# Patient Record
Sex: Female | Born: 1950 | Race: White | Hispanic: No | Smoking: Never smoker
Health system: Southern US, Community
[De-identification: ages and names within clinical notes are randomized; demographics above are authoritative.]

## PROBLEM LIST (undated history)

## (undated) DIAGNOSIS — E119 Type 2 diabetes mellitus without complications: Secondary | ICD-10-CM

## (undated) DIAGNOSIS — I10 Essential (primary) hypertension: Secondary | ICD-10-CM

## (undated) DIAGNOSIS — Z9889 Other specified postprocedural states: Secondary | ICD-10-CM

## (undated) DIAGNOSIS — T7840XA Allergy, unspecified, initial encounter: Secondary | ICD-10-CM

## (undated) DIAGNOSIS — I8390 Asymptomatic varicose veins of unspecified lower extremity: Secondary | ICD-10-CM

## (undated) DIAGNOSIS — M1990 Unspecified osteoarthritis, unspecified site: Secondary | ICD-10-CM

## (undated) DIAGNOSIS — E785 Hyperlipidemia, unspecified: Secondary | ICD-10-CM

## (undated) DIAGNOSIS — R51 Headache: Secondary | ICD-10-CM

## (undated) DIAGNOSIS — R5381 Other malaise: Secondary | ICD-10-CM

## (undated) DIAGNOSIS — G039 Meningitis, unspecified: Secondary | ICD-10-CM

## (undated) DIAGNOSIS — Z87440 Personal history of urinary (tract) infections: Secondary | ICD-10-CM

## (undated) DIAGNOSIS — R011 Cardiac murmur, unspecified: Secondary | ICD-10-CM

## (undated) DIAGNOSIS — IMO0001 Reserved for inherently not codable concepts without codable children: Secondary | ICD-10-CM

## (undated) DIAGNOSIS — J019 Acute sinusitis, unspecified: Secondary | ICD-10-CM

## (undated) DIAGNOSIS — R0602 Shortness of breath: Secondary | ICD-10-CM

## (undated) DIAGNOSIS — M4802 Spinal stenosis, cervical region: Secondary | ICD-10-CM

## (undated) DIAGNOSIS — J45909 Unspecified asthma, uncomplicated: Secondary | ICD-10-CM

## (undated) DIAGNOSIS — G709 Myoneural disorder, unspecified: Secondary | ICD-10-CM

## (undated) DIAGNOSIS — M549 Dorsalgia, unspecified: Secondary | ICD-10-CM

## (undated) DIAGNOSIS — F411 Generalized anxiety disorder: Secondary | ICD-10-CM

## (undated) DIAGNOSIS — Z8709 Personal history of other diseases of the respiratory system: Secondary | ICD-10-CM

## (undated) DIAGNOSIS — G9600 Cerebrospinal fluid leak, unspecified: Secondary | ICD-10-CM

## (undated) DIAGNOSIS — R29898 Other symptoms and signs involving the musculoskeletal system: Secondary | ICD-10-CM

## (undated) DIAGNOSIS — G971 Other reaction to spinal and lumbar puncture: Secondary | ICD-10-CM

## (undated) DIAGNOSIS — G8929 Other chronic pain: Secondary | ICD-10-CM

## (undated) DIAGNOSIS — R1310 Dysphagia, unspecified: Secondary | ICD-10-CM

## (undated) DIAGNOSIS — R112 Nausea with vomiting, unspecified: Secondary | ICD-10-CM

## (undated) DIAGNOSIS — I499 Cardiac arrhythmia, unspecified: Secondary | ICD-10-CM

## (undated) DIAGNOSIS — Z8489 Family history of other specified conditions: Secondary | ICD-10-CM

## (undated) DIAGNOSIS — C801 Malignant (primary) neoplasm, unspecified: Secondary | ICD-10-CM

## (undated) DIAGNOSIS — R5383 Other fatigue: Secondary | ICD-10-CM

## (undated) DIAGNOSIS — D649 Anemia, unspecified: Secondary | ICD-10-CM

## (undated) DIAGNOSIS — K219 Gastro-esophageal reflux disease without esophagitis: Secondary | ICD-10-CM

## (undated) DIAGNOSIS — Z5189 Encounter for other specified aftercare: Secondary | ICD-10-CM

## (undated) HISTORY — PX: APPENDECTOMY: SHX54

## (undated) HISTORY — PX: KNEE ARTHROSCOPY: SUR90

## (undated) HISTORY — DX: Headache: R51

## (undated) HISTORY — PX: TUBAL LIGATION: SHX77

## (undated) HISTORY — DX: Allergy, unspecified, initial encounter: T78.40XA

## (undated) HISTORY — PX: BACK SURGERY: SHX140

## (undated) HISTORY — DX: Acute sinusitis, unspecified: J01.90

## (undated) HISTORY — DX: Hyperlipidemia, unspecified: E78.5

## (undated) HISTORY — PX: CARPAL TUNNEL RELEASE: SHX101

## (undated) HISTORY — DX: Essential (primary) hypertension: I10

## (undated) HISTORY — DX: Generalized anxiety disorder: F41.1

## (undated) HISTORY — DX: Other malaise: R53.81

## (undated) HISTORY — PX: BREAST EXCISIONAL BIOPSY: SUR124

## (undated) HISTORY — PX: BREAST SURGERY: SHX581

## (undated) HISTORY — DX: Asymptomatic varicose veins of unspecified lower extremity: I83.90

## (undated) HISTORY — DX: Other fatigue: R53.83

---

## 1985-06-19 HISTORY — PX: ABDOMINAL HYSTERECTOMY: SHX81

## 1992-06-19 HISTORY — PX: CHOLECYSTECTOMY: SHX55

## 1998-01-01 ENCOUNTER — Ambulatory Visit (HOSPITAL_COMMUNITY): Admission: RE | Admit: 1998-01-01 | Discharge: 1998-01-01 | Payer: Self-pay | Admitting: Neurosurgery

## 1998-05-18 ENCOUNTER — Encounter: Payer: Self-pay | Admitting: Neurosurgery

## 1998-05-18 ENCOUNTER — Ambulatory Visit (HOSPITAL_COMMUNITY): Admission: RE | Admit: 1998-05-18 | Discharge: 1998-05-18 | Payer: Self-pay | Admitting: Neurosurgery

## 1998-06-19 HISTORY — PX: LUMBAR FUSION: SHX111

## 1999-03-08 ENCOUNTER — Encounter: Payer: Self-pay | Admitting: Neurosurgery

## 1999-03-08 ENCOUNTER — Ambulatory Visit (HOSPITAL_COMMUNITY): Admission: RE | Admit: 1999-03-08 | Discharge: 1999-03-08 | Payer: Self-pay | Admitting: Neurosurgery

## 1999-05-20 ENCOUNTER — Encounter: Payer: Self-pay | Admitting: Neurosurgery

## 1999-05-24 ENCOUNTER — Inpatient Hospital Stay (HOSPITAL_COMMUNITY): Admission: RE | Admit: 1999-05-24 | Discharge: 1999-05-31 | Payer: Self-pay | Admitting: Neurosurgery

## 1999-05-24 ENCOUNTER — Encounter: Payer: Self-pay | Admitting: Neurosurgery

## 1999-06-23 ENCOUNTER — Encounter: Admission: RE | Admit: 1999-06-23 | Discharge: 1999-06-23 | Payer: Self-pay | Admitting: Neurosurgery

## 1999-06-23 ENCOUNTER — Encounter: Payer: Self-pay | Admitting: Neurosurgery

## 1999-07-12 ENCOUNTER — Encounter: Admission: RE | Admit: 1999-07-12 | Discharge: 1999-07-12 | Payer: Self-pay | Admitting: Neurosurgery

## 1999-07-12 ENCOUNTER — Encounter: Payer: Self-pay | Admitting: Neurosurgery

## 1999-08-09 ENCOUNTER — Ambulatory Visit (HOSPITAL_COMMUNITY): Admission: RE | Admit: 1999-08-09 | Discharge: 1999-08-09 | Payer: Self-pay | Admitting: Neurosurgery

## 1999-08-29 ENCOUNTER — Encounter: Payer: Self-pay | Admitting: Neurosurgery

## 1999-08-29 ENCOUNTER — Encounter: Admission: RE | Admit: 1999-08-29 | Discharge: 1999-08-29 | Payer: Self-pay | Admitting: Neurosurgery

## 1999-10-18 ENCOUNTER — Encounter: Payer: Self-pay | Admitting: Neurosurgery

## 1999-10-18 ENCOUNTER — Ambulatory Visit (HOSPITAL_COMMUNITY): Admission: RE | Admit: 1999-10-18 | Discharge: 1999-10-18 | Payer: Self-pay | Admitting: Neurosurgery

## 1999-11-04 ENCOUNTER — Encounter: Payer: Self-pay | Admitting: Neurosurgery

## 1999-11-04 ENCOUNTER — Ambulatory Visit (HOSPITAL_COMMUNITY): Admission: RE | Admit: 1999-11-04 | Discharge: 1999-11-04 | Payer: Self-pay | Admitting: Neurosurgery

## 1999-12-02 ENCOUNTER — Ambulatory Visit (HOSPITAL_COMMUNITY): Admission: RE | Admit: 1999-12-02 | Discharge: 1999-12-02 | Payer: Self-pay | Admitting: Neurosurgery

## 1999-12-02 ENCOUNTER — Encounter: Payer: Self-pay | Admitting: Neurosurgery

## 1999-12-05 ENCOUNTER — Encounter: Admission: RE | Admit: 1999-12-05 | Discharge: 1999-12-05 | Payer: Self-pay | Admitting: Neurosurgery

## 1999-12-05 ENCOUNTER — Encounter: Payer: Self-pay | Admitting: Neurosurgery

## 2000-01-13 ENCOUNTER — Ambulatory Visit (HOSPITAL_COMMUNITY): Admission: RE | Admit: 2000-01-13 | Discharge: 2000-01-13 | Payer: Self-pay | Admitting: Neurosurgery

## 2000-01-13 ENCOUNTER — Encounter: Payer: Self-pay | Admitting: Neurosurgery

## 2000-08-27 ENCOUNTER — Encounter: Payer: Self-pay | Admitting: Neurosurgery

## 2000-08-27 ENCOUNTER — Ambulatory Visit (HOSPITAL_COMMUNITY): Admission: RE | Admit: 2000-08-27 | Discharge: 2000-08-27 | Payer: Self-pay | Admitting: Neurosurgery

## 2001-04-24 ENCOUNTER — Encounter: Payer: Self-pay | Admitting: Neurosurgery

## 2001-04-24 ENCOUNTER — Encounter: Admission: RE | Admit: 2001-04-24 | Discharge: 2001-04-24 | Payer: Self-pay | Admitting: Neurosurgery

## 2001-04-30 ENCOUNTER — Ambulatory Visit (HOSPITAL_COMMUNITY): Admission: RE | Admit: 2001-04-30 | Discharge: 2001-04-30 | Payer: Self-pay | Admitting: Neurosurgery

## 2001-04-30 ENCOUNTER — Encounter: Payer: Self-pay | Admitting: Neurosurgery

## 2001-12-17 ENCOUNTER — Encounter (HOSPITAL_COMMUNITY): Admission: RE | Admit: 2001-12-17 | Discharge: 2002-01-16 | Payer: Self-pay | Admitting: Neurosurgery

## 2002-01-15 ENCOUNTER — Encounter: Admission: RE | Admit: 2002-01-15 | Discharge: 2002-03-18 | Payer: Self-pay

## 2002-04-07 ENCOUNTER — Encounter: Admission: RE | Admit: 2002-04-07 | Discharge: 2002-07-06 | Payer: Self-pay

## 2002-08-14 ENCOUNTER — Encounter: Admission: RE | Admit: 2002-08-14 | Discharge: 2002-11-12 | Payer: Self-pay

## 2003-01-19 ENCOUNTER — Encounter
Admission: RE | Admit: 2003-01-19 | Discharge: 2003-04-19 | Payer: Self-pay | Admitting: Physical Medicine & Rehabilitation

## 2003-01-28 ENCOUNTER — Encounter: Payer: Self-pay | Admitting: Physical Medicine & Rehabilitation

## 2003-01-28 ENCOUNTER — Ambulatory Visit (HOSPITAL_COMMUNITY)
Admission: RE | Admit: 2003-01-28 | Discharge: 2003-01-28 | Payer: Self-pay | Admitting: Physical Medicine & Rehabilitation

## 2003-04-03 ENCOUNTER — Encounter: Payer: Self-pay | Admitting: Radiology

## 2003-04-03 ENCOUNTER — Encounter: Payer: Self-pay | Admitting: Neurosurgery

## 2003-04-03 ENCOUNTER — Encounter: Admission: RE | Admit: 2003-04-03 | Discharge: 2003-04-03 | Payer: Self-pay | Admitting: Neurosurgery

## 2003-06-03 ENCOUNTER — Ambulatory Visit (HOSPITAL_COMMUNITY): Admission: RE | Admit: 2003-06-03 | Discharge: 2003-06-03 | Payer: Self-pay | Admitting: Neurosurgery

## 2003-06-20 HISTORY — PX: CERVICAL DISC SURGERY: SHX588

## 2003-08-04 ENCOUNTER — Inpatient Hospital Stay (HOSPITAL_COMMUNITY): Admission: RE | Admit: 2003-08-04 | Discharge: 2003-08-05 | Payer: Self-pay | Admitting: Neurosurgery

## 2005-01-04 ENCOUNTER — Ambulatory Visit (HOSPITAL_COMMUNITY): Admission: RE | Admit: 2005-01-04 | Discharge: 2005-01-04 | Payer: Self-pay | Admitting: Neurosurgery

## 2005-06-19 HISTORY — PX: ANTERIOR FUSION CERVICAL SPINE: SUR626

## 2006-02-22 ENCOUNTER — Encounter: Admission: RE | Admit: 2006-02-22 | Discharge: 2006-02-22 | Payer: Self-pay | Admitting: General Surgery

## 2006-03-12 ENCOUNTER — Ambulatory Visit (HOSPITAL_COMMUNITY): Admission: RE | Admit: 2006-03-12 | Discharge: 2006-03-12 | Payer: Self-pay | Admitting: Neurosurgery

## 2006-05-25 ENCOUNTER — Inpatient Hospital Stay (HOSPITAL_COMMUNITY): Admission: RE | Admit: 2006-05-25 | Discharge: 2006-05-28 | Payer: Self-pay | Admitting: Neurosurgery

## 2006-08-21 ENCOUNTER — Encounter: Admission: RE | Admit: 2006-08-21 | Discharge: 2006-08-21 | Payer: Self-pay | Admitting: Internal Medicine

## 2007-01-15 ENCOUNTER — Encounter: Admission: RE | Admit: 2007-01-15 | Discharge: 2007-01-15 | Payer: Self-pay | Admitting: Neurosurgery

## 2007-01-17 ENCOUNTER — Encounter: Admission: RE | Admit: 2007-01-17 | Discharge: 2007-01-17 | Payer: Self-pay | Admitting: Neurosurgery

## 2007-01-21 ENCOUNTER — Encounter: Admission: RE | Admit: 2007-01-21 | Discharge: 2007-01-21 | Payer: Self-pay | Admitting: Interventional Radiology

## 2007-01-21 ENCOUNTER — Ambulatory Visit: Admission: RE | Admit: 2007-01-21 | Discharge: 2007-01-21 | Payer: Self-pay | Admitting: Neurosurgery

## 2007-01-21 ENCOUNTER — Encounter: Admission: RE | Admit: 2007-01-21 | Discharge: 2007-01-21 | Payer: Self-pay | Admitting: Neurosurgery

## 2007-01-22 ENCOUNTER — Encounter: Admission: RE | Admit: 2007-01-22 | Discharge: 2007-01-22 | Payer: Self-pay | Admitting: Interventional Radiology

## 2007-04-23 ENCOUNTER — Ambulatory Visit (HOSPITAL_COMMUNITY): Admission: RE | Admit: 2007-04-23 | Discharge: 2007-04-23 | Payer: Self-pay | Admitting: Neurosurgery

## 2007-08-22 ENCOUNTER — Encounter: Admission: RE | Admit: 2007-08-22 | Discharge: 2007-08-22 | Payer: Self-pay | Admitting: Internal Medicine

## 2008-06-19 HISTORY — PX: TENDON REPAIR: SHX5111

## 2008-07-01 ENCOUNTER — Ambulatory Visit (HOSPITAL_BASED_OUTPATIENT_CLINIC_OR_DEPARTMENT_OTHER): Admission: RE | Admit: 2008-07-01 | Discharge: 2008-07-01 | Payer: Self-pay | Admitting: Orthopedic Surgery

## 2008-08-27 ENCOUNTER — Encounter: Admission: RE | Admit: 2008-08-27 | Discharge: 2008-08-27 | Payer: Self-pay | Admitting: Internal Medicine

## 2008-10-28 ENCOUNTER — Encounter: Admission: RE | Admit: 2008-10-28 | Discharge: 2008-10-28 | Payer: Self-pay | Admitting: Neurosurgery

## 2009-02-24 ENCOUNTER — Ambulatory Visit (HOSPITAL_BASED_OUTPATIENT_CLINIC_OR_DEPARTMENT_OTHER): Admission: RE | Admit: 2009-02-24 | Discharge: 2009-02-25 | Payer: Self-pay | Admitting: Orthopedic Surgery

## 2009-04-08 ENCOUNTER — Emergency Department (HOSPITAL_COMMUNITY): Admission: EM | Admit: 2009-04-08 | Discharge: 2009-04-08 | Payer: Self-pay | Admitting: Emergency Medicine

## 2009-05-07 ENCOUNTER — Encounter: Admission: RE | Admit: 2009-05-07 | Discharge: 2009-05-07 | Payer: Self-pay | Admitting: Neurosurgery

## 2009-06-10 ENCOUNTER — Encounter: Admission: RE | Admit: 2009-06-10 | Discharge: 2009-06-10 | Payer: Self-pay | Admitting: Neurosurgery

## 2009-07-13 ENCOUNTER — Encounter: Admission: RE | Admit: 2009-07-13 | Discharge: 2009-07-13 | Payer: Self-pay | Admitting: Neurosurgery

## 2009-09-16 ENCOUNTER — Encounter: Admission: RE | Admit: 2009-09-16 | Discharge: 2009-09-16 | Payer: Self-pay | Admitting: Internal Medicine

## 2009-11-17 ENCOUNTER — Ambulatory Visit: Payer: Self-pay | Admitting: Vascular Surgery

## 2009-12-22 ENCOUNTER — Emergency Department (HOSPITAL_COMMUNITY): Admission: EM | Admit: 2009-12-22 | Discharge: 2009-12-22 | Payer: Self-pay | Admitting: Emergency Medicine

## 2010-02-24 HISTORY — PX: OTHER SURGICAL HISTORY: SHX169

## 2010-04-29 ENCOUNTER — Ambulatory Visit (HOSPITAL_COMMUNITY): Admission: RE | Admit: 2010-04-29 | Discharge: 2010-04-29 | Payer: Self-pay | Admitting: Neurosurgery

## 2010-05-19 HISTORY — PX: LUMBAR LAMINECTOMY: SHX95

## 2010-05-27 ENCOUNTER — Ambulatory Visit (HOSPITAL_COMMUNITY)
Admission: RE | Admit: 2010-05-27 | Discharge: 2010-05-27 | Payer: Self-pay | Source: Home / Self Care | Attending: Neurosurgery | Admitting: Neurosurgery

## 2010-06-01 ENCOUNTER — Inpatient Hospital Stay (HOSPITAL_COMMUNITY)
Admission: RE | Admit: 2010-06-01 | Discharge: 2010-06-04 | Payer: Self-pay | Source: Home / Self Care | Attending: Neurosurgery | Admitting: Neurosurgery

## 2010-06-10 ENCOUNTER — Inpatient Hospital Stay (HOSPITAL_COMMUNITY)
Admission: AD | Admit: 2010-06-10 | Discharge: 2010-06-18 | Payer: Self-pay | Source: Home / Self Care | Attending: Neurosurgery | Admitting: Neurosurgery

## 2010-07-09 ENCOUNTER — Encounter: Payer: Self-pay | Admitting: Orthopedic Surgery

## 2010-07-10 ENCOUNTER — Encounter: Payer: Self-pay | Admitting: Internal Medicine

## 2010-08-29 LAB — APTT: aPTT: 26 seconds (ref 24–37)

## 2010-08-29 LAB — WOUND CULTURE

## 2010-08-29 LAB — GRAM STAIN

## 2010-08-29 LAB — URINALYSIS, ROUTINE W REFLEX MICROSCOPIC
Bilirubin Urine: NEGATIVE
Glucose, UA: 250 mg/dL — AB
Ketones, ur: NEGATIVE mg/dL
Nitrite: NEGATIVE
Specific Gravity, Urine: 1.022 (ref 1.005–1.030)
pH: 6 (ref 5.0–8.0)

## 2010-08-29 LAB — BASIC METABOLIC PANEL
BUN: 9 mg/dL (ref 6–23)
Chloride: 101 mEq/L (ref 96–112)
Chloride: 104 mEq/L (ref 96–112)
Creatinine, Ser: 0.7 mg/dL (ref 0.4–1.2)
GFR calc Af Amer: >60 mL/min (ref >60)
GFR calc non Af Amer: >60 mL/min (ref >60)
GFR calc non Af Amer: >60 mL/min (ref >60)
Potassium: 2.9 mEq/L — ABNORMAL LOW (ref 3.5–5.1)

## 2010-08-29 LAB — CBC
HCT: 32.8 % — ABNORMAL LOW (ref 36.0–46.0)
HCT: 35.2 % — ABNORMAL LOW (ref 36.0–46.0)
Hemoglobin: 11.7 g/dL — ABNORMAL LOW (ref 12.0–15.0)
MCHC: 33.1 g/dL (ref 30.0–36.0)
MCHC: 33.2 g/dL (ref 30.0–36.0)
MCV: 92.9 fL (ref 78.0–100.0)
MCV: 95 fL (ref 78.0–100.0)
MCV: 95.1 fL (ref 78.0–100.0)
Platelets: 183 10*3/uL (ref 150–400)
Platelets: 189 10*3/uL (ref 150–400)
Platelets: 215 10*3/uL (ref 150–400)
RBC: 3.53 MIL/uL — ABNORMAL LOW (ref 3.87–5.11)
RDW: 12.2 % (ref 11.5–15.5)
RDW: 12.5 % (ref 11.5–15.5)
RDW: 12.5 % (ref 11.5–15.5)
RDW: 12.6 % (ref 11.5–15.5)
WBC: 12 10*3/uL — ABNORMAL HIGH (ref 4.0–10.5)
WBC: 15.6 10*3/uL — ABNORMAL HIGH (ref 4.0–10.5)
WBC: 17.2 10*3/uL — ABNORMAL HIGH (ref 4.0–10.5)

## 2010-08-29 LAB — URINE MICROSCOPIC-ADD ON

## 2010-08-29 LAB — URINE CULTURE: Culture  Setup Time: 201112250037

## 2010-08-29 LAB — PROTIME-INR: INR: 0.91 (ref 0.00–1.49)

## 2010-08-29 LAB — ANAEROBIC CULTURE

## 2010-08-30 ENCOUNTER — Other Ambulatory Visit: Payer: Self-pay | Admitting: Neurosurgery

## 2010-08-30 DIAGNOSIS — M5410 Radiculopathy, site unspecified: Secondary | ICD-10-CM

## 2010-08-30 DIAGNOSIS — M549 Dorsalgia, unspecified: Secondary | ICD-10-CM

## 2010-08-30 DIAGNOSIS — M25559 Pain in unspecified hip: Secondary | ICD-10-CM

## 2010-08-30 LAB — CBC
HCT: 38.2 % (ref 36.0–46.0)
Hemoglobin: 12.5 g/dL (ref 12.0–15.0)
MCV: 93.4 fL (ref 78.0–100.0)
RDW: 12.1 % (ref 11.5–15.5)
WBC: 7.9 10*3/uL (ref 4.0–10.5)

## 2010-08-30 LAB — BASIC METABOLIC PANEL
BUN: 10 mg/dL (ref 6–23)
Chloride: 101 mEq/L (ref 96–112)
GFR calc non Af Amer: >60 mL/min (ref >60)
Potassium: 4.2 mEq/L (ref 3.5–5.1)
Sodium: 137 mEq/L (ref 135–145)

## 2010-08-30 LAB — SURGICAL PCR SCREEN: MRSA, PCR: NEGATIVE

## 2010-08-31 ENCOUNTER — Ambulatory Visit
Admission: RE | Admit: 2010-08-31 | Discharge: 2010-08-31 | Disposition: A | Discharge status: Home / Self Care | Payer: Medicare Other | Source: Ambulatory Visit | Attending: Neurosurgery | Admitting: Neurosurgery

## 2010-08-31 DIAGNOSIS — M5410 Radiculopathy, site unspecified: Secondary | ICD-10-CM

## 2010-08-31 DIAGNOSIS — M25559 Pain in unspecified hip: Secondary | ICD-10-CM

## 2010-08-31 DIAGNOSIS — M549 Dorsalgia, unspecified: Secondary | ICD-10-CM

## 2010-08-31 LAB — BASIC METABOLIC PANEL
CO2: 29 mEq/L (ref 19–32)
Chloride: 104 mEq/L (ref 96–112)
GFR calc Af Amer: >60 mL/min (ref >60)
Potassium: 4 mEq/L (ref 3.5–5.1)
Sodium: 139 mEq/L (ref 135–145)

## 2010-08-31 LAB — CBC
HCT: 40.4 % (ref 36.0–46.0)
Hemoglobin: 13.7 g/dL (ref 12.0–15.0)
MCH: 31.5 pg (ref 26.0–34.0)
MCV: 92.9 fL (ref 78.0–100.0)
Platelets: 213 10*3/uL (ref 150–400)
RBC: 4.35 MIL/uL (ref 3.87–5.11)
WBC: 8.8 10*3/uL (ref 4.0–10.5)

## 2010-09-06 ENCOUNTER — Other Ambulatory Visit: Payer: Self-pay | Admitting: Neurosurgery

## 2010-09-06 DIAGNOSIS — M5410 Radiculopathy, site unspecified: Secondary | ICD-10-CM

## 2010-09-06 DIAGNOSIS — M549 Dorsalgia, unspecified: Secondary | ICD-10-CM

## 2010-09-07 ENCOUNTER — Ambulatory Visit
Admission: RE | Admit: 2010-09-07 | Discharge: 2010-09-07 | Disposition: A | Discharge status: Home / Self Care | Payer: Medicare Other | Source: Ambulatory Visit | Attending: Neurosurgery | Admitting: Neurosurgery

## 2010-09-07 ENCOUNTER — Other Ambulatory Visit: Payer: Self-pay | Admitting: Neurosurgery

## 2010-09-07 DIAGNOSIS — M5410 Radiculopathy, site unspecified: Secondary | ICD-10-CM

## 2010-09-07 DIAGNOSIS — M549 Dorsalgia, unspecified: Secondary | ICD-10-CM

## 2010-09-21 ENCOUNTER — Other Ambulatory Visit: Payer: Medicare Other

## 2010-09-23 LAB — POCT I-STAT, CHEM 8
BUN: 13 mg/dL (ref 6–23)
Calcium, Ion: 1.14 mmol/L (ref 1.12–1.32)
Chloride: 105 mEq/L (ref 96–112)
Creatinine, Ser: 0.6 mg/dL (ref 0.4–1.2)
Glucose, Bld: 100 mg/dL — ABNORMAL HIGH (ref 70–99)
HCT: 40 % (ref 36.0–46.0)
Hemoglobin: 13.6 g/dL (ref 12.0–15.0)
Potassium: 3.8 mEq/L (ref 3.5–5.1)
Sodium: 140 mEq/L (ref 135–145)
TCO2: 26 mmol/L (ref 0–100)

## 2010-10-03 LAB — BASIC METABOLIC PANEL
BUN: 17 mg/dL (ref 6–23)
CO2: 26 mEq/L (ref 19–32)
Calcium: 8.1 mg/dL — ABNORMAL LOW (ref 8.4–10.5)
Chloride: 102 mEq/L (ref 96–112)
Creatinine, Ser: 0.6 mg/dL (ref 0.4–1.2)
GFR calc Af Amer: >60 mL/min (ref >60)
GFR calc non Af Amer: >60 mL/min (ref >60)
Glucose, Bld: 131 mg/dL — ABNORMAL HIGH (ref 70–99)
Potassium: 4 mEq/L (ref 3.5–5.1)
Sodium: 135 mEq/L (ref 135–145)

## 2010-10-03 LAB — POCT HEMOGLOBIN-HEMACUE: Hemoglobin: 13.8 g/dL (ref 12.0–15.0)

## 2010-10-14 ENCOUNTER — Other Ambulatory Visit: Payer: Self-pay | Admitting: Internal Medicine

## 2010-10-14 DIAGNOSIS — Z1231 Encounter for screening mammogram for malignant neoplasm of breast: Secondary | ICD-10-CM

## 2010-10-20 ENCOUNTER — Ambulatory Visit
Admission: RE | Admit: 2010-10-20 | Discharge: 2010-10-20 | Disposition: A | Discharge status: Home / Self Care | Payer: Medicare Other | Source: Ambulatory Visit | Attending: Internal Medicine | Admitting: Internal Medicine

## 2010-10-20 DIAGNOSIS — Z1231 Encounter for screening mammogram for malignant neoplasm of breast: Secondary | ICD-10-CM

## 2010-11-01 NOTE — Procedures (Signed)
LOWER EXTREMITY VENOUS REFLUX EXAM   INDICATION:  Swelling/questionable venous insufficiency.   EXAM:  Using color-flow imaging and pulse Doppler spectral analysis, the  bilateral common femoral, superficial femoral, popliteal, posterior  tibial, greater and lesser saphenous veins are evaluated.  There is  evidence suggesting deep venous insufficiency in the left lower  extremity.   The left saphenofemoral junction is not competent with reflux of >500  milliseconds. The left GSV  is not competent with reflux of >500  milliseconds with the caliber as described below.   The bilateral proximal short saphenous veins demonstrate competency.   GSV Diameter (used if found to be incompetent only)                                            Right    Left  Proximal Greater Saphenous Vein           cm       1.17 cm  Proximal-to-mid-thigh                     cm       0.71 cm  Mid thigh                                 cm       0.68 cm  Mid-distal thigh                          cm       0.73 cm  Distal thigh                              cm       0.34 cm  Knee                                      cm       0.25 cm   IMPRESSION:  1. Left greater saphenous vein reflux with >500 milliseconds is      identified with the caliber ranging from 0.25 cm to 1.17 cm knee to      groin.  2. The left greater saphenous vein is not aneurysmal.  3. The left greater saphenous vein is tortuous.  4. The deep venous system is not competent with reflux of >500      milliseconds in left leg.  5. The bilateral lesser saphenous vein is competent.          ___________________________________________  Larina Earthly, M.D.   CB/MEDQ  D:  11/17/2009  T:  11/17/2009  Job:  [id number]

## 2010-11-01 NOTE — Consult Note (Signed)
NEW PATIENT CONSULTATION   [name] [name], [name] [name] F  DOB:  Dec 03, 1950                                       11/17/2009  GMWNU#:[id number]   The patient presents today for evaluation of lower extremity discomfort.  She is a 60 year old white female with bilateral lower extremity  complaints.  She reports that she has swelling and aching bilaterally.  This occurs throughout the day and can involve her at night as well.  She reports that she is having increasing swelling in both legs and  these are equal right and left.  She reports that if she walks for any  period, that her legs give out and she has to stop walking and again  she reports this is equal, right and left.  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  I explained  that it is difficult for me to think of a medical reason these would be  related.  She reports that initially she had pain over the varicosities  but over the past several years, this resolved and now does not have any  pain at this area.  Her main complaints continue to be an aching  sensation, a sensation of tightness and swelling in the lower  extremities.  She does not have any history of arterial insufficiency.  She did have a rule out DVT study done of which I have the results from  in April.  This did not address valvular incompetence.  She did undergo  repeat venous study today for evaluation of occlusive and valvular  incompetent disease.   PAST MEDICAL HISTORY:  Significant for hypertension.   SURGICAL HISTORY:  Positive for cervical and lumbar surgery,  cholecystectomy, prior hand surgery for a bone disease.   FAMILY HISTORY:  Negative for premature atherosclerotic disease.   SOCIAL HISTORY:  She is married with one child.  She is disabled.  She  does not smoke or drink alcohol.   REVIEW OF SYSTEMS:  No weight loss or weight gain.   Height is 5 feet 5  inches tall by report.  CARDIAC:  Positive for heart murmur and shortness breath with exertion.  PULMONARY:  Negative.  GI:  Hiatal hernia.  GU:  Urinary frequency.  VASCULAR:  Positive for history of phlebitis.  NEUROLOGIC:  Negative.  MUSCULOSKELETAL:  Positive for arthritis, joint pain and muscle pain.  PSYCHIATRIC, ENT, HEMATOLOGIC, SKIN:  Negative.   PHYSICAL EXAMINATION:  Well-developed, well-nourished white female  appearing stated age.  Blood pressure 140/90, pulse 72, respirations 18.  She is in no acute distress.  HEENT:  Normal.  Abdomen:  Moderately  obese, no masses.  Musculoskeletal:  No major deformities or cyanosis.  Neurologic:  No focal weakness or paresthesias.  Skin:  Without ulcers  or rashes.  She does have tributary varicosities in her medial left  proximal calf.  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   She underwent a venous repeat duplex in our office for valvular  incompetence.  This shows mild reflux in her left common femoral vein,  no deep venous reflux in all in the right.  Her left great saphenous  vein does have reflux extending into the varicosities in her calf.  I had a long discussion with the patient regarding these findings.  I  did explain that she does have saphenous vein incompetence with the  varicosities in the left but this does not explain the majority of her  symptoms which are equal bilaterally, related to fatigue, tightness and  swelling.  I explained that she could be treated with laser ablation of  her saphenous vein and removal of tributary varicosities but feel this  would be very unlikely to give her any improvement.  She is not having  pain specifically over the varicosities and therefore I do not feel we  would impact on her general bilateral swelling and tightness.  She  understands this and will see Dr. Olena Leatherwood.  She has had some relief with  diuretic in the past.  She also understands the  importance of elevation  when possible and also the importance of support hose and compression  garments.  She will see [place] again on an as-needed basis.     Larina Earthly, M.D.  Electronically Signed   TFE/MEDQ  D:  11/17/2009  T:  11/18/2009  Job:  [id number]   cc:   Hilda Lias, M.D.  Lia Hopping

## 2010-11-01 NOTE — Op Note (Signed)
NAME:  [name] [name], [name] [name]                ACCOUNT NO.:  [id number]   MEDICAL RECORD NO.:  [id number]          PATIENT TYPE:  AMB   LOCATION:  [facility]SC                          FACILITY:  [facility]   PHYSICIAN:  Matthew A. Weingold, M.D.DATE OF BIRTH:  September 01, 1950   DATE OF PROCEDURE:  07/01/2008  DATE OF DISCHARGE:                               OPERATIVE REPORT   PREOPERATIVE DIAGNOSES:  1. Left thumb carpometacarpal joint arthritis.  2. 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   POSTOPERATIVE DIAGNOSES:  1. Left thumb carpometacarpal joint arthritis.  2. 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   PROCEDURE:  Arthrodesis of left index and left long DIP joints using  micro accu check screws 16 and 18 mm as well as injection of left thumb  CMC joint.   SURGEON:  Artist Pais. Mina Marble, M.D.   ASSISTANT:  None.   ANESTHESIA:  General.   TOURNIQUET TIME:  One hour.   COMPLICATIONS:  None.   DRAINS:  None.   OPERATIVE REPORT:  The patient was taken to the operating suite.  After  induction of adequate general anesthesia, left upper extremity was  prepped and draped in the usual sterile fashion.  An Esmarch was used to  exsanguinate the limb and tourniquet was inflated to 250 mmHg.  At this  point in time, the left thumb CMC joint was injected under fluoroscopic  guidance using a 25-gauge needle with 1 mL of Celestone.  Next, dorsal  incisions were made over the DIP joints longitudinally index and long  finger.  Dissection __________ was split.  Dissection was carried down  to the joints.  The joint was decorticated.  Large dorsal osteophytes  and spurs were removed.  The remaining aspects of cartilage were removed  until opposing surfaces were even.  At this point in time, the index and  long finger distal interphalangeal joints were arthrodesed using an 18-  mm and 16-mm micro accu check screws.  The 18 was used on the  index and  the 16 on the long finger using standard technique.  Once this was done,  intraoperative fluoroscopy revealed good placement of the hardware in  good arthrodesis position.  The wounds were  irrigated and closed in layers of 4-0 Vicryl to reapproximate the  extensor tendon and 4-0 nylon on the skin.  Xeroform, 4 x 4s, and volar  splints were applied.  The patient tolerated the procedures well and  went to recovery room in stable fashion.      Artist Pais Mina Marble, M.D.  Electronically Signed     MAW/MEDQ  D:  07/01/2008  T:  07/02/2008  Job:  [id number]

## 2010-11-04 NOTE — Consult Note (Signed)
NAME:  [name] [name], [name] [name]                          ACCOUNT NO.:  [id number]   MEDICAL RECORD NO.:  [id number]                   PATIENT TYPE:  REC   LOCATION:                                       FACILITY:   PHYSICIAN:  Zachary George, DO                      DATE OF BIRTH:  1950/11/17   DATE OF CONSULTATION:  08/18/2002  DATE OF DISCHARGE:                                   CONSULTATION   HISTORY OF PRESENT ILLNESS:  [name] [name] returns to the clinic today for re-  evaluation. I last saw [name] [name] on April 23, 2002, at which time she  underwent a lumbar epidural steroid injection for chronic low back pain with  degenerative disk disease of the lumbar spine with bulging disk at L4-5 and  bilateral lower extremity radicular symptoms. She is status post lumbar  fusion at L2-3 and L3-4. She was unable to make it back to clinic as  scheduled following that injection secondary to multiple family issues, as  well as other medical concerns. The patient states that she had to get some  type of biopsy performed and that took a few weeks and then her sister-in-  law was in a severe motor vehicle accident and sustained traumatic brain  injury and she has been traveling to and from [place] to help out. She  states that the lumbar epidural steroid injection seemed to help her for a  few weeks only. This is similar to previous injections that she has had in  the past. Currently, her pain is a 7/10 on a subjective scale, involving her  lower back with right greater than left lower extremity radicular symptoms.  She also complains of right upper back pain which has been worse with all of  her social stressors. She has had trigger point injections in the past in  the right upper trapezius and levator scapulae which helped significantly.  She continues on Ultracet and Mobic per her primary care physician. Her  function and quality of life indices have declined. Her sleep is poor. She  is not currently taking  anything for sleep. I reviewed the health and  history form on a 14 point review of systems.   PHYSICAL EXAMINATION:  GENERAL: A healthy female in no acute distress.  VITAL SIGNS: Blood pressure 146/88, pulse 66, respiratory rate 24, O2 sat  100% on room air.  NEURO: Examination reveals significant tenderness with trigger points in the  right upper trapezium and levator scapulae muscles. This reproduces the  patient's upper extremity radicular component as well as pain radiating up  into her neck. Manual muscle testing is 5/5 bilateral upper and lower  extremities. Sensory examination is intact to light touch, bilateral upper  and lower extremities with the exception of very mild chronic decreased  light touch in the right anterolateral thigh. Muscle strength reflexes are  2+/4 bilateral triceps, biceps, brachial radialis, pronator terres, patellar  and medial hamstrings and Achilles. Spurling maneuver is equivocal on the  right and negative on the left. Straight leg raise negative bilaterally in  seated position.   IMPRESSION:  1. Myofascial pain syndrome with above noted trigger points.  2. Chronic low back pain, status post lumbar fusion at L2-3 and L3-4 with     bilateral lower extremity radicular symptoms.  3. 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  4. Non-obstructive sleep disorder.   PLAN:  1. I discussed the treatment options with [name] [name] at length. It is     reasonable at this time to proceed with repeat trigger point injections     to the right upper trapezius and levator scapulae muscles which have     helped her in the past. She wishes to proceed with this.  2. Will start Pamelor 10 mg 1-2 by mouth at bedtime, #60 with two refills,     to restore sleep capacity. The patient instructed to start with one pill     at bedtime for 3-4 days then may increased to two if needed.  3. Continue Mobic and Ultracet per primary care physician.  4.  Continue activity modification.  5. Consider using lumbosacral corset.  6. Consider MRI of the cervical spine if upper back and upper extremity     symptoms are not improved with trigger point injections.  7. Consider repeat lumbar steroid injection. The patient wishes to hold off     on this at this time.  8. The patient will return to the clinic in two weeks for re-evaluation.  9. Will consider repeat trigger point injections as per the patient's     response to symptoms.  10.      The patient was educated in the above findings and recommendations     and understands. There were no barriers to communication.                                                 Zachary George, DO    JW/MEDQ  D:  08/18/2002  T:  08/18/2002  Job:  [id number]

## 2010-11-04 NOTE — H&P (Signed)
[facility]  Patient:    [name] [name]                        MRN: [id number] Adm. Date:  [id number] Attending:  Danella Penton                         History and Physical  BRIEF HISTORY:  [name] [name] is a lady, who back in 1977, underwent right L2-3 herniated disc because of pain and radiculopathy.  The patient did fairly well ut for the past 1 year had been complaining of quite a bit of back pain, difficulty walking, still having some discomfort with her bladder and bowel.  The patient as had conservative treatment including physical therapy and use of a brace and has had some difficulty with the type of work she does and the pain gets worse. She is miserable.  We have followed her and we have several x-rays and indeed she is getting worsening of degenerative disc disease between L2-3 with some subluxation which does not ove between flexion/extension.  She also has a small incidental disc at the level of L5-1 and also a small synovial cyst at the level of L4-5.  Because of failure of conservative treatment we decided to proceed with surgery.  PAST MEDICAL HISTORY:  L2-3 diskectomy three years ago.  ALLERGIES:  The patient is not allergic to any medication.  FAMILY HISTORY:  Remarkable.  PHYSICAL EXAMINATION:  Head, ears, nose and throat normal.  NECK:  Normal. She has good flexibility.  LUNGS:  Clear.  HEART:  Heart sounds normal.  EXTREMITIES:  Normal extremities, normal pulses.  NEUROLOGIC:  Mental status normal.  Cranial nerves II-XII normal.  Strength is /5 except for some weakness in the iliopsoas and minimal in the quadriceps. Sensation seems to be grossly normal.  Reflexes are symmetrical with decrease of the left  quadriceps.  She is able to walk in a straight line.  She is able to walk up on her tiptoes and heels but she has quite a bit of difficulty squatting.  She has a decrease of flexibility of the  lumbar spine with a lot of the pain mostly at the level of thoracolumbar area.  The x-ray showed that indeed she had some degenerative disc disease at the level of 2-3 with some step-off between the vertebral body of 2-3.  She has also quite a bit of scar tissue.  CLINICAL IMPRESSION:  Chronic L2-3 degenerative disc disease with bilateral radiculopathy.  RECOMMENDATIONS:  I talked with the patient at length.  I showed the x-ray to my partner including Dr. ______.  Indeed, we are going to proceed with the L2-3 discectomy followed by interbody fusion, a posterolateral bone graft, and pedicle screws.  She knows that she is going to be wearing a brace for at least 3 months. The risks of cerebral surgery:  She is fully aware of infection, the need for further surgery, damage to the nerve, problem with bowel or bladder and no improvement whatsoever of the pain. DD:  05/24/99 TD:  05/24/99 Job: [id number]

## 2010-11-04 NOTE — Consult Note (Signed)
NAME:  [name] [name], [name] [name]                          ACCOUNT NO.:  [id number]   MEDICAL RECORD NO.:  [id number]                   PATIENT TYPE:  REC   LOCATION:  [facility]                                  FACILITY:  [facility]   PHYSICIAN:  [name] [name], D.O.                 DATE OF BIRTH:  12-25-1950   DATE OF CONSULTATION:  01/16/2002  DATE OF DISCHARGE:  01/16/2002                  PHYSICAL MEDICINE & REHABILITATION CONSULTATION   Dear Dr. Jeral [name]:   Thank you very much for kindly referring this [facility] for evaluation of low back pain.  Please refer  to the following for details regarding the history and physical examination  and treatment plan.  Once again, thank you for allowing [place] to participate in  the care of this patient.   CHIEF COMPLAINT:  Low back pain with lower extremity and hip pain.   HISTORY OF PRESENT ILLNESS:  The patient is a pleasant 60 year old right-  hand dominant female with a long history of low back pain primarily  radiating into her right lower extremity involving her lateral thigh and  posterior calf mainly but also occasionally her anterior and posterior thigh  as well.  She notes intermittent numbness and paresthesias in her right  lower extremity involving the entire lower extremity, especially after 15  minutes of driving.  She has undergone lumbar fusion at L2-3 in November  1997 for a herniated disk.  She then underwent fusion at L3-4 in December  2000 per Dr. Jeral [name].  She apparently fell in September 2002, which seemed to  make her symptoms worse.  She has tried physical therapy but is unable to  tolerate it.  She tries to perform home exercises but really without any  benefit.  She has undergone epidural steroid injections before and after her  last surgery.  Her last epidural injections were in the summer of 2001, per  her report.  She states that she got no relief with the epidural injections.  She had a  lumbar myelogram performed on April 30, 2001, which revealed  satisfactory fusion at L2-3 and L3-4, with a mild central disk bulge at L4-5  without stenosis or disk protrusion.  She has been treated with multiple  medications, many of which she cannot remember, but some have included Vioxx  and Wygesic per her primary care [name] [name].  She is currently taking Relafen  500 mg b.i.d. and Flexeril 10 mg t.i.d.  She denies any GI side effects with  Relafen.  She did not get any relief with Vioxx.  She also underwent an  orthopedic evaluation for bilateral hip pain and states that workup was  negative for any hip pathology.  She has also undergone electrodiagnostic  studies in her lower extremities which she reports as normal.  She denies  any bowel or bladder dysfunction.  Denies fevers, chills, night sweats,  weight loss, or night pain.  She does not like taking narcotic-based pain  medicine and has been seeking alternatives.  Her pain today is a 7/10 on a  subjective scale and described as achy, sharp, burning, stabbing, with  associated intermittent numbness.  Her symptoms are worse with walking and  sitting and improved with rest and medications to some degree.  Her function  and quality of life indices have declined.  Her sleep is poor.  I reviewed  the health and history form and 14-point review of systems.   PAST MEDICAL HISTORY:  1. Osteoporosis.  2. Hiatal hernia.  3. Polycystic kidney disease.  4. Nighttime leg cramps.   PAST SURGICAL HISTORY:  1. L2-3 and L3-4 fusion, as noted.  2. Cholecystectomy.  3. Hysterectomy.  4. Left knee surgery.  5. Carpal tunnel release.   FAMILY HISTORY:  Heart disease, lung disease, cancer, diabetes.   SOCIAL HISTORY:  Denies smoking or alcohol use.  She denies illicit drug  use.  She is married.  Is not currently working secondary to her back pain.  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   ALLERGIES:  Questionable allergy to ASPIRIN.  The patient has not had any  reaction to other nonsteroidal anti-inflammatory medications per her report.   MEDICATIONS:  1. Relafen 500 mg b.i.d.  2. Flexeril 10 mg t.i.d.  3. Quinine as needed.  4. Actonel.   PHYSICAL EXAMINATION:  GENERAL:  Healthy female in no acute distress.  VITAL SIGNS:  Blood pressure 118/56, pulse 83, respirations 16, O2  saturation is 99% on room air.  NEUROLOGIC:  The patient does have some discomfort when standing up from a  seated position.  Gait is essentially normal.  Examination of the back  reveals a vertical midline incisional scar.  There is a level pelvis with  increased lumbar lordosis.  No gross scoliosis noted.  There is guarded  range of motion in all planes, especially with flexion and rotation.  Very  light palpation elicits a significant pain response in the right greater  than left lumbar paraspinals and gluteal muscles.  Manual muscle testing is  5/5 bilateral lower extremities.  Sensory examination is intact to light  touch bilateral lower extremities in all dermatomal distributions at this  time.  Muscle stretch reflexes are 2+/4 bilateral patellar, medial  hamstrings, and Achilles.  Straight leg raise is negative bilaterally, but  right straight leg raise does cause some right lumbar pain.  [name] [name] is  negative bilaterally but with significantly tight hip flexors, right greater  than left.  The patient is also noted to have mildly tight hamstrings  bilaterally.  There is no heat, erythema, or edema in the lower extremities.   IMPRESSION:  1. Chronic low back pain status post lumbar fusion at L2-3 and L3-4 with     right lower extremity radicular pain, numbness, and paresthesias.  The     patient has a bulging disk at L4-5, but I question the clinical     significance.  Her symptoms potentially could be diskogenic in nature.    She also has significant myofascial  component.  2. Nonrestorative sleep disorder.   PLAN:  1. I had a long discussion with the patient regarding treatment options.     This was an extensive consultation of 45 minutes' duration.  We discussed     medications and interventional procedures.  2. Will begin a trial of Zonegran 100  mg daily at suppertime for neuropathic     component.  I supplied the patient with a 14-day sample pack plus a     prescription for #30 with one refill.  The patient is instructed to     contact our clinic if she is unable to tolerate the medicine.  If unable     to tolerate would consider switching to low-dose Neurontin or gabitril.  3. Will start Lidoderm 5% patches, applying each patch up to 12 hours per     day, #30 with one refill for back pain.  4. Will switch Relafen to Mobic 7.5 mg 1 p.o. q.d., #30 with one refill.  5. I encouraged the patient to get involved in an aquatic exercise program     at her local [employer].  This should help with her lumbar stabilization and     strengthening exercises and is generally more tolerable than land-based     PT.  This is something that she can do to help herself, and we discussed     this at length.  6. I instructed her on hip flexor stretching which may help relieve some of     her lumbar pain.  7. Consider adding Ultracet if no relief with the above-noted measures.  8. Consider local trigger point injections to her lumbar paraspinous region     versus repeat lumbar epidural steroid injections.  9. The patient is to return to the clinic in one month for reevaluation, or     sooner as needed.   The patient was educated on the above findings and recommendations and  understands.  There were no barriers to communication.                                               [name] [name], D.O.    JJW/MEDQ  D:  01/16/2002  T:  01/21/2002  Job:  [id number]   cc:   Tanya Nones. [name] Fruit, MD

## 2010-11-04 NOTE — Consult Note (Signed)
NAME:  [name] [name], [name] [name]                          ACCOUNT NO.:  [id number]   MEDICAL RECORD NO.:  [id number]                   PATIENT TYPE:  REC   LOCATION:  TPC                                  FACILITY:  [facility]   PHYSICIAN:  Sondra Come, D.O.                 DATE OF BIRTH:  1950/11/09   DATE OF CONSULTATION:  02/13/2002  DATE OF DISCHARGE:                                   CONSULTATION   The patient returns to clinic today as scheduled for reevaluation.  She was  initially seen on January 16, 2002. She continues to have significant low back  pain with lower extremity radicular symptoms primarily into the right  anterior thigh.  She is status post lumbar fusion at L2-3 and L3-4 with a  bulging disk at L4-5.  Her pain is a 6/10 on a subjective scale with  declined function and quality of life indexes.  She states that she had  minimal, if any, improvement with the Lidoderm patches.  She feels like the  Zonegran did not significantly help her radicular symptoms, but did make her  sleepy.  The switch to [facility] has helped to some degree and patient states  that her primary care physician has given her some Ultracet samples which  she states is somewhat helpful.  She is resistant to any type of narcotic  based pain medication at this point.  She is currently getting physical  therapy for her left knee pain and is status post knee surgery on the left.  Her orthopedist is trying to avoid repeat surgery.  She is having somewhat  of a difficult time in physical therapy secondary to her low back pain.  She  continues with an independent pool program.  I reviewed the health and  history form and 14 point review of systems.   PHYSICAL EXAMINATION:  GENERAL:  Healthy female in no acute distress.  VITAL SIGNS:  Blood pressure 129/74, pulse 76, respirations 18, O2  saturation 100% on room air.  BACK:  Significant tenderness to palpation bilateral lumbar paraspinals with  tightness and ropey texture.   Range of motion is guarded in all planes  secondary to pain.  NEUROLOGIC:  Manual muscle testing is 5/5 bilateral lower extremities.  Sensory examination reveals decreased light touch in the right anterolateral  thigh.  Muscle stretch reflexes are symmetric bilateral lower extremities.   IMPRESSION:  1. Chronic low back pain status post lumbar fusion at L2-3 and L3-4 with     right lower extremity radicular pain, numbness, and paraesthesias.  2. 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     I question the clinical significance of this.  In particular, I am     wondering whether or not her low back pain is somewhat discogenic in     etiology.  3. Myofascial pain component.  4. Nonrestorative  sleep disorder.   PLAN:  1. The patient and I discussed further treatment options at length.  This     was a 15 minute consultation.  I would like to increase her Zonegran to     100 mg two p.o. q.h.s.  The patient has recently refilled her     prescription and this should give her 14 days.  She is instructed to call     our clinic at that time for further directions.  Would consider     increasing to 300 mg as patient tolerates if symptoms are not improving.  2. Continue Mobik.  3. Continue Ultracet samples for pain as needed.  4. Continue with physical therapy and a home pool program.  5. Consider local trigger point injections to the lumbar paraspinal muscles     versus a trial of L5-S1 transforaminal epidural steroid injection for     presumed discogenic pain from L4-5.  6. Hold Lidoderm patch for now.  If patient feels like it had been helpful,     she is instructed to resume.  7. The patient is to return to clinic in one month for reevaluation.   The patient was educated on the above findings and recommendations and  understands.  No barriers to communication.                                               Sondra Come, D.O.    JJW/MEDQ  D:  02/13/2002   T:  02/13/2002  Job:  [id number]   cc:   Tanya Nones. Jeral Fruit, MD  [address]  [place], [place] [address]  Fax: [phone]

## 2010-11-04 NOTE — H&P (Signed)
NAME:  [name] [name], [name] [name]                          ACCOUNT NO.:  [id number]   MEDICAL RECORD NO.:  [id number]                   PATIENT TYPE:  INP   LOCATION:  NA                                   FACILITY:  [facility]   PHYSICIAN:  Hilda Lias, M.D.                DATE OF BIRTH:  1950/06/29   DATE OF ADMISSION:  DATE OF DISCHARGE:                                HISTORY & PHYSICAL   HISTORY OF PRESENT ILLNESS:  [name] [name] is a lady who underwent lumbar  diskectomy many years ago.  Now she has been coming to my office complaining  of neck pain going to her right arm, associated with numbness and tingling  sensation.  She was quite miserable.  She was scheduled to have surgery at  the end of 2004, but she developed some type of gastrointestinal problem,  and she __________  [facility].  Now she is coming back because the  pain is getting worse.  She wants to proceed with surgery.  We know by x-ray  that she has a large herniated disk at the level of 5-6, from center and  going to the right side, although she has also spondylosis at multiple  levels.   PAST MEDICAL HISTORY:  Lumbar diskectomy at the level 2-3, and also had  fusion soon after.   She is not allergic to any medications.   FAMILY HISTORY:  Unremarkable.   REVIEW OF SYSTEMS:  Positive for back pain, and pain down her neck radiating  to the right upper extremity.   PHYSICAL EXAMINATION:  HEAD, EARS, AND THROAT:  Normal.  NECK:  She is able to flex, extension, lateral rotation with posterior  discomfort.  LUNGS:  Clear.  HEART:  Heart sounds normal.  EXTREMITIES:  Normal extremities, normal pulses.  BACK:  There is a scar in the midline in the lumbar area from previous  surgery.  NEUROLOGIC:  Mental status normal.  Cranial nerves normal.  Strength:  I can  break easily the right biceps and the wrist extensors.  She complained of  numbness and tingling sensation going to the thumb and index finger.  Face  is symmetrical  with absent on the right biceps.  Coordination and gait  normal.   X-RAY:  She had multiple levels of spondylosis at the level of 5-6.  She has  a large herniated disk centered to the right.   CLINICAL IMPRESSION:  C5-C6 herniated disk.   RECOMMENDATIONS:  The patient will be admitted for surgery, and the  procedure will be one level anterior cervical diskectomy at 5-6 following  bone graft and a plate.  She knows all the risks such as infection, CSF  leak, worsening pain, paralysis, damage to __________  , damage to  __________  , damage to vertebral artery, and need for further surgery.  Hilda Lias, M.D.    EB/MEDQ  D:  08/04/2003  T:  08/04/2003  Job:  [id number]

## 2010-11-04 NOTE — Consult Note (Signed)
NAME:  [name] [name], [name] [name]                          ACCOUNT NO.:  [id number]   MEDICAL RECORD NO.:  [id number]                   PATIENT TYPE:  REC   LOCATION:  TPC                                  FACILITY:  [facility]   PHYSICIAN:  Sondra Come, D.O.                 DATE OF BIRTH:  07-04-1950   DATE OF CONSULTATION:  04/14/2002  DATE OF DISCHARGE:                                   CONSULTATION   The patient returns to clinic today for reevaluation.  She was last seen on  03/14/2002.  At that time, she underwent trigger point injections to her  right upper trapezius and cleidoscapular muscles which helped significantly.  She still gets occasional discomfort in her right upper back and  parascapular muscles but to a significantly lesser degree.  She does not  feel like she needs repeat injections at this point.  She still continues to  complain of low back pain radiating primarily to her right lower extremity  in an L5-S1 distribution.  She notes that yesterday she began experiencing  some intermittent numbness and paraesthesias in the left lower extremity as  well.  She has not previously had any radicular symptoms in her left lower  extremity prior to yesterday.  She continues to take Mobic and Ultracet as  needed.  She has been resistant to trying a long-acting opiate-based pain  medication.  We have been considering some other options including  interventional approaches to address potential diskogenic etiology of her  pain.  Her pain today is an 8/10 on a subjective scale.  Functional and  quality of life indices remained declined.  Her sleep is poor.  I reviewed  Health and History form and 14-point Review of Systems.  She continues with  home-based therapy program.   PHYSICAL EXAMINATION:  VITAL SIGNS:  Blood pressure 138/56, pulse 95,  respirations 20, O2 saturation 97% on room air.  MUSCULOSKELETAL:  There is tenderness to palpation bilateral lumbar  paraspinal muscles with some  tightness.  Range of motion is limited in all  planes.  Manual muscle testing reveals 5/5 strength bilateral lower  extremities.  Sensory examination is intact to light touch bilateral lower  extremities at this time.  Muscle stretch reflexes are symmetric bilateral  lower extremities with the exception of left patellar which is not tested  today secondary to knee pain.   IMPRESSION:  1. Chronic low back pain status post lumbar fusion at L2-3 and L3-4 with     right lower extremity radicular pain, numbness, and paresthesias     primarily, now with mild radicular symptoms on the left.  2. Degenerative disk disease of the lumbar spine with evulsion disk at L4-5.     Questionable clinical significance of the disk bulge.  Her pain may be     related primarily to the disk.  Would also consider lumbar facet joints  as potential pain generators as well.  3. Myofascial component.  4. Nonrestorative sleep disorder.   PLAN:  1. The patient and I discussed further treatment options.  At this point, it     is reasonable to proceed with a right L5-S1 transforaminal epidural     steroid injection to address the potential diskogenic component at L4-5.     I discussed the risks including bleeding, infection, nerve injury, spinal     headache, paralysis; benefits including relief of pain; limitations     including failure to relieve pain; and alternatives to include continued     medications, therapy, and possibly even physical modalities. The patient     wishes to proceed with the injection and understands the risks outlined.  2.     Continue Mobic and Ultracet as needed.  3. The patient is to return to clinic for transforaminal epidural steroid     injection.   The patient was educated about findings and recommendations and understands.  No barriers to communication.                                               Sondra Come, D.O.    JJW/MEDQ  D:  04/14/2002  T:  04/14/2002  Job:   [id number]   cc:   Hilda Lias, MD  [address]  [place], [place] [address]  Fax: [phone]

## 2010-11-04 NOTE — Op Note (Signed)
. [facility]  Patient:    [name] [name]                        MRN: [id number] Proc. Date: 05/24/99 Adm. Date:  [id number] Attending:  Danella Penton                           Operative Report  PREOPERATIVE DIAGNOSIS:  L2-3 degenerative disk disease, bilateral L3 radiculopathy, and subluxation between L3-4.  POSTOPERATIVE DIAGNOSIS:  L2-3 degenerative disk disease, bilateral L3 radiculopathy, and subluxation between L3-4.  PROCEDURE:  Bilateral L2-3 and L3-4 diskectomy, bone dowel graft of 9 mm between L2 and L3, and 12 mm between L3 and L4, pedicle screw from L2, L3 and L4 bilaterally, posterolateral fusion with allograft and Grafton.  MICROSCOPE:  Midas Rex.  SURGEON:  Tanya Nones. Jeral Fruit, M.D.  ASSISTANT:  Stefani Dama, M.D.  CLINICAL HISTORY:  Ms. Fetterly is a lady who is 60 years old with a history of back pain who underwent right L2-3 diskectomy almost four years ago.  Later, she had  been complaining of back pain and I have been following her since then.  What I see is that she had been getting worse at the level of L2-3 with already narrowing. The x-ray showed a bad case of degenerative disk disease between L2-3.  The lateral  x-ray with flexion and extension showed as well, subluxation between L3 and L4.  Surgery was advised.  The idea was to go ahead and do only the procedure at the  level of l2-3 but, we explain below, we had to include L3-4.  The patient knew f the risks, especially with the history of osteoporosis at the age of 60, such as poor uptake of the bone graft, poor acceptance of the pedicle screws.  Also, she knew about infection, CSF leak, need for further surgery.  DESCRIPTION OF PROCEDURE:  The patient was taken to the OR, and she was positioned in a prone manner.  The previous scar tissue was removed in the lumbar area. Muscle and fascia were retracted laterally.  We had to take an x-ray  and indeed, we were able to localize the spinal process of L2.  From then on, we identified L2-3 and L3-4.  At the level of L2-3 on the right side, we found quite a bit of scar  tissue and it was quite difficult to do the removal scar tissue.  Finally, at the end, we had ________ decompression bilaterally.  The same procedure was done at  the level of L3-4.  We proceeded with laminectomy of L3.  Then, we identified the L2-3 space and L3-4.  During this stage of the procedure, we found that the facet between L3 and L4 were not really solid, and there was quite a bit of subluxation when we tried to pull the spinous process.  Because the procedure was to be done at the level of L2-3, we were concerned that later on this lady could develop worsening of the subluxation between L3 and L4, and that is the main reason why we included L3-4.  Finally, we were able to do dissection.  We localized the area f L2-3.  The disk was really narrow.  An incision was made and we had to use quite a bit of microcurets and dissectors before we were able to do a total diskectomy.  This was done with the  microscope.  There was an area where previously she had  CSF leak, which we had to repair using Ethicon suture.  Having a good decompression, this procedure was done bilaterally, and having a good diskectomy. The same procedure was done at the level of L3-4, where a bilateral diskectomy as also achieved.  Along the line, we found a lot of scar tissue and a lot of bleeding.  Cell-Saver was used, but nevertheless, the bleeding was quite copious and difficult to get hold of.  Finally, having good hemostasis and having good decompression of both L2-3 and L3-4, we were able to remove the endplates at those two levels.  We measured the area and we were able to insert two bone grafts, dowel at the level of L2-3. The same procedure was done by using #12 graft at the level of L3-4.  Plenty  of decompression was done for the nerve root of L2, L3 and L4.  They were identified and we went laterally, identified the facet and the _______ for the pedicles. This was done easily up to the point that there was no need to do any drill. We were able to push ourself into the pedicle without any major instrument.  That really gave [place] the notion that indeed, this lady has quite a bad case of osteoporosis.  Then, a Synthes pedicle screw of 40 mm length was inserted at the level of L2, 3, and L4.  X-ray showed good position of the pedicle screws.  Then, a rod was inserted and the caps were applied to the end.  Compression was done.  At the end, we had a good solid fusion at those levels.  Hemostasis was done.  Just to be sure to avoid any CSF leak, we used Tissell in the epidural space. rom then on, the area was irrigated and the wound was closed with Vicryl and nylon.  The patient did really well. DD:  05/24/99 TD:  05/26/99 Job: [id number]

## 2010-11-04 NOTE — Consult Note (Signed)
NAME:  [name] [name], [name] [name]                          ACCOUNT NO.:  [id number]   MEDICAL RECORD NO.:  [id number]                   PATIENT TYPE:  REC   LOCATION:  TPC                                  FACILITY:  [facility]   PHYSICIAN:  Sondra Come, D.O.                 DATE OF BIRTH:  1950-08-10   DATE OF CONSULTATION:  04/23/2002  DATE OF DISCHARGE:                                   CONSULTATION   HISTORY OF PRESENT ILLNESS:  Ms. Ringer returns to clinic today as scheduled  for a lumbar epidural steroid injection. She has chronic low back pain  status post lumbar fusion at L2-3 and L3-4 and complains today not only of  right lower extremity radicular pain but also left lower extremity radicular  symptoms which seem to be worsening since last visit. She has degenerative  disk changes at L4-5 with a disk bulge, and I suspect some of her symptoms  are secondary to diskogenic pain. I had originally planned on performing a  right L5-S1 transforaminal epidural injection; however, as she describes  bilateral lower extremity symptoms, we will proceed with an intralaminar  epidural injection at L5-S1. I discussed this with the patient in detail  including the risks, benefits, limitations, and alternatives of the  injection, and she wishes to proceed. Her pain today is a 7/10 on a  subjective scale. No bowel or bladder dysfunction. I reviewed the health and  history form and 14-point review of systems.   PHYSICAL EXAMINATION:  Reveals a healthy female in no acute distress. Blood  pressure is 143/67, pulse 71, respirations 18, O2 saturations is 98% on room  air.   IMPRESSION:  1. Chronic low back pain status post lumbar fusion at L2-3 and L3-4 with     bilateral lower extremity radicular symptoms in the L5-S1 distribution.  2. Degenerative disk disease of the lumbar spine with bulging disk at L4-5     and likely diskogenic pain.  3. Myofascial component.  4. Nonrestorative sleep disorder.   PLAN:  1. Lumbar epidural steroid injection.  2. The patient to return to clinic in two weeks for reevaluation and     consideration for repeat injection as predicated upon patient's response.     Would consider bilateral transforaminal approach if symptoms are not     improved.   PROCEDURE:  Lumbar epidural steroid injection. The procedure was described  to patient in detail including risks, benefits, limitations, and  alternatives. The patient wishes to proceed. Informed consent was obtained.  The patient was brought back to the fluoroscopy suite and placed on the  table in prone position. Skin was prepped and draped in usual sterile  fashion. Skin and subcutaneous tissues were anesthetized with 3 cc of  preservative free 1% lidocaine. Under direct fluoroscopic guidance, an 18-  gauge, 3-1/5-inch Hustead needle was advanced into the right paramedian L5-  S1  epidural space with loss-of-resistance technique. There was no CSF, heme,  or paresthesia noted. This was then followed by the injection of 1.5 cc of  Kenalog 40 mg per cc plus 2.5 cc of normal saline with needle flush. There  were no complications. The patient tolerated the procedure well. Discharge  instructions were given. Post injection pain score is 4/10 on a subjective  scale. The patient was released in stable condition.   The patient was educated about findings and recommendations and understands.  There were no barriers to communication.                                                Sondra Come, D.O.    JJW/MEDQ  D:  04/23/2002  T:  04/24/2002  Job:  [id number]   cc:   Hilda Lias, M.D.  [address]  [place], [place] [address]  Fax: [phone]

## 2010-11-04 NOTE — H&P (Signed)
[facility]  Patient:    [name] [name], [name] [name]                       MRN: [id number] Adm. Date:  [id number] Disc. Date: [id number] Attending:  Danella Penton                         History and Physical  PREOPERATIVE DIAGNOSIS:  Right carpal tunnel syndrome.  POSTOPERATIVE DIAGNOSIS:  Right carpal tunnel syndrome.  PROCEDURE:  Decompression of the right median nerve.  ANESTHESIA:  Local plus IV sedation.  SURGEON:  Tanya Nones. Jeral Fruit, M.D.  INDICATIONS:  Ms. Mcgourty is a lady who I have been seeing for almost seven years.  She last year underwent fusion at the level of L2-3.  She had been complaining f pain in the right hand associated with numbness and weakness.  Nerve conduction  velocity was positive for a right carpal tunnel syndrome.  She is being admitted for surgery.  She knows all the risks such as infection, buildup of scar tissue, need for further surgery, and no resolution of the pain or weakness.  DESCRIPTION OF PROCEDURE:  The patient was taken to the operating room and the right hand was sprayed with Betadine.  Infiltration with Xylocaine 1% was made along the base of the thumb.  Then intravenous sedation was given.  Incision along the base of the thumb through the skin, volar ligament, and through a thick carpal ligament was made.  Protection of the median nerve was done and incision was carried out in the ulnar aspect of the nerve.  This was done proximal and distal. We found that the nerve was swollen and reddish.  The ligament was thick. Retraction was made.  Hemostasis was done with bipolar.  Having a good decompression proximal and distal, the area was irrigated and closed with nylon. The patient did well. DD:  08/09/99 TD:  08/09/99 Job: [id number]

## 2010-11-04 NOTE — Consult Note (Signed)
NAME:  [name] [name], [name] [name]                          ACCOUNT NO.:  [id number]   MEDICAL RECORD NO.:  [id number]                   PATIENT TYPE:  REC   LOCATION:  [facility]PC                                  FACILITY:  [facility]   PHYSICIAN:  Zachary George, DO                      DATE OF BIRTH:  02/07/1951   DATE OF CONSULTATION:  10/02/2002  DATE OF DISCHARGE:                                   CONSULTATION   SUBJECTIVE:  The patient returns to clinic today for reevaluation.  She was  last seen on 09/18/2002, at which time she underwent trigger point  injections to the right upper trapezius and levator scapular muscles, which  she states helped with her upper back pain temporarily.  It also helped  improve the right upper extremity radicular symptoms.  She describes most of  her pain today in her upper trapezius and periscapular region.  She also  notes some pain in her shoulder, which has been mild.  She has been going to  physical therapy, which she states it has been so-so.  She continues on  Relafen 500 mg twice per day without any further improvement in her lower  back pain.  I reviewed health and history form and 14-point review of  systems.  Pain today is 6/10 on subjective scale.  Function-and-quality-of-  life indices remain declined.  She continues on Ultracet four times per day  as needed with minimal improvement of her symptoms.  I reviewed health and  history form and 14-point review of systems.  Patient does note intermittent  paresthesias in her right upper extremity, primarily over her deltoid  region.   PHYSICAL EXAMINATION:  GENERAL:  Reveals a healthy female in no acute  distress.  VITAL SIGNS:  Blood pressure 137/85, pulse 89, respirations 20, O2  saturation is 98% on room air.  NEUROLOGIC:  Manual muscle testing is 5/5, bilateral upper extremities.  Sensory examination is intact to light touch, bilateral upper extremities at  this time.  Muscle stretch reflexes are 2+/4, bilateral  biceps, triceps,  brachial radialis, pronator teres.  Palpatory examination reveals active  trigger points in the right upper trapezius and levator scapular muscles,  which reproduces patient's right upper extremity radicular symptoms.  Examination of the right shoulder reveals full range of motion with  minimally positive Neer, Hawkins, empty can tests; the empty can test  primarily causes pain in the upper trapezius, however.   IMPRESSION:  1. Myofascial pain syndrome.  2. Cervicalgia, cannot conclusively rule out nerve root irritation, given     patient's symptoms, although physical examination suggests myofascial     pain syndrome, as I am able to reproduce her upper extremity radicular     symptoms with direct palpation to the myofascial trigger points.  3. Rotator cuff syndrome, right, very mild.  4. 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   PLAN:  1. Discussed further treatment options with the patient.  I think it is     reasonable to proceed with repeat trigger point injections, as these have     been helpful for her.  If symptoms are not improving, would consider     reevaluation and possible subacromial steroid injection diagnostically     and therapeutically as well as possible MRI of the cervical spine to rule     out neural impingement.  2. Patient to continue with Relafen and Ultracet.  3. Patient to return to clinic in one month for reevaluation.   PROCEDURE:  Trigger point injections, right upper trapezius and levator  scapular muscles.   Procedure was described to patient in detail, including the risks, benefits,  limitations, alternatives, and potential side effects.  Risks include, but  are not limited to, bleeding, infection, pneumothorax, nerve injury, failure  to relieve pain, increased pain, and allergic reaction to medications.  Patient understands and wishes to proceed.  Informed consent was obtained.   Skin was prepped in  usual fashion with alcohol swabs.  Each trigger point  was injected with 0.75 cc of 1% lidocaine plus 0.75 cc of 0.25% bupivacaine  using a 25-gauge 1-1/2-inch needle.  Patient tolerated the procedure well.  There were no complications.  Discharge instructions were given.  Patient  was released in stable condition.   Patient was educated in the above findings and recommendations and  understands.  There were no barriers to communication.                                               Zachary George, DO    JW/MEDQ  D:  10/02/2002  T:  10/02/2002  Job:  [id number]

## 2010-11-04 NOTE — Consult Note (Signed)
NAME:  [name] [name], [name] [name]                          ACCOUNT NO.:  [id number]   MEDICAL RECORD NO.:  [id number]                   PATIENT TYPE:  REC   LOCATION:  [facility]                                  FACILITY:  [facility]   PHYSICIAN:  Sondra Come, D.O.                 DATE OF BIRTH:  1950-12-17   DATE OF CONSULTATION:  03/14/2002  DATE OF DISCHARGE:                                   CONSULTATION   REASON FOR CONSULTATION:  The patient returns to clinic today as scheduled  for re-evaluation.  She was last seen on 02/13/02.  She continues to complain  of significant low back pain radiating into her right lower extremity  involving mainly the anterior thigh.  Her pain today is a 7/10 on a  subjective scale.  She has had some mild improvement with Ultracet and  Mobic, but does not feel like Zonegran has helped her radicular symptoms at  all.  She does have significant financial concerns, as she pays out of  pocket for her medications.  We discussed decreasing the Zonegran to 100 mg  q.d. for four days, and then discontinue, as she has four pills remaining.  She also complains of pain in her right upper back, pointing to her upper  trapezius, which she states radiates up into her neck.  Her function and  quality of life indices remain declined.  Her sleep is poor.  I reviewed  health and history form and 14 point review of systems.  The patient and I  discussed further treatment options to include possibly adding a  neuromuscular stimulator such as that through [employer] called a Micro-Z  stimulator, however, she understands that this would be an out of pocket  expense most likely.  We also discussed further medication management and  still she is resistant to opiate based pain medication.   PHYSICAL EXAMINATION:  GENERAL:  A healthy female in no acute distress.  VITAL SIGNS:  Blood pressure 139/85, pulse 79, respirations 20, O2  saturation 97% on room air.  BACK : Tenderness to palpation  bilateral lumbar paraspinal muscles with  decreased range of motion secondary to guarding.  Palpatory examination also  reveals an active trigger point in the right upper trapezius and levator  scapula muscles.  Manual muscle testing is 5/5 bilateral upper and lower  extremities with the exception of 4/5 right grip strength which is chronic  secondary to carpal tunnel syndrome.  Sensory examination is intact to light  touch bilateral upper and lower extremities at this time.  Muscle stretch  reflexes are 2+/4 bilateral patellar, medial hamstrings, Achilles, triceps,  biceps, brachial radialis, and pronator teres.  Spurling maneuver is  negative on the right.   IMPRESSION:  1. Myofascial pain, right upper back, involving the right upper trapezius     and levator scapula muscles.  2. Chronic low back pain, status post  lumbar fusion at L2-3 and L3-4 with     right lower extremity radicular pain, numbness, and paresthesias.  3. Degenerative disk disease of the lumbar spine with bulging disk at L4-5     with questionable clinical significance.  Her current increased low back     pain may be secondary to discogenic etiology.  4. Non-restorative sleep disorder.   PLAN:  1. Trigger point injections to the right upper trapezius and levator     scapula.  2. Wean Zonegran as noted.  3. Continue Ultracet and Mobic.  The patient has plenty of sample pills.  4. Consider transforaminal lumbar epidural steroid injection at L5-S1 to     address the presume diskogenic pain at L4-5.  The patient states that     when she had an epidural steroid injection previously she had some type     of allergic reaction to the solution, but is unsure of what it was.  I     have asked her to call our clinic and let us know.  5. The patient is to return to clinic in one month for re-evaluation or     sooner as needed.   The patient was educated on the above findings and recommendations and  understands.  There were  no barriers to communication.                                                Sondra Come, D.O.    JJW/MEDQ  D:  03/14/2002  T:  03/14/2002  Job:  [id number]   cc:   Tanya Nones. Jeral Fruit, M.D.  [address]  [place], [place] [address]  Fax: [phone]

## 2010-11-04 NOTE — Op Note (Signed)
[name] [name], [name] [name]                ACCOUNT NO.:  [id number]   MEDICAL RECORD NO.:  [id number]          PATIENT TYPE:  INP   LOCATION:  3012                         FACILITY:  [facility]   PHYSICIAN:  Hilda Lias, M.D.   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   PROCEDURE:  C5-C6 posterior fusion with lateral-mass screws, allograft  and bone morphogenic protein, C-arm.   SURGEON:  Hilda Lias, M.D.   CLINICAL HISTORY:  Mrs. Mahaffy is a lady, whom I have been following for  many years.  She had back fusion, and also anterior cervical fusion.  She developed a false joint at the level of L5-6.  The patient was  wearing an Aspen brace and the pain was better.  The x-rays showed that  she had a pseudoarthrosis at the level of L5-6.  Surgery was advised.  The risks were explained to her, including possibility of paralysis,  damage to the nerves, damage to the vertebral artery, and also the  possibility of the screws pulling out because of her osteoporosis.   DESCRIPTION OF PROCEDURE:  The patient was taken to the OR and she was  positioned in a prone manner.  A 3-pin headholder was used.  The neck  was prepped with Betadine.  A midline incision was made and muscles were  retracted laterally.  With C-arm, we were able to localize the 5 and 6  spinous processes.  We went laterally and we dissected the muscles all  the way until we were able to see the lateral aspect of the facet.  Then, we identified the facet and we selected the point, which was the  inferomedial quadrant.  A 45-degree angle was used and the facet was  drilled.  A tap was used, with the use of 4 screws of 14 length.  This  was followed by the C-arm, which showed good position of the screws.  Then, at the left,  although we were right inside, we could see that the  pedicle screws, although they were in place, there was a little bit of  movement, probably secondary to her osteoporosis.  The screws were  secured in place using a rod and caps.  Then, we drilled the facet and  the medial aspect of the spinous process and the lamina, and a mix of  allograft and BMP was used.  From then, the area was irrigated.  Hemostasis was done with bipolar, and the wound was closed with Vicryl  and Steri-Strips.           ______________________________  Hilda Lias, M.D.     EB/MEDQ  D:  05/25/2006  T:  05/26/2006  Job:  [id number]

## 2010-11-04 NOTE — Discharge Summary (Signed)
[facility]  Patient:    [name] [name]                        MRN: [id number] Adm. Date:  [id number] Disc. Date: 05/31/99 Attending:  Danella Penton                           Discharge Summary  ADMITTING DIAGNOSES: 1. 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 2. 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 3. Bilateral coagulopathy.  FINAL DIAGNOSES: 1. 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 2. 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 3. Bilateral coagulopathy.  HISTORY OF PRESENT ILLNESS:  The patient was admitted because of back pain with  radiation down to both legs.  In the past, this lady underwent an L2-3 diskectomy. The patient is getting worse.  We have been following her for almost three years. We can see that the area between 2 and 3 is getting worse, with stenosis of the  foramen.  Also, she has a subluxation between 3 and 4.  At the beginning, it was advised to have only L2-3 diskectomy with fusion, but during surgery we found instability of 3-4, and we involved 3-4 in the procedure.  LABORATORY DATA:  Normal.  HOSPITAL COURSE:  The patient was taken to surgery, and a bilateral L2-3, 3-4 diskectomy with interbody fusion and pedicle screw was done.  After surgery, the patient had the normal postoperative pain.  She was really slow to get around, nd she needed quite a bit of pain medication, including intravenous morphine.  The  patient had been seen by physical therapy and later she was getting more active. She is afebrile right now.  She is going to be discharged, to be followed by me in my office.  CONDITION ON DISCHARGE:  Improvement.  DISCHARGE MEDICATIONS:  OxyContin, Percocet, and diazepam.  DIET:  Regular.  ACTIVITY:  She is not to drive.  She is not to do any lifting.  FOLLOW-UP:  To be seen by me next week to have the stitches out. DD:  05/31/99 TD:  05/31/99 Job: [id number]

## 2010-11-04 NOTE — H&P (Signed)
NAMEHYDIE, [name] [name]                ACCOUNT NO.:  [id number]   MEDICAL RECORD NO.:  [id number]          PATIENT TYPE:  INP   LOCATION:  3012                         FACILITY:  [facility]   PHYSICIAN:  Hilda Lias, M.D.   DATE OF BIRTH:  [birth year]   DATE OF ADMISSION:  05/25/2006  DATE OF DISCHARGE:                              HISTORY & PHYSICAL   Ms. Lounsbury is a lady who is being admitted to the operating room because  of neck pain after she underwent an anterior cervical fusion.  The pain  is mostly localized to the cervical area, going to the shoulder.  Beside  that, she is complaining of back pain with radiation down to the leg.  X-  rays showed that she has false fusion at the level of  5-6 where she  underwent anterior cervical discectomy.  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  Because of the nonunion between 5-6 has been admitted for surgery.   PAST MEDICAL HISTORY:  She had anterior cervical discectomy and fusion  at the level of 5-6; she has a fusion in the lumbar area between L2-3  and L3-4.   FAMILY HISTORY:  Unremarkable.   REVIEW OF SYSTEMS:  Positive for neck pain and lower back pain.   PHYSICAL EXAMINATION:  HEENT:  Normal.  NECK:  She has anterior scar.  She has decreased flexibility of the  cervical spine secondary to pain.  LUNGS:  Clear.  HEART:  Sounds normal.  EXTREMITIES:  Normal pulses.  NEURO:  She has decreased flexibility of the cervical spine.  There is  mild weakness of the biceps.  Lower extremities she has weakness  distally at the level of the dorsiflexion of the feet.  Sensation is  normal.   The x-ray showed that she had a pseudoarthrosis at the level of 5-6.  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   RECOMMENDATIONS:  The patient is being admitted for posterior fusion of  the level 5-6.  She knows about the risks, which are no improvement  whatsoever, need for further surgery, damage to the vertebral artery,  infections, CSF leak, paralysis.           ______________________________  Hilda Lias, M.D.     EB/MEDQ  D:  05/25/2006  T:  05/26/2006  Job:  [id number]

## 2010-11-04 NOTE — Discharge Summary (Signed)
[name] [name], [name] [name]                ACCOUNT NO.:  [id number]   MEDICAL RECORD NO.:  [id number]          PATIENT TYPE:  INP   LOCATION:  3012                         FACILITY:  [facility]   PHYSICIAN:  Hilda Lias, M.D.   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   CLINICAL HISTORY:  Patient was admitted because of leg pain as well as  to both upper extremities.  Patient previously had a fusion at the level  5-6.  Patient __________ case of osteoporosis.  X-rays show false joints  at the other surgery.  Surgery was advised.  Incidentally, she has a  large synovial cyst in the lumbar area.   LABORATORY:  Normal.   COURSE IN THE HOSPITAL:  The patient was taken to surgery and posterior  fusion with lateral mass screws was done and BMP.  Today, she is feeling  is better.  She has __________ of pain and the muscle spasm has  improved.  She is being discharged today to be followed by me in my  office.   CONDITION ON DISCHARGE:  Improved.   MEDICATIONS:  Mepergan, diazepam.   ACTIVITY:  Not to drive, not to do any lifting.   FOLLOWUP:  She is going to be seen by me in four weeks.  Any questions,  she is going to call the office at any time.           ______________________________  Hilda Lias, M.D.     EB/MEDQ  D:  05/28/2006  T:  05/28/2006  Job:  [id number]

## 2010-11-04 NOTE — Consult Note (Signed)
NAME:  [name] [name], [name] [name]                          ACCOUNT NO.:  [id number]   MEDICAL RECORD NO.:  [id number]                   PATIENT TYPE:  REC   LOCATION:  TPC                                  FACILITY:  [facility]   PHYSICIAN:  Zachary George, DO                      DATE OF BIRTH:  02-26-1951   DATE OF CONSULTATION:  09/18/2002  DATE OF DISCHARGE:                                   CONSULTATION   HISTORY OF PRESENT ILLNESS:  The patient returns to clinic today for  reevaluation.  She was last seen on 08/18/2002 at which time she underwent  trigger point injections to the right upper trapezius and levator scapula  muscles which she states did not really help her as much as the previous  trigger point injections into the same muscles.  However, she continues to  complain of significant pain in her right upper back.  She also complains of  increased low back pain radiating to her right lower extremity involving the  lateral thigh and leg.  She states she is unable to get MRIs done secondary  to the hardware that was used for her spinal fusion per Dr. Jeral Fruit.   Her pain today is 8/10 on a subjective scale.  We have discussed  medications.  The patient is reluctant to use opiate-based pain medication  to help her pain.  She has had tapering doses of steroids in the past which  have made her crazy.  She has tried other medications including Celebrex  and Vioxx which did not help.  Bextra gave her minimal improvement, although  there is concern with cost.  Relafen has helped her significantly in the  past as well.  She is currently taking Ultracet just as needed and Pamelor  at bedtime.  She discontinued Mobic as she states it lost its effectiveness.  Her pain is described as throbbing, sharp, burning, and constant.  Nothing  really seems to help at this point.   I reviewed the health and history form and 14-point Review of Systems.  The  patient denies bowel and bladder dysfunction.   PHYSICAL  EXAMINATION:  GENERAL:  Healthy female in no acute distress.  VITAL SIGNS:  Blood pressure 129/65, pulse 80, respirations 20, O2  saturation 99% on room air.  MUSCULOSKELETAL:  There is significant tenderness to palpation in the lumbar  paraspinal muscles bilaterally.  There are trigger points noted in the right  upper trapezius and levator scapulae muscles reproducing patient's upper  extremity radicular symptoms. Spurling maneuver is negative.  NEUROLOGIC:  Intact to motor, sensory, and reflexes in the upper and lower  extremities bilaterally.   IMPRESSION:  1. Myofascial pain syndrome of right upper back with above-noted trigger     points.  2. Chronic low back pain, status post lumbar fusion L2-3 and L3-4 with     persistent lower  extremity radicular symptoms.  3. Degenerative disk disease of the lumbar spine at L4-5.   PLAN:  1. I discussed further treatment options with the patient.  I think we     should give trigger point injections another try to see if we can     decrease her upper back pain.  She has had some improvement with these in     the past, but unfortunately, the last set of injections really did not     help her.  I could not guarantee these will help her this time, but it is     certainly worth a trial, and she wishes to proceed.  2. Will initiate physical therapy for her myofascial upper back pain as well     as chronic low back pain for range of motion, stretching, strengthening,     and myofascial release techniques as well as cervical and scapular     stabilization modalities as needed.  3. Reinitiate Relafen 500 mg 1 p.o. b.i.d., #60 with 1 refill.  4. I instructed her to follow up with Dr. Jeral Fruit who may recommend further     imaging studies.  5. The patient is to return to clinic in one moth for reevaluation.   PROCEDURE:  Trigger point injections to the right upper trapezius and  levator scapulae muscles.   DESCRIPTION OF PROCEDURE:  The procedure was  described to the patient in  detail including the risks benefits, limitations, alternatives, and  potential side effects. Risks include but are not limited to bleeding,  infection, nerve injury, pneumothorax, failure to relieve pain, increased  pain, and allergic reaction to medications.  The patient understands and  wishes to proceed.  Informed consent was obtained.   The skin was prepped in the usual fashion with alcohol swabs.  Each trigger  point was injected with 0.5 ml of 1% Lidocaine pluses 0.5 ml of 0.25%  bupivacaine using a 25-guage 1-1/2 inch needle.  Twitch responses were noted  in each muscle group.  The patient tolerated the procedure well. Discharge  instructions were given.  There were no complications.  The patient was  released in stable condition.   The patient was educated about findings and recommendations and understands.  No barriers to communication.                                               Zachary George, DO    JW/MEDQ  D:  09/18/2002  T:  09/19/2002  Job:  [id number]

## 2010-11-04 NOTE — Op Note (Signed)
NAME:  [name] [name], [name] [name]                          ACCOUNT NO.:  [id number]   MEDICAL RECORD NO.:  [id number]                   PATIENT TYPE:  OUT   LOCATION:  XRAY                                 FACILITY:  [facility]   PHYSICIAN:  Hilda Lias, M.D.                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   PROCEDURE:  Anterior 5-6 diskectomy, decompression of spinal cord,  foraminotomy, removal of 4 to 5 fragments of disk calcified on the right  side, bone graft, allograft, plate, microscope.   SURGEON:  Hilda Lias, M.D.   ASSISTANT:  Hewitt Shorts, M.D.   INDICATIONS FOR PROCEDURE:  The patient was admitted because of neck  and  right upper extremity pain. X-ray showed that she has a herniated disk at  the level of 5-6 to the right. She has some spondylosis mild between 4-5.  The patient was scheduled to have surgery several weeks ago, but she  developed some gastrointestinal problems and the surgery was postponed. Now  she is ready and the risks were explained in the history and physical.   DESCRIPTION OF PROCEDURE:  The patient was taken to the operating room where  the neck  was prepped with Betadine. A transverse incision was made through  the skin  and platysma and was carried down. X-ray showed that indeed we at  the level of the L5-6. The patient has quite a bit of anterior osteophytes  which were removed.   With the microscope we opened the anterior ligament and we entered the disk  space. Total gross diskectomy was done. There was an opening in the  posterior  ligament. An incision was made and there were between 5 to 6  fragments of disk, some of them calcified, going to the right side.  The  nerve was swollen and reddish.   Then decompression of the left side was accomplished. The endplates were  drilled after we decompressed the spinal cord. A 7-mm allograft was accepted  followed by a plate. The lateral cervical spine showed good position of the  graft  on the plate. From then on the area was irrigated. Hemostasis was  achieved with bipolar and the wound was closed with Vicryl and Steri-Strips.                                               Hilda Lias, M.D.    EB/MEDQ  D:  08/04/2003  T:  08/04/2003  Job:  [id number]

## 2010-11-28 ENCOUNTER — Encounter (HOSPITAL_COMMUNITY)
Admission: RE | Admit: 2010-11-28 | Discharge: 2010-11-28 | Disposition: A | Discharge status: Home / Self Care | Payer: Medicare Other | Source: Ambulatory Visit | Attending: Neurosurgery | Admitting: Neurosurgery

## 2010-11-28 LAB — URINALYSIS, ROUTINE W REFLEX MICROSCOPIC
Leukocytes, UA: NEGATIVE
Nitrite: NEGATIVE
Specific Gravity, Urine: 1.009 (ref 1.005–1.030)
Urobilinogen, UA: 0.2 mg/dL (ref 0.0–1.0)
pH: 5.5 (ref 5.0–8.0)

## 2010-11-28 LAB — TYPE AND SCREEN
ABO/RH(D): A NEG
Antibody Screen: NEGATIVE

## 2010-11-28 LAB — URINE MICROSCOPIC-ADD ON

## 2010-11-28 LAB — CBC
HCT: 37.6 % (ref 36.0–46.0)
Hemoglobin: 13.2 g/dL (ref 12.0–15.0)
MCH: 32.6 pg (ref 26.0–34.0)
RBC: 4.05 MIL/uL (ref 3.87–5.11)

## 2010-11-28 LAB — BASIC METABOLIC PANEL
Chloride: 101 mEq/L (ref 96–112)
Glucose, Bld: 91 mg/dL (ref 70–99)
Potassium: 3.5 mEq/L (ref 3.5–5.1)
Sodium: 141 mEq/L (ref 135–145)

## 2010-11-28 LAB — ABO/RH: ABO/RH(D): A NEG

## 2010-11-30 ENCOUNTER — Inpatient Hospital Stay (HOSPITAL_COMMUNITY)
Admission: RE | Admit: 2010-11-30 | Discharge: 2010-12-07 | DRG: 460 | Disposition: A | Discharge status: Home Health | Payer: Medicare Other | Source: Ambulatory Visit | Attending: Neurosurgery | Admitting: Neurosurgery

## 2010-11-30 ENCOUNTER — Inpatient Hospital Stay (HOSPITAL_COMMUNITY): Payer: Medicare Other

## 2010-11-30 DIAGNOSIS — Z981 Arthrodesis status: Secondary | ICD-10-CM

## 2010-11-30 DIAGNOSIS — M810 Age-related osteoporosis without current pathological fracture: Secondary | ICD-10-CM | POA: Diagnosis present

## 2010-11-30 DIAGNOSIS — M1990 Unspecified osteoarthritis, unspecified site: Secondary | ICD-10-CM | POA: Diagnosis present

## 2010-11-30 DIAGNOSIS — I428 Other cardiomyopathies: Secondary | ICD-10-CM | POA: Diagnosis present

## 2010-11-30 DIAGNOSIS — M4310 Spondylolisthesis, site unspecified: Secondary | ICD-10-CM | POA: Diagnosis present

## 2010-11-30 DIAGNOSIS — M5137 Other intervertebral disc degeneration, lumbosacral region: Principal | ICD-10-CM | POA: Diagnosis present

## 2010-11-30 DIAGNOSIS — M51379 Other intervertebral disc degeneration, lumbosacral region without mention of lumbar back pain or lower extremity pain: Principal | ICD-10-CM | POA: Diagnosis present

## 2010-11-30 DIAGNOSIS — I10 Essential (primary) hypertension: Secondary | ICD-10-CM | POA: Diagnosis present

## 2010-11-30 HISTORY — PX: LUMBAR FUSION: SHX111

## 2010-12-05 DIAGNOSIS — I428 Other cardiomyopathies: Secondary | ICD-10-CM

## 2010-12-06 DIAGNOSIS — I428 Other cardiomyopathies: Secondary | ICD-10-CM

## 2010-12-11 ENCOUNTER — Emergency Department (HOSPITAL_COMMUNITY)
Admission: EM | Admit: 2010-12-11 | Discharge: 2010-12-11 | Disposition: A | Discharge status: Home / Self Care | Payer: Medicare Other | Attending: Emergency Medicine | Admitting: Emergency Medicine

## 2010-12-11 DIAGNOSIS — K219 Gastro-esophageal reflux disease without esophagitis: Secondary | ICD-10-CM | POA: Insufficient documentation

## 2010-12-11 DIAGNOSIS — Z9889 Other specified postprocedural states: Secondary | ICD-10-CM | POA: Insufficient documentation

## 2010-12-11 DIAGNOSIS — M545 Low back pain, unspecified: Secondary | ICD-10-CM | POA: Insufficient documentation

## 2010-12-12 NOTE — Discharge Summary (Signed)
  NAMEAREESHA, [name] [name]                ACCOUNT NO.:  [id number]  MEDICAL RECORD NO.:  [id number]  LOCATION:  3015                         FACILITY:  [facility]  PHYSICIAN:  Hilda Lias, M.D.   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  Cardiomyopathy.  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  Cardiomyopathy.  CLINICAL HISTORY:  Ms. Lutzke is a lady who in the past underwent multiple surgery.  Lately she had been complaining of back pain versus both legs. X-ray showed that she had solid fusion in the previous area but at the level of L4-5, she has spondylolisthesis with stenosis.  Surgery was advised.  Laboratory within normal limits.  COURSE IN THE HOSPITAL:  The patient was taken to surgery and L4-5 fusion was done.  Today, she is doing much better.  She is walking.  She had minimal discomfort.  The wound is healing very nicely.  In January 2012, she had some cardiac workup and is found that she had some problem with her heart.  She was seen by the cardiologist in this admission and diagnosis of cardiomyopathy was done.  They are going to continue doing a workup on her here or with the [facility] in [place], [place]. Today, she is ambulating.  She is ready to go home.  CONDITION ON DISCHARGE:  Improved.  MEDICATION:  Dilaudid, Roxicodone and diazepam.  DIET:  Regular activity.  Not to drive for at least 2-3 weeks.  FOLLOWUP:  She is going to be seen by me in 2 weeks.  Also she is going to see her medical doctor in relation to her heart in the next few weeks.          ______________________________ Hilda Lias, M.D.     EB/MEDQ  D:  12/07/2010  T:  12/08/2010  Job:  [id number]  Electronically Signed by Hilda Lias M.D. on 12/12/2010 01:48:04 PM

## 2010-12-12 NOTE — H&P (Signed)
  NAMETEEGHAN, HAMMER                ACCOUNT NO.:  [id number]  MEDICAL RECORD NO.:  [id number]  LOCATION:  3015                         FACILITY:  [facility]  PHYSICIAN:  Hilda Lias, M.D.   DATE OF BIRTH:  [birth year]  DATE OF ADMISSION:  11/30/2010 DATE OF DISCHARGE:                             HISTORY & PHYSICAL   Ms. Yaun is a lady who is being admitted because of back pain, worsened to both legs, which is getting worse up to the point that she has fallen in several occasions at home.  This lady in the past has had decompressive laminectomy and fusion.  She also has a fracture of the lumbar area with vertebroplasty.  This endorsing the facing point now and she is getting worse.  We did a lumbar myelogram several weeks ago because of the findings she is being admitted for surgery.  PAST MEDICAL HISTORY:  She has a lumbar surgery in 2011, repair of tendon in 2010, anterior cervical diskectomy, hysterectomy, cholecystectomy. SOCIAL HISTORY:  The patient do not smoke, do not drink.  FAMILY HISTORY:  Unremarkable.  REVIEW OF SYSTEMS:  Positive for history of osteoporosis, bronchitis.  ALLERGIES:  She is allergic to ASPIRIN, LATEX, OXYCODONE, MORPHINE, HYDROCODONE, CODEINE.  PHYSICAL EXAMINATION:  HEAD, EARS, NOSE, AND THROAT:  Normal. NECK:  Normal. CARDIOVASCULAR:  Normal. LUNGS:  There is some bilateral rhonchi. ABDOMEN:  Normal. EXTREMITIES:  Normal pulse. NEURO:  Mental status normal.  Cranial nerves normal.  Strength 5/5 with some weakness and dorsiflexion of both legs.  The straight leg raising is positive of 60 degrees.  She has a decreased flexion of the lumbar spine.  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  RECOMMENDATIONS:  The patient is being admitted for surgery.  The procedure will be  decompression and fusion at the level of L4-5 with pedicle screws and possible arthrodesis.  She knows the risk of the surgery including possibility of CSF leak, infection, no fusion whatsoever because of history of osteoporosis worsen the pain.          ______________________________ Hilda Lias, M.D.     EB/MEDQ  D:  11/30/2010  T:  12/01/2010  Job:  [id number]  Electronically Signed by Hilda Lias M.D. on 12/12/2010 01:47:57 PM

## 2010-12-12 NOTE — Op Note (Signed)
NAMECYBILL, URIEGAS                ACCOUNT NO.:  [id number]  MEDICAL RECORD NO.:  [id number]  LOCATION:  3015                         FACILITY:  [facility]  PHYSICIAN:  Hilda Lias, M.D.   DATE OF BIRTH:  09/22/1950  DATE OF PROCEDURE:  11/30/2010 DATE OF DISCHARGE:  11/28/2010                              OPERATIVE REPORT   PREOPERATIVE DIAGNOSIS:  L4-5 stenosis with spondylolisthesis, status post fusion L2-L3, L3-L4, L1 kyphoplasty.  POSTOPERATIVE DIAGNOSIS:  L4-5 stenosis with spondylolisthesis, status post fusion L2-L3, L3-L4, L1 kyphoplasty.  PROCEDURE:  Bilateral L4-5 discectomy and facetectomy, interbody fusion with cages, 12 x 22 with Osteocel, pedicle screws L4-L5, posterolateral arthrodesis with Osteocel.  Cell Saver, C-arm.  SURGEON:  Hilda Lias, MD  ASSISTANT:  Hewitt Shorts, M.D.  CLINICAL HISTORY:  Ms. Gilberti is a lady who had multiple problems with lumbar spine.  She has fusion before at the level 2-3, 3-4.  She has a fracture of L1, which required kyphoplasty.  She has surgery several months ago for back pain.  Nevertheless, she continued to get worse. The surgery was associated CSF leak.  Now she came telling me that she is falling down, that she had a lot of pain down bilaterally to both legs, right worse that the left one.  She had conservative treatment. She has epidural injection and she is noted better.  X-rays showed stenosis with spondylolisthesis at L4-5.  Surgery was advised and the risks were fully explained to her including the possibility CSF leak, infection, and no improvement whatsoever.  PROCEDURE:  The patient was taken to the OR, and after intubation she was positioned in prone manner.  The back was cleaned with DuraPrep and midline incision following the previous one was made.  Dissection was carried out.  The patient had quite a bit of scar tissue, and we able to identify the hardware.  L3 was in placed and it was solid.  We  drilled the pedicle screws of L4.  We start dissecting laterally until we were able to feel the facet of L4.  Dissection was carried and the facet was removed.  Then we have to do lysis of adhesions to be able to define the thecal sac as well as the L4 and L5 nerve root.  Incision of the disk space was done and we have to do more facetectomy __________ to be able to get into the disk space and allow the cages to fit inside.  Total diskectomy with removal of the end plate was accomplished.  Then two cages with 12 x 22 with Osteocel were selected, pressed on the left side and then on the right side.  We tried to hook a special plate at the previous fusion but we were unable to do that.  Because of that we had to cut the rob between __________ and removed the L4 screw bilaterally. Once it was done, we brought __________ placed.  Then using the C-arm, first we made AP view and lateral view.  We probed the pedicle of L5 bilaterally and we introduced 2 screws of 5.5 x 50.  AP and lateral showed good position.  We investigated the area, and there was  plenty of space for the thecal sac at the L4-L5 level  and there was no perforation of the neural pedicle.  __________ laterally for arthrodesis at the level of __________.  From then on, the area was irrigated. Valsalva maneuver was negative.  __________ we kept a rod in place using four caps.  Then the area was irrigated and the wound was closed with Vicryl and staples.          ______________________________ Hilda Lias, M.D.     EB/MEDQ  D:  11/30/2010  T:  12/01/2010  Job:  [id number]  Electronically Signed by Hilda Lias M.D. on 12/12/2010 01:48:01 PM

## 2010-12-12 NOTE — H&P (Signed)
  NAMEBESSE, MIRON                ACCOUNT NO.:  [id number]  MEDICAL RECORD NO.:  [id number]  LOCATION:  3015                         FACILITY:  [facility]  PHYSICIAN:  Hilda Lias, M.D.   DATE OF BIRTH:  [birth year]  DATE OF ADMISSION:  11/30/2010 DATE OF DISCHARGE:                             HISTORY & PHYSICAL   NO DICTATION FOR THIS JOB.          ______________________________ Hilda Lias, M.D.     EB/MEDQ  D:  11/30/2010  T:  12/01/2010  Job:  [id number]  Electronically Signed by Hilda Lias M.D. on 12/12/2010 01:47:58 PM

## 2010-12-13 ENCOUNTER — Encounter: Payer: Self-pay | Admitting: *Deleted

## 2010-12-23 ENCOUNTER — Ambulatory Visit (INDEPENDENT_AMBULATORY_CARE_PROVIDER_SITE_OTHER): Payer: Medicare Other | Admitting: Cardiovascular Disease

## 2010-12-23 ENCOUNTER — Encounter: Payer: Self-pay | Admitting: Cardiovascular Disease

## 2010-12-23 DIAGNOSIS — R0600 Dyspnea, unspecified: Secondary | ICD-10-CM | POA: Insufficient documentation

## 2010-12-23 DIAGNOSIS — I428 Other cardiomyopathies: Secondary | ICD-10-CM

## 2010-12-23 DIAGNOSIS — R0609 Other forms of dyspnea: Secondary | ICD-10-CM

## 2010-12-23 DIAGNOSIS — I43 Cardiomyopathy in diseases classified elsewhere: Secondary | ICD-10-CM | POA: Insufficient documentation

## 2010-12-23 DIAGNOSIS — I10 Essential (primary) hypertension: Secondary | ICD-10-CM | POA: Insufficient documentation

## 2010-12-23 NOTE — Assessment & Plan Note (Signed)
Her blood pressure is well controlled. Continue current treatment.

## 2010-12-23 NOTE — Assessment & Plan Note (Signed)
The patient has progressive exertional dyspnea over the last 6 months. Currently happening with minimal activities. This is possibly due to overall physical deconditioning as she has been sick frequently with multiple hospitalizations and surgeries. Nonetheless, I think this deserves further evaluation to make sure there is no underlying ischemic heart disease. Thus, I recommend further evaluation with a pharmacologic nuclear stress test. The patient is not able to exercise on a treadmill due to her back problems. She still having a hard time lying flat. She is going to have a followup appointment and will likely have the brace removed in the near future. She will call us at this time to schedule the stress test. I don't see any urgency in this.

## 2010-12-23 NOTE — Progress Notes (Signed)
HPI  This is a 60 year old female who is here today for followup visit after recent hospitalization. The patient has extensive neck problems with multiple surgeries with complications. She had multiple surgeries late last year. In January of 2012, she was admitted to [facility] do to pyelonephritis and was a acutely ill at that time. An echocardiogram was performed and showed an ejection fraction of 25%. This was not investigated at that time. The patient was treated for the infection and was discharged home. She presented in June for an elective back surgery. And her preoperative records, it was noted that she had an echocardiogram with reduced ejection fraction. Nonetheless, the surgery was completed without complications. The patient was seen by our cardiology consult team. Dr. Tedra Sen[place] so her. Repeat echocardiogram was performed which showed an ejection fraction of 60% without significant wall motion abnormalities. The patient is not aware of any previous history of myocardial infarction or congestive heart failure. She has no previous history of stroke. She denies any chest pain, dizziness, palpitations or syncope. She has noticed progressive exertional dyspnea which has progressed to the point of being with minimal activities. She never had a stress test in the past. She still recovering from her most recent back surgery and is wearing a brace.  Allergies  Allergen Reactions  . Latex Itching  . Morphine And Related Hives and Itching  . Aspirin   . Codeine   . Darvocet (Propoxyphene N-Acetaminophen)   . Percocet (Oxycodone-Acetaminophen)   . Vicodin (Hydrocodone-Acetaminophen)      Current Outpatient Prescriptions on File Prior to Visit  Medication Sig Dispense Refill  . aspirin 81 MG tablet Take 81 mg by mouth daily.        . bisoprolol-hydrochlorothiazide (ZIAC) 5-6.25 MG per tablet Take 1 tablet by mouth daily.        . diazepam (VALIUM) 10 MG tablet Take 10 mg by mouth every 6 (six)  hours as needed.        Marland Kitchen estrogens, conjugated, (PREMARIN) 1.25 MG tablet Take 1.25 mg by mouth daily.        . vitamin B-12 (CYANOCOBALAMIN) 1000 MCG tablet Take 1,000 mcg by mouth daily.        Marland Kitchen albuterol (PROVENTIL HFA;VENTOLIN HFA) 108 (90 BASE) MCG/ACT inhaler Inhale 2 puffs into the lungs every 4 (four) hours as needed.        Marland Kitchen DISCONTD: docusate sodium (COLACE) 100 MG capsule Take 100 mg by mouth 2 (two) times daily.        Marland Kitchen DISCONTD: enoxaparin (LOVENOX) 40 MG/0.4ML SOLN Inject into the skin.        Marland Kitchen DISCONTD: gabapentin (NEURONTIN) 300 MG capsule Take 300 mg by mouth 3 (three) times daily.        Marland Kitchen DISCONTD: HYDROmorphone (DILAUDID) 2 MG tablet Take 2 mg by mouth every 4 (four) hours as needed.        Marland Kitchen DISCONTD: oxycodone (ROXICODONE) 30 MG immediate release tablet Take 30 mg by mouth every 4 (four) hours as needed.           Past Medical History  Diagnosis Date  . Osteoporosis   . Varicosities     venous  . Back pain   . Cardiomyopathy 06/2010    EF was 25% during acute illness. Repeat echo in 11/2010 showed normal EF.   Marland Kitchen Hypertension      Past Surgical History  Procedure Date  . Lumbar disc surgery 2011  . Tendon repair 2010  .  Anterior cervical diskectomy   . Abdominal hysterectomy   . Cholecystectomy   . Hand surgery      History reviewed. No pertinent family history.   History   Social History  . Marital Status: Legally Separated    Spouse Name: N/A    Number of Children: N/A  . Years of Education: N/A   Occupational History  . Not on file.   Social History Main Topics  . Smoking status: Never Smoker   . Smokeless tobacco: Not on file  . Alcohol Use: No  . Drug Use: Not on file  . Sexually Active: Not on file   Other Topics Concern  . Not on file   Social History Narrative   Has one childDisabled       PHYSICAL EXAM   BP 136/85  Pulse 73  Ht 5\' 5"  (1.651 m)  Wt 162 lb 1.9 oz (73.537 kg)  BMI 26.98 kg/m2  Constitutional:  She is oriented to person, place, and time. She appears well-developed and well-nourished. No distress.  HENT: No nasal discharge.  Head: Normocephalic and atraumatic.  Eyes: Pupils are equal, round, and reactive to light. Right eye exhibits no discharge. Left eye exhibits no discharge.  Neck: Normal range of motion. Neck supple. No JVD present. No thyromegaly present.  Cardiovascular: Normal rate, regular rhythm, normal heart sounds and intact distal pulses. Exam reveals no gallop and no friction rub.  No murmur heard.  Pulmonary/Chest: Effort normal and breath sounds normal. No stridor. No respiratory distress. She has no wheezes. She has no rales. She exhibits no tenderness.  Abdominal: Soft. Bowel sounds are normal. She exhibits no distension. There is no tenderness. There is no rebound and no guarding.  Musculoskeletal: Normal range of motion. She exhibits no edema and no tenderness.  Neurological: She is alert and oriented to person, place, and time. Coordination normal.  Skin: Skin is warm and dry. No rash noted. She is not diaphoretic. No erythema. No pallor.  Psychiatric: She has a normal mood and affect. Her behavior is normal. Judgment and thought content normal.      ASSESSMENT AND PLAN

## 2010-12-23 NOTE — Assessment & Plan Note (Signed)
The patient had a reduced ejection fraction in her echocardiogram in January of this year during hospitalization for pyelonephritis. She was quiet sick at that time. Subsequent echocardiogram in June of this year showed normal LV systolic function. 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 I recommend continuing treatment of her hypertension.

## 2011-01-05 NOTE — Consult Note (Signed)
[name], [name]                ACCOUNT NO.:  [id number]  MEDICAL RECORD NO.:  [id number]  LOCATION:  3015                         FACILITY:  [facility]  PHYSICIAN:  Arturo Morton. Riley Kill, MD, FACCDATE OF BIRTH:  [birth year]  DATE OF CONSULTATION:  12/05/2010 DATE OF DISCHARGE:                                CONSULTATION   PRIMARY CARDIOLOGIST:  New to [facility], they will follow up in [place].  PRIMARY CARE PHYSICIAN:  Lia Hopping, MD  REQUESTING PHYSICIAN:  Hilda Lias, MD  PATIENT PROFILE:  This is a 60 year old female with history of multiple lumbar back surgeries status post surgery on November 30, 2010 who on preoperative evaluation was noted to have a cardiomyopathy by echocardiogram performed in January 2012 and we have been asked to evaluate.  PROBLEMS: 1. Cardiomyopathy.     a.     2-D echocardiogram performed on July 11, 2010 revealing      an EF of 20-25% with global hypokinesis and multiple areas of      dyskinesis. 2. Hypertension. 3. Chronic back pain.     a.     Status post multiple lumbar surgeries.  Last was performed      on November 30, 2010. 4. History of cervical disk surgery in February 2005 and December     2007. 5. History of carpal tunnel syndrome status post release with hand     surgery in January 2010 and September 2010. 6. History of pyelonephritis in January 2012. 7. History of mitral valve prolapse as a child. 8. History of syncope as a child, the last was probably about age 49. 79. Osteoarthritis. 10.Osteoporosis. 11.Status post hysterectomy. 12.Status post cholecystectomy.  ALLERGIES: 1. ASPIRIN. 2. LATEX. 3. PERCOCET. 4. PROPOXYPHENE. 5. MORPHINE. 6. VICODIN. 7. CODEINE.  HISTORY OF PRESENT ILLNESS:  This is a 60 year old female with history of multiple back surgeries.  Following surgery with prolonged hospital course in late 2011, the patient continued to feel poorly at home and was admitted to [facility] in [place] in  January 2012.  She was hospitalized for 5 days.  During that admission, she was diagnosed with pyelonephritis.  A sepsis workup appears to have shown normal CT of the pelvis, normal head CT, normal chest CT, normal blood, and urine cultures.  An echocardiogram was performed during that admission and the report shows an EF of 20-25% with global hypokinesis and multiple areas of dyskinesis.  The patient also has mild mitral vegetation, tricuspid regurgitation, and pulmonic regurgitation.  The patient was discharged home on July 13, 2010 and Cardiology was not consulted.  Further, the patient had no awareness of reduced LV function.  Discharge diagnosis from July 13, 2010 from [facility] did not report any abnormality on 2-D echo.  The patient presented to [facility] on November 30, 2010 for an elective lumbar surgery with Dr. Jeral Fruit.  It was noted preoperatively by Anesthesia that the patient's EF was 20-25% by report in January.  She denied any symptoms of heart failure or angina and surgery proceeded uneventfully.  Postoperatively, the patient has been doing well and recovering well and has plans for discharge within the next 48 hours. We have been  asked to evaluate secondary to this finding of cardiomyopathy.  The patient does report dyspnea on exertion with moderate activity and at one point has had lower extremity edema which has since resolved.  She denies PND, orthopnea, any recent dizziness, syncope, or early satiety.  CURRENT MEDICATIONS: 1. Aspirin 81 mg daily. 2. Ziac 5/6.25 mg daily. 3. Premarin 1.25 mg daily. 4. B12 1000 mcg daily. 5. Colace 100 mg b.i.d. 6. Enoxaparin 40 mg daily. 7. Neurontin 300 mg t.i.d. 8. Albuterol 2 puffs inhaled q.4 h. p.r.n.  FAMILY HISTORY:  Mother is alive at age 50 with a history hypertension, CVA, and valvular heart disease requiring valve repair.  Father died of liver cancer at 38.  She has two brothers and a sister, all are  alive and well.  SOCIAL HISTORY:  The patient lives in [place], [place] with her husband.  She denies tobacco, alcohol, or drug use and not routinely exercising.  REVIEW OF SYSTEMS:  Positive for history of syncope in her teens.  She has chronic back pain.  She does experience dyspnea on exertion with moderate level of activity and at one point had lower extremity edema but this has since resolved.  She is a full code.  Otherwise all systems reviewed are negative.  PHYSICAL EXAMINATION:  VITAL SIGNS:  Temperature 97.8, heart rate 81, respirations 18, blood pressure 92/55, and pulse ox 98% on room air. GENERAL:  Pleasant white female in no acute distress, awake, alert, and oriented x3.  She has normal affect. HEENT:  Normal. NEURO:  Grossly intact and nonfocal. SKIN:  Warm, dry without lesions or masses. NECK:  Supple without bruits or JVD. LUNGS:  Respirations are regular and unlabored.  Clear to auscultation. CARDIAC:  Regular S1-S2.  No S3, S4, or murmurs. ABDOMEN:  Round, soft, nontender, and nondistended.  Bowel sounds present x4. EXTREMITIES:  Warm, dry, and pink without clubbing, cyanosis, or edema. Dorsalis pedis and posterior tibial pulses are 2+ and equal bilaterally.  IMAGING:  EKG from July 08, 2010 shows sinus rhythm, rate 83, and no acute ST-T changes.  LABORATORY DATA:  From November 28, 2010 show hemoglobin 13.2, hematocrit 37.6, WBC 8.1, and platelets 208.  Sodium 141, potassium 3.5, chloride 101, CO2 34, BUN 7, creatinine less than 0.47, and glucose 91.  ASSESSMENT AND PLAN: 1. Cardiomyopathy.  The patient had echocardiogram in January 2012     showing reduced ejection fraction and multiple wall motion     abnormalities.  The patient has been unaware of this finding.  She     is euvolemic on exam but does report some dyspnea on exertion with     moderate activity as well as intermittent lower extremity edema.     She denies chest pain, syncope, or other  heart failure symptoms.     She has undergone lumbar surgery and has tolerated this well.  We     will go ahead and check an ECG today as well as a 2-D     echocardiogram prior to discharge and arrange for a followup in our     [place] office.  The patient will require an ischemic evaluation.     Continue beta-blocker.  Ideally, we would place her on an ACE     inhibitor and ARB, however, her blood pressure is running in the     90s thus not allowing this. 2. Status post lumbar surgery per primary team.     Nicolasa Ducking, ANP   ______________________________ Arturo Morton.  Riley Kill, MD, [facility]    CB/MEDQ  D:  12/05/2010  T:  12/06/2010  Job:  [id number]  Electronically Signed by Nicolasa Ducking ANP on 12/09/2010 02:34:04 PM Electronically Signed by Shawnie Pons MD [facility] on 01/05/2011 07:22:49 AM

## 2011-02-26 ENCOUNTER — Emergency Department (HOSPITAL_COMMUNITY): Payer: Medicare Other

## 2011-02-26 ENCOUNTER — Emergency Department (HOSPITAL_COMMUNITY)
Admission: EM | Admit: 2011-02-26 | Discharge: 2011-02-26 | Disposition: A | Discharge status: Home / Self Care | Payer: Medicare Other | Attending: Emergency Medicine | Admitting: Emergency Medicine

## 2011-02-26 DIAGNOSIS — M545 Low back pain, unspecified: Secondary | ICD-10-CM | POA: Insufficient documentation

## 2011-02-26 DIAGNOSIS — S20229A Contusion of unspecified back wall of thorax, initial encounter: Secondary | ICD-10-CM | POA: Insufficient documentation

## 2011-02-26 DIAGNOSIS — K219 Gastro-esophageal reflux disease without esophagitis: Secondary | ICD-10-CM | POA: Insufficient documentation

## 2011-02-26 DIAGNOSIS — M542 Cervicalgia: Secondary | ICD-10-CM | POA: Insufficient documentation

## 2011-02-26 DIAGNOSIS — G8929 Other chronic pain: Secondary | ICD-10-CM | POA: Insufficient documentation

## 2011-02-26 DIAGNOSIS — W010XXA Fall on same level from slipping, tripping and stumbling without subsequent striking against object, initial encounter: Secondary | ICD-10-CM | POA: Insufficient documentation

## 2011-02-26 DIAGNOSIS — I10 Essential (primary) hypertension: Secondary | ICD-10-CM | POA: Insufficient documentation

## 2011-03-28 LAB — CBC
Hemoglobin: 13.4
RBC: 4.25
WBC: 7.2

## 2011-03-28 LAB — BASIC METABOLIC PANEL
CO2: 27
Calcium: 9.2
Chloride: 104
Creatinine, Ser: 0.58
GFR calc Af Amer: >60
Sodium: 139

## 2011-04-03 LAB — CBC
HCT: 41.4
MCHC: 33.5
MCV: 93.2
RBC: 4.44
WBC: 10.7 — ABNORMAL HIGH

## 2011-04-03 LAB — COMPREHENSIVE METABOLIC PANEL
BUN: 11
CO2: 27
Calcium: 9.4
Chloride: 103
Creatinine, Ser: 0.77
GFR calc non Af Amer: >60
Glucose, Bld: 86
Total Bilirubin: 0.7

## 2011-04-03 LAB — DIFFERENTIAL
Basophils Absolute: 0.1
Eosinophils Relative: 1
Lymphocytes Relative: 29
Neutro Abs: 6.8
Neutrophils Relative %: 63

## 2011-04-18 ENCOUNTER — Telehealth: Payer: Self-pay | Admitting: *Deleted

## 2011-04-18 NOTE — Telephone Encounter (Signed)
Thanks

## 2011-05-09 ENCOUNTER — Other Ambulatory Visit: Payer: Self-pay | Admitting: Orthopedic Surgery

## 2011-05-10 ENCOUNTER — Encounter: Payer: Self-pay | Admitting: Orthopedic Surgery

## 2011-05-10 ENCOUNTER — Other Ambulatory Visit: Payer: Self-pay | Admitting: Orthopedic Surgery

## 2011-05-10 DIAGNOSIS — R51 Headache: Secondary | ICD-10-CM | POA: Insufficient documentation

## 2011-05-10 NOTE — H&P (Signed)
[name] [name] is an 60 y.o. female.   Chief Complaint:    Right knee pain since august HPI: 60 yo White female presents with hx of a fall in august with continued Right knee pain.  She was treated with an immobilizer initially and it was ineffective.  Meds didn't help.  An MRI was done in oct showing a tear of the post horn of the medial meniscus and a loose body in the suprapatellar pouch.  Because of the pain and loose body she is presenting for a knee arthroscopy  Past Medical History  Diagnosis Date  . Osteoporosis   . Varicosities     venous  . Back pain   . Cardiomyopathy 06/2010    EF was 25% during acute illness. Repeat echo in 11/2010 showed normal EF.   . Hypertension   . Headache     Past Surgical History  Procedure Date  . Lumbar disc surgery 2011  . Tendon repair 2010  . Anterior cervical diskectomy   . Abdominal hysterectomy   . Cholecystectomy   . Hand surgery   . Knee surgery     History reviewed. No pertinent family history. Social History:  reports that she has never smoked. She does not have any smokeless tobacco history on file. She reports that she does not drink alcohol. Her drug history not on file.  Allergies:  Allergies  Allergen Reactions  . Latex Itching  . Morphine And Related Hives and Itching  . Aspirin   . Codeine   . Darvocet (Propoxyphene N-Acetaminophen)   . Percocet (Oxycodone-Acetaminophen)   . Vicodin (Hydrocodone-Acetaminophen)     Medications Prior to Admission  Medication Sig Dispense Refill  . albuterol (PROVENTIL HFA;VENTOLIN HFA) 108 (90 BASE) MCG/ACT inhaler Inhale 2 puffs into the lungs every 4 (four) hours as needed.        . ALPRAZolam (XANAX) 0.5 MG tablet Take 0.5 mg by mouth at bedtime as needed.        . aspirin 81 MG tablet Take 81 mg by mouth daily.        . bisoprolol-hydrochlorothiazide (ZIAC) 5-6.25 MG per tablet Take 1 tablet by mouth daily.        . Calcium Carbonate-Vit D-Min 1200-1000 MG-UNIT CHEW Chew by  mouth 2 (two) times daily.        . cyclobenzaprine (FLEXERIL) 10 MG tablet Take 10 mg by mouth. Takes as needed.       . diazepam (VALIUM) 10 MG tablet Take 10 mg by mouth every 6 (six) hours as needed.        . esomeprazole (NEXIUM) 40 MG capsule Take 40 mg by mouth daily.        . estrogens, conjugated, (PREMARIN) 1.25 MG tablet Take 1.25 mg by mouth daily.        . HYDROmorphone (DILAUDID) 4 MG tablet Take 4 mg by mouth every 4 (four) hours.        . ibandronate (BONIVA) 150 MG tablet Take 150 mg by mouth every 30 (thirty) days. Take in the morning with a full glass of water, on an empty stomach, and do not take anything else by mouth or lie down for the next 30 min.       . oxyCODONE (OXYCONTIN) 15 MG TB12 Take 15 mg by mouth every 4 (four) hours.        . vitamin B-12 (CYANOCOBALAMIN) 1000 MCG tablet Take 1,000 mcg by mouth daily.           No current facility-administered medications on file as of 05/10/2011.    No results found for this or any previous visit (from the past 48 hour(s)). No results found.  Review of Systems  Constitutional: Positive for weight loss.  Eyes: Negative.   Respiratory: Negative.   Cardiovascular: Negative.   Gastrointestinal: Negative.   Genitourinary: Negative.   Musculoskeletal: Positive for joint pain.  Skin: Negative.   Neurological: Positive for headaches.  Endo/Heme/Allergies: Negative.   Psychiatric/Behavioral: Negative.     There were no vitals taken for this visit. Physical Exam  Constitutional: She is oriented to person, place, and time. She appears well-developed and well-nourished.  HENT:  Head: Normocephalic and atraumatic.  Eyes: Pupils are equal, round, and reactive to light.  Neck: Normal range of motion.  Cardiovascular: Normal rate.   Respiratory: Effort normal.  GI: Soft.  Musculoskeletal:       Right shoulder: Normal.       Left shoulder: Normal.       Right elbow: Normal.      Left elbow: Normal.       Right wrist:  Normal.       Left wrist: Normal.       Right hip: Normal.       Left hip: Normal.       Right knee: tenderness found. Medial joint line tenderness noted.       Left knee: Normal.       Right ankle: Normal.       Left ankle: Normal.       Legs: Neurological: She is alert and oriented to person, place, and time.  Skin: Skin is warm and dry.  Psychiatric: She has a normal mood and affect. Her behavior is normal.     Assessment/Plan Torn medial meniscus Right knee with loose body HTN Hx of Cardiomyopathy  Admit for an outpatient Right knee arthroscopy with removal of loose body.  Nassim Cosma 05/10/2011, 8:52 AM    

## 2011-05-15 ENCOUNTER — Encounter (HOSPITAL_BASED_OUTPATIENT_CLINIC_OR_DEPARTMENT_OTHER): Payer: Self-pay | Admitting: *Deleted

## 2011-05-15 NOTE — Progress Notes (Signed)
NPO AFTER MN. NEEDS CBC W/ DIFF, BMET.  CURRENT EKG IN EPIC. WILL TAKE NEXIUM, PREMARIN AND EITHER VALIUM OR FLEXERIL AM OF SURG. W/ SIP OF SURG.

## 2011-05-17 ENCOUNTER — Encounter (HOSPITAL_BASED_OUTPATIENT_CLINIC_OR_DEPARTMENT_OTHER)
Admission: RE | Disposition: A | Discharge status: Home / Self Care | Payer: Self-pay | Source: Ambulatory Visit | Attending: Orthopedic Surgery

## 2011-05-17 ENCOUNTER — Encounter (HOSPITAL_BASED_OUTPATIENT_CLINIC_OR_DEPARTMENT_OTHER): Payer: Self-pay | Admitting: *Deleted

## 2011-05-17 ENCOUNTER — Ambulatory Visit (HOSPITAL_BASED_OUTPATIENT_CLINIC_OR_DEPARTMENT_OTHER): Payer: Medicare Other | Admitting: Anesthesiology

## 2011-05-17 ENCOUNTER — Encounter (HOSPITAL_BASED_OUTPATIENT_CLINIC_OR_DEPARTMENT_OTHER): Payer: Self-pay | Admitting: Anesthesiology

## 2011-05-17 ENCOUNTER — Ambulatory Visit (HOSPITAL_BASED_OUTPATIENT_CLINIC_OR_DEPARTMENT_OTHER)
Admission: RE | Admit: 2011-05-17 | Discharge: 2011-05-17 | Disposition: A | Discharge status: Home / Self Care | Payer: Medicare Other | Source: Ambulatory Visit | Attending: Orthopedic Surgery | Admitting: Orthopedic Surgery

## 2011-05-17 DIAGNOSIS — Z79899 Other long term (current) drug therapy: Secondary | ICD-10-CM | POA: Insufficient documentation

## 2011-05-17 DIAGNOSIS — I10 Essential (primary) hypertension: Secondary | ICD-10-CM | POA: Insufficient documentation

## 2011-05-17 DIAGNOSIS — IMO0002 Reserved for concepts with insufficient information to code with codable children: Secondary | ICD-10-CM | POA: Insufficient documentation

## 2011-05-17 DIAGNOSIS — M2340 Loose body in knee, unspecified knee: Secondary | ICD-10-CM | POA: Insufficient documentation

## 2011-05-17 DIAGNOSIS — M810 Age-related osteoporosis without current pathological fracture: Secondary | ICD-10-CM | POA: Insufficient documentation

## 2011-05-17 DIAGNOSIS — X58XXXA Exposure to other specified factors, initial encounter: Secondary | ICD-10-CM | POA: Insufficient documentation

## 2011-05-17 HISTORY — DX: Encounter for other specified aftercare: Z51.89

## 2011-05-17 HISTORY — DX: Other chronic pain: G89.29

## 2011-05-17 HISTORY — DX: Other symptoms and signs involving the musculoskeletal system: R29.898

## 2011-05-17 HISTORY — DX: Cardiac murmur, unspecified: R01.1

## 2011-05-17 HISTORY — DX: Meningitis, unspecified: G03.9

## 2011-05-17 HISTORY — DX: Nausea with vomiting, unspecified: R11.2

## 2011-05-17 HISTORY — DX: Gastro-esophageal reflux disease without esophagitis: K21.9

## 2011-05-17 HISTORY — DX: Dorsalgia, unspecified: M54.9

## 2011-05-17 HISTORY — PX: KNEE ARTHROSCOPY: SHX127

## 2011-05-17 HISTORY — DX: Reserved for inherently not codable concepts without codable children: IMO0001

## 2011-05-17 HISTORY — DX: Personal history of other diseases of the respiratory system: Z87.09

## 2011-05-17 HISTORY — DX: Other specified postprocedural states: Z98.890

## 2011-05-17 LAB — BASIC METABOLIC PANEL
GFR calc Af Amer: >90 mL/min (ref >90)
GFR calc non Af Amer: >90 mL/min (ref >90)
Glucose, Bld: 99 mg/dL (ref 70–99)
Potassium: 3.7 mEq/L (ref 3.5–5.1)
Sodium: 138 mEq/L (ref 135–145)

## 2011-05-17 LAB — DIFFERENTIAL
Lymphocytes Relative: 51 % — ABNORMAL HIGH (ref 12–46)
Lymphs Abs: 3 10*3/uL (ref 0.7–4.0)
Monocytes Relative: 8 % (ref 3–12)
Neutro Abs: 2.3 10*3/uL (ref 1.7–7.7)
Neutrophils Relative %: 39 % — ABNORMAL LOW (ref 43–77)

## 2011-05-17 LAB — CBC
Hemoglobin: 11.7 g/dL — ABNORMAL LOW (ref 12.0–15.0)
RBC: 3.75 MIL/uL — ABNORMAL LOW (ref 3.87–5.11)
WBC: 5.8 10*3/uL (ref 4.0–10.5)

## 2011-05-17 SURGERY — ARTHROSCOPY, KNEE
Anesthesia: Monitor Anesthesia Care | Site: Knee | Laterality: Right | Wound class: Clean

## 2011-05-17 MED ORDER — SODIUM CHLORIDE 0.9 % IR SOLN
Status: DC | PRN
  Administered 2011-05-17: 10:00:00

## 2011-05-17 MED ORDER — CHLORHEXIDINE GLUCONATE 4 % EX LIQD: 60.0000 mL | Freq: Once | CUTANEOUS | Status: DC

## 2011-05-17 MED ORDER — LIDOCAINE HCL (CARDIAC) 20 MG/ML IV SOLN
INTRAVENOUS | Status: DC | PRN
  Administered 2011-05-17: 50 mg via INTRAVENOUS

## 2011-05-17 MED ORDER — FENTANYL CITRATE 0.05 MG/ML IJ SOLN
100.0000 ug | Freq: Once | INTRAMUSCULAR | Status: AC
  Administered 2011-05-17: 100 ug via INTRAVENOUS

## 2011-05-17 MED ORDER — BUPIVACAINE-EPINEPHRINE 0.25% -1:200000 IJ SOLN
INTRAMUSCULAR | Status: DC | PRN
  Administered 2011-05-17: 20 mL

## 2011-05-17 MED ORDER — LACTATED RINGERS IV SOLN
INTRAVENOUS | Status: DC
  Administered 2011-05-17 (×2): via INTRAVENOUS

## 2011-05-17 MED ORDER — CEFAZOLIN SODIUM 1-5 GM-% IV SOLN
1.0000 g | INTRAVENOUS | Status: AC
  Administered 2011-05-17: 1 g via INTRAVENOUS

## 2011-05-17 MED ORDER — MIDAZOLAM HCL 2 MG/2ML IJ SOLN
2.0000 mg | Freq: Once | INTRAMUSCULAR | Status: AC
  Administered 2011-05-17: 2 mg via INTRAVENOUS

## 2011-05-17 MED ORDER — METOCLOPRAMIDE HCL 5 MG/ML IJ SOLN: 10.0000 mg | Freq: Once | INTRAMUSCULAR | Status: DC | PRN

## 2011-05-17 MED ORDER — DEXAMETHASONE SODIUM PHOSPHATE 4 MG/ML IJ SOLN
INTRAMUSCULAR | Status: DC | PRN
  Administered 2011-05-17: 8 mg via INTRAVENOUS

## 2011-05-17 MED ORDER — MIDAZOLAM HCL 5 MG/5ML IJ SOLN
INTRAMUSCULAR | Status: DC | PRN
  Administered 2011-05-17 (×2): 1 mg via INTRAVENOUS

## 2011-05-17 MED ORDER — SODIUM CHLORIDE 0.9 % IV SOLN: INTRAVENOUS | Status: DC

## 2011-05-17 MED ORDER — MEPERIDINE HCL 50 MG PO TABS
50.0000 mg | ORAL_TABLET | Freq: Once | ORAL | Status: AC
  Administered 2011-05-17: 50 mg via ORAL

## 2011-05-17 MED ORDER — HYDROMORPHONE HCL PF 1 MG/ML IJ SOLN: 0.2500 mg | INTRAMUSCULAR | Status: DC | PRN

## 2011-05-17 MED ORDER — FENTANYL CITRATE 0.05 MG/ML IJ SOLN
INTRAMUSCULAR | Status: DC | PRN
  Administered 2011-05-17: 50 ug via INTRAVENOUS
  Administered 2011-05-17 (×2): 25 ug via INTRAVENOUS
  Administered 2011-05-17: 50 ug via INTRAVENOUS
  Administered 2011-05-17 (×2): 25 ug via INTRAVENOUS

## 2011-05-17 MED ORDER — MEPERIDINE HCL 50 MG/5ML PO SOLN: 50.0000 mg | Freq: Once | ORAL | Status: DC

## 2011-05-17 MED ORDER — MEPERIDINE HCL 50 MG PO TABS: 50.0000 mg | ORAL_TABLET | ORAL | Status: AC | PRN

## 2011-05-17 MED ORDER — ONDANSETRON HCL 4 MG/2ML IJ SOLN
INTRAMUSCULAR | Status: DC | PRN
  Administered 2011-05-17: 4 mg via INTRAVENOUS

## 2011-05-17 MED ORDER — PROPOFOL 10 MG/ML IV EMUL
INTRAVENOUS | Status: DC | PRN
  Administered 2011-05-17: 50 ug/kg/min via INTRAVENOUS

## 2011-05-17 MED ORDER — ROPIVACAINE HCL 5 MG/ML IJ SOLN
INTRAMUSCULAR | Status: DC | PRN
  Administered 2011-05-17: 30 mL

## 2011-05-17 SURGICAL SUPPLY — 32 items
BANDAGE ELASTIC 6 VELCRO ST LF (GAUZE/BANDAGES/DRESSINGS) ×2 IMPLANT
BANDAGE GAUZE ELAST BULKY 4 IN (GAUZE/BANDAGES/DRESSINGS) ×2 IMPLANT
BLADE 4.2CUDA (BLADE) ×2 IMPLANT
BOOTIES KNEE HIGH SLOAN (MISCELLANEOUS) ×2 IMPLANT
CANISTER SUCT LVC 12 LTR MEDI- (MISCELLANEOUS) ×2 IMPLANT
CLOTH BEACON ORANGE TIMEOUT ST (SAFETY) ×2 IMPLANT
DRAPE ARTHROSCOPY W/POUCH 114 (DRAPES) ×2 IMPLANT
DRSG EMULSION OIL 3X3 NADH (GAUZE/BANDAGES/DRESSINGS) ×2 IMPLANT
DRSG PAD ABDOMINAL 8X10 ST (GAUZE/BANDAGES/DRESSINGS) IMPLANT
DURAPREP 26ML APPLICATOR (WOUND CARE) ×2 IMPLANT
GLOVE INDICATOR STER SZ 9 (GLOVE) ×2 IMPLANT
GOWN BRE IMP SLV AUR LG STRL (GOWN DISPOSABLE) ×2 IMPLANT
GOWN BRE IMP SLV AUR XL STRL (GOWN DISPOSABLE) ×2 IMPLANT
GOWN W/COTTON TOWEL STD LRG (GOWNS) ×2 IMPLANT
GOWN XL W/COTTON TOWEL STD (GOWNS) ×2 IMPLANT
IV NS IRRIG 3000ML ARTHROMATIC (IV SOLUTION) ×4 IMPLANT
KNEE WRAP E Z 3 GEL PACK (MISCELLANEOUS) ×2 IMPLANT
NDL SAFETY ECLIPSE 18X1.5 (NEEDLE) ×2 IMPLANT
NEEDLE FILTER BLUNT 18X 1/2SAF (NEEDLE) ×1
NEEDLE FILTER BLUNT 18X1 1/2 (NEEDLE) ×1 IMPLANT
NEEDLE HYPO 18GX1.5 SHARP (NEEDLE) ×2
NEEDLE HYPO 25X1 1.5 SAFETY (NEEDLE) ×2 IMPLANT
PACK ARTHROSCOPY DSU (CUSTOM PROCEDURE TRAY) ×2 IMPLANT
PACK BASIN DAY SURGERY FS (CUSTOM PROCEDURE TRAY) ×2 IMPLANT
PADDING CAST COTTON 6X4 STRL (CAST SUPPLIES) ×2 IMPLANT
SET ARTHROSCOPY TUBING (MISCELLANEOUS) ×1
SET ARTHROSCOPY TUBING LN (MISCELLANEOUS) ×1 IMPLANT
SPONGE GAUZE 4X4 12PLY (GAUZE/BANDAGES/DRESSINGS) ×2 IMPLANT
SUT ETHILON 4 0 PS 2 18 (SUTURE) ×2 IMPLANT
SYRINGE 10CC LL (SYRINGE) ×2 IMPLANT
TOWEL OR 17X24 6PK STRL BLUE (TOWEL DISPOSABLE) ×4 IMPLANT
WATER STERILE IRR 500ML POUR (IV SOLUTION) ×2 IMPLANT

## 2011-05-17 NOTE — H&P (View-Only) (Signed)
[name] [name] is an 60 y.o. female.   Chief Complaint:    Right knee pain since august HPI: 60 yo White female presents with hx of a fall in august with continued Right knee pain.  She was treated with an immobilizer initially and it was ineffective.  Meds didn't help.  An MRI was done in oct showing a tear of the post horn of the medial meniscus and a loose body in the suprapatellar pouch.  Because of the pain and loose body she is presenting for a knee arthroscopy  Past Medical History  Diagnosis Date  . Osteoporosis   . Varicosities     venous  . Back pain   . Cardiomyopathy 06/2010    EF was 25% during acute illness. Repeat echo in 11/2010 showed normal EF.   Marland Kitchen Hypertension   . Headache     Past Surgical History  Procedure Date  . Lumbar disc surgery 2011  . Tendon repair 2010  . Anterior cervical diskectomy   . Abdominal hysterectomy   . Cholecystectomy   . Hand surgery   . Knee surgery     History reviewed. No pertinent family history. Social History:  reports that she has never smoked. She does not have any smokeless tobacco history on file. She reports that she does not drink alcohol. Her drug history not on file.  Allergies:  Allergies  Allergen Reactions  . Latex Itching  . Morphine And Related Hives and Itching  . Aspirin   . Codeine   . Darvocet (Propoxyphene N-Acetaminophen)   . Percocet (Oxycodone-Acetaminophen)   . Vicodin (Hydrocodone-Acetaminophen)     Medications Prior to Admission  Medication Sig Dispense Refill  . albuterol (PROVENTIL HFA;VENTOLIN HFA) 108 (90 BASE) MCG/ACT inhaler Inhale 2 puffs into the lungs every 4 (four) hours as needed.        . ALPRAZolam (XANAX) 0.5 MG tablet Take 0.5 mg by mouth at bedtime as needed.        Marland Kitchen aspirin 81 MG tablet Take 81 mg by mouth daily.        . bisoprolol-hydrochlorothiazide (ZIAC) 5-6.25 MG per tablet Take 1 tablet by mouth daily.        . Calcium Carbonate-Vit D-Min 1200-1000 MG-UNIT CHEW Chew by  mouth 2 (two) times daily.        . cyclobenzaprine (FLEXERIL) 10 MG tablet Take 10 mg by mouth. Takes as needed.       . diazepam (VALIUM) 10 MG tablet Take 10 mg by mouth every 6 (six) hours as needed.        Marland Kitchen esomeprazole (NEXIUM) 40 MG capsule Take 40 mg by mouth daily.        Marland Kitchen estrogens, conjugated, (PREMARIN) 1.25 MG tablet Take 1.25 mg by mouth daily.        Marland Kitchen HYDROmorphone (DILAUDID) 4 MG tablet Take 4 mg by mouth every 4 (four) hours.        . ibandronate (BONIVA) 150 MG tablet Take 150 mg by mouth every 30 (thirty) days. Take in the morning with a full glass of water, on an empty stomach, and do not take anything else by mouth or lie down for the next 30 min.       Marland Kitchen oxyCODONE (OXYCONTIN) 15 MG TB12 Take 15 mg by mouth every 4 (four) hours.        . vitamin B-12 (CYANOCOBALAMIN) 1000 MCG tablet Take 1,000 mcg by mouth daily.  No current facility-administered medications on file as of 05/10/2011.    No results found for this or any previous visit (from the past 48 hour(s)). No results found.  Review of Systems  Constitutional: Positive for weight loss.  Eyes: Negative.   Respiratory: Negative.   Cardiovascular: Negative.   Gastrointestinal: Negative.   Genitourinary: Negative.   Musculoskeletal: Positive for joint pain.  Skin: Negative.   Neurological: Positive for headaches.  Endo/Heme/Allergies: Negative.   Psychiatric/Behavioral: Negative.     There were no vitals taken for this visit. Physical Exam  Constitutional: She is oriented to person, place, and time. She appears well-developed and well-nourished.  HENT:  Head: Normocephalic and atraumatic.  Eyes: Pupils are equal, round, and reactive to light.  Neck: Normal range of motion.  Cardiovascular: Normal rate.   Respiratory: Effort normal.  GI: Soft.  Musculoskeletal:       Right shoulder: Normal.       Left shoulder: Normal.       Right elbow: Normal.      Left elbow: Normal.       Right wrist:  Normal.       Left wrist: Normal.       Right hip: Normal.       Left hip: Normal.       Right knee: tenderness found. Medial joint line tenderness noted.       Left knee: Normal.       Right ankle: Normal.       Left ankle: Normal.       Legs: Neurological: She is alert and oriented to person, place, and time.  Skin: Skin is warm and dry.  Psychiatric: She has a normal mood and affect. Her behavior is normal.     Assessment/Plan Torn medial meniscus Right knee with loose body HTN Hx of Cardiomyopathy  Admit for an outpatient Right knee arthroscopy with removal of loose body.  [name] [name] 05/10/2011, 8:52 AM

## 2011-05-17 NOTE — Anesthesia Procedure Notes (Addendum)
Anesthesia Regional Block:    Pre-Anesthetic Checklist: ,, timeout performed, Correct Patient, Correct Site, Correct Laterality, Correct Procedure, Correct Position, site marked, Risks and benefits discussed, Surgical consent, Pre-op evaluation  Laterality: Right  Prep: Betadine       Needles:      Needle Length: 5cm 5 cm Needle Gauge: 18 and 18 G    Additional Needles:  Narrative:   Additional Notes: Knee intraarticular block performed at patient's request. Tolerated well. Ropivacaine 0.5% with epi 1:200000   Performed by: Lorrin Jackson    Date/Time: 05/17/2011 9:29 AM Performed by: Lorrin Jackson Patient Re-evaluated:Patient Re-evaluated prior to inductionOxygen Delivery Method: Simple face mask Placement Confirmation: positive ETCO2

## 2011-05-17 NOTE — Anesthesia Postprocedure Evaluation (Signed)
  Anesthesia Post-op Note  Patient: [name] [name]  Procedure(s) Performed:  ARTHROSCOPY KNEE - WITH MEDIAL MENISECTOMY AND removal of suprapatella fat lump  Patient Location: PACU  Anesthesia Type: MAC  Level of Consciousness: awake and alert   Airway and Oxygen Therapy: Patient Spontanous Breathing  Post-op Pain: mild  Post-op Assessment: Post-op Vital signs reviewed, Patient's Cardiovascular Status Stable, Respiratory Function Stable, Patent Airway and No signs of Nausea or vomiting  Post-op Vital Signs: stable  Complications: No apparent anesthesia complications

## 2011-05-17 NOTE — Op Note (Signed)
[name] [name], [id number], 05/17/2011, at [facility]  PROCEDURE;  Medial meniscectomy and excision suprapatellar fat nodule  ANESTHESIA; local with sedation    SURGEON;  Rendall  ASSISTANT;  none  PATHOLOGY;   none  Ebl;  Minimal  DRAIN;  None ` NOTE DICTATION [id number]  RENDALL III,[name] L

## 2011-05-17 NOTE — Transfer of Care (Signed)
Immediate Anesthesia Transfer of Care Note  Patient: [name] [name]  Procedure(s) Performed:  ARTHROSCOPY KNEE - WITH MEDIAL MENISECTOMY AND removal of suprapatella fat lump  Patient Location: Patient transported to PACU with oxygen via face mask at 6 Liters / Min  Anesthesia Type: General  Level of Consciousness: awake and alert   Airway & Oxygen Therapy: Patient Spontanous Breathing and Patient connected to face mask oxygen Post-op Assessment: Report given to PACU RN and Post -op Vital signs reviewed and stable  Post vital signs: Reviewed and stable  Complications: No apparent anesthesia complications

## 2011-05-17 NOTE — Op Note (Signed)
[name] [name], WITHERELL                ACCOUNT NO.:  [id number]  MEDICAL RECORD NO.:  [id number]  LOCATION:                                 FACILITY:  PHYSICIAN:  Pandora Mccrackin L. Rendall, M.D.  DATE OF BIRTH:  1950-08-22  DATE OF PROCEDURE:  05/17/2011 DATE OF DISCHARGE:                              OPERATIVE REPORT   INDICATION AND JUSTIFICATION FOR PROCEDURE:  Chronic medial right knee pain, unremitting with MRI positive for torn medial meniscus, posterior horn, and suprapatellar loose body of about 1/2 x 2 cm.  JUSTIFICATION FOR OUTPATIENT SETTING:  Inpatient not required.  PREOPERATIVE DIAGNOSIS:  Torn medial meniscus with suprapatellar loose body.  SURGICAL PROCEDURE:  Arthroscopic medial meniscectomy, and excision of suprapatellar fatty lump.  POSTOPERATIVE DIAGNOSIS:  Torn medial meniscus and suprapatellar fat mask.  SURGEON:  Markel Mergenthaler L. Rendall, M.D.  ANESTHESIA:  Monitored anesthesia care using local and sedation.  ASSISTANT:  None.  PROCEDURE:  Under sedation with local knee block, the leg was prepared with DuraPrep and draped as a sterile field using a leg holder. Standard portals were used.  Diagnostic arthroscopy reveals a posterior horn flap tear of the medial meniscus.  Using a combination of basket forceps and intra-articular shaver through both medial and lateral portals, these were resected back to stable meniscal rim.  Cruciate ligaments were reviewed and were intact.  The lateral meniscus was intact.  No arthritic change there.  In the medial compartment where the torn meniscus was, there were grade 2 and 3 changes on the femoral condyle and adjacent tibia.  These were relatively limited; however, and not widespread.  There are in the central weightbearing area.  Looking at the suprapatellar pouch, no calcified loose body was found, but essentially fatty tumor that was tethered to local fat tissue was found and this was what was migrating back and forth across the  suprapatellar pouch causing irritation.  Using a curved intra-articular shaver, this was basically removed.  Once this was completed, the knee was copiously irrigated with saline.  The knee was infiltrated with Marcaine with epinephrine and a sterile compression bandage was applied.  The puncture wounds to the knee were left open. There are 3 of these.  The patient returned to recovery in good condition.  She was given Demerol for outpatient care, going home.  She is to follow up in the office this coming Tuesday.     Hedwig Mcfall L. Priscille Kluver, M.D.     Renato Gails  D:  05/17/2011  T:  05/17/2011  Job:  [id number]

## 2011-05-17 NOTE — Addendum Note (Signed)
Addendum  created 05/17/11 1319 by Azell Der, MD   Modules edited:Anesthesia Medication Administration

## 2011-05-17 NOTE — Interval H&P Note (Signed)
History and Physical Interval Note:   05/17/2011   9:16 AM   [name] [name]  has presented today for surgery, with the diagnosis of MEDIAL MENISCAL TEAR AND LOOSE BODY RIGHT KNEE  The various methods of treatment have been discussed with the patient and family. After consideration of risks, benefits and other options for treatment, the patient has consented to  Procedure(s): ARTHROSCOPY KNEE as a surgical intervention .  The patients' history has been reviewed, patient examined, no change in status, stable for surgery.  I have reviewed the patients' chart and labs.  Questions were answered to the patient's satisfaction.     RENDALL Cresenciano Genre  MD

## 2011-05-17 NOTE — Anesthesia Preprocedure Evaluation (Signed)
Anesthesia Evaluation  Patient identified by MRN, date of birth, ID band Patient awake    Reviewed: Allergy & Precautions, H&P , NPO status , Patient's Chart, lab work & pertinent test results  History of Anesthesia Complications (+) PONV  Airway Mallampati: II TM Distance: >3 FB Neck ROM: Full    Dental No notable dental hx.    Pulmonary neg pulmonary ROS, shortness of breath,  clear to auscultation  Pulmonary exam normal       Cardiovascular hypertension, neg cardio ROS + Valvular Problems/Murmurs Regular Normal    Neuro/Psych  Headaches, Negative Neurological ROS  Negative Psych ROS   GI/Hepatic negative GI ROS, Neg liver ROS, GERD-  Medicated,  Endo/Other  Negative Endocrine ROS  Renal/GU negative Renal ROS  Genitourinary negative   Musculoskeletal negative musculoskeletal ROS (+)   Abdominal   Peds negative pediatric ROS (+)  Hematology negative hematology ROS (+)   Anesthesia Other Findings   Reproductive/Obstetrics negative OB ROS                           Anesthesia Physical Anesthesia Plan  ASA: II  Anesthesia Plan: Regional   Post-op Pain Management:    Induction: Intravenous  Airway Management Planned: Simple Face Mask  Additional Equipment:   Intra-op Plan:   Post-operative Plan: Extubation in OR  Informed Consent: I have reviewed the patients History and Physical, chart, labs and discussed the procedure including the risks, benefits and alternatives for the proposed anesthesia with the patient or authorized representative who has indicated his/her understanding and acceptance.   Dental advisory given  Plan Discussed with: CRNA  Anesthesia Plan Comments:         Anesthesia Quick Evaluation

## 2011-05-17 NOTE — Progress Notes (Signed)
Reported to B. Johnson, RN as caregiver 

## 2011-05-17 NOTE — Addendum Note (Signed)
Addendum  created 05/17/11 1302 by Azell Der, MD   Modules edited:Anesthesia Medication Administration

## 2011-05-22 ENCOUNTER — Encounter (HOSPITAL_BASED_OUTPATIENT_CLINIC_OR_DEPARTMENT_OTHER): Payer: Self-pay | Admitting: Orthopedic Surgery

## 2011-06-14 ENCOUNTER — Ambulatory Visit: Payer: Medicare Other | Admitting: Physical Medicine & Rehabilitation

## 2011-07-06 ENCOUNTER — Encounter (HOSPITAL_COMMUNITY): Payer: Self-pay

## 2011-07-06 ENCOUNTER — Emergency Department (HOSPITAL_COMMUNITY)
Admission: EM | Admit: 2011-07-06 | Discharge: 2011-07-06 | Disposition: A | Discharge status: Home / Self Care | Payer: Medicare Other | Attending: Emergency Medicine | Admitting: Emergency Medicine

## 2011-07-06 ENCOUNTER — Emergency Department (HOSPITAL_COMMUNITY): Payer: Medicare Other

## 2011-07-06 DIAGNOSIS — I10 Essential (primary) hypertension: Secondary | ICD-10-CM | POA: Diagnosis not present

## 2011-07-06 DIAGNOSIS — R51 Headache: Secondary | ICD-10-CM | POA: Diagnosis not present

## 2011-07-06 DIAGNOSIS — G8929 Other chronic pain: Secondary | ICD-10-CM | POA: Insufficient documentation

## 2011-07-06 DIAGNOSIS — M47812 Spondylosis without myelopathy or radiculopathy, cervical region: Secondary | ICD-10-CM | POA: Diagnosis not present

## 2011-07-06 DIAGNOSIS — K219 Gastro-esophageal reflux disease without esophagitis: Secondary | ICD-10-CM | POA: Diagnosis not present

## 2011-07-06 DIAGNOSIS — R011 Cardiac murmur, unspecified: Secondary | ICD-10-CM | POA: Insufficient documentation

## 2011-07-06 DIAGNOSIS — M810 Age-related osteoporosis without current pathological fracture: Secondary | ICD-10-CM | POA: Insufficient documentation

## 2011-07-06 DIAGNOSIS — M542 Cervicalgia: Secondary | ICD-10-CM | POA: Insufficient documentation

## 2011-07-06 DIAGNOSIS — M5030 Other cervical disc degeneration, unspecified cervical region: Secondary | ICD-10-CM | POA: Diagnosis not present

## 2011-07-06 MED ORDER — ONDANSETRON 8 MG PO TBDP: 4.0000 mg | ORAL_TABLET | Freq: Once | ORAL | Status: AC

## 2011-07-06 MED ORDER — ONDANSETRON 4 MG PO TBDP
8.0000 mg | ORAL_TABLET | Freq: Once | ORAL | Status: AC
  Administered 2011-07-06: 8 mg via ORAL
  Filled 2011-07-06: qty 2

## 2011-07-06 NOTE — ED Notes (Signed)
Pt. Fell in August and hit the back of her head and neck, Since the fall, she has been having intermittent posterior neck pain and headaches.  She is on Roxicodone and Dilaudid daily, but the headaches and neck pain have increased in intensity and are coming more often

## 2011-07-06 NOTE — ED Provider Notes (Signed)
History     CSN: [id number]  Arrival date & time 07/06/11  1009   First MD Initiated Contact with Patient 07/06/11 1015      Chief Complaint  Patient presents with  . Headache    neck pain    (Consider location/radiation/quality/duration/timing/severity/associated sxs/prior treatment) HPI Comments: Patient presents the chief complaint of recurrent headache that has been occurring for the last 4-5 months, and neck pain.  Patient reports that she fell back in August and was evaluated in this emergency department for her fall and there were no acute findings at that time.  Patient has a history of cervical disk surgery and states concerned that this is still in place.  Patient also reports that she is on Roxicodone and Dilaudid daily however she feels as if her pain has worsened and is not being treated by her medications anymore. Currently she states her pain is mild and ranks it at 2/10  Patient is a 61 y.o. female presenting with headaches. The history is provided by the patient.  Headache  This is a chronic problem. The current episode started more than 1 week ago (months). The problem occurs every few hours. The problem has not changed since onset.The headache is associated with nothing. The pain is located in the bilateral region. The quality of the pain is described as dull. The pain is at a severity of 2/10. The pain is mild. The pain does not radiate. Pertinent negatives include no anorexia, no fever, no malaise/fatigue, no chest pressure, no near-syncope, no orthopnea, no palpitations, no syncope, no shortness of breath, no nausea and no vomiting. She has tried NSAIDs for the symptoms.    Past Medical History  Diagnosis Date  . Osteoporosis   . Varicosities     venous  . Cardiomyopathy HX --06/2010    EF was 25% during acute illness (PHELONEPHRITIS) Repeat echo 12-06-10 60% showed normal EF.   Marland Kitchen Hypertension   . Headache   . PONV (postoperative nausea and vomiting)   . Heart  murmur     DENIES S & S   (ECHO JUN'12 W/ CHART)  . History of chronic bronchitis   . Chronic back pain greater than 3 months duration     S/P BACK SURG'S  . Arachnoiditis BILATERAL LEGS    DUE TO MULTIPLE BACK SURG.'S  . Weakness of both legs DUE TO ARACHNOIDITIS    OCCASIONAL USES CANE  . GERD (gastroesophageal reflux disease) AND HIATIAL HERNIA    CONTROLLED W/ NEXIUM  . Blood transfusion     Past Surgical History  Procedure Date  . Lumbar fusion 11-30-10    L4 - 5  . Exploration of incision for csf leak DEC 2011  X2    POST LAMINECOTMY  . Lumbar laminectomy DEC 2011    L4 - 5  . Lumbar fusion 2000    L2 - 4  . Cervical disc surgery 2005    C5 - 6  . Anterior fusion cervical spine 2007    C5 -6  . Tendon repair JAN 2010    LEFT INDEX AND LONG FINGERS  . Left wrist tenosynectomy w/ left thumb joint repair 02-24-10  . Abdominal hysterectomy 1987    W/ BSO  . Cholecystectomy 1994  . Knee arthroscopy LEFT X3 (LAST ONE 2005)  . Carpal tunnel release RIGHT - 2001  & DEC 2011 W/ BACK SURG.  . Knee arthroscopy 05/17/2011    Procedure: ARTHROSCOPY KNEE;  Surgeon: Carlisle Beers Rendall III;  Location: [facility];  Service: Orthopedics;  Laterality: Right;  WITH MEDIAL MENISECTOMY AND removal of suprapatella fat lump    No family history on file.  History  Substance Use Topics  . Smoking status: Never Smoker   . Smokeless tobacco: Never Used  . Alcohol Use: No    OB History    Grav Para Term Preterm Abortions TAB SAB Ect Mult Living                  Review of Systems  Constitutional: Negative for fever, chills, malaise/fatigue, activity change, fatigue and unexpected weight change.  HENT: Negative for neck pain and neck stiffness.   Eyes: Negative for visual disturbance.  Respiratory: Negative for shortness of breath.   Cardiovascular: Negative for chest pain, palpitations, orthopnea, leg swelling, syncope and near-syncope.  Gastrointestinal: Negative for  nausea, vomiting, abdominal pain, constipation, rectal pain and anorexia.  Genitourinary: Negative for urgency and difficulty urinating.       Patient denies bowel and bladder incontinence.  Musculoskeletal: Negative for myalgias, back pain (neck pain ), joint swelling, arthralgias and gait problem.  Neurological: Positive for headaches. Negative for weakness and numbness.  All other systems reviewed and are negative.    Allergies  Latex; Morphine and related; Aspirin; Codeine; Darvocet; Lortab; and Percocet  Home Medications   Current Outpatient Rx  Name Route Sig Dispense Refill  . ALBUTEROL SULFATE HFA 108 (90 BASE) MCG/ACT IN AERS Inhalation Inhale 2 puffs into the lungs every 4 (four) hours as needed. For shortness of breath    . ASPIRIN 81 MG PO TABS Oral Take 81 mg by mouth daily. COATED ASA    . BISOPROLOL-HYDROCHLOROTHIAZIDE 5-6.25 MG PO TABS Oral Take 1 tablet by mouth daily.     Marland Kitchen CALCIUM CARBONATE-VIT D-MIN 1200-1000 MG-UNIT PO CHEW Oral Chew by mouth 2 (two) times daily.     . CYCLOBENZAPRINE HCL 10 MG PO TABS Oral Take 10 mg by mouth 2 (two) times daily as needed. For muscle spams    . DIAZEPAM 10 MG PO TABS Oral Take 10 mg by mouth every 6 (six) hours as needed. MUSCLE SPASMS , ALTERNATES W/ FLEXERIL    . ESOMEPRAZOLE MAGNESIUM 40 MG PO CPDR Oral Take 40 mg by mouth every morning.     Marland Kitchen ESTROGENS CONJUGATED 1.25 MG PO TABS Oral Take 1.25 mg by mouth every morning.     Marland Kitchen HYDROMORPHONE HCL 4 MG PO TABS Oral Take 4 mg by mouth every 8 (eight) hours.    . IBANDRONATE SODIUM 150 MG PO TABS Oral Take 150 mg by mouth every 30 (thirty) days. Take in the morning with a full glass of water, on an empty stomach, and do not take anything else by mouth or lie down for the next 30 min.    . OXYCODONE HCL 15 MG PO TABS Oral Take 15 mg by mouth every 8 (eight) hours.    Marland Kitchen VITAMIN B-12 1000 MCG PO TABS Oral Take 1,000 mcg by mouth daily.     Marland Kitchen VITAMIN E 100 UNITS PO CAPS Oral Take 100 Units  by mouth daily.      BP 181/101  Pulse 70  Temp(Src) 97.7 F (36.5 C) (Oral)  Resp 18  SpO2 100%  Physical Exam  Nursing note and vitals reviewed. Constitutional: She is oriented to person, place, and time. She appears well-developed and well-nourished. No distress.  HENT:  Head: Normocephalic and atraumatic.  Eyes: Conjunctivae and EOM are normal.  Pupils are equal, round, and reactive to light. No scleral icterus.  Neck: Normal range of motion and full passive range of motion without pain. Neck supple. No spinous process tenderness and no muscular tenderness present. No rigidity. Normal range of motion present. No Brudzinski's sign noted.  Cardiovascular: Normal rate, regular rhythm and intact distal pulses.  Exam reveals no gallop and no friction rub.   No murmur heard. Pulmonary/Chest: Effort normal and breath sounds normal. No respiratory distress. She has no wheezes. She has no rales. She exhibits no tenderness.  Musculoskeletal:       Cervical back: Normal. She exhibits normal range of motion, no tenderness, no bony tenderness and no pain.       Thoracic back: She exhibits no tenderness, no bony tenderness and no pain.       Lumbar back: Normal. She exhibits no tenderness, no bony tenderness, no pain, no spasm and normal pulse.       Right foot: She exhibits no swelling.       Left foot: She exhibits no swelling.       Bilateral lower extremities nontender without color change, baseline range of motion of extremities with intact distal pulses, capillary refill less than 2 seconds bilaterally.  Pt has increased pain w ROM of lumbar spine. Pain w ambulation, no sign of ataxia.  Neurological: She is alert and oriented to person, place, and time. She has normal strength and normal reflexes. No cranial nerve deficit or sensory deficit. Gait normal.       sensation at baseline for light touch, motor symmetric bilateral 5/5 flexion, foot dorsiflexion, foot plantar flexion.  Skin: Skin is  warm and dry. No rash noted. She is not diaphoretic. No erythema. No pallor.  Psychiatric: She has a normal mood and affect.    ED Course  Procedures (including critical care time)  Labs Reviewed - No data to display Dg Cervical Spine Complete  07/06/2011  *RADIOLOGY REPORT*  Clinical Data: Neck pain  CERVICAL SPINE - COMPLETE 4+ VIEW  Comparison: 02/26/2011  Findings: Five views of cervical spine submitted.  Stable anterior and posterior fusion material at C5-C6 level.  Mild anterior spurring lower endplate of C4 and C6 vertebral body is stable. Stable mild disc space flattening at C4-C5 and C6-C7 level.  The C1- C2 relationship is unremarkable.  No prevertebral soft tissue swelling.  Cervical airway is patent.  There is mild narrowing of the right neural foramina at C4-C5 level.  Mild narrowing of the left neural foramina at C3 level and C7 level.  IMPRESSION: No acute fracture or subluxation.  Stable postsurgical changes. Degenerative changes as described above.  Original Report Authenticated By: Natasha Mead, M.D.     No diagnosis found.  Discussed results of cervical spine x-ray with patient.  We also discussed possible causes of her headache including postconcussive syndrome, HTN  and narcotic-induced headaches.  Patient states she will followup with her primary care doctor in regards to her headaches and with her neurosurgeon for her neck pain. Pt BP re-check prior to dc was 130/79  MDM  Chronic neck pain, HTN  HA   D/t no neurological deficits on PE it is likely that pts HA are caused by either post concussive syn, high BP,  or are narcotic induced. I do not feel that further work up is indicating bc of chronicity of symptoms and normal PE. I discussed this case with Dr. Golda Acre who agrees with my plan to dc pt w follow up.  Jaci Carrel, PA-C 07/06/11 [address] [place] 07/06/11 1207

## 2011-07-07 NOTE — ED Provider Notes (Signed)
Medical screening examination/treatment/procedure(s) were performed by non-physician practitioner and as supervising physician I was immediately available for consultation/collaboration.   Danie Diehl, MD 07/07/11 0900 

## 2011-07-24 ENCOUNTER — Encounter: Payer: Medicare Other | Attending: Physical Medicine & Rehabilitation | Admitting: Physical Medicine & Rehabilitation

## 2011-07-24 DIAGNOSIS — M542 Cervicalgia: Secondary | ICD-10-CM | POA: Insufficient documentation

## 2011-07-24 DIAGNOSIS — J4489 Other specified chronic obstructive pulmonary disease: Secondary | ICD-10-CM | POA: Insufficient documentation

## 2011-07-24 DIAGNOSIS — I10 Essential (primary) hypertension: Secondary | ICD-10-CM | POA: Insufficient documentation

## 2011-07-24 DIAGNOSIS — M810 Age-related osteoporosis without current pathological fracture: Secondary | ICD-10-CM | POA: Diagnosis not present

## 2011-07-24 DIAGNOSIS — M129 Arthropathy, unspecified: Secondary | ICD-10-CM | POA: Diagnosis not present

## 2011-07-24 DIAGNOSIS — G8929 Other chronic pain: Secondary | ICD-10-CM | POA: Insufficient documentation

## 2011-07-24 DIAGNOSIS — IMO0001 Reserved for inherently not codable concepts without codable children: Secondary | ICD-10-CM | POA: Diagnosis not present

## 2011-07-24 DIAGNOSIS — IMO0002 Reserved for concepts with insufficient information to code with codable children: Secondary | ICD-10-CM

## 2011-07-24 DIAGNOSIS — M5412 Radiculopathy, cervical region: Secondary | ICD-10-CM

## 2011-07-24 DIAGNOSIS — J449 Chronic obstructive pulmonary disease, unspecified: Secondary | ICD-10-CM | POA: Insufficient documentation

## 2011-07-24 DIAGNOSIS — M961 Postlaminectomy syndrome, not elsewhere classified: Secondary | ICD-10-CM | POA: Insufficient documentation

## 2011-07-24 NOTE — Progress Notes (Signed)
[name] [name] was last seen in this office 9 years ago.  We were treating her for cervical complaints and radiculopathy.  She has had multiple low back surgeries since I saw her.  Her last was in June of this year which was a fusion at the L4-L5 level.  She has had complications with multiple surgeries resulting in chronic arachnoiditis which is causing persistent pain in both legs.  Pain is tingling and burning and is worse at night, but bothers her throughout the day.  She has also had a fall apparently in September related to her poor balance overall and struck her head.  There has been concern since then of headaches and increased cervicalgia.  She saw Dr. Jeral Fruit apparently in September who performed x- rays which were essentially negative.  She rates her pain as 7-8/10. She has pain from the head into both arms.  The neck seems to be the epicenter of the pain.  She also has the symptoms noted above in both legs.  Pain is made worse by walking, sitting and most activities.  Does improve some with heat and medications.  She feels that the Dilaudid she uses 4 mg 1 q.8 hours of seems to be the most beneficial.  She is on oxycodone 15 mg q.8 hours as well p.r.n. and diazepam 10 mg q.8 hours p.r.n.  She is on no long-acting agents.  She has been on a few different anticonvulsants over the years including Neurontin and Lyrica, but she could not remember any others.  She had to use amitriptyline and nortriptyline in the past.  REVIEW OF SYSTEMS:  Notable for the above.  She does report some anxiety, dizziness, nausea and weight loss.  Full 12-point review is in written health and history section of the chart.  MEDICATIONS: 1. Ziac 5/6.25 one daily. 2. Oxycodone 15 mg 1 q.8 h. p.r.n. 3. Nexium 40 mg daily. 4. Hydromorphone 4 mg 1 q.8 h. p.r.n. 5. Premarin 1.25 daily. 6. Diazepam 10 mg q.8 h. p.r.n. 7. Benazepril 25 mg t.i.d. p.r.n. 8. Xopenex b.i.d. p.r.n. 9. Zofran 8 mg half daily  p.r.n. 10.Boniva 100 mg 1 every month. 11.Calcium plus D 1000 mg b.i.d. 12.Vitamin B12 daily.  ALLERGIES:  None.  PAST MEDICAL HISTORY: 1. Notable for hypertension, osteoporosis, arthritis, asthma/COPD. 2. Five lumbar surgeries and two cervical surgeries. 3. Three left knee surgeries and one right knee arthroscopic surgery     done in November of this past year after her fall. 4. History of carpal tunnel syndrome, right hand surgery x2. 5. Hysterectomy. 6. Fusion of the fourth finger and middle fingers of the left hand. 7. Gallbladder complaints with removal. 8. Two breast biopsies.  SOCIAL HISTORY:  The patient is married, living with her son.  She denies alcohol or tobacco.  FAMILY HISTORY:  Positive for heart disease, diabetes andhigh blood pressure.  IMAGING:  Most recent MRI from March of last year shows mild anterolisthesis at L4-L5 without any obvious nerve root impingement. The nerve roots are symmetrically positioned within the thecal sac suggestive of a possible arachnoiditis.  There are no signs of fluid leaks or any other obvious abnormalities.  PHYSICAL EXAMINATION:  VITAL SIGNS:  Blood pressure is 115/49, pulse 76, respiratory rate is 16 and she is satting 97% on room air. GENERAL:  The patient is generally pleasant and alert.  She is in her gown. EXTREMITIES:  Strength in the upper extremity is grossly 4/5 right shoulder, 4+ to 5/5 left shoulder.  She is grossly  4+ to 5/5 throughout except hand intrinsics which are 3+ to 4.  She has some diminishment in her sensation and a scattered pattern of both upper extremities. Standing posture is fair.  Sitting posture is fair.  She had a positive Spurling's maneuver on either side today.  She has generalized tenderness with palpation over the mid trapezius into the lower cervical paraspinal muscles.  Levator scapulae is remarkable.  Low back is generally notable for multiple scars.  She has a levoscoliosis as  well emanating from L3 and ending in the lower thoracic spine.  Straight leg testing was notable for hamstring tightness and paresthesias in each leg.  Strength is grossly 4-5/5 in both lower extremity limbs with no focal weaknesses seen.  Sensory exam is grossly intact in all 4 limbs. Reflexes are 2+ in the upper extremities, 1+ in the lower extremities and she had a hard time relaxing in seated.  ASSESSMENT: 1. 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  She has documented C6 radiculitis in the past.  Some     of her chronic neck issues may have been exacerbated by her recent     fall. 2. Post lumbar post laminectomy syndrome.  The patient may have     arachnoiditis as well causing bilateral lower extremity     paresthesias.  PLAN: 1. We will initiate a trial of anticonvulsant.  We used Topamax 25 mg     increasing to 75 mg over 10 days as tolerated. 2. We discussed possibility of a spinal stimulator to treat some of     this nerve pain she is experiencing. 3. Consider long-acting opiate. 4. Urine drug screen was collected today as we prescribed no narcotics     at first visit. 5. We discussed idea of pursuing some gentle physical therapy to     improve range of motion and activity tolerance especially in the     low back. 6. It would be worthwhile to see Dr. Jeral Fruit in follow up at some     point.  She is having some new right shoulder weakness and     paresthesias into the arms.  However, she has had these I believe     in the past before.  I would watch closely for now and observe with     the above-mentioned treatment plan. 7. I will see her back here in about 1 month's time.     Ranelle Oyster, M.D. Electronically Signed    ZTS/MedQ D:  07/24/2011 13:12:51  T:  07/24/2011 19:16:57  Job #:  [id number]  cc:   Hilda Lias, M.D. Fax: [phone]  Lia Hopping, MD Fax: [phone]

## 2011-07-25 DIAGNOSIS — K219 Gastro-esophageal reflux disease without esophagitis: Secondary | ICD-10-CM | POA: Diagnosis not present

## 2011-07-25 DIAGNOSIS — F411 Generalized anxiety disorder: Secondary | ICD-10-CM | POA: Diagnosis not present

## 2011-07-25 DIAGNOSIS — I10 Essential (primary) hypertension: Secondary | ICD-10-CM | POA: Diagnosis not present

## 2011-07-25 DIAGNOSIS — J019 Acute sinusitis, unspecified: Secondary | ICD-10-CM | POA: Diagnosis not present

## 2011-07-25 DIAGNOSIS — M549 Dorsalgia, unspecified: Secondary | ICD-10-CM | POA: Diagnosis not present

## 2011-08-28 ENCOUNTER — Encounter: Payer: Medicare Other | Attending: Physical Medicine & Rehabilitation | Admitting: Physical Medicine & Rehabilitation

## 2011-08-28 ENCOUNTER — Encounter: Payer: Self-pay | Admitting: Physical Medicine & Rehabilitation

## 2011-08-28 VITALS — BP 142/74 | HR 74 | Resp 14 | Ht 65.0 in | Wt 168.0 lb

## 2011-08-28 DIAGNOSIS — M5412 Radiculopathy, cervical region: Secondary | ICD-10-CM | POA: Diagnosis not present

## 2011-08-28 DIAGNOSIS — Z5181 Encounter for therapeutic drug level monitoring: Secondary | ICD-10-CM | POA: Diagnosis not present

## 2011-08-28 DIAGNOSIS — Z79899 Other long term (current) drug therapy: Secondary | ICD-10-CM | POA: Diagnosis not present

## 2011-08-28 DIAGNOSIS — M961 Postlaminectomy syndrome, not elsewhere classified: Secondary | ICD-10-CM | POA: Diagnosis not present

## 2011-08-28 DIAGNOSIS — F419 Anxiety disorder, unspecified: Secondary | ICD-10-CM

## 2011-08-28 DIAGNOSIS — F411 Generalized anxiety disorder: Secondary | ICD-10-CM

## 2011-08-28 DIAGNOSIS — G039 Meningitis, unspecified: Secondary | ICD-10-CM | POA: Diagnosis not present

## 2011-08-28 LAB — POCT URINE DRUG SCREEN

## 2011-08-28 MED ORDER — OXYCODONE HCL 15 MG PO TABS: 15.0000 mg | ORAL_TABLET | Freq: Three times a day (TID) | ORAL | Status: DC | PRN

## 2011-08-28 MED ORDER — PREGABALIN 75 MG PO CAPS: 75.0000 mg | ORAL_CAPSULE | Freq: Three times a day (TID) | ORAL | Status: DC

## 2011-08-28 MED ORDER — OXYMORPHONE HCL ER 10 MG PO TB12: 10.0000 mg | ORAL_TABLET | Freq: Two times a day (BID) | ORAL | Status: AC

## 2011-08-28 MED ORDER — METHOCARBAMOL 750 MG PO TABS: 750.0000 mg | ORAL_TABLET | Freq: Four times a day (QID) | ORAL | Status: AC

## 2011-08-28 NOTE — Patient Instructions (Signed)
Stop neurontin once you start the lyrica

## 2011-08-28 NOTE — Progress Notes (Signed)
Subjective:    Patient ID: [name] [name], female    DOB: 1950/10/26, 61 y.o.   MRN: [id number]  HPI [name] [name] is back regarding her arm, leg,  Back, and neck pain.  Right sided symptoms are usually worse than the left.  Insurance wouldn't cover topamax, so we trialed neurontin which wasn't of substantial benefit. She feels that she may be stumbling more with impaired balance at times.  She only got her oxycodone refilled.  She has been out of her dilaudid and valium for about 2 weeks.  Her anxiety has increased over the last few weeks because of the ongoing issues noted above. Her bowels and bladder are working fairly well     I reviewed her Cervical CT from 9.9.12 which showed: Degenerative facet arthropathy is present at multiple levels, and  there is mild posterior osseous ridging at the C4-5 level  contributing to mild right-sided osseous foraminal stenosis. There  is also facet and uncinate spurring on the left at C6-7  contributing to the left osseous foraminal stenosis.   Pain Inventory Average Pain 8 Pain Right Now 8 My pain is burning, dull, stabbing, tingling and aching  In the last 24 hours, has pain interfered with the following? General activity 10 Relation with others 8 Enjoyment of life 10 What TIME of day is your pain at its worst? All Day Sleep (in general) Poor  Pain is worse with: walking, sitting, standing and some activites Pain improves with: rest, heat/ice and medication Relief from Meds: 5  Mobility use a cane how many minutes can you walk? 10-15 minutes ability to climb steps?  yes do you drive?  yes  Function disabled: date disabled 2000 I need assistance with the following:  household duties and shopping  Neuro/Psych weakness numbness tingling trouble walking spasms anxiety  Prior Studies Any changes since last visit?  no  Physicians involved in your care Any changes since last visit?  no  Review of Systems  Constitutional: Negative.    HENT: Negative.   Eyes: Negative.   Respiratory: Negative.   Cardiovascular: Negative.   Gastrointestinal: Negative.   Genitourinary: Negative.   Musculoskeletal: Negative.   Skin: Negative.   Neurological: Positive for numbness.  Hematological: Negative.   Psychiatric/Behavioral: Negative.        Objective:   Physical Exam  Constitutional: She is oriented to person, place, and time. She appears well-developed.  HENT:  Head: Normocephalic and atraumatic.  Eyes: EOM are normal. Pupils are equal, round, and reactive to light.  Neck: Normal range of motion. Neck supple.  Cardiovascular: Normal rate.   Pulmonary/Chest: Effort normal.  Abdominal: Soft.  Musculoskeletal:       She has mild pain with range of motion. Neck flexion seem to bother her neck more than extension. She has fair range of motion. She has poor sitting posture. No gross joint deformities are seen in the upper extremities. Lower back is somewhat limited to flexion more than extension. She has a modified posture with some flexion in standing noted. Straight leg rest raising is equivocally positive. Did not test her for Patrick's test. She has tenderness with palpation of lower lumbar spine segments and to the SI joints.  Neurological: She is alert and oriented to person, place, and time.  Reflex Scores:      Tricep reflexes are 2+ on the right side and 2+ on the left side.      Bicep reflexes are 2+ on the right side and 2+ on the  left side.      Brachioradialis reflexes are 2+ on the right side and 2+ on the left side.      Patellar reflexes are 1+ on the right side and 1+ on the left side.      Achilles reflexes are 1+ on the right side and 1+ on the left side.      Patient alert and oriented x3. Cognitively she is appropriate. She has an equivocal Spurling's test on the right. In testing the right upper extremity strength, she has grossly 3+ to 45 strength throughout the right arm. She's perhaps 4/5 in the left. Her  handgrip appears to be the weakest of the areas. She has subjectively some diminished pinprick on the right side but was inconsistent on exam. Lower extremity strength grossly 4/5 with pain in addition proximally. Again she has some variable loss in pinprick below the waist on either side.  Skin: Skin is warm.  Psychiatric: She has a normal mood and affect. Her behavior is normal. Judgment and thought content normal.          Assessment & Plan:  ASSESSMENT:  1. 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 She has documented C6 radiculitis in the past. I don't see any definitive signs of acute radiculopathy.  Pain seems to be worse, however, since her fall. CT of her C-spine in September really didn't show a lot of change.. 2. Post lumbar post laminectomy syndrome.  3. 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 4. Mild anxiety    PLAN:  1. We will convert over from gabapentin to lyrica.  Instructions were provided. We will titrate to 75 mg 3 times a day. 2. We discussed possibility of a spinal stimulator at last visit to treat some of  this nerve pain she is experiencing as an end of the line option. There should be no imminent plans for this at present however. 3. Initiate a trial of Opana ER 10mg  q12. Filled oxycodone for breakthrough pain. 4. Urine drug collected today.  5. Consider right C4-C5 trans ESI depending on response to more conservative measures. I'm not convinced that a steroid injection would necessarily improve all these pain complaints in the right arm.  6. I want her to see Dr. Jeral Fruit in f/u regarding any potential surgical considerations although I don't see any acute neurological changes on exam.  Most of her exam findings are related to pain.  Anxiety is also playing a role here. I would like to get his input on to whether he feels a C4-C5 ESI would be beneficial in this case. 7. Added robaxin for muscle  spasms 8. I'll see the patient back in about one month's time.

## 2011-09-05 ENCOUNTER — Telehealth: Payer: Self-pay | Admitting: Physical Medicine & Rehabilitation

## 2011-09-05 NOTE — Telephone Encounter (Signed)
Spoke with [name] [name] and told her we did not need to refer her since she was already a patient of Dr Jeral Fruit, and that he should have access to Dr Rosalyn Charters note in [place]. She said she would call them back since she had not heard from them about an appointment.

## 2011-09-05 NOTE — Telephone Encounter (Signed)
Patient here last week.  Was Dr going to refer to Dr Jeral Fruit?

## 2011-09-21 ENCOUNTER — Encounter: Payer: Self-pay | Admitting: Physical Medicine & Rehabilitation

## 2011-09-26 ENCOUNTER — Other Ambulatory Visit: Payer: Self-pay | Admitting: *Deleted

## 2011-09-28 DIAGNOSIS — M545 Low back pain: Secondary | ICD-10-CM | POA: Diagnosis not present

## 2011-09-28 DIAGNOSIS — M5412 Radiculopathy, cervical region: Secondary | ICD-10-CM | POA: Diagnosis not present

## 2011-09-28 DIAGNOSIS — IMO0002 Reserved for concepts with insufficient information to code with codable children: Secondary | ICD-10-CM | POA: Diagnosis not present

## 2011-09-28 DIAGNOSIS — M542 Cervicalgia: Secondary | ICD-10-CM | POA: Diagnosis not present

## 2011-09-29 ENCOUNTER — Encounter: Payer: Self-pay | Admitting: Physical Medicine & Rehabilitation

## 2011-09-29 ENCOUNTER — Encounter: Payer: Medicare Other | Attending: Physical Medicine & Rehabilitation | Admitting: Physical Medicine & Rehabilitation

## 2011-09-29 VITALS — BP 148/62 | HR 71 | Resp 16 | Ht 65.0 in | Wt 159.0 lb

## 2011-09-29 DIAGNOSIS — F411 Generalized anxiety disorder: Secondary | ICD-10-CM

## 2011-09-29 DIAGNOSIS — M5412 Radiculopathy, cervical region: Secondary | ICD-10-CM

## 2011-09-29 DIAGNOSIS — M961 Postlaminectomy syndrome, not elsewhere classified: Secondary | ICD-10-CM

## 2011-09-29 DIAGNOSIS — F419 Anxiety disorder, unspecified: Secondary | ICD-10-CM

## 2011-09-29 DIAGNOSIS — G039 Meningitis, unspecified: Secondary | ICD-10-CM | POA: Diagnosis not present

## 2011-09-29 DIAGNOSIS — F341 Dysthymic disorder: Secondary | ICD-10-CM

## 2011-09-29 DIAGNOSIS — F418 Other specified anxiety disorders: Secondary | ICD-10-CM

## 2011-09-29 MED ORDER — OXYCODONE HCL 15 MG PO TABS: 15.0000 mg | ORAL_TABLET | Freq: Three times a day (TID) | ORAL | Status: DC | PRN

## 2011-09-29 MED ORDER — VENLAFAXINE HCL 50 MG PO TABS: 50.0000 mg | ORAL_TABLET | Freq: Two times a day (BID) | ORAL | Status: DC

## 2011-09-29 MED ORDER — PREGABALIN 100 MG PO CAPS: 100.0000 mg | ORAL_CAPSULE | Freq: Four times a day (QID) | ORAL | Status: DC

## 2011-09-29 MED ORDER — OXYMORPHONE HCL ER 30 MG PO TB12: 30.0000 mg | ORAL_TABLET | Freq: Two times a day (BID) | ORAL | Status: AC

## 2011-09-29 NOTE — Patient Instructions (Signed)
Lyrica: begin three x per day for 5 days then increase to 4 x per day.  Begin effexor in one week.

## 2011-09-29 NOTE — Progress Notes (Signed)
Subjective:    Patient ID: [name] [name], female    DOB: Jun 24, 1950, 61 y.o.   MRN: [id number]  HPI  Elishia is back regarding her chronic cervicalgia and pain syndrome.  The opana and lyrica didn't make a huge difference although she had no problems tolerating them.  The robaxin made her nauseas.  She has had a few falls at home recently.  One of the falls she had was when she was coming down off a step stool after trying to hang a picture. She also got tripped up in the bathroom.    The pain and neurological symptoms have been frustrating to her and she's had increasing depression as well as anxiety as a result. Sleep has been affected as well. Pain is most severe in her low back, legs, then her upper extremities.  She has to lay flat in her bed for her neck not to hurt.  She saw Dr. Jeral Fruit yesterday and he recommended a cervical myelogram.  She has a UTI currently, but denies any symptoms consistent with neurogenic bowel or bladder..  The only thing that she feels relieves her pain is the roxicodone but the effects are fleeting.  Pain Inventory Average Pain 7 Pain Right Now 8 My pain is sharp, burning, stabbing, tingling and aching  In the last 24 hours, has pain interfered with the following? General activity 8 Relation with others 9 Enjoyment of life 10 What TIME of day is your pain at its worst? All Day Sleep (in general) Fair  Pain is worse with: walking, sitting, standing and some activites Pain improves with: rest, heat/ice and medication Relief from Meds: 5  Mobility use a cane how many minutes can you walk? Maybe 10 ability to climb steps?  yes do you drive?  yes  Function disabled: date disabled   Neuro/Psych weakness numbness tingling trouble walking spasms anxiety  Prior Studies Any changes since last visit?  no  Physicians involved in your care Any changes since last visit?  no   Review of Systems  Constitutional: Positive for appetite change and  unexpected weight change.  HENT: Negative.   Eyes: Negative.   Respiratory: Negative.   Cardiovascular: Negative.   Gastrointestinal: Positive for nausea, abdominal pain and diarrhea.  Genitourinary: Positive for dysuria.  Musculoskeletal: Negative.   Skin: Negative.   Neurological: Positive for weakness and numbness.  Hematological: Negative.   Psychiatric/Behavioral: Negative.        Objective:   Physical Exam  Constitutional: She is oriented to person, place, and time. She appears well-developed and well-nourished.  HENT:  Head: Normocephalic and atraumatic.  Eyes: Conjunctivae and EOM are normal. Pupils are equal, round, and reactive to light.  Neck: Normal range of motion. Neck supple.  Cardiovascular: Normal rate and regular rhythm.   Pulmonary/Chest: Effort normal and breath sounds normal.  Abdominal: Soft. Bowel sounds are normal. She exhibits no distension.  Musculoskeletal:       Cervical back: She exhibits decreased range of motion and tenderness.       Lumbar back: She exhibits decreased range of motion, tenderness, bony tenderness and spasm.  Neurological: She is alert and oriented to person, place, and time. No cranial nerve deficit.       She has some subjective weakness in the upper extremities but this is really mild. Strength grossly 4/5. No focal sensory  findings are seen in the arms. Spurling's test is equivocal. Lower extremities do show some mild sensory loss at 1-1+ out of 2.  Again lower extremities show no gross weakness. Reflexes are generally 1+ throughout.  Psychiatric: She has a normal mood and affect. Her behavior is normal. Judgment and thought content normal.          Assessment & Plan:  ASSESSMENT:  1. 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 She has documented C6 radiculitis in the past. I  still don't see any definitive signs of acute radiculopathy. Pain seems to be worse, however, since her fall. Dr.  Jeral Fruit has scheduled a myelogram  2. Post lumbar post laminectomy syndrome.  3. 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 4. Anxiety/depression secondary to the above   PLAN:  1. Titrate lyrica to 100mg  qid 2. Spinal stimulator is still in the discussion. 3. Titrate opana er to 30mg  q12 #60 4. Continue oxycodone for breakthrough pain. This was refilled today.  5. Still can consider right C4-C5 trans ESI depending on response to more conservative measures. I'm not convinced that a steroid injection would necessarily improve all these pain complaints in the right arm.  6. Begin a trial of effexor for neuro pain and mood. 7. Robaxin has been stopped. 8. I'll see the patient back in about one month's time.

## 2011-10-10 ENCOUNTER — Other Ambulatory Visit: Payer: Self-pay | Admitting: Internal Medicine

## 2011-10-10 DIAGNOSIS — Z1231 Encounter for screening mammogram for malignant neoplasm of breast: Secondary | ICD-10-CM

## 2011-10-11 ENCOUNTER — Other Ambulatory Visit: Payer: Self-pay | Admitting: Neurosurgery

## 2011-10-11 DIAGNOSIS — M549 Dorsalgia, unspecified: Secondary | ICD-10-CM

## 2011-10-11 DIAGNOSIS — M542 Cervicalgia: Secondary | ICD-10-CM

## 2011-10-20 ENCOUNTER — Telehealth: Payer: Self-pay | Admitting: Physical Medicine & Rehabilitation

## 2011-10-20 DIAGNOSIS — M961 Postlaminectomy syndrome, not elsewhere classified: Secondary | ICD-10-CM

## 2011-10-20 DIAGNOSIS — G039 Meningitis, unspecified: Secondary | ICD-10-CM

## 2011-10-20 DIAGNOSIS — M5412 Radiculopathy, cervical region: Secondary | ICD-10-CM

## 2011-10-20 NOTE — Telephone Encounter (Signed)
Will need refil on Opana on the 11th, and refill on Roxi by 12th.  Has appt on 11/06/11

## 2011-10-23 ENCOUNTER — Telehealth: Payer: Self-pay

## 2011-10-23 DIAGNOSIS — I10 Essential (primary) hypertension: Secondary | ICD-10-CM | POA: Diagnosis not present

## 2011-10-23 MED ORDER — OXYMORPHONE HCL ER 30 MG PO TB12: 30.0000 mg | ORAL_TABLET | Freq: Two times a day (BID) | ORAL | Status: DC

## 2011-10-23 MED ORDER — OXYCODONE HCL 15 MG PO TABS: 15.0000 mg | ORAL_TABLET | Freq: Three times a day (TID) | ORAL | Status: DC | PRN

## 2011-10-23 NOTE — Telephone Encounter (Signed)
LM to let pt know rx ready for pick up.

## 2011-10-23 NOTE — Telephone Encounter (Signed)
Pt called again requesting opana and oxy.  Waiting on them to be signed her husband has been informed.

## 2011-10-23 NOTE — Telephone Encounter (Signed)
Rx's on Swartz cart to be signed. 

## 2011-10-30 ENCOUNTER — Other Ambulatory Visit: Payer: Medicare Other

## 2011-11-01 ENCOUNTER — Ambulatory Visit: Payer: Medicare Other

## 2011-11-06 ENCOUNTER — Encounter: Payer: Medicare Other | Attending: Physical Medicine & Rehabilitation | Admitting: Physical Medicine and Rehabilitation

## 2011-11-06 ENCOUNTER — Encounter: Payer: Self-pay | Admitting: Physical Medicine and Rehabilitation

## 2011-11-06 VITALS — BP 112/72 | HR 86 | Resp 16 | Ht 65.0 in | Wt 171.0 lb

## 2011-11-06 DIAGNOSIS — M542 Cervicalgia: Secondary | ICD-10-CM | POA: Diagnosis not present

## 2011-11-06 DIAGNOSIS — M961 Postlaminectomy syndrome, not elsewhere classified: Secondary | ICD-10-CM | POA: Diagnosis not present

## 2011-11-06 DIAGNOSIS — F341 Dysthymic disorder: Secondary | ICD-10-CM | POA: Insufficient documentation

## 2011-11-06 DIAGNOSIS — M5412 Radiculopathy, cervical region: Secondary | ICD-10-CM | POA: Diagnosis not present

## 2011-11-06 DIAGNOSIS — G039 Meningitis, unspecified: Secondary | ICD-10-CM | POA: Insufficient documentation

## 2011-11-06 DIAGNOSIS — M545 Low back pain: Secondary | ICD-10-CM

## 2011-11-06 DIAGNOSIS — G5790 Unspecified mononeuropathy of unspecified lower limb: Secondary | ICD-10-CM | POA: Diagnosis not present
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
## 2011-11-06 NOTE — Patient Instructions (Signed)
Continue pain medication, continue walking program, follow up in 1 month.

## 2011-11-08 ENCOUNTER — Ambulatory Visit
Admission: RE | Admit: 2011-11-08 | Discharge: 2011-11-08 | Disposition: A | Discharge status: Home / Self Care | Payer: Medicare Other | Source: Ambulatory Visit | Attending: Neurosurgery | Admitting: Neurosurgery

## 2011-11-08 VITALS — BP 107/59 | HR 63

## 2011-11-08 DIAGNOSIS — M542 Cervicalgia: Secondary | ICD-10-CM

## 2011-11-08 DIAGNOSIS — M549 Dorsalgia, unspecified: Secondary | ICD-10-CM

## 2011-11-08 DIAGNOSIS — M5030 Other cervical disc degeneration, unspecified cervical region: Secondary | ICD-10-CM | POA: Diagnosis not present

## 2011-11-08 DIAGNOSIS — M47812 Spondylosis without myelopathy or radiculopathy, cervical region: Secondary | ICD-10-CM | POA: Diagnosis not present

## 2011-11-08 DIAGNOSIS — M5412 Radiculopathy, cervical region: Secondary | ICD-10-CM

## 2011-11-08 DIAGNOSIS — M5126 Other intervertebral disc displacement, lumbar region: Secondary | ICD-10-CM | POA: Diagnosis not present

## 2011-11-08 DIAGNOSIS — M961 Postlaminectomy syndrome, not elsewhere classified: Secondary | ICD-10-CM

## 2011-11-08 MED ORDER — ONDANSETRON HCL 4 MG/2ML IJ SOLN
4.0000 mg | Freq: Once | INTRAMUSCULAR | Status: AC
  Administered 2011-11-08: 4 mg via INTRAMUSCULAR

## 2011-11-08 MED ORDER — ALUM & MAG HYDROXIDE-SIMETH 200-200-20 MG/5ML PO SUSP
30.0000 mL | Freq: Once | ORAL | Status: AC
  Administered 2011-11-08: 30 mL via ORAL

## 2011-11-08 MED ORDER — DIAZEPAM 5 MG PO TABS
10.0000 mg | ORAL_TABLET | Freq: Once | ORAL | Status: AC
  Administered 2011-11-08: 10 mg via ORAL

## 2011-11-08 MED ORDER — MEPERIDINE HCL 100 MG/ML IJ SOLN
75.0000 mg | Freq: Once | INTRAMUSCULAR | Status: AC
  Administered 2011-11-08: 75 mg via INTRAMUSCULAR

## 2011-11-08 MED ORDER — IOHEXOL 300 MG/ML  SOLN
9.0000 mL | Freq: Once | INTRAMUSCULAR | Status: AC | PRN
  Administered 2011-11-08: 9 mL via INTRATHECAL

## 2011-11-08 NOTE — Discharge Instructions (Signed)
Myelogram Discharge Instructions  1. Go home and rest quietly for the next 24 hours.  It is important to lie flat for the next 24 hours.  Get up only to go to the restroom.  You may lie in the bed or on a couch on your back, your stomach, your left side or your right side.  You may have one pillow under your head.  You may have pillows between your knees while you are on your side or under your knees while you are on your back.  2. DO NOT drive today.  Recline the seat as far back as it will go, while still wearing your seat belt, on the way home.  3. You may get up to go to the bathroom as needed.  You may sit up for 10 minutes to eat.  You may resume your normal diet and medications unless otherwise indicated.  Drink lots of extra fluids today and tomorrow.  4. The incidence of headache, nausea, or vomiting is about 5% (one in 20 patients).  If you develop a headache, lie flat and drink plenty of fluids until the headache goes away.  Caffeinated beverages may be helpful.  If you develop severe nausea and vomiting or a headache that does not go away with flat bed rest, call [phone].  5. You may resume normal activities after your 24 hours of bed rest is over; however, do not exert yourself strongly or do any heavy lifting tomorrow. If when you get up you have a headache when standing, go back to bed and force fluids for another 24 hours.  6. Call your physician for a follow-up appointment.  The results of your myelogram will be sent directly to your physician by the following day.  7. If you have any questions or if complications develop after you arrive home, please call [phone].  Discharge instructions have been explained to the patient.  The patient, or the person responsible for the patient, fully understands these instructions.       May resume effexor on Nov 09, 2011, after 11:00 am.

## 2011-11-16 DIAGNOSIS — M5137 Other intervertebral disc degeneration, lumbosacral region: Secondary | ICD-10-CM | POA: Diagnosis not present

## 2011-11-16 DIAGNOSIS — M542 Cervicalgia: Secondary | ICD-10-CM | POA: Diagnosis not present

## 2011-11-16 DIAGNOSIS — M545 Low back pain: Secondary | ICD-10-CM | POA: Diagnosis not present

## 2011-11-16 DIAGNOSIS — M5030 Other cervical disc degeneration, unspecified cervical region: Secondary | ICD-10-CM | POA: Diagnosis not present

## 2011-11-17 ENCOUNTER — Telehealth: Payer: Self-pay

## 2011-11-17 NOTE — Telephone Encounter (Signed)
Pt will be out of opana and oxy next Saturday and is requesting her ex-husband can pick them up.

## 2011-11-17 NOTE — Telephone Encounter (Signed)
Pt is not due for refill until the 6th lm advising her to call back then.

## 2011-11-21 ENCOUNTER — Telehealth: Payer: Self-pay | Admitting: Physical Medicine & Rehabilitation

## 2011-11-21 DIAGNOSIS — M5412 Radiculopathy, cervical region: Secondary | ICD-10-CM

## 2011-11-21 DIAGNOSIS — G039 Meningitis, unspecified: Secondary | ICD-10-CM

## 2011-11-21 DIAGNOSIS — M961 Postlaminectomy syndrome, not elsewhere classified: Secondary | ICD-10-CM

## 2011-11-21 MED ORDER — OXYCODONE HCL 15 MG PO TABS: 15.0000 mg | ORAL_TABLET | Freq: Three times a day (TID) | ORAL | Status: DC | PRN

## 2011-11-21 MED ORDER — OXYMORPHONE HCL ER 30 MG PO TB12: 30.0000 mg | ORAL_TABLET | Freq: Two times a day (BID) | ORAL | Status: DC

## 2011-11-21 NOTE — Telephone Encounter (Signed)
Printed for Dr. Swartz to sign. 

## 2011-11-21 NOTE — Telephone Encounter (Signed)
Roxicodone and Opana refill.  Husband can p/u Friday.

## 2011-11-22 NOTE — Telephone Encounter (Signed)
Rx is ready for pick up.

## 2011-11-23 ENCOUNTER — Ambulatory Visit
Admission: RE | Admit: 2011-11-23 | Discharge: 2011-11-23 | Disposition: A | Discharge status: Home / Self Care | Payer: Medicare Other | Source: Ambulatory Visit | Attending: Internal Medicine | Admitting: Internal Medicine

## 2011-11-23 DIAGNOSIS — Z1231 Encounter for screening mammogram for malignant neoplasm of breast: Secondary | ICD-10-CM | POA: Diagnosis not present

## 2011-12-01 ENCOUNTER — Encounter: Payer: Medicare Other | Attending: Physical Medicine & Rehabilitation | Admitting: Physical Medicine and Rehabilitation

## 2011-12-01 ENCOUNTER — Encounter: Payer: Self-pay | Admitting: Physical Medicine and Rehabilitation

## 2011-12-01 VITALS — BP 122/82 | HR 76 | Resp 16 | Ht 62.0 in | Wt 162.0 lb

## 2011-12-01 DIAGNOSIS — M79609 Pain in unspecified limb: Secondary | ICD-10-CM | POA: Insufficient documentation

## 2011-12-01 DIAGNOSIS — I10 Essential (primary) hypertension: Secondary | ICD-10-CM | POA: Diagnosis not present

## 2011-12-01 DIAGNOSIS — M545 Low back pain, unspecified: Secondary | ICD-10-CM | POA: Diagnosis not present

## 2011-12-01 DIAGNOSIS — M542 Cervicalgia: Secondary | ICD-10-CM | POA: Insufficient documentation

## 2011-12-01 DIAGNOSIS — F411 Generalized anxiety disorder: Secondary | ICD-10-CM | POA: Insufficient documentation

## 2011-12-01 DIAGNOSIS — M810 Age-related osteoporosis without current pathological fracture: Secondary | ICD-10-CM | POA: Insufficient documentation

## 2011-12-01 DIAGNOSIS — G039 Meningitis, unspecified: Secondary | ICD-10-CM | POA: Insufficient documentation

## 2011-12-01 DIAGNOSIS — M961 Postlaminectomy syndrome, not elsewhere classified: Secondary | ICD-10-CM | POA: Diagnosis not present

## 2011-12-01 DIAGNOSIS — F329 Major depressive disorder, single episode, unspecified: Secondary | ICD-10-CM | POA: Insufficient documentation

## 2011-12-01 DIAGNOSIS — F3289 Other specified depressive episodes: Secondary | ICD-10-CM | POA: Insufficient documentation

## 2011-12-01 DIAGNOSIS — M5030 Other cervical disc degeneration, unspecified cervical region: Secondary | ICD-10-CM | POA: Insufficient documentation

## 2011-12-01 DIAGNOSIS — Z981 Arthrodesis status: Secondary | ICD-10-CM | POA: Insufficient documentation

## 2011-12-01 DIAGNOSIS — K219 Gastro-esophageal reflux disease without esophagitis: Secondary | ICD-10-CM | POA: Diagnosis not present

## 2011-12-01 DIAGNOSIS — M5412 Radiculopathy, cervical region: Secondary | ICD-10-CM

## 2011-12-01 MED ORDER — OXYCODONE HCL 15 MG PO TABS: 15.0000 mg | ORAL_TABLET | Freq: Three times a day (TID) | ORAL | Status: DC | PRN

## 2011-12-01 MED ORDER — OXYMORPHONE HCL ER 30 MG PO TB12: 30.0000 mg | ORAL_TABLET | Freq: Two times a day (BID) | ORAL | Status: DC

## 2011-12-01 NOTE — Patient Instructions (Signed)
Continue with stretching exercises, continue with exercising in the pool. Continue with medication.

## 2011-12-01 NOTE — Progress Notes (Signed)
Subjective:    Patient ID: [name] [name], female    DOB: 04/06/1951, 61 y.o.   MRN: [id number]  HPI [name] [name] is back regarding her chronic cervicalgia and pain syndrome. The  lyrica didn't make a huge difference. She d/c the Lyrica, because there was no benefit. She had some problems to tolerate Effexor and d/c on her own. Pain is most severe in her low back, legs, then her upper extremities. She  She saw Dr. Jeral Fruit , after CT myelogram, and he does not consider any further other treatment at this point. The patient reports that she had a virus infection the last couple days and felt a little fatigue .  Pain Inventory Average Pain 7 Pain Right Now 9 My pain is constant, sharp, burning, stabbing, tingling and aching  In the last 24 hours, has pain interfered with the following? General activity 7 Relation with others 8 Enjoyment of life 9 What TIME of day is your pain at its worst? All Day Sleep (in general) Poor  Pain is worse with: walking, bending, sitting, standing and some activites Pain improves with: heat/ice and medication Relief from Meds: 4  Mobility use a cane how many minutes can you walk? 10-15 ability to climb steps?  yes do you drive?  yes  Function I need assistance with the following:  household duties  Neuro/Psych weakness numbness tingling trouble walking spasms anxiety  Prior Studies Any changes since last visit?  no  Physicians involved in your care Any changes since last visit?  no   Family History  Problem Relation Age of Onset  . Hypertension Mother   . Heart disease Mother   . Diabetes Mother   . Cancer Father   . Diabetes Brother    History   Social History  . Marital Status: Legally Separated    Spouse Name: N/A    Number of Children: N/A  . Years of Education: N/A   Social History Main Topics  . Smoking status: Never Smoker   . Smokeless tobacco: Never Used  . Alcohol Use: No  . Drug Use: No  . Sexually Active: None    Other Topics Concern  . None   Social History Narrative   Has one childDisabled   Past Surgical History  Procedure Date  . Lumbar fusion 11-30-10    L4 - 5  . Exploration of incision for csf leak DEC 2011  X2    POST LAMINECOTMY  . Lumbar laminectomy DEC 2011    L4 - 5  . Lumbar fusion 2000    L2 - 4  . Cervical disc surgery 2005    C5 - 6  . Anterior fusion cervical spine 2007    C5 -6  . Tendon repair JAN 2010    LEFT INDEX AND LONG FINGERS  . Left wrist tenosynectomy w/ left thumb joint repair 02-24-10  . Abdominal hysterectomy 1987    W/ BSO  . Cholecystectomy 1994  . Knee arthroscopy LEFT X3 (LAST ONE 2005)  . Carpal tunnel release RIGHT - 2001  & DEC 2011 W/ BACK SURG.  . Knee arthroscopy 05/17/2011    Procedure: ARTHROSCOPY KNEE;  Surgeon: Curlene Labrum III;  Location: [facility];  Service: Orthopedics;  Laterality: Right;  WITH MEDIAL MENISECTOMY AND removal of suprapatella fat lump   Past Medical History  Diagnosis Date  . Osteoporosis   . Varicosities     venous  . Cardiomyopathy HX --06/2010    EF was 25%  during acute illness (PHELONEPHRITIS) Repeat echo 12-06-10 60% showed normal EF.   Marland Kitchen Hypertension   . Headache   . PONV (postoperative nausea and vomiting)   . Heart murmur     DENIES S & S   (ECHO JUN'12 W/ CHART)  . History of chronic bronchitis   . Chronic back pain greater than 3 months duration     S/P BACK SURG'S  . Arachnoiditis BILATERAL LEGS    DUE TO MULTIPLE BACK SURG.'S  . Weakness of both legs DUE TO ARACHNOIDITIS    OCCASIONAL USES CANE  . Blood transfusion   . GERD (gastroesophageal reflux disease) AND HIATIAL HERNIA    CONTROLLED W/ NEXIUM   BP 122/82  Pulse 76  Resp 16  Ht 5\' 2"  (1.575 m)  Wt 162 lb (73.483 kg)  BMI 29.63 kg/m2      Review of Systems  Constitutional: Positive for appetite change.  HENT: Negative.   Eyes: Negative.   Respiratory: Negative.   Cardiovascular: Negative.    Gastrointestinal: Positive for nausea, vomiting and diarrhea.  Genitourinary: Negative.   Musculoskeletal: Positive for gait problem.  Skin: Negative.   Neurological: Positive for weakness and numbness.  Hematological: Negative.   Psychiatric/Behavioral: The patient is nervous/anxious.        Objective:   Physical Exam Constitutional: She is oriented to person, place, and time. She appears well-developed and well-nourished.  HENT:  Head: Normocephalic and atraumatic.  Eyes: Conjunctivae and EOM are normal. Pupils are equal, round, and reactive to light.  Neck: Normal range of motion. Neck supple.  Cardiovascular: Normal rate and regular rhythm.  Pulmonary/Chest: Effort normal and breath sounds normal.  Abdominal: Soft. Bowel sounds are normal. She exhibits no distension.  Musculoskeletal:  Cervical back: She exhibits decreased range of motion and tenderness.  Lumbar back: She exhibits decreased range of motion, tenderness, bony tenderness and spasm.  Neurological: She is alert and oriented to person, place, and time. No cranial nerve deficit.  She has some subjective weakness in the upper extremities but this is really mild. Strength grossly 4/5. No focal sensory findings are seen in the arms. Again lower and upper extremities show no gross weakness. Reflexes are generally 1+ throughout.  Psychiatric: She has a normal mood and affect. Her behavior is normal. Judgment and thought content normal. Tenderness in descending trapezius bilateral.        Assessment & Plan:  1. 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 She has documented C6 radiculitis in the past. I still don't see any definitive signs of acute radiculopathy. Pain seems to be worse, however, since her fall. Dr. Jeral Fruit has scheduled a myelogram  2. Post lumbar post laminectomy syndrome.  3. 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  Patient had CT myelogram done which showed severe arachnoiditis in the lumbar region, good appearance in posterior spinal fusion between L2 and L5 with no stenosis, and also good appearance in her cervical fusion C5-6 with no significant stenosis. It also showed adjacent degenerative disc disease at C4-5 without evidence of neuro compression. 4. Anxiety/depression secondary to the above  PLAN:   Her neurosurgeon is not considering further treatment options after CT-myelogram results. Patient should continue with followup with exercises and I showed her some stretches for her lumbar spine. Refilled her pain medication today, that she is not able to fill them before they are due. Patient will follow up in about  6 weeks, and then she will be on schedule with her appointment and the filling date of her prescriptions.

## 2012-01-17 ENCOUNTER — Encounter: Payer: Medicare Other | Attending: Physical Medicine & Rehabilitation | Admitting: Physical Medicine & Rehabilitation

## 2012-01-17 ENCOUNTER — Encounter: Payer: Self-pay | Admitting: Physical Medicine & Rehabilitation

## 2012-01-17 VITALS — BP 137/73 | HR 80 | Resp 14 | Ht 65.0 in | Wt 164.0 lb

## 2012-01-17 DIAGNOSIS — M961 Postlaminectomy syndrome, not elsewhere classified: Secondary | ICD-10-CM | POA: Insufficient documentation

## 2012-01-17 DIAGNOSIS — F419 Anxiety disorder, unspecified: Secondary | ICD-10-CM

## 2012-01-17 DIAGNOSIS — F341 Dysthymic disorder: Secondary | ICD-10-CM

## 2012-01-17 DIAGNOSIS — M5412 Radiculopathy, cervical region: Secondary | ICD-10-CM | POA: Diagnosis not present

## 2012-01-17 DIAGNOSIS — F418 Other specified anxiety disorders: Secondary | ICD-10-CM

## 2012-01-17 DIAGNOSIS — G039 Meningitis, unspecified: Secondary | ICD-10-CM | POA: Diagnosis not present

## 2012-01-17 DIAGNOSIS — F411 Generalized anxiety disorder: Secondary | ICD-10-CM | POA: Diagnosis not present

## 2012-01-17 MED ORDER — NORTRIPTYLINE HCL 25 MG PO CAPS: 25.0000 mg | ORAL_CAPSULE | Freq: Every day | ORAL | Status: DC

## 2012-01-17 MED ORDER — OXYMORPHONE HCL 10 MG PO TABS: 10.0000 mg | ORAL_TABLET | Freq: Three times a day (TID) | ORAL | Status: DC | PRN

## 2012-01-17 MED ORDER — OXYMORPHONE HCL ER 30 MG PO TB12: 30.0000 mg | ORAL_TABLET | Freq: Two times a day (BID) | ORAL | Status: DC

## 2012-01-17 MED ORDER — OXYCODONE HCL 15 MG PO TABS: 15.0000 mg | ORAL_TABLET | Freq: Three times a day (TID) | ORAL | Status: AC | PRN

## 2012-01-17 MED ORDER — GABAPENTIN (ONCE-DAILY) 600 MG PO TABS: 600.0000 | ORAL_TABLET | Freq: Every morning | ORAL | Status: DC

## 2012-01-17 NOTE — Progress Notes (Signed)
Subjective:    Patient ID: [name] [name], female    DOB: 03/13/1951, 61 y.o.   MRN: [id number]  HPI [name] [name] is back regarding her chronic cervical and low back pain. She had the CT Myelogram per Dr. Jeral Fruit after her last visit with me which showed mild to moderate facet disease above the level of her fusion. Osteophytes and bulgin disc material at C4-5 and movement of a screw towards the spinal canal at C6 on the left, but does not appear to cause any compression or compromise. Her lumbar exam was most noticeable for severe arachnoiditis. Most of her neck pain is axial, but she complains of signficant headaches as well. Lumbar related pain is predominatly in the lower extremities.  At her last visit with me, we increased her opana er which she feels has helped. She doesn't feel that the oxycodone is effective any more for breathrough pain. We titrated lyrica up also, but this was ineffective, and there was a cost issue also. My PA stopped this a few weeks back. She is trying to stay somewhat active but is ultimately limited by pain. She does find pool therapy quite soothing and beneficial. She has some difficulty sleeping at night still due to her pain.  Mood has been generally stable.  Pain Inventory Average Pain 7 Pain Right Now 9 My pain is sharp, burning, stabbing, tingling and aching  In the last 24 hours, has pain interfered with the following? General activity 7 Relation with others 4 Enjoyment of life 10 What TIME of day is your pain at its worst? all the time Sleep (in general) Fair  Pain is worse with: walking, sitting, standing and some activites Pain improves with: heat/ice and medication Relief from Meds: 6  Mobility use a cane how many minutes can you walk? 10-15 ability to climb steps?  yes do you drive?  yes Do you have any goals in this area?  no  Function not employed: date last employed  I need assistance with the following:  household duties and shopping Do  you have any goals in this area?  no  Neuro/Psych weakness numbness tingling trouble walking spasms anxiety  Prior Studies Any changes since last visit?  no  Physicians involved in your care Any changes since last visit?  no   Family History  Problem Relation Age of Onset  . Hypertension Mother   . Heart disease Mother   . Diabetes Mother   . Cancer Father   . Diabetes Brother   . Mental retardation Maternal Grandfather    History   Social History  . Marital Status: Legally Separated    Spouse Name: N/A    Number of Children: N/A  . Years of Education: N/A   Social History Main Topics  . Smoking status: Never Smoker   . Smokeless tobacco: Never Used  . Alcohol Use: No  . Drug Use: No  . Sexually Active: None   Other Topics Concern  . None   Social History Narrative   Has one childDisabled   Past Surgical History  Procedure Date  . Lumbar fusion 11-30-10    L4 - 5  . Exploration of incision for csf leak DEC 2011  X2    POST LAMINECOTMY  . Lumbar laminectomy DEC 2011    L4 - 5  . Lumbar fusion 2000    L2 - 4  . Cervical disc surgery 2005    C5 - 6  . Anterior fusion cervical spine 2007  C5 -6  . Tendon repair JAN 2010    LEFT INDEX AND LONG FINGERS  . Left wrist tenosynectomy w/ left thumb joint repair 02-24-10  . Abdominal hysterectomy 1987    W/ BSO  . Cholecystectomy 1994  . Knee arthroscopy LEFT X3 (LAST ONE 2005)  . Carpal tunnel release RIGHT - 2001  & DEC 2011 W/ BACK SURG.  . Knee arthroscopy 05/17/2011    Procedure: ARTHROSCOPY KNEE;  Surgeon: Curlene Labrum III;  Location: [facility];  Service: Orthopedics;  Laterality: Right;  WITH MEDIAL MENISECTOMY AND removal of suprapatella fat lump   Past Medical History  Diagnosis Date  . Osteoporosis   . Varicosities     venous  . Cardiomyopathy HX --06/2010    EF was 25% during acute illness (PHELONEPHRITIS) Repeat echo 12-06-10 60% showed normal EF.   Marland Kitchen Hypertension     . Headache   . PONV (postoperative nausea and vomiting)   . Heart murmur     DENIES S & S   (ECHO JUN'12 W/ CHART)  . History of chronic bronchitis   . Chronic back pain greater than 3 months duration     S/P BACK SURG'S  . Arachnoiditis BILATERAL LEGS    DUE TO MULTIPLE BACK SURG.'S  . Weakness of both legs DUE TO ARACHNOIDITIS    OCCASIONAL USES CANE  . Blood transfusion   . GERD (gastroesophageal reflux disease) AND HIATIAL HERNIA    CONTROLLED W/ NEXIUM   BP 137/73  Pulse 80  Resp 14  Ht 5\' 5"  (1.651 m)  Wt 164 lb (74.39 kg)  BMI 27.29 kg/m2     Review of Systems  Constitutional: Positive for unexpected weight change.  HENT: Positive for neck pain.   Musculoskeletal: Positive for myalgias, arthralgias and gait problem.  Neurological: Positive for weakness and numbness.  Psychiatric/Behavioral: The patient is nervous/anxious.   All other systems reviewed and are negative.       Objective:   Physical Exam Constitutional: She is oriented to person, place, and time. She appears well-developed and well-nourished.  HENT:  Head: Normocephalic and atraumatic.  Eyes: Conjunctivae and EOM are normal. Pupils are equal, round, and reactive to light.  Neck: Normal range of motion. Neck supple.  Cardiovascular: Normal rate and regular rhythm.  Pulmonary/Chest: Effort normal and breath sounds normal.  Abdominal: Soft. Bowel sounds are normal. She exhibits no distension.  Musculoskeletal:  Cervical back: She exhibits decreased range of motion and tenderness.  Lumbar back: She exhibits decreased range of motion, tenderness, bony tenderness and spasm. She can bend at the waist to approximately 45 degrees before she begins to hurt. Neurological: She is alert and oriented to person, place, and time. No cranial nerve deficit.  She has some subjective weakness in the upper extremities but this is really mild. Strength grossly 4/5. No focal sensory findings are seen in the arms.  Spurling's test is equivocal. Lower extremities do show some mild sensory loss at 1-1+ out of 2. Again lower extremities show no gross weakness. Reflexes are generally 1+ throughout.  Psychiatric: She has a normal mood and affect. Her behavior is normal. Judgment and thought content normal.    Assessment & Plan:   ASSESSMENT:  1. 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 She has documented C6 radiculitis in the past. I still don't see any definitive signs of acute radiculopathy. Pain seems to be worse, however, since her fall. Dr. Jeral Fruit has scheduled a myelogram  2. Post lumbar post laminectomy syndrome.  3. 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  4. Anxiety/depression secondary to the above   PLAN:  1. Her insurance will cover gracilis. We will titrate up to 1200 mg ultimately qday 2. Spinal stimulator is still in the discussion for the arachnoiditis.  3. Continue opana er  30mg  q12 #60  4. Will change her oxycodone to opana IR 10mg  as she's had good efficacy with the opana ER. 5. Based on her CT findings and symptoms, MBB at C2-C3 and C3-4 may be appropriate. I offered this to the patient who like to think about it. Encouraged her to exercise as tolerated. Increased ROM in her lumbar spine and LE's is crucial. She needs to practice good posture in the shoulder girdle and cervical spine as well. 6. Added pamelor qhs for sleep and neuropathic pain 7. Robaxin has been stopped.  8. I'll have the patient see my PA back in about a month

## 2012-01-17 NOTE — Addendum Note (Signed)
Addended by: Faith Rogue T on: 01/17/2012 12:41 PM   Modules accepted: Orders

## 2012-01-17 NOTE — Patient Instructions (Signed)
Work on regular posture and range of motion exercises daily

## 2012-01-19 ENCOUNTER — Ambulatory Visit: Payer: Medicare Other | Admitting: Physical Medicine & Rehabilitation

## 2012-01-25 DIAGNOSIS — R5381 Other malaise: Secondary | ICD-10-CM | POA: Diagnosis not present

## 2012-01-25 DIAGNOSIS — E119 Type 2 diabetes mellitus without complications: Secondary | ICD-10-CM | POA: Diagnosis not present

## 2012-01-25 DIAGNOSIS — R5383 Other fatigue: Secondary | ICD-10-CM | POA: Diagnosis not present

## 2012-01-31 DIAGNOSIS — R5381 Other malaise: Secondary | ICD-10-CM | POA: Diagnosis not present

## 2012-02-15 ENCOUNTER — Encounter: Payer: Self-pay | Admitting: *Deleted

## 2012-02-21 ENCOUNTER — Encounter: Payer: Medicare Other | Attending: Physical Medicine & Rehabilitation | Admitting: Physical Medicine & Rehabilitation

## 2012-02-21 ENCOUNTER — Encounter: Payer: Self-pay | Admitting: Physical Medicine & Rehabilitation

## 2012-02-21 VITALS — BP 149/55 | HR 57 | Ht 65.0 in | Wt 158.6 lb

## 2012-02-21 DIAGNOSIS — F341 Dysthymic disorder: Secondary | ICD-10-CM

## 2012-02-21 DIAGNOSIS — M5412 Radiculopathy, cervical region: Secondary | ICD-10-CM | POA: Diagnosis not present

## 2012-02-21 DIAGNOSIS — F411 Generalized anxiety disorder: Secondary | ICD-10-CM

## 2012-02-21 DIAGNOSIS — F419 Anxiety disorder, unspecified: Secondary | ICD-10-CM

## 2012-02-21 DIAGNOSIS — M961 Postlaminectomy syndrome, not elsewhere classified: Secondary | ICD-10-CM | POA: Diagnosis not present

## 2012-02-21 DIAGNOSIS — G039 Meningitis, unspecified: Secondary | ICD-10-CM | POA: Diagnosis not present

## 2012-02-21 DIAGNOSIS — F418 Other specified anxiety disorders: Secondary | ICD-10-CM

## 2012-02-21 MED ORDER — GABAPENTIN 600 MG PO TABS: 600.0000 mg | ORAL_TABLET | Freq: Three times a day (TID) | ORAL | Status: DC

## 2012-02-21 MED ORDER — OXYCODONE HCL 15 MG PO TABS: 15.0000 mg | ORAL_TABLET | Freq: Three times a day (TID) | ORAL | Status: DC | PRN

## 2012-02-21 MED ORDER — OXYMORPHONE HCL ER 40 MG PO TB12: 40.0000 mg | ORAL_TABLET | Freq: Two times a day (BID) | ORAL | Status: DC

## 2012-02-21 NOTE — Patient Instructions (Signed)
Call me with questions 

## 2012-02-21 NOTE — Progress Notes (Signed)
Subjective:    Patient ID: [name] [name], female    DOB: March 02, 1951, 61 y.o.   MRN: [id number]  HPI  [name] [name] is back regarding her chronic neck and lumbar spine mediated pain. The biggest problem over the last month has been her legs and low back. She feels the gralise has been helpful as a whole but it did not help her a week or so ago when her legs really flared up.   She remains on her opana er but doesn't feel that it last the 12 hours. She wonders if an increase in her oxycodone would be helpful.   She is trying to stay positive as a whole. She knows that she will have pain and tries to remain somewhat active and interactive with family and community. Sleep remains a battle. She usually gets up to 5 hours per night.    Pain Inventory Average Pain 6 Pain Right Now 8 My pain is constant, sharp, burning, tingling and aching  In the last 24 hours, has pain interfered with the following? General activity 7 Relation with others 7 Enjoyment of life 7 What TIME of day is your pain at its worst? all of the time Sleep (in general) Poor  Pain is worse with: walking, bending, sitting, standing and some activites Pain improves with: heat/ice and medication Relief from Meds: 4  Mobility use a cane how many minutes can you walk? 5-10 ability to climb steps?  yes do you drive?  yes  Function disabled: date disabled 2000  Neuro/Psych weakness numbness tingling trouble walking spasms anxiety  Prior Studies Any changes since last visit?  no  Physicians involved in your care Any changes since last visit?  no   Family History  Problem Relation Age of Onset  . Hypertension Mother   . Heart disease Mother   . Diabetes Mother   . Cancer Father   . Diabetes Brother   . Mental retardation Maternal Grandfather    History   Social History  . Marital Status: Legally Separated    Spouse Name: N/A    Number of Children: N/A  . Years of Education: N/A   Occupational  History  . disable    Social History Main Topics  . Smoking status: Never Smoker   . Smokeless tobacco: Never Used  . Alcohol Use: No  . Drug Use: No  . Sexually Active: None   Other Topics Concern  . None   Social History Narrative   Has one childDisabled   Past Surgical History  Procedure Date  . Lumbar fusion 11-30-10    L4 - 5  . Exploration of incision for csf leak DEC 2011  X2    POST LAMINECOTMY  . Lumbar laminectomy DEC 2011    L4 - 5  . Lumbar fusion 2000    L2 - 4  . Cervical disc surgery 2005    C5 - 6  . Anterior fusion cervical spine 2007    C5 -6  . Tendon repair JAN 2010    LEFT INDEX AND LONG FINGERS  . Left wrist tenosynectomy w/ left thumb joint repair 02-24-10  . Abdominal hysterectomy 1987    W/ BSO  . Cholecystectomy 1994  . Knee arthroscopy LEFT X3 (LAST ONE 2005)  . Carpal tunnel release RIGHT - 2001  & DEC 2011 W/ BACK SURG.  . Knee arthroscopy 05/17/2011    Procedure: ARTHROSCOPY KNEE;  Surgeon: Carlisle Beers Rendall III;  Location: [facility];  Service: Orthopedics;  Laterality: Right;  WITH MEDIAL MENISECTOMY AND removal of suprapatella fat lump   Past Medical History  Diagnosis Date  . Osteoporosis   . Varicosities     venous  . Cardiomyopathy HX --06/2010    EF was 25% during acute illness (PHELONEPHRITIS) Repeat echo 12-06-10 60% showed normal EF.   Marland Kitchen Hypertension   . Headache   . PONV (postoperative nausea and vomiting)   . Heart murmur     DENIES S & S   (ECHO JUN'12 W/ CHART)  . History of chronic bronchitis   . Chronic back pain greater than 3 months duration     S/P BACK SURG'S  . Arachnoiditis BILATERAL LEGS    DUE TO MULTIPLE BACK SURG.'S  . Weakness of both legs DUE TO ARACHNOIDITIS    OCCASIONAL USES CANE  . Blood transfusion   . GERD (gastroesophageal reflux disease) AND HIATIAL HERNIA    CONTROLLED W/ NEXIUM  . Acute sinusitis, unspecified   . Anxiety state, unspecified   . Essential hypertension,  benign   . Dyslipidemia   . Other malaise and fatigue    BP 149/55  Pulse 57  Ht 5\' 5"  (1.651 m)  Wt 158 lb 9.6 oz (71.94 kg)  BMI 26.39 kg/m2  SpO2 98%    Review of Systems  Constitutional: Positive for appetite change and unexpected weight change.  Gastrointestinal: Positive for abdominal pain.  Musculoskeletal: Positive for gait problem.       Spasms  Neurological: Positive for weakness and numbness.       Tingling  Psychiatric/Behavioral: The patient is nervous/anxious.   All other systems reviewed and are negative.       Objective:   Physical Exam Constitutional: She is oriented to person, place, and time. She appears well-developed and well-nourished.  HENT:  Head: Normocephalic and atraumatic.  Eyes: Conjunctivae and EOM are normal. Pupils are equal, round, and reactive to light.  Neck: Normal range of motion. Neck supple.  Cardiovascular: Normal rate and regular rhythm.  Pulmonary/Chest: Effort normal and breath sounds normal.  Abdominal: Soft. Bowel sounds are normal. She exhibits no distension.  Musculoskeletal:  Cervical back: She exhibits decreased range of motion and tenderness.  Lumbar back: She exhibits decreased range of motion, tenderness, bony tenderness and spasm. She can bend at the waist to approximately 45 degrees before she begins to hurt. Neurological: She is alert and oriented to person, place, and time. No cranial nerve deficit.  She has some subjective weakness in the upper extremities but this is really mild. Strength grossly 4/5. No consistent or focal sensory findings are seen in the arms. Spurling's test is equivocal today. Lower extremities continues to show some mild sensory loss at 1-1+ out of 2. Again lower extremities show no gross weakness. Reflexes are generally 1+ throughout.  Psychiatric: She has a normal mood and affect. Her behavior is normal. Judgment and thought content normal.  Assessment & Plan:   ASSESSMENT:  1. Chronic  cervicalgia post laminectomy syndrome with likely chronic  radiculitis. She has documented C6 radiculitis in the past. I still don't see any definitive signs of acute radiculopathy.  2. Post lumbar post laminectomy syndrome.  3. 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  4. Anxiety/depression secondary to the above   PLAN:  1. Will further titrate neurontin to 1800 - 2400 mg ultimately daily. It doesn't appear that insurance coverered gralise.   2. Spinal  stimulator is still in the discussion for the arachnoiditis.  3. Increase Opana ER to 40mg  q12 #60  4. Will continue oxy IR for breakthrough. I explained to the patient why we shouldn't be increasing the dose, especially considering a rebound pain phenomenon.  5. Based on her CT findings and symptoms, MBB at C2-C3 and C3-4 may be appropriate. I offered this to the patient who like to think about it. Encouraged her to exercise as tolerated. Increased ROM in her lumbar spine and LE's is crucial. She needs to practice good posture in the shoulder girdle and cervical spine as well.  6. Will hold off on pamelor at this point as she didn't feel it helped sleep or pain. 7. Robaxin has been stopped.  8. The patient will see me back in about one months. All questions were encouraged and answered.

## 2012-03-07 ENCOUNTER — Encounter: Payer: Self-pay | Admitting: Cardiology

## 2012-03-07 ENCOUNTER — Ambulatory Visit (INDEPENDENT_AMBULATORY_CARE_PROVIDER_SITE_OTHER): Payer: Medicare Other | Admitting: Cardiology

## 2012-03-07 VITALS — BP 141/73 | HR 59 | Ht 65.0 in | Wt 155.8 lb

## 2012-03-07 DIAGNOSIS — I10 Essential (primary) hypertension: Secondary | ICD-10-CM

## 2012-03-07 DIAGNOSIS — R0602 Shortness of breath: Secondary | ICD-10-CM | POA: Diagnosis not present

## 2012-03-07 DIAGNOSIS — R0600 Dyspnea, unspecified: Secondary | ICD-10-CM

## 2012-03-07 DIAGNOSIS — R0989 Other specified symptoms and signs involving the circulatory and respiratory systems: Secondary | ICD-10-CM

## 2012-03-07 DIAGNOSIS — R0609 Other forms of dyspnea: Secondary | ICD-10-CM | POA: Diagnosis not present

## 2012-03-07 LAB — BRAIN NATRIURETIC PEPTIDE: Pro B Natriuretic peptide (BNP): 54 pg/mL (ref 0.0–100.0)

## 2012-03-07 NOTE — Progress Notes (Signed)
HPI The patient presents for evaluation of dyspnea. She saw Dr. Kirke Corin previously for this. In the past she's had a reduced ejection fraction felt to be related to pyelonephritis and sepsis.  However, on followup echocardiography last year her EF was normalized. She was complaining of dyspnea last year when she was seen she was going to have a stress test. However, her back was so bad that time she didn't think she could lie flat for this. Therefore, she did not have it.  She now presents for followup of her dyspnea. She says she gets short of breath walking 10 yards on level ground. She doesn't describe PND or orthopnea. She does describe sometimes breathlessness even talking on the phone. She says it takes a minute or so to recover. She thinks it is slowly progressive. She does not describe chest pressure, neck or arm discomfort. He does not describe palpitations, presyncope or syncope. She's not describing PND or orthopnea. She's had no weight gain or edema. She is limited her activities with her back pain.  Allergies  Allergen Reactions  . Latex Itching and Rash  . Morphine And Related Hives and Itching  . Aspirin Nausea And Vomiting    Can take coated asa  . Clarithromycin Diarrhea  . Codeine Itching  . Darvocet (Propoxyphene-Acetaminophen) Itching  . Lortab (Hydrocodone-Acetaminophen) Itching  . Percocet (Oxycodone-Acetaminophen) Itching    Current Outpatient Prescriptions  Medication Sig Dispense Refill  . albuterol (PROVENTIL HFA;VENTOLIN HFA) 108 (90 BASE) MCG/ACT inhaler Inhale 2 puffs into the lungs every 4 (four) hours as needed. For shortness of breath      . aspirin 81 MG tablet Take 81 mg by mouth as directed. COATED ASA every other day      . bisoprolol-hydrochlorothiazide (ZIAC) 5-6.25 MG per tablet Take 1 tablet by mouth daily.       . Calcium Carbonate-Vit D-Min 1200-1000 MG-UNIT CHEW Chew by mouth 2 (two) times daily.       . cyclobenzaprine (FLEXERIL) 10 MG tablet Take  10 mg by mouth 2 (two) times daily as needed. For muscle spams      . diazepam (VALIUM) 10 MG tablet Take 10 mg by mouth every 6 (six) hours as needed. MUSCLE SPASMS , ALTERNATES W/ FLEXERIL      . esomeprazole (NEXIUM) 40 MG capsule Take 40 mg by mouth every morning.       . estrogens, conjugated, (PREMARIN) 1.25 MG tablet Take 1.25 mg by mouth every morning.       . gabapentin (NEURONTIN) 600 MG tablet Take 600 mg by mouth 2 (two) times daily. Take 2 daily      . ibandronate (BONIVA) 150 MG tablet Take 150 mg by mouth every 30 (thirty) days. Take in the morning with a full glass of water, on an empty stomach, and do not take anything else by mouth or lie down for the next 30 min.      . levalbuterol (XOPENEX HFA) 45 MCG/ACT inhaler Inhale 1-2 puffs into the lungs every 4 (four) hours as needed.      Marland Kitchen oxyCODONE (ROXICODONE) 15 MG immediate release tablet Take 1 tablet (15 mg total) by mouth every 8 (eight) hours as needed.  90 tablet  0  . oxymorphone (OPANA ER) 40 MG 12 hr tablet Take 1 tablet (40 mg total) by mouth every 12 (twelve) hours.  60 tablet  0  . vitamin B-12 (CYANOCOBALAMIN) 1000 MCG tablet Take 1,000 mcg by mouth daily.       Marland Kitchen  vitamin E 100 UNIT capsule Take 100 Units by mouth daily.      Marland Kitchen DISCONTD: gabapentin (NEURONTIN) 600 MG tablet Take 1 tablet (600 mg total) by mouth 4 (four) times daily - after meals and at bedtime. Take 3x per day for the first week then increase to 4x per day.  120 tablet  4    Past Medical History  Diagnosis Date  . Osteoporosis   . Varicosities     venous  . Cardiomyopathy HX --06/2010    EF was 25% during acute illness (PHELONEPHRITIS) Repeat echo 12-06-10 60% showed normal EF.   Marland Kitchen Hypertension   . Headache   . PONV (postoperative nausea and vomiting)   . Heart murmur     DENIES S & S   (ECHO JUN'12 W/ CHART)  . History of chronic bronchitis   . Chronic back pain greater than 3 months duration     S/P BACK SURG'S  . Arachnoiditis BILATERAL  LEGS    DUE TO MULTIPLE BACK SURG.'S  . Weakness of both legs DUE TO ARACHNOIDITIS    OCCASIONAL USES CANE  . Blood transfusion   . GERD (gastroesophageal reflux disease) AND HIATIAL HERNIA    CONTROLLED W/ NEXIUM  . Acute sinusitis, unspecified   . Anxiety state, unspecified   . Essential hypertension, benign   . Dyslipidemia   . Other malaise and fatigue     Past Surgical History  Procedure Date  . Lumbar fusion 11-30-10    L4 - 5  . Exploration of incision for csf leak DEC 2011  X2    POST LAMINECOTMY  . Lumbar laminectomy DEC 2011    L4 - 5  . Lumbar fusion 2000    L2 - 4  . Cervical disc surgery 2005    C5 - 6  . Anterior fusion cervical spine 2007    C5 -6  . Tendon repair JAN 2010    LEFT INDEX AND LONG FINGERS  . Left wrist tenosynectomy w/ left thumb joint repair 02-24-10  . Abdominal hysterectomy 1987    W/ BSO  . Cholecystectomy 1994  . Knee arthroscopy LEFT X3 (LAST ONE 2005)  . Carpal tunnel release RIGHT - 2001  & DEC 2011 W/ BACK SURG.  . Knee arthroscopy 05/17/2011    Procedure: ARTHROSCOPY KNEE;  Surgeon: Curlene Labrum III;  Location: [facility];  Service: Orthopedics;  Laterality: Right;  WITH MEDIAL MENISECTOMY AND removal of suprapatella fat lump    ROS:  As stated in the HPI and negative for all other systems.  PHYSICAL EXAM BP 141/73  Pulse 59  Ht 5\' 5"  (1.651 m)  Wt 155 lb 12.8 oz (70.67 kg)  BMI 25.93 kg/m2 GENERAL:  Well appearing HEENT:  Pupils equal round and reactive, fundi not visualized, oral mucosa unremarkable NECK:  No jugular venous distention, waveform within normal limits, carotid upstroke brisk and symmetric, no bruits, no thyromegaly LYMPHATICS:  No cervical, inguinal adenopathy LUNGS:  Clear to auscultation bilaterally BACK:  No CVA tenderness CHEST:  Unremarkable HEART:  PMI not displaced or sustained,S1 and S2 within normal limits, no S3, no S4, no clicks, no rubs, no murmurs ABD:  Flat, positive bowel  sounds normal in frequency in pitch, no bruits, no rebound, no guarding, no midline pulsatile mass, no hepatomegaly, no splenomegaly EXT:  2 plus pulses throughout, no edema, no cyanosis no clubbing SKIN:  No rashes no nodules NEURO:  Cranial nerves II through XII grossly intact, motor  grossly intact throughout [facility]  Cognitively intact, oriented to person place and time  EKG:  Sinus rhythm, rate 59, axis within normal limits, intervals within normal limits, no acute ST-T wave changes. 03/07/2012  .  ASSESSMENT AND PLAN  Dyspnea -  This could be an anginal equivalent. She needs screening with a stress test. However, she cannot walk because of her back pain. Therefore, she will have a [employer].  I will also check a BNP.  Cardiomyopathy -  She did have a reduced ejection fraction documented at the time of hospitalization for pyelonephritis. However, I reviewed the results of an echo June of last year at which point her ejection fraction had normalized. Of particular concern to her because of her mother's history of mitral valve regurgitation she has no evidence of prolapse or regurgitation. No further imaging is indicated for this.  Hypertension -  Her blood pressure is well controlled. Continue current treatment.

## 2012-03-07 NOTE — Patient Instructions (Addendum)
The current medical regimen is effective;  continue present plan and medications.  Please have blood work today  (BNP)  Your physician has requested that you have a lexiscan myoview. For further information please visit [email or web address] Please follow instruction sheet, as given.  Follow up after testing has been completed.

## 2012-03-22 ENCOUNTER — Encounter: Payer: Medicare Other | Attending: Physical Medicine & Rehabilitation | Admitting: Physical Medicine & Rehabilitation

## 2012-03-22 ENCOUNTER — Encounter: Payer: Self-pay | Admitting: Physical Medicine & Rehabilitation

## 2012-03-22 VITALS — BP 142/83 | HR 74 | Resp 16 | Ht 65.0 in | Wt 157.0 lb

## 2012-03-22 DIAGNOSIS — F411 Generalized anxiety disorder: Secondary | ICD-10-CM | POA: Diagnosis not present

## 2012-03-22 DIAGNOSIS — F419 Anxiety disorder, unspecified: Secondary | ICD-10-CM

## 2012-03-22 DIAGNOSIS — M961 Postlaminectomy syndrome, not elsewhere classified: Secondary | ICD-10-CM | POA: Diagnosis not present

## 2012-03-22 DIAGNOSIS — G039 Meningitis, unspecified: Secondary | ICD-10-CM | POA: Diagnosis not present

## 2012-03-22 DIAGNOSIS — F341 Dysthymic disorder: Secondary | ICD-10-CM | POA: Diagnosis not present

## 2012-03-22 DIAGNOSIS — F418 Other specified anxiety disorders: Secondary | ICD-10-CM

## 2012-03-22 DIAGNOSIS — M5412 Radiculopathy, cervical region: Secondary | ICD-10-CM | POA: Insufficient documentation

## 2012-03-22 MED ORDER — OXYMORPHONE HCL ER 40 MG PO TB12: 40.0000 mg | ORAL_TABLET | Freq: Two times a day (BID) | ORAL | Status: DC

## 2012-03-22 MED ORDER — OXYCODONE HCL 15 MG PO TABS: 15.0000 mg | ORAL_TABLET | Freq: Three times a day (TID) | ORAL | Status: DC | PRN

## 2012-03-22 NOTE — Patient Instructions (Signed)
TAKE 300MG  OF GABAPENTIN AT BREAKFAST AND AGAIN DURING THE DAY (IF TOLERATED). TAKE 900MG  OF GABAPENTIN AT BEDTIME--INCREASE THIS TO 1200MG  IF YOU ARE STILL UNABLE TO SLEEP BUT TOLERATING THE MEDICATION OTHERWISE.

## 2012-03-22 NOTE — Addendum Note (Signed)
Addended by: Caryl Ada on: 03/22/2012 03:16 PM   Modules accepted: Orders

## 2012-03-22 NOTE — Progress Notes (Signed)
Subjective:    Patient ID: [name] [name], female    DOB: 1950-07-07, 61 y.o.   MRN: [id number]  HPI  Aerilynn is back regarding her multiple pain complaints. She couldn't tolerate more than 3 of the neurontin per day as it made her feel groggy. She feels that the opana increase was beneficial.   She has had a respiratory infection recently which seems to have set her back a bit over the last few weeks. She has had more "aches" as a result. She was coughing a lot which seemed to exacerbate her neck symptoms.    Pain Inventory Average Pain 7 Pain Right Now 8 My pain is sharp, burning, tingling and aching  In the last 24 hours, has pain interfered with the following? General activity 8 Relation with others 7 Enjoyment of life 9 What TIME of day is your pain at its worst? All Day Sleep (in general) Poor  Pain is worse with: walking, sitting, standing and some activites Pain improves with: medication Relief from Meds: 5  Mobility use a cane ability to climb steps?  yes do you drive?  yes  Function Do you have any goals in this area?  no  Neuro/Psych weakness numbness tingling trouble walking spasms  Prior Studies Any changes since last visit?  no  Physicians involved in your care Any changes since last visit?  no   Family History  Problem Relation Age of Onset  . Hypertension Mother   . Heart disease Mother   . Diabetes Mother   . Cancer Father   . Diabetes Brother   . Mental retardation Maternal Grandfather    History   Social History  . Marital Status: Legally Separated    Spouse Name: N/A    Number of Children: N/A  . Years of Education: N/A   Occupational History  . disable    Social History Main Topics  . Smoking status: Never Smoker   . Smokeless tobacco: Never Used  . Alcohol Use: No  . Drug Use: No  . Sexually Active: None   Other Topics Concern  . None   Social History Narrative   Has one childDisabled   Past Surgical History    Procedure Date  . Lumbar fusion 11-30-10    L4 - 5  . Exploration of incision for csf leak DEC 2011  X2    POST LAMINECOTMY  . Lumbar laminectomy DEC 2011    L4 - 5  . Lumbar fusion 2000    L2 - 4  . Cervical disc surgery 2005    C5 - 6  . Anterior fusion cervical spine 2007    C5 -6  . Tendon repair JAN 2010    LEFT INDEX AND LONG FINGERS  . Left wrist tenosynectomy w/ left thumb joint repair 02-24-10  . Abdominal hysterectomy 1987    W/ BSO  . Cholecystectomy 1994  . Knee arthroscopy LEFT X3 (LAST ONE 2005)  . Carpal tunnel release RIGHT - 2001  & DEC 2011 W/ BACK SURG.  . Knee arthroscopy 05/17/2011    Procedure: ARTHROSCOPY KNEE;  Surgeon: Curlene Labrum III;  Location: [facility];  Service: Orthopedics;  Laterality: Right;  WITH MEDIAL MENISECTOMY AND removal of suprapatella fat lump   Past Medical History  Diagnosis Date  . Osteoporosis   . Varicosities     venous  . Cardiomyopathy HX --06/2010    EF was 25% during acute illness (PHELONEPHRITIS) Repeat echo 12-06-10 60% showed normal  EF.   . Hypertension   . Headache   . PONV (postoperative nausea and vomiting)   . Heart murmur     DENIES S & S   (ECHO JUN'12 W/ CHART)  . History of chronic bronchitis   . Chronic back pain greater than 3 months duration     S/P BACK SURG'S  . Arachnoiditis BILATERAL LEGS    DUE TO MULTIPLE BACK SURG.'S  . Weakness of both legs DUE TO ARACHNOIDITIS    OCCASIONAL USES CANE  . Blood transfusion   . GERD (gastroesophageal reflux disease) AND HIATIAL HERNIA    CONTROLLED W/ NEXIUM  . Acute sinusitis, unspecified   . Anxiety state, unspecified   . Essential hypertension, benign   . Dyslipidemia   . Other malaise and fatigue    BP 142/83  Pulse 74  Resp 16  Ht 5\' 5"  (1.651 m)  Wt 157 lb (71.215 kg)  BMI 26.13 kg/m2  SpO2 100%      Review of Systems  HENT: Positive for neck pain and neck stiffness.   Respiratory: Negative.   Cardiovascular: Negative.    Gastrointestinal: Negative.   Genitourinary: Negative.   Musculoskeletal: Positive for myalgias, back pain and gait problem.  Skin: Negative.   Neurological: Positive for weakness and numbness.  Hematological: Negative.   Psychiatric/Behavioral: Negative.        Objective:   Physical Exam  Constitutional: She is oriented to person, place, and time. She appears well-developed and well-nourished.  HENT:  Head: Normocephalic and atraumatic.  Eyes: Conjunctivae and EOM are normal. Pupils are equal, round, and reactive to light.  Neck: Normal range of motion. Neck supple.  Cardiovascular: Normal rate and regular rhythm.  Pulmonary/Chest: Effort normal and breath sounds normal.  Abdominal: Soft. Bowel sounds are normal. She exhibits no distension.  Musculoskeletal:  Cervical back: She exhibits decreased range of motion and tenderness.  Lumbar back: She exhibits decreased range of motion, tenderness, bony tenderness and spasm. She can bend at the waist to approximately 45 degrees before she begins to hurt. Neurological: She is alert and oriented to person, place, and time. No cranial nerve deficit.  She has some subjective weakness in the upper extremities but this is really mild. Strength grossly 4/5. No consistent or focal sensory findings are seen in the arms. Spurling's test is equivocal today. Lower extremities continues to show some mild sensory loss at 1-1+ out of 2. Again lower extremities show no gross weakness. Reflexes are generally 1+ throughout. Gait is normal Psychiatric: She has a normal mood and affect. Her behavior is normal. Judgment and thought content normal.  Assessment & Plan:   ASSESSMENT:  1. 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 She has documented C6 radiculitis in the past. I still don't see any definitive signs of acute radiculopathy.  2. Post lumbar post laminectomy syndrome.  3. 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  4. Anxiety/depression secondary to the above    PLAN:  1. Discussed adjusting neurontin to take 900 mg qhs and 300mg  at breakfast and mid day. We can try to titrate further if she tolerates.   2. Spinal stimulator is still in the discussion for the arachnoiditis.  3. Continue with Opana ER to 40mg  q12 #60  4. Will continue oxy IR for breakthrough.  5. Based on her CT findings and symptoms, MBB at C2-C3 and C3-4 may be appropriate. She is  not anxious to pursue these. Continue to focus on appropriate posture and exercise as tolerated 6. Consider resuming pamelor at a later time to help with sleep and pain.  7. Flexeril and valium prn for spasms 8. The patient will see my PA in one month.. All questions were encouraged and answered.

## 2012-03-25 ENCOUNTER — Encounter (HOSPITAL_COMMUNITY): Payer: Medicare Other

## 2012-03-28 ENCOUNTER — Ambulatory Visit: Payer: Medicare Other | Admitting: Cardiology

## 2012-04-01 ENCOUNTER — Ambulatory Visit (HOSPITAL_COMMUNITY): Payer: Medicare Other | Attending: Cardiovascular Disease | Admitting: Radiology

## 2012-04-01 VITALS — BP 127/83 | Ht 65.0 in | Wt 157.0 lb

## 2012-04-01 DIAGNOSIS — R0789 Other chest pain: Secondary | ICD-10-CM | POA: Diagnosis not present

## 2012-04-01 DIAGNOSIS — R002 Palpitations: Secondary | ICD-10-CM | POA: Diagnosis not present

## 2012-04-01 DIAGNOSIS — R079 Chest pain, unspecified: Secondary | ICD-10-CM

## 2012-04-01 DIAGNOSIS — I10 Essential (primary) hypertension: Secondary | ICD-10-CM | POA: Diagnosis not present

## 2012-04-01 DIAGNOSIS — R0989 Other specified symptoms and signs involving the circulatory and respiratory systems: Secondary | ICD-10-CM | POA: Insufficient documentation

## 2012-04-01 DIAGNOSIS — I428 Other cardiomyopathies: Secondary | ICD-10-CM | POA: Insufficient documentation

## 2012-04-01 DIAGNOSIS — R0609 Other forms of dyspnea: Secondary | ICD-10-CM | POA: Insufficient documentation

## 2012-04-01 DIAGNOSIS — R000 Tachycardia, unspecified: Secondary | ICD-10-CM | POA: Diagnosis not present

## 2012-04-01 DIAGNOSIS — R0602 Shortness of breath: Secondary | ICD-10-CM | POA: Diagnosis not present

## 2012-04-01 MED ORDER — TECHNETIUM TC 99M SESTAMIBI GENERIC - CARDIOLITE
33.0000 | Freq: Once | INTRAVENOUS | Status: AC | PRN
  Administered 2012-04-01: 33 via INTRAVENOUS

## 2012-04-01 MED ORDER — TECHNETIUM TC 99M SESTAMIBI GENERIC - CARDIOLITE
11.0000 | Freq: Once | INTRAVENOUS | Status: AC | PRN
  Administered 2012-04-01: 11 via INTRAVENOUS

## 2012-04-01 MED ORDER — REGADENOSON 0.4 MG/5ML IV SOLN
0.4000 mg | Freq: Once | INTRAVENOUS | Status: AC
  Administered 2012-04-01: 0.4 mg via INTRAVENOUS

## 2012-04-01 NOTE — Progress Notes (Signed)
[facility] SITE 3 NUCLEAR MED [address] [id number] [place] [address] [phone]  Cardiology Nuclear Med Study  [name] [name] is a 61 y.o. female     MRN : [id number]     DOB: 02-07-1951  Procedure Date: 04/01/2012  Nuclear Med Background Indication for Stress Test:  Evaluation for Ischemia History:  Cardiomyopathy Cardiac Risk Factors: Hypertension and Lipids  Symptoms:  Chest Pressure/Tightness  (last date of chest discomfort yesterday), DOE, Fatigue, Nausea, Palpitations, Rapid HR and SOB   Nuclear Pre-Procedure Caffeine/Decaff Intake:  None > 12 hrs NPO After: 10:00pm   Lungs:  clear O2 Sat: 98% on room air. IV 0.9% NS with Angio Cath:  22g  IV Site: L Antecubital x 1, tolerated well IV Started by:  Irean Hong, RN  Chest Size (in):  38 Cup Size: D  Height: 5\' 5"  (1.651 m)  Weight:  157 lb (71.215 kg)  BMI:  Body mass index is 26.13 kg/(m^2). Tech Comments: Last dose Ziac 24 hrs ago    Nuclear Med Study 1 or 2 day study: 1 day  Stress Test Type:  Lexiscan  Reading MD: Dietrich Pates, MD  Order Authorizing Provider:  Rollene Rotunda, MD  Resting Radionuclide: Technetium 58m Sestamibi  Resting Radionuclide Dose: 10.9 mCi   Stress Radionuclide:  Technetium 65m Sestamibi  Stress Radionuclide Dose: 22.0 mCi           Stress Protocol Rest HR: 62 Stress HR: 99  Rest BP: 127/83 Stress BP: 137/64  Exercise Time (min): n/a METS: n/a   Predicted Max HR: 161 bpm % Max HR: 61.49 bpm Rate Pressure Product: 10272   Dose of Adenosine (mg):  n/a Dose of Lexiscan: 0.4 mg  Dose of Atropine (mg): n/a Dose of Dobutamine: n/a mcg/kg/min (at max HR)  Stress Test Technologist: Cathlyn Parsons, RN  Nuclear Technologist:  Domenic Polite, CNMT     Rest Procedure:  Myocardial perfusion imaging was performed at rest 45 minutes following the intravenous administration of Technetium 67m Sestamibi. Rest ECG: Sinus Rhythm  Stress Procedure:  The patient  received IV Lexiscan 0.4 mg over 15-seconds.  Technetium 68m Sestamibi injected at 30-seconds. Patient had chest pressure 5-6/10,dizziness with infusion. Relieved in recovery. There were no significant changes with Lexiscan.  Quantitative spect images were obtained after a 45 minute delay. Stress ECG: No significant change from baseline ECG  QPS Raw Data Images:  Soft tissue (diaphragm, bowel activity) underlie heart. Stress Images:  Normal homogeneous uptake in all areas of the myocardium. Rest Images:  Normal homogeneous uptake in all areas of the myocardium. Subtraction (SDS):  No evidence of ischemia. Transient Ischemic Dilatation (Normal <1.22):  1.02 Lung/Heart Ratio (Normal <0.45):  0.33  Quantitative Gated Spect Images QGS EDV:  85 ml QGS ESV:  30 ml  Impression Exercise Capacity:  Lexiscan with no exercise. BP Response:  Normal blood pressure response. Clinical Symptoms:  Significant chest pain. ECG Impression:  No significant ST segment change suggestive of ischemia. Comparison with Prior Nuclear Study:  No previous scan  Overall Impression:  Normal stress nuclear study.  LV Ejection Fraction: 65%.  LV Wall Motion:  NL LV Function; NL Wall Motion  Dietrich Pates

## 2012-04-04 ENCOUNTER — Encounter: Payer: Self-pay | Admitting: Cardiology

## 2012-04-04 ENCOUNTER — Ambulatory Visit (INDEPENDENT_AMBULATORY_CARE_PROVIDER_SITE_OTHER): Payer: Medicare Other | Admitting: Cardiology

## 2012-04-04 VITALS — BP 138/84 | HR 93 | Ht 65.0 in | Wt 160.6 lb

## 2012-04-04 DIAGNOSIS — R0609 Other forms of dyspnea: Secondary | ICD-10-CM

## 2012-04-04 DIAGNOSIS — I10 Essential (primary) hypertension: Secondary | ICD-10-CM | POA: Diagnosis not present

## 2012-04-04 DIAGNOSIS — R0600 Dyspnea, unspecified: Secondary | ICD-10-CM

## 2012-04-04 DIAGNOSIS — R0989 Other specified symptoms and signs involving the circulatory and respiratory systems: Secondary | ICD-10-CM | POA: Diagnosis not present

## 2012-04-04 NOTE — Progress Notes (Signed)
HPI The patient presents for evaluation of dyspnea. She saw Dr. Kirke Corin previously for this. In the past she's had a reduced ejection fraction felt to be related to pyelonephritis and sepsis.  However, on followup echocardiography last year her EF was normalized. She was complaining of dyspnea last year when she was seen she was going to have a stress test. However, her back was so bad that time she didn't think she could lie flat for this. Therefore, she did not have it.  She saw me recently for followup on this dyspnea.  I sent her for a stress perfusion study and was happy to see that there was no evidence of ischemia with a well preserved ejection fraction. Also a BNP level was normal. The patient continues to describe the same shortness of breath walking 10 yards on level ground and breathlessness with talking. She still does not report weight gain or edema. She does not describe PND or orthopnea. She has no chest discomfort.   Allergies  Allergen Reactions  . Latex Itching and Rash  . Morphine And Related Hives and Itching  . Aspirin Nausea And Vomiting    Can take coated asa  . Clarithromycin Diarrhea  . Codeine Itching  . Darvocet (Propoxyphene-Acetaminophen) Itching  . Lortab (Hydrocodone-Acetaminophen) Itching  . Percocet (Oxycodone-Acetaminophen) Itching    Current Outpatient Prescriptions  Medication Sig Dispense Refill  . albuterol (PROVENTIL HFA;VENTOLIN HFA) 108 (90 BASE) MCG/ACT inhaler Inhale 2 puffs into the lungs every 4 (four) hours as needed. For shortness of breath      . aspirin 81 MG tablet Take 81 mg by mouth as directed. COATED ASA every other day      . bisoprolol-hydrochlorothiazide (ZIAC) 5-6.25 MG per tablet Take 1 tablet by mouth daily.       . Calcium Carbonate-Vit D-Min 1200-1000 MG-UNIT CHEW Chew by mouth 2 (two) times daily.       . cyclobenzaprine (FLEXERIL) 10 MG tablet Take 10 mg by mouth 2 (two) times daily as needed. For muscle spams      . diazepam  (VALIUM) 10 MG tablet Take 10 mg by mouth every 6 (six) hours as needed. MUSCLE SPASMS , ALTERNATES W/ FLEXERIL      . esomeprazole (NEXIUM) 40 MG capsule Take 40 mg by mouth every morning.       . estrogens, conjugated, (PREMARIN) 1.25 MG tablet Take 1.25 mg by mouth every morning.       . gabapentin (NEURONTIN) 600 MG tablet Take 600 mg by mouth 2 (two) times daily. Take 2 daily      . ibandronate (BONIVA) 150 MG tablet Take 150 mg by mouth every 30 (thirty) days. Take in the morning with a full glass of water, on an empty stomach, and do not take anything else by mouth or lie down for the next 30 min.      . levalbuterol (XOPENEX HFA) 45 MCG/ACT inhaler Inhale 1-2 puffs into the lungs every 4 (four) hours as needed.      Marland Kitchen oxyCODONE (ROXICODONE) 15 MG immediate release tablet Take 1 tablet (15 mg total) by mouth every 8 (eight) hours as needed.  90 tablet  0  . oxymorphone (OPANA ER) 40 MG 12 hr tablet Take 1 tablet (40 mg total) by mouth every 12 (twelve) hours.  60 tablet  0  . vitamin B-12 (CYANOCOBALAMIN) 1000 MCG tablet Take 1,000 mcg by mouth daily.       . vitamin E 100 UNIT capsule  Take 100 Units by mouth daily.        Past Medical History  Diagnosis Date  . Osteoporosis   . Varicosities     venous  . Cardiomyopathy HX --06/2010    EF was 25% during acute illness (PHELONEPHRITIS) Repeat echo 12-06-10 60% showed normal EF.   Marland Kitchen Hypertension   . Headache   . PONV (postoperative nausea and vomiting)   . Heart murmur     DENIES S & S   (ECHO JUN'12 W/ CHART)  . History of chronic bronchitis   . Chronic back pain greater than 3 months duration     S/P BACK SURG'S  . Arachnoiditis BILATERAL LEGS    DUE TO MULTIPLE BACK SURG.'S  . Weakness of both legs DUE TO ARACHNOIDITIS    OCCASIONAL USES CANE  . Blood transfusion   . GERD (gastroesophageal reflux disease) AND HIATIAL HERNIA    CONTROLLED W/ NEXIUM  . Acute sinusitis, unspecified   . Anxiety state, unspecified   . Essential  hypertension, benign   . Dyslipidemia   . Other malaise and fatigue     Past Surgical History  Procedure Date  . Lumbar fusion 11-30-10    L4 - 5  . Exploration of incision for csf leak DEC 2011  X2    POST LAMINECOTMY  . Lumbar laminectomy DEC 2011    L4 - 5  . Lumbar fusion 2000    L2 - 4  . Cervical disc surgery 2005    C5 - 6  . Anterior fusion cervical spine 2007    C5 -6  . Tendon repair JAN 2010    LEFT INDEX AND LONG FINGERS  . Left wrist tenosynectomy w/ left thumb joint repair 02-24-10  . Abdominal hysterectomy 1987    W/ BSO  . Cholecystectomy 1994  . Knee arthroscopy LEFT X3 (LAST ONE 2005)  . Carpal tunnel release RIGHT - 2001  & DEC 2011 W/ BACK SURG.  . Knee arthroscopy 05/17/2011    Procedure: ARTHROSCOPY KNEE;  Surgeon: Curlene Labrum III;  Location: [facility];  Service: Orthopedics;  Laterality: Right;  WITH MEDIAL MENISECTOMY AND removal of suprapatella fat lump    ROS:  As stated in the HPI and negative for all other systems.  PHYSICAL EXAM BP 138/84  Pulse 93  Ht 5\' 5"  (1.651 m)  Wt 160 lb 9.6 oz (72.848 kg)  BMI 26.73 kg/m2 GENERAL:  Well appearing NECK:  No jugular venous distention, waveform within normal limits, carotid upstroke brisk and symmetric, no bruits, no thyromegaly LUNGS:  Clear to auscultation bilaterally BACK:  No CVA tenderness CHEST:  Unremarkable HEART:  PMI not displaced or sustained,S1 and S2 within normal limits, no S3, no S4, no clicks, no rubs, no murmurs ABD:  Flat, positive bowel sounds normal in frequency in pitch, no bruits, no rebound, no guarding, no midline pulsatile mass, no hepatomegaly, no splenomegaly EXT:  2 plus pulses throughout, no edema, no cyanosis no clubbing  ASSESSMENT AND PLAN  Dyspnea -  There is no evidence for a cardiac etiology.  I will schedule a new patient appt with one of our pulmonologists..    Cardiomyopathy -  She did have a reduced ejection fraction documented at the  time of hospitalization for pyelonephritis. However, I reviewed the results of an echo June of last year at which point her ejection fraction had normalized.  Her EF was confirmed to be normal and a stress test. No further evaluation  is indicated.   Hypertension -  Her blood pressure is well controlled. Continue current treatment.

## 2012-04-04 NOTE — Patient Instructions (Addendum)
The current medical regimen is effective;  continue present plan and medications.  You want been referred to pulmonary for the evaluation of shortness of breath.  Follow up with Dr Antoine Poche as needed.

## 2012-04-22 ENCOUNTER — Encounter: Payer: Self-pay | Admitting: Internal Medicine

## 2012-04-22 ENCOUNTER — Encounter: Payer: Self-pay | Admitting: Physical Medicine and Rehabilitation

## 2012-04-22 ENCOUNTER — Encounter
Payer: Medicare Other | Attending: Physical Medicine and Rehabilitation | Admitting: Physical Medicine and Rehabilitation

## 2012-04-22 ENCOUNTER — Ambulatory Visit (INDEPENDENT_AMBULATORY_CARE_PROVIDER_SITE_OTHER): Payer: Medicare Other | Admitting: Internal Medicine

## 2012-04-22 VITALS — BP 163/80 | HR 127 | Resp 16 | Ht 65.0 in | Wt 161.0 lb

## 2012-04-22 VITALS — BP 136/90 | HR 83 | Temp 97.7°F | Ht 65.0 in | Wt 161.2 lb

## 2012-04-22 DIAGNOSIS — R209 Unspecified disturbances of skin sensation: Secondary | ICD-10-CM | POA: Insufficient documentation

## 2012-04-22 DIAGNOSIS — R51 Headache: Secondary | ICD-10-CM | POA: Diagnosis not present

## 2012-04-22 DIAGNOSIS — G8929 Other chronic pain: Secondary | ICD-10-CM | POA: Diagnosis not present

## 2012-04-22 DIAGNOSIS — R0600 Dyspnea, unspecified: Secondary | ICD-10-CM

## 2012-04-22 DIAGNOSIS — R0609 Other forms of dyspnea: Secondary | ICD-10-CM

## 2012-04-22 DIAGNOSIS — G039 Meningitis, unspecified: Secondary | ICD-10-CM | POA: Insufficient documentation

## 2012-04-22 DIAGNOSIS — F329 Major depressive disorder, single episode, unspecified: Secondary | ICD-10-CM | POA: Insufficient documentation

## 2012-04-22 DIAGNOSIS — F411 Generalized anxiety disorder: Secondary | ICD-10-CM | POA: Diagnosis not present

## 2012-04-22 DIAGNOSIS — M5412 Radiculopathy, cervical region: Secondary | ICD-10-CM | POA: Diagnosis not present

## 2012-04-22 DIAGNOSIS — M79609 Pain in unspecified limb: Secondary | ICD-10-CM | POA: Diagnosis not present

## 2012-04-22 DIAGNOSIS — F341 Dysthymic disorder: Secondary | ICD-10-CM

## 2012-04-22 DIAGNOSIS — F3289 Other specified depressive episodes: Secondary | ICD-10-CM | POA: Insufficient documentation

## 2012-04-22 DIAGNOSIS — M961 Postlaminectomy syndrome, not elsewhere classified: Secondary | ICD-10-CM | POA: Insufficient documentation

## 2012-04-22 DIAGNOSIS — F419 Anxiety disorder, unspecified: Secondary | ICD-10-CM

## 2012-04-22 DIAGNOSIS — F418 Other specified anxiety disorders: Secondary | ICD-10-CM

## 2012-04-22 MED ORDER — OXYCODONE HCL 15 MG PO TABS: 15.0000 mg | ORAL_TABLET | Freq: Three times a day (TID) | ORAL | Status: DC | PRN

## 2012-04-22 MED ORDER — OXYMORPHONE HCL ER 40 MG PO TB12: 40.0000 mg | ORAL_TABLET | Freq: Two times a day (BID) | ORAL | Status: DC

## 2012-04-22 MED ORDER — GABAPENTIN 600 MG PO TABS: 600.0000 mg | ORAL_TABLET | Freq: Three times a day (TID) | ORAL | Status: DC

## 2012-04-22 NOTE — Progress Notes (Signed)
Subjective:    Patient ID: [name] [name], female    DOB: 1951-03-27, 61 y.o.   MRN: [id number]  HPI  PCP is HASANAJ,XAJE A, MD Cards is dr Daiva Nakayama  Body mass index is 26.83 kg/(m^2). Passive smoker x 17 years   IOV 04/22/2012 61 year old female. Referred for dyspnea. Wondering if she has passive smoking related lung disease (husband and son)  Dyspnea: In 2011 December was critically ill following spine surgery, had UTI and low EF but since then has recovered from critical illness. Says she has been dyspneic even before this critical illness for total 3-4 years but since past 1 year more noticeable dyspnea. Sometimes dyspneic walking from room to room. Sometimes dyspneic talking on phone. Triggers include cig smoke  Of husband ("husband's cig smoke is killing me and they think I am crazy") and is associated with cough at that time. Dyspnea is slowly progressive. Severity is mild-moderate. No orthopnea. No paroxysmal nocturnal dyspnea but has woken up wheezing at night since sept 2013 (was exposed to perfume at doctor office and dx acute bronchitis and given abx). THere is associated cough but only episodic.  There is no edema, hemoptysis, unintenional weight loss.   Walking desaturation test on 04/23/2012 185 feet x 3 laps:  did NOT desaturate. Rest pulse ox was 97%, final pulse ox was 94%. HR response 84/min at rest to 86/min at peak exertion.   RElevant hx   Fused L2-L5 x 3 spine surgery due to disc disease, osteoporosis, and also fall, various reasons  LABS - cxr sept 2012: clear lung fields - hgb nov 2012: 11.7gm%  - cardiac 04/01/12: Normal stress test with normal EF - no hx of PFTs     Past Medical History  Diagnosis Date  . Osteoporosis   . Varicosities     venous  . Cardiomyopathy HX --06/2010    EF was 25% during acute illness (PHELONEPHRITIS) Repeat echo 12-06-10 60% showed normal EF.   Marland Kitchen Hypertension   . Headache   . PONV (postoperative nausea and vomiting)   .  Heart murmur     DENIES S & S   (ECHO JUN'12 W/ CHART)  . History of chronic bronchitis   . Chronic back pain greater than 3 months duration     S/P BACK SURG'S  . Arachnoiditis BILATERAL LEGS    DUE TO MULTIPLE BACK SURG.'S  . Weakness of both legs DUE TO ARACHNOIDITIS    OCCASIONAL USES CANE  . Blood transfusion   . GERD (gastroesophageal reflux disease) AND HIATIAL HERNIA    CONTROLLED W/ NEXIUM  . Acute sinusitis, unspecified   . Anxiety state, unspecified   . Essential hypertension, benign   . Dyslipidemia   . Other malaise and fatigue      Family History  Problem Relation Age of Onset  . Hypertension Mother   . Heart disease Mother   . Diabetes Mother   . Cancer Father   . Diabetes Brother      History   Social History  . Marital Status: Legally Separated    Spouse Name: N/A    Number of Children: N/A  . Years of Education: N/A   Occupational History  . disable    Social History Main Topics  . Smoking status: Never Smoker   . Smokeless tobacco: Never Used  . Alcohol Use: No  . Drug Use: No  . Sexually Active: Not on file   Other Topics Concern  . Not on  file   Social History Narrative   Has one childDisabled     Allergies  Allergen Reactions  . Latex Itching and Rash  . Morphine And Related Hives and Itching  . Aspirin Nausea And Vomiting    Can take coated asa  . Clarithromycin Diarrhea  . Codeine Itching  . Darvocet (Propoxyphene-Acetaminophen) Itching  . Lortab (Hydrocodone-Acetaminophen) Itching  . Percocet (Oxycodone-Acetaminophen) Itching     Outpatient Prescriptions Prior to Visit  Medication Sig Dispense Refill  . albuterol (PROVENTIL HFA;VENTOLIN HFA) 108 (90 BASE) MCG/ACT inhaler Inhale 2 puffs into the lungs every 4 (four) hours as needed. For shortness of breath      . aspirin 81 MG tablet Take 81 mg by mouth as directed. COATED ASA every other day      . bisoprolol-hydrochlorothiazide (ZIAC) 5-6.25 MG per tablet Take 1 tablet by  mouth daily.       . Calcium Carbonate-Vit D-Min 1200-1000 MG-UNIT CHEW Chew by mouth 2 (two) times daily.       . cyclobenzaprine (FLEXERIL) 10 MG tablet Take 10 mg by mouth 2 (two) times daily as needed. For muscle spams      . diazepam (VALIUM) 10 MG tablet Take 10 mg by mouth every 6 (six) hours as needed. MUSCLE SPASMS , ALTERNATES W/ FLEXERIL      . esomeprazole (NEXIUM) 40 MG capsule Take 40 mg by mouth every morning.       . estrogens, conjugated, (PREMARIN) 1.25 MG tablet Take 1.25 mg by mouth every morning.       . gabapentin (NEURONTIN) 600 MG tablet Take 1 tablet (600 mg total) by mouth 3 (three) times daily.  90 tablet  2  . ibandronate (BONIVA) 150 MG tablet Take 150 mg by mouth every 30 (thirty) days. Take in the morning with a full glass of water, on an empty stomach, and do not take anything else by mouth or lie down for the next 30 min.      . levalbuterol (XOPENEX HFA) 45 MCG/ACT inhaler Inhale 1-2 puffs into the lungs every 4 (four) hours as needed.      Marland Kitchen oxyCODONE (ROXICODONE) 15 MG immediate release tablet Take 1 tablet (15 mg total) by mouth every 8 (eight) hours as needed.  90 tablet  0  . oxymorphone (OPANA ER) 40 MG 12 hr tablet Take 1 tablet (40 mg total) by mouth every 12 (twelve) hours.  60 tablet  0  . vitamin B-12 (CYANOCOBALAMIN) 1000 MCG tablet Take 1,000 mcg by mouth daily.       . [DISCONTINUED] cefUROXime (CEFTIN) 250 MG tablet       . [DISCONTINUED] promethazine (PHENERGAN) 25 MG tablet       . [DISCONTINUED] vitamin E 100 UNIT capsule Take 100 Units by mouth daily.       Last reviewed on 04/22/2012  3:25 PM by Darrell Jewel, CMA    Review of Systems  Constitutional: Positive for unexpected weight change. Negative for fever.  HENT: Positive for congestion, sore throat, rhinorrhea and sneezing. Negative for ear pain, nosebleeds, trouble swallowing, dental problem, postnasal drip and sinus pressure.   Eyes: Negative for redness and itching.    Respiratory: Positive for cough and shortness of breath. Negative for chest tightness and wheezing.   Cardiovascular: Negative for palpitations and leg swelling.  Gastrointestinal: Negative for nausea and vomiting.  Genitourinary: Negative for dysuria.  Musculoskeletal: Positive for joint swelling.  Skin: Negative for rash.  Neurological: Negative for headaches.  Hematological: Does not bruise/bleed easily.  Psychiatric/Behavioral: Negative for dysphoric mood. The patient is nervous/anxious.        Objective:   Physical Exam  Vitals reviewed. Constitutional: She is oriented to person, place, and time. She appears well-developed and well-nourished. No distress.  HENT:  Head: Normocephalic and atraumatic.  Right Ear: External ear normal.  Left Ear: External ear normal.  Mouth/Throat: Oropharynx is clear and moist. No oropharyngeal exudate.  Eyes: Conjunctivae normal and EOM are normal. Pupils are equal, round, and reactive to light. Right eye exhibits no discharge. Left eye exhibits no discharge. No scleral icterus.  Neck: Normal range of motion. Neck supple. No JVD present. No tracheal deviation present. No thyromegaly present.  Cardiovascular: Normal rate, regular rhythm, normal heart sounds and intact distal pulses.  Exam reveals no gallop and no friction rub.   No murmur heard. Pulmonary/Chest: Effort normal. No respiratory distress. She has wheezes. She has no rales. She exhibits no tenderness.       Faint exp wheeze anteriorly upepr lobe  Abdominal: Soft. Bowel sounds are normal. She exhibits no distension and no mass. There is no tenderness. There is no rebound and no guarding.  Musculoskeletal: Normal range of motion. She exhibits no edema and no tenderness.  Lymphadenopathy:    She has no cervical adenopathy.  Neurological: She is alert and oriented to person, place, and time. She has normal reflexes. No cranial nerve deficit. She exhibits normal muscle tone. Coordination  normal.  Skin: Skin is warm and dry. No rash noted. She is not diaphoretic. No erythema. No pallor.  Psychiatric: She has a normal mood and affect. Her behavior is normal. Judgment and thought content normal.          Assessment & Plan:

## 2012-04-22 NOTE — Patient Instructions (Signed)
Continue with walking and staying active as tolerated, advised patient to try to do some aquatic exercises in the pool of the [facility]

## 2012-04-22 NOTE — Patient Instructions (Addendum)
#  Shortness of breath  - our check list says latex allergy is cautionary for flu shot; do not know why but we will defer flu shot today  - CMA/RN will walk you for oxygen levels now 04/22/2012  - please have full PFT breathing test at [place] or [place] or [place] at your convenience; soon after doing it call us at [phone] and pass message on so I can review and advise next step

## 2012-04-22 NOTE — Progress Notes (Signed)
Subjective:    Patient ID: CARRIEANN SPIELBERG, female    DOB: 07/26/1950, 61 y.o.   MRN: [id number]  HPI The patient complains about chronic neck pain which radiates into back of her head and into her posterior right arm. The patient also complains about intermittend numbness and tingling in the posterior right arm, right hand and some of her fingertips, she can not really say which ones. The problem has been gotten worse, but she does not want to see her neurosurgeon yet, she states, that she will wait for another month, to see whether her problem improves. She also told me that she has taken one of her son's Amitriptyline, to help her to go to sleep, because she had severe problems to fall asleep. Pain Inventory Average Pain 9 Pain Right Now 9 My pain is constant, sharp, burning, tingling and aching  In the last 24 hours, has pain interfered with the following? General activity 10 Relation with others 9 Enjoyment of life 10 What TIME of day is your pain at its worst? varies Sleep (in general) Poor  Pain is worse with: walking, sitting, standing and some activites Pain improves with: heat/ice and medication Relief from Meds: 6  Mobility use a cane how many minutes can you walk? 5-10 ability to climb steps?  yes do you drive?  yes  Function not employed: date last employed 11/00 disabled: date disabled 10/1999  Neuro/Psych weakness numbness tingling trouble walking spasms anxiety  Prior Studies Any changes since last visit?  no  Physicians involved in your care Any changes since last visit?  no   Family History  Problem Relation Age of Onset  . Hypertension Mother   . Heart disease Mother   . Diabetes Mother   . Cancer Father   . Diabetes Brother    History   Social History  . Marital Status: Legally Separated    Spouse Name: N/A    Number of Children: N/A  . Years of Education: N/A   Occupational History  . disable    Social History Main Topics  .  Smoking status: Never Smoker   . Smokeless tobacco: Never Used  . Alcohol Use: No  . Drug Use: No  . Sexually Active: None   Other Topics Concern  . None   Social History Narrative   Has one childDisabled   Past Surgical History  Procedure Date  . Lumbar fusion 11-30-10    L4 - 5  . Exploration of incision for csf leak DEC 2011  X2    POST LAMINECOTMY  . Lumbar laminectomy DEC 2011    L4 - 5  . Lumbar fusion 2000    L2 - 4  . Cervical disc surgery 2005    C5 - 6  . Anterior fusion cervical spine 2007    C5 -6  . Tendon repair JAN 2010    LEFT INDEX AND LONG FINGERS  . Left wrist tenosynectomy w/ left thumb joint repair 02-24-10  . Abdominal hysterectomy 1987    W/ BSO  . Cholecystectomy 1994  . Knee arthroscopy LEFT X3 (LAST ONE 2005)  . Carpal tunnel release RIGHT - 2001  & DEC 2011 W/ BACK SURG.  . Knee arthroscopy 05/17/2011    Procedure: ARTHROSCOPY KNEE;  Surgeon: Curlene Labrum III;  Location: [facility];  Service: Orthopedics;  Laterality: Right;  WITH MEDIAL MENISECTOMY AND removal of suprapatella fat lump   Past Medical History  Diagnosis Date  . Osteoporosis   .  Varicosities     venous  . Cardiomyopathy HX --06/2010    EF was 25% during acute illness (PHELONEPHRITIS) Repeat echo 12-06-10 60% showed normal EF.   Marland Kitchen Hypertension   . Headache   . PONV (postoperative nausea and vomiting)   . Heart murmur     DENIES S & S   (ECHO JUN'12 W/ CHART)  . History of chronic bronchitis   . Chronic back pain greater than 3 months duration     S/P BACK SURG'S  . Arachnoiditis BILATERAL LEGS    DUE TO MULTIPLE BACK SURG.'S  . Weakness of both legs DUE TO ARACHNOIDITIS    OCCASIONAL USES CANE  . Blood transfusion   . GERD (gastroesophageal reflux disease) AND HIATIAL HERNIA    CONTROLLED W/ NEXIUM  . Acute sinusitis, unspecified   . Anxiety state, unspecified   . Essential hypertension, benign   . Dyslipidemia   . Other malaise and fatigue     BP 163/80  Pulse 127  Resp 16  Ht 5\' 5"  (1.651 m)  Wt 161 lb (73.029 kg)  BMI 26.79 kg/m2  SpO2 94%     Review of Systems  Musculoskeletal: Positive for myalgias, back pain and arthralgias.  Neurological: Positive for weakness and numbness.  Psychiatric/Behavioral: The patient is nervous/anxious.   All other systems reviewed and are negative.       Objective:   Physical Exam  Constitutional: She is oriented to person, place, and time. She appears well-developed and well-nourished.  HENT:  Head: Normocephalic.  Musculoskeletal: She exhibits tenderness.  Neurological: She is alert and oriented to person, place, and time.  Skin: Skin is warm and dry.  Psychiatric: She has a normal mood and affect.    Symmetric normal motor tone is noted throughout. Normal muscle bulk. Muscle testing reveals 5/5 muscle strength of the upper extremity, and 5/5 of the lower extremity, some diffuse give away weakness and some diffuse tremor. Full range of motion in upper and lower extremities. ROM of spine is restricted, C-spine ext 30 degrees, flex 45 degrees, rot 45 degrees each direction. Fine motor movements are normal in both hands. Sensory is intact and symmetric to light touch, pinprick and proprioception. DTR in the upper and lower extremity are present and symmetric 2+. No clonus is noted.  Patient arises from chair without difficulty. Narrow based gait with normal arm swing bilateral , able to walk on heels and toes . Tandem walk is stable. No pronator drift. Rhomberg negative.       Assessment & Plan:  1. 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 She has documented C6 radiculitis in the past. I still don't see any definitive signs of acute radiculopathy.  2. Post lumbar post laminectomy syndrome.  3. 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  4. Anxiety/depression secondary  to the above  PLAN:  1. Discussed adjusting neurontin to take 900 mg qhs and 300mg  at breakfast and mid day. We can try to titrate further if she tolerates. Patient did not tolerate the increased dose well, she states, that she is taking the gabapentin 600mg   tid 2. Spinal stimulator is still in the discussion for the arachnoiditis.  3. Continue with Opana ER to 40mg  q12 #60  4. Will continue oxy IR for breakthrough.  5. Based on her CT findings and symptoms, MBB at C2-C3 and C3-4 may be appropriate. She is not anxious to pursue  these. Continue to focus on appropriate posture and exercise as tolerated  6. Pamelor did not help with sleep and pain, she took one of her son's amitriptyline, which he is taking for depression, therefore higher dose, to help her falling asleep. I educated her, that she can not take medication which is not prescribed to her, I told her that there can be severe interactions, and also that she signed a contract with [place], that she will not take other medication without letting us know. I informed her that I will talk to Dr. Riley Kill, and ask him what he would like to do with this information.  7. Flexeril and valium prn for spasms  8. The patient will see my PA in one month.. All questions were encouraged and answered.

## 2012-04-25 DIAGNOSIS — I10 Essential (primary) hypertension: Secondary | ICD-10-CM | POA: Diagnosis not present

## 2012-05-05 NOTE — Assessment & Plan Note (Signed)
Unclear but asthma could weight  Plan Get full pft If normal, do methacholine challenge test. If this too is negative do CPST   She agrees with plan

## 2012-05-07 DIAGNOSIS — M19049 Primary osteoarthritis, unspecified hand: Secondary | ICD-10-CM | POA: Diagnosis not present

## 2012-05-22 ENCOUNTER — Encounter: Payer: Self-pay | Admitting: Physical Medicine and Rehabilitation

## 2012-05-22 ENCOUNTER — Encounter
Payer: Medicare Other | Attending: Physical Medicine and Rehabilitation | Admitting: Physical Medicine and Rehabilitation

## 2012-05-22 VITALS — BP 160/80 | HR 85 | Resp 14 | Ht 65.0 in | Wt 160.2 lb

## 2012-05-22 DIAGNOSIS — G09 Sequelae of inflammatory diseases of central nervous system: Secondary | ICD-10-CM | POA: Diagnosis not present

## 2012-05-22 DIAGNOSIS — I10 Essential (primary) hypertension: Secondary | ICD-10-CM | POA: Insufficient documentation

## 2012-05-22 DIAGNOSIS — G039 Meningitis, unspecified: Secondary | ICD-10-CM

## 2012-05-22 DIAGNOSIS — K219 Gastro-esophageal reflux disease without esophagitis: Secondary | ICD-10-CM | POA: Diagnosis not present

## 2012-05-22 DIAGNOSIS — F418 Other specified anxiety disorders: Secondary | ICD-10-CM

## 2012-05-22 DIAGNOSIS — F411 Generalized anxiety disorder: Secondary | ICD-10-CM | POA: Insufficient documentation

## 2012-05-22 DIAGNOSIS — M961 Postlaminectomy syndrome, not elsewhere classified: Secondary | ICD-10-CM | POA: Diagnosis not present

## 2012-05-22 DIAGNOSIS — M5412 Radiculopathy, cervical region: Secondary | ICD-10-CM

## 2012-05-22 DIAGNOSIS — M542 Cervicalgia: Secondary | ICD-10-CM

## 2012-05-22 DIAGNOSIS — F341 Dysthymic disorder: Secondary | ICD-10-CM

## 2012-05-22 DIAGNOSIS — E785 Hyperlipidemia, unspecified: Secondary | ICD-10-CM | POA: Insufficient documentation

## 2012-05-22 DIAGNOSIS — F419 Anxiety disorder, unspecified: Secondary | ICD-10-CM

## 2012-05-22 DIAGNOSIS — F329 Major depressive disorder, single episode, unspecified: Secondary | ICD-10-CM | POA: Insufficient documentation

## 2012-05-22 DIAGNOSIS — F3289 Other specified depressive episodes: Secondary | ICD-10-CM | POA: Insufficient documentation

## 2012-05-22 DIAGNOSIS — M545 Low back pain, unspecified: Secondary | ICD-10-CM | POA: Diagnosis not present

## 2012-05-22 MED ORDER — OXYMORPHONE HCL ER 40 MG PO TB12: 40.0000 mg | ORAL_TABLET | Freq: Two times a day (BID) | ORAL | Status: DC

## 2012-05-22 MED ORDER — OXYCODONE HCL 15 MG PO TABS: 15.0000 mg | ORAL_TABLET | Freq: Three times a day (TID) | ORAL | Status: DC | PRN

## 2012-05-22 NOTE — Patient Instructions (Signed)
Apply some heat, also you can wear your soft collar for support.

## 2012-05-22 NOTE — Progress Notes (Signed)
Subjective:    Patient ID: [name] [name], female    DOB: [birth year], 61 y.o.   MRN: [id number]  HPI The patient complains about chronic neck pain which radiates into back of her head and into her posterior right arm. The patient also complains about intermittend numbness and tingling in the posterior right arm, right hand and some of her fingertips, she can not really say which ones.  The problem has been gotten worse, but she does not want to see her neurosurgeon yet. She states, that she now has severe pain when she turns her head to the left, and she also sometimes hears a clicking sound while she is turning her head.  Pain Inventory Average Pain 7 Pain Right Now 8 My pain is sharp, burning, tingling and aching  In the last 24 hours, has pain interfered with the following? General activity 8 Relation with others 9 Enjoyment of life 9 What TIME of day is your pain at its worst? all the time Sleep (in general) Fair  Pain is worse with: walking, sitting and standing Pain improves with: rest and heat/ice Relief from Meds: 6  Mobility walk without assistance use a cane how many minutes can you walk? 5-15 ability to climb steps?  yes do you drive?  yes  Function not employed: date last employed   Neuro/Psych weakness numbness tingling trouble walking spasms anxiety  Prior Studies Any changes since last visit?  no  Physicians involved in your care Any changes since last visit?  no   Family History  Problem Relation Age of Onset  . Hypertension Mother   . Heart disease Mother   . Diabetes Mother   . Cancer Father   . Diabetes Brother    History   Social History  . Marital Status: Legally Separated    Spouse Name: N/A    Number of Children: N/A  . Years of Education: N/A   Occupational History  . disable    Social History Main Topics  . Smoking status: Never Smoker   . Smokeless tobacco: Never Used  . Alcohol Use: No  . Drug Use: No  . Sexually  Active: None   Other Topics Concern  . None   Social History Narrative   Has one childDisabled   Past Surgical History  Procedure Date  . Lumbar fusion 11-30-10    L4 - 5  . Exploration of incision for csf leak DEC 2011  X2    POST LAMINECOTMY  . Lumbar laminectomy DEC 2011    L4 - 5  . Lumbar fusion 2000    L2 - 4  . Cervical disc surgery 2005    C5 - 6  . Anterior fusion cervical spine 2007    C5 -6  . Tendon repair JAN 2010    LEFT INDEX AND LONG FINGERS  . Left wrist tenosynectomy w/ left thumb joint repair 02-24-10  . Abdominal hysterectomy 1987    W/ BSO  . Cholecystectomy 1994  . Knee arthroscopy LEFT X3 (LAST ONE 2005)  . Carpal tunnel release RIGHT - 2001  & DEC 2011 W/ BACK SURG.  . Knee arthroscopy 05/17/2011    Procedure: ARTHROSCOPY KNEE;  Surgeon: Curlene Labrum III;  Location: [facility];  Service: Orthopedics;  Laterality: Right;  WITH MEDIAL MENISECTOMY AND removal of suprapatella fat lump   Past Medical History  Diagnosis Date  . Osteoporosis   . Varicosities     venous  . Cardiomyopathy HX --06/2010  EF was 25% during acute illness (PHELONEPHRITIS) Repeat echo 12-06-10 60% showed normal EF.   Marland Kitchen Hypertension   . Headache   . PONV (postoperative nausea and vomiting)   . Heart murmur     DENIES S & S   (ECHO JUN'12 W/ CHART)  . History of chronic bronchitis   . Chronic back pain greater than 3 months duration     S/P BACK SURG'S  . Arachnoiditis BILATERAL LEGS    DUE TO MULTIPLE BACK SURG.'S  . Weakness of both legs DUE TO ARACHNOIDITIS    OCCASIONAL USES CANE  . Blood transfusion   . GERD (gastroesophageal reflux disease) AND HIATIAL HERNIA    CONTROLLED W/ NEXIUM  . Acute sinusitis, unspecified   . Anxiety state, unspecified   . Essential hypertension, benign   . Dyslipidemia   . Other malaise and fatigue    BP 160/80  Pulse 85  Resp 14  Ht 5\' 5"  (1.651 m)  Wt 160 lb 3.2 oz (72.666 kg)  BMI 26.66 kg/m2  SpO2  97%    Review of Systems  Musculoskeletal: Positive for back pain and gait problem.  Neurological: Positive for weakness and numbness.       Tingling, spasms   Psychiatric/Behavioral: The patient is nervous/anxious.   All other systems reviewed and are negative.       Objective:   Physical Exam Constitutional: She is oriented to person, place, and time. She appears well-developed and well-nourished.  HENT:  Head: Normocephalic.  Musculoskeletal: She exhibits tenderness.  Neurological: She is alert and oriented to person, place, and time.  Skin: Skin is warm and dry.  Psychiatric: She has a normal mood and affect.   Symmetric normal motor tone is noted throughout. Normal muscle bulk. Muscle testing reveals 5/5 muscle strength of the upper extremity, and 5/5 of the lower extremity, some diffuse give away weakness and some diffuse tremor. Full range of motion in upper and lower extremities. ROM of spine is restricted, C-spine ext 30 degrees, flex 45 degrees, rot 45 degrees to the right, 15 degrees to the left, restricted by pain.  Fine motor movements are restricted in both hands, osteoarthritis and hand surgery.    DTR in the upper and lower extremity are present and symmetric 2+. No clonus is noted.  Patient arises from chair with mild difficulty. Narrow based gait with normal arm swing bilateral , able to walk on heels and toes . Tandem walk is stable. No pronator drift. Rhomberg negative.        Assessment & Plan:  1. 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 Increased severe pain and clicking sound with rotation to the left.  2. Post lumbar post laminectomy syndrome.  3. 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  4. Anxiety/depression secondary to the above  PLAN:  1. Discussed adjusting neurontin to take 900 mg qhs and 300mg  at breakfast and mid day. Patient did not  tolerate the increased dose well, she states, that she is taking the gabapentin 600mg  tid  2. Spinal stimulator is still in the discussion for the arachnoiditis.  3. Continue with Opana ER to 40mg  q12 #60  4. Will continue oxy IR for breakthrough.  5. Based on her CT findings and symptoms, MBB at C2-C3 and C3-4 may be appropriate. She is not anxious to pursue these. Continue to focus on appropriate posture and exercise as tolerated  6. Pamelor  did not help with sleep and pain, she took one of her son's amitriptyline, which he is taking for depression, therefore higher dose. 7. Flexeril and valium prn for spasms  8. The patient will see  PA in one month.. All questions were encouraged and answered.  Ordered x-ray of c-spine, to evaluate whether hardware is intact and in place, because of new pain with rotation to the left, and new clicking/popping sound with rotation.

## 2012-05-27 ENCOUNTER — Ambulatory Visit (INDEPENDENT_AMBULATORY_CARE_PROVIDER_SITE_OTHER): Payer: Medicare Other | Admitting: Internal Medicine

## 2012-05-27 ENCOUNTER — Ambulatory Visit (HOSPITAL_COMMUNITY)
Admission: RE | Admit: 2012-05-27 | Discharge: 2012-05-27 | Disposition: A | Discharge status: Home / Self Care | Payer: Medicare Other | Source: Ambulatory Visit | Attending: Physical Medicine and Rehabilitation | Admitting: Physical Medicine and Rehabilitation

## 2012-05-27 DIAGNOSIS — R0609 Other forms of dyspnea: Secondary | ICD-10-CM

## 2012-05-27 DIAGNOSIS — Z981 Arthrodesis status: Secondary | ICD-10-CM | POA: Insufficient documentation

## 2012-05-27 DIAGNOSIS — M542 Cervicalgia: Secondary | ICD-10-CM | POA: Diagnosis not present

## 2012-05-27 DIAGNOSIS — R0989 Other specified symptoms and signs involving the circulatory and respiratory systems: Secondary | ICD-10-CM | POA: Diagnosis not present

## 2012-05-27 DIAGNOSIS — M961 Postlaminectomy syndrome, not elsewhere classified: Secondary | ICD-10-CM

## 2012-05-27 DIAGNOSIS — R0600 Dyspnea, unspecified: Secondary | ICD-10-CM

## 2012-05-27 LAB — PULMONARY FUNCTION TEST

## 2012-05-27 NOTE — Progress Notes (Signed)
PFT done today. 

## 2012-05-28 ENCOUNTER — Telehealth: Payer: Self-pay

## 2012-05-28 NOTE — Telephone Encounter (Signed)
Informed patient of xray results

## 2012-05-28 NOTE — Telephone Encounter (Signed)
Message copied by Judd Gaudier on Tue May 28, 2012  9:03 AM ------      Message from: Su Monks      Created: Mon May 27, 2012  3:45 PM       Please inform patient , X-ray of C-spine, did not show any adverse features. Solid fusion at C5-6, hardware seems to be intact and in place. But she of course, still has the degenerative changes, which were shown in her CT-scan from April 2013.

## 2012-06-06 DIAGNOSIS — M5412 Radiculopathy, cervical region: Secondary | ICD-10-CM | POA: Diagnosis not present

## 2012-06-06 DIAGNOSIS — M542 Cervicalgia: Secondary | ICD-10-CM | POA: Diagnosis not present

## 2012-06-07 ENCOUNTER — Other Ambulatory Visit: Payer: Self-pay | Admitting: Neurosurgery

## 2012-06-07 DIAGNOSIS — M542 Cervicalgia: Secondary | ICD-10-CM

## 2012-06-10 ENCOUNTER — Telehealth: Payer: Self-pay | Admitting: *Deleted

## 2012-06-10 ENCOUNTER — Ambulatory Visit
Admission: RE | Admit: 2012-06-10 | Discharge: 2012-06-10 | Disposition: A | Discharge status: Home / Self Care | Payer: Medicare Other | Source: Ambulatory Visit | Attending: Neurosurgery | Admitting: Neurosurgery

## 2012-06-10 VITALS — BP 132/57 | HR 60

## 2012-06-10 DIAGNOSIS — M961 Postlaminectomy syndrome, not elsewhere classified: Secondary | ICD-10-CM | POA: Diagnosis not present

## 2012-06-10 DIAGNOSIS — M5412 Radiculopathy, cervical region: Secondary | ICD-10-CM | POA: Diagnosis not present

## 2012-06-10 DIAGNOSIS — M542 Cervicalgia: Secondary | ICD-10-CM | POA: Diagnosis not present

## 2012-06-10 MED ORDER — HYDROMORPHONE HCL PF 2 MG/ML IJ SOLN
2.0000 mg | Freq: Once | INTRAMUSCULAR | Status: AC
  Administered 2012-06-10: 2 mg via INTRAMUSCULAR

## 2012-06-10 MED ORDER — IOHEXOL 300 MG/ML  SOLN
10.0000 mL | Freq: Once | INTRAMUSCULAR | Status: AC | PRN
  Administered 2012-06-10: 10 mL via INTRATHECAL

## 2012-06-10 MED ORDER — DIAZEPAM 5 MG PO TABS
10.0000 mg | ORAL_TABLET | Freq: Once | ORAL | Status: AC
  Administered 2012-06-10: 10 mg via ORAL

## 2012-06-10 MED ORDER — ONDANSETRON HCL 4 MG/2ML IJ SOLN
4.0000 mg | Freq: Once | INTRAMUSCULAR | Status: AC
  Administered 2012-06-10: 4 mg via INTRAMUSCULAR

## 2012-06-10 NOTE — Telephone Encounter (Signed)
Patient wanted to call and let Clydie Braun and Dr. Riley Kill know that Dr. Jeral Fruit ordered an MRI on her today. She went to [facility]. They gave her 10mg  of Valium to take before the MRI. Also gave her 2mg  Dilaudid injection. Patient wanted to make sure we were informed.

## 2012-06-11 ENCOUNTER — Encounter: Payer: Self-pay | Admitting: Physical Medicine and Rehabilitation

## 2012-06-11 ENCOUNTER — Other Ambulatory Visit: Payer: Self-pay | Admitting: Physical Medicine and Rehabilitation

## 2012-06-11 DIAGNOSIS — G039 Meningitis, unspecified: Secondary | ICD-10-CM

## 2012-06-11 DIAGNOSIS — M5412 Radiculopathy, cervical region: Secondary | ICD-10-CM

## 2012-06-11 DIAGNOSIS — M961 Postlaminectomy syndrome, not elsewhere classified: Secondary | ICD-10-CM

## 2012-06-11 DIAGNOSIS — F418 Other specified anxiety disorders: Secondary | ICD-10-CM

## 2012-06-11 DIAGNOSIS — F419 Anxiety disorder, unspecified: Secondary | ICD-10-CM

## 2012-06-13 ENCOUNTER — Telehealth: Payer: Self-pay | Admitting: Internal Medicine

## 2012-06-13 MED ORDER — OXYCODONE HCL 15 MG PO TABS: 15.0000 mg | ORAL_TABLET | Freq: Three times a day (TID) | ORAL | Status: DC | PRN

## 2012-06-13 MED ORDER — OXYMORPHONE HCL ER 40 MG PO TB12: 40.0000 mg | ORAL_TABLET | Freq: Two times a day (BID) | ORAL | Status: DC

## 2012-06-13 NOTE — Telephone Encounter (Signed)
Spoke with patient, patient had PFT done 05/22/12 and is requesting results of this.  Dr. Marchelle Gearing please advise at your earliest convenience thank you!

## 2012-06-14 ENCOUNTER — Telehealth: Payer: Self-pay | Admitting: Internal Medicine

## 2012-06-14 DIAGNOSIS — R0600 Dyspnea, unspecified: Secondary | ICD-10-CM

## 2012-06-14 DIAGNOSIS — R942 Abnormal results of pulmonary function studies: Secondary | ICD-10-CM

## 2012-06-14 NOTE — Telephone Encounter (Signed)
See other note

## 2012-06-14 NOTE — Telephone Encounter (Signed)
PFT 05/27/12 is normal ecwpt mild reduction in diffusion (74%) and 8% BD response. These could be freatures of early emphysema, asthma, or scarring in lungs or normal variant. To better understand this - do HRCT chest. IF this is normal, needs methacholine challenge test for asthma and if that too is normal needs bike pulmonary stress test. She is aware of this algorithm. I will stay in touch by phone. For now, get CT chest (ordered)

## 2012-06-17 NOTE — Telephone Encounter (Signed)
[email or web address].Jennifer Castillo, CMA  

## 2012-06-17 NOTE — Telephone Encounter (Signed)
Pt is aware and CT is scheduled for Monday.Carron Curie, CMA

## 2012-06-21 ENCOUNTER — Other Ambulatory Visit: Payer: Self-pay | Admitting: Neurosurgery

## 2012-06-21 DIAGNOSIS — M5410 Radiculopathy, site unspecified: Secondary | ICD-10-CM

## 2012-06-21 DIAGNOSIS — M542 Cervicalgia: Secondary | ICD-10-CM

## 2012-06-23 DIAGNOSIS — S59919A Unspecified injury of unspecified forearm, initial encounter: Secondary | ICD-10-CM | POA: Diagnosis not present

## 2012-06-23 DIAGNOSIS — IMO0002 Reserved for concepts with insufficient information to code with codable children: Secondary | ICD-10-CM | POA: Diagnosis not present

## 2012-06-23 DIAGNOSIS — S5010XA Contusion of unspecified forearm, initial encounter: Secondary | ICD-10-CM | POA: Diagnosis not present

## 2012-06-23 DIAGNOSIS — Z885 Allergy status to narcotic agent status: Secondary | ICD-10-CM | POA: Diagnosis not present

## 2012-06-23 DIAGNOSIS — I10 Essential (primary) hypertension: Secondary | ICD-10-CM | POA: Diagnosis not present

## 2012-06-23 DIAGNOSIS — M545 Low back pain, unspecified: Secondary | ICD-10-CM | POA: Diagnosis not present

## 2012-06-23 DIAGNOSIS — Z79899 Other long term (current) drug therapy: Secondary | ICD-10-CM | POA: Diagnosis not present

## 2012-06-23 DIAGNOSIS — S59909A Unspecified injury of unspecified elbow, initial encounter: Secondary | ICD-10-CM | POA: Diagnosis not present

## 2012-06-23 DIAGNOSIS — S336XXA Sprain of sacroiliac joint, initial encounter: Secondary | ICD-10-CM | POA: Diagnosis not present

## 2012-06-23 DIAGNOSIS — Z7982 Long term (current) use of aspirin: Secondary | ICD-10-CM | POA: Diagnosis not present

## 2012-06-23 DIAGNOSIS — M25539 Pain in unspecified wrist: Secondary | ICD-10-CM | POA: Diagnosis not present

## 2012-06-24 ENCOUNTER — Other Ambulatory Visit: Payer: Medicare Other

## 2012-06-24 ENCOUNTER — Encounter
Payer: Medicare Other | Attending: Physical Medicine and Rehabilitation | Admitting: Physical Medicine and Rehabilitation

## 2012-06-24 ENCOUNTER — Encounter: Payer: Self-pay | Admitting: Physical Medicine and Rehabilitation

## 2012-06-24 ENCOUNTER — Ambulatory Visit (HOSPITAL_COMMUNITY)
Admission: RE | Admit: 2012-06-24 | Discharge: 2012-06-24 | Disposition: A | Discharge status: Home / Self Care | Payer: Medicare Other | Source: Ambulatory Visit | Attending: Physical Medicine and Rehabilitation | Admitting: Physical Medicine and Rehabilitation

## 2012-06-24 VITALS — BP 152/86 | HR 89 | Resp 14 | Ht 65.0 in | Wt 164.0 lb

## 2012-06-24 DIAGNOSIS — M5412 Radiculopathy, cervical region: Secondary | ICD-10-CM | POA: Insufficient documentation

## 2012-06-24 DIAGNOSIS — S59919A Unspecified injury of unspecified forearm, initial encounter: Secondary | ICD-10-CM | POA: Diagnosis not present

## 2012-06-24 DIAGNOSIS — M4802 Spinal stenosis, cervical region: Secondary | ICD-10-CM | POA: Diagnosis not present

## 2012-06-24 DIAGNOSIS — M961 Postlaminectomy syndrome, not elsewhere classified: Secondary | ICD-10-CM | POA: Diagnosis not present

## 2012-06-24 DIAGNOSIS — F341 Dysthymic disorder: Secondary | ICD-10-CM | POA: Insufficient documentation

## 2012-06-24 DIAGNOSIS — M25529 Pain in unspecified elbow: Secondary | ICD-10-CM | POA: Insufficient documentation

## 2012-06-24 DIAGNOSIS — S59909A Unspecified injury of unspecified elbow, initial encounter: Secondary | ICD-10-CM | POA: Diagnosis not present

## 2012-06-24 DIAGNOSIS — M25429 Effusion, unspecified elbow: Secondary | ICD-10-CM | POA: Diagnosis not present

## 2012-06-24 DIAGNOSIS — M25522 Pain in left elbow: Secondary | ICD-10-CM

## 2012-06-24 NOTE — Progress Notes (Signed)
Subjective:    Patient ID: [name] [name], female    DOB: [birth year], 62 y.o.   MRN: [id number]  HPI The patient complains about chronic neck pain which radiates into back of her head and into her posterior right arm. The patient also complains about intermittend numbness and tingling in the posterior right arm, right hand and some of her fingertips, she can not really say which ones.  The problem has been gotten worse, but she does not want to see her neurosurgeon yet. She states, that she now has severe pain when she turns her head to the left, and she also sometimes hears a clicking sound while she is turning her head. The patient also complains about left elbow pain after a fall on ice on her porch yesterday. She was seen at the ED, but they did not x-rayed her left elbow.  Pain Inventory Average Pain 8 Pain Right Now 9 My pain is constant, sharp, burning, tingling and aching  In the last 24 hours, has pain interfered with the following? General activity 8 Relation with others 8 Enjoyment of life 8 What TIME of day is your pain at its worst? all the time Sleep (in general) Fair  Pain is worse with: walking, bending, sitting, standing and some activites Pain improves with: heat/ice and medication Relief from Meds: 7  Mobility use a cane how many minutes can you walk? 10-15 ability to climb steps?  yes do you drive?  yes  Function disabled: date disabled 04/1999 I need assistance with the following:  household duties and shopping  Neuro/Psych weakness numbness tingling spasms anxiety  Prior Studies x-rays Fell on ice on back porch 06/23/12  Physicians involved in your care Any changes since last visit?  no   Family History  Problem Relation Age of Onset  . Hypertension Mother   . Heart disease Mother   . Diabetes Mother   . Cancer Father   . Diabetes Brother    History   Social History  . Marital Status: Legally Separated    Spouse Name: N/A    Number of  Children: N/A  . Years of Education: N/A   Occupational History  . disable    Social History Main Topics  . Smoking status: Never Smoker   . Smokeless tobacco: Never Used  . Alcohol Use: No  . Drug Use: No  . Sexually Active: None   Other Topics Concern  . None   Social History Narrative   Has one childDisabled   Past Surgical History  Procedure Date  . Lumbar fusion 11-30-10    L4 - 5  . Exploration of incision for csf leak DEC 2011  X2    POST LAMINECOTMY  . Lumbar laminectomy DEC 2011    L4 - 5  . Lumbar fusion 2000    L2 - 4  . Cervical disc surgery 2005    C5 - 6  . Anterior fusion cervical spine 2007    C5 -6  . Tendon repair JAN 2010    LEFT INDEX AND LONG FINGERS  . Left wrist tenosynectomy w/ left thumb joint repair 02-24-10  . Abdominal hysterectomy 1987    W/ BSO  . Cholecystectomy 1994  . Knee arthroscopy LEFT X3 (LAST ONE 2005)  . Carpal tunnel release RIGHT - 2001  & DEC 2011 W/ BACK SURG.  . Knee arthroscopy 05/17/2011    Procedure: ARTHROSCOPY KNEE;  Surgeon: Carlisle Beers Rendall III;  Location: [facility];  Service: Orthopedics;  Laterality: Right;  WITH MEDIAL MENISECTOMY AND removal of suprapatella fat lump   Past Medical History  Diagnosis Date  . Osteoporosis   . Varicosities     venous  . Cardiomyopathy HX --06/2010    EF was 25% during acute illness (PHELONEPHRITIS) Repeat echo 12-06-10 60% showed normal EF.   Marland Kitchen Hypertension   . Headache   . PONV (postoperative nausea and vomiting)   . Heart murmur     DENIES S & S   (ECHO JUN'12 W/ CHART)  . History of chronic bronchitis   . Chronic back pain greater than 3 months duration     S/P BACK SURG'S  . Arachnoiditis BILATERAL LEGS    DUE TO MULTIPLE BACK SURG.'S  . Weakness of both legs DUE TO ARACHNOIDITIS    OCCASIONAL USES CANE  . Blood transfusion   . GERD (gastroesophageal reflux disease) AND HIATIAL HERNIA    CONTROLLED W/ NEXIUM  . Acute sinusitis, unspecified   .  Anxiety state, unspecified   . Essential hypertension, benign   . Dyslipidemia   . Other malaise and fatigue    BP 152/86  Pulse 89  Resp 14  Ht 5\' 5"  (1.651 m)  Wt 164 lb (74.39 kg)  BMI 27.29 kg/m2  SpO2 97%     Review of Systems  Neurological: Positive for weakness and numbness.  Psychiatric/Behavioral: The patient is nervous/anxious.   All other systems reviewed and are negative.       Objective:   Physical Exam Constitutional: She is oriented to person, place, and time. She appears well-developed and well-nourished.  HENT:  Head: Normocephalic.  Musculoskeletal: She exhibits tenderness.  Neurological: She is alert and oriented to person, place, and time.  Skin: Skin is warm and dry.  Psychiatric: She has a normal mood and affect.  Symmetric normal motor tone is noted throughout. Normal muscle bulk. Muscle testing reveals 5/5 muscle strength of the right upper extremity, left not tested today due to pain after a fall, and 5/5 of the lower extremity, some diffuse give away weakness and some diffuse tremor. Full range of motion in upper and lower extremities,except left elbow restricted by pain in flexion and extension by about 10 degrees, and restriction into supination by 5-10 degrees.. ROM of spine is restricted, C-spine ext 30 degrees, flex 45 degrees, rot 45 degrees to the right, 15 degrees to the left, restricted by pain. Fine motor movements are restricted in both hands, osteoarthritis and hand surgery.  DTR in the upper and lower extremity are present and symmetric 2+. No clonus is noted.  Patient arises from chair with mild difficulty. Narrow based gait with normal arm swing bilateral , able to walk on heels and toes . Tandem walk is stable. No pronator drift. Rhomberg negative.        Assessment & Plan:  1. 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 Increased severe pain and clicking sound with rotation to the left.  CT-myelogram from Dec. 2013 showed :  1. 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  2. Stable fusion at C5-6. She is scheduled for a ESI at L4-5 on Jan. the 10th, ordered by Dr. Jeral Fruit 2. Post lumbar post laminectomy syndrome.  3. 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  4. 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 Left elbow pain with restricted ROM, ordered x-ray of  left elbow. PLAN:  1. Discussed adjusting neurontin to take 900 mg qhs and 300mg  at breakfast and mid day. Patient did not tolerate the increased dose well, she states, that she is taking the gabapentin 600mg  tid  2. Spinal stimulator is still in the discussion for the arachnoiditis.  3. Continue with Opana ER to 40mg  q12 #60  4. Will continue oxy IR for breakthrough.   Continue to focus on appropriate posture and exercise as tolerated  6. Pamelor did not help with sleep and pain, she took one of her son's amitriptyline, which he is taking for depression, therefore higher dose.  7. Flexeril and valium prn for spasms  8. The patient will see PA in one month.. All questions were encouraged and answered.  Ordered x-ray of c-spine, to evaluate whether hardware is intact and in place, because of new pain with rotation to the left, and new clicking/popping sound with rotation.

## 2012-06-24 NOTE — Patient Instructions (Signed)
Rest until you recover from your fall.

## 2012-06-28 ENCOUNTER — Ambulatory Visit
Admission: RE | Admit: 2012-06-28 | Discharge: 2012-06-28 | Disposition: A | Discharge status: Home / Self Care | Payer: Medicare Other | Source: Ambulatory Visit | Attending: Neurosurgery | Admitting: Neurosurgery

## 2012-06-28 ENCOUNTER — Encounter: Payer: Self-pay | Admitting: Internal Medicine

## 2012-06-28 VITALS — BP 155/74 | HR 62

## 2012-06-28 DIAGNOSIS — M542 Cervicalgia: Secondary | ICD-10-CM | POA: Diagnosis not present

## 2012-06-28 DIAGNOSIS — S0993XA Unspecified injury of face, initial encounter: Secondary | ICD-10-CM | POA: Diagnosis not present

## 2012-06-28 DIAGNOSIS — M961 Postlaminectomy syndrome, not elsewhere classified: Secondary | ICD-10-CM

## 2012-06-28 DIAGNOSIS — M5410 Radiculopathy, site unspecified: Secondary | ICD-10-CM

## 2012-06-28 DIAGNOSIS — S199XXA Unspecified injury of neck, initial encounter: Secondary | ICD-10-CM | POA: Diagnosis not present

## 2012-06-28 MED ORDER — IOHEXOL 300 MG/ML  SOLN
1.0000 mL | Freq: Once | INTRAMUSCULAR | Status: AC | PRN
  Administered 2012-06-28: 1 mL via EPIDURAL

## 2012-06-28 MED ORDER — TRIAMCINOLONE ACETONIDE 40 MG/ML IJ SUSP (RADIOLOGY)
60.0000 mg | Freq: Once | INTRAMUSCULAR | Status: AC
  Administered 2012-06-28: 60 mg via EPIDURAL

## 2012-07-03 ENCOUNTER — Emergency Department (HOSPITAL_COMMUNITY): Payer: Medicare Other

## 2012-07-03 ENCOUNTER — Encounter (HOSPITAL_COMMUNITY): Payer: Self-pay | Admitting: *Deleted

## 2012-07-03 ENCOUNTER — Emergency Department (HOSPITAL_COMMUNITY)
Admission: EM | Admit: 2012-07-03 | Discharge: 2012-07-03 | Disposition: A | Discharge status: Home / Self Care | Payer: Medicare Other | Attending: Emergency Medicine | Admitting: Emergency Medicine

## 2012-07-03 DIAGNOSIS — I10 Essential (primary) hypertension: Secondary | ICD-10-CM | POA: Insufficient documentation

## 2012-07-03 DIAGNOSIS — K219 Gastro-esophageal reflux disease without esophagitis: Secondary | ICD-10-CM | POA: Insufficient documentation

## 2012-07-03 DIAGNOSIS — Z9889 Other specified postprocedural states: Secondary | ICD-10-CM | POA: Insufficient documentation

## 2012-07-03 DIAGNOSIS — R011 Cardiac murmur, unspecified: Secondary | ICD-10-CM | POA: Insufficient documentation

## 2012-07-03 DIAGNOSIS — E785 Hyperlipidemia, unspecified: Secondary | ICD-10-CM | POA: Insufficient documentation

## 2012-07-03 DIAGNOSIS — G8929 Other chronic pain: Secondary | ICD-10-CM | POA: Insufficient documentation

## 2012-07-03 DIAGNOSIS — S59909A Unspecified injury of unspecified elbow, initial encounter: Secondary | ICD-10-CM | POA: Diagnosis not present

## 2012-07-03 DIAGNOSIS — S239XXA Sprain of unspecified parts of thorax, initial encounter: Secondary | ICD-10-CM | POA: Diagnosis not present

## 2012-07-03 DIAGNOSIS — S335XXA Sprain of ligaments of lumbar spine, initial encounter: Secondary | ICD-10-CM | POA: Insufficient documentation

## 2012-07-03 DIAGNOSIS — S53402A Unspecified sprain of left elbow, initial encounter: Secondary | ICD-10-CM

## 2012-07-03 DIAGNOSIS — Z7982 Long term (current) use of aspirin: Secondary | ICD-10-CM | POA: Insufficient documentation

## 2012-07-03 DIAGNOSIS — M810 Age-related osteoporosis without current pathological fracture: Secondary | ICD-10-CM | POA: Diagnosis not present

## 2012-07-03 DIAGNOSIS — M545 Low back pain: Secondary | ICD-10-CM | POA: Diagnosis not present

## 2012-07-03 DIAGNOSIS — Z8679 Personal history of other diseases of the circulatory system: Secondary | ICD-10-CM | POA: Insufficient documentation

## 2012-07-03 DIAGNOSIS — Z79899 Other long term (current) drug therapy: Secondary | ICD-10-CM | POA: Insufficient documentation

## 2012-07-03 DIAGNOSIS — W010XXA Fall on same level from slipping, tripping and stumbling without subsequent striking against object, initial encounter: Secondary | ICD-10-CM | POA: Insufficient documentation

## 2012-07-03 DIAGNOSIS — Z8669 Personal history of other diseases of the nervous system and sense organs: Secondary | ICD-10-CM | POA: Diagnosis not present

## 2012-07-03 DIAGNOSIS — F411 Generalized anxiety disorder: Secondary | ICD-10-CM | POA: Insufficient documentation

## 2012-07-03 DIAGNOSIS — M549 Dorsalgia, unspecified: Secondary | ICD-10-CM

## 2012-07-03 DIAGNOSIS — Y939 Activity, unspecified: Secondary | ICD-10-CM | POA: Insufficient documentation

## 2012-07-03 DIAGNOSIS — J42 Unspecified chronic bronchitis: Secondary | ICD-10-CM | POA: Diagnosis not present

## 2012-07-03 DIAGNOSIS — Z8739 Personal history of other diseases of the musculoskeletal system and connective tissue: Secondary | ICD-10-CM | POA: Insufficient documentation

## 2012-07-03 DIAGNOSIS — S6990XA Unspecified injury of unspecified wrist, hand and finger(s), initial encounter: Secondary | ICD-10-CM | POA: Diagnosis not present

## 2012-07-03 DIAGNOSIS — Y929 Unspecified place or not applicable: Secondary | ICD-10-CM | POA: Insufficient documentation

## 2012-07-03 DIAGNOSIS — IMO0002 Reserved for concepts with insufficient information to code with codable children: Secondary | ICD-10-CM | POA: Diagnosis not present

## 2012-07-03 DIAGNOSIS — M546 Pain in thoracic spine: Secondary | ICD-10-CM | POA: Diagnosis not present

## 2012-07-03 DIAGNOSIS — M25529 Pain in unspecified elbow: Secondary | ICD-10-CM | POA: Diagnosis not present

## 2012-07-03 DIAGNOSIS — S39012A Strain of muscle, fascia and tendon of lower back, initial encounter: Secondary | ICD-10-CM

## 2012-07-03 MED ORDER — TRAMADOL HCL 50 MG PO TABS
50.0000 mg | ORAL_TABLET | Freq: Once | ORAL | Status: AC
  Administered 2012-07-03: 50 mg via ORAL
  Filled 2012-07-03: qty 1

## 2012-07-03 NOTE — ED Provider Notes (Signed)
History     CSN: [id number]  Arrival date & time 07/03/12  1402   First MD Initiated Contact with Patient 07/03/12 1613      Chief Complaint  Patient presents with  . Back Pain     Patient is a 62 y.o. female presenting with back pain. The history is provided by the patient.  Back Pain  This is a recurrent problem. The current episode started more than 1 week ago. The problem occurs constantly. The problem has not changed since onset.The pain is associated with falling. The pain is present in the thoracic spine and lumbar spine. The quality of the pain is described as stabbing and shooting. The pain is moderate. The symptoms are aggravated by bending and twisting. The pain is the same all the time. Pertinent negatives include no chest pain, no headaches, no abdominal pain, no bowel incontinence, no bladder incontinence, no leg pain, no paresis and no weakness. Treatments tried: rest. The treatment provided mild relief.  pt reports she fell 11 days ago.  She reports she slipped on ice and fell backwards on her back and also injured her arms She was seen at [facility] hospital and had right UE xray and low back xray but were negative Since then she has has left elbow xray that shows "bruising" She reports the back pain has worsened but no new falls reported No new weakness No new headache or neck pain She reports having cervical spine injection in the interim without any issue.   No cp is reported   Past Medical History  Diagnosis Date  . Osteoporosis   . Varicosities     venous  . Cardiomyopathy HX --06/2010    EF was 25% during acute illness (PHELONEPHRITIS) Repeat echo 12-06-10 60% showed normal EF.   Marland Kitchen Hypertension   . Headache   . PONV (postoperative nausea and vomiting)   . Heart murmur     DENIES S & S   (ECHO JUN'12 W/ CHART)  . History of chronic bronchitis   . Chronic back pain greater than 3 months duration     S/P BACK SURG'S  . Arachnoiditis BILATERAL LEGS    DUE  TO MULTIPLE BACK SURG.'S  . Weakness of both legs DUE TO ARACHNOIDITIS    OCCASIONAL USES CANE  . Blood transfusion   . GERD (gastroesophageal reflux disease) AND HIATIAL HERNIA    CONTROLLED W/ NEXIUM  . Acute sinusitis, unspecified   . Anxiety state, unspecified   . Essential hypertension, benign   . Dyslipidemia   . Other malaise and fatigue     Past Surgical History  Procedure Date  . Lumbar fusion 11-30-10    L4 - 5  . Exploration of incision for csf leak DEC 2011  X2    POST LAMINECOTMY  . Lumbar laminectomy DEC 2011    L4 - 5  . Lumbar fusion 2000    L2 - 4  . Cervical disc surgery 2005    C5 - 6  . Anterior fusion cervical spine 2007    C5 -6  . Tendon repair JAN 2010    LEFT INDEX AND LONG FINGERS  . Left wrist tenosynectomy w/ left thumb joint repair 02-24-10  . Abdominal hysterectomy 1987    W/ BSO  . Cholecystectomy 1994  . Knee arthroscopy LEFT X3 (LAST ONE 2005)  . Carpal tunnel release RIGHT - 2001  & DEC 2011 W/ BACK SURG.  . Knee arthroscopy 05/17/2011    Procedure: ARTHROSCOPY  KNEE;  Surgeon: Curlene Labrum III;  Location: [facility];  Service: Orthopedics;  Laterality: Right;  WITH MEDIAL MENISECTOMY AND removal of suprapatella fat lump    Family History  Problem Relation Age of Onset  . Hypertension Mother   . Heart disease Mother   . Diabetes Mother   . Cancer Father   . Diabetes Brother     History  Substance Use Topics  . Smoking status: Never Smoker   . Smokeless tobacco: Never Used  . Alcohol Use: No    OB History    Grav Para Term Preterm Abortions TAB SAB Ect Mult Living                  Review of Systems  Cardiovascular: Negative for chest pain.  Gastrointestinal: Negative for abdominal pain and bowel incontinence.  Genitourinary: Negative for bladder incontinence.  Musculoskeletal: Positive for back pain.  Neurological: Negative for weakness and headaches.  All other systems reviewed and are  negative.    Allergies  Latex; Morphine and related; Aspirin; Biaxin; Clarithromycin; Codeine; Darvocet; Lortab; and Percocet  Home Medications   Current Outpatient Rx  Name  Route  Sig  Dispense  Refill  . ALBUTEROL SULFATE HFA 108 (90 BASE) MCG/ACT IN AERS   Inhalation   Inhale 2 puffs into the lungs every 4 (four) hours as needed. For shortness of breath         . ASPIRIN 81 MG PO TABS   Oral   Take 81 mg by mouth every other day. COATED ASA every other day         . BIOTIN 10 MG PO CAPS   Oral   Take 10 mg by mouth daily.         Marland Kitchen BISOPROLOL-HYDROCHLOROTHIAZIDE 5-6.25 MG PO TABS   Oral   Take 1 tablet by mouth daily.          Marland Kitchen CALCIUM CARBONATE-VIT D-MIN 1200-1000 MG-UNIT PO CHEW   Oral   Chew by mouth 2 (two) times daily.          . CYCLOBENZAPRINE HCL 10 MG PO TABS   Oral   Take 10 mg by mouth 2 (two) times daily as needed. For muscle spams         . DIAZEPAM 10 MG PO TABS   Oral   Take 10 mg by mouth every 6 (six) hours as needed. MUSCLE SPASMS , ALTERNATES W/ FLEXERIL         . ESOMEPRAZOLE MAGNESIUM 40 MG PO CPDR   Oral   Take 40 mg by mouth every morning.          Marland Kitchen ESTROGENS CONJUGATED 1.25 MG PO TABS   Oral   Take 1.25 mg by mouth every morning.          Marland Kitchen GABAPENTIN 600 MG PO TABS   Oral   Take 1 tablet (600 mg total) by mouth 3 (three) times daily.   90 tablet   2   . IBANDRONATE SODIUM 150 MG PO TABS   Oral   Take 150 mg by mouth every 30 (thirty) days. Take in the morning with a full glass of water, on an empty stomach, and do not take anything else by mouth or lie down for the next 30 min.         Marland Kitchen LEVALBUTEROL TARTRATE 45 MCG/ACT IN AERO   Inhalation   Inhale 1-2 puffs into the lungs every 4 (four) hours as  needed. For shortness of breath         . OXYCODONE HCL 15 MG PO TABS   Oral   Take 15 mg by mouth every 8 (eight) hours as needed. For pain         . OXYMORPHONE HCL ER 40 MG PO TB12   Oral   Take 1  tablet (40 mg total) by mouth every 12 (twelve) hours.   60 tablet   0   . VITAMIN B-12 1000 MCG PO TABS   Oral   Take 1,000 mcg by mouth daily.            BP 135/77  Pulse 71  Temp 98.3 F (36.8 C) (Oral)  Resp 18  SpO2 98%  Physical Exam CONSTITUTIONAL: Well developed/well nourished HEAD AND FACE: Normocephalic/atraumatic EYES: EOMI/PERRL ENMT: Mucous membranes moist, No evidence of facial/nasal trauma NECK: supple no meningeal signs SPINE:no cervical spine tenderness.  Thoracic/lumbar tenderness noted.  Well healed scars noted to back/neck.  No bruising/erythema/crepitance noted CV: S1/S2 noted, no murmurs/rubs/gallops noted LUNGS: Lungs are clear to auscultation bilaterally, no apparent distress ABDOMEN: soft, nontender, no rebound or guarding GU:no cva tenderness NEURO: Pt is awake/alert, moves all extremitiesx4, no focal motor deficits in her extremities EXTREMITIES: pulses normal, full ROM. Tenderness with ROM of left elbow but no deformity   Small bruise on right distal forearm, All other extremities/joints palpated/ranged and nontender SKIN: warm, color normal PSYCH: no abnormalities of mood noted  ED Course  Procedures  6:16 PM Will repeat imaging.  Pt is in no distress without any focal neuro deficits.  She does not want any pain meds at this time  7:13 PM Pt improved Right FA xray from [facility] reviewed on pacs xrays today are negative She feels well for d/c home She will try a dose of tramadol which she states she can tolerate and then d/c home She has other pain meds at home  MDM  Nursing notes including past medical history and social history reviewed and considered in documentation xrays reviewed and considered Previous records reviewed and considered         Joya Gaskins, MD 07/03/12 1913

## 2012-07-03 NOTE — ED Notes (Signed)
Return from xray

## 2012-07-03 NOTE — ED Notes (Signed)
Patient transported to X-ray 

## 2012-07-03 NOTE — ED Notes (Signed)
Slipped on ice one week ago and hit head and lower and upper back on steps.  Pt was seen at another facility.  Pt was told nothing was broken.  Left elbow was swollen and she had xray that did not show fx.  Pt states that she has pain shoot up back that was severe.  Pt put this brace on herself and states it helps her pain.  No incontinence of bowel or bladder

## 2012-07-15 DIAGNOSIS — M5412 Radiculopathy, cervical region: Secondary | ICD-10-CM | POA: Diagnosis not present

## 2012-07-15 DIAGNOSIS — M542 Cervicalgia: Secondary | ICD-10-CM | POA: Diagnosis not present

## 2012-07-15 DIAGNOSIS — M47812 Spondylosis without myelopathy or radiculopathy, cervical region: Secondary | ICD-10-CM | POA: Diagnosis not present

## 2012-07-17 ENCOUNTER — Other Ambulatory Visit: Payer: Self-pay | Admitting: Neurosurgery

## 2012-07-19 ENCOUNTER — Ambulatory Visit (INDEPENDENT_AMBULATORY_CARE_PROVIDER_SITE_OTHER)
Admission: RE | Admit: 2012-07-19 | Discharge: 2012-07-19 | Disposition: A | Discharge status: Home / Self Care | Payer: Medicare Other | Source: Ambulatory Visit | Attending: Internal Medicine | Admitting: Internal Medicine

## 2012-07-19 ENCOUNTER — Encounter
Payer: Medicare Other | Attending: Physical Medicine and Rehabilitation | Admitting: Physical Medicine and Rehabilitation

## 2012-07-19 ENCOUNTER — Encounter: Payer: Self-pay | Admitting: Physical Medicine and Rehabilitation

## 2012-07-19 VITALS — BP 167/88 | HR 65 | Resp 14 | Ht 65.0 in | Wt 164.0 lb

## 2012-07-19 DIAGNOSIS — K219 Gastro-esophageal reflux disease without esophagitis: Secondary | ICD-10-CM | POA: Diagnosis not present

## 2012-07-19 DIAGNOSIS — R942 Abnormal results of pulmonary function studies: Secondary | ICD-10-CM

## 2012-07-19 DIAGNOSIS — F329 Major depressive disorder, single episode, unspecified: Secondary | ICD-10-CM | POA: Insufficient documentation

## 2012-07-19 DIAGNOSIS — M542 Cervicalgia: Secondary | ICD-10-CM | POA: Diagnosis not present

## 2012-07-19 DIAGNOSIS — M79609 Pain in unspecified limb: Secondary | ICD-10-CM | POA: Diagnosis not present

## 2012-07-19 DIAGNOSIS — F419 Anxiety disorder, unspecified: Secondary | ICD-10-CM

## 2012-07-19 DIAGNOSIS — R0989 Other specified symptoms and signs involving the circulatory and respiratory systems: Secondary | ICD-10-CM

## 2012-07-19 DIAGNOSIS — F341 Dysthymic disorder: Secondary | ICD-10-CM

## 2012-07-19 DIAGNOSIS — J984 Other disorders of lung: Secondary | ICD-10-CM | POA: Diagnosis not present

## 2012-07-19 DIAGNOSIS — R0609 Other forms of dyspnea: Secondary | ICD-10-CM

## 2012-07-19 DIAGNOSIS — F411 Generalized anxiety disorder: Secondary | ICD-10-CM | POA: Diagnosis not present

## 2012-07-19 DIAGNOSIS — M5412 Radiculopathy, cervical region: Secondary | ICD-10-CM

## 2012-07-19 DIAGNOSIS — E785 Hyperlipidemia, unspecified: Secondary | ICD-10-CM | POA: Diagnosis not present

## 2012-07-19 DIAGNOSIS — F418 Other specified anxiety disorders: Secondary | ICD-10-CM

## 2012-07-19 DIAGNOSIS — G039 Meningitis, unspecified: Secondary | ICD-10-CM | POA: Insufficient documentation

## 2012-07-19 DIAGNOSIS — R0600 Dyspnea, unspecified: Secondary | ICD-10-CM

## 2012-07-19 DIAGNOSIS — M5380 Other specified dorsopathies, site unspecified: Secondary | ICD-10-CM | POA: Diagnosis not present

## 2012-07-19 DIAGNOSIS — I10 Essential (primary) hypertension: Secondary | ICD-10-CM | POA: Insufficient documentation

## 2012-07-19 DIAGNOSIS — M25529 Pain in unspecified elbow: Secondary | ICD-10-CM | POA: Insufficient documentation

## 2012-07-19 DIAGNOSIS — M961 Postlaminectomy syndrome, not elsewhere classified: Secondary | ICD-10-CM | POA: Diagnosis not present

## 2012-07-19 DIAGNOSIS — Z981 Arthrodesis status: Secondary | ICD-10-CM | POA: Insufficient documentation

## 2012-07-19 DIAGNOSIS — F3289 Other specified depressive episodes: Secondary | ICD-10-CM | POA: Insufficient documentation

## 2012-07-19 MED ORDER — OXYCODONE HCL 15 MG PO TABS: 15.0000 mg | ORAL_TABLET | Freq: Three times a day (TID) | ORAL | Status: DC | PRN

## 2012-07-19 MED ORDER — GABAPENTIN 600 MG PO TABS: 600.0000 mg | ORAL_TABLET | Freq: Three times a day (TID) | ORAL | Status: DC

## 2012-07-19 MED ORDER — OXYMORPHONE HCL ER 40 MG PO TB12: 40.0000 mg | ORAL_TABLET | Freq: Two times a day (BID) | ORAL | Status: DC

## 2012-07-19 NOTE — Progress Notes (Signed)
Subjective:    Patient ID: [name] [name], female    DOB: June 28, 1950, 62 y.o.   MRN: [id number]  HPI The patient complains about chronic neck pain which radiates into back of her head and into her posterior right arm. The patient also complains about intermittend numbness and tingling in the posterior right arm, right hand and some of her fingertips, she can not really say which ones.  The problem has been gotten worse, but she does not want to see her neurosurgeon yet. She states, that she now has severe pain when she turns her head to the left, and she also sometimes hears a clicking sound while she is turning her head. She had a MRI, which showed multifactorial stenosis at C4-5 above her fusion C5-6. She states, that she saw Dr. Jeral Fruit and that she is scheduled for surgery on 08/09/2012.    Pain Inventory Average Pain 8 Pain Right Now 9 My pain is sharp, burning, tingling and aching  In the last 24 hours, has pain interfered with the following? General activity 8 Relation with others 7 Enjoyment of life 10 What TIME of day is your pain at its worst? all the time Sleep (in general) Fair  Pain is worse with: walking, sitting, standing and some activites Pain improves with: rest, heat/ice and medication Relief from Meds: 6  Mobility use a cane ability to climb steps?  yes do you drive?  yes  Function disabled: date disabled 2000  Neuro/Psych No problems in this area  Prior Studies Any changes since last visit?  no  Physicians involved in your care Having neck surgery in February 2014 with Dr Jeral Fruit.   Family History  Problem Relation Age of Onset  . Hypertension Mother   . Heart disease Mother   . Diabetes Mother   . Cancer Father   . Diabetes Brother    History   Social History  . Marital Status: Legally Separated    Spouse Name: N/A    Number of Children: N/A  . Years of Education: N/A   Occupational History  . disable    Social History Main Topics  .  Smoking status: Never Smoker   . Smokeless tobacco: Never Used  . Alcohol Use: No  . Drug Use: No  . Sexually Active: None   Other Topics Concern  . None   Social History Narrative   Has one childDisabled   Past Surgical History  Procedure Date  . Lumbar fusion 11-30-10    L4 - 5  . Exploration of incision for csf leak DEC 2011  X2    POST LAMINECOTMY  . Lumbar laminectomy DEC 2011    L4 - 5  . Lumbar fusion 2000    L2 - 4  . Cervical disc surgery 2005    C5 - 6  . Anterior fusion cervical spine 2007    C5 -6  . Tendon repair JAN 2010    LEFT INDEX AND LONG FINGERS  . Left wrist tenosynectomy w/ left thumb joint repair 02-24-10  . Abdominal hysterectomy 1987    W/ BSO  . Cholecystectomy 1994  . Knee arthroscopy LEFT X3 (LAST ONE 2005)  . Carpal tunnel release RIGHT - 2001  & DEC 2011 W/ BACK SURG.  . Knee arthroscopy 05/17/2011    Procedure: ARTHROSCOPY KNEE;  Surgeon: Curlene Labrum III;  Location: [facility];  Service: Orthopedics;  Laterality: Right;  WITH MEDIAL MENISECTOMY AND removal of suprapatella fat lump  Past Medical History  Diagnosis Date  . Osteoporosis   . Varicosities     venous  . Cardiomyopathy HX --06/2010    EF was 25% during acute illness (PHELONEPHRITIS) Repeat echo 12-06-10 60% showed normal EF.   Marland Kitchen Hypertension   . Headache   . PONV (postoperative nausea and vomiting)   . Heart murmur     DENIES S & S   (ECHO JUN'12 W/ CHART)  . History of chronic bronchitis   . Chronic back pain greater than 3 months duration     S/P BACK SURG'S  . Arachnoiditis BILATERAL LEGS    DUE TO MULTIPLE BACK SURG.'S  . Weakness of both legs DUE TO ARACHNOIDITIS    OCCASIONAL USES CANE  . Blood transfusion   . GERD (gastroesophageal reflux disease) AND HIATIAL HERNIA    CONTROLLED W/ NEXIUM  . Acute sinusitis, unspecified   . Anxiety state, unspecified   . Essential hypertension, benign   . Dyslipidemia   . Other malaise and fatigue     BP 167/88  Pulse 65  Resp 14  Ht 5\' 5"  (1.651 m)  Wt 164 lb (74.39 kg)  BMI 27.29 kg/m2  SpO2 99%     Review of Systems     Objective:   Physical Exam  Constitutional: She is oriented to person, place, and time. She appears well-developed and well-nourished.  HENT:  Head: Normocephalic.  Musculoskeletal: She exhibits tenderness.  Neurological: She is alert and oriented to person, place, and time.  Skin: Skin is warm and dry.  Psychiatric: She has a normal mood and affect.  Symmetric normal motor tone is noted throughout. Normal muscle bulk. Muscle testing reveals 5/5 muscle strength of the right upper extremity, left not tested today due to pain after a fall, and 5/5 of the lower extremity, some diffuse give away weakness and some diffuse tremor. Full range of motion in upper and lower extremities. ROM of spine is restricted, C-spine ext 30 degrees, flex 45 degrees, rot 45 degrees to the right, 15 degrees to the left, restricted by pain. Fine motor movements are restricted in both hands, osteoarthritis and hand surgery.  DTR in the upper and lower extremity are present and symmetric 2+. No clonus is noted.  Patient arises from chair with mild difficulty. Narrow based gait with normal arm swing bilateral , able to walk on heels and toes . Tandem walk is stable. No pronator drift. Rhomberg negative.       Assessment & Plan:  1. 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 Increased severe pain and clicking sound with rotation to the left. CT-myelogram from Dec. 2013 showed :  1. Progressive disc osteophyte complex at to C4-5 with worsening  central canal stenosis.She is scheduled for an ACDF with Dr. Jeral Fruit on 08/09/2012.  2. Stable fusion at C5-6.   2. Post lumbar post laminectomy syndrome.  3. 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  4. Anxiety/depression secondary to  the above  5. Left elbow pain with restricted ROM, x-ray of left elbow did not show significant findings, has resloved.  PLAN:  1. Discussed adjusting neurontin to take 900 mg qhs and 300mg  at breakfast and mid day. Patient did not tolerate the increased dose well, she states, that she is taking the gabapentin 600mg  tid  2. Spinal stimulator is still in the discussion for the arachnoiditis.  3. Continue with Opana ER to 40mg   q12 #60  4. Will continue oxy IR for breakthrough.  Continue to focus on appropriate posture and exercise as tolerated  6. Pamelor did not help with sleep and pain, she took one of her son's amitriptyline, which he is taking for depression, therefore higher dose.  7. Flexeril and valium prn for spasms  8. The patient will see PA in one month.. All questions were encouraged and answered.

## 2012-07-19 NOTE — Patient Instructions (Addendum)
Try to stay as active as pain permits. You could try magnesium powder for you cramps.

## 2012-07-24 ENCOUNTER — Telehealth: Payer: Self-pay | Admitting: Internal Medicine

## 2012-07-24 DIAGNOSIS — R0600 Dyspnea, unspecified: Secondary | ICD-10-CM

## 2012-07-24 DIAGNOSIS — R911 Solitary pulmonary nodule: Secondary | ICD-10-CM

## 2012-07-24 NOTE — Telephone Encounter (Signed)
CT chest does not show reason for shortness of breath. Next to do is methacholine challenge test; ordered. She is do and give [place] a call so I can review it. She know the algorithm  CT does show 3mm lung nodules -very very very rare < 1:1000 chance it is serious like cancer. She needs repeat ct chest in 1 year without cotnrast (ordered)   Dr. Kalman Shan, M.D., [facility].C.P Pulmonary and Critical Care Medicine Staff Physician [facility] Pager: [phone], If no answer or between  15:00h - 7:00h: call [phone]  07/24/2012 12:18 PM

## 2012-07-25 ENCOUNTER — Encounter (HOSPITAL_COMMUNITY): Payer: Self-pay | Admitting: Pharmacy Technician

## 2012-07-26 DIAGNOSIS — B009 Herpesviral infection, unspecified: Secondary | ICD-10-CM | POA: Diagnosis not present

## 2012-07-26 NOTE — Telephone Encounter (Signed)
[email or web address].Lan Entsminger, CMA  

## 2012-07-30 NOTE — Telephone Encounter (Signed)
[email or web address]. Oluwatobi Ruppe, CMA  

## 2012-08-01 ENCOUNTER — Encounter (HOSPITAL_COMMUNITY): Payer: Medicare Other

## 2012-08-02 ENCOUNTER — Other Ambulatory Visit (HOSPITAL_COMMUNITY): Payer: Medicare Other

## 2012-08-02 ENCOUNTER — Encounter (HOSPITAL_COMMUNITY): Payer: Medicare Other

## 2012-08-03 ENCOUNTER — Other Ambulatory Visit: Payer: Self-pay

## 2012-08-05 ENCOUNTER — Telehealth: Payer: Self-pay | Admitting: Internal Medicine

## 2012-08-05 NOTE — Telephone Encounter (Signed)
Spoke with pt and notified of results/recs per Dr. Marchelle Gearing . Pt verbalized understanding and denied any questions. She is scheduled for MCT on 2/19, having neck surgery the next wk, so will call us once she is sure when she can come in.

## 2012-08-05 NOTE — Telephone Encounter (Signed)
See phone note dated 07/24/12

## 2012-08-07 ENCOUNTER — Ambulatory Visit (HOSPITAL_COMMUNITY)
Admission: RE | Admit: 2012-08-07 | Discharge: 2012-08-07 | Disposition: A | Discharge status: Home / Self Care | Payer: Medicare Other | Source: Ambulatory Visit | Attending: Internal Medicine | Admitting: Internal Medicine

## 2012-08-07 ENCOUNTER — Encounter (HOSPITAL_COMMUNITY)
Admission: RE | Admit: 2012-08-07 | Discharge: 2012-08-07 | Disposition: A | Discharge status: Home / Self Care | Payer: Medicare Other | Source: Ambulatory Visit | Attending: Neurosurgery | Admitting: Neurosurgery

## 2012-08-07 ENCOUNTER — Encounter (HOSPITAL_COMMUNITY): Payer: Self-pay

## 2012-08-07 DIAGNOSIS — I10 Essential (primary) hypertension: Secondary | ICD-10-CM | POA: Diagnosis present

## 2012-08-07 DIAGNOSIS — Z886 Allergy status to analgesic agent status: Secondary | ICD-10-CM | POA: Diagnosis not present

## 2012-08-07 DIAGNOSIS — M47812 Spondylosis without myelopathy or radiculopathy, cervical region: Secondary | ICD-10-CM | POA: Diagnosis present

## 2012-08-07 DIAGNOSIS — F411 Generalized anxiety disorder: Secondary | ICD-10-CM | POA: Diagnosis present

## 2012-08-07 DIAGNOSIS — Z888 Allergy status to other drugs, medicaments and biological substances status: Secondary | ICD-10-CM | POA: Diagnosis not present

## 2012-08-07 DIAGNOSIS — M5030 Other cervical disc degeneration, unspecified cervical region: Secondary | ICD-10-CM | POA: Diagnosis not present

## 2012-08-07 DIAGNOSIS — Z833 Family history of diabetes mellitus: Secondary | ICD-10-CM | POA: Diagnosis not present

## 2012-08-07 DIAGNOSIS — K219 Gastro-esophageal reflux disease without esophagitis: Secondary | ICD-10-CM | POA: Diagnosis present

## 2012-08-07 DIAGNOSIS — R0989 Other specified symptoms and signs involving the circulatory and respiratory systems: Secondary | ICD-10-CM | POA: Insufficient documentation

## 2012-08-07 DIAGNOSIS — I428 Other cardiomyopathies: Secondary | ICD-10-CM | POA: Diagnosis not present

## 2012-08-07 DIAGNOSIS — M5090 Cervical disc disorder, unspecified, unspecified cervical region: Secondary | ICD-10-CM | POA: Diagnosis not present

## 2012-08-07 DIAGNOSIS — Z79899 Other long term (current) drug therapy: Secondary | ICD-10-CM | POA: Diagnosis not present

## 2012-08-07 DIAGNOSIS — M5412 Radiculopathy, cervical region: Secondary | ICD-10-CM | POA: Diagnosis not present

## 2012-08-07 DIAGNOSIS — Z981 Arthrodesis status: Secondary | ICD-10-CM | POA: Diagnosis not present

## 2012-08-07 DIAGNOSIS — R0609 Other forms of dyspnea: Secondary | ICD-10-CM | POA: Insufficient documentation

## 2012-08-07 DIAGNOSIS — Z8249 Family history of ischemic heart disease and other diseases of the circulatory system: Secondary | ICD-10-CM | POA: Diagnosis not present

## 2012-08-07 DIAGNOSIS — R918 Other nonspecific abnormal finding of lung field: Secondary | ICD-10-CM | POA: Diagnosis not present

## 2012-08-07 DIAGNOSIS — Z01812 Encounter for preprocedural laboratory examination: Secondary | ICD-10-CM | POA: Diagnosis not present

## 2012-08-07 DIAGNOSIS — Z01818 Encounter for other preprocedural examination: Secondary | ICD-10-CM | POA: Diagnosis not present

## 2012-08-07 DIAGNOSIS — M810 Age-related osteoporosis without current pathological fracture: Secondary | ICD-10-CM | POA: Diagnosis not present

## 2012-08-07 DIAGNOSIS — E785 Hyperlipidemia, unspecified: Secondary | ICD-10-CM | POA: Diagnosis present

## 2012-08-07 DIAGNOSIS — M542 Cervicalgia: Secondary | ICD-10-CM | POA: Diagnosis not present

## 2012-08-07 DIAGNOSIS — Z7982 Long term (current) use of aspirin: Secondary | ICD-10-CM | POA: Diagnosis not present

## 2012-08-07 HISTORY — DX: Cardiac murmur, unspecified: R01.1

## 2012-08-07 HISTORY — DX: Unspecified osteoarthritis, unspecified site: M19.90

## 2012-08-07 HISTORY — DX: Myoneural disorder, unspecified: G70.9

## 2012-08-07 HISTORY — DX: Personal history of urinary (tract) infections: Z87.440

## 2012-08-07 HISTORY — DX: Shortness of breath: R06.02

## 2012-08-07 LAB — CBC
MCH: 32.5 pg (ref 26.0–34.0)
MCHC: 33.7 g/dL (ref 30.0–36.0)
MCV: 96.5 fL (ref 78.0–100.0)
Platelets: 188 10*3/uL (ref 150–400)
RDW: 13.2 % (ref 11.5–15.5)

## 2012-08-07 LAB — PULMONARY FUNCTION TEST

## 2012-08-07 LAB — BASIC METABOLIC PANEL
BUN: 11 mg/dL (ref 6–23)
Calcium: 9 mg/dL (ref 8.4–10.5)
Creatinine, Ser: 0.63 mg/dL (ref 0.50–1.10)
GFR calc non Af Amer: >90 mL/min (ref >90)
Glucose, Bld: 76 mg/dL (ref 70–99)
Sodium: 140 mEq/L (ref 135–145)

## 2012-08-07 MED ORDER — ALBUTEROL SULFATE (5 MG/ML) 0.5% IN NEBU
2.5000 mg | INHALATION_SOLUTION | Freq: Once | RESPIRATORY_TRACT | Status: AC
  Administered 2012-08-07: 2.5 mg via RESPIRATORY_TRACT

## 2012-08-07 MED ORDER — METHACHOLINE 4 MG/ML NEB SOLN
2.0000 mL | Freq: Once | RESPIRATORY_TRACT | Status: AC
  Administered 2012-08-07: 8 mg via RESPIRATORY_TRACT

## 2012-08-07 MED ORDER — METHACHOLINE 0.0625 MG/ML NEB SOLN
2.0000 mL | Freq: Once | RESPIRATORY_TRACT | Status: AC
  Administered 2012-08-07: 0.125 mg via RESPIRATORY_TRACT

## 2012-08-07 MED ORDER — METHACHOLINE 0.25 MG/ML NEB SOLN
2.0000 mL | Freq: Once | RESPIRATORY_TRACT | Status: AC
  Administered 2012-08-07: 0.5 mg via RESPIRATORY_TRACT

## 2012-08-07 MED ORDER — METHACHOLINE 16 MG/ML NEB SOLN: 2.0000 mL | Freq: Once | RESPIRATORY_TRACT | Status: DC

## 2012-08-07 MED ORDER — METHACHOLINE 1 MG/ML NEB SOLN
2.0000 mL | Freq: Once | RESPIRATORY_TRACT | Status: AC
  Administered 2012-08-07: 2 mg via RESPIRATORY_TRACT

## 2012-08-07 MED ORDER — SODIUM CHLORIDE 0.9 % IN NEBU
3.0000 mL | INHALATION_SOLUTION | Freq: Once | RESPIRATORY_TRACT | Status: AC
  Administered 2012-08-07: 3 mL via RESPIRATORY_TRACT

## 2012-08-07 NOTE — Pre-Procedure Instructions (Addendum)
[name] [name]  08/07/2012   Your procedure is scheduled on:  Aug 09, 2012  Report to [facility] at 5:30 AM.  Call this number if you have problems the morning of surgery: [phone]   Remember:   Do not eat food or drink liquids after midnight.   Take these medicines the morning of surgery with A SIP OF WATER: inhaler as needed (bring with you), muscle relaxer as needed, nexium, pain pill if needed  D/C ASPIRIN, COUMADIN, HERBAL MEDICATIONS 7 DAYS PRIOR TO SURGERY   Do not wear jewelry, make-up or nail polish.  Do not wear lotions, powders, or perfumes. You may wear deodorant.  Do not shave 48 hours prior to surgery. Men may shave face and neck.  Do not bring valuables to the hospital.  Contacts, dentures or bridgework may not be worn into surgery.  Leave suitcase in the car. After surgery it may be brought to your room.  For patients admitted to the hospital, checkout time is 11:00 AM the day of  discharge.   Patients discharged the day of surgery will not be allowed to drive  home.  Name and phone number of your driver:   Special Instructions: Shower using CHG 2 nights before surgery and the night before surgery.  If you shower the day of surgery use CHG.  Use special wash - you have one bottle of CHG for all showers.  You should use approximately 1/3 of the bottle for each shower.   Please read over the following fact sheets that you were given: Pain Booklet, Coughing and Deep Breathing and Surgical Site Infection Prevention

## 2012-08-08 MED ORDER — CEFAZOLIN SODIUM-DEXTROSE 2-3 GM-% IV SOLR
2.0000 g | INTRAVENOUS | Status: AC
  Administered 2012-08-09: 2 g via INTRAVENOUS
  Filled 2012-08-08: qty 50

## 2012-08-08 NOTE — Consult Note (Signed)
Anesthesia Chart Review:  Patient is a 62 year old female scheduled for C4-5 ACDF by Dr. Jeral Fruit on 08/09/12.  History includes non-smoker but with second hand exposure, post-operative N/V, arachnoiditis, dyslipidemia, anxiety, HTN, murmur, stress induced cardiomyopathy during acute illness (pyelonephritis) with EF 25% 06/2010 with repeat echo 11/2010 showing EF up to 60%, headaches, chronic bronchitis, lumbar fusion '12.  PCP is Dr. Olena Leatherwood. Pulmonologist is Dr. Marchelle Gearing.  Cardiologist is Dr. Antoine Poche.  She has had recent cardiology and pulmonology evaluations for dyspnea.  No cardiac etiology notes.  So far, Dr. Marchelle Gearing has ordered a chest CT and  PFTs (see below) and a methacholine challenge test (results currently pending).   She does have an albuterol MDI PRN. She tolerated lumbar fusion in 11/2010.  Nuclear stress test on 04/01/12 showed: Normal stress nuclear study. LV Ejection Fraction: 65%. LV Wall Motion: NL LV Function; NL Wall Motion.  Echo on 12/06/10 (under Notes tab) showed normal LV cavity size and wall thickness, EF 60%.  Normal wall motion with no regional wall motion abnormalities.  PA peak pressure 36 mm Hg (S).  Trivial TR.  EKG on 03/07/12 showed SB @ 59 bpm.  Chest CT on 07/19/12 showed: 1. No evidence of interstitial lung disease.  2. Multiple tiny 2-3 mm nodules scattered throughout the lung  bases bilaterally, as above.  Dr. Marchelle Gearing recommended repeat chest CT in 1 year.  Since the patient had recently had a chest CT, she was hoping she would not also require a pre-operative CXR.  I felt that likely, if no acute cardiopulmonary symptoms, her recent CT scan would suffice; however, it will ultimately be up to her anesthesiologist on the day of surgery.  PFT report from 05/27/12 showed: Spirometry is WNL.  Lung volumes are WNL.  There is a mild decrease in diffusing capacity.  FEV1 changed by 8%.  FEF 25-75 changed by 22%. This is interpreted as a mild response to  bronchodilator.  Preoperative labs noted.  She will be evaluated by her assigned anesthesiologist on the day of surgery.  If no acute cardiopulmonary symptoms then would anticipate she could proceed as planned.  Shonna Chock, PA-C 08/08/12 1041

## 2012-08-09 ENCOUNTER — Ambulatory Visit (HOSPITAL_COMMUNITY): Payer: Medicare Other

## 2012-08-09 ENCOUNTER — Encounter (HOSPITAL_COMMUNITY): Payer: Self-pay | Admitting: Vascular Surgery

## 2012-08-09 ENCOUNTER — Encounter (HOSPITAL_COMMUNITY): Payer: Self-pay

## 2012-08-09 ENCOUNTER — Inpatient Hospital Stay (HOSPITAL_COMMUNITY)
Admission: RE | Admit: 2012-08-09 | Discharge: 2012-08-11 | DRG: 472 | Disposition: A | Discharge status: Home / Self Care | Payer: Medicare Other | Source: Ambulatory Visit | Attending: Neurosurgery | Admitting: Neurosurgery

## 2012-08-09 ENCOUNTER — Ambulatory Visit (HOSPITAL_COMMUNITY): Payer: Medicare Other | Admitting: Anesthesiology

## 2012-08-09 ENCOUNTER — Encounter (HOSPITAL_COMMUNITY)
Admission: RE | Disposition: A | Discharge status: Home / Self Care | Payer: Self-pay | Source: Ambulatory Visit | Attending: Neurosurgery

## 2012-08-09 DIAGNOSIS — K219 Gastro-esophageal reflux disease without esophagitis: Secondary | ICD-10-CM | POA: Diagnosis present

## 2012-08-09 DIAGNOSIS — E785 Hyperlipidemia, unspecified: Secondary | ICD-10-CM | POA: Diagnosis present

## 2012-08-09 DIAGNOSIS — M542 Cervicalgia: Secondary | ICD-10-CM | POA: Diagnosis not present

## 2012-08-09 DIAGNOSIS — F411 Generalized anxiety disorder: Secondary | ICD-10-CM | POA: Diagnosis present

## 2012-08-09 DIAGNOSIS — Z886 Allergy status to analgesic agent status: Secondary | ICD-10-CM

## 2012-08-09 DIAGNOSIS — Z981 Arthrodesis status: Secondary | ICD-10-CM

## 2012-08-09 DIAGNOSIS — M47812 Spondylosis without myelopathy or radiculopathy, cervical region: Secondary | ICD-10-CM | POA: Diagnosis not present

## 2012-08-09 DIAGNOSIS — Z79899 Other long term (current) drug therapy: Secondary | ICD-10-CM

## 2012-08-09 DIAGNOSIS — Z7982 Long term (current) use of aspirin: Secondary | ICD-10-CM

## 2012-08-09 DIAGNOSIS — Z01818 Encounter for other preprocedural examination: Secondary | ICD-10-CM | POA: Diagnosis not present

## 2012-08-09 DIAGNOSIS — Z833 Family history of diabetes mellitus: Secondary | ICD-10-CM

## 2012-08-09 DIAGNOSIS — M5090 Cervical disc disorder, unspecified, unspecified cervical region: Secondary | ICD-10-CM | POA: Diagnosis not present

## 2012-08-09 DIAGNOSIS — M810 Age-related osteoporosis without current pathological fracture: Secondary | ICD-10-CM | POA: Diagnosis present

## 2012-08-09 DIAGNOSIS — M5030 Other cervical disc degeneration, unspecified cervical region: Principal | ICD-10-CM | POA: Diagnosis present

## 2012-08-09 DIAGNOSIS — I10 Essential (primary) hypertension: Secondary | ICD-10-CM | POA: Diagnosis present

## 2012-08-09 DIAGNOSIS — I428 Other cardiomyopathies: Secondary | ICD-10-CM | POA: Diagnosis present

## 2012-08-09 DIAGNOSIS — R918 Other nonspecific abnormal finding of lung field: Secondary | ICD-10-CM | POA: Diagnosis not present

## 2012-08-09 DIAGNOSIS — Z8249 Family history of ischemic heart disease and other diseases of the circulatory system: Secondary | ICD-10-CM

## 2012-08-09 DIAGNOSIS — Z888 Allergy status to other drugs, medicaments and biological substances status: Secondary | ICD-10-CM

## 2012-08-09 DIAGNOSIS — Z01812 Encounter for preprocedural laboratory examination: Secondary | ICD-10-CM

## 2012-08-09 DIAGNOSIS — M5412 Radiculopathy, cervical region: Secondary | ICD-10-CM | POA: Diagnosis not present

## 2012-08-09 HISTORY — PX: ANTERIOR CERVICAL DECOMP/DISCECTOMY FUSION: SHX1161

## 2012-08-09 SURGERY — ANTERIOR CERVICAL DECOMPRESSION/DISCECTOMY FUSION 1 LEVEL
Anesthesia: General | Site: Neck | Wound class: Clean

## 2012-08-09 MED ORDER — BIOTENE DRY MOUTH MT LIQD
15.0000 mL | Freq: Two times a day (BID) | OROMUCOSAL | Status: DC
  Administered 2012-08-09 – 2012-08-10 (×2): 15 mL via OROMUCOSAL

## 2012-08-09 MED ORDER — SODIUM CHLORIDE 0.9 % IV SOLN
INTRAVENOUS | Status: DC
  Administered 2012-08-09: 13:00:00 via INTRAVENOUS

## 2012-08-09 MED ORDER — ESTROGENS CONJUGATED 1.25 MG PO TABS
1.2500 mg | ORAL_TABLET | ORAL | Status: DC
  Administered 2012-08-10 – 2012-08-11 (×2): 1.25 mg via ORAL
  Filled 2012-08-09 (×4): qty 1

## 2012-08-09 MED ORDER — MENTHOL 3 MG MT LOZG
1.0000 | LOZENGE | OROMUCOSAL | Status: DC | PRN
  Filled 2012-08-09: qty 9

## 2012-08-09 MED ORDER — OXYCODONE-ACETAMINOPHEN 5-325 MG PO TABS: 1.0000 | ORAL_TABLET | ORAL | Status: DC | PRN

## 2012-08-09 MED ORDER — ROCURONIUM BROMIDE 100 MG/10ML IV SOLN
INTRAVENOUS | Status: DC | PRN
  Administered 2012-08-09: 35 mg via INTRAVENOUS

## 2012-08-09 MED ORDER — ACETAMINOPHEN 650 MG RE SUPP: 650.0000 mg | RECTAL | Status: DC | PRN

## 2012-08-09 MED ORDER — ONDANSETRON HCL 4 MG/2ML IJ SOLN: 4.0000 mg | Freq: Once | INTRAMUSCULAR | Status: DC | PRN

## 2012-08-09 MED ORDER — ARTIFICIAL TEARS OP OINT
TOPICAL_OINTMENT | OPHTHALMIC | Status: DC | PRN
  Administered 2012-08-09: 1 via OPHTHALMIC

## 2012-08-09 MED ORDER — HYDROMORPHONE HCL PF 1 MG/ML IJ SOLN
INTRAMUSCULAR | Status: AC
  Filled 2012-08-09: qty 1

## 2012-08-09 MED ORDER — 0.9 % SODIUM CHLORIDE (POUR BTL) OPTIME
TOPICAL | Status: DC | PRN
  Administered 2012-08-09: 1000 mL

## 2012-08-09 MED ORDER — LACTATED RINGERS IV SOLN
INTRAVENOUS | Status: DC | PRN
  Administered 2012-08-09 (×2): via INTRAVENOUS

## 2012-08-09 MED ORDER — DEXAMETHASONE 4 MG PO TABS
4.0000 mg | ORAL_TABLET | Freq: Four times a day (QID) | ORAL | Status: DC
  Administered 2012-08-09 – 2012-08-11 (×9): 4 mg via ORAL
  Filled 2012-08-09 (×12): qty 1

## 2012-08-09 MED ORDER — NEOSTIGMINE METHYLSULFATE 1 MG/ML IJ SOLN
INTRAMUSCULAR | Status: DC | PRN
  Administered 2012-08-09: 4 mg via INTRAVENOUS

## 2012-08-09 MED ORDER — MIDAZOLAM HCL 5 MG/5ML IJ SOLN
INTRAMUSCULAR | Status: DC | PRN
  Administered 2012-08-09: 1 mg via INTRAVENOUS

## 2012-08-09 MED ORDER — FENTANYL CITRATE 0.05 MG/ML IJ SOLN
INTRAMUSCULAR | Status: DC | PRN
  Administered 2012-08-09: 75 ug via INTRAVENOUS
  Administered 2012-08-09: 50 ug via INTRAVENOUS
  Administered 2012-08-09: 75 ug via INTRAVENOUS

## 2012-08-09 MED ORDER — DEXTROSE 5 % IV SOLN
INTRAVENOUS | Status: DC | PRN
  Administered 2012-08-09: 08:00:00 via INTRAVENOUS

## 2012-08-09 MED ORDER — SODIUM CHLORIDE 0.9 % IV SOLN: 250.0000 mL | INTRAVENOUS | Status: DC

## 2012-08-09 MED ORDER — ACETAMINOPHEN 325 MG PO TABS: 650.0000 mg | ORAL_TABLET | ORAL | Status: DC | PRN

## 2012-08-09 MED ORDER — PHENYLEPHRINE HCL 10 MG/ML IJ SOLN
INTRAMUSCULAR | Status: DC | PRN
  Administered 2012-08-09 (×2): 40 ug via INTRAVENOUS
  Administered 2012-08-09: 80 ug via INTRAVENOUS

## 2012-08-09 MED ORDER — VALACYCLOVIR HCL 500 MG PO TABS
1000.0000 mg | ORAL_TABLET | Freq: Two times a day (BID) | ORAL | Status: DC
  Administered 2012-08-09 – 2012-08-11 (×4): 1000 mg via ORAL
  Filled 2012-08-09 (×6): qty 2

## 2012-08-09 MED ORDER — GABAPENTIN 600 MG PO TABS
600.0000 mg | ORAL_TABLET | Freq: Three times a day (TID) | ORAL | Status: DC
  Administered 2012-08-09 – 2012-08-11 (×7): 600 mg via ORAL
  Filled 2012-08-09 (×9): qty 1

## 2012-08-09 MED ORDER — PROPOFOL 10 MG/ML IV BOLUS
INTRAVENOUS | Status: DC | PRN
  Administered 2012-08-09: 160 mg via INTRAVENOUS

## 2012-08-09 MED ORDER — CEFAZOLIN SODIUM 1-5 GM-% IV SOLN
1.0000 g | Freq: Three times a day (TID) | INTRAVENOUS | Status: AC
  Administered 2012-08-09 (×2): 1 g via INTRAVENOUS
  Filled 2012-08-09 (×2): qty 50

## 2012-08-09 MED ORDER — HEMOSTATIC AGENTS (NO CHARGE) OPTIME
TOPICAL | Status: DC | PRN
  Administered 2012-08-09: 1 via TOPICAL

## 2012-08-09 MED ORDER — HYDROMORPHONE HCL 2 MG PO TABS
4.0000 mg | ORAL_TABLET | ORAL | Status: DC | PRN
  Administered 2012-08-09 – 2012-08-10 (×6): 4 mg via ORAL
  Administered 2012-08-10: 2 mg via ORAL
  Administered 2012-08-11: 4 mg via ORAL
  Administered 2012-08-11 (×2): 2 mg via ORAL
  Filled 2012-08-09: qty 2
  Filled 2012-08-09 (×3): qty 1
  Filled 2012-08-09 (×4): qty 2
  Filled 2012-08-09: qty 1
  Filled 2012-08-09 (×2): qty 2

## 2012-08-09 MED ORDER — DEXAMETHASONE SODIUM PHOSPHATE 4 MG/ML IJ SOLN
4.0000 mg | Freq: Four times a day (QID) | INTRAMUSCULAR | Status: DC
  Filled 2012-08-09 (×8): qty 1

## 2012-08-09 MED ORDER — PANTOPRAZOLE SODIUM 40 MG PO TBEC
40.0000 mg | DELAYED_RELEASE_TABLET | Freq: Every day | ORAL | Status: DC
  Administered 2012-08-10 – 2012-08-11 (×2): 40 mg via ORAL
  Filled 2012-08-09 (×2): qty 1

## 2012-08-09 MED ORDER — DIAZEPAM 5 MG PO TABS
5.0000 mg | ORAL_TABLET | Freq: Four times a day (QID) | ORAL | Status: DC | PRN
  Administered 2012-08-09: 5 mg via ORAL
  Filled 2012-08-09: qty 1

## 2012-08-09 MED ORDER — ZOLPIDEM TARTRATE 5 MG PO TABS
5.0000 mg | ORAL_TABLET | Freq: Every evening | ORAL | Status: DC | PRN
  Administered 2012-08-10: 5 mg via ORAL
  Filled 2012-08-09: qty 1

## 2012-08-09 MED ORDER — LIDOCAINE HCL (CARDIAC) 20 MG/ML IV SOLN
INTRAVENOUS | Status: DC | PRN
  Administered 2012-08-09: 70 mg via INTRAVENOUS

## 2012-08-09 MED ORDER — ONDANSETRON HCL 4 MG/2ML IJ SOLN
INTRAMUSCULAR | Status: DC | PRN
  Administered 2012-08-09: 4 mg via INTRAVENOUS

## 2012-08-09 MED ORDER — GLYCOPYRROLATE 0.2 MG/ML IJ SOLN
INTRAMUSCULAR | Status: DC | PRN
  Administered 2012-08-09: 0.6 mg via INTRAVENOUS

## 2012-08-09 MED ORDER — HYDROMORPHONE HCL PF 1 MG/ML IJ SOLN
0.2500 mg | INTRAMUSCULAR | Status: DC | PRN
  Administered 2012-08-09 (×4): 0.5 mg via INTRAVENOUS

## 2012-08-09 MED ORDER — ONDANSETRON HCL 4 MG/2ML IJ SOLN: 4.0000 mg | INTRAMUSCULAR | Status: DC | PRN

## 2012-08-09 MED ORDER — BISOPROLOL-HYDROCHLOROTHIAZIDE 5-6.25 MG PO TABS
1.0000 | ORAL_TABLET | Freq: Every day | ORAL | Status: DC
  Administered 2012-08-10 – 2012-08-11 (×2): 1 via ORAL
  Filled 2012-08-09 (×3): qty 1

## 2012-08-09 MED ORDER — SODIUM CHLORIDE 0.9 % IJ SOLN
3.0000 mL | Freq: Two times a day (BID) | INTRAMUSCULAR | Status: DC
  Administered 2012-08-10 (×2): 3 mL via INTRAVENOUS

## 2012-08-09 MED ORDER — IBANDRONATE SODIUM 150 MG PO TABS: 150.0000 mg | ORAL_TABLET | ORAL | Status: DC

## 2012-08-09 MED ORDER — LEVALBUTEROL TARTRATE 45 MCG/ACT IN AERO
1.0000 | INHALATION_SPRAY | RESPIRATORY_TRACT | Status: DC | PRN
  Filled 2012-08-09: qty 15

## 2012-08-09 MED ORDER — MORPHINE SULFATE 2 MG/ML IJ SOLN: 1.0000 mg | INTRAMUSCULAR | Status: DC | PRN

## 2012-08-09 MED ORDER — PHENOL 1.4 % MT LIQD: 1.0000 | OROMUCOSAL | Status: DC | PRN

## 2012-08-09 MED ORDER — THROMBIN 5000 UNITS EX SOLR
CUTANEOUS | Status: DC | PRN
  Administered 2012-08-09 (×3): 5000 [IU] via TOPICAL

## 2012-08-09 MED ORDER — SODIUM CHLORIDE 0.9 % IJ SOLN: 3.0000 mL | INTRAMUSCULAR | Status: DC | PRN

## 2012-08-09 SURGICAL SUPPLY — 50 items
BANDAGE GAUZE ELAST BULKY 4 IN (GAUZE/BANDAGES/DRESSINGS) ×4 IMPLANT
BENZOIN TINCTURE PRP APPL 2/3 (GAUZE/BANDAGES/DRESSINGS) ×2 IMPLANT
BIT DRILL SPINE QC 12 (BIT) ×2 IMPLANT
BLADE ULTRA TIP 2M (BLADE) ×2 IMPLANT
BUR BARREL STRAIGHT FLUTE 4.0 (BURR) IMPLANT
BUR MATCHSTICK NEURO 3.0 LAGG (BURR) ×2 IMPLANT
CANISTER SUCTION 2500CC (MISCELLANEOUS) ×2 IMPLANT
CLOTH BEACON ORANGE TIMEOUT ST (SAFETY) ×2 IMPLANT
CONT SPEC 4OZ CLIKSEAL STRL BL (MISCELLANEOUS) ×2 IMPLANT
COVER MAYO STAND STRL (DRAPES) ×2 IMPLANT
DRAPE C-ARM 42X72 X-RAY (DRAPES) ×4 IMPLANT
DRAPE LAPAROTOMY 100X72 PEDS (DRAPES) ×2 IMPLANT
DRAPE MICROSCOPE LEICA (MISCELLANEOUS) ×2 IMPLANT
DRAPE POUCH INSTRU U-SHP 10X18 (DRAPES) ×2 IMPLANT
DURAPREP 6ML APPLICATOR 50/CS (WOUND CARE) ×2 IMPLANT
ELECT REM PT RETURN 9FT ADLT (ELECTROSURGICAL) ×2
ELECTRODE REM PT RTRN 9FT ADLT (ELECTROSURGICAL) ×1 IMPLANT
GAUZE SPONGE 4X4 16PLY XRAY LF (GAUZE/BANDAGES/DRESSINGS) IMPLANT
GLOVE BIOGEL M 8.0 STRL (GLOVE) IMPLANT
GLOVE BIOGEL PI IND STRL 8 (GLOVE) ×3 IMPLANT
GLOVE BIOGEL PI INDICATOR 8 (GLOVE) ×3
GLOVE EXAM NITRILE LRG STRL (GLOVE) ×2 IMPLANT
GLOVE EXAM NITRILE MD LF STRL (GLOVE) IMPLANT
GLOVE EXAM NITRILE XL STR (GLOVE) IMPLANT
GLOVE EXAM NITRILE XS STR PU (GLOVE) IMPLANT
GLOVE INDICATOR 8.0 STRL GRN (GLOVE) ×10 IMPLANT
GOWN BRE IMP SLV AUR LG STRL (GOWN DISPOSABLE) ×2 IMPLANT
GOWN BRE IMP SLV AUR XL STRL (GOWN DISPOSABLE) ×2 IMPLANT
GOWN STRL REIN 2XL LVL4 (GOWN DISPOSABLE) IMPLANT
HEMOSTAT POWDER KIT SURGIFOAM (HEMOSTASIS) ×2 IMPLANT
KIT BASIN OR (CUSTOM PROCEDURE TRAY) ×2 IMPLANT
KIT ROOM TURNOVER OR (KITS) ×2 IMPLANT
NEEDLE SPNL 22GX3.5 QUINCKE BK (NEEDLE) ×2 IMPLANT
NS IRRIG 1000ML POUR BTL (IV SOLUTION) ×2 IMPLANT
PACK LAMINECTOMY NEURO (CUSTOM PROCEDURE TRAY) ×2 IMPLANT
PATTIES SURGICAL .5 X1 (DISPOSABLE) ×2 IMPLANT
PLATE ANT CERV XTEND 1 LV 12 (Plate) ×2 IMPLANT
PUTTY BONE GRAFT KIT 2.5ML (Bone Implant) ×2 IMPLANT
RUBBERBAND STERILE (MISCELLANEOUS) ×4 IMPLANT
SCREW XTD VAR 4.2 SELF TAP 12 (Screw) ×8 IMPLANT
SPACER ACDF SM LORDOTIC 7 (Spacer) ×2 IMPLANT
SPONGE GAUZE 4X4 12PLY (GAUZE/BANDAGES/DRESSINGS) ×2 IMPLANT
SPONGE INTESTINAL PEANUT (DISPOSABLE) ×4 IMPLANT
SPONGE SURGIFOAM ABS GEL SZ50 (HEMOSTASIS) ×2 IMPLANT
STRIP CLOSURE SKIN 1/2X4 (GAUZE/BANDAGES/DRESSINGS) ×2 IMPLANT
SUT VIC AB 3-0 SH 8-18 (SUTURE) ×2 IMPLANT
SYR 20ML ECCENTRIC (SYRINGE) ×2 IMPLANT
TOWEL OR 17X24 6PK STRL BLUE (TOWEL DISPOSABLE) ×2 IMPLANT
TOWEL OR 17X26 10 PK STRL BLUE (TOWEL DISPOSABLE) ×2 IMPLANT
WATER STERILE IRR 1000ML POUR (IV SOLUTION) ×2 IMPLANT

## 2012-08-09 NOTE — Progress Notes (Signed)
Received patient from PACU; [name] Joiner, RN gave report on patient at bedside.  Patient alert and oriented x4.  Complains of pain in her shoulders.  Patient oriented to room and unit.  Vitals stable.  Will continue to monitor.

## 2012-08-09 NOTE — Transfer of Care (Signed)
Immediate Anesthesia Transfer of Care Note  Patient: [name] [name]  Procedure(s) Performed: Procedure(s) with comments: ANTERIOR CERVICAL DECOMPRESSION/DISCECTOMY FUSION 1 LEVEL (N/A) - Cervical four-five  Anterior cervical decompression/diskectomy/fusion  Patient Location: PACU  Anesthesia Type:General  Level of Consciousness: awake, alert , oriented and patient cooperative  Airway & Oxygen Therapy: Patient Spontanous Breathing and Patient connected to nasal cannula oxygen  Post-op Assessment: Report given to PACU RN, Post -op Vital signs reviewed and stable and Patient moving all extremities  Post vital signs: Reviewed and stable  Complications: No apparent anesthesia complications

## 2012-08-09 NOTE — Anesthesia Postprocedure Evaluation (Signed)
  Anesthesia Post-op Note  Patient: [name] [name]  Procedure(s) Performed: Procedure(s) with comments: ANTERIOR CERVICAL DECOMPRESSION/DISCECTOMY FUSION 1 LEVEL (N/A) - Cervical four-five  Anterior cervical decompression/diskectomy/fusion  Patient Location: PACU  Anesthesia Type:General  Level of Consciousness: awake, oriented and patient cooperative  Airway and Oxygen Therapy: Patient Spontanous Breathing  Post-op Pain: mild  Post-op Assessment: Post-op Vital signs reviewed, Patient's Cardiovascular Status Stable, Respiratory Function Stable, Patent Airway, No signs of Nausea or vomiting and Pain level controlled  Post-op Vital Signs: stable  Complications: No apparent anesthesia complications

## 2012-08-09 NOTE — Preoperative (Signed)
Beta Blockers   Reason not to administer Beta Blockers:Not Applicable 

## 2012-08-09 NOTE — Progress Notes (Signed)
UR COMPLETED  

## 2012-08-09 NOTE — Progress Notes (Signed)
Patient has morphine and percocet ordered for pain; patient has allergies to both of these.  Dr. Jeral Fruit notified, new orders received for dilaudid.  Will continue to monitor.

## 2012-08-09 NOTE — Anesthesia Procedure Notes (Signed)
Procedure Name: Intubation Date/Time: 08/09/2012 8:14 AM Performed by: Darcey Nora B Pre-anesthesia Checklist: Patient identified, Emergency Drugs available, Suction available and Patient being monitored Patient Re-evaluated:Patient Re-evaluated prior to inductionOxygen Delivery Method: Circle system utilized Preoxygenation: Pre-oxygenation with 100% oxygen Intubation Type: IV induction Ventilation: Mask ventilation without difficulty Laryngoscope Size: Mac and 3 Grade View: Grade II Tube type: Oral Tube size: 7.5 mm Number of attempts: 1 Airway Equipment and Method: Stylet Placement Confirmation: ETT inserted through vocal cords under direct vision,  breath sounds checked- equal and bilateral and positive ETCO2 Secured at: 21 (cm at teeth) cm Tube secured with: Tape Dental Injury: Teeth and Oropharynx as per pre-operative assessment

## 2012-08-09 NOTE — Anesthesia Preprocedure Evaluation (Addendum)
Anesthesia Evaluation  Patient identified by MRN, date of birth, ID band Patient awake    Reviewed: Allergy & Precautions, H&P , NPO status , Patient's Chart, lab work & pertinent test results, reviewed documented beta blocker date and time   History of Anesthesia Complications (+) PONV  Airway Mallampati: I TM Distance: >3 FB Neck ROM: full    Dental  (+) Teeth Intact and Dental Advisory Given   Pulmonary shortness of breath, with exertion and at rest,          Cardiovascular hypertension, Pt. on medications Rhythm:regular Rate:Normal     Neuro/Psych  Headaches, PSYCHIATRIC DISORDERS Anxiety  Neuromuscular disease    GI/Hepatic GERD-  Medicated and Controlled,  Endo/Other    Renal/GU      Musculoskeletal   Abdominal   Peds  Hematology   Anesthesia Other Findings Small mouth.  No view past tongue.  Reproductive/Obstetrics                         Anesthesia Physical Anesthesia Plan  ASA: II  Anesthesia Plan: General   Post-op Pain Management:    Induction: Intravenous  Airway Management Planned: Oral ETT  Additional Equipment:   Intra-op Plan:   Post-operative Plan: Extubation in OR  Informed Consent: I have reviewed the patients History and Physical, chart, labs and discussed the procedure including the risks, benefits and alternatives for the proposed anesthesia with the patient or authorized representative who has indicated his/her understanding and acceptance.     Plan Discussed with: CRNA, Anesthesiologist and Surgeon  Anesthesia Plan Comments:         Anesthesia Quick Evaluation

## 2012-08-09 NOTE — Progress Notes (Signed)
Op note [phone]

## 2012-08-09 NOTE — Progress Notes (Signed)
No shoulder pain as preop, no weakness. Mild hoarseness

## 2012-08-09 NOTE — H&P (Signed)
[name] [name] is an 62 y.o. female.   Chief Complaint: neck pain HPI: patient complaining of neck pain for several months no better with conservative treatment. Previously she has had cervical and anterior fusions.  Past Medical History  Diagnosis Date  . Osteoporosis   . Varicosities     venous  . Cardiomyopathy HX --06/2010    EF was 25% during acute illness (PHELONEPHRITIS) Repeat echo 12-06-10 60% showed normal EF.   Marland Kitchen Hypertension   . Headache   . PONV (postoperative nausea and vomiting)   . Heart murmur     DENIES S & S   (ECHO JUN'12 W/ CHART)  . History of chronic bronchitis   . Chronic back pain greater than 3 months duration     S/P BACK SURG'S  . Arachnoiditis BILATERAL LEGS    DUE TO MULTIPLE BACK SURG.'S  . Weakness of both legs DUE TO ARACHNOIDITIS    OCCASIONAL USES CANE  . Blood transfusion   . GERD (gastroesophageal reflux disease) AND HIATIAL HERNIA    CONTROLLED W/ NEXIUM  . Acute sinusitis, unspecified   . Anxiety state, unspecified   . Essential hypertension, benign   . Dyslipidemia   . Other malaise and fatigue   . Murmur, heart   . Shortness of breath   . Neuromuscular disorder     numbness and tingling  . Arthritis   . Hx of bladder infections     Past Surgical History  Procedure Laterality Date  . Lumbar fusion  11-30-10    L4 - 5  . Exploration of incision for csf leak  DEC 2011  X2    POST LAMINECOTMY  . Lumbar laminectomy  DEC 2011    L4 - 5  . Lumbar fusion  2000    L2 - 4  . Cervical disc surgery  2005    C5 - 6  . Anterior fusion cervical spine  2007    C5 -6  . Tendon repair  JAN 2010    LEFT INDEX AND LONG FINGERS  . Left wrist tenosynectomy w/ left thumb joint repair  02-24-10  . Abdominal hysterectomy  1987    W/ BSO  . Cholecystectomy  1994  . Knee arthroscopy  LEFT X3 (LAST ONE 2005)  . Carpal tunnel release  RIGHT - 2001  & DEC 2011 W/ BACK SURG.  . Knee arthroscopy  05/17/2011    Procedure: ARTHROSCOPY KNEE;   Surgeon: Curlene Labrum III;  Location: [facility];  Service: Orthopedics;  Laterality: Right;  WITH MEDIAL MENISECTOMY AND removal of suprapatella fat lump    Family History  Problem Relation Age of Onset  . Hypertension Mother   . Heart disease Mother   . Diabetes Mother   . Cancer Father   . Diabetes Brother    Social History:  reports that she has never smoked. She has never used smokeless tobacco. She reports that she does not drink alcohol or use illicit drugs.  Allergies:  Allergies  Allergen Reactions  . Latex Itching and Rash  . Morphine And Related Hives and Itching  . Aspirin Nausea And Vomiting    Can take coated asa  . Biaxin (Clarithromycin)   . Clarithromycin Diarrhea  . Codeine Itching  . Darvocet (Propoxyphene-Acetaminophen) Itching  . Lortab (Hydrocodone-Acetaminophen) Itching  . Percocet (Oxycodone-Acetaminophen) Itching    Medications Prior to Admission  Medication Sig Dispense Refill  . albuterol (PROVENTIL HFA;VENTOLIN HFA) 108 (90 BASE) MCG/ACT inhaler Inhale 2  puffs into the lungs every 4 (four) hours as needed. For shortness of breath      . aspirin EC 81 MG tablet Take 81 mg by mouth daily.      . Biotin 10 MG CAPS Take 10 mg by mouth daily.      . bisoprolol-hydrochlorothiazide (ZIAC) 5-6.25 MG per tablet Take 1 tablet by mouth daily.       . Calcium Carbonate-Vit D-Min 1200-1000 MG-UNIT CHEW Chew 1 tablet by mouth 2 (two) times daily.       . cyclobenzaprine (FLEXERIL) 10 MG tablet Take 10 mg by mouth 3 (three) times daily as needed. For muscle spams. Alternates with valium 10 mg      . diazepam (VALIUM) 10 MG tablet Take 10 mg by mouth 3 (three) times daily as needed. MUSCLE SPASMS , ALTERNATES W/ FLEXERIL      . esomeprazole (NEXIUM) 40 MG capsule Take 40 mg by mouth every morning.       . estrogens, conjugated, (PREMARIN) 1.25 MG tablet Take 1.25 mg by mouth every morning.       . fish oil-omega-3 fatty acids 1000 MG capsule Take 1  g by mouth daily.      Marland Kitchen gabapentin (NEURONTIN) 600 MG tablet Take 1 tablet (600 mg total) by mouth 3 (three) times daily.  90 tablet  2  . ibandronate (BONIVA) 150 MG tablet Take 150 mg by mouth every 30 (thirty) days. Take in the morning with a full glass of water, on an empty stomach, and do not take anything else by mouth or lie down for the next 30 min.      . levalbuterol (XOPENEX HFA) 45 MCG/ACT inhaler Inhale 1-2 puffs into the lungs every 4 (four) hours as needed. For shortness of breath      . oxyCODONE (ROXICODONE) 15 MG immediate release tablet Take 1 tablet (15 mg total) by mouth every 8 (eight) hours as needed. For pain  90 tablet  0  . oxymorphone (OPANA ER) 40 MG 12 hr tablet Take 1 tablet (40 mg total) by mouth every 12 (twelve) hours.  60 tablet  0  . valACYclovir (VALTREX) 500 MG tablet Take 1,000 mg by mouth 2 (two) times daily.       . vitamin B-12 (CYANOCOBALAMIN) 1000 MCG tablet Take 1,000 mcg by mouth daily.         Results for orders placed during the hospital encounter of 08/07/12 (from the past 48 hour(s))  BASIC METABOLIC PANEL     Status: None   Collection Time    08/07/12  3:32 PM      Result Value Range   Sodium 140  135 - 145 mEq/L   Potassium 3.6  3.5 - 5.1 mEq/L   Chloride 102  96 - 112 mEq/L   CO2 30  19 - 32 mEq/L   Glucose, Bld 76  70 - 99 mg/dL   BUN 11  6 - 23 mg/dL   Creatinine, Ser 1.61  0.50 - 1.10 mg/dL   Calcium 9.0  8.4 - 09.6 mg/dL   GFR calc non Af Amer >90  >90 mL/min   GFR calc Af Amer >90  >90 mL/min   Comment:            The eGFR has been calculated     using the CKD EPI equation.     This calculation has not been     validated in all clinical     situations.  eGFR's persistently     <90 mL/min signify     possible Chronic Kidney Disease.  CBC     Status: None   Collection Time    08/07/12  3:32 PM      Result Value Range   WBC 7.2  4.0 - 10.5 K/uL   RBC 4.03  3.87 - 5.11 MIL/uL   Hemoglobin 13.1  12.0 - 15.0 g/dL   HCT  40.9  81.1 - 91.4 %   MCV 96.5  78.0 - 100.0 fL   MCH 32.5  26.0 - 34.0 pg   MCHC 33.7  30.0 - 36.0 g/dL   RDW 78.2  95.6 - 21.3 %   Platelets 188  150 - 400 K/uL  SURGICAL PCR SCREEN     Status: Abnormal   Collection Time    08/07/12  3:37 PM      Result Value Range   MRSA, PCR NEGATIVE  NEGATIVE   Staphylococcus aureus POSITIVE (*) NEGATIVE   Comment:            The Xpert SA Assay (FDA     approved for NASAL specimens     in patients over 24 years of age),     is one component of     a comprehensive surveillance     program.  Test performance has     been validated by [employer] for patients greater     than or equal to 57 year old.     It is not intended     to diagnose infection nor to     guide or monitor treatment.   Dg Chest 2 View  08/09/2012  *RADIOLOGY REPORT*  Clinical Data: Preoperative radiograph  CHEST - 2 VIEW  Comparison: 07/19/2012 CT  Findings: Hypoaeration.  No focal consolidation, pleural effusion, or pneumothorax.  Cardiomediastinal contours within normal range. Tiny nodules noted on recent CT are below the resolution for radiograph.  Partially imaged lumbar fusion hardware. L1 vertebroplasty. Mild superior endplate height loss at T6.  No acute osseous finding.  IMPRESSION: No acute process identified.   Original Report Authenticated By: Jearld Lesch, M.D.     Review of Systems  Constitutional: Negative.   HENT: Positive for neck pain.   Respiratory: Positive for shortness of breath.   Cardiovascular: Negative.   Gastrointestinal: Negative.   Genitourinary: Negative.   Musculoskeletal: Positive for back pain.  Skin: Negative.   Neurological: Positive for sensory change and focal weakness.  Endo/Heme/Allergies: Negative.   Psychiatric/Behavioral: Negative.     Blood pressure 117/75, pulse 81, temperature 98.2 F (36.8 C), temperature source Oral, resp. rate 20, SpO2 97.00%. Physical Exam hent, nl. Neck, pain to move. Anterior and posterios  scars. Cv, nl. Lungs rales bilaterally. Abdomen, soft. Extremities, nl. NEURO WEAKNESS AND TENDERNESS at deltoids level.   Assessment/Plan cervical 45 ddd , stenosis. Recommendation acd at c45 . Aware of risks and benefits. To or  Winnie Barsky M 08/09/2012, 7:53 AM

## 2012-08-10 LAB — URINALYSIS, ROUTINE W REFLEX MICROSCOPIC
Glucose, UA: 500 mg/dL — AB
Ketones, ur: NEGATIVE mg/dL
Protein, ur: NEGATIVE mg/dL
Urobilinogen, UA: 0.2 mg/dL (ref 0.0–1.0)

## 2012-08-10 LAB — URINE MICROSCOPIC-ADD ON

## 2012-08-10 NOTE — Plan of Care (Signed)
Problem: Consults Goal: Diagnosis - Spinal Surgery Cervical Spine Fusion     

## 2012-08-10 NOTE — Op Note (Signed)
NAMENAMIKO, PRITTS                ACCOUNT NO.:  [id number]  MEDICAL RECORD NO.:  [id number]  LOCATION:  4N22C                        FACILITY:  [facility]  PHYSICIAN:  Hilda Lias, M.D.   DATE OF BIRTH:  12/22/1950  DATE OF PROCEDURE:  08/09/2012 DATE OF DISCHARGE:                              OPERATIVE REPORT   PREOPERATIVE DIAGNOSES:  C4-C5 spondylosis with chronic radiculopathy, status post anterior and posterior fusion at the level of L5-L6.  Lumbar fusion.  Lumbar kyphoplasty.  POSTOPERATIVE DIAGNOSIS:  C4-C5 spondylosis with chronic radiculopathy, status post anterior and posterior fusion at the level of L5-L6.  Lumbar fusion.  Lumbar kyphoplasty.  PROCEDURE:  Anterior C4-C5 discectomy, decompression of the spinal cord, bilateral foraminotomy, interbody fusion with cage, plate microscope.  SURGEON:  Hilda Lias, M.D.  ASSISTANT:  Dr. Lovell Sheehan.  CLINICAL HISTORY:  Ms. Gamel is a lady who has history of osteoporosis, who in the past underwent anterior and posterior fusion of L5-6.  Also she had lumbar interventions.  Lately, she has been complaining of neck pain radiating to both shoulders.  X-ray showed stenosis with spondylosis at the L4-L5.  Surgery was advised and the risk was fully explained to her.  DESCRIPTION OF PROCEDURE:  The patient was taken to the OR, and after intubation, the left side of the neck was cleaned with DuraPrep.  The incision was made through the skin, subcutaneous tissue, platysma, straight to the cervical area.  We found immediately the plate and x-ray showed that indeed we were right at the L4-L5.  We had to drill the anterior lip of the C4 to be able to get into the disc space.  The disc was quite degenerated, dry.  We drilled all the way posteriorly using the 1 and 2 mm Kerrison punch.  We removed the posterior ligament with decompression of the spinal cord as well as both C5 nerve roots.  Then, we were able to introduce the cage of 7 mm  height, lordotic with autograft bone extender inside.  This was followed with the plate using 4 screws.  Lateral cervical spine x-ray showed good position of the plate and the cages.  Then, the area was irrigated.  Once we accomplished hemostasis, the wound was closed with Vicryl and Steri- Strips.          ______________________________ Hilda Lias, M.D.     EB/MEDQ  D:  08/09/2012  T:  08/09/2012  Job:  [id number]

## 2012-08-10 NOTE — Progress Notes (Signed)
Subjective: Patient reports Patient [name] [name] okay still having a fair amount of right shoulder pain down her elbow pain is not well controlled yet she is still very slow to mobilize her she will need an additional day  Objective: Vital signs in last 24 hours: Temp:  [97.7 F (36.5 C)-99.6 F (37.6 C)] 97.7 F (36.5 C) (02/22 0643) Pulse Rate:  [69-99] 87 (02/22 0643) Resp:  [11-20] 18 (02/22 0643) BP: (88-135)/(34-69) 94/55 mmHg (02/22 0643) SpO2:  [94 %-100 %] 96 % (02/22 0643) Weight:  [73 kg (160 lb 15 oz)] 73 kg (160 lb 15 oz) (02/21 1120)  Intake/Output from previous day: 02/21 0701 - 02/22 0700 In: 1650 [I.V.:1650] Out: 100 [Blood:100] Intake/Output this shift: Total I/O In: 240 [P.O.:240] Out: -   Strength is 5 out of 5 wound is clean and dry  Lab Results:  Recent Labs  08/07/12 1532  WBC 7.2  HGB 13.1  HCT 38.9  PLT 188   BMET  Recent Labs  08/07/12 1532  NA 140  K 3.6  CL 102  CO2 30  GLUCOSE 76  BUN 11  CREATININE 0.63  CALCIUM 9.0    Studies/Results: Dg Chest 2 View  08/09/2012  *RADIOLOGY REPORT*  Clinical Data: Preoperative radiograph  CHEST - 2 VIEW  Comparison: 07/19/2012 CT  Findings: Hypoaeration.  No focal consolidation, pleural effusion, or pneumothorax.  Cardiomediastinal contours within normal range. Tiny nodules noted on recent CT are below the resolution for radiograph.  Partially imaged lumbar fusion hardware. L1 vertebroplasty. Mild superior endplate height loss at T6.  No acute osseous finding.  IMPRESSION: No acute process identified.   Original Report Authenticated By: Jearld Lesch, M.D.    Dg Cervical Spine 1 View  08/09/2012  *RADIOLOGY REPORT*  Clinical Data: Instrument localization  DG CERVICAL SPINE - 1 VIEW  Comparison: Multiple priors  Findings: Cross-table lateral film #1 demonstrates a needle at C4-5 from an anterior approach.  Prior C5-6 fusion.  IMPRESSION: As above.   Original Report Authenticated By: Davonna Belling, M.D.     Dg Cervical Spine 2-3 Views  08/09/2012  *RADIOLOGY REPORT*  Clinical Data: Neck pain  DG C-ARM 1-60 MIN,CERVICAL SPINE - 2-3 VIEW  Comparison: Multiple priors  Findings: C-arm films document C4-5 ACDF.  IMPRESSION: Satisfactory position and alignment.   Original Report Authenticated By: Davonna Belling, M.D.    Dg C-arm 1-60 Min  08/09/2012  *RADIOLOGY REPORT*  Clinical Data: Neck pain  DG C-ARM 1-60 MIN,CERVICAL SPINE - 2-3 VIEW  Comparison: Multiple priors  Findings: C-arm films document C4-5 ACDF.  IMPRESSION: Satisfactory position and alignment.   Original Report Authenticated By: Davonna Belling, M.D.     Assessment/Plan: Continue mobilization today work on pain management probable discharge tomorrow  LOS: 1 day     Jionni Helming P 08/10/2012, 8:44 AM

## 2012-08-11 LAB — URINE CULTURE
Colony Count: NO GROWTH
Culture: NO GROWTH

## 2012-08-11 MED ORDER — OXYCODONE HCL 15 MG PO TABS: 15.0000 mg | ORAL_TABLET | ORAL | Status: DC | PRN

## 2012-08-11 MED ORDER — DIAZEPAM 5 MG PO TABS: 5.0000 mg | ORAL_TABLET | Freq: Four times a day (QID) | ORAL | Status: DC | PRN

## 2012-08-11 MED ORDER — OXYMORPHONE HCL ER 40 MG PO TB12: 40.0000 mg | ORAL_TABLET | Freq: Two times a day (BID) | ORAL | Status: DC

## 2012-08-11 NOTE — Discharge Summary (Signed)
Physician Discharge Summary  Patient ID: [name] [name] MRN: [id number] DOB/AGE: 60-Mar-1952 60 y.o.  Admit date: 08/09/2012 Discharge date: 08/11/2012  Admission Diagnoses: C4-5 disc degeneration, cervicalgia, cervical radiculopathy  Discharge Diagnoses: The same Active Problems:   * No active hospital problems. *   Discharged Condition: good  Hospital Course: Dr. Jeral [name] admitted the patient to [facility] on 08/09/2012. On that day performed a C4-5 intracervical discectomy, fusion, and plating.  [name] postoperative course was unremarkable. On postop day #2 she requested discharge to home. Patient has taken Roxicodone and Opana ERthe long-term and requested these upon discharge. She is seen in the pain clinic. The patient was given oral and written discharge instructions. All her questions were answered.  Consults: None Significant Diagnostic Studies: None Treatments: C4-5 anterior cervical discectomy, fusion, and plating. Discharge Exam: Blood pressure 133/64, pulse 97, temperature 97.8 F (36.6 C), temperature source Oral, resp. rate 20, height 5\' 5"  (1.651 m), weight 73 kg (160 lb 15 oz), SpO2 96.00%. The patient is alert and pleasant. She looks well. Her dressing is clean and dry. There is no evidence of hematoma or shift. She is moving all 4 extremities well.  Disposition: Home  Discharge Orders   Future Orders Complete By Expires     Call MD for:  difficulty breathing, headache or visual disturbances  As directed     Call MD for:  extreme fatigue  As directed     Call MD for:  hives  As directed     Call MD for:  persistant dizziness or light-headedness  As directed     Call MD for:  persistant nausea and vomiting  As directed     Call MD for:  redness, tenderness, or signs of infection (pain, swelling, redness, odor or green/yellow discharge around incision site)  As directed     Call MD for:  severe uncontrolled pain  As directed     Call MD for:   temperature >100.4  As directed     Diet - low sodium heart healthy  As directed     Discharge instructions  As directed     Comments:      Call [phone] for a followup appointment.    Driving Restrictions  As directed     Comments:      Do not drive for 2 weeks.    Increase activity slowly  As directed     Lifting restrictions  As directed     Comments:      Do not lift more than 5 pounds. No excessive bending or twisting.    May shower / Bathe  As directed     Comments:      He may shower after the pain she is removed 3 days after surgery. Leave the incision alone.    Remove dressing in 48 hours  As directed     Comments:      Your stitches are under the scan and will dissolve by themselves. The Steri-Strips will fall off after you take a few showers. Do not rub back or pick at the wound, Leave the wound alone.        Medication List    STOP taking these medications       cyclobenzaprine 10 MG tablet  Commonly known as:  FLEXERIL      TAKE these medications       albuterol 108 (90 BASE) MCG/ACT inhaler  Commonly known as:  PROVENTIL HFA;VENTOLIN HFA  Inhale  2 puffs into the lungs every 4 (four) hours as needed. For shortness of breath     aspirin EC 81 MG tablet  Take 81 mg by mouth daily.     Biotin 10 MG Caps  Take 10 mg by mouth daily.     bisoprolol-hydrochlorothiazide 5-6.25 MG per tablet  Commonly known as:  ZIAC  Take 1 tablet by mouth daily.     BONIVA 150 MG tablet  Generic drug:  ibandronate  Take 150 mg by mouth every 30 (thirty) days. Take in the morning with a full glass of water, on an empty stomach, and do not take anything else by mouth or lie down for the next 30 min.     Calcium Carbonate-Vit D-Min 1200-1000 MG-UNIT Chew  Chew 1 tablet by mouth 2 (two) times daily.     diazepam 10 MG tablet  Commonly known as:  VALIUM  Take 10 mg by mouth 3 (three) times daily as needed. MUSCLE SPASMS , ALTERNATES W/ FLEXERIL     diazepam 5 MG tablet   Commonly known as:  VALIUM  Take 1 tablet (5 mg total) by mouth every 6 (six) hours as needed.     esomeprazole 40 MG capsule  Commonly known as:  NEXIUM  Take 40 mg by mouth every morning.     estrogens (conjugated) 1.25 MG tablet  Commonly known as:  PREMARIN  Take 1.25 mg by mouth every morning.     fish oil-omega-3 fatty acids 1000 MG capsule  Take 1 g by mouth daily.     gabapentin 600 MG tablet  Commonly known as:  NEURONTIN  Take 1 tablet (600 mg total) by mouth 3 (three) times daily.     levalbuterol 45 MCG/ACT inhaler  Commonly known as:  XOPENEX HFA  Inhale 1-2 puffs into the lungs every 4 (four) hours as needed. For shortness of breath     oxyCODONE 15 MG immediate release tablet  Commonly known as:  ROXICODONE  Take 1 tablet (15 mg total) by mouth every 8 (eight) hours as needed. For pain     oxyCODONE 15 MG immediate release tablet  Commonly known as:  ROXICODONE  Take 1 tablet (15 mg total) by mouth every 4 (four) hours as needed for pain.     oxymorphone 40 MG 12 hr tablet  Commonly known as:  OPANA ER  Take 1 tablet (40 mg total) by mouth every 12 (twelve) hours.     oxymorphone 40 MG 12 hr tablet  Commonly known as:  OPANA ER  Take 1 tablet (40 mg total) by mouth every 12 (twelve) hours.     valACYclovir 500 MG tablet  Commonly known as:  VALTREX  Take 1,000 mg by mouth 2 (two) times daily.     vitamin B-12 1000 MCG tablet  Commonly known as:  CYANOCOBALAMIN  Take 1,000 mcg by mouth daily.         SignedCristi [name] 08/11/2012, 10:35 AM

## 2012-08-12 ENCOUNTER — Encounter (HOSPITAL_COMMUNITY): Payer: Self-pay | Admitting: Neurosurgery

## 2012-08-12 ENCOUNTER — Telehealth: Payer: Self-pay | Admitting: *Deleted

## 2012-08-12 NOTE — Telephone Encounter (Signed)
FYI.  Had Neck surgery with Dr Jeral Fruit on Friday.  He kept her on Opana ER 40 mg bid but changed her Roxicodone 15 mg to q 4 hours prn # 100

## 2012-08-12 NOTE — Telephone Encounter (Signed)
So noted. Will address at follow up visit here. thanks

## 2012-08-15 ENCOUNTER — Telehealth: Payer: Self-pay | Admitting: Internal Medicine

## 2012-08-15 NOTE — Telephone Encounter (Signed)
Pt had MCT on 08-07-12, I think this is in your look-at paperwork. Please advise. Carron Curie, CMA

## 2012-08-16 NOTE — Telephone Encounter (Signed)
lmomtcb to discuss results with pt.

## 2012-08-16 NOTE — Telephone Encounter (Signed)
Methacholine challenge test on 08/07/2012 is positive for asthma. PD 20 at less than 4.0. No flow volume loop issues  Please have her come in to the office and start Symbicort 80/4.5 mcg 2 puff twice daily and return to see me in 1-2 months. It'll be good for her to start this before her surgery if possible

## 2012-08-19 MED ORDER — BUDESONIDE-FORMOTEROL FUMARATE 80-4.5 MCG/ACT IN AERO: 2.0000 | INHALATION_SPRAY | Freq: Two times a day (BID) | RESPIRATORY_TRACT | Status: DC

## 2012-08-19 NOTE — Telephone Encounter (Signed)
Pt returned call.  Call her back @ [phone]. Leanora Ivanoff

## 2012-08-19 NOTE — Telephone Encounter (Signed)
Called, spoke with pt.  I informed her of MCT results and recs per MR.  Pt states she had surgery on Feb 21.  She is aware to come by office to pick up a sample of symbicort 80 and use 2 puffs bid.  I asked she pls ask someone to show her how to use this when she picks up sample.  We have scheduled her to see MR on April 8 at 2:15 -- pt aware and verbalized understanding of instructions.  I have placed a note on the sample that pt will need inhaler teaching.   Pt states she is already on Albuterol HFA and Xopenex HFA by PCP to use prn.  MR, pls advise if pt should be on both of these.  Thank you.  **Prior to signing off on msg, pt would like this sent to her PCP, Dr. Olena Leatherwood.

## 2012-08-20 MED ORDER — BUDESONIDE-FORMOTEROL FUMARATE 80-4.5 MCG/ACT IN AERO: 2.0000 | INHALATION_SPRAY | Freq: Two times a day (BID) | RESPIRATORY_TRACT | Status: DC

## 2012-08-20 NOTE — Telephone Encounter (Signed)
Called, spoke with pt.  I informed her of below per MR.  She verbalized understanding of this.  Pt would like symbicort rx sent to [facility].  She would like to start the medication but doesn't want to come to [place] until next week when she is scheduled to come for a dr appt.  Pt states she is aware on how to use the inhaler and will call back if she has any questions or problems with this medication.  She will also come by next week for a sample and will ask for inhaler teaching if she is having any problems with this during that time.  I have sent symbicort rx to pharm -- pt aware.  She is also aware this note has been faxed to PCP per her request.  She voiced no further questions or concerns at this time.

## 2012-08-20 NOTE — Telephone Encounter (Signed)
Use only albuter or xopenex prn. Not both

## 2012-08-22 DIAGNOSIS — M47812 Spondylosis without myelopathy or radiculopathy, cervical region: Secondary | ICD-10-CM | POA: Diagnosis not present

## 2012-09-19 DIAGNOSIS — M47812 Spondylosis without myelopathy or radiculopathy, cervical region: Secondary | ICD-10-CM | POA: Diagnosis not present

## 2012-09-24 ENCOUNTER — Ambulatory Visit (INDEPENDENT_AMBULATORY_CARE_PROVIDER_SITE_OTHER): Payer: Medicare Other | Admitting: Internal Medicine

## 2012-09-24 ENCOUNTER — Encounter: Payer: Self-pay | Admitting: Internal Medicine

## 2012-09-24 VITALS — BP 144/78 | HR 64 | Temp 97.7°F | Ht 65.0 in | Wt 163.0 lb

## 2012-09-24 DIAGNOSIS — J45909 Unspecified asthma, uncomplicated: Secondary | ICD-10-CM

## 2012-09-24 DIAGNOSIS — R911 Solitary pulmonary nodule: Secondary | ICD-10-CM | POA: Diagnosis not present

## 2012-09-24 DIAGNOSIS — J4530 Mild persistent asthma, uncomplicated: Secondary | ICD-10-CM

## 2012-09-24 NOTE — Patient Instructions (Addendum)
#  Asthma - Glad you're feeling better. Currently stable disease Continue Symbicort 2 puff twice daily - Rinse mouth after use to prevent thrush - Cut down environmental exposures that make asthma worse  - These include absolutely no smoking by anybody inside the house or inside the car even when you're not in the place  - Use high-grade air filter for the house every 3 months  - Get duct system cleaned   #Lung nodules  -= 3 mm nodules are very low risk for lung cancer  - However continued smoking even passively will place you at risk for future lung cancer - You need followup CT scan of the chest in February 2015  #Followup - 9 months or sooner if needed - Please have flu shot in August/September 2014

## 2012-09-24 NOTE — Assessment & Plan Note (Signed)
#  Asthma - Glad you're feeling better. Currently stable disease Continue Symbicort 2 puff twice daily - Rinse mouth after use to prevent thrush - Cut down environmental exposures that make asthma worse  - These include absolutely no smoking by anybody inside the house or inside the car even when you're not in the place  - Use high-grade air filter for the house every 3 months  - Get duct system cleaned   #Followup - 9 months or sooner if needed - Please have flu shot in August/September 2014

## 2012-09-24 NOTE — Progress Notes (Signed)
Subjective:    Patient ID: [name] [name], female    DOB: Jun 11, 1951, 62 y.o.   MRN: [id number]  HPI  Subjective:    Patient ID: [name] [name], female    DOB: 1951-05-01, 62 y.o.   MRN: [id number]  HPI  PCP is HASANAJ,XAJE A, MD Cards is dr Daiva Nakayama  Body mass index is 26.83 kg/(m^2). Passive smoker x 17 years   IOV 04/22/2012 62 year old female. Referred for dyspnea. Wondering if she has passive smoking related lung disease (husband and son)  Dyspnea: In 2011 December was critically ill following spine surgery, had UTI and low EF but since then has recovered from critical illness. Says she has been dyspneic even before this critical illness for total 3-4 years but since past 1 year more noticeable dyspnea. Sometimes dyspneic walking from room to room. Sometimes dyspneic talking on phone. Triggers include cig smoke  Of husband ("husband's cig smoke is killing me and they think I am crazy") and is associated with cough at that time. Dyspnea is slowly progressive. Severity is mild-moderate. No orthopnea. No paroxysmal nocturnal dyspnea but has woken up wheezing at night since sept 2013 (was exposed to perfume at doctor office and dx acute bronchitis and given abx). THere is associated cough but only episodic.  There is no edema, hemoptysis, unintenional weight loss.   Walking desaturation test on 04/23/2012 185 feet x 3 laps:  did NOT desaturate. Rest pulse ox was 97%, final pulse ox was 94%. HR response 84/min at rest to 86/min at peak exertion.   RElevant hx   Fused L2-L5 x 3 spine surgery due to disc disease, osteoporosis, and also fall, various reasons  LABS - cxr sept 2012: clear lung fields - hgb nov 2012: 11.7gm%  - cardiac 04/01/12: Normal stress test with normal EF - no hx of PFTs  REC #Shortness of breath  - our check list says latex allergy is cautionary for flu shot; do not know why but we will defer flu shot today  - CMA/RN will walk you for oxygen levels now  04/22/2012  - please have full PFT breathing test at  or [place] or [place] at your convenience; soon after doing it call us at [phone] and pass message on so I can review and advise next step      Telephone calls Dec - FEb 2014 PFT 05/27/12 is normal ecwpt mild reduction in diffusion (74%) and 8% BD response. These could be freatures of early emphysema, asthma, or scarring in lungs or normal variant. To better understand this - do HRCT chest. IF this is normal, needs methacholine challenge test for asthma and if that too is normal needs bike pulmonary stress test. She is aware of this algorithm. I will stay in touch by phone. For now, get CT chest (ordered)  CT chest does not show reason for shortness of breath. Next to do is methacholine challenge test; ordered. She is do and give [place] a call so I can review it. She know the algorithm. CT does show 3mm lung nodules -very very very rare < 1:1000 chance it is serious like cancer. She needs repeat ct chest in 1 year without cotnrast (ordered)  Methacholine challenge test on 08/07/2012 is positive for asthma. PD 20 at less than 4.0. No flow volume loop issues   OV 09/24/2012  Followup 3 mm lung nodule and Followup for dyspnea on the basis of positive methacholine challenge test   At the end of last  visit she had diagnostic workup. She has been diagnosed with 3 mm lung nodule and also positive methacholine challenge test. For the past few weeks she is taking Symbicort 2 puff twice daily. This is helping with her symptoms. She is absolutely petrified about developing lung cancer. She is upset that her husband and son continued to smoke and had a house and this is an etiological agent for lung cancer. She is also worried that this will make her asthma worse. I have agreed with her sentiments. There are no new issues   Past, Family, Social reviewed: no change since last visit other than the fact she had disc fusion surgery by Dr.  Jeral Fruit       Review of Systems  Constitutional: Negative for fever and unexpected weight change.  HENT: Negative for ear pain, nosebleeds, congestion, sore throat, rhinorrhea, sneezing, trouble swallowing, dental problem, postnasal drip and sinus pressure.   Eyes: Negative for redness and itching.  Respiratory: Positive for shortness of breath. Negative for cough, chest tightness and wheezing.   Cardiovascular: Negative for palpitations and leg swelling.  Gastrointestinal: Negative for nausea and vomiting.  Genitourinary: Negative for dysuria.  Musculoskeletal: Negative for joint swelling.  Skin: Negative for rash.  Allergic/Immunologic: Positive for environmental allergies.  Neurological: Negative for headaches.  Hematological: Does not bruise/bleed easily.  Psychiatric/Behavioral: Negative for dysphoric mood. The patient is not nervous/anxious.    Current outpatient prescriptions:albuterol (PROVENTIL HFA;VENTOLIN HFA) 108 (90 BASE) MCG/ACT inhaler, Inhale 2 puffs into the lungs every 4 (four) hours as needed. For shortness of breath, Disp: , Rfl: ;  aspirin EC 81 MG tablet, Take 81 mg by mouth daily., Disp: , Rfl: ;  Biotin 10 MG CAPS, Take 10 mg by mouth daily., Disp: , Rfl: ;  bisoprolol-hydrochlorothiazide (ZIAC) 5-6.25 MG per tablet, Take 1 tablet by mouth daily. , Disp: , Rfl:  budesonide-formoterol (SYMBICORT) 80-4.5 MCG/ACT inhaler, Inhale 2 puffs into the lungs 2 (two) times daily., Disp: 1 Inhaler, Rfl: 0;  budesonide-formoterol (SYMBICORT) 80-4.5 MCG/ACT inhaler, Inhale 2 puffs into the lungs 2 (two) times daily., Disp: 1 Inhaler, Rfl: 1;  Calcium Carbonate-Vit D-Min 1200-1000 MG-UNIT CHEW, Chew 1 tablet by mouth 2 (two) times daily. , Disp: , Rfl:  diazepam (VALIUM) 10 MG tablet, Take 10 mg by mouth 3 (three) times daily as needed. MUSCLE SPASMS , ALTERNATES W/ FLEXERIL, Disp: , Rfl: ;  esomeprazole (NEXIUM) 40 MG capsule, Take 40 mg by mouth every morning. , Disp: , Rfl: ;   estrogens, conjugated, (PREMARIN) 1.25 MG tablet, Take 1.25 mg by mouth every morning. , Disp: , Rfl: ;  fish oil-omega-3 fatty acids 1000 MG capsule, Take 1 g by mouth daily., Disp: , Rfl:  gabapentin (NEURONTIN) 600 MG tablet, Take 1 tablet (600 mg total) by mouth 3 (three) times daily., Disp: 90 tablet, Rfl: 2;  ibandronate (BONIVA) 150 MG tablet, Take 150 mg by mouth every 30 (thirty) days. Take in the morning with a full glass of water, on an empty stomach, and do not take anything else by mouth or lie down for the next 30 min., Disp: , Rfl:  levalbuterol (XOPENEX HFA) 45 MCG/ACT inhaler, Inhale 1-2 puffs into the lungs every 4 (four) hours as needed. For shortness of breath, Disp: , Rfl: ;  oxyCODONE (ROXICODONE) 15 MG immediate release tablet, Take 1 tablet (15 mg total) by mouth every 8 (eight) hours as needed. For pain, Disp: 90 tablet, Rfl: 0;  oxymorphone (OPANA ER) 40 MG 12 hr tablet,  Take 1 tablet (40 mg total) by mouth every 12 (twelve) hours., Disp: 60 tablet, Rfl: 0 valACYclovir (VALTREX) 500 MG tablet, Take 1,000 mg by mouth daily. , Disp: , Rfl: ;  vitamin B-12 (CYANOCOBALAMIN) 1000 MCG tablet, Take 1,000 mcg by mouth daily. , Disp: , Rfl:      Objective:   Physical Exam   Objective:   Physical Exam  Vitals reviewed. Constitutional: She is oriented to person, place, and time. She appears well-developed and well-nourished. No distress.  HENT: She has a cervical collar on Head: Normocephalic and atraumatic.  Right Ear: External ear normal.  Left Ear: External ear normal.  Mouth/Throat: Oropharynx is clear and moist. No oropharyngeal exudate.  Eyes: Conjunctivae normal and EOM are normal. Pupils are equal, round, and reactive to light. Right eye exhibits no discharge. Left eye exhibits no discharge. No scleral icterus.  Neck: Normal range of motion. Neck supple. No JVD present. No tracheal deviation present. No thyromegaly present.  Cardiovascular: Normal rate, regular rhythm,  normal heart sounds and intact distal pulses.  Exam reveals no gallop and no friction rub.   No murmur heard. Pulmonary/Chest: Effort normal. No respiratory distress. Wheezing at prior visit has resolved  Abdominal: Soft. Bowel sounds are normal. She exhibits no distension and no mass. There is no tenderness. There is no rebound and no guarding.  Musculoskeletal: Normal range of motion. She exhibits no edema and no tenderness.  Lymphadenopathy:    She has no cervical adenopathy.  Neurological: She is alert and oriented to person, place, and time. She has normal reflexes. No cranial nerve deficit. She exhibits normal muscle tone. Coordination normal.  Skin: Skin is warm and dry. No rash noted. She is not diaphoretic. No erythema. No pallor.  Psychiatric: She has a normal mood and affect. Her behavior is normal. Judgment and thought content normal.         Assessment & Plan:

## 2012-09-24 NOTE — Assessment & Plan Note (Signed)
#  Lung nodules  -= 3 mm nodules are very low risk for lung cancer  - However continued smoking even passively will place you at risk for future lung cancer - You need followup CT scan of the chest in February 2015

## 2012-10-10 ENCOUNTER — Encounter: Payer: Self-pay | Admitting: Physical Medicine and Rehabilitation

## 2012-10-11 ENCOUNTER — Telehealth: Payer: Self-pay

## 2012-10-11 NOTE — Telephone Encounter (Signed)
If we prescribed it before, we can continue if she still needs it , the surgery should have helped with her symptoms, she would have to come in.

## 2012-10-11 NOTE — Telephone Encounter (Signed)
Patient says Dr Jeral Fruit has released her for medication therapy because he will not fill opana.  Please advise.

## 2012-10-15 NOTE — Telephone Encounter (Signed)
Patient advised to make appointment.  She has been released from medication therapy by Dr Jeral Fruit.  She has had to change pharmacies due to a shortage.

## 2012-10-15 NOTE — Telephone Encounter (Signed)
Left message for patient to schedule an appointment for medications.

## 2012-10-21 ENCOUNTER — Encounter: Payer: Self-pay | Admitting: Physical Medicine and Rehabilitation

## 2012-10-21 ENCOUNTER — Ambulatory Visit (INDEPENDENT_AMBULATORY_CARE_PROVIDER_SITE_OTHER): Payer: Medicare Other | Admitting: Adult Health

## 2012-10-21 ENCOUNTER — Encounter
Payer: Medicare Other | Attending: Physical Medicine and Rehabilitation | Admitting: Physical Medicine and Rehabilitation

## 2012-10-21 ENCOUNTER — Encounter: Payer: Self-pay | Admitting: Adult Health

## 2012-10-21 VITALS — BP 124/82 | HR 96 | Temp 99.3°F | Ht 65.0 in | Wt 164.8 lb

## 2012-10-21 VITALS — BP 132/44 | HR 93 | Resp 14 | Ht 65.0 in | Wt 165.0 lb

## 2012-10-21 DIAGNOSIS — F411 Generalized anxiety disorder: Secondary | ICD-10-CM | POA: Diagnosis not present

## 2012-10-21 DIAGNOSIS — J45909 Unspecified asthma, uncomplicated: Secondary | ICD-10-CM

## 2012-10-21 DIAGNOSIS — M961 Postlaminectomy syndrome, not elsewhere classified: Secondary | ICD-10-CM | POA: Diagnosis not present

## 2012-10-21 DIAGNOSIS — Z79899 Other long term (current) drug therapy: Secondary | ICD-10-CM

## 2012-10-21 DIAGNOSIS — Z981 Arthrodesis status: Secondary | ICD-10-CM | POA: Insufficient documentation

## 2012-10-21 DIAGNOSIS — Z5181 Encounter for therapeutic drug level monitoring: Secondary | ICD-10-CM

## 2012-10-21 DIAGNOSIS — F329 Major depressive disorder, single episode, unspecified: Secondary | ICD-10-CM | POA: Insufficient documentation

## 2012-10-21 DIAGNOSIS — M25529 Pain in unspecified elbow: Secondary | ICD-10-CM | POA: Diagnosis not present

## 2012-10-21 DIAGNOSIS — J4530 Mild persistent asthma, uncomplicated: Secondary | ICD-10-CM

## 2012-10-21 DIAGNOSIS — G8929 Other chronic pain: Secondary | ICD-10-CM | POA: Insufficient documentation

## 2012-10-21 DIAGNOSIS — G031 Chronic meningitis: Secondary | ICD-10-CM | POA: Diagnosis not present

## 2012-10-21 DIAGNOSIS — M4802 Spinal stenosis, cervical region: Secondary | ICD-10-CM | POA: Diagnosis not present

## 2012-10-21 DIAGNOSIS — F3289 Other specified depressive episodes: Secondary | ICD-10-CM | POA: Insufficient documentation

## 2012-10-21 MED ORDER — CEFDINIR 300 MG PO CAPS: 300.0000 mg | ORAL_CAPSULE | Freq: Two times a day (BID) | ORAL | Status: DC

## 2012-10-21 MED ORDER — OXYCODONE HCL 15 MG PO TABS: 15.0000 mg | ORAL_TABLET | Freq: Three times a day (TID) | ORAL | Status: DC | PRN

## 2012-10-21 MED ORDER — OXYMORPHONE HCL ER 40 MG PO TB12: 40.0000 mg | ORAL_TABLET | Freq: Two times a day (BID) | ORAL | Status: DC

## 2012-10-21 NOTE — Addendum Note (Signed)
Addended by: Doreene Eland on: 10/21/2012 02:01 PM   Modules accepted: Orders

## 2012-10-21 NOTE — Progress Notes (Signed)
Subjective:    Patient ID: [name] [name], female    DOB: [birth year], 62 y.o.   MRN: [id number]  HPI The patient complains about chronic neck pain which radiates into back of her head and into her posterior right arm. The patient also complains about intermittend numbness and tingling between her shoulder blades. These symptoms had resolved after surgery, but they are starting to slowly come back since last month.  She had a MRI, which showed multifactorial stenosis at C4-5 above her fusion C5-6. She states, that she had surgery on 08/09/2012, ACDF C4-5, by Dr. Jeral Fruit.     Pain Inventory Average Pain 7 Pain Right Now 9 My pain is sharp, burning, tingling and aching  In the last 24 hours, has pain interfered with the following? General activity 8 Relation with others 8 Enjoyment of life 8 What TIME of day is your pain at its worst? all Sleep (in general) Poor  Pain is worse with: walking, bending, sitting, standing and some activites Pain improves with: rest, heat/ice and medication Relief from Meds: 7  Mobility how many minutes can you walk? 10 ability to climb steps?  yes do you drive?  yes  Function disabled: date disabled . I need assistance with the following:  household duties and shopping  Neuro/Psych trouble walking  Prior Studies Any changes since last visit?  no  Physicians involved in your care Any changes since last visit?  no   Family History  Problem Relation Age of Onset  . Hypertension Mother   . Heart disease Mother   . Diabetes Mother   . Cancer Father   . Diabetes Brother    History   Social History  . Marital Status: Legally Separated    Spouse Name: N/A    Number of Children: N/A  . Years of Education: N/A   Occupational History  . disable    Social History Main Topics  . Smoking status: Never Smoker   . Smokeless tobacco: Never Used  . Alcohol Use: No  . Drug Use: No  . Sexually Active: None   Other Topics Concern  . None    Social History Narrative   Has one child   Disabled   Past Surgical History  Procedure Laterality Date  . Lumbar fusion  11-30-10    L4 - 5  . Exploration of incision for csf leak  DEC 2011  X2    POST LAMINECOTMY  . Lumbar laminectomy  DEC 2011    L4 - 5  . Lumbar fusion  2000    L2 - 4  . Cervical disc surgery  2005    C5 - 6  . Anterior fusion cervical spine  2007    C5 -6  . Tendon repair  JAN 2010    LEFT INDEX AND LONG FINGERS  . Left wrist tenosynectomy w/ left thumb joint repair  02-24-10  . Abdominal hysterectomy  1987    W/ BSO  . Cholecystectomy  1994  . Knee arthroscopy  LEFT X3 (LAST ONE 2005)  . Carpal tunnel release  RIGHT - 2001  & DEC 2011 W/ BACK SURG.  . Knee arthroscopy  05/17/2011    Procedure: ARTHROSCOPY KNEE;  Surgeon: Curlene Labrum III;  Location: [facility];  Service: Orthopedics;  Laterality: Right;  WITH MEDIAL MENISECTOMY AND removal of suprapatella fat lump  . Anterior cervical decomp/discectomy fusion N/A 08/09/2012    Procedure: ANTERIOR CERVICAL DECOMPRESSION/DISCECTOMY FUSION 1 LEVEL;  Surgeon: Tanya Nones  Jeral Fruit, MD;  Location: [facility];  Service: Neurosurgery;  Laterality: N/A;  Cervical four-five  Anterior cervical decompression/diskectomy/fusion   Past Medical History  Diagnosis Date  . Osteoporosis   . Varicosities     venous  . Cardiomyopathy HX --06/2010    EF was 25% during acute illness (PHELONEPHRITIS) Repeat echo 12-06-10 60% showed normal EF.   Marland Kitchen Hypertension   . Headache   . PONV (postoperative nausea and vomiting)   . Heart murmur     DENIES S & S   (ECHO JUN'12 W/ CHART)  . History of chronic bronchitis   . Chronic back pain greater than 3 months duration     S/P BACK SURG'S  . Arachnoiditis BILATERAL LEGS    DUE TO MULTIPLE BACK SURG.'S  . Weakness of both legs DUE TO ARACHNOIDITIS    OCCASIONAL USES CANE  . Blood transfusion   . GERD (gastroesophageal reflux disease) AND HIATIAL HERNIA     CONTROLLED W/ NEXIUM  . Acute sinusitis, unspecified   . Anxiety state, unspecified   . Essential hypertension, benign   . Dyslipidemia   . Other malaise and fatigue   . Murmur, heart   . Shortness of breath   . Neuromuscular disorder     numbness and tingling  . Arthritis   . Hx of bladder infections    Ht 5\' 5"  (1.651 m)  Wt 165 lb (74.844 kg)  BMI 27.46 kg/m2  BP 132/44  P 93 02sat 92% r 14    Review of Systems  Constitutional: Positive for fever.  HENT: Positive for neck pain.   Respiratory: Positive for cough, shortness of breath and wheezing.   Musculoskeletal: Positive for gait problem.  All other systems reviewed and are negative.       Objective:   Physical Exam Constitutional: She is oriented to person, place, and time. She appears well-developed and well-nourished.  HENT:  Head: Normocephalic.  Musculoskeletal: She exhibits tenderness.  Neurological: She is alert and oriented to person, place, and time.  Skin: Skin is warm and dry.  Psychiatric: She has a normal mood and affect.  Symmetric normal motor tone is noted throughout. Normal muscle bulk. Muscle testing reveals 5/5 muscle strength of the right upper extremity, left not tested today due to pain after a fall, and 5/5 of the lower extremity, some diffuse give away weakness and some diffuse tremor. Full range of motion in upper and lower extremities. ROM of spine is restricted, C-spine ext 30 degrees, flex 45 degrees, rot 45 degrees to the right, 15 degrees to the left, restricted by pain. Fine motor movements are restricted in both hands, osteoarthritis and hand surgery.  DTR in the upper and lower extremity are present and symmetric 2+. No clonus is noted.  Patient arises from chair with mild difficulty. Narrow based gait with normal arm swing bilateral , able to walk on heels and toes . Tandem walk is stable. No pronator drift. Rhomberg negative.        Assessment & Plan:  1. 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 Increased severe pain and clicking sound with rotation to the left. CT-myelogram from Dec. 2013 showed :  1. 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 Jeral Fruit on 08/09/2012, slowly recovering.  2. Stable fusion at C5-6.  2. Post lumbar post laminectomy syndrome.  3. 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  4. 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 Left elbow pain with restricted ROM, x-ray of left elbow did not show significant findings, has resloved.  6. Allergies, Asthma, she will follow up with her pulmonologist today. PLAN:  1. Discussed adjusting neurontin to take 900 mg qhs and 300mg  at breakfast and mid day. Patient did not tolerate the increased dose well, she states, that she is taking the gabapentin 600mg  tid  2. Spinal stimulator is still in the discussion for the arachnoiditis.  3. Continue with Opana ER to 40mg  q12 #60  4. Will continue oxy IR 15mg , # 90 for breakthrough.  Continue to focus on appropriate posture as tolerated , continue with Aspen collar if needed.  5. Flexeril and valium prn for spasms  6. The patient will see PA in one month.. All questions were encouraged and answered.

## 2012-10-21 NOTE — Patient Instructions (Signed)
Stay as active as tolerated. 

## 2012-10-21 NOTE — Patient Instructions (Addendum)
Omnicef 300mg  Twice daily  For 7 days  Mucinex DM Twice daily  As needed  Cough/congestion  Fluids and rest  Saline nasal rinses  Please contact office for sooner follow up if symptoms do not improve or worsen or seek emergency care  follow up Dr. Marchelle Gearing as planned and As needed

## 2012-10-21 NOTE — Progress Notes (Signed)
Subjective:    Patient ID: [name] [name], female    DOB: 1951-06-13, 62 y.o.   MRN: [id number]  HPI  PCP is HASANAJ,XAJE A, MD Cards is dr Daiva Nakayama  Body mass index is 26.83 kg/(m^2). Passive smoker x 17 years   IOV 04/22/2012 62 year old female. Referred for dyspnea. Wondering if she has passive smoking related lung disease (husband and son)  Dyspnea: In 2011 December was critically ill following spine surgery, had UTI and low EF but since then has recovered from critical illness. Says she has been dyspneic even before this critical illness for total 3-4 years but since past 1 year more noticeable dyspnea. Sometimes dyspneic walking from room to room. Sometimes dyspneic talking on phone. Triggers include cig smoke  Of husband ("husband's cig smoke is killing me and they think I am crazy") and is associated with cough at that time. Dyspnea is slowly progressive. Severity is mild-moderate. No orthopnea. No paroxysmal nocturnal dyspnea but has woken up wheezing at night since sept 2013 (was exposed to perfume at doctor office and dx acute bronchitis and given abx). THere is associated cough but only episodic.  There is no edema, hemoptysis, unintenional weight loss.   Walking desaturation test on 04/23/2012 185 feet x 3 laps:  did NOT desaturate. Rest pulse ox was 97%, final pulse ox was 94%. HR response 84/min at rest to 86/min at peak exertion.   RElevant hx   Fused L2-L5 x 3 spine surgery due to disc disease, osteoporosis, and also fall, various reasons  LABS - cxr sept 2012: clear lung fields - hgb nov 2012: 11.7gm%  - cardiac 04/01/12: Normal stress test with normal EF - no hx of PFTs  REC #Shortness of breath  - our check list says latex allergy is cautionary for flu shot; do not know why but we will defer flu shot today  - CMA/RN will walk you for oxygen levels now 04/22/2012  - please have full PFT breathing test at [place] or [place] or [place] at your convenience; soon  after doing it call us at [phone] and pass message on so I can review and advise next step      Telephone calls Dec - FEb 2014 PFT 05/27/12 is normal ecwpt mild reduction in diffusion (74%) and 8% BD response. These could be freatures of early emphysema, asthma, or scarring in lungs or normal variant. To better understand this - do HRCT chest. IF this is normal, needs methacholine challenge test for asthma and if that too is normal needs bike pulmonary stress test. She is aware of this algorithm. I will stay in touch by phone. For now, get CT chest (ordered)  CT chest does not show reason for shortness of breath. Next to do is methacholine challenge test; ordered. She is do and give [place] a call so I can review it. She know the algorithm. CT does show 3mm lung nodules -very very very rare < 1:1000 chance it is serious like cancer. She needs repeat ct chest in 1 year without cotnrast (ordered)  Methacholine challenge test on 08/07/2012 is positive for asthma. PD 20 at less than 4.0. No flow volume loop issues   OV 09/24/2012 Followup 3 mm lung nodule and Followup for dyspnea on the basis of positive methacholine challenge test   At the end of last visit she had diagnostic workup. She has been diagnosed with 3 mm lung nodule and also positive methacholine challenge test. For the past few weeks  she is taking Symbicort 2 puff twice daily. This is helping with her symptoms. She is absolutely petrified about developing lung cancer. She is upset that her husband and son continued to smoke and had a house and this is an etiological agent for lung cancer. She is also worried that this will make her asthma worse. I have agreed with her sentiments. There are no new issues >>no changes    10/21/2012 Acute OV  Complains of increased SOB, prod cough with yellow mucus, wheezing, chest tightness, HA, chills x5days, worse x2 days. Taking mucinex without much help.  Using her friends neb machine with albuterol .  Has  sinus congestion and pressure.  Has low grade fevers.  No hemoptysis, chest pain, edema or n/v.      Review of Systems  Constitutional: Negative for fever and unexpected weight change.  HENT: Negative for ear pain, nosebleeds, congestion, sore throat, rhinorrhea, sneezing, trouble swallowing, dental problem, postnasal drip and sinus pressure.   Eyes: Negative for redness and itching.  Respiratory: Positive for shortness of breath.  Cardiovascular: Negative for palpitations and leg swelling.  Gastrointestinal: Negative for nausea and vomiting.  Genitourinary: Negative for dysuria.  Musculoskeletal: Negative for joint swelling.  Skin: Negative for rash.  Allergic/Immunologic: Positive for environmental allergies.  Neurological: Negative for headaches.  Hematological: Does not bruise/bleed easily.  Psychiatric/Behavioral: Negative for dysphoric mood. The patient is not nervous/anxious.       Objective:  GEN: A/Ox3; pleasant , NAD, well nourished   HEENT:  [place]/AT,  EACs-clear, TMs-wnl, NOSE-clear drainage  THROAT-clear, no lesions, no postnasal drip or exudate noted.   NECK:  Supple w/ fair ROM; no JVD; normal carotid impulses w/o bruits; no thyromegaly or nodules palpated; no lymphadenopathy.  RESP  Few rhonchi, no sign wheezing no accessory muscle use, no dullness to percussion  CARD:  RRR, no m/r/g  , no peripheral edema, pulses intact, no cyanosis or clubbing.  GI:   Soft & nt; nml bowel sounds; no organomegaly or masses detected.  Musco: Warm bil, no deformities or joint swelling noted.   Neuro: alert, no focal deficits noted.    Skin: Warm, no lesions or rashes        Assessment & Plan:

## 2012-10-21 NOTE — Assessment & Plan Note (Signed)
Flare with URI  xopenex neb x 1   Plan  Omnicef 300mg  Twice daily  For 7 days  Mucinex DM Twice daily  As needed  Cough/congestion  Fluids and rest  Saline nasal rinses  Please contact office for sooner follow up if symptoms do not improve or worsen or seek emergency care  follow up Dr. Marchelle Gearing as planned and As needed

## 2012-10-23 MED ORDER — LEVALBUTEROL HCL 0.63 MG/3ML IN NEBU: 0.6300 mg | INHALATION_SOLUTION | Freq: Once | RESPIRATORY_TRACT | Status: AC

## 2012-10-23 NOTE — Addendum Note (Signed)
Addended by: Boone Master E on: 10/23/2012 05:22 PM   Modules accepted: Orders

## 2012-10-24 DIAGNOSIS — M47812 Spondylosis without myelopathy or radiculopathy, cervical region: Secondary | ICD-10-CM | POA: Diagnosis not present

## 2012-10-24 DIAGNOSIS — M542 Cervicalgia: Secondary | ICD-10-CM | POA: Diagnosis not present

## 2012-10-24 DIAGNOSIS — M25559 Pain in unspecified hip: Secondary | ICD-10-CM | POA: Diagnosis not present

## 2012-10-25 DIAGNOSIS — J449 Chronic obstructive pulmonary disease, unspecified: Secondary | ICD-10-CM | POA: Diagnosis not present

## 2012-11-06 ENCOUNTER — Ambulatory Visit (INDEPENDENT_AMBULATORY_CARE_PROVIDER_SITE_OTHER): Payer: Medicare Other | Admitting: Pulmonary Disease

## 2012-11-06 ENCOUNTER — Encounter: Payer: Self-pay | Admitting: Pulmonary Disease

## 2012-11-06 VITALS — BP 128/88 | HR 70 | Temp 97.6°F | Ht 63.5 in | Wt 159.8 lb

## 2012-11-06 DIAGNOSIS — R059 Cough, unspecified: Secondary | ICD-10-CM

## 2012-11-06 DIAGNOSIS — R05 Cough: Secondary | ICD-10-CM | POA: Diagnosis not present

## 2012-11-06 MED ORDER — TRAMADOL HCL 50 MG PO TABS: 50.0000 mg | ORAL_TABLET | Freq: Four times a day (QID) | ORAL | Status: DC | PRN

## 2012-11-06 NOTE — Progress Notes (Signed)
  Subjective:    Patient ID: [name] [name], female    DOB: 1950/07/14, 62 y.o.   MRN: [id number]  HPI The patient comes in today for an acute sick visit for chronic cough.  She has known asthma as diagnosed by a positive methacholine challenge test, and has been on symbicort compliantly.  She has had this ongoing cough that has not responded to multiple rounds of antibiotics or prednisone.  She is describing frequent cough paroxysms, almost to the point of vomiting.  It is primarily dry and hacking in nature with minimal mucus.  She feels a classic globus sensation in her throat, and is clearing her throat continuously during our visit.  She denies having significant postnasal drip or sinus pressure, though does have frontal headaches.  She has a history of reflux disease, but does not feel that her symptoms have worsened.  She is really not more short of breath except when she is having her coughing spells.   Review of Systems  Constitutional: Negative for fever and unexpected weight change.  HENT: Positive for voice change. Negative for ear pain, nosebleeds, congestion, sore throat, rhinorrhea, sneezing, trouble swallowing, dental problem, postnasal drip and sinus pressure.   Eyes: Negative for redness and itching.  Respiratory: Positive for cough, shortness of breath and wheezing. Negative for chest tightness.   Cardiovascular: Negative for palpitations and leg swelling.  Gastrointestinal: Negative for nausea and vomiting.  Genitourinary: Negative for dysuria.  Musculoskeletal: Negative for joint swelling.  Skin: Negative for rash.  Neurological: Negative for headaches.  Hematological: Does not bruise/bleed easily.  Psychiatric/Behavioral: Negative for dysphoric mood. The patient is not nervous/anxious.        Objective:   Physical Exam Well-developed female in no acute distress voice hoarse and squeaky. Nose without purulence or discharge noted Oropharynx clear Neck without  lymphadenopathy or thyromegaly Chest totally clear to auscultation with excellent air flow and no wheezing Cardiac exam with regular rate and rhythm Lower extremities without edema, no cyanosis Alert and oriented, moves all 4 extremities.       Assessment & Plan:

## 2012-11-06 NOTE — Patient Instructions (Addendum)
Increase nexium to am AND pm for next 3 weeks. Take chlorpheniramine 4mg , take 2 at bedtime each night NO throat clearing!!  Use hard candy during waking hours to bathe back of throat.  If you feel you need to clear your throat, just swallow or drink something.  Minimize voice use as much as possible.  No yelling, singing, or talking on phone.  Can try tramadol 50mg  every  6hrs for cough if needed.  followup with Dr. Marchelle Gearing in 3 weeks to check on progress.

## 2012-11-06 NOTE — Assessment & Plan Note (Signed)
From the patient's description, her cough is coming from the upper airway instead of lower.  She has not responded to multiple rounds of antibiotics and prednisone, and does not see any difference with her nebulizer treatments.  I have told her this is because her cough is not from asthma.  She is hoarse, and describes a classic globus sensation with persistent throat clearing.  Her history really does not suggest chronic sinusitis.  I would like to treat her aggressively for postnasal drip and reflux disease, and I have also reviewed behavioral therapy with her to prevent throat clearing.  She is unable to take codeine containing medication, and therefore will try her on tramadol to see if we can decrease the tickle in her upper airway.

## 2012-11-12 ENCOUNTER — Other Ambulatory Visit: Payer: Self-pay

## 2012-11-12 DIAGNOSIS — Z1231 Encounter for screening mammogram for malignant neoplasm of breast: Secondary | ICD-10-CM

## 2012-11-18 ENCOUNTER — Encounter: Payer: Self-pay | Admitting: Physical Medicine and Rehabilitation

## 2012-11-18 ENCOUNTER — Encounter
Payer: Medicare Other | Attending: Physical Medicine and Rehabilitation | Admitting: Physical Medicine and Rehabilitation

## 2012-11-18 VITALS — BP 132/79 | HR 74 | Resp 14 | Ht 63.5 in | Wt 164.0 lb

## 2012-11-18 DIAGNOSIS — F411 Generalized anxiety disorder: Secondary | ICD-10-CM | POA: Insufficient documentation

## 2012-11-18 DIAGNOSIS — R209 Unspecified disturbances of skin sensation: Secondary | ICD-10-CM | POA: Insufficient documentation

## 2012-11-18 DIAGNOSIS — F3289 Other specified depressive episodes: Secondary | ICD-10-CM | POA: Insufficient documentation

## 2012-11-18 DIAGNOSIS — M542 Cervicalgia: Secondary | ICD-10-CM | POA: Diagnosis not present

## 2012-11-18 DIAGNOSIS — M4802 Spinal stenosis, cervical region: Secondary | ICD-10-CM | POA: Insufficient documentation

## 2012-11-18 DIAGNOSIS — T7840XA Allergy, unspecified, initial encounter: Secondary | ICD-10-CM | POA: Diagnosis not present

## 2012-11-18 DIAGNOSIS — M5380 Other specified dorsopathies, site unspecified: Secondary | ICD-10-CM | POA: Insufficient documentation

## 2012-11-18 DIAGNOSIS — F329 Major depressive disorder, single episode, unspecified: Secondary | ICD-10-CM | POA: Insufficient documentation

## 2012-11-18 DIAGNOSIS — M25529 Pain in unspecified elbow: Secondary | ICD-10-CM | POA: Insufficient documentation

## 2012-11-18 DIAGNOSIS — G039 Meningitis, unspecified: Secondary | ICD-10-CM | POA: Diagnosis not present

## 2012-11-18 DIAGNOSIS — M961 Postlaminectomy syndrome, not elsewhere classified: Secondary | ICD-10-CM | POA: Diagnosis not present

## 2012-11-18 DIAGNOSIS — M79609 Pain in unspecified limb: Secondary | ICD-10-CM | POA: Insufficient documentation

## 2012-11-18 DIAGNOSIS — J45909 Unspecified asthma, uncomplicated: Secondary | ICD-10-CM | POA: Insufficient documentation

## 2012-11-18 DIAGNOSIS — Z981 Arthrodesis status: Secondary | ICD-10-CM | POA: Diagnosis not present

## 2012-11-18 DIAGNOSIS — G8929 Other chronic pain: Secondary | ICD-10-CM | POA: Insufficient documentation

## 2012-11-18 DIAGNOSIS — X58XXXA Exposure to other specified factors, initial encounter: Secondary | ICD-10-CM | POA: Insufficient documentation

## 2012-11-18 MED ORDER — OXYCODONE HCL 15 MG PO TABS: 15.0000 mg | ORAL_TABLET | Freq: Three times a day (TID) | ORAL | Status: DC | PRN

## 2012-11-18 MED ORDER — OXYMORPHONE HCL ER 40 MG PO TB12: 40.0000 mg | ORAL_TABLET | Freq: Two times a day (BID) | ORAL | Status: DC

## 2012-11-18 NOTE — Progress Notes (Signed)
Subjective:    Patient ID: [name] [name], female    DOB: [birth year], 62 y.o.   MRN: [id number]  HPI The patient complains about chronic neck pain which radiates into back of her head and into her posterior right arm. The patient also complains about intermittend numbness and tingling between her shoulder blades. These symptoms had resolved after surgery, but they are starting to slowly come back since last month.  She had a MRI, which showed multifactorial stenosis at C4-5 above her fusion C5-6. She states, that she had surgery on 08/09/2012, ACDF C4-5, by Dr. Jeral Fruit. She reports that the Roxicodone are not giving her that much relief anymore. She also states, that she has a diagnosis of depression and anxiety, but she feels that she does not really have anxiety nor depression.  Pain Inventory Average Pain 7 Pain Right Now 9 My pain is sharp, burning, tingling and aching  In the last 24 hours, has pain interfered with the following? General activity 8 Relation with others 8 Enjoyment of life 8 What TIME of day is your pain at its worst? all the time Sleep (in general) Poor  Pain is worse with: walking, sitting, standing and some activites Pain improves with: heat/ice and medication Relief from Meds: 6  Mobility use a cane how many minutes can you walk? 10-15 ability to climb steps?  yes do you drive?  yes  Function Do you have any goals in this area?  no  Neuro/Psych weakness numbness tingling trouble walking spasms dizziness  Prior Studies Any changes since last visit?  no  Physicians involved in your care pulmonary   Family History  Problem Relation Age of Onset  . Hypertension Mother   . Heart disease Mother   . Diabetes Mother   . Cancer Father   . Diabetes Brother    History   Social History  . Marital Status: Legally Separated    Spouse Name: N/A    Number of Children: N/A  . Years of Education: N/A   Occupational History  . disable    Social  History Main Topics  . Smoking status: Never Smoker   . Smokeless tobacco: Never Used  . Alcohol Use: No  . Drug Use: No  . Sexually Active: None   Other Topics Concern  . None   Social History Narrative   Has one child   Disabled   Past Surgical History  Procedure Laterality Date  . Lumbar fusion  11-30-10    L4 - 5  . Exploration of incision for csf leak  DEC 2011  X2    POST LAMINECOTMY  . Lumbar laminectomy  DEC 2011    L4 - 5  . Lumbar fusion  2000    L2 - 4  . Cervical disc surgery  2005    C5 - 6  . Anterior fusion cervical spine  2007    C5 -6  . Tendon repair  JAN 2010    LEFT INDEX AND LONG FINGERS  . Left wrist tenosynectomy w/ left thumb joint repair  02-24-10  . Abdominal hysterectomy  1987    W/ BSO  . Cholecystectomy  1994  . Knee arthroscopy  LEFT X3 (LAST ONE 2005)  . Carpal tunnel release  RIGHT - 2001  & DEC 2011 W/ BACK SURG.  . Knee arthroscopy  05/17/2011    Procedure: ARTHROSCOPY KNEE;  Surgeon: Curlene Labrum III;  Location: [facility];  Service: Orthopedics;  Laterality: Right;  WITH MEDIAL MENISECTOMY AND removal of suprapatella fat lump  . Anterior cervical decomp/discectomy fusion N/A 08/09/2012    Procedure: ANTERIOR CERVICAL DECOMPRESSION/DISCECTOMY FUSION 1 LEVEL;  Surgeon: Karn Cassis, MD;  Location: [facility];  Service: Neurosurgery;  Laterality: N/A;  Cervical four-five  Anterior cervical decompression/diskectomy/fusion   Past Medical History  Diagnosis Date  . Osteoporosis   . Varicosities     venous  . Cardiomyopathy HX --06/2010    EF was 25% during acute illness (PHELONEPHRITIS) Repeat echo 12-06-10 60% showed normal EF.   Marland Kitchen Hypertension   . Headache(784.0)   . PONV (postoperative nausea and vomiting)   . Heart murmur     DENIES S & S   (ECHO JUN'12 W/ CHART)  . History of chronic bronchitis   . Chronic back pain greater than 3 months duration     S/P BACK SURG'S  . Arachnoiditis BILATERAL LEGS     DUE TO MULTIPLE BACK SURG.'S  . Weakness of both legs DUE TO ARACHNOIDITIS    OCCASIONAL USES CANE  . Blood transfusion   . GERD (gastroesophageal reflux disease) AND HIATIAL HERNIA    CONTROLLED W/ NEXIUM  . Acute sinusitis, unspecified   . Anxiety state, unspecified   . Essential hypertension, benign   . Dyslipidemia   . Other malaise and fatigue   . Murmur, heart   . Shortness of breath   . Neuromuscular disorder     numbness and tingling  . Arthritis   . Hx of bladder infections    BP 132/79  Pulse 74  Resp 14  Ht 5' 3.5" (1.613 m)  Wt 164 lb (74.39 kg)  BMI 28.59 kg/m2  SpO2 97%     Review of Systems  HENT: Positive for neck pain.   Respiratory: Positive for cough and shortness of breath.   Gastrointestinal: Positive for vomiting.  Musculoskeletal: Positive for gait problem.  Neurological: Positive for dizziness, weakness and numbness.  All other systems reviewed and are negative.       Objective:   Physical Exam Constitutional: She is oriented to person, place, and time. She appears well-developed and well-nourished.  HENT:  Head: Normocephalic.  Musculoskeletal: She exhibits tenderness.  Neurological: She is alert and oriented to person, place, and time.  Skin: Skin is warm and dry.  Psychiatric: She has a normal mood and affect.  Symmetric normal motor tone is noted throughout. Normal muscle bulk. Muscle testing reveals 5/5 muscle strength of the right upper extremity, left not tested today due to pain after a fall, and 5/5 of the lower extremity, some diffuse give away weakness and some diffuse tremor. Full range of motion in upper and lower extremities. ROM of spine is restricted, C-spine ext 30 degrees, flex 45 degrees, rot 45 degrees to the right, 15 degrees to the left, restricted by pain. Fine motor movements are restricted in both hands, osteoarthritis and hand surgery.  DTR in the upper and lower extremity are present and symmetric 2+. No clonus is  noted.  Patient arises from chair with mild difficulty. Narrow based gait with normal arm swing bilateral , able to walk on heels and toes . Tandem walk is stable. No pronator drift. Rhomberg negative.        Assessment & Plan:  1. 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 Increased severe pain and clicking sound with rotation to the left. CT-myelogram from Dec. 2013 showed :  1. 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 Jeral Fruit on 08/09/2012, slowly recovering.  2. Stable fusion at C5-6.  2. Post lumbar post laminectomy syndrome.  3. 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  4. 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 Left elbow pain with restricted ROM, x-ray of left elbow did not show significant findings, has resloved.  6. Allergies, Asthma, she will follow up with her pulmonologist today.  PLAN:  1. Discussed adjusting neurontin to take 900 mg qhs and 300mg  at breakfast and mid day. Patient did not tolerate the increased dose well, she states, that she is taking the gabapentin 600mg  tid  2. Spinal stimulator is still in the discussion for the arachnoiditis.  3. Continue with Opana ER to 40mg  q12 #60  4. Will continue oxy IR 15mg , # 90 for breakthrough.  Continue to focus on appropriate posture as tolerated , continue with Aspen collar if needed.  5. Flexeril and valium prn for spasms  6. Follow up with Dr. Riley Kill in one month. She wants to discuss with Dr. Riley Kill that the Roxicodone are not giving her that much relief anymore. Also  that she has a diagnosis of depression and anxiety, but she feels that she does not really have anxiety nor depression.  All questions were encouraged and answered

## 2012-11-18 NOTE — Patient Instructions (Signed)
Stay as active as tolerated, try to keep a good posture.

## 2012-11-26 ENCOUNTER — Ambulatory Visit: Payer: Medicare Other | Attending: Neurosurgery | Admitting: Physical Therapy

## 2012-11-26 DIAGNOSIS — M2560 Stiffness of unspecified joint, not elsewhere classified: Secondary | ICD-10-CM | POA: Insufficient documentation

## 2012-11-26 DIAGNOSIS — M79609 Pain in unspecified limb: Secondary | ICD-10-CM | POA: Insufficient documentation

## 2012-11-26 DIAGNOSIS — IMO0001 Reserved for inherently not codable concepts without codable children: Secondary | ICD-10-CM | POA: Diagnosis not present

## 2012-11-26 DIAGNOSIS — R5381 Other malaise: Secondary | ICD-10-CM | POA: Insufficient documentation

## 2012-11-26 DIAGNOSIS — M542 Cervicalgia: Secondary | ICD-10-CM | POA: Diagnosis not present

## 2012-11-26 DIAGNOSIS — R209 Unspecified disturbances of skin sensation: Secondary | ICD-10-CM | POA: Insufficient documentation

## 2012-12-02 ENCOUNTER — Encounter: Payer: Medicare Other | Admitting: Physical Therapy

## 2012-12-04 ENCOUNTER — Ambulatory Visit (INDEPENDENT_AMBULATORY_CARE_PROVIDER_SITE_OTHER): Payer: Medicare Other | Admitting: Adult Health

## 2012-12-04 ENCOUNTER — Encounter: Payer: Self-pay | Admitting: Adult Health

## 2012-12-04 VITALS — BP 140/88 | HR 63 | Temp 97.7°F | Ht 63.25 in | Wt 157.2 lb

## 2012-12-04 DIAGNOSIS — J45909 Unspecified asthma, uncomplicated: Secondary | ICD-10-CM

## 2012-12-04 DIAGNOSIS — J4530 Mild persistent asthma, uncomplicated: Secondary | ICD-10-CM

## 2012-12-04 NOTE — Patient Instructions (Addendum)
Cont on current regimen  follow up Dr. Marchelle Gearing in 1 month and As needed

## 2012-12-05 ENCOUNTER — Encounter: Payer: Medicare Other | Admitting: Physical Therapy

## 2012-12-06 NOTE — Assessment & Plan Note (Signed)
Recent flare w/ slow to resolve upper airway cough -improved   Plan  Cont on current regimen  follow up Dr. Marchelle Gearing in 1 month and As needed

## 2012-12-06 NOTE — Progress Notes (Signed)
Subjective:    Patient ID: [name] [name], female    DOB: Jul 01, 1950, 62 y.o.   MRN: [id number]  HPI  PCP is HASANAJ,XAJE A, MD Cards is dr Daiva Nakayama  Body mass index is 26.83 kg/(m^2). Passive smoker x 17 years   IOV 04/22/2012 62 year old female. Referred for dyspnea. Wondering if she has passive smoking related lung disease (husband and son)  Dyspnea: In 2011 December was critically ill following spine surgery, had UTI and low EF but since then has recovered from critical illness. Says she has been dyspneic even before this critical illness for total 3-4 years but since past 1 year more noticeable dyspnea. Sometimes dyspneic walking from room to room. Sometimes dyspneic talking on phone. Triggers include cig smoke  Of husband ("husband's cig smoke is killing me and they think I am crazy") and is associated with cough at that time. Dyspnea is slowly progressive. Severity is mild-moderate. No orthopnea. No paroxysmal nocturnal dyspnea but has woken up wheezing at night since sept 2013 (was exposed to perfume at doctor office and dx acute bronchitis and given abx). THere is associated cough but only episodic.  There is no edema, hemoptysis, unintenional weight loss.   Walking desaturation test on 04/23/2012 185 feet x 3 laps:  did NOT desaturate. Rest pulse ox was 97%, final pulse ox was 94%. HR response 84/min at rest to 86/min at peak exertion.   RElevant hx   Fused L2-L5 x 3 spine surgery due to disc disease, osteoporosis, and also fall, various reasons  LABS - cxr sept 2012: clear lung fields - hgb nov 2012: 11.7gm%  - cardiac 04/01/12: Normal stress test with normal EF - no hx of PFTs  REC #Shortness of breath  - our check list says latex allergy is cautionary for flu shot; do not know why but we will defer flu shot today  - CMA/RN will walk you for oxygen levels now 04/22/2012  - please have full PFT breathing test at [place] or [place] or [place] at your convenience; soon  after doing it call us at [phone] and pass message on so I can review and advise next step      Telephone calls Dec - FEb 2014 PFT 05/27/12 is normal ecwpt mild reduction in diffusion (74%) and 8% BD response. These could be freatures of early emphysema, asthma, or scarring in lungs or normal variant. To better understand this - do HRCT chest. IF this is normal, needs methacholine challenge test for asthma and if that too is normal needs bike pulmonary stress test. She is aware of this algorithm. I will stay in touch by phone. For now, get CT chest (ordered)  CT chest does not show reason for shortness of breath. Next to do is methacholine challenge test; ordered. She is do and give [place] a call so I can review it. She know the algorithm. CT does show 3mm lung nodules -very very very rare < 1:1000 chance it is serious like cancer. She needs repeat ct chest in 1 year without cotnrast (ordered)  Methacholine challenge test on 08/07/2012 is positive for asthma. PD 20 at less than 4.0. No flow volume loop issues   OV 09/24/2012 Followup 3 mm lung nodule and Followup for dyspnea on the basis of positive methacholine challenge test   At the end of last visit she had diagnostic workup. She has been diagnosed with 3 mm lung nodule and also positive methacholine challenge test. For the past few weeks  she is taking Symbicort 2 puff twice daily. This is helping with her symptoms. She is absolutely petrified about developing lung cancer. She is upset that her husband and son continued to smoke and had a house and this is an etiological agent for lung cancer. She is also worried that this will make her asthma worse. I have agreed with her sentiments. There are no new issues >>no changes    10/21/2012 Acute OV  Complains of increased SOB, prod cough with yellow mucus, wheezing, chest tightness, HA, chills x5days, worse x2 days. Taking mucinex without much help.  Using her friends neb machine with albuterol .  Has  sinus congestion and pressure.  Has low grade fevers.  No hemoptysis, chest pain, edema or n/v.  >omnicef rx   12/04/12 Follow up  3 week follow up - reports is doing better but still having some increased SOB esp with the heat.  still having difficulty with smoke exposure at home No fever or chest pain  No edema      Review of Systems  Constitutional: Negative for fever and unexpected weight change.  HENT: Negative for ear pain, nosebleeds, congestion, sore throat, rhinorrhea, sneezing, trouble swallowing, dental problem, postnasal drip and sinus pressure.   Eyes: Negative for redness and itching.  Respiratory:no hemoptysis  Cardiovascular: Negative for palpitations and leg swelling.  Gastrointestinal: Negative for nausea and vomiting.  Genitourinary: Negative for dysuria.  Musculoskeletal: Negative for joint swelling.  Skin: Negative for rash.  Allergic/Immunologic: Positive for environmental allergies.  Neurological: Negative for headaches.  Hematological: Does not bruise/bleed easily.  Psychiatric/Behavioral: Negative for dysphoric mood. The patient is not nervous/anxious.       Objective:  GEN: A/Ox3; pleasant , NAD, well nourished   HEENT:  /AT,  EACs-clear, TMs-wnl, NOSE-clear drainage  THROAT-clear, no lesions, no postnasal drip or exudate noted.   NECK:  Supple w/ fair ROM; no JVD; normal carotid impulses w/o bruits; no thyromegaly or nodules palpated; no lymphadenopathy.  RESP  Decreased BS in bases , no accessory muscle use, no dullness to percussion  CARD:  RRR, no m/r/g  , no peripheral edema, pulses intact, no cyanosis or clubbing.  GI:   Soft & nt; nml bowel sounds; no organomegaly or masses detected.  Musco: Warm bil, no deformities or joint swelling noted.   Neuro: alert, no focal deficits noted.    Skin: Warm, no lesions or rashes    cxr 08/09/12 w/ no acute process noted.        Assessment & Plan:

## 2012-12-10 ENCOUNTER — Ambulatory Visit: Payer: Medicare Other | Admitting: Physical Therapy

## 2012-12-12 ENCOUNTER — Ambulatory Visit: Payer: Medicare Other | Admitting: Physical Therapy

## 2012-12-17 ENCOUNTER — Encounter: Payer: Medicare Other | Admitting: Physical Therapy

## 2012-12-17 DIAGNOSIS — M542 Cervicalgia: Secondary | ICD-10-CM | POA: Diagnosis not present

## 2012-12-19 ENCOUNTER — Encounter
Payer: Medicare Other | Attending: Physical Medicine and Rehabilitation | Admitting: Physical Medicine and Rehabilitation

## 2012-12-19 ENCOUNTER — Encounter: Payer: Self-pay | Admitting: Physical Medicine and Rehabilitation

## 2012-12-19 ENCOUNTER — Ambulatory Visit: Payer: Medicare Other

## 2012-12-19 VITALS — BP 142/72 | HR 85 | Resp 16 | Ht 63.0 in | Wt 166.0 lb

## 2012-12-19 DIAGNOSIS — M961 Postlaminectomy syndrome, not elsewhere classified: Secondary | ICD-10-CM | POA: Insufficient documentation

## 2012-12-19 DIAGNOSIS — M810 Age-related osteoporosis without current pathological fracture: Secondary | ICD-10-CM | POA: Insufficient documentation

## 2012-12-19 DIAGNOSIS — F3289 Other specified depressive episodes: Secondary | ICD-10-CM | POA: Insufficient documentation

## 2012-12-19 DIAGNOSIS — M5380 Other specified dorsopathies, site unspecified: Secondary | ICD-10-CM | POA: Insufficient documentation

## 2012-12-19 DIAGNOSIS — G039 Meningitis, unspecified: Secondary | ICD-10-CM | POA: Insufficient documentation

## 2012-12-19 DIAGNOSIS — Z981 Arthrodesis status: Secondary | ICD-10-CM | POA: Insufficient documentation

## 2012-12-19 DIAGNOSIS — R51 Headache: Secondary | ICD-10-CM | POA: Diagnosis not present

## 2012-12-19 DIAGNOSIS — J45909 Unspecified asthma, uncomplicated: Secondary | ICD-10-CM | POA: Insufficient documentation

## 2012-12-19 DIAGNOSIS — E785 Hyperlipidemia, unspecified: Secondary | ICD-10-CM | POA: Diagnosis not present

## 2012-12-19 DIAGNOSIS — K219 Gastro-esophageal reflux disease without esophagitis: Secondary | ICD-10-CM | POA: Diagnosis not present

## 2012-12-19 DIAGNOSIS — M79609 Pain in unspecified limb: Secondary | ICD-10-CM | POA: Diagnosis not present

## 2012-12-19 DIAGNOSIS — I10 Essential (primary) hypertension: Secondary | ICD-10-CM | POA: Diagnosis not present

## 2012-12-19 DIAGNOSIS — M25529 Pain in unspecified elbow: Secondary | ICD-10-CM | POA: Diagnosis not present

## 2012-12-19 DIAGNOSIS — T7840XA Allergy, unspecified, initial encounter: Secondary | ICD-10-CM | POA: Insufficient documentation

## 2012-12-19 DIAGNOSIS — M542 Cervicalgia: Secondary | ICD-10-CM | POA: Insufficient documentation

## 2012-12-19 DIAGNOSIS — F329 Major depressive disorder, single episode, unspecified: Secondary | ICD-10-CM | POA: Diagnosis not present

## 2012-12-19 DIAGNOSIS — X58XXXA Exposure to other specified factors, initial encounter: Secondary | ICD-10-CM | POA: Insufficient documentation

## 2012-12-19 DIAGNOSIS — G8929 Other chronic pain: Secondary | ICD-10-CM | POA: Diagnosis not present

## 2012-12-19 DIAGNOSIS — F411 Generalized anxiety disorder: Secondary | ICD-10-CM | POA: Insufficient documentation

## 2012-12-19 MED ORDER — OXYMORPHONE HCL ER 40 MG PO TB12: 40.0000 mg | ORAL_TABLET | Freq: Two times a day (BID) | ORAL | Status: DC

## 2012-12-19 MED ORDER — OXYCODONE HCL 15 MG PO TABS: 15.0000 mg | ORAL_TABLET | Freq: Three times a day (TID) | ORAL | Status: DC | PRN

## 2012-12-19 MED ORDER — GABAPENTIN 600 MG PO TABS: 600.0000 mg | ORAL_TABLET | Freq: Three times a day (TID) | ORAL | Status: DC

## 2012-12-19 NOTE — Progress Notes (Signed)
Subjective:    Patient ID: [name] [name], female    DOB: February 18, 1951, 62 y.o.   MRN: [id number]  HPI The patient complains about chronic neck pain which radiates into back of her head and into her posterior right arm. The patient also complains about intermittend numbness and tingling between her shoulder blades. These symptoms had resolved after surgery, but they are starting to slowly come back since last month.  She had a MRI, which showed multifactorial stenosis at C4-5 above her fusion C5-6. She states, that she had surgery on 08/09/2012, ACDF C4-5, by Dr. Jeral Fruit.  She reports that the Roxicodone are not giving her that much relief anymore. She also states, that she has a diagnosis of depression and anxiety, but she feels that she does not really have anxiety nor depression. The patient reports that she saw Dr. Jeral Fruit yesterday who sent her to ENT for her ongoing hoarseness and the feeling of constriction in her throat after her ACDF.   Pain Inventory Average Pain 8 Pain Right Now 9 My pain is sharp, burning, stabbing, tingling and aching  In the last 24 hours, has pain interfered with the following? General activity 9 Relation with others 8 Enjoyment of life 9 What TIME of day is your pain at its worst? constant Sleep (in general) Fair  Pain is worse with: walking, bending, sitting, inactivity, standing, unsure and some activites Pain improves with: heat/ice and medication Relief from Meds: 6  Mobility use a cane how many minutes can you walk? 10-15 ability to climb steps?  yes do you drive?  yes Do you have any goals in this area?  no  Function Do you have any goals in this area?  no  Neuro/Psych No problems in this area  Prior Studies Any changes since last visit?  yes x-rays  Physicians involved in your care Any changes since last visit?  no   Family History  Problem Relation Age of Onset  . Hypertension Mother   . Heart disease Mother   . Diabetes Mother    . Cancer Father   . Diabetes Brother    History   Social History  . Marital Status: Legally Separated    Spouse Name: N/A    Number of Children: N/A  . Years of Education: N/A   Occupational History  . disable    Social History Main Topics  . Smoking status: Never Smoker   . Smokeless tobacco: Never Used  . Alcohol Use: No  . Drug Use: No  . Sexually Active: None   Other Topics Concern  . None   Social History Narrative   Has one child   Disabled   Past Surgical History  Procedure Laterality Date  . Lumbar fusion  11-30-10    L4 - 5  . Exploration of incision for csf leak  DEC 2011  X2    POST LAMINECOTMY  . Lumbar laminectomy  DEC 2011    L4 - 5  . Lumbar fusion  2000    L2 - 4  . Cervical disc surgery  2005    C5 - 6  . Anterior fusion cervical spine  2007    C5 -6  . Tendon repair  JAN 2010    LEFT INDEX AND LONG FINGERS  . Left wrist tenosynectomy w/ left thumb joint repair  02-24-10  . Abdominal hysterectomy  1987    W/ BSO  . Cholecystectomy  1994  . Knee arthroscopy  LEFT X3 (LAST ONE  2005)  . Carpal tunnel release  RIGHT - 2001  & DEC 2011 W/ BACK SURG.  . Knee arthroscopy  05/17/2011    Procedure: ARTHROSCOPY KNEE;  Surgeon: Curlene Labrum III;  Location: [facility];  Service: Orthopedics;  Laterality: Right;  WITH MEDIAL MENISECTOMY AND removal of suprapatella fat lump  . Anterior cervical decomp/discectomy fusion N/A 08/09/2012    Procedure: ANTERIOR CERVICAL DECOMPRESSION/DISCECTOMY FUSION 1 LEVEL;  Surgeon: Karn Cassis, MD;  Location: [facility];  Service: Neurosurgery;  Laterality: N/A;  Cervical four-five  Anterior cervical decompression/diskectomy/fusion   Past Medical History  Diagnosis Date  . Osteoporosis   . Varicosities     venous  . Cardiomyopathy HX --06/2010    EF was 25% during acute illness (PHELONEPHRITIS) Repeat echo 12-06-10 60% showed normal EF.   Marland Kitchen Hypertension   . Headache(784.0)   . PONV  (postoperative nausea and vomiting)   . Heart murmur     DENIES S & S   (ECHO JUN'12 W/ CHART)  . History of chronic bronchitis   . Chronic back pain greater than 3 months duration     S/P BACK SURG'S  . Arachnoiditis BILATERAL LEGS    DUE TO MULTIPLE BACK SURG.'S  . Weakness of both legs DUE TO ARACHNOIDITIS    OCCASIONAL USES CANE  . Blood transfusion   . GERD (gastroesophageal reflux disease) AND HIATIAL HERNIA    CONTROLLED W/ NEXIUM  . Acute sinusitis, unspecified   . Anxiety state, unspecified   . Essential hypertension, benign   . Dyslipidemia   . Other malaise and fatigue   . Murmur, heart   . Shortness of breath   . Neuromuscular disorder     numbness and tingling  . Arthritis   . Hx of bladder infections    BP 142/72  Pulse 85  Resp 16  Ht 5\' 3"  (1.6 m)  Wt 166 lb (75.297 kg)  BMI 29.41 kg/m2  SpO2 94%      Review of Systems  All other systems reviewed and are negative.       Objective:   Physical Exam Constitutional: She is oriented to person, place, and time. She appears well-developed and well-nourished.  HENT:  Head: Normocephalic.  Musculoskeletal: She exhibits tenderness.  Neurological: She is alert and oriented to person, place, and time.  Skin: Skin is warm and dry.  Psychiatric: She has a normal mood and affect.  Symmetric normal motor tone is noted throughout. Normal muscle bulk. Muscle testing reveals 5/5 muscle strength of the right upper extremity, left not tested today due to pain after a fall, and 5/5 of the lower extremity, some diffuse give away weakness and some diffuse tremor. Full range of motion in upper and lower extremities. ROM of spine is restricted, C-spine ext 30 degrees, flex 45 degrees, rot 45 degrees to the right, 15 degrees to the left, restricted by pain. Fine motor movements are restricted in both hands, osteoarthritis and hand surgery.  DTR in the upper and lower extremity are present and symmetric 2+. No clonus is noted.   Patient arises from chair with mild difficulty. Narrow based gait with normal arm swing bilateral , able to walk on heels and toes . Tandem walk is stable. No pronator drift. Rhomberg negative.        Assessment & Plan:  1. 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 Increased severe pain and clicking sound with rotation to the left. CT-myelogram from  Dec. 2013 showed :  1. 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 Jeral Fruit on 08/09/2012, slowly recovering. Developed some hoarseness and feeling of constriction in her throat, Dr. Jeral Fruit referred her to ENT. 2. Stable fusion at C5-6.  2. Post lumbar post laminectomy syndrome.  3. 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  4. 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 Left elbow pain with restricted ROM, x-ray of left elbow did not show significant findings, has resloved.  6. Allergies, Asthma, she will follow up with her pulmonologist today.  PLAN:  1. Discussed adjusting neurontin to take 900 mg qhs and 300mg  at breakfast and mid day. Patient did not tolerate the increased dose well, she states, that she is taking the gabapentin 600mg  tid  2. Spinal stimulator is still in the discussion for the arachnoiditis.  3. Continue with Opana ER to 40mg  q12 #60  4. Will continue oxy IR 15mg , # 90 for breakthrough.  Continue to focus on appropriate posture as tolerated , continue with Aspen collar if needed.  5. Flexeril and valium prn for spasms  6. Follow up with Dr. Riley Kill in one month. She wants to discuss with Dr. Riley Kill that the Roxicodone are not giving her that much relief anymore. Also that she has a diagnosis of depression and anxiety, but she feels that she does not really have anxiety nor depression.  All questions were encouraged and answered

## 2012-12-19 NOTE — Patient Instructions (Addendum)
Try to stay as active as pain permits. Continue with the exercises in pool.

## 2013-01-06 ENCOUNTER — Ambulatory Visit: Payer: Medicare Other

## 2013-01-07 DIAGNOSIS — R490 Dysphonia: Secondary | ICD-10-CM | POA: Diagnosis not present

## 2013-01-07 DIAGNOSIS — J383 Other diseases of vocal cords: Secondary | ICD-10-CM | POA: Diagnosis not present

## 2013-01-07 DIAGNOSIS — R1310 Dysphagia, unspecified: Secondary | ICD-10-CM | POA: Diagnosis not present

## 2013-01-16 ENCOUNTER — Ambulatory Visit
Admission: RE | Admit: 2013-01-16 | Discharge: 2013-01-16 | Disposition: A | Discharge status: Home / Self Care | Payer: Medicare Other | Source: Ambulatory Visit

## 2013-01-16 ENCOUNTER — Encounter: Payer: Self-pay | Admitting: Physical Medicine and Rehabilitation

## 2013-01-16 ENCOUNTER — Encounter
Payer: Medicare Other | Attending: Physical Medicine and Rehabilitation | Admitting: Physical Medicine and Rehabilitation

## 2013-01-16 VITALS — BP 135/56 | HR 85 | Resp 14 | Ht 63.0 in | Wt 163.0 lb

## 2013-01-16 DIAGNOSIS — Z79899 Other long term (current) drug therapy: Secondary | ICD-10-CM | POA: Diagnosis not present

## 2013-01-16 DIAGNOSIS — M25529 Pain in unspecified elbow: Secondary | ICD-10-CM | POA: Insufficient documentation

## 2013-01-16 DIAGNOSIS — Z1231 Encounter for screening mammogram for malignant neoplasm of breast: Secondary | ICD-10-CM | POA: Diagnosis not present

## 2013-01-16 DIAGNOSIS — I10 Essential (primary) hypertension: Secondary | ICD-10-CM | POA: Diagnosis not present

## 2013-01-16 DIAGNOSIS — G039 Meningitis, unspecified: Secondary | ICD-10-CM | POA: Diagnosis not present

## 2013-01-16 DIAGNOSIS — M961 Postlaminectomy syndrome, not elsewhere classified: Secondary | ICD-10-CM | POA: Insufficient documentation

## 2013-01-16 DIAGNOSIS — Z981 Arthrodesis status: Secondary | ICD-10-CM | POA: Insufficient documentation

## 2013-01-16 MED ORDER — OXYCODONE HCL 15 MG PO TABS: 15.0000 mg | ORAL_TABLET | Freq: Three times a day (TID) | ORAL | Status: DC | PRN

## 2013-01-16 MED ORDER — OXYMORPHONE HCL ER 40 MG PO TB12: 40.0000 mg | ORAL_TABLET | Freq: Two times a day (BID) | ORAL | Status: DC

## 2013-01-16 NOTE — Patient Instructions (Addendum)
Continue with your exercises in the pool, with somebody else to watch you

## 2013-01-16 NOTE — Progress Notes (Signed)
Subjective:    Patient ID: [name] [name], female    DOB: 1950/12/11, 62 y.o.   MRN: [id number]  HPI The patient complains about chronic neck pain which radiates into back of her head and into her posterior right arm. The patient also complains about intermittend numbness and tingling between her shoulder blades. These symptoms had resolved after surgery, but they are starting to slowly come back since last month.  She had a MRI, which showed multifactorial stenosis at C4-5 above her fusion C5-6. She states, that she had surgery on 08/09/2012, ACDF C4-5, by Dr. Jeral Fruit.  She reports that the Roxicodone are not giving her that much relief anymore. She also states, that she has a diagnosis of depression and anxiety, but she feels that she does not really have anxiety nor depression.  The patient reports that she saw Dr. Jeral Fruit last month, who sent her to ENT for her ongoing hoarseness and the feeling of constriction in her throat after her ACDF. She saw an ENT, who told her that her voice box is not completely closed, which should get better in time after the ACDF.  Pain Inventory Average Pain 8 Pain Right Now 9 My pain is sharp, burning, stabbing, tingling and aching  In the last 24 hours, has pain interfered with the following? General activity 9 Relation with others 9 Enjoyment of life 9 What TIME of day is your pain at its worst? constant Sleep (in general) Poor  Pain is worse with: walking, sitting, standing and some activites Pain improves with: heat/ice, medication and TENS Relief from Meds: 6  Mobility use a cane ability to climb steps?  yes do you drive?  yes  Function not employed: date last employed .  Neuro/Psych weakness numbness tingling trouble walking spasms dizziness  Prior Studies Any changes since last visit?  no  Physicians involved in your care Any changes since last visit?  no   Family History  Problem Relation Age of Onset  . Hypertension Mother    . Heart disease Mother   . Diabetes Mother   . Cancer Father   . Diabetes Brother    History   Social History  . Marital Status: Legally Separated    Spouse Name: N/A    Number of Children: N/A  . Years of Education: N/A   Occupational History  . disable    Social History Main Topics  . Smoking status: Never Smoker   . Smokeless tobacco: Never Used  . Alcohol Use: No  . Drug Use: No  . Sexually Active: None   Other Topics Concern  . None   Social History Narrative   Has one child   Disabled   Past Surgical History  Procedure Laterality Date  . Lumbar fusion  11-30-10    L4 - 5  . Exploration of incision for csf leak  DEC 2011  X2    POST LAMINECOTMY  . Lumbar laminectomy  DEC 2011    L4 - 5  . Lumbar fusion  2000    L2 - 4  . Cervical disc surgery  2005    C5 - 6  . Anterior fusion cervical spine  2007    C5 -6  . Tendon repair  JAN 2010    LEFT INDEX AND LONG FINGERS  . Left wrist tenosynectomy w/ left thumb joint repair  02-24-10  . Abdominal hysterectomy  1987    W/ BSO  . Cholecystectomy  1994  . Knee arthroscopy  LEFT  X3 (LAST ONE 2005)  . Carpal tunnel release  RIGHT - 2001  & DEC 2011 W/ BACK SURG.  . Knee arthroscopy  05/17/2011    Procedure: ARTHROSCOPY KNEE;  Surgeon: Curlene Labrum III;  Location: [facility];  Service: Orthopedics;  Laterality: Right;  WITH MEDIAL MENISECTOMY AND removal of suprapatella fat lump  . Anterior cervical decomp/discectomy fusion N/A 08/09/2012    Procedure: ANTERIOR CERVICAL DECOMPRESSION/DISCECTOMY FUSION 1 LEVEL;  Surgeon: Karn Cassis, MD;  Location: [facility];  Service: Neurosurgery;  Laterality: N/A;  Cervical four-five  Anterior cervical decompression/diskectomy/fusion   Past Medical History  Diagnosis Date  . Osteoporosis   . Varicosities     venous  . Cardiomyopathy HX --06/2010    EF was 25% during acute illness (PHELONEPHRITIS) Repeat echo 12-06-10 60% showed normal EF.   Marland Kitchen  Hypertension   . Headache(784.0)   . PONV (postoperative nausea and vomiting)   . Heart murmur     DENIES S & S   (ECHO JUN'12 W/ CHART)  . History of chronic bronchitis   . Chronic back pain greater than 3 months duration     S/P BACK SURG'S  . Arachnoiditis BILATERAL LEGS    DUE TO MULTIPLE BACK SURG.'S  . Weakness of both legs DUE TO ARACHNOIDITIS    OCCASIONAL USES CANE  . Blood transfusion   . GERD (gastroesophageal reflux disease) AND HIATIAL HERNIA    CONTROLLED W/ NEXIUM  . Acute sinusitis, unspecified   . Anxiety state, unspecified   . Essential hypertension, benign   . Dyslipidemia   . Other malaise and fatigue   . Murmur, heart   . Shortness of breath   . Neuromuscular disorder     numbness and tingling  . Arthritis   . Hx of bladder infections    BP 135/56  Pulse 85  Resp 14  Ht 5\' 3"  (1.6 m)  Wt 163 lb (73.936 kg)  BMI 28.88 kg/m2  SpO2 98%     Review of Systems  HENT: Positive for neck pain.   Gastrointestinal: Positive for vomiting and diarrhea.  Musculoskeletal: Positive for myalgias, back pain, arthralgias and gait problem.  Neurological: Positive for dizziness, weakness and numbness.  All other systems reviewed and are negative.       Objective:   Physical Exam Constitutional: She is oriented to person, place, and time. She appears well-developed and well-nourished.  HENT:  Head: Normocephalic.  Musculoskeletal: She exhibits tenderness.  Neurological: She is alert and oriented to person, place, and time.  Skin: Skin is warm and dry.  Psychiatric: She has a normal mood and affect.  Symmetric normal motor tone is noted throughout. Normal muscle bulk. Muscle testing reveals 5/5 muscle strength of the right upper extremity, left not tested today due to pain after a fall, and 5/5 of the lower extremity, some diffuse give away weakness and some diffuse tremor. Full range of motion in upper and lower extremities. ROM of spine is restricted, C-spine  ext 30 degrees, flex 45 degrees, rot 45 degrees to the right, 15 degrees to the left, restricted by pain. Fine motor movements are restricted in both hands, osteoarthritis and hand surgery.  DTR in the upper and lower extremity are present and symmetric 2+. No clonus is noted.  Patient arises from chair with mild difficulty. Narrow based gait with normal arm swing bilateral , able to walk on heels and toes . Tandem walk is stable. No pronator drift. Rhomberg negative.  Assessment & Plan:  1. 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 Increased severe pain and clicking sound with rotation to the left. CT-myelogram from Dec. 2013 showed :  1. 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 Jeral Fruit on 08/09/2012, slowly recovering. Developed some hoarseness and feeling of constriction in her throat, Dr. Jeral Fruit referred her to ENT, who told her that her voice box is not completely closed, which should get better over time, she is following up with him again in 3 month.  2. Stable fusion at C5-6.  2. Post lumbar post laminectomy syndrome.  3. 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 Legs sometimes give away. 4. 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 Left elbow pain with restricted ROM, x-ray of left elbow did not show significant findings, has resloved.  6. Allergies, Asthma, she will follow up with her pulmonologist today.  PLAN:  1. Discussed adjusting neurontin to take 900 mg qhs and 300mg  at breakfast and mid day. Patient did not tolerate the increased dose well, she states, that she is taking the gabapentin 600mg  tid  2. Spinal stimulator is still in the discussion for the arachnoiditis.  3. Continue with Opana ER to 40mg  q12 #60  4. Will continue oxy IR 15mg , # 90 for breakthrough.  Continue to focus  on appropriate posture as tolerated , continue with Aspen collar if needed.  5. Flexeril and valium prn for spasms  6.Continue with using your TENS unit, also continue with your aquatic exercises, with another person watching you.  Follow up with Dr. Riley Kill in one month. She wants to discuss with Dr. Riley Kill that the Roxicodone are not giving her that much relief anymore. Also that she has a diagnosis of depression and anxiety, but she feels that she does not really have anxiety nor depression.  All questions were encouraged and answered

## 2013-01-17 ENCOUNTER — Ambulatory Visit: Payer: Medicare Other

## 2013-01-17 ENCOUNTER — Encounter: Payer: Medicare Other | Admitting: Physical Medicine & Rehabilitation

## 2013-01-26 ENCOUNTER — Emergency Department (HOSPITAL_COMMUNITY): Payer: Medicare Other

## 2013-01-26 ENCOUNTER — Encounter (HOSPITAL_COMMUNITY): Payer: Self-pay | Admitting: Emergency Medicine

## 2013-01-26 ENCOUNTER — Emergency Department (HOSPITAL_COMMUNITY)
Admission: EM | Admit: 2013-01-26 | Discharge: 2013-01-26 | Disposition: A | Discharge status: Home / Self Care | Payer: Medicare Other | Attending: Emergency Medicine | Admitting: Emergency Medicine

## 2013-01-26 DIAGNOSIS — G8929 Other chronic pain: Secondary | ICD-10-CM | POA: Insufficient documentation

## 2013-01-26 DIAGNOSIS — M545 Low back pain, unspecified: Secondary | ICD-10-CM | POA: Diagnosis not present

## 2013-01-26 DIAGNOSIS — I10 Essential (primary) hypertension: Secondary | ICD-10-CM | POA: Diagnosis not present

## 2013-01-26 DIAGNOSIS — Z8669 Personal history of other diseases of the nervous system and sense organs: Secondary | ICD-10-CM | POA: Diagnosis not present

## 2013-01-26 DIAGNOSIS — R011 Cardiac murmur, unspecified: Secondary | ICD-10-CM | POA: Insufficient documentation

## 2013-01-26 DIAGNOSIS — Z79899 Other long term (current) drug therapy: Secondary | ICD-10-CM | POA: Insufficient documentation

## 2013-01-26 DIAGNOSIS — F411 Generalized anxiety disorder: Secondary | ICD-10-CM | POA: Insufficient documentation

## 2013-01-26 DIAGNOSIS — M542 Cervicalgia: Secondary | ICD-10-CM

## 2013-01-26 DIAGNOSIS — Z8679 Personal history of other diseases of the circulatory system: Secondary | ICD-10-CM | POA: Insufficient documentation

## 2013-01-26 DIAGNOSIS — S199XXA Unspecified injury of neck, initial encounter: Secondary | ICD-10-CM | POA: Diagnosis not present

## 2013-01-26 DIAGNOSIS — G8911 Acute pain due to trauma: Secondary | ICD-10-CM | POA: Insufficient documentation

## 2013-01-26 DIAGNOSIS — Z91040 Latex allergy status: Secondary | ICD-10-CM | POA: Diagnosis not present

## 2013-01-26 DIAGNOSIS — Z8742 Personal history of other diseases of the female genital tract: Secondary | ICD-10-CM | POA: Diagnosis not present

## 2013-01-26 DIAGNOSIS — IMO0001 Reserved for inherently not codable concepts without codable children: Secondary | ICD-10-CM | POA: Insufficient documentation

## 2013-01-26 DIAGNOSIS — Z87440 Personal history of urinary (tract) infections: Secondary | ICD-10-CM | POA: Diagnosis not present

## 2013-01-26 DIAGNOSIS — M129 Arthropathy, unspecified: Secondary | ICD-10-CM | POA: Diagnosis not present

## 2013-01-26 DIAGNOSIS — Z862 Personal history of diseases of the blood and blood-forming organs and certain disorders involving the immune mechanism: Secondary | ICD-10-CM | POA: Insufficient documentation

## 2013-01-26 DIAGNOSIS — K219 Gastro-esophageal reflux disease without esophagitis: Secondary | ICD-10-CM | POA: Diagnosis not present

## 2013-01-26 DIAGNOSIS — M47817 Spondylosis without myelopathy or radiculopathy, lumbosacral region: Secondary | ICD-10-CM | POA: Diagnosis not present

## 2013-01-26 DIAGNOSIS — Z8709 Personal history of other diseases of the respiratory system: Secondary | ICD-10-CM | POA: Insufficient documentation

## 2013-01-26 DIAGNOSIS — Z8639 Personal history of other endocrine, nutritional and metabolic disease: Secondary | ICD-10-CM | POA: Insufficient documentation

## 2013-01-26 DIAGNOSIS — IMO0002 Reserved for concepts with insufficient information to code with codable children: Secondary | ICD-10-CM | POA: Diagnosis not present

## 2013-01-26 NOTE — ED Notes (Signed)
Discharge instructions reviewed. Pt verbalized understanding.  
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
## 2013-01-26 NOTE — ED Provider Notes (Deleted)
Medical screening examination/treatment/procedure(s) were conducted as a shared visit with non-physician practitioner(s) and myself.  I personally evaluated the patient during the encounter   Darlys Gales, MD 01/26/13 1415
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
## 2013-01-26 NOTE — ED Provider Notes (Addendum)
Medical screening examination/treatment/procedure(s) were conducted as a shared visit with non-physician practitioner(s) and myself.  I personally evaluated the patient during the encounter.    Discussed treatment options with pt at length - she opted for continued treatment at Pain Management and did not want any pain medications at time of visit.  No evidence of emergent etiology at this time.  VSS and she was in NAD. Images and PE do not reveal fracture.  The pt was relieved with the findings and w/o question at time of discharge.      Darlys Gales, MD 01/26/13 1610  Darlys Gales, MD 02/09/13 [phone]

## 2013-01-26 NOTE — ED Provider Notes (Deleted)
Medical screening examination/treatment/procedure(s) were conducted as a shared visit with non-physician practitioner(s) and myself.  I personally evaluated the patient during the encounter   Darlys Gales, MD 01/26/13 1435

## 2013-02-04 DIAGNOSIS — L989 Disorder of the skin and subcutaneous tissue, unspecified: Secondary | ICD-10-CM | POA: Diagnosis not present

## 2013-02-04 DIAGNOSIS — L723 Sebaceous cyst: Secondary | ICD-10-CM | POA: Diagnosis not present

## 2013-02-04 DIAGNOSIS — K135 Oral submucous fibrosis: Secondary | ICD-10-CM | POA: Diagnosis not present

## 2013-02-10 ENCOUNTER — Telehealth: Payer: Self-pay

## 2013-02-10 MED ORDER — OXYMORPHONE HCL ER 40 MG PO TB12: 40.0000 mg | ORAL_TABLET | Freq: Two times a day (BID) | ORAL | Status: DC

## 2013-02-10 NOTE — Telephone Encounter (Signed)
Patient appointment not until 02/19/2013.  She only needs opana filled at this time.  She would like to see Dr Riley Kill only.  Her Husband-Jack will pick up script Friday.  Script printed.  Patient aware.

## 2013-02-18 ENCOUNTER — Encounter: Payer: Self-pay | Admitting: Physical Medicine & Rehabilitation

## 2013-02-18 ENCOUNTER — Encounter: Payer: Medicare Other | Attending: Physical Medicine and Rehabilitation | Admitting: Physical Medicine & Rehabilitation

## 2013-02-18 VITALS — BP 142/75 | HR 68 | Resp 14 | Ht 63.0 in | Wt 162.0 lb

## 2013-02-18 DIAGNOSIS — G039 Meningitis, unspecified: Secondary | ICD-10-CM | POA: Insufficient documentation

## 2013-02-18 DIAGNOSIS — M961 Postlaminectomy syndrome, not elsewhere classified: Secondary | ICD-10-CM | POA: Diagnosis not present

## 2013-02-18 DIAGNOSIS — X58XXXA Exposure to other specified factors, initial encounter: Secondary | ICD-10-CM | POA: Insufficient documentation

## 2013-02-18 DIAGNOSIS — M5412 Radiculopathy, cervical region: Secondary | ICD-10-CM | POA: Diagnosis not present

## 2013-02-18 MED ORDER — PREDNISONE 20 MG PO TABS: 20.0000 mg | ORAL_TABLET | Freq: Every day | ORAL | Status: DC

## 2013-02-18 MED ORDER — AMITRIPTYLINE HCL 25 MG PO TABS: 25.0000 mg | ORAL_TABLET | Freq: Every day | ORAL | Status: DC

## 2013-02-18 MED ORDER — OXYCODONE HCL 15 MG PO TABS: 15.0000 mg | ORAL_TABLET | Freq: Three times a day (TID) | ORAL | Status: DC | PRN

## 2013-02-18 MED ORDER — OXYMORPHONE HCL ER 40 MG PO TB12: 40.0000 mg | ORAL_TABLET | Freq: Two times a day (BID) | ORAL | Status: DC
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
## 2013-02-18 NOTE — Patient Instructions (Signed)
CALL ME WITH ANY PROBLEMS OR QUESTIONS (#[phone]).  HAVE A GOOD DAY  

## 2013-03-05 ENCOUNTER — Ambulatory Visit
Admission: RE | Admit: 2013-03-05 | Discharge: 2013-03-05 | Disposition: A | Discharge status: Home / Self Care | Payer: Medicare Other | Source: Ambulatory Visit | Attending: Physical Medicine & Rehabilitation | Admitting: Physical Medicine & Rehabilitation

## 2013-03-05 ENCOUNTER — Telehealth: Payer: Self-pay | Admitting: Physical Medicine & Rehabilitation

## 2013-03-05 DIAGNOSIS — M5126 Other intervertebral disc displacement, lumbar region: Secondary | ICD-10-CM | POA: Diagnosis not present

## 2013-03-05 DIAGNOSIS — G039 Meningitis, unspecified: Secondary | ICD-10-CM

## 2013-03-05 DIAGNOSIS — M47817 Spondylosis without myelopathy or radiculopathy, lumbosacral region: Secondary | ICD-10-CM | POA: Diagnosis not present

## 2013-03-05 DIAGNOSIS — M961 Postlaminectomy syndrome, not elsewhere classified: Secondary | ICD-10-CM

## 2013-03-05 MED ORDER — IOHEXOL 180 MG/ML  SOLN
18.0000 mL | Freq: Once | INTRAMUSCULAR | Status: AC | PRN
  Administered 2013-03-05: 18 mL via INTRATHECAL

## 2013-03-05 NOTE — Progress Notes (Signed)
Pt states she took her own pain meds and valium before coming in today.discharge instructions explained to pt.

## 2013-03-05 NOTE — Telephone Encounter (Signed)
I reviewed her CT myelogram and suspect that most of her symptoms are still coming from her chronic arachnoiditis/nerve clumping. I still think it would be worthwhile for her back surgeon to take a look at this as well.

## 2013-03-07 NOTE — Telephone Encounter (Signed)
Spoke with [name] [name] and gave her the message from Dr Riley Kill.  She will call Dr Cassandria Santee office.

## 2013-03-07 NOTE — Telephone Encounter (Signed)
[place] visiting with niece.  Can call her cell #  [phone]

## 2013-03-07 NOTE — Telephone Encounter (Signed)
Left message for patient to call office regarding her myelogram results.

## 2013-03-13 ENCOUNTER — Other Ambulatory Visit: Payer: Self-pay | Admitting: Orthopedic Surgery

## 2013-04-01 ENCOUNTER — Other Ambulatory Visit: Payer: Self-pay

## 2013-04-01 ENCOUNTER — Encounter (HOSPITAL_BASED_OUTPATIENT_CLINIC_OR_DEPARTMENT_OTHER): Payer: Self-pay | Admitting: *Deleted

## 2013-04-01 ENCOUNTER — Encounter (HOSPITAL_BASED_OUTPATIENT_CLINIC_OR_DEPARTMENT_OTHER)
Admission: RE | Admit: 2013-04-01 | Discharge: 2013-04-01 | Disposition: A | Discharge status: Home / Self Care | Payer: Medicare Other | Source: Ambulatory Visit | Attending: Orthopedic Surgery | Admitting: Orthopedic Surgery

## 2013-04-01 ENCOUNTER — Encounter: Payer: Self-pay | Admitting: Physical Medicine & Rehabilitation

## 2013-04-01 ENCOUNTER — Encounter: Payer: Medicare Other | Attending: Physical Medicine and Rehabilitation | Admitting: Physical Medicine & Rehabilitation

## 2013-04-01 VITALS — BP 159/78 | HR 82 | Resp 14 | Ht 63.0 in | Wt 163.0 lb

## 2013-04-01 DIAGNOSIS — Z01812 Encounter for preprocedural laboratory examination: Secondary | ICD-10-CM | POA: Insufficient documentation

## 2013-04-01 DIAGNOSIS — F418 Other specified anxiety disorders: Secondary | ICD-10-CM

## 2013-04-01 DIAGNOSIS — Z5181 Encounter for therapeutic drug level monitoring: Secondary | ICD-10-CM

## 2013-04-01 DIAGNOSIS — R1310 Dysphagia, unspecified: Secondary | ICD-10-CM | POA: Diagnosis not present

## 2013-04-01 DIAGNOSIS — M5412 Radiculopathy, cervical region: Secondary | ICD-10-CM | POA: Diagnosis not present

## 2013-04-01 DIAGNOSIS — Z79899 Other long term (current) drug therapy: Secondary | ICD-10-CM

## 2013-04-01 DIAGNOSIS — X58XXXA Exposure to other specified factors, initial encounter: Secondary | ICD-10-CM | POA: Insufficient documentation

## 2013-04-01 DIAGNOSIS — J383 Other diseases of vocal cords: Secondary | ICD-10-CM | POA: Diagnosis not present

## 2013-04-01 DIAGNOSIS — M961 Postlaminectomy syndrome, not elsewhere classified: Secondary | ICD-10-CM | POA: Diagnosis not present

## 2013-04-01 DIAGNOSIS — G039 Meningitis, unspecified: Secondary | ICD-10-CM

## 2013-04-01 DIAGNOSIS — R490 Dysphonia: Secondary | ICD-10-CM | POA: Diagnosis not present

## 2013-04-01 DIAGNOSIS — F341 Dysthymic disorder: Secondary | ICD-10-CM

## 2013-04-01 DIAGNOSIS — Z01810 Encounter for preprocedural cardiovascular examination: Secondary | ICD-10-CM | POA: Diagnosis not present

## 2013-04-01 LAB — BASIC METABOLIC PANEL
BUN: 9 mg/dL (ref 6–23)
CO2: 33 mEq/L — ABNORMAL HIGH (ref 19–32)
Chloride: 99 mEq/L (ref 96–112)
Creatinine, Ser: 0.55 mg/dL (ref 0.50–1.10)

## 2013-04-01 MED ORDER — OXYMORPHONE HCL ER 40 MG PO TB12: 40.0000 mg | ORAL_TABLET | Freq: Two times a day (BID) | ORAL | Status: DC

## 2013-04-01 MED ORDER — AMITRIPTYLINE HCL 50 MG PO TABS: 50.0000 mg | ORAL_TABLET | Freq: Every day | ORAL | Status: DC

## 2013-04-01 MED ORDER — OXYCODONE HCL 15 MG PO TABS: 15.0000 mg | ORAL_TABLET | Freq: Four times a day (QID) | ORAL | Status: DC | PRN

## 2013-04-01 NOTE — Progress Notes (Signed)
Pt saw dr bates today-he said her neck would be fine with LMA or MAC

## 2013-04-01 NOTE — Progress Notes (Signed)
Subjective:    Patient ID: [name] [name], female    DOB: [birth year], 62 y.o.   MRN: [id number]  HPI  [name] [name] is back regarding her chronic back pain. We sent her for a CT myelogram which showed:  Solid appearing fusion from L2 through L5. Pronounced arachnoiditis  pattern. Old augmented fracture at L1. No stenosis. Slight rocking  motion at the L1-2 level with flexion and extension.  She feels that her balance continues to be a problem along with the pain. She uses a cane when she goes out of the house.  The elavil helped her sleep but it's only lasting about 4 hours and then she wakes up.    Pain Inventory Average Pain 9 Pain Right Now 10 My pain is sharp, burning, tingling and aching  In the last 24 hours, has pain interfered with the following? General activity 10 Relation with others 10 Enjoyment of life 10 What TIME of day is your pain at its worst? constant Sleep (in general) Poor  Pain is worse with: walking, bending, sitting, standing and some activites Pain improves with: rest, heat/ice and medication Relief from Meds: 7  Mobility walk with assistance use a cane how many minutes can you walk? 5 ability to climb steps?  yes do you drive?  yes  Function I need assistance with the following:  household duties and shopping  Neuro/Psych trouble walking  Prior Studies myelogram  Physicians involved in your care Dairl Ponder   Family History  Problem Relation Age of Onset  . Hypertension Mother   . Heart disease Mother   . Diabetes Mother   . Cancer Father   . Diabetes Brother    History   Social History  . Marital Status: Legally Separated    Spouse Name: N/A    Number of Children: N/A  . Years of Education: N/A   Occupational History  . disable    Social History Main Topics  . Smoking status: Never Smoker   . Smokeless tobacco: Never Used  . Alcohol Use: No  . Drug Use: No  . Sexual Activity: None   Other Topics Concern  . None    Social History Narrative   Has one child   Disabled   Past Surgical History  Procedure Laterality Date  . Lumbar fusion  11-30-10    L4 - 5  . Exploration of incision for csf leak  DEC 2011  X2    POST LAMINECOTMY  . Lumbar laminectomy  DEC 2011    L4 - 5  . Lumbar fusion  2000    L2 - 4  . Cervical disc surgery  2005    C5 - 6  . Anterior fusion cervical spine  2007    C5 -6  . Tendon repair  JAN 2010    LEFT INDEX AND LONG FINGERS  . Left wrist tenosynectomy w/ left thumb joint repair  02-24-10  . Abdominal hysterectomy  1987    W/ BSO  . Cholecystectomy  1994  . Knee arthroscopy  LEFT X3 (LAST ONE 2005)  . Carpal tunnel release  RIGHT - 2001  & DEC 2011 W/ BACK SURG.  . Knee arthroscopy  05/17/2011    Procedure: ARTHROSCOPY KNEE;  Surgeon: Curlene Labrum III;  Location: [facility];  Service: Orthopedics;  Laterality: Right;  WITH MEDIAL MENISECTOMY AND removal of suprapatella fat lump  . Anterior cervical decomp/discectomy fusion N/A 08/09/2012    Procedure: ANTERIOR CERVICAL DECOMPRESSION/DISCECTOMY FUSION 1  LEVEL;  Surgeon: Karn Cassis, MD;  Location: [facility];  Service: Neurosurgery;  Laterality: N/A;  Cervical four-five  Anterior cervical decompression/diskectomy/fusion   Past Medical History  Diagnosis Date  . Osteoporosis   . Varicosities     venous  . Cardiomyopathy HX --06/2010    EF was 25% during acute illness (PHELONEPHRITIS) Repeat echo 12-06-10 60% showed normal EF.   Marland Kitchen Hypertension   . Headache(784.0)   . PONV (postoperative nausea and vomiting)   . Heart murmur     DENIES S & S   (ECHO JUN'12 W/ CHART)  . History of chronic bronchitis   . Chronic back pain greater than 3 months duration     S/P BACK SURG'S  . Arachnoiditis BILATERAL LEGS    DUE TO MULTIPLE BACK SURG.'S  . Weakness of both legs DUE TO ARACHNOIDITIS    OCCASIONAL USES CANE  . Blood transfusion   . GERD (gastroesophageal reflux disease) AND HIATIAL HERNIA     CONTROLLED W/ NEXIUM  . Acute sinusitis, unspecified   . Anxiety state, unspecified   . Essential hypertension, benign   . Dyslipidemia   . Other malaise and fatigue   . Murmur, heart   . Shortness of breath   . Neuromuscular disorder     numbness and tingling  . Arthritis   . Hx of bladder infections    BP 159/78  Pulse 82  Resp 14  Ht 5\' 3"  (1.6 m)  Wt 163 lb (73.936 kg)  BMI 28.88 kg/m2  SpO2 96%     Review of Systems  Musculoskeletal: Positive for back pain and gait problem.  All other systems reviewed and are negative.       Objective:   Physical Exam  Constitutional: She is oriented to person, place, and time. She appears well-developed and well-nourished.  HENT:  Head: Normocephalic and atraumatic.  Eyes: Conjunctivae and EOM are normal. Pupils are equal, round, and reactive to light.  Neck: Normal range of motion. Neck supple.  Cardiovascular: Normal rate and regular rhythm.  Pulmonary/Chest: Effort normal and breath sounds normal.  Abdominal: Soft. Bowel sounds are normal. She exhibits no distension.  Musculoskeletal:  Cervical back: She exhibits decreased range of motion and tenderness.  Lumbar back: She exhibits decreased range of motion, tenderness, bony tenderness and spasm. She can bend at the waist to approximately 45 degrees before she begins to hurt. Neurological: She is alert and oriented to person, place, and time. No cranial nerve deficit.  She has some subjective weakness in the upper extremities. Strength grossly 4/5 in UE's. She has more subjective weakness with ADF and APF bilaterally. Her right leg trembles when she stands. She uses a cane for support. No consistent or focal sensory findings are seen in the arms. Lower extremities continues to show some mild sensory loss at 1-1+ out of 2, particularly below the knees. Inconsistent weakness in the legs with pain inhibition noted. Reflexes are generally 1+ throughout. Gait is normal   Psychiatric: She has a normal mood and affect. Her behavior is normal. Judgment and thought content normal.   Assessment & Plan:   ASSESSMENT:  1. 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 She has documented C6 radiculitis in the past. I still don't see any definitive signs of acute radiculopathy.  2. Post lumbar post laminectomy syndrome.  3. 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  4. Anxiety/depression secondary  to the above     PLAN:  1. Continue gabapentin 600mg  TID for nerve.  2. Spinal stimulator was discussed again today. Discussed the importance of regular stretching of her back, pelvis, and LE's.  3. Continue with Opana ER to 40mg  q12 #60  4. Will continue oxy IR for breakthrough. Will alow her to have these q6 prn #120 5. Will order a CT myelogram to follow up lumbar region, considering worsening pain, loss of balance, sensory loss,etc  6. Increase elavil 50mg  qhs  7. Flexeril and valium prn for spasms  8. The patient will see me in one month. All questions were encouraged and answered.

## 2013-04-01 NOTE — Patient Instructions (Signed)
CONTINUE TO WORK ON STRETCHING AND RANGE OF MOTION FOR YOUR BACK, LEGS, AND PELVIS

## 2013-04-01 NOTE — Progress Notes (Signed)
Pt has had multiple surgeies-chronic pain-will take all her baseline meds-ekg and bmet done-difficult intubation from cervical surgery-

## 2013-04-07 ENCOUNTER — Ambulatory Visit (HOSPITAL_BASED_OUTPATIENT_CLINIC_OR_DEPARTMENT_OTHER): Payer: Medicare Other | Admitting: Certified Registered Nurse Anesthetist

## 2013-04-07 ENCOUNTER — Encounter (HOSPITAL_BASED_OUTPATIENT_CLINIC_OR_DEPARTMENT_OTHER): Payer: Self-pay | Admitting: *Deleted

## 2013-04-07 ENCOUNTER — Encounter (HOSPITAL_BASED_OUTPATIENT_CLINIC_OR_DEPARTMENT_OTHER)
Admission: RE | Disposition: A | Discharge status: Home / Self Care | Payer: Self-pay | Source: Ambulatory Visit | Attending: Orthopedic Surgery

## 2013-04-07 ENCOUNTER — Encounter (HOSPITAL_BASED_OUTPATIENT_CLINIC_OR_DEPARTMENT_OTHER): Payer: Medicare Other | Admitting: Certified Registered Nurse Anesthetist

## 2013-04-07 ENCOUNTER — Ambulatory Visit (HOSPITAL_BASED_OUTPATIENT_CLINIC_OR_DEPARTMENT_OTHER)
Admission: RE | Admit: 2013-04-07 | Discharge: 2013-04-07 | Disposition: A | Discharge status: Home / Self Care | Payer: Medicare Other | Source: Ambulatory Visit | Attending: Orthopedic Surgery | Admitting: Orthopedic Surgery

## 2013-04-07 DIAGNOSIS — G8929 Other chronic pain: Secondary | ICD-10-CM | POA: Insufficient documentation

## 2013-04-07 DIAGNOSIS — E785 Hyperlipidemia, unspecified: Secondary | ICD-10-CM | POA: Insufficient documentation

## 2013-04-07 DIAGNOSIS — G8918 Other acute postprocedural pain: Secondary | ICD-10-CM | POA: Diagnosis not present

## 2013-04-07 DIAGNOSIS — M654 Radial styloid tenosynovitis [de Quervain]: Secondary | ICD-10-CM | POA: Diagnosis not present

## 2013-04-07 DIAGNOSIS — M810 Age-related osteoporosis without current pathological fracture: Secondary | ICD-10-CM | POA: Insufficient documentation

## 2013-04-07 DIAGNOSIS — Z79899 Other long term (current) drug therapy: Secondary | ICD-10-CM | POA: Diagnosis not present

## 2013-04-07 DIAGNOSIS — I10 Essential (primary) hypertension: Secondary | ICD-10-CM | POA: Insufficient documentation

## 2013-04-07 DIAGNOSIS — M65839 Other synovitis and tenosynovitis, unspecified forearm: Secondary | ICD-10-CM | POA: Diagnosis not present

## 2013-04-07 DIAGNOSIS — F411 Generalized anxiety disorder: Secondary | ICD-10-CM | POA: Diagnosis not present

## 2013-04-07 DIAGNOSIS — I428 Other cardiomyopathies: Secondary | ICD-10-CM | POA: Insufficient documentation

## 2013-04-07 DIAGNOSIS — Z7982 Long term (current) use of aspirin: Secondary | ICD-10-CM | POA: Diagnosis not present

## 2013-04-07 DIAGNOSIS — J4489 Other specified chronic obstructive pulmonary disease: Secondary | ICD-10-CM | POA: Insufficient documentation

## 2013-04-07 DIAGNOSIS — K219 Gastro-esophageal reflux disease without esophagitis: Secondary | ICD-10-CM | POA: Diagnosis not present

## 2013-04-07 DIAGNOSIS — Z01812 Encounter for preprocedural laboratory examination: Secondary | ICD-10-CM | POA: Insufficient documentation

## 2013-04-07 DIAGNOSIS — Z01810 Encounter for preprocedural cardiovascular examination: Secondary | ICD-10-CM | POA: Diagnosis not present

## 2013-04-07 DIAGNOSIS — M159 Polyosteoarthritis, unspecified: Secondary | ICD-10-CM | POA: Diagnosis not present

## 2013-04-07 DIAGNOSIS — I8390 Asymptomatic varicose veins of unspecified lower extremity: Secondary | ICD-10-CM | POA: Insufficient documentation

## 2013-04-07 DIAGNOSIS — J449 Chronic obstructive pulmonary disease, unspecified: Secondary | ICD-10-CM | POA: Diagnosis not present

## 2013-04-07 DIAGNOSIS — M19049 Primary osteoarthritis, unspecified hand: Secondary | ICD-10-CM | POA: Insufficient documentation

## 2013-04-07 DIAGNOSIS — M189 Osteoarthritis of first carpometacarpal joint, unspecified: Secondary | ICD-10-CM

## 2013-04-07 DIAGNOSIS — M549 Dorsalgia, unspecified: Secondary | ICD-10-CM | POA: Diagnosis not present

## 2013-04-07 HISTORY — PX: FINGER ARTHRODESIS: SHX5000

## 2013-04-07 HISTORY — PX: GANGLION CYST EXCISION: SHX1691

## 2013-04-07 HISTORY — PX: FINGER ARTHROPLASTY: SHX5017

## 2013-04-07 HISTORY — DX: Dysphagia, unspecified: R13.10

## 2013-04-07 HISTORY — DX: Unspecified asthma, uncomplicated: J45.909

## 2013-04-07 HISTORY — PX: DORSAL COMPARTMENT RELEASE: SHX5039

## 2013-04-07 LAB — POCT HEMOGLOBIN-HEMACUE: Hemoglobin: 13 g/dL (ref 12.0–15.0)

## 2013-04-07 SURGERY — FUSION, JOINT, FINGER
Anesthesia: General | Site: Wrist | Laterality: Right | Wound class: Clean

## 2013-04-07 MED ORDER — LIDOCAINE HCL (PF) 1 % IJ SOLN
INTRAMUSCULAR | Status: AC
  Filled 2013-04-07: qty 30

## 2013-04-07 MED ORDER — BUPIVACAINE HCL (PF) 0.25 % IJ SOLN
INTRAMUSCULAR | Status: AC
  Filled 2013-04-07: qty 30

## 2013-04-07 MED ORDER — CHLORHEXIDINE GLUCONATE 4 % EX LIQD: 60.0000 mL | Freq: Once | CUTANEOUS | Status: DC

## 2013-04-07 MED ORDER — DEXAMETHASONE SODIUM PHOSPHATE 10 MG/ML IJ SOLN
INTRAMUSCULAR | Status: DC | PRN
  Administered 2013-04-07: 10 mg via INTRAVENOUS

## 2013-04-07 MED ORDER — PROPOFOL 10 MG/ML IV BOLUS
INTRAVENOUS | Status: DC | PRN
  Administered 2013-04-07: 160 mg via INTRAVENOUS

## 2013-04-07 MED ORDER — ONDANSETRON HCL 4 MG/2ML IJ SOLN: 4.0000 mg | Freq: Once | INTRAMUSCULAR | Status: DC | PRN

## 2013-04-07 MED ORDER — PROMETHAZINE HCL 12.5 MG PO TABS: 12.5000 mg | ORAL_TABLET | Freq: Four times a day (QID) | ORAL | Status: DC | PRN

## 2013-04-07 MED ORDER — FENTANYL CITRATE 0.05 MG/ML IJ SOLN
50.0000 ug | INTRAMUSCULAR | Status: DC | PRN
  Administered 2013-04-07: 100 ug via INTRAVENOUS

## 2013-04-07 MED ORDER — FENTANYL CITRATE 0.05 MG/ML IJ SOLN
INTRAMUSCULAR | Status: AC
  Filled 2013-04-07: qty 2

## 2013-04-07 MED ORDER — FENTANYL CITRATE 0.05 MG/ML IJ SOLN
INTRAMUSCULAR | Status: AC
  Filled 2013-04-07: qty 4

## 2013-04-07 MED ORDER — MIDAZOLAM HCL 2 MG/2ML IJ SOLN
INTRAMUSCULAR | Status: AC
  Filled 2013-04-07: qty 2

## 2013-04-07 MED ORDER — MIDAZOLAM HCL 2 MG/2ML IJ SOLN
1.0000 mg | INTRAMUSCULAR | Status: DC | PRN
  Administered 2013-04-07: 2 mg via INTRAVENOUS

## 2013-04-07 MED ORDER — KETOROLAC TROMETHAMINE 30 MG/ML IJ SOLN: 15.0000 mg | Freq: Once | INTRAMUSCULAR | Status: DC | PRN

## 2013-04-07 MED ORDER — CEFAZOLIN SODIUM-DEXTROSE 2-3 GM-% IV SOLR
2.0000 g | INTRAVENOUS | Status: AC
  Administered 2013-04-07: 2 g via INTRAVENOUS

## 2013-04-07 MED ORDER — PROPOFOL 10 MG/ML IV BOLUS
INTRAVENOUS | Status: AC
  Filled 2013-04-07: qty 20

## 2013-04-07 MED ORDER — LACTATED RINGERS IV SOLN
INTRAVENOUS | Status: DC
  Administered 2013-04-07 (×2): via INTRAVENOUS

## 2013-04-07 MED ORDER — LIDOCAINE HCL (CARDIAC) 20 MG/ML IV SOLN
INTRAVENOUS | Status: DC | PRN
  Administered 2013-04-07: 10 mg via INTRAVENOUS

## 2013-04-07 MED ORDER — ONDANSETRON HCL 4 MG/2ML IJ SOLN
INTRAMUSCULAR | Status: DC | PRN
  Administered 2013-04-07: 4 mg via INTRAVENOUS

## 2013-04-07 MED ORDER — FENTANYL CITRATE 0.05 MG/ML IJ SOLN: 25.0000 ug | INTRAMUSCULAR | Status: DC | PRN

## 2013-04-07 MED ORDER — CEFAZOLIN SODIUM-DEXTROSE 2-3 GM-% IV SOLR
INTRAVENOUS | Status: AC
  Filled 2013-04-07: qty 50

## 2013-04-07 SURGICAL SUPPLY — 96 items
.86 K WIRE ×6 IMPLANT
APL SKNCLS STERI-STRIP NONHPOA (GAUZE/BANDAGES/DRESSINGS)
BAG DECANTER FOR FLEXI CONT (MISCELLANEOUS) IMPLANT
BANDAGE CONFORM 2  STR LF (GAUZE/BANDAGES/DRESSINGS) ×3 IMPLANT
BANDAGE ELASTIC 3 VELCRO ST LF (GAUZE/BANDAGES/DRESSINGS) ×3 IMPLANT
BANDAGE ELASTIC 4 VELCRO ST LF (GAUZE/BANDAGES/DRESSINGS) ×3 IMPLANT
BANDAGE GAUZE ELAST BULKY 4 IN (GAUZE/BANDAGES/DRESSINGS) ×3 IMPLANT
BENZOIN TINCTURE PRP APPL 2/3 (GAUZE/BANDAGES/DRESSINGS) IMPLANT
BIT DRILL 2.2 CANN STRGHT (BIT) ×3 IMPLANT
BIT DRILL MICRO PROFILE (BIT) ×3 IMPLANT
BLADE AVERAGE 25X9 (BLADE) IMPLANT
BLADE MINI RND TIP GREEN BEAV (BLADE) IMPLANT
BLADE SURG 15 STRL LF DISP TIS (BLADE) ×2 IMPLANT
BLADE SURG 15 STRL SS (BLADE) ×3
BNDG CMPR 9X4 STRL LF SNTH (GAUZE/BANDAGES/DRESSINGS) ×2
BNDG CMPR MD 5X2 ELC HKLP STRL (GAUZE/BANDAGES/DRESSINGS)
BNDG COHESIVE 1X5 TAN STRL LF (GAUZE/BANDAGES/DRESSINGS) ×3 IMPLANT
BNDG ELASTIC 2 VLCR STRL LF (GAUZE/BANDAGES/DRESSINGS) IMPLANT
BNDG ESMARK 4X9 LF (GAUZE/BANDAGES/DRESSINGS) ×3 IMPLANT
BUR FAST CUTTING MED (BURR) IMPLANT
BUR STRYKER (BURR) IMPLANT
BUR SWANSON PILOT POINT MED (BURR) IMPLANT
CANISTER SUCT 1200ML W/VALVE (MISCELLANEOUS) ×3 IMPLANT
CORDS BIPOLAR (ELECTRODE) ×3 IMPLANT
COVER TABLE BACK 60X90 (DRAPES) ×3 IMPLANT
CUFF TOURNIQUET SINGLE 18IN (TOURNIQUET CUFF) ×3 IMPLANT
DECANTER SPIKE VIAL GLASS SM (MISCELLANEOUS) IMPLANT
DISPOSIBLE KIT ×3 IMPLANT
DRAPE EXTREMITY T 121X128X90 (DRAPE) ×3 IMPLANT
DRAPE OEC MINIVIEW 54X84 (DRAPES) ×3 IMPLANT
DRAPE SURG 17X23 STRL (DRAPES) ×3 IMPLANT
DRSG PAD ABDOMINAL 8X10 ST (GAUZE/BANDAGES/DRESSINGS) IMPLANT
DURAPREP 26ML APPLICATOR (WOUND CARE) ×3 IMPLANT
GAUZE SPONGE 4X4 16PLY XRAY LF (GAUZE/BANDAGES/DRESSINGS) ×3 IMPLANT
GAUZE XEROFORM 1X8 LF (GAUZE/BANDAGES/DRESSINGS) ×3 IMPLANT
GLOVE BIO SURGEON STRL SZ8 (GLOVE) ×9 IMPLANT
GLOVE SURG SS PI 6.5 STRL IVOR (GLOVE) ×3 IMPLANT
GLOVE SURG SS PI 7.5 STRL IVOR (GLOVE) ×6 IMPLANT
GLOVE SURG SS PI 8.0 STRL IVOR (GLOVE) ×3 IMPLANT
GOWN BRE IMP PREV XXLGXLNG (GOWN DISPOSABLE) IMPLANT
GOWN PREVENTION PLUS XLARGE (GOWN DISPOSABLE) IMPLANT
NDL SAFETY ECLIPSE 18X1.5 (NEEDLE) IMPLANT
NEEDLE 27GAX1X1/2 (NEEDLE) IMPLANT
NEEDLE HYPO 18GX1.5 SHARP (NEEDLE)
NEEDLE HYPO 22GX1.5 SAFETY (NEEDLE) IMPLANT
NEEDLE HYPO 25X1 1.5 SAFETY (NEEDLE) ×3 IMPLANT
NS IRRIG 1000ML POUR BTL (IV SOLUTION) ×3 IMPLANT
PACK BASIN DAY SURGERY FS (CUSTOM PROCEDURE TRAY) ×3 IMPLANT
PAD CAST 3X4 CTTN HI CHSV (CAST SUPPLIES) ×4 IMPLANT
PAD CAST 4YDX4 CTTN HI CHSV (CAST SUPPLIES) IMPLANT
PADDING CAST ABS 3INX4YD NS (CAST SUPPLIES) ×1
PADDING CAST ABS 4INX4YD NS (CAST SUPPLIES) ×2
PADDING CAST ABS COTTON 3X4 (CAST SUPPLIES) ×2 IMPLANT
PADDING CAST ABS COTTON 4X4 ST (CAST SUPPLIES) ×4 IMPLANT
PADDING CAST COTTON 3X4 STRL (CAST SUPPLIES) ×6
PADDING CAST COTTON 4X4 STRL (CAST SUPPLIES)
PADDING UNDERCAST 2  STERILE (CAST SUPPLIES) ×3 IMPLANT
PASSER SUT SWANSON 36MM LOOP (INSTRUMENTS) ×6 IMPLANT
SCREW BIOCOM TENODESIS 3.8X8M (Screw) ×3 IMPLANT
SCREW COMPRESSION MICRO 2.5X12 (Screw) ×3 IMPLANT
SCREW COMPRESSION MICRO 2.5X16 (Screw) ×6 IMPLANT
SCREW HEADLESS 2.5X16 (Screw) ×4 IMPLANT
SHEET MEDIUM DRAPE 40X70 STRL (DRAPES) ×3 IMPLANT
SLING ARM FOAM STRAP LRG (SOFTGOODS) IMPLANT
SLING ARM FOAM STRAP MED (SOFTGOODS) ×3 IMPLANT
SPLINT FNGR PLAIN END 5/8X3.25 (CAST SUPPLIES) ×4 IMPLANT
SPLINT PLASTALUME 3 1/4 (CAST SUPPLIES) ×6
SPLINT PLASTER CAST XFAST 3X15 (CAST SUPPLIES) ×20 IMPLANT
SPLINT PLASTER CAST XFAST 4X15 (CAST SUPPLIES) IMPLANT
SPLINT PLASTER XTRA FAST SET 4 (CAST SUPPLIES)
SPLINT PLASTER XTRA FASTSET 3X (CAST SUPPLIES) ×10
SPONGE GAUZE 4X4 12PLY (GAUZE/BANDAGES/DRESSINGS) ×3 IMPLANT
STOCKINETTE 4X48 STRL (DRAPES) ×3 IMPLANT
STRIP CLOSURE SKIN 1/2X4 (GAUZE/BANDAGES/DRESSINGS) ×3 IMPLANT
SUCTION FRAZIER TIP 10 FR DISP (SUCTIONS) IMPLANT
SUT ETHIBOND 3-0 V-5 (SUTURE) IMPLANT
SUT ETHILON 3 0 PS 1 (SUTURE) IMPLANT
SUT ETHILON 5 0 PS 2 18 (SUTURE) IMPLANT
SUT MERSILENE 3 0 FS 1 (SUTURE) IMPLANT
SUT MERSILENE 4 0 P 3 (SUTURE) ×3 IMPLANT
SUT PROLENE 3 0 PS 2 (SUTURE) IMPLANT
SUT PROLENE 7 0 P 1 (SUTURE) IMPLANT
SUT STEEL 2 (SUTURE) IMPLANT
SUT STEEL 4 0 V 26 (SUTURE) IMPLANT
SUT VIC AB 3-0 FS2 27 (SUTURE) IMPLANT
SUT VIC AB 3-0 X1 27 (SUTURE) IMPLANT
SUT VIC AB 4-0 P-3 18XBRD (SUTURE) IMPLANT
SUT VIC AB 4-0 P3 18 (SUTURE)
SUT VICRYL 4-0 PS2 18IN ABS (SUTURE) ×3 IMPLANT
SUT VICRYL RAPIDE 4/0 PS 2 (SUTURE) IMPLANT
SYR BULB 3OZ (MISCELLANEOUS) ×3 IMPLANT
SYR CONTROL 10ML LL (SYRINGE) IMPLANT
SYRINGE 10CC LL (SYRINGE) ×3 IMPLANT
TOWEL OR 17X24 6PK STRL BLUE (TOWEL DISPOSABLE) ×6 IMPLANT
TUBE CONNECTING 20X1/4 (TUBING) IMPLANT
UNDERPAD 30X30 INCONTINENT (UNDERPADS AND DIAPERS) ×3 IMPLANT

## 2013-04-07 NOTE — Transfer of Care (Signed)
Immediate Anesthesia Transfer of Care Note  Patient: [name] [name]  Procedure(s) Performed: Procedure(s): RIGHT INDEX AND RIGHT LONG DISTAL INTERPHALANGEAL JOINT FUSIONS (Right) RIGHT THUMB CMC ARTHROPLASTY (Right) RIGHT WRIST STS RELEASE (Right) RIGHT WRIST MASS EXCISION (Right)  Patient Location: PACU  Anesthesia Type:GA combined with regional for post-op pain  Level of Consciousness: awake  Airway & Oxygen Therapy: Patient Spontanous Breathing and Patient connected to face mask oxygen  Post-op Assessment: Report given to PACU RN and Post -op Vital signs reviewed and stable  Post vital signs: Reviewed and stable  Complications: No apparent anesthesia complications

## 2013-04-07 NOTE — Anesthesia Preprocedure Evaluation (Signed)
Anesthesia Evaluation  Patient identified by MRN, date of birth, ID band Patient awake    Reviewed: Allergy & Precautions, H&P , Patient's Chart, lab work & pertinent test results  Airway Mallampati: I  Neck ROM: full    Dental no notable dental hx. (+) Dental Advidsory Given   Pulmonary neg pulmonary ROS,  breath sounds clear to auscultation  Pulmonary exam normal       Cardiovascular hypertension,     Neuro/Psych negative neurological ROS  negative psych ROS   GI/Hepatic Neg liver ROS, GERD-  ,  Endo/Other  negative endocrine ROS  Renal/GU negative Renal ROS     Musculoskeletal   Abdominal Normal abdominal exam  (+)   Peds  Hematology negative hematology ROS (+)   Anesthesia Other Findings   Reproductive/Obstetrics negative OB ROS                           Anesthesia Physical Anesthesia Plan  ASA: II  Anesthesia Plan: General LMA   Post-op Pain Management: MAC Combined w/ Regional for Post-op pain   Induction:   Airway Management Planned:   Additional Equipment:   Intra-op Plan:   Post-operative Plan:   Informed Consent: I have reviewed the patients History and Physical, chart, labs and discussed the procedure including the risks, benefits and alternatives for the proposed anesthesia with the patient or authorized representative who has indicated his/her understanding and acceptance.   Dental Advisory Given  Plan Discussed with: Anesthesiologist and CRNA  Anesthesia Plan Comments:         Anesthesia Quick Evaluation

## 2013-04-07 NOTE — Progress Notes (Signed)
Assisted Dr. Joslin with right, ultrasound guided, supraclavicular block. Side rails up, monitors on throughout procedure. See vital signs in flow sheet. Tolerated Procedure well. 

## 2013-04-07 NOTE — Anesthesia Procedure Notes (Addendum)
Procedure Name: LMA Insertion Performed by: Lance Coon Pre-anesthesia Checklist: Patient identified, Emergency Drugs available, Suction available and Patient being monitored Patient Re-evaluated:Patient Re-evaluated prior to inductionOxygen Delivery Method: Circle System Utilized Preoxygenation: Pre-oxygenation with 100% oxygen Intubation Type: IV induction Ventilation: Mask ventilation without difficulty LMA: LMA inserted LMA Size: 3.0 Number of attempts: 1 Airway Equipment and Method: bite block Placement Confirmation: positive ETCO2 Tube secured with: Tape Dental Injury: Teeth and Oropharynx as per pre-operative assessment    Anesthesia Regional Block:  Interscalene brachial plexus block  Pre-Anesthetic Checklist: ,, timeout performed, Correct Patient, Correct Site, Correct Laterality, Correct Procedure, Correct Position, site marked, Risks and benefits discussed,  Surgical consent,  Pre-op evaluation,  At surgeon's request and post-op pain management  Laterality: Left  Prep: chloraprep       Needles:  Injection technique: Single-shot  Needle Type: Echogenic Stimulator Needle     Needle Length:cm 9 cm Needle Gauge: 22 and 22 G    Additional Needles:  Procedures: Doppler guided, ultrasound guided (picture in chart) and nerve stimulator Interscalene brachial plexus block  Nerve Stimulator or Paresthesia:  Response: 0.8 mA,   Additional Responses:   Narrative:  Start time: 04/07/2013 10:35 AM End time: 04/07/2013 10:40 AM Injection made incrementally with aspirations every 5 mL.  Performed by: Personally   Additional Notes: 20 cc 0.5% marcaine 1:200 epi injected easily  Interscalene brachial plexus block

## 2013-04-07 NOTE — H&P (Signed)
[name] [name] is an 62 y.o. female.   Chief Complaint: right thumb, index,long DJD with pain and deformity HPI: as above with chronic pain and swelling from arthritic right thumb, index, and long fingers  Past Medical History  Diagnosis Date  . Osteoporosis   . Varicosities     venous  . Cardiomyopathy HX --06/2010    EF was 25% during acute illness (PHELONEPHRITIS) Repeat echo 12-06-10 60% showed normal EF.   Marland Kitchen Hypertension   . Headache(784.0)   . PONV (postoperative nausea and vomiting)   . Heart murmur     DENIES S & S   (ECHO JUN'12 W/ CHART)  . History of chronic bronchitis   . Chronic back pain greater than 3 months duration     S/P BACK SURG'S  . Arachnoiditis BILATERAL LEGS    DUE TO MULTIPLE BACK SURG.'S  . Weakness of both legs DUE TO ARACHNOIDITIS    OCCASIONAL USES CANE  . Blood transfusion   . GERD (gastroesophageal reflux disease) AND HIATIAL HERNIA    CONTROLLED W/ NEXIUM  . Acute sinusitis, unspecified   . Anxiety state, unspecified   . Essential hypertension, benign   . Dyslipidemia   . Other malaise and fatigue   . Murmur, heart   . Shortness of breath   . Neuromuscular disorder     numbness and tingling  . Arthritis   . Hx of bladder infections   . Dysphagia     some post op cerv fusion 2/14  . Asthma     Past Surgical History  Procedure Laterality Date  . Lumbar fusion  11-30-10    L4 - 5  . Exploration of incision for csf leak  DEC 2011  X2    POST LAMINECOTMY  . Lumbar laminectomy  DEC 2011    L4 - 5  . Lumbar fusion  2000    L2 - 4  . Cervical disc surgery  2005    C5 - 6  . Anterior fusion cervical spine  2007    C5 -6  . Tendon repair  JAN 2010    LEFT INDEX AND LONG FINGERS  . Left wrist tenosynectomy w/ left thumb joint repair  02-24-10  . Abdominal hysterectomy  1987    W/ BSO  . Cholecystectomy  1994  . Knee arthroscopy  LEFT X3 (LAST ONE 2005)  . Carpal tunnel release  RIGHT - 2001  & DEC 2011 W/ BACK SURG.  . Knee  arthroscopy  05/17/2011    Procedure: ARTHROSCOPY KNEE;  Surgeon: Curlene Labrum III;  Location: [facility];  Service: Orthopedics;  Laterality: Right;  WITH MEDIAL MENISECTOMY AND removal of suprapatella fat lump  . Anterior cervical decomp/discectomy fusion N/A 08/09/2012    Procedure: ANTERIOR CERVICAL DECOMPRESSION/DISCECTOMY FUSION 1 LEVEL;  Surgeon: Karn Cassis, MD;  Location: [facility];  Service: Neurosurgery;  Laterality: N/A;  Cervical four-five  Anterior cervical decompression/diskectomy/fusion    Family History  Problem Relation Age of Onset  . Hypertension Mother   . Heart disease Mother   . Diabetes Mother   . Cancer Father   . Diabetes Brother    Social History:  reports that she has never smoked. She has never used smokeless tobacco. She reports that she does not drink alcohol or use illicit drugs.  Allergies:  Allergies  Allergen Reactions  . Latex Itching and Rash  . Morphine And Related Hives and Itching  . Aspirin Nausea And Vomiting  Can take coated asa  . Biaxin [Clarithromycin] Nausea And Vomiting  . Clarithromycin Diarrhea  . Codeine Itching  . Darvocet [Propoxyphene-Acetaminophen] Itching  . Lortab [Hydrocodone-Acetaminophen] Itching  . Percocet [Oxycodone-Acetaminophen] Itching    Medications Prior to Admission  Medication Sig Dispense Refill  . albuterol (PROVENTIL HFA;VENTOLIN HFA) 108 (90 BASE) MCG/ACT inhaler Inhale 2 puffs into the lungs every 4 (four) hours as needed. For shortness of breath      . amitriptyline (ELAVIL) 50 MG tablet Take 1 tablet (50 mg total) by mouth at bedtime.  30 tablet  3  . aspirin EC 81 MG tablet Take 81 mg by mouth See admin instructions. Every other day.      . Biotin 10 MG CAPS Take 10 mg by mouth daily.      . bisoprolol-hydrochlorothiazide (ZIAC) 5-6.25 MG per tablet Take 1 tablet by mouth daily.       . budesonide-formoterol (SYMBICORT) 80-4.5 MCG/ACT inhaler Inhale 2 puffs into the lungs 2  (two) times daily.  1 Inhaler  0  . Calcium Carbonate-Vit D-Min 1200-1000 MG-UNIT CHEW Chew 1 tablet by mouth 2 (two) times daily.       . cyclobenzaprine (FLEXERIL) 10 MG tablet Take 10 mg by mouth 2 (two) times daily as needed for muscle spasms.       . diazepam (VALIUM) 10 MG tablet Take 10 mg by mouth 3 (three) times daily as needed (muscle spasms).       Marland Kitchen esomeprazole (NEXIUM) 40 MG capsule Take 40 mg by mouth every morning.       . estrogens, conjugated, (PREMARIN) 1.25 MG tablet Take 1.25 mg by mouth every morning.       . gabapentin (NEURONTIN) 600 MG tablet Take 600 mg by mouth 2 (two) times daily.      Marland Kitchen ibandronate (BONIVA) 150 MG tablet Take 150 mg by mouth every 30 (thirty) days. Take in the morning with a full glass of water, on an empty stomach, and do not take anything else by mouth or lie down for the next 30 min.      . levalbuterol (XOPENEX HFA) 45 MCG/ACT inhaler Inhale 1-2 puffs into the lungs every 4 (four) hours as needed. For shortness of breath      . oxyCODONE (ROXICODONE) 15 MG immediate release tablet Take 1 tablet (15 mg total) by mouth every 6 (six) hours as needed. For pain  120 tablet  0  . oxymorphone (OPANA ER) 40 MG 12 hr tablet Take 1 tablet (40 mg total) by mouth every 12 (twelve) hours.  60 tablet  0  . valACYclovir (VALTREX) 500 MG tablet Take 1,000 mg by mouth 2 (two) times daily.       . vitamin B-12 (CYANOCOBALAMIN) 1000 MCG tablet Take 1,000 mcg by mouth daily.       . budesonide-formoterol (SYMBICORT) 80-4.5 MCG/ACT inhaler Inhale 2 puffs into the lungs as needed (asthma).        Results for orders placed during the hospital encounter of 04/07/13 (from the past 48 hour(s))  POCT HEMOGLOBIN-HEMACUE     Status: None   Collection Time    04/07/13 10:22 AM      Result Value Range   Hemoglobin 13.0  12.0 - 15.0 g/dL   No results found.  Review of Systems  All other systems reviewed and are negative.    Height 5\' 3"  (1.6 m), weight 71.215 kg (157  lb). Physical Exam  Constitutional: She is oriented to person,  place, and time. She appears well-developed and well-nourished.  HENT:  Head: Normocephalic and atraumatic.  Cardiovascular: Normal rate.   Respiratory: Effort normal.  Musculoskeletal:       Right hand: She exhibits tenderness, bony tenderness, deformity and swelling.  Right thumb CMCJ DJD and right index and long DIPJ DJD  Neurological: She is alert and oriented to person, place, and time.  Skin: Skin is warm.  Psychiatric: She has a normal mood and affect. Her behavior is normal. Judgment and thought content normal.     Assessment/Plan As above  Plan CMC aplasty and index and long DIP fusions  Alonni Heimsoth A 04/07/2013, 10:25 AM

## 2013-04-07 NOTE — Anesthesia Postprocedure Evaluation (Signed)
  Anesthesia Post-op Note  Patient: [name] [name]  Procedure(s) Performed: Procedure(s): RIGHT INDEX AND RIGHT LONG DISTAL INTERPHALANGEAL JOINT FUSIONS (Right) RIGHT THUMB CMC ARTHROPLASTY (Right) RIGHT WRIST STS RELEASE (Right) RIGHT WRIST MASS EXCISION (Right)  Patient Location: PACU  Anesthesia Type:General and GA combined with regional for post-op pain  Level of Consciousness: awake, alert  and oriented  Airway and Oxygen Therapy: Patient Spontanous Breathing  Post-op Pain: none  Post-op Assessment: Post-op Vital signs reviewed, Patient's Cardiovascular Status Stable, Respiratory Function Stable, Patent Airway and Pain level controlled  Post-op Vital Signs: stable  Complications: No apparent anesthesia complications

## 2013-04-07 NOTE — Op Note (Signed)
See note [phone]

## 2013-04-08 NOTE — Op Note (Deleted)
[name] [name], [name] [name]                ACCOUNT NO.:  [id number]  MEDICAL RECORD NO.:  [id number]  LOCATION:                               FACILITY:  [facility]  PHYSICIAN:  Artist Pais. 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  PROCEDURE:  Right thumb carpometacarpal arthroplasty with abductor pollicis longus tendon transfer and fixation with 3.8 mm Arthrex Bio- tenodesis screw, right wrist de Quervain release tenosynovectomy, and right index and right long finger distal interphalangeal joint arthrodesis using local bone graft and 16-mm mini Arthrex compression screws as well as open reduction and internal fixation of trapezoid fracture using 12-mm Arthrex compression screw.  SURGEON:  Artist Pais. Mina Marble, M.D.  ASSISTANT:  Jonni Sanger, P.A.  ANESTHESIA:  Ax block and general.  COMPLICATIONS:  No complications.  DRAINS:  No drains.  DESCRIPTION OF PROCEDURE:  The patient was taken to the operating suite. After induction of adequate axillary block analgesia and general anesthetic, right upper extremity was prepped and draped in usual sterile fashion.  An Esmarch was used to exsanguinate the limb. Tourniquet was inflated to 250 mmHg.  At this point in time, an incision was made over the [facility] and first dorsal compartment area of the right thumb.  Skin was incised sharply.  Dissection was carried down to the skin and subcutaneous tissues.  Snuffbox artery was identified and retracted.   The first dorsal compartment was identified.  It was exposed at its most dorsal most extent.  The APL and EP tendons were lysed of all adhesions.  There was a separate compartment at the EPB tendon.  At this point in time, a subperiosteal dissection of thumb metacarpal base and trapezium was undertaken.  They were both exposed.  Next, using curettes, osteotomes, and rongeurs, the trapezium was removed in piecemeal.  Complete CMC synovectomy was performed.  At this point in time, we noticed that there was a crack in the trapezoid in the body, this was identified fluoroscopically and under direct vision and fixed with a 12 mm Arthrex micro compression screw from dorsal to volar. There was a small stab wound.  Intraoperative fluoroscopy revealed adequate reduction in AP, lateral, and oblique view.  At this point in time, the radial most slip of the abductor pollicis longus tendon group was identified, transected at the musculotendinous junction, and drawn into the distal wound.  Next using hand drills and hand awl, a transosseous canal was made in the thumb metacarpal base exiting dorsally and distally from the middle of the joint line 2 cm dorsally. This was done again with a hand awl and sequential hand drills.  The wound was thoroughly irrigated and debrided of osseous debris using the Arthrex 2 x 8 mm screw set.  The guidewire was placed at the base of  the index metacarpal and driven distally and dorsally followed by 3-mm drill hole to hold the interference screw.  The APL tendon transfer was then passed from dorsal volar out the thumb metacarpal base, drawn into the index metacarpal base, and with the appropriate tension fixed with the Bio-tenodesis screw.  Intraoperative fluoroscopy revealed adequate reduction in AP, lateral, oblique views.  The wound was then thoroughly irrigated.  It was closed in layers of 4-0 Vicryl for the capsule and 4- 0 Vicryl Rapide subcuticular stitch on the  skin.  Second incision was made over the index distal interphalangeal joint.  Skin was incised sharply.  Flaps were raised.  Transverse incision was made over the distal interphalangeal joint.  The joint was entered.  Osteophytes were removed to be used as bone graft later.  The joint was shotgunned open. The remaining cartilage at the head of the middle phalanx and the base of the distal phalanx was decorticated.  A guidewire was then placed from distal to proximal across the index finger distal interphalangeal joint under direct fluoroscopic guidance.  Next, using the appropriate drill from the Arthrex compression screw set, a 16 mm micro Arthrex compression screw was placed across the arthrodesis site with local bone graft packed dorsally.  Intraoperative fluoroscopy revealed adequate reduction in AP and lateral view.  The same procedure was performed for the long finger using the same size screw.  At this point in time, these incisions were closed with 4-0 Vicryl for the extensor mechanism, and Rapide on the skin.  The patient was then placed in a sterile dressing of Xeroform, 4x4s, and Coban volar splints and compression wrap to the index and long, and a radial gutter splint for the thumb.  The patient tolerated all procedures well and went to the recovery room in a stable fashion.     Artist Pais Mina Marble, M.D.     MAW/MEDQ  D:  04/07/2013  T:  04/08/2013  Job:  [id number]

## 2013-04-08 NOTE — Op Note (Signed)
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  PROCEDURE:  Right thumb carpometacarpal arthroplasty with abductor pollicis longus tendon transfer and fixation with 3.8 mm Arthrex Bio- tenodesis screw, right wrist de Quervain release tenosynovectomy, and right index and right long finger distal interphalangeal joint arthrodesis using local bone graft and 16-mm mini Arthrex compression screws as well as open reduction and internal fixation of trapezoid fracture using 12-mm Arthrex compression screw.  SURGEON:  [name] [name], M.D.  ASSISTANT:  [name] [name], P.A.  ANESTHESIA:  Ax block and general.  COMPLICATIONS:  No complications.  DRAINS:  No drains.  DESCRIPTION OF PROCEDURE:  The patient was taken to the operating suite. After induction of adequate axillary block analgesia and general anesthetic, right upper extremity was prepped and draped in usual sterile fashion.  An Esmarch was used to exsanguinate the limb. Tourniquet was inflated to 250 mmHg.  At this point in time, an incision was made over the CMC and first dorsal compartment area of the right thumb.  Skin was incised sharply.  Dissection was carried down to the skin and subcutaneous tissues.  Snuffbox artery was identified and retracted.   The first dorsal compartment was identified.  It was exposed at its most dorsal most extent.  The APL and EP tendons were lysed of all adhesions.  There was a separate compartment at the EPB tendon.  At this point in time, a subperiosteal dissection of thumb metacarpal base and trapezium was undertaken.  They were both exposed.  Next, using curettes, osteotomes, and rongeurs, the trapezium was removed in piecemeal.  Complete CMC synovectomy was performed.  At this point in time, we noticed that there was a crack in the trapezoid in the body, this was identified fluoroscopically and under direct vision and fixed with a 12 mm Arthrex micro compression screw from dorsal to volar. There was a small stab wound.  Intraoperative fluoroscopy revealed adequate reduction in AP, lateral, and oblique view.  At this point in time, the radial most slip of the abductor pollicis longus tendon group was identified, transected at the musculotendinous junction, and drawn into the distal wound.  Next using hand drills and hand awl, a transosseous canal was made in the thumb metacarpal base exiting dorsally and distally from the middle of the joint line 2 cm dorsally. This was done again with a hand awl and sequential hand drills.  The wound was thoroughly irrigated and debrided of osseous debris using the Arthrex 2 x 8 mm screw set.  The guidewire was placed at the base of   the index metacarpal and driven distally and dorsally followed by 3-mm drill hole to hold the interference screw.  The APL tendon transfer was then passed from dorsal volar out the thumb metacarpal base, drawn into the index metacarpal base, and with the appropriate tension fixed with the Bio-tenodesis screw.  Intraoperative fluoroscopy revealed adequate reduction in AP, lateral, oblique views.  The wound was then thoroughly irrigated.  It was closed in layers of 4-0 Vicryl for the capsule and 4- 0 Vicryl Rapide subcuticular stitch on the  skin.  Second incision was made over the index distal interphalangeal joint.  Skin was incised sharply.  Flaps were raised.  Transverse incision was made over the distal interphalangeal joint.  The joint was entered.  Osteophytes were removed to be used as bone graft later.  The joint was shotgunned open. The remaining cartilage at the head of the middle phalanx and the base of the distal phalanx was decorticated.  A guidewire was then placed from distal to proximal across the index finger distal interphalangeal joint under direct fluoroscopic guidance.  Next, using the appropriate drill from the Arthrex compression screw set, a 16 mm micro Arthrex compression screw was placed across the arthrodesis site with local bone graft packed dorsally.  Intraoperative fluoroscopy revealed adequate reduction in AP and lateral view.  The same procedure was performed for the long finger using the same size screw.  At this point in time, these incisions were closed with 4-0 Vicryl for the extensor mechanism, and Rapide on the skin.  The patient was then placed in a sterile dressing of Xeroform, 4x4s, and Coban volar splints and compression wrap to the index and long, and a radial gutter splint for the thumb.  The patient tolerated all procedures well and went to the recovery room in a stable fashion.     [name] [name], M.D.     MAW/MEDQ  D:  04/07/2013  T:  04/08/2013  Job:  [id number] 

## 2013-04-10 ENCOUNTER — Encounter (HOSPITAL_BASED_OUTPATIENT_CLINIC_OR_DEPARTMENT_OTHER): Payer: Self-pay | Admitting: Orthopedic Surgery

## 2013-04-10 DIAGNOSIS — M19049 Primary osteoarthritis, unspecified hand: Secondary | ICD-10-CM | POA: Diagnosis not present

## 2013-04-24 ENCOUNTER — Other Ambulatory Visit: Payer: Self-pay

## 2013-05-01 ENCOUNTER — Other Ambulatory Visit: Payer: Self-pay | Admitting: Physical Medicine and Rehabilitation

## 2013-05-05 DIAGNOSIS — Z23 Encounter for immunization: Secondary | ICD-10-CM | POA: Diagnosis not present

## 2013-05-06 DIAGNOSIS — M19049 Primary osteoarthritis, unspecified hand: Secondary | ICD-10-CM | POA: Diagnosis not present

## 2013-05-08 DIAGNOSIS — R5383 Other fatigue: Secondary | ICD-10-CM | POA: Diagnosis not present

## 2013-05-08 DIAGNOSIS — I10 Essential (primary) hypertension: Secondary | ICD-10-CM | POA: Diagnosis not present

## 2013-05-08 DIAGNOSIS — R5381 Other malaise: Secondary | ICD-10-CM | POA: Diagnosis not present

## 2013-05-20 ENCOUNTER — Encounter: Payer: Medicare Other | Attending: Physical Medicine and Rehabilitation | Admitting: Physical Medicine & Rehabilitation

## 2013-05-20 ENCOUNTER — Encounter: Payer: Self-pay | Admitting: Physical Medicine & Rehabilitation

## 2013-05-20 VITALS — BP 169/96 | HR 70 | Resp 14 | Ht 64.0 in | Wt 167.0 lb

## 2013-05-20 DIAGNOSIS — M5412 Radiculopathy, cervical region: Secondary | ICD-10-CM

## 2013-05-20 DIAGNOSIS — M961 Postlaminectomy syndrome, not elsewhere classified: Secondary | ICD-10-CM | POA: Diagnosis not present

## 2013-05-20 DIAGNOSIS — F341 Dysthymic disorder: Secondary | ICD-10-CM

## 2013-05-20 DIAGNOSIS — G039 Meningitis, unspecified: Secondary | ICD-10-CM | POA: Diagnosis not present

## 2013-05-20 DIAGNOSIS — F418 Other specified anxiety disorders: Secondary | ICD-10-CM

## 2013-05-20 DIAGNOSIS — X58XXXA Exposure to other specified factors, initial encounter: Secondary | ICD-10-CM | POA: Insufficient documentation

## 2013-05-20 MED ORDER — OXYCODONE HCL 15 MG PO TABS: 15.0000 mg | ORAL_TABLET | Freq: Four times a day (QID) | ORAL | Status: DC | PRN

## 2013-05-20 MED ORDER — AMITRIPTYLINE HCL 100 MG PO TABS: 100.0000 mg | ORAL_TABLET | Freq: Every day | ORAL | Status: DC

## 2013-05-20 MED ORDER — OXYMORPHONE HCL ER 40 MG PO TB12: 40.0000 mg | ORAL_TABLET | Freq: Two times a day (BID) | ORAL | Status: DC

## 2013-05-20 NOTE — Patient Instructions (Signed)
NUCYNTA IR   AND NUCYNTA ER

## 2013-05-20 NOTE — Progress Notes (Signed)
Subjective:    Patient ID: [name] [name], female    DOB: November 30, 1950, 62 y.o.   MRN: [id number]  HPI  [name] [name] is back regarding her chronic pain. For the most part she has remained stable. She reports an incident over the weekend where she began to feel the shakes and had tremors and weakness in her legs which lasted for about 10 minutes. She had only been standing for a few minutes when this took place. Her sugars were ok. She didn't lose consciousness.   She is also reporting more problems with walking and pain in the right hip.   Her CT myelogram revealed: Solid appearing fusion from L2 through L5. Pronounced arachnoiditis  pattern. Old augmented fracture at L1. No stenosis. Slight rocking  motion at the L1-2 level with flexion and extension.  She had hand surgery and fusion by Dr. Mina Marble in October and she's just now recovering from that.  She continues on her pain medication per my rx. She felt that the increased elavil helped her sleep.   Pain Inventory Average Pain 7 Pain Right Now 9 My pain is sharp, burning, stabbing, tingling and aching  In the last 24 hours, has pain interfered with the following? General activity 8 Relation with others 8 Enjoyment of life 8 What TIME of day is your pain at its worst? all day Sleep (in general) Fair  Pain is worse with: walking, sitting, standing and some activites Pain improves with: heat/ice and medication Relief from Meds: 7  Mobility walk without assistance walk with assistance use a cane ability to climb steps?  yes do you drive?  yes  Function not employed: date last employed na  Neuro/Psych weakness numbness tremor tingling trouble walking spasms dizziness  Prior Studies Any changes since last visit?  yes x-rays  Physicians involved in your care Any changes since last visit?  no   Family History  Problem Relation Age of Onset  . Hypertension Mother   . Heart disease Mother   . Diabetes Mother     . Cancer Father   . Diabetes Brother    History   Social History  . Marital Status: Married    Spouse Name: N/A    Number of Children: N/A  . Years of Education: N/A   Occupational History  . disable    Social History Main Topics  . Smoking status: Never Smoker   . Smokeless tobacco: Never Used  . Alcohol Use: No  . Drug Use: No  . Sexual Activity: None     Comment: second hand smoke   Other Topics Concern  . None   Social History Narrative   Has one child   Disabled   Past Surgical History  Procedure Laterality Date  . Lumbar fusion  11-30-10    L4 - 5  . Exploration of incision for csf leak  DEC 2011  X2    POST LAMINECOTMY  . Lumbar laminectomy  DEC 2011    L4 - 5  . Lumbar fusion  2000    L2 - 4  . Cervical disc surgery  2005    C5 - 6  . Anterior fusion cervical spine  2007    C5 -6  . Tendon repair  JAN 2010    LEFT INDEX AND LONG FINGERS  . Left wrist tenosynectomy w/ left thumb joint repair  02-24-10  . Abdominal hysterectomy  1987    W/ BSO  . Cholecystectomy  1994  . Knee  arthroscopy  LEFT X3 (LAST ONE 2005)  . Carpal tunnel release  RIGHT - 2001  & DEC 2011 W/ BACK SURG.  . Knee arthroscopy  05/17/2011    Procedure: ARTHROSCOPY KNEE;  Surgeon: Curlene Labrum III;  Location: [facility];  Service: Orthopedics;  Laterality: Right;  WITH MEDIAL MENISECTOMY AND removal of suprapatella fat lump  . Anterior cervical decomp/discectomy fusion N/A 08/09/2012    Procedure: ANTERIOR CERVICAL DECOMPRESSION/DISCECTOMY FUSION 1 LEVEL;  Surgeon: Karn Cassis, MD;  Location: [facility];  Service: Neurosurgery;  Laterality: N/A;  Cervical four-five  Anterior cervical decompression/diskectomy/fusion  . Finger arthrodesis Right 04/07/2013    Procedure: RIGHT INDEX AND RIGHT LONG DISTAL INTERPHALANGEAL JOINT FUSIONS;  Surgeon: Marlowe Shores, MD;  Location: [facility];  Service: Orthopedics;  Laterality: Right;  . Finger  arthroplasty Right 04/07/2013    Procedure: RIGHT THUMB CMC ARTHROPLASTY;  Surgeon: Marlowe Shores, MD;  Location: [facility];  Service: Orthopedics;  Laterality: Right;  . Dorsal compartment release Right 04/07/2013    Procedure: RIGHT WRIST STS RELEASE;  Surgeon: Marlowe Shores, MD;  Location: [facility];  Service: Orthopedics;  Laterality: Right;  . Ganglion cyst excision Right 04/07/2013    Procedure: RIGHT WRIST MASS EXCISION;  Surgeon: Marlowe Shores, MD;  Location: [facility];  Service: Orthopedics;  Laterality: Right;   Past Medical History  Diagnosis Date  . Osteoporosis   . Varicosities     venous  . Cardiomyopathy HX --06/2010    EF was 25% during acute illness (PHELONEPHRITIS) Repeat echo 12-06-10 60% showed normal EF.   Marland Kitchen Hypertension   . Headache(784.0)   . PONV (postoperative nausea and vomiting)   . Heart murmur     DENIES S & S   (ECHO JUN'12 W/ CHART)  . History of chronic bronchitis   . Chronic back pain greater than 3 months duration     S/P BACK SURG'S  . Arachnoiditis BILATERAL LEGS    DUE TO MULTIPLE BACK SURG.'S  . Weakness of both legs DUE TO ARACHNOIDITIS    OCCASIONAL USES CANE  . Blood transfusion   . GERD (gastroesophageal reflux disease) AND HIATIAL HERNIA    CONTROLLED W/ NEXIUM  . Acute sinusitis, unspecified   . Anxiety state, unspecified   . Essential hypertension, benign   . Dyslipidemia   . Other malaise and fatigue   . Murmur, heart   . Shortness of breath   . Neuromuscular disorder     numbness and tingling  . Arthritis   . Hx of bladder infections   . Dysphagia     some post op cerv fusion 2/14  . Asthma    BP 169/96  Pulse 70  Resp 14  Ht 5\' 4"  (1.626 m)  Wt 167 lb (75.751 kg)  BMI 28.65 kg/m2  SpO2 94%     Review of Systems  Respiratory: Positive for cough, shortness of breath and wheezing.   Cardiovascular: Positive for leg swelling.  Musculoskeletal: Positive  for back pain, gait problem and neck pain.  Neurological: Positive for dizziness, tremors, weakness and numbness.       Tingling, spasms  All other systems reviewed and are negative.       Objective:   Physical Exam  Constitutional: She is oriented to person, place, and time. She appears well-developed and well-nourished.  HENT:  Head: Normocephalic and atraumatic.  Eyes: Conjunctivae and EOM are normal. Pupils  are equal, round, and reactive to light.  Neck: Normal range of motion. Neck supple.  Cardiovascular: Normal rate and regular rhythm.  Pulmonary/Chest: Effort normal and breath sounds normal.  Abdominal: Soft. Bowel sounds are normal. She exhibits no distension.  Musculoskeletal: tenderness with palpation of both greater trochs Cervical back: She exhibits decreased range of motion and tenderness.  Lumbar back: She exhibits decreased range of motion, tenderness, bony tenderness and spasm. She can bend at the waist to approximately 45 degrees before she begins to hurt. Neurological: She is alert and oriented to person, place, and time. No cranial nerve deficit.  She has some subjective, mild weakness in the upper extremities. Strength grossly 4/5 in UE's. She has more subjective weakness with ADF and APF bilaterally. Her right leg trembles when she stands.(it did so more when i palpated the right greater troch which was tender.)  Still there are no consistent or focal sensory findings are seen in the arms. Lower extremities continues to show some mild sensory loss at 1-1+ out of 2, particularly below the knees. Inconsistent weakness in the legs with pain inhibition noted. Reflexes are generally 1+ throughout both legs. Gait is slightly wide based and perhaps a little antalgic on the right leg but otherwise appropriate.  Psychiatric: She has a normal mood and affect. Her behavior is normal. Judgment and thought content normal.   Assessment & Plan:   ASSESSMENT:  1. 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 She has documented C6 radiculitis in the past.   2. Post lumbar post laminectomy syndrome.  3. 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 4. Anxiety/depression secondary to the above     PLAN:  1. Continue gabapentin 600mg  TID for nerve.  2. Spinal stimulator was discussed again today. Discussed the importance of regular stretching of her back, pelvis, and LE's.  3. Continue with Opana ER to 40mg  q12 #60  4. Will continue oxy IR for breakthrough.   q6 hour prn #120  5. Consider caudal block, although she's not anxious for another injection  at this time.  6. Increase elavil to 100mg  qhs 7. Flexeril and valium prn for spasms  8. The patient will see RN in one month. All questions were encouraged and answered.

## 2013-05-26 ENCOUNTER — Telehealth: Payer: Self-pay | Admitting: Internal Medicine

## 2013-05-26 NOTE — Telephone Encounter (Signed)
Pt states she got a contact from my chart time to set up chest ct i see 2 order one for feb 2015 and another one for 4 2015 whwn does she need to do this ?

## 2013-05-26 NOTE — Telephone Encounter (Signed)
I spoke with pt and she would prefer to be in done in feb. thanks

## 2013-05-26 NOTE — Telephone Encounter (Signed)
She had a 3mm nodule in feb 2014. She needs a CT chest after Feb 2015 which can be in FEb 2015 or April 2015; any one depending on her convenince and fu plan based on prior OV  Dr. Kalman Shan, M.D., [facility].C.P Pulmonary and Critical Care Medicine Staff Physician [facility] Pager: [phone], If no answer or between  15:00h - 7:00h: call [phone]  05/26/2013 4:58 PM

## 2013-05-26 NOTE — Telephone Encounter (Signed)
Order was placed back in April. Please advise PCC's thanks

## 2013-05-28 NOTE — Telephone Encounter (Signed)
I spoke with the pt to set follow-up. She is requesting that the CT be scheduled for 06-24-12 after 12 noon. [place] but will be here for another doctor visit and she wants ct done on the same day. Thanks. Carron Curie, CMA

## 2013-05-28 NOTE — Telephone Encounter (Signed)
Pcc's please advise if this has been taken care of? thanks

## 2013-05-30 NOTE — Telephone Encounter (Signed)
Chest ct 06/24/13@1pm @[facility]  pt is aware of this appt [name] [name]

## 2013-06-03 DIAGNOSIS — M159 Polyosteoarthritis, unspecified: Secondary | ICD-10-CM | POA: Diagnosis not present

## 2013-06-16 ENCOUNTER — Other Ambulatory Visit: Payer: Self-pay | Admitting: *Deleted

## 2013-06-16 DIAGNOSIS — G039 Meningitis, unspecified: Secondary | ICD-10-CM

## 2013-06-16 DIAGNOSIS — M961 Postlaminectomy syndrome, not elsewhere classified: Secondary | ICD-10-CM

## 2013-06-16 DIAGNOSIS — M5412 Radiculopathy, cervical region: Secondary | ICD-10-CM

## 2013-06-16 MED ORDER — OXYMORPHONE HCL ER 40 MG PO TB12: 40.0000 mg | ORAL_TABLET | Freq: Two times a day (BID) | ORAL | Status: DC

## 2013-06-16 MED ORDER — OXYCODONE HCL 15 MG PO TABS: 15.0000 mg | ORAL_TABLET | Freq: Four times a day (QID) | ORAL | Status: DC | PRN

## 2013-06-16 NOTE — Telephone Encounter (Signed)
RX printed early for controlled medication for the visit with RN on 06/24/13 (to be signed by MD) 

## 2013-06-20 ENCOUNTER — Ambulatory Visit: Payer: Medicare Other

## 2013-06-24 ENCOUNTER — Encounter: Payer: Self-pay | Admitting: *Deleted

## 2013-06-24 ENCOUNTER — Ambulatory Visit (INDEPENDENT_AMBULATORY_CARE_PROVIDER_SITE_OTHER)
Admission: RE | Admit: 2013-06-24 | Discharge: 2013-06-24 | Disposition: A | Discharge status: Home / Self Care | Payer: Medicare Other | Source: Ambulatory Visit | Attending: Internal Medicine | Admitting: Internal Medicine

## 2013-06-24 ENCOUNTER — Encounter: Payer: Medicare Other | Attending: Physical Medicine & Rehabilitation | Admitting: *Deleted

## 2013-06-24 VITALS — BP 112/60 | HR 69 | Resp 14

## 2013-06-24 DIAGNOSIS — G039 Meningitis, unspecified: Secondary | ICD-10-CM

## 2013-06-24 DIAGNOSIS — R911 Solitary pulmonary nodule: Secondary | ICD-10-CM

## 2013-06-24 DIAGNOSIS — Z760 Encounter for issue of repeat prescription: Secondary | ICD-10-CM | POA: Insufficient documentation

## 2013-06-24 DIAGNOSIS — M5412 Radiculopathy, cervical region: Secondary | ICD-10-CM

## 2013-06-24 DIAGNOSIS — R918 Other nonspecific abnormal finding of lung field: Secondary | ICD-10-CM | POA: Diagnosis not present

## 2013-06-24 DIAGNOSIS — M79609 Pain in unspecified limb: Secondary | ICD-10-CM | POA: Diagnosis not present

## 2013-06-24 DIAGNOSIS — M549 Dorsalgia, unspecified: Secondary | ICD-10-CM | POA: Insufficient documentation

## 2013-06-24 DIAGNOSIS — M25569 Pain in unspecified knee: Secondary | ICD-10-CM | POA: Insufficient documentation

## 2013-06-24 DIAGNOSIS — M961 Postlaminectomy syndrome, not elsewhere classified: Secondary | ICD-10-CM

## 2013-06-24 MED ORDER — GABAPENTIN 600 MG PO TABS: 600.0000 mg | ORAL_TABLET | Freq: Three times a day (TID) | ORAL | Status: DC

## 2013-06-24 NOTE — Progress Notes (Signed)
Here for pill count and medication refills. Oxycodone 15 mg # 120 Fill date 05/29/13    Today NV#  15  Opana ER 40 mg # 72  Fill date 06/06/13  Today NV# 23  Pill counts are appropriate.  VSS    Pain level: 9  Pain is running from back and down to her knee (outside of her leg).  She has had injections in the past but does not want to try injection since really has not gotten relief and has had 3 spinal fluid leaks and is scared of having a needle in her back again. She says she will wait another month and see if it gets any better.  She has been on prednisone for some virus and even while taking it she did not get any relief. Return to see RN in one month for med refill and pill count.  She is having a CT scan today because she has nodules in her lungs that they are watching.  Opioid risk score done today and it is a 2.

## 2013-06-24 NOTE — Patient Instructions (Signed)
Follow up one month with RN for pill count and med refill

## 2013-07-01 ENCOUNTER — Encounter: Payer: Self-pay | Admitting: Internal Medicine

## 2013-07-01 ENCOUNTER — Ambulatory Visit (INDEPENDENT_AMBULATORY_CARE_PROVIDER_SITE_OTHER): Payer: Medicare Other | Admitting: Internal Medicine

## 2013-07-01 VITALS — BP 136/80 | HR 71 | Ht 64.0 in | Wt 171.0 lb

## 2013-07-01 DIAGNOSIS — J45909 Unspecified asthma, uncomplicated: Secondary | ICD-10-CM

## 2013-07-01 DIAGNOSIS — R911 Solitary pulmonary nodule: Secondary | ICD-10-CM

## 2013-07-01 DIAGNOSIS — J4530 Mild persistent asthma, uncomplicated: Secondary | ICD-10-CM

## 2013-07-01 MED ORDER — LEVALBUTEROL TARTRATE 45 MCG/ACT IN AERO: 1.0000 | INHALATION_SPRAY | RESPIRATORY_TRACT | Status: DC | PRN

## 2013-07-01 MED ORDER — BUDESONIDE-FORMOTEROL FUMARATE 80-4.5 MCG/ACT IN AERO: 2.0000 | INHALATION_SPRAY | Freq: Two times a day (BID) | RESPIRATORY_TRACT | Status: DC

## 2013-07-01 NOTE — Progress Notes (Signed)
Subjective:    Patient ID: [name] [name], female    DOB: 07/23/1950, 63 y.o.   MRN: [id number]  HPI    PCP is HASANAJ,XAJE A, MD Cards is dr Vita Barley  Body mass index is 26.83 kg/(m^2). Passive smoker x 17 years   IOV 04/22/2012 63 year old female. Referred for dyspnea. Wondering if she has passive smoking related lung disease (husband and son)  Dyspnea: In 2011 December was critically ill following spine surgery, had UTI and low EF but since then has recovered from critical illness. Says she has been dyspneic even before this critical illness for total 3-4 years but since past 1 year more noticeable dyspnea. Sometimes dyspneic walking from room to room. Sometimes dyspneic talking on phone. Triggers include cig smoke  Of husband ("husband's cig smoke is killing me and they think I am crazy") and is associated with cough at that time. Dyspnea is slowly progressive. Severity is mild-moderate. No orthopnea. No paroxysmal nocturnal dyspnea but has woken up wheezing at night since sept 2013 (was exposed to perfume at doctor office and dx acute bronchitis and given abx). THere is associated cough but only episodic.  There is no edema, hemoptysis, unintenional weight loss.   Walking desaturation test on 04/23/2012 185 feet x 3 laps:  did NOT desaturate. Rest pulse ox was 97%, final pulse ox was 94%. HR response 84/min at rest to 86/min at peak exertion.   RElevant hx   Fused L2-L5 x 3 spine surgery due to disc disease, osteoporosis, and also fall, various reasons  LABS - cxr sept 2012: clear lung fields - hgb nov 2012: 11.7gm%  - cardiac 04/01/12: Normal stress test with normal EF - no hx of PFTs  REC #Shortness of breath  - our check list says latex allergy is cautionary for flu shot; do not know why but we will defer flu shot today  - CMA/RN will walk you for oxygen levels now 04/22/2012  - please have full PFT breathing test at [place] or [place] or [place] at your convenience;  soon after doing it call us at [phone] and pass message on so I can review and advise next step      Telephone calls Dec - FEb 2014 PFT 05/27/12 is normal ecwpt mild reduction in diffusion (74%) and 8% BD response. These could be freatures of early emphysema, asthma, or scarring in lungs or normal variant. To better understand this - do HRCT chest. IF this is normal, needs methacholine challenge test for asthma and if that too is normal needs bike pulmonary stress test. She is aware of this algorithm. I will stay in touch by phone. For now, get CT chest (ordered)  CT chest does not show reason for shortness of breath. Next to do is methacholine challenge test; ordered. She is do and give [place] a call so I can review it. She know the algorithm. CT does show 71mm lung nodules -very very very rare < 1:1000 chance it is serious like cancer. She needs repeat ct chest in 1 year without cotnrast (ordered)  Methacholine challenge test on 08/07/2012 is positive for asthma. PD 20 at less than 4.0. No flow volume loop issues   OV 09/24/2012  Followup 3 mm lung nodule and Followup for dyspnea on the basis of positive methacholine challenge test   At the end of last visit she had diagnostic workup. She has been diagnosed with 3 mm lung nodule and also positive methacholine challenge test. For  the past few weeks she is taking Symbicort 2 puff twice daily. This is helping with her symptoms. She is absolutely petrified about developing lung cancer. She is upset that her husband and son continued to smoke and had a house and this is an etiological agent for lung cancer. She is also worried that this will make her asthma worse. I have agreed with her sentiments. There are no new issues   Past, Family, Social reviewed: no change since last visit other than the fact she had disc fusion surgery by Dr. Joya Salm   OV 07/01/2013  Chief Complaint  Patient presents with  . Follow-up    to review CT results. Pt states she  has been using xopenex at least once daily and also has albuterol neb that she is using 2-3 times a week.     FU  - asthma ( reports that she has never smoked. She has never used smokeless tobacco. But PASSIVE SMOKER +)   - lung nodule   In terms of asthma  - Noiv 2014 had AE-asthma due to virus. Rx by PCP. She is upset that hsuband and son do not listen to her and contiue to expose her to smoke and places hjer at risk for Ae-asthma. I have offered to counsel them at a future visit. She is out of symbicort but is compliant. She prefers xopenex mdi and can afford it. Wants scripts for both. Uptodate with flu shot  In terms of nodule  - stable 3 mm nodules at base Jan 2014 through Jan 2015. She is worried about cancer risk due to passive smoking and prefers followup Scans.   CT chest 06/24/13  CT CHEST WITHOUT CONTRAST Jan 2015 TECHNIQUE:  Multidetector CT imaging of the chest was performed following the  standard protocol without IV contrast.  COMPARISON: Chest CT 07/19/2012.  FINDINGS:  Mediastinum: Heart size is normal. There is no significant  pericardial fluid, thickening or pericardial calcification. No  pathologically enlarged mediastinal or hilar lymph nodes. Please  note that accurate exclusion of hilar adenopathy is limited on  noncontrast CT scans. Esophagus is unremarkable in appearance.  Lungs/Pleura: A few scattered 2-3 mm pulmonary nodules are again  seen throughout the lung bases bilaterally, similar in size, number  and distribution to the prior examination, favored to represent  benign disease such as mucoid impaction within terminal bronchioles.  No other larger more suspicious appearing pulmonary nodules or  masses are otherwise noted. No acute consolidative airspace disease.  No pleural effusions. Areas of mild scarring are noted in the right  middle lobe and inferior segment of the lingula (unchanged).  Upper Abdomen: Status post cholecystectomy.   Musculoskeletal: There are no aggressive appearing lytic or blastic  lesions noted in the visualized portions of the skeleton. Old  compression fracture of L1 with post vertebroplasty changes  incompletely imaged.  IMPRESSION:  1. No change in multiple tiny 2-3 mm nodules throughout the lung  bases bilaterally. These findings are compatible with benign  disease, presumably areas of mucoid impaction within terminal  bronchioles. No further followup is suggested.  2. No acute findings in the thorax.  Electronically Signed  By: Vinnie Langton M.D.  On: 06/24/2013 15:14        Review of Systems  Constitutional: Negative for fever and unexpected weight change.  HENT: Negative for congestion, dental problem, ear pain, nosebleeds, postnasal drip, rhinorrhea, sinus pressure, sneezing, sore throat and trouble swallowing.   Eyes: Negative for redness and itching.  Respiratory:  Negative for cough, chest tightness, shortness of breath and wheezing.   Cardiovascular: Negative for palpitations and leg swelling.  Gastrointestinal: Negative for nausea and vomiting.  Genitourinary: Negative for dysuria.  Musculoskeletal: Negative for joint swelling.  Skin: Negative for rash.  Neurological: Negative for headaches.  Hematological: Does not bruise/bleed easily.  Psychiatric/Behavioral: Negative for dysphoric mood. The patient is not nervous/anxious.    Current outpatient prescriptions:albuterol (PROVENTIL) (2.5 MG/3ML) 0.083% nebulizer solution, Take 2.5 mg by nebulization every 6 (six) hours as needed for wheezing or shortness of breath., Disp: , Rfl: ;  amitriptyline (ELAVIL) 100 MG tablet, Take 1 tablet (100 mg total) by mouth at bedtime., Disp: 30 tablet, Rfl: 4;  aspirin EC 81 MG tablet, Take 81 mg by mouth See admin instructions. Every other day., Disp: , Rfl:  Biotin 10 MG CAPS, Take 10 mg by mouth daily., Disp: , Rfl: ;  bisoprolol-hydrochlorothiazide (ZIAC) 10-6.25 MG per tablet, Take 1  tablet by mouth daily., Disp: , Rfl: ;  budesonide-formoterol (SYMBICORT) 80-4.5 MCG/ACT inhaler, Inhale 2 puffs into the lungs 2 (two) times daily., Disp: 1 Inhaler, Rfl: 0;  Calcium Carbonate-Vit D-Min 1200-1000 MG-UNIT CHEW, Chew 1 tablet by mouth 2 (two) times daily. , Disp: , Rfl:  cyclobenzaprine (FLEXERIL) 10 MG tablet, Take 10 mg by mouth 2 (two) times daily as needed for muscle spasms. , Disp: , Rfl: ;  diazepam (VALIUM) 10 MG tablet, Take 10 mg by mouth 3 (three) times daily as needed (muscle spasms). , Disp: , Rfl: ;  esomeprazole (NEXIUM) 40 MG capsule, Take 40 mg by mouth every morning. , Disp: , Rfl: ;  estrogens, conjugated, (PREMARIN) 1.25 MG tablet, Take 1.25 mg by mouth every morning. , Disp: , Rfl:  gabapentin (NEURONTIN) 600 MG tablet, Take 1 tablet (600 mg total) by mouth 3 (three) times daily., Disp: 90 tablet, Rfl: 2;  ibandronate (BONIVA) 150 MG tablet, Take 150 mg by mouth every 30 (thirty) days. Take in the morning with a full glass of water, on an empty stomach, and do not take anything else by mouth or lie down for the next 30 min., Disp: , Rfl:  levalbuterol (XOPENEX HFA) 45 MCG/ACT inhaler, Inhale 1-2 puffs into the lungs every 4 (four) hours as needed. For shortness of breath, Disp: , Rfl: ;  oxyCODONE (ROXICODONE) 15 MG immediate release tablet, Take 1 tablet (15 mg total) by mouth every 6 (six) hours as needed. For pain, Disp: 120 tablet, Rfl: 0;  oxymorphone (OPANA ER) 40 MG 12 hr tablet, Take 1 tablet (40 mg total) by mouth every 12 (twelve) hours., Disp: 60 tablet, Rfl: 0 valACYclovir (VALTREX) 500 MG tablet, Take 1,000 mg by mouth 2 (two) times daily. , Disp: , Rfl: ;  vitamin B-12 (CYANOCOBALAMIN) 1000 MCG tablet, Take 1,000 mcg by mouth daily. , Disp: , Rfl:  No current facility-administered medications for this visit. Facility-Administered Medications Ordered in Other Visits: levalbuterol (XOPENEX) nebulizer solution 0.63 mg, 0.63 mg, Nebulization, Once, Tammy S Parrett,  NP     Objective:   Physical Exam  Vitals reviewed. Constitutional: She is oriented to person, place, and time. She appears well-developed and well-nourished. No distress.  HENT:  Head: Normocephalic and atraumatic.  Right Ear: External ear normal.  Left Ear: External ear normal.  Mouth/Throat: Oropharynx is clear and moist. No oropharyngeal exudate.  Mild post nasal drainage  Eyes: Conjunctivae and EOM are normal. Pupils are equal, round, and reactive to light. Right eye exhibits no discharge. Left eye exhibits  no discharge. No scleral icterus.  Neck: Normal range of motion. Neck supple. No JVD present. No tracheal deviation present. No thyromegaly present.  Cardiovascular: Normal rate, regular rhythm, normal heart sounds and intact distal pulses.  Exam reveals no gallop and no friction rub.   No murmur heard. Pulmonary/Chest: Effort normal and breath sounds normal. No respiratory distress. She has no wheezes. She has no rales. She exhibits no tenderness.  Abdominal: Soft. Bowel sounds are normal. She exhibits no distension and no mass. There is no tenderness. There is no rebound and no guarding.  Musculoskeletal: Normal range of motion. She exhibits no edema and no tenderness.  Rt wrist in splint  Lymphadenopathy:    She has no cervical adenopathy.  Neurological: She is alert and oriented to person, place, and time. She has normal reflexes. No cranial nerve deficit. She exhibits normal muscle tone. Coordination normal.  Skin: Skin is warm and dry. No rash noted. She is not diaphoretic. No erythema. No pallor.  Psychiatric: She has a normal mood and affect. Her behavior is normal. Judgment and thought content normal.          Assessment & Plan:

## 2013-07-01 NOTE — Patient Instructions (Addendum)
#  lung nodule  -= small 9mm and stable Jan 2014 - Jan 2015  - given your concern for lung cancer, next CT chest wo contrast June 2016  #Asthma   - glad is stable  - continue symbicort  - change albuterol to xopenex mdi  #Passive smoking  -= dangerous for you to be exposed to husband and son cigarette smoke due to above risks  - pleease bring them with you next visit so I can counsel them  #FOllowup 6-9 months or sooner if needed

## 2013-07-06 NOTE — Assessment & Plan Note (Signed)
#  lung nodule  -= small 69mm and stable Jan 2014 - Jan 2015  - given your concern for lung cancer, next CT chest wo contrast June 2016  #FOllowup 6-9 months or sooner if needed

## 2013-07-06 NOTE — Assessment & Plan Note (Signed)
#  Asthma   - glad is stable  - continue symbicort  - change albuterol to xopenex mdi  #Passive smoking  -= dangerous for you to be exposed to husband and son cigarette smoke due to above risks  - pleease bring them with you next visit so I can counsel them

## 2013-07-15 ENCOUNTER — Other Ambulatory Visit: Payer: Self-pay | Admitting: *Deleted

## 2013-07-15 DIAGNOSIS — M5412 Radiculopathy, cervical region: Secondary | ICD-10-CM

## 2013-07-15 DIAGNOSIS — M961 Postlaminectomy syndrome, not elsewhere classified: Secondary | ICD-10-CM

## 2013-07-15 DIAGNOSIS — G039 Meningitis, unspecified: Secondary | ICD-10-CM

## 2013-07-15 MED ORDER — OXYMORPHONE HCL ER 40 MG PO TB12: 40.0000 mg | ORAL_TABLET | Freq: Two times a day (BID) | ORAL | Status: DC

## 2013-07-15 MED ORDER — OXYCODONE HCL 15 MG PO TABS: 15.0000 mg | ORAL_TABLET | Freq: Four times a day (QID) | ORAL | Status: DC | PRN

## 2013-07-15 NOTE — Telephone Encounter (Signed)
RX printed early for controlled medication for the visit with RN on 07/22/13 (to be signed by MD)

## 2013-07-22 ENCOUNTER — Encounter: Payer: Medicare Other | Attending: Physical Medicine & Rehabilitation | Admitting: *Deleted

## 2013-07-22 ENCOUNTER — Encounter: Payer: Self-pay | Admitting: *Deleted

## 2013-07-22 VITALS — BP 146/68 | HR 83 | Resp 14

## 2013-07-22 DIAGNOSIS — M810 Age-related osteoporosis without current pathological fracture: Secondary | ICD-10-CM | POA: Insufficient documentation

## 2013-07-22 DIAGNOSIS — M961 Postlaminectomy syndrome, not elsewhere classified: Secondary | ICD-10-CM

## 2013-07-22 DIAGNOSIS — R0602 Shortness of breath: Secondary | ICD-10-CM | POA: Insufficient documentation

## 2013-07-22 DIAGNOSIS — J45909 Unspecified asthma, uncomplicated: Secondary | ICD-10-CM | POA: Insufficient documentation

## 2013-07-22 DIAGNOSIS — Z981 Arthrodesis status: Secondary | ICD-10-CM | POA: Diagnosis not present

## 2013-07-22 DIAGNOSIS — R911 Solitary pulmonary nodule: Secondary | ICD-10-CM | POA: Insufficient documentation

## 2013-07-22 NOTE — Progress Notes (Signed)
Here for pill count and medication refills. Oxycodone 15 mg #120 Fill date  06/28/13  Today NV# 23  Opana ER 40 mg # 60.  Fill date 07/06/13  Today NV# 27  VSS    Pain level:8  She says she is having much more pain. Her back pain is going down the side of her leg and now is going down front of thigh, both stopping at the knee. She is also having some issues.  She says twice that she has had an episode  Where her neck hurts and she cannot turn her head.  She is scheduled to see Dr Naaman Plummer next month and will discuss this with him. She has been having some balance issues as well, stumbling  but not falling.  Her fall risk is moderate.  Educated and handout on fall prevention at home was given.

## 2013-07-22 NOTE — Patient Instructions (Signed)
Follow up in one month with Dr Naaman Plummer 08/19/13 @ 11:40

## 2013-08-15 ENCOUNTER — Other Ambulatory Visit: Payer: Self-pay | Admitting: *Deleted

## 2013-08-15 DIAGNOSIS — M5412 Radiculopathy, cervical region: Secondary | ICD-10-CM

## 2013-08-15 DIAGNOSIS — M961 Postlaminectomy syndrome, not elsewhere classified: Secondary | ICD-10-CM

## 2013-08-15 DIAGNOSIS — M549 Dorsalgia, unspecified: Secondary | ICD-10-CM | POA: Diagnosis not present

## 2013-08-15 DIAGNOSIS — G039 Meningitis, unspecified: Secondary | ICD-10-CM

## 2013-08-15 MED ORDER — OXYCODONE HCL 15 MG PO TABS: 15.0000 mg | ORAL_TABLET | Freq: Four times a day (QID) | ORAL | Status: DC | PRN

## 2013-08-15 MED ORDER — OXYMORPHONE HCL ER 40 MG PO TB12: 40.0000 mg | ORAL_TABLET | Freq: Two times a day (BID) | ORAL | Status: DC

## 2013-08-15 NOTE — Telephone Encounter (Signed)
RX printed early for controlled medication for the visit with RN on 08/18/13 (to be signed by MD) 

## 2013-08-18 ENCOUNTER — Encounter: Payer: Medicare Other | Attending: Physical Medicine & Rehabilitation | Admitting: *Deleted

## 2013-08-18 ENCOUNTER — Encounter: Payer: Self-pay | Admitting: *Deleted

## 2013-08-18 VITALS — BP 133/45 | HR 84 | Resp 14 | Wt 181.6 lb

## 2013-08-18 DIAGNOSIS — F3289 Other specified depressive episodes: Secondary | ICD-10-CM | POA: Insufficient documentation

## 2013-08-18 DIAGNOSIS — F329 Major depressive disorder, single episode, unspecified: Secondary | ICD-10-CM | POA: Diagnosis not present

## 2013-08-18 DIAGNOSIS — M542 Cervicalgia: Secondary | ICD-10-CM | POA: Diagnosis not present

## 2013-08-18 DIAGNOSIS — G5790 Unspecified mononeuropathy of unspecified lower limb: Secondary | ICD-10-CM | POA: Diagnosis not present

## 2013-08-18 DIAGNOSIS — F411 Generalized anxiety disorder: Secondary | ICD-10-CM | POA: Diagnosis not present

## 2013-08-18 DIAGNOSIS — M25559 Pain in unspecified hip: Secondary | ICD-10-CM | POA: Insufficient documentation

## 2013-08-18 DIAGNOSIS — Z981 Arthrodesis status: Secondary | ICD-10-CM | POA: Diagnosis not present

## 2013-08-18 DIAGNOSIS — IMO0002 Reserved for concepts with insufficient information to code with codable children: Secondary | ICD-10-CM | POA: Insufficient documentation

## 2013-08-18 DIAGNOSIS — M961 Postlaminectomy syndrome, not elsewhere classified: Secondary | ICD-10-CM | POA: Insufficient documentation

## 2013-08-18 NOTE — Progress Notes (Signed)
Here for pill count and medication refills. Oxycodone 15 mg #120 Fill date 07/28/13   Today NV#35 Oxymorphone ER 40mg  #60  Fill date 08/05/13 Today NV# 33   VSS    Pain  In back is still spreading down legs and now down front of her thighs.  She needs to see Dr Naaman Plummer but unfortunately she had to cancel the las appts scheduled with him.  They will try to get her in with him asap for evaluation.  She has not had any falls but does stumble a lot now. She was educated and given the handout last visit and has been trying to follow its information.  Return in one month to see Naaman Plummer

## 2013-08-18 NOTE — Patient Instructions (Signed)
Follow up with Dr Naaman Plummer

## 2013-08-19 ENCOUNTER — Ambulatory Visit: Payer: Medicare Other | Admitting: Physical Medicine & Rehabilitation

## 2013-09-12 ENCOUNTER — Ambulatory Visit: Payer: Medicare Other | Admitting: Physical Medicine & Rehabilitation

## 2013-09-25 ENCOUNTER — Encounter: Payer: Self-pay | Admitting: Registered Nurse

## 2013-09-25 ENCOUNTER — Ambulatory Visit: Payer: Medicare Other | Admitting: Physical Medicine and Rehabilitation

## 2013-09-25 ENCOUNTER — Encounter: Payer: Medicare Other | Attending: Physical Medicine & Rehabilitation | Admitting: Registered Nurse

## 2013-09-25 VITALS — BP 151/73 | HR 91 | Resp 14 | Ht 64.0 in | Wt 181.0 lb

## 2013-09-25 DIAGNOSIS — G039 Meningitis, unspecified: Secondary | ICD-10-CM | POA: Diagnosis not present

## 2013-09-25 DIAGNOSIS — F341 Dysthymic disorder: Secondary | ICD-10-CM

## 2013-09-25 DIAGNOSIS — M5412 Radiculopathy, cervical region: Secondary | ICD-10-CM | POA: Diagnosis not present

## 2013-09-25 DIAGNOSIS — M961 Postlaminectomy syndrome, not elsewhere classified: Secondary | ICD-10-CM

## 2013-09-25 DIAGNOSIS — F418 Other specified anxiety disorders: Secondary | ICD-10-CM

## 2013-09-25 MED ORDER — GABAPENTIN 600 MG PO TABS: 600.0000 mg | ORAL_TABLET | Freq: Three times a day (TID) | ORAL | Status: DC

## 2013-09-25 MED ORDER — OXYCODONE HCL 15 MG PO TABS: 15.0000 mg | ORAL_TABLET | Freq: Four times a day (QID) | ORAL | Status: DC | PRN

## 2013-09-25 MED ORDER — OXYMORPHONE HCL ER 40 MG PO TB12: 40.0000 mg | ORAL_TABLET | Freq: Two times a day (BID) | ORAL | Status: DC

## 2013-09-25 NOTE — Patient Instructions (Signed)
Please call Dr. Joya Salm for follow up appointment.   Go for your Myelogram when they call you.

## 2013-09-25 NOTE — Progress Notes (Signed)
Subjective:    Patient ID: [name] [name], female    DOB: 10-13-1950, 63 y.o.   MRN: [id number]  HPI: [name] [name] is a 63 year old female who returns for follow up for chronic pain and medication refill. She's complaining of middle to lower back pain, right hip and bilaterally lower extremities lateral pain. Her pain has increased in intensity, numbness and tingling in her lower extremities. Also her legs feel like their burning all the time. She rates her pain 9/10. She feels as though the pain medication just dulls the pain for 5-6 hours depending on her activity of the day. She uses heat treatment everyday. Having muscle spasms she's alternating flexeril, valium,this offers better control of her muscle spasms.   She fell last week trying to get her cat. She fell in her living room and landed on her side, she was unable to brace herself during the fall. She didn't seek medical attention. She noticed she has been stumbling frequently and had trouble getting out of bed last week, she had to roll out of bed and adjust her position. She was in a hunch back position took a while before she was able to stand up straight.Also, while she was sitting on the floor a week ago feeding her dog, all of sudden she felt her lower extremities feel prickly with burning sensation. She's afraid she's going to fall, she uses a cane (her son's). Cane readjusted for accuracy. She heard a pop in her back a few weeks ago, every since then she's been having increase pain and numbness, tingling, and prickling pain.  Reviewing her chart for the last five months she has been complaining of more neuropathy pain, stumbling and increase in intensity of pain. Recommended she sees Dr. Joya Salm for appointment. She verbalized understanding.  Myleogram ordered.  Pain Inventory Average Pain 8 Pain Right Now 9 My pain is sharp, burning, tingling and aching  In the last 24 hours, has pain interfered with the following? General  activity 3 Relation with others 2 Enjoyment of life 2 What TIME of day is your pain at its worst? constant Sleep (in general) Fair  Pain is worse with: walking, bending, sitting, standing and some activites Pain improves with: heat/ice and medication Relief from Meds: 6  Mobility use a cane how many minutes can you walk? 3-5 ability to climb steps?  yes do you drive?  yes  Function not employed: date last employed .  Neuro/Psych numbness tingling trouble walking spasms anxiety  Prior Studies Any changes since last visit?  no  Physicians involved in your care Any changes since last visit?  no   Family History  Problem Relation Age of Onset  . Hypertension Mother   . Heart disease Mother   . Diabetes Mother   . Cancer Father   . Diabetes Brother    History   Social History  . Marital Status: Married    Spouse Name: N/A    Number of Children: N/A  . Years of Education: N/A   Occupational History  . disable    Social History Main Topics  . Smoking status: Never Smoker   . Smokeless tobacco: Never Used  . Alcohol Use: No  . Drug Use: No  . Sexual Activity: None     Comment: second hand smoke   Other Topics Concern  . None   Social History Narrative   Has one child   Disabled   Past Surgical History  Procedure  Laterality Date  . Lumbar fusion  11-30-10    L4 - 5  . Exploration of incision for csf leak  DEC 2011  X2    POST LAMINECOTMY  . Lumbar laminectomy  DEC 2011    L4 - 5  . Lumbar fusion  2000    L2 - 4  . Cervical disc surgery  2005    C5 - 6  . Anterior fusion cervical spine  2007    C5 -6  . Tendon repair  JAN 2010    LEFT INDEX AND LONG FINGERS  . Left wrist tenosynectomy w/ left thumb joint repair  02-24-10  . Abdominal hysterectomy  1987    W/ BSO  . Cholecystectomy  1994  . Knee arthroscopy  LEFT X3 (LAST ONE 2005)  . Carpal tunnel release  RIGHT - 2001  & DEC 2011 W/ BACK SURG.  . Knee arthroscopy  05/17/2011     Procedure: ARTHROSCOPY KNEE;  Surgeon: Bradley Ferris III;  Location: [facility];  Service: Orthopedics;  Laterality: Right;  WITH MEDIAL MENISECTOMY AND removal of suprapatella fat lump  . Anterior cervical decomp/discectomy fusion N/A 08/09/2012    Procedure: ANTERIOR CERVICAL DECOMPRESSION/DISCECTOMY FUSION 1 LEVEL;  Surgeon: Floyce Stakes, MD;  Location: [facility];  Service: Neurosurgery;  Laterality: N/A;  Cervical four-five  Anterior cervical decompression/diskectomy/fusion  . Finger arthrodesis Right 04/07/2013    Procedure: RIGHT INDEX AND RIGHT LONG DISTAL INTERPHALANGEAL JOINT FUSIONS;  Surgeon: Schuyler Amor, MD;  Location: [facility];  Service: Orthopedics;  Laterality: Right;  . Finger arthroplasty Right 04/07/2013    Procedure: RIGHT THUMB [employer] ARTHROPLASTY;  Surgeon: Schuyler Amor, MD;  Location: [facility];  Service: Orthopedics;  Laterality: Right;  . Dorsal compartment release Right 04/07/2013    Procedure: RIGHT WRIST STS RELEASE;  Surgeon: Schuyler Amor, MD;  Location: [facility];  Service: Orthopedics;  Laterality: Right;  . Ganglion cyst excision Right 04/07/2013    Procedure: RIGHT WRIST MASS EXCISION;  Surgeon: Schuyler Amor, MD;  Location: [place];  Service: Orthopedics;  Laterality: Right;   Past Medical History  Diagnosis Date  . Osteoporosis   . Varicosities     venous  . Cardiomyopathy HX --06/2010    EF was 25% during acute illness (PHELONEPHRITIS) Repeat echo 12-06-10 60% showed normal EF.   Marland Kitchen Hypertension   . Headache(784.0)   . PONV (postoperative nausea and vomiting)   . Heart murmur     DENIES S & S   (ECHO JUN'12 W/ CHART)  . History of chronic bronchitis   . Chronic back pain greater than 3 months duration     S/P BACK SURG'S  . Arachnoiditis BILATERAL LEGS    DUE TO MULTIPLE BACK SURG.'S  . Weakness of both legs DUE TO ARACHNOIDITIS    OCCASIONAL  USES CANE  . Blood transfusion   . GERD (gastroesophageal reflux disease) AND HIATIAL HERNIA    CONTROLLED W/ NEXIUM  . Acute sinusitis, unspecified   . Anxiety state, unspecified   . Essential hypertension, benign   . Dyslipidemia   . Other malaise and fatigue   . Murmur, heart   . Shortness of breath   . Neuromuscular disorder     numbness and tingling  . Arthritis   . Hx of bladder infections   . Dysphagia     some post op cerv fusion 2/14  . Asthma  BP 151/73  Pulse 91  Resp 14  Ht 5\' 4"  (1.626 m)  Wt 181 lb (82.101 kg)  BMI 31.05 kg/m2  SpO2 98%  Opioid Risk Score:   Fall Risk Score: High Fall Risk (>13 points) (patient educated handout declined)   Review of Systems  Musculoskeletal: Positive for arthralgias, back pain, gait problem, myalgias and neck pain.  Neurological: Positive for numbness.       Tingling  Psychiatric/Behavioral: The patient is nervous/anxious.   All other systems reviewed and are negative.      Objective:   Physical Exam  Nursing note and vitals reviewed. Constitutional: She appears well-developed and well-nourished.  HENT:  Head: Normocephalic.  Neck: Neck supple.  Cardiovascular: Normal rate, regular rhythm and normal heart sounds.   Pulmonary/Chest: Effort normal and breath sounds normal.  Musculoskeletal:  Normal Muscle Bulk. Muscle testing reveals: Upper Extremities. Right 3/5, Left 4/5. Lower Extremities: Adduction: 3/5 Abduction 3/5. Decrease ROM to Lower Extremities 25-30 degrees. Narrow gait with hip elevation. Romberg: Negative When she walks without the cane she is unbalanced. Walking with cane gives her balance and support. When arises from chair in a standing position, bilateral legs tremors noted..           Assessment & Plan:  ASSESSMENT:  1. 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  Refilled: Oxycodone 15mg  #120, Opana 40 mg # 60 Increase Pain/ Falls/ Myelogram  Ordered 2. 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 Continue  gabapentin 600mg  TID.  Encourage Posture and Stretching Exercise. 3.  Anxiety/depression: Declines antidepressant at this time.Encouraged Counseling. Continue to monitor.

## 2013-09-29 ENCOUNTER — Other Ambulatory Visit: Payer: Self-pay | Admitting: Registered Nurse

## 2013-09-29 DIAGNOSIS — M961 Postlaminectomy syndrome, not elsewhere classified: Secondary | ICD-10-CM

## 2013-09-29 DIAGNOSIS — G039 Meningitis, unspecified: Secondary | ICD-10-CM

## 2013-10-01 ENCOUNTER — Ambulatory Visit
Admission: RE | Admit: 2013-10-01 | Discharge: 2013-10-01 | Disposition: A | Discharge status: Home / Self Care | Payer: Medicare Other | Source: Ambulatory Visit | Attending: Registered Nurse | Admitting: Registered Nurse

## 2013-10-01 ENCOUNTER — Other Ambulatory Visit: Payer: Self-pay | Admitting: Registered Nurse

## 2013-10-01 DIAGNOSIS — M545 Low back pain, unspecified: Secondary | ICD-10-CM | POA: Diagnosis not present

## 2013-10-01 DIAGNOSIS — M961 Postlaminectomy syndrome, not elsewhere classified: Secondary | ICD-10-CM

## 2013-10-01 DIAGNOSIS — G039 Meningitis, unspecified: Secondary | ICD-10-CM

## 2013-10-01 DIAGNOSIS — M5126 Other intervertebral disc displacement, lumbar region: Secondary | ICD-10-CM | POA: Diagnosis not present

## 2013-10-01 MED ORDER — IOHEXOL 180 MG/ML  SOLN
17.0000 mL | Freq: Once | INTRAMUSCULAR | Status: AC | PRN
  Administered 2013-10-01: 17 mL via INTRATHECAL

## 2013-10-01 NOTE — Discharge Instructions (Signed)
Myelogram Discharge Instructions  1. Go home and rest quietly for the next 24 hours.  It is important to lie flat for the next 24 hours.  Get up only to go to the restroom.  You may lie in the bed or on a couch on your back, your stomach, your left side or your right side.  You may have one pillow under your head.  You may have pillows between your knees while you are on your side or under your knees while you are on your back.  2. DO NOT drive today.  Recline the seat as far back as it will go, while still wearing your seat belt, on the way home.  3. You may get up to go to the bathroom as needed.  You may sit up for 10 minutes to eat.  You may resume your normal diet and medications unless otherwise indicated.  Drink lots of extra fluids today and tomorrow.  4. The incidence of headache, nausea, or vomiting is about 5% (one in 20 patients).  If you develop a headache, lie flat and drink plenty of fluids until the headache goes away.  Caffeinated beverages may be helpful.  If you develop severe nausea and vomiting or a headache that does not go away with flat bed rest, call [phone].  5. You may resume normal activities after your 24 hours of bed rest is over; however, do not exert yourself strongly or do any heavy lifting tomorrow. If when you get up you have a headache when standing, go back to bed and force fluids for another 24 hours.  6. Call your physician for a follow-up appointment.  The results of your myelogram will be sent directly to your physician by the following day.  7. If you have any questions or if complications develop after you arrive home, please call [phone].  Discharge instructions have been explained to the patient.  The patient, or the person responsible for the patient, fully understands these instructions.      May resume Amitriptyline on October 02, 2013, after 1:00 pm.

## 2013-10-01 NOTE — Progress Notes (Signed)
Patient states she has been off Amitriptyline for the past two days.  Brita Romp, RN

## 2013-10-16 DIAGNOSIS — Z6830 Body mass index (BMI) 30.0-30.9, adult: Secondary | ICD-10-CM | POA: Diagnosis not present

## 2013-10-16 DIAGNOSIS — I10 Essential (primary) hypertension: Secondary | ICD-10-CM | POA: Diagnosis not present

## 2013-10-16 DIAGNOSIS — M5137 Other intervertebral disc degeneration, lumbosacral region: Secondary | ICD-10-CM | POA: Diagnosis not present

## 2013-10-22 ENCOUNTER — Encounter: Payer: Self-pay | Admitting: Physical Medicine & Rehabilitation

## 2013-10-22 ENCOUNTER — Encounter: Payer: Medicare Other | Attending: Physical Medicine & Rehabilitation | Admitting: Physical Medicine & Rehabilitation

## 2013-10-22 VITALS — BP 110/71 | HR 71 | Resp 14 | Ht 64.0 in | Wt 181.0 lb

## 2013-10-22 DIAGNOSIS — M961 Postlaminectomy syndrome, not elsewhere classified: Secondary | ICD-10-CM | POA: Insufficient documentation

## 2013-10-22 DIAGNOSIS — M5412 Radiculopathy, cervical region: Secondary | ICD-10-CM | POA: Insufficient documentation

## 2013-10-22 DIAGNOSIS — G039 Meningitis, unspecified: Secondary | ICD-10-CM | POA: Diagnosis not present

## 2013-10-22 MED ORDER — OXYMORPHONE HCL ER 40 MG PO TB12: 40.0000 mg | ORAL_TABLET | Freq: Two times a day (BID) | ORAL | Status: DC

## 2013-10-22 MED ORDER — OXYCODONE HCL 15 MG PO TABS: 15.0000 mg | ORAL_TABLET | Freq: Four times a day (QID) | ORAL | Status: DC | PRN

## 2013-10-22 MED ORDER — AMITRIPTYLINE HCL 100 MG PO TABS: 100.0000 mg | ORAL_TABLET | Freq: Every day | ORAL | Status: DC

## 2013-10-22 NOTE — Progress Notes (Signed)
Subjective:    Patient ID: [name] [name], female    DOB: 1950-07-04, 63 y.o.   MRN: [id number]  HPI  [name] [name] is back regarding her chronic pain. She has noticed more pain along the front of her legs. Otherwise her pain is persistent along the posterior aspects of her legs.  She saw Dr. Joya Salm last week who has ordered a CT myelogram of the C-spine as he saw something "he didn't like". She had a lumbar myelogram last month which shows ongoing arachnoiditis.   She has noticed some trembling at times in her legs. She is using a cane for balance. She is not moving around as much as she once did. She has not noticed any problems with her bladder or bowels.  Her pain meds help somewhat and she's taking them as prescribed.        Pain Inventory Average Pain 8 Pain Right Now 9 My pain is constant, sharp, burning, stabbing and tingling  In the last 24 hours, has pain interfered with the following? General activity 10 Relation with others 9 Enjoyment of life 9 What TIME of day is your pain at its worst? constant Sleep (in general) Fair  Pain is worse with: walking, bending, sitting and standing Pain improves with: heat/ice and medication Relief from Meds: 7  Mobility use a cane how many minutes can you walk? 10 ability to climb steps?  yes do you drive?  yes  Function Do you have any goals in this area?  no  Neuro/Psych No problems in this area  Prior Studies Any changes since last visit?  no  Physicians involved in your care Any changes since last visit?  no   Family History  Problem Relation Age of Onset  . Hypertension Mother   . Heart disease Mother   . Diabetes Mother   . Cancer Father   . Diabetes Brother    History   Social History  . Marital Status: Married    Spouse Name: N/A    Number of Children: N/A  . Years of Education: N/A   Occupational History  . disable    Social History Main Topics  . Smoking status: Never Smoker   . Smokeless  tobacco: Never Used  . Alcohol Use: No  . Drug Use: No  . Sexual Activity: None     Comment: second hand smoke   Other Topics Concern  . None   Social History Narrative   Has one child   Disabled   Past Surgical History  Procedure Laterality Date  . Lumbar fusion  11-30-10    L4 - 5  . Exploration of incision for csf leak  DEC 2011  X2    POST LAMINECOTMY  . Lumbar laminectomy  DEC 2011    L4 - 5  . Lumbar fusion  2000    L2 - 4  . Cervical disc surgery  2005    C5 - 6  . Anterior fusion cervical spine  2007    C5 -6  . Tendon repair  JAN 2010    LEFT INDEX AND LONG FINGERS  . Left wrist tenosynectomy w/ left thumb joint repair  02-24-10  . Abdominal hysterectomy  1987    W/ BSO  . Cholecystectomy  1994  . Knee arthroscopy  LEFT X3 (LAST ONE 2005)  . Carpal tunnel release  RIGHT - 2001  & DEC 2011 W/ BACK SURG.  . Knee arthroscopy  05/17/2011    Procedure: ARTHROSCOPY KNEE;  Surgeon: Bradley Ferris III;  Location: [facility];  Service: Orthopedics;  Laterality: Right;  WITH MEDIAL MENISECTOMY AND removal of suprapatella fat lump  . Anterior cervical decomp/discectomy fusion N/A 08/09/2012    Procedure: ANTERIOR CERVICAL DECOMPRESSION/DISCECTOMY FUSION 1 LEVEL;  Surgeon: Floyce Stakes, MD;  Location: [facility];  Service: Neurosurgery;  Laterality: N/A;  Cervical four-five  Anterior cervical decompression/diskectomy/fusion  . Finger arthrodesis Right 04/07/2013    Procedure: RIGHT INDEX AND RIGHT LONG DISTAL INTERPHALANGEAL JOINT FUSIONS;  Surgeon: Schuyler Amor, MD;  Location: [place];  Service: Orthopedics;  Laterality: Right;  . Finger arthroplasty Right 04/07/2013    Procedure: RIGHT THUMB [place] ARTHROPLASTY;  Surgeon: Schuyler Amor, MD;  Location: [facility];  Service: Orthopedics;  Laterality: Right;  . Dorsal compartment release Right 04/07/2013    Procedure: RIGHT WRIST STS RELEASE;  Surgeon: Schuyler Amor, MD;  Location: [place];  Service: Orthopedics;  Laterality: Right;  . Ganglion cyst excision Right 04/07/2013    Procedure: RIGHT WRIST MASS EXCISION;  Surgeon: Schuyler Amor, MD;  Location: [place];  Service: Orthopedics;  Laterality: Right;   Past Medical History  Diagnosis Date  . Osteoporosis   . Varicosities     venous  . Cardiomyopathy HX --06/2010    EF was 25% during acute illness (PHELONEPHRITIS) Repeat echo 12-06-10 60% showed normal EF.   Marland Kitchen Hypertension   . Headache(784.0)   . PONV (postoperative nausea and vomiting)   . Heart murmur     DENIES S & S   (ECHO JUN'12 W/ CHART)  . History of chronic bronchitis   . Chronic back pain greater than 3 months duration     S/P BACK SURG'S  . Arachnoiditis BILATERAL LEGS    DUE TO MULTIPLE BACK SURG.'S  . Weakness of both legs DUE TO ARACHNOIDITIS    OCCASIONAL USES CANE  . Blood transfusion   . GERD (gastroesophageal reflux disease) AND HIATIAL HERNIA    CONTROLLED W/ NEXIUM  . Acute sinusitis, unspecified   . Anxiety state, unspecified   . Essential hypertension, benign   . Dyslipidemia   . Other malaise and fatigue   . Murmur, heart   . Shortness of breath   . Neuromuscular disorder     numbness and tingling  . Arthritis   . Hx of bladder infections   . Dysphagia     some post op cerv fusion 2/14  . Asthma    BP 110/71  Pulse 71  Resp 14  Ht 5\' 4"  (1.626 m)  Wt 181 lb (82.101 kg)  BMI 31.05 kg/m2  SpO2 100%  Opioid Risk Score:   Fall Risk Score: High Fall Risk (>13 points) (patient educated handout declined)   Review of Systems  Musculoskeletal: Positive for arthralgias, back pain, myalgias and neck pain.  All other systems reviewed and are negative.      Objective:   Physical Exam Constitutional: She is oriented to person, place, and time. She appears well-developed and well-nourished.  HENT:  Head: Normocephalic and atraumatic.  Eyes: Conjunctivae  and EOM are normal. Pupils are equal, round, and reactive to light.  Neck: Normal range of motion. Neck supple.  Cardiovascular: Normal rate and regular rhythm.  Pulmonary/Chest: Effort normal and breath sounds normal.  Abdominal: Soft. Bowel sounds are normal. She exhibits no distension.  Musculoskeletal: tenderness with palpation of both greater trochs Cervical back: She exhibits decreased range  of motion and tenderness.  Lumbar back: She exhibits decreased range of motion, tenderness, bony tenderness and spasm. She can bend at the waist to approximately 45 degrees before she begins to hurt. Neurological: She is alert and oriented to person, place, and time. No cranial nerve deficit.  She has some subjective, mild weakness in the upper extremities. Strength grossly 4/5 in UE's. She has more subjective weakness with ADF and APF bilaterally. Her right leg trembles when she stands. Still there are no consistent or focal sensory findings are seen in the arms. Lower extremities continues to show some mild sensory loss at 1-1+ out of 2, particularly below the knees. Inconsistent weakness in the legs with pain inhibition noted once again today.. Reflexes are generally trace to 1+ throughout both legs. UE DTR's are 2+. Gait is  wide based and perhaps a little antalgic on the right more than left. She is using a cane for support. Psychiatric: She has a normal mood and affect. Her behavior is normal. Judgment and thought content normal.    Assessment & Plan:   ASSESSMENT:  1. 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 She has documented C6 radiculitis in the past.  2. Post lumbar post laminectomy syndrome.  3. 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 Again, I don't see progression of symptoms/disease on exam. 4. Anxiety/depression  secondary to the above   PLAN:  1. Continue gabapentin 600mg  TID for nerve. She does not want to increase. 2. Spinal stimulator was discussed again today. She is not anxious to pursue. NS plan per Dr. Joya Salm.  3. Continue with Opana ER to 40mg  q12 #60  4. Will continue oxy IR 15mg  for breakthrough. q6 hour prn #120  5. Consider caudal block to help with symptoms. She will think about this moving forward. 6. Maintain elavil at 100mg  qhs --refilled today.  7. Flexeril and valium prn for spasms  8. The patient will see NP in one month. All questions were encouraged and answered.

## 2013-10-22 NOTE — Patient Instructions (Signed)
YOU NEED TO WORK ON REGULAR RANGE OF MOTION FOR YOUR BACK, PELVIS, LEGS

## 2013-10-23 ENCOUNTER — Other Ambulatory Visit: Payer: Self-pay | Admitting: Neurosurgery

## 2013-10-23 DIAGNOSIS — M5136 Other intervertebral disc degeneration, lumbar region: Secondary | ICD-10-CM

## 2013-10-30 ENCOUNTER — Other Ambulatory Visit: Payer: Medicare Other

## 2013-10-31 ENCOUNTER — Ambulatory Visit
Admission: RE | Admit: 2013-10-31 | Discharge: 2013-10-31 | Disposition: A | Discharge status: Home / Self Care | Payer: Medicare Other | Source: Ambulatory Visit | Attending: Neurosurgery | Admitting: Neurosurgery

## 2013-10-31 DIAGNOSIS — M5136 Other intervertebral disc degeneration, lumbar region: Secondary | ICD-10-CM

## 2013-10-31 DIAGNOSIS — M4802 Spinal stenosis, cervical region: Secondary | ICD-10-CM | POA: Diagnosis not present

## 2013-10-31 DIAGNOSIS — M5124 Other intervertebral disc displacement, thoracic region: Secondary | ICD-10-CM | POA: Diagnosis not present

## 2013-10-31 DIAGNOSIS — M4804 Spinal stenosis, thoracic region: Secondary | ICD-10-CM | POA: Diagnosis not present

## 2013-10-31 DIAGNOSIS — M549 Dorsalgia, unspecified: Secondary | ICD-10-CM | POA: Diagnosis not present

## 2013-10-31 MED ORDER — IOHEXOL 300 MG/ML  SOLN
10.0000 mL | Freq: Once | INTRAMUSCULAR | Status: AC | PRN
  Administered 2013-10-31: 10 mL via INTRATHECAL

## 2013-10-31 NOTE — Discharge Instructions (Signed)

## 2013-11-06 DIAGNOSIS — Z6830 Body mass index (BMI) 30.0-30.9, adult: Secondary | ICD-10-CM | POA: Diagnosis not present

## 2013-11-06 DIAGNOSIS — M47812 Spondylosis without myelopathy or radiculopathy, cervical region: Secondary | ICD-10-CM | POA: Diagnosis not present

## 2013-11-06 DIAGNOSIS — I10 Essential (primary) hypertension: Secondary | ICD-10-CM | POA: Diagnosis not present

## 2013-11-12 DIAGNOSIS — Z0000 Encounter for general adult medical examination without abnormal findings: Secondary | ICD-10-CM | POA: Diagnosis not present

## 2013-11-12 DIAGNOSIS — I10 Essential (primary) hypertension: Secondary | ICD-10-CM | POA: Diagnosis not present

## 2013-11-12 DIAGNOSIS — Z131 Encounter for screening for diabetes mellitus: Secondary | ICD-10-CM | POA: Diagnosis not present

## 2013-11-19 ENCOUNTER — Encounter: Payer: Medicare Other | Attending: Physical Medicine & Rehabilitation | Admitting: Registered Nurse

## 2013-11-19 ENCOUNTER — Encounter: Payer: Self-pay | Admitting: Registered Nurse

## 2013-11-19 VITALS — BP 137/82 | HR 79 | Resp 14 | Ht 64.0 in | Wt 181.0 lb

## 2013-11-19 DIAGNOSIS — M542 Cervicalgia: Secondary | ICD-10-CM

## 2013-11-19 DIAGNOSIS — G039 Meningitis, unspecified: Secondary | ICD-10-CM | POA: Diagnosis not present

## 2013-11-19 DIAGNOSIS — M961 Postlaminectomy syndrome, not elsewhere classified: Secondary | ICD-10-CM

## 2013-11-19 DIAGNOSIS — Z5181 Encounter for therapeutic drug level monitoring: Secondary | ICD-10-CM

## 2013-11-19 DIAGNOSIS — M5412 Radiculopathy, cervical region: Secondary | ICD-10-CM | POA: Diagnosis not present

## 2013-11-19 DIAGNOSIS — F419 Anxiety disorder, unspecified: Secondary | ICD-10-CM

## 2013-11-19 DIAGNOSIS — F411 Generalized anxiety disorder: Secondary | ICD-10-CM

## 2013-11-19 DIAGNOSIS — Z79899 Other long term (current) drug therapy: Secondary | ICD-10-CM

## 2013-11-19 MED ORDER — OXYCODONE HCL 15 MG PO TABS: 15.0000 mg | ORAL_TABLET | Freq: Four times a day (QID) | ORAL | Status: DC | PRN

## 2013-11-19 MED ORDER — OXYMORPHONE HCL ER 40 MG PO TB12: 40.0000 mg | ORAL_TABLET | Freq: Two times a day (BID) | ORAL | Status: DC

## 2013-11-19 NOTE — Progress Notes (Signed)
Subjective:    Patient ID: [name] [name], female    DOB: 1951-03-16, 63 y.o.   MRN: [id number]  HPI: [name] is a 63 year old female who returns for follow up for chronic pain and medication refill. She says her pain is located in her neck and back. She rates her pain 7. Her current exercise therapy is walking some and pool therapy. She also uses heat therapy. She says she had a fall two weeks ago, she was coming down the stairs of her pool and went down to her knees. She states she didn't lose consciousness her legs just gave out. She didn't seek medical attention. Also seen Dr. Joya Salm he told her her options are running out. They talked about the nerve stimulator and she is very ambivalent with this procedure. She says she feels as though she is at a crossroad not sure what to do. She will continue to speak to her PMD and Neurosurgeon regarding this matter. She's very sad that her son is know dealing with the same diagnosis with DJD and having excruciating pain. Emotional support given and all questions answered. Pain Inventory Average Pain 8 Pain Right Now 7 My pain is sharp, burning, stabbing and tingling  In the last 24 hours, has pain interfered with the following? General activity 8 Relation with others 7 Enjoyment of life 7 What TIME of day is your pain at its worst? constant all day Sleep (in general) Fair  Pain is worse with: walking, sitting, standing and some activites Pain improves with: heat/ice and medication Relief from Meds: 8  Mobility walk with assistance use a cane how many minutes can you walk? 10 ability to climb steps?  yes do you drive?  yes Do you have any goals in this area?  no  Function not employed: date last employed na  Neuro/Psych weakness numbness tremor tingling trouble walking spasms  Prior Studies Any changes since last visit?  yes CT/MRI myleogram  Physicians involved in your care Any changes since last visit?   yes Neurosurgeon [facility] and [facility]   Family History  Problem Relation Age of Onset  . Hypertension Mother   . Heart disease Mother   . Diabetes Mother   . Cancer Father   . Diabetes Brother    History   Social History  . Marital Status: Married    Spouse Name: N/A    Number of Children: N/A  . Years of Education: N/A   Occupational History  . disable    Social History Main Topics  . Smoking status: Never Smoker   . Smokeless tobacco: Never Used  . Alcohol Use: No  . Drug Use: No  . Sexual Activity: None     Comment: second hand smoke   Other Topics Concern  . None   Social History Narrative   Has one child   Disabled   Past Surgical History  Procedure Laterality Date  . Lumbar fusion  11-30-10    L4 - 5  . Exploration of incision for csf leak  DEC 2011  X2    POST LAMINECOTMY  . Lumbar laminectomy  DEC 2011    L4 - 5  . Lumbar fusion  2000    L2 - 4  . Cervical disc surgery  2005    C5 - 6  . Anterior fusion cervical spine  2007    C5 -6  . Tendon repair  JAN 2010    LEFT INDEX AND LONG  FINGERS  . Left wrist tenosynectomy w/ left thumb joint repair  02-24-10  . Abdominal hysterectomy  1987    W/ BSO  . Cholecystectomy  1994  . Knee arthroscopy  LEFT X3 (LAST ONE 2005)  . Carpal tunnel release  RIGHT - 2001  & DEC 2011 W/ BACK SURG.  . Knee arthroscopy  05/17/2011    Procedure: ARTHROSCOPY KNEE;  Surgeon: Bradley Ferris III;  Location: [facility];  Service: Orthopedics;  Laterality: Right;  WITH MEDIAL MENISECTOMY AND removal of suprapatella fat lump  . Anterior cervical decomp/discectomy fusion N/A 08/09/2012    Procedure: ANTERIOR CERVICAL DECOMPRESSION/DISCECTOMY FUSION 1 LEVEL;  Surgeon: Floyce Stakes, MD;  Location: [facility];  Service: Neurosurgery;  Laterality: N/A;  Cervical four-five  Anterior cervical decompression/diskectomy/fusion  . Finger arthrodesis Right 04/07/2013    Procedure: RIGHT INDEX AND RIGHT LONG DISTAL  INTERPHALANGEAL JOINT FUSIONS;  Surgeon: Schuyler Amor, MD;  Location: [facility];  Service: Orthopedics;  Laterality: Right;  . Finger arthroplasty Right 04/07/2013    Procedure: RIGHT THUMB [place] ARTHROPLASTY;  Surgeon: Schuyler Amor, MD;  Location: [place];  Service: Orthopedics;  Laterality: Right;  . Dorsal compartment release Right 04/07/2013    Procedure: RIGHT WRIST STS RELEASE;  Surgeon: Schuyler Amor, MD;  Location: [place];  Service: Orthopedics;  Laterality: Right;  . Ganglion cyst excision Right 04/07/2013    Procedure: RIGHT WRIST MASS EXCISION;  Surgeon: Schuyler Amor, MD;  Location: [place];  Service: Orthopedics;  Laterality: Right;   Past Medical History  Diagnosis Date  . Osteoporosis   . Varicosities     venous  . Cardiomyopathy HX --06/2010    EF was 25% during acute illness (PHELONEPHRITIS) Repeat echo 12-06-10 60% showed normal EF.   Marland Kitchen Hypertension   . Headache(784.0)   . PONV (postoperative nausea and vomiting)   . Heart murmur     DENIES S & S   (ECHO JUN'12 W/ CHART)  . History of chronic bronchitis   . Chronic back pain greater than 3 months duration     S/P BACK SURG'S  . Arachnoiditis BILATERAL LEGS    DUE TO MULTIPLE BACK SURG.'S  . Weakness of both legs DUE TO ARACHNOIDITIS    OCCASIONAL USES CANE  . Blood transfusion   . GERD (gastroesophageal reflux disease) AND HIATIAL HERNIA    CONTROLLED W/ NEXIUM  . Acute sinusitis, unspecified   . Anxiety state, unspecified   . Essential hypertension, benign   . Dyslipidemia   . Other malaise and fatigue   . Murmur, heart   . Shortness of breath   . Neuromuscular disorder     numbness and tingling  . Arthritis   . Hx of bladder infections   . Dysphagia     some post op cerv fusion 2/14  . Asthma    BP 137/82  Pulse 79  Resp 14  Ht 5\' 4"  (1.626 m)  Wt 181 lb (82.101 kg)  BMI 31.05 kg/m2  SpO2 99%  Opioid Risk  Score:   Fall Risk Score: High Fall Risk (>13 points) (pt educated on fall risk, brochure given to pt previously)    Review of Systems  Cardiovascular: Positive for leg swelling.  Musculoskeletal: Positive for back pain, gait problem and neck pain.  Neurological: Positive for tremors, weakness and numbness.       Tingling, spasms  All other systems reviewed and  are negative.      Objective:   Physical Exam  Nursing note and vitals reviewed. Constitutional: She is oriented to person, place, and time. She appears well-developed and well-nourished.  HENT:  Head: Normocephalic and atraumatic.  Neck: Normal range of motion. Neck supple.  Cervical Paraspinal Tenderness: C-3- C-5  Cardiovascular: Normal rate, regular rhythm and normal heart sounds.   Pulmonary/Chest: Effort normal and breath sounds normal.  Musculoskeletal:  Normal Muscle Bulk and Muscle Testing Reveals: Upper Extremities: Right arm Full ROM and Muscle Strength 3/5 Left Arm: Full ROM and Muscle Strength 5/5 AC Joint Tenderness Noted Spine Forward Flexion: 45 degrees/ With Forward Flexion she began to tremble in her right leg. Thoracic Paraspinal Tenderness Noted: T-5- T-8 Lumbar Paraspinal Tenderness Noted: L-3- L-5 Lower Extremities: Right Leg Flexion Produces Pain into her leg Left Leg Flexion Produces Pain in posteriorly in leg. Arises from Chair with ease. Uses 4 Prong Cane for support Antalgic Gait    Neurological: She is alert and oriented to person, place, and time.  Romberg Test: Positive She's able to perform Rapid alternating Movement.  Skin: Skin is warm and dry.  Psychiatric: She has a normal mood and affect.  Anxious when talking about her diagnosis and son. Tearful at times/ Emotional Support Given          Assessment & Plan:  1. 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  Refilled: Oxycodone 15mg  one tablet every 6 hours as needed #120 and  Opana 40 mg  one tablet every 12 hours  # 60 . 2. 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 Continue gabapentin 600mg  TID. Encourage Posture and Stretching Exercise.  3. Anxiety/depression: Declines antidepressant at this time.Encouraged Counseling. Continue to monitor.  30 minutes of face to face patient care time was spent during this visit. All questions were encouraged and answered.  F/U in 1 month

## 2013-12-18 ENCOUNTER — Encounter: Payer: Self-pay | Admitting: Registered Nurse

## 2013-12-18 ENCOUNTER — Encounter: Payer: Medicare Other | Attending: Physical Medicine & Rehabilitation | Admitting: Registered Nurse

## 2013-12-18 VITALS — BP 137/71 | HR 75 | Resp 14 | Ht 63.0 in | Wt 172.0 lb

## 2013-12-18 DIAGNOSIS — G039 Meningitis, unspecified: Secondary | ICD-10-CM

## 2013-12-18 DIAGNOSIS — M961 Postlaminectomy syndrome, not elsewhere classified: Secondary | ICD-10-CM | POA: Diagnosis not present

## 2013-12-18 DIAGNOSIS — M5412 Radiculopathy, cervical region: Secondary | ICD-10-CM | POA: Diagnosis not present

## 2013-12-18 MED ORDER — OXYMORPHONE HCL ER 40 MG PO TB12: 40.0000 mg | ORAL_TABLET | Freq: Two times a day (BID) | ORAL | Status: DC

## 2013-12-18 MED ORDER — GABAPENTIN 600 MG PO TABS: 600.0000 mg | ORAL_TABLET | Freq: Three times a day (TID) | ORAL | Status: DC

## 2013-12-18 MED ORDER — OXYCODONE HCL 15 MG PO TABS: 15.0000 mg | ORAL_TABLET | Freq: Four times a day (QID) | ORAL | Status: DC | PRN

## 2013-12-18 NOTE — Progress Notes (Signed)
Subjective:    Patient ID: [name] [name], female    DOB: [birth year], 63 y.o.   MRN: [id number]  HPI:[name] is a 63 year old female who returns for follow up for chronic pain and medication refill. She says her pain is located in her neck, back and bilateral legs. She rates her pain 9. Her current exercise therapy is walking and pool therapy daily when weather permits. She also uses heat therapy.  She was in the area hospital with her son who was diagnosed with a pineal cyst for a few days. She says he is home and will be getting another MRI in 6 months. Before surgery will be decided.  Pain Inventory Average Pain 7 Pain Right Now 9 My pain is sharp, burning, stabbing, tingling and aching  In the last 24 hours, has pain interfered with the following? General activity 7 Relation with others 6 Enjoyment of life 7 What TIME of day is your pain at its worst? constant all day Sleep (in general) Fair  Pain is worse with: walking, sitting, inactivity, standing and some activites Pain improves with: rest, heat/ice and medication Relief from Meds: 7  Mobility walk without assistance walk with assistance use a cane how many minutes can you walk? maybe 10 ability to climb steps?  yes do you drive?  yes transfers alone  Function not employed: date last employed na  Neuro/Psych weakness numbness tingling trouble walking spasms  Prior Studies Any changes since last visit?  no  Physicians involved in your care Any changes since last visit?  no   Family History  Problem Relation Age of Onset  . Hypertension Mother   . Heart disease Mother   . Diabetes Mother   . Cancer Father   . Diabetes Brother    History   Social History  . Marital Status: Married    Spouse Name: N/A    Number of Children: N/A  . Years of Education: N/A   Occupational History  . disable    Social History Main Topics  . Smoking status: Never Smoker   . Smokeless tobacco: Never  Used  . Alcohol Use: No  . Drug Use: No  . Sexual Activity: None     Comment: second hand smoke   Other Topics Concern  . None   Social History Narrative   Has one child   Disabled   Past Surgical History  Procedure Laterality Date  . Lumbar fusion  11-30-10    L4 - 5  . Exploration of incision for csf leak  DEC 2011  X2    POST LAMINECOTMY  . Lumbar laminectomy  DEC 2011    L4 - 5  . Lumbar fusion  2000    L2 - 4  . Cervical disc surgery  2005    C5 - 6  . Anterior fusion cervical spine  2007    C5 -6  . Tendon repair  JAN 2010    LEFT INDEX AND LONG FINGERS  . Left wrist tenosynectomy w/ left thumb joint repair  02-24-10  . Abdominal hysterectomy  1987    W/ BSO  . Cholecystectomy  1994  . Knee arthroscopy  LEFT X3 (LAST ONE 2005)  . Carpal tunnel release  RIGHT - 2001  & DEC 2011 W/ BACK SURG.  . Knee arthroscopy  05/17/2011    Procedure: ARTHROSCOPY KNEE;  Surgeon: Bradley Ferris III;  Location: [facility];  Service: Orthopedics;  Laterality:  Right;  WITH MEDIAL MENISECTOMY AND removal of suprapatella fat lump  . Anterior cervical decomp/discectomy fusion N/A 08/09/2012    Procedure: ANTERIOR CERVICAL DECOMPRESSION/DISCECTOMY FUSION 1 LEVEL;  Surgeon: Floyce Stakes, MD;  Location: [facility];  Service: Neurosurgery;  Laterality: N/A;  Cervical four-five  Anterior cervical decompression/diskectomy/fusion  . Finger arthrodesis Right 04/07/2013    Procedure: RIGHT INDEX AND RIGHT LONG DISTAL INTERPHALANGEAL JOINT FUSIONS;  Surgeon: Schuyler Amor, MD;  Location: [place];  Service: Orthopedics;  Laterality: Right;  . Finger arthroplasty Right 04/07/2013    Procedure: RIGHT THUMB [place] ARTHROPLASTY;  Surgeon: Schuyler Amor, MD;  Location: [place];  Service: Orthopedics;  Laterality: Right;  . Dorsal compartment release Right 04/07/2013    Procedure: RIGHT WRIST STS RELEASE;  Surgeon: Schuyler Amor, MD;   Location: [place];  Service: Orthopedics;  Laterality: Right;  . Ganglion cyst excision Right 04/07/2013    Procedure: RIGHT WRIST MASS EXCISION;  Surgeon: Schuyler Amor, MD;  Location: [facility];  Service: Orthopedics;  Laterality: Right;   Past Medical History  Diagnosis Date  . Osteoporosis   . Varicosities     venous  . Cardiomyopathy HX --06/2010    EF was 25% during acute illness (PHELONEPHRITIS) Repeat echo 12-06-10 60% showed normal EF.   Marland Kitchen Hypertension   . Headache(784.0)   . PONV (postoperative nausea and vomiting)   . Heart murmur     DENIES S & S   (ECHO JUN'12 W/ CHART)  . History of chronic bronchitis   . Chronic back pain greater than 3 months duration     S/P BACK SURG'S  . Arachnoiditis BILATERAL LEGS    DUE TO MULTIPLE BACK SURG.'S  . Weakness of both legs DUE TO ARACHNOIDITIS    OCCASIONAL USES CANE  . Blood transfusion   . GERD (gastroesophageal reflux disease) AND HIATIAL HERNIA    CONTROLLED W/ NEXIUM  . Acute sinusitis, unspecified   . Anxiety state, unspecified   . Essential hypertension, benign   . Dyslipidemia   . Other malaise and fatigue   . Murmur, heart   . Shortness of breath   . Neuromuscular disorder     numbness and tingling  . Arthritis   . Hx of bladder infections   . Dysphagia     some post op cerv fusion 2/14  . Asthma    BP 137/71  Pulse 75  Resp 14  Ht 5\' 3"  (1.6 m)  Wt 172 lb (78.019 kg)  BMI 30.48 kg/m2  SpO2 97%  Opioid Risk Score:   Fall Risk Score: High Fall Risk (>13 points) (pt eductaed on fall risk, brochure given to pt previously)   Review of Systems  Musculoskeletal: Positive for back pain, gait problem and neck pain.  Neurological: Positive for weakness and numbness.       Tingling, spasms  All other systems reviewed and are negative.      Objective:   Physical Exam  Nursing note and vitals reviewed. Constitutional: She is oriented to person, place, and time. She  appears well-developed and well-nourished.  HENT:  Head: Normocephalic and atraumatic.  Neck: Normal range of motion. Neck supple.  Cardiovascular: Normal rate, regular rhythm and normal heart sounds.   Pulmonary/Chest: Effort normal and breath sounds normal.  Musculoskeletal:  Normal Muscle Bulk and Muscle Testing Reveals: Upper Extremities: Full ROM and Muscle Strength on the Right 4/5 and left 3/5. Thoracic and  Lumbar Hypersensitivity Right Greater Trochanteric Tenderness Lower Extremities: Full ROM and Muscle Strength 5/5 Arises from chair with ease Narrow Based Gait  Neurological: She is alert and oriented to person, place, and time.  Skin: Skin is warm and dry.  Psychiatric: She has a normal mood and affect.          Assessment & Plan:  1. 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  Refilled: Oxycodone 15mg  one tablet every 6 hours as needed #120 and Opana 40 mg one tablet every 12 hours  # 60 .  2. 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 Continue gabapentin 600mg  TID. Encourage Posture and Stretching Exercise.  3. Anxiety/depression:  Continue Valium and monitor.   30 minutes of face to face patient care time was spent during this visit. All questions were encouraged and answered.  F/U in 1 month

## 2014-01-08 ENCOUNTER — Other Ambulatory Visit: Payer: Self-pay

## 2014-01-08 DIAGNOSIS — Z1231 Encounter for screening mammogram for malignant neoplasm of breast: Secondary | ICD-10-CM

## 2014-01-19 ENCOUNTER — Ambulatory Visit
Admission: RE | Admit: 2014-01-19 | Discharge: 2014-01-19 | Disposition: A | Discharge status: Home / Self Care | Payer: Medicare Other | Source: Ambulatory Visit

## 2014-01-19 ENCOUNTER — Encounter: Payer: Medicare Other | Attending: Physical Medicine & Rehabilitation | Admitting: Registered Nurse

## 2014-01-19 ENCOUNTER — Encounter: Payer: Self-pay | Admitting: Registered Nurse

## 2014-01-19 VITALS — BP 121/58 | HR 72 | Resp 14 | Ht 63.0 in | Wt 180.0 lb

## 2014-01-19 DIAGNOSIS — M961 Postlaminectomy syndrome, not elsewhere classified: Secondary | ICD-10-CM | POA: Diagnosis not present

## 2014-01-19 DIAGNOSIS — Z1231 Encounter for screening mammogram for malignant neoplasm of breast: Secondary | ICD-10-CM

## 2014-01-19 DIAGNOSIS — G039 Meningitis, unspecified: Secondary | ICD-10-CM | POA: Diagnosis not present

## 2014-01-19 DIAGNOSIS — Z79899 Other long term (current) drug therapy: Secondary | ICD-10-CM

## 2014-01-19 DIAGNOSIS — M5412 Radiculopathy, cervical region: Secondary | ICD-10-CM | POA: Insufficient documentation

## 2014-01-19 DIAGNOSIS — Z5181 Encounter for therapeutic drug level monitoring: Secondary | ICD-10-CM

## 2014-01-19 MED ORDER — OXYCODONE HCL 15 MG PO TABS: 15.0000 mg | ORAL_TABLET | Freq: Four times a day (QID) | ORAL | Status: DC | PRN

## 2014-01-19 MED ORDER — OXYMORPHONE HCL ER 40 MG PO TB12: 40.0000 mg | ORAL_TABLET | Freq: Two times a day (BID) | ORAL | Status: DC

## 2014-01-19 NOTE — Progress Notes (Signed)
Subjective:    Patient ID: MAHEEN CWIKLA, female    DOB: 07-14-1950, 63 y.o.   MRN: [id number]  HPI: [name] [name] is a 63 year old female who returns for follow up for chronic pain and medication refill. She says her pain is located in her neck, back and bilateral legs.Also says she has been having frequent headaches, she is under stress with her son and not sure if it's related to her allergies as well. Educated on relaxation techniques and she verbalized understanding.  She rates her pain 8. Her current exercise therapy is walking and pool therapy daily when weather permits. She also uses heat therapy.  She says she has been stumbling frequently in her home, she denies falling. Asking about a 4 prong cane. This has been ordered.  Pain Inventory Average Pain 8 Pain Right Now 8 My pain is sharp, burning, stabbing and tingling  In the last 24 hours, has pain interfered with the following? General activity 8 Relation with others 8 Enjoyment of life 8 What TIME of day is your pain at its worst? constant all day Sleep (in general) Fair  Pain is worse with: walking, sitting, standing and some activites Pain improves with: heat/ice, medication and TENS Relief from Meds: 7  Mobility walk with assistance use a cane how many minutes can you walk? 10 ability to climb steps?  yes do you drive?  yes transfers alone  Function not employed: date last employed n  Neuro/Psych weakness numbness tingling trouble walking spasms  Prior Studies Any changes since last visit?  no  Physicians involved in your care Any changes since last visit?  no   Family History  Problem Relation Age of Onset  . Hypertension Mother   . Heart disease Mother   . Diabetes Mother   . Cancer Father   . Diabetes Brother    History   Social History  . Marital Status: Married    Spouse Name: N/A    Number of Children: N/A  . Years of Education: N/A   Occupational History  . disable     Social History Main Topics  . Smoking status: Never Smoker   . Smokeless tobacco: Never Used  . Alcohol Use: No  . Drug Use: No  . Sexual Activity: None     Comment: second hand smoke   Other Topics Concern  . None   Social History Narrative   Has one child   Disabled   Past Surgical History  Procedure Laterality Date  . Lumbar fusion  11-30-10    L4 - 5  . Exploration of incision for csf leak  DEC 2011  X2    POST LAMINECOTMY  . Lumbar laminectomy  DEC 2011    L4 - 5  . Lumbar fusion  2000    L2 - 4  . Cervical disc surgery  2005    C5 - 6  . Anterior fusion cervical spine  2007    C5 -6  . Tendon repair  JAN 2010    LEFT INDEX AND LONG FINGERS  . Left wrist tenosynectomy w/ left thumb joint repair  02-24-10  . Abdominal hysterectomy  1987    W/ BSO  . Cholecystectomy  1994  . Knee arthroscopy  LEFT X3 (LAST ONE 2005)  . Carpal tunnel release  RIGHT - 2001  & DEC 2011 W/ BACK SURG.  . Knee arthroscopy  05/17/2011    Procedure: ARTHROSCOPY KNEE;  Surgeon: Karen Chafe  Rendall III;  Location: [facility];  Service: Orthopedics;  Laterality: Right;  WITH MEDIAL MENISECTOMY AND removal of suprapatella fat lump  . Anterior cervical decomp/discectomy fusion N/A 08/09/2012    Procedure: ANTERIOR CERVICAL DECOMPRESSION/DISCECTOMY FUSION 1 LEVEL;  Surgeon: Floyce Stakes, MD;  Location: [facility];  Service: Neurosurgery;  Laterality: N/A;  Cervical four-five  Anterior cervical decompression/diskectomy/fusion  . Finger arthrodesis Right 04/07/2013    Procedure: RIGHT INDEX AND RIGHT LONG DISTAL INTERPHALANGEAL JOINT FUSIONS;  Surgeon: Schuyler Amor, MD;  Location: [place];  Service: Orthopedics;  Laterality: Right;  . Finger arthroplasty Right 04/07/2013    Procedure: RIGHT THUMB [place] ARTHROPLASTY;  Surgeon: Schuyler Amor, MD;  Location: [place];  Service: Orthopedics;  Laterality: Right;  . Dorsal compartment release  Right 04/07/2013    Procedure: RIGHT WRIST STS RELEASE;  Surgeon: Schuyler Amor, MD;  Location: [facility];  Service: Orthopedics;  Laterality: Right;  . Ganglion cyst excision Right 04/07/2013    Procedure: RIGHT WRIST MASS EXCISION;  Surgeon: Schuyler Amor, MD;  Location: [place];  Service: Orthopedics;  Laterality: Right;   Past Medical History  Diagnosis Date  . Osteoporosis   . Varicosities     venous  . Cardiomyopathy HX --06/2010    EF was 25% during acute illness (PHELONEPHRITIS) Repeat echo 12-06-10 60% showed normal EF.   Marland Kitchen Hypertension   . Headache(784.0)   . PONV (postoperative nausea and vomiting)   . Heart murmur     DENIES S & S   (ECHO JUN'12 W/ CHART)  . History of chronic bronchitis   . Chronic back pain greater than 3 months duration     S/P BACK SURG'S  . Arachnoiditis BILATERAL LEGS    DUE TO MULTIPLE BACK SURG.'S  . Weakness of both legs DUE TO ARACHNOIDITIS    OCCASIONAL USES CANE  . Blood transfusion   . GERD (gastroesophageal reflux disease) AND HIATIAL HERNIA    CONTROLLED W/ NEXIUM  . Acute sinusitis, unspecified   . Anxiety state, unspecified   . Essential hypertension, benign   . Dyslipidemia   . Other malaise and fatigue   . Murmur, heart   . Shortness of breath   . Neuromuscular disorder     numbness and tingling  . Arthritis   . Hx of bladder infections   . Dysphagia     some post op cerv fusion 2/14  . Asthma    BP 121/58  Pulse 72  Resp 14  Ht 5\' 3"  (1.6 m)  Wt 180 lb (81.647 kg)  BMI 31.89 kg/m2  SpO2 96%  Opioid Risk Score:   Fall Risk Score: Moderate Fall Risk (6-13 points) (pt educated on fall risk, brochure given to pt previously)    Review of Systems  Musculoskeletal: Positive for gait problem.  Neurological: Positive for weakness and numbness.       Tingling, spasms  All other systems reviewed and are negative.      Objective:   Physical Exam  Nursing note and vitals  reviewed. Constitutional: She is oriented to person, place, and time. She appears well-developed and well-nourished.  HENT:  Head: Normocephalic and atraumatic.  Neck: Normal range of motion. Neck supple.  Cardiovascular: Normal rate, regular rhythm and normal heart sounds.   Pulmonary/Chest: Effort normal and breath sounds normal.  Musculoskeletal:  Normal Muscle Bulk and Muscle testing Reveals: Upper Extremities: Full ROM and Muscle strength  on The Right 4/5 and Left 3/4 Spinal Forward Flexion 30 Degrees and extension 10 Degrees Lower Extremities: Full ROM and Muscle strength 5/5 Right Leg Flexion Produces Pain into Lower Back Left Leg Flexion Produces Pain into Hamstring Arises from Chair with ease Using Straight Cane for Support Narrow based gait  Neurological: She is alert and oriented to person, place, and time.  Skin: Skin is warm and dry.  Psychiatric: She has a normal mood and affect.          Assessment & Plan:  1. 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  Refilled: Oxycodone 15mg  one tablet every 6 hours as needed #120 and Opana 40 mg one tablet every 12 hours  # 60 .  2. 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 Continue gabapentin 600mg  TID. Encourage Posture and Stretching Exercise. RX: [employer] 3. Anxiety/depression: Continue Valium and monitor.   20 minutes of face to face patient care time was spent during this visit. All questions were encouraged and answered.   F/U in 1 month

## 2014-01-28 ENCOUNTER — Telehealth: Payer: Self-pay | Admitting: *Deleted

## 2014-01-28 NOTE — Telephone Encounter (Signed)
Calling to see if she can cancel appt in September and reschedule for October picking up her rx.  Her niece is having baby and has no one to help her and [name] [name] would like to go and help her in the postpartum period. She can call for her  Rx and it can be picked up can be picked up.

## 2014-02-10 DIAGNOSIS — I10 Essential (primary) hypertension: Secondary | ICD-10-CM | POA: Diagnosis not present

## 2014-02-10 DIAGNOSIS — J209 Acute bronchitis, unspecified: Secondary | ICD-10-CM | POA: Diagnosis not present

## 2014-02-11 ENCOUNTER — Telehealth: Payer: Self-pay

## 2014-02-11 DIAGNOSIS — M5412 Radiculopathy, cervical region: Secondary | ICD-10-CM

## 2014-02-11 DIAGNOSIS — M961 Postlaminectomy syndrome, not elsewhere classified: Secondary | ICD-10-CM

## 2014-02-11 DIAGNOSIS — G039 Meningitis, unspecified: Secondary | ICD-10-CM

## 2014-02-11 MED ORDER — OXYMORPHONE HCL ER 40 MG PO TB12: 40.0000 mg | ORAL_TABLET | Freq: Two times a day (BID) | ORAL | Status: DC

## 2014-02-11 MED ORDER — OXYCODONE HCL 15 MG PO TABS: 15.0000 mg | ORAL_TABLET | Freq: Four times a day (QID) | ORAL | Status: DC | PRN

## 2014-02-11 NOTE — Telephone Encounter (Signed)
Patient is requesting a refill on Opana and Oxycodone. Patient husband(Jackie Osterberg) or Son(Christopher Jimmye Norman) will pick up the RX on 9/1. RX printed to be signed.

## 2014-02-12 ENCOUNTER — Other Ambulatory Visit: Payer: Self-pay | Admitting: Internal Medicine

## 2014-02-12 DIAGNOSIS — M810 Age-related osteoporosis without current pathological fracture: Secondary | ICD-10-CM

## 2014-02-17 ENCOUNTER — Ambulatory Visit: Payer: Medicare Other | Admitting: Registered Nurse

## 2014-02-18 IMAGING — RF DG MYELOGRAM 2+ REGIONS
12 of 24 series · 12 of 24 positions shown · IV contrast (omnipaque)
Comparison: 02/26/2011.  08/31/2010.  05/27/2010.

CLINICAL DATA: PREVIOUS LUMBAR AND CERVICAL FUSIONS.  LOW BACK
PAIN AND BILATERAL LEG PAIN WORSE ON THE RIGHT.  BILATERAL ARM PAIN
AND NUMBNESS.

 MYELOGRAM INJECTION
TECHNIQUE: Informed consent was obtained from the patient prior to
the procedure, including potential complications of headache,
allergy, infection and pain.  A timeout procedure was performed.
With the patient prone, the lower back was prepped with Betadine.
1% Lidocaine was used for local anesthesia.  Lumbar puncture was
performed at the right L5-S1 level using a 22 gauge needle with
return of the clear CSF.  Nine ml of Omnipaque 911was injected into
the subarachnoid space .
TECHNIQUE: Following injection of intrathecal Omnipaque contrast,
spine imaging in multiple projections was performed using
fluoroscopy.
Fluoroscopy Time: 1 minute 48 seconds .
TECHNIQUE: CT imaging of the cervical spine was performed after
intrathecal contrast administration. Multiplanar CT image
reconstructions were also generated.
TECHNIQUE: CT imaging of the lumbar spine was performed after
intrathecal contrast administration.  Multiplanar CT image

[Series 2: myelogram  white · 1 of 1 slices shown (1 of 10)]
[im 1/1]
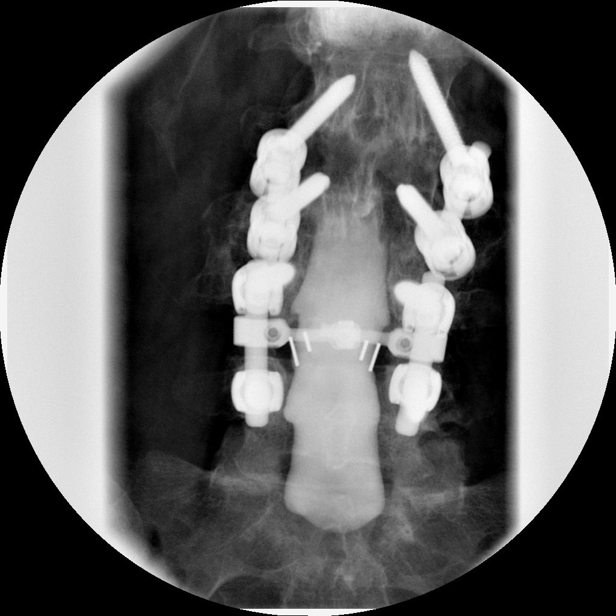

[Series 4: myelogram  white · 1 of 1 slices shown (2 of 10)]
[im 1/1]
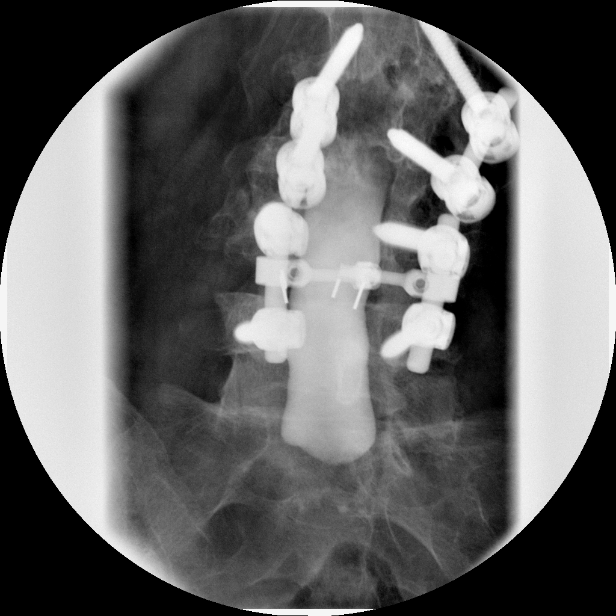

[Series 6: myelogram  white · 1 of 1 slices shown (3 of 10)]
[im 1/1]
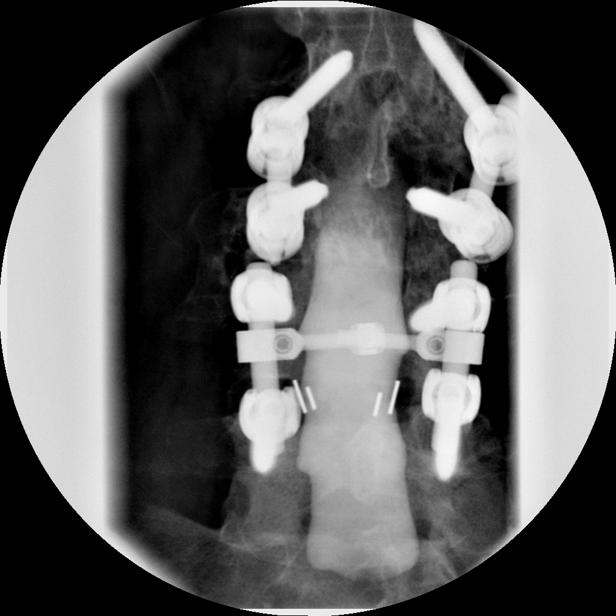

[Series 8: myelogram  white · 1 of 1 slices shown (4 of 10)]
[im 1/1]
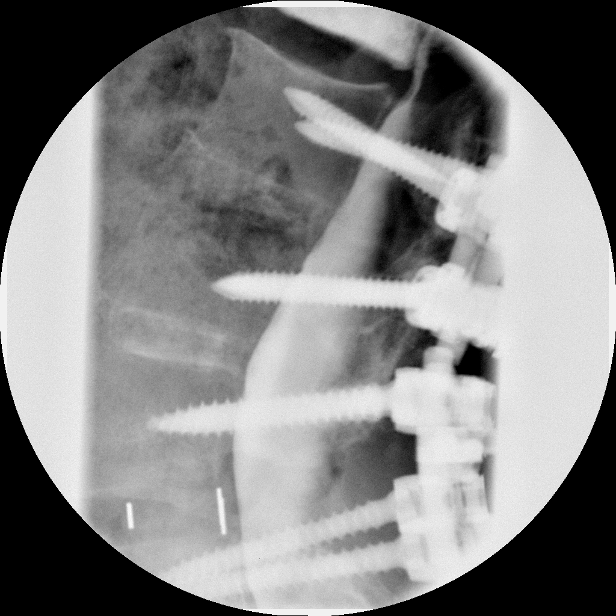

[Series 10: myelogram  white · 1 of 1 slices shown (5 of 10)]
[im 1/1]
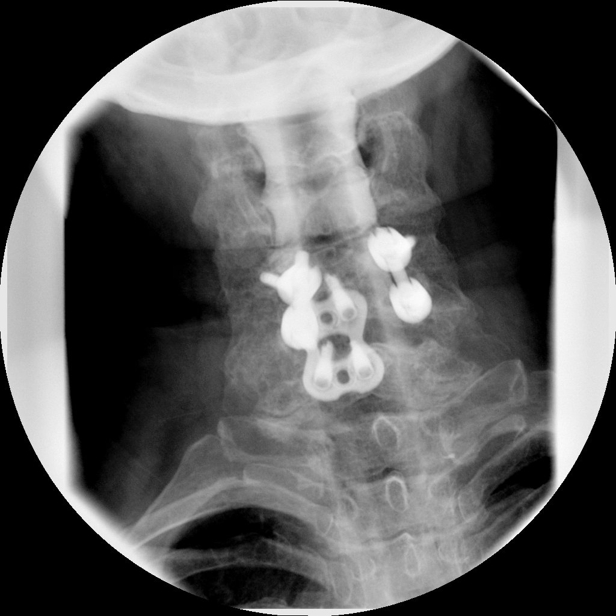

[Series 12: myelogram  white · 1 of 1 slices shown (6 of 10)]
[im 1/1]
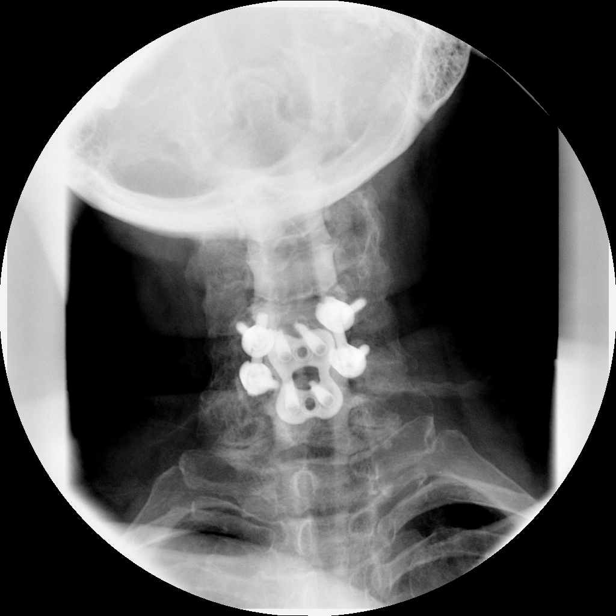

[Series 14: myelogram  white · 1 of 1 slices shown (7 of 10)]
[im 1/1]
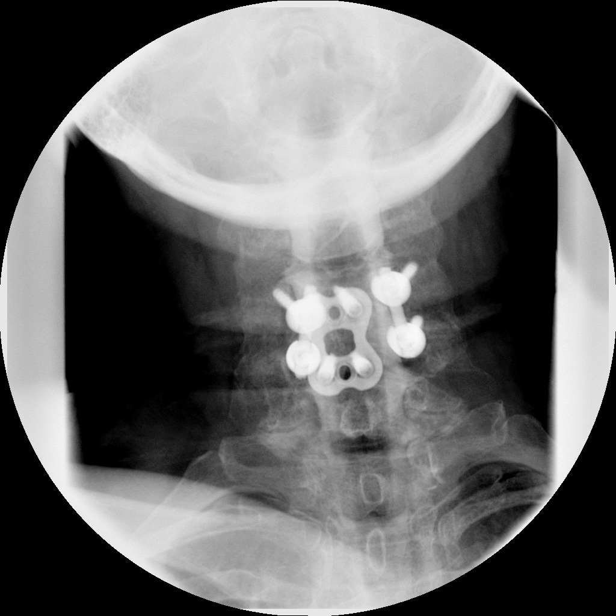

[Series 16: myelogram  white · 1 of 1 slices shown (8 of 10)]
[im 1/1]
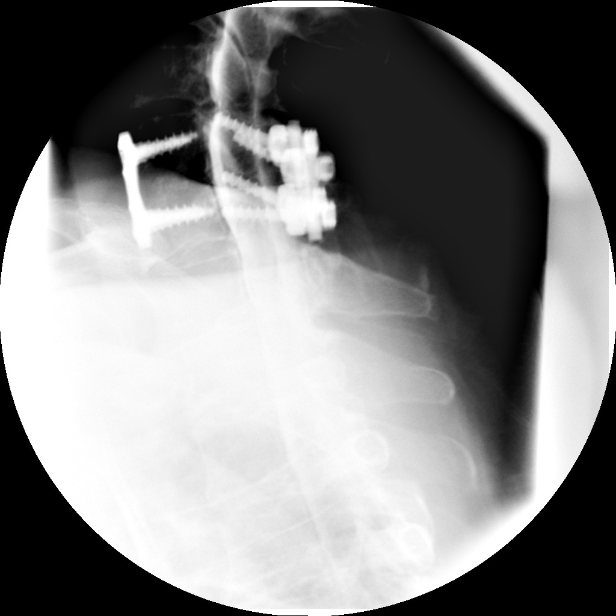

[Series 18: myelogram  white · 1 of 1 slices shown (9 of 10)]
[im 1/1]
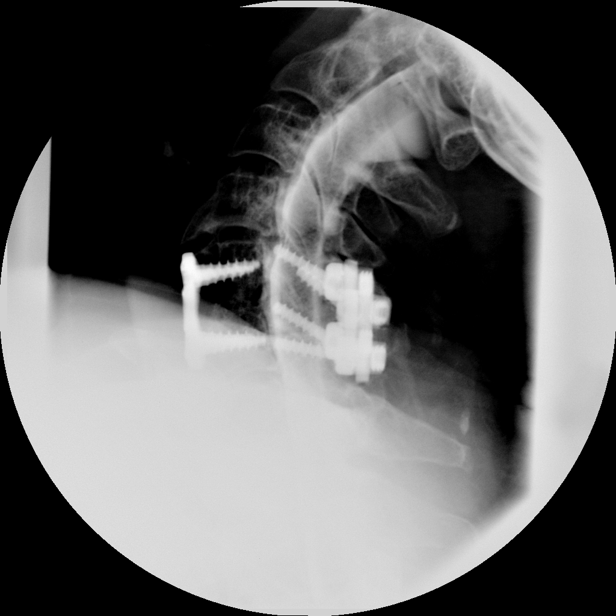

[Series 20: myelogram  white · 1 of 1 slices shown (10 of 10)]
[im 1/1]
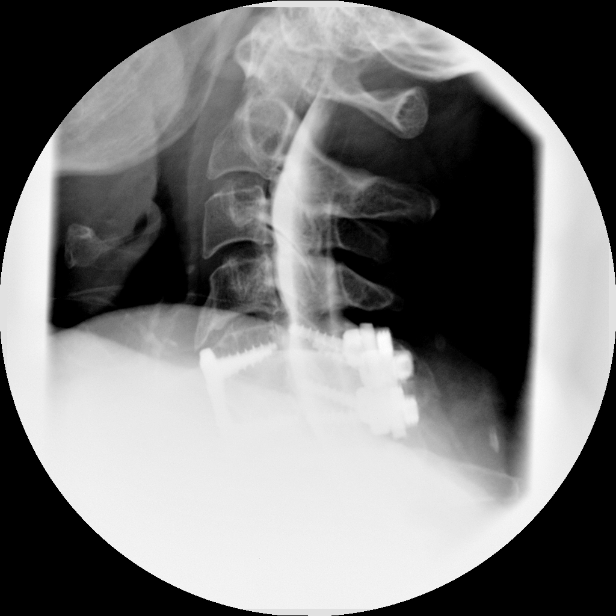

[Series 1001: view not recorded · 0.15mm/px · 1 of 1 slices shown (1 of 2)]
[im 1/1]
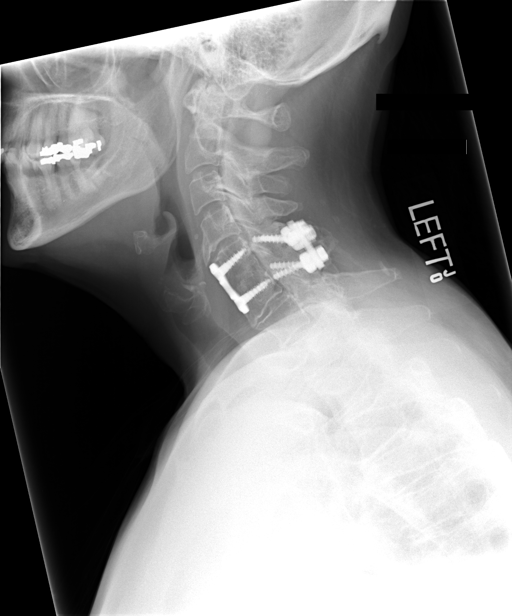

[Series 1003: view not recorded · 0.15mm/px · 1 of 1 slices shown (2 of 2)]
[im 1/1]
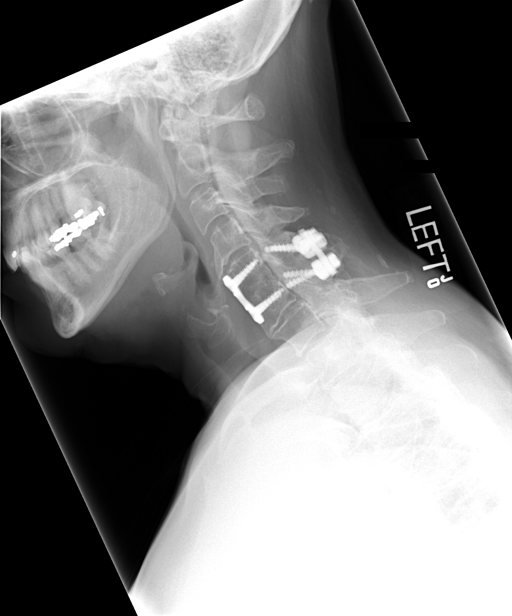

[12 of 24 positions shown; findings below may reference images not displayed]

IMPRESSION: Successful injection of  intrathecal contrast for myelography.

MYELOGRAM CERVICAL AND LUMBAR
FINDINGS: In the lumbar region, there is an absence of nerve root
shadows indicating arachnoiditis.

There has been previous decompression and fusion from L2-L5.  The
spinal canal is widely patent.  There is no stenosis.  Rods are not
continuous between the L3 and L4.

There is been previous vertebral augmentation at the L1.  This has
a good appearance.  There is a small antral extradural defect at L1-
2 but no apparent compressive stenosis.

In the cervical region, there has been anterior and posterior
fusion at C5-6.  There is a small anterior extradural defect at C4-
5 and at C6-7.  No abnormality of root sleeve filling is
appreciated.

Cervical flexion and extension views do not show any motion at the
C5-6 level or any abnormal motion above or below that.  Lumbar
flexion/extension views do not show any motion in the fusion
segment from L2-L5.  No abnormal motion above or below that.
IMPRESSION: A pattern of severe arachnoiditis in the lumbar region.

Good appearance in the fusion segment from L2-L5.  No stenosis.

Good appearance at the cervical fusion level of C5-6.  Adjacent
segment degenerative disease at C4-5 without evidence of neural
compression at myelography.

CT MYELOGRAPHY CERVICAL SPINE
FINDINGS: The foramen magnum is widely patent.  There is ordinary
mild degenerative change between the anterior arch of C1 and the
dens.  No encroachment upon the neural spaces.

C2-3:  Mild facet degeneration without slippage.  Wide patency of
the canal and foramina.

C3-4:  Mild facet degeneration on the right and moderate facet
degeneration on the left.  Mild bulging of the disc.  No canal
stenosis.  There is mild osteophytic encroachment upon the foramen
on the left.

C4-5:  Endplate osteophytes and bulging of the disc.  Slight
indentation of the ventral subarachnoid space but no compressive
effect upon the cord.  Mild foraminal narrowing due to
uncovertebral prominence.

C5-6:  Previous anterior and posterior fusion which is solid with
wide patency of the canal and foramina.  The left sided posterior
screw at C6 does extend into the left side of the spinal canal as
seen previously.

C6-7:  Wide patency of the canal and foramina.  Posterior bony
fusion is present.

C7-T1:  Facet degeneration bilaterally with 1 mm of
anterolisthesis.  No disc pathology.  The central canal is widely
patent.  Mild foraminal encroachment by osteophytes bilaterally.
IMPRESSION: Left worse than right facet degeneration at C3-4.  Mild foraminal
narrowing on the left because of osteophytic encroachment.

Spondylosis at C4-5 with endplate osteophytes and bulging of the
disc.  Narrowing of the ventral subarachnoid space no compression
of the cord.  Mild foraminal narrowing bilaterally due to
uncovertebral osteophytes.

Solid fusion at C5-6.  Left-sided posterior elements screw at C6
again seen to extend into the left side of the canal.

Posterior fusion at C6-7.

Facet arthropathy at C7-T1 with mild osteophytic encroachment upon
the foramina bilaterally.

CT MYELOGRAPHY LUMBAR SPINE
FINDINGS: There is a pattern of profound arachnoiditis.  Nerve
roots of the cauda equina are markedly clumped and are adhesed to
the peripheral thecal sac distally.

T12-L1:  Mild bulging of the disc.  Posterior superior aspect of
the L1 vertebral body is bowed slightly posteriorly because of an
old L1 fracture there is been treated with vertebral augmentation.
No neural compression.  The augmentation has a good appearance.

L1-2:  Bulging of the disc more towards the right.  Facet
degeneration without slippage.  No compressive stenosis.

L2-L5:  Solid fusion from L2-L4.  Pedicle screws well positioned
without evidence of motion.  At L4-5, interbody fusion material is
present.  There is probably solid fusion at this level as well.  No
evidence of screw motion.  No stenosis.

L5-S1:  Mild facet degeneration without slippage or stenosis.  Mild
bulging of the disc.  No apparent neural compression.  He
IMPRESSION: The dominant finding in this case is that of severe arachnoiditis.

Solid fusion from L2-L5.  Good patency of the canal and foramina.

Old L1 fracture, previously augmented.  No neural compression
related to that.

Non compressive disc bulges at L1-2 and L5-S1.

## 2014-03-13 ENCOUNTER — Encounter: Payer: Self-pay | Admitting: Internal Medicine

## 2014-03-13 ENCOUNTER — Ambulatory Visit
Admission: RE | Admit: 2014-03-13 | Discharge: 2014-03-13 | Disposition: A | Discharge status: Home / Self Care | Payer: Medicare Other | Source: Ambulatory Visit | Attending: Internal Medicine | Admitting: Internal Medicine

## 2014-03-13 ENCOUNTER — Ambulatory Visit (INDEPENDENT_AMBULATORY_CARE_PROVIDER_SITE_OTHER): Payer: Medicare Other | Admitting: Internal Medicine

## 2014-03-13 ENCOUNTER — Other Ambulatory Visit: Payer: Self-pay | Admitting: Internal Medicine

## 2014-03-13 ENCOUNTER — Telehealth: Payer: Self-pay | Admitting: Internal Medicine

## 2014-03-13 VITALS — BP 126/80 | HR 71 | Ht 63.0 in | Wt 183.0 lb

## 2014-03-13 DIAGNOSIS — J45901 Unspecified asthma with (acute) exacerbation: Secondary | ICD-10-CM | POA: Diagnosis not present

## 2014-03-13 DIAGNOSIS — J4531 Mild persistent asthma with (acute) exacerbation: Secondary | ICD-10-CM

## 2014-03-13 DIAGNOSIS — Z780 Asymptomatic menopausal state: Secondary | ICD-10-CM | POA: Diagnosis not present

## 2014-03-13 DIAGNOSIS — M8580 Other specified disorders of bone density and structure, unspecified site: Secondary | ICD-10-CM

## 2014-03-13 DIAGNOSIS — M810 Age-related osteoporosis without current pathological fracture: Secondary | ICD-10-CM

## 2014-03-13 DIAGNOSIS — M899 Disorder of bone, unspecified: Secondary | ICD-10-CM | POA: Diagnosis not present

## 2014-03-13 DIAGNOSIS — M949 Disorder of cartilage, unspecified: Secondary | ICD-10-CM | POA: Diagnosis not present

## 2014-03-13 MED ORDER — PREDNISONE 10 MG PO TABS: ORAL_TABLET | ORAL | Status: DC

## 2014-03-13 NOTE — Addendum Note (Signed)
Addended by: Maurice March on: 03/13/2014 11:49 AM   Modules accepted: Orders

## 2014-03-13 NOTE — Patient Instructions (Addendum)
#  lung nodule  -= small 47mm and stable Jan 2014 - Jan 2015  - given your concern for lung cancer, next CT chest wo contrast June 2016  #Asthma   - recent exacerbation might not be fully out of woods yet - Please take prednisone 40 mg x1 day, then 30 mg x1 day, then 20 mg x1 day, then 10 mg x1 day, and then 5 mg x1 day and stop  - continue symbicort as before  - change albuterol to xopenex mdi - get flu shot next week    #FOllowup 4 weeks with NP [name] [name] at followup in 4 weeks with NP [name]

## 2014-03-13 NOTE — Telephone Encounter (Signed)
Called and spoke with the pt and she stated that the pharmacy called her and they did have her rx.  This has been filled and she has already picked this up.  Nothing further is needed.

## 2014-03-13 NOTE — Progress Notes (Signed)
Subjective:    Patient ID: [name] [name], female    DOB: 05/06/1951, 63 y.o.   MRN: [id number]       HPI  PCP is HASANAJ,XAJE A, MD Cards is dr Vita Barley  Body mass index is 26.83 kg/(m^2). Passive smoker x 17 years   IOV 04/22/2012 63 year old female. Referred for dyspnea. Wondering if she has passive smoking related lung disease (husband and son)  Dyspnea: In 2011 December was critically ill following spine surgery, had UTI and low EF but since then has recovered from critical illness. Says she has been dyspneic even before this critical illness for total 3-4 years but since past 1 year more noticeable dyspnea. Sometimes dyspneic walking from room to room. Sometimes dyspneic talking on phone. Triggers include cig smoke  Of husband ("husband's cig smoke is killing me and they think I am crazy") and is associated with cough at that time. Dyspnea is slowly progressive. Severity is mild-moderate. No orthopnea. No paroxysmal nocturnal dyspnea but has woken up wheezing at night since sept 2013 (was exposed to perfume at doctor office and dx acute bronchitis and given abx). THere is associated cough but only episodic.  There is no edema, hemoptysis, unintenional weight loss.   Walking desaturation test on 04/23/2012 185 feet x 3 laps:  did NOT desaturate. Rest pulse ox was 97%, final pulse ox was 94%. HR response 84/min at rest to 86/min at peak exertion.   RElevant hx   Fused L2-L5 x 3 spine surgery due to disc disease, osteoporosis, and also fall, various reasons  LABS - cxr sept 2012: clear lung fields - hgb nov 2012: 11.7gm%  - cardiac 04/01/12: Normal stress test with normal EF - no hx of PFTs  REC #Shortness of breath  - our check list says latex allergy is cautionary for flu shot; do not know why but we will defer flu shot today  - CMA/RN will walk you for oxygen levels now 04/22/2012  - please have full PFT breathing test at [place] or [place] or  at your  convenience; soon after doing it call us at [phone] and pass message on so I can review and advise next step      Telephone calls Dec - FEb 2014 PFT 05/27/12 is normal ecwpt mild reduction in diffusion (74%) and 8% BD response. These could be freatures of early emphysema, asthma, or scarring in lungs or normal variant. To better understand this - do HRCT chest. IF this is normal, needs methacholine challenge test for asthma and if that too is normal needs bike pulmonary stress test. She is aware of this algorithm. I will stay in touch by phone. For now, get CT chest (ordered)  CT chest does not show reason for shortness of breath. Next to do is methacholine challenge test; ordered. She is do and give [place] a call so I can review it. She know the algorithm. CT does show 80mm lung nodules -very very very rare < 1:1000 chance it is serious like cancer. She needs repeat ct chest in 1 year without cotnrast (ordered)  Methacholine challenge test on 08/07/2012 is positive for asthma. PD 20 at less than 4.0. No flow volume loop issues   OV 09/24/2012  Followup 3 mm lung nodule and Followup for dyspnea on the basis of positive methacholine challenge test   At the end of last visit she had diagnostic workup. She has been diagnosed with 3 mm lung nodule and also positive methacholine  challenge test. For the past few weeks she is taking Symbicort 2 puff twice daily. This is helping with her symptoms. She is absolutely petrified about developing lung cancer. She is upset that her husband and son continued to smoke and had a house and this is an etiological agent for lung cancer. She is also worried that this will make her asthma worse. I have agreed with her sentiments. There are no new issues   Past, Family, Social reviewed: no change since last visit other than the fact she had disc fusion surgery by Dr. Joya Salm   OV 07/01/2013  Chief Complaint  Patient presents with  . Follow-up    to review CT results. Pt  states she has been using xopenex at least once daily and also has albuterol neb that she is using 2-3 times a week.     FU  - asthma ( reports that she has never smoked. She has never used smokeless tobacco. But PASSIVE SMOKER +)  - lung nodule   In terms of asthma  - Noiv 2014 had AE-asthma due to virus. Rx by PCP. She is upset that hsuband and son do not listen to her and contiue to expose her to smoke and places hjer at risk for Ae-asthma. I have offered to counsel them at a future visit. She is out of symbicort but is compliant. She prefers xopenex mdi and can afford it. Wants scripts for both. Uptodate with flu shot  In terms of nodule  - stable 3 mm nodules at base Jan 2014 through Jan 2015. She is worried about cancer risk due to passive smoking and prefers followup Scans.     OV 03/13/2014  Chief Complaint  Patient presents with  . Follow-up    Pt states the summer months were causing her difficulty breathing. Pt states she has cough in evening and at night with intermittent mucus production, clear in color. Pt c/o DOE and chest tightness with activity.     FU asthma (on basis of positive methacholine challenge test)   - Says this summer 2015 has been a bit roiugh for her due to humidity; lot of down days with chest tightness and wheezing and dyspnea but never any ae-asthma till 2 weeks ago and then given Clarithromycin per hx and 7d prednisone. After this 75% better but still feels not at baseline. Just feels tired and some wheeze and some chest tightness. Currently no fever or chills;.Spuutum is small volume and white  - In addition a month ago when well some family members noted that she was blue on lips and fingers but she was asymptomatic then. Today walk test 185 feet x 3 laps: did not desaturate but did have dyspnea   Review of Systems  Constitutional: Negative for fever and unexpected weight change.  HENT: Positive for congestion. Negative for dental problem, ear  pain, nosebleeds, postnasal drip, rhinorrhea, sinus pressure, sneezing, sore throat and trouble swallowing.   Eyes: Negative for redness and itching.  Respiratory: Positive for cough, chest tightness and shortness of breath. Negative for wheezing.   Cardiovascular: Negative for palpitations and leg swelling.  Gastrointestinal: Negative for nausea and vomiting.  Genitourinary: Negative for dysuria.  Musculoskeletal: Negative for joint swelling.  Skin: Negative for rash.  Neurological: Negative for headaches.  Hematological: Does not bruise/bleed easily.  Psychiatric/Behavioral: Negative for dysphoric mood. The patient is not nervous/anxious.        Objective:   Physical Exam  Vitals reviewed. Constitutional: She is oriented to  person, place, and time. She appears well-developed and well-nourished. No distress.  Body mass index is 32.43 kg/(m^2).  ----------------------------               03/13/14                      1111        ----------------------------  BP:           126/80        Pulse:          71          Height:   5\' 3"  (1.6 m)     Weight: 183 lb (83.008 kg)  SpO2:          97%         ----------------------------   HENT:  Head: Normocephalic and atraumatic.  Right Ear: External ear normal.  Left Ear: External ear normal.  Mouth/Throat: Oropharynx is clear and moist. No oropharyngeal exudate.  Mild post nasal drip - does not like taking nasal steroids  Eyes: Conjunctivae and EOM are normal. Pupils are equal, round, and reactive to light. Right eye exhibits no discharge. Left eye exhibits no discharge. No scleral icterus.  Neck: Normal range of motion. Neck supple. No JVD present. No tracheal deviation present. No thyromegaly present.  Cardiovascular: Normal rate, regular rhythm, normal heart sounds and intact distal pulses.  Exam reveals no gallop and no friction rub.   No murmur heard. Pulmonary/Chest: Effort normal and breath sounds  normal. No respiratory distress. She has no wheezes. She has no rales. She exhibits no tenderness.  Coarse breath sounds  Abdominal: Soft. Bowel sounds are normal. She exhibits no distension and no mass. There is no tenderness. There is no rebound and no guarding.  Musculoskeletal: Normal range of motion. She exhibits no edema and no tenderness.  Lymphadenopathy:    She has no cervical adenopathy.  Neurological: She is alert and oriented to person, place, and time. She has normal reflexes. No cranial nerve deficit. She exhibits normal muscle tone. Coordination normal.  Skin: Skin is warm and dry. No rash noted. She is not diaphoretic. No erythema. No pallor.  Psychiatric: She has a normal mood and affect. Her behavior is normal. Judgment and thought content normal.     Filed Vitals:   03/13/14 1111  BP: 126/80  Pulse: 71  Height: 5\' 3"  (1.6 m)  Weight: 183 lb (83.008 kg)  SpO2: 97%        Assessment & Plan:  #lung nodule  -= small 65mm and stable Jan 2014 - Jan 2015  - given your concern for lung cancer, next CT chest wo contrast June 2016  #Asthma   - recent exacerbation might not be fully out of woods yet - Please take prednisone 40 mg x1 day, then 30 mg x1 day, then 20 mg x1 day, then 10 mg x1 day, and then 5 mg x1 day and stop  - continue symbicort as before  - change albuterol to xopenex mdi - get flu shot next week    #FOllowup 4 weeks with NP Tammy Spirometry at followup in 4 weeks with NP Tammy

## 2014-03-19 ENCOUNTER — Encounter: Payer: Self-pay | Admitting: Registered Nurse

## 2014-03-19 ENCOUNTER — Ambulatory Visit (HOSPITAL_COMMUNITY)
Admission: RE | Admit: 2014-03-19 | Discharge: 2014-03-19 | Disposition: A | Discharge status: Home / Self Care | Payer: Medicare Other | Source: Ambulatory Visit | Attending: Registered Nurse | Admitting: Registered Nurse

## 2014-03-19 ENCOUNTER — Encounter: Payer: Medicare Other | Attending: Physical Medicine & Rehabilitation | Admitting: Registered Nurse

## 2014-03-19 VITALS — BP 159/76 | HR 70 | Resp 14 | Ht 65.73 in | Wt 188.6 lb

## 2014-03-19 DIAGNOSIS — Z23 Encounter for immunization: Secondary | ICD-10-CM | POA: Diagnosis not present

## 2014-03-19 DIAGNOSIS — M5441 Lumbago with sciatica, right side: Secondary | ICD-10-CM | POA: Insufficient documentation

## 2014-03-19 DIAGNOSIS — Z79899 Other long term (current) drug therapy: Secondary | ICD-10-CM | POA: Diagnosis not present

## 2014-03-19 DIAGNOSIS — Z5181 Encounter for therapeutic drug level monitoring: Secondary | ICD-10-CM | POA: Insufficient documentation

## 2014-03-19 DIAGNOSIS — G039 Meningitis, unspecified: Secondary | ICD-10-CM | POA: Diagnosis not present

## 2014-03-19 DIAGNOSIS — M546 Pain in thoracic spine: Secondary | ICD-10-CM | POA: Diagnosis not present

## 2014-03-19 DIAGNOSIS — M961 Postlaminectomy syndrome, not elsewhere classified: Secondary | ICD-10-CM | POA: Diagnosis not present

## 2014-03-19 DIAGNOSIS — M545 Low back pain: Secondary | ICD-10-CM | POA: Diagnosis not present

## 2014-03-19 MED ORDER — OXYCODONE HCL 15 MG PO TABS: 15.0000 mg | ORAL_TABLET | Freq: Four times a day (QID) | ORAL | Status: DC | PRN

## 2014-03-19 MED ORDER — OXYMORPHONE HCL ER 40 MG PO TB12: 40.0000 mg | ORAL_TABLET | Freq: Two times a day (BID) | ORAL | Status: DC

## 2014-03-19 NOTE — Progress Notes (Signed)
Subjective:    Patient ID: [name] [name], female    DOB: [birth year], 63 y.o.   MRN: [id number]  HPI: [name] is a 63 year old female who returns for follow up for chronic pain and medication refill. She says her pain is located in her mid-lower back radiating into her right leg.  She rates her pain 9. Her current exercise regime is walking.  On August 30th she was walking from her living room into the kitchen when her legs gave out. She braced herself against the wall, she states:"she felt her spine move forward". She didn't seek medical attention. She didn't call Dr. Joya Salm.  In the last 4-5 days she has been experiencing excruciating pain. We will obtain X-rays today. She verbalizes understanding.  Pain Inventory Average Pain 7 Pain Right Now 9 My pain is constant, sharp, stabbing, tingling and aching  In the last 24 hours, has pain interfered with the following? General activity 9 Relation with others 10 Enjoyment of life 9 What TIME of day is your pain at its worst? All Sleep (in general) Fair  Pain is worse with: walking, sitting, standing and some activites Pain improves with:Heat and medications Relief from Meds: 7  Mobility use a cane  Function disabled: date disabled .  Neuro/Psych weakness numbness trouble walking depression anxiety  Prior Studies Any changes since last visit?  no  Physicians involved in your care Any changes since last visit?  no   Family History  Problem Relation Age of Onset  . Hypertension Mother   . Heart disease Mother   . Diabetes Mother   . Cancer Father   . Diabetes Brother    History   Social History  . Marital Status: Married    Spouse Name: N/A    Number of Children: N/A  . Years of Education: N/A   Occupational History  . disable    Social History Main Topics  . Smoking status: Never Smoker   . Smokeless tobacco: Never Used  . Alcohol Use: No  . Drug Use: No  . Sexual Activity: Not on file   Comment: second hand smoke   Other Topics Concern  . Not on file   Social History Narrative   Has one child   Disabled   Past Surgical History  Procedure Laterality Date  . Lumbar fusion  11-30-10    L4 - 5  . Exploration of incision for csf leak  DEC 2011  X2    POST LAMINECOTMY  . Lumbar laminectomy  DEC 2011    L4 - 5  . Lumbar fusion  2000    L2 - 4  . Cervical disc surgery  2005    C5 - 6  . Anterior fusion cervical spine  2007    C5 -6  . Tendon repair  JAN 2010    LEFT INDEX AND LONG FINGERS  . Left wrist tenosynectomy w/ left thumb joint repair  02-24-10  . Abdominal hysterectomy  1987    W/ BSO  . Cholecystectomy  1994  . Knee arthroscopy  LEFT X3 (LAST ONE 2005)  . Carpal tunnel release  RIGHT - 2001  & DEC 2011 W/ BACK SURG.  . Knee arthroscopy  05/17/2011    Procedure: ARTHROSCOPY KNEE;  Surgeon: Bradley Ferris III;  Location: [facility];  Service: Orthopedics;  Laterality: Right;  WITH MEDIAL MENISECTOMY AND removal of suprapatella fat lump  . Anterior cervical decomp/discectomy fusion N/A 08/09/2012  Procedure: ANTERIOR CERVICAL DECOMPRESSION/DISCECTOMY FUSION 1 LEVEL;  Surgeon: Floyce Stakes, MD;  Location: [facility];  Service: Neurosurgery;  Laterality: N/A;  Cervical four-five  Anterior cervical decompression/diskectomy/fusion  . Finger arthrodesis Right 04/07/2013    Procedure: RIGHT INDEX AND RIGHT LONG DISTAL INTERPHALANGEAL JOINT FUSIONS;  Surgeon: Schuyler Amor, MD;  Location: [facility];  Service: Orthopedics;  Laterality: Right;  . Finger arthroplasty Right 04/07/2013    Procedure: RIGHT THUMB [place] ARTHROPLASTY;  Surgeon: Schuyler Amor, MD;  Location: [facility];  Service: Orthopedics;  Laterality: Right;  . Dorsal compartment release Right 04/07/2013    Procedure: RIGHT WRIST STS RELEASE;  Surgeon: Schuyler Amor, MD;  Location: [facility];  Service: Orthopedics;   Laterality: Right;  . Ganglion cyst excision Right 04/07/2013    Procedure: RIGHT WRIST MASS EXCISION;  Surgeon: Schuyler Amor, MD;  Location: [facility];  Service: Orthopedics;  Laterality: Right;   Past Medical History  Diagnosis Date  . Osteoporosis   . Varicosities     venous  . Cardiomyopathy HX --06/2010    EF was 25% during acute illness (PHELONEPHRITIS) Repeat echo 12-06-10 60% showed normal EF.   Mar[place] Kitchen Hypertension   . Headache(784.0)   . PONV (postoperative nausea and vomiting)   . Heart murmur     DENIES S & S   (ECHO JUN'12 W/ CHART)  . History of chronic bronchitis   . Chronic back pain greater than 3 months duration     S/P BACK SURG'S  . Arachnoiditis BILATERAL LEGS    DUE TO MULTIPLE BACK SURG.'S  . Weakness of both legs DUE TO ARACHNOIDITIS    OCCASIONAL USES CANE  . Blood transfusion   . GERD (gastroesophageal reflux disease) AND HIATIAL HERNIA    CONTROLLED W/ NEXIUM  . Acute sinusitis, unspecified   . Anxiety state, unspecified   . Essential hypertension, benign   . Dyslipidemia   . Other malaise and fatigue   . Murmur, heart   . Shortness of breath   . Neuromuscular disorder     numbness and tingling  . Arthritis   . Hx of bladder infections   . Dysphagia     some post op cerv fusion 2/14  . Asthma    BP 159/76  Pulse 70  Resp 14  Ht 5' 5.73" (1.67 m)  Wt 188 lb 9.6 oz (85.548 kg)  BMI 30.67 kg/m2  SpO2 96%  Opioid Risk Score:   Fall Risk Score: Moderate Fall Risk (6-13 points)   Review of Systems     Objective:   Physical Exam  Nursing note and vitals reviewed. Constitutional: She is oriented to person, place, and time. She appears well-developed and well-nourished.  HENT:  Head: Normocephalic and atraumatic.  Neck: Normal range of motion. Neck supple.  Cardiovascular: Normal rate and regular rhythm.   Pulmonary/Chest: Effort normal and breath sounds normal.  Musculoskeletal:  Normal Muscle Bulk and Muscle Testing  Reveals: Upper Extremities: Full ROM and Muscle Strength 5/5 Thoracic and Lumbar Hypersensitivity Lower Extremities: Full ROM and Muscle Strength 5/5 Right Leg Flexion Produces Muscle Spasm Arises from chair wit slight difficulty/ Using three prong cane  Antalgic Gait   Neurological: She is alert and oriented to person, place, and time.  Skin: Skin is warm and dry.  Psychiatric: She has a normal mood and affect.          Assessment & Plan:  1. 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  Refilled: Oxycodone 15mg  one tablet every 6 hours as needed #120 and Opana 40 mg one tablet every 12 hours  # 60 .  2. 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 Continue gabapentin 600mg  TID. Encourage Posture and Stretching Exercise.   3. Anxiety/depression: Continue Valium and monitor.  4. Muscle Spasms: Continue Flexeril 5. Back Pain/ S/P Fall/: RX Thoracic and Lumbar X-ray  20 minutes of face to face patient care time was spent during this visit. All questions were encouraged and answered.   F/U in 1 month

## 2014-03-23 ENCOUNTER — Ambulatory Visit: Payer: Medicare Other | Admitting: Registered Nurse

## 2014-03-25 ENCOUNTER — Telehealth: Payer: Self-pay | Admitting: Registered Nurse

## 2014-03-25 NOTE — Telephone Encounter (Signed)
Called Ms. Harlow Asa and reviewed her X-ray Results

## 2014-03-27 ENCOUNTER — Other Ambulatory Visit: Payer: Self-pay | Admitting: Physical Medicine & Rehabilitation

## 2014-04-10 ENCOUNTER — Ambulatory Visit: Payer: Medicare Other | Admitting: Adult Health

## 2014-04-13 ENCOUNTER — Ambulatory Visit (INDEPENDENT_AMBULATORY_CARE_PROVIDER_SITE_OTHER): Payer: Medicare Other | Admitting: Adult Health

## 2014-04-13 ENCOUNTER — Encounter: Payer: Self-pay | Admitting: Adult Health

## 2014-04-13 ENCOUNTER — Encounter: Payer: Self-pay | Admitting: Registered Nurse

## 2014-04-13 ENCOUNTER — Encounter (HOSPITAL_BASED_OUTPATIENT_CLINIC_OR_DEPARTMENT_OTHER): Payer: Medicare Other | Admitting: Registered Nurse

## 2014-04-13 VITALS — BP 132/84 | HR 61 | Temp 96.8°F | Ht 63.5 in | Wt 190.6 lb

## 2014-04-13 VITALS — BP 161/90 | HR 62 | Resp 14 | Ht 63.5 in | Wt 185.0 lb

## 2014-04-13 DIAGNOSIS — M5441 Lumbago with sciatica, right side: Secondary | ICD-10-CM | POA: Diagnosis not present

## 2014-04-13 DIAGNOSIS — G039 Meningitis, unspecified: Secondary | ICD-10-CM

## 2014-04-13 DIAGNOSIS — Z79899 Other long term (current) drug therapy: Secondary | ICD-10-CM

## 2014-04-13 DIAGNOSIS — M961 Postlaminectomy syndrome, not elsewhere classified: Secondary | ICD-10-CM | POA: Diagnosis not present

## 2014-04-13 DIAGNOSIS — J4531 Mild persistent asthma with (acute) exacerbation: Secondary | ICD-10-CM

## 2014-04-13 MED ORDER — OXYCODONE HCL 15 MG PO TABS: 15.0000 mg | ORAL_TABLET | Freq: Four times a day (QID) | ORAL | Status: DC | PRN

## 2014-04-13 MED ORDER — GABAPENTIN 600 MG PO TABS: 600.0000 mg | ORAL_TABLET | Freq: Three times a day (TID) | ORAL | Status: DC

## 2014-04-13 MED ORDER — OXYMORPHONE HCL ER 40 MG PO TB12: 40.0000 mg | ORAL_TABLET | Freq: Two times a day (BID) | ORAL | Status: DC

## 2014-04-13 NOTE — Patient Instructions (Signed)
Hold Boniva for 1 month  Add Chlortrimeton 4mg  2 At bedtime   Continue on Zyrtec 10mg  daily in am  Saline nasal rinses As needed   Use Dymista nasal 2 puffs At bedtime  Until sample is gone.  Continue on Symbicort 2 puffs Twice daily  , rinse after use.  GERD diet  Follow up Dr. Chase Caller in 3 months and As needed   Please contact office for sooner follow up if symptoms do not improve or worsen or seek emergency care

## 2014-04-13 NOTE — Assessment & Plan Note (Signed)
Recent flare now resolving with AR triggers.  Spirometry today is nml  Goal is to control triggers ?GERD/AR     Plan  Hold Boniva for 1 month  Add Chlortrimeton 4mg  2 At bedtime   Continue on Zyrtec 10mg  daily in am  Saline nasal rinses As needed   Use Dymista nasal 2 puffs At bedtime  Until sample is gone.  Continue on Symbicort 2 puffs Twice daily  , rinse after use.  GERD diet  Follow up Dr. Chase Caller in 3 months and As needed   Please contact office for sooner follow up if symptoms do not improve or worsen or seek emergency care

## 2014-04-13 NOTE — Progress Notes (Signed)
Subjective:    Patient ID: [name] [name], female    DOB: 05/06/1951, 63 y.o.   MRN: [id number]       HPI  PCP is HASANAJ,XAJE A, MD Cards is dr Vita Barley  Body mass index is 26.83 kg/(m^2). Passive smoker x 17 years   IOV 04/22/2012 63 year old female. Referred for dyspnea. Wondering if she has passive smoking related lung disease (husband and son)  Dyspnea: In 2011 December was critically ill following spine surgery, had UTI and low EF but since then has recovered from critical illness. Says she has been dyspneic even before this critical illness for total 3-4 years but since past 1 year more noticeable dyspnea. Sometimes dyspneic walking from room to room. Sometimes dyspneic talking on phone. Triggers include cig smoke  Of husband ("husband's cig smoke is killing me and they think I am crazy") and is associated with cough at that time. Dyspnea is slowly progressive. Severity is mild-moderate. No orthopnea. No paroxysmal nocturnal dyspnea but has woken up wheezing at night since sept 2013 (was exposed to perfume at doctor office and dx acute bronchitis and given abx). THere is associated cough but only episodic.  There is no edema, hemoptysis, unintenional weight loss.   Walking desaturation test on 04/23/2012 185 feet x 3 laps:  did NOT desaturate. Rest pulse ox was 97%, final pulse ox was 94%. HR response 84/min at rest to 86/min at peak exertion.   RElevant hx   Fused L2-L5 x 3 spine surgery due to disc disease, osteoporosis, and also fall, various reasons  LABS - cxr sept 2012: clear lung fields - hgb nov 2012: 11.7gm%  - cardiac 04/01/12: Normal stress test with normal EF - no hx of PFTs  REC #Shortness of breath  - our check list says latex allergy is cautionary for flu shot; do not know why but we will defer flu shot today  - CMA/RN will walk you for oxygen levels now 04/22/2012  - please have full PFT breathing test at [place] or [place] or  at your  convenience; soon after doing it call us at [phone] and pass message on so I can review and advise next step      Telephone calls Dec - FEb 2014 PFT 05/27/12 is normal ecwpt mild reduction in diffusion (74%) and 8% BD response. These could be freatures of early emphysema, asthma, or scarring in lungs or normal variant. To better understand this - do HRCT chest. IF this is normal, needs methacholine challenge test for asthma and if that too is normal needs bike pulmonary stress test. She is aware of this algorithm. I will stay in touch by phone. For now, get CT chest (ordered)  CT chest does not show reason for shortness of breath. Next to do is methacholine challenge test; ordered. She is do and give [place] a call so I can review it. She know the algorithm. CT does show 80mm lung nodules -very very very rare < 1:1000 chance it is serious like cancer. She needs repeat ct chest in 1 year without cotnrast (ordered)  Methacholine challenge test on 08/07/2012 is positive for asthma. PD 20 at less than 4.0. No flow volume loop issues   OV 09/24/2012  Followup 3 mm lung nodule and Followup for dyspnea on the basis of positive methacholine challenge test   At the end of last visit she had diagnostic workup. She has been diagnosed with 3 mm lung nodule and also positive methacholine  challenge test. For the past few weeks she is taking Symbicort 2 puff twice daily. This is helping with her symptoms. She is absolutely petrified about developing lung cancer. She is upset that her husband and son continued to smoke and had a house and this is an etiological agent for lung cancer. She is also worried that this will make her asthma worse. I have agreed with her sentiments. There are no new issues   Past, Family, Social reviewed: no change since last visit other than the fact she had disc fusion surgery by Dr. Joya Salm   OV 07/01/2013  Chief Complaint  Patient presents with  . Follow-up    to review CT results. Pt  states she has been using xopenex at least once daily and also has albuterol neb that she is using 2-3 times a week.     FU  - asthma ( reports that she has never smoked. She has never used smokeless tobacco. But PASSIVE SMOKER +)  - lung nodule   In terms of asthma  - Noiv 2014 had AE-asthma due to virus. Rx by PCP. She is upset that hsuband and son do not listen to her and contiue to expose her to smoke and places hjer at risk for Ae-asthma. I have offered to counsel them at a future visit. She is out of symbicort but is compliant. She prefers xopenex mdi and can afford it. Wants scripts for both. Uptodate with flu shot  In terms of nodule  - stable 3 mm nodules at base Jan 2014 through Jan 2015. She is worried about cancer risk due to passive smoking and prefers followup Scans.     OV 03/13/2014  Chief Complaint  Patient presents with  . Follow-up    Pt states the summer months were causing her difficulty breathing. Pt states she has cough in evening and at night with intermittent mucus production, clear in color. Pt c/o DOE and chest tightness with activity.     FU asthma (on basis of positive methacholine challenge test)   - Says this summer 2015 has been a bit roiugh for her due to humidity; lot of down days with chest tightness and wheezing and dyspnea but never any ae-asthma till 2 weeks ago and then given Clarithromycin per hx and 7d prednisone. After this 75% better but still feels not at baseline. Just feels tired and some wheeze and some chest tightness. Currently no fever or chills;.Spuutum is small volume and white  - In addition a month ago when well some family members noted that she was blue on lips and fingers but she was asymptomatic then. Today walk test 185 feet x 3 laps: did not desaturate but did have dyspnea  04/13/2014 Follow up (Asthma-MCT positive )  She returns for 1 month follow up  Was seen last ov with Asthma flare , given steroid taper.  Does feel  better but still has intermittent wheezing.  Feels her  breathing is slightly improved but still having PND, hoarseness and increased DOE with wheezing on/off.  Started Zyrtec with some help.  Decreased cough . Wheezing not as bad, mostly at night or with increased activity. No chest pain.  Drainage is worst symptom.  Spirometry today shows nml lung volumes   FEV1 80%, ratio 74, and FVC 80%.  Denies chest pain, orthopnea, edema or hemoptysis .     Review of Systems  Constitutional: Negative for fever and unexpected weight change.  HENT: Positive for congestion. Negative for dental  problem, ear pain, nosebleeds, postnasal drip, rhinorrhea, sinus pressure, sneezing, sore throat and trouble swallowing.   Eyes: Negative for redness and itching.  Respiratory: Positive for cough, chest tightness and shortness of breath. Negative for wheezing.   Cardiovascular: Negative for palpitations and leg swelling.  Gastrointestinal: Negative for nausea and vomiting.  Genitourinary: Negative for dysuria.  Musculoskeletal: Negative for joint swelling.  Skin: Negative for rash.  Neurological: Negative for headaches.  Hematological: Does not bruise/bleed easily.  Psychiatric/Behavioral: Negative for dysphoric mood. The patient is not nervous/anxious.        Objective:   Physical Exam  GEN: A/Ox3; pleasant , NAD   HEENT:  [place]/AT,  EACs-clear, TMs-wnl, NOSE-clear  drainage , pale mucosa  THROAT-clear, no lesions, no postnasal drip or exudate noted.   NECK:  Supple w/ fair ROM; no JVD; normal carotid impulses w/o bruits; no thyromegaly or nodules palpated; no lymphadenopathy.  RESP  Clear  P & A; w/o, wheezes/ rales/ or rhonchi.no accessory muscle use, no dullness to percussion  CARD:  RRR, no m/r/g  , no peripheral edema, pulses intact, no cyanosis or clubbing.  GI:   Soft & nt; nml bowel sounds; no organomegaly or masses detected.  Musco: Warm bil, no deformities or joint swelling noted.   Neuro:  alert, no focal deficits noted.    Skin: Warm, no lesions or rashes '       Assessment & Plan:

## 2014-04-13 NOTE — Progress Notes (Signed)
Subjective:    Patient ID: [name] [name], female    DOB: Sep 02, 1950, 63 y.o.   MRN: [id number]  HPI: [name] [name] is a 63 year old female who returns for follow up for chronic pain and medication refill. She says her pain is located in her lower back and lower extremities posteriorly. She rates her pain 8. Her current exercise regime is walking.   Pain Inventory Average Pain 8 Pain Right Now 8 My pain is constant, sharp, burning, stabbing and tingling  In the last 24 hours, has pain interfered with the following? General activity 9 Relation with others 9 Enjoyment of life 9 What TIME of day is your pain at its worst? morning day evening night  Sleep (in general) Fair  Pain is worse with: walking, bending, sitting, inactivity, standing and some activites Pain improves with: medication Relief from Meds: 7  Mobility use a cane ability to climb steps?  yes do you drive?  yes  Function disabled: date disabled .  Neuro/Psych No problems in this area  Prior Studies Any changes since last visit?  yes  Physicians involved in your care Any changes since last visit?  yes   Family History  Problem Relation Age of Onset  . Hypertension Mother   . Heart disease Mother   . Diabetes Mother   . Cancer Father   . Diabetes Brother    History   Social History  . Marital Status: Married    Spouse Name: N/A    Number of Children: N/A  . Years of Education: N/A   Occupational History  . disable    Social History Main Topics  . Smoking status: Never Smoker   . Smokeless tobacco: Never Used  . Alcohol Use: No  . Drug Use: No  . Sexual Activity: None     Comment: second hand smoke   Other Topics Concern  . None   Social History Narrative   Has one child   Disabled   Past Surgical History  Procedure Laterality Date  . Lumbar fusion  11-30-10    L4 - 5  . Exploration of incision for csf leak  DEC 2011  X2    POST LAMINECOTMY  . Lumbar laminectomy  DEC  2011    L4 - 5  . Lumbar fusion  2000    L2 - 4  . Cervical disc surgery  2005    C5 - 6  . Anterior fusion cervical spine  2007    C5 -6  . Tendon repair  JAN 2010    LEFT INDEX AND LONG FINGERS  . Left wrist tenosynectomy w/ left thumb joint repair  02-24-10  . Abdominal hysterectomy  1987    W/ BSO  . Cholecystectomy  1994  . Knee arthroscopy  LEFT X3 (LAST ONE 2005)  . Carpal tunnel release  RIGHT - 2001  & DEC 2011 W/ BACK SURG.  . Knee arthroscopy  05/17/2011    Procedure: ARTHROSCOPY KNEE;  Surgeon: Bradley Ferris III;  Location: [facility];  Service: Orthopedics;  Laterality: Right;  WITH MEDIAL MENISECTOMY AND removal of suprapatella fat lump  . Anterior cervical decomp/discectomy fusion N/A 08/09/2012    Procedure: ANTERIOR CERVICAL DECOMPRESSION/DISCECTOMY FUSION 1 LEVEL;  Surgeon: Floyce Stakes, MD;  Location: [facility];  Service: Neurosurgery;  Laterality: N/A;  Cervical four-five  Anterior cervical decompression/diskectomy/fusion  . Finger arthrodesis Right 04/07/2013    Procedure: RIGHT INDEX AND RIGHT LONG  DISTAL INTERPHALANGEAL JOINT FUSIONS;  Surgeon: Schuyler Amor, MD;  Location: [place];  Service: Orthopedics;  Laterality: Right;  . Finger arthroplasty Right 04/07/2013    Procedure: RIGHT THUMB [place] ARTHROPLASTY;  Surgeon: Schuyler Amor, MD;  Location: [place];  Service: Orthopedics;  Laterality: Right;  . Dorsal compartment release Right 04/07/2013    Procedure: RIGHT WRIST STS RELEASE;  Surgeon: Schuyler Amor, MD;  Location: [facility];  Service: Orthopedics;  Laterality: Right;  . Ganglion cyst excision Right 04/07/2013    Procedure: RIGHT WRIST MASS EXCISION;  Surgeon: Schuyler Amor, MD;  Location: [place];  Service: Orthopedics;  Laterality: Right;   Past Medical History  Diagnosis Date  . Osteoporosis   . Varicosities     venous  . Cardiomyopathy HX  --06/2010    EF was 25% during acute illness (PHELONEPHRITIS) Repeat echo 12-06-10 60% showed normal EF.   [place] Kitchen Hypertension   . Headache(784.0)   . PONV (postoperative nausea and vomiting)   . Heart murmur     DENIES S & S   (ECHO JUN'12 W/ CHART)  . History of chronic bronchitis   . Chronic back pain greater than 3 months duration     S/P BACK SURG'S  . Arachnoiditis BILATERAL LEGS    DUE TO MULTIPLE BACK SURG.'S  . Weakness of both legs DUE TO ARACHNOIDITIS    OCCASIONAL USES CANE  . Blood transfusion   . GERD (gastroesophageal reflux disease) AND HIATIAL HERNIA    CONTROLLED W/ NEXIUM  . Acute sinusitis, unspecified   . Anxiety state, unspecified   . Essential hypertension, benign   . Dyslipidemia   . Other malaise and fatigue   . Murmur, heart   . Shortness of breath   . Neuromuscular disorder     numbness and tingling  . Arthritis   . Hx of bladder infections   . Dysphagia     some post op cerv fusion 2/14  . Asthma    BP 161/90  Pulse 62  Resp 14  Ht 5' 3.5" (1.613 m)  Wt 185 lb (83.915 kg)  BMI 32.25 kg/m2  SpO2 100%  Opioid Risk Score:   Fall Risk Score:   Review of Systems  All other systems reviewed and are negative.      Objective:   Physical Exam  Nursing note and vitals reviewed. Constitutional: She is oriented to person, place, and time. She appears well-developed and well-nourished.  HENT:  Head: Normocephalic and atraumatic.  Neck: Normal range of motion. Neck supple.  Cardiovascular: Normal rate and regular rhythm.   Pulmonary/Chest: Effort normal and breath sounds normal.  Musculoskeletal:  Normal Muscle Bulk and Muscle testing Reveals: Upper Extremities: Decreased ROM 90 Degrees and Muscle Strength 5/5 Spinal Forward Flexion 30 Degrees and Extension 20 Degrees Thoracic and Lumbar Hypersensitivity Lower Extremities: Full ROM and Muscle strength 5/5 Arises from chair with ease/ Using 3 Prong Cane for support Narrow Based gait     Neurological: She is alert and oriented to person, place, and time.  Skin: Skin is warm and dry.  Psychiatric: She has a normal mood and affect.          Assessment & Plan:  1. 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  Refilled: Oxycodone 15mg  one tablet every 6 hours as needed #120 and Opana 40 mg one tablet every 12 hours  # 60 . Second script given. 2. 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 Continue gabapentin 600mg  TID. Encourage Posture and Stretching Exercise.  3. Anxiety/depression: Continue Valium and monitor.  4. Muscle Spasms: Continue Flexeril   20 minutes of face to face patient care time was spent during this visit. All questions were encouraged and answered.   F/U in 2 month

## 2014-04-16 MED ORDER — AZELASTINE-FLUTICASONE 137-50 MCG/ACT NA SUSP: 2.0000 | Freq: Every day | NASAL | Status: DC

## 2014-04-16 NOTE — Addendum Note (Signed)
Addended by: Parke Poisson E on: 04/16/2014 05:29 PM   Modules accepted: Orders

## 2014-04-20 DIAGNOSIS — Z6833 Body mass index (BMI) 33.0-33.9, adult: Secondary | ICD-10-CM | POA: Diagnosis not present

## 2014-04-20 DIAGNOSIS — M542 Cervicalgia: Secondary | ICD-10-CM | POA: Diagnosis not present

## 2014-04-24 ENCOUNTER — Telehealth: Payer: Self-pay | Admitting: Internal Medicine

## 2014-04-24 MED ORDER — FLUTICASONE PROPIONATE 50 MCG/ACT NA SUSP: 2.0000 | Freq: Every day | NASAL | Status: DC

## 2014-04-24 MED ORDER — AZELASTINE HCL 0.1 % NA SOLN: 2.0000 | Freq: Every day | NASAL | Status: DC

## 2014-04-24 NOTE — Telephone Encounter (Signed)
I spoke with the pt and she states that dymista is not covered but the ingredients, fluticasone and astelin are covered. She is asking for an rx be sent for each of these medications. Rx sent. Pt is aware. [place], CMA

## 2014-04-28 ENCOUNTER — Other Ambulatory Visit: Payer: Self-pay | Admitting: Neurosurgery

## 2014-04-28 DIAGNOSIS — M542 Cervicalgia: Secondary | ICD-10-CM

## 2014-05-08 ENCOUNTER — Ambulatory Visit
Admission: RE | Admit: 2014-05-08 | Discharge: 2014-05-08 | Disposition: A | Discharge status: Home / Self Care | Payer: Medicare Other | Source: Ambulatory Visit | Attending: Neurosurgery | Admitting: Neurosurgery

## 2014-05-08 DIAGNOSIS — M47813 Spondylosis without myelopathy or radiculopathy, cervicothoracic region: Secondary | ICD-10-CM | POA: Diagnosis not present

## 2014-05-08 DIAGNOSIS — G039 Meningitis, unspecified: Secondary | ICD-10-CM | POA: Diagnosis not present

## 2014-05-08 DIAGNOSIS — M5412 Radiculopathy, cervical region: Secondary | ICD-10-CM

## 2014-05-08 DIAGNOSIS — M545 Low back pain: Secondary | ICD-10-CM | POA: Diagnosis not present

## 2014-05-08 DIAGNOSIS — M5124 Other intervertebral disc displacement, thoracic region: Secondary | ICD-10-CM | POA: Diagnosis not present

## 2014-05-08 DIAGNOSIS — M542 Cervicalgia: Secondary | ICD-10-CM

## 2014-05-08 DIAGNOSIS — M4326 Fusion of spine, lumbar region: Secondary | ICD-10-CM | POA: Diagnosis not present

## 2014-05-08 DIAGNOSIS — M961 Postlaminectomy syndrome, not elsewhere classified: Secondary | ICD-10-CM

## 2014-05-08 DIAGNOSIS — M4322 Fusion of spine, cervical region: Secondary | ICD-10-CM | POA: Diagnosis not present

## 2014-05-08 DIAGNOSIS — M546 Pain in thoracic spine: Secondary | ICD-10-CM | POA: Diagnosis not present

## 2014-05-08 DIAGNOSIS — S22039D Unspecified fracture of third thoracic vertebra, subsequent encounter for fracture with routine healing: Secondary | ICD-10-CM | POA: Diagnosis not present

## 2014-05-08 DIAGNOSIS — S22059D Unspecified fracture of T5-T6 vertebra, subsequent encounter for fracture with routine healing: Secondary | ICD-10-CM | POA: Diagnosis not present

## 2014-05-08 MED ORDER — MEPERIDINE HCL 100 MG/ML IJ SOLN
100.0000 mg | Freq: Once | INTRAMUSCULAR | Status: AC
  Administered 2014-05-08: 100 mg via INTRAMUSCULAR

## 2014-05-08 MED ORDER — IOHEXOL 300 MG/ML  SOLN
9.0000 mL | Freq: Once | INTRAMUSCULAR | Status: AC | PRN
  Administered 2014-05-08: 10 mL via INTRATHECAL

## 2014-05-08 MED ORDER — ONDANSETRON HCL 4 MG/2ML IJ SOLN: 4.0000 mg | Freq: Four times a day (QID) | INTRAMUSCULAR | Status: DC | PRN

## 2014-05-08 MED ORDER — ONDANSETRON HCL 4 MG/2ML IJ SOLN
4.0000 mg | Freq: Once | INTRAMUSCULAR | Status: AC
Start: 2014-05-08 — End: 2014-05-08
  Administered 2014-05-08: 4 mg via INTRAMUSCULAR

## 2014-05-08 NOTE — Progress Notes (Signed)
Patient states she took her own Valium and pain pill at home before coming for today's myelogram.  Does not want to take any of our medications per her pain management contract.  She states her last dose of Amitriptyline was at least two days ago.

## 2014-05-08 NOTE — Progress Notes (Signed)
I notified [name] [name] with Dr. Naaman Plummer that [name] [name] was here for a total myelogram today, and that I medicated her with Demerol 100mg  and Zofran 4mg  IM during the procedure due to the extreme pain patient was in.  I understand [name] [name] is under a contract with her pain doctor, Dr. Naaman Plummer, so I wanted to be sure they knew we had strongly suggested she have an injection to relieve her pain so that she could finish the procedure Dr. Joya Salm ordered.  She had premedicated at home with Valium and pain medication, but that wasn't enough to cover the extra pain she suffered here.  Brita Romp, RN, [facility] [phone])

## 2014-05-08 NOTE — Discharge Instructions (Signed)
Myelogram Discharge Instructions  1. Go home and rest quietly for the next 24 hours.  It is important to lie flat for the next 24 hours.  Get up only to go to the restroom.  You may lie in the bed or on a couch on your back, your stomach, your left side or your right side.  You may have one pillow under your head.  You may have pillows between your knees while you are on your side or under your knees while you are on your back.  2. DO NOT drive today.  Recline the seat as far back as it will go, while still wearing your seat belt, on the way home.  3. You may get up to go to the bathroom as needed.  You may sit up for 10 minutes to eat.  You may resume your normal diet and medications unless otherwise indicated.  Drink lots of extra fluids today and tomorrow.  4. The incidence of headache, nausea, or vomiting is about 5% (one in 20 patients).  If you develop a headache, lie flat and drink plenty of fluids until the headache goes away.  Caffeinated beverages may be helpful.  If you develop severe nausea and vomiting or a headache that does not go away with flat bed rest, call [phone].  5. You may resume normal activities after your 24 hours of bed rest is over; however, do not exert yourself strongly or do any heavy lifting tomorrow. If when you get up you have a headache when standing, go back to bed and force fluids for another 24 hours.  6. Call your physician for a follow-up appointment.  The results of your myelogram will be sent directly to your physician by the following day.  7. If you have any questions or if complications develop after you arrive home, please call [phone].  Discharge instructions have been explained to the patient.  The patient, or the person responsible for the patient, fully understands these instructions.      May resume Amitriptyline on Nov. 21, 2015, after 1:00 pm.

## 2014-05-17 DIAGNOSIS — Z23 Encounter for immunization: Secondary | ICD-10-CM | POA: Diagnosis not present

## 2014-05-26 DIAGNOSIS — I10 Essential (primary) hypertension: Secondary | ICD-10-CM | POA: Diagnosis not present

## 2014-05-26 DIAGNOSIS — J309 Allergic rhinitis, unspecified: Secondary | ICD-10-CM | POA: Diagnosis not present

## 2014-05-26 DIAGNOSIS — L570 Actinic keratosis: Secondary | ICD-10-CM | POA: Diagnosis not present

## 2014-06-15 ENCOUNTER — Encounter: Payer: Self-pay | Admitting: Registered Nurse

## 2014-06-15 ENCOUNTER — Encounter: Payer: Medicare Other | Attending: Physical Medicine & Rehabilitation | Admitting: Registered Nurse

## 2014-06-15 VITALS — BP 140/70 | HR 70 | Resp 14

## 2014-06-15 DIAGNOSIS — Z79899 Other long term (current) drug therapy: Secondary | ICD-10-CM | POA: Insufficient documentation

## 2014-06-15 DIAGNOSIS — Z5181 Encounter for therapeutic drug level monitoring: Secondary | ICD-10-CM | POA: Insufficient documentation

## 2014-06-15 DIAGNOSIS — G039 Meningitis, unspecified: Secondary | ICD-10-CM | POA: Insufficient documentation

## 2014-06-15 DIAGNOSIS — M5441 Lumbago with sciatica, right side: Secondary | ICD-10-CM | POA: Diagnosis not present

## 2014-06-15 DIAGNOSIS — M961 Postlaminectomy syndrome, not elsewhere classified: Secondary | ICD-10-CM | POA: Diagnosis not present

## 2014-06-15 MED ORDER — OXYCODONE HCL 15 MG PO TABS: 15.0000 mg | ORAL_TABLET | Freq: Four times a day (QID) | ORAL | Status: DC | PRN

## 2014-06-15 MED ORDER — OXYMORPHONE HCL ER 40 MG PO T12A: 40.0000 mg | EXTENDED_RELEASE_TABLET | Freq: Two times a day (BID) | ORAL | Status: DC

## 2014-06-15 MED ORDER — OXYMORPHONE HCL ER 40 MG PO TB12: 40.0000 mg | ORAL_TABLET | Freq: Two times a day (BID) | ORAL | Status: DC

## 2014-06-15 NOTE — Progress Notes (Signed)
Subjective:    Patient ID: [name] [name], female    DOB: 01-14-1951, 63 y.o.   MRN: [id number]  HPI: [name] [name] is a 63 year old female who returns for follow up for chronic pain and medication refill. She says her pain is located in her neck and entire back. She rates her pain 9. Her current exercise regime is walking.  She is very tearful concerned about her son's health emotional support given. Also pamphlet given for [facility] she verbalizes understanding.  Pain Inventory Average Pain 8 Pain Right Now 9 My pain is constant, sharp, burning, tingling and aching  In the last 24 hours, has pain interfered with the following? General activity 9 Relation with others 9 Enjoyment of life 9 What TIME of day is your pain at its worst? all Sleep (in general) Fair  Pain is worse with: walking, sitting, standing and some activites Pain improves with: rest and medication Relief from Meds: 5  Mobility walk without assistance ability to climb steps?  yes do you drive?  yes  Function disabled: date disabled .  Neuro/Psych No problems in this area  Prior Studies Any changes since last visit?  no  Physicians involved in your care Any changes since last visit?  no   Family History  Problem Relation Age of Onset  . Hypertension Mother   . Heart disease Mother   . Diabetes Mother   . Cancer Father   . Diabetes Brother    History   Social History  . Marital Status: Married    Spouse Name: N/A    Number of Children: N/A  . Years of Education: N/A   Occupational History  . disable    Social History Main Topics  . Smoking status: Never Smoker   . Smokeless tobacco: Never Used  . Alcohol Use: No  . Drug Use: No  . Sexual Activity: None     Comment: second hand smoke   Other Topics Concern  . None   Social History Narrative   Has one child   Disabled   Past Surgical History  Procedure Laterality Date  . Lumbar fusion  11-30-10    L4 - 5  .  Exploration of incision for csf leak  DEC 2011  X2    POST LAMINECOTMY  . Lumbar laminectomy  DEC 2011    L4 - 5  . Lumbar fusion  2000    L2 - 4  . Cervical disc surgery  2005    C5 - 6  . Anterior fusion cervical spine  2007    C5 -6  . Tendon repair  JAN 2010    LEFT INDEX AND LONG FINGERS  . Left wrist tenosynectomy w/ left thumb joint repair  02-24-10  . Abdominal hysterectomy  1987    W/ BSO  . Cholecystectomy  1994  . Knee arthroscopy  LEFT X3 (LAST ONE 2005)  . Carpal tunnel release  RIGHT - 2001  & DEC 2011 W/ BACK SURG.  . Knee arthroscopy  05/17/2011    Procedure: ARTHROSCOPY KNEE;  Surgeon: Bradley Ferris III;  Location: [facility];  Service: Orthopedics;  Laterality: Right;  WITH MEDIAL MENISECTOMY AND removal of suprapatella fat lump  . Anterior cervical decomp/discectomy fusion N/A 08/09/2012    Procedure: ANTERIOR CERVICAL DECOMPRESSION/DISCECTOMY FUSION 1 LEVEL;  Surgeon: Floyce Stakes, MD;  Location: [facility];  Service: Neurosurgery;  Laterality: N/A;  Cervical four-five  Anterior cervical  decompression/diskectomy/fusion  . Finger arthrodesis Right 04/07/2013    Procedure: RIGHT INDEX AND RIGHT LONG DISTAL INTERPHALANGEAL JOINT FUSIONS;  Surgeon: Schuyler Amor, MD;  Location: [facility];  Service: Orthopedics;  Laterality: Right;  . Finger arthroplasty Right 04/07/2013    Procedure: RIGHT THUMB [place] ARTHROPLASTY;  Surgeon: Schuyler Amor, MD;  Location: [facility];  Service: Orthopedics;  Laterality: Right;  . Dorsal compartment release Right 04/07/2013    Procedure: RIGHT WRIST STS RELEASE;  Surgeon: Schuyler Amor, MD;  Location: [facility];  Service: Orthopedics;  Laterality: Right;  . Ganglion cyst excision Right 04/07/2013    Procedure: RIGHT WRIST MASS EXCISION;  Surgeon: Schuyler Amor, MD;  Location: [facility];  Service: Orthopedics;  Laterality: Right;   Past  Medical History  Diagnosis Date  . Osteoporosis   . Varicosities     venous  . Cardiomyopathy HX --06/2010    EF was 25% during acute illness (PHELONEPHRITIS) Repeat echo 12-06-10 60% showed normal EF.   Marland Kitchen Hypertension   . Headache(784.0)   . PONV (postoperative nausea and vomiting)   . Heart murmur     DENIES S & S   (ECHO JUN'12 W/ CHART)  . History of chronic bronchitis   . Chronic back pain greater than 3 months duration     S/P BACK SURG'S  . Arachnoiditis BILATERAL LEGS    DUE TO MULTIPLE BACK SURG.'S  . Weakness of both legs DUE TO ARACHNOIDITIS    OCCASIONAL USES CANE  . Blood transfusion   . GERD (gastroesophageal reflux disease) AND HIATIAL HERNIA    CONTROLLED W/ NEXIUM  . Acute sinusitis, unspecified   . Anxiety state, unspecified   . Essential hypertension, benign   . Dyslipidemia   . Other malaise and fatigue   . Murmur, heart   . Shortness of breath   . Neuromuscular disorder     numbness and tingling  . Arthritis   . Hx of bladder infections   . Dysphagia     some post op cerv fusion 2/14  . Asthma    BP 140/70 mmHg  Pulse 70  Resp 14  SpO2 100%  Opioid Risk Score:   Fall Risk Score: Moderate Fall Risk (6-13 points)  Review of Systems  Constitutional: Negative.   HENT: Negative.   Eyes: Negative.   Respiratory: Negative.   Cardiovascular: Negative.   Gastrointestinal: Negative.   Endocrine: Negative.   Genitourinary: Negative.   Musculoskeletal: Positive for myalgias, back pain and neck pain.  Skin: Negative.   Allergic/Immunologic: Negative.   Neurological: Negative.   Hematological: Negative.   Psychiatric/Behavioral: Positive for dysphoric mood.       Objective:   Physical Exam  Constitutional: She is oriented to person, place, and time. She appears well-developed and well-nourished.  HENT:  Head: Normocephalic and atraumatic.  Neck: Normal range of motion. Neck supple.  Cervical Paraspinal Tenderness: C-5- C-7  Cardiovascular:  Normal rate and regular rhythm.   Pulmonary/Chest: Effort normal and breath sounds normal.  Musculoskeletal:  Normal Muscle Bulk and Muscle Strength Reveals: Upper Extremities: Right Decreased ROM 90 Degrees and Muscle strength 4/5. Left: Full ROM and Muscle Strength 4/5 Thoracic Paraspinal Tenderness: T-1- T-3 Lumbar Paraspinal Tenderness: L-3- L-5 Lower Extremities: Full ROM and Muscle strength 5/5 Lower Extremities Flexion Produces Pain into Lower extremities. Arises from chair with ease Using 3 Prong Cane for support   Neurological: She is alert and oriented to person,  place, and time.  Skin: Skin is warm and dry.  Psychiatric: She has a normal mood and affect.  Nursing note and vitals reviewed.         Assessment & Plan:  1. 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  Refilled: Oxycodone 15mg  one tablet every 6 hours as needed #120 and Opana 40 mg one tablet every 12 hours  # 60 . Second script given. 2. 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  Dr. Joya Salm Following. Continue Gabapentin 600mg  TID. Encourage Posture and Stretching Exercise.  3. Anxiety/depression: Continue Valium and monitor.  4. Muscle Spasms: Continue Flexeril   20 minutes of face to face patient care time was spent during this visit. All questions were encouraged and answered.   F/U in 2 month

## 2014-06-15 NOTE — Patient Instructions (Signed)
Arachnoiditis Arachnoiditis is a pain disorder caused by an inflammatory response of the arachnoid. The arachnoid is 1 of 3 coverings or membranes (meninges) that cover the brain and the nerves of the spinal cord.  CAUSES  Arachnoiditis may be caused from:  Infection. This includes infection from syphilis, tubercular meningitis, or other bacteria or viruses.  Damage caused by an accident (trauma) that results in direct injury to the spine.  Surgery.  A lumbar puncture.  Medications used with spinal anesthesia.  A diagnostic procedure called a myelogram. SYMPTOMS  Symptoms include:  Numbness.  Tingling.  Stinging and burning pain in the legs. DIAGNOSIS A thorough history and physical can generally help make the diagnosis. If needed, imaging studies may be ordered. Examples of imaging studies include MRI and CT scans. TREATMENT  The goal of treatment is to return the patient to a workable (functional) role in society. In general, treatment focuses on pain relief and the improvement of symptoms that impair daily function. Other treatments methods may include:  Physical therapy (physiotherapy).  Exercise.  Psychotherapy.  Surgery to remove adhesions. In patients whose arachnoiditis is progressive, surgery to remove adhesions is only slightly effective. This is because scar tissue continues to develop. Also, surgery exposes the already irritated spinal cord to additional trauma. There is no cure for this disease. For most patients, it is a disabling disease. It causes pain that is difficult to manage and neurological problems. As the disease progresses, some symptoms may increase. They may also become permanent. Few people with this disorder are able to continue working. In some cases, progressive paralysis of the lower half of the body may occur. This is known as paraplegia. Document Released: 05/26/2002 Document Revised: 08/28/2011 Document Reviewed: 10/18/2010 [facility] Patient  Information 2015 [place], [place]. This information is not intended to replace advice given to you by your health care provider. Make sure you discuss any questions you have with your health care provider.

## 2014-06-18 ENCOUNTER — Ambulatory Visit (INDEPENDENT_AMBULATORY_CARE_PROVIDER_SITE_OTHER): Payer: Medicare Other | Admitting: Adult Health

## 2014-06-18 ENCOUNTER — Encounter: Payer: Self-pay | Admitting: Adult Health

## 2014-06-18 VITALS — BP 124/86 | HR 65 | Temp 97.8°F | Ht 63.5 in | Wt 185.0 lb

## 2014-06-18 DIAGNOSIS — J4531 Mild persistent asthma with (acute) exacerbation: Secondary | ICD-10-CM

## 2014-06-18 MED ORDER — AMOXICILLIN-POT CLAVULANATE 875-125 MG PO TABS: 1.0000 | ORAL_TABLET | Freq: Two times a day (BID) | ORAL | Status: AC

## 2014-06-18 MED ORDER — PREDNISONE 10 MG PO TABS: ORAL_TABLET | ORAL | Status: DC

## 2014-06-18 NOTE — Progress Notes (Signed)
Subjective:    Patient ID: [name] [name], female    DOB: 05/06/1951, 63 y.o.   MRN: [id number]       HPI  PCP is HASANAJ,XAJE A, MD Cards is dr Vita Barley  Body mass index is 26.83 kg/(m^2). Passive smoker x 17 years   IOV 04/22/2012 63 year old female. Referred for dyspnea. Wondering if she has passive smoking related lung disease (husband and son)  Dyspnea: In 2011 December was critically ill following spine surgery, had UTI and low EF but since then has recovered from critical illness. Says she has been dyspneic even before this critical illness for total 3-4 years but since past 1 year more noticeable dyspnea. Sometimes dyspneic walking from room to room. Sometimes dyspneic talking on phone. Triggers include cig smoke  Of husband ("husband's cig smoke is killing me and they think I am crazy") and is associated with cough at that time. Dyspnea is slowly progressive. Severity is mild-moderate. No orthopnea. No paroxysmal nocturnal dyspnea but has woken up wheezing at night since sept 2013 (was exposed to perfume at doctor office and dx acute bronchitis and given abx). THere is associated cough but only episodic.  There is no edema, hemoptysis, unintenional weight loss.   Walking desaturation test on 04/23/2012 185 feet x 3 laps:  did NOT desaturate. Rest pulse ox was 97%, final pulse ox was 94%. HR response 84/min at rest to 86/min at peak exertion.   RElevant hx   Fused L2-L5 x 3 spine surgery due to disc disease, osteoporosis, and also fall, various reasons  LABS - cxr sept 2012: clear lung fields - hgb nov 2012: 11.7gm%  - cardiac 04/01/12: Normal stress test with normal EF - no hx of PFTs  REC #Shortness of breath  - our check list says latex allergy is cautionary for flu shot; do not know why but we will defer flu shot today  - CMA/RN will walk you for oxygen levels now 04/22/2012  - please have full PFT breathing test at [place] or [place] or  at your  convenience; soon after doing it call us at [phone] and pass message on so I can review and advise next step      Telephone calls Dec - FEb 2014 PFT 05/27/12 is normal ecwpt mild reduction in diffusion (74%) and 8% BD response. These could be freatures of early emphysema, asthma, or scarring in lungs or normal variant. To better understand this - do HRCT chest. IF this is normal, needs methacholine challenge test for asthma and if that too is normal needs bike pulmonary stress test. She is aware of this algorithm. I will stay in touch by phone. For now, get CT chest (ordered)  CT chest does not show reason for shortness of breath. Next to do is methacholine challenge test; ordered. She is do and give [place] a call so I can review it. She know the algorithm. CT does show 80mm lung nodules -very very very rare < 1:1000 chance it is serious like cancer. She needs repeat ct chest in 1 year without cotnrast (ordered)  Methacholine challenge test on 08/07/2012 is positive for asthma. PD 20 at less than 4.0. No flow volume loop issues   OV 09/24/2012  Followup 3 mm lung nodule and Followup for dyspnea on the basis of positive methacholine challenge test   At the end of last visit she had diagnostic workup. She has been diagnosed with 3 mm lung nodule and also positive methacholine  challenge test. For the past few weeks she is taking Symbicort 2 puff twice daily. This is helping with her symptoms. She is absolutely petrified about developing lung cancer. She is upset that her husband and son continued to smoke and had a house and this is an etiological agent for lung cancer. She is also worried that this will make her asthma worse. I have agreed with her sentiments. There are no new issues   Past, Family, Social reviewed: no change since last visit other than the fact she had disc fusion surgery by Dr. Joya Salm   OV 07/01/2013  Chief Complaint  Patient presents with  . Follow-up    to review CT results. Pt  states she has been using xopenex at least once daily and also has albuterol neb that she is using 2-3 times a week.     FU  - asthma ( reports that she has never smoked. She has never used smokeless tobacco. But PASSIVE SMOKER +)  - lung nodule   In terms of asthma  - Noiv 2014 had AE-asthma due to virus. Rx by PCP. She is upset that hsuband and son do not listen to her and contiue to expose her to smoke and places hjer at risk for Ae-asthma. I have offered to counsel them at a future visit. She is out of symbicort but is compliant. She prefers xopenex mdi and can afford it. Wants scripts for both. Uptodate with flu shot  In terms of nodule  - stable 3 mm nodules at base Jan 2014 through Jan 2015. She is worried about cancer risk due to passive smoking and prefers followup Scans.     OV 03/13/2014  Chief Complaint  Patient presents with  . Follow-up    Pt states the summer months were causing her difficulty breathing. Pt states she has cough in evening and at night with intermittent mucus production, clear in color. Pt c/o DOE and chest tightness with activity.     FU asthma (on basis of positive methacholine challenge test)   - Says this summer 2015 has been a bit roiugh for her due to humidity; lot of down days with chest tightness and wheezing and dyspnea but never any ae-asthma till 2 weeks ago and then given Clarithromycin per hx and 7d prednisone. After this 75% better but still feels not at baseline. Just feels tired and some wheeze and some chest tightness. Currently no fever or chills;.Spuutum is small volume and white  - In addition a month ago when well some family members noted that she was blue on lips and fingers but she was asymptomatic then. Today walk test 185 feet x 3 laps: did not desaturate but did have dyspnea  04/13/2014 Follow up (Asthma-MCT positive )  She returns for 1 month follow up  Was seen last ov with Asthma flare , given steroid taper.  Does feel  better but still has intermittent wheezing.  Feels her  breathing is slightly improved but still having PND, hoarseness and increased DOE with wheezing on/off.  Started Zyrtec with some help.  Decreased cough . Wheezing not as bad, mostly at night or with increased activity. No chest pain.  Drainage is worst symptom.  Spirometry today shows nml lung volumes   FEV1 80%, ratio 74, and FVC 80%.  Denies chest pain, orthopnea, edema or hemoptysis .  >>AR tx regimen   06/18/2014 Acute OV  Complains of sinus pressure, sinus drainage, cough, congestion , wheezing and nasal stuffiness for  1 week.   coughing up thick mucus and blowing out thick mucus. Using her nasal spray and zyrtec without any relief.   she is taking Mucinex without much relief  She denies any chest pain, orthopnea, PND or leg swelling    Review of Systems  Constitutional: Negative for fever and unexpected weight change.  HENT: Positive for congestion. Negative for dental problem, ear pain, nosebleeds, postnasal drip,  +rhinorrhea, sinus pressure, sneezing,  Eyes: Negative for redness and itching.  Respiratory: Positive for cough, chest tightness Cardiovascular: Negative for palpitations and leg swelling.  Gastrointestinal: Negative for nausea and vomiting.  Genitourinary: Negative for dysuria.  Musculoskeletal: Negative for joint swelling.  Skin: Negative for rash.  Neurological: Negative for headaches.  Hematological: Does not bruise/bleed easily.  Psychiatric/Behavioral: Negative for dysphoric mood. The patient is not nervous/anxious.        Objective:   Physical Exam  GEN: A/Ox3; pleasant , NAD   HEENT:  [place]/AT,  EACs-clear, TMs-wnl, NOSE-clear drainage , max sinus tenderness  THROAT-clear, no lesions, no postnasal drip or exudate noted.   NECK:  Supple w/ fair ROM; no JVD; normal carotid impulses w/o bruits; no thyromegaly or nodules palpated; no lymphadenopathy.  RESP  Few trace wheezes , no accessory muscle  use, no dullness to percussion  CARD:  RRR, no m/r/g  , no peripheral edema, pulses intact, no cyanosis or clubbing.  GI:   Soft & nt; nml bowel sounds; no organomegaly or masses detected.  Musco: Warm bil, no deformities or joint swelling noted.   Neuro: alert, no focal deficits noted.    Skin: Warm, no lesions or rashes '       Assessment & Plan:

## 2014-06-18 NOTE — Assessment & Plan Note (Signed)
Flare with bronchitis and sinusitis   Plan  Augmentin 875 mg twice daily for 10 days, take with food Prednisone taper over the next week Mucinex DM twice daily as needed for cough and congestion Saline nasal rinses as needed. Please contact office for sooner follow up if symptoms do not improve or worsen or seek emergency care  Follow up Dr. Chase Caller in 6-8 weeks and As needed

## 2014-06-18 NOTE — Patient Instructions (Signed)
Augmentin 875 mg twice daily for 10 days, take with food Prednisone taper over the next week Mucinex DM twice daily as needed for cough and congestion Saline nasal rinses as needed. Please contact office for sooner follow up if symptoms do not improve or worsen or seek emergency care  Follow up Dr. Chase Caller in 6-8 weeks and As needed

## 2014-08-03 ENCOUNTER — Telehealth: Payer: Self-pay | Admitting: Registered Nurse

## 2014-08-03 ENCOUNTER — Other Ambulatory Visit: Payer: Self-pay | Admitting: Registered Nurse

## 2014-08-05 NOTE — Telephone Encounter (Signed)
Patient will need to come in and be seen to get her RX

## 2014-08-05 NOTE — Telephone Encounter (Signed)
Called patient and left message stating that she will need to be seen to get her RX - they cannot be called in.  Told her to call and make appt tomorrow or Friday with  Zella Ball

## 2014-08-06 ENCOUNTER — Telehealth: Payer: Self-pay | Admitting: *Deleted

## 2014-08-06 NOTE — Telephone Encounter (Signed)
Pt called said she called earlier about getting rx's filled, was told she needs to make an appt. She says she already has an appt 08/18/2014 with Dr. Naaman Plummer and her meds will run out on the 26th. She said she understands clinic policy and all, just wants to know what she should do.

## 2014-08-06 NOTE — Telephone Encounter (Signed)
Called patient again and let her know that we will not refill meds without appt.  Dr. Naaman Plummer has limited openings for next week and for her to call back ASAP

## 2014-08-06 NOTE — Telephone Encounter (Signed)
Called the pt back and got her voice mail, I informed the pt that she could be seen by Dr. Naaman Plummer prior to her medication running out. I asked her to call the clinic and reschedule as soon as possible

## 2014-08-10 ENCOUNTER — Telehealth: Payer: Self-pay | Admitting: *Deleted

## 2014-08-10 ENCOUNTER — Ambulatory Visit: Payer: Medicare Other | Admitting: Internal Medicine

## 2014-08-10 NOTE — Telephone Encounter (Signed)
Left message to call and make appt with [facility] for tomorrow 08/11/14

## 2014-08-10 NOTE — Telephone Encounter (Signed)
Pt called back apologizing that her phones were out, please call back to get her rescheduled

## 2014-08-11 DIAGNOSIS — I10 Essential (primary) hypertension: Secondary | ICD-10-CM | POA: Diagnosis not present

## 2014-08-11 DIAGNOSIS — A492 Hemophilus influenzae infection, unspecified site: Secondary | ICD-10-CM | POA: Diagnosis not present

## 2014-08-11 NOTE — Telephone Encounter (Signed)
Called patient again and explained that she needs to make an appt - that no RX will be given with out being seen by a provider.  Explained to call [phone] and hit 0 for the receptionist and schedule her appt.

## 2014-08-14 ENCOUNTER — Ambulatory Visit: Payer: Medicare Other | Admitting: Physical Medicine & Rehabilitation

## 2014-08-18 ENCOUNTER — Encounter: Payer: Medicare Other | Attending: Physical Medicine & Rehabilitation | Admitting: Physical Medicine & Rehabilitation

## 2014-08-18 ENCOUNTER — Encounter: Payer: Self-pay | Admitting: Physical Medicine & Rehabilitation

## 2014-08-18 VITALS — BP 140/78 | HR 66 | Resp 16

## 2014-08-18 DIAGNOSIS — M961 Postlaminectomy syndrome, not elsewhere classified: Secondary | ICD-10-CM | POA: Diagnosis present

## 2014-08-18 DIAGNOSIS — Z79899 Other long term (current) drug therapy: Secondary | ICD-10-CM | POA: Insufficient documentation

## 2014-08-18 DIAGNOSIS — Z5181 Encounter for therapeutic drug level monitoring: Secondary | ICD-10-CM | POA: Insufficient documentation

## 2014-08-18 DIAGNOSIS — G039 Meningitis, unspecified: Secondary | ICD-10-CM | POA: Insufficient documentation

## 2014-08-18 DIAGNOSIS — M5441 Lumbago with sciatica, right side: Secondary | ICD-10-CM | POA: Insufficient documentation

## 2014-08-18 MED ORDER — OXYMORPHONE HCL ER 40 MG PO TB12: 40.0000 mg | ORAL_TABLET | Freq: Two times a day (BID) | ORAL | Status: DC

## 2014-08-18 MED ORDER — OXYCODONE HCL 15 MG PO TABS: 15.0000 mg | ORAL_TABLET | Freq: Four times a day (QID) | ORAL | Status: DC | PRN

## 2014-08-18 MED ORDER — AMITRIPTYLINE HCL 100 MG PO TABS: 100.0000 mg | ORAL_TABLET | Freq: Every day | ORAL | Status: DC

## 2014-08-18 NOTE — Patient Instructions (Addendum)
PLEASE CALL ME WITH ANY PROBLEMS OR QUESTIONS (#[phone]).   CONTINUE TO WORK ON RANGE OF MOTION AND REGULAR EXERCISE!!!

## 2014-08-18 NOTE — Progress Notes (Signed)
Subjective:    Patient ID: [name] [name], female    DOB: Dec 26, 1950, 64 y.o.   MRN: [id number]  HPI   [name] is back regarding her chronic back pain. She is having ongoing pain in her legs. Her feet have been bothering her more.   She recently came down with a URI which has really knocked her backwards. Before that she's been trying to exercise and work on ROM.   She continues to follow up with Dr. Joya Salm for surgical consideration of her back. She has had some tingling and occasional numbness in both of her arms and sometimes the hands.     Pain Inventory Average Pain 9 Pain Right Now 9 My pain is sharp, burning, stabbing, tingling and aching  In the last 24 hours, has pain interfered with the following? General activity 10 Relation with others 9 Enjoyment of life 10 What TIME of day is your pain at its worst? all Sleep (in general) Fair  Pain is worse with: walking, sitting, inactivity, standing and some activites Pain improves with: rest, heat/ice and medication Relief from Meds: 7  Mobility use a cane how many minutes can you walk? 10 ability to climb steps?  yes do you drive?  yes  Function Do you have any goals in this area?  no  Neuro/Psych weakness numbness tremor tingling trouble walking spasms  Prior Studies Any changes since last visit?  yes  Has had either flu (tx for this) or poss pneumonia  Physicians involved in your care Any changes since last visit?  no   Family History  Problem Relation Age of Onset  . Hypertension Mother   . Heart disease Mother   . Diabetes Mother   . Cancer Father   . Diabetes Brother    History   Social History  . Marital Status: Married    Spouse Name: N/A  . Number of Children: N/A  . Years of Education: N/A   Occupational History  . disable    Social History Main Topics  . Smoking status: Never Smoker   . Smokeless tobacco: Never Used  . Alcohol Use: No  . Drug Use: No  . Sexual Activity: Not  on file     Comment: second hand smoke   Other Topics Concern  . None   Social History Narrative   Has one child   Disabled   Past Surgical History  Procedure Laterality Date  . Lumbar fusion  11-30-10    L4 - 5  . Exploration of incision for csf leak  DEC 2011  X2    POST LAMINECOTMY  . Lumbar laminectomy  DEC 2011    L4 - 5  . Lumbar fusion  2000    L2 - 4  . Cervical disc surgery  2005    C5 - 6  . Anterior fusion cervical spine  2007    C5 -6  . Tendon repair  JAN 2010    LEFT INDEX AND LONG FINGERS  . Left wrist tenosynectomy w/ left thumb joint repair  02-24-10  . Abdominal hysterectomy  1987    W/ BSO  . Cholecystectomy  1994  . Knee arthroscopy  LEFT X3 (LAST ONE 2005)  . Carpal tunnel release  RIGHT - 2001  & DEC 2011 W/ BACK SURG.  . Knee arthroscopy  05/17/2011    Procedure: ARTHROSCOPY KNEE;  Surgeon: Bradley Ferris III;  Location: [place];  Service: Orthopedics;  Laterality: Right;  WITH  MEDIAL MENISECTOMY AND removal of suprapatella fat lump  . Anterior cervical decomp/discectomy fusion N/A 08/09/2012    Procedure: ANTERIOR CERVICAL DECOMPRESSION/DISCECTOMY FUSION 1 LEVEL;  Surgeon: Floyce Stakes, MD;  Location: [facility];  Service: Neurosurgery;  Laterality: N/A;  Cervical four-five  Anterior cervical decompression/diskectomy/fusion  . Finger arthrodesis Right 04/07/2013    Procedure: RIGHT INDEX AND RIGHT LONG DISTAL INTERPHALANGEAL JOINT FUSIONS;  Surgeon: Schuyler Amor, MD;  Location: [place];  Service: Orthopedics;  Laterality: Right;  . Finger arthroplasty Right 04/07/2013    Procedure: RIGHT THUMB Morse ARTHROPLASTY;  Surgeon: Schuyler Amor, MD;  Location: [place];  Service: Orthopedics;  Laterality: Right;  . Dorsal compartment release Right 04/07/2013    Procedure: RIGHT WRIST STS RELEASE;  Surgeon: Schuyler Amor, MD;  Location: [place];  Service: Orthopedics;   Laterality: Right;  . Ganglion cyst excision Right 04/07/2013    Procedure: RIGHT WRIST MASS EXCISION;  Surgeon: Schuyler Amor, MD;  Location: [place];  Service: Orthopedics;  Laterality: Right;   Past Medical History  Diagnosis Date  . Osteoporosis   . Varicosities     venous  . Cardiomyopathy HX --06/2010    EF was 25% during acute illness (PHELONEPHRITIS) Repeat echo 12-06-10 60% showed normal EF.   Marland Kitchen Hypertension   . Headache(784.0)   . PONV (postoperative nausea and vomiting)   . Heart murmur     DENIES S & S   (ECHO JUN'12 W/ CHART)  . History of chronic bronchitis   . Chronic back pain greater than 3 months duration     S/P BACK SURG'S  . Arachnoiditis BILATERAL LEGS    DUE TO MULTIPLE BACK SURG.'S  . Weakness of both legs DUE TO ARACHNOIDITIS    OCCASIONAL USES CANE  . Blood transfusion   . GERD (gastroesophageal reflux disease) AND HIATIAL HERNIA    CONTROLLED W/ NEXIUM  . Acute sinusitis, unspecified   . Anxiety state, unspecified   . Essential hypertension, benign   . Dyslipidemia   . Other malaise and fatigue   . Murmur, heart   . Shortness of breath   . Neuromuscular disorder     numbness and tingling  . Arthritis   . Hx of bladder infections   . Dysphagia     some post op cerv fusion 2/14  . Asthma    BP 140/78 mmHg  Pulse 66  Resp 16  SpO2 99%  Opioid Risk Score:   Fall Risk Score: High Fall Risk (>13 points) (previously educated and given handout)  Review of Systems  Constitutional: Positive for fever, chills and appetite change.  Respiratory: Positive for cough, shortness of breath and wheezing.        ? pneumonia  Gastrointestinal: Positive for nausea, abdominal pain and diarrhea.  Musculoskeletal: Positive for gait problem.       Spasms  Neurological: Positive for tremors, weakness and numbness.       Tingling  All other systems reviewed and are negative.      Objective:   Physical Exam  Constitutional: She is  oriented to person, place, and time. She appears well-developed and well-nourished.  HENT: hoarse voice Head: Normocephalic and atraumatic.  Eyes: Conjunctivae and EOM are normal. Pupils are equal, round, and reactive to light.  Neck: Normal range of motion. Neck supple.  Cardiovascular: Normal rate and regular rhythm.  Pulmonary/Chest: Effort normal and breath sounds normal.  Abdominal: Soft. Bowel  sounds are normal. She exhibits no distension.  Musculoskeletal: tenderness with palpation of both greater trochs Cervical back: She exhibits decreased range of motion and tenderness.  Lumbar back: She exhibits decreased range of motion, tenderness, bony tenderness and spasm. She can bend at the waist to approximately 45 degrees before she begins to hurt. Neurological: She is alert and oriented to person, place, and time. No cranial nerve deficit.  She has some subjective, mild weakness in the upper extremities but strength grossly 4+/5 in UE's. She has more weakness in LE's and especially with ADF and APF bilaterally. Still there are no consistent or focal sensory findings are seen in the arms. Lower extremities continues to show some mild sensory loss at 1-1+ out of 2, particularly below the knees. Inconsistent weakness in the legs with pain inhibition noted once again today.. Reflexes are generally trace to 1+ throughout both legs. UE DTR's are 2+. Gait is wide based and perhaps a little antalgic on the right more than left. She is using a cane for support.  Psychiatric: She has a normal mood and affect. Her behavior is normal. Judgment and thought content normal. She is pleasant as always  Assessment & Plan:   ASSESSMENT:  1. 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 She has documented C6 radiculitis in the past.  2. Post lumbar post laminectomy syndrome.  3. 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 Again, I don't see progression of symptoms/disease on exam.  4. Anxiety/depression secondary to the above    PLAN:  1. Continue gabapentin 600mg  TID for nerve. She does not want to increase.  2. Pt does not wish to pursue a spinal stimulator. NS plan otherwise per Dr. Joya Salm.  3. Continue with Opana ER to 40mg  q12 #60  4. Will continue oxy IR 15mg  for breakthrough. q6 hour prn #120  5. Still can consider caudal block to help with symptoms. She will think about this moving forward.  6. Maintain elavil at 100mg  qhs --refilled today.  7. Flexeril and valium prn for spasms  8. The patient will see NP in one month. All questions were encouraged and answered.

## 2014-08-19 ENCOUNTER — Ambulatory Visit (INDEPENDENT_AMBULATORY_CARE_PROVIDER_SITE_OTHER): Payer: Medicare Other | Admitting: Internal Medicine

## 2014-08-19 ENCOUNTER — Encounter: Payer: Self-pay | Admitting: Internal Medicine

## 2014-08-19 VITALS — BP 128/88 | HR 84 | Ht 63.0 in | Wt 186.0 lb

## 2014-08-19 DIAGNOSIS — R490 Dysphonia: Secondary | ICD-10-CM | POA: Diagnosis not present

## 2014-08-19 DIAGNOSIS — R911 Solitary pulmonary nodule: Secondary | ICD-10-CM | POA: Diagnosis not present

## 2014-08-19 DIAGNOSIS — R059 Cough, unspecified: Secondary | ICD-10-CM

## 2014-08-19 DIAGNOSIS — R05 Cough: Secondary | ICD-10-CM | POA: Diagnosis not present

## 2014-08-19 DIAGNOSIS — R110 Nausea: Secondary | ICD-10-CM | POA: Diagnosis not present

## 2014-08-19 DIAGNOSIS — J4530 Mild persistent asthma, uncomplicated: Secondary | ICD-10-CM | POA: Diagnosis not present

## 2014-08-19 MED ORDER — BUDESONIDE-FORMOTEROL FUMARATE 80-4.5 MCG/ACT IN AERO: 2.0000 | INHALATION_SPRAY | Freq: Two times a day (BID) | RESPIRATORY_TRACT | Status: DC

## 2014-08-19 MED ORDER — ONDANSETRON HCL 8 MG PO TABS
8.0000 mg | ORAL_TABLET | ORAL | Status: DC | PRN
Start: 2014-08-19 — End: 2014-09-16

## 2014-08-19 NOTE — Progress Notes (Signed)
Subjective:    Patient ID: [name] [name], female    DOB: 1951/04/22, 64 y.o.   MRN: [id number]  HPI   HPI  PCP is HASANAJ,XAJE A, MD Cards is dr Vita Barley  Body mass index is 26.83 kg/(m^2). Passive smoker x 17 years   IOV 04/22/2012 64 year old female. Referred for dyspnea. Wondering if she has passive smoking related lung disease (husband and son)  Dyspnea: In 2011 December was critically ill following spine surgery, had UTI and low EF but since then has recovered from critical illness. Says she has been dyspneic even before this critical illness for total 3-4 years but since past 1 year more noticeable dyspnea. Sometimes dyspneic walking from room to room. Sometimes dyspneic talking on phone. Triggers include cig smoke  Of husband ("husband's cig smoke is killing me and they think I am crazy") and is associated with cough at that time. Dyspnea is slowly progressive. Severity is mild-moderate. No orthopnea. No paroxysmal nocturnal dyspnea but has woken up wheezing at night since sept 2013 (was exposed to perfume at doctor office and dx acute bronchitis and given abx). THere is associated cough but only episodic.  There is no edema, hemoptysis, unintenional weight loss.   Walking desaturation test on 04/23/2012 185 feet x 3 laps:  did NOT desaturate. Rest pulse ox was 97%, final pulse ox was 94%. HR response 84/min at rest to 86/min at peak exertion.   RElevant hx   Fused L2-L5 x 3 spine surgery due to disc disease, osteoporosis, and also fall, various reasons  LABS - cxr sept 2012: clear lung fields - hgb nov 2012: 11.7gm%  - cardiac 04/01/12: Normal stress test with normal EF - no hx of PFTs  REC #Shortness of breath  - our check list says latex allergy is cautionary for flu shot; do not know why but we will defer flu shot today  - CMA/RN will walk you for oxygen levels now 04/22/2012  - please have full PFT breathing test at [place] or [place] or [place] at your  convenience; soon after doing it call us at [phone] and pass message on so I can review and advise next step      Telephone calls Dec - FEb 2014 PFT 05/27/12 is normal ecwpt mild reduction in diffusion (74%) and 8% BD response. These could be freatures of early emphysema, asthma, or scarring in lungs or normal variant. To better understand this - do HRCT chest. IF this is normal, needs methacholine challenge test for asthma and if that too is normal needs bike pulmonary stress test. She is aware of this algorithm. I will stay in touch by phone. For now, get CT chest (ordered)  CT chest does not show reason for shortness of breath. Next to do is methacholine challenge test; ordered. She is do and give [place] a call so I can review it. She know the algorithm. CT does show 63mm lung nodules -very very very rare < 1:1000 chance it is serious like cancer. She needs repeat ct chest in 1 year without cotnrast (ordered)  Methacholine challenge test on 08/07/2012 is positive for asthma. PD 20 at less than 4.0. No flow volume loop issues   OV 09/24/2012  Followup 3 mm lung nodule and Followup for dyspnea on the basis of positive methacholine challenge test   At the end of last visit she had diagnostic workup. She has been diagnosed with 3 mm lung nodule and also positive methacholine challenge test.  For the past few weeks she is taking Symbicort 2 puff twice daily. This is helping with her symptoms. She is absolutely petrified about developing lung cancer. She is upset that her husband and son continued to smoke and had a house and this is an etiological agent for lung cancer. She is also worried that this will make her asthma worse. I have agreed with her sentiments. There are no new issues   Past, Family, Social reviewed: no change since last visit other than the fact she had disc fusion surgery by Dr. Joya Salm   OV 07/01/2013  Chief Complaint  Patient presents with  . Follow-up    to review CT results. Pt  states she has been using xopenex at least once daily and also has albuterol neb that she is using 2-3 times a week.     FU  - asthma ( reports that she has never smoked. She has never used smokeless tobacco. But PASSIVE SMOKER +)  - lung nodule   In terms of asthma  - Noiv 2014 had AE-asthma due to virus. Rx by PCP. She is upset that hsuband and son do not listen to her and contiue to expose her to smoke and places hjer at risk for Ae-asthma. I have offered to counsel them at a future visit. She is out of symbicort but is compliant. She prefers xopenex mdi and can afford it. Wants scripts for both. Uptodate with flu shot  In terms of nodule  - stable 3 mm nodules at base Jan 2014 through Jan 2015. She is worried about cancer risk due to passive smoking and prefers followup Scans.     OV 03/13/2014  Chief Complaint  Patient presents with  . Follow-up    Pt states the summer months were causing her difficulty breathing. Pt states she has cough in evening and at night with intermittent mucus production, clear in color. Pt c/o DOE and chest tightness with activity.     FU asthma (on basis of positive methacholine challenge test)   - Says this summer 2015 has been a bit roiugh for her due to humidity; lot of down days with chest tightness and wheezing and dyspnea but never any ae-asthma till 2 weeks ago and then given Clarithromycin per hx and 7d prednisone. After this 75% better but still feels not at baseline. Just feels tired and some wheeze and some chest tightness. Currently no fever or chills;.Spuutum is small volume and white  - In addition a month ago when well some family members noted that she was blue on lips and fingers but she was asymptomatic then. Today walk test 185 feet x 3 laps: did not desaturate but did have dyspnea  04/13/2014 Follow up (Asthma-MCT positive )  She returns for 1 month follow up  Was seen last ov with Asthma flare , given steroid taper.  Does feel  better but still has intermittent wheezing.  Feels her  breathing is slightly improved but still having PND, hoarseness and increased DOE with wheezing on/off.  Started Zyrtec with some help.  Decreased cough . Wheezing not as bad, mostly at night or with increased activity. No chest pain.  Drainage is worst symptom.  Spirometry today shows nml lung volumes   FEV1 80%, ratio 74, and FVC 80%.  Denies chest pain, orthopnea, edema or hemoptysis .  >>AR tx regimen   06/18/2014 Acute OV  Complains of sinus pressure, sinus drainage, cough, congestion , wheezing and nasal stuffiness for 1 week.  coughing up thick mucus and blowing out thick mucus. Using her nasal spray and zyrtec without any relief.   she is taking Mucinex without much relief  She denies any chest pain, orthopnea, PND or leg swelling  rec augmentin pred burst  OV 08/19/2014  Chief Complaint  Patient presents with  . Follow-up    Pt c/o recent acute illness. Pt c/o prod cough with clear and yellow mucus, hoarseness, general malaise, mid back pain, fatigue x 3 weeks. Pt stated she was given tamiflu by PCP without any relief.      Follow-up asthma patient. Asthma on basis of positive methacholine challenge test.  Seen end of the summer 2015 for asthma exacerbation and given Augmentin and prednisone. After that was feeling well. Now 3 weeks ago started having nausea associated with diarrhea that is ongoing and persistent and moderate in intensity. Then some 10 weeks ago started developing hoarse voice, chest tightness, wheezing and worsening cough associated with lethargy maybe a runny nose. Was given Tamiflu but says this did not help. Has tried Tessalon cough perles without much relief. Since then the runny nose is resolved but having all the other symptoms and feels miserable along with intermittent unexplained dizziness.  She did note some years ago saw ENT ? [facility] - advised of "vocal cord damage" due to prior intubations  relatd to surgery  She reports being compliant with his Symbicort  Spirometry today baseline NORMAL  She is wanting some tablets for nausea    has a past medical history of Osteoporosis; Varicosities; Cardiomyopathy (HX --06/2010); Hypertension; Headache(784.0); PONV (postoperative nausea and vomiting); Heart murmur; History of chronic bronchitis; Chronic back pain greater than 3 months duration; Arachnoiditis (BILATERAL LEGS); Weakness of both legs (DUE TO ARACHNOIDITIS); Blood transfusion; GERD (gastroesophageal reflux disease) (AND HIATIAL HERNIA); Acute sinusitis, unspecified; Anxiety state, unspecified; Essential hypertension, benign; Dyslipidemia; Other malaise and fatigue; Murmur, heart; Shortness of breath; Neuromuscular disorder; Arthritis; bladder infections; Dysphagia; and Asthma.   reports that she has never smoked. She has never used smokeless tobacco.  Past Surgical History  Procedure Laterality Date  . Lumbar fusion  11-30-10    L4 - 5  . Exploration of incision for csf leak  DEC 2011  X2    POST LAMINECOTMY  . Lumbar laminectomy  DEC 2011    L4 - 5  . Lumbar fusion  2000    L2 - 4  . Cervical disc surgery  2005    C5 - 6  . Anterior fusion cervical spine  2007    C5 -6  . Tendon repair  JAN 2010    LEFT INDEX AND LONG FINGERS  . Left wrist tenosynectomy w/ left thumb joint repair  02-24-10  . Abdominal hysterectomy  1987    W/ BSO  . Cholecystectomy  1994  . Knee arthroscopy  LEFT X3 (LAST ONE 2005)  . Carpal tunnel release  RIGHT - 2001  & DEC 2011 W/ BACK SURG.  . Knee arthroscopy  05/17/2011    Procedure: ARTHROSCOPY KNEE;  Surgeon: Bradley Ferris III;  Location: [place];  Service: Orthopedics;  Laterality: Right;  WITH MEDIAL MENISECTOMY AND removal of suprapatella fat lump  . Anterior cervical decomp/discectomy fusion N/A 08/09/2012    Procedure: ANTERIOR CERVICAL DECOMPRESSION/DISCECTOMY FUSION 1 LEVEL;  Surgeon: Floyce Stakes, MD;   Location: [facility];  Service: Neurosurgery;  Laterality: N/A;  Cervical four-five  Anterior cervical decompression/diskectomy/fusion  . Finger arthrodesis Right 04/07/2013  Procedure: RIGHT INDEX AND RIGHT LONG DISTAL INTERPHALANGEAL JOINT FUSIONS;  Surgeon: Schuyler Amor, MD;  Location: [place];  Service: Orthopedics;  Laterality: Right;  . Finger arthroplasty Right 04/07/2013    Procedure: RIGHT THUMB [place] ARTHROPLASTY;  Surgeon: Schuyler Amor, MD;  Location: [place];  Service: Orthopedics;  Laterality: Right;  . Dorsal compartment release Right 04/07/2013    Procedure: RIGHT WRIST STS RELEASE;  Surgeon: Schuyler Amor, MD;  Location: [place];  Service: Orthopedics;  Laterality: Right;  . Ganglion cyst excision Right 04/07/2013    Procedure: RIGHT WRIST MASS EXCISION;  Surgeon: Schuyler Amor, MD;  Location: [place];  Service: Orthopedics;  Laterality: Right;    Allergies  Allergen Reactions  . Latex Itching and Rash  . Morphine And Related Hives and Itching  . Aspirin Nausea And Vomiting    Can take coated asa  . Biaxin [Clarithromycin] Nausea And Vomiting  . Clarithromycin Diarrhea  . Codeine Itching  . Darvocet [Propoxyphene N-Acetaminophen] Itching  . Lortab [Hydrocodone-Acetaminophen] Itching  . Percocet [Oxycodone-Acetaminophen] Itching    Immunization History  Administered Date(s) Administered  . Influenza Split 04/19/2013, 03/18/2014  . Pneumococcal Polysaccharide-23 04/19/2013  . Zoster 05/30/2013    Family History  Problem Relation Age of Onset  . Hypertension Mother   . Heart disease Mother   . Diabetes Mother   . Cancer Father   . Diabetes Brother      Current outpatient prescriptions:  .  amitriptyline (ELAVIL) 100 MG tablet, Take 1 tablet (100 mg total) by mouth at bedtime., Disp: 30 tablet, Rfl: 4 .  aspirin EC 81 MG tablet, Take 81 mg by mouth See admin  instructions. Every other day., Disp: , Rfl:  .  atenolol-chlorthalidone (TENORETIC) 50-25 MG per tablet, Take 1 tablet by mouth daily., Disp: , Rfl:  .  azelastine (ASTELIN) 0.1 % nasal spray, Place 2 sprays into both nostrils daily. Use in each nostril as directed, Disp: 30 mL, Rfl: 12 .  Biotin 10 MG CAPS, Take 10 mg by mouth daily., Disp: , Rfl:  .  budesonide-formoterol (SYMBICORT) 80-4.5 MCG/ACT inhaler, Inhale 2 puffs into the lungs 2 (two) times daily., Disp: 1 Inhaler, Rfl: 6 .  Calcium Carbonate-Vit D-Min 1200-1000 MG-UNIT CHEW, Chew 1 tablet by mouth 2 (two) times daily. , Disp: , Rfl:  .  cyclobenzaprine (FLEXERIL) 10 MG tablet, Take 10 mg by mouth 2 (two) times daily as needed for muscle spasms. , Disp: , Rfl:  .  diazepam (VALIUM) 10 MG tablet, Take 10 mg by mouth 3 (three) times daily as needed (muscle spasms). , Disp: , Rfl:  .  esomeprazole (NEXIUM) 40 MG capsule, Take 40 mg by mouth every morning. , Disp: , Rfl:  .  fluticasone (FLONASE) 50 MCG/ACT nasal spray, Place 2 sprays into both nostrils daily., Disp: 16 g, Rfl: 2 .  gabapentin (NEURONTIN) 600 MG tablet, Take 1 tablet (600 mg total) by mouth 3 (three) times daily., Disp: 90 tablet, Rfl: 2 .  ibandronate (BONIVA) 150 MG tablet, Take 150 mg by mouth every 30 (thirty) days. Take in the morning with a full glass of water, on an empty stomach, and do not take anything else by mouth or lie down for the next 30 min., Disp: , Rfl:  .  levalbuterol (XOPENEX HFA) 45 MCG/ACT inhaler, Inhale 1-2 puffs into the lungs every 4 (four) hours as needed. For shortness of breath, Disp: 1 Inhaler,  Rfl: 6 .  oxyCODONE (ROXICODONE) 15 MG immediate release tablet, Take 1 tablet (15 mg total) by mouth every 6 (six) hours as needed. For pain, Disp: 120 tablet, Rfl: 0 .  oxymorphone (OPANA ER) 40 MG 12 hr tablet, Take 1 tablet (40 mg total) by mouth every 12 (twelve) hours., Disp: 60 tablet, Rfl: 0 .  valACYclovir (VALTREX) 500 MG tablet, Take 1,000 mg  by mouth 2 (two) times daily. , Disp: , Rfl:  .  venlafaxine XR (EFFEXOR-XR) 75 MG 24 hr capsule, Take 75 mg by mouth daily with breakfast., Disp: , Rfl:  .  vitamin B-12 (CYANOCOBALAMIN) 1000 MCG tablet, Take 1,000 mcg by mouth daily. , Disp: , Rfl:  No current facility-administered medications for this visit.  Facility-Administered Medications Ordered in Other Visits:  .  levalbuterol (XOPENEX) nebulizer solution 0.63 mg, 0.63 mg, Nebulization, Once, Tammy S Parrett, NP     Review of Systems  Constitutional: Positive for fatigue. Negative for fever and unexpected weight change.  HENT: Negative for congestion, dental problem, ear pain, nosebleeds, postnasal drip, rhinorrhea, sinus pressure, sneezing, sore throat and trouble swallowing.   Eyes: Negative for redness and itching.  Respiratory: Positive for cough and shortness of breath. Negative for chest tightness and wheezing.   Cardiovascular: Negative for palpitations and leg swelling.  Gastrointestinal: Negative for nausea and vomiting.  Genitourinary: Negative for dysuria.  Musculoskeletal: Negative for joint swelling.  Skin: Negative for rash.  Neurological: Negative for headaches.  Hematological: Does not bruise/bleed easily.  Psychiatric/Behavioral: Negative for dysphoric mood. The patient is not nervous/anxious.        Objective:   Physical Exam  Constitutional: She is oriented to person, place, and time. She appears well-developed and well-nourished. No distress.  Body mass index is 32.96 kg/(m^2). Looks deconditioned  HENT:  Head: Normocephalic and atraumatic.  Right Ear: External ear normal.  Left Ear: External ear normal.  Mouth/Throat: Oropharynx is clear and moist. No oropharyngeal exudate.  Voice is hoarse but no thrush  Eyes: Conjunctivae and EOM are normal. Pupils are equal, round, and reactive to light. Right eye exhibits no discharge. Left eye exhibits no discharge. No scleral icterus.  Neck: Normal range of  motion. Neck supple. No JVD present. No tracheal deviation present. No thyromegaly present.  Cardiovascular: Normal rate, regular rhythm, normal heart sounds and intact distal pulses.  Exam reveals no gallop and no friction rub.   No murmur heard. Pulmonary/Chest: Effort normal and breath sounds normal. No respiratory distress. She has no wheezes. She has no rales. She exhibits no tenderness.  Abdominal: Soft. Bowel sounds are normal. She exhibits no distension and no mass. There is no tenderness. There is no rebound and no guarding.  Musculoskeletal: Normal range of motion. She exhibits no edema or tenderness.  Lymphadenopathy:    She has no cervical adenopathy.  Neurological: She is alert and oriented to person, place, and time. She has normal reflexes. No cranial nerve deficit. She exhibits normal muscle tone. Coordination normal.  Skin: Skin is warm and dry. No rash noted. She is not diaphoretic. No erythema. No pallor.  Psychiatric: She has a normal mood and affect. Her behavior is normal. Judgment and thought content normal.  Vitals reviewed.   Filed Vitals:   08/19/14 1619  BP: 128/88  Pulse: 84  Height: 5\' 3"  (1.6 m)  Weight: 186 lb (84.369 kg)  SpO2: 97%         Assessment & Plan:     ICD-9-CM ICD-10-CM  1. Cough 786.2 R05 Spirometry with Graph  2. Lung nodule 793.11 R91.1   3. Mild persistent asthma, uncomplicated 798.92 J19.41   4. Hoarse voice quality 784.42 R49.0   5. Nausea without vomiting 787.02 R11.0     #lung nodule  -= small 81mm and stable Jan 2014 - Jan 2015  - given your concern for lung cancer, next CT chest wo contrast June 2016 (old order should be there)  #Asthma   Currently stble Continue symbicort 2 puff twice daily - CMA to ensure refill Use albuterol 2 puff prn  #Hoarseness of voice  - refer [facility]  #Nausea - zofran 8mg  x 4 tablets we will do -address with PCP Neale Burly, MD    #FOllowup June 2016 afte CT CHest   Dr.  Brand Males, M.D., [facility].C.P Pulmonary and Critical Care Medicine Staff Physician [place] [facility] Pager: [phone], If no answer or between  15:00h - 7:00h: call [phone]  08/19/2014 5:14 PM

## 2014-08-19 NOTE — Addendum Note (Signed)
Addended by: Mathis Dad on: 08/19/2014 05:22 PM   Modules accepted: Orders, Medications

## 2014-08-19 NOTE — Patient Instructions (Addendum)
    ICD-9-CM ICD-10-CM   1. Cough 786.2 R05 Spirometry with Graph  2. Lung nodule 793.11 R91.1   3. Mild persistent asthma, uncomplicated 935.70 V77.93   4. Hoarse voice quality 784.42 R49.0   5. Nausea without vomiting 787.02 R11.0      #lung nodule  -= small 64mm and stable Jan 2014 - Jan 2015  - given your concern for lung cancer, next CT chest wo contrast June 2016 (old order should be there)  #Asthma   Currently stble Continue symbicort 2 puff twice daily - CMA to ensure refill Use albuterol 2 puff prn  #Hoarseness of voice  - refer [facility]  #Nausea - zofran 8mg  x 4 tablets we will do -address with PCP Neale Burly, MD    #FOllowup June 2016 afte CT CHest

## 2014-09-03 DIAGNOSIS — R490 Dysphonia: Secondary | ICD-10-CM | POA: Diagnosis not present

## 2014-09-14 DIAGNOSIS — R110 Nausea: Secondary | ICD-10-CM | POA: Diagnosis not present

## 2014-09-14 DIAGNOSIS — I10 Essential (primary) hypertension: Secondary | ICD-10-CM | POA: Diagnosis not present

## 2014-09-14 DIAGNOSIS — E11649 Type 2 diabetes mellitus with hypoglycemia without coma: Secondary | ICD-10-CM | POA: Diagnosis not present

## 2014-09-16 ENCOUNTER — Encounter: Payer: Self-pay | Admitting: Registered Nurse

## 2014-09-16 ENCOUNTER — Other Ambulatory Visit: Payer: Self-pay | Admitting: Registered Nurse

## 2014-09-16 ENCOUNTER — Encounter (HOSPITAL_BASED_OUTPATIENT_CLINIC_OR_DEPARTMENT_OTHER): Payer: Medicare Other | Admitting: Registered Nurse

## 2014-09-16 ENCOUNTER — Telehealth: Payer: Self-pay | Admitting: Registered Nurse

## 2014-09-16 VITALS — BP 131/75 | HR 71 | Resp 14

## 2014-09-16 DIAGNOSIS — M5412 Radiculopathy, cervical region: Secondary | ICD-10-CM

## 2014-09-16 DIAGNOSIS — M5441 Lumbago with sciatica, right side: Secondary | ICD-10-CM | POA: Diagnosis not present

## 2014-09-16 DIAGNOSIS — G039 Meningitis, unspecified: Secondary | ICD-10-CM

## 2014-09-16 DIAGNOSIS — Z79899 Other long term (current) drug therapy: Secondary | ICD-10-CM | POA: Diagnosis not present

## 2014-09-16 DIAGNOSIS — M961 Postlaminectomy syndrome, not elsewhere classified: Secondary | ICD-10-CM | POA: Diagnosis not present

## 2014-09-16 DIAGNOSIS — Z5181 Encounter for therapeutic drug level monitoring: Secondary | ICD-10-CM

## 2014-09-16 MED ORDER — OXYCODONE HCL 15 MG PO TABS: 15.0000 mg | ORAL_TABLET | Freq: Four times a day (QID) | ORAL | Status: DC | PRN

## 2014-09-16 MED ORDER — GABAPENTIN 600 MG PO TABS: 600.0000 mg | ORAL_TABLET | Freq: Three times a day (TID) | ORAL | Status: DC

## 2014-09-16 MED ORDER — OXYMORPHONE HCL ER 40 MG PO TB12: 40.0000 mg | ORAL_TABLET | Freq: Two times a day (BID) | ORAL | Status: DC

## 2014-09-16 MED ORDER — OXYMORPHONE HCL ER 40 MG PO TB12
40.0000 mg | ORAL_TABLET | Freq: Two times a day (BID) | ORAL | Status: DC
Start: 2014-09-16 — End: 2014-11-11

## 2014-09-16 NOTE — Progress Notes (Signed)
Subjective:    Patient ID: [name] [name], female    DOB: 1950/09/04, 64 y.o.   MRN: [id number]  HPI: [name] is a 64 year old female who returns for follow up for chronic pain and medication refill. She says her pain is located in her neck, right arm and shoulder, and lower back.Also states she has been experiencing radiating pain from her neck into right shoulder and extending to elbow. She has been instructed to follow up with Dr. Joya Salm she verbalizes understanding. She rates her pain 8. Her current exercise regime is walking.  She will be helping her niece with babysitting for a month at [facility] she was given second script to accommodate she verbalizes understanding.  Pain Inventory Average Pain 8 Pain Right Now 8 My pain is sharp, burning, stabbing, tingling and aching  In the last 24 hours, has pain interfered with the following? General activity 9 Relation with others 10 Enjoyment of life 9 What TIME of day is your pain at its worst? all Sleep (in general) Fair  Pain is worse with: walking, sitting, standing and some activites Pain improves with: rest, heat/ice and medication Relief from Meds: 7  Mobility walk with assistance use a cane how many minutes can you walk? 10 ability to climb steps?  yes do you drive?  yes  Function disabled: date disabled 2001  Neuro/Psych No problems in this area  Prior Studies Any changes since last visit?  no  Physicians involved in your care Any changes since last visit?  no   Family History  Problem Relation Age of Onset  . Hypertension Mother   . Heart disease Mother   . Diabetes Mother   . Cancer Father   . Diabetes Brother    History   Social History  . Marital Status: Married    Spouse Name: N/A  . Number of Children: N/A  . Years of Education: N/A   Occupational History  . disable    Social History Main Topics  . Smoking status: Never Smoker   . Smokeless tobacco: Never Used  . Alcohol  Use: No  . Drug Use: No  . Sexual Activity: Not on file     Comment: second hand smoke   Other Topics Concern  . None   Social History Narrative   Has one child   Disabled   Past Surgical History  Procedure Laterality Date  . Lumbar fusion  11-30-10    L4 - 5  . Exploration of incision for csf leak  DEC 2011  X2    POST LAMINECOTMY  . Lumbar laminectomy  DEC 2011    L4 - 5  . Lumbar fusion  2000    L2 - 4  . Cervical disc surgery  2005    C5 - 6  . Anterior fusion cervical spine  2007    C5 -6  . Tendon repair  JAN 2010    LEFT INDEX AND LONG FINGERS  . Left wrist tenosynectomy w/ left thumb joint repair  02-24-10  . Abdominal hysterectomy  1987    W/ BSO  . Cholecystectomy  1994  . Knee arthroscopy  LEFT X3 (LAST ONE 2005)  . Carpal tunnel release  RIGHT - 2001  & DEC 2011 W/ BACK SURG.  . Knee arthroscopy  05/17/2011    Procedure: ARTHROSCOPY KNEE;  Surgeon: Bradley Ferris III;  Location: [place];  Service: Orthopedics;  Laterality: Right;  WITH MEDIAL  MENISECTOMY AND removal of suprapatella fat lump  . Anterior cervical decomp/discectomy fusion N/A 08/09/2012    Procedure: ANTERIOR CERVICAL DECOMPRESSION/DISCECTOMY FUSION 1 LEVEL;  Surgeon: Floyce Stakes, MD;  Location: [facility];  Service: Neurosurgery;  Laterality: N/A;  Cervical four-five  Anterior cervical decompression/diskectomy/fusion  . Finger arthrodesis Right 04/07/2013    Procedure: RIGHT INDEX AND RIGHT LONG DISTAL INTERPHALANGEAL JOINT FUSIONS;  Surgeon: Schuyler Amor, MD;  Location: [place];  Service: Orthopedics;  Laterality: Right;  . Finger arthroplasty Right 04/07/2013    Procedure: RIGHT THUMB [place] ARTHROPLASTY;  Surgeon: Schuyler Amor, MD;  Location: [place];  Service: Orthopedics;  Laterality: Right;  . Dorsal compartment release Right 04/07/2013    Procedure: RIGHT WRIST STS RELEASE;  Surgeon: Schuyler Amor, MD;  Location:  [place];  Service: Orthopedics;  Laterality: Right;  . Ganglion cyst excision Right 04/07/2013    Procedure: RIGHT WRIST MASS EXCISION;  Surgeon: Schuyler Amor, MD;  Location: [place];  Service: Orthopedics;  Laterality: Right;   Past Medical History  Diagnosis Date  . Osteoporosis   . Varicosities     venous  . Cardiomyopathy HX --06/2010    EF was 25% during acute illness (PHELONEPHRITIS) Repeat echo 12-06-10 60% showed normal EF.   Marland Kitchen Hypertension   . Headache(784.0)   . PONV (postoperative nausea and vomiting)   . Heart murmur     DENIES S & S   (ECHO JUN'12 W/ CHART)  . History of chronic bronchitis   . Chronic back pain greater than 3 months duration     S/P BACK SURG'S  . Arachnoiditis BILATERAL LEGS    DUE TO MULTIPLE BACK SURG.'S  . Weakness of both legs DUE TO ARACHNOIDITIS    OCCASIONAL USES CANE  . Blood transfusion   . GERD (gastroesophageal reflux disease) AND HIATIAL HERNIA    CONTROLLED W/ NEXIUM  . Acute sinusitis, unspecified   . Anxiety state, unspecified   . Essential hypertension, benign   . Dyslipidemia   . Other malaise and fatigue   . Murmur, heart   . Shortness of breath   . Neuromuscular disorder     numbness and tingling  . Arthritis   . Hx of bladder infections   . Dysphagia     some post op cerv fusion 2/14  . Asthma    BP 131/75 mmHg  Pulse 71  Resp 14  SpO2 96%  Opioid Risk Score:   Fall Risk Score: High Fall Risk (>13 points)`1  Depression screen PHQ 2/9  Depression screen PHQ 2/9 09/16/2014  Decreased Interest 1  Down, Depressed, Hopeless 1  PHQ - 2 Score 2  Altered sleeping 2  Tired, decreased energy 2  Change in appetite 1  Feeling bad or failure about yourself  1  Trouble concentrating 2  Moving slowly or fidgety/restless 0  Suicidal thoughts 0  PHQ-9 Score 10    Review of Systems  Constitutional:       Weight loss  Gastrointestinal: Positive for nausea and abdominal pain.    Endocrine:       High blood sugar  All other systems reviewed and are negative.      Objective:   Physical Exam  Constitutional: She is oriented to person, place, and time. She appears well-developed and well-nourished.  HENT:  Head: Normocephalic and atraumatic.  Neck: Normal range of motion. Neck supple.  Cervical Paraspinal Tenderness: C-3- C-5  Cardiovascular: Normal rate and regular rhythm.   Pulmonary/Chest: Effort normal and breath sounds normal.  Musculoskeletal:  Normal Muscle Bulk and Muscle Testing Reveals: Upper Extremities: Decreased ROM 90 Degrees and Muscle Strength 4/5 Thoracic Paraspinal Tenderness: T-1- T-3 Right AC Joint Tenderness Right Deltoid Muscle tenderness Lumbar Paraspinal Tenderness: L-3- L-5 Lower Extremities: Full ROM and Muscle Strength 5/5 Right Lower Extremity Flexion: Produces pain into Lumbar Arises from chair with ease/ Using three prong cane for support   Neurological: She is alert and oriented to person, place, and time.  Skin: Skin is warm and dry.  Psychiatric: She has a normal mood and affect.  Nursing note and vitals reviewed.         Assessment & Plan:  1. 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  Refilled: Oxycodone 15mg  one tablet every 6 hours as needed #120 and Opana 40 mg one tablet every 12 hours  # 60 . Second script given. 2. 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 Dr. Joya Salm Following. Continue Gabapentin 600mg  TID. Encourage Posture and Stretching Exercise.  3. Anxiety/depression: Continue Valium and monitor.  4. Muscle Spasms: Continue Flexeril  5. Neck Pain/ Radiculopath: F/U with Dr. Joya Salm 20 minutes of face to face patient care time was spent during this visit. All questions were encouraged and answered.   F/U in 2 month

## 2014-09-16 NOTE — Telephone Encounter (Signed)
Gabapentin ordered

## 2014-09-17 LAB — PMP ALCOHOL METABOLITE (ETG): Ethyl Glucuronide (EtG): NEGATIVE ng/mL

## 2014-09-18 ENCOUNTER — Ambulatory Visit: Payer: Medicare Other | Admitting: Registered Nurse

## 2014-09-20 LAB — BENZODIAZEPINES (GC/LC/MS), URINE
Alprazolam metabolite (GC/LC/MS), ur confirm: NEGATIVE ng/mL (ref <25)
Clonazepam metabolite (GC/LC/MS), ur confirm: NEGATIVE ng/mL (ref <25)
Flurazepam metabolite (GC/LC/MS), ur confirm: NEGATIVE ng/mL (ref <50)
LORAZEPAMU: NEGATIVE ng/mL (ref <50)
Midazolam (GC/LC/MS), ur confirm: NEGATIVE ng/mL (ref <50)
Nordiazepam (GC/LC/MS), ur confirm: 104 ng/mL — AB (ref <50)
Oxazepam (GC/LC/MS), ur confirm: 297 ng/mL — AB (ref <50)
TEMAZEPAMU: 117 ng/mL — AB (ref <50)
Triazolam metabolite (GC/LC/MS), ur confirm: NEGATIVE ng/mL (ref <50)

## 2014-09-20 LAB — OXYCODONE, URINE (LC/MS-MS)
NOROXYCODONE, UR: 969 ng/mL (ref <50)
Oxycodone, ur: 1012 ng/mL (ref <50)
Oxymorphone: 9055 ng/mL (ref <50)

## 2014-09-20 LAB — OPIATES/OPIOIDS (LC/MS-MS)
Codeine Urine: NEGATIVE ng/mL (ref <50)
HYDROCODONE: NEGATIVE ng/mL (ref <50)
Hydromorphone: NEGATIVE ng/mL (ref <50)
Morphine Urine: NEGATIVE ng/mL (ref <50)
NORHYDROCODONE, UR: NEGATIVE ng/mL (ref <50)
NOROXYCODONE, UR: 969 ng/mL (ref <50)
Oxycodone, ur: 1012 ng/mL (ref <50)
Oxymorphone: 9055 ng/mL (ref <50)

## 2014-09-22 LAB — PRESCRIPTION MONITORING PROFILE (SOLSTAS)
Amphetamine/Meth: NEGATIVE ng/mL
Barbiturate Screen, Urine: NEGATIVE ng/mL
Buprenorphine, Urine: NEGATIVE ng/mL
COCAINE METABOLITES: NEGATIVE ng/mL
Cannabinoid Scrn, Ur: NEGATIVE ng/mL
Carisoprodol, Urine: NEGATIVE ng/mL
Creatinine, Urine: 42.86 mg/dL (ref >20.0)
Fentanyl, Ur: NEGATIVE ng/mL
MDMA URINE: NEGATIVE ng/mL
Meperidine, Ur: NEGATIVE ng/mL
Methadone Screen, Urine: NEGATIVE ng/mL
NITRITES URINE, INITIAL: NEGATIVE ug/mL
PH URINE, INITIAL: 7 pH (ref 4.5–8.9)
PROPOXYPHENE: NEGATIVE ng/mL
TRAMADOL UR: NEGATIVE ng/mL
Tapentadol, urine: NEGATIVE ng/mL
Zolpidem, Urine: NEGATIVE ng/mL

## 2014-09-30 NOTE — Progress Notes (Signed)
Urine drug screen for this encounter is consistent for prescribed medication 

## 2014-10-13 ENCOUNTER — Encounter: Payer: Self-pay | Admitting: Registered Nurse

## 2014-10-13 ENCOUNTER — Telehealth: Payer: Self-pay | Admitting: *Deleted

## 2014-10-13 NOTE — Telephone Encounter (Signed)
[name] [name] called and was concerned because she received her urine drug screen and did not understand the numbers.  I reassured her that it was just reporting levels detected in her system and she is ok.  If a drug is not reported to [facility] and is detected, valium we verify she has been prescribed it but it will report abnormal on the test.  She wanted to make sure she hadn't done something wrong.

## 2014-10-29 DIAGNOSIS — M542 Cervicalgia: Secondary | ICD-10-CM | POA: Diagnosis not present

## 2014-10-29 DIAGNOSIS — Z6831 Body mass index (BMI) 31.0-31.9, adult: Secondary | ICD-10-CM | POA: Diagnosis not present

## 2014-11-10 ENCOUNTER — Other Ambulatory Visit: Payer: Self-pay | Admitting: Internal Medicine

## 2014-11-10 DIAGNOSIS — E11649 Type 2 diabetes mellitus with hypoglycemia without coma: Secondary | ICD-10-CM | POA: Diagnosis not present

## 2014-11-10 DIAGNOSIS — I10 Essential (primary) hypertension: Secondary | ICD-10-CM | POA: Diagnosis not present

## 2014-11-10 DIAGNOSIS — M19041 Primary osteoarthritis, right hand: Secondary | ICD-10-CM | POA: Diagnosis not present

## 2014-11-10 DIAGNOSIS — R911 Solitary pulmonary nodule: Secondary | ICD-10-CM

## 2014-11-10 DIAGNOSIS — M67811 Other specified disorders of synovium, right shoulder: Secondary | ICD-10-CM | POA: Diagnosis not present

## 2014-11-11 ENCOUNTER — Encounter: Payer: Self-pay | Admitting: Registered Nurse

## 2014-11-11 ENCOUNTER — Ambulatory Visit: Payer: Medicare Other | Admitting: Internal Medicine

## 2014-11-11 ENCOUNTER — Encounter: Payer: Medicare Other | Attending: Physical Medicine & Rehabilitation | Admitting: Registered Nurse

## 2014-11-11 VITALS — BP 152/82 | HR 76 | Resp 14

## 2014-11-11 DIAGNOSIS — Z5181 Encounter for therapeutic drug level monitoring: Secondary | ICD-10-CM | POA: Diagnosis not present

## 2014-11-11 DIAGNOSIS — M5412 Radiculopathy, cervical region: Secondary | ICD-10-CM | POA: Diagnosis not present

## 2014-11-11 DIAGNOSIS — M5441 Lumbago with sciatica, right side: Secondary | ICD-10-CM | POA: Diagnosis not present

## 2014-11-11 DIAGNOSIS — Z79899 Other long term (current) drug therapy: Secondary | ICD-10-CM | POA: Diagnosis not present

## 2014-11-11 DIAGNOSIS — G039 Meningitis, unspecified: Secondary | ICD-10-CM | POA: Insufficient documentation

## 2014-11-11 DIAGNOSIS — M961 Postlaminectomy syndrome, not elsewhere classified: Secondary | ICD-10-CM

## 2014-11-11 MED ORDER — OXYCODONE HCL 15 MG PO TABS: 15.0000 mg | ORAL_TABLET | Freq: Four times a day (QID) | ORAL | Status: DC | PRN

## 2014-11-11 MED ORDER — OXYMORPHONE HCL ER 40 MG PO TB12: 40.0000 mg | ORAL_TABLET | Freq: Two times a day (BID) | ORAL | Status: DC

## 2014-11-11 MED ORDER — OXYMORPHONE HCL ER 40 MG PO TB12
40.0000 mg | ORAL_TABLET | Freq: Two times a day (BID) | ORAL | Status: DC
Start: 2014-11-11 — End: 2015-01-06

## 2014-11-11 NOTE — Progress Notes (Signed)
Subjective:    Patient ID: [name] [name], female    DOB: 09/27/1950, 64 y.o.   MRN: [id number]  HPI: [name] [name] is a 64 year old female who returns for follow up for chronic pain and medication refill. She says her pain is located in her neck radiating into her right arm extending to elbow, bilateral shoulder's, and lower back. She rates her pain 8. Her current exercise regime is walking, performing stretching exercises. She's hoping to start pool therapy once weather stabilizes.  She had a follow up appointment with Dr. Joya Salm on 10/29/14, he referred her to Dr. Burney Gauze orthopedist. She received a right shoulder cortisone injection by Dr. Burney Gauze yesterday 11/10/2014. Awaiting relief and follow up appointment's with Dr. Joya Salm and Dr. Burney Gauze.  Also states "she had two falls this month on 10/21/2014 she was on her deck and lost her balance and fell downstairs she was able to get up didn't seek medical attention. On 10/27/14 she walking in her home talking on the phone right leg became weak and she fell on her knees  And hit her head she states. She didn't seek medical attention. She has followed up with Dr. Joya Salm she states.  Pain Inventory Average Pain 8 Pain Right Now 8 My pain is constant, sharp, burning, stabbing, tingling and aching  In the last 24 hours, has pain interfered with the following? General activity 8 Relation with others 7 Enjoyment of life 8 What TIME of day is your pain at its worst? ALL Sleep (in general) Fair  Pain is worse with: walking, sitting, standing and some activites Pain improves with: rest, heat/ice and medication Relief from Meds: 8  Mobility walk without assistance how many minutes can you walk? 5-15 ability to climb steps?  yes do you drive?  yes  Function disabled: date disabled .  Neuro/Psych depression anxiety  Prior Studies Any changes since last visit?  no  Physicians involved in your care Any changes since last visit?   no   Family History  Problem Relation Age of Onset  . Hypertension Mother   . Heart disease Mother   . Diabetes Mother   . Cancer Father   . Diabetes Brother    History   Social History  . Marital Status: Married    Spouse Name: N/A  . Number of Children: N/A  . Years of Education: N/A   Occupational History  . disable    Social History Main Topics  . Smoking status: Never Smoker   . Smokeless tobacco: Never Used  . Alcohol Use: No  . Drug Use: No  . Sexual Activity: Not on file     Comment: second hand smoke   Other Topics Concern  . None   Social History Narrative   Has one child   Disabled   Past Surgical History  Procedure Laterality Date  . Lumbar fusion  11-30-10    L4 - 5  . Exploration of incision for csf leak  DEC 2011  X2    POST LAMINECOTMY  . Lumbar laminectomy  DEC 2011    L4 - 5  . Lumbar fusion  2000    L2 - 4  . Cervical disc surgery  2005    C5 - 6  . Anterior fusion cervical spine  2007    C5 -6  . Tendon repair  JAN 2010    LEFT INDEX AND LONG FINGERS  . Left wrist tenosynectomy w/ left thumb joint repair  02-24-10  . Abdominal hysterectomy  1987    W/ BSO  . Cholecystectomy  1994  . Knee arthroscopy  LEFT X3 (LAST ONE 2005)  . Carpal tunnel release  RIGHT - 2001  & DEC 2011 W/ BACK SURG.  . Knee arthroscopy  05/17/2011    Procedure: ARTHROSCOPY KNEE;  Surgeon: Bradley Ferris III;  Location: [place];  Service: Orthopedics;  Laterality: Right;  WITH MEDIAL MENISECTOMY AND removal of suprapatella fat lump  . Anterior cervical decomp/discectomy fusion N/A 08/09/2012    Procedure: ANTERIOR CERVICAL DECOMPRESSION/DISCECTOMY FUSION 1 LEVEL;  Surgeon: Floyce Stakes, MD;  Location: [facility];  Service: Neurosurgery;  Laterality: N/A;  Cervical four-five  Anterior cervical decompression/diskectomy/fusion  . Finger arthrodesis Right 04/07/2013    Procedure: RIGHT INDEX AND RIGHT LONG DISTAL INTERPHALANGEAL JOINT FUSIONS;   Surgeon: Schuyler Amor, MD;  Location: [place];  Service: Orthopedics;  Laterality: Right;  . Finger arthroplasty Right 04/07/2013    Procedure: RIGHT THUMB [place] ARTHROPLASTY;  Surgeon: Schuyler Amor, MD;  Location: [place];  Service: Orthopedics;  Laterality: Right;  . Dorsal compartment release Right 04/07/2013    Procedure: RIGHT WRIST STS RELEASE;  Surgeon: Schuyler Amor, MD;  Location: [place];  Service: Orthopedics;  Laterality: Right;  . Ganglion cyst excision Right 04/07/2013    Procedure: RIGHT WRIST MASS EXCISION;  Surgeon: Schuyler Amor, MD;  Location: [place];  Service: Orthopedics;  Laterality: Right;   Past Medical History  Diagnosis Date  . Osteoporosis   . Varicosities     venous  . Cardiomyopathy HX --06/2010    EF was 25% during acute illness (PHELONEPHRITIS) Repeat echo 12-06-10 60% showed normal EF.   Marland Kitchen Hypertension   . Headache(784.0)   . PONV (postoperative nausea and vomiting)   . Heart murmur     DENIES S & S   (ECHO JUN'12 W/ CHART)  . History of chronic bronchitis   . Chronic back pain greater than 3 months duration     S/P BACK SURG'S  . Arachnoiditis BILATERAL LEGS    DUE TO MULTIPLE BACK SURG.'S  . Weakness of both legs DUE TO ARACHNOIDITIS    OCCASIONAL USES CANE  . Blood transfusion   . GERD (gastroesophageal reflux disease) AND HIATIAL HERNIA    CONTROLLED W/ NEXIUM  . Acute sinusitis, unspecified   . Anxiety state, unspecified   . Essential hypertension, benign   . Dyslipidemia   . Other malaise and fatigue   . Murmur, heart   . Shortness of breath   . Neuromuscular disorder     numbness and tingling  . Arthritis   . Hx of bladder infections   . Dysphagia     some post op cerv fusion 2/14  . Asthma    BP 152/82 mmHg  Pulse 76  Resp 14  SpO2 99%  Opioid Risk Score:   Fall Risk Score: Moderate Fall Risk (6-13 points)`1  Depression screen PHQ  2/9  Depression screen PHQ 2/9 09/16/2014  Decreased Interest 1  Down, Depressed, Hopeless 1  PHQ - 2 Score 2  Altered sleeping 2  Tired, decreased energy 2  Change in appetite 1  Feeling bad or failure about yourself  1  Trouble concentrating 2  Moving slowly or fidgety/restless 0  Suicidal thoughts 0  PHQ-9 Score 10     Review of Systems  Constitutional: Negative.  High/low blood sugar  HENT: Negative.   Eyes: Negative.   Respiratory: Negative.   Cardiovascular: Positive for leg swelling.  Gastrointestinal: Positive for nausea and abdominal pain.  Endocrine: Negative.   Genitourinary: Negative.   Musculoskeletal: Positive for myalgias, back pain, arthralgias and neck pain.  Skin: Negative.   Allergic/Immunologic: Negative.   Neurological: Negative.   Hematological: Negative.   Psychiatric/Behavioral: Positive for dysphoric mood. The patient is nervous/anxious.        Objective:   Physical Exam  Constitutional: She is oriented to person, place, and time. She appears well-developed and well-nourished.  HENT:  Head: Normocephalic and atraumatic.  Neck: Normal range of motion. Neck supple.  Cervical Paraspinal Tenderness: C-3- C-5  Cardiovascular: Normal rate and regular rhythm.   Pulmonary/Chest: Effort normal and breath sounds normal.  Musculoskeletal:  Normal Muscle Bulk and Muscle Testing Reveals: Upper Extremities: Full ROM and Muscle Strength Right 4/5 and Left 5/5. Thoracic Hypersensitivity Lumbar Paraspinal Tenderness: L-3- L-5 Lower Extremities: Full ROM and Muscle Strength 5/5 Arises from chair with ease Narrow based Gait  Neurological: She is alert and oriented to person, place, and time.  Skin: Skin is warm.  Psychiatric: She has a normal mood and affect.  Nursing note and vitals reviewed.         Assessment & Plan:  1. 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  Refilled: Oxycodone 15mg  one tablet  every 6 hours as needed #120 and Opana 40 mg one tablet every 12 hours  # 60 . Second script given. 2. 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 Dr. Joya Salm Following. Continue Gabapentin 600mg  TID. Encourage Posture and Stretching Exercise.  3. Anxiety/depression: Continue Valium and monitor.  4. Muscle Spasms: Continue Flexeril  5. Neck Pain/ Radiculopath: F/U with Dr. Joya Salm 20 minutes of face to face patient care time was spent during this visit. All questions were encouraged and answered.   F/U in 2 month

## 2014-11-26 ENCOUNTER — Other Ambulatory Visit: Payer: Self-pay | Admitting: Emergency Medicine

## 2014-11-26 MED ORDER — FLUTICASONE PROPIONATE 50 MCG/ACT NA SUSP: 2.0000 | Freq: Every day | NASAL | Status: DC

## 2014-12-08 ENCOUNTER — Ambulatory Visit (INDEPENDENT_AMBULATORY_CARE_PROVIDER_SITE_OTHER)
Admission: RE | Admit: 2014-12-08 | Discharge: 2014-12-08 | Disposition: A | Discharge status: Home / Self Care | Payer: Medicare Other | Source: Ambulatory Visit | Attending: Internal Medicine | Admitting: Internal Medicine

## 2014-12-08 DIAGNOSIS — M67811 Other specified disorders of synovium, right shoulder: Secondary | ICD-10-CM | POA: Diagnosis not present

## 2014-12-08 DIAGNOSIS — R911 Solitary pulmonary nodule: Secondary | ICD-10-CM | POA: Diagnosis not present

## 2014-12-08 DIAGNOSIS — R918 Other nonspecific abnormal finding of lung field: Secondary | ICD-10-CM | POA: Diagnosis not present

## 2014-12-17 NOTE — Progress Notes (Signed)
Quick Note:  Called and spoke to pt. Informed her of the results and recs per MR. Pt requesting scan be sent to her PCP. CT sent electronically to PCP. Pt verbalized understanding and denied any further questions or concerns at this time.   ______

## 2014-12-18 DIAGNOSIS — M25511 Pain in right shoulder: Secondary | ICD-10-CM | POA: Diagnosis not present

## 2014-12-24 ENCOUNTER — Encounter: Payer: Self-pay | Admitting: Internal Medicine

## 2014-12-24 ENCOUNTER — Ambulatory Visit (INDEPENDENT_AMBULATORY_CARE_PROVIDER_SITE_OTHER): Payer: Medicare Other | Admitting: Internal Medicine

## 2014-12-24 ENCOUNTER — Other Ambulatory Visit: Payer: Medicare Other

## 2014-12-24 VITALS — BP 110/82 | HR 72 | Ht 64.0 in | Wt 180.0 lb

## 2014-12-24 DIAGNOSIS — J4530 Mild persistent asthma, uncomplicated: Secondary | ICD-10-CM | POA: Diagnosis not present

## 2014-12-24 DIAGNOSIS — R911 Solitary pulmonary nodule: Secondary | ICD-10-CM

## 2014-12-24 DIAGNOSIS — M67811 Other specified disorders of synovium, right shoulder: Secondary | ICD-10-CM | POA: Diagnosis not present

## 2014-12-24 DIAGNOSIS — J45909 Unspecified asthma, uncomplicated: Secondary | ICD-10-CM | POA: Diagnosis not present

## 2014-12-24 LAB — CBC WITH DIFFERENTIAL/PLATELET
BASOS PCT: 0 % (ref 0–1)
Basophils Absolute: 0 10*3/uL (ref 0.0–0.1)
Eosinophils Absolute: 0.1 10*3/uL (ref 0.0–0.7)
Eosinophils Relative: 1 % (ref 0–5)
HEMATOCRIT: 38.1 % (ref 36.0–46.0)
HEMOGLOBIN: 13 g/dL (ref 12.0–15.0)
LYMPHS ABS: 2.7 10*3/uL (ref 0.7–4.0)
Lymphocytes Relative: 37 % (ref 12–46)
MCH: 32.5 pg (ref 26.0–34.0)
MCHC: 34.1 g/dL (ref 30.0–36.0)
MCV: 95.3 fL (ref 78.0–100.0)
MONO ABS: 0.6 10*3/uL (ref 0.1–1.0)
MONOS PCT: 8 % (ref 3–12)
MPV: 9.2 fL (ref 8.6–12.4)
NEUTROS ABS: 3.9 10*3/uL (ref 1.7–7.7)
Neutrophils Relative %: 54 % (ref 43–77)
Platelets: 216 10*3/uL (ref 150–400)
RBC: 4 MIL/uL (ref 3.87–5.11)
RDW: 13.2 % (ref 11.5–15.5)
WBC: 7.3 10*3/uL (ref 4.0–10.5)

## 2014-12-24 LAB — NITRIC OXIDE: Nitric Oxide: 27

## 2014-12-24 NOTE — Patient Instructions (Addendum)
#  lung nodule  -= small 24mm and stable Jan 2014 - Jan 2015 AND  June 2016  -no more CT chest  #Asthma   - Currently  Somewhat active because you ran out of symbicort - restart symbicort 80/4.5 2 puff twice daily  - Use albuyterol prn -  There might be allergy component to explain out of proportion symptoms - Do CBC with Diff, IgE, and Blood Allergy Panel - will call with results to discuss next step Continue symbicort 2 puff twice daily - CMA to ensure refill Use albuterol 2 puff prn     #FOllowup - will call with lab work ; might need allergy eval  -return in 6 months - ACQ and FeNO at followup

## 2014-12-24 NOTE — Progress Notes (Signed)
Subjective:    Patient ID: [name] [name], female    DOB: Apr 22, 1951, 63 y.o.   MRN: [id number]  HPI    PCP is HASANAJ,XAJE A, MD Cards is dr Vita Barley  Body mass index is 26.83 kg/(m^2). Passive smoker x 17 years   IOV 04/22/2012 64 year old female. Referred for dyspnea. Wondering if she has passive smoking related lung disease (husband and son)  Dyspnea: In 2011 December was critically ill following spine surgery, had UTI and low EF but since then has recovered from critical illness. Says she has been dyspneic even before this critical illness for total 3-4 years but since past 1 year more noticeable dyspnea. Sometimes dyspneic walking from room to room. Sometimes dyspneic talking on phone. Triggers include cig smoke  Of husband ("husband's cig smoke is killing me and they think I am crazy") and is associated with cough at that time. Dyspnea is slowly progressive. Severity is mild-moderate. No orthopnea. No paroxysmal nocturnal dyspnea but has woken up wheezing at night since sept 2013 (was exposed to perfume at doctor office and dx acute bronchitis and given abx). THere is associated cough but only episodic.  There is no edema, hemoptysis, unintenional weight loss.   Walking desaturation test on 04/23/2012 185 feet x 3 laps:  did NOT desaturate. Rest pulse ox was 97%, final pulse ox was 94%. HR response 84/min at rest to 86/min at peak exertion.   RElevant hx   Fused L2-L5 x 3 spine surgery due to disc disease, osteoporosis, and also fall, various reasons  LABS - cxr sept 2012: clear lung fields - hgb nov 2012: 11.7gm%  - cardiac 04/01/12: Normal stress test with normal EF - no hx of PFTs  REC #Shortness of breath  - our check list says latex allergy is cautionary for flu shot; do not know why but we will defer flu shot today  - CMA/RN will walk you for oxygen levels now 04/22/2012  - please have full PFT breathing test at [place] or [place] or [place] at your convenience;  soon after doing it call us at [phone] and pass message on so I can review and advise next step      Telephone calls Dec - FEb 2014 PFT 05/27/12 is normal ecwpt mild reduction in diffusion (74%) and 8% BD response. These could be freatures of early emphysema, asthma, or scarring in lungs or normal variant. To better understand this - do HRCT chest. IF this is normal, needs methacholine challenge test for asthma and if that too is normal needs bike pulmonary stress test. She is aware of this algorithm. I will stay in touch by phone. For now, get CT chest (ordered)  CT chest does not show reason for shortness of breath. Next to do is methacholine challenge test; ordered. She is do and give [place] a call so I can review it. She know the algorithm. CT does show 8mm lung nodules -very very very rare < 1:1000 chance it is serious like cancer. She needs repeat ct chest in 1 year without cotnrast (ordered)  Methacholine challenge test on 08/07/2012 is positive for asthma. PD 20 at less than 4.0. No flow volume loop issues   OV 09/24/2012  Followup 3 mm lung nodule and Followup for dyspnea on the basis of positive methacholine challenge test   At the end of last visit she had diagnostic workup. She has been diagnosed with 3 mm lung nodule and also positive methacholine challenge test. For  the past few weeks she is taking Symbicort 2 puff twice daily. This is helping with her symptoms. She is absolutely petrified about developing lung cancer. She is upset that her husband and son continued to smoke and had a house and this is an etiological agent for lung cancer. She is also worried that this will make her asthma worse. I have agreed with her sentiments. There are no new issues   Past, Family, Social reviewed: no change since last visit other than the fact she had disc fusion surgery by Dr. Joya Salm   OV 07/01/2013  Chief Complaint  Patient presents with  . Follow-up    to review CT results. Pt states she  has been using xopenex at least once daily and also has albuterol neb that she is using 2-3 times a week.     FU  - asthma ( reports that she has never smoked. She has never used smokeless tobacco. But PASSIVE SMOKER +)  - lung nodule   In terms of asthma  - Noiv 2014 had AE-asthma due to virus. Rx by PCP. She is upset that hsuband and son do not listen to her and contiue to expose her to smoke and places hjer at risk for Ae-asthma. I have offered to counsel them at a future visit. She is out of symbicort but is compliant. She prefers xopenex mdi and can afford it. Wants scripts for both. Uptodate with flu shot  In terms of nodule  - stable 3 mm nodules at base Jan 2014 through Jan 2015. She is worried about cancer risk due to passive smoking and prefers followup Scans.     OV 03/13/2014  Chief Complaint  Patient presents with  . Follow-up    Pt states the summer months were causing her difficulty breathing. Pt states she has cough in evening and at night with intermittent mucus production, clear in color. Pt c/o DOE and chest tightness with activity.     FU asthma (on basis of positive methacholine challenge test)   - Says this summer 2015 has been a bit roiugh for her due to humidity; lot of down days with chest tightness and wheezing and dyspnea but never any ae-asthma till 2 weeks ago and then given Clarithromycin per hx and 7d prednisone. After this 75% better but still feels not at baseline. Just feels tired and some wheeze and some chest tightness. Currently no fever or chills;.Spuutum is small volume and white  - In addition a month ago when well some family members noted that she was blue on lips and fingers but she was asymptomatic then. Today walk test 185 feet x 3 laps: did not desaturate but did have dyspnea  04/13/2014 Follow up (Asthma-MCT positive )  She returns for 1 month follow up  Was seen last ov with Asthma flare , given steroid taper.  Does feel better but still  has intermittent wheezing.  Feels her  breathing is slightly improved but still having PND, hoarseness and increased DOE with wheezing on/off.  Started Zyrtec with some help.  Decreased cough . Wheezing not as bad, mostly at night or with increased activity. No chest pain.  Drainage is worst symptom.  Spirometry today shows nml lung volumes   FEV1 80%, ratio 74, and FVC 80%.  Denies chest pain, orthopnea, edema or hemoptysis .  >>AR tx regimen   06/18/2014 Acute OV  Complains of sinus pressure, sinus drainage, cough, congestion , wheezing and nasal stuffiness for 1 week.  coughing up thick mucus and blowing out thick mucus. Using her nasal spray and zyrtec without any relief.   she is taking Mucinex without much relief  She denies any chest pain, orthopnea, PND or leg swelling  rec augmentin pred burst  OV 08/19/2014  Chief Complaint  Patient presents with  . Follow-up    Pt c/o recent acute illness. Pt c/o prod cough with clear and yellow mucus, hoarseness, general malaise, mid back pain, fatigue x 3 weeks. Pt stated she was given tamiflu by PCP without any relief.      Follow-up asthma patient. Asthma on basis of positive methacholine challenge test.  Seen end of the summer 2015 for asthma exacerbation and given Augmentin and prednisone. After that was feeling well. Now 3 weeks ago started having nausea associated with diarrhea that is ongoing and persistent and moderate in intensity. Then some 10 weeks ago started developing hoarse voice, chest tightness, wheezing and worsening cough associated with lethargy maybe a runny nose. Was given Tamiflu but says this did not help. Has tried Tessalon cough perles without much relief. Since then the runny nose is resolved but having all the other symptoms and feels miserable along with intermittent unexplained dizziness.  She did note some years ago saw ENT ? 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 Follow-up    f/u CT scan; hoarse; prod cough w/yellow/clear mucus at times; SOB sometimes;   Follow-up asthma Follow-up lung nodules  Follow-up asthma patient. Asthma on basis of positive methacholine challenge test. She is also chronic opiate user. For chronic pain  - In terms of asthma overall stable but she has noticed that in the year 2016 that her "allergies" in the environment has been bothering her and causing her more cough and scratchy throat and occasional wheeziness than baseline. The Symbicort does help but not as well as it did in 2015. In addition she ran out of Symbicort a few weeks ago but she's not sure if this has made her symptoms worse. Asthma control questionnaire 5. scale averages 2 showing active symptoms. Specifically she says that she wakes up a few times at night because of asthma. When she wakes up in the morning she reports mild symptoms because of asthma and she reports being slightly limited with activities because of asthma. In addition she reports a little shortness of breath because of asthma and is wheezing a little of the time but is using albuterol rescue at least between 5 and 8 times a day.   - Lab review from the past shows normal eosinophil counts in 2008 in 2012. She's never had allergy testing but she does have cats. She is open to blood allergy testing today   - Exhaled nitric oxide today is 27 parts per billion and slightly elevated above normal    In terms of lung nodule  - These are micronodules. She is a nonsmoker and very low risk for cancer. She is only supposed to have a scan of the chest in January 2017 but she was very anxious and requested this can be done in June 2016 which we did. I personally visualized the image. These nodules are stable. There  is some incidental findings of hepatic steatosis T6 deformity and  I aortic atherosclerosis. I shared these with her and have asked her to talk to her primary care physician about these Neale Burly, MD        reports that she has never smoked. She has never used smokeless tobacco.  CT chest 12/08/14 -  IMPRESSION: 1. Similar appearance of tiny bibasilar pulmonary nodules, benign. 2. Hepatic steatosis. 3. Mild chronic T6 compression deformity. Prior vertebral augmentation at L1. 4. Aortic atherosclerosis Electronically Signed  By: Abigail Miyamoto M.D.  On: 12/08/2014 14:56    Current outpatient prescriptions:  .  amitriptyline (ELAVIL) 100 MG tablet, Take 1 tablet (100 mg total) by mouth at bedtime., Disp: 30 tablet, Rfl: 4 .  aspirin EC 81 MG tablet, Take 81 mg by mouth See admin instructions. Every other day., Disp: , Rfl:  .  atenolol-chlorthalidone (TENORETIC) 50-25 MG per tablet, Take 1 tablet by mouth daily., Disp: , Rfl:  .  azelastine (ASTELIN) 0.1 % nasal spray, Place 2 sprays into both nostrils daily. Use in each nostril as directed, Disp: 30 mL, Rfl: 12 .  benazepril (LOTENSIN) 5 MG tablet, Take 5 mg by mouth daily., Disp: , Rfl:  .  Biotin 10 MG CAPS, Take 10 mg by mouth daily., Disp: , Rfl:  .  budesonide-formoterol (SYMBICORT) 80-4.5 MCG/ACT inhaler, Inhale 2 puffs into the lungs 2 (two) times daily., Disp: 1 Inhaler, Rfl: 6 .  Calcium Carbonate-Vit D-Min 1200-1000 MG-UNIT CHEW, Chew 1 tablet by mouth 2 (two) times daily. , Disp: , Rfl:  .  cyclobenzaprine (FLEXERIL) 10 MG tablet, Take 10 mg by mouth 2 (two) times daily as needed for muscle spasms. , Disp: , Rfl:  .  diazepam (VALIUM) 10 MG tablet, Take 10 mg by mouth 3 (three) times daily as needed (muscle spasms). , Disp: , Rfl:  .  esomeprazole (NEXIUM) 40 MG capsule, Take 40 mg by mouth every morning. , Disp: , Rfl:  .  fluticasone (FLONASE) 50 MCG/ACT nasal spray, Place 2 sprays into both nostrils daily., Disp:  16 g, Rfl: 2 .  gabapentin (NEURONTIN) 600 MG tablet, Take 1 tablet (600 mg total) by mouth 3 (three) times daily., Disp: 90 tablet, Rfl: 2 .  ibandronate (BONIVA) 150 MG tablet, Take 150 mg by mouth every 30 (thirty) days. Take in the morning with a full glass of water, on an empty stomach, and do not take anything else by mouth or lie down for the next 30 min., Disp: , Rfl:  .  levalbuterol (XOPENEX HFA) 45 MCG/ACT inhaler, Inhale 1-2 puffs into the lungs every 4 (four) hours as needed. For shortness of breath, Disp: 1 Inhaler, Rfl: 6 .  oxyCODONE (ROXICODONE) 15 MG immediate release tablet, Take 1 tablet (15 mg total) by mouth every 6 (six) hours as needed. For pain, Disp: 120 tablet, Rfl: 0 .  oxymorphone (OPANA ER) 40 MG 12 hr tablet, Take 1 tablet (40 mg total) by mouth every 12 (twelve) hours., Disp: 60 tablet, Rfl: 0 .  valACYclovir (VALTREX) 500 MG tablet, Take 1,000 mg by mouth 2 (two) times daily. , Disp: , Rfl:  .  vitamin B-12 (CYANOCOBALAMIN) 1000 MCG tablet, Take 1,000 mcg by mouth daily. , Disp: , Rfl:  No current facility-administered medications for this visit.  Facility-Administered Medications Ordered in Other Visits:  .  levalbuterol (XOPENEX) nebulizer solution 0.63 mg, 0.63 mg, Nebulization, Once, Tammy S Parrett, NP   Review of Systems  Constitutional: Negative for fever and unexpected weight change.  HENT: Negative for  congestion, dental problem, ear pain, nosebleeds, postnasal drip, rhinorrhea, sinus pressure, sneezing, sore throat and trouble swallowing.   Eyes: Negative for redness and itching.  Respiratory: Positive for cough and shortness of breath. Negative for chest tightness and wheezing.   Cardiovascular: Negative for palpitations and leg swelling.  Gastrointestinal: Negative for nausea and vomiting.  Genitourinary: Negative for dysuria.  Musculoskeletal: Negative for joint swelling.  Skin: Negative for rash.  Neurological: Negative for headaches.   Hematological: Does not bruise/bleed easily.  Psychiatric/Behavioral: Negative for dysphoric mood. The patient is not nervous/anxious.        Objective:   Physical Exam  Constitutional: She is oriented to person, place, and time. She appears well-developed and well-nourished. No distress.  FLAT AFFECT Body mass index is 30.88 kg/(m^2).   HENT:  Head: Normocephalic and atraumatic.  Right Ear: External ear normal.  Left Ear: External ear normal.  Mouth/Throat: Oropharynx is clear and moist. No oropharyngeal exudate.  MILD POST NASAL DRIP +  Eyes: Conjunctivae and EOM are normal. Pupils are equal, round, and reactive to light. Right eye exhibits no discharge. Left eye exhibits no discharge. No scleral icterus.  Neck: Normal range of motion. Neck supple. No JVD present. No tracheal deviation present. No thyromegaly present.  Cardiovascular: Normal rate, regular rhythm, normal heart sounds and intact distal pulses.  Exam reveals no gallop and no friction rub.   No murmur heard. Pulmonary/Chest: Effort normal and breath sounds normal. No respiratory distress. She has no wheezes. She has no rales. She exhibits no tenderness.  Abdominal: Soft. Bowel sounds are normal. She exhibits no distension and no mass. There is no tenderness. There is no rebound and no guarding.  Musculoskeletal: Normal range of motion. She exhibits no edema or tenderness.  Lymphadenopathy:    She has no cervical adenopathy.  Neurological: She is alert and oriented to person, place, and time. She has normal reflexes. No cranial nerve deficit. She exhibits normal muscle tone. Coordination normal.  Skin: Skin is warm and dry. No rash noted. She is not diaphoretic. No erythema. No pallor.  FOREARM TATOO +  Psychiatric: She has a normal mood and affect. Her behavior is normal. Judgment and thought content normal.  Vitals reviewed.   Filed Vitals:   12/24/14 1612  BP: 110/82  Pulse: 72  Height: 5\' 4"  (1.626 m)   Weight: 180 lb (81.647 kg)  SpO2: 95%         Assessment & Plan:     ICD-9-CM ICD-10-CM   1. Mild persistent asthma, uncomplicated 932.67 T24.58 Nitric oxide  2. Lung nodule 793.11 R91.1     #lung nodule  -= small 83mm and stable Jan 2014 - Jan 2015 AND  June 2016  -no more CT chest  #Asthma   - Currently  Somewhat active because you ran out of symbicort but you also have symptoms that are out of proportion to severity - restart symbicort 80/4.5 2 puff twice daily  - Use albuyterol prn -  There might be allergy component to explain out of proportion symptoms  - Do CBC with Diff, IgE, and Blood Allergy Panel - will call with results to discuss next step Use albuterol 2 puff prn     #FOllowup - will call with lab work ; might need allergy eval  -return in 6 months - ACQ and FeNO at followu   Dr. Brand Males, M.D., [facility].C.P Pulmonary and Critical Care Medicine Staff Physician [facility] Pager: [phone]  [phone], If no answer or between  15:00h - 7:00h: call [phone]  12/24/2014 4:54 PM

## 2014-12-25 LAB — ALLERGY FULL PROFILE
Allergen, D pternoyssinus,d7: <0.1 kU/L
Allergen,Goose feathers, e70: <0.1 kU/L
Alternaria Alternata: <0.1 kU/L
Aspergillus fumigatus, m3: <0.1 kU/L
Bahia Grass: <0.1 kU/L
Bermuda Grass: <0.1 kU/L
Box Elder IgE: <0.1 kU/L
Candida Albicans: <0.1 kU/L
Cat Dander: <0.1 kU/L
Common Ragweed: <0.1 kU/L
Curvularia lunata: <0.1 kU/L
D. farinae: <0.1 kU/L
Dog Dander: <0.1 kU/L
Elm IgE: <0.1 kU/L
Fescue: <0.1 kU/L
G005 Rye, Perennial: <0.1 kU/L
G009 Red Top: <0.1 kU/L
Goldenrod: <0.1 kU/L
Helminthosporium halodes: <0.1 kU/L
House Dust Hollister: <0.1 kU/L
IgE (Immunoglobulin E), Serum: <2 kU/L (ref <115)
Lamb's Quarters: <0.1 kU/L
Oak: <0.1 kU/L
Plantain: <0.1 kU/L
Stemphylium Botryosum: <0.1 kU/L
Sycamore Tree: <0.1 kU/L
Timothy Grass: <0.1 kU/L

## 2014-12-28 ENCOUNTER — Ambulatory Visit: Payer: Medicare Other | Admitting: Internal Medicine

## 2014-12-28 NOTE — Progress Notes (Signed)
Quick Note:  Called and spoke to pt. Informed her of the results per MR. Pt verbalized understanding and denied any further questions or concerns at this time.   ______ 

## 2015-01-01 ENCOUNTER — Telehealth: Payer: Self-pay | Admitting: Internal Medicine

## 2015-01-01 NOTE — Telephone Encounter (Signed)
Pt called back. She spoke to pharmacy & they told her she does have refills. Does not need a call back.

## 2015-01-01 NOTE — Telephone Encounter (Signed)
lmtcb x1 for pt. 

## 2015-01-06 ENCOUNTER — Encounter: Payer: Medicare Other | Attending: Physical Medicine & Rehabilitation | Admitting: Registered Nurse

## 2015-01-06 ENCOUNTER — Other Ambulatory Visit: Payer: Self-pay | Admitting: Registered Nurse

## 2015-01-06 ENCOUNTER — Encounter: Payer: Self-pay | Admitting: Registered Nurse

## 2015-01-06 VITALS — BP 102/55 | HR 76 | Resp 16

## 2015-01-06 DIAGNOSIS — M5441 Lumbago with sciatica, right side: Secondary | ICD-10-CM | POA: Diagnosis not present

## 2015-01-06 DIAGNOSIS — G039 Meningitis, unspecified: Secondary | ICD-10-CM

## 2015-01-06 DIAGNOSIS — Z5181 Encounter for therapeutic drug level monitoring: Secondary | ICD-10-CM | POA: Diagnosis not present

## 2015-01-06 DIAGNOSIS — Z79899 Other long term (current) drug therapy: Secondary | ICD-10-CM

## 2015-01-06 DIAGNOSIS — M961 Postlaminectomy syndrome, not elsewhere classified: Secondary | ICD-10-CM

## 2015-01-06 MED ORDER — OXYMORPHONE HCL ER 40 MG PO TB12: 40.0000 mg | ORAL_TABLET | Freq: Two times a day (BID) | ORAL | Status: DC

## 2015-01-06 MED ORDER — OXYCODONE HCL 15 MG PO TABS: 15.0000 mg | ORAL_TABLET | Freq: Four times a day (QID) | ORAL | Status: DC | PRN

## 2015-01-06 MED ORDER — OXYCODONE HCL 15 MG PO TABS
15.0000 mg | ORAL_TABLET | Freq: Four times a day (QID) | ORAL | Status: DC | PRN
Start: 2015-01-06 — End: 2015-01-06

## 2015-01-06 MED ORDER — OXYMORPHONE HCL ER 40 MG PO TB12
40.0000 mg | ORAL_TABLET | Freq: Two times a day (BID) | ORAL | Status: DC
Start: 2015-01-06 — End: 2015-03-08

## 2015-01-06 NOTE — Progress Notes (Signed)
Subjective:    Patient ID: [name] [name], female    DOB: [birth year], 64 y.o.   MRN: [id number]  HPI: [name] is a 64 year old female who returns for follow up for chronic pain and medication refill. She says her pain is located in her neck radiating into her  bilateral shoulder's, and mid-lower back radiating into right lower extremity posteriorly. She rates her pain 7. Her current exercise regime is walking, performing stretching exercises andpool therapy.  Also states she fell on June 23 rd, she was walking and her leg gave out  She fell backwards hit her head on her nieces knee. She didn't seek medical attention. She has a F/U appointment with Dr. Joya Salm 01/28/15.  Pain Inventory Average Pain 8 Pain Right Now 7 My pain is sharp, burning, stabbing, tingling and aching  In the last 24 hours, has pain interfered with the following? General activity 8 Relation with others 9 Enjoyment of life 8 What TIME of day is your pain at its worst? all Sleep (in general) Fair  Pain is worse with: walking, sitting, standing and some activites Pain improves with: heat/ice and medication Relief from Meds: 6  Mobility use a cane ability to climb steps?  yes do you drive?  yes  Function Do you have any goals in this area?  no  Neuro/Psych weakness numbness tingling spasms  Prior Studies Any changes since last visit?  no  Physicians involved in your care Any changes since last visit?  no   Family History  Problem Relation Age of Onset  . Hypertension Mother   . Heart disease Mother   . Diabetes Mother   . Cancer Father   . Diabetes Brother    History   Social History  . Marital Status: Married    Spouse Name: N/A  . Number of Children: N/A  . Years of Education: N/A   Occupational History  . disable    Social History Main Topics  . Smoking status: Never Smoker   . Smokeless tobacco: Never Used  . Alcohol Use: No  . Drug Use: No  . Sexual Activity: Not on  file     Comment: second hand smoke   Other Topics Concern  . None   Social History Narrative   Has one child   Disabled   Past Surgical History  Procedure Laterality Date  . Lumbar fusion  11-30-10    L4 - 5  . Exploration of incision for csf leak  DEC 2011  X2    POST LAMINECOTMY  . Lumbar laminectomy  DEC 2011    L4 - 5  . Lumbar fusion  2000    L2 - 4  . Cervical disc surgery  2005    C5 - 6  . Anterior fusion cervical spine  2007    C5 -6  . Tendon repair  JAN 2010    LEFT INDEX AND LONG FINGERS  . Left wrist tenosynectomy w/ left thumb joint repair  02-24-10  . Abdominal hysterectomy  1987    W/ BSO  . Cholecystectomy  1994  . Knee arthroscopy  LEFT X3 (LAST ONE 2005)  . Carpal tunnel release  RIGHT - 2001  & DEC 2011 W/ BACK SURG.  . Knee arthroscopy  05/17/2011    Procedure: ARTHROSCOPY KNEE;  Surgeon: Bradley Ferris III;  Location: [facility];  Service: Orthopedics;  Laterality: Right;  WITH MEDIAL MENISECTOMY AND removal of suprapatella fat  lump  . Anterior cervical decomp/discectomy fusion N/A 08/09/2012    Procedure: ANTERIOR CERVICAL DECOMPRESSION/DISCECTOMY FUSION 1 LEVEL;  Surgeon: Floyce Stakes, MD;  Location: [facility];  Service: Neurosurgery;  Laterality: N/A;  Cervical four-five  Anterior cervical decompression/diskectomy/fusion  . Finger arthrodesis Right 04/07/2013    Procedure: RIGHT INDEX AND RIGHT LONG DISTAL INTERPHALANGEAL JOINT FUSIONS;  Surgeon: Schuyler Amor, MD;  Location: [place];  Service: Orthopedics;  Laterality: Right;  . Finger arthroplasty Right 04/07/2013    Procedure: RIGHT THUMB [place] ARTHROPLASTY;  Surgeon: Schuyler Amor, MD;  Location: [place];  Service: Orthopedics;  Laterality: Right;  . Dorsal compartment release Right 04/07/2013    Procedure: RIGHT WRIST STS RELEASE;  Surgeon: Schuyler Amor, MD;  Location: [place];  Service: Orthopedics;   Laterality: Right;  . Ganglion cyst excision Right 04/07/2013    Procedure: RIGHT WRIST MASS EXCISION;  Surgeon: Schuyler Amor, MD;  Location: [place];  Service: Orthopedics;  Laterality: Right;   Past Medical History  Diagnosis Date  . Osteoporosis   . Varicosities     venous  . Cardiomyopathy HX --06/2010    EF was 25% during acute illness (PHELONEPHRITIS) Repeat echo 12-06-10 60% showed normal EF.   Marland Kitchen Hypertension   . Headache(784.0)   . PONV (postoperative nausea and vomiting)   . Heart murmur     DENIES S & S   (ECHO JUN'12 W/ CHART)  . History of chronic bronchitis   . Chronic back pain greater than 3 months duration     S/P BACK SURG'S  . Arachnoiditis BILATERAL LEGS    DUE TO MULTIPLE BACK SURG.'S  . Weakness of both legs DUE TO ARACHNOIDITIS    OCCASIONAL USES CANE  . Blood transfusion   . GERD (gastroesophageal reflux disease) AND HIATIAL HERNIA    CONTROLLED W/ NEXIUM  . Acute sinusitis, unspecified   . Anxiety state, unspecified   . Essential hypertension, benign   . Dyslipidemia   . Other malaise and fatigue   . Murmur, heart   . Shortness of breath   . Neuromuscular disorder     numbness and tingling  . Arthritis   . Hx of bladder infections   . Dysphagia     some post op cerv fusion 2/14  . Asthma    BP 102/55 mmHg  Pulse 76  Resp 16  SpO2 99%  Opioid Risk Score:   Fall Risk Score:  `1  Depression screen PHQ 2/9  Depression screen [facility] 2/9 01/06/2015 09/16/2014  Decreased Interest 1 1  Down, Depressed, Hopeless 1 1  PHQ - 2 Score 2 2  Altered sleeping - 2  Tired, decreased energy - 2  Change in appetite - 1  Feeling bad or failure about yourself  - 1  Trouble concentrating - 2  Moving slowly or fidgety/restless - 0  Suicidal thoughts - 0  PHQ-9 Score - 10     Review of Systems  Musculoskeletal:       Spasms  Neurological: Positive for weakness and numbness.       Tingling  All other systems reviewed and are  negative.      Objective:   Physical Exam  Constitutional: She is oriented to person, place, and time. She appears well-developed and well-nourished.  HENT:  Head: Normocephalic and atraumatic.  Neck: Normal range of motion. Neck supple.  Cardiovascular: Normal rate and regular rhythm.   Pulmonary/Chest:  Effort normal and breath sounds normal.  Musculoskeletal:  Normal Muscle Bulk and Muscle Testing Reveals: Upper Extremities: Decreased ROM 90 Degrees and Muscle Strength 4/5 Thoracic and Lumbar Hypersensitivity Lower Extremities: Full ROM and Muscle Strength 5/5 Arises from chair with ease using straight cane for support Narrow Based Gait  Neurological: She is alert and oriented to person, place, and time.  Skin: Skin is warm and dry.  Psychiatric: She has a normal mood and affect.  Nursing note and vitals reviewed.         Assessment & Plan:  1. 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  Refilled: Oxycodone 15mg  one tablet every 6 hours as needed #120 and Opana 40 mg one tablet every 12 hours  # 60 . Second script given for the following month. 2. 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 Dr. Joya Salm Following. Continue Gabapentin 600mg  TID. Encourage Posture and Stretching Exercise.  3. Anxiety/depression: Continue Valium and monitor.  4. Muscle Spasms: Continue Flexeril  5. Neck Pain/ Radiculopath: Dr. Joya Salm Following  20 minutes of face to face patient care time was spent during this visit. All questions were encouraged and answered.   F/U in 2 month

## 2015-01-07 LAB — PMP ALCOHOL METABOLITE (ETG): Ethyl Glucuronide (EtG): NEGATIVE ng/mL

## 2015-01-09 ENCOUNTER — Other Ambulatory Visit: Payer: Self-pay | Admitting: Registered Nurse

## 2015-01-11 LAB — BENZODIAZEPINES (GC/LC/MS), URINE
Alprazolam metabolite (GC/LC/MS), ur confirm: NEGATIVE ng/mL (ref <25)
Clonazepam metabolite (GC/LC/MS), ur confirm: NEGATIVE ng/mL (ref <25)
Flurazepam metabolite (GC/LC/MS), ur confirm: NEGATIVE ng/mL (ref <50)
Lorazepam (GC/LC/MS), ur confirm: NEGATIVE ng/mL (ref <50)
MIDAZOLAMU: NEGATIVE ng/mL (ref <50)
Nordiazepam (GC/LC/MS), ur confirm: 463 ng/mL — AB (ref <50)
OXAZEPAMU: 998 ng/mL — AB (ref <50)
TRIAZOLAMU: NEGATIVE ng/mL (ref <50)
Temazepam (GC/LC/MS), ur confirm: 889 ng/mL — AB (ref <50)

## 2015-01-11 LAB — OPIATES/OPIOIDS (LC/MS-MS)
CODEINE URINE: NEGATIVE ng/mL (ref <50)
Hydrocodone: NEGATIVE ng/mL (ref <50)
Hydromorphone: NEGATIVE ng/mL (ref <50)
MORPHINE: NEGATIVE ng/mL (ref <50)
NORHYDROCODONE, UR: NEGATIVE ng/mL (ref <50)
Noroxycodone, Ur: 754 ng/mL (ref <50)
Oxycodone, ur: 251 ng/mL (ref <50)
Oxymorphone: 19685 ng/mL (ref <50)

## 2015-01-11 LAB — OXYCODONE, URINE (LC/MS-MS)
Noroxycodone, Ur: 754 ng/mL (ref <50)
OXYMORPHONE, URINE: 19685 ng/mL (ref <50)
Oxycodone, ur: 251 ng/mL (ref <50)

## 2015-01-12 LAB — PRESCRIPTION MONITORING PROFILE (SOLSTAS)
Amphetamine/Meth: NEGATIVE ng/mL
BUPRENORPHINE, URINE: NEGATIVE ng/mL
Barbiturate Screen, Urine: NEGATIVE ng/mL
CANNABINOID SCRN UR: NEGATIVE ng/mL
CREATININE, URINE: 105.89 mg/dL (ref >20.0)
Carisoprodol, Urine: NEGATIVE ng/mL
Cocaine Metabolites: NEGATIVE ng/mL
FENTANYL URINE: NEGATIVE ng/mL
MDMA URINE: NEGATIVE ng/mL
MEPERIDINE UR: NEGATIVE ng/mL
Methadone Screen, Urine: NEGATIVE ng/mL
Nitrites, Initial: NEGATIVE ug/mL
Propoxyphene: NEGATIVE ng/mL
Tapentadol, urine: NEGATIVE ng/mL
Tramadol Scrn, Ur: NEGATIVE ng/mL
Zolpidem, Urine: NEGATIVE ng/mL
pH, Initial: 6.1 pH (ref 4.5–8.9)

## 2015-01-13 DIAGNOSIS — E119 Type 2 diabetes mellitus without complications: Secondary | ICD-10-CM | POA: Diagnosis not present

## 2015-01-15 NOTE — Progress Notes (Signed)
Urine drug screen for this encounter is consistent for prescribed medication 

## 2015-01-28 DIAGNOSIS — M542 Cervicalgia: Secondary | ICD-10-CM | POA: Diagnosis not present

## 2015-01-28 DIAGNOSIS — Z6831 Body mass index (BMI) 31.0-31.9, adult: Secondary | ICD-10-CM | POA: Diagnosis not present

## 2015-01-28 DIAGNOSIS — M5136 Other intervertebral disc degeneration, lumbar region: Secondary | ICD-10-CM | POA: Diagnosis not present

## 2015-01-29 ENCOUNTER — Other Ambulatory Visit: Payer: Self-pay | Admitting: Physical Medicine & Rehabilitation

## 2015-02-05 ENCOUNTER — Other Ambulatory Visit: Payer: Self-pay | Admitting: Neurosurgery

## 2015-02-05 DIAGNOSIS — M542 Cervicalgia: Secondary | ICD-10-CM

## 2015-02-05 DIAGNOSIS — M5136 Other intervertebral disc degeneration, lumbar region: Secondary | ICD-10-CM

## 2015-02-11 DIAGNOSIS — Z0000 Encounter for general adult medical examination without abnormal findings: Secondary | ICD-10-CM | POA: Diagnosis not present

## 2015-02-11 DIAGNOSIS — Z1389 Encounter for screening for other disorder: Secondary | ICD-10-CM | POA: Diagnosis not present

## 2015-02-11 DIAGNOSIS — I10 Essential (primary) hypertension: Secondary | ICD-10-CM | POA: Diagnosis not present

## 2015-02-11 DIAGNOSIS — E11649 Type 2 diabetes mellitus with hypoglycemia without coma: Secondary | ICD-10-CM | POA: Diagnosis not present

## 2015-02-17 ENCOUNTER — Ambulatory Visit
Admission: RE | Admit: 2015-02-17 | Discharge: 2015-02-17 | Disposition: A | Discharge status: Home / Self Care | Payer: Medicare Other | Source: Ambulatory Visit | Attending: Neurosurgery | Admitting: Neurosurgery

## 2015-02-17 DIAGNOSIS — M5136 Other intervertebral disc degeneration, lumbar region: Secondary | ICD-10-CM

## 2015-02-17 DIAGNOSIS — M5126 Other intervertebral disc displacement, lumbar region: Secondary | ICD-10-CM | POA: Diagnosis not present

## 2015-02-17 DIAGNOSIS — M542 Cervicalgia: Secondary | ICD-10-CM

## 2015-02-17 DIAGNOSIS — M47814 Spondylosis without myelopathy or radiculopathy, thoracic region: Secondary | ICD-10-CM | POA: Diagnosis not present

## 2015-02-17 DIAGNOSIS — M47813 Spondylosis without myelopathy or radiculopathy, cervicothoracic region: Secondary | ICD-10-CM | POA: Diagnosis not present

## 2015-02-17 MED ORDER — IOHEXOL 300 MG/ML  SOLN
10.0000 mL | Freq: Once | INTRAMUSCULAR | Status: DC | PRN
  Administered 2015-02-17: 10 mL via INTRATHECAL

## 2015-02-17 NOTE — Progress Notes (Addendum)
Pt states she has been off Amitriptyline for the past week. Discharge instructions explained to pt.  Pt took own valium and pain pill because of her contract with pain management.

## 2015-02-17 NOTE — Progress Notes (Signed)
Pt took own pain pill at 1444. No meds given by [place] due to her pain contract.

## 2015-02-17 NOTE — Discharge Instructions (Signed)
Myelogram Discharge Instructions  1. Go home and rest quietly for the next 24 hours.  It is important to lie flat for the next 24 hours.  Get up only to go to the restroom.  You may lie in the bed or on a couch on your back, your stomach, your left side or your right side.  You may have one pillow under your head.  You may have pillows between your knees while you are on your side or under your knees while you are on your back.  2. DO NOT drive today.  Recline the seat as far back as it will go, while still wearing your seat belt, on the way home.  3. You may get up to go to the bathroom as needed.  You may sit up for 10 minutes to eat.  You may resume your normal diet and medications unless otherwise indicated.  Drink lots of extra fluids today and tomorrow.  4. The incidence of headache, nausea, or vomiting is about 5% (one in 20 patients).  If you develop a headache, lie flat and drink plenty of fluids until the headache goes away.  Caffeinated beverages may be helpful.  If you develop severe nausea and vomiting or a headache that does not go away with flat bed rest, call [phone].  5. You may resume normal activities after your 24 hours of bed rest is over; however, do not exert yourself strongly or do any heavy lifting tomorrow. If when you get up you have a headache when standing, go back to bed and force fluids for another 24 hours.  6. Call your physician for a follow-up appointment.  The results of your myelogram will be sent directly to your physician by the following day.  7. If you have any questions or if complications develop after you arrive home, please call [phone].  Discharge instructions have been explained to the patient.  The patient, or the person responsible for the patient, fully understands these instructions.       May resume Amitriptyline on Sept. 1, 2016, after 12:00 pm.

## 2015-03-08 ENCOUNTER — Encounter: Payer: Self-pay | Admitting: Registered Nurse

## 2015-03-08 ENCOUNTER — Encounter: Payer: Medicare Other | Attending: Physical Medicine & Rehabilitation | Admitting: Registered Nurse

## 2015-03-08 VITALS — BP 132/57 | HR 75

## 2015-03-08 DIAGNOSIS — M5441 Lumbago with sciatica, right side: Secondary | ICD-10-CM | POA: Diagnosis not present

## 2015-03-08 DIAGNOSIS — G039 Meningitis, unspecified: Secondary | ICD-10-CM

## 2015-03-08 DIAGNOSIS — M961 Postlaminectomy syndrome, not elsewhere classified: Secondary | ICD-10-CM | POA: Diagnosis not present

## 2015-03-08 DIAGNOSIS — Z79899 Other long term (current) drug therapy: Secondary | ICD-10-CM | POA: Diagnosis not present

## 2015-03-08 DIAGNOSIS — Z5181 Encounter for therapeutic drug level monitoring: Secondary | ICD-10-CM | POA: Diagnosis not present

## 2015-03-08 MED ORDER — OXYMORPHONE HCL ER 40 MG PO TB12: 40.0000 mg | ORAL_TABLET | Freq: Two times a day (BID) | ORAL | Status: DC

## 2015-03-08 MED ORDER — OXYCODONE HCL 15 MG PO TABS: 15.0000 mg | ORAL_TABLET | Freq: Four times a day (QID) | ORAL | Status: DC | PRN

## 2015-03-08 MED ORDER — GABAPENTIN 600 MG PO TABS: 600.0000 mg | ORAL_TABLET | Freq: Three times a day (TID) | ORAL | Status: DC

## 2015-03-08 NOTE — Progress Notes (Signed)
Subjective:    Patient ID: [name] [name], female    DOB: [birth year], 64 y.o.   MRN: [id number]  HPI: [name] is a 64 year old female who returns for follow up for chronic pain and medication refill. She says her pain is located in her mid-lower back radiating into bilateral lower extremities posteriorly and right knee. She rates her pain 10. Her current exercise regime is walking, performing stretching exercises and pool therapy daily when weather permits. Also states she had a Myelogram on 02/17/2015 she had side effects from the dye it resolved in 7 days. Also says she developed right knee pain after her myelogram with a red streak. No red streak noted, no swelling or tenderness. Encouraged to F/U with Dr. Joya Salm she verbalizes understanding.  Pain Inventory Average Pain 10 Pain Right Now 10 My pain is sharp, burning, stabbing and tingling  In the last 24 hours, has pain interfered with the following? General activity 9 Relation with others 9 Enjoyment of life 9 What TIME of day is your pain at its worst? morning, daytime, evening, night Sleep (in general) Fair  Pain is worse with: walking, sitting, inactivity, standing and some activites Pain improves with: rest, heat/ice and medication Relief from Meds: 7  Mobility use a cane use a walker how many minutes can you walk? 3-4 ability to climb steps?  yes do you drive?  yes  Function Do you have any goals in this area?  no  Neuro/Psych weakness tingling trouble walking spasms dizziness  Prior Studies Any changes since last visit?  yes x-rays CT/MRI my eogram  Physicians involved in your care Any changes since last visit?  yes Primary care Stoney Bang, Erneosto Botero Neurosurgeon na   Family History  Problem Relation Age of Onset  . Hypertension Mother   . Heart disease Mother   . Diabetes Mother   . Cancer Father   . Diabetes Brother    Social History   Social History  . Marital Status:  Married    Spouse Name: N/A  . Number of Children: N/A  . Years of Education: N/A   Occupational History  . disable    Social History Main Topics  . Smoking status: Never Smoker   . Smokeless tobacco: Never Used  . Alcohol Use: No  . Drug Use: No  . Sexual Activity: Not Asked     Comment: second hand smoke   Other Topics Concern  . None   Social History Narrative   Has one child   Disabled   Past Surgical History  Procedure Laterality Date  . Lumbar fusion  11-30-10    L4 - 5  . Exploration of incision for csf leak  DEC 2011  X2    POST LAMINECOTMY  . Lumbar laminectomy  DEC 2011    L4 - 5  . Lumbar fusion  2000    L2 - 4  . Cervical disc surgery  2005    C5 - 6  . Anterior fusion cervical spine  2007    C5 -6  . Tendon repair  JAN 2010    LEFT INDEX AND LONG FINGERS  . Left wrist tenosynectomy w/ left thumb joint repair  02-24-10  . Abdominal hysterectomy  1987    W/ BSO  . Cholecystectomy  1994  . Knee arthroscopy  LEFT X3 (LAST ONE 2005)  . Carpal tunnel release  RIGHT - 2001  & DEC 2011 W/ BACK SURG.  Marland Kitchen  Knee arthroscopy  05/17/2011    Procedure: ARTHROSCOPY KNEE;  Surgeon: Bradley Ferris III;  Location: [place];  Service: Orthopedics;  Laterality: Right;  WITH MEDIAL MENISECTOMY AND removal of suprapatella fat lump  . Anterior cervical decomp/discectomy fusion N/A 08/09/2012    Procedure: ANTERIOR CERVICAL DECOMPRESSION/DISCECTOMY FUSION 1 LEVEL;  Surgeon: Floyce Stakes, MD;  Location: [facility];  Service: Neurosurgery;  Laterality: N/A;  Cervical four-five  Anterior cervical decompression/diskectomy/fusion  . Finger arthrodesis Right 04/07/2013    Procedure: RIGHT INDEX AND RIGHT LONG DISTAL INTERPHALANGEAL JOINT FUSIONS;  Surgeon: Schuyler Amor, MD;  Location: [place];  Service: Orthopedics;  Laterality: Right;  . Finger arthroplasty Right 04/07/2013    Procedure: RIGHT THUMB [place] ARTHROPLASTY;  Surgeon: Schuyler Amor, MD;  Location: [place];  Service: Orthopedics;  Laterality: Right;  . Dorsal compartment release Right 04/07/2013    Procedure: RIGHT WRIST STS RELEASE;  Surgeon: Schuyler Amor, MD;  Location: [place];  Service: Orthopedics;  Laterality: Right;  . Ganglion cyst excision Right 04/07/2013    Procedure: RIGHT WRIST MASS EXCISION;  Surgeon: Schuyler Amor, MD;  Location: [place];  Service: Orthopedics;  Laterality: Right;   Past Medical History  Diagnosis Date  . Osteoporosis   . Varicosities     venous  . Cardiomyopathy HX --06/2010    EF was 25% during acute illness (PHELONEPHRITIS) Repeat echo 12-06-10 60% showed normal EF.   Marland Kitchen Hypertension   . Headache(784.0)   . PONV (postoperative nausea and vomiting)   . Heart murmur     DENIES S & S   (ECHO JUN'12 W/ CHART)  . History of chronic bronchitis   . Chronic back pain greater than 3 months duration     S/P BACK SURG'S  . Arachnoiditis BILATERAL LEGS    DUE TO MULTIPLE BACK SURG.'S  . Weakness of both legs DUE TO ARACHNOIDITIS    OCCASIONAL USES CANE  . Blood transfusion   . GERD (gastroesophageal reflux disease) AND HIATIAL HERNIA    CONTROLLED W/ NEXIUM  . Acute sinusitis, unspecified   . Anxiety state, unspecified   . Essential hypertension, benign   . Dyslipidemia   . Other malaise and fatigue   . Murmur, heart   . Shortness of breath   . Neuromuscular disorder     numbness and tingling  . Arthritis   . Hx of bladder infections   . Dysphagia     some post op cerv fusion 2/14  . Asthma    BP 132/57 mmHg  Pulse 75  SpO2 96%  Opioid Risk Score:   Fall Risk Score:  `1  Depression screen PHQ 2/9  Depression screen [facility] 2/9 03/08/2015 01/06/2015 09/16/2014  Decreased Interest 1 1 1   Down, Depressed, Hopeless 1 1 1   PHQ - 2 Score 2 2 2   Altered sleeping - - 2  Tired, decreased energy - - 2  Change in appetite - - 1  Feeling bad or failure about  yourself  - - 1  Trouble concentrating - - 2  Moving slowly or fidgety/restless - - 0  Suicidal thoughts - - 0  PHQ-9 Score - - 10      Review of Systems  Constitutional: Positive for appetite change.  HENT:       High blood sugar Low blood sugar  Gastrointestinal: Positive for nausea, abdominal pain and diarrhea.  All other systems reviewed  and are negative.      Objective:   Physical Exam  Constitutional: She is oriented to person, place, and time. She appears well-developed and well-nourished.  HENT:  Head: Normocephalic and atraumatic.  Neck: Normal range of motion. Neck supple.  Cardiovascular: Normal rate and regular rhythm.   Pulmonary/Chest: Effort normal and breath sounds normal.  Musculoskeletal:  Normal Muscle Bulk and Muscle Testing Reveals: Upper Extremities: Full ROM and Muscle Strength 4/5 Thoracic and Lumbar Hypersensitivity Lower Extremities: Full ROM and Muscle Strength 5/5 Arises from chair slowly using straight cane for support Antalgic Gait  Neurological: She is alert and oriented to person, place, and time.  Skin: Skin is warm and dry.  Psychiatric: She has a normal mood and affect.  Nursing note and vitals reviewed.         Assessment & Plan:  1. 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  Refilled: Oxycodone 15mg  one tablet every 6 hours as needed #120 and Opana 40 mg one tablet every 12 hours  # 60 . Second script given for the following month. 2. 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 Dr. Joya Salm Following. Continue Gabapentin 600mg  TID. Encourage Posture and Stretching Exercise.  3. Anxiety/depression: Continue Valium and monitor.  4. Muscle Spasms: Continue Flexeril  5. Neck Pain/ Radiculopath: Dr. Joya Salm Following  20 minutes of face to face patient care time was spent during this visit. All questions were encouraged and answered.   F/U in 2 month

## 2015-03-11 ENCOUNTER — Other Ambulatory Visit: Payer: Self-pay

## 2015-03-11 DIAGNOSIS — Z1231 Encounter for screening mammogram for malignant neoplasm of breast: Secondary | ICD-10-CM

## 2015-03-15 ENCOUNTER — Ambulatory Visit
Admission: RE | Admit: 2015-03-15 | Discharge: 2015-03-15 | Disposition: A | Discharge status: Home / Self Care | Payer: Medicare Other | Source: Ambulatory Visit

## 2015-03-15 DIAGNOSIS — Z1231 Encounter for screening mammogram for malignant neoplasm of breast: Secondary | ICD-10-CM

## 2015-05-03 DIAGNOSIS — Z23 Encounter for immunization: Secondary | ICD-10-CM | POA: Diagnosis not present

## 2015-05-05 ENCOUNTER — Encounter: Payer: Medicare Other | Attending: Physical Medicine & Rehabilitation | Admitting: Physical Medicine & Rehabilitation

## 2015-05-05 ENCOUNTER — Encounter: Payer: Self-pay | Admitting: Physical Medicine & Rehabilitation

## 2015-05-05 ENCOUNTER — Other Ambulatory Visit: Payer: Self-pay | Admitting: Physical Medicine & Rehabilitation

## 2015-05-05 VITALS — BP 146/68 | HR 59

## 2015-05-05 DIAGNOSIS — G039 Meningitis, unspecified: Secondary | ICD-10-CM | POA: Diagnosis not present

## 2015-05-05 DIAGNOSIS — M5441 Lumbago with sciatica, right side: Secondary | ICD-10-CM | POA: Insufficient documentation

## 2015-05-05 DIAGNOSIS — Z5181 Encounter for therapeutic drug level monitoring: Secondary | ICD-10-CM

## 2015-05-05 DIAGNOSIS — Z79899 Other long term (current) drug therapy: Secondary | ICD-10-CM | POA: Diagnosis not present

## 2015-05-05 DIAGNOSIS — M961 Postlaminectomy syndrome, not elsewhere classified: Secondary | ICD-10-CM | POA: Diagnosis not present

## 2015-05-05 DIAGNOSIS — G894 Chronic pain syndrome: Secondary | ICD-10-CM | POA: Diagnosis not present

## 2015-05-05 MED ORDER — OXYMORPHONE HCL ER 40 MG PO TB12: 40.0000 mg | ORAL_TABLET | Freq: Two times a day (BID) | ORAL | Status: DC

## 2015-05-05 MED ORDER — OXYCODONE HCL 15 MG PO TABS: 15.0000 mg | ORAL_TABLET | Freq: Four times a day (QID) | ORAL | Status: DC | PRN

## 2015-05-05 NOTE — Patient Instructions (Signed)
PLEASE CALL ME WITH ANY PROBLEMS OR QUESTIONS [id number]). HAVE A HAPPY HOLIDAY SEASON!!!   WORK ON YOUR STRETCHING AND RANGE OF MOTION AS MUCH AS POSSIBLE. KEEP UP YOUR WALKING

## 2015-05-05 NOTE — Progress Notes (Signed)
Subjective:    Patient ID: [name] [name], female    DOB: Aug 20, 1950, 64 y.o.   MRN: [id number]  HPI   [name] [name] is here in follow up of her chronic pain and arachnoiditis. She had a follow up CT myelogram in August per Dr. Joya Salm which again showed extensive arachnoiditis. Lumbar fusion site appears stable. Old L1 compression fx stable also. She had terrific pain in her back and legs after the procedure which took a couple weeks to subside.   Otherwise, [name] [name] tries to stay active as always even though it is painful to do so. She hasn't experienced any new weakness. She takes her medications as prescribed. She has a TENS unit. Her pharmacist asked her if i would write a topical pain cream for her back. Pt brought her RX with her today.      Pain Inventory Average Pain 8 Pain Right Now 9 My pain is sharp, burning, tingling and aching  In the last 24 hours, has pain interfered with the following? General activity 8 Relation with others 9 Enjoyment of life 8 What TIME of day is your pain at its worst? all the time Sleep (in general) Fair  Pain is worse with: walking, sitting, standing and some activites Pain improves with: rest, heat/ice and medication Relief from Meds: 8  Mobility use a cane ability to climb steps?  yes do you drive?  yes  Function not employed: date last employed .  Neuro/Psych weakness numbness tingling trouble walking spasms  Prior Studies Any changes since last visit?  no  Physicians involved in your care [place], [place]   Family History  Problem Relation Age of Onset  . Hypertension Mother   . Heart disease Mother   . Diabetes Mother   . Cancer Father   . Diabetes Brother    Social History   Social History  . Marital Status: Married    Spouse Name: N/A  . Number of Children: N/A  . Years of Education: N/A   Occupational History  . disable    Social History Main Topics  . Smoking status: Never Smoker   . Smokeless  tobacco: Never Used  . Alcohol Use: No  . Drug Use: No  . Sexual Activity: Not Asked     Comment: second hand smoke   Other Topics Concern  . None   Social History Narrative   Has one child   Disabled   Past Surgical History  Procedure Laterality Date  . Lumbar fusion  11-30-10    L4 - 5  . Exploration of incision for csf leak  DEC 2011  X2    POST LAMINECOTMY  . Lumbar laminectomy  DEC 2011    L4 - 5  . Lumbar fusion  2000    L2 - 4  . Cervical disc surgery  2005    C5 - 6  . Anterior fusion cervical spine  2007    C5 -6  . Tendon repair  JAN 2010    LEFT INDEX AND LONG FINGERS  . Left wrist tenosynectomy w/ left thumb joint repair  02-24-10  . Abdominal hysterectomy  1987    W/ BSO  . Cholecystectomy  1994  . Knee arthroscopy  LEFT X3 (LAST ONE 2005)  . Carpal tunnel release  RIGHT - 2001  & DEC 2011 W/ BACK SURG.  . Knee arthroscopy  05/17/2011    Procedure: ARTHROSCOPY KNEE;  Surgeon: Karen Chafe Rendall III;  Location: [facility];  Service: Orthopedics;  Laterality: Right;  WITH MEDIAL MENISECTOMY AND removal of suprapatella fat lump  . Anterior cervical decomp/discectomy fusion N/A 08/09/2012    Procedure: ANTERIOR CERVICAL DECOMPRESSION/DISCECTOMY FUSION 1 LEVEL;  Surgeon: Floyce Stakes, MD;  Location: [facility];  Service: Neurosurgery;  Laterality: N/A;  Cervical four-five  Anterior cervical decompression/diskectomy/fusion  . Finger arthrodesis Right 04/07/2013    Procedure: RIGHT INDEX AND RIGHT LONG DISTAL INTERPHALANGEAL JOINT FUSIONS;  Surgeon: Schuyler Amor, MD;  Location: [place];  Service: Orthopedics;  Laterality: Right;  . Finger arthroplasty Right 04/07/2013    Procedure: RIGHT THUMB [place] ARTHROPLASTY;  Surgeon: Schuyler Amor, MD;  Location: [place];  Service: Orthopedics;  Laterality: Right;  . Dorsal compartment release Right 04/07/2013    Procedure: RIGHT WRIST STS RELEASE;  Surgeon: Schuyler Amor, MD;  Location: [place];  Service: Orthopedics;  Laterality: Right;  . Ganglion cyst excision Right 04/07/2013    Procedure: RIGHT WRIST MASS EXCISION;  Surgeon: Schuyler Amor, MD;  Location: [place];  Service: Orthopedics;  Laterality: Right;   Past Medical History  Diagnosis Date  . Osteoporosis   . Varicosities     venous  . Cardiomyopathy HX --06/2010    EF was 25% during acute illness (PHELONEPHRITIS) Repeat echo 12-06-10 60% showed normal EF.   Marland Kitchen Hypertension   . Headache(784.0)   . PONV (postoperative nausea and vomiting)   . Heart murmur     DENIES S & S   (ECHO JUN'12 W/ CHART)  . History of chronic bronchitis   . Chronic back pain greater than 3 months duration     S/P BACK SURG'S  . Arachnoiditis BILATERAL LEGS    DUE TO MULTIPLE BACK SURG.'S  . Weakness of both legs DUE TO ARACHNOIDITIS    OCCASIONAL USES CANE  . Blood transfusion   . GERD (gastroesophageal reflux disease) AND HIATIAL HERNIA    CONTROLLED W/ NEXIUM  . Acute sinusitis, unspecified   . Anxiety state, unspecified   . Essential hypertension, benign   . Dyslipidemia   . Other malaise and fatigue   . Murmur, heart   . Shortness of breath   . Neuromuscular disorder (HCC)     numbness and tingling  . Arthritis   . Hx of bladder infections   . Dysphagia     some post op cerv fusion 2/14  . Asthma    BP 146/68 mmHg  Pulse 59  SpO2 96%  Opioid Risk Score:   Fall Risk Score:  `1  Depression screen PHQ 2/9  Depression screen [facility] 2/9 03/08/2015 01/06/2015 09/16/2014  Decreased Interest 1 1 1   Down, Depressed, Hopeless 1 1 1   PHQ - 2 Score 2 2 2   Altered sleeping - - 2  Tired, decreased energy - - 2  Change in appetite - - 1  Feeling bad or failure about yourself  - - 1  Trouble concentrating - - 2  Moving slowly or fidgety/restless - - 0  Suicidal thoughts - - 0  PHQ-9 Score - - 10      Review of Systems  Musculoskeletal: Positive for  myalgias, back pain, arthralgias, gait problem and neck pain.  Neurological: Positive for weakness and numbness.  All other systems reviewed and are negative.      Objective:   Physical Exam  Constitutional: She is oriented to person, place, and time. She appears well-developed and well-nourished.  HENT: hoarse  voice  Head: Normocephalic and atraumatic.  Eyes: Conjunctivae and EOM are normal. Pupils are equal, round, and reactive to light.  Neck: Normal range of motion. Neck supple.  Cardiovascular: Normal rate and regular rhythm.  Pulmonary/Chest: Effort normal and breath sounds normal.  Abdominal: Soft. Bowel sounds are normal. She exhibits no distension.  Musculoskeletal: tenderness with palpation of both greater trochs Cervical back: She exhibits decreased range of motion and tenderness.  Lumbar back: She exhibits decreased range of motion, tenderness, bony tenderness and spasm. She can bend at the waist to approximately 45 degrees before she begins to hurt. Neurological: She is alert and oriented to person, place, and time. No cranial nerve deficit.  strength grossly 4+/5 in UE's. SStill there are no consistent or focal sensory findings are seen in the arms. Lower extremities continues to show some mild sensory loss at 1-1+ out of 2, particularly below the knees. Inconsistent weakness in the legs with pain inhibition noted once again today.. Reflexes are generally trace to 1+ throughout both legs. UE DTR's are 2+. Gait is wide based and antalgic on the right more than left. She is using a cane for support.  Psychiatric: She has a normal mood and affect. Her behavior is normal. Judgment and thought content normal. She is pleasant as always    Assessment & Plan:   ASSESSMENT:  1. 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 She has documented C6 radiculitis in the past.  2. Post lumbar post laminectomy syndrome.  3. 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 Again, I don't see progression of symptoms/disease on exam.  4. Anxiety/depression secondary to the above    PLAN:  1. Continue gabapentin 600mg  TID for nerve pain. Also signed rx for topical pain cream which might provide some periodic relief.  2. Still probably a good candidate for a spinal stimulator but she is hesitant to have anything else done 3. Continue with Opana ER to 40mg  q12 #60 with second rx for next month. 4. Will continue oxy IR 15mg  for breakthrough. q6 hour prn #120 with second rx for next mont 5. Can consider caudal block to help with symptoms. She will think about this moving forward.  6. Maintain elavil at 100mg  qhs --refilled today.  7. Flexeril and valium prn for spasms  8. The patient will see NP in one month. All questions were encouraged and answered. I stressed the importance of lumbar and LE ROM and persistent activity to prevent further tethering and tightening of her low back and cauda equina.

## 2015-05-06 LAB — PMP ALCOHOL METABOLITE (ETG): ETGU: NEGATIVE ng/mL

## 2015-05-07 ENCOUNTER — Ambulatory Visit (INDEPENDENT_AMBULATORY_CARE_PROVIDER_SITE_OTHER): Payer: Medicare Other | Admitting: Internal Medicine

## 2015-05-07 ENCOUNTER — Encounter: Payer: Self-pay | Admitting: Internal Medicine

## 2015-05-07 ENCOUNTER — Other Ambulatory Visit: Payer: Medicare Other

## 2015-05-07 VITALS — BP 132/70 | HR 98 | Ht 64.0 in | Wt 177.0 lb

## 2015-05-07 DIAGNOSIS — J4530 Mild persistent asthma, uncomplicated: Secondary | ICD-10-CM

## 2015-05-07 DIAGNOSIS — R230 Cyanosis: Secondary | ICD-10-CM

## 2015-05-07 LAB — RHEUMATOID FACTOR: Rheumatoid fact SerPl-aCnc: <10 [IU]/mL

## 2015-05-07 NOTE — Addendum Note (Signed)
Addended by: Maurice March on: 05/07/2015 03:53 PM   Modules accepted: Orders

## 2015-05-07 NOTE — Progress Notes (Signed)
Subjective:     Patient ID: [name] [name], female   DOB: 08-03-1950, 64 y.o.   MRN: [id number]  HPI    PCP is HASANAJ,XAJE A, MD Cards is dr Vita Barley  Body mass index is 26.83 kg/(m^2). Passive smoker x 17 years   IOV 04/22/2012 64 year old female. Referred for dyspnea. Wondering if she has passive smoking related lung disease (husband and son)  Dyspnea: In 2011 December was critically ill following spine surgery, had UTI and low EF but since then has recovered from critical illness. Says she has been dyspneic even before this critical illness for total 3-4 years but since past 1 year more noticeable dyspnea. Sometimes dyspneic walking from room to room. Sometimes dyspneic talking on phone. Triggers include cig smoke  Of husband ("husband's cig smoke is killing me and they think I am crazy") and is associated with cough at that time. Dyspnea is slowly progressive. Severity is mild-moderate. No orthopnea. No paroxysmal nocturnal dyspnea but has woken up wheezing at night since sept 2013 (was exposed to perfume at doctor office and dx acute bronchitis and given abx). THere is associated cough but only episodic.  There is no edema, hemoptysis, unintenional weight loss.   Walking desaturation test on 04/23/2012 185 feet x 3 laps:  did NOT desaturate. Rest pulse ox was 97%, final pulse ox was 94%. HR response 84/min at rest to 86/min at peak exertion.   RElevant hx   Fused L2-L5 x 3 spine surgery due to disc disease, osteoporosis, and also fall, various reasons  LABS - cxr sept 2012: clear lung fields - hgb nov 2012: 11.7gm%  - cardiac 04/01/12: Normal stress test with normal EF - no hx of PFTs  REC #Shortness of breath  - our check list says latex allergy is cautionary for flu shot; do not know why but we will defer flu shot today  - CMA/RN will walk you for oxygen levels now 04/22/2012  - please have full PFT breathing test at [place] or [place] or [place] at your convenience; soon  after doing it call us at [phone] and pass message on so I can review and advise next step      Telephone calls Dec - FEb 2014 PFT 05/27/12 is normal ecwpt mild reduction in diffusion (74%) and 8% BD response. These could be freatures of early emphysema, asthma, or scarring in lungs or normal variant. To better understand this - do HRCT chest. IF this is normal, needs methacholine challenge test for asthma and if that too is normal needs bike pulmonary stress test. She is aware of this algorithm. I will stay in touch by phone. For now, get CT chest (ordered)  CT chest does not show reason for shortness of breath. Next to do is methacholine challenge test; ordered. She is do and give [place] a call so I can review it. She know the algorithm. CT does show 39mm lung nodules -very very very rare < 1:1000 chance it is serious like cancer. She needs repeat ct chest in 1 year without cotnrast (ordered)  Methacholine challenge test on 08/07/2012 is positive for asthma. PD 20 at less than 4.0. No flow volume loop issues   OV 09/24/2012  Followup 3 mm lung nodule and Followup for dyspnea on the basis of positive methacholine challenge test   At the end of last visit she had diagnostic workup. She has been diagnosed with 3 mm lung nodule and also positive methacholine challenge test. For the  past few weeks she is taking Symbicort 2 puff twice daily. This is helping with her symptoms. She is absolutely petrified about developing lung cancer. She is upset that her husband and son continued to smoke and had a house and this is an etiological agent for lung cancer. She is also worried that this will make her asthma worse. I have agreed with her sentiments. There are no new issues   Past, Family, Social reviewed: no change since last visit other than the fact she had disc fusion surgery by Dr. Joya Salm   OV 07/01/2013  Chief Complaint  Patient presents with  . Follow-up    to review CT results. Pt states she has  been using xopenex at least once daily and also has albuterol neb that she is using 2-3 times a week.     FU  - asthma ( reports that she has never smoked. She has never used smokeless tobacco. But PASSIVE SMOKER +)  - lung nodule   In terms of asthma  - Noiv 2014 had AE-asthma due to virus. Rx by PCP. She is upset that hsuband and son do not listen to her and contiue to expose her to smoke and places hjer at risk for Ae-asthma. I have offered to counsel them at a future visit. She is out of symbicort but is compliant. She prefers xopenex mdi and can afford it. Wants scripts for both. Uptodate with flu shot  In terms of nodule  - stable 3 mm nodules at base Jan 2014 through Jan 2015. She is worried about cancer risk due to passive smoking and prefers followup Scans.     OV 03/13/2014  Chief Complaint  Patient presents with  . Follow-up    Pt states the summer months were causing her difficulty breathing. Pt states she has cough in evening and at night with intermittent mucus production, clear in color. Pt c/o DOE and chest tightness with activity.     FU asthma (on basis of positive methacholine challenge test)   - Says this summer 2015 has been a bit roiugh for her due to humidity; lot of down days with chest tightness and wheezing and dyspnea but never any ae-asthma till 2 weeks ago and then given Clarithromycin per hx and 7d prednisone. After this 75% better but still feels not at baseline. Just feels tired and some wheeze and some chest tightness. Currently no fever or chills;.Spuutum is small volume and white  - In addition a month ago when well some family members noted that she was blue on lips and fingers but she was asymptomatic then. Today walk test 185 feet x 3 laps: did not desaturate but did have dyspnea  04/13/2014 Follow up (Asthma-MCT positive )  She returns for 1 month follow up  Was seen last ov with Asthma flare , given steroid taper.  Does feel better but still has  intermittent wheezing.  Feels her  breathing is slightly improved but still having PND, hoarseness and increased DOE with wheezing on/off.  Started Zyrtec with some help.  Decreased cough . Wheezing not as bad, mostly at night or with increased activity. No chest pain.  Drainage is worst symptom.  Spirometry today shows nml lung volumes   FEV1 80%, ratio 74, and FVC 80%.  Denies chest pain, orthopnea, edema or hemoptysis .  >>AR tx regimen   06/18/2014 Acute OV  Complains of sinus pressure, sinus drainage, cough, congestion , wheezing and nasal stuffiness for 1 week.  coughing up thick mucus and blowing out thick mucus. Using her nasal spray and zyrtec without any relief.   she is taking Mucinex without much relief  She denies any chest pain, orthopnea, PND or leg swelling  rec augmentin pred burst  OV 08/19/2014  Chief Complaint  Patient presents with  . Follow-up    Pt c/o recent acute illness. Pt c/o prod cough with clear and yellow mucus, hoarseness, general malaise, mid back pain, fatigue x 3 weeks. Pt stated she was given tamiflu by PCP without any relief.      Follow-up asthma patient. Asthma on basis of positive methacholine challenge test.  Seen end of the summer 2015 for asthma exacerbation and given Augmentin and prednisone. After that was feeling well. Now 3 weeks ago started having nausea associated with diarrhea that is ongoing and persistent and moderate in intensity. Then some 10 weeks ago started developing hoarse voice, chest tightness, wheezing and worsening cough associated with lethargy maybe a runny nose. Was given Tamiflu but says this did not help. Has tried Tessalon cough perles without much relief. Since then the runny nose is resolved but having all the other symptoms and feels miserable along with intermittent unexplained dizziness.  She did note some years ago saw ENT ? 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 Follow-up    f/u CT scan; hoarse; prod cough w/yellow/clear mucus at times; SOB sometimes;   Follow-up asthma Follow-up lung nodules  Follow-up asthma patient. Asthma on basis of positive methacholine challenge test. She is also chronic opiate user. For chronic pain  - In terms of asthma overall stable but she has noticed that in the year 2016 that her "allergies" in the environment has been bothering her and causing her more cough and scratchy throat and occasional wheeziness than baseline. The Symbicort does help but not as well as it did in 2015. In addition she ran out of Symbicort a few weeks ago but she's not sure if this has made her symptoms worse. Asthma control questionnaire 5. scale averages 2 showing active symptoms. Specifically she says that she wakes up a few times at night because of asthma. When she wakes up in the morning she reports mild symptoms because of asthma and she reports being slightly limited with activities because of asthma. In addition she reports a little shortness of breath because of asthma and is wheezing a little of the time but is using albuterol rescue at least between 5 and 8 times a day.   - Lab review from the past shows normal eosinophil counts in 2008 in 2012. She's never had allergy testing but she does have cats. She is open to blood allergy testing today   - Exhaled nitric oxide today is 27 parts per billion and slightly elevated above normal    In terms of lung nodule  - These are micronodules. She is a nonsmoker and very low risk for cancer. She is only supposed to have a scan of the chest in January 2017 but she was very anxious and requested this can be done in June 2016 which we did. I personally visualized the image. These nodules are stable. There  is some incidental findings of hepatic steatosis T6 deformity and  I aortic atherosclerosis. I shared these with her and have asked her to talk to her primary care physician about these Neale Burly, MD        reports that she has never smoked. She has never used smokeless tobacco.  CT chest 12/08/14 -  IMPRESSION: 1. Similar appearance of tiny bibasilar pulmonary nodules, benign. 2. Hepatic steatosis. 3. Mild chronic T6 compression deformity. Prior vertebral augmentation at L1. 4. Aortic atherosclerosis Electronically Signed  By: Abigail Miyamoto M.D.  On: 12/08/2014 14:56  OV 05/07/2015  Chief Complaint  Patient presents with  . Acute Visit    Pt stated recently her lips and feet have turned blue. Pt states there is no pattern to when the discoloration occurs. Pt states her SOB is at baseline. Pt c/o mild non prod cough. Pt states she had an episode of midsternal chest pain last week.    Mild persistent asthma follow-up - Last seen July 2016. Overall she feels that her asthma is more active. She is using albuterol for rescue every day. She says she's compliant with her daily Symbicort. Review of the medication shows that in March 2016 primary care physician started her on benazepril for renal protection due to new onset diabetes. She says since then that albuterol rescue use is more. Otherwise she is okay  New problem- reporting blue lips and blue toes and fingers on and off at rest. Not exertion related not temperature related. She is frustrated as no one has been able to diagnose what this is. She is wondering if she has autoimmune disease.   Current outpatient prescriptions:  .  amitriptyline (ELAVIL) 100 MG tablet, TAKE 1 TABLET BY MOUTH AT BEDTIME, Disp: 30 tablet, Rfl: 4 .  aspirin EC 81 MG tablet, Take 81 mg by mouth See admin instructions. Every other day., Disp: , Rfl:  .  atenolol-chlorthalidone (TENORETIC) 50-25 MG per tablet, Take 1 tablet by mouth daily., Disp: ,  Rfl:  .  azelastine (ASTELIN) 0.1 % nasal spray, Place 2 sprays into both nostrils daily. Use in each nostril as directed, Disp: 30 mL, Rfl: 12 .  benazepril (LOTENSIN) 5 MG tablet, Take 5 mg by mouth daily., Disp: , Rfl:  .  Biotin 10 MG CAPS, Take 10 mg by mouth daily., Disp: , Rfl:  .  budesonide-formoterol (SYMBICORT) 80-4.5 MCG/ACT inhaler, Inhale 2 puffs into the lungs 2 (two) times daily., Disp: 1 Inhaler, Rfl: 6 .  Calcium Carbonate-Vit D-Min 1200-1000 MG-UNIT CHEW, Chew 1 tablet by mouth 2 (two) times daily. , Disp: , Rfl:  .  cyclobenzaprine (FLEXERIL) 10 MG tablet, Take 10 mg by mouth 2 (two) times daily as needed for muscle spasms. , Disp: , Rfl:  .  diazepam (VALIUM) 10 MG tablet, Take 10 mg by mouth 3 (three) times daily as needed (muscle spasms). , Disp: , Rfl:  .  esomeprazole (NEXIUM) 40 MG capsule, Take 40 mg by mouth every morning. , Disp: , Rfl:  .  fluticasone (FLONASE) 50 MCG/ACT nasal spray, Place 2 sprays into both nostrils daily., Disp: 16 g, Rfl: 2 .  gabapentin (NEURONTIN) 600 MG tablet, Take 1 tablet (600 mg total) by mouth 3 (three) times daily. (Patient taking differently: Take 600 mg by mouth 2 (two) times daily. ), Disp: 90 tablet, Rfl: 2 .  ibandronate (BONIVA) 150 MG tablet, Take 150 mg by mouth every 30 (thirty) days. Take in the morning with a full glass of water, on an empty stomach, and do not take  anything else by mouth or lie down for the next 30 min., Disp: , Rfl:  .  levalbuterol (XOPENEX HFA) 45 MCG/ACT inhaler, Inhale 1-2 puffs into the lungs every 4 (four) hours as needed. For shortness of breath, Disp: 1 Inhaler, Rfl: 6 .  oxyCODONE (ROXICODONE) 15 MG immediate release tablet, Take 1 tablet (15 mg total) by mouth every 6 (six) hours as needed. For pain, Disp: 120 tablet, Rfl: 0 .  oxymorphone (OPANA ER) 40 MG 12 hr tablet, Take 1 tablet (40 mg total) by mouth every 12 (twelve) hours., Disp: 60 tablet, Rfl: 0 .  valACYclovir (VALTREX) 500 MG tablet, Take  1,000 mg by mouth daily. , Disp: , Rfl:  .  vitamin B-12 (CYANOCOBALAMIN) 1000 MCG tablet, Take 1,000 mcg by mouth daily. , Disp: , Rfl:  No current facility-administered medications for this visit.  Facility-Administered Medications Ordered in Other Visits:  .  levalbuterol (XOPENEX) nebulizer solution 0.63 mg, 0.63 mg, Nebulization, Once, Tammy Jeralene Cannady, NP     Immunization History  Administered Date(s) Administered  . Influenza Split 04/19/2013, 03/18/2014  . Influenza,inj,Quad PF,36+ Mos 05/04/2015  . Pneumococcal Polysaccharide-23 04/19/2013  . Zoster 05/30/2013     Review of Systems     Objective:   Physical Exam  Constitutional: She is oriented to person, place, and time. She appears well-developed and well-nourished. No distress.  HENT:  Head: Normocephalic and atraumatic.  Right Ear: External ear normal.  Left Ear: External ear normal.  Mouth/Throat: Oropharynx is clear and moist. No oropharyngeal exudate.  Eyes: Conjunctivae and EOM are normal. Pupils are equal, round, and reactive to light. Right eye exhibits no discharge. Left eye exhibits no discharge. No scleral icterus.  Neck: Normal range of motion. Neck supple. No JVD present. No tracheal deviation present. No thyromegaly present.  Cardiovascular: Normal rate, regular rhythm, normal heart sounds and intact distal pulses.  Exam reveals no gallop and no friction rub.   No murmur heard. Pulmonary/Chest: Effort normal and breath sounds normal. No respiratory distress. She has no wheezes. She has no rales. She exhibits no tenderness.  Abdominal: Soft. Bowel sounds are normal. She exhibits no distension and no mass. There is no tenderness. There is no rebound and no guarding.  Musculoskeletal: Normal range of motion. She exhibits no edema or tenderness.  Linear midline surgical scar at low back  Lymphadenopathy:    She has no cervical adenopathy.  Neurological: She is alert and oriented to person, place, and time.  She has normal reflexes. No cranial nerve deficit. She exhibits normal muscle tone. Coordination normal.  Skin: Skin is warm and dry. No rash noted. She is not diaphoretic. No erythema. No pallor.  Tattoos on forearm Somewhat bluish toes bilaterally  Psychiatric: She has a normal mood and affect. Her behavior is normal. Judgment and thought content normal.  Vitals reviewed.   Filed Vitals:   05/07/15 1510  BP: 132/70  Pulse: 98  Height: 5\' 4"  (1.626 m)  Weight: 177 lb (80.287 kg)  SpO2: 63%        Assessment:       ICD-9-CM ICD-10-CM   1. Mild persistent asthma, uncomplicated 123456 [id number]   2. Blue toes 782.5 R23.0        Plan:       #lung nodule  -= small 52mm and stable Jan 2014 - Jan 2015 AND  June 2016  -no more CT chest  #Asthma\ - Blood allergy and IgE test normal July 2016   -  Currently  Somewhat active because you are on lotensin - continue symbicort 80/4.5 2 puff twice daily  - Use albuyterol prn -Use albuterol 2 puff prn - Talk to PCP Neale Burly, MD and get Lotensin changed to another class of BP meds  #blue fingers and toes and lips -  - possible Raynaud  - rule out autoimmune - do ANA, DS-DNA, RF, SCl-70, SSA, SSB  #Followup  6 months or sooner  Wil cal with blood lab results  Dr. Brand Males, M.D., [facility].C.P Pulmonary and Critical Care Medicine Staff Physician [facility] Pager: [phone], If no answer or between  15:00h - 7:00h: call [phone]  05/07/2015 3:48 PM

## 2015-05-07 NOTE — Patient Instructions (Addendum)
ICD-9-CM ICD-10-CM   1. Mild persistent asthma, uncomplicated 123456 [id number]   2. Blue toes 782.5 R23.0     #lung nodule  -= small 24mm and stable Jan 2014 - Jan 2015 AND  June 2016  -no more CT chest  #Asthma\ - Blood allergy and IgE test normal July 2016   - Currently  Somewhat active because you are on lotensin - continue symbicort 80/4.5 2 puff twice daily  - Use albuyterol prn -Use albuterol 2 puff prn - Talk to PCP Neale Burly, MD and get Lotensin changed to another class of BP meds  #blue fingers and toes and lips -  - possible Raynaud  - rule out autoimmune - do ANA, DS-DNA, RF, SCl-70, SSA, SSB  #Followup  6 months or sooner  Wil cal with blood lab results

## 2015-05-09 LAB — OPIATES/OPIOIDS (LC/MS-MS)
Codeine Urine: NEGATIVE ng/mL (ref <50)
HYDROCODONE: NEGATIVE ng/mL (ref <50)
HYDROMORPHONE: NEGATIVE ng/mL (ref <50)
Morphine Urine: NEGATIVE ng/mL (ref <50)
Norhydrocodone, Ur: NEGATIVE ng/mL (ref <50)
Noroxycodone, Ur: 2479 ng/mL (ref <50)
Oxycodone, ur: 791 ng/mL (ref <50)
Oxymorphone: 2083 ng/mL (ref <50)

## 2015-05-09 LAB — OXYCODONE, URINE (LC/MS-MS)
Noroxycodone, Ur: 2479 ng/mL (ref <50)
OXYCODONE, UR: 791 ng/mL (ref <50)
OXYMORPHONE, URINE: 2083 ng/mL (ref <50)

## 2015-05-09 LAB — BENZODIAZEPINES (GC/LC/MS), URINE
Alprazolam metabolite (GC/LC/MS), ur confirm: NEGATIVE ng/mL (ref <25)
CLONAZEPAU: NEGATIVE ng/mL (ref <25)
FLURAZEPAMU: NEGATIVE ng/mL (ref <50)
LORAZEPAMU: NEGATIVE ng/mL (ref <50)
Midazolam (GC/LC/MS), ur confirm: NEGATIVE ng/mL (ref <50)
NORDIAZEPAMU: 352 ng/mL (ref <50)
OXAZEPAMU: 1029 ng/mL (ref <50)
TRIAZOLAMU: NEGATIVE ng/mL (ref <50)
Temazepam (GC/LC/MS), ur confirm: 1034 ng/mL (ref <50)

## 2015-05-10 LAB — ANTI-DNA ANTIBODY, DOUBLE-STRANDED

## 2015-05-10 LAB — ANTI-SCLERODERMA ANTIBODY: SCLERODERMA (SCL-70) (ENA) ANTIBODY, IGG: NEGATIVE

## 2015-05-10 LAB — SJOGRENS SYNDROME-A EXTRACTABLE NUCLEAR ANTIBODY: SSA (RO) (ENA) ANTIBODY, IGG: NEGATIVE

## 2015-05-10 LAB — SJOGRENS SYNDROME-B EXTRACTABLE NUCLEAR ANTIBODY: SSB (LA) (ENA) ANTIBODY, IGG: NEGATIVE

## 2015-05-10 LAB — ANA: Anti Nuclear Antibody(ANA): NEGATIVE

## 2015-05-11 ENCOUNTER — Telehealth: Payer: Self-pay | Admitting: Internal Medicine

## 2015-05-11 LAB — PRESCRIPTION MONITORING PROFILE (SOLSTAS)
AMPHETAMINE/METH: NEGATIVE ng/mL
BARBITURATE SCREEN, URINE: NEGATIVE ng/mL
BUPRENORPHINE, URINE: NEGATIVE ng/mL
CANNABINOID SCRN UR: NEGATIVE ng/mL
Carisoprodol, Urine: NEGATIVE ng/mL
Cocaine Metabolites: NEGATIVE ng/mL
Creatinine, Urine: 56.38 mg/dL (ref >20.0)
FENTANYL URINE: NEGATIVE ng/mL
MDMA URINE: NEGATIVE ng/mL
MEPERIDINE UR: NEGATIVE ng/mL
Methadone Screen, Urine: NEGATIVE ng/mL
NITRITES URINE, INITIAL: NEGATIVE ug/mL
Propoxyphene: NEGATIVE ng/mL
TAPENTADOLUR: NEGATIVE ng/mL
Tramadol Scrn, Ur: NEGATIVE ng/mL
Zolpidem, Urine: NEGATIVE ng/mL
pH, Initial: 7.4 pH (ref 4.5–8.9)

## 2015-05-11 NOTE — Telephone Encounter (Signed)
I spoke with patient about results and she verbalized understanding and had no questions 

## 2015-05-11 NOTE — Telephone Encounter (Signed)
Autoimmune - negative

## 2015-05-17 ENCOUNTER — Telehealth: Payer: Self-pay | Admitting: Internal Medicine

## 2015-05-17 NOTE — Telephone Encounter (Signed)
Called pt. Offered appt for today but wants to be seen tomorrow. appt scheduled with PM in the afternoon. Nothing further needed

## 2015-05-18 ENCOUNTER — Ambulatory Visit (INDEPENDENT_AMBULATORY_CARE_PROVIDER_SITE_OTHER)
Admission: RE | Admit: 2015-05-18 | Discharge: 2015-05-18 | Disposition: A | Discharge status: Home / Self Care | Payer: Medicare Other | Source: Ambulatory Visit | Attending: Pulmonary Disease | Admitting: Pulmonary Disease

## 2015-05-18 ENCOUNTER — Ambulatory Visit (INDEPENDENT_AMBULATORY_CARE_PROVIDER_SITE_OTHER): Payer: Medicare Other | Admitting: Pulmonary Disease

## 2015-05-18 ENCOUNTER — Encounter: Payer: Self-pay | Admitting: Pulmonary Disease

## 2015-05-18 VITALS — BP 118/66 | HR 64 | Temp 98.1°F | Ht 65.0 in | Wt 174.6 lb

## 2015-05-18 DIAGNOSIS — J4531 Mild persistent asthma with (acute) exacerbation: Secondary | ICD-10-CM

## 2015-05-18 DIAGNOSIS — R05 Cough: Secondary | ICD-10-CM | POA: Diagnosis not present

## 2015-05-18 MED ORDER — DOXYCYCLINE HYCLATE 100 MG PO TABS: 100.0000 mg | ORAL_TABLET | Freq: Two times a day (BID) | ORAL | Status: DC

## 2015-05-18 MED ORDER — METHYLPREDNISOLONE ACETATE 80 MG/ML IJ SUSP
80.0000 mg | Freq: Once | INTRAMUSCULAR | Status: AC
Start: 2015-05-18 — End: 2015-05-18
  Administered 2015-05-18: 80 mg via INTRAMUSCULAR

## 2015-05-18 MED ORDER — PREDNISONE 10 MG PO TABS: ORAL_TABLET | ORAL | Status: DC

## 2015-05-18 NOTE — Progress Notes (Signed)
Subjective:    Patient ID: [name] [name], female    DOB: 1950-10-05, 64 y.o.   MRN: [id number]  HPI Acute visit for asthma exacerbation.  64 Y/O with asthma. Pt of Dr. Chase Caller. Symptoms started 10 days ago with allergy type symptoms. Has a sick exposure to grandchild who was ill. Developed cough productive of white sputum production. Associated with dyspnea, wheezing, fevers and chill. She was started on amox 1 week ago by PCP but there is no change in symptoms.   Past Medical History  Diagnosis Date  . Osteoporosis   . Varicosities     venous  . Cardiomyopathy HX --06/2010    EF was 25% during acute illness (PHELONEPHRITIS) Repeat echo 12-06-10 60% showed normal EF.   Marland Kitchen Hypertension   . Headache(784.0)   . PONV (postoperative nausea and vomiting)   . Heart murmur     DENIES S & S   (ECHO JUN'12 W/ CHART)  . History of chronic bronchitis   . Chronic back pain greater than 3 months duration     S/P BACK SURG'S  . Arachnoiditis BILATERAL LEGS    DUE TO MULTIPLE BACK SURG.'S  . Weakness of both legs DUE TO ARACHNOIDITIS    OCCASIONAL USES CANE  . Blood transfusion   . GERD (gastroesophageal reflux disease) AND HIATIAL HERNIA    CONTROLLED W/ NEXIUM  . Acute sinusitis, unspecified   . Anxiety state, unspecified   . Essential hypertension, benign   . Dyslipidemia   . Other malaise and fatigue   . Murmur, heart   . Shortness of breath   . Neuromuscular disorder (HCC)     numbness and tingling  . Arthritis   . Hx of bladder infections   . Dysphagia     some post op cerv fusion 2/14  . Asthma     Current outpatient prescriptions:  .  amitriptyline (ELAVIL) 100 MG tablet, TAKE 1 TABLET BY MOUTH AT BEDTIME, Disp: 30 tablet, Rfl: 4 .  aspirin EC 81 MG tablet, Take 81 mg by mouth See admin instructions. Every other day., Disp: , Rfl:  .  atenolol-chlorthalidone (TENORETIC) 50-25 MG per tablet, Take 1 tablet by mouth daily., Disp: , Rfl:  .  azelastine (ASTELIN) 0.1 % nasal  spray, Place 2 sprays into both nostrils daily. Use in each nostril as directed, Disp: 30 mL, Rfl: 12 .  benazepril (LOTENSIN) 5 MG tablet, Take 5 mg by mouth daily., Disp: , Rfl:  .  Biotin 10 MG CAPS, Take 10 mg by mouth daily., Disp: , Rfl:  .  budesonide-formoterol (SYMBICORT) 80-4.5 MCG/ACT inhaler, Inhale 2 puffs into the lungs 2 (two) times daily., Disp: 1 Inhaler, Rfl: 6 .  Calcium Carbonate-Vit D-Min 1200-1000 MG-UNIT CHEW, Chew 1 tablet by mouth 2 (two) times daily. , Disp: , Rfl:  .  cyclobenzaprine (FLEXERIL) 10 MG tablet, Take 10 mg by mouth 2 (two) times daily as needed for muscle spasms. , Disp: , Rfl:  .  diazepam (VALIUM) 10 MG tablet, Take 10 mg by mouth 3 (three) times daily as needed (muscle spasms). , Disp: , Rfl:  .  esomeprazole (NEXIUM) 40 MG capsule, Take 40 mg by mouth every morning. , Disp: , Rfl:  .  fluticasone (FLONASE) 50 MCG/ACT nasal spray, Place 2 sprays into both nostrils daily., Disp: 16 g, Rfl: 2 .  gabapentin (NEURONTIN) 600 MG tablet, Take 1 tablet (600 mg total) by mouth 3 (three) times daily. (Patient taking differently: Take 600  mg by mouth 2 (two) times daily. ), Disp: 90 tablet, Rfl: 2 .  ibandronate (BONIVA) 150 MG tablet, Take 150 mg by mouth every 30 (thirty) days. Take in the morning with a full glass of water, on an empty stomach, and do not take anything else by mouth or lie down for the next 30 min., Disp: , Rfl:  .  levalbuterol (XOPENEX HFA) 45 MCG/ACT inhaler, Inhale 1-2 puffs into the lungs every 4 (four) hours as needed. For shortness of breath, Disp: 1 Inhaler, Rfl: 6 .  lidocaine (XYLOCAINE) 5 % ointment, Apply as needed for back pain, Disp: , Rfl:  .  oxyCODONE (ROXICODONE) 15 MG immediate release tablet, Take 1 tablet (15 mg total) by mouth every 6 (six) hours as needed. For pain, Disp: 120 tablet, Rfl: 0 .  oxymorphone (OPANA ER) 40 MG 12 hr tablet, Take 1 tablet (40 mg total) by mouth every 12 (twelve) hours., Disp: 60 tablet, Rfl: 0 .   valACYclovir (VALTREX) 500 MG tablet, Take 1,000 mg by mouth daily. , Disp: , Rfl:  .  vitamin B-12 (CYANOCOBALAMIN) 1000 MCG tablet, Take 1,000 mcg by mouth daily. , Disp: , Rfl:  No current facility-administered medications for this visit.  Facility-Administered Medications Ordered in Other Visits:  .  levalbuterol (XOPENEX) nebulizer solution 0.63 mg, 0.63 mg, Nebulization, Once, Tammy S Parrett, NP   Review of Systems     Objective:   Physical Exam Blood pressure 118/66, pulse 64, temperature 98.1 F (36.7 C), temperature source Oral, height 5\' 5"  (1.651 m), weight 174 lb 9.6 oz (79.198 kg), SpO2 100 %.  Gen: Mild resp distress HEENT: No nasal inflammation or discharge. Oropharynx appers clear. CVS: RRR, No MRG RS: B/L exp wheeze.  Abd: Soft, + BS Ext: No edema.    Assessment & Plan:  Mild persistent asthma with acute exacerbation: May have developed an infection with fevers, chills and productive sputum.  Plan: Get a chest X ray. Doxycline 100 mg bid for 7 days. One dose of solumedrol 80 mg in office today and then start prednisone taper starting at 60 mg. Reduce by 10 mg every 3 days. Mucinex and tussionex for cough

## 2015-05-18 NOTE — Patient Instructions (Signed)
We will get a chest X ray. Give doxycline 100 mg bid for 7 days. You will get one dose of solumedrol 80 mg in office today and then start prednisone taper starting at 60 mg. Reduce by 10 mg every 3 days. Mucinex and tussionex for cough

## 2015-05-20 DIAGNOSIS — E1165 Type 2 diabetes mellitus with hyperglycemia: Secondary | ICD-10-CM | POA: Diagnosis not present

## 2015-05-20 DIAGNOSIS — I10 Essential (primary) hypertension: Secondary | ICD-10-CM | POA: Diagnosis not present

## 2015-05-20 DIAGNOSIS — J4541 Moderate persistent asthma with (acute) exacerbation: Secondary | ICD-10-CM | POA: Diagnosis not present

## 2015-05-21 ENCOUNTER — Other Ambulatory Visit: Payer: Self-pay | Admitting: Emergency Medicine

## 2015-05-21 MED ORDER — AZELASTINE HCL 0.1 % NA SOLN: 2.0000 | Freq: Every day | NASAL | Status: DC

## 2015-06-03 ENCOUNTER — Telehealth: Payer: Self-pay | Admitting: Pulmonary Disease

## 2015-06-03 NOTE — Progress Notes (Signed)
Quick Note:  Spoke with pt and notified of results per Dr. Mannam. Pt verbalized understanding and denied any questions.  ______ 

## 2015-06-03 NOTE — Telephone Encounter (Signed)
Please let the patient know that CXR does not show any pneumonia. Spoke with pt and notified of results per Dr. Vaughan Browner. Pt verbalized understanding and denied any questions.

## 2015-07-05 ENCOUNTER — Encounter: Payer: Self-pay | Admitting: Registered Nurse

## 2015-07-05 ENCOUNTER — Other Ambulatory Visit: Payer: Self-pay | Admitting: Physical Medicine & Rehabilitation

## 2015-07-05 ENCOUNTER — Encounter: Payer: Medicare Other | Attending: Physical Medicine & Rehabilitation | Admitting: Registered Nurse

## 2015-07-05 VITALS — BP 130/74 | HR 64

## 2015-07-05 DIAGNOSIS — G8929 Other chronic pain: Secondary | ICD-10-CM

## 2015-07-05 DIAGNOSIS — Z5181 Encounter for therapeutic drug level monitoring: Secondary | ICD-10-CM | POA: Diagnosis not present

## 2015-07-05 DIAGNOSIS — G039 Meningitis, unspecified: Secondary | ICD-10-CM

## 2015-07-05 DIAGNOSIS — R279 Unspecified lack of coordination: Secondary | ICD-10-CM

## 2015-07-05 DIAGNOSIS — M5441 Lumbago with sciatica, right side: Secondary | ICD-10-CM

## 2015-07-05 DIAGNOSIS — M545 Low back pain, unspecified: Secondary | ICD-10-CM

## 2015-07-05 DIAGNOSIS — Z79899 Other long term (current) drug therapy: Secondary | ICD-10-CM

## 2015-07-05 DIAGNOSIS — M961 Postlaminectomy syndrome, not elsewhere classified: Secondary | ICD-10-CM | POA: Diagnosis not present

## 2015-07-05 MED ORDER — OXYMORPHONE HCL ER 40 MG PO TB12: 40.0000 mg | ORAL_TABLET | Freq: Two times a day (BID) | ORAL | Status: DC

## 2015-07-05 MED ORDER — OXYCODONE HCL 15 MG PO TABS: 15.0000 mg | ORAL_TABLET | Freq: Four times a day (QID) | ORAL | Status: DC | PRN

## 2015-07-05 MED ORDER — GABAPENTIN 600 MG PO TABS: 600.0000 mg | ORAL_TABLET | Freq: Three times a day (TID) | ORAL | Status: DC

## 2015-07-05 NOTE — Progress Notes (Signed)
Subjective:    Patient ID: [name] [name], female    DOB: 11-27-1950, 65 y.o.   MRN: [id number]  HPI: [name] is a 65 year old female who returns for follow up for chronic pain and medication refill. She says her pain is located in her mid-lower back radiating into her right hip and right  lower extremity posteriorly. She rates her pain 9. Her current exercise regime is walking, performing stretching exercises.  Also states on January 07/2015 her son was walking into her bedroom when he lost his balance and landed on her back. She landed on her back, she has been experiencing increase intensity of pain in her entire back. She will be following up with Dr. Joya Salm she states. She didn't seek medical attention. Will send referral to physical therapy for balance and accurate order for cane, she verbalizes understanding.  Pain Inventory Average Pain 7 Pain Right Now 9 My pain is sharp, burning, stabbing, tingling and aching  In the last 24 hours, has pain interfered with the following? General activity 8 Relation with others 7 Enjoyment of life 7 What TIME of day is your pain at its worst? all Sleep (in general) Fair  Pain is worse with: walking, sitting, standing and some activites Pain improves with: rest, heat/ice and medication Relief from Meds: 7  Mobility use a cane how many minutes can you walk? 10  Function not employed: date last employed .  Neuro/Psych weakness numbness tingling trouble walking spasms  Prior Studies Any changes since last visit?  no  Physicians involved in your care Any changes since last visit?  no   Family History  Problem Relation Age of Onset  . Hypertension Mother   . Heart disease Mother   . Diabetes Mother   . Cancer Father   . Diabetes Brother    Social History   Social History  . Marital Status: Married    Spouse Name: N/A  . Number of Children: N/A  . Years of Education: N/A   Occupational History  . disable     Social History Main Topics  . Smoking status: Never Smoker   . Smokeless tobacco: Never Used  . Alcohol Use: No  . Drug Use: No  . Sexual Activity: Not Asked     Comment: second hand smoke   Other Topics Concern  . None   Social History Narrative   Has one child   Disabled   Past Surgical History  Procedure Laterality Date  . Lumbar fusion  11-30-10    L4 - 5  . Exploration of incision for csf leak  DEC 2011  X2    POST LAMINECOTMY  . Lumbar laminectomy  DEC 2011    L4 - 5  . Lumbar fusion  2000    L2 - 4  . Cervical disc surgery  2005    C5 - 6  . Anterior fusion cervical spine  2007    C5 -6  . Tendon repair  JAN 2010    LEFT INDEX AND LONG FINGERS  . Left wrist tenosynectomy w/ left thumb joint repair  02-24-10  . Abdominal hysterectomy  1987    W/ BSO  . Cholecystectomy  1994  . Knee arthroscopy  LEFT X3 (LAST ONE 2005)  . Carpal tunnel release  RIGHT - 2001  & DEC 2011 W/ BACK SURG.  . Knee arthroscopy  05/17/2011    Procedure: ARTHROSCOPY KNEE;  Surgeon: Karen Chafe Rendall III;  Location:  [place];  Service: Orthopedics;  Laterality: Right;  WITH MEDIAL MENISECTOMY AND removal of suprapatella fat lump  . Anterior cervical decomp/discectomy fusion N/A 08/09/2012    Procedure: ANTERIOR CERVICAL DECOMPRESSION/DISCECTOMY FUSION 1 LEVEL;  Surgeon: Floyce Stakes, MD;  Location: [facility];  Service: Neurosurgery;  Laterality: N/A;  Cervical four-five  Anterior cervical decompression/diskectomy/fusion  . Finger arthrodesis Right 04/07/2013    Procedure: RIGHT INDEX AND RIGHT LONG DISTAL INTERPHALANGEAL JOINT FUSIONS;  Surgeon: Schuyler Amor, MD;  Location: [place];  Service: Orthopedics;  Laterality: Right;  . Finger arthroplasty Right 04/07/2013    Procedure: RIGHT THUMB [place] ARTHROPLASTY;  Surgeon: Schuyler Amor, MD;  Location: [place];  Service: Orthopedics;  Laterality: Right;  . Dorsal compartment  release Right 04/07/2013    Procedure: RIGHT WRIST STS RELEASE;  Surgeon: Schuyler Amor, MD;  Location: [place];  Service: Orthopedics;  Laterality: Right;  . Ganglion cyst excision Right 04/07/2013    Procedure: RIGHT WRIST MASS EXCISION;  Surgeon: Schuyler Amor, MD;  Location: [place];  Service: Orthopedics;  Laterality: Right;   Past Medical History  Diagnosis Date  . Osteoporosis   . Varicosities     venous  . Cardiomyopathy HX --06/2010    EF was 25% during acute illness (PHELONEPHRITIS) Repeat echo 12-06-10 60% showed normal EF.   Marland Kitchen Hypertension   . Headache(784.0)   . PONV (postoperative nausea and vomiting)   . Heart murmur     DENIES S & S   (ECHO JUN'12 W/ CHART)  . History of chronic bronchitis   . Chronic back pain greater than 3 months duration     S/P BACK SURG'S  . Arachnoiditis BILATERAL LEGS    DUE TO MULTIPLE BACK SURG.'S  . Weakness of both legs DUE TO ARACHNOIDITIS    OCCASIONAL USES CANE  . Blood transfusion   . GERD (gastroesophageal reflux disease) AND HIATIAL HERNIA    CONTROLLED W/ NEXIUM  . Acute sinusitis, unspecified   . Anxiety state, unspecified   . Essential hypertension, benign   . Dyslipidemia   . Other malaise and fatigue   . Murmur, heart   . Shortness of breath   . Neuromuscular disorder (HCC)     numbness and tingling  . Arthritis   . Hx of bladder infections   . Dysphagia     some post op cerv fusion 2/14  . Asthma    BP 130/74 mmHg  Pulse 64  SpO2 100%  Opioid Risk Score:   Fall Risk Score:  `1  Depression screen PHQ 2/9  Depression screen [facility] 2/9 07/05/2015 03/08/2015 01/06/2015 09/16/2014  Decreased Interest 1 1 1 1   Down, Depressed, Hopeless 1 1 1 1   PHQ - 2 Score 2 2 2 2   Altered sleeping - - - 2  Tired, decreased energy - - - 2  Change in appetite - - - 1  Feeling bad or failure about yourself  - - - 1  Trouble concentrating - - - 2  Moving slowly or fidgety/restless - - -  0  Suicidal thoughts - - - 0  PHQ-9 Score - - - 10     Review of Systems  All other systems reviewed and are negative.      Objective:   Physical Exam  Constitutional: She is oriented to person, place, and time. She appears well-developed and well-nourished.  HENT:  Head: Normocephalic and atraumatic.  Neck: Normal range of motion. Neck supple.  Cardiovascular: Normal rate and regular rhythm.   Pulmonary/Chest: Effort normal and breath sounds normal.  Musculoskeletal:  Normal Muscle Bulk and Muscle Testing Reveals: Upper Extremities: Full ROM and Muscle Strength 5/5 Thoracic and Lumbar Hypersensitivity Lower Extremities: Full ROM and Muscle Strength 5/5 Arises from chair slowly Narrow Based Gait  Neurological: She is alert and oriented to person, place, and time.  Skin: Skin is warm and dry.  Psychiatric: She has a normal mood and affect.  Nursing note and vitals reviewed.         Assessment & Plan:  1. 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  Refilled: Oxycodone 15mg  one tablet every 6 hours as needed #120 and Opana 40 mg one tablet every 12 hours  # 60 . Second script given for the following month. 2. 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 Dr. Joya Salm Following. Continue Gabapentin 600mg  TID. Encourage Posture and Stretching Exercise.  3. Anxiety/depression: Continue Valium and monitor.  4. Muscle Spasms: Continue Flexeril  5. Neck Pain/ Radiculopath: No complaints today.Dr. Joya Salm Following 6. Acute exacerbation of chronic back pain: RX: Xray  20 minutes of face to face patient care time was spent during this visit. All questions were encouraged and answered.   F/U in 2 month

## 2015-07-06 ENCOUNTER — Telehealth: Payer: Self-pay | Admitting: Registered Nurse

## 2015-07-06 NOTE — Telephone Encounter (Signed)
Return Ms. Mashek call she is aware the X-ray ordered has been placed. Also states she called Dr. Joya Salm office yesterday 07/05/15 awaiting a call back. She verbalizes understanding.

## 2015-07-19 DIAGNOSIS — I10 Essential (primary) hypertension: Secondary | ICD-10-CM | POA: Diagnosis not present

## 2015-07-19 DIAGNOSIS — Z6831 Body mass index (BMI) 31.0-31.9, adult: Secondary | ICD-10-CM | POA: Diagnosis not present

## 2015-07-19 DIAGNOSIS — M5136 Other intervertebral disc degeneration, lumbar region: Secondary | ICD-10-CM | POA: Diagnosis not present

## 2015-07-21 ENCOUNTER — Other Ambulatory Visit: Payer: Self-pay | Admitting: Neurosurgery

## 2015-07-21 DIAGNOSIS — M5136 Other intervertebral disc degeneration, lumbar region: Secondary | ICD-10-CM

## 2015-07-29 ENCOUNTER — Other Ambulatory Visit: Payer: Medicare Other

## 2015-08-06 ENCOUNTER — Inpatient Hospital Stay
Admission: RE | Admit: 2015-08-06 | Discharge: 2015-08-06 | Disposition: A | Discharge status: Home / Self Care | Payer: Medicare Other | Source: Ambulatory Visit | Attending: Neurosurgery | Admitting: Neurosurgery

## 2015-08-06 ENCOUNTER — Other Ambulatory Visit: Payer: Medicare Other

## 2015-08-06 NOTE — Discharge Instructions (Signed)
Myelogram Discharge Instructions  1. Go home and rest quietly for the next 24 hours.  It is important to lie flat for the next 24 hours.  Get up only to go to the restroom.  You may lie in the bed or on a couch on your back, your stomach, your left side or your right side.  You may have one pillow under your head.  You may have pillows between your knees while you are on your side or under your knees while you are on your back.  2. DO NOT drive today.  Recline the seat as far back as it will go, while still wearing your seat belt, on the way home.  3. You may get up to go to the bathroom as needed.  You may sit up for 10 minutes to eat.  You may resume your normal diet and medications unless otherwise indicated.  Drink lots of extra fluids today and tomorrow.  4. The incidence of headache, nausea, or vomiting is about 5% (one in 20 patients).  If you develop a headache, lie flat and drink plenty of fluids until the headache goes away.  Caffeinated beverages may be helpful.  If you develop severe nausea and vomiting or a headache that does not go away with flat bed rest, call [phone].  5. You may resume normal activities after your 24 hours of bed rest is over; however, do not exert yourself strongly or do any heavy lifting tomorrow. If when you get up you have a headache when standing, go back to bed and force fluids for another 24 hours.  6. Call your physician for a follow-up appointment.  The results of your myelogram will be sent directly to your physician by the following day.  7. If you have any questions or if complications develop after you arrive home, please call [phone].  Discharge instructions have been explained to the patient.  The patient, or the person responsible for the patient, fully understands these instructions.       May resume Amitriptyline and Effexor on Feb. 18, 2017, after 11:00 am.

## 2015-08-09 ENCOUNTER — Ambulatory Visit: Payer: Medicare Other | Admitting: Internal Medicine

## 2015-08-18 ENCOUNTER — Other Ambulatory Visit: Payer: Medicare Other

## 2015-08-25 ENCOUNTER — Ambulatory Visit
Admission: RE | Admit: 2015-08-25 | Discharge: 2015-08-25 | Disposition: A | Discharge status: Home / Self Care | Payer: Medicare Other | Source: Ambulatory Visit | Attending: Neurosurgery | Admitting: Neurosurgery

## 2015-08-25 VITALS — BP 125/51 | HR 59

## 2015-08-25 DIAGNOSIS — M5124 Other intervertebral disc displacement, thoracic region: Secondary | ICD-10-CM | POA: Diagnosis not present

## 2015-08-25 DIAGNOSIS — M5126 Other intervertebral disc displacement, lumbar region: Secondary | ICD-10-CM | POA: Diagnosis not present

## 2015-08-25 DIAGNOSIS — M5136 Other intervertebral disc degeneration, lumbar region: Secondary | ICD-10-CM

## 2015-08-25 DIAGNOSIS — M961 Postlaminectomy syndrome, not elsewhere classified: Secondary | ICD-10-CM

## 2015-08-25 MED ORDER — MEPERIDINE HCL 100 MG/ML IJ SOLN
100.0000 mg | Freq: Once | INTRAMUSCULAR | Status: AC
Start: 2015-08-25 — End: 2015-08-25
  Administered 2015-08-25: 100 mg via INTRAMUSCULAR

## 2015-08-25 MED ORDER — IOHEXOL 300 MG/ML  SOLN
10.0000 mL | Freq: Once | INTRAMUSCULAR | Status: AC | PRN
  Administered 2015-08-25: 10 mL via INTRATHECAL

## 2015-08-25 MED ORDER — ONDANSETRON HCL 4 MG/2ML IJ SOLN
4.0000 mg | Freq: Once | INTRAMUSCULAR | Status: AC
  Administered 2015-08-25: 4 mg via INTRAMUSCULAR

## 2015-08-25 NOTE — Progress Notes (Signed)
Pt took her own Valium and her own pain medication. Discharge instructions explained to pt.

## 2015-08-25 NOTE — Discharge Instructions (Signed)
Myelogram Discharge Instructions  1. Go home and rest quietly for the next 24 hours.  It is important to lie flat for the next 24 hours.  Get up only to go to the restroom.  You may lie in the bed or on a couch on your back, your stomach, your left side or your right side.  You may have one pillow under your head.  You may have pillows between your knees while you are on your side or under your knees while you are on your back.  2. DO NOT drive today.  Recline the seat as far back as it will go, while still wearing your seat belt, on the way home.  3. You may get up to go to the bathroom as needed.  You may sit up for 10 minutes to eat.  You may resume your normal diet and medications unless otherwise indicated.  Drink lots of extra fluids today and tomorrow.  4. The incidence of headache, nausea, or vomiting is about 5% (one in 20 patients).  If you develop a headache, lie flat and drink plenty of fluids until the headache goes away.  Caffeinated beverages may be helpful.  If you develop severe nausea and vomiting or a headache that does not go away with flat bed rest, call [phone].  5. You may resume normal activities after your 24 hours of bed rest is over; however, do not exert yourself strongly or do any heavy lifting tomorrow. If when you get up you have a headache when standing, go back to bed and force fluids for another 24 hours.  6. Call your physician for a follow-up appointment.  The results of your myelogram will be sent directly to your physician by the following day.  7. If you have any questions or if complications develop after you arrive home, please call [phone].  Discharge instructions have been explained to the patient.  The patient, or the person responsible for the patient, fully understands these instructions.       May resume Effexor and Amitriptyline on August 26, 2015, after 1:00 pm.

## 2015-08-30 ENCOUNTER — Encounter: Payer: Self-pay | Admitting: Registered Nurse

## 2015-08-30 ENCOUNTER — Encounter: Payer: Medicare Other | Attending: Physical Medicine & Rehabilitation | Admitting: Registered Nurse

## 2015-08-30 VITALS — BP 128/67 | HR 67 | Resp 14

## 2015-08-30 DIAGNOSIS — M5441 Lumbago with sciatica, right side: Secondary | ICD-10-CM | POA: Diagnosis not present

## 2015-08-30 DIAGNOSIS — Z5181 Encounter for therapeutic drug level monitoring: Secondary | ICD-10-CM | POA: Diagnosis not present

## 2015-08-30 DIAGNOSIS — G039 Meningitis, unspecified: Secondary | ICD-10-CM | POA: Diagnosis not present

## 2015-08-30 DIAGNOSIS — M961 Postlaminectomy syndrome, not elsewhere classified: Secondary | ICD-10-CM | POA: Diagnosis not present

## 2015-08-30 DIAGNOSIS — Z79899 Other long term (current) drug therapy: Secondary | ICD-10-CM | POA: Diagnosis not present

## 2015-08-30 DIAGNOSIS — G894 Chronic pain syndrome: Secondary | ICD-10-CM

## 2015-08-30 MED ORDER — OXYMORPHONE HCL ER 40 MG PO TB12: 40.0000 mg | ORAL_TABLET | Freq: Two times a day (BID) | ORAL | Status: DC

## 2015-08-30 MED ORDER — OXYCODONE HCL 15 MG PO TABS: 15.0000 mg | ORAL_TABLET | Freq: Four times a day (QID) | ORAL | Status: DC | PRN

## 2015-08-30 NOTE — Progress Notes (Signed)
Subjective:    Patient ID: [name] [name], female    DOB: 02-Feb-1951, 65 y.o.   MRN: [id number]  HPI: [name] [name] is a 65 year old female who returns for follow up for chronic pain and medication refill. She states her pain is located in her mid-lower back radiating into her  right lower extremity laterally. She rates her pain 7. Her current exercise regime is walking and performing stretching exercises.  Also states she had a Lumbar Myelogram she received Demerol 100 mg on 08/25/2015.  Pain Inventory Average Pain 8 Pain Right Now 7 My pain is sharp, burning, stabbing, tingling and aching  In the last 24 hours, has pain interfered with the following? General activity 7 Relation with others 7 Enjoyment of life 7 What TIME of day is your pain at its worst? morning, daytime, evening, night Sleep (in general) Fair  Pain is worse with: walking, bending, sitting, standing and some activites Pain improves with: rest, heat/ice and medication Relief from Meds: 7  Mobility use a cane how many minutes can you walk? 10-15 ability to climb steps?  yes do you drive?  yes  Function not employed: date last employed NA  Neuro/Psych No problems in this area  Prior Studies x-rays CT/MRI myleogram   Physicians involved in your care Primary care . Neurosurgeon .   Family History  Problem Relation Age of Onset  . Hypertension Mother   . Heart disease Mother   . Diabetes Mother   . Cancer Father   . Diabetes Brother    Social History   Social History  . Marital Status: Married    Spouse Name: N/A  . Number of Children: N/A  . Years of Education: N/A   Occupational History  . disable    Social History Main Topics  . Smoking status: Never Smoker   . Smokeless tobacco: Never Used  . Alcohol Use: No  . Drug Use: No  . Sexual Activity: Not Asked     Comment: second hand smoke   Other Topics Concern  . None   Social History Narrative   Has one child   Disabled    Past Surgical History  Procedure Laterality Date  . Lumbar fusion  11-30-10    L4 - 5  . Exploration of incision for csf leak  DEC 2011  X2    POST LAMINECOTMY  . Lumbar laminectomy  DEC 2011    L4 - 5  . Lumbar fusion  2000    L2 - 4  . Cervical disc surgery  2005    C5 - 6  . Anterior fusion cervical spine  2007    C5 -6  . Tendon repair  JAN 2010    LEFT INDEX AND LONG FINGERS  . Left wrist tenosynectomy w/ left thumb joint repair  02-24-10  . Abdominal hysterectomy  1987    W/ BSO  . Cholecystectomy  1994  . Knee arthroscopy  LEFT X3 (LAST ONE 2005)  . Carpal tunnel release  RIGHT - 2001  & DEC 2011 W/ BACK SURG.  . Knee arthroscopy  05/17/2011    Procedure: ARTHROSCOPY KNEE;  Surgeon: Bradley Ferris III;  Location: [place];  Service: Orthopedics;  Laterality: Right;  WITH MEDIAL MENISECTOMY AND removal of suprapatella fat lump  . Anterior cervical decomp/discectomy fusion N/A 08/09/2012    Procedure: ANTERIOR CERVICAL DECOMPRESSION/DISCECTOMY FUSION 1 LEVEL;  Surgeon: Floyce Stakes, MD;  Location: [facility];  Service: Neurosurgery;  Laterality: N/A;  Cervical four-five  Anterior cervical decompression/diskectomy/fusion  . Finger arthrodesis Right 04/07/2013    Procedure: RIGHT INDEX AND RIGHT LONG DISTAL INTERPHALANGEAL JOINT FUSIONS;  Surgeon: Schuyler Amor, MD;  Location: [place];  Service: Orthopedics;  Laterality: Right;  . Finger arthroplasty Right 04/07/2013    Procedure: RIGHT THUMB [place] ARTHROPLASTY;  Surgeon: Schuyler Amor, MD;  Location: [place];  Service: Orthopedics;  Laterality: Right;  . Dorsal compartment release Right 04/07/2013    Procedure: RIGHT WRIST STS RELEASE;  Surgeon: Schuyler Amor, MD;  Location: [place];  Service: Orthopedics;  Laterality: Right;  . Ganglion cyst excision Right 04/07/2013    Procedure: RIGHT WRIST MASS EXCISION;  Surgeon: Schuyler Amor,  MD;  Location: [place];  Service: Orthopedics;  Laterality: Right;   Past Medical History  Diagnosis Date  . Osteoporosis   . Varicosities     venous  . Cardiomyopathy HX --06/2010    EF was 25% during acute illness (PHELONEPHRITIS) Repeat echo 12-06-10 60% showed normal EF.   Marland Kitchen Hypertension   . Headache(784.0)   . PONV (postoperative nausea and vomiting)   . Heart murmur     DENIES S & S   (ECHO JUN'12 W/ CHART)  . History of chronic bronchitis   . Chronic back pain greater than 3 months duration     S/P BACK SURG'S  . Arachnoiditis BILATERAL LEGS    DUE TO MULTIPLE BACK SURG.'S  . Weakness of both legs DUE TO ARACHNOIDITIS    OCCASIONAL USES CANE  . Blood transfusion   . GERD (gastroesophageal reflux disease) AND HIATIAL HERNIA    CONTROLLED W/ NEXIUM  . Acute sinusitis, unspecified   . Anxiety state, unspecified   . Essential hypertension, benign   . Dyslipidemia   . Other malaise and fatigue   . Murmur, heart   . Shortness of breath   . Neuromuscular disorder (HCC)     numbness and tingling  . Arthritis   . Hx of bladder infections   . Dysphagia     some post op cerv fusion 2/14  . Asthma    BP 128/67 mmHg  Pulse 67  Resp 14  SpO2 100%  Opioid Risk Score:   Fall Risk Score:  `1  Depression screen PHQ 2/9  Depression screen [facility] 2/9 07/05/2015 03/08/2015 01/06/2015 09/16/2014  Decreased Interest 1 1 1 1   Down, Depressed, Hopeless 1 1 1 1   PHQ - 2 Score 2 2 2 2   Altered sleeping - - - 2  Tired, decreased energy - - - 2  Change in appetite - - - 1  Feeling bad or failure about yourself  - - - 1  Trouble concentrating - - - 2  Moving slowly or fidgety/restless - - - 0  Suicidal thoughts - - - 0  PHQ-9 Score - - - 10     Review of Systems  Constitutional: Positive for fever and chills.  Respiratory: Positive for cough, shortness of breath and wheezing.   Gastrointestinal: Positive for nausea, vomiting, abdominal pain and diarrhea.    Endocrine:       High blood sugar Low blood sugar  All other systems reviewed and are negative.      Objective:   Physical Exam  Constitutional: She is oriented to person, place, and time. She appears well-developed and well-nourished.  HENT:  Head: Normocephalic and atraumatic.  Neck: Normal range  of motion. Neck supple.  Cardiovascular: Normal rate and regular rhythm.   Pulmonary/Chest: Effort normal and breath sounds normal.  Musculoskeletal:  Normal Muscle Bulk and Muscle Testing Reveals: Upper Extremities: Full ROM and Muscle Strength 5/5 Thoracic Paraspinal Tenderness: T-1- T-3, T-9- T-11 Lumbar Hypersensitivity Lower Extremities: Full ROM and Muscle Strength 5/5 Right: Lower extremity Flexion Produces Pain into Extremity Arises from chair with ease Narrow Based Gait  Neurological: She is alert and oriented to person, place, and time.  Skin: Skin is warm and dry.  Psychiatric: She has a normal mood and affect.  Nursing note and vitals reviewed.         Assessment & Plan:  1. 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  Refilled: Oxycodone 15mg  one tablet every 6 hours as needed #120 and Opana 40 mg one tablet every 12 hours  # 60 . Second script given for the following month. 2. 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 Dr. Joya Salm Following.. Dr. Joya Salm ordered a Myelogram . Continue Gabapentin 600mg  TID. Encourage Posture and Stretching Exercise.  3. Anxiety/depression: Continue Valium and monitor.  4. Muscle Spasms: Continue to Monitor 5. Neck Pain/ Radiculopath: No complaints today.Dr. Joya Salm Following   20 minutes of face to face patient care time was spent during this visit. All questions were encouraged and answered.   F/U in 2 month

## 2015-09-04 LAB — TOXASSURE SELECT,+ANTIDEPR,UR: PDF: 0

## 2015-09-09 NOTE — Progress Notes (Signed)
Urine drug screen for this encounter is consistent for prescribed medication 

## 2015-09-10 ENCOUNTER — Ambulatory Visit: Payer: Medicare Other | Admitting: Internal Medicine

## 2015-09-15 DIAGNOSIS — J4541 Moderate persistent asthma with (acute) exacerbation: Secondary | ICD-10-CM | POA: Diagnosis not present

## 2015-09-15 DIAGNOSIS — I10 Essential (primary) hypertension: Secondary | ICD-10-CM | POA: Diagnosis not present

## 2015-09-15 DIAGNOSIS — E1165 Type 2 diabetes mellitus with hyperglycemia: Secondary | ICD-10-CM | POA: Diagnosis not present

## 2015-09-23 ENCOUNTER — Ambulatory Visit: Payer: Medicare Other | Admitting: Internal Medicine

## 2015-09-29 ENCOUNTER — Ambulatory Visit (INDEPENDENT_AMBULATORY_CARE_PROVIDER_SITE_OTHER): Payer: Medicare Other | Admitting: Acute Care

## 2015-09-29 ENCOUNTER — Encounter: Payer: Self-pay | Admitting: Acute Care

## 2015-09-29 VITALS — BP 124/82 | HR 67 | Ht 65.0 in | Wt 175.0 lb

## 2015-09-29 DIAGNOSIS — J4530 Mild persistent asthma, uncomplicated: Secondary | ICD-10-CM

## 2015-09-29 NOTE — Assessment & Plan Note (Signed)
Moderate persistent Asthma check up/ Doing well Plan: Continue your Symbicort 2 puffs twice daily Rinse mouth after use Continue your Xopenex Nebs as needed Continue your xopenex inhaler as needed. Continue your Zyrtec for allergies. Use Flonase as needed. Follow up with Dr. Chase Caller in 6 months. Discuss follow up CT imaging for nodules. Please contact office for sooner follow up if symptoms do not improve or worsen or seek emergency care

## 2015-09-29 NOTE — Progress Notes (Signed)
Subjective:    Patient ID: [name] [name], female    DOB: September 10, 1950, 65 y.o.   MRN: [id number]  HPI 65 year old female. Referred for dyspnea/asthma/ 13mm pulmonary nodules. Wondering if she has passive smoking related lung disease (husband and son).  Tests: Walking desaturation test on 04/23/2012 185 feet x 3 laps: did NOT desaturate. Rest pulse ox was 97%, final pulse ox was 94%. HR response 84/min at rest to 86/min at peak exertion.  LABS - cxr sept 2012: clear lung fields - hgb nov 2012: 11.7gm% - cardiac 04/01/12: Normal stress test with normal EF - no hx of PFTs - positive methacholine challenge test  09/29/2015: Patient presents to the office for her 3 month follow up with Dr. Chase Caller. She is doing well. She has had seasonal allergies and viral illnesses that her PCP has been managing in both February and March. She is well now and at her baseline. She would like to have additional imaging surveillance of her pulmonary nodules given her strong family history of cancer. She denies chest pain, orthopnea, hemoptysis , shortness of breath or wheezing.   Current outpatient prescriptions:  .  alendronate (FOSAMAX) 70 MG tablet, Take 70 mg by mouth once a week. Take with a full glass of water on an empty stomach., Disp: , Rfl:  .  amitriptyline (ELAVIL) 100 MG tablet, TAKE 1 TABLET BY MOUTH AT BEDTIME, Disp: 30 tablet, Rfl: 4 .  aspirin EC 81 MG tablet, Take 81 mg by mouth See admin instructions. Every other day., Disp: , Rfl:  .  atenolol-chlorthalidone (TENORETIC) 50-25 MG per tablet, Take 1 tablet by mouth daily., Disp: , Rfl:  .  azelastine (ASTELIN) 0.1 % nasal spray, Place 2 sprays into both nostrils daily. Use in each nostril as directed, Disp: 30 mL, Rfl: 5 .  Biotin 10 MG CAPS, Take 10 mg by mouth daily., Disp: , Rfl:  .  budesonide-formoterol (SYMBICORT) 80-4.5 MCG/ACT inhaler, Inhale 2 puffs into the lungs 2 (two) times daily., Disp: 1 Inhaler, Rfl: 6 .  Calcium  Carbonate-Vit D-Min 1200-1000 MG-UNIT CHEW, Chew 1 tablet by mouth 2 (two) times daily. , Disp: , Rfl:  .  diazepam (VALIUM) 10 MG tablet, Take 10 mg by mouth 3 (three) times daily as needed (muscle spasms). , Disp: , Rfl:  .  esomeprazole (NEXIUM) 40 MG capsule, Take 40 mg by mouth every morning. , Disp: , Rfl:  .  fluticasone (FLONASE) 50 MCG/ACT nasal spray, Place 2 sprays into both nostrils daily., Disp: 16 g, Rfl: 2 .  gabapentin (NEURONTIN) 600 MG tablet, Take 1 tablet (600 mg total) by mouth 3 (three) times daily., Disp: 90 tablet, Rfl: 2 .  JINTELI 1-5 MG-MCG TABS tablet, , Disp: , Rfl:  .  levalbuterol (XOPENEX HFA) 45 MCG/ACT inhaler, Inhale 1-2 puffs into the lungs every 4 (four) hours as needed. For shortness of breath, Disp: 1 Inhaler, Rfl: 6 .  lidocaine (XYLOCAINE) 5 % ointment, Apply as needed for back pain, Disp: , Rfl:  .  losartan (COZAAR) 50 MG tablet, Take 1 tablet by mouth daily., Disp: , Rfl:  .  metFORMIN (GLUCOPHAGE) 500 MG tablet, Take 500 mg by mouth 2 (two) times daily with a meal. , Disp: , Rfl:  .  oxyCODONE (ROXICODONE) 15 MG immediate release tablet, Take 1 tablet (15 mg total) by mouth every 6 (six) hours as needed. For pain, Disp: 120 tablet, Rfl: 0 .  oxymorphone (OPANA ER) 40 MG 12 hr  tablet, Take 1 tablet (40 mg total) by mouth every 12 (twelve) hours., Disp: 60 tablet, Rfl: 0 .  simvastatin (ZOCOR) 10 MG tablet, Take 1 tablet by mouth at bedtime., Disp: , Rfl:  .  valACYclovir (VALTREX) 500 MG tablet, Take 1,000 mg by mouth daily. , Disp: , Rfl:  .  venlafaxine XR (EFFEXOR-XR) 75 MG 24 hr capsule, Take 1 capsule by mouth daily., Disp: , Rfl:  .  vitamin B-12 (CYANOCOBALAMIN) 1000 MCG tablet, Take 1,000 mcg by mouth daily. , Disp: , Rfl:  No current facility-administered medications for this visit.  Facility-Administered Medications Ordered in Other Visits:  .  levalbuterol (XOPENEX) nebulizer solution 0.63 mg, 0.63 mg, Nebulization, Once, Melvenia Needles, NP     Past Medical History  Diagnosis Date  . Osteoporosis   . Varicosities     venous  . Cardiomyopathy HX --06/2010    EF was 25% during acute illness (PHELONEPHRITIS) Repeat echo 12-06-10 60% showed normal EF.   Marland Kitchen Hypertension   . Headache(784.0)   . PONV (postoperative nausea and vomiting)   . Heart murmur     DENIES S & S   (ECHO JUN'12 W/ CHART)  . History of chronic bronchitis   . Chronic back pain greater than 3 months duration     S/P BACK SURG'S  . Arachnoiditis BILATERAL LEGS    DUE TO MULTIPLE BACK SURG.'S  . Weakness of both legs DUE TO ARACHNOIDITIS    OCCASIONAL USES CANE  . Blood transfusion   . GERD (gastroesophageal reflux disease) AND HIATIAL HERNIA    CONTROLLED W/ NEXIUM  . Acute sinusitis, unspecified   . Anxiety state, unspecified   . Essential hypertension, benign   . Dyslipidemia   . Other malaise and fatigue   . Murmur, heart   . Shortness of breath   . Neuromuscular disorder (HCC)     numbness and tingling  . Arthritis   . Hx of bladder infections   . Dysphagia     some post op cerv fusion 2/14  . Asthma     Allergies  Allergen Reactions  . Latex Itching and Rash  . Morphine And Related Hives and Itching  . Aspirin Nausea And Vomiting    Can take coated asa  . Biaxin [Clarithromycin] Nausea And Vomiting  . Clarithromycin Diarrhea  . Codeine Itching  . Darvocet [Propoxyphene N-Acetaminophen] Itching  . Lortab [Hydrocodone-Acetaminophen] Itching  . Percocet [Oxycodone-Acetaminophen] Itching   Review of Systems Constitutional:   No  weight loss, night sweats,  Fevers, chills, fatigue, or  lassitude.  HEENT:   No headaches,  Difficulty swallowing,  Tooth/dental problems, or  Sore throat,                No sneezing, itching, ear ache, nasal congestion,+ post nasal drip( allergies)  CV:  No chest pain,  Orthopnea, PND, swelling in lower extremities, anasarca, dizziness, palpitations, syncope.   GI  No heartburn, indigestion, abdominal  pain, nausea, vomiting, diarrhea, change in bowel habits, loss of appetite, bloody stools.   Resp: No shortness of breath with exertion or at rest.  No excess mucus, no productive cough,  No non-productive cough,  No coughing up of blood.  No change in color of mucus.  No wheezing.  No chest wall deformity  Skin: no rash or lesions.  GU: no dysuria, change in color of urine, no urgency or frequency.  No flank pain, no hematuria   MS:  No joint pain or swelling.  No decreased range of motion.  No back pain.  Psych:  No change in mood or affect. No depression or anxiety.  No memory loss.        Objective:   Physical Exam BP 124/82 mmHg  Pulse 67  Ht 5\' 5"  (1.651 m)  Wt 175 lb (79.379 kg)  BMI 29.12 kg/m2  SpO2 99%  Physical Exam:  General- No distress,  A&Ox3 ENT: No sinus tenderness, TM clear, pale nasal mucosa, no oral exudate,+ post nasal drip, no LAN Cardiac: S1, S2, regular rate and rhythm, no murmur Chest: No wheeze/ rales/ dullness; no accessory muscle use, no nasal flaring, no sternal retractions Abd.: Soft Non-tender Ext: No clubbing cyanosis, edema Neuro:  normal strength Skin: No rashes, warm and dry Psych: normal mood and behavior  Magdalen Spatz, AGACNP-BC [place] Pager # [phone] 09/29/2015    Assessment & Plan:

## 2015-09-29 NOTE — Patient Instructions (Addendum)
It is nice to meet you today. I am glad you are doing well. Continue your Symbicort 2 puffs twice daily Rinse mouth after use Continue your Xopenex Nebs as needed Continue your xopenex inhaler as needed. Continue your Zyrtec for allergies. Use Flonase as needed. Follow up with Dr. Chase Caller in 6 months. Discuss follow up CT imaging for nodules. Please contact office for sooner follow up if symptoms do not improve or worsen or seek emergency care

## 2015-09-30 ENCOUNTER — Other Ambulatory Visit: Payer: Self-pay

## 2015-09-30 MED ORDER — GABAPENTIN 600 MG PO TABS: 600.0000 mg | ORAL_TABLET | Freq: Three times a day (TID) | ORAL | Status: DC

## 2015-10-04 ENCOUNTER — Other Ambulatory Visit: Payer: Self-pay | Admitting: Registered Nurse

## 2015-10-29 ENCOUNTER — Encounter: Payer: Self-pay | Admitting: Registered Nurse

## 2015-10-29 ENCOUNTER — Encounter: Payer: Medicare Other | Attending: Physical Medicine & Rehabilitation | Admitting: Registered Nurse

## 2015-10-29 VITALS — BP 133/96 | HR 69 | Resp 14

## 2015-10-29 DIAGNOSIS — Z5181 Encounter for therapeutic drug level monitoring: Secondary | ICD-10-CM | POA: Insufficient documentation

## 2015-10-29 DIAGNOSIS — Z79899 Other long term (current) drug therapy: Secondary | ICD-10-CM | POA: Diagnosis not present

## 2015-10-29 DIAGNOSIS — M5441 Lumbago with sciatica, right side: Secondary | ICD-10-CM | POA: Diagnosis not present

## 2015-10-29 DIAGNOSIS — G039 Meningitis, unspecified: Secondary | ICD-10-CM | POA: Diagnosis not present

## 2015-10-29 DIAGNOSIS — G894 Chronic pain syndrome: Secondary | ICD-10-CM

## 2015-10-29 DIAGNOSIS — M961 Postlaminectomy syndrome, not elsewhere classified: Secondary | ICD-10-CM | POA: Diagnosis not present

## 2015-10-29 MED ORDER — OXYMORPHONE HCL ER 40 MG PO TB12: 40.0000 mg | ORAL_TABLET | Freq: Two times a day (BID) | ORAL | Status: DC

## 2015-10-29 MED ORDER — OXYCODONE HCL 15 MG PO TABS: 15.0000 mg | ORAL_TABLET | Freq: Four times a day (QID) | ORAL | Status: DC | PRN

## 2015-10-29 MED ORDER — AMITRIPTYLINE HCL 100 MG PO TABS: 100.0000 mg | ORAL_TABLET | Freq: Every day | ORAL | Status: DC

## 2015-10-29 NOTE — Progress Notes (Signed)
Subjective:    Patient ID: [name] [name], female    DOB: 1951/02/13, 65 y.o.   MRN: [id number]  HPI: [name] is a 65 year old female who returns for follow up for chronic pain and medication refill. She states her pain is located in her mid-lower back radiating into her right lower extremity laterally. She rates her pain 7. Her current exercise regime is walking and performing stretching exercises.   Pain Inventory Average Pain 8 Pain Right Now 7 My pain is sharp, burning, stabbing, tingling and aching  In the last 24 hours, has pain interfered with the following? General activity 7 Relation with others 7 Enjoyment of life 7 What TIME of day is your pain at its worst? morning, daytime, evening, night Sleep (in general) Fair  Pain is worse with: walking, sitting, standing and some activites Pain improves with: rest, heat/ice and medication Relief from Meds: 7  Mobility use a cane ability to climb steps?  yes do you drive?  yes  Function Do you have any goals in this area?  no  Neuro/Psych No problems in this area  Prior Studies Any changes since last visit?  no  Physicians involved in your care Any changes since last visit?  no   Family History  Problem Relation Age of Onset  . Hypertension Mother   . Heart disease Mother   . Diabetes Mother   . Cancer Father   . Diabetes Brother    Social History   Social History  . Marital Status: Married    Spouse Name: N/A  . Number of Children: N/A  . Years of Education: N/A   Occupational History  . disable    Social History Main Topics  . Smoking status: Never Smoker   . Smokeless tobacco: Never Used  . Alcohol Use: No  . Drug Use: No  . Sexual Activity: Not Asked     Comment: second hand smoke   Other Topics Concern  . None   Social History Narrative   Has one child   Disabled   Past Surgical History  Procedure Laterality Date  . Lumbar fusion  11-30-10    L4 - 5  . Exploration of  incision for csf leak  DEC 2011  X2    POST LAMINECOTMY  . Lumbar laminectomy  DEC 2011    L4 - 5  . Lumbar fusion  2000    L2 - 4  . Cervical disc surgery  2005    C5 - 6  . Anterior fusion cervical spine  2007    C5 -6  . Tendon repair  JAN 2010    LEFT INDEX AND LONG FINGERS  . Left wrist tenosynectomy w/ left thumb joint repair  02-24-10  . Abdominal hysterectomy  1987    W/ BSO  . Cholecystectomy  1994  . Knee arthroscopy  LEFT X3 (LAST ONE 2005)  . Carpal tunnel release  RIGHT - 2001  & DEC 2011 W/ BACK SURG.  . Knee arthroscopy  05/17/2011    Procedure: ARTHROSCOPY KNEE;  Surgeon: Bradley Ferris III;  Location: [facility];  Service: Orthopedics;  Laterality: Right;  WITH MEDIAL MENISECTOMY AND removal of suprapatella fat lump  . Anterior cervical decomp/discectomy fusion N/A 08/09/2012    Procedure: ANTERIOR CERVICAL DECOMPRESSION/DISCECTOMY FUSION 1 LEVEL;  Surgeon: Floyce Stakes, MD;  Location: [facility];  Service: Neurosurgery;  Laterality: N/A;  Cervical four-five  Anterior cervical decompression/diskectomy/fusion  .  Finger arthrodesis Right 04/07/2013    Procedure: RIGHT INDEX AND RIGHT LONG DISTAL INTERPHALANGEAL JOINT FUSIONS;  Surgeon: Schuyler Amor, MD;  Location: [place];  Service: Orthopedics;  Laterality: Right;  . Finger arthroplasty Right 04/07/2013    Procedure: RIGHT THUMB [place] ARTHROPLASTY;  Surgeon: Schuyler Amor, MD;  Location: [facility];  Service: Orthopedics;  Laterality: Right;  . Dorsal compartment release Right 04/07/2013    Procedure: RIGHT WRIST STS RELEASE;  Surgeon: Schuyler Amor, MD;  Location: [facility];  Service: Orthopedics;  Laterality: Right;  . Ganglion cyst excision Right 04/07/2013    Procedure: RIGHT WRIST MASS EXCISION;  Surgeon: Schuyler Amor, MD;  Location: [facility];  Service: Orthopedics;  Laterality: Right;   Past Medical History    Diagnosis Date  . Osteoporosis   . Varicosities     venous  . Cardiomyopathy HX --06/2010    EF was 25% during acute illness (PHELONEPHRITIS) Repeat echo 12-06-10 60% showed normal EF.   Marland Kitchen Hypertension   . Headache(784.0)   . PONV (postoperative nausea and vomiting)   . Heart murmur     DENIES S & S   (ECHO JUN'12 W/ CHART)  . History of chronic bronchitis   . Chronic back pain greater than 3 months duration     S/P BACK SURG'S  . Arachnoiditis BILATERAL LEGS    DUE TO MULTIPLE BACK SURG.'S  . Weakness of both legs DUE TO ARACHNOIDITIS    OCCASIONAL USES CANE  . Blood transfusion   . GERD (gastroesophageal reflux disease) AND HIATIAL HERNIA    CONTROLLED W/ NEXIUM  . Acute sinusitis, unspecified   . Anxiety state, unspecified   . Essential hypertension, benign   . Dyslipidemia   . Other malaise and fatigue   . Murmur, heart   . Shortness of breath   . Neuromuscular disorder (HCC)     numbness and tingling  . Arthritis   . Hx of bladder infections   . Dysphagia     some post op cerv fusion 2/14  . Asthma    BP 133/96 mmHg  Pulse 69  Resp 14  SpO2 98%  Opioid Risk Score:   Fall Risk Score:  `1  Depression screen PHQ 2/9  Depression screen [facility] 2/9 07/05/2015 03/08/2015 01/06/2015 09/16/2014  Decreased Interest 1 1 1 1   Down, Depressed, Hopeless 1 1 1 1   PHQ - 2 Score 2 2 2 2   Altered sleeping - - - 2  Tired, decreased energy - - - 2  Change in appetite - - - 1  Feeling bad or failure about yourself  - - - 1  Trouble concentrating - - - 2  Moving slowly or fidgety/restless - - - 0  Suicidal thoughts - - - 0  PHQ-9 Score - - - 10     Review of Systems  All other systems reviewed and are negative.      Objective:   Physical Exam  Constitutional: She is oriented to person, place, and time. She appears well-developed and well-nourished.  HENT:  Head: Normocephalic and atraumatic.  Neck: Normal range of motion. Neck supple.  Cardiovascular: Normal rate and  regular rhythm.   Pulmonary/Chest: Effort normal and breath sounds normal.  Musculoskeletal:  Normal Muscle Bulk and Muscle Testing Reveals: Upper Extremities: Full ROM and Muscle Strength 5/5 Thoracic Paraspinal Tenderness: T-1- T-3 Lumbar Paraspinal Tenderness: L-3- L-5 Lower Extremities: Full ROM and Muscle Strength 5/5  Arises from chair with ease Narrow Based Gait   Neurological: She is alert and oriented to person, place, and time.  Skin: Skin is warm and dry.  Psychiatric: She has a normal mood and affect.  Nursing note and vitals reviewed.         Assessment & Plan:  1. 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  Refilled: Oxycodone 15mg  one tablet every 6 hours as needed #120 and Opana 40 mg one tablet every 12 hours  # 60 . Second script given for the following month. We will continue the opioid monitoring program, this consists of regular clinic visits, examinations, urine drug screen, pill counts as well as use of [place] Controlled Substance Reporting System. 2. 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 Dr. Joya Salm Following. Continue Gabapentin 600mg  TID. Encourage Posture and Stretching Exercise.  3. Anxiety/depression: Continue Valium and monitor.  4. Muscle Spasms: Continue to Monitor 5. Neck Pain/ Radiculopathy: Dr. Joya Salm Following  20 minutes of face to face patient care time was spent during this visit. All questions were encouraged and answered.   F/U in 2 month

## 2015-11-01 ENCOUNTER — Other Ambulatory Visit: Payer: Self-pay | Admitting: Internal Medicine

## 2015-11-01 ENCOUNTER — Ambulatory Visit: Payer: Medicare Other | Admitting: Registered Nurse

## 2015-12-02 DIAGNOSIS — J441 Chronic obstructive pulmonary disease with (acute) exacerbation: Secondary | ICD-10-CM | POA: Diagnosis not present

## 2015-12-02 DIAGNOSIS — I10 Essential (primary) hypertension: Secondary | ICD-10-CM | POA: Diagnosis not present

## 2015-12-02 DIAGNOSIS — E1165 Type 2 diabetes mellitus with hyperglycemia: Secondary | ICD-10-CM | POA: Diagnosis not present

## 2015-12-17 DIAGNOSIS — E1165 Type 2 diabetes mellitus with hyperglycemia: Secondary | ICD-10-CM | POA: Diagnosis not present

## 2015-12-17 DIAGNOSIS — I10 Essential (primary) hypertension: Secondary | ICD-10-CM | POA: Diagnosis not present

## 2015-12-17 DIAGNOSIS — J4541 Moderate persistent asthma with (acute) exacerbation: Secondary | ICD-10-CM | POA: Diagnosis not present

## 2015-12-17 DIAGNOSIS — R5383 Other fatigue: Secondary | ICD-10-CM | POA: Diagnosis not present

## 2015-12-20 ENCOUNTER — Encounter: Payer: Medicare Other | Attending: Physical Medicine & Rehabilitation | Admitting: Physical Medicine & Rehabilitation

## 2015-12-20 ENCOUNTER — Encounter: Payer: Self-pay | Admitting: Physical Medicine & Rehabilitation

## 2015-12-20 VITALS — BP 137/82 | HR 68 | Resp 16

## 2015-12-20 DIAGNOSIS — G039 Meningitis, unspecified: Secondary | ICD-10-CM

## 2015-12-20 DIAGNOSIS — Z79899 Other long term (current) drug therapy: Secondary | ICD-10-CM | POA: Insufficient documentation

## 2015-12-20 DIAGNOSIS — M5441 Lumbago with sciatica, right side: Secondary | ICD-10-CM | POA: Diagnosis not present

## 2015-12-20 DIAGNOSIS — M961 Postlaminectomy syndrome, not elsewhere classified: Secondary | ICD-10-CM

## 2015-12-20 DIAGNOSIS — Z5181 Encounter for therapeutic drug level monitoring: Secondary | ICD-10-CM | POA: Insufficient documentation

## 2015-12-20 MED ORDER — OXYMORPHONE HCL ER 40 MG PO TB12: 40.0000 mg | ORAL_TABLET | Freq: Two times a day (BID) | ORAL | Status: DC

## 2015-12-20 MED ORDER — OXYCODONE HCL 15 MG PO TABS: 15.0000 mg | ORAL_TABLET | Freq: Four times a day (QID) | ORAL | Status: DC | PRN

## 2015-12-20 NOTE — Progress Notes (Signed)
Subjective:    Patient ID: [name] [name], female    DOB: 07-16-1950, 65 y.o.   MRN: [id number]  HPI  [name] [name] is here in follow up of her chronic pain. Her pain levels have remained generally consistent. She notes more frequent pain in the left side of her neck which radiates into the left occiput and into the left arm. It bothers her when she turns to the left. It has been occuring for the last 5 months or so. She hasn't mentioned anything before today. She also feels a "knot in her neck. Her low back continues to be a problem. She uses a TENS unit with good results.   Her opana and oxycodone remain effective for pain control. She is a little nervous given the pending removal of Opana from the market.   Pain Inventory Average Pain 8 Pain Right Now 8 My pain is sharp, burning, stabbing, tingling and aching  In the last 24 hours, has pain interfered with the following? General activity 7 Relation with others 7 Enjoyment of life 7 What TIME of day is your pain at its worst? all Sleep (in general) Fair  Pain is worse with: walking, sitting, inactivity, standing and some activites Pain improves with: rest, heat/ice and medication Relief from Meds: 7  Mobility ability to climb steps?  yes do you drive?  yes  Function Do you have any goals in this area?  no  Neuro/Psych No problems in this area  Prior Studies Any changes since last visit?  no  Physicians involved in your care Any changes since last visit?  no   Family History  Problem Relation Age of Onset  . Hypertension Mother   . Heart disease Mother   . Diabetes Mother   . Cancer Father   . Diabetes Brother    Social History   Social History  . Marital Status: Married    Spouse Name: N/A  . Number of Children: N/A  . Years of Education: N/A   Occupational History  . disable    Social History Main Topics  . Smoking status: Never Smoker   . Smokeless tobacco: Never Used  . Alcohol Use: No  . Drug Use:  No  . Sexual Activity: Not Asked     Comment: second hand smoke   Other Topics Concern  . None   Social History Narrative   Has one child   Disabled   Past Surgical History  Procedure Laterality Date  . Lumbar fusion  11-30-10    L4 - 5  . Exploration of incision for csf leak  DEC 2011  X2    POST LAMINECOTMY  . Lumbar laminectomy  DEC 2011    L4 - 5  . Lumbar fusion  2000    L2 - 4  . Cervical disc surgery  2005    C5 - 6  . Anterior fusion cervical spine  2007    C5 -6  . Tendon repair  JAN 2010    LEFT INDEX AND LONG FINGERS  . Left wrist tenosynectomy w/ left thumb joint repair  02-24-10  . Abdominal hysterectomy  1987    W/ BSO  . Cholecystectomy  1994  . Knee arthroscopy  LEFT X3 (LAST ONE 2005)  . Carpal tunnel release  RIGHT - 2001  & DEC 2011 W/ BACK SURG.  . Knee arthroscopy  05/17/2011    Procedure: ARTHROSCOPY KNEE;  Surgeon: Karen Chafe Rendall III;  Location: [place];  Service: Orthopedics;  Laterality: Right;  WITH MEDIAL MENISECTOMY AND removal of suprapatella fat lump  . Anterior cervical decomp/discectomy fusion N/A 08/09/2012    Procedure: ANTERIOR CERVICAL DECOMPRESSION/DISCECTOMY FUSION 1 LEVEL;  Surgeon: Floyce Stakes, MD;  Location: [facility];  Service: Neurosurgery;  Laterality: N/A;  Cervical four-five  Anterior cervical decompression/diskectomy/fusion  . Finger arthrodesis Right 04/07/2013    Procedure: RIGHT INDEX AND RIGHT LONG DISTAL INTERPHALANGEAL JOINT FUSIONS;  Surgeon: Schuyler Amor, MD;  Location: [place];  Service: Orthopedics;  Laterality: Right;  . Finger arthroplasty Right 04/07/2013    Procedure: RIGHT THUMB [place] ARTHROPLASTY;  Surgeon: Schuyler Amor, MD;  Location: [place];  Service: Orthopedics;  Laterality: Right;  . Dorsal compartment release Right 04/07/2013    Procedure: RIGHT WRIST STS RELEASE;  Surgeon: Schuyler Amor, MD;  Location: [place];   Service: Orthopedics;  Laterality: Right;  . Ganglion cyst excision Right 04/07/2013    Procedure: RIGHT WRIST MASS EXCISION;  Surgeon: Schuyler Amor, MD;  Location: [place];  Service: Orthopedics;  Laterality: Right;   Past Medical History  Diagnosis Date  . Osteoporosis   . Varicosities     venous  . Cardiomyopathy HX --06/2010    EF was 25% during acute illness (PHELONEPHRITIS) Repeat echo 12-06-10 60% showed normal EF.   Marland Kitchen Hypertension   . Headache(784.0)   . PONV (postoperative nausea and vomiting)   . Heart murmur     DENIES S & S   (ECHO JUN'12 W/ CHART)  . History of chronic bronchitis   . Chronic back pain greater than 3 months duration     S/P BACK SURG'S  . Arachnoiditis BILATERAL LEGS    DUE TO MULTIPLE BACK SURG.'S  . Weakness of both legs DUE TO ARACHNOIDITIS    OCCASIONAL USES CANE  . Blood transfusion   . GERD (gastroesophageal reflux disease) AND HIATIAL HERNIA    CONTROLLED W/ NEXIUM  . Acute sinusitis, unspecified   . Anxiety state, unspecified   . Essential hypertension, benign   . Dyslipidemia   . Other malaise and fatigue   . Murmur, heart   . Shortness of breath   . Neuromuscular disorder (HCC)     numbness and tingling  . Arthritis   . Hx of bladder infections   . Dysphagia     some post op cerv fusion 2/14  . Asthma    BP 137/82 mmHg  Pulse 68  Resp 16  SpO2 99%  Opioid Risk Score:   Fall Risk Score:  `1  Depression screen PHQ 2/9  Depression screen [facility] 2/9 12/20/2015 07/05/2015 03/08/2015 01/06/2015 09/16/2014  Decreased Interest 0 1 1 1 1   Down, Depressed, Hopeless 0 1 1 1 1   PHQ - 2 Score 0 2 2 2 2   Altered sleeping - - - - 2  Tired, decreased energy - - - - 2  Change in appetite - - - - 1  Feeling bad or failure about yourself  - - - - 1  Trouble concentrating - - - - 2  Moving slowly or fidgety/restless - - - - 0  Suicidal thoughts - - - - 0  PHQ-9 Score - - - - 10      Review of Systems    Constitutional: Negative.   HENT: Negative.   Respiratory: Negative.   Genitourinary: Negative.   Musculoskeletal: Negative.   Hematological: Negative.   Psychiatric/Behavioral: Negative.  All other systems reviewed and are negative.      Objective:   Physical Exam  Constitutional: She is oriented to person, place, and time. She appears well-developed and well-nourished.  HENT: hoarse voice  Head: Normocephalic and atraumatic.  Eyes: Conjunctivae and EOM are normal. Pupils are equal, round, and reactive to light.  Neck: Normal range of motion. Neck supple.  Cardiovascular: Normal rate and regular rhythm.  Pulmonary/Chest: Effort normal and breath sounds normal.  Abdominal: Soft. Bowel sounds are normal. She exhibits no distension.  Musculoskeletal: tenderness with palpation of both greater trochs Cervical back: She exhibits decreased range of motion and tenderness. Some atrophy of paraspinals and scar tissue noted.  Lumbar back: She exhibits decreased range of motion, tenderness, bony tenderness and spasm. She can bend at the waist to approximately 45 degrees before she begins to hurt. Neurological: She is alert and oriented to person, place, and time. No cranial nerve deficit.  strength grossly 4+/5 in UE's. SStill there are no consistent or focal sensory findings are seen in the arms. Lower extremities continues to show some mild sensory loss at 1-1+ out of 2, particularly below the knees. . Reflexes are generally trace to 1+ throughout both legs. UE DTR's are 2+. Gait is wide based and antalgic on the right more than left. She is using a cane for support.  Psychiatric: She has a normal mood and affect. Her behavior is normal. Judgment and thought content normal. She is pleasant as always   Assessment & Plan:   ASSESSMENT:  1. 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 She has documented C6 radiculitis in the past.  2. Post lumbar post  laminectomy syndrome.  3. 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 Again, I don't see progression of symptoms/disease on exam.  4. Anxiety/depression secondary to the above    PLAN:  1. Continue gabapentin 600mg  TID for nerve pain.   2. Still probably a good candidate for a spinal stimulator but she is hesitant to have anything else done  3. Continue with Opana ER to 40mg  q12 #60 with second rx for next month. May need to consider other options given that opana may be pulled from market. ?Nucynta   4. Will continue oxy IR 15mg  for breakthrough. q6 hour prn #120 with second rxfor next mont  5. Can consider caudal block to help with symptoms.  .  6. Will provide rotator cuff exercises. If left shoulder symptoms are persistent, may need injection.  7. Flexeril and valium prn for spasms  8. PENS unit trial. Will submit paperwork. She uses a TENS unit currently which is effective for short periods of time. 9. The patient will see NP in one month. All questions were encouraged and answered. I stressed the importance of lumbar and LE ROM and persistent activity to prevent further tethering and tightening of her low back and cauda equina.

## 2015-12-20 NOTE — Patient Instructions (Signed)
PLEASE CALL ME WITH ANY PROBLEMS OR QUESTIONS GU:[id number])  CONTINUE WORKING ON YOUR NECK RANGE OF MOTION.   Impingement Syndrome, Rotator Cuff, Bursitis With Rehab Impingement syndrome is a condition that involves inflammation of the tendons of the rotator cuff and the subacromial bursa, that causes pain in the shoulder. The rotator cuff consists of four tendons and muscles that control much of the shoulder and upper arm function. The subacromial bursa is a fluid filled sac that helps reduce friction between the rotator cuff and one of the bones of the shoulder (acromion). Impingement syndrome is usually an overuse injury that causes swelling of the bursa (bursitis), swelling of the tendon (tendonitis), and/or a tear of the tendon (strain). Strains are classified into three categories. Grade 1 strains cause pain, but the tendon is not lengthened. Grade 2 strains include a lengthened ligament, due to the ligament being stretched or partially ruptured. With grade 2 strains there is still function, although the function may be decreased. Grade 3 strains include a complete tear of the tendon or muscle, and function is usually impaired. SYMPTOMS   Pain around the shoulder, often at the outer portion of the upper arm.  Pain that gets worse with shoulder function, especially when reaching overhead or lifting.  Sometimes, aching when not using the arm.  Pain that wakes you up at night.  Sometimes, tenderness, swelling, warmth, or redness over the affected area.  Loss of strength.  Limited motion of the shoulder, especially reaching behind the back (to the back pocket or to unhook bra) or across your body.  Crackling sound (crepitation) when moving the arm.  Biceps tendon pain and inflammation (in the front of the shoulder). Worse when bending the elbow or lifting. CAUSES  Impingement syndrome is often an overuse injury, in which chronic (repetitive) motions cause the tendons or bursa to become  inflamed. A strain occurs when a force is paced on the tendon or muscle that is greater than it can withstand. Common mechanisms of injury include: Stress from sudden increase in duration, frequency, or intensity of training.  Direct hit (trauma) to the shoulder.  Aging, erosion of the tendon with normal use.  Bony bump on shoulder (acromial spur). RISK INCREASES WITH:  Contact sports (football, wrestling, boxing).  Throwing sports (baseball, tennis, volleyball).  Weightlifting and bodybuilding.  Heavy labor.  Previous injury to the rotator cuff, including impingement.  Poor shoulder strength and flexibility.  Failure to warm up properly before activity.  Inadequate protective equipment.  Old age.  Bony bump on shoulder (acromial spur). PREVENTION   Warm up and stretch properly before activity.  Allow for adequate recovery between workouts.  Maintain physical fitness:  Strength, flexibility, and endurance.  Cardiovascular fitness.  Learn and use proper exercise technique. PROGNOSIS  If treated properly, impingement syndrome usually goes away within 6 weeks. Sometimes surgery is required.  RELATED COMPLICATIONS   Longer healing time if not properly treated, or if not given enough time to heal.  Recurring symptoms, that result in a chronic condition.  Shoulder stiffness, frozen shoulder, or loss of motion.  Rotator cuff tendon tear.  Recurring symptoms, especially if activity is resumed too soon, with overuse, with a direct blow, or when using poor technique. TREATMENT  Treatment first involves the use of ice and medicine, to reduce pain and inflammation. The use of strengthening and stretching exercises may help reduce pain with activity. These exercises may be performed at home or with a therapist. If non-surgical treatment is unsuccessful  after more than 6 months, surgery may be advised. After surgery and rehabilitation, activity is usually possible in 3  months.  MEDICATION  If pain medicine is needed, nonsteroidal anti-inflammatory medicines (aspirin and ibuprofen), or other minor pain relievers (acetaminophen), are often advised.  Do not take pain medicine for 7 days before surgery.  Prescription pain relievers may be given, if your caregiver thinks they are needed. Use only as directed and only as much as you need.  Corticosteroid injections may be given by your caregiver. These injections should be reserved for the most serious cases, because they may only be given a certain number of times. HEAT AND COLD  Cold treatment (icing) should be applied for 10 to 15 minutes every 2 to 3 hours for inflammation and pain, and immediately after activity that aggravates your symptoms. Use ice packs or an ice massage.  Heat treatment may be used before performing stretching and strengthening activities prescribed by your caregiver, physical therapist, or athletic trainer. Use a heat pack or a warm water soak. SEEK MEDICAL CARE IF:   Symptoms get worse or do not improve in 4 to 6 weeks, despite treatment.  New, unexplained symptoms develop. (Drugs used in treatment may produce side effects.) EXERCISES  RANGE OF MOTION (ROM) AND STRETCHING EXERCISES - Impingement Syndrome (Rotator Cuff  Tendinitis, Bursitis) These exercises may help you when beginning to rehabilitate your injury. Your symptoms may go away with or without further involvement from your physician, physical therapist or athletic trainer. While completing these exercises, remember:   Restoring tissue flexibility helps normal motion to return to the joints. This allows healthier, less painful movement and activity.  An effective stretch should be held for at least 30 seconds.  A stretch should never be painful. You should only feel a gentle lengthening or release in the stretched tissue. STRETCH - Flexion, Standing  Stand with good posture. With an underhand grip on your right / left  hand, and an overhand grip on the opposite hand, grasp a broomstick or cane so that your hands are a little more than shoulder width apart.  Keeping your right / left elbow straight and shoulder muscles relaxed, push the stick with your opposite hand, to raise your right / left arm in front of your body and then overhead. Raise your arm until you feel a stretch in your right / left shoulder, but before you have increased shoulder pain.  Try to avoid shrugging your right / left shoulder as your arm rises, by keeping your shoulder blade tucked down and toward your mid-back spine. Hold for __________ seconds.  Slowly return to the starting position. Repeat __________ times. Complete this exercise __________ times per day. STRETCH - Abduction, Supine  Lie on your back. With an underhand grip on your right / left hand and an overhand grip on the opposite hand, grasp a broomstick or cane so that your hands are a little more than shoulder width apart.  Keeping your right / left elbow straight and your shoulder muscles relaxed, push the stick with your opposite hand, to raise your right / left arm out to the side of your body and then overhead. Raise your arm until you feel a stretch in your right / left shoulder, but before you have increased shoulder pain.  Try to avoid shrugging your right / left shoulder as your arm rises, by keeping your shoulder blade tucked down and toward your mid-back spine. Hold for __________ seconds.  Slowly return to the  starting position. Repeat __________ times. Complete this exercise __________ times per day. ROM - Flexion, Active-Assisted  Lie on your back. You may bend your knees for comfort.  Grasp a broomstick or cane so your hands are about shoulder width apart. Your right / left hand should grip the end of the stick, so that your hand is positioned "thumbs-up," as if you were about to shake hands.  Using your healthy arm to lead, raise your right / left arm  overhead, until you feel a gentle stretch in your shoulder. Hold for __________ seconds.  Use the stick to assist in returning your right / left arm to its starting position. Repeat __________ times. Complete this exercise __________ times per day.  ROM - Internal Rotation, Supine   Lie on your back on a firm surface. Place your right / left elbow about 60 degrees away from your side. Elevate your elbow with a folded towel, so that the elbow and shoulder are the same height.  Using a broomstick or cane and your strong arm, pull your right / left hand toward your body until you feel a gentle stretch, but no increase in your shoulder pain. Keep your shoulder and elbow in place throughout the exercise.  Hold for __________ seconds. Slowly return to the starting position. Repeat __________ times. Complete this exercise __________ times per day. STRETCH - Internal Rotation  Place your right / left hand behind your back, palm up.  Throw a towel or belt over your opposite shoulder. Grasp the towel with your right / left hand.  While keeping an upright posture, gently pull up on the towel, until you feel a stretch in the front of your right / left shoulder.  Avoid shrugging your right / left shoulder as your arm rises, by keeping your shoulder blade tucked down and toward your mid-back spine.  Hold for __________ seconds. Release the stretch, by lowering your healthy hand. Repeat __________ times. Complete this exercise __________ times per day. ROM - Internal Rotation   Using an underhand grip, grasp a stick behind your back with both hands.  While standing upright with good posture, slide the stick up your back until you feel a mild stretch in the front of your shoulder.  Hold for __________ seconds. Slowly return to your starting position. Repeat __________ times. Complete this exercise __________ times per day.  STRETCH - Posterior Shoulder Capsule   Stand or sit with good posture.  Grasp your right / left elbow and draw it across your chest, keeping it at the same height as your shoulder.  Pull your elbow, so your upper arm comes in closer to your chest. Pull until you feel a gentle stretch in the back of your shoulder.  Hold for __________ seconds. Repeat __________ times. Complete this exercise __________ times per day. STRENGTHENING EXERCISES - Impingement Syndrome (Rotator Cuff Tendinitis, Bursitis) These exercises may help you when beginning to rehabilitate your injury. They may resolve your symptoms with or without further involvement from your physician, physical therapist or athletic trainer. While completing these exercises, remember:  Muscles can gain both the endurance and the strength needed for everyday activities through controlled exercises.  Complete these exercises as instructed by your physician, physical therapist or athletic trainer. Increase the resistance and repetitions only as guided.  You may experience muscle soreness or fatigue, but the pain or discomfort you are trying to eliminate should never worsen during these exercises. If this pain does get worse, stop and make sure  you are following the directions exactly. If the pain is still present after adjustments, discontinue the exercise until you can discuss the trouble with your clinician.  During your recovery, avoid activity or exercises which involve actions that place your injured hand or elbow above your head or behind your back or head. These positions stress the tissues which you are trying to heal. STRENGTH - Scapular Depression and Adduction   With good posture, sit on a firm chair. Support your arms in front of you, with pillows, arm rests, or on a table top. Have your elbows in line with the sides of your body.  Gently draw your shoulder blades down and toward your mid-back spine. Gradually increase the tension, without tensing the muscles along the top of your shoulders and the back of  your neck.  Hold for __________ seconds. Slowly release the tension and relax your muscles completely before starting the next repetition.  After you have practiced this exercise, remove the arm support and complete the exercise in standing as well as sitting position. Repeat __________ times. Complete this exercise __________ times per day.  STRENGTH - Shoulder Abductors, Isometric  With good posture, stand or sit about 4-6 inches from a wall, with your right / left side facing the wall.  Bend your right / left elbow. Gently press your right / left elbow into the wall. Increase the pressure gradually, until you are pressing as hard as you can, without shrugging your shoulder or increasing any shoulder discomfort.  Hold for __________ seconds.  Release the tension slowly. Relax your shoulder muscles completely before you begin the next repetition. Repeat __________ times. Complete this exercise __________ times per day.  STRENGTH - External Rotators, Isometric  Keep your right / left elbow at your side and bend it 90 degrees.  Step into a door frame so that the outside of your right / left wrist can press against the door frame without your upper arm leaving your side.  Gently press your right / left wrist into the door frame, as if you were trying to swing the back of your hand away from your stomach. Gradually increase the tension, until you are pressing as hard as you can, without shrugging your shoulder or increasing any shoulder discomfort.  Hold for __________ seconds.  Release the tension slowly. Relax your shoulder muscles completely before you begin the next repetition. Repeat __________ times. Complete this exercise __________ times per day.  STRENGTH - Supraspinatus   Stand or sit with good posture. Grasp a __________ weight, or an exercise band or tubing, so that your hand is "thumbs-up," like you are shaking hands.  Slowly lift your right / left arm in a "V" away from  your thigh, diagonally into the space between your side and straight ahead. Lift your hand to shoulder height or as far as you can, without increasing any shoulder pain. At first, many people do not lift their hands above shoulder height.  Avoid shrugging your right / left shoulder as your arm rises, by keeping your shoulder blade tucked down and toward your mid-back spine.  Hold for __________ seconds. Control the descent of your hand, as you slowly return to your starting position. Repeat __________ times. Complete this exercise __________ times per day.  STRENGTH - External Rotators  Secure a rubber exercise band or tubing to a fixed object (table, pole) so that it is at the same height as your right / left elbow when you are standing or sitting  on a firm surface.  Stand or sit so that the secured exercise band is at your uninjured side.  Bend your right / left elbow 90 degrees. Place a folded towel or small pillow under your right / left arm, so that your elbow is a few inches away from your side.  Keeping the tension on the exercise band, pull it away from your body, as if pivoting on your elbow. Be sure to keep your body steady, so that the movement is coming only from your rotating shoulder.  Hold for __________ seconds. Release the tension in a controlled manner, as you return to the starting position. Repeat __________ times. Complete this exercise __________ times per day.  STRENGTH - Internal Rotators   Secure a rubber exercise band or tubing to a fixed object (table, pole) so that it is at the same height as your right / left elbow when you are standing or sitting on a firm surface.  Stand or sit so that the secured exercise band is at your right / left side.  Bend your elbow 90 degrees. Place a folded towel or small pillow under your right / left arm so that your elbow is a few inches away from your side.  Keeping the tension on the exercise band, pull it across your body,  toward your stomach. Be sure to keep your body steady, so that the movement is coming only from your rotating shoulder.  Hold for __________ seconds. Release the tension in a controlled manner, as you return to the starting position. Repeat __________ times. Complete this exercise __________ times per day.  STRENGTH - Scapular Protractors, Standing   Stand arms length away from a wall. Place your hands on the wall, keeping your elbows straight.  Begin by dropping your shoulder blades down and toward your mid-back spine.  To strengthen your protractors, keep your shoulder blades down, but slide them forward on your rib cage. It will feel as if you are lifting the back of your rib cage away from the wall. This is a subtle motion and can be challenging to complete. Ask your caregiver for further instruction, if you are not sure you are doing the exercise correctly.  Hold for __________ seconds. Slowly return to the starting position, resting the muscles completely before starting the next repetition. Repeat __________ times. Complete this exercise __________ times per day. STRENGTH - Scapular Protractors, Supine  Lie on your back on a firm surface. Extend your right / left arm straight into the air while holding a __________ weight in your hand.  Keeping your head and back in place, lift your shoulder off the floor.  Hold for __________ seconds. Slowly return to the starting position, and allow your muscles to relax completely before starting the next repetition. Repeat __________ times. Complete this exercise __________ times per day. STRENGTH - Scapular Protractors, Quadruped  Get onto your hands and knees, with your shoulders directly over your hands (or as close as you can be, comfortably).  Keeping your elbows locked, lift the back of your rib cage up into your shoulder blades, so your mid-back rounds out. Keep your neck muscles relaxed.  Hold this position for __________ seconds.  Slowly return to the starting position and allow your muscles to relax completely before starting the next repetition. Repeat __________ times. Complete this exercise __________ times per day.  STRENGTH - Scapular Retractors  Secure a rubber exercise band or tubing to a fixed object (table, pole), so that it is  at the height of your shoulders when you are either standing, or sitting on a firm armless chair.  With a palm down grip, grasp an end of the band in each hand. Straighten your elbows and lift your hands straight in front of you, at shoulder height. Step back, away from the secured end of the band, until it becomes tense.  Squeezing your shoulder blades together, draw your elbows back toward your sides, as you bend them. Keep your upper arms lifted away from your body throughout the exercise.  Hold for __________ seconds. Slowly ease the tension on the band, as you reverse the directions and return to the starting position. Repeat __________ times. Complete this exercise __________ times per day. STRENGTH - Shoulder Extensors   Secure a rubber exercise band or tubing to a fixed object (table, pole) so that it is at the height of your shoulders when you are either standing, or sitting on a firm armless chair.  With a thumbs-up grip, grasp an end of the band in each hand. Straighten your elbows and lift your hands straight in front of you, at shoulder height. Step back, away from the secured end of the band, until it becomes tense.  Squeezing your shoulder blades together, pull your hands down to the sides of your thighs. Do not allow your hands to go behind you.  Hold for __________ seconds. Slowly ease the tension on the band, as you reverse the directions and return to the starting position. Repeat __________ times. Complete this exercise __________ times per day.  STRENGTH - Scapular Retractors and External Rotators   Secure a rubber exercise band or tubing to a fixed object (table,  pole) so that it is at the height as your shoulders, when you are either standing, or sitting on a firm armless chair.  With a palm down grip, grasp an end of the band in each hand. Bend your elbows 90 degrees and lift your elbows to shoulder height, at your sides. Step back, away from the secured end of the band, until it becomes tense.  Squeezing your shoulder blades together, rotate your shoulders so that your upper arms and elbows remain stationary, but your fists travel upward to head height.  Hold for __________ seconds. Slowly ease the tension on the band, as you reverse the directions and return to the starting position. Repeat __________ times. Complete this exercise __________ times per day.  STRENGTH - Scapular Retractors and External Rotators, Rowing   Secure a rubber exercise band or tubing to a fixed object (table, pole) so that it is at the height of your shoulders, when you are either standing, or sitting on a firm armless chair.  With a palm down grip, grasp an end of the band in each hand. Straighten your elbows and lift your hands straight in front of you, at shoulder height. Step back, away from the secured end of the band, until it becomes tense.  Step 1: Squeeze your shoulder blades together. Bending your elbows, draw your hands to your chest, as if you are rowing a boat. At the end of this motion, your hands and elbow should be at shoulder height and your elbows should be out to your sides.  Step 2: Rotate your shoulders, to raise your hands above your head. Your forearms should be vertical and your upper arms should be horizontal.  Hold for __________ seconds. Slowly ease the tension on the band, as you reverse the directions and return to the starting  position. Repeat __________ times. Complete this exercise __________ times per day.  STRENGTH - Scapular Depressors  Find a sturdy chair without wheels, such as a dining room chair.  Keeping your feet on the floor, and  your hands on the chair arms, lift your bottom up from the seat, and lock your elbows.  Keeping your elbows straight, allow gravity to pull your body weight down. Your shoulders will rise toward your ears.  Raise your body against gravity by drawing your shoulder blades down your back, shortening the distance between your shoulders and ears. Although your feet should always maintain contact with the floor, your feet should progressively support less body weight, as you get stronger.  Hold for __________ seconds. In a controlled and slow manner, lower your body weight to begin the next repetition. Repeat __________ times. Complete this exercise __________ times per day.    This information is not intended to replace advice given to you by your health care provider. Make sure you discuss any questions you have with your health care provider.   Document Released: 06/05/2005 Document Revised: 06/26/2014 Document Reviewed: 09/17/2008 [employer] Interactive Patient Education [employer].

## 2015-12-22 DIAGNOSIS — J4541 Moderate persistent asthma with (acute) exacerbation: Secondary | ICD-10-CM | POA: Diagnosis not present

## 2015-12-22 DIAGNOSIS — R5383 Other fatigue: Secondary | ICD-10-CM | POA: Diagnosis not present

## 2015-12-22 DIAGNOSIS — E1165 Type 2 diabetes mellitus with hyperglycemia: Secondary | ICD-10-CM | POA: Diagnosis not present

## 2015-12-22 DIAGNOSIS — I10 Essential (primary) hypertension: Secondary | ICD-10-CM | POA: Diagnosis not present

## 2015-12-31 ENCOUNTER — Other Ambulatory Visit: Payer: Self-pay | Admitting: Registered Nurse

## 2016-01-02 DIAGNOSIS — R4182 Altered mental status, unspecified: Secondary | ICD-10-CM | POA: Diagnosis not present

## 2016-01-02 DIAGNOSIS — R55 Syncope and collapse: Secondary | ICD-10-CM | POA: Diagnosis not present

## 2016-01-02 DIAGNOSIS — E861 Hypovolemia: Secondary | ICD-10-CM | POA: Diagnosis not present

## 2016-01-06 DIAGNOSIS — I10 Essential (primary) hypertension: Secondary | ICD-10-CM | POA: Diagnosis not present

## 2016-01-06 DIAGNOSIS — E1165 Type 2 diabetes mellitus with hyperglycemia: Secondary | ICD-10-CM | POA: Diagnosis not present

## 2016-01-06 DIAGNOSIS — I951 Orthostatic hypotension: Secondary | ICD-10-CM | POA: Diagnosis not present

## 2016-01-20 ENCOUNTER — Encounter: Payer: Self-pay | Admitting: Registered Nurse

## 2016-01-20 ENCOUNTER — Encounter: Payer: Medicare Other | Attending: Physical Medicine & Rehabilitation | Admitting: Registered Nurse

## 2016-01-20 VITALS — BP 135/75 | HR 106

## 2016-01-20 DIAGNOSIS — M5441 Lumbago with sciatica, right side: Secondary | ICD-10-CM | POA: Diagnosis not present

## 2016-01-20 DIAGNOSIS — M961 Postlaminectomy syndrome, not elsewhere classified: Secondary | ICD-10-CM | POA: Insufficient documentation

## 2016-01-20 DIAGNOSIS — G894 Chronic pain syndrome: Secondary | ICD-10-CM | POA: Diagnosis not present

## 2016-01-20 DIAGNOSIS — Z5181 Encounter for therapeutic drug level monitoring: Secondary | ICD-10-CM | POA: Insufficient documentation

## 2016-01-20 DIAGNOSIS — G039 Meningitis, unspecified: Secondary | ICD-10-CM | POA: Insufficient documentation

## 2016-01-20 DIAGNOSIS — Z79899 Other long term (current) drug therapy: Secondary | ICD-10-CM | POA: Insufficient documentation

## 2016-01-20 MED ORDER — OXYCODONE HCL 15 MG PO TABS: 15.0000 mg | ORAL_TABLET | Freq: Four times a day (QID) | ORAL | 0 refills | Status: DC | PRN

## 2016-01-20 MED ORDER — OXYMORPHONE HCL ER 40 MG PO TB12: 40.0000 mg | ORAL_TABLET | Freq: Two times a day (BID) | ORAL | 0 refills | Status: DC

## 2016-01-20 NOTE — Progress Notes (Signed)
Subjective:    Patient ID: [name] [name], female    DOB: 05-Jul-1950, 65 y.o.   MRN: [id number]  HPI: [name] [name] is a 65 year old female who returns for follow up for chronic pain and medication refill. She states her pain is located in her neck radiating into her left arm and fingers and mid-lower back radiating into her right lower extremity laterally. She rates her pain 8. Her current exercise regime is swimming, walking and performing stretching exercises.  Also states while she was at the beach she was taken to [facility], EMS was called, she was seen for Syncope and collapse. States she received IV fluids.   Pain Inventory Average Pain 7 Pain Right Now 8 My pain is sharp, burning, stabbing and aching  In the last 24 hours, has pain interfered with the following? General activity 7 Relation with others 7 Enjoyment of life 7 What TIME of day is your pain at its worst? all Sleep (in general) Fair  Pain is worse with: walking, bending, sitting, standing and some activites Pain improves with: rest, heat/ice and medication Relief from Meds: 7  Mobility use a cane ability to climb steps?  yes do you drive?  yes  Function not employed: date last employed na  Neuro/Psych No problems in this area  Prior Studies Any changes since last visit?  yes  Episode at the beach with extreme dehydration with EMS called.  Physicians involved in your care Any changes since last visit?  no   Family History  Problem Relation Age of Onset  . Hypertension Mother   . Heart disease Mother   . Diabetes Mother   . Cancer Father   . Diabetes Brother    Social History   Social History  . Marital status: Married    Spouse name: N/A  . Number of children: N/A  . Years of education: N/A   Occupational History  . disable Disabled   Social History Main Topics  . Smoking status: Never Smoker  . Smokeless tobacco: Never Used  . Alcohol use No  . Drug use: No  .  Sexual activity: Not Asked     Comment: second hand smoke   Other Topics Concern  . None   Social History Narrative   Has one child   Disabled   Past Surgical History:  Procedure Laterality Date  . ABDOMINAL HYSTERECTOMY  1987   W/ BSO  . ANTERIOR CERVICAL DECOMP/DISCECTOMY FUSION N/A 08/09/2012   Procedure: ANTERIOR CERVICAL DECOMPRESSION/DISCECTOMY FUSION 1 LEVEL;  Surgeon: Floyce Stakes, MD;  Location: [facility];  Service: Neurosurgery;  Laterality: N/A;  Cervical four-five  Anterior cervical decompression/diskectomy/fusion  . ANTERIOR FUSION CERVICAL SPINE  2007   C5 -6  . CARPAL TUNNEL RELEASE  RIGHT - 2001  & DEC 2011 W/ BACK SURG.  . CERVICAL DISC SURGERY  2005   C5 - 6  . CHOLECYSTECTOMY  1994  . DORSAL COMPARTMENT RELEASE Right 04/07/2013   Procedure: RIGHT WRIST STS RELEASE;  Surgeon: Schuyler Amor, MD;  Location: [place];  Service: Orthopedics;  Laterality: Right;  . EXPLORATION OF INCISION FOR CSF LEAK  DEC 2011  X2   POST LAMINECOTMY  . FINGER ARTHRODESIS Right 04/07/2013   Procedure: RIGHT INDEX AND RIGHT LONG DISTAL INTERPHALANGEAL JOINT FUSIONS;  Surgeon: Schuyler Amor, MD;  Location: [place];  Service: Orthopedics;  Laterality: Right;  . FINGER ARTHROPLASTY Right 04/07/2013  Procedure: RIGHT THUMB [place] ARTHROPLASTY;  Surgeon: Schuyler Amor, MD;  Location: [place];  Service: Orthopedics;  Laterality: Right;  . GANGLION CYST EXCISION Right 04/07/2013   Procedure: RIGHT WRIST MASS EXCISION;  Surgeon: Schuyler Amor, MD;  Location: [place];  Service: Orthopedics;  Laterality: Right;  . KNEE ARTHROSCOPY  LEFT X3 (LAST ONE 2005)  . KNEE ARTHROSCOPY  05/17/2011   Procedure: ARTHROSCOPY KNEE;  Surgeon: Bradley Ferris III;  Location: [place];  Service: Orthopedics;  Laterality: Right;  WITH MEDIAL MENISECTOMY AND removal of suprapatella fat lump  . LEFT WRIST  TENOSYNECTOMY W/ LEFT THUMB JOINT REPAIR  02-24-10  . LUMBAR FUSION  11-30-10   L4 - 5  . LUMBAR FUSION  2000   L2 - 4  . LUMBAR LAMINECTOMY  DEC 2011   L4 - 5  . TENDON REPAIR  JAN 2010   LEFT INDEX AND LONG FINGERS   Past Medical History:  Diagnosis Date  . Acute sinusitis, unspecified   . Anxiety state, unspecified   . Arachnoiditis BILATERAL LEGS   DUE TO MULTIPLE BACK SURG.'S  . Arthritis   . Asthma   . Blood transfusion   . Cardiomyopathy HX --06/2010   EF was 25% during acute illness (PHELONEPHRITIS) Repeat echo 12-06-10 60% showed normal EF.   Marland Kitchen Chronic back pain greater than 3 months duration    S/P BACK SURG'S  . Dyslipidemia   . Dysphagia    some post op cerv fusion 2/14  . Essential hypertension, benign   . GERD (gastroesophageal reflux disease) AND HIATIAL HERNIA   CONTROLLED W/ NEXIUM  . Headache(784.0)   . Heart murmur    DENIES S & S   (ECHO JUN'12 W/ CHART)  . History of chronic bronchitis   . Hx of bladder infections   . Hypertension   . Murmur, heart   . Neuromuscular disorder (HCC)    numbness and tingling  . Osteoporosis   . Other malaise and fatigue   . PONV (postoperative nausea and vomiting)   . Shortness of breath   . Varicosities    venous  . Weakness of both legs DUE TO ARACHNOIDITIS   OCCASIONAL USES CANE   BP 135/75 (BP Location: Left Arm, Patient Position: Sitting, Cuff Size: Large)   Pulse (!) 106   SpO2 98%   Opioid Risk Score:   Fall Risk Score:  `1  Depression screen PHQ 2/9  Depression screen [facility] 2/9 01/20/2016 12/20/2015 07/05/2015 03/08/2015 01/06/2015 09/16/2014  Decreased Interest 0 0 1 1 1 1   Down, Depressed, Hopeless 0 0 1 1 1 1   PHQ - 2 Score 0 0 2 2 2 2   Altered sleeping - - - - - 2  Tired, decreased energy - - - - - 2  Change in appetite - - - - - 1  Feeling bad or failure about yourself  - - - - - 1  Trouble concentrating - - - - - 2  Moving slowly or fidgety/restless - - - - - 0  Suicidal thoughts - - - - - 0    PHQ-9 Score - - - - - 10   Review of Systems  All other systems reviewed and are negative.      Objective:   Physical Exam  Constitutional: She is oriented to person, place, and time. She appears well-developed and well-nourished.  HENT:  Head: Normocephalic and atraumatic.  Neck: Normal range of motion.  Neck supple.  Cardiovascular: Normal rate and regular rhythm.   Pulmonary/Chest: Effort normal and breath sounds normal.  Musculoskeletal:  Normal Muscle Bulk and Muscle Testing Reveals: Upper Extremities: Full ROM and Muscle Strength 5/5 Thoracic Paraspinal Tenderness: T-1-T-3 T-7- T-9 Lumbar Paraspinal Tenderness: L-3- L-5 Lower Extremities: Full ROM and Muscle Strength 5/5 Bilateral Lower Extremity Flexion Produces Pain into the Extremities Arises from chair with ease Narrow Based Gait    Neurological: She is alert and oriented to person, place, and time.  Skin: Skin is warm and dry.  Psychiatric: She has a normal mood and affect.  Nursing note and vitals reviewed.         Assessment & Plan:  1. 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  Refilled: Oxycodone 15mg  one tablet every 6 hours as needed #120 and Opana 40 mg one tablet every 12 hours # 60 . Second script given for the following month. We will continue the opioid monitoring program, this consists of regular clinic visits, examinations, urine drug screen, pill counts as well as use of [place] Controlled Substance Reporting System. 2. 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 Dr. Joya Salm Following. Continue Gabapentin 600mg  TID. Encourage Posture and Stretching Exercise.  3. Anxiety/depression: Continue Valium and monitor.  4. Muscle Spasms: Continue to Monitor 5. Neck Pain/ Radiculopathy: Dr. Joya Salm Following  20 minutes of face to face patient care time was spent during this visit. All questions were encouraged and answered.   F/U  in 2 month

## 2016-01-21 DIAGNOSIS — J441 Chronic obstructive pulmonary disease with (acute) exacerbation: Secondary | ICD-10-CM | POA: Diagnosis not present

## 2016-01-21 DIAGNOSIS — E1165 Type 2 diabetes mellitus with hyperglycemia: Secondary | ICD-10-CM | POA: Diagnosis not present

## 2016-01-21 DIAGNOSIS — I10 Essential (primary) hypertension: Secondary | ICD-10-CM | POA: Diagnosis not present

## 2016-01-28 LAB — TOXASSURE SELECT,+ANTIDEPR,UR: PDF: 0

## 2016-01-28 NOTE — Progress Notes (Signed)
Urine drug screen for this encounter is consistent for prescribed medications.   

## 2016-03-15 ENCOUNTER — Other Ambulatory Visit: Payer: Self-pay | Admitting: Internal Medicine

## 2016-03-15 DIAGNOSIS — Z1231 Encounter for screening mammogram for malignant neoplasm of breast: Secondary | ICD-10-CM

## 2016-03-20 DIAGNOSIS — I10 Essential (primary) hypertension: Secondary | ICD-10-CM | POA: Diagnosis not present

## 2016-03-20 DIAGNOSIS — E1165 Type 2 diabetes mellitus with hyperglycemia: Secondary | ICD-10-CM | POA: Diagnosis not present

## 2016-03-20 DIAGNOSIS — L570 Actinic keratosis: Secondary | ICD-10-CM | POA: Diagnosis not present

## 2016-03-21 ENCOUNTER — Ambulatory Visit (HOSPITAL_COMMUNITY)
Admission: RE | Admit: 2016-03-21 | Discharge: 2016-03-21 | Disposition: A | Discharge status: Home / Self Care | Payer: Medicare Other | Source: Ambulatory Visit | Attending: Registered Nurse | Admitting: Registered Nurse

## 2016-03-21 ENCOUNTER — Encounter: Payer: Self-pay | Admitting: Registered Nurse

## 2016-03-21 ENCOUNTER — Encounter: Payer: Medicare Other | Attending: Physical Medicine & Rehabilitation | Admitting: Registered Nurse

## 2016-03-21 VITALS — BP 149/84 | HR 80

## 2016-03-21 DIAGNOSIS — M545 Low back pain, unspecified: Secondary | ICD-10-CM

## 2016-03-21 DIAGNOSIS — Z79899 Other long term (current) drug therapy: Secondary | ICD-10-CM | POA: Insufficient documentation

## 2016-03-21 DIAGNOSIS — M47816 Spondylosis without myelopathy or radiculopathy, lumbar region: Secondary | ICD-10-CM | POA: Diagnosis not present

## 2016-03-21 DIAGNOSIS — G039 Meningitis, unspecified: Secondary | ICD-10-CM | POA: Diagnosis not present

## 2016-03-21 DIAGNOSIS — L821 Other seborrheic keratosis: Secondary | ICD-10-CM | POA: Diagnosis not present

## 2016-03-21 DIAGNOSIS — G8929 Other chronic pain: Secondary | ICD-10-CM | POA: Diagnosis not present

## 2016-03-21 DIAGNOSIS — M546 Pain in thoracic spine: Secondary | ICD-10-CM

## 2016-03-21 DIAGNOSIS — M961 Postlaminectomy syndrome, not elsewhere classified: Secondary | ICD-10-CM | POA: Diagnosis not present

## 2016-03-21 DIAGNOSIS — Z9889 Other specified postprocedural states: Secondary | ICD-10-CM | POA: Diagnosis not present

## 2016-03-21 DIAGNOSIS — L570 Actinic keratosis: Secondary | ICD-10-CM | POA: Diagnosis not present

## 2016-03-21 DIAGNOSIS — M5416 Radiculopathy, lumbar region: Secondary | ICD-10-CM | POA: Diagnosis not present

## 2016-03-21 DIAGNOSIS — M5441 Lumbago with sciatica, right side: Secondary | ICD-10-CM | POA: Insufficient documentation

## 2016-03-21 DIAGNOSIS — M5412 Radiculopathy, cervical region: Secondary | ICD-10-CM

## 2016-03-21 DIAGNOSIS — Z981 Arthrodesis status: Secondary | ICD-10-CM | POA: Diagnosis not present

## 2016-03-21 DIAGNOSIS — M50321 Other cervical disc degeneration at C4-C5 level: Secondary | ICD-10-CM | POA: Diagnosis not present

## 2016-03-21 DIAGNOSIS — M5030 Other cervical disc degeneration, unspecified cervical region: Secondary | ICD-10-CM | POA: Diagnosis not present

## 2016-03-21 DIAGNOSIS — Z5181 Encounter for therapeutic drug level monitoring: Secondary | ICD-10-CM | POA: Diagnosis not present

## 2016-03-21 DIAGNOSIS — G894 Chronic pain syndrome: Secondary | ICD-10-CM

## 2016-03-21 DIAGNOSIS — M5134 Other intervertebral disc degeneration, thoracic region: Secondary | ICD-10-CM | POA: Diagnosis not present

## 2016-03-21 MED ORDER — OXYCODONE HCL 15 MG PO TABS: 15.0000 mg | ORAL_TABLET | Freq: Four times a day (QID) | ORAL | 0 refills | Status: DC | PRN

## 2016-03-21 MED ORDER — OXYMORPHONE HCL ER 40 MG PO TB12
40.0000 mg | ORAL_TABLET | Freq: Two times a day (BID) | ORAL | 0 refills | Status: DC
Start: 2016-03-21 — End: 2016-05-17

## 2016-03-21 MED ORDER — OXYMORPHONE HCL ER 40 MG PO TB12: 40.0000 mg | ORAL_TABLET | Freq: Two times a day (BID) | ORAL | 0 refills | Status: DC

## 2016-03-21 NOTE — Progress Notes (Signed)
Subjective:    Patient ID: [name] [name], female    DOB: 11/01/1950, 65 y.o.   MRN: [id number]  HPI: [name] is a 65 year old female who returns for follow up for chronic pain and medication refill. She states her pain is located in her neck radiating into her left arm, and lower back radiating into her left hip and left lower extremity laterally. She rates her pain 8. Her current exercise regime is walking and performing stretching exercises.   Also states three weeks ago she was picking up the cat litter box when she felt a burning stinging sensation in her mid-back. She followed up with her PCP. Also encouraged to f/u with Dr. Joya Salm. Also stated Dr. Sherrie Sport removed a lump off her left scalp on 03/21/2016.  Pain Inventory Average Pain 7 Pain Right Now 8 My pain is sharp, burning, stabbing and tingling  In the last 24 hours, has pain interfered with the following? General activity 7 Relation with others 8 Enjoyment of life 7 What TIME of day is your pain at its worst? n/a Sleep (in general) NA  Pain is worse with: walking, bending, sitting, inactivity, standing and some activites Pain improves with: heat/ice and medication Relief from Meds: 7  Mobility use a cane how many minutes can you walk? 0 ability to climb steps?  yes do you drive?  yes Do you have any goals in this area?  no  Function Do you have any goals in this area?  no  Neuro/Psych No problems in this area  Prior Studies Any changes since last visit?  no  Physicians involved in your care Any changes since last visit?  no   Family History  Problem Relation Age of Onset  . Hypertension Mother   . Heart disease Mother   . Diabetes Mother   . Cancer Father   . Diabetes Brother    Social History   Social History  . Marital status: Married    Spouse name: N/A  . Number of children: N/A  . Years of education: N/A   Occupational History  . disable Disabled   Social History Main  Topics  . Smoking status: Never Smoker  . Smokeless tobacco: Never Used  . Alcohol use No  . Drug use: No  . Sexual activity: Not on file     Comment: second hand smoke   Other Topics Concern  . Not on file   Social History Narrative   Has one child   Disabled   Past Surgical History:  Procedure Laterality Date  . ABDOMINAL HYSTERECTOMY  1987   W/ BSO  . ANTERIOR CERVICAL DECOMP/DISCECTOMY FUSION N/A 08/09/2012   Procedure: ANTERIOR CERVICAL DECOMPRESSION/DISCECTOMY FUSION 1 LEVEL;  Surgeon: Floyce Stakes, MD;  Location: [facility];  Service: Neurosurgery;  Laterality: N/A;  Cervical four-five  Anterior cervical decompression/diskectomy/fusion  . ANTERIOR FUSION CERVICAL SPINE  2007   C5 -6  . CARPAL TUNNEL RELEASE  RIGHT - 2001  & DEC 2011 W/ BACK SURG.  . CERVICAL DISC SURGERY  2005   C5 - 6  . CHOLECYSTECTOMY  1994  . DORSAL COMPARTMENT RELEASE Right 04/07/2013   Procedure: RIGHT WRIST STS RELEASE;  Surgeon: Schuyler Amor, MD;  Location: [place];  Service: Orthopedics;  Laterality: Right;  . EXPLORATION OF INCISION FOR CSF LEAK  DEC 2011  X2   POST LAMINECOTMY  . FINGER ARTHRODESIS Right 04/07/2013   Procedure: RIGHT INDEX AND  RIGHT LONG DISTAL INTERPHALANGEAL JOINT FUSIONS;  Surgeon: Schuyler Amor, MD;  Location: [place];  Service: Orthopedics;  Laterality: Right;  . FINGER ARTHROPLASTY Right 04/07/2013   Procedure: RIGHT THUMB [place] ARTHROPLASTY;  Surgeon: Schuyler Amor, MD;  Location: [place];  Service: Orthopedics;  Laterality: Right;  . GANGLION CYST EXCISION Right 04/07/2013   Procedure: RIGHT WRIST MASS EXCISION;  Surgeon: Schuyler Amor, MD;  Location: [place];  Service: Orthopedics;  Laterality: Right;  . KNEE ARTHROSCOPY  LEFT X3 (LAST ONE 2005)  . KNEE ARTHROSCOPY  05/17/2011   Procedure: ARTHROSCOPY KNEE;  Surgeon: Bradley Ferris III;  Location: [place];  Service: Orthopedics;  Laterality: Right;  WITH MEDIAL MENISECTOMY AND removal of suprapatella fat lump  . LEFT WRIST TENOSYNECTOMY W/ LEFT THUMB JOINT REPAIR  02-24-10  . LUMBAR FUSION  11-30-10   L4 - 5  . LUMBAR FUSION  2000   L2 - 4  . LUMBAR LAMINECTOMY  DEC 2011   L4 - 5  . TENDON REPAIR  JAN 2010   LEFT INDEX AND LONG FINGERS   Past Medical History:  Diagnosis Date  . Acute sinusitis, unspecified   . Anxiety state, unspecified   . Arachnoiditis BILATERAL LEGS   DUE TO MULTIPLE BACK SURG.'S  . Arthritis   . Asthma   . Blood transfusion   . Cardiomyopathy HX --06/2010   EF was 25% during acute illness (PHELONEPHRITIS) Repeat echo 12-06-10 60% showed normal EF.   Marland Kitchen Chronic back pain greater than 3 months duration    S/P BACK SURG'S  . Dyslipidemia   . Dysphagia    some post op cerv fusion 2/14  . Essential hypertension, benign   . GERD (gastroesophageal reflux disease) AND HIATIAL HERNIA   CONTROLLED W/ NEXIUM  . Headache(784.0)   . Heart murmur    DENIES S & S   (ECHO JUN'12 W/ CHART)  . History of chronic bronchitis   . Hx of bladder infections   . Hypertension   . Murmur, heart   . Neuromuscular disorder (HCC)    numbness and tingling  . Osteoporosis   . Other malaise and fatigue   . PONV (postoperative nausea and vomiting)   . Shortness of breath   . Varicosities    venous  . Weakness of both legs DUE TO ARACHNOIDITIS   OCCASIONAL USES CANE   There were no vitals taken for this visit.  Opioid Risk Score:   Fall Risk Score:  `1  Depression screen PHQ 2/9  Depression screen [facility] 2/9 01/20/2016 12/20/2015 07/05/2015 03/08/2015 01/06/2015 09/16/2014  Decreased Interest 0 0 1 1 1 1   Down, Depressed, Hopeless 0 0 1 1 1 1   PHQ - 2 Score 0 0 2 2 2 2   Altered sleeping - - - - - 2  Tired, decreased energy - - - - - 2  Change in appetite - - - - - 1  Feeling bad or failure about yourself  - - - - - 1  Trouble concentrating - - - - - 2  Moving slowly or  fidgety/restless - - - - - 0  Suicidal thoughts - - - - - 0  PHQ-9 Score - - - - - 10     Review of Systems  All other systems reviewed and are negative.      Objective:   Physical Exam  Constitutional: She is oriented to person, place, and  time. She appears well-developed and well-nourished.  HENT:  Head: Normocephalic and atraumatic.  Neck: Normal range of motion. Neck supple.  Cervical Paraspinal Tenderness: C-5- C-6  Cardiovascular: Normal rate and regular rhythm.   Pulmonary/Chest: Effort normal and breath sounds normal.  Musculoskeletal:  Normal Muscle Bulk and Muscle Testing Reveals: Upper Extremities: Full ROM and Muscle Strength 5/5 Thoracic and Lumbar Hypersensitivity Lower Extremities: Full ROM and Muscle Strength 5/5 Left Greater Trochanteric Tenderness  Lower Extremities: Full ROM and Muscle Strength 5/5 Arises from Table slowly Narrow Based Gait  Neurological: She is alert and oriented to person, place, and time.  Skin: Skin is warm and dry.  Psychiatric: She has a normal mood and affect.  Nursing note and vitals reviewed.         Assessment & Plan:  1. 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  Refilled:Oxycodone 15mg  one tablet every 6 hours as needed #120 and Opana 40 mg one tablet every 12 hours # 60 . Second script given for the following month. We will continue the opioid monitoring program, this consists of regular clinic visits, examinations, urine drug screen, pill counts as well as use of [place] Controlled Substance Reporting System. 2. 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 Dr. Joya Salm Following. Increased Gabapentin 600mg  QID. Encourage Posture and Stretching Exercise.  3. Anxiety/depression: Continue Valium and monitor.  4. Muscle Spasms: Continue to Monitor 5. Neck Pain/ Radiculopathy: Dr. Joya Salm Following  20 minutes of face to face patient care time was spent  during this visit. All questions were encouraged and answered.   F/U in 2 month

## 2016-03-28 ENCOUNTER — Telehealth: Payer: Self-pay | Admitting: Registered Nurse

## 2016-03-28 NOTE — Telephone Encounter (Signed)
Placed a call to Ms. Earll regarding her X-ray results, no answer. Left message to return the call.

## 2016-03-29 ENCOUNTER — Telehealth: Payer: Self-pay | Admitting: Physical Medicine & Rehabilitation

## 2016-03-29 NOTE — Telephone Encounter (Signed)
Returned [name] [name] call, reviewed X-rays: [name] [name] would like to speak with Dr. Joya Salm regarding her X-ray results. She will call with Dr. Joya Salm decsions she states.

## 2016-03-29 NOTE — Telephone Encounter (Signed)
Patient is returning [name] [name] phone call. °

## 2016-04-05 DIAGNOSIS — I10 Essential (primary) hypertension: Secondary | ICD-10-CM | POA: Diagnosis not present

## 2016-04-05 DIAGNOSIS — E1165 Type 2 diabetes mellitus with hyperglycemia: Secondary | ICD-10-CM | POA: Diagnosis not present

## 2016-04-05 DIAGNOSIS — J441 Chronic obstructive pulmonary disease with (acute) exacerbation: Secondary | ICD-10-CM | POA: Diagnosis not present

## 2016-04-18 ENCOUNTER — Ambulatory Visit
Admission: RE | Admit: 2016-04-18 | Discharge: 2016-04-18 | Disposition: A | Discharge status: Home / Self Care | Payer: Medicare Other | Source: Ambulatory Visit | Attending: Internal Medicine | Admitting: Internal Medicine

## 2016-04-18 ENCOUNTER — Encounter: Payer: Self-pay | Admitting: Internal Medicine

## 2016-04-18 ENCOUNTER — Ambulatory Visit (INDEPENDENT_AMBULATORY_CARE_PROVIDER_SITE_OTHER): Payer: Medicare Other | Admitting: Internal Medicine

## 2016-04-18 VITALS — BP 120/68 | HR 73 | Ht 65.0 in | Wt 180.8 lb

## 2016-04-18 DIAGNOSIS — Z9189 Other specified personal risk factors, not elsewhere classified: Secondary | ICD-10-CM | POA: Insufficient documentation

## 2016-04-18 DIAGNOSIS — Z1231 Encounter for screening mammogram for malignant neoplasm of breast: Secondary | ICD-10-CM | POA: Diagnosis not present

## 2016-04-18 DIAGNOSIS — R911 Solitary pulmonary nodule: Secondary | ICD-10-CM | POA: Diagnosis not present

## 2016-04-18 DIAGNOSIS — J4541 Moderate persistent asthma with (acute) exacerbation: Secondary | ICD-10-CM | POA: Diagnosis not present

## 2016-04-18 MED ORDER — DOXYCYCLINE HYCLATE 100 MG PO TABS: ORAL_TABLET | ORAL | 0 refills | Status: DC

## 2016-04-18 MED ORDER — PREDNISONE 10 MG PO TABS: ORAL_TABLET | ORAL | 0 refills | Status: DC

## 2016-04-18 NOTE — Patient Instructions (Addendum)
ICD-9-CM ICD-10-CM   1. Moderate persistent asthma with acute exacerbation 493.92 J45.41   2. Lung nodule 793.11 R91.1   3. At risk from passive smoking V15.89 Z91.89     Do ct chest wo contrast Take doxycycline 100mg  po twice daily x 5 days; take after meals and avoid sunlight Please take prednisone 40 mg x1 day, then 30 mg x1 day, then 20 mg x1 day, then 10 mg x1 day, and then 5 mg x1 day and stop   Followup  - wil call with CT results  - otherwise ROV in 6 months  ` -feno/acq at followup

## 2016-04-18 NOTE — Progress Notes (Signed)
Subjective:     Patient ID: [name] [name], female   DOB: Sep 17, 1950, 65 y.o.   MRN: [id number]  HPI   OV 04/18/2016  Chief Complaint  Patient presents with  . Follow-up    Pt states voice is raspy. Pt c/o of some congestion more in morning, no tightness, and SOB w/ and w/o excertions. Pt states cough comes and goes.     FU asthma - MCT positive - on symbicort FU 33mm lung nodules - passive smoking Chronic voice hoarseness nos  Seeing afer 1 year. In interim has seen others. [place] at beach s[place]p syncope and near cardiac arrest due to dehydratiin per her hx. Since then not the same. Last week having right infrascapular pain that gets worse when she lifts her hand. Also last week or so increased hoarseness of voice is increased cough and wheezing production.She is demanding to have arepeat CT scan of chest for her 51mm nodules.She is extremely worried about cancer.   has a past medical history of Acute sinusitis, unspecified; Anxiety state, unspecified; Arachnoiditis (BILATERAL LEGS); Arthritis; Asthma; Blood transfusion; Cardiomyopathy (HX --06/2010); Chronic back pain greater than 3 months duration; Dyslipidemia; Dysphagia; Essential hypertension, benign; GERD (gastroesophageal reflux disease) (AND HIATIAL HERNIA); Headache(784.0); Heart murmur; History of chronic bronchitis; bladder infections; Hypertension; Murmur, heart; Neuromuscular disorder ([place]); Osteoporosis; Other malaise and fatigue; PONV (postoperative nausea and vomiting); Shortness of breath; Varicosities; and Weakness of both legs (DUE TO ARACHNOIDITIS).   reports that she has never smoked. She has never used smokeless tobacco.  Past Surgical History:  Procedure Laterality Date  . ABDOMINAL HYSTERECTOMY  1987   W/ BSO  . ANTERIOR CERVICAL DECOMP/DISCECTOMY FUSION N/A 08/09/2012   Procedure: ANTERIOR CERVICAL DECOMPRESSION/DISCECTOMY FUSION 1 LEVEL;  Surgeon: Floyce Stakes, MD;  Location: [facility];  Service:  Neurosurgery;  Laterality: N/A;  Cervical four-five  Anterior cervical decompression/diskectomy/fusion  . ANTERIOR FUSION CERVICAL SPINE  2007   C5 -6  . CARPAL TUNNEL RELEASE  RIGHT - 2001  & DEC 2011 W/ BACK SURG.  . CERVICAL DISC SURGERY  2005   C5 - 6  . CHOLECYSTECTOMY  1994  . DORSAL COMPARTMENT RELEASE Right 04/07/2013   Procedure: RIGHT WRIST STS RELEASE;  Surgeon: Schuyler Amor, MD;  Location: [place];  Service: Orthopedics;  Laterality: Right;  . EXPLORATION OF INCISION FOR CSF LEAK  DEC 2011  X2   POST LAMINECOTMY  . FINGER ARTHRODESIS Right 04/07/2013   Procedure: RIGHT INDEX AND RIGHT LONG DISTAL INTERPHALANGEAL JOINT FUSIONS;  Surgeon: Schuyler Amor, MD;  Location: [place];  Service: Orthopedics;  Laterality: Right;  . FINGER ARTHROPLASTY Right 04/07/2013   Procedure: RIGHT THUMB [place] ARTHROPLASTY;  Surgeon: Schuyler Amor, MD;  Location: [place];  Service: Orthopedics;  Laterality: Right;  . GANGLION CYST EXCISION Right 04/07/2013   Procedure: RIGHT WRIST MASS EXCISION;  Surgeon: Schuyler Amor, MD;  Location: [place];  Service: Orthopedics;  Laterality: Right;  . KNEE ARTHROSCOPY  LEFT X3 (LAST ONE 2005)  . KNEE ARTHROSCOPY  05/17/2011   Procedure: ARTHROSCOPY KNEE;  Surgeon: Bradley Ferris III;  Location: [place];  Service: Orthopedics;  Laterality: Right;  WITH MEDIAL MENISECTOMY AND removal of suprapatella fat lump  . LEFT WRIST TENOSYNECTOMY W/ LEFT THUMB JOINT REPAIR  02-24-10  . LUMBAR FUSION  11-30-10   L4 - 5  . LUMBAR FUSION  2000   L2 - 4  .  LUMBAR LAMINECTOMY  DEC 2011   L4 - 5  . TENDON REPAIR  JAN 2010   LEFT INDEX AND LONG FINGERS    Allergies  Allergen Reactions  . Latex Itching and Rash  . Morphine And Related Hives and Itching  . Aspirin Nausea And Vomiting    Can take coated asa  . Biaxin [Clarithromycin] Nausea And Vomiting  .  Clarithromycin Diarrhea  . Codeine Itching  . Darvocet [Propoxyphene N-Acetaminophen] Itching  . Lortab [Hydrocodone-Acetaminophen] Itching  . Percocet [Oxycodone-Acetaminophen] Itching    Immunization History  Administered Date(s) Administered  . Influenza Split 04/19/2013, 03/18/2014  . Influenza,inj,Quad PF,36+ Mos 05/04/2015  . Pneumococcal Polysaccharide-23 04/19/2013  . Zoster 05/30/2013    Family History  Problem Relation Age of Onset  . Hypertension Mother   . Heart disease Mother   . Diabetes Mother   . Cancer Father   . Diabetes Brother      Current Outpatient Prescriptions:  .  alendronate (FOSAMAX) 70 MG tablet, Take 70 mg by mouth once a week. Take with a full glass of water on an empty stomach., Disp: , Rfl:  .  amitriptyline (ELAVIL) 100 MG tablet, Take 1 tablet (100 mg total) by mouth at bedtime., Disp: 30 tablet, Rfl: 4 .  aspirin EC 81 MG tablet, Take 81 mg by mouth See admin instructions. Every other day., Disp: , Rfl:  .  azelastine (ASTELIN) 0.1 % nasal spray, USE 2 SPRAYS IN BOTH NOSTRILS DAILY., Disp: 30 mL, Rfl: 5 .  Biotin 10 MG CAPS, Take 10 mg by mouth daily., Disp: , Rfl:  .  budesonide-formoterol (SYMBICORT) 80-4.5 MCG/ACT inhaler, Inhale 2 puffs into the lungs 2 (two) times daily., Disp: 1 Inhaler, Rfl: 6 .  Calcium Carbonate-Vit D-Min 1200-1000 MG-UNIT CHEW, Chew 1 tablet by mouth 2 (two) times daily. , Disp: , Rfl:  .  diazepam (VALIUM) 10 MG tablet, Take 10 mg by mouth 3 (three) times daily as needed (muscle spasms). , Disp: , Rfl:  .  diltiazem (CARDIZEM CD) 120 MG 24 hr capsule, , Disp: , Rfl:  .  esomeprazole (NEXIUM) 40 MG capsule, Take 40 mg by mouth every morning. , Disp: , Rfl:  .  estradiol (ESTRACE) 2 MG tablet, , Disp: , Rfl:  .  fluticasone (FLONASE) 50 MCG/ACT nasal spray, Place 2 sprays into both nostrils daily., Disp: 16 g, Rfl: 2 .  gabapentin (NEURONTIN) 600 MG tablet, TAKE 1 TABLET BY MOUTH THREE TIMES DAILY, Disp: 270 tablet,  Rfl: 2 .  JINTELI 1-5 MG-MCG TABS tablet, , Disp: , Rfl:  .  levalbuterol (XOPENEX HFA) 45 MCG/ACT inhaler, Inhale 1-2 puffs into the lungs every 4 (four) hours as needed. For shortness of breath, Disp: 1 Inhaler, Rfl: 6 .  lidocaine (XYLOCAINE) 5 % ointment, Apply as needed for back pain, Disp: , Rfl:  .  losartan (COZAAR) 50 MG tablet, Take 1 tablet by mouth 2 (two) times daily., Disp: , Rfl:  .  metFORMIN (GLUCOPHAGE) 500 MG tablet, Take 500 mg by mouth 2 (two) times daily with a meal. , Disp: , Rfl:  .  oxyCODONE (ROXICODONE) 15 MG immediate release tablet, Take 1 tablet (15 mg total) by mouth every 6 (six) hours as needed. For pain, Disp: 120 tablet, Rfl: 0 .  oxymorphone (OPANA ER) 40 MG 12 hr tablet, Take 1 tablet (40 mg total) by mouth every 12 (twelve) hours., Disp: 60 tablet, Rfl: 0 .  simvastatin (ZOCOR) 10 MG tablet, Take 1 tablet  by mouth at bedtime., Disp: , Rfl:  .  valACYclovir (VALTREX) 500 MG tablet, Take 1,000 mg by mouth daily. , Disp: , Rfl:  .  venlafaxine XR (EFFEXOR-XR) 75 MG 24 hr capsule, Take 1 capsule by mouth daily., Disp: , Rfl:  .  vitamin B-12 (CYANOCOBALAMIN) 1000 MCG tablet, Take 1,000 mcg by mouth daily. , Disp: , Rfl:  No current facility-administered medications for this visit.   Facility-Administered Medications Ordered in Other Visits:  .  levalbuterol (XOPENEX) nebulizer solution 0.63 mg, 0.63 mg, Nebulization, Once, Tammy S Parrett, NP    Review of Systems     Objective:   Physical Exam  Constitutional: She is oriented to person, place, and time. She appears well-developed and well-nourished. No distress.  HENT:  Head: Normocephalic and atraumatic.  Right Ear: External ear normal.  Left Ear: External ear normal.  Mouth/Throat: Oropharynx is clear and moist. No oropharyngeal exudate.  Real hoarse voice  Eyes: Conjunctivae and EOM are normal. Pupils are equal, round, and reactive to light. Right eye exhibits no discharge. Left eye exhibits no  discharge. No scleral icterus.  Neck: Normal range of motion. Neck supple. No JVD present. No tracheal deviation present. No thyromegaly present.  Cardiovascular: Normal rate, regular rhythm, normal heart sounds and intact distal pulses.  Exam reveals no gallop and no friction rub.   No murmur heard. Pulmonary/Chest: Effort normal and breath sounds normal. No respiratory distress. She has no wheezes. She has no rales. She exhibits no tenderness.  Rt side crackles  Abdominal: Soft. Bowel sounds are normal. She exhibits no distension and no mass. There is no tenderness. There is no rebound and no guarding.  Musculoskeletal: Normal range of motion. She exhibits no edema or tenderness.  Lymphadenopathy:    She has no cervical adenopathy.  Neurological: She is alert and oriented to person, place, and time. She has normal reflexes. No cranial nerve deficit. She exhibits normal muscle tone. Coordination normal.  Skin: Skin is warm and dry. No rash noted. She is not diaphoretic. No erythema. No pallor.  Psychiatric: She has a normal mood and affect. Her behavior is normal. Judgment and thought content normal.  Vitals reviewed.   Vitals:   04/18/16 1128  BP: 120/68  Pulse: 73  SpO2: 97%  Weight: 180 lb 12.8 oz (82 kg)  Height: 5\' 5"  (1.651 m)   Estimated body mass index is 30.09 kg/m as calculated from the following:   Height as of this encounter: 5\' 5"  (1.651 m).   Weight as of this encounter: 180 lb 12.8 oz (82 kg).      Assessment:       ICD-9-CM ICD-10-CM   1. Moderate persistent asthma with acute exacerbation 493.92 J45.41   2. Lung nodule 793.11 R91.1   3. At risk from passive smoking V15.89 Z91.89        Plan:      Do ct chest wo contrast Take doxycycline 100mg  po twice daily x 5 days; take after meals and avoid sunlight Please take prednisone 40 mg x1 day, then 30 mg x1 day, then 20 mg x1 day, then 10 mg x1 day, and then 5 mg x1 day and stop   Followup  - wil call with  CT results  - otherwise ROV in 6 months  ` -feno/acq at followup    Dr. Brand Males, M.D., [facility].C.P Pulmonary and Critical Care Medicine Staff Physician [place] [facility] Pager: [phone], If no  answer or between  15:00h - 7:00h: call [phone]  04/18/2016 11:56 AM

## 2016-04-21 DIAGNOSIS — E1165 Type 2 diabetes mellitus with hyperglycemia: Secondary | ICD-10-CM | POA: Diagnosis not present

## 2016-04-21 DIAGNOSIS — J441 Chronic obstructive pulmonary disease with (acute) exacerbation: Secondary | ICD-10-CM | POA: Diagnosis not present

## 2016-04-21 DIAGNOSIS — I10 Essential (primary) hypertension: Secondary | ICD-10-CM | POA: Diagnosis not present

## 2016-04-25 ENCOUNTER — Inpatient Hospital Stay: Admission: RE | Admit: 2016-04-25 | Payer: Medicare Other | Source: Ambulatory Visit

## 2016-05-01 ENCOUNTER — Other Ambulatory Visit: Payer: Self-pay | Admitting: Registered Nurse

## 2016-05-04 ENCOUNTER — Ambulatory Visit (INDEPENDENT_AMBULATORY_CARE_PROVIDER_SITE_OTHER)
Admission: RE | Admit: 2016-05-04 | Discharge: 2016-05-04 | Disposition: A | Discharge status: Home / Self Care | Payer: Medicare Other | Source: Ambulatory Visit | Attending: Internal Medicine | Admitting: Internal Medicine

## 2016-05-04 DIAGNOSIS — R911 Solitary pulmonary nodule: Secondary | ICD-10-CM | POA: Diagnosis not present

## 2016-05-04 DIAGNOSIS — R918 Other nonspecific abnormal finding of lung field: Secondary | ICD-10-CM | POA: Diagnosis not present

## 2016-05-09 ENCOUNTER — Encounter: Payer: Self-pay | Admitting: Physical Medicine & Rehabilitation

## 2016-05-10 ENCOUNTER — Other Ambulatory Visit: Payer: Self-pay | Admitting: *Deleted

## 2016-05-10 MED ORDER — AMITRIPTYLINE HCL 100 MG PO TABS: 100.0000 mg | ORAL_TABLET | Freq: Every day | ORAL | 4 refills | Status: DC

## 2016-05-17 ENCOUNTER — Encounter: Payer: Medicare Other | Attending: Physical Medicine & Rehabilitation | Admitting: Physical Medicine & Rehabilitation

## 2016-05-17 ENCOUNTER — Encounter: Payer: Self-pay | Admitting: Physical Medicine & Rehabilitation

## 2016-05-17 VITALS — BP 127/77 | HR 63 | Resp 14

## 2016-05-17 DIAGNOSIS — M961 Postlaminectomy syndrome, not elsewhere classified: Secondary | ICD-10-CM

## 2016-05-17 DIAGNOSIS — Z79899 Other long term (current) drug therapy: Secondary | ICD-10-CM | POA: Insufficient documentation

## 2016-05-17 DIAGNOSIS — Z5181 Encounter for therapeutic drug level monitoring: Secondary | ICD-10-CM | POA: Insufficient documentation

## 2016-05-17 DIAGNOSIS — G039 Meningitis, unspecified: Secondary | ICD-10-CM | POA: Diagnosis not present

## 2016-05-17 DIAGNOSIS — M5441 Lumbago with sciatica, right side: Secondary | ICD-10-CM | POA: Insufficient documentation

## 2016-05-17 MED ORDER — OXYCODONE HCL 15 MG PO TABS: 15.0000 mg | ORAL_TABLET | Freq: Four times a day (QID) | ORAL | 0 refills | Status: DC | PRN

## 2016-05-17 MED ORDER — OXYCODONE HCL ER 40 MG PO T12A: 40.0000 mg | EXTENDED_RELEASE_TABLET | Freq: Two times a day (BID) | ORAL | 0 refills | Status: DC

## 2016-05-17 NOTE — Patient Instructions (Signed)
REMEMBER TO STRETCH, USE HEAT, MUSCLE RELAXANTS FOR ACUTE INCREASES IN PAIN.

## 2016-05-17 NOTE — Progress Notes (Signed)
Subjective:    Patient ID: [name] [name], female    DOB: [birth year], 65 y.o.   MRN: [id number]  HPI   [name] [name] is here in follow up of her chronic pain. [name] [name] is nervous about potentially switching off Opana. [name] [name] also has found that over the last year the oxycodone 15mg  has not been as effective. [name] [name] has tried to maintain her activity as possible. [name] [name] is limited with ROM in both her back and neck. [name] [name] feels shock like sensations in these areas if [name] [name] pushes too far. In her neck, it sometims is difficult finding a way to cool down the sudden increase in her pain.   [name] [name] is currently on opana er 40mg  q12 with oxycodone 15mg  q6 prn. [name] [name] has been compliant with our program,counts, etc  Pain Inventory Average Pain 7 Pain Right Now 9 My pain is sharp, burning, stabbing, tingling and aching  In the last 24 hours, has pain interfered with the following? General activity 8 Relation with others 8 Enjoyment of life 8 What TIME of day is your pain at its worst? all Sleep (in general) Fair  Pain is worse with: walking, sitting, standing and some activites Pain improves with: heat/ice and medication Relief from Meds: 7  Mobility walk with assistance use a cane  Function Do you have any goals in this area?  no  Neuro/Psych No problems in this area  Prior Studies Any changes since last visit?  no  Physicians involved in your care Any changes since last visit?  no   Family History  Problem Relation Age of Onset  . Hypertension Mother   . Heart disease Mother   . Diabetes Mother   . Cancer Father   . Diabetes Brother    Social History   Social History  . Marital status: Married    Spouse name: N/A  . Number of children: N/A  . Years of education: N/A   Occupational History  . disable Disabled   Social History Main Topics  . Smoking status: Never Smoker  . Smokeless tobacco: Never Used  . Alcohol use No  . Drug use: No  . Sexual activity: Not Asked     Comment:  second hand smoke   Other Topics Concern  . None   Social History Narrative   Has one child   Disabled   Past Surgical History:  Procedure Laterality Date  . ABDOMINAL HYSTERECTOMY  1987   W/ BSO  . ANTERIOR CERVICAL DECOMP/DISCECTOMY FUSION N/A 08/09/2012   Procedure: ANTERIOR CERVICAL DECOMPRESSION/DISCECTOMY FUSION 1 LEVEL;  Surgeon: Floyce Stakes, MD;  Location: [facility];  Service: Neurosurgery;  Laterality: N/A;  Cervical four-five  Anterior cervical decompression/diskectomy/fusion  . ANTERIOR FUSION CERVICAL SPINE  2007   C5 -6  . CARPAL TUNNEL RELEASE  RIGHT - 2001  & DEC 2011 W/ BACK SURG.  . CERVICAL DISC SURGERY  2005   C5 - 6  . CHOLECYSTECTOMY  1994  . DORSAL COMPARTMENT RELEASE Right 04/07/2013   Procedure: RIGHT WRIST STS RELEASE;  Surgeon: Schuyler Amor, MD;  Location: [place];  Service: Orthopedics;  Laterality: Right;  . EXPLORATION OF INCISION FOR CSF LEAK  DEC 2011  X2   POST LAMINECOTMY  . FINGER ARTHRODESIS Right 04/07/2013   Procedure: RIGHT INDEX AND RIGHT LONG DISTAL INTERPHALANGEAL JOINT FUSIONS;  Surgeon: Schuyler Amor, MD;  Location: [place];  Service: Orthopedics;  Laterality: Right;  . FINGER ARTHROPLASTY Right 04/07/2013  Procedure: RIGHT THUMB [place] ARTHROPLASTY;  Surgeon: Schuyler Amor, MD;  Location: [place];  Service: Orthopedics;  Laterality: Right;  . GANGLION CYST EXCISION Right 04/07/2013   Procedure: RIGHT WRIST MASS EXCISION;  Surgeon: Schuyler Amor, MD;  Location: [place];  Service: Orthopedics;  Laterality: Right;  . KNEE ARTHROSCOPY  LEFT X3 (LAST ONE 2005)  . KNEE ARTHROSCOPY  05/17/2011   Procedure: ARTHROSCOPY KNEE;  Surgeon: Bradley Ferris III;  Location: [place];  Service: Orthopedics;  Laterality: Right;  WITH MEDIAL MENISECTOMY AND removal of suprapatella fat lump  . LEFT WRIST TENOSYNECTOMY W/ LEFT THUMB JOINT REPAIR   02-24-10  . LUMBAR FUSION  11-30-10   L4 - 5  . LUMBAR FUSION  2000   L2 - 4  . LUMBAR LAMINECTOMY  DEC 2011   L4 - 5  . TENDON REPAIR  JAN 2010   LEFT INDEX AND LONG FINGERS   Past Medical History:  Diagnosis Date  . Acute sinusitis, unspecified   . Anxiety state, unspecified   . Arachnoiditis BILATERAL LEGS   DUE TO MULTIPLE BACK SURG.'S  . Arthritis   . Asthma   . Blood transfusion   . Cardiomyopathy HX --06/2010   EF was 25% during acute illness (PHELONEPHRITIS) Repeat echo 12-06-10 60% showed normal EF.   Marland Kitchen Chronic back pain greater than 3 months duration    S/P BACK SURG'S  . Dyslipidemia   . Dysphagia    some post op cerv fusion 2/14  . Essential hypertension, benign   . GERD (gastroesophageal reflux disease) AND HIATIAL HERNIA   CONTROLLED W/ NEXIUM  . Headache(784.0)   . Heart murmur    DENIES S & S   (ECHO JUN'12 W/ CHART)  . History of chronic bronchitis   . Hx of bladder infections   . Hypertension   . Murmur, heart   . Neuromuscular disorder ([facility])    numbness and tingling  . Osteoporosis   . Other malaise and fatigue   . PONV (postoperative nausea and vomiting)   . Shortness of breath   . Varicosities    venous  . Weakness of both legs DUE TO ARACHNOIDITIS   OCCASIONAL USES CANE   BP 127/77 (BP Location: Right Arm, Patient Position: Sitting, Cuff Size: Normal)   Pulse 63   Resp 14   SpO2 98%   Opioid Risk Score:   Fall Risk Score:  `1  Depression screen PHQ 2/9  Depression screen [facility] 2/9 01/20/2016 12/20/2015 07/05/2015 03/08/2015 01/06/2015 09/16/2014  Decreased Interest 0 0 1 1 1 1   Down, Depressed, Hopeless 0 0 1 1 1 1   PHQ - 2 Score 0 0 2 2 2 2   Altered sleeping - - - - - 2  Tired, decreased energy - - - - - 2  Change in appetite - - - - - 1  Feeling bad or failure about yourself  - - - - - 1  Trouble concentrating - - - - - 2  Moving slowly or fidgety/restless - - - - - 0  Suicidal thoughts - - - - - 0  PHQ-9 Score - - - - - 10     Review of Systems  Constitutional: Negative.   HENT: Negative.   Eyes: Negative.   Cardiovascular: Negative.   Gastrointestinal: Negative.   Endocrine: Negative.   Genitourinary: Negative.   Musculoskeletal: Positive for arthralgias, back pain, myalgias and neck pain.  Allergic/Immunologic: Negative.  Hematological: Negative.   Psychiatric/Behavioral: Negative.   All other systems reviewed and are negative.      Objective:   Physical Exam  Constitutional: [name] [name] is oriented to person, place, and time. [name] [name] appears well-developed and well-nourished.  HENT: hoarse voice  Head: Normocephalic and atraumatic.  Eyes: Conjunctivae and EOM are normal. Pupils are equal, round, and reactive to light.  Neck: Normal range of motion. Neck supple.  Cardiovascular: RRR.  Pulmonary/Chest: clear.  Abdominal: Soft. Bowel sounds are normal. [name] [name] exhibits no distension.  Musculoskeletal: tenderness with palpation of both greater trochs Cervical back: atrophy of paraspinals. Limited ROM in all planes  Lumbar back: lumbar flexion to about 45 degrees with back and leg pain.  Neurological: [name] [name] is alert and oriented to person, place, and time. No cranial nerve deficit.  strength grossly 4+/5 in UE's. SStill there are no consistent or focal sensory findings are seen in the arms. Lower extremities with sensory loss at 1 out of 2, particularly below the knees. . Reflexes are generally trace to 1+ throughout both legs. UE DTR's are 2+. Uses a cane for balance during gait. Balance fair to good. Psychiatric: [name] [name] has a normal mood and affect. [name] [name] is pleasanat Assessment & Plan:   ASSESSMENT:  1. 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 [name] [name] has documented C6 radiculitis in the past. I would not be surprised if [name] [name] has scarring in the cervical region also 2. Post lumbar post laminectomy syndrome.  3. 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 Again, I don't see progression of symptoms/disease on exam.  4. Anxiety/depression secondary to the above    PLAN: ; 1. Continue gabapentin 600mg  TID for nerve pain.   2. a good candidate for a spinal stimulator but [name] [name] remains hesitant due to her past experience and experience of others [name] [name] knows.   3. Will convert from Opana ER to Oxycontin 40mg  q12---[name] [name] understands that this is not equidose--#60. Consider continuing this or looking at other options such as Exalgo or Nucynta ER in 2018. We spent extensive time reviewing her current formulary today. 4. Will continue oxy IR 15mg  for breakthrough. q6 hour prn #120. Really want to avoid increasing her short acting narc  5. [name] [name] is now considering a caudal block to help with symptoms.  .  6. Discussed the importance of maintaining activity as possible  7. Flexeril and valium prn for spasms  8. PENS trial on hold. 9. The patient will see me or NP in one month. All questions were encouraged and answered. I stressed the importance of lumbar and LE ROM and persistent activity to prevent further tethering and tightening of her low back and cauda equina.

## 2016-05-25 ENCOUNTER — Telehealth: Payer: Self-pay | Admitting: Internal Medicine

## 2016-05-25 NOTE — Telephone Encounter (Signed)
Called and spoke to [employer]. Amber is requesting results of CT chest. Advised Amber that we will need a release form signed by pt to have results faxed to her. Amber verbalized understanding and denied any further questions or concerns at this time.

## 2016-05-29 ENCOUNTER — Telehealth: Payer: Self-pay | Admitting: Internal Medicine

## 2016-05-29 NOTE — Telephone Encounter (Signed)
Faxed last two chest Ct scans to [employer]. Pt notified.

## 2016-06-08 DIAGNOSIS — M79642 Pain in left hand: Secondary | ICD-10-CM | POA: Diagnosis not present

## 2016-06-08 DIAGNOSIS — M180 Bilateral primary osteoarthritis of first carpometacarpal joints: Secondary | ICD-10-CM | POA: Diagnosis not present

## 2016-06-14 ENCOUNTER — Ambulatory Visit: Payer: Medicare Other | Admitting: Registered Nurse

## 2016-06-14 ENCOUNTER — Ambulatory Visit: Payer: Medicare Other

## 2016-06-14 DIAGNOSIS — Z0000 Encounter for general adult medical examination without abnormal findings: Secondary | ICD-10-CM | POA: Diagnosis not present

## 2016-06-14 DIAGNOSIS — Z1389 Encounter for screening for other disorder: Secondary | ICD-10-CM | POA: Diagnosis not present

## 2016-06-14 DIAGNOSIS — E784 Other hyperlipidemia: Secondary | ICD-10-CM | POA: Diagnosis not present

## 2016-06-14 DIAGNOSIS — I10 Essential (primary) hypertension: Secondary | ICD-10-CM | POA: Diagnosis not present

## 2016-06-14 DIAGNOSIS — E1165 Type 2 diabetes mellitus with hyperglycemia: Secondary | ICD-10-CM | POA: Diagnosis not present

## 2016-06-15 DIAGNOSIS — I10 Essential (primary) hypertension: Secondary | ICD-10-CM | POA: Diagnosis not present

## 2016-06-15 DIAGNOSIS — E1165 Type 2 diabetes mellitus with hyperglycemia: Secondary | ICD-10-CM | POA: Diagnosis not present

## 2016-06-15 DIAGNOSIS — J441 Chronic obstructive pulmonary disease with (acute) exacerbation: Secondary | ICD-10-CM | POA: Diagnosis not present

## 2016-06-20 ENCOUNTER — Encounter: Payer: Medicare Other | Attending: Physical Medicine & Rehabilitation | Admitting: Registered Nurse

## 2016-06-20 ENCOUNTER — Encounter: Payer: Self-pay | Admitting: Registered Nurse

## 2016-06-20 VITALS — BP 131/83 | HR 91 | Resp 14

## 2016-06-20 DIAGNOSIS — M961 Postlaminectomy syndrome, not elsewhere classified: Secondary | ICD-10-CM | POA: Insufficient documentation

## 2016-06-20 DIAGNOSIS — Z79899 Other long term (current) drug therapy: Secondary | ICD-10-CM | POA: Diagnosis not present

## 2016-06-20 DIAGNOSIS — G894 Chronic pain syndrome: Secondary | ICD-10-CM

## 2016-06-20 DIAGNOSIS — Z5181 Encounter for therapeutic drug level monitoring: Secondary | ICD-10-CM | POA: Insufficient documentation

## 2016-06-20 DIAGNOSIS — M546 Pain in thoracic spine: Secondary | ICD-10-CM | POA: Diagnosis not present

## 2016-06-20 DIAGNOSIS — Z23 Encounter for immunization: Secondary | ICD-10-CM | POA: Diagnosis not present

## 2016-06-20 DIAGNOSIS — M5441 Lumbago with sciatica, right side: Secondary | ICD-10-CM | POA: Insufficient documentation

## 2016-06-20 DIAGNOSIS — G039 Meningitis, unspecified: Secondary | ICD-10-CM | POA: Insufficient documentation

## 2016-06-20 DIAGNOSIS — M5416 Radiculopathy, lumbar region: Secondary | ICD-10-CM

## 2016-06-20 MED ORDER — OXYCODONE HCL ER 40 MG PO T12A: 40.0000 mg | EXTENDED_RELEASE_TABLET | Freq: Two times a day (BID) | ORAL | 0 refills | Status: DC

## 2016-06-20 MED ORDER — OXYCODONE HCL 15 MG PO TABS: 15.0000 mg | ORAL_TABLET | Freq: Four times a day (QID) | ORAL | 0 refills | Status: DC | PRN

## 2016-06-20 MED ORDER — HYDROMORPHONE HCL ER 12 MG PO T24A: 12.0000 mg | EXTENDED_RELEASE_TABLET | Freq: Two times a day (BID) | ORAL | 0 refills | Status: DC

## 2016-06-20 NOTE — Progress Notes (Signed)
Subjective:    Patient ID: [name] [name], female    DOB: 01/28/1951, 66 y.o.   MRN: [id number]  HPI: [name] [name] is a 66year old female who returns for follow up appointment for chronic pain and medication refill. She states her pain is located in her neck radiating into her bilateral shoulders and left arm, and lower back radiating into her bilateral buttocks.  She rates her pain 9. Her current exercise regime is walking and performing stretching exercises.   Also states she was symptomatic with the Oxycontin she devleoped nausea and itching. She has tried the Oxycontin alone without other medications and she still developed the nausea and itching. Reviewed Dr. Naaman Plummer note we will order Exalgo, reviewed the Opioid calculator and discuss with Dr. Naaman Plummer will order Exalog 12 mg every 12 hours . We reviewed her Formulary, [name] [name] called her Geophysical data processor, she stated this medication may not be approved. She asked to be prescribed the Oxycontin until she's able to call back to her [employer]. Instructed to call with any questions or concerns.  Total time spent on this matter 60 minutes, all questions answered and she verbalizes understanding.   Pain Inventory Average Pain 7 Pain Right Now 9 My pain is sharp, burning, stabbing and tingling  In the last 24 hours, has pain interfered with the following? General activity 8 Relation with others 8 Enjoyment of life 8 What TIME of day is your pain at its worst? all Sleep (in general) Fair  Pain is worse with: walking, bending, sitting, standing and some activites Pain improves with: rest, heat/ice and medication Relief from Meds: 7  Mobility Do you have any goals in this area?  no  Function Do you have any goals in this area?  no  Neuro/Psych No problems in this area  Prior Studies Any changes since last visit?  no  Physicians involved in your care Any changes since last visit?  no   Family History   Problem Relation Age of Onset  . Hypertension Mother   . Heart disease Mother   . Diabetes Mother   . Cancer Father   . Diabetes Brother    Social History   Social History  . Marital status: Married    Spouse name: N/A  . Number of children: N/A  . Years of education: N/A   Occupational History  . disable Disabled   Social History Main Topics  . Smoking status: Never Smoker  . Smokeless tobacco: Never Used  . Alcohol use No  . Drug use: No  . Sexual activity: Not Asked     Comment: second hand smoke   Other Topics Concern  . None   Social History Narrative   Has one child   Disabled   Past Surgical History:  Procedure Laterality Date  . ABDOMINAL HYSTERECTOMY  1987   W/ BSO  . ANTERIOR CERVICAL DECOMP/DISCECTOMY FUSION N/A 08/09/2012   Procedure: ANTERIOR CERVICAL DECOMPRESSION/DISCECTOMY FUSION 1 LEVEL;  Surgeon: Floyce Stakes, MD;  Location: [facility];  Service: Neurosurgery;  Laterality: N/A;  Cervical four-five  Anterior cervical decompression/diskectomy/fusion  . ANTERIOR FUSION CERVICAL SPINE  2007   C5 -6  . CARPAL TUNNEL RELEASE  RIGHT - 2001  & DEC 2011 W/ BACK SURG.  . CERVICAL DISC SURGERY  2005   C5 - 6  . CHOLECYSTECTOMY  1994  . DORSAL COMPARTMENT RELEASE Right 04/07/2013   Procedure: RIGHT WRIST STS RELEASE;  Surgeon: Rodman Key  Roque Cash, MD;  Location: [place];  Service: Orthopedics;  Laterality: Right;  . EXPLORATION OF INCISION FOR CSF LEAK  DEC 2011  X2   POST LAMINECOTMY  . FINGER ARTHRODESIS Right 04/07/2013   Procedure: RIGHT INDEX AND RIGHT LONG DISTAL INTERPHALANGEAL JOINT FUSIONS;  Surgeon: Schuyler Amor, MD;  Location: [place];  Service: Orthopedics;  Laterality: Right;  . FINGER ARTHROPLASTY Right 04/07/2013   Procedure: RIGHT THUMB [place] ARTHROPLASTY;  Surgeon: Schuyler Amor, MD;  Location: [place];  Service: Orthopedics;  Laterality: Right;  . GANGLION CYST EXCISION  Right 04/07/2013   Procedure: RIGHT WRIST MASS EXCISION;  Surgeon: Schuyler Amor, MD;  Location: [place];  Service: Orthopedics;  Laterality: Right;  . KNEE ARTHROSCOPY  LEFT X3 (LAST ONE 2005)  . KNEE ARTHROSCOPY  05/17/2011   Procedure: ARTHROSCOPY KNEE;  Surgeon: Bradley Ferris III;  Location: [place];  Service: Orthopedics;  Laterality: Right;  WITH MEDIAL MENISECTOMY AND removal of suprapatella fat lump  . LEFT WRIST TENOSYNECTOMY W/ LEFT THUMB JOINT REPAIR  02-24-10  . LUMBAR FUSION  11-30-10   L4 - 5  . LUMBAR FUSION  2000   L2 - 4  . LUMBAR LAMINECTOMY  DEC 2011   L4 - 5  . TENDON REPAIR  JAN 2010   LEFT INDEX AND LONG FINGERS   Past Medical History:  Diagnosis Date  . Acute sinusitis, unspecified   . Anxiety state, unspecified   . Arachnoiditis BILATERAL LEGS   DUE TO MULTIPLE BACK SURG.'S  . Arthritis   . Asthma   . Blood transfusion   . Cardiomyopathy HX --06/2010   EF was 25% during acute illness (PHELONEPHRITIS) Repeat echo 12-06-10 60% showed normal EF.   Marland Kitchen Chronic back pain greater than 3 months duration    S/P BACK SURG'S  . Dyslipidemia   . Dysphagia    some post op cerv fusion 2/14  . Essential hypertension, benign   . GERD (gastroesophageal reflux disease) AND HIATIAL HERNIA   CONTROLLED W/ NEXIUM  . Headache(784.0)   . Heart murmur    DENIES S & S   (ECHO JUN'12 W/ CHART)  . History of chronic bronchitis   . Hx of bladder infections   . Hypertension   . Murmur, heart   . Neuromuscular disorder (HCC)    numbness and tingling  . Osteoporosis   . Other malaise and fatigue   . PONV (postoperative nausea and vomiting)   . Shortness of breath   . Varicosities    venous  . Weakness of both legs DUE TO ARACHNOIDITIS   OCCASIONAL USES CANE   BP 131/83   Pulse 91   Resp 14   SpO2 99%   Opioid Risk Score:   Fall Risk Score:  `1  Depression screen PHQ 2/9  Depression screen [facility] 2/9 01/20/2016 12/20/2015  07/05/2015 03/08/2015 01/06/2015 09/16/2014  Decreased Interest 0 0 1 1 1 1   Down, Depressed, Hopeless 0 0 1 1 1 1   PHQ - 2 Score 0 0 2 2 2 2   Altered sleeping - - - - - 2  Tired, decreased energy - - - - - 2  Change in appetite - - - - - 1  Feeling bad or failure about yourself  - - - - - 1  Trouble concentrating - - - - - 2  Moving slowly or fidgety/restless - - - - - 0  Suicidal thoughts - - - - - 0  PHQ-9 Score - - - - - 10    Review of Systems  Constitutional: Negative.   HENT: Negative.   Eyes: Negative.   Cardiovascular: Negative.   Gastrointestinal: Negative.   Endocrine: Negative.   Genitourinary: Negative.   Musculoskeletal: Negative.   Skin: Negative.   Allergic/Immunologic: Negative.   Neurological: Negative.   Hematological: Negative.   Psychiatric/Behavioral: Negative.   All other systems reviewed and are negative.      Objective:   Physical Exam  Constitutional: She appears well-developed and well-nourished.  HENT:  Head: Normocephalic and atraumatic.  Neck: Normal range of motion. Neck supple.  Cervical Paraspinal Tenderness: C-5-C-6   Cardiovascular: Normal rate and regular rhythm.   Pulmonary/Chest: Effort normal and breath sounds normal.  Musculoskeletal:  Normal Muscle Bulk and Muscle Testing Reveals: Upper Extremities: Full ROM and Muscle Strength 5/5  Bilateral AC Joint Tenderness Thoracic and Lumbar Hypersensitivity Lower Extremities: Full ROM and Muscle Strength 5/5 Arises from Chair slowly Narrow based Gait    Neurological: She is alert.  Skin: Skin is warm and dry.  Psychiatric: She has a normal mood and affect.  Nursing note and vitals reviewed.         Assessment & Plan:  1. 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  Refilled:Oxycodone 15mg  one tablet every 6 hours as needed #120 and Opana 40 mg one tablet every 12 hours # 60 . Second script given for the following month. We will continue  the opioid monitoring program, this consists of regular clinic visits, examinations, urine drug screen, pill counts as well as use of [place] Controlled Substance Reporting System. 2. 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 Dr. Joya Salm Following. Continue Gabapentin 600mg  QID. Encourage Posture and Stretching Exercise.  3. Anxiety/depression: Continue Valium and monitor.  4. Muscle Spasms: Continue to Monitor 5. Neck Pain/ Radiculopathy: Dr. Joya Salm Following  20 minutes of face to face patient care time was spent during this visit. All questions were encouraged and answered.   F/U in 1 month

## 2016-06-21 ENCOUNTER — Telehealth: Payer: Self-pay | Admitting: Registered Nurse

## 2016-06-21 ENCOUNTER — Telehealth: Payer: Self-pay

## 2016-06-21 ENCOUNTER — Encounter: Payer: Self-pay | Admitting: Registered Nurse

## 2016-06-21 NOTE — Telephone Encounter (Signed)
Return Ms. Gittins call, no answer. We will proceed with the Prior Authorization, left message for Ms. Bransfield.

## 2016-06-21 NOTE — Telephone Encounter (Signed)
Patient called this am stating that she was able to get  her OxyContin filled and that she was approved to take the [facility] as well. Patient also states if she is written a script she will bring medication OxyContin to our office to be destroyed because it makes her feel "itchy and nauseated.  Please advise.

## 2016-06-21 NOTE — Telephone Encounter (Signed)
Return Ms. Cespedes call, she will pick up her Exalgo prescription on Friday. She was asked to call office on Friday Morning 06/23/2016.

## 2016-06-21 NOTE — Telephone Encounter (Signed)
Placed a call to [name] [name], no awaiting a call back regarding her email.

## 2016-06-22 ENCOUNTER — Telehealth: Payer: Self-pay | Admitting: Registered Nurse

## 2016-06-22 MED ORDER — HYDROMORPHONE HCL ER 12 MG PO T24A: 12.0000 mg | EXTENDED_RELEASE_TABLET | Freq: Two times a day (BID) | ORAL | 0 refills | Status: DC

## 2016-06-22 NOTE — Telephone Encounter (Signed)
Prescription  Printed

## 2016-06-25 LAB — TOXASSURE SELECT,+ANTIDEPR,UR

## 2016-06-26 NOTE — Progress Notes (Signed)
Urine drug screen for this encounter is consistent for prescribed medication 

## 2016-07-06 DIAGNOSIS — I10 Essential (primary) hypertension: Secondary | ICD-10-CM | POA: Diagnosis not present

## 2016-07-06 DIAGNOSIS — J441 Chronic obstructive pulmonary disease with (acute) exacerbation: Secondary | ICD-10-CM | POA: Diagnosis not present

## 2016-07-06 DIAGNOSIS — E1165 Type 2 diabetes mellitus with hyperglycemia: Secondary | ICD-10-CM | POA: Diagnosis not present

## 2016-07-17 ENCOUNTER — Telehealth: Payer: Self-pay | Admitting: Registered Nurse

## 2016-07-17 ENCOUNTER — Encounter (HOSPITAL_BASED_OUTPATIENT_CLINIC_OR_DEPARTMENT_OTHER): Payer: Medicare Other | Admitting: Registered Nurse

## 2016-07-17 ENCOUNTER — Encounter: Payer: Self-pay | Admitting: Registered Nurse

## 2016-07-17 VITALS — BP 130/87 | HR 93

## 2016-07-17 DIAGNOSIS — G039 Meningitis, unspecified: Secondary | ICD-10-CM | POA: Diagnosis not present

## 2016-07-17 DIAGNOSIS — M546 Pain in thoracic spine: Secondary | ICD-10-CM | POA: Diagnosis not present

## 2016-07-17 DIAGNOSIS — M961 Postlaminectomy syndrome, not elsewhere classified: Secondary | ICD-10-CM | POA: Diagnosis not present

## 2016-07-17 DIAGNOSIS — M5441 Lumbago with sciatica, right side: Secondary | ICD-10-CM | POA: Diagnosis not present

## 2016-07-17 DIAGNOSIS — M5412 Radiculopathy, cervical region: Secondary | ICD-10-CM

## 2016-07-17 DIAGNOSIS — M5416 Radiculopathy, lumbar region: Secondary | ICD-10-CM | POA: Diagnosis not present

## 2016-07-17 DIAGNOSIS — Z5181 Encounter for therapeutic drug level monitoring: Secondary | ICD-10-CM

## 2016-07-17 DIAGNOSIS — Z79899 Other long term (current) drug therapy: Secondary | ICD-10-CM | POA: Diagnosis not present

## 2016-07-17 DIAGNOSIS — G894 Chronic pain syndrome: Secondary | ICD-10-CM

## 2016-07-17 MED ORDER — HYDROMORPHONE HCL ER 12 MG PO T24A: 12.0000 mg | EXTENDED_RELEASE_TABLET | Freq: Two times a day (BID) | ORAL | 0 refills | Status: DC

## 2016-07-17 MED ORDER — OXYCODONE HCL 15 MG PO TABS: 15.0000 mg | ORAL_TABLET | Freq: Four times a day (QID) | ORAL | 0 refills | Status: DC | PRN

## 2016-07-17 NOTE — Telephone Encounter (Signed)
On January 29,2018 the [place] was reviewed no conflict was seen on the [place] with multiple prescribers. Ms. Redden has a signed narcotic contract with our office. If there were any discrepancies this would have been reported to her physician.

## 2016-07-17 NOTE — Progress Notes (Signed)
Subjective:    Patient ID: [name] [name], female    DOB: [birth year], 66 y.o.   MRN: [id number]  HPI: [name] is a 66year old female who returns for follow up appointment for chronic pain and medication refill. She states her pain is located in her neck radiating into her bilateral shoulders and bilateral arms to her elbows she states,and lower back radiating into her bilateral buttocks and lower extremities posteriorly. She rates her pain 8. Her current exercise regime is walking and performing stretching exercises.    Pain Inventory Average Pain 8 Pain Right Now 8 My pain is sharp, burning, dull, tingling and aching  In the last 24 hours, has pain interfered with the following? General activity 7 Relation with others 7 Enjoyment of life 7 What TIME of day is your pain at its worst? all Sleep (in general) Fair  Pain is worse with: walking, sitting, standing and some activites Pain improves with: pacing activities and medication Relief from Meds: 7  Mobility walk without assistance ability to climb steps?  yes do you drive?  yes Do you have any goals in this area?  no  Function Do you have any goals in this area?  yes  Neuro/Psych No problems in this area  Prior Studies Any changes since last visit?  no  Physicians involved in your care Any changes since last visit?  no   Family History  Problem Relation Age of Onset  . Hypertension Mother   . Heart disease Mother   . Diabetes Mother   . Cancer Father   . Diabetes Brother    Social History   Social History  . Marital status: Married    Spouse name: N/A  . Number of children: N/A  . Years of education: N/A   Occupational History  . disable Disabled   Social History Main Topics  . Smoking status: Never Smoker  . Smokeless tobacco: Never Used  . Alcohol use No  . Drug use: No  . Sexual activity: Not on file     Comment: second hand smoke   Other Topics Concern  . Not on file   Social  History Narrative   Has one child   Disabled   Past Surgical History:  Procedure Laterality Date  . ABDOMINAL HYSTERECTOMY  1987   W/ BSO  . ANTERIOR CERVICAL DECOMP/DISCECTOMY FUSION N/A 08/09/2012   Procedure: ANTERIOR CERVICAL DECOMPRESSION/DISCECTOMY FUSION 1 LEVEL;  Surgeon: Floyce Stakes, MD;  Location: [facility];  Service: Neurosurgery;  Laterality: N/A;  Cervical four-five  Anterior cervical decompression/diskectomy/fusion  . ANTERIOR FUSION CERVICAL SPINE  2007   C5 -6  . CARPAL TUNNEL RELEASE  RIGHT - 2001  & DEC 2011 W/ BACK SURG.  . CERVICAL DISC SURGERY  2005   C5 - 6  . CHOLECYSTECTOMY  1994  . DORSAL COMPARTMENT RELEASE Right 04/07/2013   Procedure: RIGHT WRIST STS RELEASE;  Surgeon: Schuyler Amor, MD;  Location: [place];  Service: Orthopedics;  Laterality: Right;  . EXPLORATION OF INCISION FOR CSF LEAK  DEC 2011  X2   POST LAMINECOTMY  . FINGER ARTHRODESIS Right 04/07/2013   Procedure: RIGHT INDEX AND RIGHT LONG DISTAL INTERPHALANGEAL JOINT FUSIONS;  Surgeon: Schuyler Amor, MD;  Location: [place];  Service: Orthopedics;  Laterality: Right;  . FINGER ARTHROPLASTY Right 04/07/2013   Procedure: RIGHT THUMB [place] ARTHROPLASTY;  Surgeon: Schuyler Amor, MD;  Location: [place];  Service:  Orthopedics;  Laterality: Right;  . GANGLION CYST EXCISION Right 04/07/2013   Procedure: RIGHT WRIST MASS EXCISION;  Surgeon: Schuyler Amor, MD;  Location: [place];  Service: Orthopedics;  Laterality: Right;  . KNEE ARTHROSCOPY  LEFT X3 (LAST ONE 2005)  . KNEE ARTHROSCOPY  05/17/2011   Procedure: ARTHROSCOPY KNEE;  Surgeon: Bradley Ferris III;  Location: [place];  Service: Orthopedics;  Laterality: Right;  WITH MEDIAL MENISECTOMY AND removal of suprapatella fat lump  . LEFT WRIST TENOSYNECTOMY W/ LEFT THUMB JOINT REPAIR  02-24-10  . LUMBAR FUSION  11-30-10   L4 - 5  . LUMBAR  FUSION  2000   L2 - 4  . LUMBAR LAMINECTOMY  DEC 2011   L4 - 5  . TENDON REPAIR  JAN 2010   LEFT INDEX AND LONG FINGERS   Past Medical History:  Diagnosis Date  . Acute sinusitis, unspecified   . Anxiety state, unspecified   . Arachnoiditis BILATERAL LEGS   DUE TO MULTIPLE BACK SURG.'S  . Arthritis   . Asthma   . Blood transfusion   . Cardiomyopathy HX --06/2010   EF was 25% during acute illness (PHELONEPHRITIS) Repeat echo 12-06-10 60% showed normal EF.   Marland Kitchen Chronic back pain greater than 3 months duration    S/P BACK SURG'S  . Dyslipidemia   . Dysphagia    some post op cerv fusion 2/14  . Essential hypertension, benign   . GERD (gastroesophageal reflux disease) AND HIATIAL HERNIA   CONTROLLED W/ NEXIUM  . Headache(784.0)   . Heart murmur    DENIES S & S   (ECHO JUN'12 W/ CHART)  . History of chronic bronchitis   . Hx of bladder infections   . Hypertension   . Murmur, heart   . Neuromuscular disorder (HCC)    numbness and tingling  . Osteoporosis   . Other malaise and fatigue   . PONV (postoperative nausea and vomiting)   . Shortness of breath   . Varicosities    venous  . Weakness of both legs DUE TO ARACHNOIDITIS   OCCASIONAL USES CANE   There were no vitals taken for this visit.  Opioid Risk Score:   Fall Risk Score:  `1  Depression screen PHQ 2/9  Depression screen [facility] 2/9 01/20/2016 12/20/2015 07/05/2015 03/08/2015 01/06/2015 09/16/2014  Decreased Interest 0 0 1 1 1 1   Down, Depressed, Hopeless 0 0 1 1 1 1   PHQ - 2 Score 0 0 2 2 2 2   Altered sleeping - - - - - 2  Tired, decreased energy - - - - - 2  Change in appetite - - - - - 1  Feeling bad or failure about yourself  - - - - - 1  Trouble concentrating - - - - - 2  Moving slowly or fidgety/restless - - - - - 0  Suicidal thoughts - - - - - 0  PHQ-9 Score - - - - - 10   Review of Systems  Constitutional: Negative.   HENT: Negative.   Eyes: Negative.   Respiratory: Negative.   Cardiovascular: Negative.    Gastrointestinal: Negative.   Endocrine: Negative.   Genitourinary: Negative.   Musculoskeletal: Positive for back pain.  Skin: Negative.   Allergic/Immunologic: Negative.   Neurological: Negative.   Hematological: Negative.   Psychiatric/Behavioral: Negative.   All other systems reviewed and are negative.      Objective:   Physical Exam  Constitutional: She is  oriented to person, place, and time. She appears well-developed and well-nourished.  HENT:  Head: Normocephalic and atraumatic.  Neck: Normal range of motion. Neck supple.  Cervical Paraspinal Tenderness: C-5-C-6  Cardiovascular: Normal rate and regular rhythm.   Pulmonary/Chest: Effort normal and breath sounds normal.  Musculoskeletal:  Normal Muscle Bulk and Muscle Testing Reveals: Upper Extremities: Full ROM and Muscle Strength 5/5 Thoracic Paraspinal Tenderness: T-1-T-3  T-7- T-10 Lumbar Hypersensitivity Lower Extremities: Full ROM and Muscle Strength 5/5 Arises from Table with ease Narrow Based Gait   Neurological: She is alert and oriented to person, place, and time.  Skin: Skin is warm and dry.  Psychiatric: She has a normal mood and affect.  Nursing note and vitals reviewed.         Assessment & Plan:  1. 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  Refilled:Oxycodone 15mg  one tablet every 6 hours as needed #120 and  Hydromorphone HCL t24A one table every 12 hours #60. We will continue the opioid monitoring program, this consists of regular clinic visits, examinations, urine drug screen, pill counts as well as use of [place] Controlled Substance Reporting System. 2. 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 Dr. Joya Salm Following. Continue Gabapentin 600mg  QID. Encourage Posture and Stretching Exercise.  3. Anxiety/depression: Continue Valium and monitor.  4. Muscle Spasms: Continue to Monitor 5. Neck Pain/ Radiculopathy: Dr.  Joya Salm Following  20 minutes of face to face patient care time was spent during this visit. All questions were encouraged and answered.   F/U in 1 month

## 2016-08-11 ENCOUNTER — Encounter: Payer: Self-pay | Admitting: Registered Nurse

## 2016-08-11 ENCOUNTER — Encounter: Payer: Medicare Other | Attending: Physical Medicine & Rehabilitation | Admitting: Registered Nurse

## 2016-08-11 VITALS — BP 126/84 | HR 100

## 2016-08-11 DIAGNOSIS — Z5181 Encounter for therapeutic drug level monitoring: Secondary | ICD-10-CM

## 2016-08-11 DIAGNOSIS — G039 Meningitis, unspecified: Secondary | ICD-10-CM

## 2016-08-11 DIAGNOSIS — M5412 Radiculopathy, cervical region: Secondary | ICD-10-CM

## 2016-08-11 DIAGNOSIS — M5441 Lumbago with sciatica, right side: Secondary | ICD-10-CM | POA: Insufficient documentation

## 2016-08-11 DIAGNOSIS — Z79899 Other long term (current) drug therapy: Secondary | ICD-10-CM | POA: Diagnosis not present

## 2016-08-11 DIAGNOSIS — M961 Postlaminectomy syndrome, not elsewhere classified: Secondary | ICD-10-CM | POA: Diagnosis not present

## 2016-08-11 DIAGNOSIS — G894 Chronic pain syndrome: Secondary | ICD-10-CM | POA: Diagnosis not present

## 2016-08-11 DIAGNOSIS — M546 Pain in thoracic spine: Secondary | ICD-10-CM

## 2016-08-11 DIAGNOSIS — M5416 Radiculopathy, lumbar region: Secondary | ICD-10-CM

## 2016-08-11 MED ORDER — HYDROMORPHONE HCL ER 12 MG PO T24A: 12.0000 mg | EXTENDED_RELEASE_TABLET | Freq: Two times a day (BID) | ORAL | 0 refills | Status: DC

## 2016-08-11 MED ORDER — OXYCODONE HCL 15 MG PO TABS: 15.0000 mg | ORAL_TABLET | Freq: Four times a day (QID) | ORAL | 0 refills | Status: DC | PRN

## 2016-08-11 NOTE — Progress Notes (Signed)
Subjective:    Patient ID: [name] [name], female    DOB: [birth year], 66 y.o.   MRN: [id number]  HPI: [name] is a 66year old female who returns for follow up appointmentfor chronic pain and medication refill. She states her pain is located in her neck radiating into her bilateral shoulders and mid- lower back radiating into her bilateral lower extremities posteriorly.She rates her pain 7. Her current exercise regime is walking and performing stretching exercises.   Pain Inventory Average Pain 7 Pain Right Now 7 My pain is sharp, burning, stabbing, tingling and aching  In the last 24 hours, has pain interfered with the following? General activity 7 Relation with others 7 Enjoyment of life 7 What TIME of day is your pain at its worst? all Sleep (in general) Fair  Pain is worse with: walking, sitting, inactivity, standing and some activites Pain improves with: rest and medication Relief from Meds: 6  Mobility walk without assistance  Function Do you have any goals in this area?  no  Neuro/Psych No problems in this area  Prior Studies Any changes since last visit?  no  Physicians involved in your care Any changes since last visit?  no   Family History  Problem Relation Age of Onset  . Hypertension Mother   . Heart disease Mother   . Diabetes Mother   . Cancer Father   . Diabetes Brother    Social History   Social History  . Marital status: Married    Spouse name: N/A  . Number of children: N/A  . Years of education: N/A   Occupational History  . disable Disabled   Social History Main Topics  . Smoking status: Never Smoker  . Smokeless tobacco: Never Used  . Alcohol use No  . Drug use: No  . Sexual activity: Not on file     Comment: second hand smoke   Other Topics Concern  . Not on file   Social History Narrative   Has one child   Disabled   Past Surgical History:  Procedure Laterality Date  . ABDOMINAL HYSTERECTOMY  1987   W/  BSO  . ANTERIOR CERVICAL DECOMP/DISCECTOMY FUSION N/A 08/09/2012   Procedure: ANTERIOR CERVICAL DECOMPRESSION/DISCECTOMY FUSION 1 LEVEL;  Surgeon: Floyce Stakes, MD;  Location: [facility];  Service: Neurosurgery;  Laterality: N/A;  Cervical four-five  Anterior cervical decompression/diskectomy/fusion  . ANTERIOR FUSION CERVICAL SPINE  2007   C5 -6  . CARPAL TUNNEL RELEASE  RIGHT - 2001  & DEC 2011 W/ BACK SURG.  . CERVICAL DISC SURGERY  2005   C5 - 6  . CHOLECYSTECTOMY  1994  . DORSAL COMPARTMENT RELEASE Right 04/07/2013   Procedure: RIGHT WRIST STS RELEASE;  Surgeon: Schuyler Amor, MD;  Location: [place];  Service: Orthopedics;  Laterality: Right;  . EXPLORATION OF INCISION FOR CSF LEAK  DEC 2011  X2   POST LAMINECOTMY  . FINGER ARTHRODESIS Right 04/07/2013   Procedure: RIGHT INDEX AND RIGHT LONG DISTAL INTERPHALANGEAL JOINT FUSIONS;  Surgeon: Schuyler Amor, MD;  Location: [place];  Service: Orthopedics;  Laterality: Right;  . FINGER ARTHROPLASTY Right 04/07/2013   Procedure: RIGHT THUMB [place] ARTHROPLASTY;  Surgeon: Schuyler Amor, MD;  Location: [place];  Service: Orthopedics;  Laterality: Right;  . GANGLION CYST EXCISION Right 04/07/2013   Procedure: RIGHT WRIST MASS EXCISION;  Surgeon: Schuyler Amor, MD;  Location: [place];  Service:  Orthopedics;  Laterality: Right;  . KNEE ARTHROSCOPY  LEFT X3 (LAST ONE 2005)  . KNEE ARTHROSCOPY  05/17/2011   Procedure: ARTHROSCOPY KNEE;  Surgeon: Bradley Ferris III;  Location: [place];  Service: Orthopedics;  Laterality: Right;  WITH MEDIAL MENISECTOMY AND removal of suprapatella fat lump  . LEFT WRIST TENOSYNECTOMY W/ LEFT THUMB JOINT REPAIR  02-24-10  . LUMBAR FUSION  11-30-10   L4 - 5  . LUMBAR FUSION  2000   L2 - 4  . LUMBAR LAMINECTOMY  DEC 2011   L4 - 5  . TENDON REPAIR  JAN 2010   LEFT INDEX AND LONG FINGERS   Past Medical  History:  Diagnosis Date  . Acute sinusitis, unspecified   . Anxiety state, unspecified   . Arachnoiditis BILATERAL LEGS   DUE TO MULTIPLE BACK SURG.'S  . Arthritis   . Asthma   . Blood transfusion   . Cardiomyopathy HX --06/2010   EF was 25% during acute illness (PHELONEPHRITIS) Repeat echo 12-06-10 60% showed normal EF.   Marland Kitchen Chronic back pain greater than 3 months duration    S/P BACK SURG'S  . Dyslipidemia   . Dysphagia    some post op cerv fusion 2/14  . Essential hypertension, benign   . GERD (gastroesophageal reflux disease) AND HIATIAL HERNIA   CONTROLLED W/ NEXIUM  . Headache(784.0)   . Heart murmur    DENIES S & S   (ECHO JUN'12 W/ CHART)  . History of chronic bronchitis   . Hx of bladder infections   . Hypertension   . Murmur, heart   . Neuromuscular disorder (HCC)    numbness and tingling  . Osteoporosis   . Other malaise and fatigue   . PONV (postoperative nausea and vomiting)   . Shortness of breath   . Varicosities    venous  . Weakness of both legs DUE TO ARACHNOIDITIS   OCCASIONAL USES CANE   There were no vitals taken for this visit.  Opioid Risk Score:   Fall Risk Score:  `1  Depression screen PHQ 2/9  Depression screen [facility] 2/9 01/20/2016 12/20/2015 07/05/2015 03/08/2015 01/06/2015 09/16/2014  Decreased Interest 0 0 1 1 1 1   Down, Depressed, Hopeless 0 0 1 1 1 1   PHQ - 2 Score 0 0 2 2 2 2   Altered sleeping - - - - - 2  Tired, decreased energy - - - - - 2  Change in appetite - - - - - 1  Feeling bad or failure about yourself  - - - - - 1  Trouble concentrating - - - - - 2  Moving slowly or fidgety/restless - - - - - 0  Suicidal thoughts - - - - - 0  PHQ-9 Score - - - - - 10    Review of Systems  Constitutional: Negative.   HENT: Negative.   Eyes: Negative.   Respiratory: Negative.   Cardiovascular: Negative.   Gastrointestinal: Negative.   Endocrine: Negative.   Genitourinary: Negative.   Musculoskeletal: Positive for back pain.  Skin:  Negative.   Allergic/Immunologic: Negative.   Neurological: Negative.   Hematological: Negative.   Psychiatric/Behavioral: Negative.   All other systems reviewed and are negative.      Objective:   Physical Exam  Constitutional: She is oriented to person, place, and time. She appears well-developed and well-nourished.  HENT:  Head: Normocephalic and atraumatic.  Neck: Normal range of motion. Neck supple.  Cardiovascular: Normal rate and  regular rhythm.   Pulmonary/Chest: Effort normal and breath sounds normal.  Musculoskeletal:  Normal Muscle Bulk and Muscle Testing Reveals:  Upper Extremities: Full ROM and Muscle Strength 5/5 Lumbar Hypersensitivity Lower Extremities: Full ROM and Muscle Strength 5/5 Arises from Table with ease Narrow Based Gait  Neurological: She is alert and oriented to person, place, and time.  Skin: Skin is warm and dry.  Psychiatric: She has a normal mood and affect.  Nursing note and vitals reviewed.         Assessment & Plan:  1. 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 08/11/2016 Refilled:Oxycodone 15mg  one tablet every 6 hours as needed #120 and  Hydromorphone HCL t24A one table every 12 hours #60. We will continue the opioid monitoring program, this consists of regular clinic visits, examinations, urine drug screen, pill counts as well as use of [place] Controlled Substance Reporting System. 2. 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 Continue Gabapentin 600mg  QID. Encourage Posture and Stretching Exercise. -08/11/2016 3. Anxiety/depression: Continue Valium and monitor. 08/11/2016 4. Muscle Spasms: Continue to Monitor 5. Neck Pain/ Radiculopathy: Dr. Joya Salm has retired, Ms Halgren she would like to speak with Dr. Naaman Plummer for recommendation for a Neurosurgeon.   20  minutes of face to face patient care time was spent during this visit. All questions were encouraged and  answered. F/U in 36month

## 2016-09-11 ENCOUNTER — Encounter: Payer: Medicare Other | Attending: Physical Medicine & Rehabilitation | Admitting: Registered Nurse

## 2016-09-11 ENCOUNTER — Encounter: Payer: Self-pay | Admitting: Registered Nurse

## 2016-09-11 VITALS — BP 136/85 | HR 95

## 2016-09-11 DIAGNOSIS — M5441 Lumbago with sciatica, right side: Secondary | ICD-10-CM | POA: Diagnosis not present

## 2016-09-11 DIAGNOSIS — Z79899 Other long term (current) drug therapy: Secondary | ICD-10-CM | POA: Insufficient documentation

## 2016-09-11 DIAGNOSIS — G039 Meningitis, unspecified: Secondary | ICD-10-CM | POA: Insufficient documentation

## 2016-09-11 DIAGNOSIS — M961 Postlaminectomy syndrome, not elsewhere classified: Secondary | ICD-10-CM | POA: Insufficient documentation

## 2016-09-11 DIAGNOSIS — M546 Pain in thoracic spine: Secondary | ICD-10-CM | POA: Diagnosis not present

## 2016-09-11 DIAGNOSIS — M5416 Radiculopathy, lumbar region: Secondary | ICD-10-CM | POA: Diagnosis not present

## 2016-09-11 DIAGNOSIS — G894 Chronic pain syndrome: Secondary | ICD-10-CM

## 2016-09-11 DIAGNOSIS — Z5181 Encounter for therapeutic drug level monitoring: Secondary | ICD-10-CM | POA: Insufficient documentation

## 2016-09-11 DIAGNOSIS — M5412 Radiculopathy, cervical region: Secondary | ICD-10-CM

## 2016-09-11 MED ORDER — HYDROMORPHONE HCL ER 12 MG PO T24A: 12.0000 mg | EXTENDED_RELEASE_TABLET | Freq: Two times a day (BID) | ORAL | 0 refills | Status: DC

## 2016-09-11 MED ORDER — OXYCODONE HCL 15 MG PO TABS: 15.0000 mg | ORAL_TABLET | Freq: Four times a day (QID) | ORAL | 0 refills | Status: DC | PRN

## 2016-09-11 NOTE — Progress Notes (Signed)
Subjective:    Patient ID: [name] [name], female    DOB: 09/16/1950, 66 y.o.   MRN: [id number]  HPI: [name] is a 66year old female who returns for follow up appointmentfor chronic pain and medication refill. She states her pain is located in her neck radiating into her bilateral shoulders and mid- lower back radiating into her bilateral lower extremities posteriorly.She rates her pain 8. Her current exercise regime is walking and performing stretching exercises.  Also states she is getting over a head cold, she's taking over the counter cough syrup.    Pain Inventory Average Pain 8 Pain Right Now 8 My pain is sharp, burning, stabbing, tingling and aching  In the last 24 hours, has pain interfered with the following? General activity 7 Relation with others 8 Enjoyment of life 7 What TIME of day is your pain at its worst? all times Sleep (in general) Fair  Pain is worse with: walking, bending, sitting, standing and some activites Pain improves with: heat/ice and medication Relief from Meds: 7  Mobility Do you have any goals in this area?  no  Function disabled: date disabled . Do you have any goals in this area?  no  Neuro/Psych No problems in this area  Prior Studies Any changes since last visit?  no  Physicians involved in your care Any changes since last visit?  no   Family History  Problem Relation Age of Onset  . Hypertension Mother   . Heart disease Mother   . Diabetes Mother   . Cancer Father   . Diabetes Brother    Social History   Social History  . Marital status: Married    Spouse name: N/A  . Number of children: N/A  . Years of education: N/A   Occupational History  . disable Disabled   Social History Main Topics  . Smoking status: Never Smoker  . Smokeless tobacco: Never Used  . Alcohol use No  . Drug use: No  . Sexual activity: Not Asked     Comment: second hand smoke   Other Topics Concern  . None   Social History  Narrative   Has one child   Disabled   Past Surgical History:  Procedure Laterality Date  . ABDOMINAL HYSTERECTOMY  1987   W/ BSO  . ANTERIOR CERVICAL DECOMP/DISCECTOMY FUSION N/A 08/09/2012   Procedure: ANTERIOR CERVICAL DECOMPRESSION/DISCECTOMY FUSION 1 LEVEL;  Surgeon: Floyce Stakes, MD;  Location: [facility];  Service: Neurosurgery;  Laterality: N/A;  Cervical four-five  Anterior cervical decompression/diskectomy/fusion  . ANTERIOR FUSION CERVICAL SPINE  2007   C5 -6  . CARPAL TUNNEL RELEASE  RIGHT - 2001  & DEC 2011 W/ BACK SURG.  . CERVICAL DISC SURGERY  2005   C5 - 6  . CHOLECYSTECTOMY  1994  . DORSAL COMPARTMENT RELEASE Right 04/07/2013   Procedure: RIGHT WRIST STS RELEASE;  Surgeon: Schuyler Amor, MD;  Location: [place];  Service: Orthopedics;  Laterality: Right;  . EXPLORATION OF INCISION FOR CSF LEAK  DEC 2011  X2   POST LAMINECOTMY  . FINGER ARTHRODESIS Right 04/07/2013   Procedure: RIGHT INDEX AND RIGHT LONG DISTAL INTERPHALANGEAL JOINT FUSIONS;  Surgeon: Schuyler Amor, MD;  Location: [place];  Service: Orthopedics;  Laterality: Right;  . FINGER ARTHROPLASTY Right 04/07/2013   Procedure: RIGHT THUMB [place] ARTHROPLASTY;  Surgeon: Schuyler Amor, MD;  Location: [place];  Service: Orthopedics;  Laterality: Right;  .  GANGLION CYST EXCISION Right 04/07/2013   Procedure: RIGHT WRIST MASS EXCISION;  Surgeon: Schuyler Amor, MD;  Location: [place];  Service: Orthopedics;  Laterality: Right;  . KNEE ARTHROSCOPY  LEFT X3 (LAST ONE 2005)  . KNEE ARTHROSCOPY  05/17/2011   Procedure: ARTHROSCOPY KNEE;  Surgeon: Bradley Ferris III;  Location: [place];  Service: Orthopedics;  Laterality: Right;  WITH MEDIAL MENISECTOMY AND removal of suprapatella fat lump  . LEFT WRIST TENOSYNECTOMY W/ LEFT THUMB JOINT REPAIR  02-24-10  . LUMBAR FUSION  11-30-10   L4 - 5  . LUMBAR FUSION  2000     L2 - 4  . LUMBAR LAMINECTOMY  DEC 2011   L4 - 5  . TENDON REPAIR  JAN 2010   LEFT INDEX AND LONG FINGERS   Past Medical History:  Diagnosis Date  . Acute sinusitis, unspecified   . Anxiety state, unspecified   . Arachnoiditis BILATERAL LEGS   DUE TO MULTIPLE BACK SURG.'S  . Arthritis   . Asthma   . Blood transfusion   . Cardiomyopathy HX --06/2010   EF was 25% during acute illness (PHELONEPHRITIS) Repeat echo 12-06-10 60% showed normal EF.   Marland Kitchen Chronic back pain greater than 3 months duration    S/P BACK SURG'S  . Dyslipidemia   . Dysphagia    some post op cerv fusion 2/14  . Essential hypertension, benign   . GERD (gastroesophageal reflux disease) AND HIATIAL HERNIA   CONTROLLED W/ NEXIUM  . Headache(784.0)   . Heart murmur    DENIES S & S   (ECHO JUN'12 W/ CHART)  . History of chronic bronchitis   . Hx of bladder infections   . Hypertension   . Murmur, heart   . Neuromuscular disorder (HCC)    numbness and tingling  . Osteoporosis   . Other malaise and fatigue   . PONV (postoperative nausea and vomiting)   . Shortness of breath   . Varicosities    venous  . Weakness of both legs DUE TO ARACHNOIDITIS   OCCASIONAL USES CANE   BP 136/85   Pulse 95   SpO2 95%   Opioid Risk Score:   Fall Risk Score:  `1  Depression screen PHQ 2/9  Depression screen [facility] 2/9 01/20/2016 12/20/2015 07/05/2015 03/08/2015 01/06/2015 09/16/2014  Decreased Interest 0 0 1 1 1 1   Down, Depressed, Hopeless 0 0 1 1 1 1   PHQ - 2 Score 0 0 2 2 2 2   Altered sleeping - - - - - 2  Tired, decreased energy - - - - - 2  Change in appetite - - - - - 1  Feeling bad or failure about yourself  - - - - - 1  Trouble concentrating - - - - - 2  Moving slowly or fidgety/restless - - - - - 0  Suicidal thoughts - - - - - 0  PHQ-9 Score - - - - - 10  Some recent data might be hidden    Review of Systems  Constitutional: Negative.   HENT: Negative.   Eyes: Negative.   Respiratory: Negative.    Cardiovascular: Negative.   Gastrointestinal: Negative.   Endocrine: Negative.   Genitourinary: Negative.   Musculoskeletal: Positive for back pain.  Skin: Negative.   Allergic/Immunologic: Negative.   Neurological: Negative.   Hematological: Negative.   Psychiatric/Behavioral: Negative.        Objective:   Physical Exam  Constitutional: She is oriented  to person, place, and time. She appears well-developed and well-nourished.  HENT:  Head: Normocephalic and atraumatic.  Neck: Normal range of motion. Neck supple.  Cervical Paraspinal Tenderness: C-5-C-6  Cardiovascular: Normal rate and regular rhythm.   Pulmonary/Chest: Effort normal and breath sounds normal.  Musculoskeletal:  Normal Muscle Bulk and Muscle Testing Reveals: Upper Extremities: Full ROM and Muscle Strength 5/5 Thoracic Paraspinal Tenderness: T-1-T-3 Lumbar Hypersensitivity Lower Extremities: Full ROM and Muscle Strength 5/5 Bilateral Lower Extremities Flexion Produces Pain into Patella's and Popliteal Fossa Arises from Table with ease Narrow Based gait  Neurological: She is alert and oriented to person, place, and time.  Skin: Skin is warm and dry.  Psychiatric: She has a normal mood and affect.  Nursing note and vitals reviewed.         Assessment & Plan:  1. 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 09/11/2016 Refilled:Oxycodone 15mg  one tablet every 6 hours as needed #120 and  Hydromorphone HCL t24A one table every 12 hours #60. We will continue the opioid monitoring program, this consists of regular clinic visits, examinations, urine drug screen, pill counts as well as use of [place] Controlled Substance Reporting System. 2. 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 Continue Gabapentin 600mg  QID. Encourage Posture and Stretching Exercise. 09/11/2016 3. Anxiety/depression: Continue Valium and monitor. 09/11/2016 4. Muscle  Spasms: Continue to Monitor. 09/11/2016 5. Neck Pain/ Radiculopathy: Dr. Joya Salm has retired, Ms Hartner she would like to speak with Dr. Naaman Plummer for recommendation for a Neurosurgeon.  20 minutes of face to face patient care time was spent during this visit. All questions were encouraged and answered.  F/U in 1 month

## 2016-09-14 LAB — TOXASSURE SELECT,+ANTIDEPR,UR

## 2016-09-18 DIAGNOSIS — I10 Essential (primary) hypertension: Secondary | ICD-10-CM | POA: Diagnosis not present

## 2016-09-18 DIAGNOSIS — L570 Actinic keratosis: Secondary | ICD-10-CM | POA: Diagnosis not present

## 2016-09-18 DIAGNOSIS — E784 Other hyperlipidemia: Secondary | ICD-10-CM | POA: Diagnosis not present

## 2016-09-18 DIAGNOSIS — E1165 Type 2 diabetes mellitus with hyperglycemia: Secondary | ICD-10-CM | POA: Diagnosis not present

## 2016-09-20 ENCOUNTER — Other Ambulatory Visit: Payer: Self-pay | Admitting: Internal Medicine

## 2016-09-20 DIAGNOSIS — R29898 Other symptoms and signs involving the musculoskeletal system: Secondary | ICD-10-CM

## 2016-09-20 DIAGNOSIS — R5383 Other fatigue: Secondary | ICD-10-CM

## 2016-09-28 ENCOUNTER — Other Ambulatory Visit: Payer: Self-pay | Admitting: Registered Nurse

## 2016-09-28 ENCOUNTER — Other Ambulatory Visit: Payer: Self-pay

## 2016-09-28 ENCOUNTER — Other Ambulatory Visit: Payer: Self-pay | Admitting: Internal Medicine

## 2016-09-28 ENCOUNTER — Ambulatory Visit (INDEPENDENT_AMBULATORY_CARE_PROVIDER_SITE_OTHER): Payer: Medicare Other

## 2016-09-28 DIAGNOSIS — R011 Cardiac murmur, unspecified: Secondary | ICD-10-CM

## 2016-09-28 DIAGNOSIS — R29898 Other symptoms and signs involving the musculoskeletal system: Secondary | ICD-10-CM

## 2016-09-28 DIAGNOSIS — R5383 Other fatigue: Secondary | ICD-10-CM | POA: Diagnosis not present

## 2016-09-29 ENCOUNTER — Telehealth: Payer: Self-pay | Admitting: Registered Nurse

## 2016-09-29 NOTE — Telephone Encounter (Signed)
Recieved electronic medication refill request for amitriptyline, no mention in last notes to continue this medication, please advise.

## 2016-09-29 NOTE — Telephone Encounter (Signed)
[name] [name] has a UDS performed on 09/11/2016 it was consistent.

## 2016-10-09 ENCOUNTER — Encounter: Payer: Medicare Other | Attending: Physical Medicine & Rehabilitation | Admitting: Physical Medicine & Rehabilitation

## 2016-10-09 ENCOUNTER — Encounter: Payer: Self-pay | Admitting: Physical Medicine & Rehabilitation

## 2016-10-09 VITALS — BP 122/87 | HR 93 | Resp 14

## 2016-10-09 DIAGNOSIS — Z5181 Encounter for therapeutic drug level monitoring: Secondary | ICD-10-CM | POA: Diagnosis not present

## 2016-10-09 DIAGNOSIS — Z79899 Other long term (current) drug therapy: Secondary | ICD-10-CM | POA: Diagnosis not present

## 2016-10-09 DIAGNOSIS — M961 Postlaminectomy syndrome, not elsewhere classified: Secondary | ICD-10-CM

## 2016-10-09 DIAGNOSIS — M5441 Lumbago with sciatica, right side: Secondary | ICD-10-CM | POA: Diagnosis not present

## 2016-10-09 DIAGNOSIS — G039 Meningitis, unspecified: Secondary | ICD-10-CM

## 2016-10-09 MED ORDER — OXYCODONE HCL 15 MG PO TABS: 15.0000 mg | ORAL_TABLET | Freq: Four times a day (QID) | ORAL | 0 refills | Status: DC | PRN

## 2016-10-09 MED ORDER — OXYMORPHONE HCL ER 40 MG PO T12A: 40.0000 mg | EXTENDED_RELEASE_TABLET | Freq: Two times a day (BID) | ORAL | 0 refills | Status: DC

## 2016-10-09 MED ORDER — GABAPENTIN 600 MG PO TABS: 600.0000 mg | ORAL_TABLET | Freq: Four times a day (QID) | ORAL | 2 refills | Status: DC

## 2016-10-09 NOTE — Progress Notes (Signed)
Subjective:    Patient ID: [name] [name], female    DOB: 1950-12-30, 66 y.o.   MRN: [id number]  HPI  [name] [name] is here in follow up of her chronic pain. She has been on exalgo 24mg  for the past couple months. While it does provide some relief, the opana er was more effective. She is using gabapentin still and finds that this does provide some relief especially at night. She ran out of the elavil and did not refill it.   She spoke with her pharmacy who can stock the oxymorphone ER, and [name] is interested in going back on this. She is using oxycodone for breakthrough pain with some results.   She hasn't followed up recently with [facility] as her surgeon, Dr. Joya Salm, retired. She is deciding which of his partners she wold like to see for follow up care.   Otherwise, she has been doing fairly well. She tries to stay active when she can. She is ultimately limited by safety/balance/pain.   Pain Inventory Average Pain 8 Pain Right Now 8 My pain is sharp, burning, stabbing, tingling and aching  In the last 24 hours, has pain interfered with the following? General activity 8 Relation with others 7 Enjoyment of life 7 What TIME of day is your pain at its worst? all Sleep (in general) Poor  Pain is worse with: walking, bending, sitting, standing and some activites Pain improves with: rest and heat/ice Relief from Meds: 6  Mobility walk without assistance Do you have any goals in this area?  no  Function Do you have any goals in this area?  no  Neuro/Psych No problems in this area  Prior Studies Any changes since last visit?  no  Physicians involved in your care Any changes since last visit?  no   Family History  Problem Relation Age of Onset  . Hypertension Mother   . Heart disease Mother   . Diabetes Mother   . Cancer Father   . Diabetes Brother    Social History   Social History  . Marital status: Married    Spouse name: N/A  . Number of children: N/A  . Years of  education: N/A   Occupational History  . disable Disabled   Social History Main Topics  . Smoking status: Never Smoker  . Smokeless tobacco: Never Used  . Alcohol use No  . Drug use: No  . Sexual activity: Not Asked     Comment: second hand smoke   Other Topics Concern  . None   Social History Narrative   Has one child   Disabled   Past Surgical History:  Procedure Laterality Date  . ABDOMINAL HYSTERECTOMY  1987   W/ BSO  . ANTERIOR CERVICAL DECOMP/DISCECTOMY FUSION N/A 08/09/2012   Procedure: ANTERIOR CERVICAL DECOMPRESSION/DISCECTOMY FUSION 1 LEVEL;  Surgeon: Floyce Stakes, MD;  Location: [facility];  Service: Neurosurgery;  Laterality: N/A;  Cervical four-five  Anterior cervical decompression/diskectomy/fusion  . ANTERIOR FUSION CERVICAL SPINE  2007   C5 -6  . CARPAL TUNNEL RELEASE  RIGHT - 2001  & DEC 2011 W/ BACK SURG.  . CERVICAL DISC SURGERY  2005   C5 - 6  . CHOLECYSTECTOMY  1994  . DORSAL COMPARTMENT RELEASE Right 04/07/2013   Procedure: RIGHT WRIST STS RELEASE;  Surgeon: Schuyler Amor, MD;  Location: [place];  Service: Orthopedics;  Laterality: Right;  . EXPLORATION OF INCISION FOR CSF LEAK  DEC 2011  X2   POST  LAMINECOTMY  . FINGER ARTHRODESIS Right 04/07/2013   Procedure: RIGHT INDEX AND RIGHT LONG DISTAL INTERPHALANGEAL JOINT FUSIONS;  Surgeon: Schuyler Amor, MD;  Location: [place];  Service: Orthopedics;  Laterality: Right;  . FINGER ARTHROPLASTY Right 04/07/2013   Procedure: RIGHT THUMB [place] ARTHROPLASTY;  Surgeon: Schuyler Amor, MD;  Location: [place];  Service: Orthopedics;  Laterality: Right;  . GANGLION CYST EXCISION Right 04/07/2013   Procedure: RIGHT WRIST MASS EXCISION;  Surgeon: Schuyler Amor, MD;  Location: [place];  Service: Orthopedics;  Laterality: Right;  . KNEE ARTHROSCOPY  LEFT X3 (LAST ONE 2005)  . KNEE ARTHROSCOPY  05/17/2011   Procedure:  ARTHROSCOPY KNEE;  Surgeon: Bradley Ferris III;  Location: [place];  Service: Orthopedics;  Laterality: Right;  WITH MEDIAL MENISECTOMY AND removal of suprapatella fat lump  . LEFT WRIST TENOSYNECTOMY W/ LEFT THUMB JOINT REPAIR  02-24-10  . LUMBAR FUSION  11-30-10   L4 - 5  . LUMBAR FUSION  2000   L2 - 4  . LUMBAR LAMINECTOMY  DEC 2011   L4 - 5  . TENDON REPAIR  JAN 2010   LEFT INDEX AND LONG FINGERS   Past Medical History:  Diagnosis Date  . Acute sinusitis, unspecified   . Anxiety state, unspecified   . Arachnoiditis BILATERAL LEGS   DUE TO MULTIPLE BACK SURG.'S  . Arthritis   . Asthma   . Blood transfusion   . Cardiomyopathy HX --06/2010   EF was 25% during acute illness (PHELONEPHRITIS) Repeat echo 12-06-10 60% showed normal EF.   Marland Kitchen Chronic back pain greater than 3 months duration    S/P BACK SURG'S  . Dyslipidemia   . Dysphagia    some post op cerv fusion 2/14  . Essential hypertension, benign   . GERD (gastroesophageal reflux disease) AND HIATIAL HERNIA   CONTROLLED W/ NEXIUM  . Headache(784.0)   . Heart murmur    DENIES S & S   (ECHO JUN'12 W/ CHART)  . History of chronic bronchitis   . Hx of bladder infections   . Hypertension   . Murmur, heart   . Neuromuscular disorder (HCC)    numbness and tingling  . Osteoporosis   . Other malaise and fatigue   . PONV (postoperative nausea and vomiting)   . Shortness of breath   . Varicosities    venous  . Weakness of both legs DUE TO ARACHNOIDITIS   OCCASIONAL USES CANE   BP 122/87 (BP Location: Left Arm, Patient Position: Sitting, Cuff Size: Large)   Pulse 93   Resp 14   SpO2 96%   Opioid Risk Score:   Fall Risk Score:  `1  Depression screen PHQ 2/9  Depression screen [facility] 2/9 01/20/2016 12/20/2015 07/05/2015 03/08/2015 01/06/2015 09/16/2014  Decreased Interest 0 0 1 1 1 1   Down, Depressed, Hopeless 0 0 1 1 1 1   PHQ - 2 Score 0 0 2 2 2 2   Altered sleeping - - - - - 2  Tired, decreased energy - - -  - - 2  Change in appetite - - - - - 1  Feeling bad or failure about yourself  - - - - - 1  Trouble concentrating - - - - - 2  Moving slowly or fidgety/restless - - - - - 0  Suicidal thoughts - - - - - 0  PHQ-9 Score - - - - - 10  Some recent data  might be hidden    Review of Systems  HENT: Negative.   Eyes: Negative.   Respiratory: Negative.   Cardiovascular: Negative.   Gastrointestinal: Negative.   Endocrine: Negative.   Genitourinary: Negative.   Musculoskeletal: Positive for arthralgias, back pain, myalgias and neck pain.  Skin: Negative.   Allergic/Immunologic: Negative.   Neurological: Negative.   Hematological: Bruises/bleeds easily.  Psychiatric/Behavioral: Negative.   All other systems reviewed and are negative.      Objective:   Physical Exam  Constitutional: She is oriented to person, place, and time. She appears well-developed and well-nourished.  HENT: hoarse voice  Head: Normocephalic and atraumatic.  Eyes: Conjunctivae and EOM are normal. Pupils are equal, round, and reactive to light.  Neck: Normal range of motion. Neck supple.  Cardiovascular: RRR.  Pulmonary/Chest: CTA. Normal effort.  Abdominal: Soft. Bowel sounds are normal. She exhibits no distension.  Musculoskeletal: tenderness with palpation of both greater trochs Cervical back: atrophy of paraspinals. Limited ROM in all planes  Lumbar back: lumbar flexion to about 45 degrees with back and leg pain.  Neurological: She is alert and oriented to person, place, and time. No cranial nerve deficit.  strength grossly 4+/5 in UE's. LE's 4/5 prox to distal. Inconsistent sensory loss in the arms Lower extremities with sensory loss at 1 out of 2, particularly below the knees. . DTR's are tr in either leg. UE DTR's are 2+. Balance is fair. Gait wide based. Psychiatric: She has a normal mood and affect. She is pleasanat Assessment & Plan:  ASSESSMENT:  1. Chronic cervicalgia post laminectomy syndrome with  likely chronic  radiculitis. She has documented C6 radiculitis in the past. I would not be surprised if she has scarring in the cervical region also 2. Post lumbar post laminectomy syndrome.  3. 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 Again, I don't see progression of symptoms/disease on exam.  4. Anxiety/depression secondary to the above     PLAN: ; 1. Increase gabapentin to 600mg  BID and 1200mg  QHS.  2. We discussed the possibility of a spinal stimulator and she is still not to interested in that idea.    3. Will transition back to Oxymorphone ER 40mg  q12 as her pharmacy is able now to acquire the medication. Our staff will help with prior authorization today 4. Will continue oxy IR 15mg  for breakthrough. q6 hour prn #120. We will continue the opioid monitoring program, this consists of regular clinic visits, examinations, urine drug screen, pill counts as well as use of [place] Controlled Substance Reporting System. [place] today.   5. Consider a caudal block to help with symptoms.   6. Discussed the importance of maintaining strength and flexibility of her core musculature 7. Flexeril and valium prn for spasms  8.  The patient will see me or NP in one month. All questions were encouraged and answered. I stressed the importance of lumbar and LE ROM and persistent activity to prevent further tethering and tightening of her low back and cauda equina.

## 2016-10-09 NOTE — Patient Instructions (Signed)
PLEASE FEEL FREE TO CALL OUR OFFICE WITH ANY PROBLEMS OR QUESTIONS ([phone])      

## 2016-10-16 ENCOUNTER — Ambulatory Visit: Payer: Medicare Other | Admitting: Internal Medicine

## 2016-10-17 DIAGNOSIS — E784 Other hyperlipidemia: Secondary | ICD-10-CM | POA: Diagnosis not present

## 2016-10-17 DIAGNOSIS — I10 Essential (primary) hypertension: Secondary | ICD-10-CM | POA: Diagnosis not present

## 2016-10-17 DIAGNOSIS — E1165 Type 2 diabetes mellitus with hyperglycemia: Secondary | ICD-10-CM | POA: Diagnosis not present

## 2016-11-06 ENCOUNTER — Encounter: Payer: Medicare Other | Attending: Physical Medicine & Rehabilitation | Admitting: Physical Medicine & Rehabilitation

## 2016-11-06 ENCOUNTER — Encounter: Payer: Self-pay | Admitting: Physical Medicine & Rehabilitation

## 2016-11-06 VITALS — BP 146/78 | HR 77 | Resp 16

## 2016-11-06 DIAGNOSIS — Z79899 Other long term (current) drug therapy: Secondary | ICD-10-CM | POA: Diagnosis not present

## 2016-11-06 DIAGNOSIS — M5441 Lumbago with sciatica, right side: Secondary | ICD-10-CM | POA: Insufficient documentation

## 2016-11-06 DIAGNOSIS — Z5181 Encounter for therapeutic drug level monitoring: Secondary | ICD-10-CM | POA: Diagnosis not present

## 2016-11-06 DIAGNOSIS — G039 Meningitis, unspecified: Secondary | ICD-10-CM | POA: Diagnosis not present

## 2016-11-06 DIAGNOSIS — M961 Postlaminectomy syndrome, not elsewhere classified: Secondary | ICD-10-CM | POA: Diagnosis not present

## 2016-11-06 MED ORDER — OXYCODONE HCL 15 MG PO TABS: 15.0000 mg | ORAL_TABLET | Freq: Four times a day (QID) | ORAL | 0 refills | Status: DC | PRN

## 2016-11-06 MED ORDER — OXYMORPHONE HCL ER 40 MG PO T12A: 40.0000 mg | EXTENDED_RELEASE_TABLET | Freq: Two times a day (BID) | ORAL | 0 refills | Status: DC

## 2016-11-06 NOTE — Patient Instructions (Signed)
PLEASE FEEL FREE TO CALL OUR OFFICE WITH ANY PROBLEMS OR QUESTIONS ([phone])      

## 2016-11-06 NOTE — Progress Notes (Signed)
Subjective:    Patient ID: [name] [name], female    DOB: 02-12-1951, 66 y.o.   MRN: [id number]  HPI   Cathy is here in follow up of her chronic pain. She has had increased pain in her neck and shoulders the last month or so especially. She's had radiation into her head/temporal areas as well. She feels that it's more dificult to lift her hands over head and perform hygiene and other tasks.   She was able to get the oxymorphone form her pharmacy. This seems to help control her pain better as a whole. The gabapentin increase at night also seems to have helped her sleep and nocturnal symptoms.    Pain Inventory Average Pain 7 Pain Right Now 8 My pain is sharp, burning, stabbing and tingling  In the last 24 hours, has pain interfered with the following? General activity 7 Relation with others 7 Enjoyment of life 7 What TIME of day is your pain at its worst? all Sleep (in general) Fair  Pain is worse with: walking, sitting, standing and some activites Pain improves with: heat/ice and medication Relief from Meds: 7  Mobility walk with assistance use a cane how many minutes can you walk? 10 ability to climb steps?  yes do you drive?  yes  Function disabled: date disabled .  Neuro/Psych No problems in this area  Prior Studies Any changes since last visit?  no  Physicians involved in your care Any changes since last visit?  no   Family History  Problem Relation Age of Onset  . Hypertension Mother   . Heart disease Mother   . Diabetes Mother   . Cancer Father   . Diabetes Brother    Social History   Social History  . Marital status: Married    Spouse name: N/A  . Number of children: N/A  . Years of education: N/A   Occupational History  . disable Disabled   Social History Main Topics  . Smoking status: Never Smoker  . Smokeless tobacco: Never Used  . Alcohol use No  . Drug use: No  . Sexual activity: Not Asked     Comment: second hand smoke   Other  Topics Concern  . None   Social History Narrative   Has one child   Disabled   Past Surgical History:  Procedure Laterality Date  . ABDOMINAL HYSTERECTOMY  1987   W/ BSO  . ANTERIOR CERVICAL DECOMP/DISCECTOMY FUSION N/A 08/09/2012   Procedure: ANTERIOR CERVICAL DECOMPRESSION/DISCECTOMY FUSION 1 LEVEL;  Surgeon: Floyce Stakes, MD;  Location: [facility];  Service: Neurosurgery;  Laterality: N/A;  Cervical four-five  Anterior cervical decompression/diskectomy/fusion  . ANTERIOR FUSION CERVICAL SPINE  2007   C5 -6  . CARPAL TUNNEL RELEASE  RIGHT - 2001  & DEC 2011 W/ BACK SURG.  . CERVICAL DISC SURGERY  2005   C5 - 6  . CHOLECYSTECTOMY  1994  . DORSAL COMPARTMENT RELEASE Right 04/07/2013   Procedure: RIGHT WRIST STS RELEASE;  Surgeon: Schuyler Amor, MD;  Location: [place];  Service: Orthopedics;  Laterality: Right;  . EXPLORATION OF INCISION FOR CSF LEAK  DEC 2011  X2   POST LAMINECOTMY  . FINGER ARTHRODESIS Right 04/07/2013   Procedure: RIGHT INDEX AND RIGHT LONG DISTAL INTERPHALANGEAL JOINT FUSIONS;  Surgeon: Schuyler Amor, MD;  Location: [place];  Service: Orthopedics;  Laterality: Right;  . FINGER ARTHROPLASTY Right 04/07/2013   Procedure: RIGHT THUMB CMC ARTHROPLASTY;  Surgeon: Schuyler Amor, MD;  Location: [place];  Service: Orthopedics;  Laterality: Right;  . GANGLION CYST EXCISION Right 04/07/2013   Procedure: RIGHT WRIST MASS EXCISION;  Surgeon: Schuyler Amor, MD;  Location: [place];  Service: Orthopedics;  Laterality: Right;  . KNEE ARTHROSCOPY  LEFT X3 (LAST ONE 2005)  . KNEE ARTHROSCOPY  05/17/2011   Procedure: ARTHROSCOPY KNEE;  Surgeon: Bradley Ferris III;  Location: [place];  Service: Orthopedics;  Laterality: Right;  WITH MEDIAL MENISECTOMY AND removal of suprapatella fat lump  . LEFT WRIST TENOSYNECTOMY W/ LEFT THUMB JOINT REPAIR  02-24-10  . LUMBAR FUSION   11-30-10   L4 - 5  . LUMBAR FUSION  2000   L2 - 4  . LUMBAR LAMINECTOMY  DEC 2011   L4 - 5  . TENDON REPAIR  JAN 2010   LEFT INDEX AND LONG FINGERS   Past Medical History:  Diagnosis Date  . Acute sinusitis, unspecified   . Anxiety state, unspecified   . Arachnoiditis BILATERAL LEGS   DUE TO MULTIPLE BACK SURG.'S  . Arthritis   . Asthma   . Blood transfusion   . Cardiomyopathy HX --06/2010   EF was 25% during acute illness (PHELONEPHRITIS) Repeat echo 12-06-10 60% showed normal EF.   Marland Kitchen Chronic back pain greater than 3 months duration    S/P BACK SURG'S  . Dyslipidemia   . Dysphagia    some post op cerv fusion 2/14  . Essential hypertension, benign   . GERD (gastroesophageal reflux disease) AND HIATIAL HERNIA   CONTROLLED W/ NEXIUM  . Headache(784.0)   . Heart murmur    DENIES S & S   (ECHO JUN'12 W/ CHART)  . History of chronic bronchitis   . Hx of bladder infections   . Hypertension   . Murmur, heart   . Neuromuscular disorder (HCC)    numbness and tingling  . Osteoporosis   . Other malaise and fatigue   . PONV (postoperative nausea and vomiting)   . Shortness of breath   . Varicosities    venous  . Weakness of both legs DUE TO ARACHNOIDITIS   OCCASIONAL USES CANE   BP (!) 146/78 (BP Location: Right Arm, Patient Position: Sitting, Cuff Size: Normal)   Pulse 77   Resp 16   SpO2 98%   Opioid Risk Score:   Fall Risk Score:  `1  Depression screen PHQ 2/9  Depression screen [facility] 2/9 01/20/2016 12/20/2015 07/05/2015 03/08/2015 01/06/2015 09/16/2014  Decreased Interest 0 0 1 1 1 1   Down, Depressed, Hopeless 0 0 1 1 1 1   PHQ - 2 Score 0 0 2 2 2 2   Altered sleeping - - - - - 2  Tired, decreased energy - - - - - 2  Change in appetite - - - - - 1  Feeling bad or failure about yourself  - - - - - 1  Trouble concentrating - - - - - 2  Moving slowly or fidgety/restless - - - - - 0  Suicidal thoughts - - - - - 0  PHQ-9 Score - - - - - 10  Some recent data might be hidden      Review of Systems  Constitutional: Negative.   HENT: Negative.   Eyes: Negative.   Respiratory: Negative.   Cardiovascular: Negative.   Gastrointestinal: Negative.   Endocrine: Negative.   Genitourinary: Negative.   Musculoskeletal: Positive for arthralgias, back pain, myalgias  and neck pain.  Skin: Negative.   Allergic/Immunologic: Negative.   Neurological: Negative.   Hematological: Negative.   Psychiatric/Behavioral: Negative.   All other systems reviewed and are negative.      Objective:   Physical Exam  Constitutional: She is oriented to person, place, and time. She appears well-developed and well-nourished.  HENT: hoarse voice  Head: Normocephalic and atraumatic.  Eyes: Conjunctivae and EOM are normal. Pupils are equal, round, and reactive to light.  Neck: Normal range of motion. Neck supple.  Cardiovascular: RRR.  Pulmonary/Chest: CTA B Abdominal: Soft. Bowel sounds are normal. She exhibits no distension.  Musculoskeletal: tenderness with palpation of both greater trochs Cervical back: atrophy of paraspinals. Limited ROM in all planes Lumbar back: lumbar flexion to about 45 degrees with back and leg pain.  Neurological: She is alert and oriented to person, place, and time. No cranial nerve deficit. p strength grossly 4+/5 in UE's with pain giveaway weakness at the shoulders. LE's 4/5 rox to distal. Inconsistent sensory loss in the arms once again. Lower extremities with sensory loss at 1 out of 2, particularly below the knees. DTR's are tr in either leg. UE DTR's are 2+.  Balance is fair. Gait wide based. Psychiatric: She has a normal mood and affect. She is pleasanat Assessment & Plan:  ASSESSMENT:  1. 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 She has documented C6 radiculitis in the past. She may in fact have scarring in the cervical region also 2. Post lumbar post laminectomy syndrome.  3. Lumbar arachnoiditis with  chronic lower extremity neuropathic pain which is causing most of her lower ext pain.---substantial arachnoiditis confirmed with recent CT myelogram.  4. Anxiety/depression secondary to the above     PLAN:  1. Maintain gabapentin at 600mg  BID and 1200mg  QHS which has helped he symptoms at night.  2. We discussed the possibility of a spinal stimulator and she remains hesitant in that regard. She will d/w Dr. Vertell Limber when she sees him.   3. Continue Oxymorphone ER 40mg  q12 as her pharmacy is able to acquire the medication.   4. Will continue oxy IR 15mg  for breakthrough. q6 hour prn #120. We will continue the opioid monitoring program, this consists of regular clinic visits, examinations, urine drug screen, pill counts as well as use of [place] Controlled Substance Reporting System. [place] was [place] today.  5. Consider acaudal block to help with symptoms.   6. Discussed the importance of maintaining strength and flexibility of her core musculature 7. Flexeril and valium prn for spasms. Limit valium as possible.  8. The patient will see  NP in one month. All questions were encouraged and answered. I stressed the importance of lumbar and LE ROM and persistent activity once again. She does do a pretty good job of staying active. Marland Kitchen

## 2016-11-08 ENCOUNTER — Ambulatory Visit: Payer: Medicare Other | Admitting: Physical Medicine & Rehabilitation

## 2016-12-05 ENCOUNTER — Encounter: Payer: Medicare Other | Attending: Physical Medicine & Rehabilitation | Admitting: Registered Nurse

## 2016-12-05 ENCOUNTER — Encounter: Payer: Self-pay | Admitting: Registered Nurse

## 2016-12-05 VITALS — BP 119/79 | HR 81

## 2016-12-05 DIAGNOSIS — G894 Chronic pain syndrome: Secondary | ICD-10-CM

## 2016-12-05 DIAGNOSIS — G039 Meningitis, unspecified: Secondary | ICD-10-CM | POA: Diagnosis not present

## 2016-12-05 DIAGNOSIS — M5441 Lumbago with sciatica, right side: Secondary | ICD-10-CM | POA: Diagnosis not present

## 2016-12-05 DIAGNOSIS — M961 Postlaminectomy syndrome, not elsewhere classified: Secondary | ICD-10-CM | POA: Diagnosis not present

## 2016-12-05 DIAGNOSIS — M5416 Radiculopathy, lumbar region: Secondary | ICD-10-CM

## 2016-12-05 DIAGNOSIS — M5412 Radiculopathy, cervical region: Secondary | ICD-10-CM | POA: Diagnosis not present

## 2016-12-05 DIAGNOSIS — Z79899 Other long term (current) drug therapy: Secondary | ICD-10-CM

## 2016-12-05 DIAGNOSIS — Z5181 Encounter for therapeutic drug level monitoring: Secondary | ICD-10-CM | POA: Diagnosis not present

## 2016-12-05 MED ORDER — OXYCODONE HCL 15 MG PO TABS: 15.0000 mg | ORAL_TABLET | Freq: Four times a day (QID) | ORAL | 0 refills | Status: DC | PRN

## 2016-12-05 MED ORDER — OXYMORPHONE HCL ER 40 MG PO T12A: 40.0000 mg | EXTENDED_RELEASE_TABLET | Freq: Two times a day (BID) | ORAL | 0 refills | Status: DC

## 2016-12-05 NOTE — Progress Notes (Signed)
Subjective:    Patient ID: [name] [name], female    DOB: 1950-11-02, 66 y.o.   MRN: [id number]  HPI: [name] [name] is a 66year old female who returns for follow up appointmentfor chronic pain and medication refill. She states her pain is located in her neck radiating into her right arm and occasionally left arm the pain stops at her elbows and lower back radiating into her bilaterallower extremities posteriorly.She rates her pain 7. Her current exercise regime is walking, pool therapy and performing stretching exercises.   She has an appointment schedule with Dr. Vertell Limber 12/06/2016. Last UDS was performed on 09/11/2016, it was consistent. UDS ordered for today.   Pain Inventory Average Pain 7 Pain Right Now 7 My pain is constant, sharp, burning, stabbing, tingling and aching  In the last 24 hours, has pain interfered with the following? General activity 7 Relation with others 7 Enjoyment of life 7 What TIME of day is your pain at its worst? all Sleep (in general) Fair  Pain is worse with: walking, sitting, standing and some activites Pain improves with: rest, heat/ice and medication Relief from Meds: 7  Mobility Do you have any goals in this area?  no  Function Do you have any goals in this area?  no  Neuro/Psych No problems in this area  Prior Studies Any changes since last visit?  no  Physicians involved in your care Any changes since last visit?  no   Family History  Problem Relation Age of Onset  . Hypertension Mother   . Heart disease Mother   . Diabetes Mother   . Cancer Father   . Diabetes Brother    Social History   Social History  . Marital status: Married    Spouse name: N/A  . Number of children: N/A  . Years of education: N/A   Occupational History  . disable Disabled   Social History Main Topics  . Smoking status: Never Smoker  . Smokeless tobacco: Never Used  . Alcohol use No  . Drug use: No  . Sexual activity: Not Asked   Comment: second hand smoke   Other Topics Concern  . None   Social History Narrative   Has one child   Disabled   Past Surgical History:  Procedure Laterality Date  . ABDOMINAL HYSTERECTOMY  1987   W/ BSO  . ANTERIOR CERVICAL DECOMP/DISCECTOMY FUSION N/A 08/09/2012   Procedure: ANTERIOR CERVICAL DECOMPRESSION/DISCECTOMY FUSION 1 LEVEL;  Surgeon: Floyce Stakes, MD;  Location: [facility];  Service: Neurosurgery;  Laterality: N/A;  Cervical four-five  Anterior cervical decompression/diskectomy/fusion  . ANTERIOR FUSION CERVICAL SPINE  2007   C5 -6  . CARPAL TUNNEL RELEASE  RIGHT - 2001  & DEC 2011 W/ BACK SURG.  . CERVICAL DISC SURGERY  2005   C5 - 6  . CHOLECYSTECTOMY  1994  . DORSAL COMPARTMENT RELEASE Right 04/07/2013   Procedure: RIGHT WRIST STS RELEASE;  Surgeon: Schuyler Amor, MD;  Location: [place];  Service: Orthopedics;  Laterality: Right;  . EXPLORATION OF INCISION FOR CSF LEAK  DEC 2011  X2   POST LAMINECOTMY  . FINGER ARTHRODESIS Right 04/07/2013   Procedure: RIGHT INDEX AND RIGHT LONG DISTAL INTERPHALANGEAL JOINT FUSIONS;  Surgeon: Schuyler Amor, MD;  Location: [place];  Service: Orthopedics;  Laterality: Right;  . FINGER ARTHROPLASTY Right 04/07/2013   Procedure: RIGHT THUMB [place] ARTHROPLASTY;  Surgeon: Schuyler Amor, MD;  Location: [facility];  Service: Orthopedics;  Laterality: Right;  . GANGLION CYST EXCISION Right 04/07/2013   Procedure: RIGHT WRIST MASS EXCISION;  Surgeon: Schuyler Amor, MD;  Location: [place];  Service: Orthopedics;  Laterality: Right;  . KNEE ARTHROSCOPY  LEFT X3 (LAST ONE 2005)  . KNEE ARTHROSCOPY  05/17/2011   Procedure: ARTHROSCOPY KNEE;  Surgeon: Bradley Ferris III;  Location: [facility];  Service: Orthopedics;  Laterality: Right;  WITH MEDIAL MENISECTOMY AND removal of suprapatella fat lump  . LEFT WRIST TENOSYNECTOMY W/ LEFT THUMB JOINT  REPAIR  02-24-10  . LUMBAR FUSION  11-30-10   L4 - 5  . LUMBAR FUSION  2000   L2 - 4  . LUMBAR LAMINECTOMY  DEC 2011   L4 - 5  . TENDON REPAIR  JAN 2010   LEFT INDEX AND LONG FINGERS   Past Medical History:  Diagnosis Date  . Acute sinusitis, unspecified   . Anxiety state, unspecified   . Arachnoiditis BILATERAL LEGS   DUE TO MULTIPLE BACK SURG.'S  . Arthritis   . Asthma   . Blood transfusion   . Cardiomyopathy HX --06/2010   EF was 25% during acute illness (PHELONEPHRITIS) Repeat echo 12-06-10 60% showed normal EF.   Marland Kitchen Chronic back pain greater than 3 months duration    S/P BACK SURG'S  . Dyslipidemia   . Dysphagia    some post op cerv fusion 2/14  . Essential hypertension, benign   . GERD (gastroesophageal reflux disease) AND HIATIAL HERNIA   CONTROLLED W/ NEXIUM  . Headache(784.0)   . Heart murmur    DENIES S & S   (ECHO JUN'12 W/ CHART)  . History of chronic bronchitis   . Hx of bladder infections   . Hypertension   . Murmur, heart   . Neuromuscular disorder (HCC)    numbness and tingling  . Osteoporosis   . Other malaise and fatigue   . PONV (postoperative nausea and vomiting)   . Shortness of breath   . Varicosities    venous  . Weakness of both legs DUE TO ARACHNOIDITIS   OCCASIONAL USES CANE   BP 119/79 (BP Location: Left Arm, Patient Position: Sitting)   Pulse 81   SpO2 96%   Opioid Risk Score:  2 Fall Risk Score:  `1  Depression screen PHQ 2/9  Depression screen [facility] 2/9 01/20/2016 12/20/2015 07/05/2015 03/08/2015 01/06/2015 09/16/2014  Decreased Interest 0 0 1 1 1 1   Down, Depressed, Hopeless 0 0 1 1 1 1   PHQ - 2 Score 0 0 2 2 2 2   Altered sleeping - - - - - 2  Tired, decreased energy - - - - - 2  Change in appetite - - - - - 1  Feeling bad or failure about yourself  - - - - - 1  Trouble concentrating - - - - - 2  Moving slowly or fidgety/restless - - - - - 0  Suicidal thoughts - - - - - 0  PHQ-9 Score - - - - - 10  Some recent data might be  hidden    Review of Systems  Constitutional: Negative.   HENT: Negative.   Eyes: Negative.   Respiratory: Negative.   Cardiovascular: Negative.   Gastrointestinal: Negative.   Endocrine: Negative.   Genitourinary: Negative.   Musculoskeletal: Negative.   Skin: Negative.   Allergic/Immunologic: Negative.   Neurological: Negative.   Hematological: Negative.   Psychiatric/Behavioral: Negative.   All  other systems reviewed and are negative.      Objective:   Physical Exam  Constitutional: She is oriented to person, place, and time. She appears well-developed and well-nourished.  HENT:  Head: Normocephalic and atraumatic.  Neck: Normal range of motion. Neck supple.  Cervical Paraspinal Tenderness: C-5-C-6  Cardiovascular: Normal rate and regular rhythm.   Pulmonary/Chest: Effort normal and breath sounds normal.  Musculoskeletal:  Normal Muscle Bulk and Muscle Testing Reveals: Upper Extremities: Full ROM and Muscle Strength 5/5 Bilateral AC Joint Tenderness Thoracic Hypersensitivity Lumbar Paraspinal Tenderness: L-3-L-5 Lower Extremities: Full ROM and Muscle Strength 5/5 Bilateral Lower Extremities Flexion Produces Pain into Bilateral Lower Extremities: Arises from Table with ease Narrow Based Gait  Neurological: She is alert and oriented to person, place, and time.  Skin: Skin is warm and dry.  Psychiatric: She has a normal mood and affect.  Nursing note and vitals reviewed.  Assessment and Plan: 1. 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 12/05/2016 Refilled:Oxycodone 15mg  one tablet every 6 hours as needed #120 and  Oxymorphone HCL 40 mg every 12 hours  #60. We will continue the opioid monitoring program, this consists of regular clinic visits, examinations, urine drug screen, pill counts as well as use of [place] Controlled Substance Reporting System. 2. 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  Continue Gabapentin 600mg  QID. Encourage Posture and Stretching Exercise. 12/05/2016 3. Anxiety/depression: Continue Valium and monitor. 12/05/2016 4. Muscle Spasms: Continue to Monitor. 12/05/2016 5. Neck Pain/ Radiculopathy: Dr. Joya Salm has retired, Ms Leming has an appointment with Dr. Vertell Limber in the morning.  12/05/2016  20 minutes of face to face patient care time was spent during this visit. All questions were encouraged and answered.    F/U in 1 month

## 2016-12-06 DIAGNOSIS — M545 Low back pain: Secondary | ICD-10-CM | POA: Diagnosis not present

## 2016-12-06 DIAGNOSIS — M5412 Radiculopathy, cervical region: Secondary | ICD-10-CM | POA: Diagnosis not present

## 2016-12-06 DIAGNOSIS — M5416 Radiculopathy, lumbar region: Secondary | ICD-10-CM | POA: Diagnosis not present

## 2016-12-06 DIAGNOSIS — G039 Meningitis, unspecified: Secondary | ICD-10-CM | POA: Diagnosis not present

## 2016-12-06 DIAGNOSIS — M542 Cervicalgia: Secondary | ICD-10-CM | POA: Diagnosis not present

## 2016-12-08 LAB — TOXASSURE SELECT,+ANTIDEPR,UR

## 2016-12-11 ENCOUNTER — Telehealth: Payer: Self-pay | Admitting: *Deleted

## 2016-12-11 NOTE — Telephone Encounter (Signed)
Urine drug screen for this encounter is consistent for prescribed medication 

## 2016-12-26 ENCOUNTER — Ambulatory Visit: Payer: Medicare Other | Admitting: Internal Medicine

## 2017-01-03 DIAGNOSIS — I10 Essential (primary) hypertension: Secondary | ICD-10-CM | POA: Diagnosis not present

## 2017-01-03 DIAGNOSIS — M4802 Spinal stenosis, cervical region: Secondary | ICD-10-CM | POA: Diagnosis not present

## 2017-01-03 DIAGNOSIS — M542 Cervicalgia: Secondary | ICD-10-CM | POA: Diagnosis not present

## 2017-01-03 DIAGNOSIS — M5412 Radiculopathy, cervical region: Secondary | ICD-10-CM | POA: Diagnosis not present

## 2017-01-03 DIAGNOSIS — Z6832 Body mass index (BMI) 32.0-32.9, adult: Secondary | ICD-10-CM | POA: Diagnosis not present

## 2017-01-03 DIAGNOSIS — G959 Disease of spinal cord, unspecified: Secondary | ICD-10-CM | POA: Diagnosis not present

## 2017-01-05 ENCOUNTER — Encounter: Payer: Medicare Other | Attending: Physical Medicine & Rehabilitation | Admitting: Registered Nurse

## 2017-01-05 ENCOUNTER — Encounter: Payer: Self-pay | Admitting: Registered Nurse

## 2017-01-05 VITALS — BP 136/78 | HR 97

## 2017-01-05 DIAGNOSIS — M5416 Radiculopathy, lumbar region: Secondary | ICD-10-CM | POA: Diagnosis not present

## 2017-01-05 DIAGNOSIS — M5412 Radiculopathy, cervical region: Secondary | ICD-10-CM

## 2017-01-05 DIAGNOSIS — G894 Chronic pain syndrome: Secondary | ICD-10-CM

## 2017-01-05 DIAGNOSIS — Z79899 Other long term (current) drug therapy: Secondary | ICD-10-CM | POA: Diagnosis not present

## 2017-01-05 DIAGNOSIS — M5441 Lumbago with sciatica, right side: Secondary | ICD-10-CM | POA: Diagnosis not present

## 2017-01-05 DIAGNOSIS — G039 Meningitis, unspecified: Secondary | ICD-10-CM | POA: Diagnosis not present

## 2017-01-05 DIAGNOSIS — Z5181 Encounter for therapeutic drug level monitoring: Secondary | ICD-10-CM | POA: Diagnosis not present

## 2017-01-05 DIAGNOSIS — M961 Postlaminectomy syndrome, not elsewhere classified: Secondary | ICD-10-CM | POA: Insufficient documentation

## 2017-01-05 MED ORDER — OXYCODONE HCL 15 MG PO TABS: 15.0000 mg | ORAL_TABLET | Freq: Four times a day (QID) | ORAL | 0 refills | Status: DC | PRN

## 2017-01-05 MED ORDER — OXYMORPHONE HCL ER 40 MG PO T12A: 40.0000 mg | EXTENDED_RELEASE_TABLET | Freq: Two times a day (BID) | ORAL | 0 refills | Status: DC

## 2017-01-05 NOTE — Progress Notes (Signed)
Subjective:    Patient ID: [name] [name], female    DOB: 1950-10-16, 66 y.o.   MRN: [id number]  HPI: ChristineChristine [name] is a 66year old female who returns for follow up appointmentfor chronic pain and medication refill. She states her pain is located in her neck radiating into her bilateral shoulders L>R and lower back radiating lower extremities.She rates her pain 8. Her current exercise regime is walking, pool therapy and performing stretching exercises.   [name] [name] admits she is under emotional stress with her husband, admits to depression. She is acceptable to counseling refers a female. [facility] Telephone number given, she denies any suicidal thoughts.  Also states she seen  Dr. Vertell Limber  On 12/06/2016, had a MRI on  01/03/2017, she states she will be scheduled for surgery awaiting on date.   Last UDS was performed on 12/05/2016, it was consistent.   Pain Inventory Average Pain 8 Pain Right Now 8 My pain is sharp, burning, stabbing, tingling and aching  In the last 24 hours, has pain interfered with the following? General activity 8 Relation with others 8 Enjoyment of life 8 What TIME of day is your pain at its worst? all Sleep (in general) Fair  Pain is worse with: walking, bending, sitting, standing and some activites Pain improves with: rest and heat/ice Relief from Meds: 7  Mobility Do you have any goals in this area?  no  Function Do you have any goals in this area?  no  Neuro/Psych No problems in this area  Prior Studies Any changes since last visit?  yes MRI and is having neck surgery probably in September  Physicians involved in your care Neurosurgeon Vertell Limber   Family History  Problem Relation Age of Onset  . Hypertension Mother   . Heart disease Mother   . Diabetes Mother   . Cancer Father   . Diabetes Brother    Social History   Social History  . Marital status: Married    Spouse name: N/A  . Number of children: N/A  . Years of  education: N/A   Occupational History  . disable Disabled   Social History Main Topics  . Smoking status: Never Smoker  . Smokeless tobacco: Never Used  . Alcohol use No  . Drug use: No  . Sexual activity: Not Asked     Comment: second hand smoke   Other Topics Concern  . None   Social History Narrative   Has one child   Disabled   Past Surgical History:  Procedure Laterality Date  . ABDOMINAL HYSTERECTOMY  1987   W/ BSO  . ANTERIOR CERVICAL DECOMP/DISCECTOMY FUSION N/A 08/09/2012   Procedure: ANTERIOR CERVICAL DECOMPRESSION/DISCECTOMY FUSION 1 LEVEL;  Surgeon: Floyce Stakes, MD;  Location: [facility];  Service: Neurosurgery;  Laterality: N/A;  Cervical four-five  Anterior cervical decompression/diskectomy/fusion  . ANTERIOR FUSION CERVICAL SPINE  2007   C5 -6  . CARPAL TUNNEL RELEASE  RIGHT - 2001  & DEC 2011 W/ BACK SURG.  . CERVICAL DISC SURGERY  2005   C5 - 6  . CHOLECYSTECTOMY  1994  . DORSAL COMPARTMENT RELEASE Right 04/07/2013   Procedure: RIGHT WRIST STS RELEASE;  Surgeon: Schuyler Amor, MD;  Location: [place];  Service: Orthopedics;  Laterality: Right;  . EXPLORATION OF INCISION FOR CSF LEAK  DEC 2011  X2   POST LAMINECOTMY  . FINGER ARTHRODESIS Right 04/07/2013   Procedure: RIGHT INDEX AND RIGHT LONG DISTAL  INTERPHALANGEAL JOINT FUSIONS;  Surgeon: Schuyler Amor, MD;  Location: [place];  Service: Orthopedics;  Laterality: Right;  . FINGER ARTHROPLASTY Right 04/07/2013   Procedure: RIGHT THUMB [place] ARTHROPLASTY;  Surgeon: Schuyler Amor, MD;  Location: [place];  Service: Orthopedics;  Laterality: Right;  . GANGLION CYST EXCISION Right 04/07/2013   Procedure: RIGHT WRIST MASS EXCISION;  Surgeon: Schuyler Amor, MD;  Location: [place];  Service: Orthopedics;  Laterality: Right;  . KNEE ARTHROSCOPY  LEFT X3 (LAST ONE 2005)  . KNEE ARTHROSCOPY  05/17/2011   Procedure:  ARTHROSCOPY KNEE;  Surgeon: Bradley Ferris III;  Location: [place];  Service: Orthopedics;  Laterality: Right;  WITH MEDIAL MENISECTOMY AND removal of suprapatella fat lump  . LEFT WRIST TENOSYNECTOMY W/ LEFT THUMB JOINT REPAIR  02-24-10  . LUMBAR FUSION  11-30-10   L4 - 5  . LUMBAR FUSION  2000   L2 - 4  . LUMBAR LAMINECTOMY  DEC 2011   L4 - 5  . TENDON REPAIR  JAN 2010   LEFT INDEX AND LONG FINGERS   Past Medical History:  Diagnosis Date  . Acute sinusitis, unspecified   . Anxiety state, unspecified   . Arachnoiditis BILATERAL LEGS   DUE TO MULTIPLE BACK SURG.'S  . Arthritis   . Asthma   . Blood transfusion   . Cardiomyopathy HX --06/2010   EF was 25% during acute illness (PHELONEPHRITIS) Repeat echo 12-06-10 60% showed normal EF.   Marland Kitchen Chronic back pain greater than 3 months duration    S/P BACK SURG'S  . Dyslipidemia   . Dysphagia    some post op cerv fusion 2/14  . Essential hypertension, benign   . GERD (gastroesophageal reflux disease) AND HIATIAL HERNIA   CONTROLLED W/ NEXIUM  . Headache(784.0)   . Heart murmur    DENIES S & S   (ECHO JUN'12 W/ CHART)  . History of chronic bronchitis   . Hx of bladder infections   . Hypertension   . Murmur, heart   . Neuromuscular disorder (HCC)    numbness and tingling  . Osteoporosis   . Other malaise and fatigue   . PONV (postoperative nausea and vomiting)   . Shortness of breath   . Varicosities    venous  . Weakness of both legs DUE TO ARACHNOIDITIS   OCCASIONAL USES CANE   BP 136/78   Pulse 97   SpO2 98%   Opioid Risk Score:   Fall Risk Score:  `1  Depression screen PHQ 2/9  Depression screen [facility] 2/9 01/05/2017 01/20/2016 12/20/2015 07/05/2015 03/08/2015 01/06/2015 09/16/2014  Decreased Interest 0 0 0 1 1 1 1   Down, Depressed, Hopeless 0 0 0 1 1 1 1   PHQ - 2 Score 0 0 0 2 2 2 2   Altered sleeping - - - - - - 2  Tired, decreased energy - - - - - - 2  Change in appetite - - - - - - 1  Feeling bad or  failure about yourself  - - - - - - 1  Trouble concentrating - - - - - - 2  Moving slowly or fidgety/restless - - - - - - 0  Suicidal thoughts - - - - - - 0  PHQ-9 Score - - - - - - 10  Some recent data might be hidden    Review of Systems  Constitutional: Negative.   HENT: Positive for trouble  swallowing.   Eyes: Negative.   Respiratory: Negative.   Cardiovascular: Negative.   Gastrointestinal: Negative.   Endocrine: Negative.   Genitourinary: Negative.   Musculoskeletal: Positive for neck pain.  Skin: Negative.   Allergic/Immunologic: Negative.   Neurological: Negative.   Hematological: Negative.   Psychiatric/Behavioral: The patient is nervous/anxious.   All other systems reviewed and are negative.      Objective:   Physical Exam  Constitutional: She is oriented to person, place, and time. She appears well-developed and well-nourished.  HENT:  Head: Normocephalic and atraumatic.  Neck: Normal range of motion. Neck supple.  Cardiovascular: Normal rate and regular rhythm.   Pulmonary/Chest: Effort normal and breath sounds normal.  Musculoskeletal:  Normal Muscle Bulk and  Muscle Testing Reveals: Upper Extremities: Full ROM and Muscle Strength 5/5 Thoracic and Lumbar Hypersensitivity Lower Extremities: Full ROM and Muscle Strength 5/5 Arises from Table Slowly using straight cane for support Narrow Based gait  Neurological: She is alert and oriented to person, place, and time.  Skin: Skin is warm and dry.  Psychiatric: She has a normal mood and affect.  Nursing note and vitals reviewed.         Assessment & Plan:  1. 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 01/05/2017 Refilled:Oxycodone 15mg  one tablet every 6 hours as needed #120 and  Oxymorphone HCL 40 mg every 12 hours  #60. We will continue the opioid monitoring program, this consists of regular clinic visits, examinations, urine drug screen, pill counts as well as use  of [place] Controlled Substance Reporting System. 2. 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 Continue Gabapentin 600mg  QID. Encourage Posture and Stretching Exercise. 01/05/2017 3. Anxiety/depression: Continue Valium and monitor. 01/05/2017 4. Muscle Spasms: Continue to Monitor. 01/05/2017 5. Neck Pain/ Radiculopathy: Continue Gabapentin:  Dr. Vertell Limber Following:   01/05/2017  20  minutes of face to face patient care time was spent during this visit. All questions were encouraged and answered.    F/U in 1 month

## 2017-01-09 ENCOUNTER — Telehealth: Payer: Self-pay | Admitting: Registered Nurse

## 2017-01-09 NOTE — Telephone Encounter (Signed)
On 01/09/2017 the [place] was reviewed no conflict was seen on the [place] with multiple prescribers. Ms. Hines has a signed narcotic contract with our office. If there were any discrepancies this would have been reported to her physician.

## 2017-01-23 DIAGNOSIS — E784 Other hyperlipidemia: Secondary | ICD-10-CM | POA: Diagnosis not present

## 2017-01-23 DIAGNOSIS — E1165 Type 2 diabetes mellitus with hyperglycemia: Secondary | ICD-10-CM | POA: Diagnosis not present

## 2017-01-23 DIAGNOSIS — I10 Essential (primary) hypertension: Secondary | ICD-10-CM | POA: Diagnosis not present

## 2017-01-30 DIAGNOSIS — H938X1 Other specified disorders of right ear: Secondary | ICD-10-CM | POA: Diagnosis not present

## 2017-01-30 DIAGNOSIS — J343 Hypertrophy of nasal turbinates: Secondary | ICD-10-CM | POA: Diagnosis not present

## 2017-01-30 DIAGNOSIS — K219 Gastro-esophageal reflux disease without esophagitis: Secondary | ICD-10-CM | POA: Diagnosis not present

## 2017-02-02 ENCOUNTER — Other Ambulatory Visit: Payer: Self-pay | Admitting: Neurosurgery

## 2017-02-06 ENCOUNTER — Encounter: Payer: Medicare Other | Attending: Physical Medicine & Rehabilitation | Admitting: Registered Nurse

## 2017-02-06 ENCOUNTER — Encounter: Payer: Self-pay | Admitting: Registered Nurse

## 2017-02-06 VITALS — BP 107/68 | HR 78

## 2017-02-06 DIAGNOSIS — M5412 Radiculopathy, cervical region: Secondary | ICD-10-CM | POA: Diagnosis not present

## 2017-02-06 DIAGNOSIS — Z79899 Other long term (current) drug therapy: Secondary | ICD-10-CM

## 2017-02-06 DIAGNOSIS — Z5181 Encounter for therapeutic drug level monitoring: Secondary | ICD-10-CM

## 2017-02-06 DIAGNOSIS — M5441 Lumbago with sciatica, right side: Secondary | ICD-10-CM | POA: Insufficient documentation

## 2017-02-06 DIAGNOSIS — G039 Meningitis, unspecified: Secondary | ICD-10-CM | POA: Diagnosis not present

## 2017-02-06 DIAGNOSIS — G894 Chronic pain syndrome: Secondary | ICD-10-CM | POA: Diagnosis not present

## 2017-02-06 DIAGNOSIS — M961 Postlaminectomy syndrome, not elsewhere classified: Secondary | ICD-10-CM | POA: Diagnosis not present

## 2017-02-06 DIAGNOSIS — M5416 Radiculopathy, lumbar region: Secondary | ICD-10-CM | POA: Diagnosis not present

## 2017-02-06 MED ORDER — OXYCODONE HCL 15 MG PO TABS: 15.0000 mg | ORAL_TABLET | Freq: Four times a day (QID) | ORAL | 0 refills | Status: DC | PRN

## 2017-02-06 MED ORDER — OXYMORPHONE HCL ER 40 MG PO T12A: 40.0000 mg | EXTENDED_RELEASE_TABLET | Freq: Two times a day (BID) | ORAL | 0 refills | Status: DC

## 2017-02-06 NOTE — Progress Notes (Signed)
Subjective:    Patient ID: [name] [name], female    DOB: 29-May-1951, 66 y.o.   MRN: [id number]  HPI:  [name] [name] is a 66year old female who returns for follow up appointmentfor chronic pain and medication refill. She states her pain is located in her neck radiating into her bilateral shoulders L>R and mid- lower back radiating into left hip and left lower extremity.She rates her pain 9. Her current exercise regime is walking, pool therapyand performing stretching exercises.   [name] [name] is scheduled for surgery on 04/03/2017: Dr. Vertell Limber will be forming C5C6C7 T1 Posterior Cervical Fusion with lateral mass fixation with AIRO Imaging, revision of prior instrumentation.   Last UDS was performed on 12/05/2016, it was consistent.   Pain Inventory Average Pain 7 Pain Right Now 9 My pain is sharp, burning, stabbing and tingling  In the last 24 hours, has pain interfered with the following? General activity 6 Relation with others 6 Enjoyment of life 7 What TIME of day is your pain at its worst? all Sleep (in general) Fair  Pain is worse with: walking, sitting, standing and some activites Pain improves with: heat/ice and medication Relief from Meds: 7  Mobility walk without assistance ability to climb steps?  yes do you drive?  yes  Function Do you have any goals in this area?  no  Neuro/Psych No problems in this area  Prior Studies Any changes since last visit?  no  Physicians involved in your care Any changes since last visit?  no   Family History  Problem Relation Age of Onset  . Hypertension Mother   . Heart disease Mother   . Diabetes Mother   . Cancer Father   . Diabetes Brother    Social History   Social History  . Marital status: Married    Spouse name: N/A  . Number of children: N/A  . Years of education: N/A   Occupational History  . disable Disabled   Social History Main Topics  . Smoking status: Never Smoker  . Smokeless tobacco:  Never Used  . Alcohol use No  . Drug use: No  . Sexual activity: Not on file     Comment: second hand smoke   Other Topics Concern  . Not on file   Social History Narrative   Has one child   Disabled   Past Surgical History:  Procedure Laterality Date  . ABDOMINAL HYSTERECTOMY  1987   W/ BSO  . ANTERIOR CERVICAL DECOMP/DISCECTOMY FUSION N/A 08/09/2012   Procedure: ANTERIOR CERVICAL DECOMPRESSION/DISCECTOMY FUSION 1 LEVEL;  Surgeon: Floyce Stakes, MD;  Location: [facility];  Service: Neurosurgery;  Laterality: N/A;  Cervical four-five  Anterior cervical decompression/diskectomy/fusion  . ANTERIOR FUSION CERVICAL SPINE  2007   C5 -6  . CARPAL TUNNEL RELEASE  RIGHT - 2001  & DEC 2011 W/ BACK SURG.  . CERVICAL DISC SURGERY  2005   C5 - 6  . CHOLECYSTECTOMY  1994  . DORSAL COMPARTMENT RELEASE Right 04/07/2013   Procedure: RIGHT WRIST STS RELEASE;  Surgeon: Schuyler Amor, MD;  Location: [facility];  Service: Orthopedics;  Laterality: Right;  . EXPLORATION OF INCISION FOR CSF LEAK  DEC 2011  X2   POST LAMINECOTMY  . FINGER ARTHRODESIS Right 04/07/2013   Procedure: RIGHT INDEX AND RIGHT LONG DISTAL INTERPHALANGEAL JOINT FUSIONS;  Surgeon: Schuyler Amor, MD;  Location: [place];  Service: Orthopedics;  Laterality: Right;  . FINGER ARTHROPLASTY  Right 04/07/2013   Procedure: RIGHT THUMB [place] ARTHROPLASTY;  Surgeon: Schuyler Amor, MD;  Location: [place];  Service: Orthopedics;  Laterality: Right;  . GANGLION CYST EXCISION Right 04/07/2013   Procedure: RIGHT WRIST MASS EXCISION;  Surgeon: Schuyler Amor, MD;  Location: [place];  Service: Orthopedics;  Laterality: Right;  . KNEE ARTHROSCOPY  LEFT X3 (LAST ONE 2005)  . KNEE ARTHROSCOPY  05/17/2011   Procedure: ARTHROSCOPY KNEE;  Surgeon: Bradley Ferris III;  Location: [place];  Service: Orthopedics;  Laterality: Right;  WITH MEDIAL  MENISECTOMY AND removal of suprapatella fat lump  . LEFT WRIST TENOSYNECTOMY W/ LEFT THUMB JOINT REPAIR  02-24-10  . LUMBAR FUSION  11-30-10   L4 - 5  . LUMBAR FUSION  2000   L2 - 4  . LUMBAR LAMINECTOMY  DEC 2011   L4 - 5  . TENDON REPAIR  JAN 2010   LEFT INDEX AND LONG FINGERS   Past Medical History:  Diagnosis Date  . Acute sinusitis, unspecified   . Anxiety state, unspecified   . Arachnoiditis BILATERAL LEGS   DUE TO MULTIPLE BACK SURG.'S  . Arthritis   . Asthma   . Blood transfusion   . Cardiomyopathy HX --06/2010   EF was 25% during acute illness (PHELONEPHRITIS) Repeat echo 12-06-10 60% showed normal EF.   Marland Kitchen Chronic back pain greater than 3 months duration    S/P BACK SURG'S  . Dyslipidemia   . Dysphagia    some post op cerv fusion 2/14  . Essential hypertension, benign   . GERD (gastroesophageal reflux disease) AND HIATIAL HERNIA   CONTROLLED W/ NEXIUM  . Headache(784.0)   . Heart murmur    DENIES S & S   (ECHO JUN'12 W/ CHART)  . History of chronic bronchitis   . Hx of bladder infections   . Hypertension   . Murmur, heart   . Neuromuscular disorder (HCC)    numbness and tingling  . Osteoporosis   . Other malaise and fatigue   . PONV (postoperative nausea and vomiting)   . Shortness of breath   . Varicosities    venous  . Weakness of both legs DUE TO ARACHNOIDITIS   OCCASIONAL USES CANE   There were no vitals taken for this visit.  Opioid Risk Score:   Fall Risk Score:  `1  Depression screen PHQ 2/9  Depression screen [facility] 2/9 01/05/2017 01/20/2016 12/20/2015 07/05/2015 03/08/2015 01/06/2015 09/16/2014  Decreased Interest 0 0 0 1 1 1 1   Down, Depressed, Hopeless 0 0 0 1 1 1 1   PHQ - 2 Score 0 0 0 2 2 2 2   Altered sleeping - - - - - - 2  Tired, decreased energy - - - - - - 2  Change in appetite - - - - - - 1  Feeling bad or failure about yourself  - - - - - - 1  Trouble concentrating - - - - - - 2  Moving slowly or fidgety/restless - - - - - - 0  Suicidal  thoughts - - - - - - 0  PHQ-9 Score - - - - - - 10  Some recent data might be hidden     Review of Systems  Constitutional: Negative.   HENT: Negative.   Eyes: Negative.   Respiratory: Negative.   Cardiovascular: Negative.   Gastrointestinal: Negative.   Endocrine: Negative.   Genitourinary: Negative.   Musculoskeletal: Negative.  Skin: Negative.   Allergic/Immunologic: Negative.   Neurological: Negative.   Hematological: Negative.   Psychiatric/Behavioral: Negative.   All other systems reviewed and are negative.      Objective:   Physical Exam  Constitutional: She is oriented to person, place, and time. She appears well-developed and well-nourished.  HENT:  Head: Normocephalic and atraumatic.  Neck: Normal range of motion. Neck supple.  Cardiovascular: Normal rate and regular rhythm.   Pulmonary/Chest: Effort normal and breath sounds normal.  Musculoskeletal:  Normal Muscle Bulk and Muscle Testing Reveals: Upper Extremities: Decreased ROM 90 Degrees and Muscle Strength on the Right 4/5 and Left 3/5 Bilateral AC Joint Tenderness Thoracic hypersensitivity: T-3-T-7 Lumbar Hypersensitivity Lower Extremities: Full ROM and Muscle Strength 5/5 Bilateral Lower Extremities Flexion Produces Pain into extremities Arises from Table Slowly Antalgic Gait  Neurological: She is alert and oriented to person, place, and time.  Skin: Skin is warm and dry.  Psychiatric: She has a normal mood and affect.  Nursing note and vitals reviewed.         Assessment & Plan:  1. 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 02/06/2017 Refilled:Oxycodone 15mg  one tablet every 6 hours as needed #120 and  Oxymorphone HCL 40 mg every 12 hours #60. We will continue the opioid monitoring program, this consists of regular clinic visits, examinations, urine drug screen, pill counts as well as use of [place] Controlled Substance Reporting System. 2.  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 Continue Gabapentin 600mg  QID. Encourage Posture and Stretching Exercise. 02/06/2017 3. Anxiety/depression: Continue Valium and monitor. 02/06/2017 4. Muscle Spasms: Continue to Monitor. 02/06/2017 5. Neck Pain/ Radiculopathy: Continue Gabapentin:  Dr. Vertell Limber Following: Surgery Planned on 04/03/2017: .Dr. Vertell Limber will be forming C5C6C7 T1 Posterior Cervical Fusion with lateral mass fixation with AIRO Imaging, revision of prior instrumentation: 02/06/2017  20 minutes of face to face patient care time was spent during this visit. All questions were encouraged and answered.  F/U in 1 month

## 2017-02-09 ENCOUNTER — Encounter: Payer: Self-pay | Admitting: Registered Nurse

## 2017-02-23 ENCOUNTER — Telehealth: Payer: Self-pay | Admitting: Registered Nurse

## 2017-02-23 NOTE — Telephone Encounter (Signed)
Placed a call to Ms. Scarbrough, no answer left message.

## 2017-02-27 ENCOUNTER — Ambulatory Visit (INDEPENDENT_AMBULATORY_CARE_PROVIDER_SITE_OTHER): Payer: Medicare Other | Admitting: Internal Medicine

## 2017-02-27 ENCOUNTER — Encounter: Payer: Self-pay | Admitting: Internal Medicine

## 2017-02-27 VITALS — BP 124/82 | HR 74 | Ht 65.0 in | Wt 179.8 lb

## 2017-02-27 DIAGNOSIS — J387 Other diseases of larynx: Secondary | ICD-10-CM | POA: Diagnosis not present

## 2017-02-27 DIAGNOSIS — Z23 Encounter for immunization: Secondary | ICD-10-CM | POA: Diagnosis not present

## 2017-02-27 DIAGNOSIS — J4530 Mild persistent asthma, uncomplicated: Secondary | ICD-10-CM | POA: Diagnosis not present

## 2017-02-27 DIAGNOSIS — Z01811 Encounter for preprocedural respiratory examination: Secondary | ICD-10-CM | POA: Diagnosis not present

## 2017-02-27 DIAGNOSIS — Z79899 Other long term (current) drug therapy: Secondary | ICD-10-CM | POA: Diagnosis not present

## 2017-02-27 DIAGNOSIS — E784 Other hyperlipidemia: Secondary | ICD-10-CM | POA: Diagnosis not present

## 2017-02-27 DIAGNOSIS — E1165 Type 2 diabetes mellitus with hyperglycemia: Secondary | ICD-10-CM | POA: Diagnosis not present

## 2017-02-27 DIAGNOSIS — I10 Essential (primary) hypertension: Secondary | ICD-10-CM | POA: Diagnosis not present

## 2017-02-27 LAB — NITRIC OXIDE: NITRIC OXIDE: 25

## 2017-02-27 NOTE — Patient Instructions (Addendum)
ICD-10-CM   1. Mild persistent asthma, uncomplicated H96.22 Nitric oxide  2. Irritable larynx J38.7   3. 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

## 2017-02-27 NOTE — Progress Notes (Signed)
Subjective:     Patient ID: [name] [name], female   DOB: 08-Jul-1950, 66 y.o.   MRN: [id number]  HPI     OV 04/18/2016  Chief Complaint  Patient presents with  . Follow-up    Pt states voice is raspy. Pt c/o of some congestion more in morning, no tightness, and SOB w/ and w/o excertions. Pt states cough comes and goes.     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 In interim has seen others. In [place] at beach s[place]p syncope and near cardiac arrest due to dehydratiin per her hx. Since then not the same. Last week having right infrascapular pain that gets worse when she lifts her hand. Also last week or so increased hoarseness of voice is increased cough and wheezing production.She is demanding to have arepeat CT scan of chest for her 16mm nodules.She is extremely worried about cancer.   OV 02/27/2017  Chief Complaint  Patient presents with  . Follow-up    6 month f/u, moderate asthma, been doing ok but started wheezing at night when she lays down, allergies causing congestion    Follow-up asthma based on methacholine challenge test positivity and chronic hoarseness of voice  Last seen October 2017. She says for the last 7-8 months she's been having nocturnal wheezing that is waking up at night. But in the daytime she does not have any wheezing. She is also also having dysphagia that is chronic. She was referred to ENT particularly in the context of having to have C-spine surgery. Apparently her  Vocal cords were inflamed and he was then asked to take double dose of Protonix which seemed to help the cough but not fully. This is frustrating her. She struggling with understanding the pathophysiology of this. She's taking Symbicort and singulair regularly. She has lost 15 pounds of weight in the last few weeks intentionally according to her and so she does not think acid reflux is a problem.    asthma  control questionnaire shows that at night she is waking up a few times because of perceived asthma symptoms. When she wakes up she is asymptomatic and she is only very slightly limited in her activities by asthma. She is only experiencing very little shortness of breath because of asthma. She is wheezing only a little of the time at night but is using a lot of albuterol for rescue. 5 point average score is 1  feno is 25 ppb and normal 02/27/2017  She is asking for =- new issue of pre op clearance prior to C spine surgery next month   Results for REDITH, DRACH (MRN [id number]) as of 02/27/2017 15:30  Ref. Range 12/24/2014 16:53  Nitric Oxide Unknown 27     has a past medical history of Acute sinusitis, unspecified; Anxiety state, unspecified; Arachnoiditis (BILATERAL LEGS); Arthritis; Asthma; Blood transfusion; Cardiomyopathy (HX --06/2010); Chronic back pain greater than 3 months duration; Dyslipidemia; Dysphagia; Essential hypertension, benign; GERD (gastroesophageal reflux disease) (AND HIATIAL HERNIA); Headache(784.0); Heart murmur; History of chronic bronchitis; bladder infections; Hypertension; Murmur, heart; Neuromuscular disorder ([place]); Osteoporosis; Other malaise and fatigue; PONV (postoperative nausea and vomiting); Shortness of breath; Varicosities; and Weakness of both legs (DUE TO ARACHNOIDITIS).   reports that she has never smoked. She has never used smokeless tobacco.  Past Surgical History:  Procedure Laterality Date  . ABDOMINAL HYSTERECTOMY  1987   W/ BSO  . ANTERIOR  CERVICAL DECOMP/DISCECTOMY FUSION N/A 08/09/2012   Procedure: ANTERIOR CERVICAL DECOMPRESSION/DISCECTOMY FUSION 1 LEVEL;  Surgeon: Floyce Stakes, MD;  Location: [facility];  Service: Neurosurgery;  Laterality: N/A;  Cervical four-five  Anterior cervical decompression/diskectomy/fusion  . ANTERIOR FUSION CERVICAL SPINE  2007   C5 -6  . CARPAL TUNNEL RELEASE  RIGHT - 2001  & DEC 2011 W/ BACK SURG.  . CERVICAL  DISC SURGERY  2005   C5 - 6  . CHOLECYSTECTOMY  1994  . DORSAL COMPARTMENT RELEASE Right 04/07/2013   Procedure: RIGHT WRIST STS RELEASE;  Surgeon: Schuyler Amor, MD;  Location: [place];  Service: Orthopedics;  Laterality: Right;  . EXPLORATION OF INCISION FOR CSF LEAK  DEC 2011  X2   POST LAMINECOTMY  . FINGER ARTHRODESIS Right 04/07/2013   Procedure: RIGHT INDEX AND RIGHT LONG DISTAL INTERPHALANGEAL JOINT FUSIONS;  Surgeon: Schuyler Amor, MD;  Location: [place];  Service: Orthopedics;  Laterality: Right;  . FINGER ARTHROPLASTY Right 04/07/2013   Procedure: RIGHT THUMB [place] ARTHROPLASTY;  Surgeon: Schuyler Amor, MD;  Location: [place];  Service: Orthopedics;  Laterality: Right;  . GANGLION CYST EXCISION Right 04/07/2013   Procedure: RIGHT WRIST MASS EXCISION;  Surgeon: Schuyler Amor, MD;  Location: [place];  Service: Orthopedics;  Laterality: Right;  . KNEE ARTHROSCOPY  LEFT X3 (LAST ONE 2005)  . KNEE ARTHROSCOPY  05/17/2011   Procedure: ARTHROSCOPY KNEE;  Surgeon: Bradley Ferris III;  Location: [place];  Service: Orthopedics;  Laterality: Right;  WITH MEDIAL MENISECTOMY AND removal of suprapatella fat lump  . LEFT WRIST TENOSYNECTOMY W/ LEFT THUMB JOINT REPAIR  02-24-10  . LUMBAR FUSION  11-30-10   L4 - 5  . LUMBAR FUSION  2000   L2 - 4  . LUMBAR LAMINECTOMY  DEC 2011   L4 - 5  . TENDON REPAIR  JAN 2010   LEFT INDEX AND LONG FINGERS    Allergies  Allergen Reactions  . Latex Itching and Rash  . Morphine And Related Hives and Itching  . Aspirin Nausea And Vomiting    Can take coated asa  . Biaxin [Clarithromycin] Nausea And Vomiting  . Clarithromycin Diarrhea  . Codeine Itching  . Darvocet [Propoxyphene N-Acetaminophen] Itching  . Hydrocodone-Acetaminophen Itching  . Lortab [Hydrocodone-Acetaminophen] Itching  . Percocet [Oxycodone-Acetaminophen] Itching     Immunization History  Administered Date(s) Administered  . Influenza Split 04/19/2013, 03/18/2014  . Influenza,inj,Quad PF,6+ Mos 05/04/2015  . Pneumococcal Polysaccharide-23 04/19/2013  . Zoster 05/30/2013    Family History  Problem Relation Age of Onset  . Hypertension Mother   . Heart disease Mother   . Diabetes Mother   . Cancer Father   . Diabetes Brother      Current Outpatient Prescriptions:  .  alendronate (FOSAMAX) 70 MG tablet, Take 70 mg by mouth once a week. Take with a full glass of water on an empty stomach., Disp: , Rfl:  .  aspirin EC 81 MG tablet, Take 81 mg by mouth See admin instructions. Every other day., Disp: , Rfl:  .  budesonide-formoterol (SYMBICORT) 80-4.5 MCG/ACT inhaler, Inhale 2 puffs into the lungs 2 (two) times daily., Disp: 1 Inhaler, Rfl: 6 .  Calcium Carbonate-Vit D-Min 1200-1000 MG-UNIT CHEW, Chew 1 tablet by mouth 2 (two) times daily. , Disp: , Rfl:  .  diazepam (VALIUM) 10 MG tablet, Take 10 mg by mouth 3 (three) times daily as needed (  muscle spasms). , Disp: , Rfl:  .  diltiazem (CARDIZEM CD) 240 MG 24 hr capsule, Take 240 mg by mouth daily., Disp: , Rfl:  .  esomeprazole (NEXIUM) 40 MG capsule, Take 40 mg by mouth every morning. , Disp: , Rfl:  .  estradiol (ESTRACE) 2 MG tablet, , Disp: , Rfl:  .  gabapentin (NEURONTIN) 600 MG tablet, Take 1 tablet (600 mg total) by mouth 4 (four) times daily. Take 1 tablet twice daily and two tablets at bedtime, Disp: 360 tablet, Rfl: 2 .  JINTELI 1-5 MG-MCG TABS tablet, , Disp: , Rfl:  .  levalbuterol (XOPENEX HFA) 45 MCG/ACT inhaler, Inhale 1-2 puffs into the lungs every 4 (four) hours as needed. For shortness of breath, Disp: 1 Inhaler, Rfl: 6 .  losartan-hydrochlorothiazide (HYZAAR) 100-25 MG tablet, TAKE ONE TABLET BY MOUTH EVERY DAY, Disp: , Rfl:  .  metFORMIN (GLUCOPHAGE) 500 MG tablet, Take 500 mg by mouth 2 (two) times daily with a meal. , Disp: , Rfl:  .  montelukast (SINGULAIR) 10 MG tablet,  Take 10 mg by mouth at bedtime., Disp: , Rfl:  .  oxyCODONE (ROXICODONE) 15 MG immediate release tablet, Take 1 tablet (15 mg total) by mouth every 6 (six) hours as needed. For pain, Disp: 120 tablet, Rfl: 0 .  Oxymorphone HCl, Crush Resist, 40 MG PO T12A, Take 40 mg by mouth 2 (two) times daily., Disp: 60 tablet, Rfl: 0 .  simvastatin (ZOCOR) 10 MG tablet, Take 1 tablet by mouth at bedtime., Disp: , Rfl:  .  valACYclovir (VALTREX) 500 MG tablet, Take 1,000 mg by mouth daily. , Disp: , Rfl:  .  venlafaxine XR (EFFEXOR-XR) 75 MG 24 hr capsule, Take 1 capsule by mouth daily., Disp: , Rfl:  .  vitamin B-12 (CYANOCOBALAMIN) 1000 MCG tablet, Take 1,000 mcg by mouth daily. , Disp: , Rfl:  No current facility-administered medications for this visit.   Facility-Administered Medications Ordered in Other Visits:  .  levalbuterol (XOPENEX) nebulizer solution 0.63 mg, 0.63 mg, Nebulization, Once, Parrett, Tammy S, NP  Review of Systems     Objective:   Physical Exam  Constitutional: She is oriented to person, place, and time. She appears well-developed and well-nourished. No distress.  HENT:  Head: Normocephalic and atraumatic.  Right Ear: External ear normal.  Left Ear: External ear normal.  Mouth/Throat: Oropharynx is clear and moist. No oropharyngeal exudate.  Eyes: Pupils are equal, round, and reactive to light. Conjunctivae and EOM are normal. Right eye exhibits no discharge. Left eye exhibits no discharge. No scleral icterus.  Neck: Normal range of motion. Neck supple. No JVD present. No tracheal deviation present. No thyromegaly present.  Cardiovascular: Normal rate, regular rhythm, normal heart sounds and intact distal pulses.  Exam reveals no gallop and no friction rub.   No murmur heard. Pulmonary/Chest: Effort normal and breath sounds normal. No respiratory distress. She has no wheezes. She has no rales. She exhibits no tenderness.  Abdominal: Soft. Bowel sounds are normal. She exhibits no  distension and no mass. There is no tenderness. There is no rebound and no guarding.  Musculoskeletal: Normal range of motion. She exhibits no edema or tenderness.  Left varicose veins +  Lymphadenopathy:    She has no cervical adenopathy.  Neurological: She is alert and oriented to person, place, and time. She has normal reflexes. No cranial nerve deficit. She exhibits normal muscle tone. Coordination normal.  Skin: Skin is warm and dry. No rash noted. She  is not diaphoretic. No erythema. No pallor.  Psychiatric: She has a normal mood and affect. Her behavior is normal. Judgment and thought content normal.  Vitals reviewed.   Vitals:   02/27/17 1456  BP: 124/82  Pulse: 74  SpO2: 97%  Weight: 179 lb 12.8 oz (81.6 kg)  Height: 5\' 5"  (1.651 m)    ,Body mass index is 29.92 kg/m.      Assessment:       ICD-10-CM   1. Mild persistent asthma, uncomplicated F79.03   2. 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 Brand Males, M.D., [facility].C.P Pulmonary and Critical Care Medicine Staff Physician [facility] Pager: [phone], If no answer or between  15:00h - 7:00h: call [phone]  02/27/2017 3:59 PM

## 2017-03-05 ENCOUNTER — Encounter: Payer: Medicare Other | Attending: Physical Medicine & Rehabilitation | Admitting: Registered Nurse

## 2017-03-05 ENCOUNTER — Encounter: Payer: Self-pay | Admitting: Registered Nurse

## 2017-03-05 VITALS — BP 98/54 | HR 89

## 2017-03-05 DIAGNOSIS — G894 Chronic pain syndrome: Secondary | ICD-10-CM | POA: Diagnosis not present

## 2017-03-05 DIAGNOSIS — G039 Meningitis, unspecified: Secondary | ICD-10-CM

## 2017-03-05 DIAGNOSIS — M5416 Radiculopathy, lumbar region: Secondary | ICD-10-CM | POA: Diagnosis not present

## 2017-03-05 DIAGNOSIS — Z79899 Other long term (current) drug therapy: Secondary | ICD-10-CM

## 2017-03-05 DIAGNOSIS — M5441 Lumbago with sciatica, right side: Secondary | ICD-10-CM | POA: Diagnosis not present

## 2017-03-05 DIAGNOSIS — M961 Postlaminectomy syndrome, not elsewhere classified: Secondary | ICD-10-CM

## 2017-03-05 DIAGNOSIS — M5412 Radiculopathy, cervical region: Secondary | ICD-10-CM

## 2017-03-05 DIAGNOSIS — Z5181 Encounter for therapeutic drug level monitoring: Secondary | ICD-10-CM

## 2017-03-05 MED ORDER — OXYCODONE HCL 15 MG PO TABS: 15.0000 mg | ORAL_TABLET | Freq: Four times a day (QID) | ORAL | 0 refills | Status: DC | PRN

## 2017-03-05 MED ORDER — OXYMORPHONE HCL ER 40 MG PO T12A: 40.0000 mg | EXTENDED_RELEASE_TABLET | Freq: Two times a day (BID) | ORAL | 0 refills | Status: DC

## 2017-03-05 NOTE — Progress Notes (Signed)
Subjective:    Patient ID: [name] [name], female    DOB: 08-26-1950, 66 y.o.   MRN: [id number]  HPI:  [name] [name] is a 66year old female who returns for follow up appointmentfor chronic pain and medication refill. She states her pain is located in her neck radiating into her bilateral upper extremities L>R and mid- lower back radiating into her lower extremities.She rates her pain 8. Her current exercise regime is walking.  Also states  Last Thursday 03/01/2017 she was getting into the bathtub when she believes she slid on liquid soao, states she was able to brace herself. States she hit her lower back against the bathtub and hit her right foot against the tub. She didn't seek medical attention. No bruising noted.   [name] [name] states she has been having periods of hypoglycemia states her blood sugar was 101 this morning, stated she feels weak, refuses ED evaluation. Stated she will follow up with her PCP. Instructed to keep blood sugar log, she verbalizes understanding.   [name] [name] is scheduled for surgery on 04/03/2017: Dr. Vertell Limber will be forming C5C6C7 T1 Posterior Cervical Fusion with lateral mass fixation with AIRO Imaging, revision of prior instrumentation.   Last UDS was performed on 12/05/2016, it was consistent.   Pain Inventory Average Pain 7 Pain Right Now 8 My pain is sharp, burning, stabbing and tingling  In the last 24 hours, has pain interfered with the following? General activity 7 Relation with others 7 Enjoyment of life 7 What TIME of day is your pain at its worst? all Sleep (in general) Fair  Pain is worse with: walking, sitting, standing and some activites Pain improves with: heat/ice and medication Relief from Meds: 7  Mobility walk without assistance ability to climb steps?  yes do you drive?  yes  Function Do you have any goals in this area?  no  Neuro/Psych No problems in this area  Prior Studies Any changes since last visit?   no  Physicians involved in your care Any changes since last visit?  no   Family History  Problem Relation Age of Onset  . Hypertension Mother   . Heart disease Mother   . Diabetes Mother   . Cancer Father   . Diabetes Brother    Social History   Social History  . Marital status: Married    Spouse name: N/A  . Number of children: N/A  . Years of education: N/A   Occupational History  . disable Disabled   Social History Main Topics  . Smoking status: Never Smoker  . Smokeless tobacco: Never Used  . Alcohol use No  . Drug use: No  . Sexual activity: Not Asked     Comment: second hand smoke   Other Topics Concern  . None   Social History Narrative   Has one child   Disabled   Past Surgical History:  Procedure Laterality Date  . ABDOMINAL HYSTERECTOMY  1987   W/ BSO  . ANTERIOR CERVICAL DECOMP/DISCECTOMY FUSION N/A 08/09/2012   Procedure: ANTERIOR CERVICAL DECOMPRESSION/DISCECTOMY FUSION 1 LEVEL;  Surgeon: Floyce Stakes, MD;  Location: [facility];  Service: Neurosurgery;  Laterality: N/A;  Cervical four-five  Anterior cervical decompression/diskectomy/fusion  . ANTERIOR FUSION CERVICAL SPINE  2007   C5 -6  . CARPAL TUNNEL RELEASE  RIGHT - 2001  & DEC 2011 W/ BACK SURG.  . CERVICAL DISC SURGERY  2005   C5 - 6  . CHOLECYSTECTOMY  1994  .  DORSAL COMPARTMENT RELEASE Right 04/07/2013   Procedure: RIGHT WRIST STS RELEASE;  Surgeon: Schuyler Amor, MD;  Location: [place];  Service: Orthopedics;  Laterality: Right;  . EXPLORATION OF INCISION FOR CSF LEAK  DEC 2011  X2   POST LAMINECOTMY  . FINGER ARTHRODESIS Right 04/07/2013   Procedure: RIGHT INDEX AND RIGHT LONG DISTAL INTERPHALANGEAL JOINT FUSIONS;  Surgeon: Schuyler Amor, MD;  Location: [place];  Service: Orthopedics;  Laterality: Right;  . FINGER ARTHROPLASTY Right 04/07/2013   Procedure: RIGHT THUMB [place] ARTHROPLASTY;  Surgeon: Schuyler Amor, MD;  Location: [place];  Service: Orthopedics;  Laterality: Right;  . GANGLION CYST EXCISION Right 04/07/2013   Procedure: RIGHT WRIST MASS EXCISION;  Surgeon: Schuyler Amor, MD;  Location: [place];  Service: Orthopedics;  Laterality: Right;  . KNEE ARTHROSCOPY  LEFT X3 (LAST ONE 2005)  . KNEE ARTHROSCOPY  05/17/2011   Procedure: ARTHROSCOPY KNEE;  Surgeon: Bradley Ferris III;  Location: [place];  Service: Orthopedics;  Laterality: Right;  WITH MEDIAL MENISECTOMY AND removal of suprapatella fat lump  . LEFT WRIST TENOSYNECTOMY W/ LEFT THUMB JOINT REPAIR  02-24-10  . LUMBAR FUSION  11-30-10   L4 - 5  . LUMBAR FUSION  2000   L2 - 4  . LUMBAR LAMINECTOMY  DEC 2011   L4 - 5  . TENDON REPAIR  JAN 2010   LEFT INDEX AND LONG FINGERS   Past Medical History:  Diagnosis Date  . Acute sinusitis, unspecified   . Anxiety state, unspecified   . Arachnoiditis BILATERAL LEGS   DUE TO MULTIPLE BACK SURG.'S  . Arthritis   . Asthma   . Blood transfusion   . Cardiomyopathy HX --06/2010   EF was 25% during acute illness (PHELONEPHRITIS) Repeat echo 12-06-10 60% showed normal EF.   Marland Kitchen Chronic back pain greater than 3 months duration    S/P BACK SURG'S  . Dyslipidemia   . Dysphagia    some post op cerv fusion 2/14  . Essential hypertension, benign   . GERD (gastroesophageal reflux disease) AND HIATIAL HERNIA   CONTROLLED W/ NEXIUM  . Headache(784.0)   . Heart murmur    DENIES S & S   (ECHO JUN'12 W/ CHART)  . History of chronic bronchitis   . Hx of bladder infections   . Hypertension   . Murmur, heart   . Neuromuscular disorder (HCC)    numbness and tingling  . Osteoporosis   . Other malaise and fatigue   . PONV (postoperative nausea and vomiting)   . Shortness of breath   . Varicosities    venous  . Weakness of both legs DUE TO ARACHNOIDITIS   OCCASIONAL USES CANE   BP (!) 104/58   Pulse 89   SpO2 95%   Opioid Risk Score:  2 Fall Risk Score:   `1  Depression screen PHQ 2/9  Depression screen [facility] 2/9 03/05/2017 01/05/2017 01/20/2016 12/20/2015 07/05/2015 03/08/2015 01/06/2015  Decreased Interest 0 0 0 0 1 1 1   Down, Depressed, Hopeless 0 0 0 0 1 1 1   PHQ - 2 Score 0 0 0 0 2 2 2   Altered sleeping - - - - - - -  Tired, decreased energy - - - - - - -  Change in appetite - - - - - - -  Feeling bad or failure about yourself  - - - - - - -  Trouble concentrating - - - - - - -  Moving slowly or fidgety/restless - - - - - - -  Suicidal thoughts - - - - - - -  PHQ-9 Score - - - - - - -  Some recent data might be hidden     Review of Systems  Constitutional: Negative.   HENT: Negative.   Eyes: Negative.   Respiratory: Negative.   Cardiovascular: Negative.   Gastrointestinal: Negative.   Endocrine: Negative.   Genitourinary: Negative.   Musculoskeletal: Negative.   Skin: Negative.   Allergic/Immunologic: Negative.   Neurological: Negative.   Hematological: Negative.   Psychiatric/Behavioral: Negative.   All other systems reviewed and are negative.      Objective:   Physical Exam  Constitutional: She is oriented to person, place, and time. She appears well-developed and well-nourished.  HENT:  Head: Normocephalic and atraumatic.  Neck: Normal range of motion. Neck supple.  Cardiovascular: Normal rate and regular rhythm.   Pulmonary/Chest: Effort normal and breath sounds normal.  Musculoskeletal:  Normal Muscle Bulk and Muscle Testing Reveals: Upper Extremities: Decreased ROM 90 Degrees and Muscle Strength on the Right 4/5 and Left 3/5 Right AC Joint Tenderness Thoracic hypersensitivity Lumbar Paraspinal Tenderness: L-3-L-5 Lower Extremities: Full ROM and Muscle Strength 5/5 Arises from Table Slowly using straight cane for support Antalgic Gait  Neurological: She is alert and oriented to person, place, and time.  Skin: Skin is warm and dry.  Psychiatric: She has a normal mood and affect.  Nursing note and vitals  reviewed.         Assessment & Plan:  1. 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 Continue Gabapentin.  03/05/2017 Refilled:Oxycodone 15mg  one tablet every 6 hours as needed #120 and  Oxymorphone HCL 40 mg every 12 hours #60. We will continue the opioid monitoring program, this consists of regular clinic visits, examinations, urine drug screen, pill counts as well as use of [place] Controlled Substance Reporting System. 2. 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 Continue Gabapentin 600mg  QID. Encourage Posture and Stretching Exercise. 03/05/2017 3. Anxiety/depression: Continue Valium, refuses counseling at time. Continue  To monitor. 03/05/2017 4. Muscle Spasms: No complaints today. Continue to Monitor. 03/05/2017 5. Neck Pain/ Radiculopathy: Continue Gabapentin:  Dr. Vertell Limber Following: Surgery Planned on 04/03/2017: .Dr. Vertell Limber will be forming C5C6C7 T1 Posterior Cervical Fusion with lateral mass fixation with AIRO Imaging, revision of prior instrumentation: 03/05/2017  20 minutes of face to face patient care time was spent during this visit. All questions were encouraged and answered.  F/U in 1 month

## 2017-03-13 ENCOUNTER — Telehealth: Payer: Self-pay | Admitting: Internal Medicine

## 2017-03-13 DIAGNOSIS — I10 Essential (primary) hypertension: Secondary | ICD-10-CM | POA: Diagnosis not present

## 2017-03-13 DIAGNOSIS — J441 Chronic obstructive pulmonary disease with (acute) exacerbation: Secondary | ICD-10-CM | POA: Diagnosis not present

## 2017-03-13 DIAGNOSIS — E1165 Type 2 diabetes mellitus with hyperglycemia: Secondary | ICD-10-CM | POA: Diagnosis not present

## 2017-03-13 MED ORDER — PREDNISONE 10 MG PO TABS: ORAL_TABLET | ORAL | 0 refills | Status: DC

## 2017-03-13 MED ORDER — DOXYCYCLINE HYCLATE 100 MG PO TABS: 100.0000 mg | ORAL_TABLET | Freq: Two times a day (BID) | ORAL | 0 refills | Status: DC

## 2017-03-13 NOTE — Telephone Encounter (Signed)
Called and spoke with pt. Pt reports of chest congestion, wheezing, increased sob & prod cough with clear mucus x2d Pt is requesting Rx for prednisone and abx, as she has upcoming surgery. Pharmacy is [facility]  MR please advise. Thanks.

## 2017-03-13 NOTE — Telephone Encounter (Signed)
lmtcb x1 for pt. 

## 2017-03-13 NOTE — Telephone Encounter (Signed)
Patient is returning phone call.  °

## 2017-03-13 NOTE — Telephone Encounter (Signed)
She could try  Take doxycycline 100mg  po twice daily x 5 days; take after meals and avoid sunlight Please take prednisone 40 mg x1 day, then 30 mg x1 day, then 20 mg x1 day, then 10 mg x1 day, and then 5 mg x1 day and stop    Allergies  Allergen Reactions  . Latex Itching and Rash  . Morphine And Related Hives and Itching  . Aspirin Nausea And Vomiting    Can take coated asa  . Biaxin [Clarithromycin] Nausea And Vomiting  . Clarithromycin Diarrhea  . Codeine Itching  . Darvocet [Propoxyphene N-Acetaminophen] Itching  . Hydrocodone-Acetaminophen Itching  . Lortab [Hydrocodone-Acetaminophen] Itching  . Percocet [Oxycodone-Acetaminophen] Itching

## 2017-03-13 NOTE — Telephone Encounter (Signed)
Pt is aware of MR's recommendations and voiced his understanding. Rx has been sent to preferred pharmacy. Nothing further needed.

## 2017-03-23 DIAGNOSIS — J208 Acute bronchitis due to other specified organisms: Secondary | ICD-10-CM | POA: Diagnosis not present

## 2017-03-28 DIAGNOSIS — M542 Cervicalgia: Secondary | ICD-10-CM | POA: Diagnosis not present

## 2017-03-28 DIAGNOSIS — I10 Essential (primary) hypertension: Secondary | ICD-10-CM | POA: Diagnosis not present

## 2017-03-28 DIAGNOSIS — Z6832 Body mass index (BMI) 32.0-32.9, adult: Secondary | ICD-10-CM | POA: Diagnosis not present

## 2017-04-02 ENCOUNTER — Encounter: Payer: Medicare Other | Attending: Physical Medicine & Rehabilitation | Admitting: Physical Medicine & Rehabilitation

## 2017-04-02 ENCOUNTER — Encounter: Payer: Self-pay | Admitting: Physical Medicine & Rehabilitation

## 2017-04-02 VITALS — BP 121/71 | HR 73

## 2017-04-02 DIAGNOSIS — M961 Postlaminectomy syndrome, not elsewhere classified: Secondary | ICD-10-CM | POA: Diagnosis not present

## 2017-04-02 DIAGNOSIS — G039 Meningitis, unspecified: Secondary | ICD-10-CM | POA: Insufficient documentation

## 2017-04-02 DIAGNOSIS — Z79899 Other long term (current) drug therapy: Secondary | ICD-10-CM | POA: Diagnosis not present

## 2017-04-02 DIAGNOSIS — M5441 Lumbago with sciatica, right side: Secondary | ICD-10-CM | POA: Insufficient documentation

## 2017-04-02 DIAGNOSIS — Z5181 Encounter for therapeutic drug level monitoring: Secondary | ICD-10-CM | POA: Insufficient documentation

## 2017-04-02 MED ORDER — OXYMORPHONE HCL ER 40 MG PO T12A: 40.0000 mg | EXTENDED_RELEASE_TABLET | Freq: Two times a day (BID) | ORAL | 0 refills | Status: DC

## 2017-04-02 MED ORDER — OXYCODONE HCL 15 MG PO TABS: 15.0000 mg | ORAL_TABLET | Freq: Four times a day (QID) | ORAL | 0 refills | Status: DC | PRN

## 2017-04-02 MED ORDER — PREDNISONE 20 MG PO TABS: ORAL_TABLET | ORAL | 0 refills | Status: DC

## 2017-04-02 NOTE — Progress Notes (Signed)
Subjective:    Patient ID: [name] [name], female    DOB: December 11, 1950, 66 y.o.   MRN: [id number]  HPI   [name] [name] is here in follow up of her chronic pain. She has surgery set up for early November to re-do her cervical C4-5 level with additional fusion above and below level. Dr. Vertell Limber is her surgeon. It was put off unfortunately by a severe bronchitis which required steroids, abx, etc which she just now got over.  A week ago, she tripped on a broom and jerked her right leg fairly violently. Since the incident she has recurrent shooting pain down the right leg. She can't find a comfortable spot. She is using her heating pad continuously which provides some relief.      Pain Inventory Average Pain 8 Pain Right Now 8 My pain is sharp, burning, stabbing, tingling and aching  In the last 24 hours, has pain interfered with the following? General activity 7 Relation with others 6 Enjoyment of life 8 What TIME of day is your pain at its worst? all Sleep (in general) Fair  Pain is worse with: walking, bending, sitting, standing and some activites Pain improves with: rest and medication Relief from Meds: 7  Mobility walk without assistance  Function Do you have any goals in this area?  no  Neuro/Psych No problems in this area  Prior Studies Any changes since last visit?  no  Physicians involved in your care Any changes since last visit?  no   Family History  Problem Relation Age of Onset  . Hypertension Mother   . Heart disease Mother   . Diabetes Mother   . Cancer Father   . Diabetes Brother    Social History   Social History  . Marital status: Divorced    Spouse name: N/A  . Number of children: N/A  . Years of education: N/A   Occupational History  . disable Disabled   Social History Main Topics  . Smoking status: Never Smoker  . Smokeless tobacco: Never Used  . Alcohol use No  . Drug use: No  . Sexual activity: Not on file     Comment: second hand smoke     Other Topics Concern  . Not on file   Social History Narrative   Has one child   Disabled   Past Surgical History:  Procedure Laterality Date  . ABDOMINAL HYSTERECTOMY  1987   W/ BSO  . ANTERIOR CERVICAL DECOMP/DISCECTOMY FUSION N/A 08/09/2012   Procedure: ANTERIOR CERVICAL DECOMPRESSION/DISCECTOMY FUSION 1 LEVEL;  Surgeon: Floyce Stakes, MD;  Location: [facility];  Service: Neurosurgery;  Laterality: N/A;  Cervical four-five  Anterior cervical decompression/diskectomy/fusion  . ANTERIOR FUSION CERVICAL SPINE  2007   C5 -6  . CARPAL TUNNEL RELEASE  RIGHT - 2001  & DEC 2011 W/ BACK SURG.  . CERVICAL DISC SURGERY  2005   C5 - 6  . CHOLECYSTECTOMY  1994  . DORSAL COMPARTMENT RELEASE Right 04/07/2013   Procedure: RIGHT WRIST STS RELEASE;  Surgeon: Schuyler Amor, MD;  Location: [place];  Service: Orthopedics;  Laterality: Right;  . EXPLORATION OF INCISION FOR CSF LEAK  DEC 2011  X2   POST LAMINECOTMY  . FINGER ARTHRODESIS Right 04/07/2013   Procedure: RIGHT INDEX AND RIGHT LONG DISTAL INTERPHALANGEAL JOINT FUSIONS;  Surgeon: Schuyler Amor, MD;  Location: [place];  Service: Orthopedics;  Laterality: Right;  . FINGER ARTHROPLASTY Right 04/07/2013   Procedure: RIGHT  THUMB [place] ARTHROPLASTY;  Surgeon: Schuyler Amor, MD;  Location: [place];  Service: Orthopedics;  Laterality: Right;  . GANGLION CYST EXCISION Right 04/07/2013   Procedure: RIGHT WRIST MASS EXCISION;  Surgeon: Schuyler Amor, MD;  Location: [place];  Service: Orthopedics;  Laterality: Right;  . KNEE ARTHROSCOPY  LEFT X3 (LAST ONE 2005)  . KNEE ARTHROSCOPY  05/17/2011   Procedure: ARTHROSCOPY KNEE;  Surgeon: Bradley Ferris III;  Location: [place];  Service: Orthopedics;  Laterality: Right;  WITH MEDIAL MENISECTOMY AND removal of suprapatella fat lump  . LEFT WRIST TENOSYNECTOMY W/ LEFT THUMB JOINT REPAIR  02-24-10   . LUMBAR FUSION  11-30-10   L4 - 5  . LUMBAR FUSION  2000   L2 - 4  . LUMBAR LAMINECTOMY  DEC 2011   L4 - 5  . TENDON REPAIR  JAN 2010   LEFT INDEX AND LONG FINGERS   Past Medical History:  Diagnosis Date  . Acute sinusitis, unspecified   . Anxiety state, unspecified   . Arachnoiditis BILATERAL LEGS   DUE TO MULTIPLE BACK SURG.'S  . Arthritis   . Asthma   . Blood transfusion   . Cardiomyopathy HX --06/2010   EF was 25% during acute illness (PHELONEPHRITIS) Repeat echo 12-06-10 60% showed normal EF.   Marland Kitchen Chronic back pain greater than 3 months duration    S/P BACK SURG'S  . Dyslipidemia   . Dysphagia    some post op cerv fusion 2/14  . Essential hypertension, benign   . GERD (gastroesophageal reflux disease) AND HIATIAL HERNIA   CONTROLLED W/ NEXIUM  . Headache(784.0)   . Heart murmur    DENIES S & S   (ECHO JUN'12 W/ CHART)  . History of chronic bronchitis   . Hx of bladder infections   . Hypertension   . Murmur, heart   . Neuromuscular disorder (HCC)    numbness and tingling  . Osteoporosis   . Other malaise and fatigue   . PONV (postoperative nausea and vomiting)   . Shortness of breath   . Varicosities    venous  . Weakness of both legs DUE TO ARACHNOIDITIS   OCCASIONAL USES CANE   There were no vitals taken for this visit.  Opioid Risk Score:   Fall Risk Score:  `1  Depression screen PHQ 2/9  Depression screen [facility] 2/9 03/05/2017 01/05/2017 01/20/2016 12/20/2015 07/05/2015 03/08/2015 01/06/2015  Decreased Interest 0 0 0 0 1 1 1   Down, Depressed, Hopeless 0 0 0 0 1 1 1   PHQ - 2 Score 0 0 0 0 2 2 2   Altered sleeping - - - - - - -  Tired, decreased energy - - - - - - -  Change in appetite - - - - - - -  Feeling bad or failure about yourself  - - - - - - -  Trouble concentrating - - - - - - -  Moving slowly or fidgety/restless - - - - - - -  Suicidal thoughts - - - - - - -  PHQ-9 Score - - - - - - -  Some recent data might be hidden     Review of Systems   Constitutional: Positive for chills and fever.  HENT: Negative.   Eyes: Negative.   Respiratory: Positive for cough, shortness of breath and wheezing.   Cardiovascular: Negative.   Gastrointestinal: Positive for abdominal pain and nausea.  Endocrine: Negative.  Genitourinary: Negative.   Musculoskeletal: Negative.   Skin: Negative.   Allergic/Immunologic: Negative.   Neurological: Negative.   Hematological: Negative.   Psychiatric/Behavioral: Negative.   All other systems reviewed and are negative.      Objective:   Physical Exam Constitutional: She is oriented to person, place, and time. She appears well-developed and well-nourished.  HENT: hoarse voice  Head: Normocephalic and atraumatic.  Eyes: Conjunctivae and EOM are normal. Pupils are equal, round, and reactive to light.  Neck: Normal range of motion. Neck supple.  Cardiovascular: RRR Pulmonary/Chest: normal effort Abdominal: Soft. Bowel sounds are normal. She exhibits no distension.  Musculoskeletal: tenderness with palpation of both greater trochs Cervical back: atrophy of paraspinals. Limited ROM in all planes Lumbar back: lumbar flexion to about 45 degrees with back and leg pain.  Neurological: She is alert and oriented to person, place, and time. No cranial nerve deficit.   strength grossly 4+/5 in UE's  LE's 4/5 pox to distal. Pain in right hamstring to foot with bending at waist and with fhip flexion.  DTR's remain tr in either leg. UE DTR's are 2+.  some antalgia right lower ext Psychiatric: affect pleasant Assessment & Plan:  ASSESSMENT:  1. 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 She has documented C6 radiculitis in the past. She may in fact have scarring in the cervical region also 2. Post lumbar post laminectomy syndrome.  3. 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  4. Anxiety/depression secondary to the above      PLAN:  1. Maintain gabapentin at 600mg  BID and 1200mg  QHS which has helped he symptoms at night.  2. Spinal stimulator is still what is most likely to give her relief for lower extremity pain.   3. Continue Oxymorphone ER 40mg  q12 #60, 0RF 4. Will continue oxy IR 15mg  for breakthrough. q6 hour prn #120. We will continue the opioid monitoring program, this consists of regular clinic visits, examinations, routine drug screening, pill counts as well as use of [place] Controlled Substance Reporting System. [place] today.   5. For the recent flare of her arachnoiditis, a prednisone taper was written. She has had success with this before.    6. Discussed the importance of maintaining strength and flexibility of her core/lower extremities 7. Flexeril and valium prn for spasms. Limit valium as possible.  8. The patient will see me or NP in one month after her cervical surgery.   Fifteen minutes of face to face patient care time were spent during this visit. All questions were encouraged and answered.

## 2017-04-02 NOTE — Patient Instructions (Signed)
PLEASE FEEL FREE TO CALL OUR OFFICE WITH ANY PROBLEMS OR QUESTIONS ([phone])      

## 2017-04-13 DIAGNOSIS — I10 Essential (primary) hypertension: Secondary | ICD-10-CM | POA: Diagnosis not present

## 2017-04-13 DIAGNOSIS — E1165 Type 2 diabetes mellitus with hyperglycemia: Secondary | ICD-10-CM | POA: Diagnosis not present

## 2017-04-13 DIAGNOSIS — J441 Chronic obstructive pulmonary disease with (acute) exacerbation: Secondary | ICD-10-CM | POA: Diagnosis not present

## 2017-04-16 NOTE — H&P (Signed)
Patient ID:   [id number] Patient: [name] [name]  Date of Birth: 07/21/1950 Visit Type: Office Visit   Date: 03/28/2017 01:00 PM Provider: Marchia Meiers. Vertell Limber MD   This 66 year old female presents for pain and back pain.   History of Present Illness: 1.  pain  Patient visits to discuss her upcoming surgery and be fitted for a Vista collar.    2.  back pain       Medical/Surgical/Interim History Reviewed, no change.  Last detailed document date:12/06/2016.     Family History: Reviewed, no changes.    Social History: Reviewed, no changes.   MEDICATIONS(added, continued or stopped this visit): Started Medication Directions Instruction Stopped   Boniva 150mg  *UAD     gabapentin 600 mg tablet take 1 tablet by oral route 3 times every day     metformin 500 mg tablet take 1 tablet by oral route 2 times every day with morning and evening meals     Nexium 40 mg capsule,delayed release take 1 capsule by oral route  every day     Opana ER 40 mg tablet,extended release take 1 tablet by oral route  every 12 hours at least 1 hour before or 2 hours after meals     Premarin 1.25 mg tablet take 1 tablet by oral route  every day for 21 consecutive days, followed by 7 days off     ProAir HFA 90 mcg/actuation aerosol inhaler inhale 2 puff by inhalation route  every 4 - 6 hours as needed     Roxicodone 15 mg tablet take 1 tablet by oral route  every 8 hours     Symbicort 80 mcg-4.5 mcg/actuation HFA aerosol inhaler inhale 2 puff by inhalation route 2 times every day in the morning and evening     Valium 10 mg tablet take 1 tablet by oral route 3 times every day as needed for spasms     Xopenex HFA 45 mcg/actuation aerosol inhaler inhale 2 puff by inhalation route  every 6 hours     Ziac 5 mg-6.25 mg tablet take 1 tablet by oral route  every day       ALLERGIES: Ingredient Reaction Medication Name Comment  CLARITHROMYCIN  Biaxin   OXYCODONE HCL  Percocet   ACETAMINOPHEN  Percocet    ACETAMINOPHEN  Tylenol   LATEX     MORPHINE     ASPIRIN      Reviewed, no changes.    Vitals Date Temp F BP Pulse Ht In Wt Lb BMI BSA Pain Score  03/28/2017  129/82 79 63.5 184.2 32.12  7/10      IMPRESSION [name] [name] visits to discuss her upcoming surgery and be fitted for a Vista collar.  Completed Orders (this encounter) Order Details Reason Side Interpretation Result Initial Treatment Date Region  Hypertension education Patient to follow up with primary care provider.        Dietary management education, guidance, and counseling patient encouraged to eat a well balanced diet         Assessment/Plan # Detail Type Description   1. Assessment Cervicalgia (M54.2).       2. Assessment Essential (primary) hypertension (I10).       3. Assessment Body mass index (BMI) 32.0-32.9, adult (Q00.86).   Plan Orders Today's instructions / counseling include(s) Dietary management education, guidance, and counseling.           Pain Management Plan Pain Scale: 7/10. Method: Numeric Pain Intensity Scale. Location: neck. Onset:  06/20/2015. Duration: varies. Quality: discomforting. Pain management follow-up plan of care: Patient taking medication as prescribed..  Using patient's x-rays, MRI, and surgical models, Dr. Vertell Limber so surgical plan is reviewed, posterior cervical decompression and fusion C5-T1 with revision C5-6.  Patient verbalizes understanding and agrees to proceed as scheduled.  Vista collar is fitted.  Orders: Instruction(s)/Education: Assessment Instruction  I10 Hypertension education  [phone] Dietary management education, guidance, and counseling             Provider:  Vertell Limber MD, Marchia Meiers 03/28/2017 5:33 PM  Dictation edited by: Mike Craze. Poteat RN    CC Providers: Stoney Bang Mullan,  [place]  [id number]   Xaje Hasanaj  [address] [place], [place] [address]-              Electronically signed by Marchia Meiers Vertell Limber MD  on 03/31/2017 01:07 PM

## 2017-04-18 NOTE — Pre-Procedure Instructions (Signed)
GIARA MCGAUGHEY  04/18/2017      [facility] - [place], [place], [place] [address] [place] [address] Phone: [phone] Fax: [phone]    Your procedure is scheduled on Tuesday, November 6th   Report to [facility] at 5:30 am             (posted surgery time 7:30a - 10:40a)   Call this number if you have problems the MORNING of surgery:  [phone].   Remember:              4-5 days prior to surgery, STOP TAKING any vitamins, herbal supplements, anti-inflammatories.   Do not eat food or drink liquids after midnight Monday.   Take these medicines the morning of surgery with A SIP OF WATER : Valium, Nexium, Gabapentin, Pain medication.  Please use your inhalers that morning.   Do not wear jewelry, make-up or nail polish.  Do not wear lotions, powders, perfumes, or deoderant.  Do not shave 48 hours prior to surgery.    Do not bring valuables to the hospital.  [facility] is not responsible for any belongings or valuables.  Contacts, dentures or bridgework may not be worn into surgery.  Leave your suitcase in the car.  After surgery it may be brought to your room. For patients admitted to the hospital, discharge time will be determined by your treatment team.   Please read over the following fact sheets that you were given. Pain Booklet, MRSA Information and Surgical Site Infection Prevention      [place]- Preparing For Surgery  Before surgery, you can play an important role. Because skin is not sterile, your skin needs to be as free of germs as possible. You can reduce the number of germs on your skin by washing with CHG (chlorahexidine gluconate) Soap before surgery.  CHG is an antiseptic cleaner which kills germs and bonds with the skin to continue killing germs even after washing.  Please do not use if you have an allergy to CHG or antibacterial soaps. If your skin becomes reddened/irritated stop using  the CHG.  Do not shave (including legs and underarms) for at least 48 hours prior to first CHG shower. It is OK to shave your face.  Please follow these instructions carefully.   1. Shower the NIGHT BEFORE SURGERY and the MORNING OF SURGERY with CHG.   2. If you chose to wash your hair, wash your hair first as usual with your normal shampoo.  3. After you shampoo, rinse your hair and body thoroughly to remove the shampoo.  4. Use CHG as you would any other liquid soap. You can apply CHG directly to the skin and wash gently with a scrungie or a clean washcloth.   5. Apply the CHG Soap to your body ONLY FROM THE NECK DOWN.  Do not use on open wounds or open sores. Avoid contact with your eyes, ears, mouth and genitals (private parts). Wash Face and genitals (private parts)  with your normal soap.  6. Wash thoroughly, paying special attention to the area where your surgery will be performed.  7. Thoroughly rinse your body with warm water from the neck down.  8. DO NOT shower/wash with your normal soap after using and rinsing off the CHG Soap.  9. Pat yourself dry with a CLEAN TOWEL.  10. Wear CLEAN PAJAMAS to bed the night before surgery, wear comfortable clothes  the morning of surgery  11. Place CLEAN SHEETS on your bed the night of your first shower and DO NOT SLEEP WITH PETS.    Day of Surgery: Do not apply any deodorants/lotions. Please wear clean clothes to the hospital/surgery center.

## 2017-04-19 ENCOUNTER — Encounter (HOSPITAL_COMMUNITY): Payer: Self-pay

## 2017-04-19 ENCOUNTER — Encounter (HOSPITAL_COMMUNITY)
Admission: RE | Admit: 2017-04-19 | Discharge: 2017-04-19 | Disposition: A | Discharge status: Home / Self Care | Payer: Medicare Other | Source: Ambulatory Visit | Attending: Neurosurgery | Admitting: Neurosurgery

## 2017-04-19 ENCOUNTER — Encounter (HOSPITAL_COMMUNITY): Payer: Self-pay | Admitting: Vascular Surgery

## 2017-04-19 DIAGNOSIS — Z7982 Long term (current) use of aspirin: Secondary | ICD-10-CM | POA: Diagnosis not present

## 2017-04-19 DIAGNOSIS — I429 Cardiomyopathy, unspecified: Secondary | ICD-10-CM | POA: Diagnosis not present

## 2017-04-19 DIAGNOSIS — Z7984 Long term (current) use of oral hypoglycemic drugs: Secondary | ICD-10-CM | POA: Insufficient documentation

## 2017-04-19 DIAGNOSIS — K219 Gastro-esophageal reflux disease without esophagitis: Secondary | ICD-10-CM | POA: Insufficient documentation

## 2017-04-19 DIAGNOSIS — E785 Hyperlipidemia, unspecified: Secondary | ICD-10-CM | POA: Insufficient documentation

## 2017-04-19 DIAGNOSIS — J45909 Unspecified asthma, uncomplicated: Secondary | ICD-10-CM | POA: Insufficient documentation

## 2017-04-19 DIAGNOSIS — I10 Essential (primary) hypertension: Secondary | ICD-10-CM | POA: Diagnosis not present

## 2017-04-19 DIAGNOSIS — R011 Cardiac murmur, unspecified: Secondary | ICD-10-CM | POA: Diagnosis not present

## 2017-04-19 DIAGNOSIS — Z79899 Other long term (current) drug therapy: Secondary | ICD-10-CM | POA: Insufficient documentation

## 2017-04-19 DIAGNOSIS — E119 Type 2 diabetes mellitus without complications: Secondary | ICD-10-CM | POA: Diagnosis not present

## 2017-04-19 DIAGNOSIS — Z01818 Encounter for other preprocedural examination: Secondary | ICD-10-CM | POA: Insufficient documentation

## 2017-04-19 HISTORY — DX: Type 2 diabetes mellitus without complications: E11.9

## 2017-04-19 HISTORY — DX: Anemia, unspecified: D64.9

## 2017-04-19 HISTORY — DX: Cardiac arrhythmia, unspecified: I49.9

## 2017-04-19 HISTORY — DX: Family history of other specified conditions: Z84.89

## 2017-04-19 HISTORY — DX: Malignant (primary) neoplasm, unspecified: C80.1

## 2017-04-19 LAB — SURGICAL PCR SCREEN
MRSA, PCR: NEGATIVE
STAPHYLOCOCCUS AUREUS: NEGATIVE

## 2017-04-19 LAB — CBC
HEMATOCRIT: 38.4 % (ref 36.0–46.0)
HEMOGLOBIN: 12.8 g/dL (ref 12.0–15.0)
MCH: 33.1 pg (ref 26.0–34.0)
MCHC: 33.3 g/dL (ref 30.0–36.0)
MCV: 99.2 fL (ref 78.0–100.0)
Platelets: 302 10*3/uL (ref 150–400)
RBC: 3.87 MIL/uL (ref 3.87–5.11)
RDW: 13.3 % (ref 11.5–15.5)
WBC: 11.3 10*3/uL — ABNORMAL HIGH (ref 4.0–10.5)

## 2017-04-19 LAB — BASIC METABOLIC PANEL
Anion gap: 9 (ref 5–15)
BUN: 15 mg/dL (ref 6–20)
CHLORIDE: 103 mmol/L (ref 101–111)
CO2: 25 mmol/L (ref 22–32)
CREATININE: 0.74 mg/dL (ref 0.44–1.00)
Calcium: 8.9 mg/dL (ref 8.9–10.3)
GFR calc non Af Amer: >60 mL/min (ref >60)
GLUCOSE: 118 mg/dL — AB (ref 65–99)
Potassium: 3.5 mmol/L (ref 3.5–5.1)
Sodium: 137 mmol/L (ref 135–145)

## 2017-04-19 LAB — GLUCOSE, CAPILLARY: Glucose-Capillary: 137 mg/dL — ABNORMAL HIGH (ref 65–99)

## 2017-04-19 LAB — HEMOGLOBIN A1C
HEMOGLOBIN A1C: 5.8 % — AB (ref 4.8–5.6)
MEAN PLASMA GLUCOSE: 119.76 mg/dL

## 2017-04-19 LAB — TYPE AND SCREEN
ABO/RH(D): A NEG
ANTIBODY SCREEN: NEGATIVE

## 2017-04-19 NOTE — Progress Notes (Addendum)
PCP is Dr. Sherrie Sport, [place] [phone] Pulmonary is Dr Chase Caller. Cardio is Dr. Percival Spanish  LOV 11/2016.  Stress test done in 2013.  Echo done 09/2016. With follow up as needed. Type 2 diabetic, since 2013.  Thinks A1C done months ago.  Sugar checks run 53-200.  She states that when her blood sugar drops below 80, she will get shakey, sweats. She was telling me that back in 2016, she thought she was having a stroke.  Was brought in, 'stopped breathing,turned blue" and they said she was depleted of fluids. ? Most recently, she's had issue with slurring of speak, can't see and lightheadedness.  "Blackout periods" Approx 6 mths ago, her bp was 150/100, her PCP increased her BP meds, now she seems to be having issues with pressure going to low 89/53. Had Durel Salts NP interview patient.  Info from [facility] requested.

## 2017-04-20 ENCOUNTER — Encounter (HOSPITAL_COMMUNITY): Payer: Self-pay

## 2017-04-20 NOTE — Progress Notes (Signed)
Anesthesia Consult:  Pt is a 66 year old female scheduled for C5-T1 posterior cervical fusion with lateral mass fixation with AIRO, revision of prior instrumentation on 04/24/2017 with Erline Levine, MD  - PCP is Brendolyn Patty, MD - Pulmonologist is Brand Males, MD - Used to see cardiologist Minus Breeding, MD for stress induced cardiomyopathy during acute illness (pyelonephritis) with EF 25% 06/2010 with repeat echo 11/2010 showing EF up to 60%.  Last office visit 04/04/12, prn f/u recommended.   PMH includes: Cardiomyopathy (EF recovered to 60% by 12/06/10 echo (found in media tab)), dysrhythmia (not specified), HTN, heart murmur, asthma, dyslipidemia, DM, anemia, post-op N/V, GERD. Never smoker. BMI 30  Medications include: ASA 81mg , symbicort, diltiazem, nexium, levalbuterol, losartan-hctz, metformin, simvastatin  BP (!) 148/74   Pulse 92   Temp 36.8 C (Oral)   Resp 18   Ht 5\' 5"  (1.651 m)   Wt 181 lb (82.1 kg)   SpO2 100%   BMI 30.12 kg/m   Preoperative labs reviewed.  HbA1c 5.8, glucose 118  CT chest 05/04/16:  - No acute findings within the thorax. - Stable 3 mm left lower lobe pulmonary nodule, consistent with benign etiology. Other tiny benign calcified granulomas are also noted. No continued imaging follow-up necessary. - Aortic atherosclerosis.  EKG 04/19/17: NSR. Minimal voltage criteria for LVH, may be normal variant  Echo 09/28/16:  - Left ventricle: The cavity size was normal. Wall thickness was normal. Systolic function was normal. The estimated ejection fraction was in the range of 60% to 65%. Wall motion was normal; there were no regional wall motion abnormalities. Left ventricular diastolic function parameters were normal. - Aortic valve: Mildly calcified annulus. Trileaflet. - Mitral valve: There was trivial regurgitation. - Right atrium: Central venous pressure (est): 3 mm Hg. - Atrial septum: No defect or patent foramen ovale was identified. - Tricuspid valve:  There was trivial regurgitation. - Pulmonary arteries: PA peak pressure: 28 mm Hg (S). - Pericardium, extracardiac: There was no pericardial effusion. - Impressions: Normal LV wall thickness with LVEF 60-65% and grossly normal diastolic function. Trivial mitral regurgitation. Mildly sclerotic aortic annulus. Trivial tricuspid regurgitation with estimated PASP normal at 28 mmHg.  Nuclear stress test 04/01/12:  - Normal stress nuclear study. - LV Ejection Fraction: 65%.  LV Wall Motion:  NL LV Function; NL Wall Motion  I saw pt in pre-admission testing.  She reports hypotensive episodes since PCP increased her losartan-hctz.  She has reduced her dose by taking only half of her losartan-hctz, and symptoms have resolved.  Pt can check BP at home. I recommended she keep track of her BP and take to PCP to discuss what treatment is best for her.    Pt also reported a similar episode in 2017 while on vacation in [place].  Records from [facility] indicate pt was seen in the ER 01/02/16 for syncope and dehydration.  Pt was hypotensive initially (BP not documented), but recovered completely after 2.5L IVF and discharged from ER.    If no changes, I anticipate pt can proceed with surgery as scheduled.   Willeen Cass, FNP-BC [facility] Short Stay Surgical Center/Anesthesiology Phone: [phone] 04/20/2017 1:02 PM

## 2017-04-24 ENCOUNTER — Inpatient Hospital Stay (HOSPITAL_COMMUNITY): Admission: RE | Admit: 2017-04-24 | Payer: Medicare Other | Source: Ambulatory Visit | Admitting: Neurosurgery

## 2017-04-24 ENCOUNTER — Encounter (HOSPITAL_COMMUNITY): Admission: RE | Payer: Self-pay | Source: Ambulatory Visit

## 2017-04-24 SURGERY — POSTERIOR CERVICAL FUSION/FORAMINOTOMY LEVEL 3
Anesthesia: General

## 2017-04-26 ENCOUNTER — Other Ambulatory Visit: Payer: Self-pay | Admitting: Neurosurgery

## 2017-05-03 ENCOUNTER — Telehealth: Payer: Self-pay | Admitting: Registered Nurse

## 2017-05-03 ENCOUNTER — Encounter: Payer: Self-pay | Admitting: Registered Nurse

## 2017-05-03 ENCOUNTER — Encounter: Payer: Medicare Other | Attending: Physical Medicine & Rehabilitation | Admitting: Registered Nurse

## 2017-05-03 ENCOUNTER — Other Ambulatory Visit: Payer: Self-pay

## 2017-05-03 VITALS — BP 151/88 | HR 100

## 2017-05-03 DIAGNOSIS — M5416 Radiculopathy, lumbar region: Secondary | ICD-10-CM

## 2017-05-03 DIAGNOSIS — M5441 Lumbago with sciatica, right side: Secondary | ICD-10-CM | POA: Insufficient documentation

## 2017-05-03 DIAGNOSIS — M5412 Radiculopathy, cervical region: Secondary | ICD-10-CM | POA: Diagnosis not present

## 2017-05-03 DIAGNOSIS — G894 Chronic pain syndrome: Secondary | ICD-10-CM | POA: Diagnosis not present

## 2017-05-03 DIAGNOSIS — G039 Meningitis, unspecified: Secondary | ICD-10-CM

## 2017-05-03 DIAGNOSIS — Z5181 Encounter for therapeutic drug level monitoring: Secondary | ICD-10-CM

## 2017-05-03 DIAGNOSIS — Z79899 Other long term (current) drug therapy: Secondary | ICD-10-CM

## 2017-05-03 DIAGNOSIS — M961 Postlaminectomy syndrome, not elsewhere classified: Secondary | ICD-10-CM | POA: Diagnosis not present

## 2017-05-03 MED ORDER — OXYCODONE HCL 15 MG PO TABS: 15.0000 mg | ORAL_TABLET | Freq: Four times a day (QID) | ORAL | 0 refills | Status: DC | PRN

## 2017-05-03 MED ORDER — OXYMORPHONE HCL ER 40 MG PO T12A: 40.0000 mg | EXTENDED_RELEASE_TABLET | Freq: Two times a day (BID) | ORAL | 0 refills | Status: DC

## 2017-05-03 NOTE — Progress Notes (Signed)
Subjective:    Patient ID: [name] [name], female    DOB: 01/09/1951, 66 y.o.   MRN: [id number]  HPI:  [name] [name] is a 66year old female who returns for follow up appointmentfor chronic pain and medication refill. She states her pain is located in her neck radiating into her bilateral upper extremities L>R and mid- lower back radiating into her right lower extremity.She rates her pain 8. Her current exercise regime is walking.  [name] [name] equivalent is 341.00 MME. She is also prescribed Diazepam by Dr. Amanda Pea. We have discussed the black box warning of using opioids and benzodiazepines. I highlighted the dangers of using these drugs together and discussed the adverse events including respiratory suppression, overdose, cognitive impairment and importance of  compliance with current regimen. She verbalizes understanding, we will continue to monitor and adjust as indicated.    [name] [name] is scheduled for surgery on 05/17/2017: Dr. Vertell Limber will be forming C5C6C7 T1 Posterior Cervical Fusion with lateral mass fixation with AIRO Imaging, revision of prior instrumentation.   Last UDS was performed on 12/05/2016, it was consistent.   Pain Inventory Average Pain 8 Pain Right Now 8 My pain is sharp, burning, stabbing and tingling  In the last 24 hours, has pain interfered with the following? General activity 7 Relation with others 7 Enjoyment of life 7 What TIME of day is your pain at its worst? all Sleep (in general) Fair  Pain is worse with: walking, sitting, standing and some activites Pain improves with: rest, heat/ice and medication Relief from Meds: 7  Mobility walk without assistance ability to climb steps?  yes do you drive?  yes  Function Do you have any goals in this area?  no  Neuro/Psych No problems in this area  Prior Studies Any changes since last visit?  no  Physicians involved in your care Any changes since last visit?  no   Family History    Problem Relation Age of Onset  . Hypertension Mother   . Heart disease Mother   . Diabetes Mother   . Cancer Father   . Diabetes Brother    Social History   Socioeconomic History  . Marital status: Divorced    Spouse name: Not on file  . Number of children: Not on file  . Years of education: Not on file  . Highest education level: Not on file  Social Needs  . Financial resource strain: Not on file  . Food insecurity - worry: Not on file  . Food insecurity - inability: Not on file  . Transportation needs - medical: Not on file  . Transportation needs - non-medical: Not on file  Occupational History  . Occupation: disable    Employer: DISABLED  Tobacco Use  . Smoking status: Never Smoker  . Smokeless tobacco: Never Used  Substance and Sexual Activity  . Alcohol use: No  . Drug use: No  . Sexual activity: Not on file    Comment: second hand smoke  Other Topics Concern  . Not on file  Social History Narrative   Has one child   Disabled   Past Surgical History:  Procedure Laterality Date  . ABDOMINAL HYSTERECTOMY  1987   W/ BSO  . ANTERIOR CERVICAL DECOMP/DISCECTOMY FUSION N/A 08/09/2012   Procedure: ANTERIOR CERVICAL DECOMPRESSION/DISCECTOMY FUSION 1 LEVEL;  Surgeon: Floyce Stakes, MD;  Location: [facility];  Service: Neurosurgery;  Laterality: N/A;  Cervical four-five  Anterior cervical decompression/diskectomy/fusion  . ANTERIOR FUSION CERVICAL  SPINE  2007   C5 -6  . APPENDECTOMY    . BACK SURGERY     x 5  . BREAST SURGERY     x 2 biopsies  . CARPAL TUNNEL RELEASE  RIGHT - 2001  & DEC 2011 W/ BACK SURG.  . CERVICAL DISC SURGERY  2005   C5 - 6  . CHOLECYSTECTOMY  1994  . DORSAL COMPARTMENT RELEASE Right 04/07/2013   Procedure: RIGHT WRIST STS RELEASE;  Surgeon: Schuyler Amor, MD;  Location: [facility];  Service: Orthopedics;  Laterality: Right;  . EXPLORATION OF INCISION FOR CSF LEAK  DEC 2011  X2   POST LAMINECOTMY  . FINGER  ARTHRODESIS Right 04/07/2013   Procedure: RIGHT INDEX AND RIGHT LONG DISTAL INTERPHALANGEAL JOINT FUSIONS;  Surgeon: Schuyler Amor, MD;  Location: [place];  Service: Orthopedics;  Laterality: Right;  . FINGER ARTHROPLASTY Right 04/07/2013   Procedure: RIGHT THUMB [place] ARTHROPLASTY;  Surgeon: Schuyler Amor, MD;  Location: [place];  Service: Orthopedics;  Laterality: Right;  . GANGLION CYST EXCISION Right 04/07/2013   Procedure: RIGHT WRIST MASS EXCISION;  Surgeon: Schuyler Amor, MD;  Location: [place];  Service: Orthopedics;  Laterality: Right;  . KNEE ARTHROSCOPY  LEFT X3 (LAST ONE 2005)  . KNEE ARTHROSCOPY  05/17/2011   Procedure: ARTHROSCOPY KNEE;  Surgeon: Bradley Ferris III;  Location: [facility];  Service: Orthopedics;  Laterality: Right;  WITH MEDIAL MENISECTOMY AND removal of suprapatella fat lump  . LEFT WRIST TENOSYNECTOMY W/ LEFT THUMB JOINT REPAIR  02-24-10  . LUMBAR FUSION  11-30-10   L4 - 5  . LUMBAR FUSION  2000   L2 - 4  . LUMBAR LAMINECTOMY  DEC 2011   L4 - 5  . TENDON REPAIR  JAN 2010   LEFT INDEX AND LONG FINGERS  . TUBAL LIGATION     Past Medical History:  Diagnosis Date  . Acute sinusitis, unspecified   . Anemia    "quite a few times"  . Anxiety state, unspecified   . Arachnoiditis BILATERAL LEGS   DUE TO MULTIPLE BACK SURG.'S  . Arthritis   . Asthma    last flare up was 03/2017 lasted over a month  . Blood transfusion   . Cancer ([facility])    skin cancers (in scalp)  . Cardiomyopathy HX --06/2010   EF was 25% during acute illness (PHELONEPHRITIS) Repeat echo 12-06-10 60% showed normal EF.   Marland Kitchen Chronic back pain greater than 3 months duration    S/P BACK SURG'S  . Diabetes mellitus without complication ([place])    dx 2013 type 2  . Dyslipidemia   . Dysphagia    some post op cerv fusion 2/14  . Dysrhythmia   . Essential hypertension, benign   . Family history of adverse reaction  to anesthesia    mother gets n/v  . GERD (gastroesophageal reflux disease) AND HIATIAL HERNIA   CONTROLLED W/ NEXIUM  . Headache(784.0)   . Heart murmur    DENIES S & S   (ECHO JUN'12 W/ CHART)  . History of chronic bronchitis   . Hx of bladder infections   . Hypertension   . Murmur, heart   . Neuromuscular disorder (HCC)    numbness and tingling  . Osteoporosis   . Other malaise and fatigue   . PONV (postoperative nausea and vomiting)   . Shortness of breath   . Varicosities  venous  . Weakness of both legs DUE TO ARACHNOIDITIS   OCCASIONAL USES CANE   There were no vitals taken for this visit.  Opioid Risk Score:  2 Fall Risk Score:  `1  Depression screen PHQ 2/9  Depression screen [facility] 2/9 03/05/2017 01/05/2017 01/20/2016 12/20/2015 07/05/2015 03/08/2015 01/06/2015  Decreased Interest 0 0 0 0 1 1 1   Down, Depressed, Hopeless 0 0 0 0 1 1 1   PHQ - 2 Score 0 0 0 0 2 2 2   Altered sleeping - - - - - - -  Tired, decreased energy - - - - - - -  Change in appetite - - - - - - -  Feeling bad or failure about yourself  - - - - - - -  Trouble concentrating - - - - - - -  Moving slowly or fidgety/restless - - - - - - -  Suicidal thoughts - - - - - - -  PHQ-9 Score - - - - - - -  Some recent data might be hidden     Review of Systems  Constitutional: Negative.   HENT: Negative.   Eyes: Negative.   Respiratory: Negative.   Cardiovascular: Negative.   Gastrointestinal: Negative.   Endocrine: Negative.   Genitourinary: Negative.   Musculoskeletal: Negative.   Skin: Negative.   Allergic/Immunologic: Negative.   Neurological: Negative.   Hematological: Negative.   Psychiatric/Behavioral: Negative.   All other systems reviewed and are negative.      Objective:   Physical Exam  Constitutional: She is oriented to person, place, and time. She appears well-developed and well-nourished.  HENT:  Head: Normocephalic and atraumatic.  Neck: Normal range of motion. Neck supple.    Cervical Paraspinal Tenderness: C-5-C-6  Cardiovascular: Normal rate and regular rhythm.  Pulmonary/Chest: Effort normal and breath sounds normal.  Musculoskeletal:  Normal Muscle Bulk and Muscle Testing Reveals: Upper Extremities: Decreased ROM 90 Degrees and Muscle Strength on the Right 4/5 and Left 3/5 Bilateral  AC Joint Tenderness Thoracic hypersensitivity: T-1-T-7 Lumbar Hypersensitivity Lower Extremities: Full ROM and Muscle Strength 5/5 Arises from Table with ease using straight cane for support Narrow Based Gait  Neurological: She is alert and oriented to person, place, and time.  Skin: Skin is warm and dry.  Psychiatric: She has a normal mood and affect.  Nursing note and vitals reviewed.         Assessment & Plan:  1. 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 Continue Gabapentin. 05/03/2017 Refilled:Oxycodone 15mg  one tablet every 6 hours as needed #120 and  Oxymorphone HCL 40 mg every 12 hours #60. We will continue the opioid monitoring program, this consists of regular clinic visits, examinations, urine drug screen, pill counts as well as use of [place] Controlled Substance Reporting System. 2. 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 Encourage Posture and Stretching Exercise. 05/03/2017 3. Anxiety/depression: Continue Valium, PCP Following. Continue  To monitor. 05/03/2017 4. Muscle Spasms: Continue Flexeril. Continue to Monitor. 05/03/2017 5. Neck Pain/ Radiculopathy: Continue Gabapentin:  Dr. Vertell Limber Following: Surgery Planned on 05/17/2017: .Dr. Vertell Limber will be forming C5C6C7 T1 Posterior Cervical Fusion with lateral mass fixation with AIRO Imaging, revision of prior instrumentation: 05/03/2017  20 minutes of face to face patient care time was spent during this visit. All questions were encouraged and answered.  F/U in 1 month

## 2017-05-03 NOTE — Telephone Encounter (Signed)
On 05/03/2017 the [name] [name] was reviewed no conflict was seen on the [name] [name] with multiple prescribers. [name] [name]  has a signed narcotic contract with our office. If there were any discrepancies this would have been reported to her physician.

## 2017-05-15 DIAGNOSIS — I10 Essential (primary) hypertension: Secondary | ICD-10-CM | POA: Diagnosis not present

## 2017-05-15 DIAGNOSIS — J441 Chronic obstructive pulmonary disease with (acute) exacerbation: Secondary | ICD-10-CM | POA: Diagnosis not present

## 2017-05-15 DIAGNOSIS — E1165 Type 2 diabetes mellitus with hyperglycemia: Secondary | ICD-10-CM | POA: Diagnosis not present

## 2017-05-16 ENCOUNTER — Encounter (HOSPITAL_COMMUNITY): Payer: Self-pay | Admitting: *Deleted

## 2017-05-16 ENCOUNTER — Other Ambulatory Visit: Payer: Self-pay

## 2017-05-16 NOTE — Progress Notes (Signed)
Pt denies SOB, chest pain, and being under the care of a cardiologist. Pt denies having a cardiac cath. Pt denies having a chest x ray within the last year. Pt made aware to stop taking  Aspirin, vitamins, fish oil and herbal medications. Do not take any NSAIDs ie: Ibuprofen, Advil, Naproxen (Aleve), Motrin, BC and Goody Powder or any medication containing Aspirin. Pt made aware to bring inhalers in on DOS. Pt made aware to not take Metformin DOS. Pt made aware to check BG every 2 hours prior to arrival to hospital on DOS. Pt made aware to treat a BG < 70 with 4 glucose tabs or  4 ounces of cranberry juice, wait 15 minutes after intervention to recheck BG, if BG remains < 70, call Short Stay unit to speak with a nurse. Pt verbalized understanding of all pre-op instructions.

## 2017-05-17 ENCOUNTER — Inpatient Hospital Stay (HOSPITAL_COMMUNITY): Payer: Medicare Other

## 2017-05-17 ENCOUNTER — Encounter (HOSPITAL_COMMUNITY)
Admission: RE | Disposition: A | Discharge status: Home / Self Care | Payer: Self-pay | Source: Ambulatory Visit | Attending: Neurosurgery

## 2017-05-17 ENCOUNTER — Encounter (HOSPITAL_COMMUNITY): Payer: Self-pay | Admitting: *Deleted

## 2017-05-17 ENCOUNTER — Inpatient Hospital Stay (HOSPITAL_COMMUNITY)
Admission: RE | Admit: 2017-05-17 | Discharge: 2017-05-18 | DRG: 473 | Disposition: A | Discharge status: Home / Self Care | Payer: Medicare Other | Source: Ambulatory Visit | Attending: Neurosurgery | Admitting: Neurosurgery

## 2017-05-17 DIAGNOSIS — T84226A Displacement of internal fixation device of vertebrae, initial encounter: Secondary | ICD-10-CM | POA: Diagnosis not present

## 2017-05-17 DIAGNOSIS — Z886 Allergy status to analgesic agent status: Secondary | ICD-10-CM | POA: Diagnosis not present

## 2017-05-17 DIAGNOSIS — J45909 Unspecified asthma, uncomplicated: Secondary | ICD-10-CM | POA: Diagnosis present

## 2017-05-17 DIAGNOSIS — M4804 Spinal stenosis, thoracic region: Secondary | ICD-10-CM | POA: Diagnosis present

## 2017-05-17 DIAGNOSIS — Z7983 Long term (current) use of bisphosphonates: Secondary | ICD-10-CM | POA: Diagnosis not present

## 2017-05-17 DIAGNOSIS — K219 Gastro-esophageal reflux disease without esophagitis: Secondary | ICD-10-CM | POA: Diagnosis present

## 2017-05-17 DIAGNOSIS — I10 Essential (primary) hypertension: Secondary | ICD-10-CM | POA: Diagnosis present

## 2017-05-17 DIAGNOSIS — Z885 Allergy status to narcotic agent status: Secondary | ICD-10-CM | POA: Diagnosis not present

## 2017-05-17 DIAGNOSIS — E119 Type 2 diabetes mellitus without complications: Secondary | ICD-10-CM | POA: Diagnosis present

## 2017-05-17 DIAGNOSIS — M549 Dorsalgia, unspecified: Secondary | ICD-10-CM | POA: Diagnosis not present

## 2017-05-17 DIAGNOSIS — Z79891 Long term (current) use of opiate analgesic: Secondary | ICD-10-CM | POA: Diagnosis not present

## 2017-05-17 DIAGNOSIS — Z91040 Latex allergy status: Secondary | ICD-10-CM

## 2017-05-17 DIAGNOSIS — I341 Nonrheumatic mitral (valve) prolapse: Secondary | ICD-10-CM | POA: Diagnosis present

## 2017-05-17 DIAGNOSIS — T84028A Dislocation of other internal joint prosthesis, initial encounter: Principal | ICD-10-CM | POA: Diagnosis present

## 2017-05-17 DIAGNOSIS — Z7984 Long term (current) use of oral hypoglycemic drugs: Secondary | ICD-10-CM | POA: Diagnosis not present

## 2017-05-17 DIAGNOSIS — Z881 Allergy status to other antibiotic agents status: Secondary | ICD-10-CM

## 2017-05-17 DIAGNOSIS — M961 Postlaminectomy syndrome, not elsewhere classified: Secondary | ICD-10-CM | POA: Diagnosis not present

## 2017-05-17 DIAGNOSIS — M5012 Mid-cervical disc disorder, unspecified level: Secondary | ICD-10-CM | POA: Diagnosis not present

## 2017-05-17 DIAGNOSIS — M4802 Spinal stenosis, cervical region: Secondary | ICD-10-CM | POA: Diagnosis present

## 2017-05-17 HISTORY — DX: Cerebrospinal fluid leak, unspecified: G96.00

## 2017-05-17 HISTORY — PX: APPLICATION OF INTRAOPERATIVE CT SCAN: SHX6668

## 2017-05-17 HISTORY — DX: Other reaction to spinal and lumbar puncture: G97.1

## 2017-05-17 HISTORY — DX: Cerebrospinal fluid leak: G96.0

## 2017-05-17 HISTORY — PX: POSTERIOR CERVICAL FUSION/FORAMINOTOMY: SHX5038

## 2017-05-17 HISTORY — DX: Spinal stenosis, cervical region: M48.02

## 2017-05-17 LAB — GLUCOSE, CAPILLARY
GLUCOSE-CAPILLARY: 115 mg/dL — AB (ref 65–99)
GLUCOSE-CAPILLARY: 220 mg/dL — AB (ref 65–99)
Glucose-Capillary: 187 mg/dL — ABNORMAL HIGH (ref 65–99)
Glucose-Capillary: 178 mg/dL — ABNORMAL HIGH (ref 65–99)

## 2017-05-17 LAB — TYPE AND SCREEN
ABO/RH(D): A NEG
Antibody Screen: NEGATIVE

## 2017-05-17 LAB — CBC
HEMATOCRIT: 35.2 % — AB (ref 36.0–46.0)
Hemoglobin: 11.6 g/dL — ABNORMAL LOW (ref 12.0–15.0)
MCH: 32.7 pg (ref 26.0–34.0)
MCHC: 33 g/dL (ref 30.0–36.0)
MCV: 99.2 fL (ref 78.0–100.0)
PLATELETS: 274 10*3/uL (ref 150–400)
RBC: 3.55 MIL/uL — AB (ref 3.87–5.11)
RDW: 12.8 % (ref 11.5–15.5)
WBC: 9.7 10*3/uL (ref 4.0–10.5)

## 2017-05-17 LAB — BASIC METABOLIC PANEL
Anion gap: 8 (ref 5–15)
BUN: 13 mg/dL (ref 6–20)
CHLORIDE: 102 mmol/L (ref 101–111)
CO2: 27 mmol/L (ref 22–32)
CREATININE: 0.61 mg/dL (ref 0.44–1.00)
Calcium: 8.3 mg/dL — ABNORMAL LOW (ref 8.9–10.3)
GFR calc non Af Amer: >60 mL/min (ref >60)
Glucose, Bld: 113 mg/dL — ABNORMAL HIGH (ref 65–99)
POTASSIUM: 3.6 mmol/L (ref 3.5–5.1)
SODIUM: 137 mmol/L (ref 135–145)

## 2017-05-17 SURGERY — POSTERIOR CERVICAL FUSION/FORAMINOTOMY LEVEL 3
Anesthesia: General | Site: Neck

## 2017-05-17 MED ORDER — SCOPOLAMINE 1 MG/3DAYS TD PT72
MEDICATED_PATCH | TRANSDERMAL | Status: AC
  Filled 2017-05-17: qty 1

## 2017-05-17 MED ORDER — ALBUTEROL SULFATE (2.5 MG/3ML) 0.083% IN NEBU
2.5000 mg | INHALATION_SOLUTION | RESPIRATORY_TRACT | Status: DC | PRN
  Filled 2017-05-17: qty 3

## 2017-05-17 MED ORDER — DIAZEPAM 5 MG PO TABS: 10.0000 mg | ORAL_TABLET | Freq: Every day | ORAL | Status: DC | PRN

## 2017-05-17 MED ORDER — PANTOPRAZOLE SODIUM 40 MG IV SOLR: 40.0000 mg | Freq: Every day | INTRAVENOUS | Status: DC

## 2017-05-17 MED ORDER — LOSARTAN POTASSIUM 50 MG PO TABS
100.0000 mg | ORAL_TABLET | Freq: Every day | ORAL | Status: DC
  Administered 2017-05-18: 100 mg via ORAL
  Filled 2017-05-17: qty 2

## 2017-05-17 MED ORDER — BACITRACIN ZINC 500 UNIT/GM EX OINT
TOPICAL_OINTMENT | CUTANEOUS | Status: DC | PRN
  Administered 2017-05-17: 1 via TOPICAL

## 2017-05-17 MED ORDER — GABAPENTIN 600 MG PO TABS
600.0000 mg | ORAL_TABLET | Freq: Four times a day (QID) | ORAL | Status: DC
  Administered 2017-05-17 – 2017-05-18 (×3): 600 mg via ORAL
  Filled 2017-05-17 (×3): qty 1

## 2017-05-17 MED ORDER — ONDANSETRON HCL 4 MG/2ML IJ SOLN: 4.0000 mg | Freq: Once | INTRAMUSCULAR | Status: DC | PRN

## 2017-05-17 MED ORDER — BACITRACIN ZINC 500 UNIT/GM EX OINT
TOPICAL_OINTMENT | CUTANEOUS | Status: AC
  Filled 2017-05-17: qty 28.35

## 2017-05-17 MED ORDER — PROPOFOL 10 MG/ML IV BOLUS
INTRAVENOUS | Status: AC
  Filled 2017-05-17: qty 20

## 2017-05-17 MED ORDER — HYDROMORPHONE HCL 1 MG/ML IJ SOLN
INTRAMUSCULAR | Status: AC
  Administered 2017-05-17: 0.5 mg via INTRAVENOUS
  Filled 2017-05-17: qty 1

## 2017-05-17 MED ORDER — MIDAZOLAM HCL 2 MG/2ML IJ SOLN
INTRAMUSCULAR | Status: AC
  Filled 2017-05-17: qty 2

## 2017-05-17 MED ORDER — METHOCARBAMOL 500 MG PO TABS
500.0000 mg | ORAL_TABLET | Freq: Four times a day (QID) | ORAL | Status: DC | PRN
  Administered 2017-05-18: 500 mg via ORAL
  Filled 2017-05-17: qty 1

## 2017-05-17 MED ORDER — OXYCODONE HCL 5 MG PO TABS
ORAL_TABLET | ORAL | Status: AC
  Administered 2017-05-17: 15 mg via ORAL
  Filled 2017-05-17: qty 3

## 2017-05-17 MED ORDER — FENTANYL CITRATE (PF) 250 MCG/5ML IJ SOLN
INTRAMUSCULAR | Status: AC
Start: 2017-05-17 — End: 2017-05-17
  Filled 2017-05-17: qty 5

## 2017-05-17 MED ORDER — FLEET ENEMA 7-19 GM/118ML RE ENEM: 1.0000 | ENEMA | Freq: Once | RECTAL | Status: DC | PRN

## 2017-05-17 MED ORDER — PHENYLEPHRINE 40 MCG/ML (10ML) SYRINGE FOR IV PUSH (FOR BLOOD PRESSURE SUPPORT)
PREFILLED_SYRINGE | INTRAVENOUS | Status: DC | PRN
  Administered 2017-05-17: 40 ug via INTRAVENOUS
  Administered 2017-05-17: 80 ug via INTRAVENOUS
  Administered 2017-05-17: 40 ug via INTRAVENOUS

## 2017-05-17 MED ORDER — SUGAMMADEX SODIUM 200 MG/2ML IV SOLN
INTRAVENOUS | Status: DC | PRN
  Administered 2017-05-17: 200 mg via INTRAVENOUS

## 2017-05-17 MED ORDER — SODIUM CHLORIDE 0.9% FLUSH: 3.0000 mL | Freq: Two times a day (BID) | INTRAVENOUS | Status: DC

## 2017-05-17 MED ORDER — MIDAZOLAM HCL 5 MG/5ML IJ SOLN
INTRAMUSCULAR | Status: DC | PRN
  Administered 2017-05-17: 2 mg via INTRAVENOUS

## 2017-05-17 MED ORDER — ONDANSETRON HCL 4 MG/2ML IJ SOLN: 4.0000 mg | Freq: Four times a day (QID) | INTRAMUSCULAR | Status: DC | PRN

## 2017-05-17 MED ORDER — DEXAMETHASONE SODIUM PHOSPHATE 10 MG/ML IJ SOLN
INTRAMUSCULAR | Status: AC
  Filled 2017-05-17: qty 1

## 2017-05-17 MED ORDER — POLYETHYLENE GLYCOL 3350 17 G PO PACK: 17.0000 g | PACK | Freq: Every day | ORAL | Status: DC | PRN

## 2017-05-17 MED ORDER — MORPHINE SULFATE (PF) 4 MG/ML IV SOLN: 2.0000 mg | INTRAVENOUS | Status: DC | PRN

## 2017-05-17 MED ORDER — NALOXONE HCL 4 MG/0.1ML NA LIQD: 1.0000 | Freq: Once | NASAL | Status: DC | PRN

## 2017-05-17 MED ORDER — THROMBIN (RECOMBINANT) 5000 UNITS EX SOLR
CUTANEOUS | Status: DC | PRN
  Administered 2017-05-17: 5000 [IU] via TOPICAL

## 2017-05-17 MED ORDER — OXYMORPHONE HCL ER 40 MG PO T12A: 40.0000 mg | EXTENDED_RELEASE_TABLET | Freq: Two times a day (BID) | ORAL | Status: DC

## 2017-05-17 MED ORDER — PHENYLEPHRINE 40 MCG/ML (10ML) SYRINGE FOR IV PUSH (FOR BLOOD PRESSURE SUPPORT)
PREFILLED_SYRINGE | INTRAVENOUS | Status: AC
  Filled 2017-05-17: qty 10

## 2017-05-17 MED ORDER — PHENYLEPHRINE HCL 10 MG/ML IJ SOLN
INTRAVENOUS | Status: DC | PRN
  Administered 2017-05-17: 20 ug/min via INTRAVENOUS

## 2017-05-17 MED ORDER — ACETAMINOPHEN 325 MG PO TABS: 650.0000 mg | ORAL_TABLET | ORAL | Status: DC | PRN

## 2017-05-17 MED ORDER — LIDOCAINE-EPINEPHRINE 1 %-1:100000 IJ SOLN
INTRAMUSCULAR | Status: DC | PRN
  Administered 2017-05-17: 5 mL

## 2017-05-17 MED ORDER — CHLORHEXIDINE GLUCONATE CLOTH 2 % EX PADS: 6.0000 | MEDICATED_PAD | Freq: Once | CUTANEOUS | Status: DC

## 2017-05-17 MED ORDER — LEVALBUTEROL TARTRATE 45 MCG/ACT IN AERO: 1.0000 | INHALATION_SPRAY | RESPIRATORY_TRACT | Status: DC | PRN

## 2017-05-17 MED ORDER — MENTHOL 3 MG MT LOZG: 1.0000 | LOZENGE | OROMUCOSAL | Status: DC | PRN

## 2017-05-17 MED ORDER — OXYCODONE HCL 5 MG PO TABS
15.0000 mg | ORAL_TABLET | Freq: Four times a day (QID) | ORAL | Status: DC | PRN
  Administered 2017-05-17: 15 mg via ORAL

## 2017-05-17 MED ORDER — LIDOCAINE HCL (CARDIAC) 20 MG/ML IV SOLN
INTRAVENOUS | Status: DC | PRN
  Administered 2017-05-17: 100 mg via INTRAVENOUS

## 2017-05-17 MED ORDER — 0.9 % SODIUM CHLORIDE (POUR BTL) OPTIME
TOPICAL | Status: DC | PRN
  Administered 2017-05-17: 1000 mL

## 2017-05-17 MED ORDER — HYDROCHLOROTHIAZIDE 25 MG PO TABS
25.0000 mg | ORAL_TABLET | Freq: Every day | ORAL | Status: DC
  Administered 2017-05-18: 25 mg via ORAL
  Filled 2017-05-17: qty 1

## 2017-05-17 MED ORDER — CEFAZOLIN SODIUM-DEXTROSE 2-4 GM/100ML-% IV SOLN
2.0000 g | Freq: Three times a day (TID) | INTRAVENOUS | Status: AC
  Administered 2017-05-17 (×2): 2 g via INTRAVENOUS
  Filled 2017-05-17 (×2): qty 100

## 2017-05-17 MED ORDER — ONDANSETRON HCL 4 MG/2ML IJ SOLN
INTRAMUSCULAR | Status: DC | PRN
  Administered 2017-05-17: 4 mg via INTRAVENOUS

## 2017-05-17 MED ORDER — ROCURONIUM BROMIDE 10 MG/ML (PF) SYRINGE
PREFILLED_SYRINGE | INTRAVENOUS | Status: AC
  Filled 2017-05-17: qty 5

## 2017-05-17 MED ORDER — VENLAFAXINE HCL ER 75 MG PO CP24
75.0000 mg | ORAL_CAPSULE | Freq: Every day | ORAL | Status: DC
  Administered 2017-05-18: 75 mg via ORAL
  Filled 2017-05-17: qty 1

## 2017-05-17 MED ORDER — ESTRADIOL 2 MG PO TABS
2.0000 mg | ORAL_TABLET | Freq: Every day | ORAL | Status: DC
  Administered 2017-05-18: 2 mg via ORAL
  Filled 2017-05-17: qty 1

## 2017-05-17 MED ORDER — SIMVASTATIN 20 MG PO TABS
10.0000 mg | ORAL_TABLET | Freq: Every day | ORAL | Status: DC
  Administered 2017-05-17: 10 mg via ORAL
  Filled 2017-05-17: qty 1

## 2017-05-17 MED ORDER — PHENOL 1.4 % MT LIQD: 1.0000 | OROMUCOSAL | Status: DC | PRN

## 2017-05-17 MED ORDER — VALACYCLOVIR HCL 500 MG PO TABS
500.0000 mg | ORAL_TABLET | Freq: Every day | ORAL | Status: DC
  Administered 2017-05-17 – 2017-05-18 (×2): 500 mg via ORAL
  Filled 2017-05-17 (×2): qty 1

## 2017-05-17 MED ORDER — ROCURONIUM BROMIDE 100 MG/10ML IV SOLN
INTRAVENOUS | Status: DC | PRN
Start: 2017-05-17 — End: 2017-05-17
  Administered 2017-05-17: 50 mg via INTRAVENOUS
  Administered 2017-05-17 (×3): 10 mg via INTRAVENOUS

## 2017-05-17 MED ORDER — ALENDRONATE SODIUM 70 MG PO TABS: 70.0000 mg | ORAL_TABLET | ORAL | Status: DC

## 2017-05-17 MED ORDER — SODIUM CHLORIDE 0.9% FLUSH: 3.0000 mL | INTRAVENOUS | Status: DC | PRN

## 2017-05-17 MED ORDER — CALCIUM CARBONATE 1250 (500 CA) MG PO TABS
1.0000 | ORAL_TABLET | Freq: Every day | ORAL | Status: DC
  Administered 2017-05-17 – 2017-05-18 (×2): 500 mg via ORAL
  Filled 2017-05-17 (×2): qty 1

## 2017-05-17 MED ORDER — LIDOCAINE-EPINEPHRINE 1 %-1:100000 IJ SOLN
INTRAMUSCULAR | Status: AC
  Filled 2017-05-17: qty 1

## 2017-05-17 MED ORDER — OXYCODONE HCL 5 MG PO TABS
15.0000 mg | ORAL_TABLET | ORAL | Status: DC | PRN
  Administered 2017-05-17 – 2017-05-18 (×4): 15 mg via ORAL
  Filled 2017-05-17 (×4): qty 3

## 2017-05-17 MED ORDER — HEMOSTATIC AGENTS (NO CHARGE) OPTIME
TOPICAL | Status: DC | PRN
  Administered 2017-05-17: 1 via TOPICAL

## 2017-05-17 MED ORDER — SUGAMMADEX SODIUM 200 MG/2ML IV SOLN
INTRAVENOUS | Status: AC
  Filled 2017-05-17: qty 2

## 2017-05-17 MED ORDER — METHOCARBAMOL 1000 MG/10ML IJ SOLN
500.0000 mg | Freq: Four times a day (QID) | INTRAVENOUS | Status: DC | PRN
  Filled 2017-05-17: qty 5

## 2017-05-17 MED ORDER — LOSARTAN POTASSIUM-HCTZ 100-25 MG PO TABS: 1.0000 | ORAL_TABLET | Freq: Every day | ORAL | Status: DC

## 2017-05-17 MED ORDER — FENTANYL CITRATE (PF) 100 MCG/2ML IJ SOLN
INTRAMUSCULAR | Status: DC | PRN
  Administered 2017-05-17: 250 ug via INTRAVENOUS
  Administered 2017-05-17: 50 ug via INTRAVENOUS

## 2017-05-17 MED ORDER — ASPIRIN EC 81 MG PO TBEC
81.0000 mg | DELAYED_RELEASE_TABLET | Freq: Every day | ORAL | Status: DC
  Administered 2017-05-17: 81 mg via ORAL
  Filled 2017-05-17: qty 1

## 2017-05-17 MED ORDER — KETOROLAC TROMETHAMINE 15 MG/ML IJ SOLN
7.5000 mg | Freq: Four times a day (QID) | INTRAMUSCULAR | Status: AC
Start: 2017-05-17 — End: 2017-05-18
  Administered 2017-05-17 – 2017-05-18 (×4): 7.5 mg via INTRAVENOUS
  Filled 2017-05-17 (×3): qty 1

## 2017-05-17 MED ORDER — SODIUM CHLORIDE 0.9 % IV SOLN: 250.0000 mL | INTRAVENOUS | Status: DC

## 2017-05-17 MED ORDER — KETOROLAC TROMETHAMINE 15 MG/ML IJ SOLN
INTRAMUSCULAR | Status: AC
  Filled 2017-05-17: qty 1

## 2017-05-17 MED ORDER — BUPIVACAINE HCL (PF) 0.5 % IJ SOLN
INTRAMUSCULAR | Status: DC | PRN
  Administered 2017-05-17: 5 mL

## 2017-05-17 MED ORDER — HYDROMORPHONE HCL 1 MG/ML IJ SOLN
0.2500 mg | INTRAMUSCULAR | Status: DC | PRN
  Administered 2017-05-17 (×4): 0.5 mg via INTRAVENOUS

## 2017-05-17 MED ORDER — KCL IN DEXTROSE-NACL 20-5-0.45 MEQ/L-%-% IV SOLN
INTRAVENOUS | Status: DC
  Administered 2017-05-17: 14:00:00 via INTRAVENOUS

## 2017-05-17 MED ORDER — BISACODYL 10 MG RE SUPP
10.0000 mg | Freq: Every day | RECTAL | Status: DC | PRN
Start: 2017-05-17 — End: 2017-05-18

## 2017-05-17 MED ORDER — VITAMIN B-12 1000 MCG PO TABS
1000.0000 ug | ORAL_TABLET | Freq: Every day | ORAL | Status: DC
  Administered 2017-05-17 – 2017-05-18 (×2): 1000 ug via ORAL
  Filled 2017-05-17 (×2): qty 1

## 2017-05-17 MED ORDER — OXYCODONE HCL ER 20 MG PO T12A
60.0000 mg | EXTENDED_RELEASE_TABLET | Freq: Two times a day (BID) | ORAL | Status: DC
  Administered 2017-05-17 – 2017-05-18 (×2): 60 mg via ORAL
  Filled 2017-05-17 (×2): qty 3

## 2017-05-17 MED ORDER — ALUM & MAG HYDROXIDE-SIMETH 200-200-20 MG/5ML PO SUSP: 30.0000 mL | Freq: Four times a day (QID) | ORAL | Status: DC | PRN

## 2017-05-17 MED ORDER — OXYCODONE HCL 5 MG PO TABS: 5.0000 mg | ORAL_TABLET | ORAL | Status: DC | PRN

## 2017-05-17 MED ORDER — DEXAMETHASONE SODIUM PHOSPHATE 10 MG/ML IJ SOLN
INTRAMUSCULAR | Status: DC | PRN
  Administered 2017-05-17: 10 mg via INTRAVENOUS

## 2017-05-17 MED ORDER — OXYCODONE HCL 5 MG PO TABS: 10.0000 mg | ORAL_TABLET | ORAL | Status: DC | PRN

## 2017-05-17 MED ORDER — VANCOMYCIN HCL 1000 MG IV SOLR
INTRAVENOUS | Status: AC
  Filled 2017-05-17: qty 1000

## 2017-05-17 MED ORDER — MEPERIDINE HCL 25 MG/ML IJ SOLN: 6.2500 mg | INTRAMUSCULAR | Status: DC | PRN

## 2017-05-17 MED ORDER — ZOLPIDEM TARTRATE 5 MG PO TABS: 5.0000 mg | ORAL_TABLET | Freq: Every evening | ORAL | Status: DC | PRN

## 2017-05-17 MED ORDER — ACETAMINOPHEN 650 MG RE SUPP: 650.0000 mg | RECTAL | Status: DC | PRN

## 2017-05-17 MED ORDER — MOMETASONE FURO-FORMOTEROL FUM 100-5 MCG/ACT IN AERO
2.0000 | INHALATION_SPRAY | Freq: Two times a day (BID) | RESPIRATORY_TRACT | Status: DC
  Filled 2017-05-17: qty 8.8

## 2017-05-17 MED ORDER — MONTELUKAST SODIUM 10 MG PO TABS
10.0000 mg | ORAL_TABLET | Freq: Every day | ORAL | Status: DC
  Administered 2017-05-17: 10 mg via ORAL
  Filled 2017-05-17: qty 1

## 2017-05-17 MED ORDER — CALCIUM CARBONATE-VIT D-MIN 1200-1000 MG-UNIT PO CHEW: 1.0000 | CHEWABLE_TABLET | Freq: Every day | ORAL | Status: DC

## 2017-05-17 MED ORDER — CEFAZOLIN SODIUM-DEXTROSE 2-4 GM/100ML-% IV SOLN
2.0000 g | INTRAVENOUS | Status: AC
  Administered 2017-05-17: 2 g via INTRAVENOUS
  Filled 2017-05-17: qty 100

## 2017-05-17 MED ORDER — LACTATED RINGERS IV SOLN
INTRAVENOUS | Status: DC | PRN
  Administered 2017-05-17 (×2): via INTRAVENOUS

## 2017-05-17 MED ORDER — NORETHINDRONE-ETH ESTRADIOL 1-5 MG-MCG PO TABS: 1.0000 | ORAL_TABLET | Freq: Every day | ORAL | Status: DC

## 2017-05-17 MED ORDER — THROMBIN (RECOMBINANT) 5000 UNITS EX SOLR
CUTANEOUS | Status: AC
  Filled 2017-05-17: qty 10000

## 2017-05-17 MED ORDER — SCOPOLAMINE 1 MG/3DAYS TD PT72
MEDICATED_PATCH | TRANSDERMAL | Status: DC | PRN
  Administered 2017-05-17: 1 via TRANSDERMAL

## 2017-05-17 MED ORDER — DILTIAZEM HCL ER COATED BEADS 120 MG PO CP24
240.0000 mg | ORAL_CAPSULE | Freq: Every day | ORAL | Status: DC
  Administered 2017-05-18: 240 mg via ORAL
  Filled 2017-05-17: qty 2

## 2017-05-17 MED ORDER — DOCUSATE SODIUM 100 MG PO CAPS
100.0000 mg | ORAL_CAPSULE | Freq: Two times a day (BID) | ORAL | Status: DC
  Administered 2017-05-17 – 2017-05-18 (×3): 100 mg via ORAL
  Filled 2017-05-17 (×3): qty 1

## 2017-05-17 MED ORDER — FENTANYL CITRATE (PF) 250 MCG/5ML IJ SOLN
INTRAMUSCULAR | Status: AC
  Filled 2017-05-17: qty 5

## 2017-05-17 MED ORDER — KCL IN DEXTROSE-NACL 20-5-0.45 MEQ/L-%-% IV SOLN
INTRAVENOUS | Status: AC
  Filled 2017-05-17: qty 1000

## 2017-05-17 MED ORDER — PROPOFOL 10 MG/ML IV BOLUS
INTRAVENOUS | Status: DC | PRN
Start: 2017-05-17 — End: 2017-05-17
  Administered 2017-05-17: 100 mg via INTRAVENOUS
  Administered 2017-05-17: 120 mg via INTRAVENOUS

## 2017-05-17 MED ORDER — ARTIFICIAL TEARS OPHTHALMIC OINT
TOPICAL_OINTMENT | OPHTHALMIC | Status: DC | PRN
  Administered 2017-05-17: 1 via OPHTHALMIC

## 2017-05-17 MED ORDER — BUPIVACAINE HCL (PF) 0.5 % IJ SOLN
INTRAMUSCULAR | Status: AC
  Filled 2017-05-17: qty 30

## 2017-05-17 MED ORDER — LIDOCAINE 2% (20 MG/ML) 5 ML SYRINGE
INTRAMUSCULAR | Status: AC
  Filled 2017-05-17: qty 5

## 2017-05-17 MED ORDER — CYCLOBENZAPRINE HCL 10 MG PO TABS
10.0000 mg | ORAL_TABLET | Freq: Two times a day (BID) | ORAL | Status: DC
  Administered 2017-05-17 – 2017-05-18 (×2): 10 mg via ORAL
  Filled 2017-05-17 (×3): qty 1

## 2017-05-17 MED ORDER — PANTOPRAZOLE SODIUM 40 MG PO TBEC: 40.0000 mg | DELAYED_RELEASE_TABLET | Freq: Every day | ORAL | Status: DC

## 2017-05-17 MED ORDER — ONDANSETRON HCL 4 MG/2ML IJ SOLN
INTRAMUSCULAR | Status: AC
  Filled 2017-05-17: qty 2

## 2017-05-17 MED ORDER — METFORMIN HCL 500 MG PO TABS
500.0000 mg | ORAL_TABLET | Freq: Two times a day (BID) | ORAL | Status: DC
  Administered 2017-05-18: 500 mg via ORAL
  Filled 2017-05-17: qty 1

## 2017-05-17 MED ORDER — ONDANSETRON HCL 4 MG PO TABS: 4.0000 mg | ORAL_TABLET | Freq: Four times a day (QID) | ORAL | Status: DC | PRN

## 2017-05-17 SURGICAL SUPPLY — 88 items
BIT DRILL NEURO 2X3.1 SFT TUCH (MISCELLANEOUS) ×1 IMPLANT
BIT DRILL VUEPOINT II (BIT) ×1 IMPLANT
BLADE 10 SAFETY STRL DISP (BLADE) ×6 IMPLANT
BLADE CLIPPER SURG (BLADE) ×3 IMPLANT
BLADE SURG 11 STRL SS (BLADE) IMPLANT
BLADE ULTRA TIP 2M (BLADE) IMPLANT
BUR PRECISION FLUTE 5.0 (BURR) ×3 IMPLANT
CANISTER SUCT 3000ML PPV (MISCELLANEOUS) ×3 IMPLANT
CARTRIDGE OIL MAESTRO DRILL (MISCELLANEOUS) ×1 IMPLANT
CLOSURE WOUND 1/2 X4 (GAUZE/BANDAGES/DRESSINGS) ×1
COVER BACK TABLE 60X90IN (DRAPES) ×6 IMPLANT
DECANTER SPIKE VIAL GLASS SM (MISCELLANEOUS) ×6 IMPLANT
DERMABOND ADVANCED (GAUZE/BANDAGES/DRESSINGS) ×2
DERMABOND ADVANCED .7 DNX12 (GAUZE/BANDAGES/DRESSINGS) ×1 IMPLANT
DIFFUSER DRILL AIR PNEUMATIC (MISCELLANEOUS) ×3 IMPLANT
DRAPE C-ARM 42X72 X-RAY (DRAPES) IMPLANT
DRAPE LAPAROTOMY 100X72 PEDS (DRAPES) ×3 IMPLANT
DRAPE MICROSCOPE LEICA (MISCELLANEOUS) IMPLANT
DRAPE POUCH INSTRU U-SHP 10X18 (DRAPES) ×6 IMPLANT
DRAPE SCAN PATIENT (DRAPES) ×3 IMPLANT
DRAPE SHEET LG 3/4 BI-LAMINATE (DRAPES) ×3 IMPLANT
DRILL BIT VUEPOINT II (BIT) ×2
DRILL NEURO 2X3.1 SOFT TOUCH (MISCELLANEOUS) ×3
DRSG OPSITE POSTOP 4X8 (GAUZE/BANDAGES/DRESSINGS) ×3 IMPLANT
DRSG PAD ABDOMINAL 8X10 ST (GAUZE/BANDAGES/DRESSINGS) IMPLANT
DURAPREP 6ML APPLICATOR 50/CS (WOUND CARE) ×3 IMPLANT
ELECT REM PT RETURN 9FT ADLT (ELECTROSURGICAL) ×3
ELECTRODE REM PT RTRN 9FT ADLT (ELECTROSURGICAL) ×1 IMPLANT
EVACUATOR 1/8 PVC DRAIN (DRAIN) ×3 IMPLANT
GAUZE SPONGE 4X4 12PLY STRL (GAUZE/BANDAGES/DRESSINGS) IMPLANT
GAUZE SPONGE 4X4 16PLY XRAY LF (GAUZE/BANDAGES/DRESSINGS) ×3 IMPLANT
GLOVE BIO SURGEON STRL SZ8 (GLOVE) ×6 IMPLANT
GLOVE BIOGEL PI IND STRL 6.5 (GLOVE) ×2 IMPLANT
GLOVE BIOGEL PI IND STRL 7.5 (GLOVE) ×6 IMPLANT
GLOVE BIOGEL PI IND STRL 8 (GLOVE) ×3 IMPLANT
GLOVE BIOGEL PI IND STRL 8.5 (GLOVE) ×3 IMPLANT
GLOVE BIOGEL PI INDICATOR 6.5 (GLOVE) ×4
GLOVE BIOGEL PI INDICATOR 7.5 (GLOVE) ×12
GLOVE BIOGEL PI INDICATOR 8 (GLOVE) ×6
GLOVE BIOGEL PI INDICATOR 8.5 (GLOVE) ×6
GLOVE ECLIPSE 8.0 STRL XLNG CF (GLOVE) ×6 IMPLANT
GLOVE EXAM NITRILE LRG STRL (GLOVE) IMPLANT
GLOVE EXAM NITRILE XL STR (GLOVE) IMPLANT
GLOVE EXAM NITRILE XS STR PU (GLOVE) IMPLANT
GLOVE SURG SS PI 8.0 STRL IVOR (GLOVE) ×6 IMPLANT
GOWN STRL REUS W/ TWL LRG LVL3 (GOWN DISPOSABLE) IMPLANT
GOWN STRL REUS W/ TWL XL LVL3 (GOWN DISPOSABLE) ×4 IMPLANT
GOWN STRL REUS W/TWL 2XL LVL3 (GOWN DISPOSABLE) ×9 IMPLANT
GOWN STRL REUS W/TWL LRG LVL3 (GOWN DISPOSABLE)
GOWN STRL REUS W/TWL XL LVL3 (GOWN DISPOSABLE) ×8
HEMOSTAT POWDER SURGIFOAM 1G (HEMOSTASIS) ×3 IMPLANT
HEMOSTAT SURGICEL 2X14 (HEMOSTASIS) IMPLANT
KIT BASIN OR (CUSTOM PROCEDURE TRAY) ×3 IMPLANT
KIT INFUSE XX SMALL 0.7CC (Orthopedic Implant) ×3 IMPLANT
KIT ROOM TURNOVER OR (KITS) ×3 IMPLANT
MARKER SKIN DUAL TIP RULER LAB (MISCELLANEOUS) ×3 IMPLANT
MARKER SPHERE PSV REFLC 13MM (MARKER) ×18 IMPLANT
NEEDLE HYPO 18GX1.5 BLUNT FILL (NEEDLE) IMPLANT
NEEDLE HYPO 25X1 1.5 SAFETY (NEEDLE) ×3 IMPLANT
NEEDLE SPNL 22GX3.5 QUINCKE BK (NEEDLE) ×3 IMPLANT
NS IRRIG 1000ML POUR BTL (IV SOLUTION) ×3 IMPLANT
OIL CARTRIDGE MAESTRO DRILL (MISCELLANEOUS) ×3
PACK LAMINECTOMY NEURO (CUSTOM PROCEDURE TRAY) ×3 IMPLANT
PIN MAYFIELD SKULL DISP (PIN) ×3 IMPLANT
PUTTY BONE ATTRAX 5CC STRIP (Putty) ×3 IMPLANT
ROD 120MM (Rod) ×2 IMPLANT
ROD SPNL 120X3.5XNS LF TI (Rod) ×1 IMPLANT
RUBBERBAND STERILE (MISCELLANEOUS) IMPLANT
SCREW 4.0X14MM (Screw) ×4 IMPLANT
SCREW BN 14X4XMA NS SPNE (Screw) ×2 IMPLANT
SCREW MA MM 3.5X14 (Screw) ×6 IMPLANT
SCREW SET THREADED (Screw) ×18 IMPLANT
SCREW VUEPOINT II 4.5X28MM MA (Screw) ×6 IMPLANT
SPONGE INTESTINAL PEANUT (DISPOSABLE) IMPLANT
SPONGE SURGIFOAM ABS GEL SZ50 (HEMOSTASIS) IMPLANT
STAPLER SKIN PROX WIDE 3.9 (STAPLE) ×3 IMPLANT
STRIP CLOSURE SKIN 1/2X4 (GAUZE/BANDAGES/DRESSINGS) ×2 IMPLANT
SUT ETHILON 3 0 FSL (SUTURE) IMPLANT
SUT VIC AB 0 CT1 18XCR BRD8 (SUTURE) ×2 IMPLANT
SUT VIC AB 0 CT1 8-18 (SUTURE) ×4
SUT VIC AB 2-0 CP2 18 (SUTURE) ×3 IMPLANT
SUT VIC AB 3-0 SH 8-18 (SUTURE) ×9 IMPLANT
SYR 3ML LL SCALE MARK (SYRINGE) IMPLANT
TOWEL GREEN STERILE (TOWEL DISPOSABLE) ×3 IMPLANT
TOWEL GREEN STERILE FF (TOWEL DISPOSABLE) ×3 IMPLANT
TRAY FOLEY W/METER SILVER 16FR (SET/KITS/TRAYS/PACK) ×3 IMPLANT
UNDERPAD 30X30 (UNDERPADS AND DIAPERS) IMPLANT
WATER STERILE IRR 1000ML POUR (IV SOLUTION) ×3 IMPLANT

## 2017-05-17 NOTE — H&P (Signed)
Patient ID:   [id number] Patient: [name] [name]  Date of Birth: 05/18/1951 Visit Type: Office Visit   Date: 03/28/2017 01:00 PM Provider: Marchia Meiers. Vertell Limber MD   This 66 year old female presents for pain and back pain.   History of Present Illness: 1.  pain  Patient visits to discuss her upcoming surgery and be fitted for a Vista collar.    2.  back pain       Medical/Surgical/Interim History Reviewed, no change.  Last detailed document date:12/06/2016.     Family History: Reviewed, no changes.    Social History: Reviewed, no changes.   MEDICATIONS(added, continued or stopped this visit): Started Medication Directions Instruction Stopped   Boniva 150mg  *UAD     gabapentin 600 mg tablet take 1 tablet by oral route 3 times every day     metformin 500 mg tablet take 1 tablet by oral route 2 times every day with morning and evening meals     Nexium 40 mg capsule,delayed release take 1 capsule by oral route  every day     Opana ER 40 mg tablet,extended release take 1 tablet by oral route  every 12 hours at least 1 hour before or 2 hours after meals     Premarin 1.25 mg tablet take 1 tablet by oral route  every day for 21 consecutive days, followed by 7 days off     ProAir HFA 90 mcg/actuation aerosol inhaler inhale 2 puff by inhalation route  every 4 - 6 hours as needed     Roxicodone 15 mg tablet take 1 tablet by oral route  every 8 hours     Symbicort 80 mcg-4.5 mcg/actuation HFA aerosol inhaler inhale 2 puff by inhalation route 2 times every day in the morning and evening     Valium 10 mg tablet take 1 tablet by oral route 3 times every day as needed for spasms     Xopenex HFA 45 mcg/actuation aerosol inhaler inhale 2 puff by inhalation route  every 6 hours     Ziac 5 mg-6.25 mg tablet take 1 tablet by oral route  every day       ALLERGIES: Ingredient Reaction Medication Name Comment  CLARITHROMYCIN  Biaxin   OXYCODONE HCL  Percocet   ACETAMINOPHEN  Percocet    ACETAMINOPHEN  Tylenol   LATEX     MORPHINE     ASPIRIN      Reviewed, no changes.    Vitals Date Temp F BP Pulse Ht In Wt Lb BMI BSA Pain Score  03/28/2017  129/82 79 63.5 184.2 32.12  7/10      IMPRESSION [name] [name] visits to discuss her upcoming surgery and be fitted for a Vista collar.  Completed Orders (this encounter) Order Details Reason Side Interpretation Result Initial Treatment Date Region  Hypertension education Patient to follow up with primary care provider.        Dietary management education, guidance, and counseling patient encouraged to eat a well balanced diet         Assessment/Plan # Detail Type Description   1. Assessment Cervicalgia (M54.2).       2. Assessment Essential (primary) hypertension (I10).       3. Assessment Body mass index (BMI) 32.0-32.9, adult (O13.08).   Plan Orders Today's instructions / counseling include(s) Dietary management education, guidance, and counseling.           Pain Management Plan Pain Scale: 7/10. Method: Numeric Pain Intensity Scale. Location: neck. Onset:  06/20/2015. Duration: varies. Quality: discomforting. Pain management follow-up plan of care: Patient taking medication as prescribed..  Using patient's x-rays, MRI, and surgical models, Dr. Vertell Limber so surgical plan is reviewed, posterior cervical decompression and fusion C5-T1 with revision C5-6.  Patient verbalizes understanding and agrees to proceed as scheduled.  Vista collar is fitted.  Orders: Instruction(s)/Education: Assessment Instruction  I10 Hypertension education  [phone] Dietary management education, guidance, and counseling             Provider:  Vertell Limber MD, Marchia Meiers 03/28/2017 5:33 PM  Dictation edited by: Mike Craze. Poteat RN    CC Providers: Stoney Bang [place],  [place]  [id number]-   Xaje Hasanaj  [address] [place],  [address]-              Electronically signed by Marchia Meiers Vertell Limber MD  on 03/31/2017 01:07 PM  Patient ID:   [id number] Patient: [name] [name]  Date of Birth: 28-Feb-1951 Visit Type: Office Visit   Date: 01/03/2017 12:45 PM Provider: Marchia Meiers. Vertell Limber MD   This 67 year old female presents for MRI results.   History of Present Illness: 1.  MRI results  Patient reports pain across the shoulders radiating to the upper part of the arms. Worst pain is located at the back of the head radiating to the right. She notes the pain is slightly alleviated with a heating pad. Patient reports pain disrupts her sleeping. She notes decreased strength in her hands. Patient reports she has difficulty swallowing, she notes choking on mashed potatoes and a peach in the past week. She notes this may be related to being intubated several times.  She is complaining of weakness in her hands, left greater than right.         Medical/Surgical/Interim History Reviewed, no change.  Last detailed document date:12/06/2016.     Family History: Reviewed, no changes.    Social History: Reviewed, no changes.   MEDICATIONS(added, continued or stopped this visit): Started Medication Directions Instruction Stopped   Boniva 150mg  *UAD     gabapentin 600 mg tablet take 1 tablet by oral route 3 times every day     metformin 500 mg tablet take 1 tablet by oral route 2 times every day with morning and evening meals     Nexium 40 mg capsule,delayed release take 1 capsule by oral route  every day     Opana ER 40 mg tablet,extended release take 1 tablet by oral route  every 12 hours at least 1 hour before or 2 hours after meals     Premarin 1.25 mg tablet take 1 tablet by oral route  every day for 21 consecutive days, followed by 7 days off     ProAir HFA 90 mcg/actuation aerosol inhaler inhale 2 puff by inhalation route  every 4 - 6 hours as needed     Roxicodone 15 mg tablet take 1 tablet by oral route  every 8 hours     Symbicort 80 mcg-4.5 mcg/actuation HFA aerosol inhaler inhale 2 puff  by inhalation route 2 times every day in the morning and evening     Valium 10 mg tablet take 1 tablet by oral route 3 times every day as needed for spasms     Xopenex HFA 45 mcg/actuation aerosol inhaler inhale 2 puff by inhalation route  every 6 hours     Ziac 5 mg-6.25 mg tablet take 1 tablet by oral route  every day       ALLERGIES: Ingredient Reaction Medication Name Comment  CLARITHROMYCIN  Biaxin   OXYCODONE HCL  Percocet   ACETAMINOPHEN  Percocet   ACETAMINOPHEN  Tylenol   LATEX     MORPHINE     ASPIRIN      Reviewed, no changes.    Vitals Date Temp F BP Pulse Ht In Wt Lb BMI BSA Pain Score  01/03/2017  134/89 77 63.5 189 32.95  9/10      IMPRESSION Patient presents to review MRI results. She complains of pain to the back of her head, and between her shoulders radiating through the arms. On confrontational testing, 4- weakness in the left hand. MRI shows a malpositioned screw at C6 on the left, and severe foraminal stenosis C7-T1 on the left. 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 Patient will be seen by ENT for swallowing evaluation prior to surgery. Patient will be scheduled for posterior decompression and fusion with revision of hardware C5-T1 levels and use the Airo intraoperative CT scanner to confirm the positioning of hardware.  Completed Orders (this encounter) Order Details Reason Side Interpretation Result Initial Treatment Date Region  Dietary management education, guidance, and counseling Encouraged to eat a well balanced diet and follow up with primary care physician.        Hypertension education Follow up with primary care physician for elevated blood pressure.         Assessment/Plan # Detail Type Description   1. Assessment Body mass index (BMI) 32.0-32.9, adult (M01.02).   Plan Orders Today's instructions / counseling include(s) Dietary management education, guidance, and counseling.       2. Assessment Essential  (primary) hypertension (I10).           Pain Management Plan Pain Scale: 9/10. Method: Numeric Pain Intensity Scale. Location: head, shoulders, arm, neck. Onset: 06/20/2015. Duration: varies. Quality: discomforting. Pain management follow-up plan of care: Patient taking medication as prescribed..  Patient will be scheduled for posterior decompression and fusion with revision off hardware C5-T1 levels and use the Airo intraoperative CT scanner to confirm the positioning of hardware. Nurse education given.  Orders: Instruction(s)/Education: Assessment Instruction  I10 Hypertension education  [phone] Dietary management education, guidance, and counseling             Provider:  Vertell Limber MD, Marchia Meiers 01/03/2017 4:05 PM  Dictation edited by: Lucita Lora    CC Providers: Stoney Bang [place],  [place]  [id number]-   Xaje Hasanaj  [address] [place], [place] [address]-              Electronically signed by Marchia Meiers. Vertell Limber MD on 01/06/2017 02:19 PM  Patient ID:   [id number] Patient: [name] [name]  Date of Birth: [birth year] Visit Type: Office Visit   Date: 12/06/2016 11:15 AM Provider: Marchia Meiers. Vertell Limber MD   This 66 year old female presents for neck pain.   History of Present Illness: 1.  neck pain  [name] [name], former patient of Dr. Joya Salm, visits reporting increasing neck pain since January 2017.  She also notes pain radiating to the right side of her scalp, down her right arm to elbow, and occasionally into her left arm.  She also notes a history of arachnoiditis and voices apprehension about increased lumbar pain with occasional bilateral thigh pain.  Patient is currently followed by Dr. Tessa Lerner for pain management.  Surgical history includes L1 kyphoplasty,  L2-5 fusion, ACDF C5-6, posterior cervical fusion C5-6, anterior ACDF C4-5  Medical history includes mitral valve prolapse, leaky pulmonic and tricuspid valves; heart murmur,  asthma, HTN, NIDDM.    X-rays on [facility]           MEDICATIONS(added, continued or stopped this visit): Started Medication Directions Instruction Stopped   Boniva 150mg  *UAD     gabapentin 600 mg tablet take 1 tablet by oral route 3 times every day     metformin 500 mg tablet take 1 tablet by oral route 2 times every day with morning and evening meals     Nexium 40 mg capsule,delayed release take 1 capsule by oral route  every day     Opana ER 40 mg tablet,extended release take 1 tablet by oral route  every 12 hours at least 1 hour before or 2 hours after meals     Premarin 1.25 mg tablet take 1 tablet by oral route  every day for 21 consecutive days, followed by 7 days off     ProAir HFA 90 mcg/actuation aerosol inhaler inhale 2 puff by inhalation route  every 4 - 6 hours as needed     Roxicodone 15 mg tablet take 1 tablet by oral route  every 8 hours     Symbicort 80 mcg-4.5 mcg/actuation HFA aerosol inhaler inhale 2 puff by inhalation route 2 times every day in the morning and evening     Valium 10 mg tablet take 1 tablet by oral route 3 times every day as needed for spasms     Xopenex HFA 45 mcg/actuation aerosol inhaler inhale 2 puff by inhalation route  every 6 hours     Ziac 5 mg-6.25 mg tablet take 1 tablet by oral route  every day       ALLERGIES: Ingredient Reaction Medication Name Comment  CLARITHROMYCIN  Biaxin   OXYCODONE HCL  Percocet   ACETAMINOPHEN  Percocet   ACETAMINOPHEN  Tylenol   LATEX     MORPHINE     ASPIRIN         Vitals Date Temp F BP Pulse Ht In Wt Lb BMI BSA Pain Score  12/06/2016  161/82 78 63.5 192.4 33.55  7/10     DIAGNOSTIC RESULTS CT Myelogram 08/25/15: Stable L2 through L5 fusion. No significant adjacent segment disease. Severe arachnoiditis. Stable thoracic degenerative disc disease without compressive lesion or stenosis.    IMPRESSION Patient presents with neck pain radiating to the shoulder and right arm and low back pain. The  lumbar surgery appears well healed. Patient will scheduled for cervical MRI for further evaluation.   Myelogram shows a fracture at L1 that has been treated with cement augmentation, and severe arachnoiditis.  X-ray shows well placed hardware in the cervical spine.  Physical: Stiff neck and pain with rotation. Full strength in lower extremities. Decreased strength in upper extremities.  Completed Orders (this encounter) Order Details Reason Side Interpretation Result Initial Treatment Date Region  Cervical Spine- AP/Lat/Flex/Ex 1 of 2     12/06/2016 All Levels to All Levels  Lumbar Spine- AP/Lat/Flex/Ex 2 of 2     12/06/2016 All Levels to All Levels   Assessment/Plan # Detail Type Description   1. Assessment Cervical radiculopathy (M54.12).       2. Assessment Lumbar radiculopathy (M54.16).           Pain Management Plan Pain Scale: 7/10. Method: Numeric Pain Intensity Scale. Location: neck. Onset: 06/20/2015. Pain management follow-up plan of care: Patient  taking medication as prescribed..  Fall Risk Plan Falls risk follow-up plan of care: Assisted devices: Advise to use safety measures when available.  Patient will be scheduled for a cervical MRI. Follow-up after MRI.  Orders: Diagnostic Procedures: Assessment Procedure  M54.12 Cervical Spine- AP/Lat/Flex/Ex  M54.16 Lumbar Spine- AP/Lat/Flex/Ex             Provider:  Vertell Limber MD, Marchia Meiers 12/06/2016 1:11 PM  Dictation edited by: Lucita Lora    CC Providers: Stoney Bang [place],  [place]  [id number]-   Xaje Hasanaj  [address] [place], [place] [address]-              Electronically signed by Marchia Meiers Vertell Limber MD on 12/07/2016 05:44 PM

## 2017-05-17 NOTE — Brief Op Note (Signed)
05/17/2017  11:28 AM  PATIENT:  [name] [name]  66 y.o. female  PRE-OPERATIVE DIAGNOSIS:  Spinal stenosis, Cervical region, herniated cervical disc with radiculopathy, malpositioned lateral mass screw, cervicalgia  POST-OPERATIVE DIAGNOSIS:  Spinal stenosis, Cervical region, herniated cervical disc with radiculopathy, malpositioned lateral mass screw, cervicalgia  PROCEDURE:  Procedure(s) with comments: Cervical five  to Thorastic one  Posterior cervical fusion with lateral mass screws/revision of prior instrumentation (N/A) - C5 to T1 Posterior cervical fusion with lateral mass screws/revision of prior instrumentation APPLICATION OF INTRAOPERATIVE CT SCAN (N/A) with posterior cervical decompression C 7 T 1 level  SURGEON:  Surgeon(s) and Role:    Erline Levine, MD - Primary    * Kary Kos, MD - Assisting  PHYSICIAN ASSISTANT:   ASSISTANTS: Poteat, RN   ANESTHESIA:   general  EBL:  350 mL   BLOOD ADMINISTERED:none  DRAINS: (Medium ) Hemovact drain(s) in the posterior cervical space with  Suction Open   LOCAL MEDICATIONS USED:  MARCAINE    and LIDOCAINE   SPECIMEN:  No Specimen  DISPOSITION OF SPECIMEN:  N/A  COUNTS:  YES  TOURNIQUET:  * No tourniquets in log *  DICTATION:DICTATION:   Indications:  Patient is a 66 year old woman status post posterior cervical fusion with malpositioned left C 6 lateral mass screw with severe foraminal stenosis C 7 T 1 left with left hand weakness.   It was elected to take her to surgery for posterior fusion from C 5 through T 1 with instrumentation and with posterolateral arthrodesis with Airo intraoperative CT navigation..  Procedure:  After the smooth and uncomplicated induction of general endotracheal anesthesia, patient was placed in 3 pin fixation with radiolucent headholder and the patient was placed in slight extension.  The alignment was improved with these maneuvers.  The patient was on the Airo table.  Scalp was shaved.   After  prep and drape with betadine scrub and Dura prep, area of incision was infiltrated with lidocaine.  Incision was made and C 4 levels to T 2 on both sides.  Previously placed hardware was removed. Intraoperative CT was obtained for purposes of navigation.  Lateral mass screws were placed at C 5 left  And C 6 right (4.0 x 14 mm)  T 1 pedicle screws were placed (4.5 x 28 mm) bilaterally.   C 7 lateral mass screws were placed bilaterally (3.5 x 14 mm). After screws were placed, a second CT scan was obtained which showed well positioned hardware.  Rods were cut and then affixed to the screw heads.Taminectomy and foraminotomy was performed at C 7 T 1 on the left with wide decompression of left T 1 nerve root. The posterolateral C 5 - T 1 levels was decorticated and packed with extra extra small BMP and 5 cc of Attrax bone graft extender along with local bone autograft.  A medium Hemovac drain was placed.  Hemostasis was assured, then wounds were irrigated, and closed with interrupted vicryl sutures(0, 2-0, 3-0)  to reapproximate the skin edges.  Sterile occlusive dressing was placed and Vista collar placed, patient was removed from pins and returned to the OR stretcher.  Counts were correct at the end of the case.  PLAN OF CARE: Admit to inpatient   PATIENT DISPOSITION:  PACU - hemodynamically stable.   Delay start of Pharmacological VTE agent (>24hrs) due to surgical blood loss or risk of bleeding: yes

## 2017-05-17 NOTE — Interval H&P Note (Signed)
History and Physical Interval Note:  05/17/2017 7:12 AM  [name] [name]  has presented today for surgery, with the diagnosis of Spinal stenosis, Cervical region  The various methods of treatment have been discussed with the patient and family. After consideration of risks, benefits and other options for treatment, the patient has consented to  Procedure(s) with comments: C5 to T1 Posterior cervical fusion with lateral mass screws/revision of prior instrumentation (N/A) - C5 to T1 Posterior cervical fusion with lateral mass screws/revision of prior instrumentation APPLICATION OF INTRAOPERATIVE CT SCAN (N/A) as a surgical intervention .  [name] history has been reviewed, patient examined, no change in status, stable for surgery.  I have reviewed the patient's chart and labs.  Questions were answered to the patient's satisfaction.     Justn Quale D

## 2017-05-17 NOTE — Anesthesia Postprocedure Evaluation (Signed)
Anesthesia Post Note  Patient: ANICE WILSHIRE  Procedure(s) Performed: Cervical five  to Thorastic one  Posterior cervical fusion with lateral mass screws/revision of prior instrumentation (N/A Neck) APPLICATION OF INTRAOPERATIVE CT SCAN (N/A Neck)     Patient location during evaluation: PACU Anesthesia Type: General Level of consciousness: awake and alert Pain management: pain level controlled Vital Signs Assessment: post-procedure vital signs reviewed and stable Respiratory status: spontaneous breathing, nonlabored ventilation, respiratory function stable and patient connected to nasal cannula oxygen Cardiovascular status: blood pressure returned to baseline and stable Postop Assessment: no apparent nausea or vomiting Anesthetic complications: no    Last Vitals:  Vitals:   05/17/17 1311 05/17/17 1348  BP: 136/75 116/64  Pulse: 95 (!) 103  Resp:  20  Temp:  36.7 C  SpO2:  94%    Last Pain:  Vitals:   05/17/17 1348  TempSrc: Oral  PainSc:                  Graysyn Bache DAVID

## 2017-05-17 NOTE — Op Note (Signed)
05/17/2017  11:28 AM  PATIENT:  [name] [name]  66 y.o. female  PRE-OPERATIVE DIAGNOSIS:  Spinal stenosis, Cervical region, herniated cervical disc with radiculopathy, malpositioned lateral mass screw, cervicalgia  POST-OPERATIVE DIAGNOSIS:  Spinal stenosis, Cervical region, herniated cervical disc with radiculopathy, malpositioned lateral mass screw, cervicalgia  PROCEDURE:  Procedure(s) with comments: Cervical five  to Thorastic one  Posterior cervical fusion with lateral mass screws/revision of prior instrumentation (N/A) - C5 to T1 Posterior cervical fusion with lateral mass screws/revision of prior instrumentation APPLICATION OF INTRAOPERATIVE CT SCAN (N/A) with posterior cervical decompression C 7 T 1 level  SURGEON:  Surgeon(s) and Role:    Erline Levine, MD - Primary    * Kary Kos, MD - Assisting  PHYSICIAN ASSISTANT:   ASSISTANTS: Poteat, RN   ANESTHESIA:   general  EBL:  350 mL   BLOOD ADMINISTERED:none  DRAINS: (Medium ) Hemovact drain(s) in the posterior cervical space with  Suction Open   LOCAL MEDICATIONS USED:  MARCAINE    and LIDOCAINE   SPECIMEN:  No Specimen  DISPOSITION OF SPECIMEN:  N/A  COUNTS:  YES  TOURNIQUET:  * No tourniquets in log *  DICTATION:DICTATION:   Indications:  Patient is a 66 year old woman status post posterior cervical fusion with malpositioned left C 6 lateral mass screw with severe foraminal stenosis C 7 T 1 left with left hand weakness.   It was elected to take her to surgery for posterior fusion from C 5 through T 1 with instrumentation and with posterolateral arthrodesis with Airo intraoperative CT navigation..  Procedure:  After the smooth and uncomplicated induction of general endotracheal anesthesia, patient was placed in 3 pin fixation with radiolucent headholder and the patient was placed in slight extension.  The alignment was improved with these maneuvers.  The patient was on the Airo table.  Scalp was shaved.   After  prep and drape with betadine scrub and Dura prep, area of incision was infiltrated with lidocaine.  Incision was made and C 4 levels to T 2 on both sides.  Previously placed hardware was removed. Intraoperative CT was obtained for purposes of navigation.  Lateral mass screws were placed at C 5 left  And C 6 right (4.0 x 14 mm)  T 1 pedicle screws were placed (4.5 x 28 mm) bilaterally.   C 7 lateral mass screws were placed bilaterally (3.5 x 14 mm). After screws were placed, a second CT scan was obtained which showed well positioned hardware.  Rods were cut and then affixed to the screw heads.Taminectomy and foraminotomy was performed at C 7 T 1 on the left with wide decompression of left T 1 nerve root. The posterolateral C 5 - T 1 levels was decorticated and packed with extra extra small BMP and 5 cc of Attrax bone graft extender along with local bone autograft.  A medium Hemovac drain was placed.  Hemostasis was assured, then wounds were irrigated, and closed with interrupted vicryl sutures(0, 2-0, 3-0)  to reapproximate the skin edges.  Sterile occlusive dressing was placed and Vista collar placed, patient was removed from pins and returned to the OR stretcher.  Counts were correct at the end of the case.  PLAN OF CARE: Admit to inpatient   PATIENT DISPOSITION:  PACU - hemodynamically stable.   Delay start of Pharmacological VTE agent (>24hrs) due to surgical blood loss or risk of bleeding: yes

## 2017-05-17 NOTE — Anesthesia Preprocedure Evaluation (Addendum)
Anesthesia Evaluation  Patient identified by MRN, date of birth, ID band Patient awake    Reviewed: Allergy & Precautions, NPO status , Patient's Chart, lab work & pertinent test results  History of Anesthesia Complications (+) PONV  Airway Mallampati: II  TM Distance: >3 FB Neck ROM: Limited  Mouth opening: Limited Mouth Opening  Dental  (+) Teeth Intact, Chipped, Dental Advisory Given,    Pulmonary asthma ,    Pulmonary exam normal        Cardiovascular hypertension, Pt. on medications Normal cardiovascular exam     Neuro/Psych Anxiety    GI/Hepatic GERD  Medicated and Controlled,  Endo/Other  diabetes, Type 2, Oral Hypoglycemic Agents  Renal/GU      Musculoskeletal   Abdominal   Peds  Hematology   Anesthesia Other Findings   Reproductive/Obstetrics                            Anesthesia Physical Anesthesia Plan  ASA: III  Anesthesia Plan: General   Post-op Pain Management:    Induction: Intravenous  PONV Risk Score and Plan: 4 or greater and Scopolamine patch - Pre-op, Dexamethasone, Ondansetron and Midazolam  Airway Management Planned: Oral ETT  Additional Equipment:   Intra-op Plan:   Post-operative Plan: Extubation in OR  Informed Consent: I have reviewed the patients History and Physical, chart, labs and discussed the procedure including the risks, benefits and alternatives for the proposed anesthesia with the patient or authorized representative who has indicated his/her understanding and acceptance.     Plan Discussed with: CRNA and Surgeon  Anesthesia Plan Comments:         Anesthesia Quick Evaluation

## 2017-05-17 NOTE — Transfer of Care (Signed)
Immediate Anesthesia Transfer of Care Note  Patient: MIRELLE BISKUP  Procedure(s) Performed: Cervical five  to Thorastic one  Posterior cervical fusion with lateral mass screws/revision of prior instrumentation (N/A Neck) APPLICATION OF INTRAOPERATIVE CT SCAN (N/A Neck)  Patient Location: PACU  Anesthesia Type:General  Level of Consciousness: awake, alert  and oriented  Airway & Oxygen Therapy: Patient Spontanous Breathing and Patient connected to nasal cannula oxygen  Post-op Assessment: Report given to RN, Post -op Vital signs reviewed and stable and Patient moving all extremities X 4  Post vital signs: Reviewed and stable  Last Vitals:  Vitals:   05/17/17 0626  BP: (!) 159/72  Pulse: 80  Resp: 18  Temp: 36.6 C  SpO2: 97%    Last Pain:  Vitals:   05/17/17 0626  TempSrc: Oral  PainSc:       Patients Stated Pain Goal: 4 (88/82/80 0349)  Complications: No apparent anesthesia complications

## 2017-05-17 NOTE — Anesthesia Procedure Notes (Signed)
Procedure Name: Intubation Date/Time: 05/17/2017 7:39 AM Performed by: Lillia Abed, MD Pre-anesthesia Checklist: Patient identified, Emergency Drugs available, Suction available and Patient being monitored Patient Re-evaluated:Patient Re-evaluated prior to induction Oxygen Delivery Method: Circle System Utilized Preoxygenation: Pre-oxygenation with 100% oxygen Induction Type: IV induction Ventilation: Mask ventilation without difficulty Laryngoscope Size: Miller and 2 Grade View: Grade I Tube type: Oral Tube size: 7.0 mm Number of attempts: 1 Airway Equipment and Method: Stylet Placement Confirmation: ETT inserted through vocal cords under direct vision,  positive ETCO2 and breath sounds checked- equal and bilateral Secured at: 22 cm Tube secured with: Tape Dental Injury: Teeth and Oropharynx as per pre-operative assessment  Comments: Performed by Garner Gavel SRNA under direct supervision

## 2017-05-18 LAB — GLUCOSE, CAPILLARY: Glucose-Capillary: 140 mg/dL — ABNORMAL HIGH (ref 65–99)

## 2017-05-18 MED ORDER — METHOCARBAMOL 750 MG PO TABS: 750.0000 mg | ORAL_TABLET | Freq: Four times a day (QID) | ORAL | 1 refills | Status: DC | PRN

## 2017-05-18 NOTE — Discharge Summary (Addendum)
Physician Discharge Summary  Patient ID: ALJEAN HORIUCHI MRN: [id number] DOB/AGE: 64-Jul-1952 64 y.o.  Admit date: 05/17/2017 Discharge date: 05/18/2017  Admission Diagnoses: Spinal stenosis, Cervical region, herniated cervical disc with radiculopathy, malpositioned lateral mass screw, cervicalgia     Discharge Diagnoses: Spinal stenosis, Cervical region, herniated cervical disc with radiculopathy, malpositioned lateral mass screw, cervicalgia s/p Cervical five  to Thorastic one  Posterior cervical fusion with lateral mass screws/revision of prior instrumentation (N/A) - C5 to T1 Posterior cervical fusion with lateral mass screws/revision of prior instrumentation APPLICATION OF INTRAOPERATIVE CT SCAN (N/A) with posterior cervical decompression C 7 T 1 level    Active Problems:   Cervical stenosis of spinal canal   Discharged Condition: good  Hospital Course: Santosha Jividen was admitted for surgery with dx cervical stenosis, HNP, malpositioned screw, and cervicalgia. Following uncomplicated revision of prior fusion, decompression C7-T1, and fixation C5-T1, she recovered well. She is mobilizing nicely with decreased pain and improved LUE strength.   Consults: None  Significant Diagnostic Studies: radiology: CT scan: intra-op   Treatments: surgery: Cervical five  to Thorastic one  Posterior cervical fusion with lateral mass screws/revision of prior instrumentation (N/A) - C5 to T1 Posterior cervical fusion with lateral mass screws/revision of prior instrumentation APPLICATION OF INTRAOPERATIVE CT SCAN (N/A) with posterior cervical decompression C 7 T 1 level     Discharge Exam: Blood pressure 126/67, pulse 87, temperature 97.9 F (36.6 C), temperature source Oral, resp. rate 20, height '5\' 5"'$  (1.651 m), weight 82.1 kg (181 lb), SpO2 97 %. Alert, conversant. Incision without erythema, swelling, or drainage beneath dermabond and honeycomb drsg. Vista collar in use. Improved LUE and hand  intrinsics strength. Good strength RUE.      Disposition: 01-Home or Self Care  Pt has pain medications at home. Robaxin rx to chart for prn home use. Pt verbalizes understanding of d/c instructions. Pt has f/u appt at office.       Allergies as of 05/18/2017      Reactions   Latex Itching, Rash   Morphine And Related Hives, Itching   Aspirin Nausea And Vomiting   Can take coated asa   Biaxin [clarithromycin] Nausea And Vomiting   Clarithromycin Diarrhea   Codeine Itching   Darvocet [propoxyphene N-acetaminophen] Itching   Hydrocodone-acetaminophen Itching   Lortab [hydrocodone-acetaminophen] Itching   Percocet [oxycodone-acetaminophen] Itching      Medication List    TAKE these medications   alendronate 70 MG tablet Commonly known as:  FOSAMAX Take 70 mg by mouth every Wednesday. Take with a full glass of water on an empty stomach.   aspirin EC 81 MG tablet Take 81 mg by mouth at bedtime.   budesonide-formoterol 80-4.5 MCG/ACT inhaler Commonly known as:  SYMBICORT Inhale 2 puffs into the lungs 2 (two) times daily.   Calcium Carbonate-Vit D-Min 1200-1000 MG-UNIT Chew Chew 1 tablet by mouth daily.   cyclobenzaprine 10 MG tablet Commonly known as:  FLEXERIL Take 10 mg by mouth See admin instructions. Take '10mg'$  by mouth twice daily every other week alternating with valium   diazepam 10 MG tablet Commonly known as:  VALIUM Take 10 mg by mouth See admin instructions. Take '10mg'$  by mouth once daily as needed every other week alternating with cyclobenzaprine '10mg'$    diltiazem 240 MG 24 hr capsule Commonly known as:  CARDIZEM CD Take 240 mg by mouth daily.   esomeprazole 40 MG capsule Commonly known as:  NEXIUM Take 40 mg by mouth every morning.  estradiol 2 MG tablet Commonly known as:  ESTRACE Take 2 mg by mouth daily.   Fish Oil 1000 MG Caps Take 1 capsule by mouth 3 (three) times daily.   gabapentin 600 MG tablet Commonly known as:  NEURONTIN Take 1 tablet  (600 mg total) by mouth 4 (four) times daily. Take 1 tablet twice daily and two tablets at bedtime What changed:    how much to take  when to take this  additional instructions   JINTELI 1-5 MG-MCG Tabs tablet Generic drug:  norethindrone-ethinyl estradiol Take 1 tablet by mouth daily.   levalbuterol 45 MCG/ACT inhaler Commonly known as:  XOPENEX HFA Inhale 1-2 puffs into the lungs every 4 (four) hours as needed. For shortness of breath What changed:    how much to take  reasons to take this  additional instructions   losartan-hydrochlorothiazide 100-25 MG tablet Commonly known as:  HYZAAR TAKE ONE TABLET BY MOUTH EVERY DAY   metFORMIN 500 MG tablet Commonly known as:  GLUCOPHAGE Take 500 mg by mouth 2 (two) times daily with a meal.   methocarbamol 750 MG tablet Commonly known as:  ROBAXIN Take 1 tablet (750 mg total) by mouth every 6 (six) hours as needed for muscle spasms.   montelukast 10 MG tablet Commonly known as:  SINGULAIR Take 10 mg by mouth at bedtime.   naloxone 4 MG/0.1ML Liqd nasal spray kit Commonly known as:  NARCAN Place 1 spray into the nose once as needed (Just in case of Opiod Overdose).   oxyCODONE 15 MG immediate release tablet Commonly known as:  ROXICODONE Take 1 tablet (15 mg total) every 6 (six) hours as needed by mouth. For pain   Oxymorphone HCl (Crush Resist) 40 MG T12a Take 40 mg 2 (two) times daily by mouth.   simvastatin 10 MG tablet Commonly known as:  ZOCOR Take 10 mg by mouth daily at 6 PM.   valACYclovir 500 MG tablet Commonly known as:  VALTREX Take 500 mg by mouth daily.   venlafaxine XR 75 MG 24 hr capsule Commonly known as:  EFFEXOR-XR Take 75 mg by mouth daily.   vitamin B-12 1000 MCG tablet Commonly known as:  CYANOCOBALAMIN Take 1,000 mcg by mouth daily.      Follow-up Information    Erline Levine, MD Follow up.   Specialty:  Neurosurgery Contact information: [address] [place]  [place] [address] [phone]           Signed: Verdis Prime 05/18/2017, 7:43 AM    Patient is doing well.  Improved hand strength and radicular pain.

## 2017-05-18 NOTE — Evaluation (Signed)
Physical Therapy Evaluation & Discharge Patient Details Name: [name] [name] MRN: [id number] DOB: 1951-01-01 Today's Date: 05/18/2017   History of Present Illness  Pt is a 66 y/o female s/p posterior cervical fusion at C5-T1. PMH including but not limited to DM, HTN, anxiety, cardiomyopathy and hx of multiple neck and back surgeries including fusions.  Clinical Impression  Pt presented exiting bathroom and willing to participate in therapy session. Prior to admission, pt reported that she ambulated with use of rollator PRN and was independent with ADLs. Pt will have family available 24/7 upon d/c home. Pt ambulated in hallway without use of an AD with a mildly unsteady gait but no overt LOB or need for physical assistance. Pt also successfully completed stair training this session with no concerns. No further acute PT needs identified at this time. PT signing off. Patient would benefit from HHPT services upon d/c and assistance from family.     Follow Up Recommendations Home health PT;Supervision/Assistance - 24 hour    Equipment Recommendations  None recommended by PT    Recommendations for Other Services       Precautions / Restrictions Precautions Precautions: Fall;Cervical Precaution Comments: reviewed cervical precautions with pt; OT provided handout Required Braces or Orthoses: Cervical Brace Cervical Brace: Hard collar Restrictions Weight Bearing Restrictions: No      Mobility  Bed Mobility               General bed mobility comments: pt exiting bathroom upon arrival  Transfers Overall transfer level: Needs assistance Equipment used: None Transfers: Sit to/from Stand Sit to Stand: Supervision         General transfer comment: supervision for safety  Ambulation/Gait Ambulation/Gait assistance: Supervision;Min guard Ambulation Distance (Feet): 400 Feet Assistive device: None Gait Pattern/deviations: Step-through pattern;Decreased step length -  right;Decreased step length - left;Decreased stride length;Drifts right/left Gait velocity: decreased Gait velocity interpretation: Below normal speed for age/gender General Gait Details: mild instability but no overt LOB or need for physical assistance, min guard to supervision level for safety without use of an AD  Stairs Stairs: Yes Stairs assistance: Min guard Stair Management: One rail Left;Step to pattern;Forwards Number of Stairs: 4 General stair comments: no instability, increase in pain with ascending, min guard for safety  Wheelchair Mobility    Modified Rankin (Stroke Patients Only)       Balance Overall balance assessment: Needs assistance;History of Falls Sitting-balance support: Feet supported Sitting balance-Leahy Scale: Good     Standing balance support: During functional activity;No upper extremity supported Standing balance-Leahy Scale: Fair Standing balance comment: static standing is fair                             Pertinent Vitals/Pain Pain Assessment: Faces Faces Pain Scale: Hurts even more Pain Location: neck, shoulders Pain Descriptors / Indicators: Sore;Aching Pain Intervention(s): Monitored during session;Repositioned    Home Living Family/patient expects to be discharged to:: Private residence Living Arrangements: Children Available Help at Discharge: Family;Available 24 hours/day Type of Home: Mobile home Home Access: Stairs to enter Entrance Stairs-Rails: None Entrance Stairs-Number of Steps: 3 Home Layout: One level Home Equipment: Tub bench;Walker - 4 wheels      Prior Function Level of Independence: Independent with assistive device(s)         Comments: rollator for mobility PRN     Hand Dominance   Dominant Hand: Right    Extremity/Trunk Assessment   Upper Extremity Assessment Upper Extremity  Assessment: Defer to OT evaluation    Lower Extremity Assessment Lower Extremity Assessment: Overall WFL for  tasks assessed    Cervical / Trunk Assessment Cervical / Trunk Assessment: Other exceptions Cervical / Trunk Exceptions: s/p cervical sx  Communication   Communication: No difficulties  Cognition Arousal/Alertness: Awake/alert Behavior During Therapy: WFL for tasks assessed/performed Overall Cognitive Status: Within Functional Limits for tasks assessed                                        General Comments      Exercises     Assessment/Plan    PT Assessment All further PT needs can be met in the next venue of care  PT Problem List Decreased strength;Decreased activity tolerance;Decreased balance;Decreased mobility;Decreased coordination;Decreased knowledge of use of DME;Decreased safety awareness;Decreased knowledge of precautions;Pain       PT Treatment Interventions      PT Goals (Current goals can be found in the Care Plan section)  Acute Rehab PT Goals Patient Stated Goal: return home    Frequency     Barriers to discharge        Co-evaluation               AM-PAC PT "6 Clicks" Daily Activity  Outcome Measure Difficulty turning over in bed (including adjusting bedclothes, sheets and blankets)?: A Lot Difficulty moving from lying on back to sitting on the side of the bed? : A Lot Difficulty sitting down on and standing up from a chair with arms (e.g., wheelchair, bedside commode, etc,.)?: A Little Help needed moving to and from a bed to chair (including a wheelchair)?: A Little Help needed walking in hospital room?: A Little Help needed climbing 3-5 steps with a railing? : A Little 6 Click Score: 16    End of Session Equipment Utilized During Treatment: Cervical collar Activity Tolerance: Patient tolerated treatment well Patient left: with call bell/phone within reach;with family/visitor present;Other (comment)(sitting EOB) Nurse Communication: Mobility status PT Visit Diagnosis: Other abnormalities of gait and mobility  (R26.89);Pain Pain - Right/Left: (bilateral) Pain - part of body: Shoulder(neck)    Time: 8685-4883 PT Time Calculation (min) (ACUTE ONLY): 10 min   Charges:   PT Evaluation $PT Eval Moderate Complexity: 1 Mod     PT G Codes:        Russell Gardens, PT, DPT [place] 05/18/2017, 9:28 AM

## 2017-05-18 NOTE — Evaluation (Signed)
Occupational Therapy Evaluation Patient Details Name: [name] [name] MRN: [id number] DOB: Jan 29, 1951 Today's Date: 05/18/2017    History of Present Illness Pt is a 66 y/o female s/p posterior cervical fusion at C5-T1. PMH including but not limited to DM, HTN, anxiety, cardiomyopathy and hx of multiple neck and back surgeries including fusions.   Clinical Impression   Pt reports she was mod I with ADL PTA. Currently pt min assist with ADL and min guard for functional mobility. All neck, safety, and ADL education completed with pt. Pt planning to d/c home with 24/7 supervision from family. Pt would benefit from continued skilled OT to address established goals.     Follow Up Recommendations  No OT follow up;Supervision/Assistance - 24 hour    Equipment Recommendations  Tub/shower seat    Recommendations for Other Services       Precautions / Restrictions Precautions Precautions: Fall;Cervical Precaution Comments: Provided handout and reviewed back precautions with pt Required Braces or Orthoses: Cervical Brace Cervical Brace: Hard collar Restrictions Weight Bearing Restrictions: No      Mobility Bed Mobility Overal bed mobility: Needs Assistance Bed Mobility: Rolling;Sidelying to Sit;Sit to Sidelying Rolling: Supervision Sidelying to sit: Supervision     Sit to sidelying: Supervision General bed mobility comments: Supervision for safety, cues for log roll technique. HOB flat with use of bed rail.  Transfers Overall transfer level: Needs assistance Equipment used: None Transfers: Sit to/from Stand Sit to Stand: Min guard         General transfer comment: for safety, mild unsteadiness but no major LOB    Balance Overall balance assessment: Needs assistance;History of Falls Sitting-balance support: Feet supported;No upper extremity supported Sitting balance-Leahy Scale: Good     Standing balance support: No upper extremity supported;During functional  activity Standing balance-Leahy Scale: Fair Standing balance comment: static standing is fair                           ADL either performed or assessed with clinical judgement   ADL Overall ADL's : Needs assistance/impaired Eating/Feeding: Set up;Sitting   Grooming: Min guard;Standing Grooming Details (indicate cue type and reason): Educated on use of 2 cups for oral care Upper Body Bathing: Set up;Supervision/ safety;Sitting   Lower Body Bathing: Minimal assistance;Sit to/from stand   Upper Body Dressing : Set up;Supervision/safety;Sitting Upper Body Dressing Details (indicate cue type and reason): Educated on cervical collar management and wear schedule Lower Body Dressing: Minimal assistance;Sit to/from stand   Toilet Transfer: Min guard;Ambulation;Regular Toilet       Tub/ Banker: Min guard;Tub transfer;Ambulation;Shower Scientist, research (medical) Details (indicate cue type and reason): Educated on use of shower chair for safety with bathing; pt agreeable Functional mobility during ADLs: Min guard General ADL Comments: Educated pt on maintaining cervical precautions during functional activities, log roll for bed mobility, frequent mobility thorughout the day upon return home.      Vision         Perception     Praxis      Pertinent Vitals/Pain Pain Assessment: Faces Faces Pain Scale: Hurts even more Pain Location: neck Pain Descriptors / Indicators: Aching;Sore Pain Intervention(s): Monitored during session     Hand Dominance Right   Extremity/Trunk Assessment Upper Extremity Assessment Upper Extremity Assessment: Generalized weakness   Lower Extremity Assessment Lower Extremity Assessment: Defer to PT evaluation   Cervical / Trunk Assessment Cervical / Trunk Assessment: Other exceptions Cervical / Trunk Exceptions: s/p cervical sx  Communication Communication Communication: No difficulties   Cognition Arousal/Alertness:  Awake/alert Behavior During Therapy: WFL for tasks assessed/performed Overall Cognitive Status: Within Functional Limits for tasks assessed                                     General Comments       Exercises     Shoulder Instructions      Home Living Family/patient expects to be discharged to:: Private residence Living Arrangements: Children Available Help at Discharge: Family;Available 24 hours/day Type of Home: Mobile home Home Access: Stairs to enter Entrance Stairs-Number of Steps: 3 Entrance Stairs-Rails: None Home Layout: One level     Bathroom Shower/Tub: Walk-in Hydrologist: Handicapped height     Home Equipment: Tub bench;Walker - 4 wheels;Adaptive equipment Adaptive Equipment: Reacher;Sock aid        Prior Functioning/Environment Level of Independence: Independent with assistive device(s)        Comments: rollator for mobility PRN        OT Problem List: Decreased strength;Impaired balance (sitting and/or standing);Decreased knowledge of precautions;Impaired UE functional use;Pain      OT Treatment/Interventions: Self-care/ADL training;Neuromuscular education;DME and/or AE instruction;Therapeutic activities;Patient/family education;Balance training    OT Goals(Current goals can be found in the care plan section) Acute Rehab OT Goals Patient Stated Goal: return home OT Goal Formulation: With patient Time For Goal Achievement: 06/01/17 Potential to Achieve Goals: Good ADL Goals Pt Will Perform Lower Body Bathing: with supervision;sit to/from stand Pt Will Perform Lower Body Dressing: with supervision;sit to/from stand Pt Will Transfer to Toilet: with supervision;ambulating;regular height toilet Pt Will Perform Toileting - Clothing Manipulation and hygiene: with supervision;sit to/from stand Pt Will Perform Tub/Shower Transfer: with supervision;ambulating;shower seat Additional ADL Goal #1: Pt will independently  verbally recall cervical precautions and maintain throughout ADL.  OT Frequency: Min 2X/week   Barriers to D/C:            Co-evaluation              AM-PAC PT "6 Clicks" Daily Activity     Outcome Measure Help from another person eating meals?: A Little Help from another person taking care of personal grooming?: A Little Help from another person toileting, which includes using toliet, bedpan, or urinal?: A Little Help from another person bathing (including washing, rinsing, drying)?: A Little Help from another person to put on and taking off regular upper body clothing?: A Little Help from another person to put on and taking off regular lower body clothing?: A Little 6 Click Score: 18   End of Session Equipment Utilized During Treatment: Cervical collar Nurse Communication: Mobility status;Other (comment)(equipment and f/u needs)  Activity Tolerance: Patient tolerated treatment well Patient left: in bed;with call bell/phone within reach;with family/visitor present  OT Visit Diagnosis: Other abnormalities of gait and mobility (R26.89);Unsteadiness on feet (R26.81);Pain Pain - part of body: (neck)                Time: 5409-8119 OT Time Calculation (min): 15 min Charges:  OT General Charges $OT Visit: 1 Visit OT Evaluation $OT Eval Moderate Complexity: 1 Mod G-Codes:     Tyrisha Benninger A. Ulice Brilliant, M.S., OTR/L Pager: [place] 05/18/2017, 9:54 AM

## 2017-05-18 NOTE — Discharge Instructions (Signed)

## 2017-05-18 NOTE — Progress Notes (Addendum)
Subjective: Patient reports "My arm doesn't hurt like it did before"  Objective: Vital signs in last 24 hours: Temp:  [97.9 F (36.6 C)-99.1 F (37.3 C)] 97.9 F (36.6 C) (11/30 0400) Pulse Rate:  [73-116] 87 (11/30 0400) Resp:  [13-22] 20 (11/30 0400) BP: (116-149)/(54-77) 126/67 (11/30 0400) SpO2:  [94 %-98 %] 97 % (11/30 0400)  Intake/Output from previous day: 11/29 0701 - 11/30 0700 In: 1843.8 [I.V.:1743.8; IV Piggyback:100] Out: 3007 [Urine:2700; Drains:70; Blood:350] Intake/Output this shift: No intake/output data recorded.  Alert, conversant. Incision without erythema, swelling, or drainage beneath dermabond and honeycomb drsg. Vista collar in use. Improved LUE and hand intrinsics strength. Good strength RUE.   Lab Results: Recent Labs    05/17/17 0636  WBC 9.7  HGB 11.6*  HCT 35.2*  PLT 274   BMET Recent Labs    05/17/17 0636  NA 137  K 3.6  CL 102  CO2 27  GLUCOSE 113*  BUN 13  CREATININE 0.61  CALCIUM 8.3*    Studies/Results: No results found.  Assessment/Plan: IMproving  LOS: 1 day  Per DrStern, d/c to home. Pt has pain medications at home. Robaxin rx to chart for prn home use. Pt verbalizes understanding of d/c instructions. Pt has f/u appt at office.    Verdis Prime 05/18/2017, 7:38 AM  Patient is doing well.  Significant improvement in arm and hand strength and decreased pain.  Discharge home.

## 2017-05-18 NOTE — Progress Notes (Signed)
Patient is discharged from room 3C11 at this time. Alert and in stable condition. IV site d/c'd and instructions read to patient and family with understanding verbalized. Left unit via wheelchair with all belongings at side. 

## 2017-05-18 NOTE — Progress Notes (Signed)
Immediately after surgery, patient was awake, alert, conversant and moving all extremities well with good power.

## 2017-05-20 ENCOUNTER — Encounter (HOSPITAL_COMMUNITY): Payer: Self-pay | Admitting: Neurosurgery

## 2017-05-24 ENCOUNTER — Telehealth: Payer: Self-pay | Admitting: Registered Nurse

## 2017-05-24 ENCOUNTER — Telehealth: Payer: Self-pay | Admitting: Physical Medicine & Rehabilitation

## 2017-05-24 DIAGNOSIS — M961 Postlaminectomy syndrome, not elsewhere classified: Secondary | ICD-10-CM

## 2017-05-24 DIAGNOSIS — G039 Meningitis, unspecified: Secondary | ICD-10-CM

## 2017-05-24 MED ORDER — OXYCODONE HCL 15 MG PO TABS: 15.0000 mg | ORAL_TABLET | ORAL | 0 refills | Status: DC | PRN

## 2017-05-24 NOTE — Telephone Encounter (Signed)
Placed a call to [name] [name], no answer left message to return the call.

## 2017-05-24 NOTE — Telephone Encounter (Signed)
Placed a call to  [name],  Dr. Naaman Plummer in agreement with treatment regimen. We will prescribe Oxycodone 15 mg every 4 hours  for two weeks, during this time her Oxymorphone will be held. She verbalizes understanding. Also instructed to call office in a week to evaluate medication regime. She verbalizes understanding.

## 2017-05-24 NOTE — Telephone Encounter (Signed)
Return [name] [name] call, she wanted this provider to be aware she had her surgery , on 05/17/2017 by Dr. Vertell Limber Cervical five to Thorastic one Posterior cervical fusion with lateral mass screws/revision of prior instrumentation.  [name] states she is in excruciating Pain, she has resumed her usual medication regimen.  Oxymorphone 40 mg Q 12 hours and Oxycodone 15 mg Q 6 hours. She's not receiving relief of her pain.  Hospital Medication reviewed she was receiving Oxycodone 15 mg Q 4 hours.  [name] being seen for chronic pain her current MME was 330.00.  I sent a message to Dr. Naaman Plummer to see if we can change [name] to [place] to 15 mg Q 4 hours for a week, and hold her Oxymorphone awaiting Dr. Naaman Plummer response. [name] is aware of the above, this provider will call her back with answer, she verbalizes understanding.

## 2017-05-24 NOTE — Telephone Encounter (Signed)
Pt phoned to state she had surgery and wanted you to call her back. Please advise.

## 2017-05-29 ENCOUNTER — Telehealth: Payer: Self-pay | Admitting: Registered Nurse

## 2017-05-29 ENCOUNTER — Telehealth: Payer: Self-pay | Admitting: *Deleted

## 2017-05-29 ENCOUNTER — Encounter: Payer: Medicare Other | Admitting: Registered Nurse

## 2017-05-29 NOTE — Telephone Encounter (Signed)
[name] [name] needs to reschedule her appt with Zella Ball on Friday 06/01/17.  Her husband has an appt in [place] and will need the care.  We have rescheduled her to be her 10:45 on Monday 06/04/17.

## 2017-05-29 NOTE — Telephone Encounter (Signed)
Placed a call to Ms. Klebba, she's not able to make her appointment on today. She was rescheduled for 06/01/2017 at 1:20 p.m. she verbalzes understanding.  On 05/31/2017 she will resume her Oxymorphone and Oxycodone regimen.

## 2017-06-01 ENCOUNTER — Ambulatory Visit: Payer: Medicare Other | Admitting: Registered Nurse

## 2017-06-04 ENCOUNTER — Other Ambulatory Visit: Payer: Self-pay

## 2017-06-04 ENCOUNTER — Encounter: Payer: Self-pay | Admitting: Registered Nurse

## 2017-06-04 ENCOUNTER — Encounter: Payer: Medicare Other | Attending: Physical Medicine & Rehabilitation | Admitting: Registered Nurse

## 2017-06-04 VITALS — BP 129/84 | HR 104

## 2017-06-04 DIAGNOSIS — M961 Postlaminectomy syndrome, not elsewhere classified: Secondary | ICD-10-CM | POA: Diagnosis not present

## 2017-06-04 DIAGNOSIS — Z5181 Encounter for therapeutic drug level monitoring: Secondary | ICD-10-CM | POA: Diagnosis not present

## 2017-06-04 DIAGNOSIS — J441 Chronic obstructive pulmonary disease with (acute) exacerbation: Secondary | ICD-10-CM | POA: Diagnosis not present

## 2017-06-04 DIAGNOSIS — M5412 Radiculopathy, cervical region: Secondary | ICD-10-CM | POA: Diagnosis not present

## 2017-06-04 DIAGNOSIS — I10 Essential (primary) hypertension: Secondary | ICD-10-CM | POA: Diagnosis not present

## 2017-06-04 DIAGNOSIS — M5416 Radiculopathy, lumbar region: Secondary | ICD-10-CM

## 2017-06-04 DIAGNOSIS — G894 Chronic pain syndrome: Secondary | ICD-10-CM | POA: Diagnosis not present

## 2017-06-04 DIAGNOSIS — M542 Cervicalgia: Secondary | ICD-10-CM

## 2017-06-04 DIAGNOSIS — M5441 Lumbago with sciatica, right side: Secondary | ICD-10-CM | POA: Diagnosis not present

## 2017-06-04 DIAGNOSIS — G039 Meningitis, unspecified: Secondary | ICD-10-CM | POA: Diagnosis not present

## 2017-06-04 DIAGNOSIS — M546 Pain in thoracic spine: Secondary | ICD-10-CM

## 2017-06-04 DIAGNOSIS — Z79899 Other long term (current) drug therapy: Secondary | ICD-10-CM | POA: Insufficient documentation

## 2017-06-04 DIAGNOSIS — E1165 Type 2 diabetes mellitus with hyperglycemia: Secondary | ICD-10-CM | POA: Diagnosis not present

## 2017-06-04 MED ORDER — OXYMORPHONE HCL ER 40 MG PO T12A: 40.0000 mg | EXTENDED_RELEASE_TABLET | Freq: Two times a day (BID) | ORAL | 0 refills | Status: DC

## 2017-06-04 MED ORDER — GABAPENTIN 600 MG PO TABS: 600.0000 mg | ORAL_TABLET | Freq: Four times a day (QID) | ORAL | 3 refills | Status: DC

## 2017-06-04 MED ORDER — OXYCODONE HCL 15 MG PO TABS: 15.0000 mg | ORAL_TABLET | Freq: Four times a day (QID) | ORAL | 0 refills | Status: DC | PRN

## 2017-06-04 NOTE — Progress Notes (Signed)
Subjective:    Patient ID: [name] [name], female    DOB: 04/05/1951, 66 y.o.   MRN: [id number]  HPI:  [name] [name] is a 66year old female who returns for follow up appointmentfor chronic pain and medication refill. She states her pain is located in her neck radiating into her bilateral shoulders and occasionally into her upper extremities with tingling and burning and mid- lower back radiating into her bilateral  lower extremities.She rates her pain 8. Her current exercise regime is walking.  [name] Morphine equivalent is 240.00 MME. She is also prescribed Diazepam by Dr. Amanda Pea. We have reviewed the black box warning of using opioids and benzodiazepines. I highlighted the dangers of using these drugs together and discussed the adverse events including respiratory suppression, overdose, cognitive impairment and importance of  compliance with current regimen. She verbalizes understanding, we will continue to monitor and adjust as indicated.    [name] surgery was performed on 05/17/2017 by Dr. Vertell Limber . He performed C5C6C7 T1 Posterior Cervical Fusion with lateral mass fixation with AIRO Imaging, revision of prior instrumentation.   Last UDS was performed on 12/05/2016, it was consistent.   Pain Inventory Average Pain 7 Pain Right Now 8 My pain is sharp, burning, stabbing, tingling and aching  In the last 24 hours, has pain interfered with the following? General activity 7 Relation with others 7 Enjoyment of life 7 What TIME of day is your pain at its worst? all Sleep (in general) Fair  Pain is worse with: walking, sitting, standing and some activites Pain improves with: rest, heat/ice and medication Relief from Meds: 7  Mobility walk without assistance ability to climb steps?  yes do you drive?  no  Function disabled: date disabled . Do you have any goals in this area?  no  Neuro/Psych No problems in this area  Prior Studies Any changes since last visit?   no  Physicians involved in your care Any changes since last visit?  no   Family History  Problem Relation Age of Onset  . Hypertension Mother   . Heart disease Mother   . Diabetes Mother   . Cancer Father   . Diabetes Brother    Social History   Socioeconomic History  . Marital status: Divorced    Spouse name: None  . Number of children: None  . Years of education: None  . Highest education level: None  Social Needs  . Financial resource strain: None  . Food insecurity - worry: None  . Food insecurity - inability: None  . Transportation needs - medical: None  . Transportation needs - non-medical: None  Occupational History  . Occupation: disable    Employer: DISABLED  Tobacco Use  . Smoking status: Never Smoker  . Smokeless tobacco: Never Used  Substance and Sexual Activity  . Alcohol use: No  . Drug use: No  . Sexual activity: None    Comment: second hand smoke  Other Topics Concern  . None  Social History Narrative   Has one child   Disabled   Past Surgical History:  Procedure Laterality Date  . ABDOMINAL HYSTERECTOMY  1987   W/ BSO  . ANTERIOR CERVICAL DECOMP/DISCECTOMY FUSION N/A 08/09/2012   Procedure: ANTERIOR CERVICAL DECOMPRESSION/DISCECTOMY FUSION 1 LEVEL;  Surgeon: Floyce Stakes, MD;  Location: [facility];  Service: Neurosurgery;  Laterality: N/A;  Cervical four-five  Anterior cervical decompression/diskectomy/fusion  . ANTERIOR FUSION CERVICAL SPINE  2007   C5 -6  .  APPENDECTOMY    . APPLICATION OF INTRAOPERATIVE CT SCAN N/A 05/17/2017   Procedure: APPLICATION OF INTRAOPERATIVE CT SCAN;  Surgeon: Erline Levine, MD;  Location: [facility];  Service: Neurosurgery;  Laterality: N/A;  . BACK SURGERY     x 5  . BREAST SURGERY     x 2 biopsies  . CARPAL TUNNEL RELEASE  RIGHT - 2001  & DEC 2011 W/ BACK SURG.  . CERVICAL DISC SURGERY  2005   C5 - 6  . CHOLECYSTECTOMY  1994  . DORSAL COMPARTMENT RELEASE Right 04/07/2013   Procedure: RIGHT WRIST STS  RELEASE;  Surgeon: Schuyler Amor, MD;  Location: [facility];  Service: Orthopedics;  Laterality: Right;  . EXPLORATION OF INCISION FOR CSF LEAK  DEC 2011  X2   POST LAMINECOTMY  . FINGER ARTHRODESIS Right 04/07/2013   Procedure: RIGHT INDEX AND RIGHT LONG DISTAL INTERPHALANGEAL JOINT FUSIONS;  Surgeon: Schuyler Amor, MD;  Location: [facility];  Service: Orthopedics;  Laterality: Right;  . FINGER ARTHROPLASTY Right 04/07/2013   Procedure: RIGHT THUMB [place] ARTHROPLASTY;  Surgeon: Schuyler Amor, MD;  Location: [facility];  Service: Orthopedics;  Laterality: Right;  . GANGLION CYST EXCISION Right 04/07/2013   Procedure: RIGHT WRIST MASS EXCISION;  Surgeon: Schuyler Amor, MD;  Location: [facility];  Service: Orthopedics;  Laterality: Right;  . KNEE ARTHROSCOPY  LEFT X3 (LAST ONE 2005)  . KNEE ARTHROSCOPY  05/17/2011   Procedure: ARTHROSCOPY KNEE;  Surgeon: Bradley Ferris III;  Location: [facility];  Service: Orthopedics;  Laterality: Right;  WITH MEDIAL MENISECTOMY AND removal of suprapatella fat lump  . LEFT WRIST TENOSYNECTOMY W/ LEFT THUMB JOINT REPAIR  02-24-10  . LUMBAR FUSION  11-30-10   L4 - 5  . LUMBAR FUSION  2000   L2 - 4  . LUMBAR LAMINECTOMY  DEC 2011   L4 - 5  . POSTERIOR CERVICAL FUSION/FORAMINOTOMY N/A 05/17/2017   Procedure: Cervical five  to Thorastic one  Posterior cervical fusion with lateral mass screws/revision of prior instrumentation;  Surgeon: Erline Levine, MD;  Location: [facility];  Service: Neurosurgery;  Laterality: N/A;  C5 to T1 Posterior cervical fusion with lateral mass screws/revision of prior instrumentation  . TENDON REPAIR  JAN 2010   LEFT INDEX AND LONG FINGERS  . TUBAL LIGATION     Past Medical History:  Diagnosis Date  . Acute sinusitis, unspecified   . Anemia    "quite a few times"  . Anxiety state, unspecified   . Arachnoiditis BILATERAL LEGS   DUE TO MULTIPLE  BACK SURG.'S  . Arthritis   . Asthma    last flare up was 03/2017 lasted over a month  . Blood transfusion   . Cancer ([place])    skin cancers (in scalp)  . Cardiomyopathy HX --06/2010   EF was 25% during acute illness (PHELONEPHRITIS) Repeat echo 12-06-10 60% showed normal EF.   Marland Kitchen Chronic back pain greater than 3 months duration    S/P BACK SURG'S  . CSF leak   . Diabetes mellitus without complication ([place])    dx 2013 type 2  . Dyslipidemia   . Dysphagia    some post op cerv fusion 2/14  . Dysrhythmia   . Essential hypertension, benign   . Family history of adverse reaction to anesthesia    mother gets n/v  . GERD (gastroesophageal reflux disease) AND HIATIAL HERNIA   CONTROLLED W/ NEXIUM  .  Headache(784.0)   . Heart murmur    DENIES S & S   (ECHO JUN'12 W/ CHART)  . History of chronic bronchitis   . Hx of bladder infections   . Hypertension   . Murmur, heart   . Neuromuscular disorder (HCC)    numbness and tingling  . Osteoporosis   . Other malaise and fatigue   . PONV (postoperative nausea and vomiting)   . Shortness of breath   . Spinal headache   . Spinal stenosis, cervical region   . Varicosities    venous  . Weakness of both legs DUE TO ARACHNOIDITIS   OCCASIONAL USES CANE   BP 129/84   Pulse (!) 104   SpO2 95%   Opioid Risk Score:  2 Fall Risk Score:  `1  Depression screen PHQ 2/9  Depression screen [facility] 2/9 06/04/2017 05/03/2017 03/05/2017 01/05/2017 01/20/2016 12/20/2015 07/05/2015  Decreased Interest 1 0 0 0 0 0 1  Down, Depressed, Hopeless 1 0 0 0 0 0 1  PHQ - 2 Score 2 0 0 0 0 0 2  Altered sleeping - - - - - - -  Tired, decreased energy - - - - - - -  Change in appetite - - - - - - -  Feeling bad or failure about yourself  - - - - - - -  Trouble concentrating - - - - - - -  Moving slowly or fidgety/restless - - - - - - -  Suicidal thoughts - - - - - - -  PHQ-9 Score - - - - - - -  Some recent data might be hidden     Review of Systems   Constitutional: Negative.   HENT: Negative.   Eyes: Negative.   Respiratory: Negative.   Cardiovascular: Negative.   Gastrointestinal: Negative.   Endocrine: Negative.   Genitourinary: Negative.   Musculoskeletal: Negative.   Skin: Negative.   Allergic/Immunologic: Negative.   Neurological: Negative.   Hematological: Negative.   Psychiatric/Behavioral: Negative.   All other systems reviewed and are negative.      Objective:   Physical Exam  Constitutional: She is oriented to person, place, and time. She appears well-developed and well-nourished.  HENT:  Head: Normocephalic and atraumatic.  Neck: Normal range of motion. Neck supple.  Cervical Paraspinal Tenderness: C-5-C-6/ Wearing Cervical Collar  Cardiovascular: Normal rate and regular rhythm.  Pulmonary/Chest: Effort normal and breath sounds normal.  Musculoskeletal:  Normal Muscle Bulk and Muscle Testing Reveals: Upper Extremities: Full  ROM and Muscle Strength on the Right 4/5 and Left 3/5 Thoracic hypersensitivity: T-1-T-7 Lumbar Paraspinal Tenderness: L-3-L-5 Lower Extremities: Full ROM and Muscle Strength 5/5 Arises from Table slowly using straight cane for support Antalgic Gait  Neurological: She is alert and oriented to person, place, and time.  Skin: Skin is warm and dry.  Psychiatric: She has a normal mood and affect.  Nursing note and vitals reviewed.         Assessment & Plan:  1. On 05/17/2017 :C5C6C7 T1 Posterior Cervical Fusion with lateral mass fixation with AIRO Imaging, revision of prior instrumentation. By Dr. Vertell Limber.    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 Continue Gabapentin. 06/04/2017 Refilled:Oxycodone 15mg  one tablet every 6 hours as needed #60 and  Oxymorphone HCL 40 mg every 12 hours #60. We will continue the opioid monitoring program, this consists of regular clinic visits, examinations, urine drug screen, pill counts as well as use of [place] Controlled  Substance Reporting System. 2. 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 Continue to Monitor HEP on hold post surgical procedure: 06/04/2017 3. Anxiety/depression: Continue Valium, PCP Following. Continue  To monitor. 06/04/2017 4. Muscle Spasms: Continue Flexeril. Continue to Monitor. 06/04/2017 5. Neck Pain/ Radiculopathy: Continue Gabapentin: Dr. Vertell Limber Following: S/P  C5C6C7 T1 Posterior Cervical Fusion with lateral mass fixation with AIRO Imaging, revision of prior instrumentation by Dr. Vertell Limber on 05/17/2017  : 06/04/2017  20 minutes of face to face patient care time was spent during this visit. All questions were encouraged and answered.  F/U in 1 month

## 2017-06-07 DIAGNOSIS — E119 Type 2 diabetes mellitus without complications: Secondary | ICD-10-CM | POA: Diagnosis not present

## 2017-06-07 DIAGNOSIS — J4541 Moderate persistent asthma with (acute) exacerbation: Secondary | ICD-10-CM | POA: Diagnosis not present

## 2017-06-07 DIAGNOSIS — I10 Essential (primary) hypertension: Secondary | ICD-10-CM | POA: Diagnosis not present

## 2017-07-02 ENCOUNTER — Encounter: Payer: Self-pay | Admitting: Registered Nurse

## 2017-07-02 ENCOUNTER — Encounter: Payer: Medicare Other | Attending: Physical Medicine & Rehabilitation | Admitting: Registered Nurse

## 2017-07-02 VITALS — BP 135/85 | HR 85

## 2017-07-02 DIAGNOSIS — Z79899 Other long term (current) drug therapy: Secondary | ICD-10-CM

## 2017-07-02 DIAGNOSIS — Z5181 Encounter for therapeutic drug level monitoring: Secondary | ICD-10-CM | POA: Diagnosis not present

## 2017-07-02 DIAGNOSIS — M542 Cervicalgia: Secondary | ICD-10-CM

## 2017-07-02 DIAGNOSIS — M961 Postlaminectomy syndrome, not elsewhere classified: Secondary | ICD-10-CM

## 2017-07-02 DIAGNOSIS — M5441 Lumbago with sciatica, right side: Secondary | ICD-10-CM | POA: Insufficient documentation

## 2017-07-02 DIAGNOSIS — M5412 Radiculopathy, cervical region: Secondary | ICD-10-CM | POA: Diagnosis not present

## 2017-07-02 DIAGNOSIS — G039 Meningitis, unspecified: Secondary | ICD-10-CM

## 2017-07-02 DIAGNOSIS — M5416 Radiculopathy, lumbar region: Secondary | ICD-10-CM

## 2017-07-02 DIAGNOSIS — G894 Chronic pain syndrome: Secondary | ICD-10-CM

## 2017-07-02 MED ORDER — OXYCODONE HCL 15 MG PO TABS
15.0000 mg | ORAL_TABLET | Freq: Four times a day (QID) | ORAL | 0 refills | Status: DC | PRN
Start: 2017-07-02 — End: 2017-08-01

## 2017-07-02 MED ORDER — OXYCODONE HCL 15 MG PO TABS: 15.0000 mg | ORAL_TABLET | Freq: Four times a day (QID) | ORAL | 0 refills | Status: DC | PRN

## 2017-07-02 MED ORDER — OXYCODONE HCL 15 MG PO TABS
15.0000 mg | ORAL_TABLET | Freq: Four times a day (QID) | ORAL | 0 refills | Status: DC | PRN
Start: 2017-07-02 — End: 2017-07-02

## 2017-07-02 MED ORDER — OXYMORPHONE HCL ER 40 MG PO T12A: 40.0000 mg | EXTENDED_RELEASE_TABLET | Freq: Two times a day (BID) | ORAL | 0 refills | Status: DC

## 2017-07-02 NOTE — Patient Instructions (Addendum)
[name] [name] Reports increase intensity of Nerve Pain: She currently takes her Gabapentin : Two Tablets in the Morning and Two Tablets at  Bedtime  She was given permission to take extra Gabapentin (one tablet)  late afternoon when pain is sever,   instructed to call office in a week.  Arachnoiditis Arachnoiditis is a pain disorder caused by inflammation of the arachnoid. The arachnoid is one of three membranes (meninges) that cover the nerves of the spinal cord. Arachnoiditis gets worse over time (progresses) because inflammation can cause nerves in the spinal cord to become stuck together. What are the causes? This condition may be caused by:  Viral or bacterial infections, such as syphilis or tubercular meningitis.  Damage or injury (trauma) to the spine.  Complications from surgery.  Medicines used to numb the spine (spinal anesthetics).  Invasive procedures involving the spine, such as a lumbar puncture or a myelogram.  What increases the risk? This condition is more likely to develop in:  Women who are pregnant and may have spinal anesthesia.  People who have had back surgery.  People who have had spinal injections.  What are the signs or symptoms? The most common symptom of this condition is severe and long-lasting (chronic) pain. The location of the pain varies. Pain is most commonly located in the lower back, buttocks, legs, and feet. Other symptoms of arachnoiditis may include:  Burning and stinging pain.  A feeling of tingling, bugs crawling, water dripping, or electric shock.  Headaches.  Vision changes.  Hearing problems.  Dizziness.  Numbness.  Muscle cramps, spasms, or twitches.  Problems with the bladder, bowel movements, or sexual function.  Permanent inability to move the lower body and the legs (paralysis). This can happen in very severe cases.  Symptoms vary from person to person. As the condition progresses, some symptoms may get worse. How is  this diagnosed? This condition is diagnosed with:  A physical exam.  Medical history.  MRIwith dye injection (contrast).  CT scan.  Electromyogram (EMG).  How is this treated? There is no cure for this condition. The goals of treatment are to reduce pain and improve symptoms that interfere with daily life. Treatment methods may include:  Hot or cold compresses.  Massage.  Baths (hydrotherapy).  Mild exercise.  Physical therapy.  Psychotherapy.  Medicines to reduce pain.  Medicines to reduce inflammation (steroids).  Follow these instructions at home:  Take over-the-counter and prescription medicines only as told by your health care provider.  Eat healthy foods and drink enough fluid to keep your urine clear or pale yellow.  Exercise and do physical therapy as told by your health care provider.  Use hot and cold compresses as told by your health care provider.  Ask your health care provider about resources to help you live with this condition. He or she may recommend a support group or a therapist.  Pay attention to changes in your symptoms.  Keep all follow-up visits as told by your health care provider. This is important. Contact a health care provider if:  You have a fever.  You develop new symptoms. Get help right away if:  You have difficulty breathing.  You suddenly lose feeling in part of your body.  You have severe pain. This information is not intended to replace advice given to you by your health care provider. Make sure you discuss any questions you have with your health care provider. Document Released: 05/26/2002 Document Revised: 11/11/2015 Document Reviewed: 10/21/2014 Elsevier Interactive Patient  Education  2018 [employer]  

## 2017-07-02 NOTE — Progress Notes (Signed)
Subjective:    Patient ID: [name] [name], female    DOB: Oct 23, 1950, 67 y.o.   MRN: [id number]  HPI:  ChristineChristine [name] is a 67year old female who returns for follow up appointmentfor chronic pain and medication refill. She states her pain is located in her neck radiating into her right shoulder and  lower back radiating into her bilateral  lower extremities.She rates her pain 7. Her current exercise regime is walking.  [name] [name] Morphine equivalent is 307.00.00 MME. She is also prescribed Diazepam by Dr. Amanda Pea. We have reviewed  the black box warning again regarding using opioids and benzodiazepines. I highlighted the dangers of using these drugs together and discussed the adverse events including respiratory suppression, overdose, cognitive impairment and importance of  compliance with current regimen. She verbalizes understanding, we will continue to monitor and adjust as indicated.    [name] [name] surgery was performed on 05/17/2017 by Dr. Vertell Limber . He performed C5C6C7 T1 Posterior Cervical Fusion with lateral mass fixation with AIRO Imaging, revision of prior instrumentation.   Last UDS was performed on 12/05/2016, it was consistent.   Pain Inventory Average Pain 7 Pain Right Now 7 My pain is sharp, burning, stabbing and tingling  In the last 24 hours, has pain interfered with the following? General activity 7 Relation with others 7 Enjoyment of life 7 What TIME of day is your pain at its worst? all Sleep (in general) Poor  Pain is worse with: walking, sitting, standing and some activites Pain improves with: heat/ice and medication Relief from Meds: 7  Mobility walk without assistance ability to climb steps?  yes do you drive?  no  Function disabled: date disabled . Do you have any goals in this area?  no  Neuro/Psych No problems in this area  Prior Studies Any changes since last visit?  no  Physicians involved in your care Any changes since last visit?   no   Family History  Problem Relation Age of Onset  . Hypertension Mother   . Heart disease Mother   . Diabetes Mother   . Cancer Father   . Diabetes Brother    Social History   Socioeconomic History  . Marital status: Divorced    Spouse name: None  . Number of children: None  . Years of education: None  . Highest education level: None  Social Needs  . Financial resource strain: None  . Food insecurity - worry: None  . Food insecurity - inability: None  . Transportation needs - medical: None  . Transportation needs - non-medical: None  Occupational History  . Occupation: disable    Employer: DISABLED  Tobacco Use  . Smoking status: Never Smoker  . Smokeless tobacco: Never Used  Substance and Sexual Activity  . Alcohol use: No  . Drug use: No  . Sexual activity: None    Comment: second hand smoke  Other Topics Concern  . None  Social History Narrative   Has one child   Disabled   Past Surgical History:  Procedure Laterality Date  . ABDOMINAL HYSTERECTOMY  1987   W/ BSO  . ANTERIOR CERVICAL DECOMP/DISCECTOMY FUSION N/A 08/09/2012   Procedure: ANTERIOR CERVICAL DECOMPRESSION/DISCECTOMY FUSION 1 LEVEL;  Surgeon: Floyce Stakes, MD;  Location: [facility];  Service: Neurosurgery;  Laterality: N/A;  Cervical four-five  Anterior cervical decompression/diskectomy/fusion  . ANTERIOR FUSION CERVICAL SPINE  2007   C5 -6  . APPENDECTOMY    . APPLICATION OF INTRAOPERATIVE CT SCAN  N/A 05/17/2017   Procedure: APPLICATION OF INTRAOPERATIVE CT SCAN;  Surgeon: Erline Levine, MD;  Location: [place];  Service: Neurosurgery;  Laterality: N/A;  . BACK SURGERY     x 5  . BREAST SURGERY     x 2 biopsies  . CARPAL TUNNEL RELEASE  RIGHT - 2001  & DEC 2011 W/ BACK SURG.  . CERVICAL DISC SURGERY  2005   C5 - 6  . CHOLECYSTECTOMY  1994  . DORSAL COMPARTMENT RELEASE Right 04/07/2013   Procedure: RIGHT WRIST STS RELEASE;  Surgeon: Schuyler Amor, MD;  Location: [place];  Service: Orthopedics;  Laterality: Right;  . EXPLORATION OF INCISION FOR CSF LEAK  DEC 2011  X2   POST LAMINECOTMY  . FINGER ARTHRODESIS Right 04/07/2013   Procedure: RIGHT INDEX AND RIGHT LONG DISTAL INTERPHALANGEAL JOINT FUSIONS;  Surgeon: Schuyler Amor, MD;  Location: [place];  Service: Orthopedics;  Laterality: Right;  . FINGER ARTHROPLASTY Right 04/07/2013   Procedure: RIGHT THUMB [place] ARTHROPLASTY;  Surgeon: Schuyler Amor, MD;  Location: [place];  Service: Orthopedics;  Laterality: Right;  . GANGLION CYST EXCISION Right 04/07/2013   Procedure: RIGHT WRIST MASS EXCISION;  Surgeon: Schuyler Amor, MD;  Location: [place];  Service: Orthopedics;  Laterality: Right;  . KNEE ARTHROSCOPY  LEFT X3 (LAST ONE 2005)  . KNEE ARTHROSCOPY  05/17/2011   Procedure: ARTHROSCOPY KNEE;  Surgeon: Bradley Ferris III;  Location: [place];  Service: Orthopedics;  Laterality: Right;  WITH MEDIAL MENISECTOMY AND removal of suprapatella fat lump  . LEFT WRIST TENOSYNECTOMY W/ LEFT THUMB JOINT REPAIR  02-24-10  . LUMBAR FUSION  11-30-10   L4 - 5  . LUMBAR FUSION  2000   L2 - 4  . LUMBAR LAMINECTOMY  DEC 2011   L4 - 5  . POSTERIOR CERVICAL FUSION/FORAMINOTOMY N/A 05/17/2017   Procedure: Cervical five  to Thorastic one  Posterior cervical fusion with lateral mass screws/revision of prior instrumentation;  Surgeon: Erline Levine, MD;  Location: [place];  Service: Neurosurgery;  Laterality: N/A;  C5 to T1 Posterior cervical fusion with lateral mass screws/revision of prior instrumentation  . TENDON REPAIR  JAN 2010   LEFT INDEX AND LONG FINGERS  . TUBAL LIGATION     Past Medical History:  Diagnosis Date  . Acute sinusitis, unspecified   . Anemia    "quite a few times"  . Anxiety state, unspecified   . Arachnoiditis BILATERAL LEGS   DUE TO MULTIPLE BACK SURG.'S  . Arthritis   . Asthma    last flare up was 03/2017  lasted over a month  . Blood transfusion   . Cancer ([place])    skin cancers (in scalp)  . Cardiomyopathy HX --06/2010   EF was 25% during acute illness (PHELONEPHRITIS) Repeat echo 12-06-10 60% showed normal EF.   Marland Kitchen Chronic back pain greater than 3 months duration    S/P BACK SURG'S  . CSF leak   . Diabetes mellitus without complication ([place])    dx 2013 type 2  . Dyslipidemia   . Dysphagia    some post op cerv fusion 2/14  . Dysrhythmia   . Essential hypertension, benign   . Family history of adverse reaction to anesthesia    mother gets n/v  . GERD (gastroesophageal reflux disease) AND HIATIAL HERNIA   CONTROLLED W/ NEXIUM  . Headache(784.0)   . Heart murmur  DENIES S & S   (ECHO JUN'12 W/ CHART)  . History of chronic bronchitis   . Hx of bladder infections   . Hypertension   . Murmur, heart   . Neuromuscular disorder (HCC)    numbness and tingling  . Osteoporosis   . Other malaise and fatigue   . PONV (postoperative nausea and vomiting)   . Shortness of breath   . Spinal headache   . Spinal stenosis, cervical region   . Varicosities    venous  . Weakness of both legs DUE TO ARACHNOIDITIS   OCCASIONAL USES CANE   BP 135/85   Pulse 85   SpO2 93%   Opioid Risk Score:  2 Fall Risk Score:  `1  Depression screen PHQ 2/9  Depression screen [facility] 2/9 07/02/2017 06/04/2017 05/03/2017 03/05/2017 01/05/2017 01/20/2016 12/20/2015  Decreased Interest 1 1 0 0 0 0 0  Down, Depressed, Hopeless 1 1 0 0 0 0 0  PHQ - 2 Score 2 2 0 0 0 0 0  Altered sleeping - - - - - - -  Tired, decreased energy - - - - - - -  Change in appetite - - - - - - -  Feeling bad or failure about yourself  - - - - - - -  Trouble concentrating - - - - - - -  Moving slowly or fidgety/restless - - - - - - -  Suicidal thoughts - - - - - - -  PHQ-9 Score - - - - - - -  Some recent data might be hidden     Review of Systems  Constitutional: Negative.   HENT: Negative.   Eyes: Negative.   Respiratory:  Negative.   Cardiovascular: Negative.   Gastrointestinal: Negative.   Endocrine: Negative.   Genitourinary: Negative.   Musculoskeletal: Negative.   Skin: Negative.   Allergic/Immunologic: Negative.   Neurological: Negative.   Hematological: Negative.   Psychiatric/Behavioral: Negative.   All other systems reviewed and are negative.      Objective:   Physical Exam  Constitutional: She is oriented to person, place, and time. She appears well-developed and well-nourished.  HENT:  Head: Normocephalic and atraumatic.  Neck: Normal range of motion. Neck supple.  Cervical Paraspinal Tenderness: C-5-C-6/ Wearing Cervical Collar/ Dressing Intact: Site pink with no drainage or odor. New Dressing applied  Cardiovascular: Normal rate and regular rhythm.  Pulmonary/Chest: Effort normal and breath sounds normal.  Musculoskeletal:  Normal Muscle Bulk and Muscle Testing Reveals: Upper Extremities: Decreased ROM 90 Degrees and Muscle Strength 4/5 Thoracic Hypersensitivity Lumbar Hypersensitivity Right Greater Trochanter [place]s Lower Extremities: Full ROM and Muscle Strength 5/5 Arises from Table slowly using straight cane for support Antalgic Gait  Neurological: She is alert and oriented to person, place, and time.  Skin: Skin is warm and dry.  Psychiatric: She has a normal mood and affect.  Nursing note and vitals reviewed.         Assessment & Plan:  1. On 05/17/2017 :C5C6C7 T1 Posterior Cervical Fusion with lateral mass fixation with AIRO Imaging, revision of prior instrumentation. By Dr. Vertell Limber.    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 Continue Gabapentin. 07/02/2017 Refilled:Oxycodone 15mg  one tablet every 6 hours as needed # 120 and  Oxymorphone HCL 40 mg every 12 hours #60. We will continue the opioid monitoring program, this consists of regular clinic visits, examinations, urine drug screen, pill counts as well as use of [place] Controlled Substance  Reporting System. 2. 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 May take noon dose when pain is sever, instructions given. Instructed to call in a week, she verbalizes understanding. We will continue Gabapentin 600mg  QID/ Alternate Days. Continue to Monitor. 07/02/2017 3. Anxiety/depression: Continue Valium, PCP Following. Continue Effexor. Continue  To monitor. 07/02/2017 4. Muscle Spasms: Continue Flexeril. Continue to Monitor. 07/02/2017 5. Cervicalgia/ Cervical Radiculitis: Continue Gabapentin: Dr. Vertell Limber Following: S/P  C5C6C7 T1 Posterior Cervical Fusion with lateral mass fixation with AIRO Imaging, revision of prior instrumentation by Dr. Vertell Limber on 05/17/2017. 07/02/2017  20 minutes of face to face patient care time was spent during this visit. All questions were encouraged and answered.  F/U in 1 month

## 2017-07-09 DIAGNOSIS — Z6833 Body mass index (BMI) 33.0-33.9, adult: Secondary | ICD-10-CM | POA: Diagnosis not present

## 2017-07-09 DIAGNOSIS — M5412 Radiculopathy, cervical region: Secondary | ICD-10-CM | POA: Diagnosis not present

## 2017-07-09 DIAGNOSIS — M4802 Spinal stenosis, cervical region: Secondary | ICD-10-CM | POA: Diagnosis not present

## 2017-07-09 DIAGNOSIS — M542 Cervicalgia: Secondary | ICD-10-CM | POA: Diagnosis not present

## 2017-07-09 DIAGNOSIS — I10 Essential (primary) hypertension: Secondary | ICD-10-CM | POA: Diagnosis not present

## 2017-07-09 DIAGNOSIS — G959 Disease of spinal cord, unspecified: Secondary | ICD-10-CM | POA: Diagnosis not present

## 2017-07-13 DIAGNOSIS — M19072 Primary osteoarthritis, left ankle and foot: Secondary | ICD-10-CM | POA: Diagnosis not present

## 2017-07-13 DIAGNOSIS — M12872 Other specific arthropathies, not elsewhere classified, left ankle and foot: Secondary | ICD-10-CM | POA: Diagnosis not present

## 2017-07-24 ENCOUNTER — Telehealth: Payer: Self-pay | Admitting: Internal Medicine

## 2017-07-24 MED ORDER — PREDNISONE 10 MG PO TABS: ORAL_TABLET | ORAL | 0 refills | Status: DC

## 2017-07-24 MED ORDER — DOXYCYCLINE HYCLATE 100 MG PO TABS: 100.0000 mg | ORAL_TABLET | Freq: Two times a day (BID) | ORAL | 0 refills | Status: DC

## 2017-07-24 NOTE — Telephone Encounter (Signed)
Please take prednisone 40 mg x1 day, then 30 mg x1 day, then 20 mg x1 day, then 10 mg x1 day, and then 5 mg x1 day and stop   Take doxycycline 100mg  po twice daily x 5 days; take after meals and avoid sunlight  Rx above for acute bronchitis   Allergies  Allergen Reactions  . Latex Itching and Rash  . Morphine And Related Hives and Itching  . Aspirin Nausea And Vomiting    Can take coated asa  . Biaxin [Clarithromycin] Nausea And Vomiting  . Clarithromycin Diarrhea  . Codeine Itching  . Darvocet [Propoxyphene N-Acetaminophen] Itching  . Hydrocodone-Acetaminophen Itching  . Lortab [Hydrocodone-Acetaminophen] Itching  . Percocet [Oxycodone-Acetaminophen] Itching

## 2017-07-24 NOTE — Telephone Encounter (Signed)
Spoke with patient. She stated that she has not being feeling for the past few days. She had to take her mother to a rehab facility and feels that she may have come into contact with a bug.   She has a sore throat, increased SOB, chest tightness and a productive cough with thick, clear mucus. States that she did feel feverish this morning when she woke up.   She is requesting prednisone.   She wishes to use [employer].   MR, please advise. Thanks!

## 2017-07-24 NOTE — Telephone Encounter (Signed)
Patient calling to check on rx - patient can be reached at [phone] -pr

## 2017-07-24 NOTE — Telephone Encounter (Signed)
Spoke with pt, advised that we are still awaiting MR's recs but advised that we would be calling her before we leave today with recs.  Pt expressed understanding.    MR please advise on recs.  Thanks!

## 2017-07-24 NOTE — Telephone Encounter (Signed)
Called and spoke with patient, advised her of MR recommendations, nothing further needed.

## 2017-08-01 ENCOUNTER — Encounter: Payer: Self-pay | Admitting: Physical Medicine & Rehabilitation

## 2017-08-01 ENCOUNTER — Encounter: Payer: Medicare Other | Attending: Physical Medicine & Rehabilitation | Admitting: Physical Medicine & Rehabilitation

## 2017-08-01 VITALS — BP 102/65 | HR 89

## 2017-08-01 DIAGNOSIS — G039 Meningitis, unspecified: Secondary | ICD-10-CM | POA: Diagnosis not present

## 2017-08-01 DIAGNOSIS — M5412 Radiculopathy, cervical region: Secondary | ICD-10-CM

## 2017-08-01 DIAGNOSIS — M5441 Lumbago with sciatica, right side: Secondary | ICD-10-CM | POA: Insufficient documentation

## 2017-08-01 DIAGNOSIS — Z79899 Other long term (current) drug therapy: Secondary | ICD-10-CM | POA: Diagnosis not present

## 2017-08-01 DIAGNOSIS — Z5181 Encounter for therapeutic drug level monitoring: Secondary | ICD-10-CM | POA: Insufficient documentation

## 2017-08-01 DIAGNOSIS — M961 Postlaminectomy syndrome, not elsewhere classified: Secondary | ICD-10-CM

## 2017-08-01 MED ORDER — OXYMORPHONE HCL ER 40 MG PO T12A: 40.0000 mg | EXTENDED_RELEASE_TABLET | Freq: Two times a day (BID) | ORAL | 0 refills | Status: DC

## 2017-08-01 MED ORDER — OXYCODONE HCL 15 MG PO TABS: 15.0000 mg | ORAL_TABLET | Freq: Four times a day (QID) | ORAL | 0 refills | Status: DC | PRN

## 2017-08-01 NOTE — Progress Notes (Signed)
Subjective:    Patient ID: [name] [name], female    DOB: 25-Mar-1951, 67 y.o.   MRN: [id number]  HPI  [name] [name] is here in follow up of her chronic pain. She had cervical fusion surgery in late November. She is still wearing her aspen collar which has given her ongoing support and relief. She tried to go to a soft cervical collar a few weeks ago, but her pain flared. Overall, she reports decreased referred pain into her shoulders, and hands. Neck pain is somewhat better. She hasn't seen a change in strength but numbness seems better.   Pain Inventory Average Pain 7 Pain Right Now 7 My pain is sharp, burning, tingling and aching  In the last 24 hours, has pain interfered with the following? General activity 7 Relation with others 7 Enjoyment of life 7 What TIME of day is your pain at its worst? . Sleep (in general) Poor  Pain is worse with: walking, bending, sitting, standing and some activites Pain improves with: rest, heat/ice and medication Relief from Meds: 7  Mobility use a walker  Function Do you have any goals in this area?  no  Neuro/Psych No problems in this area  Prior Studies Any changes since last visit?  no  Physicians involved in your care Any changes since last visit?  no   Family History  Problem Relation Age of Onset  . Hypertension Mother   . Heart disease Mother   . Diabetes Mother   . Cancer Father   . Diabetes Brother    Social History   Socioeconomic History  . Marital status: Divorced    Spouse name: Not on file  . Number of children: Not on file  . Years of education: Not on file  . Highest education level: Not on file  Social Needs  . Financial resource strain: Not on file  . Food insecurity - worry: Not on file  . Food insecurity - inability: Not on file  . Transportation needs - medical: Not on file  . Transportation needs - non-medical: Not on file  Occupational History  . Occupation: disable    Employer: DISABLED  Tobacco  Use  . Smoking status: Never Smoker  . Smokeless tobacco: Never Used  Substance and Sexual Activity  . Alcohol use: No  . Drug use: No  . Sexual activity: Not on file    Comment: second hand smoke  Other Topics Concern  . Not on file  Social History Narrative   Has one child   Disabled   Past Surgical History:  Procedure Laterality Date  . ABDOMINAL HYSTERECTOMY  1987   W/ BSO  . ANTERIOR CERVICAL DECOMP/DISCECTOMY FUSION N/A 08/09/2012   Procedure: ANTERIOR CERVICAL DECOMPRESSION/DISCECTOMY FUSION 1 LEVEL;  Surgeon: Floyce Stakes, MD;  Location: [facility];  Service: Neurosurgery;  Laterality: N/A;  Cervical four-five  Anterior cervical decompression/diskectomy/fusion  . ANTERIOR FUSION CERVICAL SPINE  2007   C5 -6  . APPENDECTOMY    . APPLICATION OF INTRAOPERATIVE CT SCAN N/A 05/17/2017   Procedure: APPLICATION OF INTRAOPERATIVE CT SCAN;  Surgeon: Erline Levine, MD;  Location: [place];  Service: Neurosurgery;  Laterality: N/A;  . BACK SURGERY     x 5  . BREAST SURGERY     x 2 biopsies  . CARPAL TUNNEL RELEASE  RIGHT - 2001  & DEC 2011 W/ BACK SURG.  . CERVICAL DISC SURGERY  2005   C5 - 6  . CHOLECYSTECTOMY  1994  .  DORSAL COMPARTMENT RELEASE Right 04/07/2013   Procedure: RIGHT WRIST STS RELEASE;  Surgeon: Schuyler Amor, MD;  Location: [place];  Service: Orthopedics;  Laterality: Right;  . EXPLORATION OF INCISION FOR CSF LEAK  DEC 2011  X2   POST LAMINECOTMY  . FINGER ARTHRODESIS Right 04/07/2013   Procedure: RIGHT INDEX AND RIGHT LONG DISTAL INTERPHALANGEAL JOINT FUSIONS;  Surgeon: Schuyler Amor, MD;  Location: [place];  Service: Orthopedics;  Laterality: Right;  . FINGER ARTHROPLASTY Right 04/07/2013   Procedure: RIGHT THUMB [place] ARTHROPLASTY;  Surgeon: Schuyler Amor, MD;  Location: [place];  Service: Orthopedics;  Laterality: Right;  . GANGLION CYST EXCISION Right 04/07/2013   Procedure: RIGHT WRIST  MASS EXCISION;  Surgeon: Schuyler Amor, MD;  Location: [place];  Service: Orthopedics;  Laterality: Right;  . KNEE ARTHROSCOPY  LEFT X3 (LAST ONE 2005)  . KNEE ARTHROSCOPY  05/17/2011   Procedure: ARTHROSCOPY KNEE;  Surgeon: Bradley Ferris III;  Location: [place];  Service: Orthopedics;  Laterality: Right;  WITH MEDIAL MENISECTOMY AND removal of suprapatella fat lump  . LEFT WRIST TENOSYNECTOMY W/ LEFT THUMB JOINT REPAIR  02-24-10  . LUMBAR FUSION  11-30-10   L4 - 5  . LUMBAR FUSION  2000   L2 - 4  . LUMBAR LAMINECTOMY  DEC 2011   L4 - 5  . POSTERIOR CERVICAL FUSION/FORAMINOTOMY N/A 05/17/2017   Procedure: Cervical five  to Thorastic one  Posterior cervical fusion with lateral mass screws/revision of prior instrumentation;  Surgeon: Erline Levine, MD;  Location: [place];  Service: Neurosurgery;  Laterality: N/A;  C5 to T1 Posterior cervical fusion with lateral mass screws/revision of prior instrumentation  . TENDON REPAIR  JAN 2010   LEFT INDEX AND LONG FINGERS  . TUBAL LIGATION     Past Medical History:  Diagnosis Date  . Acute sinusitis, unspecified   . Anemia    "quite a few times"  . Anxiety state, unspecified   . Arachnoiditis BILATERAL LEGS   DUE TO MULTIPLE BACK SURG.'S  . Arthritis   . Asthma    last flare up was 03/2017 lasted over a month  . Blood transfusion   . Cancer ([place])    skin cancers (in scalp)  . Cardiomyopathy HX --06/2010   EF was 25% during acute illness (PHELONEPHRITIS) Repeat echo 12-06-10 60% showed normal EF.   Marland Kitchen Chronic back pain greater than 3 months duration    S/P BACK SURG'S  . CSF leak   . Diabetes mellitus without complication ([place])    dx 2013 type 2  . Dyslipidemia   . Dysphagia    some post op cerv fusion 2/14  . Dysrhythmia   . Essential hypertension, benign   . Family history of adverse reaction to anesthesia    mother gets n/v  . GERD (gastroesophageal reflux disease) AND HIATIAL HERNIA    CONTROLLED W/ NEXIUM  . Headache(784.0)   . Heart murmur    DENIES S & S   (ECHO JUN'12 W/ CHART)  . History of chronic bronchitis   . Hx of bladder infections   . Hypertension   . Murmur, heart   . Neuromuscular disorder (HCC)    numbness and tingling  . Osteoporosis   . Other malaise and fatigue   . PONV (postoperative nausea and vomiting)   . Shortness of breath   . Spinal headache   . Spinal stenosis, cervical region   .  Varicosities    venous  . Weakness of both legs DUE TO ARACHNOIDITIS   OCCASIONAL USES CANE   There were no vitals taken for this visit.  Opioid Risk Score:   Fall Risk Score:  `1  Depression screen PHQ 2/9  Depression screen [facility] 2/9 07/02/2017 06/04/2017 05/03/2017 03/05/2017 01/05/2017 01/20/2016 12/20/2015  Decreased Interest 1 1 0 0 0 0 0  Down, Depressed, Hopeless 1 1 0 0 0 0 0  PHQ - 2 Score 2 2 0 0 0 0 0  Altered sleeping - - - - - - -  Tired, decreased energy - - - - - - -  Change in appetite - - - - - - -  Feeling bad or failure about yourself  - - - - - - -  Trouble concentrating - - - - - - -  Moving slowly or fidgety/restless - - - - - - -  Suicidal thoughts - - - - - - -  PHQ-9 Score - - - - - - -  Some recent data might be hidden     Review of Systems  Constitutional: Negative.   HENT: Negative.   Eyes: Negative.   Respiratory: Negative.   Cardiovascular: Negative.   Gastrointestinal: Negative.   Endocrine: Negative.   Genitourinary: Negative.   Musculoskeletal: Negative.   Skin: Negative.   Allergic/Immunologic: Negative.   Neurological: Negative.   Hematological: Negative.   Psychiatric/Behavioral: Negative.   All other systems reviewed and are negative.      Objective:   Physical Exam  Constitutional: She is oriented to person, place, and time. She appears well-developed and well-nourished.  HENT:  Head: Normocephalic and atraumatic.  Neck: Normal range of motion. Neck supple.  Cervical collar in place.    Cardiovascular: Normal rate and regular rhythm.  Pulmonary/Chest: Effort normal and breath sounds normal.  Musculoskeletal:   Neurological: She is alert and oriented to person, place, and time.  UE Motor: 4/5 shoulders, biceps, triceps, 3+ to 4- HI Balance functional  LE: 4- to 4/5 Skin: Skin is warm and dry.  Psychiatric: in good spirits.         Assessment & Plan:  1. On 05/17/2017 :C5C6C7 T1 Posterior Cervical Fusion with lateral mass fixation with AIRO Imaging, revision of prior instrumentation. By Dr. Vertell Limber.      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    -Continue Gabapentin. 07/02/2017    -refilled Oxycodone 15mg  one tablet every 6 hours as needed # 120    -Oxymorphone HCL 40 mg every 12 hours #60.   -We will continue the opioid monitoring program, this consists of regular clinic visits, examinations, routine drug screening, pill counts as well as use of [place] Controlled Substance Reporting System. [place].   2. 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 May take noon dose when pain is sever, instructions given. Instructed to call in a week, she verbalizes understanding. We will continue Gabapentin 600mg  QID/ Alternate Days. Continue to Monitor.   3. Anxiety/depression: Continue Valium, PCP Following. Continue Effexor. Continue  To monitor. 07/02/2017 4. Muscle Spasms: Continue Flexeril. Continue to Monitor. 07/02/2017    15 minutes of face to face patient care time was spent during this visit. All questions were encouraged and answered.  F/U in 1 month with NP

## 2017-08-01 NOTE — Patient Instructions (Signed)
PLEASE FEEL FREE TO CALL OUR OFFICE WITH ANY PROBLEMS OR QUESTIONS ([phone])      

## 2017-08-02 ENCOUNTER — Ambulatory Visit: Payer: Medicare Other | Admitting: Registered Nurse

## 2017-08-02 DIAGNOSIS — M810 Age-related osteoporosis without current pathological fracture: Secondary | ICD-10-CM | POA: Diagnosis present

## 2017-08-02 DIAGNOSIS — J168 Pneumonia due to other specified infectious organisms: Secondary | ICD-10-CM | POA: Diagnosis not present

## 2017-08-02 DIAGNOSIS — J189 Pneumonia, unspecified organism: Secondary | ICD-10-CM | POA: Diagnosis not present

## 2017-08-02 DIAGNOSIS — E871 Hypo-osmolality and hyponatremia: Secondary | ICD-10-CM | POA: Diagnosis present

## 2017-08-02 DIAGNOSIS — M545 Low back pain: Secondary | ICD-10-CM | POA: Diagnosis present

## 2017-08-02 DIAGNOSIS — Z886 Allergy status to analgesic agent status: Secondary | ICD-10-CM | POA: Diagnosis not present

## 2017-08-02 DIAGNOSIS — Z79891 Long term (current) use of opiate analgesic: Secondary | ICD-10-CM | POA: Diagnosis not present

## 2017-08-02 DIAGNOSIS — Z8249 Family history of ischemic heart disease and other diseases of the circulatory system: Secondary | ICD-10-CM | POA: Diagnosis not present

## 2017-08-02 DIAGNOSIS — K219 Gastro-esophageal reflux disease without esophagitis: Secondary | ICD-10-CM | POA: Diagnosis present

## 2017-08-02 DIAGNOSIS — Z7982 Long term (current) use of aspirin: Secondary | ICD-10-CM | POA: Diagnosis not present

## 2017-08-02 DIAGNOSIS — S3991XA Unspecified injury of abdomen, initial encounter: Secondary | ICD-10-CM | POA: Diagnosis not present

## 2017-08-02 DIAGNOSIS — N178 Other acute kidney failure: Secondary | ICD-10-CM | POA: Diagnosis not present

## 2017-08-02 DIAGNOSIS — J45901 Unspecified asthma with (acute) exacerbation: Secondary | ICD-10-CM | POA: Diagnosis present

## 2017-08-02 DIAGNOSIS — Z800 Family history of malignant neoplasm of digestive organs: Secondary | ICD-10-CM | POA: Diagnosis not present

## 2017-08-02 DIAGNOSIS — S1982XA Other specified injuries of cervical trachea, initial encounter: Secondary | ICD-10-CM | POA: Diagnosis not present

## 2017-08-02 DIAGNOSIS — A4189 Other specified sepsis: Secondary | ICD-10-CM | POA: Diagnosis not present

## 2017-08-02 DIAGNOSIS — G8929 Other chronic pain: Secondary | ICD-10-CM | POA: Diagnosis present

## 2017-08-02 DIAGNOSIS — Z79899 Other long term (current) drug therapy: Secondary | ICD-10-CM | POA: Diagnosis not present

## 2017-08-02 DIAGNOSIS — R05 Cough: Secondary | ICD-10-CM | POA: Diagnosis not present

## 2017-08-02 DIAGNOSIS — R531 Weakness: Secondary | ICD-10-CM | POA: Diagnosis not present

## 2017-08-02 DIAGNOSIS — S0990XA Unspecified injury of head, initial encounter: Secondary | ICD-10-CM | POA: Diagnosis not present

## 2017-08-02 DIAGNOSIS — E119 Type 2 diabetes mellitus without complications: Secondary | ICD-10-CM | POA: Diagnosis not present

## 2017-08-02 DIAGNOSIS — R0902 Hypoxemia: Secondary | ICD-10-CM | POA: Diagnosis present

## 2017-08-02 DIAGNOSIS — I10 Essential (primary) hypertension: Secondary | ICD-10-CM | POA: Diagnosis present

## 2017-08-02 DIAGNOSIS — N179 Acute kidney failure, unspecified: Secondary | ICD-10-CM | POA: Diagnosis present

## 2017-08-02 DIAGNOSIS — J45998 Other asthma: Secondary | ICD-10-CM | POA: Diagnosis not present

## 2017-08-02 DIAGNOSIS — R748 Abnormal levels of other serum enzymes: Secondary | ICD-10-CM | POA: Diagnosis present

## 2017-08-02 DIAGNOSIS — A419 Sepsis, unspecified organism: Secondary | ICD-10-CM | POA: Diagnosis not present

## 2017-08-15 DIAGNOSIS — Z1389 Encounter for screening for other disorder: Secondary | ICD-10-CM | POA: Diagnosis not present

## 2017-08-15 DIAGNOSIS — J168 Pneumonia due to other specified infectious organisms: Secondary | ICD-10-CM | POA: Diagnosis not present

## 2017-08-15 DIAGNOSIS — Z0000 Encounter for general adult medical examination without abnormal findings: Secondary | ICD-10-CM | POA: Diagnosis not present

## 2017-08-16 ENCOUNTER — Other Ambulatory Visit: Payer: Self-pay | Admitting: Internal Medicine

## 2017-08-16 DIAGNOSIS — Z1231 Encounter for screening mammogram for malignant neoplasm of breast: Secondary | ICD-10-CM

## 2017-08-30 ENCOUNTER — Encounter: Payer: Self-pay | Admitting: Internal Medicine

## 2017-08-30 ENCOUNTER — Encounter: Payer: Self-pay | Admitting: Registered Nurse

## 2017-08-30 ENCOUNTER — Other Ambulatory Visit (INDEPENDENT_AMBULATORY_CARE_PROVIDER_SITE_OTHER): Payer: Medicare Other

## 2017-08-30 ENCOUNTER — Ambulatory Visit (INDEPENDENT_AMBULATORY_CARE_PROVIDER_SITE_OTHER): Payer: Medicare Other | Admitting: Internal Medicine

## 2017-08-30 ENCOUNTER — Telehealth: Payer: Self-pay | Admitting: Internal Medicine

## 2017-08-30 ENCOUNTER — Ambulatory Visit (INDEPENDENT_AMBULATORY_CARE_PROVIDER_SITE_OTHER)
Admission: RE | Admit: 2017-08-30 | Discharge: 2017-08-30 | Disposition: A | Discharge status: Home / Self Care | Payer: Medicare Other | Source: Ambulatory Visit | Attending: Internal Medicine | Admitting: Internal Medicine

## 2017-08-30 ENCOUNTER — Encounter: Payer: Medicare Other | Attending: Physical Medicine & Rehabilitation | Admitting: Registered Nurse

## 2017-08-30 VITALS — BP 126/78 | HR 91 | Ht 65.0 in | Wt 187.6 lb

## 2017-08-30 VITALS — BP 114/72 | HR 90 | Resp 14

## 2017-08-30 DIAGNOSIS — Z8701 Personal history of pneumonia (recurrent): Secondary | ICD-10-CM | POA: Diagnosis not present

## 2017-08-30 DIAGNOSIS — J4540 Moderate persistent asthma, uncomplicated: Secondary | ICD-10-CM

## 2017-08-30 DIAGNOSIS — R296 Repeated falls: Secondary | ICD-10-CM

## 2017-08-30 DIAGNOSIS — R05 Cough: Secondary | ICD-10-CM | POA: Diagnosis not present

## 2017-08-30 DIAGNOSIS — M5441 Lumbago with sciatica, right side: Secondary | ICD-10-CM | POA: Insufficient documentation

## 2017-08-30 DIAGNOSIS — M5412 Radiculopathy, cervical region: Secondary | ICD-10-CM

## 2017-08-30 DIAGNOSIS — R0989 Other specified symptoms and signs involving the circulatory and respiratory systems: Secondary | ICD-10-CM

## 2017-08-30 DIAGNOSIS — G894 Chronic pain syndrome: Secondary | ICD-10-CM

## 2017-08-30 DIAGNOSIS — R5381 Other malaise: Secondary | ICD-10-CM | POA: Diagnosis not present

## 2017-08-30 DIAGNOSIS — M5416 Radiculopathy, lumbar region: Secondary | ICD-10-CM | POA: Diagnosis not present

## 2017-08-30 DIAGNOSIS — G039 Meningitis, unspecified: Secondary | ICD-10-CM | POA: Diagnosis not present

## 2017-08-30 DIAGNOSIS — M961 Postlaminectomy syndrome, not elsewhere classified: Secondary | ICD-10-CM

## 2017-08-30 DIAGNOSIS — Z862 Personal history of diseases of the blood and blood-forming organs and certain disorders involving the immune mechanism: Secondary | ICD-10-CM | POA: Diagnosis not present

## 2017-08-30 DIAGNOSIS — Z79899 Other long term (current) drug therapy: Secondary | ICD-10-CM | POA: Diagnosis not present

## 2017-08-30 DIAGNOSIS — Z5181 Encounter for therapeutic drug level monitoring: Secondary | ICD-10-CM | POA: Diagnosis not present

## 2017-08-30 DIAGNOSIS — M542 Cervicalgia: Secondary | ICD-10-CM | POA: Diagnosis not present

## 2017-08-30 LAB — CBC WITH DIFFERENTIAL/PLATELET
BASOS ABS: 0.1 10*3/uL (ref 0.0–0.1)
Basophils Relative: 1.2 % (ref 0.0–3.0)
EOS PCT: 2.3 % (ref 0.0–5.0)
Eosinophils Absolute: 0.2 10*3/uL (ref 0.0–0.7)
HCT: 36.5 % (ref 36.0–46.0)
Hemoglobin: 11.9 g/dL — ABNORMAL LOW (ref 12.0–15.0)
LYMPHS PCT: 28 % (ref 12.0–46.0)
Lymphs Abs: 2.8 10*3/uL (ref 0.7–4.0)
MCHC: 32.5 g/dL (ref 30.0–36.0)
MCV: 93.9 fl (ref 78.0–100.0)
MONO ABS: 0.5 10*3/uL (ref 0.1–1.0)
Monocytes Relative: 5.4 % (ref 3.0–12.0)
Neutro Abs: 6.3 10*3/uL (ref 1.4–7.7)
Neutrophils Relative %: 63.1 % (ref 43.0–77.0)
Platelets: 289 10*3/uL (ref 150.0–400.0)
RBC: 3.89 Mil/uL (ref 3.87–5.11)
RDW: 15 % (ref 11.5–15.5)
WBC: 9.9 10*3/uL (ref 4.0–10.5)

## 2017-08-30 MED ORDER — OXYCODONE HCL 15 MG PO TABS: 15.0000 mg | ORAL_TABLET | Freq: Four times a day (QID) | ORAL | 0 refills | Status: DC | PRN

## 2017-08-30 MED ORDER — OXYMORPHONE HCL ER 40 MG PO T12A: 40.0000 mg | EXTENDED_RELEASE_TABLET | Freq: Two times a day (BID) | ORAL | 0 refills | Status: DC

## 2017-08-30 MED ORDER — PREDNISONE 10 MG PO TABS: ORAL_TABLET | ORAL | 0 refills | Status: DC

## 2017-08-30 NOTE — Patient Instructions (Addendum)
ICD-10-CM   1. Moderate persistent asthma without complication E93.81 CBC w/Diff    DG Chest 2 View  2. History of bacterial pneumonia Z87.01   3. History of anemia Z86.2   4. Physical deconditioning R53.81   5. Bibasilar crackles R09.89     Do cxr 2 view Do CBC with diff  Plan  if xray looks good will callin 8d prednisone for cough Continue symbicort scheduled Continue getting stronger  Followup  6 months or sooner if needed

## 2017-08-30 NOTE — Telephone Encounter (Signed)
Patient aware , nothing further needed.  °

## 2017-08-30 NOTE — Progress Notes (Signed)
Subjective:    Patient ID: [name] [name], female    DOB: 21-Jan-1951, 67 y.o.   MRN: [id number]  HPI:  [name] [name] is a 67year old female who returns for follow up appointmentfor chronic pain and medication refill. She states her pain is located in her neck radiating into her right shoulder and right arm with tingling and burning, also reports lower back pain radiating into her left lower extremity. [name] [name] states her pain has intensified since her fall on 08/18/2017. She reports she was walking out of her kitchen into her dinning room, she believes her left lower extremity gave out. She landed on her back, her son helped her up. She didn't seek medical attention. She called Dr. Vertell Limber while in the office awaiting a return call, she refuses X-ray, she will wait on Dr. Vertell Limber return call she states. She rates her pain 7. Her current exercise regime is walking.  [name] [name] was admitted to [facility] from 08/02/2017- 08/05/2017, she states she was diagnosed with Pneumonia.   [name] [name] Morphine equivalent is 330.00.00 MME. She is also prescribed Diazepam by Dr. Amanda Pea. We have reviewed the black box warning  regarding using opioids and benzodiazepines. I highlighted the dangers of using these drugs together and discussed the adverse events including respiratory suppression, overdose, cognitive impairment and importance of  compliance with current regimen. She verbalizes understanding, we will continue to monitor and adjust as indicated.    [name] surgery was performed on 05/17/2017 by Dr. Vertell Limber . He performed C5C6C7 T1 Posterior Cervical Fusion with lateral mass fixation with AIRO Imaging, revision of prior instrumentation.   Last UDS was performed on 12/05/2016, it was consistent.   Pain Inventory Average Pain 7 Pain Right Now 7 My pain is sharp, burning, stabbing and tingling  In the last 24 hours, has pain interfered with the following? General activity 0 Relation with others  0 Enjoyment of life 0 What TIME of day is your pain at its worst? all Sleep (in general) Fair  Pain is worse with: walking, sitting and standing Pain improves with: rest, heat/ice and medication Relief from Meds: 6  Mobility walk without assistance Do you have any goals in this area?  no  Function Do you have any goals in this area?  no  Neuro/Psych No problems in this area  Prior Studies Any changes since last visit?  no  Physicians involved in your care Any changes since last visit?  no   Family History  Problem Relation Age of Onset  . Hypertension Mother   . Heart disease Mother   . Diabetes Mother   . Cancer Father   . Diabetes Brother    Social History   Socioeconomic History  . Marital status: Divorced    Spouse name: None  . Number of children: None  . Years of education: None  . Highest education level: None  Social Needs  . Financial resource strain: None  . Food insecurity - worry: None  . Food insecurity - inability: None  . Transportation needs - medical: None  . Transportation needs - non-medical: None  Occupational History  . Occupation: disable    Employer: DISABLED  Tobacco Use  . Smoking status: Never Smoker  . Smokeless tobacco: Never Used  Substance and Sexual Activity  . Alcohol use: No  . Drug use: No  . Sexual activity: None    Comment: second hand smoke  Other Topics Concern  . None  Social History  Narrative   Has one child   Disabled   Past Surgical History:  Procedure Laterality Date  . ABDOMINAL HYSTERECTOMY  1987   W/ BSO  . ANTERIOR CERVICAL DECOMP/DISCECTOMY FUSION N/A 08/09/2012   Procedure: ANTERIOR CERVICAL DECOMPRESSION/DISCECTOMY FUSION 1 LEVEL;  Surgeon: Floyce Stakes, MD;  Location: [facility];  Service: Neurosurgery;  Laterality: N/A;  Cervical four-five  Anterior cervical decompression/diskectomy/fusion  . ANTERIOR FUSION CERVICAL SPINE  2007   C5 -6  . APPENDECTOMY    . APPLICATION OF INTRAOPERATIVE  CT SCAN N/A 05/17/2017   Procedure: APPLICATION OF INTRAOPERATIVE CT SCAN;  Surgeon: Erline Levine, MD;  Location: [place];  Service: Neurosurgery;  Laterality: N/A;  . BACK SURGERY     x 5  . BREAST SURGERY     x 2 biopsies  . CARPAL TUNNEL RELEASE  RIGHT - 2001  & DEC 2011 W/ BACK SURG.  . CERVICAL DISC SURGERY  2005   C5 - 6  . CHOLECYSTECTOMY  1994  . DORSAL COMPARTMENT RELEASE Right 04/07/2013   Procedure: RIGHT WRIST STS RELEASE;  Surgeon: Schuyler Amor, MD;  Location: [place];  Service: Orthopedics;  Laterality: Right;  . EXPLORATION OF INCISION FOR CSF LEAK  DEC 2011  X2   POST LAMINECOTMY  . FINGER ARTHRODESIS Right 04/07/2013   Procedure: RIGHT INDEX AND RIGHT LONG DISTAL INTERPHALANGEAL JOINT FUSIONS;  Surgeon: Schuyler Amor, MD;  Location: [place];  Service: Orthopedics;  Laterality: Right;  . FINGER ARTHROPLASTY Right 04/07/2013   Procedure: RIGHT THUMB [place] ARTHROPLASTY;  Surgeon: Schuyler Amor, MD;  Location: [place];  Service: Orthopedics;  Laterality: Right;  . GANGLION CYST EXCISION Right 04/07/2013   Procedure: RIGHT WRIST MASS EXCISION;  Surgeon: Schuyler Amor, MD;  Location: [place];  Service: Orthopedics;  Laterality: Right;  . KNEE ARTHROSCOPY  LEFT X3 (LAST ONE 2005)  . KNEE ARTHROSCOPY  05/17/2011   Procedure: ARTHROSCOPY KNEE;  Surgeon: Bradley Ferris III;  Location: [place];  Service: Orthopedics;  Laterality: Right;  WITH MEDIAL MENISECTOMY AND removal of suprapatella fat lump  . LEFT WRIST TENOSYNECTOMY W/ LEFT THUMB JOINT REPAIR  02-24-10  . LUMBAR FUSION  11-30-10   L4 - 5  . LUMBAR FUSION  2000   L2 - 4  . LUMBAR LAMINECTOMY  DEC 2011   L4 - 5  . POSTERIOR CERVICAL FUSION/FORAMINOTOMY N/A 05/17/2017   Procedure: Cervical five  to Thorastic one  Posterior cervical fusion with lateral mass screws/revision of prior instrumentation;  Surgeon: Erline Levine, MD;  Location: [place];  Service: Neurosurgery;  Laterality: N/A;  C5 to T1 Posterior cervical fusion with lateral mass screws/revision of prior instrumentation  . TENDON REPAIR  JAN 2010   LEFT INDEX AND LONG FINGERS  . TUBAL LIGATION     Past Medical History:  Diagnosis Date  . Acute sinusitis, unspecified   . Anemia    "quite a few times"  . Anxiety state, unspecified   . Arachnoiditis BILATERAL LEGS   DUE TO MULTIPLE BACK SURG.'S  . Arthritis   . Asthma    last flare up was 03/2017 lasted over a month  . Blood transfusion   . Cancer ([place])    skin cancers (in scalp)  . Cardiomyopathy HX --06/2010   EF was 25% during acute illness (PHELONEPHRITIS) Repeat echo 12-06-10 60% showed normal EF.   Marland Kitchen Chronic back pain greater than 3  months duration    S/P BACK SURG'S  . CSF leak   . Diabetes mellitus without complication ([place])    dx 2013 type 2  . Dyslipidemia   . Dysphagia    some post op cerv fusion 2/14  . Dysrhythmia   . Essential hypertension, benign   . Family history of adverse reaction to anesthesia    mother gets n/v  . GERD (gastroesophageal reflux disease) AND HIATIAL HERNIA   CONTROLLED W/ NEXIUM  . Headache(784.0)   . Heart murmur    DENIES S & S   (ECHO JUN'12 W/ CHART)  . History of chronic bronchitis   . Hx of bladder infections   . Hypertension   . Murmur, heart   . Neuromuscular disorder (HCC)    numbness and tingling  . Osteoporosis   . Other malaise and fatigue   . PONV (postoperative nausea and vomiting)   . Shortness of breath   . Spinal headache   . Spinal stenosis, cervical region   . Varicosities    venous  . Weakness of both legs DUE TO ARACHNOIDITIS   OCCASIONAL USES CANE   BP 114/72 (BP Location: Right Arm, Patient Position: Sitting, Cuff Size: Normal)   Pulse 90   Resp 14   SpO2 92%   Opioid Risk Score:  2 Fall Risk Score:  `1  Depression screen PHQ 2/9  Depression screen [facility] 2/9 07/02/2017 06/04/2017 05/03/2017 03/05/2017  01/05/2017 01/20/2016 12/20/2015  Decreased Interest 1 1 0 0 0 0 0  Down, Depressed, Hopeless 1 1 0 0 0 0 0  PHQ - 2 Score 2 2 0 0 0 0 0  Altered sleeping - - - - - - -  Tired, decreased energy - - - - - - -  Change in appetite - - - - - - -  Feeling bad or failure about yourself  - - - - - - -  Trouble concentrating - - - - - - -  Moving slowly or fidgety/restless - - - - - - -  Suicidal thoughts - - - - - - -  PHQ-9 Score - - - - - - -  Some recent data might be hidden     Review of Systems  Constitutional: Negative.   HENT: Negative.   Eyes: Negative.   Respiratory: Negative.   Cardiovascular: Negative.   Gastrointestinal: Negative.   Endocrine: Negative.   Genitourinary: Negative.   Musculoskeletal: Positive for back pain, myalgias and neck pain.  Skin: Negative.   Allergic/Immunologic: Negative.   Neurological: Negative.   Hematological: Negative.   Psychiatric/Behavioral: Negative.   All other systems reviewed and are negative.      Objective:   Physical Exam  Constitutional: She is oriented to person, place, and time. She appears well-developed and well-nourished.  HENT:  Head: Normocephalic and atraumatic.  Neck: Normal range of motion. Neck supple.  Cervical Paraspinal Tenderness: C-5-C-6  Cardiovascular: Normal rate and regular rhythm.  Pulmonary/Chest: Effort normal and breath sounds normal.  Musculoskeletal:  Normal Muscle Bulk and Muscle Testing Reveals: Upper Extremities: Full ROM  and Muscle Strength 4/5 Bilateral AC Joint Tenderness Thoracic and Lumbar  Hypersensitivity Lower Extremities: Full ROM and Muscle Strength 5/5 Left Lower Extremity Flexion Produces Pain into Left Extremity Arises from Table slowly Antalgic Gait  Neurological: She is alert and oriented to person, place, and time.  Skin: Skin is warm and dry.  Psychiatric: She has a normal mood and affect.  Nursing note and  vitals reviewed.         Assessment & Plan:  1. On 05/17/2017  :C5C6C7 T1 Posterior Cervical Fusion with lateral mass fixation with AIRO Imaging, revision of prior instrumentation. By Dr. Vertell Limber.    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 Continue Gabapentin. 08/30/2017 Refilled:Oxycodone 15mg  one tablet every 6 hours as needed # 120 and  Oxymorphone HCL 40 mg every 12 hours #60. We will continue the opioid monitoring program, this consists of regular clinic visits, examinations, urine drug screen, pill counts as well as use of [place] Controlled Substance Reporting System. 2. 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 08/30/2017 3. Anxiety/depression: Continue Valium, PCP Following. Continue Effexor. Continue  To monitor. 08/30/2017 4. Muscle Spasms: Continue Flexeril. Continue to Monitor. 08/30/2017 5. Cervicalgia/ Cervical Radiculitis: Continue Gabapentin: Dr. Vertell Limber Following: S/P  C5C6C7 T1 Posterior Cervical Fusion with lateral mass fixation with AIRO Imaging, revision of prior instrumentation by Dr. Vertell Limber on 05/17/2017. 08/30/2017 6. Frequent Falls: Educated on Falls instructed to use her cane at all times.   40 minutes of face to face patient care time was spent during this visit. All questions were encouraged and answered.  F/U in 1 month

## 2017-08-30 NOTE — Telephone Encounter (Signed)
Let [name] [name] know that Pneumonia has resolved on cxr and hemoglobin better compared to last month  Plan Take prednisone 40 mg daily x 2 days, then 20mg  daily x 2 days, then 10mg  daily x 2 days, then 5mg  daily x 2 days and stop - for AE asthma  Dr. Brand Males, M.D., [facility].C.P Pulmonary and Critical Care Medicine Staff Physician, [place] Director - Interstitial Lung Disease  Program  [facility] [place] [facility] [place], [place], [address]  Pager: [phone], If no answer or between  15:00h - 7:00h: call [phone] Telephone: [phone]    PULMONARY No results for input(s): PHART, PCO2ART, PO2ART, HCO3, TCO2, O2SAT in the last 168 hours.  Invalid input(s): PCO2, PO2  CBC Recent Labs  Lab 08/30/17 1252  HGB 11.9*  HCT 36.5  WBC 9.9  PLT 289.0    COAGULATION No results for input(s): INR in the last 168 hours.  CARDIAC  No results for input(s): TROPONINI in the last 168 hours. No results for input(s): PROBNP in the last 168 hours.   CHEMISTRY No results for input(s): NA, K, CL, CO2, GLUCOSE, BUN, CREATININE, CALCIUM, MG, PHOS in the last 168 hours. CrCl cannot be calculated (Patient's most recent lab result is older than the maximum 21 days allowed.).   LIVER No results for input(s): AST, ALT, ALKPHOS, BILITOT, PROT, ALBUMIN, INR in the last 168 hours.   INFECTIOUS No results for input(s): LATICACIDVEN, PROCALCITON in the last 168 hours.   ENDOCRINE CBG (last 3)  No results for input(s): GLUCAP in the last 72 hours.       IMAGING x48h  - image(s) personally visualized  -   highlighted in bold Dg Chest 2 View  Result Date: 08/30/2017 CLINICAL DATA:  Cough EXAM: CHEST - 2 VIEW COMPARISON:  08/30/2017 FINDINGS: The heart size and mediastinal contours are within normal limits. There has been complete clearing of right upper lobe pneumonia. No new pulmonary consolidations or evidence of  CHF. No effusion or pneumothorax. Partially included cervical spinal fixation hardware is noted. The visualized skeletal structures are stable. IMPRESSION: Complete resolution of right upper lobe pneumonia since prior. No new focus of airspace disease. Electronically Signed   By: Ashley Royalty M.D.   On: 08/30/2017 15:51

## 2017-08-30 NOTE — Progress Notes (Signed)
Subjective:     Patient ID: [name] [name], female   DOB: 06-24-1950, 67 y.o.   MRN: [id number]  HPI    OV 04/18/2016  Chief Complaint  Patient presents with  . Follow-up    Pt states voice is raspy. Pt c/o of some congestion more in morning, no tightness, and SOB w/ and w/o excertions. Pt states cough comes and goes.     FU asthma - MCT positive - on symbicort FU 58mm lung nodules - passive smoking Chronic voice hoarseness nos  Seeing afer 1 year. In interim has seen others. In [place] at beach s/p syncope and near cardiac arrest due to dehydratiin per her hx. Since then not the same. Last week having right infrascapular pain that gets worse when she lifts her hand. Also last week or so increased hoarseness of voice is increased cough and wheezing production.She is demanding to have arepeat CT scan of chest for her 54mm nodules.She is extremely worried about cancer.   OV 02/27/2017  Chief Complaint  Patient presents with  . Follow-up    6 month f/u, moderate asthma, been doing ok but started wheezing at night when she lays down, allergies causing congestion    Follow-up asthma based on methacholine challenge test positivity and chronic hoarseness of voice  Last seen October 2017. She says for the last 7-8 months she's been having nocturnal wheezing that is waking up at night. But in the daytime she does not have any wheezing. She is also also having dysphagia that is chronic. She was referred to ENT particularly in the context of having to have C-spine surgery. Apparently her  Vocal cords were inflamed and he was then asked to take double dose of Protonix which seemed to help the cough but not fully. This is frustrating her. She struggling with understanding the pathophysiology of this. She's taking Symbicort and singulair regularly. She has lost 15 pounds of weight in the last few weeks intentionally according to her and so she does not think acid reflux is a problem.    asthma control  questionnaire shows that at night she is waking up a few times because of perceived asthma symptoms. When she wakes up she is asymptomatic and she is only very slightly limited in her activities by asthma. She is only experiencing very little shortness of breath because of asthma. She is wheezing only a little of the time at night but is using a lot of albuterol for rescue. 5 point average score is 1  feno is 25 ppb and normal 02/27/2017  She is asking for =- new issue of pre op clearance prior to C spine surgery next month   OV 08/30/2017   Chief Complaint  Patient presents with  . Follow-up    Pt states she was in the hospital February 13 in [place] due to pna.  Pt does have current complaints of cough, fatigue, hoarseness, and chest tightness. States she is unsure if she is fully over the pna.      Follow-up asthma based on methacholine challenge test positivity and chronic hoarseness of voice  67 year old female follow-up asthma based on methacholine challenge test and chronic hoarseness of voice.  Last seen fall 2018.  After that she tells me that in February 2019 she got hospitalized after suddenly taking ill.  She shows a blood gas results from the outside showing hypoxemia without hypercapnia.  She was not intubated or treated with BiPAP.  She was treated in the medical floor  with oxygen and antibiotics.  She says she had hospital-acquired delirium and was diagnosed with right upper lobe pneumonia.  She was then discharged after a few days.  Since then she is improving slowly but having significant fatigue review of the lab results from [facility] also shows anemia but otherwise chemistries were normal.  I do not have a chest x-ray for my visualization.  Now she tells me that despite being on inhalers for the last 2 days she is having worsening cough that is dry with white mucus no wheezing no orthopnea no proximal nocturnal dyspnea no fever.  She is worried about the  cough.     Walking desaturation test on 08/30/2017 185 feet x 3 laps on ROOM AIR:  did not desaturate. Waklked at slow moderate pace. Rest pulse ox was 99%, final pulse ox was 94%. HR response 96/min at rest to 101/min at peak exertion. Patient [name] [name]  Did not Desaturate < 88% . [name] [name] yes did  Desaturated </= 3% points. [name] [name] yes did get tachyardic. Got fatigued but only mildly   Results for [name] [name], [name] [name] (MRN [id number]) as of 02/27/2017 15:30  Ref. Range 12/24/2014 16:53 08/30/2017   Nitric Oxide Unknown 27 Could not perform       has a past medical history of Acute sinusitis, unspecified, Anemia, Anxiety state, unspecified, Arachnoiditis (BILATERAL LEGS), Arthritis, Asthma, Blood transfusion, Cancer ([place]), Cardiomyopathy (HX --06/2010), Chronic back pain greater than 3 months duration, CSF leak, Diabetes mellitus without complication ([place]), Dyslipidemia, Dysphagia, Dysrhythmia, Essential hypertension, benign, Family history of adverse reaction to anesthesia, GERD (gastroesophageal reflux disease) (AND HIATIAL HERNIA), Headache(784.0), Heart murmur, History of chronic bronchitis, bladder infections, Hypertension, Murmur, heart, Neuromuscular disorder ([place]), Osteoporosis, Other malaise and fatigue, PONV (postoperative nausea and vomiting), Shortness of breath, Spinal headache, Spinal stenosis, cervical region, Varicosities, and Weakness of both legs (DUE TO ARACHNOIDITIS).   reports that  has never smoked. she has never used smokeless tobacco.  Past Surgical History:  Procedure Laterality Date  . ABDOMINAL HYSTERECTOMY  1987   W/ BSO  . ANTERIOR CERVICAL DECOMP/DISCECTOMY FUSION N/A 08/09/2012   Procedure: ANTERIOR CERVICAL DECOMPRESSION/DISCECTOMY FUSION 1 LEVEL;  Surgeon: Floyce Stakes, MD;  Location: [facility];  Service: Neurosurgery;  Laterality: N/A;  Cervical four-five  Anterior cervical decompression/diskectomy/fusion  . ANTERIOR FUSION CERVICAL SPINE   2007   C5 -6  . APPENDECTOMY    . APPLICATION OF INTRAOPERATIVE CT SCAN N/A 05/17/2017   Procedure: APPLICATION OF INTRAOPERATIVE CT SCAN;  Surgeon: Erline Levine, MD;  Location: [place];  Service: Neurosurgery;  Laterality: N/A;  . BACK SURGERY     x 5  . BREAST SURGERY     x 2 biopsies  . CARPAL TUNNEL RELEASE  RIGHT - 2001  & DEC 2011 W/ BACK SURG.  . CERVICAL DISC SURGERY  2005   C5 - 6  . CHOLECYSTECTOMY  1994  . DORSAL COMPARTMENT RELEASE Right 04/07/2013   Procedure: RIGHT WRIST STS RELEASE;  Surgeon: Schuyler Amor, MD;  Location: [place];  Service: Orthopedics;  Laterality: Right;  . EXPLORATION OF INCISION FOR CSF LEAK  DEC 2011  X2   POST LAMINECOTMY  . FINGER ARTHRODESIS Right 04/07/2013   Procedure: RIGHT INDEX AND RIGHT LONG DISTAL INTERPHALANGEAL JOINT FUSIONS;  Surgeon: Schuyler Amor, MD;  Location: [place];  Service: Orthopedics;  Laterality: Right;  . FINGER ARTHROPLASTY Right 04/07/2013   Procedure: RIGHT THUMB CMC ARTHROPLASTY;  Surgeon: Schuyler Amor, MD;  Location: [place];  Service: Orthopedics;  Laterality: Right;  . GANGLION CYST EXCISION Right 04/07/2013   Procedure: RIGHT WRIST MASS EXCISION;  Surgeon: Schuyler Amor, MD;  Location: [place];  Service: Orthopedics;  Laterality: Right;  . KNEE ARTHROSCOPY  LEFT X3 (LAST ONE 2005)  . KNEE ARTHROSCOPY  05/17/2011   Procedure: ARTHROSCOPY KNEE;  Surgeon: Bradley Ferris III;  Location: [place];  Service: Orthopedics;  Laterality: Right;  WITH MEDIAL MENISECTOMY AND removal of suprapatella fat lump  . LEFT WRIST TENOSYNECTOMY W/ LEFT THUMB JOINT REPAIR  02-24-10  . LUMBAR FUSION  11-30-10   L4 - 5  . LUMBAR FUSION  2000   L2 - 4  . LUMBAR LAMINECTOMY  DEC 2011   L4 - 5  . POSTERIOR CERVICAL FUSION/FORAMINOTOMY N/A 05/17/2017   Procedure: Cervical five  to Thorastic one  Posterior cervical fusion with lateral  mass screws/revision of prior instrumentation;  Surgeon: Erline Levine, MD;  Location: [place];  Service: Neurosurgery;  Laterality: N/A;  C5 to T1 Posterior cervical fusion with lateral mass screws/revision of prior instrumentation  . TENDON REPAIR  JAN 2010   LEFT INDEX AND LONG FINGERS  . TUBAL LIGATION      Allergies  Allergen Reactions  . Latex Itching and Rash  . Morphine And Related Hives and Itching  . Aspirin Nausea And Vomiting    Can take coated asa  . Biaxin [Clarithromycin] Nausea And Vomiting  . Clarithromycin Diarrhea  . Codeine Itching  . Darvocet [Propoxyphene N-Acetaminophen] Itching  . Hydrocodone-Acetaminophen Itching  . Lortab [Hydrocodone-Acetaminophen] Itching  . Percocet [Oxycodone-Acetaminophen] Itching    Immunization History  Administered Date(s) Administered  . Influenza Split 04/19/2013, 03/18/2014  . Influenza,inj,Quad PF,6+ Mos 05/04/2015, 02/27/2017  . Pneumococcal Polysaccharide-23 04/19/2013  . Zoster 05/30/2013    Family History  Problem Relation Age of Onset  . Hypertension Mother   . Heart disease Mother   . Diabetes Mother   . Cancer Father   . Diabetes Brother      Current Outpatient Medications:  .  alendronate (FOSAMAX) 70 MG tablet, Take 70 mg by mouth every Wednesday. Take with a full glass of water on an empty stomach. , Disp: , Rfl:  .  aspirin EC 81 MG tablet, Take 81 mg by mouth at bedtime. , Disp: , Rfl:  .  budesonide-formoterol (SYMBICORT) 80-4.5 MCG/ACT inhaler, Inhale 2 puffs into the lungs 2 (two) times daily., Disp: 1 Inhaler, Rfl: 6 .  Calcium Carbonate-Vit D-Min 1200-1000 MG-UNIT CHEW, Chew 1 tablet by mouth daily. , Disp: , Rfl:  .  cyclobenzaprine (FLEXERIL) 10 MG tablet, Take 10 mg by mouth See admin instructions. Take 10mg  by mouth twice daily every other week alternating with valium, Disp: , Rfl:  .  diazepam (VALIUM) 10 MG tablet, Take 10 mg by mouth See admin instructions. Take 10mg  by mouth once daily as needed  every other week alternating with cyclobenzaprine 10mg , Disp: , Rfl:  .  diltiazem (CARDIZEM CD) 240 MG 24 hr capsule, Take 240 mg by mouth daily., Disp: , Rfl:  .  doxycycline (VIBRA-TABS) 100 MG tablet, Take 1 tablet (100 mg total) by mouth 2 (two) times daily., Disp: 10 tablet, Rfl: 0 .  esomeprazole (NEXIUM) 40 MG capsule, Take 40 mg by mouth every morning. , Disp: , Rfl:  .  estradiol (ESTRACE) 2 MG tablet, Take 2 mg by mouth daily. , Disp: ,  Rfl:  .  gabapentin (NEURONTIN) 600 MG tablet, Take 1 tablet (600 mg total) by mouth 4 (four) times daily. Take 1 tablet twice daily and two tablets at bedtime, Disp: 360 tablet, Rfl: 3 .  JINTELI 1-5 MG-MCG TABS tablet, Take 1 tablet by mouth daily. , Disp: , Rfl:  .  levalbuterol (XOPENEX HFA) 45 MCG/ACT inhaler, Inhale 1-2 puffs into the lungs every 4 (four) hours as needed. For shortness of breath (Patient taking differently: Inhale 2 puffs into the lungs every 4 (four) hours as needed for wheezing or shortness of breath. ), Disp: 1 Inhaler, Rfl: 6 .  losartan-hydrochlorothiazide (HYZAAR) 50-12.5 MG tablet, Take 1 tablet by mouth daily., Disp: , Rfl: 0 .  metFORMIN (GLUCOPHAGE) 500 MG tablet, Take 500 mg by mouth 2 (two) times daily with a meal. , Disp: , Rfl:  .  montelukast (SINGULAIR) 10 MG tablet, Take 10 mg by mouth at bedtime., Disp: , Rfl:  .  naloxone (NARCAN) nasal spray 4 mg/0.1 mL, Place 1 spray into the nose once as needed (Just in case of Opiod Overdose)., Disp: , Rfl:  .  Omega-3 Fatty Acids (FISH OIL) 1000 MG CAPS, Take 1 capsule by mouth 3 (three) times daily., Disp: , Rfl:  .  oxyCODONE (ROXICODONE) 15 MG immediate release tablet, Take 1 tablet (15 mg total) by mouth every 6 (six) hours as needed. For pain, Disp: 120 tablet, Rfl: 0 .  Oxymorphone HCl, Crush Resist, 40 MG PO T12A, Take 40 mg by mouth 2 (two) times daily., Disp: 60 tablet, Rfl: 0 .  simvastatin (ZOCOR) 10 MG tablet, Take 10 mg by mouth daily at 6 PM. , Disp: , Rfl:  .   valACYclovir (VALTREX) 500 MG tablet, Take 500 mg by mouth daily. , Disp: , Rfl:  .  venlafaxine XR (EFFEXOR-XR) 75 MG 24 hr capsule, Take 75 mg by mouth daily. , Disp: , Rfl:  .  vitamin B-12 (CYANOCOBALAMIN) 1000 MCG tablet, Take 1,000 mcg by mouth daily. , Disp: , Rfl:  No current facility-administered medications for this visit.   Facility-Administered Medications Ordered in Other Visits:  .  levalbuterol (XOPENEX) nebulizer solution 0.63 mg, 0.63 mg, Nebulization, Once, Parrett, Tammy S, NP    Review of Systems     Objective:   Physical Exam  Constitutional: She is oriented to person, place, and time. She appears well-developed and well-nourished. No distress.  HENT:  Head: Normocephalic and atraumatic.  Right Ear: External ear normal.  Left Ear: External ear normal.  Mouth/Throat: Oropharynx is clear and moist. No oropharyngeal exudate.  Eyes: Conjunctivae and EOM are normal. Pupils are equal, round, and reactive to light. Right eye exhibits no discharge. Left eye exhibits no discharge. No scleral icterus.  Neck: Normal range of motion. Neck supple. No JVD present. No tracheal deviation present. No thyromegaly present.  Cardiovascular: Normal rate, regular rhythm, normal heart sounds and intact distal pulses. Exam reveals no gallop and no friction rub.  No murmur heard. Pulmonary/Chest: Effort normal. No respiratory distress. She has no wheezes. She has rales. She exhibits no tenderness.  Abdominal: Soft. Bowel sounds are normal. She exhibits no distension and no mass. There is no tenderness. There is no rebound and no guarding.  Musculoskeletal: Normal range of motion. She exhibits no edema or tenderness.  Lymphadenopathy:    She has no cervical adenopathy.  Neurological: She is alert and oriented to person, place, and time. She has normal reflexes. No cranial nerve deficit. She exhibits normal  muscle tone. Coordination normal.  Skin: Skin is warm and dry. No rash noted. She is  not diaphoretic. No erythema. No pallor.  Psychiatric: She has a normal mood and affect. Her behavior is normal. Judgment and thought content normal.  Vitals reviewed.  Vitals:   08/30/17 1205  BP: 126/78  Pulse: 91  SpO2: 94%  Weight: 187 lb 9.6 oz (85.1 kg)  Height: 5\' 5"  (1.651 m)       Assessment:       ICD-10-CM   1. Moderate persistent asthma without complication Y60.63 CBC w/Diff    DG Chest 2 View  2. History of bacterial pneumonia Z87.01   3. History of anemia Z86.2   4. Physical deconditioning R53.81   5. Bibasilar crackles R09.89     ASthma might be flaring up past 2 days but big picture is post pna deconditioning. She could not do FeNO. So before starting prednisone will get CXR and cbc with diff . I also heard crackles for first time (CT neg for ILD in 2017) and likely post hosp atelectasis. IF persists will need HRCT  severa problems above are new     Plan:      Do cxr 2 view Do CBC with diff  Plan  if xray looks good will callin 8d prednisone for cough Continue symbicort scheduled Continue getting stronger  Followup  6 months or sooner if needed   Dr. Brand Males, M.D., [facility].C.P Pulmonary and Critical Care Medicine Staff Physician, [place] Director - Interstitial Lung Disease  Program  P[facility] [place] [facility] [place], [place], [address]  Pager: [phone], If no answer or between  15:00h - 7:00h: call [phone] Telephone: [phone]

## 2017-09-04 ENCOUNTER — Ambulatory Visit: Payer: Medicare Other

## 2017-09-05 DIAGNOSIS — G959 Disease of spinal cord, unspecified: Secondary | ICD-10-CM | POA: Diagnosis not present

## 2017-09-05 DIAGNOSIS — M4802 Spinal stenosis, cervical region: Secondary | ICD-10-CM | POA: Diagnosis not present

## 2017-09-05 DIAGNOSIS — M542 Cervicalgia: Secondary | ICD-10-CM | POA: Diagnosis not present

## 2017-09-05 DIAGNOSIS — M5416 Radiculopathy, lumbar region: Secondary | ICD-10-CM | POA: Diagnosis not present

## 2017-09-05 DIAGNOSIS — M5412 Radiculopathy, cervical region: Secondary | ICD-10-CM | POA: Diagnosis not present

## 2017-09-11 ENCOUNTER — Ambulatory Visit: Payer: Medicare Other

## 2017-09-27 ENCOUNTER — Ambulatory Visit: Payer: Medicare Other

## 2017-09-27 ENCOUNTER — Encounter: Payer: Medicare Other | Attending: Physical Medicine & Rehabilitation | Admitting: Registered Nurse

## 2017-09-27 ENCOUNTER — Other Ambulatory Visit: Payer: Self-pay

## 2017-09-27 ENCOUNTER — Encounter: Payer: Self-pay | Admitting: Registered Nurse

## 2017-09-27 VITALS — BP 97/65 | HR 88 | Ht 65.0 in | Wt 192.0 lb

## 2017-09-27 DIAGNOSIS — Z79891 Long term (current) use of opiate analgesic: Secondary | ICD-10-CM

## 2017-09-27 DIAGNOSIS — G039 Meningitis, unspecified: Secondary | ICD-10-CM | POA: Diagnosis not present

## 2017-09-27 DIAGNOSIS — M961 Postlaminectomy syndrome, not elsewhere classified: Secondary | ICD-10-CM | POA: Diagnosis not present

## 2017-09-27 DIAGNOSIS — R5381 Other malaise: Secondary | ICD-10-CM

## 2017-09-27 DIAGNOSIS — M546 Pain in thoracic spine: Secondary | ICD-10-CM | POA: Diagnosis not present

## 2017-09-27 DIAGNOSIS — M542 Cervicalgia: Secondary | ICD-10-CM

## 2017-09-27 DIAGNOSIS — Z5181 Encounter for therapeutic drug level monitoring: Secondary | ICD-10-CM | POA: Diagnosis not present

## 2017-09-27 DIAGNOSIS — M5412 Radiculopathy, cervical region: Secondary | ICD-10-CM | POA: Diagnosis not present

## 2017-09-27 DIAGNOSIS — R296 Repeated falls: Secondary | ICD-10-CM | POA: Diagnosis not present

## 2017-09-27 DIAGNOSIS — M5416 Radiculopathy, lumbar region: Secondary | ICD-10-CM

## 2017-09-27 DIAGNOSIS — Z79899 Other long term (current) drug therapy: Secondary | ICD-10-CM | POA: Insufficient documentation

## 2017-09-27 DIAGNOSIS — G894 Chronic pain syndrome: Secondary | ICD-10-CM

## 2017-09-27 DIAGNOSIS — M5441 Lumbago with sciatica, right side: Secondary | ICD-10-CM | POA: Insufficient documentation

## 2017-09-27 MED ORDER — OXYCODONE HCL 15 MG PO TABS: 15.0000 mg | ORAL_TABLET | Freq: Four times a day (QID) | ORAL | 0 refills | Status: DC | PRN

## 2017-09-27 MED ORDER — OXYMORPHONE HCL ER 40 MG PO T12A: 40.0000 mg | EXTENDED_RELEASE_TABLET | Freq: Two times a day (BID) | ORAL | 0 refills | Status: DC

## 2017-09-27 NOTE — Progress Notes (Signed)
Subjective:    Patient ID: [name] [name], female    DOB: Oct 25, 1950, 67 y.o.   MRN: [id number]  HPI: [name] is a 67 year old female who returns for follow up appointment for chronic pain and medication refill. She states her pain is located in her neck radiating into her right shoulder, mid-lower back pain radiating into her bilateral lower extremities. She rates her pain 7. Her current exercise regime is walking.  [name] reports on 09/05/2017 she was walking in her kitchen to her dinning room and slipped on some water she states, and landed on her buttocks. She didn't seek medical attention on 09/06/18. Stets she seen Dr. Vertell Limber on 09/06/2017 and X-rays were obtained. Educated on Falls Prevention instructed to use walker at all times, referral to neurology and physical therapy she verbalizes understanding.    [name] Morphine Equivalent is 341.00 MME. She is also prescribed Diazepam by Dr. Amanda Pea. We have reviewed the black box warning regarding using opioids and benzodiazepines. I highlighted the dangers of using these drugs together and discussed the adverse events including respiratory suppression, overdose, cognitive impairment and importance of compliance with current regimen. She verbalizes understanding, we will continue to monitor and adjust as indicated.   [name] surgery was performed on 05/17/2017 by Dr. Vertell Limber . He performed C5C6C7 T1 Posterior Cervical Fusion with lateral mass fixation with AIRO Imaging, revision of prior instrumentation.   Last UDS was Performed on 12/05/2016, it was consistent.   Pain Inventory Average Pain 7 Pain Right Now 7 My pain is sharp, burning and tingling  In the last 24 hours, has pain interfered with the following? General activity 7 Relation with others 6 Enjoyment of life 7 What TIME of day is your pain at its worst? morning daytime evening night Sleep (in general) Poor  Pain is worse with: walking, bending, standing and some  activites Pain improves with: rest, heat/ice and medication Relief from Meds: 7  Mobility Do you have any goals in this area?  no  Function Do you have any goals in this area?  no  Neuro/Psych No problems in this area  Prior Studies Any changes since last visit?  no  Physicians involved in your care Any changes since last visit?  no   Family History  Problem Relation Age of Onset  . Hypertension Mother   . Heart disease Mother   . Diabetes Mother   . Cancer Father   . Diabetes Brother    Social History   Socioeconomic History  . Marital status: Divorced    Spouse name: Not on file  . Number of children: Not on file  . Years of education: Not on file  . Highest education level: Not on file  Occupational History  . Occupation: disable    Employer: DISABLED  Social Needs  . Financial resource strain: Not on file  . Food insecurity:    Worry: Not on file    Inability: Not on file  . Transportation needs:    Medical: Not on file    Non-medical: Not on file  Tobacco Use  . Smoking status: Never Smoker  . Smokeless tobacco: Never Used  Substance and Sexual Activity  . Alcohol use: No  . Drug use: No  . Sexual activity: Not on file    Comment: second hand smoke  Lifestyle  . Physical activity:    Days per week: Not on file    Minutes per session: Not on file  .  Stress: Not on file  Relationships  . Social connections:    Talks on phone: Not on file    Gets together: Not on file    Attends religious service: Not on file    Active member of club or organization: Not on file    Attends meetings of clubs or organizations: Not on file    Relationship status: Not on file  Other Topics Concern  . Not on file  Social History Narrative   Has one child   Disabled   Past Surgical History:  Procedure Laterality Date  . ABDOMINAL HYSTERECTOMY  1987   W/ BSO  . ANTERIOR CERVICAL DECOMP/DISCECTOMY FUSION N/A 08/09/2012   Procedure: ANTERIOR CERVICAL  DECOMPRESSION/DISCECTOMY FUSION 1 LEVEL;  Surgeon: Floyce Stakes, MD;  Location: [facility];  Service: Neurosurgery;  Laterality: N/A;  Cervical four-five  Anterior cervical decompression/diskectomy/fusion  . ANTERIOR FUSION CERVICAL SPINE  2007   C5 -6  . APPENDECTOMY    . APPLICATION OF INTRAOPERATIVE CT SCAN N/A 05/17/2017   Procedure: APPLICATION OF INTRAOPERATIVE CT SCAN;  Surgeon: Erline Levine, MD;  Location: [place];  Service: Neurosurgery;  Laterality: N/A;  . BACK SURGERY     x 5  . BREAST SURGERY     x 2 biopsies  . CARPAL TUNNEL RELEASE  RIGHT - 2001  & DEC 2011 W/ BACK SURG.  . CERVICAL DISC SURGERY  2005   C5 - 6  . CHOLECYSTECTOMY  1994  . DORSAL COMPARTMENT RELEASE Right 04/07/2013   Procedure: RIGHT WRIST STS RELEASE;  Surgeon: Schuyler Amor, MD;  Location: [place];  Service: Orthopedics;  Laterality: Right;  . EXPLORATION OF INCISION FOR CSF LEAK  DEC 2011  X2   POST LAMINECOTMY  . FINGER ARTHRODESIS Right 04/07/2013   Procedure: RIGHT INDEX AND RIGHT LONG DISTAL INTERPHALANGEAL JOINT FUSIONS;  Surgeon: Schuyler Amor, MD;  Location: [place];  Service: Orthopedics;  Laterality: Right;  . FINGER ARTHROPLASTY Right 04/07/2013   Procedure: RIGHT THUMB [place] ARTHROPLASTY;  Surgeon: Schuyler Amor, MD;  Location: [place];  Service: Orthopedics;  Laterality: Right;  . GANGLION CYST EXCISION Right 04/07/2013   Procedure: RIGHT WRIST MASS EXCISION;  Surgeon: Schuyler Amor, MD;  Location: [place];  Service: Orthopedics;  Laterality: Right;  . KNEE ARTHROSCOPY  LEFT X3 (LAST ONE 2005)  . KNEE ARTHROSCOPY  05/17/2011   Procedure: ARTHROSCOPY KNEE;  Surgeon: Bradley Ferris III;  Location: [place];  Service: Orthopedics;  Laterality: Right;  WITH MEDIAL MENISECTOMY AND removal of suprapatella fat lump  . LEFT WRIST TENOSYNECTOMY W/ LEFT THUMB JOINT REPAIR  02-24-10  . LUMBAR  FUSION  11-30-10   L4 - 5  . LUMBAR FUSION  2000   L2 - 4  . LUMBAR LAMINECTOMY  DEC 2011   L4 - 5  . POSTERIOR CERVICAL FUSION/FORAMINOTOMY N/A 05/17/2017   Procedure: Cervical five  to Thorastic one  Posterior cervical fusion with lateral mass screws/revision of prior instrumentation;  Surgeon: Erline Levine, MD;  Location: [place];  Service: Neurosurgery;  Laterality: N/A;  C5 to T1 Posterior cervical fusion with lateral mass screws/revision of prior instrumentation  . TENDON REPAIR  JAN 2010   LEFT INDEX AND LONG FINGERS  . TUBAL LIGATION     Past Medical History:  Diagnosis Date  . Acute sinusitis, unspecified   . Anemia    "quite a few times"  . Anxiety  state, unspecified   . Arachnoiditis BILATERAL LEGS   DUE TO MULTIPLE BACK SURG.'S  . Arthritis   . Asthma    last flare up was 03/2017 lasted over a month  . Blood transfusion   . Cancer ([place])    skin cancers (in scalp)  . Cardiomyopathy HX --06/2010   EF was 25% during acute illness (PHELONEPHRITIS) Repeat echo 12-06-10 60% showed normal EF.   Marland Kitchen Chronic back pain greater than 3 months duration    S/P BACK SURG'S  . CSF leak   . Diabetes mellitus without complication ([place])    dx 2013 type 2  . Dyslipidemia   . Dysphagia    some post op cerv fusion 2/14  . Dysrhythmia   . Essential hypertension, benign   . Family history of adverse reaction to anesthesia    mother gets n/v  . GERD (gastroesophageal reflux disease) AND HIATIAL HERNIA   CONTROLLED W/ NEXIUM  . Headache(784.0)   . Heart murmur    DENIES S & S   (ECHO JUN'12 W/ CHART)  . History of chronic bronchitis   . Hx of bladder infections   . Hypertension   . Murmur, heart   . Neuromuscular disorder (HCC)    numbness and tingling  . Osteoporosis   . Other malaise and fatigue   . PONV (postoperative nausea and vomiting)   . Shortness of breath   . Spinal headache   . Spinal stenosis, cervical region   . Varicosities    venous  . Weakness of both legs  DUE TO ARACHNOIDITIS   OCCASIONAL USES CANE   There were no vitals taken for this visit.  Opioid Risk Score:   Fall Risk Score:  `1  Depression screen PHQ 2/9  Depression screen [facility] 2/9 09/27/2017 07/02/2017 06/04/2017 05/03/2017 03/05/2017 01/05/2017 01/20/2016  Decreased Interest 0 1 1 0 0 0 0  Down, Depressed, Hopeless 0 1 1 0 0 0 0  PHQ - 2 Score 0 2 2 0 0 0 0  Altered sleeping 0 - - - - - -  Tired, decreased energy 0 - - - - - -  Change in appetite 0 - - - - - -  Feeling bad or failure about yourself  0 - - - - - -  Trouble concentrating 0 - - - - - -  Moving slowly or fidgety/restless 0 - - - - - -  Suicidal thoughts 0 - - - - - -  PHQ-9 Score 0 - - - - - -  Some recent data might be hidden    Review of Systems  Constitutional: Negative.   HENT: Negative.   Eyes: Negative.   Respiratory: Negative.   Cardiovascular: Negative.   Gastrointestinal: Negative.   Endocrine: Negative.   Genitourinary: Negative.   Musculoskeletal: Negative.   Skin: Negative.   Allergic/Immunologic: Negative.   Neurological: Negative.   Hematological: Negative.   Psychiatric/Behavioral: Negative.   All other systems reviewed and are negative.      Objective:   Physical Exam  Constitutional: She is oriented to person, place, and time. She appears well-developed and well-nourished.  HENT:  Head: Normocephalic and atraumatic.  Neck: Normal range of motion. Neck supple.  Cervical Paraspinal Tenderness: C-5-C-6  Cardiovascular: Normal rate and regular rhythm.  Pulmonary/Chest: Effort normal and breath sounds normal.  Musculoskeletal:  Normal Muscle Bulk and Muscle Testing Reveals: Upper Extremities: Full ROM and Muscle Strength 5/5 Bilateral AC Joint Tenderness Thoracic and Lumbar Hypersensitivity Lower  Extremities: Full ROM and Muscle Strength 5/5 Arises from Table slowly Antalgic gait  Neurological: She is alert and oriented to person, place, and time.  Skin: Skin is warm and dry.    Psychiatric: She has a normal mood and affect.  Nursing note and vitals reviewed.         Assessment & Plan:  1. On 05/17/2017 :C5C6C7 T1 Posterior Cervical Fusion with lateral mass fixation with AIRO Imaging, revision of prior instrumentation. By Dr. Vertell Limber.   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 Continue current medication regimen with Gabapentin. 09/27/2017 Continue current medication medication regime:  Refilled:Oxycodone 15mg  one tablet every 6 hours as needed # 120 and  Oxymorphone HCL 40 mg every 12 hours #60. We will continue the opioid monitoring program, this consists of regular clinic visits, examinations, urine drug screen, pill counts as well as use of [place] Controlled Substance Reporting System. 2. 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 09/27/2017 3. Anxiety/depression: Continue Valium, PCP Following. Continue Effexor. Continue  To monitor. 09/27/2017 4. Muscle Spasms: Continue current medication regime with Flexeril. Continue to Monitor. 09/27/2017 5. Cervicalgia/ Cervical Radiculitis: Continue current medication regime with Gabapentin: Dr. Vertell Limber Following: S/P  C5C6C7 T1 Posterior Cervical Fusion with lateral mass fixation with AIRO Imaging, revision of prior instrumentation by Dr. Vertell Limber on 05/17/2017. 08/30/2017 6. Frequent Falls: RX: Neurology referral. Referral: Physical Therapy: Educated on Falls instructed to use her walker at all times, she verbalizes understanding. 09/27/2017.  30 minutes of face to face patient care time was spent during this visit. All questions were encouraged and answered.  F/U in 1 month

## 2017-10-03 LAB — TOXASSURE SELECT,+ANTIDEPR,UR

## 2017-10-04 ENCOUNTER — Telehealth: Payer: Self-pay | Admitting: *Deleted

## 2017-10-04 NOTE — Telephone Encounter (Signed)
Urine drug screen for this encounter is consistent for prescribed medication 

## 2017-10-06 ENCOUNTER — Encounter: Payer: Self-pay | Admitting: Registered Nurse

## 2017-10-18 DIAGNOSIS — I872 Venous insufficiency (chronic) (peripheral): Secondary | ICD-10-CM | POA: Diagnosis not present

## 2017-10-22 ENCOUNTER — Ambulatory Visit
Admission: RE | Admit: 2017-10-22 | Discharge: 2017-10-22 | Disposition: A | Discharge status: Home / Self Care | Payer: Medicare Other | Source: Ambulatory Visit | Attending: Internal Medicine | Admitting: Internal Medicine

## 2017-10-22 DIAGNOSIS — Z1231 Encounter for screening mammogram for malignant neoplasm of breast: Secondary | ICD-10-CM | POA: Diagnosis not present

## 2017-10-25 ENCOUNTER — Other Ambulatory Visit: Payer: Self-pay

## 2017-10-25 ENCOUNTER — Encounter: Payer: Self-pay | Admitting: Registered Nurse

## 2017-10-25 ENCOUNTER — Encounter: Payer: Medicare Other | Attending: Physical Medicine & Rehabilitation | Admitting: Registered Nurse

## 2017-10-25 VITALS — BP 115/75 | HR 92

## 2017-10-25 DIAGNOSIS — M5416 Radiculopathy, lumbar region: Secondary | ICD-10-CM

## 2017-10-25 DIAGNOSIS — G039 Meningitis, unspecified: Secondary | ICD-10-CM | POA: Diagnosis not present

## 2017-10-25 DIAGNOSIS — G894 Chronic pain syndrome: Secondary | ICD-10-CM

## 2017-10-25 DIAGNOSIS — M5441 Lumbago with sciatica, right side: Secondary | ICD-10-CM | POA: Insufficient documentation

## 2017-10-25 DIAGNOSIS — Z79899 Other long term (current) drug therapy: Secondary | ICD-10-CM | POA: Insufficient documentation

## 2017-10-25 DIAGNOSIS — Z79891 Long term (current) use of opiate analgesic: Secondary | ICD-10-CM

## 2017-10-25 DIAGNOSIS — M546 Pain in thoracic spine: Secondary | ICD-10-CM

## 2017-10-25 DIAGNOSIS — M542 Cervicalgia: Secondary | ICD-10-CM | POA: Diagnosis not present

## 2017-10-25 DIAGNOSIS — M5412 Radiculopathy, cervical region: Secondary | ICD-10-CM | POA: Diagnosis not present

## 2017-10-25 DIAGNOSIS — Z5181 Encounter for therapeutic drug level monitoring: Secondary | ICD-10-CM | POA: Diagnosis not present

## 2017-10-25 DIAGNOSIS — M961 Postlaminectomy syndrome, not elsewhere classified: Secondary | ICD-10-CM | POA: Insufficient documentation

## 2017-10-25 MED ORDER — GABAPENTIN 800 MG PO TABS: 800.0000 mg | ORAL_TABLET | Freq: Three times a day (TID) | ORAL | 0 refills | Status: DC

## 2017-10-25 MED ORDER — OXYCODONE HCL 15 MG PO TABS: 15.0000 mg | ORAL_TABLET | Freq: Four times a day (QID) | ORAL | 0 refills | Status: DC | PRN

## 2017-10-25 MED ORDER — GABAPENTIN 800 MG PO TABS: 800.0000 mg | ORAL_TABLET | Freq: Four times a day (QID) | ORAL | 0 refills | Status: DC

## 2017-10-25 MED ORDER — OXYMORPHONE HCL ER 40 MG PO T12A
40.0000 mg | EXTENDED_RELEASE_TABLET | Freq: Two times a day (BID) | ORAL | 0 refills | Status: DC
Start: 2017-10-25 — End: 2017-11-19

## 2017-10-25 NOTE — Progress Notes (Signed)
Subjective:    Patient ID: [name] [name], female    DOB: [birth year], 67 y.o.   MRN: [id number]  HPI: [name] is a 67 year old female who returns for follow up appointment for chronic pain and medication refill. She states her pain is located in her neck radiating into her bilateral shoulders and lower back pain radiating into her right lower extremity. Also reports increase intensity of neuropathic pain, we will increase gabapentin, instructed to call office in two weeks to evaluate medication change. [name] [name] states she would like to obtain a Myleogram, we discuss other treatment modalities as it relates to her neuropathic pain. She verbalizes understanding. The final decision will be up to Dr. Naaman [name], she verbalizes understanding.  Her next appointment will be with Dr. Naaman [name], she would like to discuss the above with him.  She rates her pain 8. Her current exercise regime is walking with her cane.  [name] [name] Morphine Equivalent is 352.00 MME. She is also prescribed Diazepam by Dr. Sherrie Sport .We have discussed the black box warning of using opioids and benzodiazepines. I highlighted the dangers of using these drugs together and discussed the adverse events including respiratory suppression, overdose, cognitive impairment and importance of compliance with current regimen. We will continue to monitor and adjust as indicated.   Last UDS was Performed on 09/27/2017, it was consistent.   Pain Inventory Average Pain 8 Pain Right Now 8 My pain is sharp, burning, stabbing, tingling and aching  In the last 24 hours, has pain interfered with the following? General activity 7 Relation with others 7 Enjoyment of life 7 What TIME of day is your pain at its worst? all Sleep (in general) Fair  Pain is worse with: walking, sitting, inactivity, standing and some activites Pain improves with: heat/ice and medication Relief from Meds: 7  Mobility use a cane use a walker ability to climb steps?   yes do you drive?  yes  Function Do you have any goals in this area?  no  Neuro/Psych No problems in this area  Prior Studies Any changes since last visit?  no  Physicians involved in your care Primary care Dr. Ronnell [place] Neurosurgeon Dr. Vertell Limber   Family History  Problem Relation Age of Onset  . Hypertension Mother   . Heart disease Mother   . Diabetes Mother   . Cancer Father   . Diabetes Brother    Social History   Socioeconomic History  . Marital status: Divorced    Spouse name: Not on file  . Number of children: Not on file  . Years of education: Not on file  . Highest education level: Not on file  Occupational History  . Occupation: disable    Employer: DISABLED  Social Needs  . Financial resource strain: Not on file  . Food insecurity:    Worry: Not on file    Inability: Not on file  . Transportation needs:    Medical: Not on file    Non-medical: Not on file  Tobacco Use  . Smoking status: Never Smoker  . Smokeless tobacco: Never Used  Substance and Sexual Activity  . Alcohol use: No  . Drug use: No  . Sexual activity: Not on file    Comment: second hand smoke  Lifestyle  . Physical activity:    Days per week: Not on file    Minutes per session: Not on file  . Stress: Not on file  Relationships  . Social connections:  Talks on phone: Not on file    Gets together: Not on file    Attends religious service: Not on file    Active member of club or organization: Not on file    Attends meetings of clubs or organizations: Not on file    Relationship status: Not on file  Other Topics Concern  . Not on file  Social History Narrative   Has one child   Disabled   Past Surgical History:  Procedure Laterality Date  . ABDOMINAL HYSTERECTOMY  1987   W/ BSO  . ANTERIOR CERVICAL DECOMP/DISCECTOMY FUSION N/A 08/09/2012   Procedure: ANTERIOR CERVICAL DECOMPRESSION/DISCECTOMY FUSION 1 LEVEL;  Surgeon: Floyce Stakes, MD;  Location: [facility];  Service:  Neurosurgery;  Laterality: N/A;  Cervical four-five  Anterior cervical decompression/diskectomy/fusion  . ANTERIOR FUSION CERVICAL SPINE  2007   C5 -6  . APPENDECTOMY    . APPLICATION OF INTRAOPERATIVE CT SCAN N/A 05/17/2017   Procedure: APPLICATION OF INTRAOPERATIVE CT SCAN;  Surgeon: Erline Levine, MD;  Location: [place];  Service: Neurosurgery;  Laterality: N/A;  . BACK SURGERY     x 5  . BREAST EXCISIONAL BIOPSY Bilateral    No scar seen   . BREAST SURGERY     x 2 biopsies  . CARPAL TUNNEL RELEASE  RIGHT - 2001  & DEC 2011 W/ BACK SURG.  . CERVICAL DISC SURGERY  2005   C5 - 6  . CHOLECYSTECTOMY  1994  . DORSAL COMPARTMENT RELEASE Right 04/07/2013   Procedure: RIGHT WRIST STS RELEASE;  Surgeon: Schuyler Amor, MD;  Location: [place];  Service: Orthopedics;  Laterality: Right;  . EXPLORATION OF INCISION FOR CSF LEAK  DEC 2011  X2   POST LAMINECOTMY  . FINGER ARTHRODESIS Right 04/07/2013   Procedure: RIGHT INDEX AND RIGHT LONG DISTAL INTERPHALANGEAL JOINT FUSIONS;  Surgeon: Schuyler Amor, MD;  Location: [place];  Service: Orthopedics;  Laterality: Right;  . FINGER ARTHROPLASTY Right 04/07/2013   Procedure: RIGHT THUMB [place] ARTHROPLASTY;  Surgeon: Schuyler Amor, MD;  Location: [place];  Service: Orthopedics;  Laterality: Right;  . GANGLION CYST EXCISION Right 04/07/2013   Procedure: RIGHT WRIST MASS EXCISION;  Surgeon: Schuyler Amor, MD;  Location: [place];  Service: Orthopedics;  Laterality: Right;  . KNEE ARTHROSCOPY  LEFT X3 (LAST ONE 2005)  . KNEE ARTHROSCOPY  05/17/2011   Procedure: ARTHROSCOPY KNEE;  Surgeon: Bradley Ferris III;  Location: [place];  Service: Orthopedics;  Laterality: Right;  WITH MEDIAL MENISECTOMY AND removal of suprapatella fat lump  . LEFT WRIST TENOSYNECTOMY W/ LEFT THUMB JOINT REPAIR  02-24-10  . LUMBAR FUSION  11-30-10   L4 - 5  . LUMBAR FUSION  2000     L2 - 4  . LUMBAR LAMINECTOMY  DEC 2011   L4 - 5  . POSTERIOR CERVICAL FUSION/FORAMINOTOMY N/A 05/17/2017   Procedure: Cervical five  to Thorastic one  Posterior cervical fusion with lateral mass screws/revision of prior instrumentation;  Surgeon: Erline Levine, MD;  Location: [place];  Service: Neurosurgery;  Laterality: N/A;  C5 to T1 Posterior cervical fusion with lateral mass screws/revision of prior instrumentation  . TENDON REPAIR  JAN 2010   LEFT INDEX AND LONG FINGERS  . TUBAL LIGATION     Past Medical History:  Diagnosis Date  . Acute sinusitis, unspecified   . Anemia    "quite a few times"  .  Anxiety state, unspecified   . Arachnoiditis BILATERAL LEGS   DUE TO MULTIPLE BACK SURG.'S  . Arthritis   . Asthma    last flare up was 03/2017 lasted over a month  . Blood transfusion   . Cancer ([place])    skin cancers (in scalp)  . Cardiomyopathy HX --06/2010   EF was 25% during acute illness (PHELONEPHRITIS) Repeat echo 12-06-10 60% showed normal EF.   Marland Kitchen Chronic back pain greater than 3 months duration    S/P BACK SURG'S  . CSF leak   . Diabetes mellitus without complication ([place])    dx 2013 type 2  . Dyslipidemia   . Dysphagia    some post op cerv fusion 2/14  . Dysrhythmia   . Essential hypertension, benign   . Family history of adverse reaction to anesthesia    mother gets n/v  . GERD (gastroesophageal reflux disease) AND HIATIAL HERNIA   CONTROLLED W/ NEXIUM  . Headache(784.0)   . Heart murmur    DENIES S & S   (ECHO JUN'12 W/ CHART)  . History of chronic bronchitis   . Hx of bladder infections   . Hypertension   . Murmur, heart   . Neuromuscular disorder (HCC)    numbness and tingling  . Osteoporosis   . Other malaise and fatigue   . PONV (postoperative nausea and vomiting)   . Shortness of breath   . Spinal headache   . Spinal stenosis, cervical region   . Varicosities    venous  . Weakness of both legs DUE TO ARACHNOIDITIS   OCCASIONAL USES CANE   BP  115/75   Pulse 92   SpO2 93%   Opioid Risk Score:   Fall Risk Score:  `1  Depression screen PHQ 2/9  Depression screen [facility] 2/9 10/25/2017 09/27/2017 07/02/2017 06/04/2017 05/03/2017 03/05/2017 01/05/2017  Decreased Interest 0 0 1 1 0 0 0  Down, Depressed, Hopeless 0 0 1 1 0 0 0  PHQ - 2 Score 0 0 2 2 0 0 0  Altered sleeping - 0 - - - - -  Tired, decreased energy - 0 - - - - -  Change in appetite - 0 - - - - -  Feeling bad or failure about yourself  - 0 - - - - -  Trouble concentrating - 0 - - - - -  Moving slowly or fidgety/restless - 0 - - - - -  Suicidal thoughts - 0 - - - - -  PHQ-9 Score - 0 - - - - -  Some recent data might be hidden    Review of Systems  Constitutional: Negative.   HENT: Negative.   Eyes: Negative.   Respiratory: Negative.   Cardiovascular: Negative.   Gastrointestinal: Negative.   Endocrine: Negative.   Genitourinary: Negative.   Musculoskeletal: Negative.   Skin: Negative.   Allergic/Immunologic: Negative.   Hematological: Negative.   Psychiatric/Behavioral: Negative.   All other systems reviewed and are negative.      Objective:   Physical Exam  Constitutional: She is oriented to person, place, and time. She appears well-developed and well-nourished.  HENT:  Head: Normocephalic and atraumatic.  Neck: Normal range of motion. Neck supple.  Cervical Paraspinal Tenderness: C-5-C-6  Cardiovascular: Normal rate and regular rhythm.  Pulmonary/Chest: Effort normal and breath sounds normal.  Musculoskeletal:  Normal Muscle Bulk and Muscle Testing Reveals: Upper Extremities: Decreased ROM 90 Degrees and Muscle Strength 4/5 Bilateral AC Joint Tenderness Thoracic and Lumbar  Hypersensitivity Lower Extremities: Full ROM and Muscle Strength 5/5 Arises from Table slowly using cane for support Antalgic gait   Neurological: She is alert and oriented to person, place, and time.  Skin: Skin is warm and dry.  Psychiatric: She has a normal mood and affect.    Nursing note and vitals reviewed.         Assessment & Plan:  1. On 05/17/2017 :C5C6C7 T1 Posterior Cervical Fusion with lateral mass fixation with AIRO Imaging, revision of prior instrumentation. By Dr. Vertell Limber.  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 Continue current medication regimen with Increased Gabapentin 800 mg QID. 10/25/2017. Refilled:Oxycodone 15mg  one tablet every 6 hours as needed # 120 and  Oxymorphone HCL 40 mg every 12 hours #60. We will continue the opioid monitoring program, this consists of regular clinic visits, examinations, urine drug screen, pill counts as well as use of [place] Controlled Substance Reporting System. 2. 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 10/25/2017 3. Anxiety/depression: Continue Valium, PCP Following. Continue Effexor. Continue To monitor. 10/25/2017 4. Muscle Spasms: Continue current medication regime with Flexeril. Continue to Monitor. 10/25/2017 5. Cervicalgia/ Cervical Radiculitis: Continue current medication regime with Gabapentin: Dr. Vertell Limber Following: S/P C5C6C7 T1 Posterior Cervical Fusion with lateral mass fixation with AIRO Imaging, revision of prior instrumentation by Dr. Vertell Limber on 05/17/2017. 10/25/2017 6. Frequent Falls: Awaiting  Neurology appointment.10/25/2017  30 minutes of face to face patient care time was spent during this visit. All questions were encouraged and answered.  F/U in 1 month

## 2017-11-12 ENCOUNTER — Encounter: Payer: Self-pay | Admitting: Physical Medicine & Rehabilitation

## 2017-11-13 ENCOUNTER — Telehealth: Payer: Self-pay

## 2017-11-13 NOTE — Telephone Encounter (Signed)
Patient sent an e-mail regarding an upcoming appointment:  "Is it anyway possible I can change my appointment from Monday June 3rd to Thursday June 6th? My mom is 22 and she has contact dermatitis on both of her legs left worse than right. I have been going over there the past 2 weeks washing them, putting a specific type of powder and then rewrapping them to keep them from wheaping. There is no one but my brother and myself and he works Monday through Wednesday but he takes over after that to give me a break. There is never a day I would go there it's at least 5 before I get to leave. She has a CT on her abdomen Thursday and I take her back on Friday for the results. She is going to have to have surgery but Dr. Lelon Huh isn't sure what he will need to do until he gets the CT. If I can't see you is it possible I could see Zella Ball and then see you in July? I hate to ask this but if it's not possible I will try to figure something out. I just will run out of my meds on Friday. I have pictures to show you of her legs. Just let me know.

## 2017-11-19 ENCOUNTER — Encounter: Payer: Medicare Other | Attending: Physical Medicine & Rehabilitation | Admitting: Physical Medicine & Rehabilitation

## 2017-11-19 ENCOUNTER — Encounter: Payer: Self-pay | Admitting: Physical Medicine & Rehabilitation

## 2017-11-19 ENCOUNTER — Encounter

## 2017-11-19 VITALS — BP 123/66 | HR 89 | Ht 65.0 in | Wt 195.0 lb

## 2017-11-19 DIAGNOSIS — M5441 Lumbago with sciatica, right side: Secondary | ICD-10-CM | POA: Diagnosis not present

## 2017-11-19 DIAGNOSIS — Z5181 Encounter for therapeutic drug level monitoring: Secondary | ICD-10-CM | POA: Diagnosis not present

## 2017-11-19 DIAGNOSIS — G039 Meningitis, unspecified: Secondary | ICD-10-CM

## 2017-11-19 DIAGNOSIS — M5416 Radiculopathy, lumbar region: Secondary | ICD-10-CM | POA: Diagnosis not present

## 2017-11-19 DIAGNOSIS — M961 Postlaminectomy syndrome, not elsewhere classified: Secondary | ICD-10-CM | POA: Diagnosis not present

## 2017-11-19 DIAGNOSIS — Z79899 Other long term (current) drug therapy: Secondary | ICD-10-CM | POA: Insufficient documentation

## 2017-11-19 MED ORDER — OXYCODONE HCL 15 MG PO TABS
15.0000 mg | ORAL_TABLET | Freq: Four times a day (QID) | ORAL | 0 refills | Status: DC | PRN
Start: 2017-11-19 — End: 2017-12-10

## 2017-11-19 MED ORDER — OXYMORPHONE HCL ER 40 MG PO T12A: 40.0000 mg | EXTENDED_RELEASE_TABLET | Freq: Two times a day (BID) | ORAL | 0 refills | Status: DC

## 2017-11-19 MED ORDER — GABAPENTIN 800 MG PO TABS: 800.0000 mg | ORAL_TABLET | Freq: Four times a day (QID) | ORAL | 5 refills | Status: DC

## 2017-11-19 MED ORDER — CITALOPRAM HYDROBROMIDE 20 MG PO TABS
20.0000 mg | ORAL_TABLET | Freq: Every day | ORAL | 2 refills | Status: DC
Start: 2017-11-19 — End: 2018-01-21

## 2017-11-19 NOTE — Patient Instructions (Signed)
PLEASE FEEL FREE TO CALL OUR OFFICE WITH ANY PROBLEMS OR QUESTIONS ([phone])      

## 2017-11-19 NOTE — Progress Notes (Signed)
Subjective:    Patient ID: [name] [name], female    DOB: 04-Mar-1951, 67 y.o.   MRN: [id number]  HPI   [name] [name] is here for follow-up of her chronic pain.  [name] [name] had generally good results with her cervical surgery.  [name] [name] has ongoing problems with her back and legs however.  [name] [name] has had falls at home.  There has been a lot of stress on her as [name] [name] has been responsible for the care of her mother whose had her own health problems.  [name] [name] is using a cane for balance at the insistence of our nurse practitioner fortunately which has helped.  For pain [name] [name] remains on oxycodone 15 mg every 6 hours as needed and Opana ER 40 mg every 12 hours.  [name] [name] does feel more anxious in general largely related to her situation with mother.  [name] [name] asked if there is anything we could change her add to help her with this.  [name] [name] is on Effexor 75 mg XR daily.  Pain Inventory Average Pain 8 Pain Right Now 8 My pain is sharp, burning, stabbing, tingling and aching  In the last 24 hours, has pain interfered with the following? General activity 7 Relation with others 7 Enjoyment of life 7 What TIME of day is your pain at its worst? all Sleep (in general) Fair  Pain is worse with: walking, sitting, inactivity, standing and some activites Pain improves with: rest and medication Relief from Meds: .  Mobility use a cane use a walker  Function Do you have any goals in this area?  no  Neuro/Psych trouble walking  Prior Studies Any changes since last visit?  no  Physicians involved in your care Any changes since last visit?  no   Family History  Problem Relation Age of Onset  . Hypertension Mother   . Heart disease Mother   . Diabetes Mother   . Cancer Father   . Diabetes Brother    Social History   Socioeconomic History  . Marital status: Divorced    Spouse name: Not on file  . Number of children: Not on file  . Years of education: Not on file  . Highest education level: Not on file  Occupational  History  . Occupation: disable    Employer: DISABLED  Social Needs  . Financial resource strain: Not on file  . Food insecurity:    Worry: Not on file    Inability: Not on file  . Transportation needs:    Medical: Not on file    Non-medical: Not on file  Tobacco Use  . Smoking status: Never [name]  . Smokeless tobacco: Never Used  Substance and Sexual Activity  . Alcohol use: No  . Drug use: No  . Sexual activity: Not on file    Comment: second hand smoke  Lifestyle  . Physical activity:    Days per week: Not on file    Minutes per session: Not on file  . Stress: Not on file  Relationships  . Social connections:    Talks on phone: Not on file    Gets together: Not on file    Attends religious service: Not on file    Active member of club or organization: Not on file    Attends meetings of clubs or organizations: Not on file    Relationship status: Not on file  Other Topics Concern  . Not on file  Social History Narrative   Has one child   Disabled  Past Surgical History:  Procedure Laterality Date  . ABDOMINAL HYSTERECTOMY  1987   W/ BSO  . ANTERIOR CERVICAL DECOMP/DISCECTOMY FUSION N/A 08/09/2012   Procedure: ANTERIOR CERVICAL DECOMPRESSION/DISCECTOMY FUSION 1 LEVEL;  Surgeon: Floyce Stakes, MD;  Location: [facility];  Service: Neurosurgery;  Laterality: N/A;  Cervical four-five  Anterior cervical decompression/diskectomy/fusion  . ANTERIOR FUSION CERVICAL SPINE  2007   C5 -6  . APPENDECTOMY    . APPLICATION OF INTRAOPERATIVE CT SCAN N/A 05/17/2017   Procedure: APPLICATION OF INTRAOPERATIVE CT SCAN;  Surgeon: Erline Levine, MD;  Location: [place];  Service: Neurosurgery;  Laterality: N/A;  . BACK SURGERY     x 5  . BREAST EXCISIONAL BIOPSY Bilateral    No scar seen   . BREAST SURGERY     x 2 biopsies  . CARPAL TUNNEL RELEASE  RIGHT - 2001  & DEC 2011 W/ BACK SURG.  . CERVICAL DISC SURGERY  2005   C5 - 6  . CHOLECYSTECTOMY  1994  . DORSAL COMPARTMENT  RELEASE Right 04/07/2013   Procedure: RIGHT WRIST STS RELEASE;  Surgeon: Schuyler Amor, MD;  Location: [place];  Service: Orthopedics;  Laterality: Right;  . EXPLORATION OF INCISION FOR CSF LEAK  DEC 2011  X2   POST LAMINECOTMY  . FINGER ARTHRODESIS Right 04/07/2013   Procedure: RIGHT INDEX AND RIGHT LONG DISTAL INTERPHALANGEAL JOINT FUSIONS;  Surgeon: Schuyler Amor, MD;  Location: [place];  Service: Orthopedics;  Laterality: Right;  . FINGER ARTHROPLASTY Right 04/07/2013   Procedure: RIGHT THUMB [place] ARTHROPLASTY;  Surgeon: Schuyler Amor, MD;  Location: [place];  Service: Orthopedics;  Laterality: Right;  . GANGLION CYST EXCISION Right 04/07/2013   Procedure: RIGHT WRIST MASS EXCISION;  Surgeon: Schuyler Amor, MD;  Location: [place];  Service: Orthopedics;  Laterality: Right;  . KNEE ARTHROSCOPY  LEFT X3 (LAST ONE 2005)  . KNEE ARTHROSCOPY  05/17/2011   Procedure: ARTHROSCOPY KNEE;  Surgeon: Bradley Ferris III;  Location: [place];  Service: Orthopedics;  Laterality: Right;  WITH MEDIAL MENISECTOMY AND removal of suprapatella fat lump  . LEFT WRIST TENOSYNECTOMY W/ LEFT THUMB JOINT REPAIR  02-24-10  . LUMBAR FUSION  11-30-10   L4 - 5  . LUMBAR FUSION  2000   L2 - 4  . LUMBAR LAMINECTOMY  DEC 2011   L4 - 5  . POSTERIOR CERVICAL FUSION/FORAMINOTOMY N/A 05/17/2017   Procedure: Cervical five  to Thorastic one  Posterior cervical fusion with lateral mass screws/revision of prior instrumentation;  Surgeon: Erline Levine, MD;  Location: [place];  Service: Neurosurgery;  Laterality: N/A;  C5 to T1 Posterior cervical fusion with lateral mass screws/revision of prior instrumentation  . TENDON REPAIR  JAN 2010   LEFT INDEX AND LONG FINGERS  . TUBAL LIGATION     Past Medical History:  Diagnosis Date  . Acute sinusitis, unspecified   . Anemia    "quite a few times"  . Anxiety state,  unspecified   . Arachnoiditis BILATERAL LEGS   DUE TO MULTIPLE BACK SURG.'S  . Arthritis   . Asthma    last flare up was 03/2017 lasted over a month  . Blood transfusion   . Cancer ([place])    skin cancers (in scalp)  . Cardiomyopathy HX --06/2010   EF was 25% during acute illness (PHELONEPHRITIS) Repeat echo 12-06-10 60% showed normal EF.   Marland Kitchen Chronic back pain greater  than 3 months duration    S/P BACK SURG'S  . CSF leak   . Diabetes mellitus without complication ([place])    dx 2013 type 2  . Dyslipidemia   . Dysphagia    some post op cerv fusion 2/14  . Dysrhythmia   . Essential hypertension, benign   . Family history of adverse reaction to anesthesia    mother gets n/v  . GERD (gastroesophageal reflux disease) AND HIATIAL HERNIA   CONTROLLED W/ NEXIUM  . Headache(784.0)   . Heart murmur    DENIES S & S   (ECHO JUN'12 W/ CHART)  . History of chronic bronchitis   . Hx of bladder infections   . Hypertension   . Murmur, heart   . Neuromuscular disorder (HCC)    numbness and tingling  . Osteoporosis   . Other malaise and fatigue   . PONV (postoperative nausea and vomiting)   . Shortness of breath   . Spinal headache   . Spinal stenosis, cervical region   . Varicosities    venous  . Weakness of both legs DUE TO ARACHNOIDITIS   OCCASIONAL USES CANE   BP 123/66   Pulse 89   Ht 5\' 5"  (1.651 m)   Wt 195 lb (88.5 kg)   SpO2 95%   BMI 32.45 kg/m   Opioid Risk Score:   Fall Risk Score:  `1  Depression screen PHQ 2/9  Depression screen [facility] 2/9 10/25/2017 09/27/2017 07/02/2017 06/04/2017 05/03/2017 03/05/2017 01/05/2017  Decreased Interest 0 0 1 1 0 0 0  Down, Depressed, Hopeless 0 0 1 1 0 0 0  PHQ - 2 Score 0 0 2 2 0 0 0  Altered sleeping - 0 - - - - -  Tired, decreased energy - 0 - - - - -  Change in appetite - 0 - - - - -  Feeling bad or failure about yourself  - 0 - - - - -  Trouble concentrating - 0 - - - - -  Moving slowly or fidgety/restless - 0 - - - - -  Suicidal  thoughts - 0 - - - - -  PHQ-9 Score - 0 - - - - -  Some recent data might be hidden     Review of Systems  Constitutional: Negative.   HENT: Negative.   Eyes: Negative.   Respiratory: Negative.   Cardiovascular: Negative.   Gastrointestinal: Negative.   Endocrine: Negative.   Genitourinary: Negative.   Musculoskeletal: Positive for arthralgias, back pain, gait problem, myalgias and neck pain.  Skin: Negative.   Allergic/Immunologic: Negative.   Hematological: Negative.   Psychiatric/Behavioral: Negative.   All other systems reviewed and are negative.      Objective:   Physical Exam   General: No acute distress HEENT: EOMI, oral membranes moist Cards: reg rate  Chest: normal effort Abdomen: Soft, NT, ND Skin: dry, intact Extremities: no edema   Musculoskeletal:Low back tender to palpation and basic range of motion which is quite limited  Neurological: [name] [name] isalertand oriented to person, place, and time.  UE Motor: 4/5 shoulders, biceps, triceps, 3+ to 4- HI Balance functional with cane. LE: 4- to 4/5 Skin: Skin iswarmand dry.  Psychiatric: in good spirits.   Can be anxious at times.     Assessment & Plan:  1. On 05/17/2017 :C5C6C7 T1 Posterior Cervical Fusion with lateral mass fixation with AIRO Imaging, revision of prior instrumentation. By Dr. Vertell Limber.            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             -Continue Gabapentin as written              -refilled Oxycodone 15mg  one tablet every 6 hours as needed #120             -Oxymorphone HCL 40 mg every 12 hours #60. refilled            -We will continue the controlled substance monitoring program, this consists of regular clinic visits, examinations, routine drug screening, pill counts as well as use of [place] Controlled Substance Reporting System. [place].   2. 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 Instructed to call in a week, [name] [name] verbalizes understanding. We will continueGabapentin 600mg  QID/ Alternate Days.  Continue to monitor.   3. Anxiety/depression: Continue Valium,  -will dc effexor and begin a trial of celexa 20mg  qhs. This may help a bit more with her anxiety. I have seen effexor worsen anxiety for some patients as well.   [name] [name] monitor. 07/02/2017 4. Muscle Spasms: Continue Flexeril. Continue to Monitor.   Spoke with her about the situation with her mother. [name] [name] needs to get help with her care. [name] [name] has enough of her own  Balance issues to be expected to provide physical care for her mother.     15 minutes of face to face patient care time was spent during this visit. All questions were encouraged and answered.  F/U in 1 month with NP

## 2017-11-27 DIAGNOSIS — M25561 Pain in right knee: Secondary | ICD-10-CM | POA: Diagnosis not present

## 2017-12-10 ENCOUNTER — Telehealth: Payer: Self-pay | Admitting: Registered Nurse

## 2017-12-10 DIAGNOSIS — G039 Meningitis, unspecified: Secondary | ICD-10-CM

## 2017-12-10 DIAGNOSIS — M961 Postlaminectomy syndrome, not elsewhere classified: Secondary | ICD-10-CM

## 2017-12-10 MED ORDER — OXYCODONE HCL 15 MG PO TABS
15.0000 mg | ORAL_TABLET | Freq: Four times a day (QID) | ORAL | 0 refills | Status: DC | PRN
Start: 2017-12-10 — End: 2018-01-01

## 2017-12-10 MED ORDER — OXYMORPHONE HCL ER 40 MG PO T12A
40.0000 mg | EXTENDED_RELEASE_TABLET | Freq: Two times a day (BID) | ORAL | 0 refills | Status: DC
Start: 2017-12-10 — End: 2018-01-01

## 2017-12-10 NOTE — Telephone Encounter (Signed)
Placed a call to [name] [name] regarding her schedule change. Medication e-scribe, she verbalizes understanding.

## 2017-12-13 ENCOUNTER — Ambulatory Visit: Payer: Self-pay | Admitting: Neurology

## 2017-12-21 ENCOUNTER — Encounter: Payer: Medicare Other | Admitting: Registered Nurse

## 2017-12-24 ENCOUNTER — Ambulatory Visit: Payer: Medicare Other | Admitting: Registered Nurse

## 2018-01-01 ENCOUNTER — Encounter: Payer: Self-pay | Admitting: Registered Nurse

## 2018-01-01 ENCOUNTER — Encounter: Payer: Medicare Other | Attending: Physical Medicine & Rehabilitation | Admitting: Registered Nurse

## 2018-01-01 ENCOUNTER — Encounter: Payer: Self-pay | Admitting: Internal Medicine

## 2018-01-01 VITALS — BP 103/65 | HR 88 | Resp 14 | Ht 65.0 in | Wt 195.0 lb

## 2018-01-01 DIAGNOSIS — G039 Meningitis, unspecified: Secondary | ICD-10-CM | POA: Diagnosis not present

## 2018-01-01 DIAGNOSIS — M5416 Radiculopathy, lumbar region: Secondary | ICD-10-CM

## 2018-01-01 DIAGNOSIS — M5441 Lumbago with sciatica, right side: Secondary | ICD-10-CM | POA: Diagnosis not present

## 2018-01-01 DIAGNOSIS — M546 Pain in thoracic spine: Secondary | ICD-10-CM

## 2018-01-01 DIAGNOSIS — M5412 Radiculopathy, cervical region: Secondary | ICD-10-CM

## 2018-01-01 DIAGNOSIS — M542 Cervicalgia: Secondary | ICD-10-CM | POA: Diagnosis not present

## 2018-01-01 DIAGNOSIS — Z5181 Encounter for therapeutic drug level monitoring: Secondary | ICD-10-CM | POA: Diagnosis not present

## 2018-01-01 DIAGNOSIS — R609 Edema, unspecified: Secondary | ICD-10-CM | POA: Diagnosis not present

## 2018-01-01 DIAGNOSIS — M961 Postlaminectomy syndrome, not elsewhere classified: Secondary | ICD-10-CM | POA: Diagnosis not present

## 2018-01-01 DIAGNOSIS — Z981 Arthrodesis status: Secondary | ICD-10-CM | POA: Diagnosis not present

## 2018-01-01 DIAGNOSIS — Z79899 Other long term (current) drug therapy: Secondary | ICD-10-CM | POA: Insufficient documentation

## 2018-01-01 MED ORDER — OXYMORPHONE HCL ER 40 MG PO T12A: 40.0000 mg | EXTENDED_RELEASE_TABLET | Freq: Two times a day (BID) | ORAL | 0 refills | Status: DC

## 2018-01-01 MED ORDER — OXYCODONE HCL 15 MG PO TABS: 15.0000 mg | ORAL_TABLET | Freq: Four times a day (QID) | ORAL | 0 refills | Status: DC | PRN

## 2018-01-01 NOTE — Progress Notes (Signed)
Subjective:    Patient ID: [name] [name], female    DOB: 1950-07-17, 67 y.o.   MRN: [id number]  HPI: [name] [name] is a 67 year old female who returns for follow up appointment for chronic pain and medication refill. She states her pain is located in her neck radiating into her bilateral shoulders,mid-lower back radiating into her buttocks and bilateral lower extremities. Also reports increase intensity of lower extremity pain and she is unable to stand >10-15 minutes and bilateral lower extremities with edema noted. She was instructed to follow up with her PCP, Cardiologist and neurosurgeon, she verbalizes understanding.  She denies falling this month, encouraged to continue to use her cane for support she verbalizes understanding.   [name] [name] Morphine Equivalent is 330.00 MME. .  She is also prescribed diazepam by Dr. Sherrie Sport .We have discussed the black box warning of using opioids and benzodiazepines. I highlighted the dangers of using these drugs together and discussed the adverse events including respiratory suppression, overdose, cognitive impairment and importance of compliance with current regimen. We will continue to monitor and adjust as indicated.   Last UDS  was Performed on 09/27/2017, it was consistent.    Pain Inventory Average Pain 7 Pain Right Now 9 My pain is sharp, burning, tingling and aching  In the last 24 hours, has pain interfered with the following? General activity 8 Relation with others 7 Enjoyment of life 8 What TIME of day is your pain at its worst? na Sleep (in general) na  Pain is worse with: unsure Pain improves with: medication Relief from Meds: na  Mobility Do you have any goals in this area?  no  Function Do you have any goals in this area?  no  Neuro/Psych No problems in this area  Prior Studies Any changes since last visit?  no  Physicians involved in your care Any changes since last visit?  no   Family History  Problem Relation  Age of Onset  . Hypertension Mother   . Heart disease Mother   . Diabetes Mother   . Cancer Father   . Diabetes Brother    Social History   Socioeconomic History  . Marital status: Divorced    Spouse name: Not on file  . Number of children: Not on file  . Years of education: Not on file  . Highest education level: Not on file  Occupational History  . Occupation: disable    Employer: DISABLED  Social Needs  . Financial resource strain: Not on file  . Food insecurity:    Worry: Not on file    Inability: Not on file  . Transportation needs:    Medical: Not on file    Non-medical: Not on file  Tobacco Use  . Smoking status: Never Smoker  . Smokeless tobacco: Never Used  Substance and Sexual Activity  . Alcohol use: No  . Drug use: No  . Sexual activity: Not on file    Comment: second hand smoke  Lifestyle  . Physical activity:    Days per week: Not on file    Minutes per session: Not on file  . Stress: Not on file  Relationships  . Social connections:    Talks on phone: Not on file    Gets together: Not on file    Attends religious service: Not on file    Active member of club or organization: Not on file    Attends meetings of clubs or organizations: Not on  file    Relationship status: Not on file  Other Topics Concern  . Not on file  Social History Narrative   Has one child   Disabled   Past Surgical History:  Procedure Laterality Date  . ABDOMINAL HYSTERECTOMY  1987   W/ BSO  . ANTERIOR CERVICAL DECOMP/DISCECTOMY FUSION N/A 08/09/2012   Procedure: ANTERIOR CERVICAL DECOMPRESSION/DISCECTOMY FUSION 1 LEVEL;  Surgeon: Floyce Stakes, MD;  Location: [facility];  Service: Neurosurgery;  Laterality: N/A;  Cervical four-five  Anterior cervical decompression/diskectomy/fusion  . ANTERIOR FUSION CERVICAL SPINE  2007   C5 -6  . APPENDECTOMY    . APPLICATION OF INTRAOPERATIVE CT SCAN N/A 05/17/2017   Procedure: APPLICATION OF INTRAOPERATIVE CT SCAN;  Surgeon:  Erline Levine, MD;  Location: [place];  Service: Neurosurgery;  Laterality: N/A;  . BACK SURGERY     x 5  . BREAST EXCISIONAL BIOPSY Bilateral    No scar seen   . BREAST SURGERY     x 2 biopsies  . CARPAL TUNNEL RELEASE  RIGHT - 2001  & DEC 2011 W/ BACK SURG.  . CERVICAL DISC SURGERY  2005   C5 - 6  . CHOLECYSTECTOMY  1994  . DORSAL COMPARTMENT RELEASE Right 04/07/2013   Procedure: RIGHT WRIST STS RELEASE;  Surgeon: Schuyler Amor, MD;  Location: [place];  Service: Orthopedics;  Laterality: Right;  . EXPLORATION OF INCISION FOR CSF LEAK  DEC 2011  X2   POST LAMINECOTMY  . FINGER ARTHRODESIS Right 04/07/2013   Procedure: RIGHT INDEX AND RIGHT LONG DISTAL INTERPHALANGEAL JOINT FUSIONS;  Surgeon: Schuyler Amor, MD;  Location: [place];  Service: Orthopedics;  Laterality: Right;  . FINGER ARTHROPLASTY Right 04/07/2013   Procedure: RIGHT THUMB [place] ARTHROPLASTY;  Surgeon: Schuyler Amor, MD;  Location: [place];  Service: Orthopedics;  Laterality: Right;  . GANGLION CYST EXCISION Right 04/07/2013   Procedure: RIGHT WRIST MASS EXCISION;  Surgeon: Schuyler Amor, MD;  Location: [place];  Service: Orthopedics;  Laterality: Right;  . KNEE ARTHROSCOPY  LEFT X3 (LAST ONE 2005)  . KNEE ARTHROSCOPY  05/17/2011   Procedure: ARTHROSCOPY KNEE;  Surgeon: Bradley Ferris III;  Location: [place];  Service: Orthopedics;  Laterality: Right;  WITH MEDIAL MENISECTOMY AND removal of suprapatella fat lump  . LEFT WRIST TENOSYNECTOMY W/ LEFT THUMB JOINT REPAIR  02-24-10  . LUMBAR FUSION  11-30-10   L4 - 5  . LUMBAR FUSION  2000   L2 - 4  . LUMBAR LAMINECTOMY  DEC 2011   L4 - 5  . POSTERIOR CERVICAL FUSION/FORAMINOTOMY N/A 05/17/2017   Procedure: Cervical five  to Thorastic one  Posterior cervical fusion with lateral mass screws/revision of prior instrumentation;  Surgeon: Erline Levine, MD;  Location: [place];   Service: Neurosurgery;  Laterality: N/A;  C5 to T1 Posterior cervical fusion with lateral mass screws/revision of prior instrumentation  . TENDON REPAIR  JAN 2010   LEFT INDEX AND LONG FINGERS  . TUBAL LIGATION     Past Medical History:  Diagnosis Date  . Acute sinusitis, unspecified   . Anemia    "quite a few times"  . Anxiety state, unspecified   . Arachnoiditis BILATERAL LEGS   DUE TO MULTIPLE BACK SURG.'S  . Arthritis   . Asthma    last flare up was 03/2017 lasted over a month  . Blood transfusion   . Cancer ([place])  skin cancers (in scalp)  . Cardiomyopathy HX --06/2010   EF was 25% during acute illness (PHELONEPHRITIS) Repeat echo 12-06-10 60% showed normal EF.   Marland Kitchen Chronic back pain greater than 3 months duration    S/P BACK SURG'S  . CSF leak   . Diabetes mellitus without complication ([place])    dx 2013 type 2  . Dyslipidemia   . Dysphagia    some post op cerv fusion 2/14  . Dysrhythmia   . Essential hypertension, benign   . Family history of adverse reaction to anesthesia    mother gets n/v  . GERD (gastroesophageal reflux disease) AND HIATIAL HERNIA   CONTROLLED W/ NEXIUM  . Headache(784.0)   . Heart murmur    DENIES S & S   (ECHO JUN'12 W/ CHART)  . History of chronic bronchitis   . Hx of bladder infections   . Hypertension   . Murmur, heart   . Neuromuscular disorder (HCC)    numbness and tingling  . Osteoporosis   . Other malaise and fatigue   . PONV (postoperative nausea and vomiting)   . Shortness of breath   . Spinal headache   . Spinal stenosis, cervical region   . Varicosities    venous  . Weakness of both legs DUE TO ARACHNOIDITIS   OCCASIONAL USES CANE   BP 103/65 (BP Location: Right Arm, Patient Position: Sitting, Cuff Size: Normal)   Pulse 88   Resp 14   Ht 5\' 5"  (1.651 m)   Wt 195 lb (88.5 kg)   SpO2 90%   BMI 32.45 kg/m   Opioid Risk Score:   Fall Risk Score:  `1  Depression screen PHQ 2/9  Depression screen [facility] 2/9 10/25/2017  09/27/2017 07/02/2017 06/04/2017 05/03/2017 03/05/2017 01/05/2017  Decreased Interest 0 0 1 1 0 0 0  Down, Depressed, Hopeless 0 0 1 1 0 0 0  PHQ - 2 Score 0 0 2 2 0 0 0  Altered sleeping - 0 - - - - -  Tired, decreased energy - 0 - - - - -  Change in appetite - 0 - - - - -  Feeling bad or failure about yourself  - 0 - - - - -  Trouble concentrating - 0 - - - - -  Moving slowly or fidgety/restless - 0 - - - - -  Suicidal thoughts - 0 - - - - -  PHQ-9 Score - 0 - - - - -  Some recent data might be hidden    Review of Systems  Constitutional: Negative.   HENT: Negative.   Eyes: Negative.   Respiratory: Negative.   Cardiovascular: Negative.   Gastrointestinal: Negative.   Endocrine: Negative.   Genitourinary: Negative.   Musculoskeletal: Positive for arthralgias, back pain, myalgias, neck pain and neck stiffness.  Skin: Negative.   Neurological: Negative.   Psychiatric/Behavioral: Negative.   All other systems reviewed and are negative.      Objective:   Physical Exam  Constitutional: She is oriented to person, place, and time. She appears well-developed and well-nourished.  HENT:  Head: Normocephalic and atraumatic.  Neck: Normal range of motion. Neck supple.  Cervical Paraspinal Tenderness: C-5-C-6  Cardiovascular: Normal rate and regular rhythm.  Pulmonary/Chest: Effort normal and breath sounds normal.  Musculoskeletal:  Normal Muscle Bulk and Muscle Testing Reveals: Upper Extremities: Full ROM and Muscle Strength 4/5 Bilateral AC Joint Tenderness Thoracic and Lumbar Hypersensitivity Lower Extremities: Full ROM and Muscle Strength 5/5 Bilateral Lower  Extremities with Pitting Edema Arises from Table Slowly using Cane for Support Antalgic gait  Neurological: She is alert and oriented to person, place, and time.  Skin: Skin is warm and dry.  Psychiatric: She has a normal mood and affect. Her behavior is normal.  Nursing note and vitals reviewed.         Assessment  & Plan:  1. On 05/17/2017 :C5C6C7 T1 Posterior Cervical Fusion with lateral mass fixation with AIRO Imaging, revision of prior instrumentation. By Dr. Vertell Limber.  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 Continuecurrent medication regimen with Gabapentin 800 mg QID. 01/02/2018. Refilled:Oxycodone 15mg  one tablet every 6 hours as needed # 120 and  Oxymorphone HCL 40 mg every 12 hours #60. We will continue the opioid monitoring program, this consists of regular clinic visits, examinations, urine drug screen, pill counts as well as use of [place] Controlled Substance Reporting System. 2. Lumbar arachnoiditis with chronic lower extremity neuropathic pain.Continue with  Gabapentin 800mg  QID Continue to Monitor. 01/02/2018 3. Anxiety/depression: Continue Valium, PCP Following. Continue Effexor. Continue to monitor. 01/02/2018 4. Muscle Spasms: Continuecurrent medication regime withFlexeril. Continue to Monitor. 01/02/2018 5. Cervicalgia/ Cervical Radiculitis: Continuecurrent medication regime withGabapentin: Dr. Vertell Limber Following: S/P C5C6C7 T1 Posterior Cervical Fusion with lateral mass fixation with AIRO Imaging, revision of prior instrumentation by Dr. Vertell Limber on 05/17/2017. 01/02/2018 6. Frequent Falls:No falls this month. Continue with Falls Prevention: Awaiting Neurology appointment.01/02/2018 7. Pitting Edema: Follow up with PCP and Cardiologist, she verbalizes understanding.   30 minutes of face to face patient care time was spent during this visit. All questions were encouraged and answered.  F/U in 1 month

## 2018-01-14 DIAGNOSIS — M5412 Radiculopathy, cervical region: Secondary | ICD-10-CM | POA: Diagnosis not present

## 2018-01-14 DIAGNOSIS — M542 Cervicalgia: Secondary | ICD-10-CM | POA: Diagnosis not present

## 2018-01-14 DIAGNOSIS — G039 Meningitis, unspecified: Secondary | ICD-10-CM | POA: Diagnosis not present

## 2018-01-14 DIAGNOSIS — M545 Low back pain: Secondary | ICD-10-CM | POA: Diagnosis not present

## 2018-01-14 DIAGNOSIS — I10 Essential (primary) hypertension: Secondary | ICD-10-CM | POA: Diagnosis not present

## 2018-01-14 DIAGNOSIS — M5416 Radiculopathy, lumbar region: Secondary | ICD-10-CM | POA: Diagnosis not present

## 2018-01-15 DIAGNOSIS — I10 Essential (primary) hypertension: Secondary | ICD-10-CM | POA: Diagnosis not present

## 2018-01-15 DIAGNOSIS — E1165 Type 2 diabetes mellitus with hyperglycemia: Secondary | ICD-10-CM | POA: Diagnosis not present

## 2018-01-15 DIAGNOSIS — J441 Chronic obstructive pulmonary disease with (acute) exacerbation: Secondary | ICD-10-CM | POA: Diagnosis not present

## 2018-01-21 ENCOUNTER — Other Ambulatory Visit: Payer: Self-pay | Admitting: Physical Medicine & Rehabilitation

## 2018-01-21 DIAGNOSIS — M5416 Radiculopathy, lumbar region: Secondary | ICD-10-CM

## 2018-01-21 DIAGNOSIS — M961 Postlaminectomy syndrome, not elsewhere classified: Secondary | ICD-10-CM

## 2018-01-28 DIAGNOSIS — E1141 Type 2 diabetes mellitus with diabetic mononeuropathy: Secondary | ICD-10-CM | POA: Diagnosis not present

## 2018-01-28 DIAGNOSIS — I872 Venous insufficiency (chronic) (peripheral): Secondary | ICD-10-CM | POA: Diagnosis not present

## 2018-01-28 DIAGNOSIS — E0841 Diabetes mellitus due to underlying condition with diabetic mononeuropathy: Secondary | ICD-10-CM | POA: Diagnosis not present

## 2018-01-28 DIAGNOSIS — M545 Low back pain: Secondary | ICD-10-CM | POA: Diagnosis not present

## 2018-01-31 ENCOUNTER — Other Ambulatory Visit: Payer: Self-pay | Admitting: Neurosurgery

## 2018-01-31 DIAGNOSIS — M545 Low back pain: Principal | ICD-10-CM

## 2018-01-31 DIAGNOSIS — G8929 Other chronic pain: Secondary | ICD-10-CM

## 2018-02-07 ENCOUNTER — Ambulatory Visit: Payer: Medicare Other | Admitting: Neurology

## 2018-02-14 ENCOUNTER — Encounter: Payer: Medicare Other | Attending: Physical Medicine & Rehabilitation | Admitting: Registered Nurse

## 2018-02-14 ENCOUNTER — Encounter: Payer: Self-pay | Admitting: Registered Nurse

## 2018-02-14 VITALS — BP 133/82 | HR 70 | Resp 14 | Ht 65.0 in | Wt 191.0 lb

## 2018-02-14 DIAGNOSIS — M961 Postlaminectomy syndrome, not elsewhere classified: Secondary | ICD-10-CM

## 2018-02-14 DIAGNOSIS — R609 Edema, unspecified: Secondary | ICD-10-CM

## 2018-02-14 DIAGNOSIS — M5416 Radiculopathy, lumbar region: Secondary | ICD-10-CM

## 2018-02-14 DIAGNOSIS — M542 Cervicalgia: Secondary | ICD-10-CM

## 2018-02-14 DIAGNOSIS — G894 Chronic pain syndrome: Secondary | ICD-10-CM | POA: Diagnosis not present

## 2018-02-14 DIAGNOSIS — G039 Meningitis, unspecified: Secondary | ICD-10-CM

## 2018-02-14 DIAGNOSIS — M546 Pain in thoracic spine: Secondary | ICD-10-CM | POA: Diagnosis not present

## 2018-02-14 DIAGNOSIS — Z5181 Encounter for therapeutic drug level monitoring: Secondary | ICD-10-CM

## 2018-02-14 DIAGNOSIS — Z79899 Other long term (current) drug therapy: Secondary | ICD-10-CM | POA: Insufficient documentation

## 2018-02-14 DIAGNOSIS — M5441 Lumbago with sciatica, right side: Secondary | ICD-10-CM | POA: Insufficient documentation

## 2018-02-14 DIAGNOSIS — M5412 Radiculopathy, cervical region: Secondary | ICD-10-CM

## 2018-02-14 DIAGNOSIS — Z981 Arthrodesis status: Secondary | ICD-10-CM

## 2018-02-14 DIAGNOSIS — Z79891 Long term (current) use of opiate analgesic: Secondary | ICD-10-CM | POA: Diagnosis not present

## 2018-02-14 MED ORDER — OXYCODONE HCL 15 MG PO TABS: 15.0000 mg | ORAL_TABLET | Freq: Four times a day (QID) | ORAL | 0 refills | Status: DC | PRN

## 2018-02-14 MED ORDER — OXYMORPHONE HCL ER 40 MG PO T12A: 40.0000 mg | EXTENDED_RELEASE_TABLET | Freq: Two times a day (BID) | ORAL | 0 refills | Status: DC

## 2018-02-14 NOTE — Progress Notes (Signed)
Subjective:    Patient ID: [name] [name], female    DOB: [birth year], 66 y.o.   MRN: [id number]  HPI: [name] [name] is a 67 year old female who returns for follow up appointment for chronic pain and medication refill. She states her pain is located in her neck radiating into her bilateral shoulders, lower back radiating into her bilateral lower extremities, and bilateral hip pain. She rates her pain 9. Her current exercise regime is walking.   [name] [name] is scheduled for a Myelogram on 02/19/2018 ordered by Dr. Vertell Limber.  [name] [name] Morphine Equivalent is 330.00 MME. She is also prescribed Diazepam by Dr. Sherrie Sport. We have discussed the black box warning of using opioids and benzodiazepines. I highlighted the dangers of using these drugs together and discussed the adverse events including respiratory suppression, overdose, cognitive impairment and importance of compliance with current regimen. We will continue to monitor and adjust as indicated.   Last UDS was Performed on 09/27/2017, it was consistent. UDS ordered today.   Pain Inventory Average Pain 8 Pain Right Now 9 My pain is sharp, burning, stabbing, tingling and aching  In the last 24 hours, has pain interfered with the following? General activity 8 Relation with others 8 Enjoyment of life 8 What TIME of day is your pain at its worst? all Sleep (in general) Fair  Pain is worse with: walking, sitting, inactivity, standing and some activites Pain improves with: rest and medication Relief from Meds: 7  Mobility walk without assistance walk with assistance use a cane how many minutes can you walk? 15 ability to climb steps?  yes do you drive?  yes  Function retired  Neuro/Psych trouble walking  Prior Studies Any changes since last visit?  no  Physicians involved in your care Any changes since last visit?  no   Family History  Problem Relation Age of Onset  . Hypertension Mother   . Heart disease Mother   .  Diabetes Mother   . Cancer Father   . Diabetes Brother    Social History   Socioeconomic History  . Marital status: Divorced    Spouse name: Not on file  . Number of children: Not on file  . Years of education: Not on file  . Highest education level: Not on file  Occupational History  . Occupation: disable    Employer: DISABLED  Social Needs  . Financial resource strain: Not on file  . Food insecurity:    Worry: Not on file    Inability: Not on file  . Transportation needs:    Medical: Not on file    Non-medical: Not on file  Tobacco Use  . Smoking status: Never Smoker  . Smokeless tobacco: Never Used  Substance and Sexual Activity  . Alcohol use: No  . Drug use: No  . Sexual activity: Not on file    Comment: second hand smoke  Lifestyle  . Physical activity:    Days per week: Not on file    Minutes per session: Not on file  . Stress: Not on file  Relationships  . Social connections:    Talks on phone: Not on file    Gets together: Not on file    Attends religious service: Not on file    Active member of club or organization: Not on file    Attends meetings of clubs or organizations: Not on file    Relationship status: Not on file  Other Topics Concern  .  Not on file  Social History Narrative   Has one child   Disabled   Past Surgical History:  Procedure Laterality Date  . ABDOMINAL HYSTERECTOMY  1987   W/ BSO  . ANTERIOR CERVICAL DECOMP/DISCECTOMY FUSION N/A 08/09/2012   Procedure: ANTERIOR CERVICAL DECOMPRESSION/DISCECTOMY FUSION 1 LEVEL;  Surgeon: Floyce Stakes, MD;  Location: [facility];  Service: Neurosurgery;  Laterality: N/A;  Cervical four-five  Anterior cervical decompression/diskectomy/fusion  . ANTERIOR FUSION CERVICAL SPINE  2007   C5 -6  . APPENDECTOMY    . APPLICATION OF INTRAOPERATIVE CT SCAN N/A 05/17/2017   Procedure: APPLICATION OF INTRAOPERATIVE CT SCAN;  Surgeon: Erline Levine, MD;  Location: [place];  Service: Neurosurgery;   Laterality: N/A;  . BACK SURGERY     x 5  . BREAST EXCISIONAL BIOPSY Bilateral    No scar seen   . BREAST SURGERY     x 2 biopsies  . CARPAL TUNNEL RELEASE  RIGHT - 2001  & DEC 2011 W/ BACK SURG.  . CERVICAL DISC SURGERY  2005   C5 - 6  . CHOLECYSTECTOMY  1994  . DORSAL COMPARTMENT RELEASE Right 04/07/2013   Procedure: RIGHT WRIST STS RELEASE;  Surgeon: Schuyler Amor, MD;  Location: [place];  Service: Orthopedics;  Laterality: Right;  . EXPLORATION OF INCISION FOR CSF LEAK  DEC 2011  X2   POST LAMINECOTMY  . FINGER ARTHRODESIS Right 04/07/2013   Procedure: RIGHT INDEX AND RIGHT LONG DISTAL INTERPHALANGEAL JOINT FUSIONS;  Surgeon: Schuyler Amor, MD;  Location: [place];  Service: Orthopedics;  Laterality: Right;  . FINGER ARTHROPLASTY Right 04/07/2013   Procedure: RIGHT THUMB [place] ARTHROPLASTY;  Surgeon: Schuyler Amor, MD;  Location: [place];  Service: Orthopedics;  Laterality: Right;  . GANGLION CYST EXCISION Right 04/07/2013   Procedure: RIGHT WRIST MASS EXCISION;  Surgeon: Schuyler Amor, MD;  Location: [place];  Service: Orthopedics;  Laterality: Right;  . KNEE ARTHROSCOPY  LEFT X3 (LAST ONE 2005)  . KNEE ARTHROSCOPY  05/17/2011   Procedure: ARTHROSCOPY KNEE;  Surgeon: Bradley Ferris III;  Location: [place];  Service: Orthopedics;  Laterality: Right;  WITH MEDIAL MENISECTOMY AND removal of suprapatella fat lump  . LEFT WRIST TENOSYNECTOMY W/ LEFT THUMB JOINT REPAIR  02-24-10  . LUMBAR FUSION  11-30-10   L4 - 5  . LUMBAR FUSION  2000   L2 - 4  . LUMBAR LAMINECTOMY  DEC 2011   L4 - 5  . POSTERIOR CERVICAL FUSION/FORAMINOTOMY N/A 05/17/2017   Procedure: Cervical five  to Thorastic one  Posterior cervical fusion with lateral mass screws/revision of prior instrumentation;  Surgeon: Erline Levine, MD;  Location: [place];  Service: Neurosurgery;  Laterality: N/A;  C5 to T1 Posterior  cervical fusion with lateral mass screws/revision of prior instrumentation  . TENDON REPAIR  JAN 2010   LEFT INDEX AND LONG FINGERS  . TUBAL LIGATION     Past Medical History:  Diagnosis Date  . Acute sinusitis, unspecified   . Anemia    "quite a few times"  . Anxiety state, unspecified   . Arachnoiditis BILATERAL LEGS   DUE TO MULTIPLE BACK SURG.'S  . Arthritis   . Asthma    last flare up was 03/2017 lasted over a month  . Blood transfusion   . Cancer ([place])    skin cancers (in scalp)  . Cardiomyopathy HX --06/2010   EF was 25% during  acute illness (PHELONEPHRITIS) Repeat echo 12-06-10 60% showed normal EF.   Marland Kitchen Chronic back pain greater than 3 months duration    S/P BACK SURG'S  . CSF leak   . Diabetes mellitus without complication ([place])    dx 2013 type 2  . Dyslipidemia   . Dysphagia    some post op cerv fusion 2/14  . Dysrhythmia   . Essential hypertension, benign   . Family history of adverse reaction to anesthesia    mother gets n/v  . GERD (gastroesophageal reflux disease) AND HIATIAL HERNIA   CONTROLLED W/ NEXIUM  . Headache(784.0)   . Heart murmur    DENIES S & S   (ECHO JUN'12 W/ CHART)  . History of chronic bronchitis   . Hx of bladder infections   . Hypertension   . Murmur, heart   . Neuromuscular disorder (HCC)    numbness and tingling  . Osteoporosis   . Other malaise and fatigue   . PONV (postoperative nausea and vomiting)   . Shortness of breath   . Spinal headache   . Spinal stenosis, cervical region   . Varicosities    venous  . Weakness of both legs DUE TO ARACHNOIDITIS   OCCASIONAL USES CANE   BP 133/82 (BP Location: Right Wrist, Patient Position: Sitting, Cuff Size: Normal)   Pulse 70   Resp 14   Ht 5\' 5"  (1.651 m)   Wt 191 lb (86.6 kg)   SpO2 95%   BMI 31.78 kg/m   Opioid Risk Score:   Fall Risk Score:  `1  Depression screen PHQ 2/9  Depression screen [facility] 2/9 10/25/2017 09/27/2017 07/02/2017 06/04/2017 05/03/2017 03/05/2017 01/05/2017   Decreased Interest 0 0 1 1 0 0 0  Down, Depressed, Hopeless 0 0 1 1 0 0 0  PHQ - 2 Score 0 0 2 2 0 0 0  Altered sleeping - 0 - - - - -  Tired, decreased energy - 0 - - - - -  Change in appetite - 0 - - - - -  Feeling bad or failure about yourself  - 0 - - - - -  Trouble concentrating - 0 - - - - -  Moving slowly or fidgety/restless - 0 - - - - -  Suicidal thoughts - 0 - - - - -  PHQ-9 Score - 0 - - - - -  Some recent data might be hidden    Review of Systems  Constitutional: Negative.   HENT: Negative.   Eyes: Negative.   Respiratory: Negative.   Cardiovascular: Negative.   Gastrointestinal: Negative.   Endocrine: Negative.   Genitourinary: Negative.   Musculoskeletal: Positive for back pain and gait problem.  Skin: Negative.   Allergic/Immunologic: Negative.   Neurological: Positive for headaches.  Hematological: Negative.        Objective:   Physical Exam  Constitutional: She is oriented to person, place, and time. She appears well-developed and well-nourished.  HENT:  Head: Normocephalic and atraumatic.  Neck: Normal range of motion. Neck supple.  Cervical Paraspinal Tenderness: C-5-6  Cardiovascular: Normal rate and regular rhythm.  Pulmonary/Chest: Effort normal and breath sounds normal.  Musculoskeletal: She exhibits edema.  Normal Muscle Bulk and Muscle Testing Reveals: Upper Extremities: Full ROM and Muscle Strength 4/5 Bilateral AC Joint Tenderness Thoracic and Lumbar Hypersensitivity Lower Extremities: Full ROM and Muscle Strength 5/5 Arises from Table Slowly Narrow Based Gait  Neurological: She is alert and oriented to person, place, and time.  Skin: Skin is warm and dry.  Psychiatric: She has a normal mood and affect. Her behavior is normal.  Nursing note and vitals reviewed.         Assessment & Plan:  1. On 05/17/2017 :C5C6C7 T1 Posterior Cervical Fusion with lateral mass fixation with AIRO Imaging, revision of prior instrumentation. By Dr.  Vertell Limber.  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 Continuecurrent medication regimen withGabapentin800 mg QID.02/14/2018. Refilled:Oxycodone 15mg  one tablet every 6 hours as needed # 120 and  Oxymorphone HCL 40 mg every 12 hours #60. We will continue the opioid monitoring program, this consists of regular clinic visits, examinations, urine drug screen, pill counts as well as use of [place] Controlled Substance Reporting System. 2. 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 02/14/2018 3. Anxiety/depression: Continue Valium, PCP Following. Continue Effexor. Continue to monitor. 02/14/2018 4. Muscle Spasms: Continuecurrent medication regime withFlexeril. Continue to Monitor. 02/14/2018 5. Cervicalgia/ Cervical Radiculitis: Continuecurrent medication regime withGabapentin: Dr. Vertell Limber Following: S/P C5C6C7 T1 Posterior Cervical Fusion with lateral mass fixation with AIRO Imaging, revision of prior instrumentation by Dr. Vertell Limber on 05/17/2017. 02/14/2018 6. Frequent Falls:No falls this month. Continue with Falls Prevention: Awaiting Neurology appointment.02/14/2018 7. Pitting Edema: PCP Following. 02/14/2018  30 minutes of face to face patient care time was spent during this visit. All questions were encouraged and answered.  F/U in 1 month

## 2018-02-19 ENCOUNTER — Other Ambulatory Visit: Payer: Medicare Other

## 2018-02-19 ENCOUNTER — Inpatient Hospital Stay
Admission: RE | Admit: 2018-02-19 | Discharge: 2018-02-19 | Disposition: A | Discharge status: Home / Self Care | Payer: Medicare Other | Source: Ambulatory Visit | Attending: Neurosurgery | Admitting: Neurosurgery

## 2018-02-19 NOTE — Discharge Instructions (Signed)

## 2018-02-20 DIAGNOSIS — M545 Low back pain: Secondary | ICD-10-CM | POA: Diagnosis not present

## 2018-02-20 DIAGNOSIS — I10 Essential (primary) hypertension: Secondary | ICD-10-CM | POA: Diagnosis not present

## 2018-02-20 DIAGNOSIS — E1141 Type 2 diabetes mellitus with diabetic mononeuropathy: Secondary | ICD-10-CM | POA: Diagnosis not present

## 2018-02-20 LAB — TOXASSURE SELECT,+ANTIDEPR,UR

## 2018-02-22 ENCOUNTER — Telehealth: Payer: Self-pay | Admitting: *Deleted

## 2018-02-22 NOTE — Telephone Encounter (Signed)
Urine drug screen for this encounter is consistent for prescribed medication 

## 2018-03-01 ENCOUNTER — Other Ambulatory Visit: Payer: Medicare Other

## 2018-03-01 ENCOUNTER — Inpatient Hospital Stay
Admission: RE | Admit: 2018-03-01 | Discharge: 2018-03-01 | Disposition: A | Discharge status: Home / Self Care | Payer: Medicare Other | Source: Ambulatory Visit | Attending: Neurosurgery | Admitting: Neurosurgery

## 2018-03-12 ENCOUNTER — Encounter: Payer: Medicare Other | Attending: Physical Medicine & Rehabilitation | Admitting: Registered Nurse

## 2018-03-12 ENCOUNTER — Other Ambulatory Visit: Payer: Self-pay

## 2018-03-12 ENCOUNTER — Encounter: Payer: Self-pay | Admitting: Registered Nurse

## 2018-03-12 VITALS — BP 123/82 | HR 81 | Ht 65.0 in

## 2018-03-12 DIAGNOSIS — M5441 Lumbago with sciatica, right side: Secondary | ICD-10-CM | POA: Diagnosis not present

## 2018-03-12 DIAGNOSIS — R609 Edema, unspecified: Secondary | ICD-10-CM

## 2018-03-12 DIAGNOSIS — Z79891 Long term (current) use of opiate analgesic: Secondary | ICD-10-CM | POA: Diagnosis not present

## 2018-03-12 DIAGNOSIS — M961 Postlaminectomy syndrome, not elsewhere classified: Secondary | ICD-10-CM | POA: Insufficient documentation

## 2018-03-12 DIAGNOSIS — G039 Meningitis, unspecified: Secondary | ICD-10-CM

## 2018-03-12 DIAGNOSIS — Z79899 Other long term (current) drug therapy: Secondary | ICD-10-CM | POA: Insufficient documentation

## 2018-03-12 DIAGNOSIS — M5412 Radiculopathy, cervical region: Secondary | ICD-10-CM

## 2018-03-12 DIAGNOSIS — G894 Chronic pain syndrome: Secondary | ICD-10-CM

## 2018-03-12 DIAGNOSIS — Z981 Arthrodesis status: Secondary | ICD-10-CM | POA: Diagnosis not present

## 2018-03-12 DIAGNOSIS — Z5181 Encounter for therapeutic drug level monitoring: Secondary | ICD-10-CM | POA: Insufficient documentation

## 2018-03-12 DIAGNOSIS — M62838 Other muscle spasm: Secondary | ICD-10-CM

## 2018-03-12 DIAGNOSIS — M542 Cervicalgia: Secondary | ICD-10-CM | POA: Diagnosis not present

## 2018-03-12 DIAGNOSIS — M546 Pain in thoracic spine: Secondary | ICD-10-CM

## 2018-03-12 DIAGNOSIS — M5416 Radiculopathy, lumbar region: Secondary | ICD-10-CM

## 2018-03-12 MED ORDER — CITALOPRAM HYDROBROMIDE 20 MG PO TABS: 20.0000 mg | ORAL_TABLET | Freq: Every day | ORAL | 2 refills | Status: DC

## 2018-03-12 MED ORDER — OXYCODONE HCL 15 MG PO TABS: 15.0000 mg | ORAL_TABLET | Freq: Four times a day (QID) | ORAL | 0 refills | Status: DC | PRN

## 2018-03-12 MED ORDER — OXYMORPHONE HCL ER 40 MG PO T12A: 40.0000 mg | EXTENDED_RELEASE_TABLET | Freq: Two times a day (BID) | ORAL | 0 refills | Status: DC

## 2018-03-12 NOTE — Progress Notes (Signed)
Subjective:    Patient ID: [name] [name], female    DOB: 1950-08-16, 67 y.o.   MRN: [id number]  HPI: [name] [name] is a 67 year old female who returns for follow up appointment for chronic pain and medication refill. She states her pain is located in her neck radiating into her bilateral shoulders.upper-lower back radiating into her bilateral lower extremities and bilateral hips. She rates her pain 8. Her current exercise regime is walking.   [name] [name] reports a few days ago she was sitting in her bed and didn't realize how close she was sitting at the edge of the bed and slipped off the bed and landed on her left side. Also reports hitting her left arm against bed-stand, resolving ecchymosis noted. Educated on falls prevention she verbalizes understanding.   [name] [name] is 330.00 MME. She is also prescribed Diazepam by Dr. Sherrie Sport .We have discussed the black box warning of using opioids and benzodiazepines. I highlighted the dangers of using these drugs together and discussed the adverse events including respiratory suppression, overdose, cognitive impairment and importance of compliance with current regimen. We will continue to monitor and adjust as indicated.   Last UDS was Performed on 02/14/2018, it was consistent.   Pain Inventory Average Pain 8 Pain Right Now 8 My pain is sharp, burning, stabbing and tingling  In the last 24 hours, has pain interfered with the following? General activity 8 Relation with others 8 Enjoyment of life 8 What TIME of day is your pain at its worst? all Sleep (in general) Fair  Pain is worse with: walking, sitting, standing and some activites Pain improves with: heat/ice and medication Relief from Meds: 7  Mobility use a cane use a walker ability to climb steps?  yes do you drive?  yes  Function Do you have any goals in this area?  no  Neuro/Psych No problems in this area  Prior Studies Any changes since last visit?   no  Physicians involved in your care Any changes since last visit?  no   Family History  Problem Relation Age of Onset  . Hypertension Mother   . Heart disease Mother   . Diabetes Mother   . Cancer Father   . Diabetes Brother    Social History   Socioeconomic History  . Marital status: Divorced    Spouse name: Not on file  . Number of children: Not on file  . Years of education: Not on file  . Highest education level: Not on file  Occupational History  . Occupation: disable    Employer: DISABLED  Social Needs  . Financial resource strain: Not on file  . Food insecurity:    Worry: Not on file    Inability: Not on file  . Transportation needs:    Medical: Not on file    Non-medical: Not on file  Tobacco Use  . Smoking status: Never Smoker  . Smokeless tobacco: Never Used  Substance and Sexual Activity  . Alcohol use: No  . Drug use: No  . Sexual activity: Not on file    Comment: second hand smoke  Lifestyle  . Physical activity:    Days per week: Not on file    Minutes per session: Not on file  . Stress: Not on file  Relationships  . Social connections:    Talks on phone: Not on file    Gets together: Not on file    Attends religious service: Not on file  Active member of club or organization: Not on file    Attends meetings of clubs or organizations: Not on file    Relationship status: Not on file  Other Topics Concern  . Not on file  Social History Narrative   Has one child   Disabled   Past Surgical History:  Procedure Laterality Date  . ABDOMINAL HYSTERECTOMY  1987   W/ BSO  . ANTERIOR CERVICAL DECOMP/DISCECTOMY FUSION N/A 08/09/2012   Procedure: ANTERIOR CERVICAL DECOMPRESSION/DISCECTOMY FUSION 1 LEVEL;  Surgeon: Floyce Stakes, MD;  Location: [facility];  Service: Neurosurgery;  Laterality: N/A;  Cervical four-five  Anterior cervical decompression/diskectomy/fusion  . ANTERIOR FUSION CERVICAL SPINE  2007   C5 -6  . APPENDECTOMY    .  APPLICATION OF INTRAOPERATIVE CT SCAN N/A 05/17/2017   Procedure: APPLICATION OF INTRAOPERATIVE CT SCAN;  Surgeon: Erline Levine, MD;  Location: [place];  Service: Neurosurgery;  Laterality: N/A;  . BACK SURGERY     x 5  . BREAST EXCISIONAL BIOPSY Bilateral    No scar seen   . BREAST SURGERY     x 2 biopsies  . CARPAL TUNNEL RELEASE  RIGHT - 2001  & DEC 2011 W/ BACK SURG.  . CERVICAL DISC SURGERY  2005   C5 - 6  . CHOLECYSTECTOMY  1994  . DORSAL COMPARTMENT RELEASE Right 04/07/2013   Procedure: RIGHT WRIST STS RELEASE;  Surgeon: Schuyler Amor, MD;  Location: [place];  Service: Orthopedics;  Laterality: Right;  . EXPLORATION OF INCISION FOR CSF LEAK  DEC 2011  X2   POST LAMINECOTMY  . FINGER ARTHRODESIS Right 04/07/2013   Procedure: RIGHT INDEX AND RIGHT LONG DISTAL INTERPHALANGEAL JOINT FUSIONS;  Surgeon: Schuyler Amor, MD;  Location: [place];  Service: Orthopedics;  Laterality: Right;  . FINGER ARTHROPLASTY Right 04/07/2013   Procedure: RIGHT THUMB [place] ARTHROPLASTY;  Surgeon: Schuyler Amor, MD;  Location: [place];  Service: Orthopedics;  Laterality: Right;  . GANGLION CYST EXCISION Right 04/07/2013   Procedure: RIGHT WRIST MASS EXCISION;  Surgeon: Schuyler Amor, MD;  Location: [place];  Service: Orthopedics;  Laterality: Right;  . KNEE ARTHROSCOPY  LEFT X3 (LAST ONE 2005)  . KNEE ARTHROSCOPY  05/17/2011   Procedure: ARTHROSCOPY KNEE;  Surgeon: Bradley Ferris III;  Location: [place];  Service: Orthopedics;  Laterality: Right;  WITH MEDIAL MENISECTOMY AND removal of suprapatella fat lump  . LEFT WRIST TENOSYNECTOMY W/ LEFT THUMB JOINT REPAIR  02-24-10  . LUMBAR FUSION  11-30-10   L4 - 5  . LUMBAR FUSION  2000   L2 - 4  . LUMBAR LAMINECTOMY  DEC 2011   L4 - 5  . POSTERIOR CERVICAL FUSION/FORAMINOTOMY N/A 05/17/2017   Procedure: Cervical five  to Thorastic one  Posterior cervical  fusion with lateral mass screws/revision of prior instrumentation;  Surgeon: Erline Levine, MD;  Location: [place];  Service: Neurosurgery;  Laterality: N/A;  C5 to T1 Posterior cervical fusion with lateral mass screws/revision of prior instrumentation  . TENDON REPAIR  JAN 2010   LEFT INDEX AND LONG FINGERS  . TUBAL LIGATION     Past Medical History:  Diagnosis Date  . Acute sinusitis, unspecified   . Anemia    "quite a few times"  . Anxiety state, unspecified   . Arachnoiditis BILATERAL LEGS   DUE TO MULTIPLE BACK SURG.'S  . Arthritis   . Asthma    last  flare up was 03/2017 lasted over a month  . Blood transfusion   . Cancer ([place])    skin cancers (in scalp)  . Cardiomyopathy HX --06/2010   EF was 25% during acute illness (PHELONEPHRITIS) Repeat echo 12-06-10 60% showed normal EF.   Marland Kitchen Chronic back pain greater than 3 months duration    S/P BACK SURG'S  . CSF leak   . Diabetes mellitus without complication ([place])    dx 2013 type 2  . Dyslipidemia   . Dysphagia    some post op cerv fusion 2/14  . Dysrhythmia   . Essential hypertension, benign   . Family history of adverse reaction to anesthesia    mother gets n/v  . GERD (gastroesophageal reflux disease) AND HIATIAL HERNIA   CONTROLLED W/ NEXIUM  . Headache(784.0)   . Heart murmur    DENIES S & S   (ECHO JUN'12 W/ CHART)  . History of chronic bronchitis   . Hx of bladder infections   . Hypertension   . Murmur, heart   . Neuromuscular disorder (HCC)    numbness and tingling  . Osteoporosis   . Other malaise and fatigue   . PONV (postoperative nausea and vomiting)   . Shortness of breath   . Spinal headache   . Spinal stenosis, cervical region   . Varicosities    venous  . Weakness of both legs DUE TO ARACHNOIDITIS   OCCASIONAL USES CANE   BP 123/82   Pulse 81   Ht 5\' 5"  (1.651 m) Comment: pt reported  SpO2 96%   BMI 31.78 kg/m   Opioid Risk Score:   Fall Risk Score:  `1  Depression screen PHQ  2/9  Depression screen [facility] 2/9 03/12/2018 10/25/2017 09/27/2017 07/02/2017 06/04/2017 05/03/2017 03/05/2017  Decreased Interest 0 0 0 1 1 0 0  Down, Depressed, Hopeless 0 0 0 1 1 0 0  PHQ - 2 Score 0 0 0 2 2 0 0  Altered sleeping - - 0 - - - -  Tired, decreased energy - - 0 - - - -  Change in appetite - - 0 - - - -  Feeling bad or failure about yourself  - - 0 - - - -  Trouble concentrating - - 0 - - - -  Moving slowly or fidgety/restless - - 0 - - - -  Suicidal thoughts - - 0 - - - -  PHQ-9 Score - - 0 - - - -  Some recent data might be hidden    Review of Systems  Constitutional: Negative.   HENT: Negative.   Eyes: Negative.   Respiratory: Negative.   Cardiovascular: Negative.   Gastrointestinal: Negative.   Endocrine: Negative.   Genitourinary: Negative.   Musculoskeletal: Negative.   Skin: Negative.   Allergic/Immunologic: Negative.   Neurological: Negative.   Hematological: Negative.   Psychiatric/Behavioral: Negative.   All other systems reviewed and are negative.      Objective:   Physical Exam  Constitutional: She is oriented to person, place, and time. She appears well-developed and well-nourished.  HENT:  Head: Normocephalic and atraumatic.  Neck: Normal range of motion. Neck supple.  Cervical Paraspinal Tenderness: C-5-C-6  Cardiovascular: Normal rate and regular rhythm.  Pulmonary/Chest: Effort normal and breath sounds normal.  Musculoskeletal:  Normal Muscle Bulk and Muscle Testing Reveals: Upper Extremities: Full ROM and Muscle Strength 5/5 Bilateral AC Joint Tenderness Thoracic and Lumbar Hypersensitivity Lower Extremities: Decreased ROM and Muscle Strength 4/5 Bilateral Lower  Extremities Flexion Produces Pain into Lower Extremities and Lumbar Arises from Table Slowly using cane for support Antalgic Gait   Neurological: She is alert and oriented to person, place, and time.  Skin: Skin is warm and dry.  Psychiatric: She has a normal mood and affect. Her  behavior is normal.  Nursing note and vitals reviewed.         Assessment & Plan:  1. On 05/17/2017 :C5C6C7 T1 Posterior Cervical Fusion with lateral mass fixation with AIRO Imaging, revision of prior instrumentation. By Dr. Vertell Limber.  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 Continuecurrent medication regimen withGabapentin800 mg QID.03/12/2018. Refilled:Oxycodone 15mg  one tablet every 6 hours as needed # 120 and  Oxymorphone HCL 40 mg every 12 hours #60. We will continue the opioid monitoring program, this consists of regular clinic visits, examinations, urine drug screen, pill counts as well as use of [place] Controlled Substance Reporting System. 2. 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 02/14/2018 3. Anxiety/depression: Continue Valium, PCP Following. Continue Celexa. Continueto monitor. 03/12/2018 4. Muscle Spasms: Continuecurrent medication regime withFlexeril. Continue to Monitor. 03/12/2018 5. Cervicalgia/ Cervical Radiculitis: Continuecurrent medication regime withGabapentin: Dr. Vertell Limber Following: S/P C5C6C7 T1 Posterior Cervical Fusion with lateral mass fixation with AIRO Imaging, revision of prior instrumentation by Dr. Vertell Limber on 05/17/2017. 03/12/2018 6. Frequent Falls:Educated on  Falls Prevention: also instructed to use cane or walker at all times, she verbalizes understanding.  7. Pitting Edema: PCP Following. 03/12/2018 8. Bilateral Thoracic Back Pain: Continue HEP as Tolerated and Continue current medication regimen. Continue to Monitor.   30 minutes of face to face patient care time was spent during this visit. All questions were encouraged and answered.  F/U in 1 month

## 2018-03-13 ENCOUNTER — Ambulatory Visit
Admission: RE | Admit: 2018-03-13 | Discharge: 2018-03-13 | Disposition: A | Discharge status: Home / Self Care | Payer: Medicare Other | Source: Ambulatory Visit | Attending: Neurosurgery | Admitting: Neurosurgery

## 2018-03-13 DIAGNOSIS — M5126 Other intervertebral disc displacement, lumbar region: Secondary | ICD-10-CM | POA: Diagnosis not present

## 2018-03-13 DIAGNOSIS — M545 Low back pain: Principal | ICD-10-CM

## 2018-03-13 DIAGNOSIS — G8929 Other chronic pain: Secondary | ICD-10-CM

## 2018-03-13 DIAGNOSIS — M5134 Other intervertebral disc degeneration, thoracic region: Secondary | ICD-10-CM | POA: Diagnosis not present

## 2018-03-13 MED ORDER — ONDANSETRON HCL 4 MG/2ML IJ SOLN
4.0000 mg | Freq: Once | INTRAMUSCULAR | Status: AC
  Administered 2018-03-13: 4 mg via INTRAMUSCULAR

## 2018-03-13 MED ORDER — MEPERIDINE HCL 100 MG/ML IJ SOLN
100.0000 mg | Freq: Once | INTRAMUSCULAR | Status: AC
  Administered 2018-03-13: 100 mg via INTRAMUSCULAR

## 2018-03-13 MED ORDER — IOPAMIDOL (ISOVUE-M 300) INJECTION 61%
10.0000 mL | Freq: Once | INTRAMUSCULAR | Status: AC | PRN
  Administered 2018-03-13: 10 mL via INTRATHECAL

## 2018-03-13 NOTE — Discharge Instructions (Signed)

## 2018-03-14 ENCOUNTER — Telehealth: Payer: Self-pay

## 2018-03-14 NOTE — Telephone Encounter (Signed)
Pt called stating that the drug store only has #40 in stock of Oxymorphone. She states the company that made Oxymorphone was bought out by another [place] is not making it right now. I called [employer] and spoke to [place] the pharmacist and its true. He states he is not sure if the company will make anymore right now, its not telling them. He states they may take it off the market since they did the brand name Opana. Pt states she is running out of medication tomorrow and needs something sent in. She states she does not want the Fentanyl and if put her back Dilaudid she states the 12 mg didn't work.

## 2018-03-14 NOTE — Telephone Encounter (Signed)
Return [name] [name] call, she stated her pharmacist only has 40 tablets of the Oxymorphone. This Provider [facility] [place], they don't have any Oxymorphone. [place] doesn't have any Oxymorphone and [facility] doesn't have. [name] [name] was also going to call a few neighborhood pharmacy and call this provider. Awaiting her return call.

## 2018-03-15 ENCOUNTER — Telehealth: Payer: Self-pay | Admitting: Physical Medicine & Rehabilitation

## 2018-03-15 ENCOUNTER — Telehealth: Payer: Self-pay | Admitting: Registered Nurse

## 2018-03-15 DIAGNOSIS — M961 Postlaminectomy syndrome, not elsewhere classified: Secondary | ICD-10-CM

## 2018-03-15 MED ORDER — OXYMORPHONE HCL ER 40 MG PO T12A: 40.0000 mg | EXTENDED_RELEASE_TABLET | Freq: Two times a day (BID) | ORAL | 0 refills | Status: DC

## 2018-03-15 NOTE — Telephone Encounter (Signed)
Received a call from Ms. Reimann , she reports [facility] [address] [facility]. This provider [facility], they have the [facility]orphone, she also stated the Oxymorphone most likely will be pulled from the market. This provider spoke with Ms. Shokan regarding the above she verbalizes understanding. Reiterated she needs to be seen 5-7 days prior to prescription being filled, she verbalizes understanding. October appointment changed to 04/09/2018 at 1:00 she verbalizes understanding. [facility] notified to pull the Oxymorphone.

## 2018-03-18 ENCOUNTER — Ambulatory Visit: Payer: Medicare Other | Admitting: Internal Medicine

## 2018-03-18 NOTE — Progress Notes (Deleted)
OV 04/18/2016  Chief Complaint  Patient presents with  . Follow-up    Pt states voice is raspy. Pt c/o of some congestion more in morning, no tightness, and SOB w/ and w/o excertions. Pt states cough comes and goes.     FU asthma - MCT positive - on symbicort FU 72mm lung nodules - passive smoking Chronic voice hoarseness nos  Seeing afer 1 year. In interim has seen others. In [place] at beach s/p syncope and near cardiac arrest due to dehydratiin per her hx. Since then not the same. Last week having right infrascapular pain that gets worse when she lifts her hand. Also last week or so increased hoarseness of voice is increased cough and wheezing production.She is demanding to have arepeat CT scan of chest for her 83mm nodules.She is extremely worried about cancer.   OV 02/27/2017  Chief Complaint  Patient presents with  . Follow-up    6 month f/u, moderate asthma, been doing ok but started wheezing at night when she lays down, allergies causing congestion    Follow-up asthma based on methacholine challenge test positivity and chronic hoarseness of voice  Last seen October 2017. She says for the last 7-8 months she's been having nocturnal wheezing that is waking up at night. But in the daytime she does not have any wheezing. She is also also having dysphagia that is chronic. She was referred to ENT particularly in the context of having to have C-spine surgery. Apparently her  Vocal cords were inflamed and he was then asked to take double dose of Protonix which seemed to help the cough but not fully. This is frustrating her. She struggling with understanding the pathophysiology of this. She's taking Symbicort and singulair regularly. She has lost 15 pounds of weight in the last few weeks intentionally according to her and so she does not think acid reflux is a problem.    asthma control questionnaire shows that at night she is waking up a few times because of perceived asthma symptoms.  When she wakes up she is asymptomatic and she is only very slightly limited in her activities by asthma. She is only experiencing very little shortness of breath because of asthma. She is wheezing only a little of the time at night but is using a lot of albuterol for rescue. 5 point average score is 1  feno is 25 ppb and normal 02/27/2017  She is asking for =- new issue of pre op clearance prior to C spine surgery next month   OV 08/30/2017   Chief Complaint  Patient presents with  . Follow-up    Pt states she was in the hospital February 13 in [place] due to pna.  Pt does have current complaints of cough, fatigue, hoarseness, and chest tightness. States she is unsure if she is fully over the pna.      Follow-up asthma based on methacholine challenge test positivity and chronic hoarseness of voice  67 year old female follow-up asthma based on methacholine challenge test and chronic hoarseness of voice.  Last seen fall 2018.  After that she tells me that in February 2019 she got hospitalized after suddenly taking ill.  She shows a blood gas results from the outside showing hypoxemia without hypercapnia.  She was not intubated or treated with BiPAP.  She was treated in the medical floor with oxygen and antibiotics.  She says she had hospital-acquired delirium and was diagnosed with right upper lobe pneumonia.  She was then  discharged after a few days.  Since then she is improving slowly but having significant fatigue review of the lab results from [facility] also shows anemia but otherwise chemistries were normal.  I do not have a chest x-ray for my visualization.  Now she tells me that despite being on inhalers for the last 2 days she is having worsening cough that is dry with white mucus no wheezing no orthopnea no proximal nocturnal dyspnea no fever.  She is worried about the cough.     Walking desaturation test on 08/30/2017 185 feet x 3 laps on ROOM AIR:  did not desaturate. Waklked at slow  moderate pace. Rest pulse ox was 99%, final pulse ox was 94%. HR response 96/min at rest to 101/min at peak exertion. Patient [name] [name]  Did not Desaturate < 88% . [name] [name] yes did  Desaturated </= 3% points. [name] [name] yes did get tachyardic. Got fatigued but only mildly   Results for [name], [name] (MRN [id number]) as of 02/27/2017 15:30  Ref. Range 12/24/2014 16:53 08/30/2017   Nitric Oxide Unknown 27 Could not perform    OV 03/18/2018  Subjective:  Patient ID: [name] [name], female , DOB: Dec 13, 1950 , age 4 y.o. , MRN: [id number] , ADDRESS: [place] [address]   03/18/2018 -  No chief complaint on file.    HPI [name] [name] 67 y.o. -     ROS - per HPI     has a past medical history of Acute sinusitis, unspecified, Anemia, Anxiety state, unspecified, Arachnoiditis (BILATERAL LEGS), Arthritis, Asthma, Blood transfusion, Cancer ([place]), Cardiomyopathy (HX --06/2010), Chronic back pain greater than 3 months duration, CSF leak, Diabetes mellitus without complication ([place]), Dyslipidemia, Dysphagia, Dysrhythmia, Essential hypertension, benign, Family history of adverse reaction to anesthesia, GERD (gastroesophageal reflux disease) (AND HIATIAL HERNIA), Headache(784.0), Heart murmur, History of chronic bronchitis, bladder infections, Hypertension, Murmur, heart, Neuromuscular disorder ([place]), Osteoporosis, Other malaise and fatigue, PONV (postoperative nausea and vomiting), Shortness of breath, Spinal headache, Spinal stenosis, cervical region, Varicosities, and Weakness of both legs (DUE TO ARACHNOIDITIS).   reports that she has never smoked. She has never used smokeless tobacco.  Past Surgical History:  Procedure Laterality Date  . ABDOMINAL HYSTERECTOMY  1987   W/ BSO  . ANTERIOR CERVICAL DECOMP/DISCECTOMY FUSION N/A 08/09/2012   Procedure: ANTERIOR CERVICAL DECOMPRESSION/DISCECTOMY FUSION 1 LEVEL;  Surgeon: Floyce Stakes, MD;  Location: [facility];   Service: Neurosurgery;  Laterality: N/A;  Cervical four-five  Anterior cervical decompression/diskectomy/fusion  . ANTERIOR FUSION CERVICAL SPINE  2007   C5 -6  . APPENDECTOMY    . APPLICATION OF INTRAOPERATIVE CT SCAN N/A 05/17/2017   Procedure: APPLICATION OF INTRAOPERATIVE CT SCAN;  Surgeon: Erline Levine, MD;  Location: [facility];  Service: Neurosurgery;  Laterality: N/A;  . BACK SURGERY     x 5  . BREAST EXCISIONAL BIOPSY Bilateral    No scar seen   . BREAST SURGERY     x 2 biopsies  . CARPAL TUNNEL RELEASE  RIGHT - 2001  & DEC 2011 W/ BACK SURG.  . CERVICAL DISC SURGERY  2005   C5 - 6  . CHOLECYSTECTOMY  1994  . DORSAL COMPARTMENT RELEASE Right 04/07/2013   Procedure: RIGHT WRIST STS RELEASE;  Surgeon: Schuyler Amor, MD;  Location: [facility];  Service: Orthopedics;  Laterality: Right;  . EXPLORATION OF INCISION FOR CSF LEAK  DEC 2011  X2   POST LAMINECOTMY  .  FINGER ARTHRODESIS Right 04/07/2013   Procedure: RIGHT INDEX AND RIGHT LONG DISTAL INTERPHALANGEAL JOINT FUSIONS;  Surgeon: Schuyler Amor, MD;  Location: [facility];  Service: Orthopedics;  Laterality: Right;  . FINGER ARTHROPLASTY Right 04/07/2013   Procedure: RIGHT THUMB [place] ARTHROPLASTY;  Surgeon: Schuyler Amor, MD;  Location: [facility];  Service: Orthopedics;  Laterality: Right;  . GANGLION CYST EXCISION Right 04/07/2013   Procedure: RIGHT WRIST MASS EXCISION;  Surgeon: Schuyler Amor, MD;  Location: [facility];  Service: Orthopedics;  Laterality: Right;  . KNEE ARTHROSCOPY  LEFT X3 (LAST ONE 2005)  . KNEE ARTHROSCOPY  05/17/2011   Procedure: ARTHROSCOPY KNEE;  Surgeon: Bradley Ferris III;  Location: [facility];  Service: Orthopedics;  Laterality: Right;  WITH MEDIAL MENISECTOMY AND removal of suprapatella fat lump  . LEFT WRIST TENOSYNECTOMY W/ LEFT THUMB JOINT REPAIR  02-24-10  . LUMBAR FUSION  11-30-10   L4 - 5  . LUMBAR  FUSION  2000   L2 - 4  . LUMBAR LAMINECTOMY  DEC 2011   L4 - 5  . POSTERIOR CERVICAL FUSION/FORAMINOTOMY N/A 05/17/2017   Procedure: Cervical five  to Thorastic one  Posterior cervical fusion with lateral mass screws/revision of prior instrumentation;  Surgeon: Erline Levine, MD;  Location: [facility];  Service: Neurosurgery;  Laterality: N/A;  C5 to T1 Posterior cervical fusion with lateral mass screws/revision of prior instrumentation  . TENDON REPAIR  JAN 2010   LEFT INDEX AND LONG FINGERS  . TUBAL LIGATION      Allergies  Allergen Reactions  . Latex Itching and Rash  . Morphine And Related Hives and Itching  . Aspirin Nausea And Vomiting    Can take coated asa  . Biaxin [Clarithromycin] Nausea And Vomiting  . Clarithromycin Diarrhea  . Codeine Itching  . Darvocet [Propoxyphene N-Acetaminophen] Itching  . Hydrocodone-Acetaminophen Itching  . Lortab [Hydrocodone-Acetaminophen] Itching  . Percocet [Oxycodone-Acetaminophen] Itching  . Tramadol Nausea Only    Immunization History  Administered Date(s) Administered  . Influenza Split 04/19/2013, 03/18/2014  . Influenza,inj,Quad PF,6+ Mos 05/04/2015, 02/27/2017  . Pneumococcal Polysaccharide-23 04/19/2013  . Zoster 05/30/2013    Family History  Problem Relation Age of Onset  . Hypertension Mother   . Heart disease Mother   . Diabetes Mother   . Cancer Father   . Diabetes Brother      Current Outpatient Medications:  .  alendronate (FOSAMAX) 70 MG tablet, Take 70 mg by mouth every Wednesday. Take with a full glass of water on an empty stomach. , Disp: , Rfl:  .  aspirin EC 81 MG tablet, Take 81 mg by mouth at bedtime. , Disp: , Rfl:  .  budesonide-formoterol (SYMBICORT) 80-4.5 MCG/ACT inhaler, Inhale 2 puffs into the lungs 2 (two) times daily., Disp: 1 Inhaler, Rfl: 6 .  Calcium Carbonate-Vit D-Min 1200-1000 MG-UNIT CHEW, Chew 1 tablet by mouth daily. , Disp: , Rfl:  .  citalopram (CELEXA) 20 MG tablet, Take 1 tablet (20 mg  total) by mouth at bedtime., Disp: 30 tablet, Rfl: 2 .  cyclobenzaprine (FLEXERIL) 10 MG tablet, Take 10 mg by mouth See admin instructions. Take 10mg  by mouth twice daily every other week alternating with valium, Disp: , Rfl:  .  diazepam (VALIUM) 10 MG tablet, Take 10 mg by mouth See admin instructions. Take 10mg  by mouth once daily as needed every other week alternating with cyclobenzaprine 10mg , Disp: , Rfl:  .  diltiazem (  CARDIZEM CD) 240 MG 24 hr capsule, Take 240 mg by mouth daily., Disp: , Rfl:  .  doxycycline (VIBRA-TABS) 100 MG tablet, Take 1 tablet (100 mg total) by mouth 2 (two) times daily., Disp: 10 tablet, Rfl: 0 .  esomeprazole (NEXIUM) 40 MG capsule, Take 40 mg by mouth every morning. , Disp: , Rfl:  .  estradiol (ESTRACE) 2 MG tablet, Take 2 mg by mouth daily. , Disp: , Rfl:  .  gabapentin (NEURONTIN) 800 MG tablet, Take 1 tablet (800 mg total) by mouth 4 (four) times daily., Disp: 120 tablet, Rfl: 5 .  JINTELI 1-5 MG-MCG TABS tablet, Take 1 tablet by mouth daily. , Disp: , Rfl:  .  levalbuterol (XOPENEX HFA) 45 MCG/ACT inhaler, Inhale 1-2 puffs into the lungs every 4 (four) hours as needed. For shortness of breath, Disp: 1 Inhaler, Rfl: 6 .  losartan-hydrochlorothiazide (HYZAAR) 50-12.5 MG tablet, Take 1 tablet by mouth daily., Disp: , Rfl: 0 .  metFORMIN (GLUCOPHAGE) 500 MG tablet, Take 500 mg by mouth 2 (two) times daily with a meal. , Disp: , Rfl:  .  montelukast (SINGULAIR) 10 MG tablet, Take 10 mg by mouth at bedtime., Disp: , Rfl:  .  naloxone (NARCAN) nasal spray 4 mg/0.1 mL, Place 1 spray into the nose once as needed (Just in case of Opiod Overdose)., Disp: , Rfl:  .  Omega-3 Fatty Acids (FISH OIL) 1000 MG CAPS, Take 1 capsule by mouth 3 (three) times daily., Disp: , Rfl:  .  oxyCODONE (ROXICODONE) 15 MG immediate release tablet, Take 1 tablet (15 mg total) by mouth every 6 (six) hours as needed. For pain, Disp: 120 tablet, Rfl: 0 .  Oxymorphone HCl, Crush Resist, 40 MG PO  T12A, Take 40 mg by mouth 2 (two) times daily., Disp: 60 tablet, Rfl: 0 .  simvastatin (ZOCOR) 10 MG tablet, Take 10 mg by mouth daily at 6 PM. , Disp: , Rfl:  .  valACYclovir (VALTREX) 500 MG tablet, Take 500 mg by mouth daily. , Disp: , Rfl:  .  vitamin B-12 (CYANOCOBALAMIN) 1000 MCG tablet, Take 1,000 mcg by mouth daily. , Disp: , Rfl:  No current facility-administered medications for this visit.   Facility-Administered Medications Ordered in Other Visits:  .  levalbuterol (XOPENEX) nebulizer solution 0.63 mg, 0.63 mg, Nebulization, Once, Parrett, Tammy S, NP      Objective:   There were no vitals filed for this visit.  Estimated body mass index is 31.78 kg/m as calculated from the following:   Height as of 03/12/18: 5\' 5"  (1.651 m).   Weight as of 02/14/18: 191 lb (86.6 kg).  @WEIGHTCHANGE @  There were no vitals filed for this visit.   Physical Exam  General Appearance:    Alert, cooperative, no distress, appears stated age - *** , Deconditioned looking - *** , OBESE  - ***, Sitting on Wheelchair -  ***  Head:    Normocephalic, without obvious abnormality, atraumatic  Eyes:    PERRL, conjunctiva/corneas clear,  Ears:    Normal TM's and external ear canals, both ears  Nose:   Nares normal, septum midline, mucosa normal, no drainage    or sinus tenderness. OXYGEN ON  - *** . Patient is @ ***   Throat:   Lips, mucosa, and tongue normal; teeth and gums normal. Cyanosis on lips - ***  Neck:   Supple, symmetrical, trachea midline, no adenopathy;    thyroid:  no enlargement/tenderness/nodules; no carotid   bruit or  JVD  Back:     Symmetric, no curvature, ROM normal, no CVA tenderness  Lungs:     Distress - *** , Wheeze ***, Barrell Chest - ***, Purse lip breathing - ***, Crackles - ***   Chest Wall:    No tenderness or deformity.    Heart:    Regular rate and rhythm, S1 and S2 normal, no rub   or gallop, Murmur - ***  Breast Exam:    NOT DONE  Abdomen:     Soft, non-tender, bowel  sounds active all four quadrants,    no masses, no organomegaly. Visceral obesity - ***  Genitalia:   NOT DONE  Rectal:   NOT DONE  Extremities:   Extremities - normal, Has Cane - ***, Clubbing - ***, Edema - ***  Pulses:   2+ and symmetric all extremities  Skin:   Stigmata of Connective Tissue Disease - ***  Lymph nodes:   Cervical, supraclavicular, and axillary nodes normal  Psychiatric:  Neurologic:   Pleasant - ***, Anxious - ***, Flat affect - ***  CAm-ICU - neg, Alert and Oriented x 3 - yes, Moves all 4s - yes, Speech - normal, Cognition - intact           Assessment:     No diagnosis found.     Plan:     There are no Patient Instructions on file for this visit.    SIGNATURE    Dr. Brand Males, M.D., F.C.C.P,  Pulmonary and Critical Care Medicine Staff Physician, [place] Director - Interstitial Lung Disease  Program  Pulmonary [place] [facility] [place], [place], [address]  Pager: [phone], If no answer or between  15:00h - 7:00h: call [phone] Telephone: [phone]  12:23 AM 03/18/2018

## 2018-03-27 DIAGNOSIS — I10 Essential (primary) hypertension: Secondary | ICD-10-CM | POA: Diagnosis not present

## 2018-03-27 DIAGNOSIS — G039 Meningitis, unspecified: Secondary | ICD-10-CM | POA: Diagnosis not present

## 2018-03-27 DIAGNOSIS — M5416 Radiculopathy, lumbar region: Secondary | ICD-10-CM | POA: Diagnosis not present

## 2018-03-27 DIAGNOSIS — M5412 Radiculopathy, cervical region: Secondary | ICD-10-CM | POA: Diagnosis not present

## 2018-03-27 DIAGNOSIS — M545 Low back pain: Secondary | ICD-10-CM | POA: Diagnosis not present

## 2018-03-27 DIAGNOSIS — Z6830 Body mass index (BMI) 30.0-30.9, adult: Secondary | ICD-10-CM | POA: Diagnosis not present

## 2018-03-28 ENCOUNTER — Encounter: Payer: Self-pay | Admitting: Internal Medicine

## 2018-03-28 ENCOUNTER — Ambulatory Visit (INDEPENDENT_AMBULATORY_CARE_PROVIDER_SITE_OTHER): Payer: Medicare Other | Admitting: Internal Medicine

## 2018-03-28 VITALS — BP 102/60 | HR 81 | Ht 64.75 in

## 2018-03-28 DIAGNOSIS — R05 Cough: Secondary | ICD-10-CM

## 2018-03-28 DIAGNOSIS — R0602 Shortness of breath: Secondary | ICD-10-CM | POA: Diagnosis not present

## 2018-03-28 DIAGNOSIS — J4540 Moderate persistent asthma, uncomplicated: Secondary | ICD-10-CM | POA: Diagnosis not present

## 2018-03-28 DIAGNOSIS — J387 Other diseases of larynx: Secondary | ICD-10-CM

## 2018-03-28 DIAGNOSIS — R059 Cough, unspecified: Secondary | ICD-10-CM

## 2018-03-28 DIAGNOSIS — I83892 Varicose veins of left lower extremities with other complications: Secondary | ICD-10-CM

## 2018-03-28 DIAGNOSIS — Z23 Encounter for immunization: Secondary | ICD-10-CM | POA: Diagnosis not present

## 2018-03-28 LAB — NITRIC OXIDE: Nitric Oxide: 13

## 2018-03-28 NOTE — Telephone Encounter (Signed)
[name] called and says that the [facility] will be getting the oxymorphone and she wants to remind [name] [name] about talking to Dr Naaman Plummer about anxiety medication she can take during the day.

## 2018-03-28 NOTE — Addendum Note (Signed)
Addended by: Madolyn Frieze on: 03/28/2018 04:43 PM   Modules accepted: Orders

## 2018-03-28 NOTE — Addendum Note (Signed)
Addended by: Madolyn Frieze on: 03/28/2018 04:28 PM   Modules accepted: Orders

## 2018-03-28 NOTE — Patient Instructions (Addendum)
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

## 2018-03-28 NOTE — Telephone Encounter (Signed)
Placed a call to [name], I will discuss with Dr. Naaman Plummer regarding the above, she verbalizes understanding. Also wanted to ask about Celexa,  Today we will change Celexa to the morning. She was instructed to call office on Monday to evaluate Celexa. She verbalizes understanding.

## 2018-03-28 NOTE — Progress Notes (Addendum)
OV 04/18/2016  Chief Complaint  Patient presents with  . Follow-up    Pt states voice is raspy. Pt c/o of some congestion more in morning, no tightness, and SOB w/ and w/o excertions. Pt states cough comes and goes.     FU asthma - MCT positive - on symbicort FU 33mm lung nodules - passive smoking Chronic voice hoarseness nos  Seeing afer 1 year. In interim has seen others. In summer at beach s/p syncope and near cardiac arrest due to dehydratiin per her hx. Since then not the same. Last week having right infrascapular pain that gets worse when she lifts her hand. Also last week or so increased hoarseness of voice is increased cough and wheezing production.She is demanding to have arepeat CT scan of chest for her 50mm nodules.She is extremely worried about cancer.   OV 02/27/2017  Chief Complaint  Patient presents with  . Follow-up    6 month f/u, moderate asthma, been doing ok but started wheezing at night when she lays down, allergies causing congestion    Follow-up asthma based on methacholine challenge test positivity and chronic hoarseness of voice  Last seen October 2017. She says for the last 7-8 months she's been having nocturnal wheezing that is waking up at night. But in the daytime she does not have any wheezing. She is also also having dysphagia that is chronic. She was referred to ENT particularly in the context of having to have C-spine surgery. Apparently her  Vocal cords were inflamed and he was then asked to take double dose of Protonix which seemed to help the cough but not fully. This is frustrating her. She struggling with understanding the pathophysiology of this. She's taking Symbicort and singulair regularly. She has lost 15 pounds of weight in the last few weeks intentionally according to her and so she does not think acid reflux is a problem.    asthma control questionnaire shows that at night she is waking up a few times because of perceived asthma  symptoms. When she wakes up she is asymptomatic and she is only very slightly limited in her activities by asthma. She is only experiencing very little shortness of breath because of asthma. She is wheezing only a little of the time at night but is using a lot of albuterol for rescue. 5 point average score is 1  feno is 25 ppb and normal 02/27/2017  She is asking for =- new issue of pre op clearance prior to C spine surgery next month   OV 08/30/2017   Chief Complaint  Patient presents with  . Follow-up    Pt states she was in the hospital February 13 in [place] due to pna.  Pt does have current complaints of cough, fatigue, hoarseness, and chest tightness. States she is unsure if she is fully over the pna.      Follow-up asthma based on methacholine challenge test positivity and chronic hoarseness of voice  67 year old female follow-up asthma based on methacholine challenge test and chronic hoarseness of voice.  Last seen fall 2018.  After that she tells me that in February 2019 she got hospitalized after suddenly taking ill.  She shows a blood gas results from the outside showing hypoxemia without hypercapnia.  She was not intubated or treated with BiPAP.  She was treated in the medical floor with oxygen and antibiotics.  She says she had hospital-acquired delirium and was diagnosed with right upper lobe pneumonia.  She  was then discharged after a few days.  Since then she is improving slowly but having significant fatigue review of the lab results from [facility] also shows anemia but otherwise chemistries were normal.  I do not have a chest x-ray for my visualization.  Now she tells me that despite being on inhalers for the last 2 days she is having worsening cough that is dry with white mucus no wheezing no orthopnea no proximal nocturnal dyspnea no fever.  She is worried about the cough.     Walking desaturation test on 08/30/2017 185 feet x 3 laps on ROOM AIR:  did not desaturate.  Waklked at slow moderate pace. Rest pulse ox was 99%, final pulse ox was 94%. HR response 96/min at rest to 101/min at peak exertion. Patient SWAYZEE WADLEY  Did not Desaturate < 88% . Troy Hartzog Simer yes did  Desaturated </= 3% points. AARILYN DYE yes did get tachyardic. Got fatigued but only mildly     OV 03/28/2018  Subjective:  Patient ID: [name] [name], female , DOB: 1951/06/19 , age 67 y.o. , MRN: [id number] , ADDRESS: [place] [id number]   03/28/2018 -   Chief Complaint  Patient presents with  . Follow-up    still not feeling well- retaining fluid in leg - refused weight    Follow-up asthma based on methacholine challenge test positivity and chronic hoarseness of voice  HPI DEBORRA PHEGLEY 67 y.o. -presents for routine follow-up.  She tells me that through the summer 2019 she has had 2 course of antibiotics and through a full course of prednisone for respiratory exacerbation.  She says she has chronic shortness of breath with exertion relieved by rest without associated chest pain.  She feels that the course is been progressive.  Moderate in intensity occasionally severe.  In addition she has nocturnal wheezing that is waking her up.  This despite using Xopenex and Entocort which seemed to help her.  In addition for the last 8 months or so she is reporting left lower extremity edema that is worse than the right side.  She has tried compression stockings without relief.  This is new report to me.  She says primary care is not referring her to cardiology.  She thinks that overall she is volume overloaded because of the symptom.  She had a stress test several years ago that was normal.  In addition is also reporting chronic hoarseness of voice that is worse.  Last visit with ENT was with Dr. Redmond Baseman approximately 1 year ago.  She still has not followed up with him.  Exam nitric oxide today is 13 ppb and normal asthma control questionnaire with significant amount of  symptomatology.    Results for ABYGAYLE, DELTORO (MRN [id number]) as of 02/27/2017 15:30  Ref. Range 12/24/2014 16:53 08/30/2017   Nitric Oxide Unknown 27 Could not perform     ROS - per HPI     has a past medical history of Acute sinusitis, unspecified, Anemia, Anxiety state, unspecified, Arachnoiditis (BILATERAL LEGS), Arthritis, Asthma, Blood transfusion, Cancer ([place]), Cardiomyopathy (HX --06/2010), Chronic back pain greater than 3 months duration, CSF leak, Diabetes mellitus without complication ([place]), Dyslipidemia, Dysphagia, Dysrhythmia, Essential hypertension, benign, Family history of adverse reaction to anesthesia, GERD (gastroesophageal reflux disease) (AND HIATIAL HERNIA), Headache(784.0), Heart murmur, History of chronic bronchitis, bladder infections, Hypertension, Murmur, heart, Neuromuscular disorder ([place]), Osteoporosis, Other malaise and fatigue, PONV (postoperative nausea and vomiting), Shortness of breath, Spinal headache, Spinal  stenosis, cervical region, Varicosities, and Weakness of both legs (DUE TO ARACHNOIDITIS).   reports that she has never smoked. She has never used smokeless tobacco.  Past Surgical History:  Procedure Laterality Date  . ABDOMINAL HYSTERECTOMY  1987   W/ BSO  . ANTERIOR CERVICAL DECOMP/DISCECTOMY FUSION N/A 08/09/2012   Procedure: ANTERIOR CERVICAL DECOMPRESSION/DISCECTOMY FUSION 1 LEVEL;  Surgeon: Floyce Stakes, MD;  Location: [facility];  Service: Neurosurgery;  Laterality: N/A;  Cervical four-five  Anterior cervical decompression/diskectomy/fusion  . ANTERIOR FUSION CERVICAL SPINE  2007   C5 -6  . APPENDECTOMY    . APPLICATION OF INTRAOPERATIVE CT SCAN N/A 05/17/2017   Procedure: APPLICATION OF INTRAOPERATIVE CT SCAN;  Surgeon: Erline Levine, MD;  Location: [facility];  Service: Neurosurgery;  Laterality: N/A;  . BACK SURGERY     x 5  . BREAST EXCISIONAL BIOPSY Bilateral    No scar seen   . BREAST SURGERY     x 2 biopsies  . CARPAL TUNNEL  RELEASE  RIGHT - 2001  & DEC 2011 W/ BACK SURG.  . CERVICAL DISC SURGERY  2005   C5 - 6  . CHOLECYSTECTOMY  1994  . DORSAL COMPARTMENT RELEASE Right 04/07/2013   Procedure: RIGHT WRIST STS RELEASE;  Surgeon: Schuyler Amor, MD;  Location: [facility];  Service: Orthopedics;  Laterality: Right;  . EXPLORATION OF INCISION FOR CSF LEAK  DEC 2011  X2   POST LAMINECOTMY  . FINGER ARTHRODESIS Right 04/07/2013   Procedure: RIGHT INDEX AND RIGHT LONG DISTAL INTERPHALANGEAL JOINT FUSIONS;  Surgeon: Schuyler Amor, MD;  Location: [facility];  Service: Orthopedics;  Laterality: Right;  . FINGER ARTHROPLASTY Right 04/07/2013   Procedure: RIGHT THUMB [place] ARTHROPLASTY;  Surgeon: Schuyler Amor, MD;  Location: [facility];  Service: Orthopedics;  Laterality: Right;  . GANGLION CYST EXCISION Right 04/07/2013   Procedure: RIGHT WRIST MASS EXCISION;  Surgeon: Schuyler Amor, MD;  Location: [facility];  Service: Orthopedics;  Laterality: Right;  . KNEE ARTHROSCOPY  LEFT X3 (LAST ONE 2005)  . KNEE ARTHROSCOPY  05/17/2011   Procedure: ARTHROSCOPY KNEE;  Surgeon: Bradley Ferris III;  Location: [facility];  Service: Orthopedics;  Laterality: Right;  WITH MEDIAL MENISECTOMY AND removal of suprapatella fat lump  . LEFT WRIST TENOSYNECTOMY W/ LEFT THUMB JOINT REPAIR  02-24-10  . LUMBAR FUSION  11-30-10   L4 - 5  . LUMBAR FUSION  2000   L2 - 4  . LUMBAR LAMINECTOMY  DEC 2011   L4 - 5  . POSTERIOR CERVICAL FUSION/FORAMINOTOMY N/A 05/17/2017   Procedure: Cervical five  to Thorastic one  Posterior cervical fusion with lateral mass screws/revision of prior instrumentation;  Surgeon: Erline Levine, MD;  Location: [facility];  Service: Neurosurgery;  Laterality: N/A;  C5 to T1 Posterior cervical fusion with lateral mass screws/revision of prior instrumentation  . TENDON REPAIR  JAN 2010   LEFT INDEX AND LONG FINGERS  . TUBAL LIGATION       Allergies  Allergen Reactions  . Latex Itching and Rash  . Morphine And Related Hives and Itching  . Aspirin Nausea And Vomiting    Can take coated asa  . Biaxin [Clarithromycin] Nausea And Vomiting  . Clarithromycin Diarrhea  . Codeine Itching  . Darvocet [Propoxyphene N-Acetaminophen] Itching  . Hydrocodone-Acetaminophen Itching  . Lortab [Hydrocodone-Acetaminophen] Itching  . Percocet [Oxycodone-Acetaminophen] Itching  . Tramadol Nausea Only    Immunization  History  Administered Date(s) Administered  . Influenza Split 04/19/2013, 03/18/2014  . Influenza,inj,Quad PF,6+ Mos 05/04/2015, 02/27/2017  . Pneumococcal Polysaccharide-23 04/19/2013  . Zoster 05/30/2013    Family History  Problem Relation Age of Onset  . Hypertension Mother   . Heart disease Mother   . Diabetes Mother   . Cancer Father   . Diabetes Brother      Current Outpatient Medications:  .  alendronate (FOSAMAX) 70 MG tablet, Take 70 mg by mouth every Wednesday. Take with a full glass of water on an empty stomach. , Disp: , Rfl:  .  aspirin EC 81 MG tablet, Take 81 mg by mouth at bedtime. , Disp: , Rfl:  .  budesonide-formoterol (SYMBICORT) 80-4.5 MCG/ACT inhaler, Inhale 2 puffs into the lungs 2 (two) times daily., Disp: 1 Inhaler, Rfl: 6 .  Calcium Carbonate-Vit D-Min 1200-1000 MG-UNIT CHEW, Chew 1 tablet by mouth daily. , Disp: , Rfl:  .  citalopram (CELEXA) 20 MG tablet, Take 1 tablet (20 mg total) by mouth at bedtime., Disp: 30 tablet, Rfl: 2 .  cyclobenzaprine (FLEXERIL) 10 MG tablet, Take 10 mg by mouth See admin instructions. Take 10mg  by mouth twice daily every other week alternating with valium, Disp: , Rfl:  .  diazepam (VALIUM) 10 MG tablet, Take 10 mg by mouth See admin instructions. Take 10mg  by mouth once daily as needed every other week alternating with cyclobenzaprine 10mg , Disp: , Rfl:  .  diltiazem (CARDIZEM CD) 240 MG 24 hr capsule, Take 240 mg by mouth daily., Disp: , Rfl:  .   esomeprazole (NEXIUM) 40 MG capsule, Take 40 mg by mouth every morning. , Disp: , Rfl:  .  estradiol (ESTRACE) 2 MG tablet, Take 2 mg by mouth daily. , Disp: , Rfl:  .  gabapentin (NEURONTIN) 800 MG tablet, Take 1 tablet (800 mg total) by mouth 4 (four) times daily., Disp: 120 tablet, Rfl: 5 .  JINTELI 1-5 MG-MCG TABS tablet, Take 1 tablet by mouth daily. , Disp: , Rfl:  .  levalbuterol (XOPENEX HFA) 45 MCG/ACT inhaler, Inhale 1-2 puffs into the lungs every 4 (four) hours as needed. For shortness of breath, Disp: 1 Inhaler, Rfl: 6 .  losartan-hydrochlorothiazide (HYZAAR) 50-12.5 MG tablet, Take 1 tablet by mouth daily., Disp: , Rfl: 0 .  metFORMIN (GLUCOPHAGE) 500 MG tablet, Take 500 mg by mouth 2 (two) times daily with a meal. , Disp: , Rfl:  .  montelukast (SINGULAIR) 10 MG tablet, Take 10 mg by mouth at bedtime., Disp: , Rfl:  .  naloxone (NARCAN) nasal spray 4 mg/0.1 mL, Place 1 spray into the nose once as needed (Just in case of Opiod Overdose)., Disp: , Rfl:  .  Omega-3 Fatty Acids (FISH OIL) 1000 MG CAPS, Take 1 capsule by mouth 3 (three) times daily., Disp: , Rfl:  .  oxyCODONE (ROXICODONE) 15 MG immediate release tablet, Take 1 tablet (15 mg total) by mouth every 6 (six) hours as needed. For pain, Disp: 120 tablet, Rfl: 0 .  Oxymorphone HCl, Crush Resist, 40 MG PO T12A, Take 40 mg by mouth 2 (two) times daily., Disp: 60 tablet, Rfl: 0 .  simvastatin (ZOCOR) 10 MG tablet, Take 10 mg by mouth daily at 6 PM. , Disp: , Rfl:  .  valACYclovir (VALTREX) 500 MG tablet, Take 500 mg by mouth daily. , Disp: , Rfl:  .  vitamin B-12 (CYANOCOBALAMIN) 1000 MCG tablet, Take 1,000 mcg by mouth daily. , Disp: , Rfl:  No current facility-administered medications for this visit.   Facility-Administered Medications Ordered in Other Visits:  .  levalbuterol (XOPENEX) nebulizer solution 0.63 mg, 0.63 mg, Nebulization, Once, Parrett, Tammy S, NP      Objective:   Vitals:   03/28/18 1522  BP: 102/60   Pulse: 81  SpO2: 93%  Height: 5' 4.75" (1.645 m)    Estimated body mass index is 32.03 kg/m as calculated from the following:   Height as of this encounter: 5' 4.75" (1.645 m).   Weight as of 02/14/18: 191 lb (86.6 kg).  @WEIGHTCHANGE @  [employer]     Physical Exam  General Appearance:    Alert, cooperative, no distress, appears stated age - older , Deconditioned looking - mild yes , OBESE  - yes, Sitting on Wheelchair -  no  Head:    Normocephalic, without obvious abnormality, atraumatic  Eyes:    PERRL, conjunctiva/corneas clear,  Ears:    Normal TM's and external ear canals, both ears  Nose:   Nares normal, septum midline, mucosa normal, no drainage    or sinus tenderness. OXYGEN ON  - no . Patient is @ ra   Throat:   Lips, mucosa, and tongue normal; teeth and gums normal. Cyanosis on lips - no  Neck:   Supple, symmetrical, trachea midline, no adenopathy;    thyroid:  no enlargement/tenderness/nodules; no carotid   bruit or JVD  Back:     Symmetric, no curvature, ROM normal, no CVA tenderness  Lungs:     Distress - no , Wheeze no, Barrell Chest - no, Purse lip breathing - no, Crackles - no   Chest Wall:    No tenderness or deformity.    Heart:    Regular rate and rhythm, S1 and S2 normal, no rub   or gallop, Murmur - no  Breast Exam:    NOT DONE  Abdomen:     Soft, non-tender, bowel sounds active all four quadrants,    no masses, no organomegaly. Visceral obesity - yes  Genitalia:   NOT DONE  Rectal:   NOT DONE  Extremities:   Extremities - normal, Has Cane - no, Clubbing - no, Edema - no  Pulses:   2+ and symmetric all extremities  Skin:   Stigmata of Connective Tissue Disease - no  Lymph nodes:   Cervical, supraclavicular, and axillary nodes normal  Psychiatric:  Neurologic:   Pleasant - yes, Anxious - yes, Flat affect - yes  CAm-ICU - neg, Alert and Oriented x 3 - yes, Moves all 4s - yes, Speech - normal, Cognition - intact           Assessment:        ICD-10-CM   1. Shortness of breath R06.02 Nitric oxide  2. Moderate persistent asthma without complication T70.01   3. Irritable larynx J38.7   4. 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 Cough R05 CT Chest High Resolution  6. 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 Brand Males, M.D., F.C.C.P,  Pulmonary and Critical Care Medicine Staff Physician, [place] Director - Interstitial Lung Disease  Program  Pulmonary [place] [facility] [place], [place], [address]  Pager: [phone], If no answer or between  15:00h - 7:00h: call [phone] Telephone: [phone]  4:35 PM 03/28/2018

## 2018-03-29 NOTE — Addendum Note (Signed)
Addended by: Madolyn Frieze on: 03/29/2018 09:16 AM   Modules accepted: Orders

## 2018-04-01 ENCOUNTER — Inpatient Hospital Stay (HOSPITAL_COMMUNITY): Admission: RE | Admit: 2018-04-01 | Payer: Medicare Other | Source: Ambulatory Visit

## 2018-04-01 ENCOUNTER — Other Ambulatory Visit: Payer: Self-pay

## 2018-04-01 DIAGNOSIS — I83812 Varicose veins of left lower extremities with pain: Secondary | ICD-10-CM

## 2018-04-02 DIAGNOSIS — M545 Low back pain: Secondary | ICD-10-CM | POA: Diagnosis not present

## 2018-04-02 DIAGNOSIS — I10 Essential (primary) hypertension: Secondary | ICD-10-CM | POA: Diagnosis not present

## 2018-04-02 DIAGNOSIS — E1141 Type 2 diabetes mellitus with diabetic mononeuropathy: Secondary | ICD-10-CM | POA: Diagnosis not present

## 2018-04-03 NOTE — Telephone Encounter (Signed)
Message from patient

## 2018-04-04 NOTE — Telephone Encounter (Signed)
Message from patient

## 2018-04-05 ENCOUNTER — Encounter (HOSPITAL_COMMUNITY): Payer: Medicare Other

## 2018-04-09 ENCOUNTER — Other Ambulatory Visit: Payer: Medicare Other

## 2018-04-09 ENCOUNTER — Encounter: Payer: Medicare Other | Attending: Physical Medicine & Rehabilitation | Admitting: Registered Nurse

## 2018-04-09 ENCOUNTER — Encounter: Payer: Self-pay | Admitting: Registered Nurse

## 2018-04-09 ENCOUNTER — Telehealth: Payer: Self-pay | Admitting: Registered Nurse

## 2018-04-09 VITALS — BP 113/75 | HR 68 | Ht 64.75 in

## 2018-04-09 DIAGNOSIS — R609 Edema, unspecified: Secondary | ICD-10-CM | POA: Diagnosis not present

## 2018-04-09 DIAGNOSIS — M5416 Radiculopathy, lumbar region: Secondary | ICD-10-CM | POA: Diagnosis not present

## 2018-04-09 DIAGNOSIS — G894 Chronic pain syndrome: Secondary | ICD-10-CM | POA: Diagnosis not present

## 2018-04-09 DIAGNOSIS — M542 Cervicalgia: Secondary | ICD-10-CM

## 2018-04-09 DIAGNOSIS — M961 Postlaminectomy syndrome, not elsewhere classified: Secondary | ICD-10-CM | POA: Diagnosis not present

## 2018-04-09 DIAGNOSIS — G039 Meningitis, unspecified: Secondary | ICD-10-CM | POA: Insufficient documentation

## 2018-04-09 DIAGNOSIS — Z981 Arthrodesis status: Secondary | ICD-10-CM | POA: Diagnosis not present

## 2018-04-09 DIAGNOSIS — Z79899 Other long term (current) drug therapy: Secondary | ICD-10-CM | POA: Insufficient documentation

## 2018-04-09 DIAGNOSIS — M62838 Other muscle spasm: Secondary | ICD-10-CM | POA: Diagnosis not present

## 2018-04-09 DIAGNOSIS — M5412 Radiculopathy, cervical region: Secondary | ICD-10-CM | POA: Diagnosis not present

## 2018-04-09 DIAGNOSIS — Z79891 Long term (current) use of opiate analgesic: Secondary | ICD-10-CM | POA: Diagnosis not present

## 2018-04-09 DIAGNOSIS — M546 Pain in thoracic spine: Secondary | ICD-10-CM | POA: Diagnosis not present

## 2018-04-09 DIAGNOSIS — F329 Major depressive disorder, single episode, unspecified: Secondary | ICD-10-CM

## 2018-04-09 DIAGNOSIS — M5441 Lumbago with sciatica, right side: Secondary | ICD-10-CM | POA: Insufficient documentation

## 2018-04-09 DIAGNOSIS — Z5181 Encounter for therapeutic drug level monitoring: Secondary | ICD-10-CM | POA: Insufficient documentation

## 2018-04-09 MED ORDER — DULOXETINE HCL 20 MG PO CPEP: 20.0000 mg | ORAL_CAPSULE | Freq: Every day | ORAL | 2 refills | Status: DC

## 2018-04-09 MED ORDER — OXYMORPHONE HCL ER 40 MG PO T12A: 40.0000 mg | EXTENDED_RELEASE_TABLET | Freq: Two times a day (BID) | ORAL | 0 refills | Status: DC

## 2018-04-09 MED ORDER — OXYCODONE HCL 15 MG PO TABS: 15.0000 mg | ORAL_TABLET | Freq: Four times a day (QID) | ORAL | 0 refills | Status: DC | PRN

## 2018-04-09 NOTE — Telephone Encounter (Signed)
Placed a call to [name] [name] she states she want to try a different antidepressant. The celexa makes her too drowsy, we discussed Cymbalta the above will be discussed with Dr. Naaman Plummer, she verbalizes understanding.  Spoke with pharmacist at [facility].  The article Recommended  Decrease dose week , and then prescribe Cymbalta 30 mg, this will be discuss with Dr Naaman Plummer in the morning.

## 2018-04-09 NOTE — Progress Notes (Signed)
Subjective:    Patient ID: [name] [name], female    DOB: 09/24/1950, 67 y.o.   MRN: [id number]  HPI: [name] is a 67 year old female who returns for follow up appointment for chronic pain and medication refill. She states her pain is located in her neck radiating into her bilateral shoulders, mid-lower back pain radiating into her bilateral lower extremities. She rates her pain 8. Her current exercise regime is walking.   [name] [name] Morphine Equivalent is 330.00 MME. She is also prescribed Diazepam by Dr. Sherrie Sport .We have discussed the black box warning of using opioids and benzodiazepines.I highlighted the dangers of using these drugs together and discussed the adverse events including respiratory suppression, overdose, cognitive impairment and importance of compliance with current regimen. We will continue to monitor and adjust as indicated.   Last UDS was Performed on 02/14/18, it was consistent.    Pain Inventory Average Pain 8 Pain Right Now 8 My pain is sharp, burning, stabbing and tingling  In the last 24 hours, has pain interfered with the following? General activity 7 Relation with others 7 Enjoyment of life 7 What TIME of day is your pain at its worst? all Sleep (in general) Fair  Pain is worse with: walking, bending, sitting, standing, unsure and some activites Pain improves with: heat/ice, pacing activities and medication Relief from Meds: 9  Mobility Do you have any goals in this area?  no  Function Do you have any goals in this area?  no  Neuro/Psych No problems in this area  Prior Studies Any changes since last visit?  no  Physicians involved in your care Any changes since last visit?  no   Family History  Problem Relation Age of Onset  . Hypertension Mother   . Heart disease Mother   . Diabetes Mother   . Cancer Father   . Diabetes Brother    Social History   Socioeconomic History  . Marital status: Divorced    Spouse name: Not on  file  . Number of children: Not on file  . Years of education: Not on file  . Highest education level: Not on file  Occupational History  . Occupation: disable    Employer: DISABLED  Social Needs  . Financial resource strain: Not on file  . Food insecurity:    Worry: Not on file    Inability: Not on file  . Transportation needs:    Medical: Not on file    Non-medical: Not on file  Tobacco Use  . Smoking status: Never Smoker  . Smokeless tobacco: Never Used  Substance and Sexual Activity  . Alcohol use: No  . Drug use: No  . Sexual activity: Not on file    Comment: second hand smoke  Lifestyle  . Physical activity:    Days per week: Not on file    Minutes per session: Not on file  . Stress: Not on file  Relationships  . Social connections:    Talks on phone: Not on file    Gets together: Not on file    Attends religious service: Not on file    Active member of club or organization: Not on file    Attends meetings of clubs or organizations: Not on file    Relationship status: Not on file  Other Topics Concern  . Not on file  Social History Narrative   Has one child   Disabled   Past Surgical History:  Procedure Laterality  Date  . ABDOMINAL HYSTERECTOMY  1987   W/ BSO  . ANTERIOR CERVICAL DECOMP/DISCECTOMY FUSION N/A 08/09/2012   Procedure: ANTERIOR CERVICAL DECOMPRESSION/DISCECTOMY FUSION 1 LEVEL;  Surgeon: Floyce Stakes, MD;  Location: [facility];  Service: Neurosurgery;  Laterality: N/A;  Cervical four-five  Anterior cervical decompression/diskectomy/fusion  . ANTERIOR FUSION CERVICAL SPINE  2007   C5 -6  . APPENDECTOMY    . APPLICATION OF INTRAOPERATIVE CT SCAN N/A 05/17/2017   Procedure: APPLICATION OF INTRAOPERATIVE CT SCAN;  Surgeon: Erline Levine, MD;  Location: [place];  Service: Neurosurgery;  Laterality: N/A;  . BACK SURGERY     x 5  . BREAST EXCISIONAL BIOPSY Bilateral    No scar seen   . BREAST SURGERY     x 2 biopsies  . CARPAL TUNNEL RELEASE   RIGHT - 2001  & DEC 2011 W/ BACK SURG.  . CERVICAL DISC SURGERY  2005   C5 - 6  . CHOLECYSTECTOMY  1994  . DORSAL COMPARTMENT RELEASE Right 04/07/2013   Procedure: RIGHT WRIST STS RELEASE;  Surgeon: Schuyler Amor, MD;  Location: [place];  Service: Orthopedics;  Laterality: Right;  . EXPLORATION OF INCISION FOR CSF LEAK  DEC 2011  X2   POST LAMINECOTMY  . FINGER ARTHRODESIS Right 04/07/2013   Procedure: RIGHT INDEX AND RIGHT LONG DISTAL INTERPHALANGEAL JOINT FUSIONS;  Surgeon: Schuyler Amor, MD;  Location: [place];  Service: Orthopedics;  Laterality: Right;  . FINGER ARTHROPLASTY Right 04/07/2013   Procedure: RIGHT THUMB [place] ARTHROPLASTY;  Surgeon: Schuyler Amor, MD;  Location: [place];  Service: Orthopedics;  Laterality: Right;  . GANGLION CYST EXCISION Right 04/07/2013   Procedure: RIGHT WRIST MASS EXCISION;  Surgeon: Schuyler Amor, MD;  Location: [place] [facility];  Service: Orthopedics;  Laterality: Right;  . KNEE ARTHROSCOPY  LEFT X3 (LAST ONE 2005)  . KNEE ARTHROSCOPY  05/17/2011   Procedure: ARTHROSCOPY KNEE;  Surgeon: Bradley Ferris III;  Location: [facility];  Service: Orthopedics;  Laterality: Right;  WITH MEDIAL MENISECTOMY AND removal of suprapatella fat lump  . LEFT WRIST TENOSYNECTOMY W/ LEFT THUMB JOINT REPAIR  02-24-10  . LUMBAR FUSION  11-30-10   L4 - 5  . LUMBAR FUSION  2000   L2 - 4  . LUMBAR LAMINECTOMY  DEC 2011   L4 - 5  . POSTERIOR CERVICAL FUSION/FORAMINOTOMY N/A 05/17/2017   Procedure: Cervical five  to Thorastic one  Posterior cervical fusion with lateral mass screws/revision of prior instrumentation;  Surgeon: Erline Levine, MD;  Location: [place];  Service: Neurosurgery;  Laterality: N/A;  C5 to T1 Posterior cervical fusion with lateral mass screws/revision of prior instrumentation  . TENDON REPAIR  JAN 2010   LEFT INDEX AND LONG FINGERS  . TUBAL LIGATION     Past  Medical History:  Diagnosis Date  . Acute sinusitis, unspecified   . Anemia    "quite a few times"  . Anxiety state, unspecified   . Arachnoiditis BILATERAL LEGS   DUE TO MULTIPLE BACK SURG.'S  . Arthritis   . Asthma    last flare up was 03/2017 lasted over a month  . Blood transfusion   . Cancer ([place])    skin cancers (in scalp)  . Cardiomyopathy HX --06/2010   EF was 25% during acute illness (PHELONEPHRITIS) Repeat echo 12-06-10 60% showed normal EF.   Marland Kitchen Chronic back pain greater than 3 months duration  S/P BACK SURG'S  . CSF leak   . Diabetes mellitus without complication ([place])    dx 2013 type 2  . Dyslipidemia   . Dysphagia    some post op cerv fusion 2/14  . Dysrhythmia   . Essential hypertension, benign   . Family history of adverse reaction to anesthesia    mother gets n/v  . GERD (gastroesophageal reflux disease) AND HIATIAL HERNIA   CONTROLLED W/ NEXIUM  . Headache(784.0)   . Heart murmur    DENIES S & S   (ECHO JUN'12 W/ CHART)  . History of chronic bronchitis   . Hx of bladder infections   . Hypertension   . Murmur, heart   . Neuromuscular disorder (H[facility])    numbness and tingling  . Osteoporosis   . Other malaise and fatigue   . PONV (postoperative nausea and vomiting)   . Shortness of breath   . Spinal headache   . Spinal stenosis, cervical region   . Varicosities    venous  . Weakness of both legs DUE TO ARACHNOIDITIS   OCCASIONAL USES CANE   There were no vitals taken for this visit.  Opioid Risk Score:   Fall Risk Score:  `1  Depression screen PHQ 2/9  Depression screen [facility] 2/9 03/12/2018 10/25/2017 09/27/2017 07/02/2017 06/04/2017 05/03/2017 03/05/2017  Decreased Interest 0 0 0 1 1 0 0  Down, Depressed, Hopeless 0 0 0 1 1 0 0  PHQ - 2 Score 0 0 0 2 2 0 0  Altered sleeping - - 0 - - - -  Tired, decreased energy - - 0 - - - -  Change in appetite - - 0 - - - -  Feeling bad or failure about yourself  - - 0 - - - -  Trouble concentrating - - 0 - -  - -  Moving slowly or fidgety/restless - - 0 - - - -  Suicidal thoughts - - 0 - - - -  PHQ-9 Score - - 0 - - - -  Some recent data might be hidden     Review of Systems  Constitutional: Negative.   HENT: Negative.   Eyes: Negative.   Respiratory: Negative.   Cardiovascular: Negative.   Gastrointestinal: Negative.   Endocrine: Negative.   Genitourinary: Negative.   Musculoskeletal: Positive for arthralgias, back pain, gait problem, joint swelling and myalgias.  Skin: Negative.   Allergic/Immunologic: Negative.   Hematological: Negative.   Psychiatric/Behavioral: Negative.   All other systems reviewed and are negative.      Objective:   Physical Exam  Constitutional: She is oriented to person, place, and time. She appears well-developed and well-nourished.  HENT:  Head: Normocephalic and atraumatic.  Neck: Normal range of motion. Neck supple.  Cervical Paraspinal Tenderness: C-5-C-6  Cardiovascular: Normal rate and regular rhythm.  Pulmonary/Chest: Effort normal and breath sounds normal.  Musculoskeletal:  Normal Muscle Bulk and Muscle Testing Reveals: Upper Extremities: Full ROM and Muscle Strength 5/5 Bilateral AC Joint Tenderness Thoracic and Lumbar Hypersensitivity Lower Extremities: Decreased ROM and Muscle Strength 5/5 Arises from Table Slowly using Cane for support Narrow Based gait  Neurological: She is alert and oriented to person, place, and time.  Skin: Skin is warm and dry.  Psychiatric: She has a normal mood and affect. Her behavior is normal.  Nursing note and vitals reviewed.         Assessment & Plan:  1. On 05/17/2017 :C5C6C7 T1 Posterior Cervical Fusion with lateral mass  fixation with [facility], revision of prior instrumentation. By Dr. Vertell Limber.  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 Continuecurrent medication regimen withGabapentin800 mg QID.04/09/2018. Refilled:Oxycodone 15mg  one tablet every 6  hours as needed # 120 and Oxymorphone HCL 40 mg every 12 hours #60. We will continue the opioid monitoring program, this consists of regular clinic visits, examinations, urine drug screen, pill counts as well as use of [place] Controlled Substance Reporting System. 2. 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 04/09/2018 3. Anxiety/depression: Continue Valium, PCP Following. Discontinue  Celexa due to adverse reaction, will prescribe Cymbalta. Will speak with Dr. Tawanna Solo monitor. 04/09/2018 4. Muscle Spasms: Continuecurrent medication regime withFlexeril. Continue to Monitor. 04/09/2018 5. Cervicalgia/ Cervical Radiculitis: Continuecurrent medication regime withGabapentin: Dr. Vertell Limber Following: S/P C5C6C7 T1 Posterior Cervical Fusion with lateral mass fixation with AIRO Imaging, revision of prior instrumentation by Dr. Vertell Limber on 05/17/2017. 04/09/2018 6. Frequent Falls:No Falls since last visit. Encouraged to continue using  cane or walker at all times, she verbalizes understanding.  7. Pitting Edema: PCP Following. Reports her Pulmonologist ordered Test. 04/09/2018 8. Bilateral Thoracic Back Pain: Continue HEP as Tolerated and Continue current medication regimen. Continue to Monitor. 04/09/2018  30 minutes of face to face patient care time was spent during this visit. All questions were encouraged and answered.  F/U in 1 month

## 2018-04-11 ENCOUNTER — Telehealth: Payer: Self-pay | Admitting: Registered Nurse

## 2018-04-11 ENCOUNTER — Telehealth: Payer: Self-pay | Admitting: Internal Medicine

## 2018-04-11 ENCOUNTER — Ambulatory Visit: Payer: Medicare Other | Admitting: Registered Nurse

## 2018-04-11 ENCOUNTER — Ambulatory Visit (HOSPITAL_COMMUNITY)
Admission: RE | Admit: 2018-04-11 | Discharge: 2018-04-11 | Disposition: A | Discharge status: Home / Self Care | Payer: Medicare Other | Source: Ambulatory Visit | Attending: Internal Medicine | Admitting: Internal Medicine

## 2018-04-11 ENCOUNTER — Ambulatory Visit (INDEPENDENT_AMBULATORY_CARE_PROVIDER_SITE_OTHER)
Admission: RE | Admit: 2018-04-11 | Discharge: 2018-04-11 | Disposition: A | Discharge status: Home / Self Care | Payer: Medicare Other | Source: Ambulatory Visit | Attending: Internal Medicine | Admitting: Internal Medicine

## 2018-04-11 ENCOUNTER — Encounter: Payer: Self-pay | Admitting: Internal Medicine

## 2018-04-11 DIAGNOSIS — R05 Cough: Secondary | ICD-10-CM | POA: Diagnosis not present

## 2018-04-11 DIAGNOSIS — R059 Cough, unspecified: Secondary | ICD-10-CM

## 2018-04-11 DIAGNOSIS — I83892 Varicose veins of left lower extremities with other complications: Secondary | ICD-10-CM | POA: Diagnosis not present

## 2018-04-11 DIAGNOSIS — R918 Other nonspecific abnormal finding of lung field: Secondary | ICD-10-CM | POA: Diagnosis not present

## 2018-04-11 MED ORDER — DULOXETINE HCL 30 MG PO CPEP: 30.0000 mg | ORAL_CAPSULE | Freq: Every day | ORAL | 2 refills | Status: DC

## 2018-04-11 NOTE — Telephone Encounter (Signed)
Called and spoke with pt letting her know the results of the venous doppler.  Pt expressed understanding.nothing further needed.

## 2018-04-11 NOTE — Telephone Encounter (Signed)
Message from patient

## 2018-04-11 NOTE — Progress Notes (Signed)
Received call from vascular lab stating the patient tested negative for DVT or superficial thrombosis.Message forwarded to Dr. Chase Caller for review.

## 2018-04-11 NOTE — Progress Notes (Signed)
Patient ID: [name] [name], female   DOB: 1950/12/15, 67 y.o.   MRN: [id number]   Let her know duplex LE negative

## 2018-04-11 NOTE — Telephone Encounter (Signed)
Duplex LE - negative for DVT/SVT     SIGNATURE    Dr. Brand Males, M.D., F.C.C.P,  Pulmonary and Critical Care Medicine Staff Physician, [facility] Director - Interstitial Lung Disease  Program  Pulmonary [facility] [place], [place], [address]  Pager: [phone], If no answer or between  15:00h - 7:00h: call [phone] Telephone: [phone]  2:22 PM 04/11/2018

## 2018-04-11 NOTE — Telephone Encounter (Signed)
Discuss with Dr. Naaman Plummer Ms [name] [name] reaction to Celexa and discuss the article the  Pharmacist found regarding Celexa to Cymbalta. Dr. Naaman Plummer recommendation we will prescribe Cymbalta 30 mg daily, This was sent to [name] in My Chart and [place] was called.

## 2018-04-12 NOTE — Progress Notes (Signed)
Spoke with pt and notified of results per Dr. Ramaswamy. Pt verbalized understanding and denied any questions.  

## 2018-04-18 ENCOUNTER — Ambulatory Visit: Payer: Medicare Other | Admitting: Nurse Practitioner

## 2018-04-18 DIAGNOSIS — M7541 Impingement syndrome of right shoulder: Secondary | ICD-10-CM | POA: Diagnosis not present

## 2018-04-18 DIAGNOSIS — L988 Other specified disorders of the skin and subcutaneous tissue: Secondary | ICD-10-CM | POA: Diagnosis not present

## 2018-04-19 ENCOUNTER — Telehealth: Payer: Self-pay | Admitting: Internal Medicine

## 2018-04-19 NOTE — Telephone Encounter (Signed)
Called and spoke with pt to get a clarification on if she needed all her OV notes for her to take to Dr. Rachelle Hora or if it was just the last OV she had with MR printed so she could take with her, and pt stated she needed her last OV with MR printed so she could take it with her when she went for her appt with Dr. Rachelle Hora on 11/6.   Stated to pt that I would print it out so she could get it when she came to the office for her appt with Lazaro Arms, NP Monday, 11/4.  Have given the OV notes to Debby Bud so she can give to pt when she arrives for her appt. Nothing further needed.

## 2018-04-22 ENCOUNTER — Ambulatory Visit: Payer: Medicare Other | Admitting: Nurse Practitioner

## 2018-04-24 ENCOUNTER — Ambulatory Visit (INDEPENDENT_AMBULATORY_CARE_PROVIDER_SITE_OTHER): Payer: Medicare Other | Admitting: Vascular Surgery

## 2018-04-24 ENCOUNTER — Encounter: Payer: Self-pay | Admitting: Vascular Surgery

## 2018-04-24 ENCOUNTER — Ambulatory Visit (HOSPITAL_COMMUNITY)
Admission: RE | Admit: 2018-04-24 | Discharge: 2018-04-24 | Disposition: A | Discharge status: Home / Self Care | Payer: Medicare Other | Source: Ambulatory Visit | Attending: Vascular Surgery | Admitting: Vascular Surgery

## 2018-04-24 ENCOUNTER — Telehealth: Payer: Self-pay | Admitting: Internal Medicine

## 2018-04-24 VITALS — BP 128/80 | HR 75 | Temp 98.0°F | Resp 16 | Ht 65.0 in | Wt 194.0 lb

## 2018-04-24 DIAGNOSIS — I83812 Varicose veins of left lower extremities with pain: Secondary | ICD-10-CM | POA: Diagnosis not present

## 2018-04-24 NOTE — Progress Notes (Signed)
REASON FOR CONSULT:    Painful varicose veins of the left lower extremity  HPI:   [name] [name] is a pleasant 67 y.o. 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  Over the last 7 months she has had progressive aching and swelling in her left leg.  She describes aching pain and heaviness which is aggravated by standing and sitting and relieved somewhat with elevation.  She does take ibuprofen as needed for pain but tries to limit this as it upsets her stomach.  She has worn both knee-high compression stockings and thigh-high compression stockings without significant relief.  She began wearing the stockings several years ago.  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  She is unaware of any previous history of DVT.  She does state that she had phlebitis in her left leg in 2007 but cannot remember where this was.  Of note she had a bleeding episode from her left lateral malleolus in September 2019 and states that she had severe bleeding after injuring her lateral malleolus in the shower.  Past Medical History:  Diagnosis Date  . Acute sinusitis, unspecified   . Anemia    "quite a few times"  . Anxiety state, unspecified   . Arachnoiditis BILATERAL LEGS   DUE TO MULTIPLE BACK SURG.'S  . Arthritis   . Asthma    last flare up was 03/2017 lasted over a month  . Blood transfusion   . Cancer ([place])    skin cancers (in scalp)  . Cardiomyopathy HX --06/2010   EF was 25% during acute illness (PHELONEPHRITIS) Repeat echo 12-06-10 60% showed normal EF.   Marland Kitchen Chronic back pain greater than 3 months duration    S/P BACK SURG'S  . CSF leak   . Diabetes mellitus without complication ([place])    dx 2013 type 2  . Dyslipidemia   . Dysphagia    some post op cerv fusion 2/14  . Dysrhythmia   . Essential hypertension, benign   . Family history of adverse reaction to anesthesia    mother gets n/v  . GERD  (gastroesophageal reflux disease) AND HIATIAL HERNIA   CONTROLLED W/ NEXIUM  . Headache(784.0)   . Heart murmur    DENIES S & S   (ECHO JUN'12 W/ CHART)  . History of chronic bronchitis   . Hx of bladder infections   . Hypertension   . Murmur, heart   . Neuromuscular disorder (HCC)    numbness and tingling  . Osteoporosis   . Other malaise and fatigue   . PONV (postoperative nausea and vomiting)   . Shortness of breath   . Spinal headache   . Spinal stenosis, cervical region   . Varicosities    venous  . Weakness of both legs DUE TO ARACHNOIDITIS   OCCASIONAL USES CANE    Family History  Problem Relation Age of Onset  . Hypertension Mother   . Heart disease Mother   . Diabetes Mother   . Cancer Father   . Diabetes Brother     SOCIAL HISTORY: Social History   Socioeconomic History  . Marital status: Divorced    Spouse name: Not on file  . Number of children: Not on file  . Years of education: Not on file  . Highest education level: Not on file  Occupational History  . Occupation: disable    Employer: DISABLED  Social Needs  . Financial resource strain: Not on file  . Food insecurity:    Worry: Not on file    Inability: Not on file  . Transportation needs:    Medical: Not on file    Non-medical: Not on file  Tobacco Use  . Smoking status: Never Smoker  . Smokeless tobacco: Never Used  Substance and Sexual Activity  . Alcohol use: No  . Drug use: No  . Sexual activity: Not on file    Comment: second hand smoke  Lifestyle  . Physical activity:    Days per week: Not on file    Minutes per session: Not on file  . Stress: Not on file  Relationships  . Social connections:    Talks on phone: Not on file    Gets together: Not on file    Attends religious service: Not on file    Active member of club or organization: Not on file    Attends meetings of clubs or organizations: Not on file    Relationship status: Not on file  . Intimate partner violence:     Fear of current or ex partner: Not on file    Emotionally abused: Not on file    Physically abused: Not on file    Forced sexual activity: Not on file  Other Topics Concern  . Not on file  Social History Narrative   Has one child   Disabled    Allergies  Allergen Reactions  . Latex Itching and Rash  . Morphine And Related Hives and Itching  . Aspirin Nausea And Vomiting    Can take coated asa  . Biaxin [Clarithromycin] Nausea And Vomiting  . Clarithromycin Diarrhea  . Codeine Itching  . Darvocet [Propoxyphene N-Acetaminophen] Itching  . Hydrocodone-Acetaminophen Itching  . Lortab [Hydrocodone-Acetaminophen] Itching  . Percocet [Oxycodone-Acetaminophen] Itching  . Tramadol Nausea Only    Current Outpatient Medications  Medication Sig Dispense Refill  . alendronate (FOSAMAX) 70 MG tablet Take 70 mg by mouth every Wednesday. Take with a full glass of water on an empty stomach.     Marland Kitchen aspirin EC 81 MG tablet Take 81 mg by mouth at bedtime.     . budesonide-formoterol (SYMBICORT) 80-4.5 MCG/ACT inhaler Inhale 2 puffs into the lungs 2 (two) times daily. 1 Inhaler 6  . Calcium Carbonate-Vit D-Min 1200-1000 MG-UNIT CHEW Chew 1 tablet by mouth daily.     . cyclobenzaprine (FLEXERIL) 10 MG tablet Take 10 mg by mouth See admin instructions. Take 10mg  by mouth twice daily every other week alternating with valium    . diazepam (VALIUM) 10 MG tablet Take 10 mg by mouth See admin instructions. Take 10mg  by mouth once daily as needed every other week alternating with cyclobenzaprine 10mg     . diltiazem (CARDIZEM CD) 240 MG 24 hr capsule Take 240 mg by mouth daily.    . DULoxetine (CYMBALTA) 30 MG capsule Take 1 capsule (30 mg total) by mouth daily. 30 capsule 2  . esomeprazole (NEXIUM) 40 MG capsule Take 40 mg by mouth every morning.     Marland Kitchen estradiol (ESTRACE) 2 MG tablet Take 2 mg by mouth daily.     Marland Kitchen gabapentin (NEURONTIN) 800 MG tablet Take 1 tablet (800 mg total) by mouth 4 (four) times  daily. 120 tablet 5  . JINTELI 1-5 MG-MCG TABS tablet Take 1 tablet by mouth daily.     Marland Kitchen levalbuterol (XOPENEX HFA) 45 MCG/ACT inhaler Inhale 1-2 puffs into the lungs every  4 (four) hours as needed. For shortness of breath 1 Inhaler 6  . losartan-hydrochlorothiazide (HYZAAR) 50-12.5 MG tablet Take 1 tablet by mouth daily.  0  . metFORMIN (GLUCOPHAGE) 500 MG tablet Take 500 mg by mouth 2 (two) times daily with a meal.     . montelukast (SINGULAIR) 10 MG tablet Take 10 mg by mouth at bedtime.    . naloxone (NARCAN) nasal spray 4 mg/0.1 mL Place 1 spray into the nose once as needed (Just in case of Opiod Overdose).    . Omega-3 Fatty Acids (FISH OIL) 1000 MG CAPS Take 1 capsule by mouth 3 (three) times daily.    Marland Kitchen oxyCODONE (ROXICODONE) 15 MG immediate release tablet Take 1 tablet (15 mg total) by mouth every 6 (six) hours as needed. For pain 120 tablet 0  . Oxymorphone HCl, Crush Resist, 40 MG PO T12A Take 40 mg by mouth 2 (two) times daily. 60 tablet 0  . simvastatin (ZOCOR) 10 MG tablet Take 10 mg by mouth daily at 6 PM.     . valACYclovir (VALTREX) 500 MG tablet Take 500 mg by mouth daily.     . vitamin B-12 (CYANOCOBALAMIN) 1000 MCG tablet Take 1,000 mcg by mouth daily.      No current facility-administered medications for this visit.    Facility-Administered Medications Ordered in Other Visits  Medication Dose Route Frequency Provider Last Rate Last Dose  . levalbuterol (XOPENEX) nebulizer solution 0.63 mg  0.63 mg Nebulization Once Parrett, Tammy S, NP        REVIEW OF SYSTEMS:  [X]  denotes positive finding, [ ]  denotes negative finding Cardiac  Comments:  Chest pain or chest pressure:    Shortness of breath upon exertion:    Short of breath when lying flat:    Irregular heart rhythm:        Vascular    Pain in calf, thigh, or hip brought on by ambulation:    Pain in feet at night that wakes you up from your sleep:     Blood clot in your veins:    Leg swelling:           Pulmonary    Oxygen at home:    Productive cough:     Wheezing:         Neurologic    Sudden weakness in arms or legs:     Sudden numbness in arms or legs:     Sudden onset of difficulty speaking or slurred speech:    Temporary loss of vision in one eye:     Problems with dizziness:         Gastrointestinal    Blood in stool:     Vomited blood:         Genitourinary    Burning when urinating:     Blood in urine:        Psychiatric    Major depression:         Hematologic    Bleeding problems:    Problems with blood clotting too easily:        Skin    Rashes or ulcers:        Constitutional    Fever or chills:     PHYSICAL EXAM:   Vitals:   04/24/18 1541  BP: 128/80  Pulse: 75  Resp: 16  Temp: 98 F (36.7 C)  SpO2: 97%  Weight: 194 lb (88 kg)  Height: 5\' 5"  (1.651 m)    GENERAL: The  patient is a well-nourished female, in no acute distress. The vital signs are documented above. CARDIAC: There is a regular rate and rhythm.  VASCULAR: I do not detect carotid bruits. She has palpable femoral and pedal pulses bilaterally. She has mild bilateral lower extremity swelling.  She has telangiectasias of both thighs and both legs bilaterally. There is no significant hyperpigmentation. I did look at her left great saphenous vein myself with the SonoSite.  The vein is superficial to the mid thigh where then enters the saphenous canal and is enlarged with significant reflux.  There does appear to be some large tributaries coming off of the saphenous system on the left. PULMONARY: There is good air exchange bilaterally without wheezing or rales. ABDOMEN: Soft and non-tender with normal pitched bowel sounds.  MUSCULOSKELETAL: There are no major deformities or cyanosis. NEUROLOGIC: No focal weakness or paresthesias are detected. SKIN: There are no ulcers or rashes noted. PSYCHIATRIC: The patient has a normal affect.  DATA:    VENOUS DUPLEX: I independently interpreted  her venous duplex scan of the left lower extremity.  On the left side, there is no evidence of DVT.  There appears to be some chronic thrombus in the proximal left great saphenous vein.  There is deep venous reflux involving the common femoral vein and popliteal vein.  There is superficial venous reflux in the left great saphenous vein from the mid thigh to the proximal thigh.  The vein is significantly dilated up to 0.92 cm in the proximal thigh.  ASSESSMENT & PLAN:   CHRONIC VENOUS INSUFFICIENCY: This patient has CEAP clinical class III venous disease.  She is failed conservative therapy and I think she would be a reasonable candidate for endovenous laser ablation of the left great saphenous vein.  Based on my evaluation of the vein it looks like this could be accessed in the mid thigh where the vein enters the saphenous canal. I have discussed the indications for endovenous laser ablation of the left GSV, that is to lower the pressure in the veins and potentially help relieve the symptoms from venous hypertension. I have also discussed alternative options including conservative treatment with leg elevation, compression therapy, exercise, avoiding prolonged sitting and standing, and weight management. I have discussed the potential complications of the procedure, including, but not limited to: bleeding, bruising, leg swelling, nerve injury, skin burns, significant pain from phlebitis, deep venous thrombosis, or failure of the vein to close.  I have also explained that venous insufficiency is a chronic disease, and that the patient is at risk for recurrent varicose veins in the future.  All of the patient's questions were encouraged and answered. They are agreeable to proceed.  They would require sclerotherapy in 3 months after the ablation if these telangiectasias and small varicose veins persist.  We have also discussed conservative measures including the importance of intermittent leg elevation the  proper positioning for this, continued use of compression stockings as tolerated, the avoidance of prolonged sitting and standing in the importance of exercise.  Deitra Mayo [facility] [phone]

## 2018-04-24 NOTE — Telephone Encounter (Signed)
OV printed and given to patient, nothing further needed.

## 2018-04-25 ENCOUNTER — Ambulatory Visit (INDEPENDENT_AMBULATORY_CARE_PROVIDER_SITE_OTHER): Payer: Medicare Other | Admitting: Nurse Practitioner

## 2018-04-25 ENCOUNTER — Encounter: Payer: Self-pay | Admitting: Nurse Practitioner

## 2018-04-25 VITALS — BP 130/68 | HR 79 | Ht 65.0 in | Wt 194.0 lb

## 2018-04-25 DIAGNOSIS — Z23 Encounter for immunization: Secondary | ICD-10-CM | POA: Diagnosis not present

## 2018-04-25 DIAGNOSIS — I700 Atherosclerosis of aorta: Secondary | ICD-10-CM

## 2018-04-25 DIAGNOSIS — J398 Other specified diseases of upper respiratory tract: Secondary | ICD-10-CM

## 2018-04-25 MED ORDER — AMOXICILLIN-POT CLAVULANATE 875-125 MG PO TABS: 1.0000 | ORAL_TABLET | Freq: Two times a day (BID) | ORAL | 0 refills | Status: DC

## 2018-04-25 NOTE — Patient Instructions (Addendum)
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 Chase Caller in 3 months or sooner if needed

## 2018-04-25 NOTE — Assessment & Plan Note (Signed)
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 Chase Caller in 3 months or sooner if needed

## 2018-04-25 NOTE — Progress Notes (Signed)
@Patient  ID: [name] [name], female    DOB: 12/31/1950, 67 y.o.   MRN: [id number]  Chief Complaint  Patient presents with  . Follow-up    Referring provider: Neale Burly, MD  HPI 67 year old female former smoker with mild persistent asthma and tracheobronchomalacia followed by Dr. Chase Caller   Tests: HRCT 04/12/18 - No findings to suggest interstitial lung disease. Moderate air trapping indicative of small airways disease. 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 Innumerable tiny 1-2 mm pulmonary nodules scattered throughout the lungs bilaterally, stable dating back to 2017, considered definitively benign. Hepatic steatosis.   OV - follow up CT scan Patient presents for a follow up to discuss recent CT scan. She was seen by Dr. Chase Caller on 03/28/18 for shortness of breath and wheezing. She states that she is still having the shortness of breath and wheezing. She did not follow up with ENT as advised yet. She was also having edema at last and she followed up with vascular - had laser ablation. She is compliant with medications. She denies any fever, chest pain, or edema. She states that over the past few days er symptoms have increased and she feels like she is coming down with something. Also complains of sinus congestion.    Allergies  Allergen Reactions  . Latex Itching and Rash  . Morphine And Related Hives and Itching  . Aspirin Nausea And Vomiting    Can take coated asa  . Biaxin [Clarithromycin] Nausea And Vomiting  . Clarithromycin Diarrhea  . Codeine Itching  . Darvocet [Propoxyphene N-Acetaminophen] Itching  . Hydrocodone-Acetaminophen Itching  . Lortab [Hydrocodone-Acetaminophen] Itching  . Percocet [Oxycodone-Acetaminophen] Itching  . Tramadol Nausea Only    Immunization History  Administered Date(s) Administered  . Influenza Split 04/19/2013, 03/18/2014  . Influenza, High Dose Seasonal PF 04/25/2018  . Influenza,inj,Quad PF,6+ Mos 05/04/2015, 02/27/2017  .  Pneumococcal Polysaccharide-23 04/19/2013  . Zoster 05/30/2013    Past Medical History:  Diagnosis Date  . Acute sinusitis, unspecified   . Anemia    "quite a few times"  . Anxiety state, unspecified   . Arachnoiditis BILATERAL LEGS   DUE TO MULTIPLE BACK SURG.'S  . Arthritis   . Asthma    last flare up was 03/2017 lasted over a month  . Blood transfusion   . Cancer ([place])    skin cancers (in scalp)  . Cardiomyopathy HX --06/2010   EF was 25% during acute illness (PHELONEPHRITIS) Repeat echo 12-06-10 60% showed normal EF.   Marland Kitchen Chronic back pain greater than 3 months duration    S/P BACK SURG'S  . CSF leak   . Diabetes mellitus without complication ([place])    dx 2013 type 2  . Dyslipidemia   . Dysphagia    some post op cerv fusion 2/14  . Dysrhythmia   . Essential hypertension, benign   . Family history of adverse reaction to anesthesia    mother gets n/v  . GERD (gastroesophageal reflux disease) AND HIATIAL HERNIA   CONTROLLED W/ NEXIUM  . Headache(784.0)   . Heart murmur    DENIES S & S   (ECHO JUN'12 W/ CHART)  . History of chronic bronchitis   . Hx of bladder infections   . Hypertension   . Murmur, heart   . Neuromuscular disorder (HCC)    numbness and tingling  . Osteoporosis   . Other malaise and fatigue   . PONV (postoperative nausea and vomiting)   . Shortness of breath   . Spinal headache   .  Spinal stenosis, cervical region   . Varicosities    venous  . Weakness of both legs DUE TO ARACHNOIDITIS   OCCASIONAL USES CANE    Tobacco History: Social History   Tobacco Use  Smoking Status Never Smoker  Smokeless Tobacco Never Used   Counseling given: Yes   Outpatient Encounter Medications as of 04/25/2018  Medication Sig  . Milk Thistle 200 MG CAPS Take 1 capsule by mouth daily.  Marland Kitchen alendronate (FOSAMAX) 70 MG tablet Take 70 mg by mouth every Wednesday. Take with a full glass of water on an empty stomach.   Marland Kitchen amoxicillin-clavulanate (AUGMENTIN) 875-125  MG tablet Take 1 tablet by mouth 2 (two) times daily.  Marland Kitchen aspirin EC 81 MG tablet Take 81 mg by mouth at bedtime.   . budesonide-formoterol (SYMBICORT) 80-4.5 MCG/ACT inhaler Inhale 2 puffs into the lungs 2 (two) times daily.  . Calcium Carbonate-Vit D-Min 1200-1000 MG-UNIT CHEW Chew 1 tablet by mouth daily.   . cyclobenzaprine (FLEXERIL) 10 MG tablet Take 10 mg by mouth See admin instructions. Take 10mg  by mouth twice daily every other week alternating with valium  . diazepam (VALIUM) 10 MG tablet Take 10 mg by mouth See admin instructions. Take 10mg  by mouth once daily as needed every other week alternating with cyclobenzaprine 10mg   . diltiazem (CARDIZEM CD) 240 MG 24 hr capsule Take 240 mg by mouth daily.  . DULoxetine (CYMBALTA) 30 MG capsule Take 1 capsule (30 mg total) by mouth daily.  Marland Kitchen esomeprazole (NEXIUM) 40 MG capsule Take 40 mg by mouth every morning.   Marland Kitchen estradiol (ESTRACE) 2 MG tablet Take 2 mg by mouth daily.   Marland Kitchen gabapentin (NEURONTIN) 800 MG tablet Take 1 tablet (800 mg total) by mouth 4 (four) times daily.  Marland Kitchen JINTELI 1-5 MG-MCG TABS tablet Take 1 tablet by mouth daily.   Marland Kitchen levalbuterol (XOPENEX HFA) 45 MCG/ACT inhaler Inhale 1-2 puffs into the lungs every 4 (four) hours as needed. For shortness of breath  . losartan-hydrochlorothiazide (HYZAAR) 50-12.5 MG tablet Take 1 tablet by mouth daily.  . metFORMIN (GLUCOPHAGE) 500 MG tablet Take 500 mg by mouth 2 (two) times daily with a meal.   . montelukast (SINGULAIR) 10 MG tablet Take 10 mg by mouth at bedtime.  . naloxone (NARCAN) nasal spray 4 mg/0.1 mL Place 1 spray into the nose once as needed (Just in case of Opiod Overdose).  . Omega-3 Fatty Acids (FISH OIL) 1000 MG CAPS Take 1 capsule by mouth 3 (three) times daily.  Marland Kitchen oxyCODONE (ROXICODONE) 15 MG immediate release tablet Take 1 tablet (15 mg total) by mouth every 6 (six) hours as needed. For pain  . Oxymorphone HCl, Crush Resist, 40 MG PO T12A Take 40 mg by mouth 2 (two) times  daily.  . simvastatin (ZOCOR) 10 MG tablet Take 10 mg by mouth daily at 6 PM.   . valACYclovir (VALTREX) 500 MG tablet Take 500 mg by mouth daily.   . vitamin B-12 (CYANOCOBALAMIN) 1000 MCG tablet Take 1,000 mcg by mouth daily.    Facility-Administered Encounter Medications as of 04/25/2018  Medication  . levalbuterol (XOPENEX) nebulizer solution 0.63 mg     Review of Systems  Review of Systems  Constitutional: Negative.  Negative for chills and fever.  HENT: Positive for congestion.   Respiratory: Positive for shortness of breath and wheezing. Negative for cough.   Cardiovascular: Negative for chest pain, palpitations and leg swelling.  Gastrointestinal: Negative.   Allergic/Immunologic: Negative.   Neurological: Negative.  Psychiatric/Behavioral: Negative.        Physical Exam  BP 130/68 (BP Location: Right Arm, Patient Position: Sitting, Cuff Size: Normal)   Pulse 79   Ht 5\' 5"  (1.651 m)   Wt 194 lb (88 kg)   SpO2 96%   BMI 32.28 kg/m   Wt Readings from Last 5 Encounters:  04/25/18 194 lb (88 kg)  04/24/18 194 lb (88 kg)  02/14/18 191 lb (86.6 kg)  01/01/18 195 lb (88.5 kg)  11/19/17 195 lb (88.5 kg)     Physical Exam  Constitutional: She is oriented to person, place, and time. She appears well-developed and well-nourished. No distress.  Cardiovascular: Normal rate and regular rhythm.  Pulmonary/Chest: Effort normal and breath sounds normal. No respiratory distress. She has no wheezes. She has no rales.  Neurological: She is alert and oriented to person, place, and time.  Psychiatric: She has a normal mood and affect.  Nursing note and vitals reviewed.   Imaging: Ct Chest High Resolution  Result Date: 04/12/2018 CLINICAL DATA:  67 year old female with history of pneumonia several months ago. Persistent cough. 3 mm pulmonary nodule. EXAM: CT CHEST WITHOUT CONTRAST TECHNIQUE: Multidetector CT imaging of the chest was performed following the standard protocol  without intravenous contrast. High resolution imaging of the lungs, as well as inspiratory and expiratory imaging, was performed. COMPARISON:  Chest CT 05/04/2016. FINDINGS: Cardiovascular: Heart size is normal. There is no significant pericardial fluid, thickening or pericardial calcification. Aortic atherosclerosis. No definite coronary artery calcifications. Mediastinum/Nodes: No pathologically enlarged mediastinal or hilar lymph nodes. Please note that accurate exclusion of hilar adenopathy is limited on noncontrast CT scans. Esophagus is unremarkable in appearance. No axillary lymphadenopathy. Lungs/Pleura: High-resolution images demonstrate no significant regions of ground-glass attenuation, subpleural reticulation, parenchymal banding, traction bronchiectasis or honeycombing. Inspiratory and expiratory imaging demonstrates moderate air trapping, indicative of small airways disease. There is also some collapse of the trachea and mainstem bronchi during expiratory phase imaging indicative of mild-to-moderate tracheobronchomalacia. Innumerable tiny 1-2 mm pulmonary nodules scattered throughout the lungs bilaterally, similar to prior study from 2017, considered benign. No larger more suspicious appearing pulmonary nodules or masses are noted. No acute consolidative airspace disease. No pleural effusions. Upper Abdomen: Diffuse low attenuation throughout the hepatic parenchyma, indicative of hepatic steatosis. Status post cholecystectomy. Musculoskeletal: Orthopedic fixation hardware in the lower cervical spine. There are no aggressive appearing lytic or blastic lesions noted in the visualized portions of the skeleton. IMPRESSION: 1. No findings to suggest interstitial lung disease. 2. Moderate air trapping indicative of small airways disease. 3. 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 4. Innumerable tiny 1-2 mm pulmonary nodules scattered throughout the lungs bilaterally, stable dating back to 2017, considered  definitively benign. 5. Hepatic steatosis. Electronically Signed   By: Vinnie Langton M.D.   On: 04/12/2018 11:09   Vas [place] Lower Extremity Venous (dvt)  Result Date: 04/11/2018  Lower Venous Study Indications: Swelling.  Performing Technologist: Ralene Cork RVT  Examination Guidelines: A complete evaluation includes B-mode imaging, spectral Doppler, color Doppler, and power Doppler as needed of all accessible portions of each vessel. Bilateral testing is considered an integral part of a complete examination. Limited examinations for reoccurring indications may be performed as noted.  Right Venous Findings: +---+---------------+---------+-----------+----------+-------+    CompressibilityPhasicitySpontaneityPropertiesSummary +---+---------------+---------+-----------+----------+-------+ CFVFull           Yes      Yes                          +---+---------------+---------+-----------+----------+-------+  SFJFull                    Yes                          +---+---------------+---------+-----------+----------+-------+  Left Venous Findings: +---------+---------------+---------+-----------+----------+-------+          CompressibilityPhasicitySpontaneityPropertiesSummary +---------+---------------+---------+-----------+----------+-------+ CFV      Full           Yes      Yes                          +---------+---------------+---------+-----------+----------+-------+ SFJ      Full                    Yes                          +---------+---------------+---------+-----------+----------+-------+ FV Prox  Full           Yes      Yes                          +---------+---------------+---------+-----------+----------+-------+ FV Mid   Full           Yes      Yes                          +---------+---------------+---------+-----------+----------+-------+ FV DistalFull           Yes      Yes                           +---------+---------------+---------+-----------+----------+-------+ POP      Full           Yes      Yes                          +---------+---------------+---------+-----------+----------+-------+ PTV      Full                    Yes                          +---------+---------------+---------+-----------+----------+-------+ PERO     Full                    Yes                          +---------+---------------+---------+-----------+----------+-------+ GSV      Full           Yes      Yes                          +---------+---------------+---------+-----------+----------+-------+   Findings reported to [facility] at 1:45pm.  Summary: Right: No evidence of common femoral vein obstruction. Left: There is no evidence of deep vein thrombosis in the lower extremity.There is no evidence of superficial venous thrombosis. A cystic structure is found in the popliteal fossa.  *See table(s) above for measurements and observations. Electronically signed by Ruta Hinds MD on 04/11/2018 at 2:56:18 PM.    Final    Vas [place] Lower Extremity Venous Reflux  Result Date: 04/24/2018  Lower Venous Reflux Study  Indications: Pain, Edema, and varicosities.  Performing Technologist: Alvia Grove RVT  Examination Guidelines: A complete evaluation includes B-mode imaging, spectral Doppler, color Doppler, and power Doppler as needed of all accessible portions of each vessel. Bilateral testing is considered an integral part of a complete examination. Limited examinations for reoccurring indications may be performed as noted. The reflux portion of the exam is performed with the patient in reverse Trendelenburg.  Venous Reflux Times Normal value < 0.5 sec +------------------------------+----------+---------+                               Right (ms)Left (ms) +------------------------------+----------+---------+ CFV                                     2684.50    +------------------------------+----------+---------+ Popliteal                               1694.30   +------------------------------+----------+---------+ GSV at Saphenofemoral junction          4173.50   +------------------------------+----------+---------+ GSV prox thigh                          3865.40   +------------------------------+----------+---------+ GSV mid thigh                           3322.70   +------------------------------+----------+---------+ Vein Diameters: +------------------------------+----------+---------+                               Right (cm)Left (cm) +------------------------------+----------+---------+ GSV at Saphenofemoral junction          .73       +------------------------------+----------+---------+ GSV at prox thigh                       .92       +------------------------------+----------+---------+ GSV at mid thigh                        .54       +------------------------------+----------+---------+ GSV at distal thigh                     NV        +------------------------------+----------+---------+ GSV at knee                             NV        +------------------------------+----------+---------+ GSV prox calf                           NV        +------------------------------+----------+---------+ GSV mid calf                            .51       +------------------------------+----------+---------+ SSV origin                              .36       +------------------------------+----------+---------+ SSV prox                                .  38       +------------------------------+----------+---------+ SSV mid                                 .31       +------------------------------+----------+---------+ Varicose Vein                           .86       +------------------------------+----------+---------+  Left Reflux Technical Findings: Dialated popliteal vein measuring 1.59 x 1.67  Summary: Left:  Abnormal reflux times were noted in the common femoral vein, popliteal vein, great saphenous vein at the saphenofemoral junction, great saphenous vein at the proximal thigh, and great saphenous vein at the mid thigh. The greater saphenous vein exits fascia in the mid to distal thigh.with numerous varicosities. No DVT from the CFV to the popliteal vein. There appears to be residual thrombus in the proximal greater saphenous vein.  *See table(s) above for measurements and observations. Electronically signed by Deitra Mayo MD on 04/24/2018 at 4:52:13 PM.    Final      Assessment & Plan:   Tracheobronchomalacia Discussed HRCT results and answered questions  Patient Instructions  Will order Augmentin  Please follow up with ENT - tracheobronchomalacia  Keep follow up with vascular Use flutter valve 3 times daily Will refer to [facility] cardiology - shortness of breath/aortic atherosclerosis - may need CPST Follow up with Dr. Chase Caller in 3 months or sooner if needed     Aortic atherosclerosis [facility] Patient Instructions  Will order Augmentin  Please follow up with ENT - tracheobronchomalacia  Keep follow up with vascular Use flutter valve 3 times daily Will refer to [facility] cardiology - shortness of breath/aortic atherosclerosis - may need CPST Follow up with Dr. Chase Caller in 3 months or sooner if needed        Fenton Foy, NP 04/25/2018

## 2018-04-25 NOTE — Assessment & Plan Note (Addendum)
Discussed HRCT results and answered questions  Patient Instructions  Will order Augmentin  Please follow up with ENT - tracheobronchomalacia  Keep follow up with vascular Use flutter valve 3 times daily Will refer to [facility] cardiology - shortness of breath/aortic atherosclerosis - may need CPST Follow up with Dr. Chase Caller in 3 months or sooner if needed

## 2018-04-30 ENCOUNTER — Other Ambulatory Visit: Payer: Self-pay | Admitting: *Deleted

## 2018-04-30 DIAGNOSIS — I83812 Varicose veins of left lower extremities with pain: Secondary | ICD-10-CM

## 2018-05-02 ENCOUNTER — Other Ambulatory Visit: Payer: Self-pay | Admitting: *Deleted

## 2018-05-02 DIAGNOSIS — I83891 Varicose veins of right lower extremities with other complications: Secondary | ICD-10-CM

## 2018-05-02 NOTE — Telephone Encounter (Signed)
Pt called in c/o severe pain in her right leg. She is scheduled for laser ablation of her L GSV next Thursday. She is having trouble moving around and has swelling in her right ankle and foot. I am ordering a r/o DVT study for her. Unsure if she has reflux in that leg since we only studied the left leg. She will come in tomorrow am.

## 2018-05-03 ENCOUNTER — Ambulatory Visit (HOSPITAL_COMMUNITY)
Admission: RE | Admit: 2018-05-03 | Discharge: 2018-05-03 | Disposition: A | Discharge status: Home / Self Care | Payer: Medicare Other | Source: Ambulatory Visit | Attending: Vascular Surgery | Admitting: Vascular Surgery

## 2018-05-03 ENCOUNTER — Encounter: Payer: Self-pay | Admitting: Family

## 2018-05-03 ENCOUNTER — Ambulatory Visit (INDEPENDENT_AMBULATORY_CARE_PROVIDER_SITE_OTHER): Payer: Medicare Other | Admitting: Family

## 2018-05-03 ENCOUNTER — Other Ambulatory Visit: Payer: Self-pay

## 2018-05-03 VITALS — BP 134/83 | HR 75 | Resp 18 | Ht 65.0 in | Wt 194.0 lb

## 2018-05-03 DIAGNOSIS — M79604 Pain in right leg: Secondary | ICD-10-CM

## 2018-05-03 DIAGNOSIS — I83891 Varicose veins of right lower extremities with other complications: Secondary | ICD-10-CM | POA: Insufficient documentation

## 2018-05-03 NOTE — Progress Notes (Signed)
CC: return for DVT duplex of right leg, right leg worse pain x 3 days, known history of chronic Venous Insufficiency  History of Present Illness  [name] [name] is a 67 y.o. 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  From April 2019 to November 2019 she has had progressive aching and swelling in her left leg.  She describes aching pain and heaviness which is aggravated by standing and sitting and relieved somewhat with elevation.    She takes ibuprofen as needed for pain but tries to limit this as it upsets her stomach.  She has worn both knee-high compression stockings and thigh-high compression stockings without significant relief.  She began wearing the stockings several years ago.  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  She is unaware of any previous history of DVT.  She does state that she had phlebitis in her left leg in 2007 but cannot remember where this was.  Of note she had a bleeding episode from her left lateral malleolus in September 2019 and states that she had severe bleeding after injuring her lateral malleolus in the shower.  She has been seen twice by L. Wert RN for sclerotherapy since Dr. Scot Dock evaluated pt on 04-24-18.  When Dr. Scot Dock evaluated pt he determined that she has CEAP clinical class III venous disease.  She has failed conservative therapy and he thinks she would be a reasonable candidate for endovenous laser ablation of the left great saphenous vein.  Based on his evaluation of the vein it looks like this could be accessed in the mid thigh where the vein enters the saphenous canal. Dr. Scot Dock discussed the indications for endovenous laser ablation of the left GSV, that is to lower the pressure in the veins and potentially help relieve the symptoms from venous hypertension. He also discussed alternative options including conservative treatment with leg  elevation, compression therapy, exercise, avoiding prolonged sitting and standing, and weight management. He discussed the potential complications of the procedure, including, but not limited to: bleeding, bruising, leg swelling, nerve injury, skin burns, significant pain from phlebitis, deep venous thrombosis, or failure of the vein to close.  I have also explained that venous insufficiency is a chronic disease, and that the patient is at risk for recurrent varicose veins in the future.  All of the patient's questions were encouraged and answered. She was agreeable to proceed.  They would require sclerotherapy in 3 months after the ablation if these telangiectasias and small varicose veins persist.  Dr. Scot Dock also discussed conservative measures including the importance of intermittent leg elevation the proper positioning for this, continued use of compression stockings as tolerated, the avoidance of prolonged sitting and standing in the importance of exercise.  Venous reflux duplex on 04-24-18 demonstrated no evidence of DVT in the left leg.  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  There was deep venous reflux involving the common femoral vein and popliteal vein.  There was superficial venous reflux in the left great saphenous vein from the mid thigh to the proximal thigh.  The vein was significantly dilated up to 0.92 cm in the proximal thigh.  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 She attends pain management, Dr. Tessa Lerner,  for this. She also sees Dr. Donald Pore, neurosurgeon.  Three days ago her right leg became more  painfuil after she did some work on her deck. She keeps her compression hose on during the day. Occasionally her right foot would swell. She states she is elevating her legs.  She has had 5 back surgeries and 4 c-spine surgeries.  The pain in her right leg has eased up but is still painful.      Past Medical History:    Diagnosis Date  . Acute sinusitis, unspecified   . Anemia    "quite a few times"  . Anxiety state, unspecified   . Arachnoiditis BILATERAL LEGS   DUE TO MULTIPLE BACK SURG.'S  . Arthritis   . Asthma    last flare up was 03/2017 lasted over a month  . Blood transfusion   . Cancer ([place])    skin cancers (in scalp)  . Cardiomyopathy HX --06/2010   EF was 25% during acute illness (PHELONEPHRITIS) Repeat echo 12-06-10 60% showed normal EF.   Marland Kitchen Chronic back pain greater than 3 months duration    S/P BACK SURG'S  . CSF leak   . Diabetes mellitus without complication ([place])    dx 2013 type 2  . Dyslipidemia   . Dysphagia    some post op cerv fusion 2/14  . Dysrhythmia   . Essential hypertension, benign   . Family history of adverse reaction to anesthesia    mother gets n/v  . GERD (gastroesophageal reflux disease) AND HIATIAL HERNIA   CONTROLLED W/ NEXIUM  . Headache(784.0)   . Heart murmur    DENIES S & S   (ECHO JUN'12 W/ CHART)  . History of chronic bronchitis   . Hx of bladder infections   . Hypertension   . Murmur, heart   . Neuromuscular disorder (HCC)    numbness and tingling  . Osteoporosis   . Other malaise and fatigue   . PONV (postoperative nausea and vomiting)   . Shortness of breath   . Spinal headache   . Spinal stenosis, cervical region   . Varicosities    venous  . Weakness of both legs DUE TO ARACHNOIDITIS   OCCASIONAL USES CANE    Social History Social History   Tobacco Use  . Smoking status: Never Smoker  . Smokeless tobacco: Never Used  Substance Use Topics  . Alcohol use: No  . Drug use: No    Family History Family History  Problem Relation Age of Onset  . Hypertension Mother   . Heart disease Mother   . Diabetes Mother   . Cancer Father   . Diabetes Brother     Surgical History Past Surgical History:  Procedure Laterality Date  . ABDOMINAL HYSTERECTOMY  1987   W/ BSO  . ANTERIOR CERVICAL DECOMP/DISCECTOMY FUSION N/A 08/09/2012    Procedure: ANTERIOR CERVICAL DECOMPRESSION/DISCECTOMY FUSION 1 LEVEL;  Surgeon: Floyce Stakes, MD;  Location: [facility];  Service: Neurosurgery;  Laterality: N/A;  Cervical four-five  Anterior cervical decompression/diskectomy/fusion  . ANTERIOR FUSION CERVICAL SPINE  2007   C5 -6  . APPENDECTOMY    . APPLICATION OF INTRAOPERATIVE CT SCAN N/A 05/17/2017   Procedure: APPLICATION OF INTRAOPERATIVE CT SCAN;  Surgeon: Erline Levine, MD;  Location: [place];  Service: Neurosurgery;  Laterality: N/A;  . BACK SURGERY     x 5  . BREAST EXCISIONAL BIOPSY Bilateral    No scar seen   . BREAST SURGERY     x 2 biopsies  . CARPAL TUNNEL RELEASE  RIGHT - 2001  & DEC 2011 W/  BACK SURG.  . CERVICAL DISC SURGERY  2005   C5 - 6  . CHOLECYSTECTOMY  1994  . DORSAL COMPARTMENT RELEASE Right 04/07/2013   Procedure: RIGHT WRIST STS RELEASE;  Surgeon: Schuyler Amor, MD;  Location: [place];  Service: Orthopedics;  Laterality: Right;  . EXPLORATION OF INCISION FOR CSF LEAK  DEC 2011  X2   POST LAMINECOTMY  . FINGER ARTHRODESIS Right 04/07/2013   Procedure: RIGHT INDEX AND RIGHT LONG DISTAL INTERPHALANGEAL JOINT FUSIONS;  Surgeon: Schuyler Amor, MD;  Location: [place];  Service: Orthopedics;  Laterality: Right;  . FINGER ARTHROPLASTY Right 04/07/2013   Procedure: RIGHT THUMB [place] ARTHROPLASTY;  Surgeon: Schuyler Amor, MD;  Location: [place];  Service: Orthopedics;  Laterality: Right;  . GANGLION CYST EXCISION Right 04/07/2013   Procedure: RIGHT WRIST MASS EXCISION;  Surgeon: Schuyler Amor, MD;  Location: [place];  Service: Orthopedics;  Laterality: Right;  . KNEE ARTHROSCOPY  LEFT X3 (LAST ONE 2005)  . KNEE ARTHROSCOPY  05/17/2011   Procedure: ARTHROSCOPY KNEE;  Surgeon: Bradley Ferris III;  Location: [place];  Service: Orthopedics;  Laterality: Right;  WITH MEDIAL MENISECTOMY AND removal of  suprapatella fat lump  . LEFT WRIST TENOSYNECTOMY W/ LEFT THUMB JOINT REPAIR  02-24-10  . LUMBAR FUSION  11-30-10   L4 - 5  . LUMBAR FUSION  2000   L2 - 4  . LUMBAR LAMINECTOMY  DEC 2011   L4 - 5  . POSTERIOR CERVICAL FUSION/FORAMINOTOMY N/A 05/17/2017   Procedure: Cervical five  to Thorastic one  Posterior cervical fusion with lateral mass screws/revision of prior instrumentation;  Surgeon: Erline Levine, MD;  Location: [place];  Service: Neurosurgery;  Laterality: N/A;  C5 to T1 Posterior cervical fusion with lateral mass screws/revision of prior instrumentation  . TENDON REPAIR  JAN 2010   LEFT INDEX AND LONG FINGERS  . TUBAL LIGATION      Allergies  Allergen Reactions  . Latex Itching and Rash  . Morphine And Related Hives and Itching  . Aspirin Nausea And Vomiting    Can take coated asa  . Biaxin [Clarithromycin] Nausea And Vomiting  . Clarithromycin Diarrhea  . Codeine Itching  . Darvocet [Propoxyphene N-Acetaminophen] Itching  . Hydrocodone-Acetaminophen Itching  . Lortab [Hydrocodone-Acetaminophen] Itching  . Percocet [Oxycodone-Acetaminophen] Itching  . Tramadol Nausea Only    Current Outpatient Medications  Medication Sig Dispense Refill  . alendronate (FOSAMAX) 70 MG tablet Take 70 mg by mouth every Wednesday. Take with a full glass of water on an empty stomach.     Marland Kitchen amoxicillin-clavulanate (AUGMENTIN) 875-125 MG tablet Take 1 tablet by mouth 2 (two) times daily. 14 tablet 0  . aspirin EC 81 MG tablet Take 81 mg by mouth at bedtime.     . budesonide-formoterol (SYMBICORT) 80-4.5 MCG/ACT inhaler Inhale 2 puffs into the lungs 2 (two) times daily. 1 Inhaler 6  . Calcium Carbonate-Vit D-Min 1200-1000 MG-UNIT CHEW Chew 1 tablet by mouth daily.     . cyclobenzaprine (FLEXERIL) 10 MG tablet Take 10 mg by mouth See admin instructions. Take 10mg  by mouth twice daily every other week alternating with valium    . diazepam (VALIUM) 10 MG tablet Take 10 mg by mouth See admin  instructions. Take 10mg  by mouth once daily as needed every other week alternating with cyclobenzaprine 10mg     . diltiazem (CARDIZEM CD) 240 MG 24 hr capsule Take 240 mg  by mouth daily.    . DULoxetine (CYMBALTA) 30 MG capsule Take 1 capsule (30 mg total) by mouth daily. 30 capsule 2  . esomeprazole (NEXIUM) 40 MG capsule Take 40 mg by mouth every morning.     Marland Kitchen estradiol (ESTRACE) 2 MG tablet Take 2 mg by mouth daily.     Marland Kitchen gabapentin (NEURONTIN) 800 MG tablet Take 1 tablet (800 mg total) by mouth 4 (four) times daily. 120 tablet 5  . JINTELI 1-5 MG-MCG TABS tablet Take 1 tablet by mouth daily.     Marland Kitchen levalbuterol (XOPENEX HFA) 45 MCG/ACT inhaler Inhale 1-2 puffs into the lungs every 4 (four) hours as needed. For shortness of breath 1 Inhaler 6  . losartan-hydrochlorothiazide (HYZAAR) 50-12.5 MG tablet Take 1 tablet by mouth daily.  0  . metFORMIN (GLUCOPHAGE) 500 MG tablet Take 500 mg by mouth 2 (two) times daily with a meal.     . Milk Thistle 200 MG CAPS Take 1 capsule by mouth daily.    . montelukast (SINGULAIR) 10 MG tablet Take 10 mg by mouth at bedtime.    . naloxone (NARCAN) nasal spray 4 mg/0.1 mL Place 1 spray into the nose once as needed (Just in case of Opiod Overdose).    . Omega-3 Fatty Acids (FISH OIL) 1000 MG CAPS Take 1 capsule by mouth 3 (three) times daily.    Marland Kitchen oxyCODONE (ROXICODONE) 15 MG immediate release tablet Take 1 tablet (15 mg total) by mouth every 6 (six) hours as needed. For pain 120 tablet 0  . Oxymorphone HCl, Crush Resist, 40 MG PO T12A Take 40 mg by mouth 2 (two) times daily. 60 tablet 0  . simvastatin (ZOCOR) 10 MG tablet Take 10 mg by mouth daily at 6 PM.     . valACYclovir (VALTREX) 500 MG tablet Take 500 mg by mouth daily.     . vitamin B-12 (CYANOCOBALAMIN) 1000 MCG tablet Take 1,000 mcg by mouth daily.      No current facility-administered medications for this visit.    Facility-Administered Medications Ordered in Other Visits  Medication Dose Route  Frequency Provider Last Rate Last Dose  . levalbuterol (XOPENEX) nebulizer solution 0.63 mg  0.63 mg Nebulization Once Parrett, Tammy S, NP        REVIEW OF SYSTEMS: see HPI for pertinent positives and negatives   Physical Examination  Vitals:   05/03/18 1338  BP: 134/83  Pulse: 75  Resp: 18  SpO2: 96%  Weight: 194 lb (88 kg)  Height: 5\' 5"  (1.651 m)   Body mass index is 32.28 kg/m.  General: The patient appears her stated age. Obese female.  HEENT:  No gross abnormalities Pulmonary: Respirations are non-labored, CTAB.  Abdomen: Soft and non-tender with normal pitched bowel sounds.  Musculoskeletal: There are no major deformities.   Neurologic: No focal weakness or paresthesias are detected. Muscle strength: 4/5 in upper extremities, 3/5 in lower extremities.  Skin: There are no ulcer or rashes noted, no cellulitis. Psychiatric: The patient has normal affect. Cardiovascular: There is a regular rate and rhythm without significant murmur appreciated.    Vascular: Vessel Right Left  Radial 2+Palpable 2+Palpable  Carotid  without bruit  without bruit  Aorta Not palpable N/A  Popliteal Not palpable Not palpable  PT Palpable Palpable  DP Palpable Palpable     DATA  Right LE Venous Duplex (05-03-18): No evidence of DVT or SVT in the right lower extremity.  No evidence of common femoral vein obstruction in  the left.     Medical Decision Making  [name] [name] is a 67 y.o. female who presents with: sudden onset right leg pain 3 days ago that was worse than her baseline constant bilateral leg pain, with occasional swelling in right foot.    No DVT of the right lower extremity as demonstrated by venous duplex today.   She reports constant pain in both legs; pain in right leg worsened three days ago; this could be multifactorial. She has arachnoiditis, attends pain management for this, and has had 5 back surgeries.   It appears that she is scheduled for left GSV ablation  on 05-09-18 by Dr. Scot Dock.  Clemon Chambers, RN, MSN, FNP-C [facility] Office: [phone]  05/03/2018, 1:46 PM  Clinic MD: Donzetta Matters

## 2018-05-03 NOTE — Patient Instructions (Signed)
Varicose Veins Varicose veins are veins that have become enlarged and twisted. They are usually seen in the legs but can occur in other parts of the body as well. What are the causes? This condition is the result of valves in the veins not working properly. Valves in the veins help to return blood from the leg to the heart. If these valves are damaged, blood flows backward and backs up into the veins in the leg near the skin. This causes the veins to become larger. What increases the risk? People who are on their feet a lot, who are pregnant, or who are overweight are more likely to develop varicose veins. What are the signs or symptoms?  Bulging, twisted-appearing, bluish veins, most commonly found on the legs.  Leg pain or a feeling of heaviness. These symptoms may be worse at the end of the day.  Leg swelling.  Changes in skin color. How is this diagnosed? A health care provider can usually diagnose varicose veins by examining your legs. Your health care provider may also recommend an ultrasound of your leg veins. How is this treated? Most varicose veins can be treated at home.However, other treatments are available for people who have persistent symptoms or want to improve the cosmetic appearance of the varicose veins. These treatment options include:  Sclerotherapy. A solution is injected into the vein to close it off.  Laser treatment. A laser is used to heat the vein to close it off.  Radiofrequency vein ablation. An electrical current produced by radio waves is used to close off the vein.  Phlebectomy. The vein is surgically removed through small incisions made over the varicose vein.  Vein ligation and stripping. The vein is surgically removed through incisions made over the varicose vein after the vein has been tied (ligated). Follow these instructions at home:   Do not stand or sit in one position for long periods of time. Do not sit with your legs crossed. Rest with your  legs raised during the day.  Wear compression stockings as directed by your health care provider. These stockings help to prevent blood clots and reduce swelling in your legs.  Do not wear other tight, encircling garments around your legs, pelvis, or waist.  Walk as much as possible to increase blood flow.  Raise the foot of your bed at night with 2-inch blocks.  If you get a cut in the skin over the vein and the vein bleeds, lie down with your leg raised and press on it with a clean cloth until the bleeding stops. Then place a bandage (dressing) on the cut. See your health care provider if it continues to bleed. Contact a health care provider if:  The skin around your ankle starts to break down.  You have pain, redness, tenderness, or hard swelling in your leg over a vein.  You are uncomfortable because of leg pain. This information is not intended to replace advice given to you by your health care provider. Make sure you discuss any questions you have with your health care provider. Document Released: 03/15/2005 Document Revised: 11/11/2015 Document Reviewed: 12/07/2015 [employer] Interactive Patient Education  2017 [employer]  

## 2018-05-06 ENCOUNTER — Encounter: Payer: Medicare Other | Attending: Physical Medicine & Rehabilitation | Admitting: Physical Medicine & Rehabilitation

## 2018-05-06 ENCOUNTER — Encounter: Payer: Self-pay | Admitting: Physical Medicine & Rehabilitation

## 2018-05-06 VITALS — BP 136/89 | HR 95 | Resp 14 | Ht 65.0 in | Wt 189.0 lb

## 2018-05-06 DIAGNOSIS — Z5181 Encounter for therapeutic drug level monitoring: Secondary | ICD-10-CM | POA: Diagnosis not present

## 2018-05-06 DIAGNOSIS — Z79899 Other long term (current) drug therapy: Secondary | ICD-10-CM | POA: Insufficient documentation

## 2018-05-06 DIAGNOSIS — M5441 Lumbago with sciatica, right side: Secondary | ICD-10-CM | POA: Insufficient documentation

## 2018-05-06 DIAGNOSIS — G894 Chronic pain syndrome: Secondary | ICD-10-CM

## 2018-05-06 DIAGNOSIS — M5416 Radiculopathy, lumbar region: Secondary | ICD-10-CM

## 2018-05-06 DIAGNOSIS — G039 Meningitis, unspecified: Secondary | ICD-10-CM | POA: Diagnosis not present

## 2018-05-06 DIAGNOSIS — M961 Postlaminectomy syndrome, not elsewhere classified: Secondary | ICD-10-CM | POA: Insufficient documentation

## 2018-05-06 DIAGNOSIS — F418 Other specified anxiety disorders: Secondary | ICD-10-CM | POA: Diagnosis not present

## 2018-05-06 DIAGNOSIS — M5412 Radiculopathy, cervical region: Secondary | ICD-10-CM

## 2018-05-06 MED ORDER — GABAPENTIN 800 MG PO TABS: 800.0000 mg | ORAL_TABLET | Freq: Four times a day (QID) | ORAL | 5 refills | Status: DC

## 2018-05-06 MED ORDER — OXYMORPHONE HCL ER 40 MG PO T12A: 40.0000 mg | EXTENDED_RELEASE_TABLET | Freq: Two times a day (BID) | ORAL | 0 refills | Status: DC

## 2018-05-06 MED ORDER — DULOXETINE HCL 60 MG PO CPEP: 60.0000 mg | ORAL_CAPSULE | Freq: Every day | ORAL | 3 refills | Status: DC

## 2018-05-06 MED ORDER — OXYCODONE HCL 15 MG PO TABS: 15.0000 mg | ORAL_TABLET | Freq: Four times a day (QID) | ORAL | 0 refills | Status: DC | PRN

## 2018-05-06 NOTE — Progress Notes (Signed)
Subjective:    Patient ID: [name] [name], female    DOB: Feb 10, 1951, 67 y.o.   MRN: [id number]  HPI   [name] [name] is here in follow up of her chronic pain. Her pain is fairly stable in both of her legs.  She has had some problems however, over the last 7 or 8 months with swelling in the left leg.  She was found to have a thrombus/phlebitis in the greater saphenous vein.  She has procedure scheduled next week with vascular surgery.  In the meantime she has been dealing with swelling and pain in the left leg which worsens while she standing and with activity.  For pain she remains on oxycodone 15 mg every 6 hours as needed as well as oxymorphone 40 mg every 12 hours.  Additionally she is using gabapentin 800 mg 4 times daily.  Cymbalta also help with neuropathic pain as well as her depression, 30 mg daily.  She has spoken with neurosurgery who is really nothing new surgical to offer.  There has been discussion with neurosurgery, as we have, about a spinal stimulator.  She is not anxious to pursue this.  Apparently her husband had a spinal stimulator as well 6 years ago and is no longer using the device.  She is anxious to have the swelling remedied in her left leg because she wants to begin walking more and exercising again.  Pain Inventory Average Pain 8 Pain Right Now 8 My pain is sharp, burning, stabbing and tingling  In the last 24 hours, has pain interfered with the following? General activity 8 Relation with others 8 Enjoyment of life 8 What TIME of day is your pain at its worst? alll Sleep (in general) Fair  Pain is worse with: walking, sitting, inactivity, standing and some activites Pain improves with: rest, heat/ice and medication Relief from Meds: 8  Mobility Do you have any goals in this area?  no  Function Do you have any goals in this area?  no  Neuro/Psych No problems in this area  Prior Studies Any changes since last visit?  no  Physicians involved in your  care     Family History  Problem Relation Age of Onset  . Hypertension Mother   . Heart disease Mother   . Diabetes Mother   . Cancer Father   . Diabetes Brother    Social History   Socioeconomic History  . Marital status: Divorced    Spouse name: Not on file  . Number of children: Not on file  . Years of education: Not on file  . Highest education level: Not on file  Occupational History  . Occupation: disable    Employer: DISABLED  Social Needs  . Financial resource strain: Not on file  . Food insecurity:    Worry: Not on file    Inability: Not on file  . Transportation needs:    Medical: Not on file    Non-medical: Not on file  Tobacco Use  . Smoking status: Never Smoker  . Smokeless tobacco: Never Used  Substance and Sexual Activity  . Alcohol use: No  . Drug use: No  . Sexual activity: Not on file    Comment: second hand smoke  Lifestyle  . Physical activity:    Days per week: Not on file    Minutes per session: Not on file  . Stress: Not on file  Relationships  . Social connections:    Talks on phone: Not on file  Gets together: Not on file    Attends religious service: Not on file    Active member of club or organization: Not on file    Attends meetings of clubs or organizations: Not on file    Relationship status: Not on file  Other Topics Concern  . Not on file  Social History Narrative   Has one child   Disabled   Past Surgical History:  Procedure Laterality Date  . ABDOMINAL HYSTERECTOMY  1987   W/ BSO  . ANTERIOR CERVICAL DECOMP/DISCECTOMY FUSION N/A 08/09/2012   Procedure: ANTERIOR CERVICAL DECOMPRESSION/DISCECTOMY FUSION 1 LEVEL;  Surgeon: Floyce Stakes, MD;  Location: [facility];  Service: Neurosurgery;  Laterality: N/A;  Cervical four-five  Anterior cervical decompression/diskectomy/fusion  . ANTERIOR FUSION CERVICAL SPINE  2007   C5 -6  . APPENDECTOMY    . APPLICATION OF INTRAOPERATIVE CT SCAN N/A 05/17/2017   Procedure:  APPLICATION OF INTRAOPERATIVE CT SCAN;  Surgeon: Erline Levine, MD;  Location: [place];  Service: Neurosurgery;  Laterality: N/A;  . BACK SURGERY     x 5  . BREAST EXCISIONAL BIOPSY Bilateral    No scar seen   . BREAST SURGERY     x 2 biopsies  . CARPAL TUNNEL RELEASE  RIGHT - 2001  & DEC 2011 W/ BACK SURG.  . CERVICAL DISC SURGERY  2005   C5 - 6  . CHOLECYSTECTOMY  1994  . DORSAL COMPARTMENT RELEASE Right 04/07/2013   Procedure: RIGHT WRIST STS RELEASE;  Surgeon: Schuyler Amor, MD;  Location: [place];  Service: Orthopedics;  Laterality: Right;  . EXPLORATION OF INCISION FOR CSF LEAK  DEC 2011  X2   POST LAMINECOTMY  . FINGER ARTHRODESIS Right 04/07/2013   Procedure: RIGHT INDEX AND RIGHT LONG DISTAL INTERPHALANGEAL JOINT FUSIONS;  Surgeon: Schuyler Amor, MD;  Location: [place];  Service: Orthopedics;  Laterality: Right;  . FINGER ARTHROPLASTY Right 04/07/2013   Procedure: RIGHT THUMB [place] ARTHROPLASTY;  Surgeon: Schuyler Amor, MD;  Location: [place];  Service: Orthopedics;  Laterality: Right;  . GANGLION CYST EXCISION Right 04/07/2013   Procedure: RIGHT WRIST MASS EXCISION;  Surgeon: Schuyler Amor, MD;  Location: [place];  Service: Orthopedics;  Laterality: Right;  . KNEE ARTHROSCOPY  LEFT X3 (LAST ONE 2005)  . KNEE ARTHROSCOPY  05/17/2011   Procedure: ARTHROSCOPY KNEE;  Surgeon: Bradley Ferris III;  Location: [place];  Service: Orthopedics;  Laterality: Right;  WITH MEDIAL MENISECTOMY AND removal of suprapatella fat lump  . LEFT WRIST TENOSYNECTOMY W/ LEFT THUMB JOINT REPAIR  02-24-10  . LUMBAR FUSION  11-30-10   L4 - 5  . LUMBAR FUSION  2000   L2 - 4  . LUMBAR LAMINECTOMY  DEC 2011   L4 - 5  . POSTERIOR CERVICAL FUSION/FORAMINOTOMY N/A 05/17/2017   Procedure: Cervical five  to Thorastic one  Posterior cervical fusion with lateral mass screws/revision of prior instrumentation;   Surgeon: Erline Levine, MD;  Location: [place];  Service: Neurosurgery;  Laterality: N/A;  C5 to T1 Posterior cervical fusion with lateral mass screws/revision of prior instrumentation  . TENDON REPAIR  JAN 2010   LEFT INDEX AND LONG FINGERS  . TUBAL LIGATION     Past Medical History:  Diagnosis Date  . Acute sinusitis, unspecified   . Anemia    "quite a few times"  . Anxiety state, unspecified   . Arachnoiditis BILATERAL LEGS  DUE TO MULTIPLE BACK SURG.'S  . Arthritis   . Asthma    last flare up was 03/2017 lasted over a month  . Blood transfusion   . Cancer ([place])    skin cancers (in scalp)  . Cardiomyopathy HX --06/2010   EF was 25% during acute illness (PHELONEPHRITIS) Repeat echo 12-06-10 60% showed normal EF.   Marland Kitchen Chronic back pain greater than 3 months duration    S/P BACK SURG'S  . CSF leak   . Diabetes mellitus without complication ([place])    dx 2013 type 2  . Dyslipidemia   . Dysphagia    some post op cerv fusion 2/14  . Dysrhythmia   . Essential hypertension, benign   . Family history of adverse reaction to anesthesia    mother gets n/v  . GERD (gastroesophageal reflux disease) AND HIATIAL HERNIA   CONTROLLED W/ NEXIUM  . Headache(784.0)   . Heart murmur    DENIES S & S   (ECHO JUN'12 W/ CHART)  . History of chronic bronchitis   . Hx of bladder infections   . Hypertension   . Murmur, heart   . Neuromuscular disorder (HCC)    numbness and tingling  . Osteoporosis   . Other malaise and fatigue   . PONV (postoperative nausea and vomiting)   . Shortness of breath   . Spinal headache   . Spinal stenosis, cervical region   . Varicosities    venous  . Weakness of both legs DUE TO ARACHNOIDITIS   OCCASIONAL USES CANE   BP 136/89 (BP Location: Left Arm, Patient Position: Sitting, Cuff Size: Normal)   Pulse 95   Resp 14   Ht 5\' 5"  (1.651 m)   Wt 189 lb (85.7 kg) Comment: patient reported, patient refuses to stand on our scale  SpO2 94%   BMI 31.45 kg/m    Opioid Risk Score:   Fall Risk Score:  `1  Depression screen PHQ 2/9  Depression screen [facility] 2/9 03/12/2018 10/25/2017 09/27/2017 07/02/2017 06/04/2017 05/03/2017 03/05/2017  Decreased Interest 0 0 0 1 1 0 0  Down, Depressed, Hopeless 0 0 0 1 1 0 0  PHQ - 2 Score 0 0 0 2 2 0 0  Altered sleeping - - 0 - - - -  Tired, decreased energy - - 0 - - - -  Change in appetite - - 0 - - - -  Feeling bad or failure about yourself  - - 0 - - - -  Trouble concentrating - - 0 - - - -  Moving slowly or fidgety/restless - - 0 - - - -  Suicidal thoughts - - 0 - - - -  PHQ-9 Score - - 0 - - - -  Some recent data might be hidden    Review of Systems  Constitutional: Negative.   HENT: Negative.   Eyes: Negative.   Respiratory: Negative.   Cardiovascular: Negative.   Gastrointestinal: Negative.   Endocrine: Negative.   Musculoskeletal: Positive for arthralgias, back pain, gait problem, neck pain and neck stiffness.  Skin: Negative.   Allergic/Immunologic: Negative.   Hematological: Negative.   Psychiatric/Behavioral: Negative.        Objective:   Physical Exam General: No acute distress HEENT: EOMI, oral membranes moist Cards: reg rate  Chest: normal effort Abdomen: Soft, NT, ND Skin: dry, intact Extremities: 1+ edema left lower extremity. Musculoskeletal:LB TTP, limited rom  Neurological: She isalertand oriented to person, place, and time. UE Motor: motor 4/5  prox to distal. LE sensory loss prox to distal.  Skin: Skin iswarmand dry.  Psychiatric:in good spirits.  pleasant     Assessment & Plan:  1. On 05/17/2017 :C5C6C7 T1 Posterior Cervical Fusion with lateral mass fixation with AIRO Imaging, revision of prior instrumentation. By Dr. Vertell Limber. 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 -Continue Gabapentin as written   -refilledOxycodone 15mg  one tablet every 6 hours as needed  #120 -Oxymorphone HCL 40 mg every 12 hours #60.  -We will continue the controlled substance monitoring program, this consists of regular clinic visits, examinations, routine drug screening, pill counts as well as use of [place] Controlled Substance Reporting System. [place].       2. Lumbar arachnoiditis: gabapentin, cymbalta, increase to 60mg   -?spinal stimulator.  -maximize physical activity  3. Anxiety/depression: Continue Valium,          -cymbalta---increase to 60mg  daily------watch HR 4. Muscle Spasms: Continue Flexeril. Continue to Monitor.  5. Left lower ext DVT/phlebitis--for vascular procedure next week.    -compressive garments for legs       15 minutes of face to face patient care time was spent during this visit. All questions were encouraged and answered.  F/U in 1 monthwith NP

## 2018-05-06 NOTE — Patient Instructions (Signed)
PLEASE FEEL FREE TO CALL OUR OFFICE WITH ANY PROBLEMS OR QUESTIONS ([phone])      

## 2018-05-07 ENCOUNTER — Ambulatory Visit: Payer: Medicare Other | Admitting: Physical Medicine & Rehabilitation

## 2018-05-09 ENCOUNTER — Encounter: Payer: Self-pay | Admitting: Vascular Surgery

## 2018-05-09 ENCOUNTER — Ambulatory Visit (INDEPENDENT_AMBULATORY_CARE_PROVIDER_SITE_OTHER): Payer: Medicare Other | Admitting: Vascular Surgery

## 2018-05-09 VITALS — BP 135/77 | HR 78 | Temp 98.2°F | Resp 16 | Ht 65.0 in | Wt 189.0 lb

## 2018-05-09 DIAGNOSIS — I83812 Varicose veins of left lower extremities with pain: Secondary | ICD-10-CM | POA: Diagnosis not present

## 2018-05-09 NOTE — Progress Notes (Signed)
Patient name: [name] [name] MRN: [id number] DOB: February 02, 1951 Sex: female  REASON FOR VISIT:   For endovenous laser ablation of the left great saphenous vein  HPI:   [name] [name] is a pleasant 67 y.o. female who I last saw on 04/24/2018.  The patient has significant leg pain associated with her varicose veins and venous reflux of the left great saphenous vein.  She is failed conservative treatment and presents for endovenous laser ablation of the left great saphenous vein.  Current Outpatient Medications  Medication Sig Dispense Refill  . alendronate (FOSAMAX) 70 MG tablet Take 70 mg by mouth every Wednesday. Take with a full glass of water on an empty stomach.     Marland Kitchen amoxicillin-clavulanate (AUGMENTIN) 875-125 MG tablet Take 1 tablet by mouth 2 (two) times daily. 14 tablet 0  . aspirin EC 81 MG tablet Take 81 mg by mouth at bedtime.     . budesonide-formoterol (SYMBICORT) 80-4.5 MCG/ACT inhaler Inhale 2 puffs into the lungs 2 (two) times daily. 1 Inhaler 6  . Calcium Carbonate-Vit D-Min 1200-1000 MG-UNIT CHEW Chew 1 tablet by mouth daily.     . cyclobenzaprine (FLEXERIL) 10 MG tablet Take 10 mg by mouth See admin instructions. Take 10mg  by mouth twice daily every other week alternating with valium    . diazepam (VALIUM) 10 MG tablet Take 10 mg by mouth See admin instructions. Take 10mg  by mouth once daily as needed every other week alternating with cyclobenzaprine 10mg     . diltiazem (CARDIZEM CD) 240 MG 24 hr capsule Take 240 mg by mouth daily.    . DULoxetine (CYMBALTA) 60 MG capsule Take 1 capsule (60 mg total) by mouth daily. 30 capsule 3  . esomeprazole (NEXIUM) 40 MG capsule Take 40 mg by mouth every morning.     Marland Kitchen estradiol (ESTRACE) 2 MG tablet Take 2 mg by mouth daily.     Marland Kitchen gabapentin (NEURONTIN) 800 MG tablet Take 1 tablet (800 mg total) by mouth 4 (four) times daily. 120 tablet 5  . JINTELI 1-5 MG-MCG TABS tablet Take 1 tablet by mouth daily.     Marland Kitchen levalbuterol (XOPENEX HFA) 45  MCG/ACT inhaler Inhale 1-2 puffs into the lungs every 4 (four) hours as needed. For shortness of breath 1 Inhaler 6  . losartan-hydrochlorothiazide (HYZAAR) 50-12.5 MG tablet Take 1 tablet by mouth daily.  0  . metFORMIN (GLUCOPHAGE) 500 MG tablet Take 500 mg by mouth 2 (two) times daily with a meal.     . Milk Thistle 200 MG CAPS Take 1 capsule by mouth daily.    . montelukast (SINGULAIR) 10 MG tablet Take 10 mg by mouth at bedtime.    . naloxone (NARCAN) nasal spray 4 mg/0.1 mL Place 1 spray into the nose once as needed (Just in case of Opiod Overdose).    . Omega-3 Fatty Acids (FISH OIL) 1000 MG CAPS Take 1 capsule by mouth 3 (three) times daily.    Marland Kitchen oxyCODONE (ROXICODONE) 15 MG immediate release tablet Take 1 tablet (15 mg total) by mouth every 6 (six) hours as needed. For pain 120 tablet 0  . Oxymorphone HCl, Crush Resist, 40 MG PO T12A Take 40 mg by mouth 2 (two) times daily. 60 tablet 0  . simvastatin (ZOCOR) 10 MG tablet Take 10 mg by mouth daily at 6 PM.     . valACYclovir (VALTREX) 500 MG tablet Take 500 mg by mouth daily.     . vitamin B-12 (CYANOCOBALAMIN) 1000 MCG  tablet Take 1,000 mcg by mouth daily.      No current facility-administered medications for this visit.    Facility-Administered Medications Ordered in Other Visits  Medication Dose Route Frequency Provider Last Rate Last Dose  . levalbuterol (XOPENEX) nebulizer solution 0.63 mg  0.63 mg Nebulization Once Parrett, Tammy S, NP        REVIEW OF SYSTEMS:  [X]  denotes positive finding, [ ]  denotes negative finding Vascular    Leg swelling    Cardiac    Chest pain or chest pressure:    Shortness of breath upon exertion:    Short of breath when lying flat:    Irregular heart rhythm:    Constitutional    Fever or chills:     PHYSICAL EXAM:   Vitals:   05/09/18 1039  BP: 135/77  Pulse: 78  Resp: 16  Temp: 98.2 F (36.8 C)  SpO2: 98%  Weight: 189 lb (85.7 kg)  Height: 5\' 5"  (1.651 m)    GENERAL: The  patient is a well-nourished female, in no acute distress. The vital signs are documented above.  DATA:   No new data  MEDICAL ISSUES:   ENDOVENOUS LASER ABLATION LEFT GREAT SAPHENOUS VEIN: The patient was brought to the exam room and placed in the supine position.  I examined the great saphenous vein in the left thigh with the SonoSite.  The vein was superficial into the mid thigh where then enters the saphenous canal.  I felt this was the best site to cannulate the vein.  The saphenofemoral junction could be clearly identified in the longitudinal axis.  After the skin was anesthetized with 4% lidocaine, and under ultrasound guidance, the left great saphenous vein was cannulated in the mid thigh with a micropuncture needle and a micropuncture sheath introduced over wire.  The J-wire would not advance and therefore had to use the straight into the wire to get up to just below the saphenofemoral junction.  The sheath was then advanced over the wire and the wire removed.  The laser fiber was then positioned approximately 3 cm below the saphenofemoral junction and the sheath retracted slightly.  The skin was anesthetized with 1% lidocaine and then tumescent anesthesia was infiltrated along the entire length of the great saphenous vein to the saphenofemoral junction.  Patient was then placed in Trendelenburg.  After laser safety precautions were taken endovenous laser ablation was performed of the left great saphenous vein from below the saphenofemoral junction to the mid thigh.  A total of 1026 J were used.  Patient tolerated the procedure well.  Sterile dressing was applied.  Patient will return in 1 week for follow-up duplex.  Deitra Mayo [facility] [phone]

## 2018-05-09 NOTE — Progress Notes (Signed)
     Laser Ablation Procedure    Date: 05/09/2018   [name] [name] DOB:17-Oct-1950  Consent signed: Yes    Surgeon:  Dr. Sherren Mocha Early  Procedure: Laser Ablation: left Greater Saphenous Vein  BP 135/77   Pulse 78   Temp 98.2 F (36.8 C)   Resp 16   Ht 5\' 5"  (1.651 m)   Wt 189 lb (85.7 kg)   SpO2 98%   BMI 31.45 kg/m   Tumescent Anesthesia: 260 cc 0.9% NaCl with 50 cc Lidocaine HCL with 1% Epi and 15 cc 8.4% NaHCO3  Local Anesthesia: 4 cc Lidocaine HCL and NaHCO3 (ratio 2:1)  15 watts continuous mode        Total energy: 1026   Total time: 1:08    Patient tolerated procedure well  Notes:    Description of Procedure:  After marking the course of the secondary varicosities, the patient was placed on the operating table in the supine position, and the left leg was prepped and draped in sterile fashion.   Local anesthetic was administered and under ultrasound guidance the saphenous vein was accessed with a micro needle and guide wire; then the mirco puncture sheath was placed.  A guide wire was inserted saphenofemoral junction , followed by a 5 french sheath.  The position of the sheath and then the laser fiber below the junction was confirmed using the ultrasound.  Tumescent anesthesia was administered along the course of the saphenous vein using ultrasound guidance. The patient was placed in Trendelenburg position and protective laser glasses were placed on patient and staff, and the laser was fired at 15 watts continuous mode advancing 1-56mm/second for a total of 1026 joules.     Steri strips were applied to the stab wounds and ABD pads and thigh high compression stockings were applied.  Ace wrap bandages were applied over the phlebectomy sites and at the top of the saphenofemoral junction. Blood loss was less than 15 cc.  The patient ambulated out of the operating room having tolerated the procedure well.

## 2018-05-10 ENCOUNTER — Encounter: Payer: Self-pay | Admitting: Vascular Surgery

## 2018-05-15 ENCOUNTER — Ambulatory Visit (INDEPENDENT_AMBULATORY_CARE_PROVIDER_SITE_OTHER): Payer: Medicare Other | Admitting: Vascular Surgery

## 2018-05-15 ENCOUNTER — Telehealth: Payer: Self-pay | Admitting: Internal Medicine

## 2018-05-15 ENCOUNTER — Encounter: Payer: Self-pay | Admitting: Vascular Surgery

## 2018-05-15 ENCOUNTER — Ambulatory Visit (HOSPITAL_COMMUNITY)
Admission: RE | Admit: 2018-05-15 | Discharge: 2018-05-15 | Disposition: A | Discharge status: Home / Self Care | Payer: Medicare Other | Source: Ambulatory Visit | Attending: Internal Medicine | Admitting: Internal Medicine

## 2018-05-15 VITALS — BP 141/73 | HR 83 | Temp 97.6°F | Resp 16 | Ht 65.0 in | Wt 189.0 lb

## 2018-05-15 DIAGNOSIS — I83812 Varicose veins of left lower extremities with pain: Secondary | ICD-10-CM

## 2018-05-15 NOTE — Telephone Encounter (Signed)
Called and spoke with ashlyn from heart care. She apologized and stated that she would call the patient now. Called patient unable to reach left message to give [place] a call back.

## 2018-05-15 NOTE — Progress Notes (Signed)
Patient name: [name] [name] MRN: [id number] DOB: [birth year] Sex: female  REASON FOR VISIT:   Follow-up after endovenous laser ablation of the left great saphenous vein 05/09/2018.  HPI:   [name] [name] is a pleasant 67 y.o. female who I have been following with painful varicose veins of the left lower extremity.  She had failed conservative treatment and presents for endovenous laser ablation of the left great saphenous vein which was performed on 05/09/2018.  Her discomfort has been well controlled with ibuprofen.  She has been wearing her thigh-high compression stockings.  She has been gradually resuming her normal activities and has no specific complaints.  She had some bruising in the thigh which has improved.   Current Outpatient Medications  Medication Sig Dispense Refill  . alendronate (FOSAMAX) 70 MG tablet Take 70 mg by mouth every Wednesday. Take with a full glass of water on an empty stomach.     Marland Kitchen amoxicillin-clavulanate (AUGMENTIN) 875-125 MG tablet Take 1 tablet by mouth 2 (two) times daily. 14 tablet 0  . aspirin EC 81 MG tablet Take 81 mg by mouth at bedtime.     . budesonide-formoterol (SYMBICORT) 80-4.5 MCG/ACT inhaler Inhale 2 puffs into the lungs 2 (two) times daily. 1 Inhaler 6  . Calcium Carbonate-Vit D-Min 1200-1000 MG-UNIT CHEW Chew 1 tablet by mouth daily.     . cyclobenzaprine (FLEXERIL) 10 MG tablet Take 10 mg by mouth See admin instructions. Take 10mg  by mouth twice daily every other week alternating with valium    . diazepam (VALIUM) 10 MG tablet Take 10 mg by mouth See admin instructions. Take 10mg  by mouth once daily as needed every other week alternating with cyclobenzaprine 10mg     . diltiazem (CARDIZEM CD) 240 MG 24 hr capsule Take 240 mg by mouth daily.    . DULoxetine (CYMBALTA) 60 MG capsule Take 1 capsule (60 mg total) by mouth daily. 30 capsule 3  . esomeprazole (NEXIUM) 40 MG capsule Take 40 mg by mouth every morning.     Marland Kitchen estradiol (ESTRACE) 2 MG  tablet Take 2 mg by mouth daily.     Marland Kitchen gabapentin (NEURONTIN) 800 MG tablet Take 1 tablet (800 mg total) by mouth 4 (four) times daily. 120 tablet 5  . JINTELI 1-5 MG-MCG TABS tablet Take 1 tablet by mouth daily.     Marland Kitchen levalbuterol (XOPENEX HFA) 45 MCG/ACT inhaler Inhale 1-2 puffs into the lungs every 4 (four) hours as needed. For shortness of breath 1 Inhaler 6  . losartan-hydrochlorothiazide (HYZAAR) 50-12.5 MG tablet Take 1 tablet by mouth daily.  0  . metFORMIN (GLUCOPHAGE) 500 MG tablet Take 500 mg by mouth 2 (two) times daily with a meal.     . Milk Thistle 200 MG CAPS Take 1 capsule by mouth daily.    . montelukast (SINGULAIR) 10 MG tablet Take 10 mg by mouth at bedtime.    . naloxone (NARCAN) nasal spray 4 mg/0.1 mL Place 1 spray into the nose once as needed (Just in case of Opiod Overdose).    . Omega-3 Fatty Acids (FISH OIL) 1000 MG CAPS Take 1 capsule by mouth 3 (three) times daily.    Marland Kitchen oxyCODONE (ROXICODONE) 15 MG immediate release tablet Take 1 tablet (15 mg total) by mouth every 6 (six) hours as needed. For pain 120 tablet 0  . Oxymorphone HCl, Crush Resist, 40 MG PO T12A Take 40 mg by mouth 2 (two) times daily. 60 tablet 0  . simvastatin (ZOCOR)  10 MG tablet Take 10 mg by mouth daily at 6 PM.     . valACYclovir (VALTREX) 500 MG tablet Take 500 mg by mouth daily.     . vitamin B-12 (CYANOCOBALAMIN) 1000 MCG tablet Take 1,000 mcg by mouth daily.      No current facility-administered medications for this visit.    Facility-Administered Medications Ordered in Other Visits  Medication Dose Route Frequency Provider Last Rate Last Dose  . levalbuterol (XOPENEX) nebulizer solution 0.63 mg  0.63 mg Nebulization Once Parrett, Tammy S, NP        REVIEW OF SYSTEMS:  [X]  denotes positive finding, [ ]  denotes negative finding Vascular    Leg swelling    Cardiac    Chest pain or chest pressure:    Shortness of breath upon exertion:    Short of breath when lying flat:    Irregular  heart rhythm:    Constitutional    Fever or chills:     PHYSICAL EXAM:   Vitals:   05/15/18 1027  BP: (!) 141/73  Pulse: 83  Resp: 16  Temp: 97.6 F (36.4 C)  SpO2: 99%  Weight: 189 lb (85.7 kg)  Height: 5\' 5"  (1.651 m)    GENERAL: The patient is a well-nourished female, in no acute distress. The vital signs are documented above. CARDIOVASCULAR: There is a regular rate and rhythm. PULMONARY: There is good air exchange bilaterally without wheezing or rales. She has moderate bruising in the proximal medial left thigh. She has some varicose veins in the medial left calf and telangiectasias in the left leg.  DATA:   DUPLEX: I have independently interpreted her duplex scan today.  The great saphenous vein is ablated to within 2.2 cm of the saphenofemoral junction.  There is no evidence of deep venous thrombosis.  MEDICAL ISSUES:   STATUS POST ENDOVENOUS LASER ABLATION LEFT GREAT SAPHENOUS VEIN: The patient is doing well status post laser ablation of the left great saphenous vein.  She does have some spider veins and varicose veins in the left leg.  She will return in 3 months we will see how these are doing if she is continuing to have symptoms from these she could potentially be considered for sclerotherapy.  She knows to continue wearing her thigh-high stocking for another week.  She will start weaning off of her ibuprofen.  We have also discussed again the importance of ambulation and leg elevation.  Deitra Mayo [facility] [phone]

## 2018-05-17 NOTE — Telephone Encounter (Signed)
Pt was called by heartcare and a new pt appointment has been made for pt 06/03/18. Nothing further needed.

## 2018-05-21 DIAGNOSIS — R490 Dysphonia: Secondary | ICD-10-CM | POA: Diagnosis not present

## 2018-05-21 DIAGNOSIS — K219 Gastro-esophageal reflux disease without esophagitis: Secondary | ICD-10-CM | POA: Diagnosis not present

## 2018-05-21 DIAGNOSIS — R1313 Dysphagia, pharyngeal phase: Secondary | ICD-10-CM | POA: Diagnosis not present

## 2018-05-24 ENCOUNTER — Encounter (HOSPITAL_COMMUNITY): Payer: Medicare Other

## 2018-05-24 ENCOUNTER — Encounter: Payer: Medicare Other | Admitting: Vascular Surgery

## 2018-05-24 ENCOUNTER — Encounter

## 2018-05-27 DIAGNOSIS — M545 Low back pain: Secondary | ICD-10-CM | POA: Diagnosis not present

## 2018-05-27 DIAGNOSIS — I872 Venous insufficiency (chronic) (peripheral): Secondary | ICD-10-CM | POA: Diagnosis not present

## 2018-05-27 DIAGNOSIS — F418 Other specified anxiety disorders: Secondary | ICD-10-CM | POA: Diagnosis not present

## 2018-05-27 DIAGNOSIS — E1141 Type 2 diabetes mellitus with diabetic mononeuropathy: Secondary | ICD-10-CM | POA: Diagnosis not present

## 2018-06-07 ENCOUNTER — Encounter: Payer: Self-pay | Admitting: Registered Nurse

## 2018-06-07 ENCOUNTER — Encounter: Payer: Medicare Other | Attending: Physical Medicine & Rehabilitation | Admitting: Registered Nurse

## 2018-06-07 ENCOUNTER — Telehealth: Payer: Self-pay | Admitting: Internal Medicine

## 2018-06-07 VITALS — BP 136/75 | HR 91 | Ht 65.0 in | Wt 175.0 lb

## 2018-06-07 DIAGNOSIS — G039 Meningitis, unspecified: Secondary | ICD-10-CM

## 2018-06-07 DIAGNOSIS — M961 Postlaminectomy syndrome, not elsewhere classified: Secondary | ICD-10-CM | POA: Diagnosis not present

## 2018-06-07 DIAGNOSIS — M5412 Radiculopathy, cervical region: Secondary | ICD-10-CM

## 2018-06-07 DIAGNOSIS — M5416 Radiculopathy, lumbar region: Secondary | ICD-10-CM | POA: Diagnosis not present

## 2018-06-07 DIAGNOSIS — Z79899 Other long term (current) drug therapy: Secondary | ICD-10-CM | POA: Insufficient documentation

## 2018-06-07 DIAGNOSIS — Z5181 Encounter for therapeutic drug level monitoring: Secondary | ICD-10-CM | POA: Insufficient documentation

## 2018-06-07 DIAGNOSIS — M542 Cervicalgia: Secondary | ICD-10-CM | POA: Diagnosis not present

## 2018-06-07 DIAGNOSIS — Z79891 Long term (current) use of opiate analgesic: Secondary | ICD-10-CM | POA: Diagnosis not present

## 2018-06-07 DIAGNOSIS — M5441 Lumbago with sciatica, right side: Secondary | ICD-10-CM | POA: Diagnosis not present

## 2018-06-07 DIAGNOSIS — F329 Major depressive disorder, single episode, unspecified: Secondary | ICD-10-CM | POA: Diagnosis not present

## 2018-06-07 DIAGNOSIS — G894 Chronic pain syndrome: Secondary | ICD-10-CM

## 2018-06-07 DIAGNOSIS — M62838 Other muscle spasm: Secondary | ICD-10-CM

## 2018-06-07 DIAGNOSIS — Z981 Arthrodesis status: Secondary | ICD-10-CM | POA: Diagnosis not present

## 2018-06-07 MED ORDER — OXYCODONE HCL 15 MG PO TABS: 15.0000 mg | ORAL_TABLET | Freq: Four times a day (QID) | ORAL | 0 refills | Status: DC | PRN

## 2018-06-07 MED ORDER — OXYMORPHONE HCL ER 40 MG PO T12A: 40.0000 mg | EXTENDED_RELEASE_TABLET | Freq: Two times a day (BID) | ORAL | 0 refills | Status: DC

## 2018-06-07 NOTE — Progress Notes (Signed)
Subjective:    Patient ID: [name] [name], female    DOB: 1951/04/05, 67 y.o.   MRN: [id number]  HPI: [name] [name] is a 67 y.o. female who returns for follow up appointment for chronic pain and medication refill. She states her pain is located in her neck radiating into her bilateral shoulders and lower back pain radiating into her bilateral lower extremities. She rates her pain 7. Her current exercise regime is walking.  [name] Morphine equivalent is 330.00 MME. She is also prescribed  Diazepam by Dr. Sherrie Sport   .We have discussed the black box warning of using opioids and benzodiazepines. I highlighted the dangers of using these drugs together and discussed the adverse events including respiratory suppression, overdose, cognitive impairment and importance of compliance with current regimen. We will continue to monitor and adjust as indicated.   Last UDS was Performed on 02/14/18, it was consistent.   Pain Inventory Average Pain 7 Pain Right Now 7 My pain is sharp, burning, stabbing and tingling  In the last 24 hours, has pain interfered with the following? General activity 7 Relation with others 7 Enjoyment of life 7 What TIME of day is your pain at its worst? all Sleep (in general) Fair  Pain is worse with: walking, sitting, standing and some activites Pain improves with: rest, heat/ice and medication Relief from Meds: 7  Mobility walk without assistance Do you have any goals in this area?  no  Function Do you have any goals in this area?  no  Neuro/Psych No problems in this area  Prior Studies Any changes since last visit?  no  Physicians involved in your care Any changes since last visit?  no   Family History  Problem Relation Age of Onset  . Hypertension Mother   . Heart disease Mother   . Diabetes Mother   . Cancer Father   . Diabetes Brother    Social History   Socioeconomic History  . Marital status: Divorced    Spouse name: Not on file  . Number  of children: Not on file  . Years of education: Not on file  . Highest education level: Not on file  Occupational History  . Occupation: disable    Employer: DISABLED  Social Needs  . Financial resource strain: Not on file  . Food insecurity:    Worry: Not on file    Inability: Not on file  . Transportation needs:    Medical: Not on file    Non-medical: Not on file  Tobacco Use  . Smoking status: Never Smoker  . Smokeless tobacco: Never Used  Substance and Sexual Activity  . Alcohol use: No  . Drug use: No  . Sexual activity: Not on file    Comment: second hand smoke  Lifestyle  . Physical activity:    Days per week: Not on file    Minutes per session: Not on file  . Stress: Not on file  Relationships  . Social connections:    Talks on phone: Not on file    Gets together: Not on file    Attends religious service: Not on file    Active member of club or organization: Not on file    Attends meetings of clubs or organizations: Not on file    Relationship status: Not on file  Other Topics Concern  . Not on file  Social History Narrative   Has one child   Disabled   Past Surgical History:  Procedure  Laterality Date  . ABDOMINAL HYSTERECTOMY  1987   W/ BSO  . ANTERIOR CERVICAL DECOMP/DISCECTOMY FUSION N/A 08/09/2012   Procedure: ANTERIOR CERVICAL DECOMPRESSION/DISCECTOMY FUSION 1 LEVEL;  Surgeon: Floyce Stakes, MD;  Location: [facility];  Service: Neurosurgery;  Laterality: N/A;  Cervical four-five  Anterior cervical decompression/diskectomy/fusion  . ANTERIOR FUSION CERVICAL SPINE  2007   C5 -6  . APPENDECTOMY    . APPLICATION OF INTRAOPERATIVE CT SCAN N/A 05/17/2017   Procedure: APPLICATION OF INTRAOPERATIVE CT SCAN;  Surgeon: Erline Levine, MD;  Location: [facility];  Service: Neurosurgery;  Laterality: N/A;  . BACK SURGERY     x 5  . BREAST EXCISIONAL BIOPSY Bilateral    No scar seen   . BREAST SURGERY     x 2 biopsies  . CARPAL TUNNEL RELEASE  RIGHT - 2001  &  DEC 2011 W/ BACK SURG.  . CERVICAL DISC SURGERY  2005   C5 - 6  . CHOLECYSTECTOMY  1994  . DORSAL COMPARTMENT RELEASE Right 04/07/2013   Procedure: RIGHT WRIST STS RELEASE;  Surgeon: Schuyler Amor, MD;  Location: [facility];  Service: Orthopedics;  Laterality: Right;  . EXPLORATION OF INCISION FOR CSF LEAK  DEC 2011  X2   POST LAMINECOTMY  . FINGER ARTHRODESIS Right 04/07/2013   Procedure: RIGHT INDEX AND RIGHT LONG DISTAL INTERPHALANGEAL JOINT FUSIONS;  Surgeon: Schuyler Amor, MD;  Location: [place];  Service: Orthopedics;  Laterality: Right;  . FINGER ARTHROPLASTY Right 04/07/2013   Procedure: RIGHT THUMB [place] ARTHROPLASTY;  Surgeon: Schuyler Amor, MD;  Location: [facility];  Service: Orthopedics;  Laterality: Right;  . GANGLION CYST EXCISION Right 04/07/2013   Procedure: RIGHT WRIST MASS EXCISION;  Surgeon: Schuyler Amor, MD;  Location: [place] [facility];  Service: Orthopedics;  Laterality: Right;  . KNEE ARTHROSCOPY  LEFT X3 (LAST ONE 2005)  . KNEE ARTHROSCOPY  05/17/2011   Procedure: ARTHROSCOPY KNEE;  Surgeon: Bradley Ferris III;  Location: [facility];  Service: Orthopedics;  Laterality: Right;  WITH MEDIAL MENISECTOMY AND removal of suprapatella fat lump  . LEFT WRIST TENOSYNECTOMY W/ LEFT THUMB JOINT REPAIR  02-24-10  . LUMBAR FUSION  11-30-10   L4 - 5  . LUMBAR FUSION  2000   L2 - 4  . LUMBAR LAMINECTOMY  DEC 2011   L4 - 5  . POSTERIOR CERVICAL FUSION/FORAMINOTOMY N/A 05/17/2017   Procedure: Cervical five  to Thorastic one  Posterior cervical fusion with lateral mass screws/revision of prior instrumentation;  Surgeon: Erline Levine, MD;  Location: [facility];  Service: Neurosurgery;  Laterality: N/A;  C5 to T1 Posterior cervical fusion with lateral mass screws/revision of prior instrumentation  . TENDON REPAIR  JAN 2010   LEFT INDEX AND LONG FINGERS  . TUBAL LIGATION     Past Medical History:    Diagnosis Date  . Acute sinusitis, unspecified   . Anemia    "quite a few times"  . Anxiety state, unspecified   . Arachnoiditis BILATERAL LEGS   DUE TO MULTIPLE BACK SURG.'S  . Arthritis   . Asthma    last flare up was 03/2017 lasted over a month  . Blood transfusion   . Cancer ([place])    skin cancers (in scalp)  . Cardiomyopathy HX --06/2010   EF was 25% during acute illness (PHELONEPHRITIS) Repeat echo 12-06-10 60% showed normal EF.   Marland Kitchen Chronic back pain greater than 3 months duration  S/P BACK SURG'S  . CSF leak   . Diabetes mellitus without complication ([place])    dx 2013 type 2  . Dyslipidemia   . Dysphagia    some post op cerv fusion 2/14  . Dysrhythmia   . Essential hypertension, benign   . Family history of adverse reaction to anesthesia    mother gets n/v  . GERD (gastroesophageal reflux disease) AND HIATIAL HERNIA   CONTROLLED W/ NEXIUM  . Headache(784.0)   . Heart murmur    DENIES S & S   (ECHO JUN'12 W/ CHART)  . History of chronic bronchitis   . Hx of bladder infections   . Hypertension   . Murmur, heart   . Neuromuscular disorder (HCC)    numbness and tingling  . Osteoporosis   . Other malaise and fatigue   . PONV (postoperative nausea and vomiting)   . Shortness of breath   . Spinal headache   . Spinal stenosis, cervical region   . Varicosities    venous  . Weakness of both legs DUE TO ARACHNOIDITIS   OCCASIONAL USES CANE   BP 136/75   Pulse 91   Ht 5\' 5"  (1.651 m)   Wt 175 lb (79.4 kg)   SpO2 97%   BMI 29.12 kg/m   Opioid Risk Score:   Fall Risk Score:  `1  Depression screen PHQ 2/9  Depression screen [facility] 2/9 03/12/2018 10/25/2017 09/27/2017 07/02/2017 06/04/2017 05/03/2017 03/05/2017  Decreased Interest 0 0 0 1 1 0 0  Down, Depressed, Hopeless 0 0 0 1 1 0 0  PHQ - 2 Score 0 0 0 2 2 0 0  Altered sleeping - - 0 - - - -  Tired, decreased energy - - 0 - - - -  Change in appetite - - 0 - - - -  Feeling bad or failure about yourself  - - 0 -  - - -  Trouble concentrating - - 0 - - - -  Moving slowly or fidgety/restless - - 0 - - - -  Suicidal thoughts - - 0 - - - -  PHQ-9 Score - - 0 - - - -  Some recent data might be hidden     Review of Systems  Constitutional: Negative.   HENT: Negative.   Eyes: Negative.   Respiratory: Negative.   Cardiovascular: Negative.   Gastrointestinal: Negative.   Endocrine: Negative.   Genitourinary: Negative.   Musculoskeletal: Positive for arthralgias and myalgias.  Skin: Negative.   Allergic/Immunologic: Negative.   Neurological: Negative.   Hematological: Negative.   Psychiatric/Behavioral: Negative.   All other systems reviewed and are negative.      Objective:   Physical Exam Vitals signs and nursing note reviewed.  Constitutional:      Appearance: Normal appearance.  Neck:     Musculoskeletal: Normal range of motion and neck supple.     Comments: Cervical Paraspinal Tenderness: C-5-C-6 Cardiovascular:     Rate and Rhythm: Normal rate and regular rhythm.  Pulmonary:     Effort: Pulmonary effort is normal.     Breath sounds: Normal breath sounds.  Musculoskeletal:     Comments: Normal Muscle Bulk and Muscle Testing Reveals:  Upper Extremities: Full ROM and Muscle Strength 5/5 Bilateral AC Joint Tenderness  Thoracic and Lumbar Hypersensitivity  Lower Extremities: Full ROM and Muscle Strength 5/5 Arises from Table with ease using cane for support Narrow Based Gait   Skin:    General: Skin is warm and  dry.  Neurological:     Mental Status: She is alert and oriented to person, place, and time.  Psychiatric:        Mood and Affect: Mood normal.        Behavior: Behavior normal.           Assessment & Plan:  1. On 05/17/2017 :C5C6C7 T1 Posterior Cervical Fusion with lateral mass fixation with AIRO Imaging, revision of prior instrumentation. By Dr. Vertell Limber.  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 Continuecurrent medication  regimen withGabapentin800 mg QID.06/07/2018. Refilled:Oxycodone 15mg  one tablet every 6 hours as needed # 120 and  Oxymorphone HCL 40 mg every 12 hours #60. We will continue the opioid monitoring program, this consists of regular clinic visits, examinations, urine drug screen, pill counts as well as use of [place] Controlled Substance Reporting System. 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 06/07/2018 3. Anxiety/depression: Continue Valium, PCP Following. Continue Cymbalta. Continueto monitor. 06/07/2018 4. Muscle Spasms: Continuecurrent medication regime withFlexeril. Continue to Monitor. 04/07/2018 5. Cervicalgia/ Cervical Radiculitis: Continuecurrent medication regime withGabapentin: Dr. Vertell Limber Following: S/P C5C6C7 T1 Posterior Cervical Fusion with lateral mass fixation with AIRO Imaging, revision of prior instrumentation by Dr. Vertell Limber on 05/17/2017. 06/07/2018 7. Bilateral Thoracic Back Pain: Continue HEP as Tolerated and Continue current medication regimen. Continue to Monitor. 06/07/2018.  8. Left lower extremity DVT/ Phlebitis:  S/P endovenous laser ablation of left great saphenous vein on 05/09/2018 by Dr. Scot Dock:  Vascular  Following  20 minutes of face to face patient care time was spent during this visit. All questions were encouraged and answered.  F/U in 1 month

## 2018-06-07 NOTE — Telephone Encounter (Signed)
Call made to patient, patient states she is having an asthma exacerbation, she has been using Symbicort and Xopenex and her wheezing and SOB is slowly getting worse. She denies fever. She states symptoms started yesterday and she feels very congested and cannot get up the mucous. Patient has increased cough and barely able to speak a complete sentence while talking. Patient declined appointment stating she already has 2 appointments today and MR told her to call anytime she wanted something called in. I did inform patient MR is not here and we would call her back.   TP Please advise as MR is out of the office. Thanks.

## 2018-06-07 NOTE — Telephone Encounter (Signed)
Needs ov asap, can be worked in this evening plenty of openings on the APP schedules .  Ov please  Please contact office for sooner follow up if symptoms do not improve or worsen or seek emergency care

## 2018-06-07 NOTE — Telephone Encounter (Signed)
Patient returned phone call, informed patient that she would need to be seen in the office for a visit. She states she was not feeling well and she lives in [place] and she was not feeling well enough to drive here. I informed patient that she would need to go to the ED if her symptoms worsen or if she was not able to make it into the office. Appointment made for 12.30.19 @ 3:00pm. Nothing further is needed at this time.

## 2018-06-07 NOTE — Telephone Encounter (Signed)
LMTCB

## 2018-06-10 ENCOUNTER — Ambulatory Visit: Payer: Medicare Other | Admitting: Nurse Practitioner

## 2018-06-13 ENCOUNTER — Ambulatory Visit: Payer: Medicare Other | Admitting: Cardiovascular Disease

## 2018-06-14 ENCOUNTER — Ambulatory Visit (INDEPENDENT_AMBULATORY_CARE_PROVIDER_SITE_OTHER): Payer: Medicare Other | Admitting: Nurse Practitioner

## 2018-06-14 ENCOUNTER — Encounter: Payer: Self-pay | Admitting: Nurse Practitioner

## 2018-06-14 ENCOUNTER — Ambulatory Visit (INDEPENDENT_AMBULATORY_CARE_PROVIDER_SITE_OTHER)
Admission: RE | Admit: 2018-06-14 | Discharge: 2018-06-14 | Disposition: A | Discharge status: Home / Self Care | Payer: Medicare Other | Source: Ambulatory Visit | Attending: Nurse Practitioner | Admitting: Nurse Practitioner

## 2018-06-14 VITALS — BP 130/68 | HR 77 | Temp 98.4°F | Ht 65.0 in

## 2018-06-14 DIAGNOSIS — R062 Wheezing: Secondary | ICD-10-CM

## 2018-06-14 DIAGNOSIS — J069 Acute upper respiratory infection, unspecified: Secondary | ICD-10-CM | POA: Diagnosis not present

## 2018-06-14 DIAGNOSIS — R05 Cough: Secondary | ICD-10-CM | POA: Diagnosis not present

## 2018-06-14 MED ORDER — IPRATROPIUM-ALBUTEROL 0.5-2.5 (3) MG/3ML IN SOLN
3.0000 mL | Freq: Once | RESPIRATORY_TRACT | Status: AC
  Administered 2018-06-14: 3 mL via RESPIRATORY_TRACT

## 2018-06-14 MED ORDER — LEVOFLOXACIN 500 MG PO TABS: 500.0000 mg | ORAL_TABLET | Freq: Every day | ORAL | 0 refills | Status: DC

## 2018-06-14 MED ORDER — METHYLPREDNISOLONE ACETATE 80 MG/ML IJ SUSP
80.0000 mg | Freq: Once | INTRAMUSCULAR | Status: AC
  Administered 2018-06-14: 80 mg via INTRAMUSCULAR

## 2018-06-14 MED ORDER — PREDNISONE 10 MG PO TABS: ORAL_TABLET | ORAL | 0 refills | Status: DC

## 2018-06-14 NOTE — Progress Notes (Signed)
@Patient  ID: [name] [name], female    DOB: 08-19-1950, 67 y.o.   MRN: [id number]  Chief Complaint  Patient presents with  . Shortness of Breath    Referring provider: Neale Burly, MD  HPI 67 year old female former smoker with mild persistent asthma and tracheobronchomalacia followed by Dr. Chase Caller   Tests: HRCT 04/12/18 - No findings to suggest interstitial lung disease. Moderate air trapping indicative of small airways disease. 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 Innumerable tiny 1-2 mm pulmonary nodules scattered throughout the lungs bilaterally, stable dating back to 2017, considered definitively benign. Hepatic steatosis.  OV 06/14/18 - cough and sinus congestion Patient presents with nonproductive cough and chest congestion.  Also complains of sinus congestion and pressure.  Symptoms started 2 weeks ago and have progressively worsened.  She has been taking amoxicillin that was prescribed to her over the phone by her PCP.  She states that it has not helped.  States that her O2 sats have remained stable.  She denies any recent fever and was afebrile in office today.  Vital signs are stable today.  Denies any chest pain or edema.    Allergies  Allergen Reactions  . Latex Itching and Rash  . Morphine And Related Hives and Itching  . Aspirin Nausea And Vomiting    Can take coated asa  . Biaxin [Clarithromycin] Nausea And Vomiting  . Clarithromycin Diarrhea  . Codeine Itching  . Darvocet [Propoxyphene N-Acetaminophen] Itching  . Hydrocodone-Acetaminophen Itching  . Lortab [Hydrocodone-Acetaminophen] Itching  . Percocet [Oxycodone-Acetaminophen] Itching  . Tramadol Nausea Only    Immunization History  Administered Date(s) Administered  . Influenza Split 04/19/2013, 03/18/2014  . Influenza, High Dose Seasonal PF 04/25/2018  . Influenza,inj,Quad PF,6+ Mos 05/04/2015, 02/27/2017  . Pneumococcal Polysaccharide-23 04/19/2013  . Zoster 05/30/2013    Past Medical  History:  Diagnosis Date  . Acute sinusitis, unspecified   . Anemia    "quite a few times"  . Anxiety state, unspecified   . Arachnoiditis BILATERAL LEGS   DUE TO MULTIPLE BACK SURG.'S  . Arthritis   . Asthma    last flare up was 03/2017 lasted over a month  . Blood transfusion   . Cancer ([place])    skin cancers (in scalp)  . Cardiomyopathy HX --06/2010   EF was 25% during acute illness (PHELONEPHRITIS) Repeat echo 12-06-10 60% showed normal EF.   Marland Kitchen Chronic back pain greater than 3 months duration    S/P BACK SURG'S  . CSF leak   . Diabetes mellitus without complication ([place])    dx 2013 type 2  . Dyslipidemia   . Dysphagia    some post op cerv fusion 2/14  . Dysrhythmia   . Essential hypertension, benign   . Family history of adverse reaction to anesthesia    mother gets n/v  . GERD (gastroesophageal reflux disease) AND HIATIAL HERNIA   CONTROLLED W/ NEXIUM  . Headache(784.0)   . Heart murmur    DENIES S & S   (ECHO JUN'12 W/ CHART)  . History of chronic bronchitis   . Hx of bladder infections   . Hypertension   . Murmur, heart   . Neuromuscular disorder (HCC)    numbness and tingling  . Osteoporosis   . Other malaise and fatigue   . PONV (postoperative nausea and vomiting)   . Shortness of breath   . Spinal headache   . Spinal stenosis, cervical region   . Varicosities    venous  . Weakness  of both legs DUE TO ARACHNOIDITIS   OCCASIONAL USES CANE    Tobacco History: Social History   Tobacco Use  Smoking Status Never Smoker  Smokeless Tobacco Never Used   Counseling given: Yes   Outpatient Encounter Medications as of 06/14/2018  Medication Sig  . alendronate (FOSAMAX) 70 MG tablet Take 70 mg by mouth every Wednesday. Take with a full glass of water on an empty stomach.   Marland Kitchen aspirin EC 81 MG tablet Take 81 mg by mouth at bedtime.   . budesonide-formoterol (SYMBICORT) 80-4.5 MCG/ACT inhaler Inhale 2 puffs into the lungs 2 (two) times daily.  . Calcium  Carbonate-Vit D-Min 1200-1000 MG-UNIT CHEW Chew 1 tablet by mouth daily.   . diazepam (VALIUM) 10 MG tablet Take 5 mg by mouth See admin instructions.   Marland Kitchen diltiazem (CARDIZEM CD) 240 MG 24 hr capsule Take 240 mg by mouth daily.  . DULoxetine (CYMBALTA) 60 MG capsule Take 1 capsule (60 mg total) by mouth daily.  Marland Kitchen esomeprazole (NEXIUM) 40 MG capsule Take 40 mg by mouth every morning.   Marland Kitchen estradiol (ESTRACE) 2 MG tablet Take 2 mg by mouth daily.   Marland Kitchen gabapentin (NEURONTIN) 800 MG tablet Take 1 tablet (800 mg total) by mouth 4 (four) times daily.  Marland Kitchen JINTELI 1-5 MG-MCG TABS tablet Take 1 tablet by mouth daily.   Marland Kitchen levalbuterol (XOPENEX HFA) 45 MCG/ACT inhaler Inhale 1-2 puffs into the lungs every 4 (four) hours as needed. For shortness of breath  . losartan-hydrochlorothiazide (HYZAAR) 50-12.5 MG tablet Take 1 tablet by mouth daily.  . metFORMIN (GLUCOPHAGE) 500 MG tablet Take 500 mg by mouth 2 (two) times daily with a meal.   . Milk Thistle 200 MG CAPS Take 1 capsule by mouth daily.  . montelukast (SINGULAIR) 10 MG tablet Take 10 mg by mouth at bedtime.  . naloxone (NARCAN) nasal spray 4 mg/0.1 mL Place 1 spray into the nose once as needed (Just in case of Opiod Overdose).  . Omega-3 Fatty Acids (FISH OIL) 1000 MG CAPS Take 1 capsule by mouth 3 (three) times daily.  Marland Kitchen oxyCODONE (ROXICODONE) 15 MG immediate release tablet Take 1 tablet (15 mg total) by mouth every 6 (six) hours as needed. For pain  . Oxymorphone HCl, Crush Resist, 40 MG PO T12A Take 40 mg by mouth 2 (two) times daily.  . simvastatin (ZOCOR) 10 MG tablet Take 10 mg by mouth daily at 6 PM.   . valACYclovir (VALTREX) 500 MG tablet Take 500 mg by mouth daily.   . vitamin B-12 (CYANOCOBALAMIN) 1000 MCG tablet Take 1,000 mcg by mouth daily.   . cyclobenzaprine (FLEXERIL) 10 MG tablet Take 10 mg by mouth See admin instructions. Take 10mg  by mouth twice daily every other week alternating with valium  . levofloxacin (LEVAQUIN) 500 MG tablet  Take 1 tablet (500 mg total) by mouth daily.  . predniSONE (DELTASONE) 10 MG tablet Take 4 tabs for 2 days, then 3 tabs for 2 days, then 2 tabs for 2 days, then 1 tab for 2 days, then stop   Facility-Administered Encounter Medications as of 06/14/2018  Medication  . [COMPLETED] ipratropium-albuterol (DUONEB) 0.5-2.5 (3) MG/3ML nebulizer solution 3 mL  . levalbuterol (XOPENEX) nebulizer solution 0.63 mg  . [COMPLETED] methylPREDNISolone acetate (DEPO-MEDROL) injection 80 mg     Review of Systems  Review of Systems  Constitutional: Negative.  Negative for chills and fever.  HENT: Positive for congestion, postnasal drip, sinus pressure and sinus pain.   Respiratory:  Positive for cough and shortness of breath.   Cardiovascular: Negative.  Negative for chest pain, palpitations and leg swelling.  Gastrointestinal: Negative.   Allergic/Immunologic: Negative.   Neurological: Negative.   Psychiatric/Behavioral: Negative.        Physical Exam  BP 130/68 (BP Location: Left Arm, Patient Position: Sitting, Cuff Size: Normal)   Pulse 77   Temp 98.4 F (36.9 C)   Ht 5\' 5"  (1.651 m)   SpO2 96%   BMI 29.12 kg/m   Wt Readings from Last 5 Encounters:  06/07/18 175 lb (79.4 kg)  05/15/18 189 lb (85.7 kg)  05/09/18 189 lb (85.7 kg)  05/06/18 189 lb (85.7 kg)  05/03/18 194 lb (88 kg)     Physical Exam Vitals signs and nursing note reviewed.  Constitutional:      General: She is not in acute distress.    Appearance: She is well-developed.  Cardiovascular:     Rate and Rhythm: Normal rate and regular rhythm.  Pulmonary:     Effort: Pulmonary effort is normal.     Breath sounds: Normal breath sounds. No decreased breath sounds, wheezing or rhonchi.  Musculoskeletal:     Right lower leg: No edema.  Neurological:     Mental Status: She is alert and oriented to person, place, and time.      Imaging: Dg Chest 2 View  Result Date: 06/14/2018 CLINICAL DATA:  Progressive cough for  2 weeks. Hoarseness. Wheezing. EXAM: CHEST - 2 VIEW COMPARISON:  Chest x-ray dated 08/30/2017 and chest CT dated 04/11/2018 FINDINGS: The heart size and pulmonary vascularity are normal. No effusions. No infiltrates. No acute bone abnormality. Previous surgery in the lumbar spine. Old compression fracture of L1. Previous cervical spine fusion. IMPRESSION: No active cardiopulmonary disease. Electronically Signed   By: Lorriane Shire M.D.   On: 06/14/2018 15:46     Assessment & Plan:   Upper respiratory tract infection Considering symptoms we will treat for sinusitis and bronchitis.  She has not responded well to amoxicillin that was prescribed to her by her PCP.  Will order a different antibiotic.  Patient Instructions  Will order Chest x ray and will call with results Use flutter valve duoneb given in office today DepoMedrol given in office today Will order levaquin Will order prednisone Continue symbicort and xopenex Follow up with Dr. Chase Caller as scheduled or sooner if needed        Fenton Foy, NP 06/17/2018

## 2018-06-14 NOTE — Patient Instructions (Addendum)
Will order Chest x ray and will call with results Use flutter valve duoneb given in office today DepoMedrol given in office today Will order levaquin Will order prednisone Continue symbicort and xopenex Follow up with Dr. Chase Caller as scheduled or sooner if needed

## 2018-06-17 ENCOUNTER — Encounter: Payer: Self-pay | Admitting: Nurse Practitioner

## 2018-06-17 DIAGNOSIS — J069 Acute upper respiratory infection, unspecified: Secondary | ICD-10-CM | POA: Insufficient documentation

## 2018-06-17 NOTE — Assessment & Plan Note (Signed)
Considering symptoms we will treat for sinusitis and bronchitis.  She has not responded well to amoxicillin that was prescribed to her by her PCP.  Will order a different antibiotic.  Patient Instructions  Will order Chest x ray and will call with results Use flutter valve duoneb given in office today DepoMedrol given in office today Will order levaquin Will order prednisone Continue symbicort and xopenex Follow up with Dr. Chase Caller as scheduled or sooner if needed

## 2018-06-18 DIAGNOSIS — E119 Type 2 diabetes mellitus without complications: Secondary | ICD-10-CM | POA: Diagnosis not present

## 2018-06-18 DIAGNOSIS — H25813 Combined forms of age-related cataract, bilateral: Secondary | ICD-10-CM | POA: Diagnosis not present

## 2018-06-18 DIAGNOSIS — Z7984 Long term (current) use of oral hypoglycemic drugs: Secondary | ICD-10-CM | POA: Diagnosis not present

## 2018-06-18 DIAGNOSIS — D3130 Benign neoplasm of unspecified choroid: Secondary | ICD-10-CM | POA: Diagnosis not present

## 2018-06-25 ENCOUNTER — Ambulatory Visit (INDEPENDENT_AMBULATORY_CARE_PROVIDER_SITE_OTHER): Payer: Medicare Other | Admitting: Cardiovascular Disease

## 2018-06-25 ENCOUNTER — Encounter: Payer: Self-pay | Admitting: Cardiovascular Disease

## 2018-06-25 VITALS — BP 134/88 | HR 80 | Ht 65.0 in | Wt 190.0 lb

## 2018-06-25 DIAGNOSIS — I10 Essential (primary) hypertension: Secondary | ICD-10-CM | POA: Diagnosis not present

## 2018-06-25 DIAGNOSIS — I700 Atherosclerosis of aorta: Secondary | ICD-10-CM | POA: Diagnosis not present

## 2018-06-25 NOTE — Patient Instructions (Signed)
Medication Instructions:  NO CHANGES If you need a refill on your cardiac medications before your next appointment, please call your pharmacy.   Lab work: NONE If you have labs (blood work) drawn today and your tests are completely normal, you will receive your results only by: Marland Kitchen MyChart Message (if you have MyChart) OR . A paper copy in the mail If you have any lab test that is abnormal or we need to change your treatment, we will call you to review the results.  Testing/Procedures: NONE  Follow-Up: At [facility], you and your health needs are our priority.  As part of our continuing mission to provide you with exceptional heart care, we have created designated Provider Care Teams.  These Care Teams include your primary Cardiologist (physician) and Advanced Practice Providers (APPs -  Physician Assistants and Nurse Practitioners) who all work together to provide you with the care you need, when you need it. . You may schedule a follow up appointment AS NEEDED.  Please call our office 2 months in advance to schedule this appointment.  You may see Dr. Gwenlyn Found or one of the following Advanced Practice Providers on your designated Care Team:   . Kerin Ransom, [place] . Almyra Deforest, PA-C . Fabian Sharp, PA-C . Jory Sims, DNP . Rosaria Ferries, PA-C . Roby Lofts, PA-C . Sande Rives, PA-C

## 2018-06-25 NOTE — Progress Notes (Signed)
06/25/2018 [name] [name]   03/27/1951  [id number]  Primary Physician Neale Burly, MD Primary Cardiologist: Lorretta Harp MD Garret Reddish, [place], [place]  HPI:  [name] [name] is a 68 y.o. moderately overweight married Caucasian female mother of 1 child, grandmother of 4 grandchildren referred by Dr. Chase Caller for cardiovascular valuation because of aortic atherosclerosis seen on chest CT.  She is retired Statistician.  Risk factors include untreated hypertension and diabetes.  There is no family history of heart disease.  She is never had a heart attack or stroke.  She denies chest pain but does get occasional shortness of breath from reactive airways disease.  She is had venous ablation by Dr. Scot Dock .  A routine chest CT performed in evaluation of nodules revealed aortic atherosclerosis without coronary calcification.   Current Meds  Medication Sig  . alendronate (FOSAMAX) 70 MG tablet Take 70 mg by mouth every Wednesday. Take with a full glass of water on an empty stomach.   Marland Kitchen aspirin EC 81 MG tablet Take 81 mg by mouth at bedtime.   . budesonide-formoterol (SYMBICORT) 80-4.5 MCG/ACT inhaler Inhale 2 puffs into the lungs 2 (two) times daily.  . Calcium Carbonate-Vit D-Min 1200-1000 MG-UNIT CHEW Chew 1 tablet by mouth daily.   . diazepam (VALIUM) 10 MG tablet Take 5 mg by mouth See admin instructions.   Marland Kitchen diltiazem (CARDIZEM CD) 240 MG 24 hr capsule Take 240 mg by mouth daily.  . DULoxetine (CYMBALTA) 60 MG capsule Take 1 capsule (60 mg total) by mouth daily.  Marland Kitchen esomeprazole (NEXIUM) 40 MG capsule Take 40 mg by mouth every morning.   Marland Kitchen estradiol (ESTRACE) 2 MG tablet Take 2 mg by mouth daily.   Marland Kitchen gabapentin (NEURONTIN) 800 MG tablet Take 1 tablet (800 mg total) by mouth 4 (four) times daily.  Marland Kitchen JINTELI 1-5 MG-MCG TABS tablet Take 1 tablet by mouth daily.   Marland Kitchen levalbuterol (XOPENEX HFA) 45 MCG/ACT inhaler Inhale 1-2 puffs into the lungs every 4 (four) hours as needed. For  shortness of breath  . levofloxacin (LEVAQUIN) 500 MG tablet Take 1 tablet (500 mg total) by mouth daily.  . metFORMIN (GLUCOPHAGE) 500 MG tablet Take 500 mg by mouth 2 (two) times daily with a meal.   . Milk Thistle 200 MG CAPS Take 1 capsule by mouth daily.  . montelukast (SINGULAIR) 10 MG tablet Take 10 mg by mouth at bedtime.  . naloxone (NARCAN) nasal spray 4 mg/0.1 mL Place 1 spray into the nose once as needed (Just in case of Opiod Overdose).  . Omega-3 Fatty Acids (FISH OIL) 1000 MG CAPS Take 1 capsule by mouth 3 (three) times daily.  Marland Kitchen oxyCODONE (ROXICODONE) 15 MG immediate release tablet Take 1 tablet (15 mg total) by mouth every 6 (six) hours as needed. For pain  . Oxymorphone HCl, Crush Resist, 40 MG PO T12A Take 40 mg by mouth 2 (two) times daily.  . predniSONE (DELTASONE) 10 MG tablet Take 4 tabs for 2 days, then 3 tabs for 2 days, then 2 tabs for 2 days, then 1 tab for 2 days, then stop  . valACYclovir (VALTREX) 500 MG tablet Take 500 mg by mouth daily.   . vitamin B-12 (CYANOCOBALAMIN) 1000 MCG tablet Take 1,000 mcg by mouth daily.      Allergies  Allergen Reactions  . Latex Itching and Rash  . Morphine And Related Hives and Itching  . Aspirin Nausea And Vomiting  Can take coated asa  . Biaxin [Clarithromycin] Nausea And Vomiting  . Clarithromycin Diarrhea  . Codeine Itching  . Darvocet [Propoxyphene N-Acetaminophen] Itching  . Hydrocodone-Acetaminophen Itching  . Lortab [Hydrocodone-Acetaminophen] Itching  . Percocet [Oxycodone-Acetaminophen] Itching  . Tramadol Nausea Only    Social History   Socioeconomic History  . Marital status: Divorced    Spouse name: Not on file  . Number of children: Not on file  . Years of education: Not on file  . Highest education level: Not on file  Occupational History  . Occupation: disable    Employer: DISABLED  Social Needs  . Financial resource strain: Not on file  . Food insecurity:    Worry: Not on file    Inability:  Not on file  . Transportation needs:    Medical: Not on file    Non-medical: Not on file  Tobacco Use  . Smoking status: Never Smoker  . Smokeless tobacco: Never Used  Substance and Sexual Activity  . Alcohol use: No  . Drug use: No  . Sexual activity: Not on file    Comment: second hand smoke  Lifestyle  . Physical activity:    Days per week: Not on file    Minutes per session: Not on file  . Stress: Not on file  Relationships  . Social connections:    Talks on phone: Not on file    Gets together: Not on file    Attends religious service: Not on file    Active member of club or organization: Not on file    Attends meetings of clubs or organizations: Not on file    Relationship status: Not on file  . Intimate partner violence:    Fear of current or ex partner: Not on file    Emotionally abused: Not on file    Physically abused: Not on file    Forced sexual activity: Not on file  Other Topics Concern  . Not on file  Social History Narrative   Has one child   Disabled     Review of Systems: General: negative for chills, fever, night sweats or weight changes.  Cardiovascular: negative for chest pain, dyspnea on exertion, edema, orthopnea, palpitations, paroxysmal nocturnal dyspnea or shortness of breath Dermatological: negative for rash Respiratory: negative for cough or wheezing Urologic: negative for hematuria Abdominal: negative for nausea, vomiting, diarrhea, bright red blood per rectum, melena, or hematemesis Neurologic: negative for visual changes, syncope, or dizziness All other systems reviewed and are otherwise negative except as noted above.    Blood pressure 134/88, pulse 80, height 5\' 5"  (1.651 m), weight 190 lb (86.2 kg).  General appearance: alert and no distress Neck: no adenopathy, no carotid bruit, no JVD, supple, symmetrical, trachea midline and thyroid not enlarged, symmetric, no tenderness/mass/nodules Lungs: clear to auscultation  bilaterally Heart: regular rate and rhythm, S1, S2 normal, no murmur, click, rub or gallop Extremities: extremities normal, atraumatic, no cyanosis or edema Pulses: 2+ and symmetric Skin: Skin color, texture, turgor normal. No rashes or lesions Neurologic: Alert and oriented X 3, normal strength and tone. Normal symmetric reflexes. Normal coordination and gait  EKG sinus rhythm at 80 without ST or T wave changes.  Personally reviewed this EKG.  ASSESSMENT AND PLAN:   Aortic atherosclerosis ([place]) Ms. Mcclaran was referred to me by Dr. Chase Caller for aortic atherosclerotic sclerosis seen on chest CT performed 04/12/2018.  Interestingly, there was no mention of coronary calcification.  She denies chest pain.  No further  work-up is required at this time.  Hypertension History of essential hypertension her blood pressure measured today 134/88.  She is on diltiazem.      Lorretta Harp MD FACP,FACC,FAHA, [facility] 06/25/2018 2:04 PM

## 2018-06-25 NOTE — Assessment & Plan Note (Signed)
History of essential hypertension her blood pressure measured today 134/88.  She is on diltiazem.

## 2018-06-25 NOTE — Assessment & Plan Note (Signed)
[name] [name] was referred to me by Dr. Chase Caller for aortic atherosclerotic sclerosis seen on chest CT performed 04/12/2018.  Interestingly, there was no mention of coronary calcification.  She denies chest pain.  No further work-up is required at this time.

## 2018-07-02 ENCOUNTER — Ambulatory Visit (INDEPENDENT_AMBULATORY_CARE_PROVIDER_SITE_OTHER): Payer: Medicare Other | Admitting: Adult Health

## 2018-07-02 ENCOUNTER — Encounter: Payer: Self-pay | Admitting: Adult Health

## 2018-07-02 DIAGNOSIS — R053 Chronic cough: Secondary | ICD-10-CM

## 2018-07-02 DIAGNOSIS — R05 Cough: Secondary | ICD-10-CM

## 2018-07-02 DIAGNOSIS — J4541 Moderate persistent asthma with (acute) exacerbation: Secondary | ICD-10-CM | POA: Diagnosis not present

## 2018-07-02 MED ORDER — PREDNISONE 10 MG PO TABS: ORAL_TABLET | ORAL | 0 refills | Status: DC

## 2018-07-02 NOTE — Progress Notes (Signed)
@Patient  ID: [name] [name], female    DOB: 1951-01-10, 68 y.o.   MRN: [id number]  Chief Complaint  Patient presents with  . Acute Visit    asthma     Referring provider: Neale Burly, MD  HPI: 68 year old never smoker female followed for asthma   TEST/EVENTS :  Methacholine challenge test positive Feno 25 ppb 20108   07/02/2018 Acute OV : Asthma  Patient presents for an acute office visit.  She complains of 1 week of dry cough , wheezing . Was exposed to heavy cigarette smoke 1 week ago, since then symptoms have been getting worse.  No discolored mucus .  Says she had Asthmatic bronchitis flare in November that took a few weeks to get over , started to feel better. Chest xray in Dec 2019 was normal .  She is on Symbicort and singulair .  Has increased her xopenex use.  Has been using mucinex cough syrup . Says it is not helping . Has tried some home remedies for cough to try to help .     Allergies  Allergen Reactions  . Latex Itching and Rash  . Morphine And Related Hives and Itching  . Aspirin Nausea And Vomiting    Can take coated asa  . Biaxin [Clarithromycin] Nausea And Vomiting  . Clarithromycin Diarrhea  . Codeine Itching  . Darvocet [Propoxyphene N-Acetaminophen] Itching  . Hydrocodone-Acetaminophen Itching  . Lortab [Hydrocodone-Acetaminophen] Itching  . Percocet [Oxycodone-Acetaminophen] Itching  . Tramadol Nausea Only    Immunization History  Administered Date(s) Administered  . Influenza Split 04/19/2013, 03/18/2014  . Influenza, High Dose Seasonal PF 04/25/2018  . Influenza,inj,Quad PF,6+ Mos 05/04/2015, 02/27/2017  . Pneumococcal Polysaccharide-23 04/19/2013  . Zoster 05/30/2013    Past Medical History:  Diagnosis Date  . Acute sinusitis, unspecified   . Anemia    "quite a few times"  . Anxiety state, unspecified   . Arachnoiditis BILATERAL LEGS   DUE TO MULTIPLE BACK SURG.'S  . Arthritis   . Asthma    last flare up was 03/2017 lasted  over a month  . Blood transfusion   . Cancer ([place])    skin cancers (in scalp)  . Cardiomyopathy HX --06/2010   EF was 25% during acute illness (PHELONEPHRITIS) Repeat echo 12-06-10 60% showed normal EF.   Marland Kitchen Chronic back pain greater than 3 months duration    S/P BACK SURG'S  . CSF leak   . 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 Dyslipidemia   . Dysphagia    some post op cerv fusion 2/14  . Dysrhythmia   . Essential hypertension, benign   . Family history of adverse reaction to anesthesia    mother gets n/v  . GERD (gastroesophageal reflux disease) AND HIATIAL HERNIA   CONTROLLED W/ NEXIUM  . Headache(784.0)   . Heart murmur    DENIES S & S   (ECHO JUN'12 W/ CHART)  . History of chronic bronchitis   . Hx of bladder infections   . Hypertension   . Murmur, heart   . Neuromuscular disorder ([facility])    numbness and tingling  . Osteoporosis   . Other malaise and fatigue   . PONV (postoperative nausea and vomiting)   . Shortness of breath   . Spinal headache   . Spinal stenosis, cervical region   . Varicosities    venous  . Weakness of both legs DUE TO ARACHNOIDITIS   OCCASIONAL USES  CANE    Tobacco History: Social History   Tobacco Use  Smoking Status Never Smoker  Smokeless Tobacco Never Used   Counseling given: Not Answered   Outpatient Medications Prior to Visit  Medication Sig Dispense Refill  . alendronate (FOSAMAX) 70 MG tablet Take 70 mg by mouth every Wednesday. Take with a full glass of water on an empty stomach.     Marland Kitchen aspirin EC 81 MG tablet Take 81 mg by mouth at bedtime.     . budesonide-formoterol (SYMBICORT) 80-4.5 MCG/ACT inhaler Inhale 2 puffs into the lungs 2 (two) times daily. 1 Inhaler 6  . Calcium Carbonate-Vit D-Min 1200-1000 MG-UNIT CHEW Chew 1 tablet by mouth daily.     . diazepam (VALIUM) 10 MG tablet Take 5 mg by mouth See admin instructions.     Marland Kitchen diltiazem (CARDIZEM CD) 240 MG 24 hr capsule Take 240 mg by mouth  daily.    . DULoxetine (CYMBALTA) 60 MG capsule Take 1 capsule (60 mg total) by mouth daily. 30 capsule 3  . esomeprazole (NEXIUM) 40 MG capsule Take 40 mg by mouth every morning.     Marland Kitchen estradiol (ESTRACE) 2 MG tablet Take 2 mg by mouth daily.     Marland Kitchen gabapentin (NEURONTIN) 800 MG tablet Take 1 tablet (800 mg total) by mouth 4 (four) times daily. 120 tablet 5  . JINTELI 1-5 MG-MCG TABS tablet Take 1 tablet by mouth daily.     Marland Kitchen levalbuterol (XOPENEX HFA) 45 MCG/ACT inhaler Inhale 1-2 puffs into the lungs every 4 (four) hours as needed. For shortness of breath 1 Inhaler 6  . levofloxacin (LEVAQUIN) 500 MG tablet Take 1 tablet (500 mg total) by mouth daily. 7 tablet 0  . metFORMIN (GLUCOPHAGE) 500 MG tablet Take 500 mg by mouth 2 (two) times daily with a meal.     . Milk Thistle 200 MG CAPS Take 1 capsule by mouth daily.    . montelukast (SINGULAIR) 10 MG tablet Take 10 mg by mouth at bedtime.    . naloxone (NARCAN) nasal spray 4 mg/0.1 mL Place 1 spray into the nose once as needed (Just in case of Opiod Overdose).    . Omega-3 Fatty Acids (FISH OIL) 1000 MG CAPS Take 1 capsule by mouth 3 (three) times daily.    Marland Kitchen oxyCODONE (ROXICODONE) 15 MG immediate release tablet Take 1 tablet (15 mg total) by mouth every 6 (six) hours as needed. For pain 120 tablet 0  . Oxymorphone HCl, Crush Resist, 40 MG PO T12A Take 40 mg by mouth 2 (two) times daily. 60 tablet 0  . predniSONE (DELTASONE) 10 MG tablet Take 4 tabs for 2 days, then 3 tabs for 2 days, then 2 tabs for 2 days, then 1 tab for 2 days, then stop 20 tablet 0  . valACYclovir (VALTREX) 500 MG tablet Take 500 mg by mouth daily.     . vitamin B-12 (CYANOCOBALAMIN) 1000 MCG tablet Take 1,000 mcg by mouth daily.      Facility-Administered Medications Prior to Visit  Medication Dose Route Frequency Provider Last Rate Last Dose  . levalbuterol (XOPENEX) nebulizer solution 0.63 mg  0.63 mg Nebulization Once Hollyn Stucky S, NP         Review of Systems:     Constitutional:   No  weight loss, night sweats,  Fevers, chills, fatigue, or  lassitude.  HEENT:   No headaches,  Difficulty swallowing,  Tooth/dental problems, or  Sore throat,  No sneezing, itching, ear ache,  +nasal congestion, post nasal drip,   CV:  No chest pain,  Orthopnea, PND, swelling in lower extremities, anasarca, dizziness, palpitations, syncope.   GI  No heartburn, indigestion, abdominal pain, nausea, vomiting, diarrhea, change in bowel habits, loss of appetite, bloody stools.   Resp:  .  No chest wall deformity  Skin: no rash or lesions.  GU: no dysuria, change in color of urine, no urgency or frequency.  No flank pain, no hematuria   MS:  No joint pain or swelling.  No decreased range of motion.  No back pain.    Physical Exam  BP 132/82 (BP Location: Left Arm, Cuff Size: Normal)   Pulse 85   Ht 5\' 5"  (1.651 m)   Wt 196 lb (88.9 kg)   SpO2 95%   BMI 32.62 kg/m   GEN: A/Ox3; pleasant , NAD, elderly    HEENT:  [place]/AT,  EACs-clear, TMs-wnl, NOSE-clear, THROAT-clear, no lesions, no postnasal drip or exudate noted.   NECK:  Supple w/ fair ROM; no JVD; normal carotid impulses w/o bruits; no thyromegaly or nodules palpated; no lymphadenopathy.    RESP  Clear  P & A; w/o, wheezes/ rales/ or rhonchi. no accessory muscle use, no dullness to percussion  CARD:  RRR, no m/r/g, no peripheral edema, pulses intact, no cyanosis or clubbing.  GI:   Soft & nt; nml bowel sounds; no organomegaly or masses detected.   Musco: Warm bil, no deformities or joint swelling noted.   Neuro: alert, no focal deficits noted.    Skin: Warm, no lesions or rashes    Lab Results:  CBC  BMET  Imaging: Dg Chest 2 View  Result Date: 06/14/2018 CLINICAL DATA:  Progressive cough for 2 weeks. Hoarseness. Wheezing. EXAM: CHEST - 2 VIEW COMPARISON:  Chest x-ray dated 08/30/2017 and chest CT dated 04/11/2018 FINDINGS: The heart size and pulmonary vascularity are  normal. No effusions. No infiltrates. No acute bone abnormality. Previous surgery in the lumbar spine. Old compression fracture of L1. Previous cervical spine fusion. IMPRESSION: No active cardiopulmonary disease. Electronically Signed   By: Lorriane Shire M.D.   On: 06/14/2018 15:46    ipratropium-albuterol (DUONEB) 0.5-2.5 (3) MG/3ML nebulizer solution 3 mL    Date Action Dose Route User   06/14/2018 1519 Given 3 mL Nebulization Marylyn Ishihara M, CMA    methylPREDNISolone acetate (DEPO-MEDROL) injection 80 mg    Date Action Dose Route User   06/14/2018 1519 Given 80 mg Intramuscular (Left Deltoid) Nena Polio, CMA      No flowsheet data found.  Lab Results  Component Value Date   NITRICOXIDE 13 03/28/2018        Assessment & Plan:   Moderate persistent asthma with acute exacerbation Flare after cigarette smoke exposure  Short steroid taper  Trigger prevention   Plan  Patient Instructions  Prednisone taper over next week.  Increase Symbicort 160 2 puffs Twice daily  , until sample is gone then back to Symbicort 80 2 puffs Twice daily  .  Mucinex Twice daily  As needed  Cough /congestion  Delsym 2 tsp Twice daily  As needed  Cough .  Hold fish oil and fosamax for 1 month.  Sips of water to soothe throat .  No MINTS .  Saline nasal rinses Twice daily   Follow up with Dr. Chase Caller in 2-3 weeks and As needed   Please contact office for sooner follow up if symptoms do not improve  or worsen or seek emergency care       Chronic cough Cough control regimen   Plan  Patient Instructions  Prednisone taper over next week.  Increase Symbicort 160 2 puffs Twice daily  , until sample is gone then back to Symbicort 80 2 puffs Twice daily  .  Mucinex Twice daily  As needed  Cough /congestion  Delsym 2 tsp Twice daily  As needed  Cough .  Hold fish oil and fosamax for 1 month.  Sips of water to soothe throat .  No MINTS .  Saline nasal rinses Twice daily   Follow up  with Dr. Chase Caller in 2-3 weeks and As needed   Please contact office for sooner follow up if symptoms do not improve or worsen or seek emergency care          Rexene Edison, NP 07/02/2018

## 2018-07-02 NOTE — Patient Instructions (Addendum)
Prednisone taper over next week.  Increase Symbicort 160 2 puffs Twice daily  , until sample is gone then back to Symbicort 80 2 puffs Twice daily  .  Mucinex Twice daily  As needed  Cough /congestion  Delsym 2 tsp Twice daily  As needed  Cough .  Hold fish oil and fosamax for 1 month.  Sips of water to soothe throat .  No MINTS .  Saline nasal rinses Twice daily   Follow up with Dr. Chase Caller in 2-3 weeks and As needed   Please contact office for sooner follow up if symptoms do not improve or worsen or seek emergency care

## 2018-07-02 NOTE — Assessment & Plan Note (Signed)
Cough control regimen   Plan  Patient Instructions  Prednisone taper over next week.  Increase Symbicort 160 2 puffs Twice daily  , until sample is gone then back to Symbicort 80 2 puffs Twice daily  .  Mucinex Twice daily  As needed  Cough /congestion  Delsym 2 tsp Twice daily  As needed  Cough .  Hold fish oil and fosamax for 1 month.  Sips of water to soothe throat .  No MINTS .  Saline nasal rinses Twice daily   Follow up with Dr. Chase Caller in 2-3 weeks and As needed   Please contact office for sooner follow up if symptoms do not improve or worsen or seek emergency care

## 2018-07-02 NOTE — Assessment & Plan Note (Signed)
Flare after cigarette smoke exposure  Short steroid taper  Trigger prevention   Plan  Patient Instructions  Prednisone taper over next week.  Increase Symbicort 160 2 puffs Twice daily  , until sample is gone then back to Symbicort 80 2 puffs Twice daily  .  Mucinex Twice daily  As needed  Cough /congestion  Delsym 2 tsp Twice daily  As needed  Cough .  Hold fish oil and fosamax for 1 month.  Sips of water to soothe throat .  No MINTS .  Saline nasal rinses Twice daily   Follow up with Dr. Chase Caller in 2-3 weeks and As needed   Please contact office for sooner follow up if symptoms do not improve or worsen or seek emergency care

## 2018-07-08 ENCOUNTER — Encounter: Payer: Medicare Other | Admitting: Registered Nurse

## 2018-07-08 ENCOUNTER — Telehealth: Payer: Self-pay | Admitting: Registered Nurse

## 2018-07-08 ENCOUNTER — Other Ambulatory Visit: Payer: Self-pay | Admitting: Registered Nurse

## 2018-07-08 DIAGNOSIS — M961 Postlaminectomy syndrome, not elsewhere classified: Secondary | ICD-10-CM

## 2018-07-08 DIAGNOSIS — G039 Meningitis, unspecified: Secondary | ICD-10-CM

## 2018-07-08 MED ORDER — OXYMORPHONE HCL ER 40 MG PO T12A: 40.0000 mg | EXTENDED_RELEASE_TABLET | Freq: Two times a day (BID) | ORAL | 0 refills | Status: DC

## 2018-07-08 MED ORDER — OXYCODONE HCL 15 MG PO TABS: 15.0000 mg | ORAL_TABLET | Freq: Four times a day (QID) | ORAL | 0 refills | Status: DC | PRN

## 2018-07-08 NOTE — Telephone Encounter (Signed)
Patient arrived today reporting she has pneumonia. Her appointment was changed to next week. PMP was reviewed, Analgesics e-scribed. [name] [name] is aware of the above.

## 2018-07-08 NOTE — Telephone Encounter (Signed)
Pt came into office today but was sick so she was rescheduled

## 2018-07-17 ENCOUNTER — Ambulatory Visit: Payer: Medicare Other | Admitting: Registered Nurse

## 2018-07-17 DIAGNOSIS — E1141 Type 2 diabetes mellitus with diabetic mononeuropathy: Secondary | ICD-10-CM | POA: Diagnosis not present

## 2018-07-17 DIAGNOSIS — I10 Essential (primary) hypertension: Secondary | ICD-10-CM | POA: Diagnosis not present

## 2018-07-17 DIAGNOSIS — M545 Low back pain: Secondary | ICD-10-CM | POA: Diagnosis not present

## 2018-07-18 ENCOUNTER — Encounter: Payer: Medicare Other | Attending: Physical Medicine & Rehabilitation | Admitting: Registered Nurse

## 2018-07-18 ENCOUNTER — Ambulatory Visit (INDEPENDENT_AMBULATORY_CARE_PROVIDER_SITE_OTHER): Payer: Medicare Other | Admitting: Internal Medicine

## 2018-07-18 ENCOUNTER — Encounter: Payer: Self-pay | Admitting: Internal Medicine

## 2018-07-18 ENCOUNTER — Encounter: Payer: Self-pay | Admitting: Registered Nurse

## 2018-07-18 VITALS — BP 95/52 | HR 93 | Ht 65.0 in | Wt 194.0 lb

## 2018-07-18 VITALS — BP 110/68 | HR 95 | Ht 65.0 in | Wt 192.2 lb

## 2018-07-18 DIAGNOSIS — G894 Chronic pain syndrome: Secondary | ICD-10-CM | POA: Diagnosis not present

## 2018-07-18 DIAGNOSIS — Z79891 Long term (current) use of opiate analgesic: Secondary | ICD-10-CM | POA: Diagnosis not present

## 2018-07-18 DIAGNOSIS — Z981 Arthrodesis status: Secondary | ICD-10-CM | POA: Diagnosis not present

## 2018-07-18 DIAGNOSIS — Z5181 Encounter for therapeutic drug level monitoring: Secondary | ICD-10-CM | POA: Insufficient documentation

## 2018-07-18 DIAGNOSIS — R1310 Dysphagia, unspecified: Secondary | ICD-10-CM

## 2018-07-18 DIAGNOSIS — M5412 Radiculopathy, cervical region: Secondary | ICD-10-CM | POA: Diagnosis not present

## 2018-07-18 DIAGNOSIS — M62838 Other muscle spasm: Secondary | ICD-10-CM | POA: Diagnosis not present

## 2018-07-18 DIAGNOSIS — R490 Dysphonia: Secondary | ICD-10-CM

## 2018-07-18 DIAGNOSIS — M961 Postlaminectomy syndrome, not elsewhere classified: Secondary | ICD-10-CM | POA: Insufficient documentation

## 2018-07-18 DIAGNOSIS — G039 Meningitis, unspecified: Secondary | ICD-10-CM | POA: Insufficient documentation

## 2018-07-18 DIAGNOSIS — F418 Other specified anxiety disorders: Secondary | ICD-10-CM

## 2018-07-18 DIAGNOSIS — M542 Cervicalgia: Secondary | ICD-10-CM

## 2018-07-18 DIAGNOSIS — Z79899 Other long term (current) drug therapy: Secondary | ICD-10-CM | POA: Insufficient documentation

## 2018-07-18 DIAGNOSIS — J398 Other specified diseases of upper respiratory tract: Secondary | ICD-10-CM

## 2018-07-18 DIAGNOSIS — M5416 Radiculopathy, lumbar region: Secondary | ICD-10-CM

## 2018-07-18 DIAGNOSIS — J4540 Moderate persistent asthma, uncomplicated: Secondary | ICD-10-CM

## 2018-07-18 DIAGNOSIS — M5441 Lumbago with sciatica, right side: Secondary | ICD-10-CM | POA: Insufficient documentation

## 2018-07-18 LAB — NITRIC OXIDE: Nitric Oxide: 14

## 2018-07-18 NOTE — Addendum Note (Signed)
Addended by: Lorretta Harp on: 07/18/2018 02:45 PM   Modules accepted: Orders

## 2018-07-18 NOTE — Progress Notes (Signed)
OV 04/18/2016  Chief Complaint  Patient presents with  . Follow-up    Pt states voice is raspy. Pt c/o of some congestion more in morning, no tightness, and SOB w/ and w/o excertions. Pt states cough comes and goes.     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 In interim has seen others. [place]p syncope and near cardiac arrest due to dehydratiin per her hx. Since then not the same. Last week having right infrascapular pain that gets worse when she lifts her hand. Also last week or so increased hoarseness of voice is increased cough and wheezing production.She is demanding to have arepeat CT scan of chest for her 38mm nodules.She is extremely worried about cancer.   OV 02/27/2017  Chief Complaint  Patient presents with  . Follow-up    6 month f/u, moderate asthma, been doing ok but started wheezing at night when she lays down, allergies causing congestion    Follow-up asthma based on methacholine challenge test positivity and chronic hoarseness of voice  Last seen October 2017. She says for the last 7-8 months she's been having nocturnal wheezing that is waking up at night. But in the daytime she does not have any wheezing. She is also also having dysphagia that is chronic. She was referred to ENT particularly in the context of having to have C-spine surgery. Apparently her  Vocal cords were inflamed and he was then asked to take double dose of Protonix which seemed to help the cough but not fully. This is frustrating her. She struggling with understanding the pathophysiology of this. She's taking Symbicort and singulair regularly. She has lost 15 pounds of weight in the last few weeks intentionally according to her and so she does not think acid reflux is a problem.    asthma control questionnaire shows that at night she is waking up a few times because of perceived asthma symptoms.  When she wakes up she is asymptomatic and she is only very slightly limited in her activities by asthma. She is only experiencing very little shortness of breath because of asthma. She is wheezing only a little of the time at night but is using a lot of albuterol for rescue. 5 point average score is 1  feno is 25 ppb and normal 02/27/2017  She is asking for =- new issue of pre op clearance prior to C spine surgery next month   OV 08/30/2017   Chief Complaint  Patient presents with  . Follow-up    Pt states she was in the hospital February 13 in [place] due to pna.  Pt does have current complaints of cough, fatigue, hoarseness, and chest tightness. States she is unsure if she is fully over the pna.      Follow-up asthma based on methacholine challenge test positivity and chronic hoarseness of voice  68 year old female follow-up asthma based on methacholine challenge test and chronic hoarseness of voice.  Last seen fall 2018.  After that she tells me that in February 2019 she got hospitalized after suddenly taking ill.  She shows a blood gas results from the outside showing hypoxemia without hypercapnia.  She was not intubated or treated with BiPAP.  She was treated in the medical floor with oxygen and antibiotics.  She says she had hospital-acquired delirium and was diagnosed with right upper lobe pneumonia.  She was then discharged  after a few days.  Since then she is improving slowly but having significant fatigue review of the lab results from [facility] also shows anemia but otherwise chemistries were normal.  I do not have a chest x-ray for my visualization.  Now she tells me that despite being on inhalers for the last 2 days she is having worsening cough that is dry with white mucus no wheezing no orthopnea no proximal nocturnal dyspnea no fever.  She is worried about the cough.     Walking desaturation test on 08/30/2017 185 feet x 3 laps on ROOM AIR:  did not desaturate. Waklked at slow  moderate pace. Rest pulse ox was 99%, final pulse ox was 94%. HR response 96/min at rest to 101/min at peak exertion. Patient [name] [name]  Did not Desaturate < 88% . [name] [name] Pardon yes did  Desaturated </= 3% points. [name] [name] yes did get tachyardic. Got fatigued but only mildly     OV 03/28/2018  Subjective:  Patient ID: [name] [name], female , DOB: 04-Jun-1951 , age 50 y.o. , MRN: [id number] , ADDRESS: [place] [address]   03/28/2018 -   Chief Complaint  Patient presents with  . Follow-up    still not feeling well- retaining fluid in leg - refused weight    Follow-up asthma based on methacholine challenge test positivity and chronic hoarseness of voice  HPI [name] [name] 68 y.o. -presents for routine follow-up.  She tells me that through the summer 2019 she has had 2 course of antibiotics and through a full course of prednisone for respiratory exacerbation.  She says she has chronic shortness of breath with exertion relieved by rest without associated chest pain.  She feels that the course is been progressive.  Moderate in intensity occasionally severe.  In addition she has nocturnal wheezing that is waking her up.  This despite using Xopenex and Entocort which seemed to help her.  In addition for the last 8 months or so she is reporting left lower extremity edema that is worse than the right side.  She has tried compression stockings without relief.  This is new report to me.  She says primary care is not referring her to cardiology.  She thinks that overall she is volume overloaded because of the symptom.  She had a stress test several years ago that was normal.  In addition is also reporting chronic hoarseness of voice that is worse.  Last visit with ENT was with Dr. Redmond Baseman approximately 1 year ago.  She still has not followed up with him.  Exam nitric oxide today is 13 ppb and normal asthma control questionnaire with significant amount of symptomatology.     ROS -  per HPI     OV 07/18/2018  Subjective:  Patient ID: [name] [name], female , DOB: 22-Mar-1951 , age 26 y.o. , MRN: [id number] , ADDRESS: [address] [place] [address]   07/18/2018 -   Chief Complaint  Patient presents with  . Follow-up    Pt states she is still coughing with thick phlegm and is also hoarse due to her husband walking past her smoking a cigarette. Pt also states she is fatigued, dizzy, and has lost her taste and due to that is not eating that much. Pt states she has to use her xopenex inhaler about 4 times daily.   Follow-up asthma based on methacholine challenge test positivity and tracheobronchomalacia on CT OCt 2019 Fpollowup chronic  hoarseness of voice Foollowup chronic benign nodule oct 2019  HPI [name] [name] 68 y.o. -returns for follow-up.  Last seen by myself in October 2019.  After that she has had 3 visits with our nurse practitioners.  Each time for worsening respiratory symptoms and hoarseness of voice.  Each time getting prednisone.  Today she returns and has significant amount of respiratory symptoms.  However she is categorical that these are not changed in the last few weeks of few days in several days.  She says they are all stable.  But the symptom level burden is extremely high.  She is waking up several times at night.  When she wakes up she has mild symptoms.  She is moderately limited in her activities.  She has a little shortness of breath.  Wheezing moderate amount of the time and using albuterol for rescue multiple times daily.  This is despite being on chronic stable inhalers.  Of note, her CT scan in October 2019 showed tracheobronchomalacia.  So we sent her back to Dr. Redmond Baseman in ENT.  She says she has seen him.  I do not have the report with me.  Apparently the vocal cords were red.  Only supportive care advised.  In addition now she is having a new complaint of chronic dysphagia for the last 1 year.  And that she get stuck with mashed potato.   Her last GI evaluation was 10 years ago in [place].  At that time she did not have these current problems.  She is willing to see GI at this point.  She is also open to having a tertiary care referral.     Results for LYNDEL, DANCEL (MRN [id number])  Ref. Range 12/24/2014 16:53 08/30/2017  07/18/2018   Nitric Oxide Unknown 27 Could not perform 14 ppb   IMPRESSION: 1. No findings to suggest interstitial lung disease. 2. Moderate air trapping indicative of small airways disease. 3. 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 4. Innumerable tiny 1-2 mm pulmonary nodules scattered throughout the lungs bilaterally, stable dating back to 2017, considered definitively benign. 5. Hepatic steatosis.   Electronically Signed   By: Vinnie Langton M.D.   On: 04/12/2018 11:09  ROS - per HPI     has a past medical history of Acute sinusitis, unspecified, Anemia, Anxiety state, unspecified, Arachnoiditis (BILATERAL LEGS), Arthritis, Asthma, Blood transfusion, Cancer ([place]), Cardiomyopathy (HX --06/2010), Chronic back pain greater than 3 months duration, CSF leak, Diabetes mellitus without complication ([place]), Dyslipidemia, Dysphagia, Dysrhythmia, Essential hypertension, benign, Family history of adverse reaction to anesthesia, GERD (gastroesophageal reflux disease) (AND HIATIAL HERNIA), Headache(784.0), Heart murmur, History of chronic bronchitis, bladder infections, Hypertension, Murmur, heart, Neuromuscular disorder ([place]), Osteoporosis, Other malaise and fatigue, PONV (postoperative nausea and vomiting), Shortness of breath, Spinal headache, Spinal stenosis, cervical region, Varicosities, and Weakness of both legs (DUE TO ARACHNOIDITIS).   reports that she has never smoked. She has never used smokeless tobacco.  Past Surgical History:  Procedure Laterality Date  . ABDOMINAL HYSTERECTOMY  1987   W/ BSO  . ANTERIOR CERVICAL DECOMP/DISCECTOMY FUSION N/A 08/09/2012   Procedure: ANTERIOR CERVICAL  DECOMPRESSION/DISCECTOMY FUSION 1 LEVEL;  Surgeon: Floyce Stakes, MD;  Location: [facility];  Service: Neurosurgery;  Laterality: N/A;  Cervical four-five  Anterior cervical decompression/diskectomy/fusion  . ANTERIOR FUSION CERVICAL SPINE  2007   C5 -6  . APPENDECTOMY    . APPLICATION OF INTRAOPERATIVE CT SCAN N/A 05/17/2017   Procedure: APPLICATION OF INTRAOPERATIVE CT SCAN;  Surgeon: Erline Levine, MD;  Location: [facility];  Service: Neurosurgery;  Laterality: N/A;  . BACK SURGERY     x 5  . BREAST EXCISIONAL BIOPSY Bilateral    No scar seen   . BREAST SURGERY     x 2 biopsies  . CARPAL TUNNEL RELEASE  RIGHT - 2001  & DEC 2011 W/ BACK SURG.  . CERVICAL DISC SURGERY  2005   C5 - 6  . CHOLECYSTECTOMY  1994  . DORSAL COMPARTMENT RELEASE Right 04/07/2013   Procedure: RIGHT WRIST STS RELEASE;  Surgeon: Schuyler Amor, MD;  Location: [facility];  Service: Orthopedics;  Laterality: Right;  . EXPLORATION OF INCISION FOR CSF LEAK  DEC 2011  X2   POST LAMINECOTMY  . FINGER ARTHRODESIS Right 04/07/2013   Procedure: RIGHT INDEX AND RIGHT LONG DISTAL INTERPHALANGEAL JOINT FUSIONS;  Surgeon: Schuyler Amor, MD;  Location: [facility];  Service: Orthopedics;  Laterality: Right;  . FINGER ARTHROPLASTY Right 04/07/2013   Procedure: RIGHT THUMB [place] ARTHROPLASTY;  Surgeon: Schuyler Amor, MD;  Location: [facility];  Service: Orthopedics;  Laterality: Right;  . GANGLION CYST EXCISION Right 04/07/2013   Procedure: RIGHT WRIST MASS EXCISION;  Surgeon: Schuyler Amor, MD;  Location: [facility];  Service: Orthopedics;  Laterality: Right;  . KNEE ARTHROSCOPY  LEFT X3 (LAST ONE 2005)  . KNEE ARTHROSCOPY  05/17/2011   Procedure: ARTHROSCOPY KNEE;  Surgeon: Bradley Ferris III;  Location: [facility];  Service: Orthopedics;  Laterality: Right;  WITH MEDIAL MENISECTOMY AND removal of suprapatella fat lump  . LEFT WRIST  TENOSYNECTOMY W/ LEFT THUMB JOINT REPAIR  02-24-10  . LUMBAR FUSION  11-30-10   L4 - 5  . LUMBAR FUSION  2000   L2 - 4  . LUMBAR LAMINECTOMY  DEC 2011   L4 - 5  . POSTERIOR CERVICAL FUSION/FORAMINOTOMY N/A 05/17/2017   Procedure: Cervical five  to Thorastic one  Posterior cervical fusion with lateral mass screws/revision of prior instrumentation;  Surgeon: Erline Levine, MD;  Location: [facility];  Service: Neurosurgery;  Laterality: N/A;  C5 to T1 Posterior cervical fusion with lateral mass screws/revision of prior instrumentation  . TENDON REPAIR  JAN 2010   LEFT INDEX AND LONG FINGERS  . TUBAL LIGATION      Allergies  Allergen Reactions  . Latex Itching and Rash  . Morphine And Related Hives and Itching  . Aspirin Nausea And Vomiting    Can take coated asa  . Biaxin [Clarithromycin] Nausea And Vomiting  . Clarithromycin Diarrhea  . Codeine Itching  . Darvocet [Propoxyphene N-Acetaminophen] Itching  . Hydrocodone-Acetaminophen Itching  . Lortab [Hydrocodone-Acetaminophen] Itching  . Percocet [Oxycodone-Acetaminophen] Itching  . Tramadol Nausea Only    Immunization History  Administered Date(s) Administered  . Influenza Split 04/19/2013, 03/18/2014  . Influenza, High Dose Seasonal PF 04/25/2018  . Influenza,inj,Quad PF,6+ Mos 05/04/2015, 02/27/2017  . Pneumococcal Polysaccharide-23 04/19/2013  . Zoster 05/30/2013    Family History  Problem Relation Age of Onset  . Hypertension Mother   . Heart disease Mother   . Diabetes Mother   . Cancer Father   . Diabetes Brother      Current Outpatient Medications:  .  alendronate (FOSAMAX) 70 MG tablet, Take 70 mg by mouth every Wednesday. Take with a full glass of water on an empty stomach. , Disp: , Rfl:  .  aspirin EC 81 MG tablet, Take 81 mg by mouth at bedtime. , Disp: ,  Rfl:  .  budesonide-formoterol (SYMBICORT) 80-4.5 MCG/ACT inhaler, Inhale 2 puffs into the lungs 2 (two) times daily., Disp: 1 Inhaler, Rfl: 6 .  Calcium  Carbonate-Vit D-Min 1200-1000 MG-UNIT CHEW, Chew 1 tablet by mouth daily. , Disp: , Rfl:  .  diazepam (VALIUM) 10 MG tablet, Take 5 mg by mouth See admin instructions. , Disp: , Rfl:  .  diltiazem (CARDIZEM CD) 240 MG 24 hr capsule, Take 240 mg by mouth daily., Disp: , Rfl:  .  DULoxetine (CYMBALTA) 60 MG capsule, Take 1 capsule (60 mg total) by mouth daily., Disp: 30 capsule, Rfl: 3 .  esomeprazole (NEXIUM) 40 MG capsule, Take 40 mg by mouth every morning. , Disp: , Rfl:  .  estradiol (ESTRACE) 2 MG tablet, Take 2 mg by mouth daily. , Disp: , Rfl:  .  gabapentin (NEURONTIN) 800 MG tablet, Take 1 tablet (800 mg total) by mouth 4 (four) times daily., Disp: 120 tablet, Rfl: 5 .  JINTELI 1-5 MG-MCG TABS tablet, Take 1 tablet by mouth daily. , Disp: , Rfl:  .  levalbuterol (XOPENEX HFA) 45 MCG/ACT inhaler, Inhale 1-2 puffs into the lungs every 4 (four) hours as needed. For shortness of breath, Disp: 1 Inhaler, Rfl: 6 .  metFORMIN (GLUCOPHAGE) 500 MG tablet, Take 500 mg by mouth 2 (two) times daily with a meal. , Disp: , Rfl:  .  Milk Thistle 200 MG CAPS, Take 1 capsule by mouth daily., Disp: , Rfl:  .  montelukast (SINGULAIR) 10 MG tablet, Take 10 mg by mouth at bedtime., Disp: , Rfl:  .  naloxone (NARCAN) nasal spray 4 mg/0.1 mL, Place 1 spray into the nose once as needed (Just in case of Opiod Overdose)., Disp: , Rfl:  .  Omega-3 Fatty Acids (FISH OIL) 1000 MG CAPS, Take 1 capsule by mouth 3 (three) times daily., Disp: , Rfl:  .  oxyCODONE (ROXICODONE) 15 MG immediate release tablet, Take 1 tablet (15 mg total) by mouth every 6 (six) hours as needed. For pain, Disp: 120 tablet, Rfl: 0 .  Oxymorphone HCl, Crush Resist, 40 MG PO T12A, Take 40 mg by mouth 2 (two) times daily., Disp: 60 tablet, Rfl: 0 .  valACYclovir (VALTREX) 500 MG tablet, Take 500 mg by mouth daily. , Disp: , Rfl:  .  vitamin B-12 (CYANOCOBALAMIN) 1000 MCG tablet, Take 1,000 mcg by mouth daily. , Disp: , Rfl:  No current  facility-administered medications for this visit.   Facility-Administered Medications Ordered in Other Visits:  .  levalbuterol (XOPENEX) nebulizer solution 0.63 mg, 0.63 mg, Nebulization, Once, Parrett, Tammy S, NP      Objective:   Vitals:   07/18/18 1350  BP: 110/68  Pulse: 95  SpO2: 96%  Weight: 192 lb 3.2 oz (87.2 kg)  Height: 5\' 5"  (1.651 m)    Estimated body mass index is 31.98 kg/m as calculated from the following:   Height as of this encounter: 5\' 5"  (1.651 m).   Weight as of this encounter: 192 lb 3.2 oz (87.2 kg).  @WEIGHTCHANGE @  [employer]   07/18/18 1350  Weight: 192 lb 3.2 oz (87.2 kg)     Physical Exam  General Appearance:    Alert, cooperative, no distress, appears stated age - yes , Deconditioned looking - yes , OBESE  - yes, Sitting on Wheelchair -  no  Head:    Normocephalic, without obvious abnormality, atraumatic  Eyes:    PERRL, conjunctiva/corneas clear,  Ears:    Normal  TM's and external ear canals, both ears  Nose:   Nares normal, septum midline, mucosa normal, no drainage    or sinus tenderness. OXYGEN ON  - ra . Patient is @ ra   Throat:   Lips, mucosa, and tongue normal; teeth and gums normal. Cyanosis on lips - no. HOARSE VOICE +  Neck:   Supple, symmetrical, trachea midline, no adenopathy;    thyroid:  no enlargement/tenderness/nodules; no carotid   bruit or JVD  Back:     Symmetric, no curvature, ROM normal, no CVA tenderness  Lungs:     Distress - no , Wheeze non, Barrell Chest - ono, Purse lip breathing - no, Crackles - no   Chest Wall:    No tenderness or deformity.    Heart:    Regular rate and rhythm, S1 and S2 normal, no rub   or gallop, Murmur - no  Breast Exam:    NOT DONE  Abdomen:     Soft, non-tender, bowel sounds active all four quadrants,    no masses, no organomegaly. Visceral obesity - no  Genitalia:   NOT DONE  Rectal:   NOT DONE  Extremities:   Extremities - normal, Has Cane - no, Clubbing - no, Edema - no    Pulses:   2+ and symmetric all extremities  Skin:   Stigmata of Connective Tissue Disease - noo  Lymph nodes:   Cervical, supraclavicular, and axillary nodes normal  Psychiatric:  Neurologic:   Pleasant - yes, Anxious - no, Flat affect - YES okay thank youf   CAm-ICU - neg, Alert and Oriented x 3 - yes, Moves all 4s - yes, Speech - normal, Cognition - intact           Assessment:       ICD-10-CM   1. Dysphagia, unspecified type R13.10   2. Moderate persistent asthma without complication C16.60   3. 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 Hoarseness of voice R49.0        Plan:     Patient Instructions  Dysphagia, unspecified type Hoarse voice  - refer GI - [place]. Dr Juanita Craver  or [facility]  - follow advice of Dr Salli Quarry  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 - continue inhalers   Followup 3 months or sooner ; consider [facility] eval based on [facility] eval results   > 50% of this > 25 min visit spent in face to face counseling or coordination of care - by this undersigned MD - Dr Brand Males. This includes one or more of the following documented above: discussion of test results, diagnostic or treatment recommendations, prognosis, risks and benefits of management options, instructions, education, compliance or risk-factor reduction    SIGNATURE    Dr. Brand Males, M.D., F.C.C.P,  Pulmonary and Critical Care Medicine Staff Physician, [place] Director - Interstitial Lung Disease  Program  Pulmonary [place] [facility] [place], [place], [address]  Pager: [phone], If no answer or between  15:00h - 7:00h: call [phone] Telephone: [phone]  2:28 PM 07/18/2018

## 2018-07-18 NOTE — Patient Instructions (Addendum)
Dysphagia, unspecified type Hoarse voice  - refer GI - [place]. Dr Juanita Craver  or [facility]  - follow advice of Dr Salli Quarry  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 - continue inhalers   Followup 3 months or sooner ; consider [facility] based on [facility] eval results

## 2018-07-18 NOTE — Progress Notes (Signed)
Subjective:    Patient ID: [name] [name], female    DOB: [birth year], 68 y.o.   MRN: [id number]  HPI: [name] [name] is a 68 y.o. female who returns for follow up appointment for chronic pain and medication refill. She states her pain is located in her neck radiating into her bilateral shoulders, lower back pain radiating into her left lower extremity. [name] [name] reports on 07/17/2018 she was hanging a shower curtain, she states her right leg wouldn't move and it lasted for a few seconds. She denies falling. She was instructed to keep a pain journal and reports increase intensity of back pain, she will be calling Dr. Vertell [name] for follow up appointment she states.  She rates her pain 8. Her current exercise regime is walking and performing stretching exercises.  [name] [name] Morphine equivalent is 330.00 MME.She is also prescribed Diazepam by Dr. Sherrie [name]. We have discussed the black box warning of using opioids and benzodiazepines. I highlighted the dangers of using these drugs together and discussed the adverse events including respiratory suppression, overdose, cognitive impairment and importance of compliance with current regimen. We will continue to monitor and adjust as indicated.   Pain Inventory Average Pain 8 Pain Right Now 8 My pain is sharp, burning, stabbing, tingling and aching  In the last 24 hours, has pain interfered with the following? General activity 7 Relation with others 7 Enjoyment of life 7 What TIME of day is your pain at its worst? all Sleep (in general) Fair  Pain is worse with: walking, bending, sitting, standing and some activites Pain improves with: rest, heat/ice and medication Relief from Meds: 7  Mobility Do you have any goals in this area?  no  Function Do you have any goals in this area?  no  Neuro/Psych weakness  Prior Studies Any changes since last visit?  no  Physicians involved in your care Any changes since last visit?  no   Family History    Problem Relation Age of Onset  . Hypertension Mother   . Heart disease Mother   . Diabetes Mother   . Cancer Father   . Diabetes Brother    Social History   Socioeconomic History  . Marital status: Divorced    Spouse name: Not on file  . Number of children: Not on file  . Years of education: Not on file  . Highest education level: Not on file  Occupational History  . Occupation: disable    Employer: DISABLED  Social Needs  . Financial resource strain: Not on file  . Food insecurity:    Worry: Not on file    Inability: Not on file  . Transportation needs:    Medical: Not on file    Non-medical: Not on file  Tobacco Use  . Smoking status: Never Smoker  . Smokeless tobacco: Never Used  Substance and Sexual Activity  . Alcohol use: No  . Drug use: No  . Sexual activity: Not on file    Comment: second hand smoke  Lifestyle  . Physical activity:    Days per week: Not on file    Minutes per session: Not on file  . Stress: Not on file  Relationships  . Social connections:    Talks on phone: Not on file    Gets together: Not on file    Attends religious service: Not on file    Active member of club or organization: Not on file    Attends meetings of clubs or  organizations: Not on file    Relationship status: Not on file  Other Topics Concern  . Not on file  Social History Narrative   Has one child   Disabled   Past Surgical History:  Procedure Laterality Date  . ABDOMINAL HYSTERECTOMY  1987   W/ BSO  . ANTERIOR CERVICAL DECOMP/DISCECTOMY FUSION N/A 08/09/2012   Procedure: ANTERIOR CERVICAL DECOMPRESSION/DISCECTOMY FUSION 1 LEVEL;  Surgeon: [name] Stakes, MD;  Location: [facility];  Service: Neurosurgery;  Laterality: N/A;  Cervical four-five  Anterior cervical decompression/diskectomy/fusion  . ANTERIOR FUSION CERVICAL SPINE  2007   C5 -6  . APPENDECTOMY    . APPLICATION OF INTRAOPERATIVE CT SCAN N/A 05/17/2017   Procedure: APPLICATION OF INTRAOPERATIVE CT  SCAN;  Surgeon: [name] Levine, MD;  Location: [place];  Service: Neurosurgery;  Laterality: N/A;  . BACK SURGERY     x 5  . BREAST EXCISIONAL BIOPSY Bilateral    No scar seen   . BREAST SURGERY     x 2 biopsies  . CARPAL TUNNEL RELEASE  RIGHT - 2001  & DEC 2011 W/ BACK SURG.  . CERVICAL DISC SURGERY  2005   C5 - 6  . CHOLECYSTECTOMY  1994  . DORSAL COMPARTMENT RELEASE Right 04/07/2013   Procedure: RIGHT WRIST STS RELEASE;  Surgeon: [name] Amor, MD;  Location: [place];  Service: Orthopedics;  Laterality: Right;  . EXPLORATION OF INCISION FOR CSF LEAK  DEC 2011  X2   POST LAMINECOTMY  . FINGER ARTHRODESIS Right 04/07/2013   Procedure: RIGHT INDEX AND RIGHT LONG DISTAL INTERPHALANGEAL JOINT FUSIONS;  Surgeon: [name] Amor, MD;  Location: [place];  Service: Orthopedics;  Laterality: Right;  . FINGER ARTHROPLASTY Right 04/07/2013   Procedure: RIGHT THUMB [place] ARTHROPLASTY;  Surgeon: [name] Amor, MD;  Location: [place];  Service: Orthopedics;  Laterality: Right;  . GANGLION CYST EXCISION Right 04/07/2013   Procedure: RIGHT WRIST MASS EXCISION;  Surgeon: [name] Amor, MD;  Location: [place];  Service: Orthopedics;  Laterality: Right;  . KNEE ARTHROSCOPY  LEFT X3 (LAST ONE 2005)  . KNEE ARTHROSCOPY  05/17/2011   Procedure: ARTHROSCOPY KNEE;  Surgeon: [name] [name];  Location: [place];  Service: Orthopedics;  Laterality: Right;  WITH MEDIAL MENISECTOMY AND removal of suprapatella fat lump  . LEFT WRIST TENOSYNECTOMY W/ LEFT THUMB JOINT REPAIR  02-24-10  . LUMBAR FUSION  11-30-10   L4 - 5  . LUMBAR FUSION  2000   L2 - 4  . LUMBAR LAMINECTOMY  DEC 2011   L4 - 5  . POSTERIOR CERVICAL FUSION/FORAMINOTOMY N/A 05/17/2017   Procedure: Cervical five  to Thorastic one  Posterior cervical fusion with lateral mass screws/revision of prior instrumentation;  Surgeon: [name] Levine, MD;   Location: [place];  Service: Neurosurgery;  Laterality: N/A;  C5 to T1 Posterior cervical fusion with lateral mass screws/revision of prior instrumentation  . TENDON REPAIR  JAN 2010   LEFT INDEX AND LONG FINGERS  . TUBAL LIGATION     Past Medical History:  Diagnosis Date  . Acute sinusitis, unspecified   . Anemia    "quite a few times"  . Anxiety state, unspecified   . Arachnoiditis BILATERAL LEGS   DUE TO MULTIPLE BACK SURG.'S  . Arthritis   . Asthma    last flare up was 03/2017 lasted over a month  . Blood transfusion   . Cancer ([place])  skin cancers (in scalp)  . Cardiomyopathy HX --06/2010   EF was 25% during acute illness (PHELONEPHRITIS) Repeat echo 12-06-10 60% showed normal EF.   Marland Kitchen Chronic back pain greater than 3 months duration    S/P BACK SURG'S  . CSF leak   . Diabetes mellitus without complication ([place])    dx 2013 type 2  . Dyslipidemia   . Dysphagia    some post op cerv fusion 2/14  . Dysrhythmia   . Essential hypertension, benign   . Family history of adverse reaction to anesthesia    mother gets n/v  . GERD (gastroesophageal reflux disease) AND HIATIAL HERNIA   CONTROLLED W/ NEXIUM  . Headache(784.0)   . Heart murmur    DENIES S & S   (ECHO JUN'12 W/ CHART)  . History of chronic bronchitis   . Hx of bladder infections   . Hypertension   . Murmur, heart   . Neuromuscular disorder (HCC)    numbness and tingling  . Osteoporosis   . Other malaise and fatigue   . PONV (postoperative nausea and vomiting)   . Shortness of breath   . Spinal headache   . Spinal stenosis, cervical region   . Varicosities    venous  . Weakness of both legs DUE TO ARACHNOIDITIS   OCCASIONAL USES CANE   There were no vitals taken for this visit.  Opioid Risk Score:   Fall Risk Score:  `1  Depression screen PHQ 2/9  Depression screen [facility] 2/9 03/12/2018 10/25/2017 09/27/2017 07/02/2017 06/04/2017 05/03/2017 03/05/2017  Decreased Interest 0 0 0 1 1 0 0  Down, Depressed,  Hopeless 0 0 0 1 1 0 0  PHQ - 2 Score 0 0 0 2 2 0 0  Altered sleeping - - 0 - - - -  Tired, decreased energy - - 0 - - - -  Change in appetite - - 0 - - - -  Feeling bad or failure about yourself  - - 0 - - - -  Trouble concentrating - - 0 - - - -  Moving slowly or fidgety/restless - - 0 - - - -  Suicidal thoughts - - 0 - - - -  PHQ-9 Score - - 0 - - - -  Some recent data might be hidden     Review of Systems  Constitutional: Negative.   HENT: Negative.   Eyes: Negative.   Respiratory: Negative.   Cardiovascular: Negative.   Gastrointestinal: Negative.   Endocrine: Negative.   Genitourinary: Negative.   Musculoskeletal: Positive for arthralgias, gait problem and myalgias.  Skin: Negative.   Allergic/Immunologic: Negative.   Neurological: Positive for weakness.  Hematological: Negative.   Psychiatric/Behavioral: Negative.   All other systems reviewed and are negative.      Objective:   Physical Exam Vitals signs and nursing note reviewed.  Constitutional:      Appearance: Normal appearance.  Neck:     Musculoskeletal: Normal range of motion and neck supple.     Comments: Cervical Paraspinal Tenderness: C-5-C-6 Cardiovascular:     Rate and Rhythm: Normal rate and regular rhythm.     Pulses: Normal pulses.     Heart sounds: Normal heart sounds.  Pulmonary:     Effort: Pulmonary effort is normal.     Breath sounds: Normal breath sounds.  Musculoskeletal:     Comments: Normal Muscle Bulk and Muscle Testing Reveals:  Upper Extremities: Full ROM and Muscle Strength 5/5 Bilateral AC Joint Tenderness Lumbar  Hypersensitivity Lower Extremities: Right Decreased ROM and Muscle Strength 5/5 Left: Full ROM and Muscle Strength 5/5 Arises from Table slowly using cane for support Narrow Based Gait   Skin:    General: Skin is warm and dry.  Neurological:     Mental Status: She is alert and oriented to person, place, and time.  Psychiatric:        Mood and Affect: Mood  normal.        Behavior: Behavior normal.           Assessment & Plan:  1. On 05/17/2017 :C5C6C7 T1 Posterior Cervical Fusion with lateral mass fixation with AIRO Imaging, revision of prior instrumentation. By Dr. Vertell [name].  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 Continuecurrent medication regimen withGabapentin800 mg QID.07/18/2018. Refilled:Oxycodone 15mg  one tablet every 6 hours as needed # 120 and Oxymorphone HCL 40 mg every 12 hours #60. We will continue the opioid monitoring program, this consists of regular clinic visits, examinations, urine drug screen, pill counts as well as use of [place] Controlled Substance Reporting System. 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 07/18/2018. 3. Anxiety/depression: Continue Valium, PCP Following. ContinueCymbalta. Continueto monitor. 07/18/2018 4. Muscle Spasms: Continuecurrent medication regime withFlexeril. Continue to Monitor. 07/18/2018. 5. Cervicalgia/ Cervical Radiculitis: Continuecurrent medication regime withGabapentin: Dr. Vertell [name] Following: S/P C5C6C7 T1 Posterior Cervical Fusion with lateral mass fixation with AIRO Imaging, revision of prior instrumentation by Dr. Vertell [name] on 05/17/2017. 07/18/2018. 7. Bilateral Thoracic Back Pain: Continue HEP as Tolerated and Continue current medication regimen. Continue to Monitor.07/18/2018. 8. Left lower extremity DVT/ Phlebitis: S/P endovenous laser ablation of left great saphenous vein on 05/09/2018 by Dr. Scot Dock:  Vascular  Following. 07/18/2018  20 minutes of face to face patient care time was spent during this visit. All questions were encouraged and answered.  F/U in 1 month

## 2018-07-18 NOTE — Addendum Note (Signed)
Addended by: Lorretta Harp on: 07/18/2018 02:49 PM   Modules accepted: Orders

## 2018-07-24 LAB — TOXASSURE SELECT,+ANTIDEPR,UR

## 2018-07-25 ENCOUNTER — Telehealth: Payer: Self-pay | Admitting: *Deleted

## 2018-07-25 ENCOUNTER — Encounter: Payer: Self-pay | Admitting: Gastroenterology

## 2018-07-25 NOTE — Telephone Encounter (Signed)
Urine drug screen for this encounter is consistent for prescribed medication 

## 2018-07-26 ENCOUNTER — Ambulatory Visit: Payer: Medicare Other | Admitting: Internal Medicine

## 2018-08-06 ENCOUNTER — Telehealth: Payer: Self-pay | Admitting: Registered Nurse

## 2018-08-06 DIAGNOSIS — M961 Postlaminectomy syndrome, not elsewhere classified: Secondary | ICD-10-CM

## 2018-08-06 DIAGNOSIS — G039 Meningitis, unspecified: Secondary | ICD-10-CM

## 2018-08-06 MED ORDER — OXYCODONE HCL 15 MG PO TABS: 15.0000 mg | ORAL_TABLET | Freq: Four times a day (QID) | ORAL | 0 refills | Status: DC | PRN

## 2018-08-06 MED ORDER — OXYMORPHONE HCL ER 40 MG PO T12A: 40.0000 mg | EXTENDED_RELEASE_TABLET | Freq: Two times a day (BID) | ORAL | 0 refills | Status: DC

## 2018-08-06 NOTE — Telephone Encounter (Signed)
Ms. Pinera analgesics prescribed, My chart message sent. PMP was reviewed.

## 2018-08-07 ENCOUNTER — Other Ambulatory Visit: Payer: Self-pay | Admitting: Physical Medicine & Rehabilitation

## 2018-08-07 DIAGNOSIS — F418 Other specified anxiety disorders: Secondary | ICD-10-CM

## 2018-08-14 ENCOUNTER — Encounter: Payer: Self-pay | Admitting: Registered Nurse

## 2018-08-14 ENCOUNTER — Encounter: Payer: Self-pay | Admitting: Vascular Surgery

## 2018-08-14 ENCOUNTER — Ambulatory Visit (INDEPENDENT_AMBULATORY_CARE_PROVIDER_SITE_OTHER): Payer: Medicare Other | Admitting: Vascular Surgery

## 2018-08-14 ENCOUNTER — Other Ambulatory Visit: Payer: Self-pay

## 2018-08-14 ENCOUNTER — Encounter: Payer: Medicare Other | Attending: Physical Medicine & Rehabilitation | Admitting: Registered Nurse

## 2018-08-14 VITALS — BP 124/80 | HR 82 | Ht 65.0 in | Wt 201.0 lb

## 2018-08-14 VITALS — BP 112/73 | HR 94 | Resp 18 | Ht 65.0 in | Wt 201.0 lb

## 2018-08-14 DIAGNOSIS — G894 Chronic pain syndrome: Secondary | ICD-10-CM | POA: Diagnosis not present

## 2018-08-14 DIAGNOSIS — F418 Other specified anxiety disorders: Secondary | ICD-10-CM

## 2018-08-14 DIAGNOSIS — Z79899 Other long term (current) drug therapy: Secondary | ICD-10-CM | POA: Insufficient documentation

## 2018-08-14 DIAGNOSIS — M961 Postlaminectomy syndrome, not elsewhere classified: Secondary | ICD-10-CM | POA: Insufficient documentation

## 2018-08-14 DIAGNOSIS — M5416 Radiculopathy, lumbar region: Secondary | ICD-10-CM

## 2018-08-14 DIAGNOSIS — I83812 Varicose veins of left lower extremities with pain: Secondary | ICD-10-CM | POA: Diagnosis not present

## 2018-08-14 DIAGNOSIS — G039 Meningitis, unspecified: Secondary | ICD-10-CM | POA: Diagnosis not present

## 2018-08-14 DIAGNOSIS — M5412 Radiculopathy, cervical region: Secondary | ICD-10-CM

## 2018-08-14 DIAGNOSIS — Z79891 Long term (current) use of opiate analgesic: Secondary | ICD-10-CM

## 2018-08-14 DIAGNOSIS — Z5181 Encounter for therapeutic drug level monitoring: Secondary | ICD-10-CM | POA: Diagnosis not present

## 2018-08-14 DIAGNOSIS — M542 Cervicalgia: Secondary | ICD-10-CM | POA: Diagnosis not present

## 2018-08-14 DIAGNOSIS — M62838 Other muscle spasm: Secondary | ICD-10-CM | POA: Diagnosis not present

## 2018-08-14 DIAGNOSIS — M5441 Lumbago with sciatica, right side: Secondary | ICD-10-CM | POA: Insufficient documentation

## 2018-08-14 DIAGNOSIS — Z981 Arthrodesis status: Secondary | ICD-10-CM

## 2018-08-14 NOTE — Progress Notes (Signed)
Patient name: [name] [name] MRN: [id number] DOB: 1951-05-31 Sex: female  REASON FOR VISIT:   6-month follow-up visit.  HPI:   [name] [name] is a pleasant 68 y.o. female who I last saw on 05/15/2018.  She underwent endovenous laser ablation of the left great saphenous vein on 05/09/2018.  For 1 week follow-up duplex scan showed successful ablation of the great saphenous vein with no evidence of DVT.  She had some spider veins in the left leg and returns for a 48-month follow-up visit.  I felt that if these continue to be a problem she could be considered for sclerotherapy.  Her symptoms improved significantly after her laser ablation procedure.  She continues to get some occasional itching in her spider veins around her left ankle.  Her main concern is that she had a severe bleeding episode in September 2019 when she was shaving her legs and lost considerable blood in her bathroom.  This was at the site of her spider veins just above her ankle.  Past Medical History:  Diagnosis Date  . Acute sinusitis, unspecified   . Anemia    "quite a few times"  . Anxiety state, unspecified   . Arachnoiditis BILATERAL LEGS   DUE TO MULTIPLE BACK SURG.'S  . Arthritis   . Asthma    last flare up was 03/2017 lasted over a month  . Blood transfusion   . Cancer ([place])    skin cancers (in scalp)  . Cardiomyopathy HX --06/2010   EF was 25% during acute illness (PHELONEPHRITIS) Repeat echo 12-06-10 60% showed normal EF.   Marland Kitchen Chronic back pain greater than 3 months duration    S/P BACK SURG'S  . CSF leak   . Diabetes mellitus without complication ([place])    dx 2013 type 2  . Dyslipidemia   . Dysphagia    some post op cerv fusion 2/14  . Dysrhythmia   . Essential hypertension, benign   . Family history of adverse reaction to anesthesia    mother gets n/v  . GERD (gastroesophageal reflux disease) AND HIATIAL HERNIA   CONTROLLED W/ NEXIUM  . Headache(784.0)   . Heart murmur    DENIES S & S   (ECHO  JUN'12 W/ CHART)  . History of chronic bronchitis   . Hx of bladder infections   . Hypertension   . Murmur, heart   . Neuromuscular disorder (HCC)    numbness and tingling  . Osteoporosis   . Other malaise and fatigue   . PONV (postoperative nausea and vomiting)   . Shortness of breath   . Spinal headache   . Spinal stenosis, cervical region   . Varicosities    venous  . Weakness of both legs DUE TO ARACHNOIDITIS   OCCASIONAL USES CANE    Family History  Problem Relation Age of Onset  . Hypertension Mother   . Heart disease Mother   . Diabetes Mother   . Cancer Father   . Diabetes Brother     SOCIAL HISTORY: Social History   Tobacco Use  . Smoking status: Never Smoker  . Smokeless tobacco: Never Used  Substance Use Topics  . Alcohol use: No    Allergies  Allergen Reactions  . Latex Itching and Rash  . Morphine And Related Hives and Itching  . Aspirin Nausea And Vomiting    Can take coated asa  . Biaxin [Clarithromycin] Nausea And Vomiting  . Clarithromycin Diarrhea  . Codeine Itching  . Darvocet [Propoxyphene  N-Acetaminophen] Itching  . Hydrocodone-Acetaminophen Itching  . Lortab [Hydrocodone-Acetaminophen] Itching  . Percocet [Oxycodone-Acetaminophen] Itching  . Tramadol Nausea Only    Current Outpatient Medications  Medication Sig Dispense Refill  . alendronate (FOSAMAX) 70 MG tablet Take 70 mg by mouth every Wednesday. Take with a full glass of water on an empty stomach.     Marland Kitchen aspirin EC 81 MG tablet Take 81 mg by mouth at bedtime.     . budesonide-formoterol (SYMBICORT) 80-4.5 MCG/ACT inhaler Inhale 2 puffs into the lungs 2 (two) times daily. 1 Inhaler 6  . Calcium Carbonate-Vit D-Min 1200-1000 MG-UNIT CHEW Chew 1 tablet by mouth daily.     . diazepam (VALIUM) 10 MG tablet Take 5 mg by mouth See admin instructions.     Marland Kitchen diltiazem (CARDIZEM CD) 240 MG 24 hr capsule Take 240 mg by mouth daily.    . DULoxetine (CYMBALTA) 60 MG capsule TAKE ONE CAPSULE  BY MOUTH DAILY 30 capsule 3  . esomeprazole (NEXIUM) 40 MG capsule Take 40 mg by mouth every morning.     Marland Kitchen estradiol (ESTRACE) 2 MG tablet Take 2 mg by mouth daily.     Marland Kitchen gabapentin (NEURONTIN) 800 MG tablet Take 1 tablet (800 mg total) by mouth 4 (four) times daily. 120 tablet 5  . JINTELI 1-5 MG-MCG TABS tablet Take 1 tablet by mouth daily.     Marland Kitchen levalbuterol (XOPENEX HFA) 45 MCG/ACT inhaler Inhale 1-2 puffs into the lungs every 4 (four) hours as needed. For shortness of breath 1 Inhaler 6  . metFORMIN (GLUCOPHAGE) 500 MG tablet Take 500 mg by mouth 2 (two) times daily with a meal.     . Milk Thistle 200 MG CAPS Take 1 capsule by mouth daily.    . montelukast (SINGULAIR) 10 MG tablet Take 10 mg by mouth at bedtime.    . naloxone (NARCAN) nasal spray 4 mg/0.1 mL Place 1 spray into the nose once as needed (Just in case of Opiod Overdose).    . Omega-3 Fatty Acids (FISH OIL) 1000 MG CAPS Take 1 capsule by mouth 3 (three) times daily.    Marland Kitchen oxyCODONE (ROXICODONE) 15 MG immediate release tablet Take 1 tablet (15 mg total) by mouth every 6 (six) hours as needed. For pain 120 tablet 0  . Oxymorphone HCl, Crush Resist, 40 MG PO T12A Take 40 mg by mouth 2 (two) times daily. 60 tablet 0  . valACYclovir (VALTREX) 500 MG tablet Take 500 mg by mouth daily.     . vitamin B-12 (CYANOCOBALAMIN) 1000 MCG tablet Take 1,000 mcg by mouth daily.      No current facility-administered medications for this visit.    Facility-Administered Medications Ordered in Other Visits  Medication Dose Route Frequency Provider Last Rate Last Dose  . levalbuterol (XOPENEX) nebulizer solution 0.63 mg  0.63 mg Nebulization Once Parrett, Tammy S, NP        REVIEW OF SYSTEMS:  [X]  denotes positive finding, [ ]  denotes negative finding Cardiac  Comments:  Chest pain or chest pressure:    Shortness of breath upon exertion:    Short of breath when lying flat:    Irregular heart rhythm:        Vascular    Pain in calf, thigh,  or hip brought on by ambulation:    Pain in feet at night that wakes you up from your sleep:     Blood clot in your veins:    Leg swelling:  Pulmonary    Oxygen at home:    Productive cough:     Wheezing:         Neurologic    Sudden weakness in arms or legs:     Sudden numbness in arms or legs:     Sudden onset of difficulty speaking or slurred speech:    Temporary loss of vision in one eye:     Problems with dizziness:         Gastrointestinal    Blood in stool:     Vomited blood:         Genitourinary    Burning when urinating:     Blood in urine:        Psychiatric    Major depression:         Hematologic    Bleeding problems:    Problems with blood clotting too easily:        Skin    Rashes or ulcers:        Constitutional    Fever or chills:     PHYSICAL EXAM:   Vitals:   08/14/18 1351  BP: 112/73  Pulse: 94  Resp: 18  SpO2: 95%  Weight: 201 lb (91.2 kg)  Height: 5\' 5"  (1.651 m)    GENERAL: The patient is a well-nourished female, in no acute distress. The vital signs are documented above. CARDIAC: There is a regular rate and rhythm.  VASCULAR: She has mild bilateral lower extremity swelling. PULMONARY: There is good air exchange bilaterally without wheezing or rales. SKIN: There are no ulcers or rashes noted. PSYCHIATRIC: The patient has a normal affect.  DATA:    No new data  MEDICAL ISSUES:   CHRONIC VENOUS INSUFFICIENCY: This patient is undergone successful endovenous laser ablation of the left great saphenous vein.  Her symptoms improved significantly.  She does have some spider veins bilaterally but is most concerned about the spider veins around her left ankle where she had a significant bleeding episode in September 2019.  She is very concerned about this understandably.  I think she would be a reasonable candidate for sclerotherapy.  I will discuss scheduling this with Sonya and we will be in touch with her tomorrow.  Deitra Mayo [facility] [phone]

## 2018-08-14 NOTE — Progress Notes (Signed)
Subjective:    Patient ID: [name] [name], female    DOB: 12-02-1950, 68 y.o.   MRN: [id number]  HPI: [name] [name] is a 68 y.o. female who returns for follow up appointment for chronic pain and medication refill. She states her pain is located in her neck radiating into her bilateral shoulders and lower back pain radiating into her bilateral lower extremities. Also reports her lower back pain has increased in intensity, she has an appointment with Dr. Vertell Limber scheduled she states. She  Rates her pain 7. Her.current exercise regime is walking.  Ms. Dedic Morphine equivalent is 330.00  MME. She is also prescribed Diazepam by Dr. Sherrie Sport .We have discussed the black box warning of using opioids and benzodiazepines. I highlighted the dangers of using these drugs together and discussed the adverse events including respiratory suppression, overdose, cognitive impairment and importance of compliance with current regimen. We will continue to monitor and adjust as indicated.   Last UDS was Performed on 07/18/2018, it was consistent.   Pain Inventory Average Pain 7 Pain Right Now 7 My pain is sharp, burning, stabbing, tingling and aching  In the last 24 hours, has pain interfered with the following? General activity 7 Relation with others 7 Enjoyment of life 7 What TIME of day is your pain at its worst? all Sleep (in general) Fair  Pain is worse with: walking, bending, standing and some activites Pain improves with: rest, heat/ice and medication Relief from Meds: 7  Mobility Do you have any goals in this area?  no  Function Do you have any goals in this area?  no  Neuro/Psych No problems in this area  Prior Studies Any changes since last visit?  no  Physicians involved in your care Primary care [facility]   Family History  Problem Relation Age of Onset  . Hypertension Mother   . Heart disease Mother   . Diabetes Mother   . Cancer Father   . Diabetes Brother    Social  History   Socioeconomic History  . Marital status: Divorced    Spouse name: Not on file  . Number of children: Not on file  . Years of education: Not on file  . Highest education level: Not on file  Occupational History  . Occupation: disable    Employer: DISABLED  Social Needs  . Financial resource strain: Not on file  . Food insecurity:    Worry: Not on file    Inability: Not on file  . Transportation needs:    Medical: Not on file    Non-medical: Not on file  Tobacco Use  . Smoking status: Never Smoker  . Smokeless tobacco: Never Used  Substance and Sexual Activity  . Alcohol use: No  . Drug use: No  . Sexual activity: Not on file    Comment: second hand smoke  Lifestyle  . Physical activity:    Days per week: Not on file    Minutes per session: Not on file  . Stress: Not on file  Relationships  . Social connections:    Talks on phone: Not on file    Gets together: Not on file    Attends religious service: Not on file    Active member of club or organization: Not on file    Attends meetings of clubs or organizations: Not on file    Relationship status: Not on file  Other Topics Concern  . Not on file  Social History Narrative  Has one child   Disabled   Past Surgical History:  Procedure Laterality Date  . ABDOMINAL HYSTERECTOMY  1987   W/ BSO  . ANTERIOR CERVICAL DECOMP/DISCECTOMY FUSION N/A 08/09/2012   Procedure: ANTERIOR CERVICAL DECOMPRESSION/DISCECTOMY FUSION 1 LEVEL;  Surgeon: Floyce Stakes, MD;  Location: [facility];  Service: Neurosurgery;  Laterality: N/A;  Cervical four-five  Anterior cervical decompression/diskectomy/fusion  . ANTERIOR FUSION CERVICAL SPINE  2007   C5 -6  . APPENDECTOMY    . APPLICATION OF INTRAOPERATIVE CT SCAN N/A 05/17/2017   Procedure: APPLICATION OF INTRAOPERATIVE CT SCAN;  Surgeon: Erline Levine, MD;  Location: [facility];  Service: Neurosurgery;  Laterality: N/A;  . BACK SURGERY     x 5  . BREAST EXCISIONAL BIOPSY  Bilateral    No scar seen   . BREAST SURGERY     x 2 biopsies  . CARPAL TUNNEL RELEASE  RIGHT - 2001  & DEC 2011 W/ BACK SURG.  . CERVICAL DISC SURGERY  2005   C5 - 6  . CHOLECYSTECTOMY  1994  . DORSAL COMPARTMENT RELEASE Right 04/07/2013   Procedure: RIGHT WRIST STS RELEASE;  Surgeon: Schuyler Amor, MD;  Location: [place];  Service: Orthopedics;  Laterality: Right;  . EXPLORATION OF INCISION FOR CSF LEAK  DEC 2011  X2   POST LAMINECOTMY  . FINGER ARTHRODESIS Right 04/07/2013   Procedure: RIGHT INDEX AND RIGHT LONG DISTAL INTERPHALANGEAL JOINT FUSIONS;  Surgeon: Schuyler Amor, MD;  Location: [place];  Service: Orthopedics;  Laterality: Right;  . FINGER ARTHROPLASTY Right 04/07/2013   Procedure: RIGHT THUMB [place] ARTHROPLASTY;  Surgeon: Schuyler Amor, MD;  Location: [place] [facility];  Service: Orthopedics;  Laterality: Right;  . GANGLION CYST EXCISION Right 04/07/2013   Procedure: RIGHT WRIST MASS EXCISION;  Surgeon: Schuyler Amor, MD;  Location: [place];  Service: Orthopedics;  Laterality: Right;  . KNEE ARTHROSCOPY  LEFT X3 (LAST ONE 2005)  . KNEE ARTHROSCOPY  05/17/2011   Procedure: ARTHROSCOPY KNEE;  Surgeon: Bradley Ferris III;  Location: [facility];  Service: Orthopedics;  Laterality: Right;  WITH MEDIAL MENISECTOMY AND removal of suprapatella fat lump  . LEFT WRIST TENOSYNECTOMY W/ LEFT THUMB JOINT REPAIR  02-24-10  . LUMBAR FUSION  11-30-10   L4 - 5  . LUMBAR FUSION  2000   L2 - 4  . LUMBAR LAMINECTOMY  DEC 2011   L4 - 5  . POSTERIOR CERVICAL FUSION/FORAMINOTOMY N/A 05/17/2017   Procedure: Cervical five  to Thorastic one  Posterior cervical fusion with lateral mass screws/revision of prior instrumentation;  Surgeon: Erline Levine, MD;  Location: [place];  Service: Neurosurgery;  Laterality: N/A;  C5 to T1 Posterior cervical fusion with lateral mass screws/revision of prior  instrumentation  . TENDON REPAIR  JAN 2010   LEFT INDEX AND LONG FINGERS  . TUBAL LIGATION     Past Medical History:  Diagnosis Date  . Acute sinusitis, unspecified   . Anemia    "quite a few times"  . Anxiety state, unspecified   . Arachnoiditis BILATERAL LEGS   DUE TO MULTIPLE BACK SURG.'S  . Arthritis   . Asthma    last flare up was 03/2017 lasted over a month  . Blood transfusion   . Cancer ([place])    skin cancers (in scalp)  . Cardiomyopathy HX --06/2010   EF was 25% during acute illness (PHELONEPHRITIS) Repeat echo 12-06-10 60% showed normal  EF.   . Chronic back pain greater than 3 months duration    S/P BACK SURG'S  . CSF leak   . Diabetes mellitus without complication ([place])    dx 2013 type 2  . Dyslipidemia   . Dysphagia    some post op cerv fusion 2/14  . Dysrhythmia   . Essential hypertension, benign   . Family history of adverse reaction to anesthesia    mother gets n/v  . GERD (gastroesophageal reflux disease) AND HIATIAL HERNIA   CONTROLLED W/ NEXIUM  . Headache(784.0)   . Heart murmur    DENIES S & S   (ECHO JUN'12 W/ CHART)  . History of chronic bronchitis   . Hx of bladder infections   . Hypertension   . Murmur, heart   . Neuromuscular disorder (HCC)    numbness and tingling  . Osteoporosis   . Other malaise and fatigue   . PONV (postoperative nausea and vomiting)   . Shortness of breath   . Spinal headache   . Spinal stenosis, cervical region   . Varicosities    venous  . Weakness of both legs DUE TO ARACHNOIDITIS   OCCASIONAL USES CANE   BP 124/80   Pulse 82   Ht 5\' 5"  (1.651 m)   Wt 201 lb (91.2 kg)   SpO2 92%   BMI 33.45 kg/m   Opioid Risk Score:   Fall Risk Score:  `1  Depression screen PHQ 2/9  Depression screen [facility] 2/9 08/14/2018 03/12/2018 10/25/2017 09/27/2017 07/02/2017 06/04/2017 05/03/2017  Decreased Interest 0 0 0 0 1 1 0  Down, Depressed, Hopeless - 0 0 0 1 1 0  PHQ - 2 Score 0 0 0 0 2 2 0  Altered sleeping - - - 0 - - -    Tired, decreased energy - - - 0 - - -  Change in appetite - - - 0 - - -  Feeling bad or failure about yourself  - - - 0 - - -  Trouble concentrating - - - 0 - - -  Moving slowly or fidgety/restless - - - 0 - - -  Suicidal thoughts - - - 0 - - -  PHQ-9 Score - - - 0 - - -  Some recent data might be hidden   Review of Systems  Constitutional: Negative.   HENT: Negative.   Eyes: Negative.   Respiratory: Negative.   Cardiovascular: Negative.   Gastrointestinal: Negative.   Endocrine: Negative.   Genitourinary: Negative.   Musculoskeletal: Negative.   Skin: Negative.   Allergic/Immunologic: Negative.   Neurological: Negative.   Hematological: Negative.   Psychiatric/Behavioral: Negative.   All other systems reviewed and are negative.      Objective:   Physical Exam Vitals signs and nursing note reviewed.  Constitutional:      Appearance: Normal appearance.  Neck:     Musculoskeletal: Normal range of motion and neck supple.     Comments: Cervical Paraspinal Tenderness: C-5-C-6 Cardiovascular:     Rate and Rhythm: Normal rate and regular rhythm.     Pulses: Normal pulses.     Heart sounds: Normal heart sounds.  Pulmonary:     Effort: Pulmonary effort is normal.     Breath sounds: Normal breath sounds.  Musculoskeletal:     Comments: Normal Muscle Bulk and Muscle Testing Reveals:  Upper Extremities: Full ROM and Muscle Strength 5/5 Bilateral AC Joint Tenderness  Thoracic Paraspinal Tenderness: T-7-T-9 Lumbar Hypersensitivity Lower Extremities: Full  ROM and Muscle Strength 5/5 Left Lower Extremity Flexion Produces Pain into Lumbar Arises from Table with ease Narrow Based Gait   Skin:    General: Skin is warm and dry.  Neurological:     Mental Status: She is alert and oriented to person, place, and time.  Psychiatric:        Mood and Affect: Mood normal.        Behavior: Behavior normal.           Assessment & Plan:  1. On 05/17/2017 :C5C6C7 T1 Posterior  Cervical Fusion with lateral mass fixation with AIRO Imaging, revision of prior instrumentation. By Dr. Vertell Limber.  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 Continuecurrent medication regimen withGabapentin800 mg QID.08/14/2018. Refilled:Oxycodone 15mg  one tablet every 6 hours as needed # 120 and Oxymorphone HCL 40 mg every 12 hours #60. We will continue the opioid monitoring program, this consists of regular clinic visits, examinations, urine drug screen, pill counts as well as use of [place] Controlled Substance Reporting System. 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 08/14/2018. 3. Anxiety/depression: Continue Valium, PCP Following. ContinueCymbalta. Continueto monitor.08/14/2018 4. Muscle Spasms: Continuecurrent medication regime withFlexeril. Continue to Monitor.08/14/2018. 5. Cervicalgia/ Cervical Radiculitis: Continuecurrent medication regime withGabapentin: Dr. Vertell Limber Following: S/P C5C6C7 T1 Posterior Cervical Fusion with lateral mass fixation with AIRO Imaging, revision of prior instrumentation by Dr. Vertell Limber on 05/17/2017. 08/14/2018. 7. Bilateral Thoracic Back Pain: Continue HEP as Tolerated and Continue current medication regimen. Continue to Monitor.08/14/2018. 8. Left lower extremity DVT/ Phlebitis: S/P endovenous laser ablation of left great saphenous vein on 05/09/2018 by Dr. Scot Dock: Vascular Following. 08/14/2018  20 minutes of face to face patient care time was spent during this visit. All questions were encouraged and answered.  F/U in 1 month

## 2018-08-30 ENCOUNTER — Encounter: Payer: Self-pay | Admitting: Pulmonary Disease

## 2018-08-30 ENCOUNTER — Telehealth: Payer: Self-pay | Admitting: Registered Nurse

## 2018-08-30 DIAGNOSIS — G039 Meningitis, unspecified: Secondary | ICD-10-CM

## 2018-08-30 DIAGNOSIS — M961 Postlaminectomy syndrome, not elsewhere classified: Secondary | ICD-10-CM

## 2018-08-30 MED ORDER — OXYMORPHONE HCL ER 40 MG PO T12A: 40.0000 mg | EXTENDED_RELEASE_TABLET | Freq: Two times a day (BID) | ORAL | 0 refills | Status: DC

## 2018-08-30 MED ORDER — OXYCODONE HCL 15 MG PO TABS: 15.0000 mg | ORAL_TABLET | Freq: Four times a day (QID) | ORAL | 0 refills | Status: DC | PRN

## 2018-08-30 NOTE — Telephone Encounter (Signed)
PMP was reviewed, Last Oxycodone and Oxymorphone was prescribed 08/06/2018. Medication was e-scribed.

## 2018-08-30 NOTE — Progress Notes (Signed)
PCCM:  I received a telephone call to return this patient's phone call as she is feeling unwell. I attempted twice at the telephone number received from our on call answering service. The telephone number received was [phone].  If she calls again. I will attempt to reach her again.   BLI

## 2018-09-02 ENCOUNTER — Ambulatory Visit: Payer: Medicare Other | Admitting: Gastroenterology

## 2018-09-06 ENCOUNTER — Other Ambulatory Visit: Payer: Self-pay | Admitting: Nurse Practitioner

## 2018-09-06 ENCOUNTER — Telehealth: Payer: Self-pay | Admitting: Internal Medicine

## 2018-09-06 ENCOUNTER — Other Ambulatory Visit: Payer: Self-pay

## 2018-09-06 DIAGNOSIS — R6889 Other general symptoms and signs: Secondary | ICD-10-CM

## 2018-09-06 MED ORDER — PREDNISONE 10 MG PO TABS: ORAL_TABLET | ORAL | 0 refills | Status: DC

## 2018-09-06 MED ORDER — DOXYCYCLINE HYCLATE 100 MG PO TABS: 100.0000 mg | ORAL_TABLET | Freq: Two times a day (BID) | ORAL | 0 refills | Status: DC

## 2018-09-06 NOTE — Telephone Encounter (Signed)
Primary Pulmonologist: Ramaswamy Last office visit and with whom: 07/18/2018 What do we see them for (pulmonary problems): Asthma Last OV assessment/plan:  1. Dysphagia, unspecified type R13.10   2. Moderate persistent asthma without complication S88.64   3. 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 Hoarseness of voice R49.0        Plan:     Patient Instructions  Dysphagia, unspecified type Hoarse voice  - refer GI - [place]. 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 This includes one or more of the following documented above: discussion of test results, diagnostic or treatment recommendations, prognosis, risks and benefits of management options, instructions, education, compliance or risk-factor reduction     Was appointment offered to patient (explain)?  Covid concerns   Reason for call: Pt states returned home from a road trip to [place] on 08/29/2018- woke up the following day with cough and sore throat. She states her cough is non prod. She has had some hoarseness and wheezing. She has been checking her temp and has not had a fever. She has had aches but had a fall while on her trip and thinks this is coming from that. Please advise thanks

## 2018-09-06 NOTE — Telephone Encounter (Signed)
Noted  

## 2018-09-06 NOTE — Telephone Encounter (Signed)
I have called the patient. She will go for covid testing now. I have also sent in medications to her pharmacy. Patient is aware.

## 2018-09-09 ENCOUNTER — Telehealth: Payer: Self-pay | Admitting: Registered Nurse

## 2018-09-09 NOTE — Telephone Encounter (Signed)
Routed to Zorita Pang to scheduled a virtual visit.

## 2018-09-09 NOTE — Telephone Encounter (Signed)
PT recently returned from [place] and is waiting COVID 19 test results. She has an appointment Thursday. Please call.

## 2018-09-12 ENCOUNTER — Encounter: Payer: Medicare Other | Attending: Physical Medicine & Rehabilitation | Admitting: Registered Nurse

## 2018-09-12 ENCOUNTER — Telehealth: Payer: Self-pay | Admitting: Internal Medicine

## 2018-09-12 ENCOUNTER — Other Ambulatory Visit: Payer: Self-pay

## 2018-09-12 ENCOUNTER — Encounter: Payer: Self-pay | Admitting: Registered Nurse

## 2018-09-12 VITALS — BP 155/92 | HR 92 | Ht 65.0 in | Wt 185.0 lb

## 2018-09-12 DIAGNOSIS — M62838 Other muscle spasm: Secondary | ICD-10-CM | POA: Diagnosis not present

## 2018-09-12 DIAGNOSIS — F418 Other specified anxiety disorders: Secondary | ICD-10-CM

## 2018-09-12 DIAGNOSIS — M5441 Lumbago with sciatica, right side: Secondary | ICD-10-CM | POA: Insufficient documentation

## 2018-09-12 DIAGNOSIS — M5416 Radiculopathy, lumbar region: Secondary | ICD-10-CM

## 2018-09-12 DIAGNOSIS — G039 Meningitis, unspecified: Secondary | ICD-10-CM | POA: Diagnosis not present

## 2018-09-12 DIAGNOSIS — M542 Cervicalgia: Secondary | ICD-10-CM

## 2018-09-12 DIAGNOSIS — Z981 Arthrodesis status: Secondary | ICD-10-CM | POA: Diagnosis not present

## 2018-09-12 DIAGNOSIS — Z79899 Other long term (current) drug therapy: Secondary | ICD-10-CM | POA: Insufficient documentation

## 2018-09-12 DIAGNOSIS — Z79891 Long term (current) use of opiate analgesic: Secondary | ICD-10-CM | POA: Diagnosis not present

## 2018-09-12 DIAGNOSIS — M5412 Radiculopathy, cervical region: Secondary | ICD-10-CM | POA: Diagnosis not present

## 2018-09-12 DIAGNOSIS — M961 Postlaminectomy syndrome, not elsewhere classified: Secondary | ICD-10-CM | POA: Diagnosis not present

## 2018-09-12 DIAGNOSIS — Z5181 Encounter for therapeutic drug level monitoring: Secondary | ICD-10-CM | POA: Diagnosis not present

## 2018-09-12 DIAGNOSIS — G894 Chronic pain syndrome: Secondary | ICD-10-CM | POA: Diagnosis not present

## 2018-09-12 NOTE — Progress Notes (Signed)
Subjective:    Patient ID: [name] [name], female    DOB: April 05, 1951, 68 y.o.   MRN: [id number]  HPI: This provider placed a call to [name] [name] is a 68 y.o. female, her appointment was changed to a virtual office visit to reduce the risk of exposure to the COVID-19 virus and to help her remain healthy and safe. The virtual visit will also provide continuity of care. She verbalizes understanding.  . She states her pain is located in her neck radiating into her bilateral shoulders, mid- lower back pain radiating into her bilateral lower extremities. She  Rates her pain 7. Her.current exercise regime is walking.   [name] [name] Morphine equivalent is 330.00  MME. She is also prescribed Diazepam by Dr. Sherrie Sport.We have discussed the black box warning of using opioids and benzodiazepines. I highlighted the dangers of using these drugs together and discussed the adverse events including respiratory suppression, overdose, cognitive impairment and importance of compliance with current regimen. We will continue to monitor and adjust as indicated.   Last UDS was Performed on 07/18/2018, it was consistent.    Pain Inventory Average Pain 7 Pain Right Now 7 My pain is constant, burning, stabbing and aching  In the last 24 hours, has pain interfered with the following? General activity 9 Relation with others 7 Enjoyment of life 9 What TIME of day is your pain at its worst? always Sleep (in general) Poor  Pain is worse with: inactivity and some activites Pain improves with: rest, heat/ice and medication Relief from Meds: 8  Mobility walk without assistance walk with assistance use a cane use a walker ability to climb steps?  no do you drive?  no  Function disabled: date disabled .  Neuro/Psych weakness numbness tingling trouble walking spasms dizziness anxiety  Prior Studies Any changes since last visit?  no  Physicians involved in your care Any changes since last visit?  no   Family History  Problem Relation Age of Onset  . Hypertension Mother   . Heart disease Mother   . Diabetes Mother   . Cancer Father   . Diabetes Brother    Social History   Socioeconomic History  . Marital status: Divorced    Spouse name: Not on file  . Number of children: Not on file  . Years of education: Not on file  . Highest education level: Not on file  Occupational History  . Occupation: disable    Employer: DISABLED  Social Needs  . Financial resource strain: Not on file  . Food insecurity:    Worry: Not on file    Inability: Not on file  . Transportation needs:    Medical: Not on file    Non-medical: Not on file  Tobacco Use  . Smoking status: Never Smoker  . Smokeless tobacco: Never Used  Substance and Sexual Activity  . Alcohol use: No  . Drug use: No  . Sexual activity: Not on file    Comment: second hand smoke  Lifestyle  . Physical activity:    Days per week: Not on file    Minutes per session: Not on file  . Stress: Not on file  Relationships  . Social connections:    Talks on phone: Not on file    Gets together: Not on file    Attends religious service: Not on file    Active member of club or organization: Not on file    Attends meetings of clubs or organizations:  Not on file    Relationship status: Not on file  Other Topics Concern  . Not on file  Social History Narrative   Has one child   Disabled   Past Surgical History:  Procedure Laterality Date  . ABDOMINAL HYSTERECTOMY  1987   W/ BSO  . ANTERIOR CERVICAL DECOMP/DISCECTOMY FUSION N/A 08/09/2012   Procedure: ANTERIOR CERVICAL DECOMPRESSION/DISCECTOMY FUSION 1 LEVEL;  Surgeon: Floyce Stakes, MD;  Location: [facility];  Service: Neurosurgery;  Laterality: N/A;  Cervical four-five  Anterior cervical decompression/diskectomy/fusion  . ANTERIOR FUSION CERVICAL SPINE  2007   C5 -6  . APPENDECTOMY    . APPLICATION OF INTRAOPERATIVE CT SCAN N/A 05/17/2017   Procedure: APPLICATION OF  INTRAOPERATIVE CT SCAN;  Surgeon: Erline Levine, MD;  Location: [place];  Service: Neurosurgery;  Laterality: N/A;  . BACK SURGERY     x 5  . BREAST EXCISIONAL BIOPSY Bilateral    No scar seen   . BREAST SURGERY     x 2 biopsies  . CARPAL TUNNEL RELEASE  RIGHT - 2001  & DEC 2011 W/ BACK SURG.  . CERVICAL DISC SURGERY  2005   C5 - 6  . CHOLECYSTECTOMY  1994  . DORSAL COMPARTMENT RELEASE Right 04/07/2013   Procedure: RIGHT WRIST STS RELEASE;  Surgeon: Schuyler Amor, MD;  Location: [place];  Service: Orthopedics;  Laterality: Right;  . EXPLORATION OF INCISION FOR CSF LEAK  DEC 2011  X2   POST LAMINECOTMY  . FINGER ARTHRODESIS Right 04/07/2013   Procedure: RIGHT INDEX AND RIGHT LONG DISTAL INTERPHALANGEAL JOINT FUSIONS;  Surgeon: Schuyler Amor, MD;  Location: [place];  Service: Orthopedics;  Laterality: Right;  . FINGER ARTHROPLASTY Right 04/07/2013   Procedure: RIGHT THUMB [place] ARTHROPLASTY;  Surgeon: Schuyler Amor, MD;  Location: [place];  Service: Orthopedics;  Laterality: Right;  . GANGLION CYST EXCISION Right 04/07/2013   Procedure: RIGHT WRIST MASS EXCISION;  Surgeon: Schuyler Amor, MD;  Location: [place];  Service: Orthopedics;  Laterality: Right;  . KNEE ARTHROSCOPY  LEFT X3 (LAST ONE 2005)  . KNEE ARTHROSCOPY  05/17/2011   Procedure: ARTHROSCOPY KNEE;  Surgeon: Bradley Ferris III;  Location: [place];  Service: Orthopedics;  Laterality: Right;  WITH MEDIAL MENISECTOMY AND removal of suprapatella fat lump  . LEFT WRIST TENOSYNECTOMY W/ LEFT THUMB JOINT REPAIR  02-24-10  . LUMBAR FUSION  11-30-10   L4 - 5  . LUMBAR FUSION  2000   L2 - 4  . LUMBAR LAMINECTOMY  DEC 2011   L4 - 5  . POSTERIOR CERVICAL FUSION/FORAMINOTOMY N/A 05/17/2017   Procedure: Cervical five  to Thorastic one  Posterior cervical fusion with lateral mass screws/revision of prior instrumentation;  Surgeon:  Erline Levine, MD;  Location: [place];  Service: Neurosurgery;  Laterality: N/A;  C5 to T1 Posterior cervical fusion with lateral mass screws/revision of prior instrumentation  . TENDON REPAIR  JAN 2010   LEFT INDEX AND LONG FINGERS  . TUBAL LIGATION     Past Medical History:  Diagnosis Date  . Acute sinusitis, unspecified   . Anemia    "quite a few times"  . Anxiety state, unspecified   . Arachnoiditis BILATERAL LEGS   DUE TO MULTIPLE BACK SURG.'S  . Arthritis   . Asthma    last flare up was 03/2017 lasted over a month  . Blood transfusion   . Cancer ([place])  skin cancers (in scalp)  . Cardiomyopathy HX --06/2010   EF was 25% during acute illness (PHELONEPHRITIS) Repeat echo 12-06-10 60% showed normal EF.   Marland Kitchen Chronic back pain greater than 3 months duration    S/P BACK SURG'S  . CSF leak   . Diabetes mellitus without complication ([place])    dx 2013 type 2  . Dyslipidemia   . Dysphagia    some post op cerv fusion 2/14  . Dysrhythmia   . Essential hypertension, benign   . Family history of adverse reaction to anesthesia    mother gets n/v  . GERD (gastroesophageal reflux disease) AND HIATIAL HERNIA   CONTROLLED W/ NEXIUM  . Headache(784.0)   . Heart murmur    DENIES S & S   (ECHO JUN'12 W/ CHART)  . History of chronic bronchitis   . Hx of bladder infections   . Hypertension   . Murmur, heart   . Neuromuscular disorder (HCC)    numbness and tingling  . Osteoporosis   . Other malaise and fatigue   . PONV (postoperative nausea and vomiting)   . Shortness of breath   . Spinal headache   . Spinal stenosis, cervical region   . Varicosities    venous  . Weakness of both legs DUE TO ARACHNOIDITIS   OCCASIONAL USES CANE   BP (!) 155/92 Comment: home B/P machine  Pulse 92 Comment: home B/P Machine  Ht 5\' 5"  (1.651 m)   Wt 185 lb (83.9 kg)   SpO2 95% Comment: phone App  BMI 30.79 kg/m   Opioid Risk Score:   Fall Risk Score:  `1  Depression screen PHQ 2/9   Depression screen [facility] 2/9 08/14/2018 03/12/2018 10/25/2017 09/27/2017 07/02/2017 06/04/2017 05/03/2017  Decreased Interest 0 0 0 0 1 1 0  Down, Depressed, Hopeless - 0 0 0 1 1 0  PHQ - 2 Score 0 0 0 0 2 2 0  Altered sleeping - - - 0 - - -  Tired, decreased energy - - - 0 - - -  Change in appetite - - - 0 - - -  Feeling bad or failure about yourself  - - - 0 - - -  Trouble concentrating - - - 0 - - -  Moving slowly or fidgety/restless - - - 0 - - -  Suicidal thoughts - - - 0 - - -  PHQ-9 Score - - - 0 - - -  Some recent data might be hidden    Review of Systems  Constitutional: Positive for chills, diaphoresis and unexpected weight change.  Eyes: Negative.   Respiratory: Positive for cough, shortness of breath and wheezing.   Gastrointestinal: Positive for abdominal pain, diarrhea, nausea and vomiting.  Musculoskeletal: Positive for arthralgias, back pain, gait problem and neck pain.       Spasms   Skin: Negative.   Neurological: Positive for dizziness, weakness and numbness.       Tingling  Hematological: Bruises/bleeds easily.       Objective:   Physical Exam Nursing note reviewed.  Neurological:     Mental Status: She is oriented to person, place, and time.           Assessment & Plan:  1. On 05/17/2017 :C5C6C7 T1 Posterior Cervical Fusion with lateral mass fixation with AIRO Imaging, revision of prior instrumentation. By Dr. Vertell Limber.  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 Continuecurrent medication regimen withGabapentin800 mg QID.09/12/2018. Continue:Oxycodone 15mg  one tablet every 6  hours as needed # 120 and  Oxymorphone HCL 40 mg every 12 hours #60. We will continue the opioid monitoring program, this consists of regular clinic visits, examinations, urine drug screen, pill counts as well as use of [place] Controlled Substance Reporting System. 2.Lumbar Post-laminectomy: Lumbar arachnoiditis with chronic lower  extremity neuropathic pain.Continue withGabapentin 800mg  QID Continue to Monitor. 09/12/2018 3. Anxiety/depression: Continue Valium, PCP Following. ContinueCymbalta. Continueto monitor. 09/12/2018 4. Muscle Spasms: Continuecurrent medication regime withFlexeril. Continue to Monitor. 09/12/2018 5. Cervicalgia/ Cervical Radiculitis: Continuecurrent medication regime withGabapentin: Dr. Vertell Limber Following: S/P C5C6C7 T1 Posterior Cervical Fusion with lateral mass fixation with AIRO Imaging, revision of prior instrumentation by Dr. Vertell Limber on 05/17/2017. 06/07/2018 7. Bilateral Thoracic Back Pain: Continue HEP as Tolerated and Continue current medication regimen. Continue to Monitor.09/12/2018  8. Left lower extremity DVT/ Phlebitis:  S/P endovenous laser ablation of left great saphenous vein on 05/09/2018 by Dr. Scot Dock:  Vascular  Following  F/U in 1 month

## 2018-09-12 NOTE — Telephone Encounter (Signed)
Spoke with the [name] [name] and notified that her results of the covid 19 are still not back yet- can take up to 9 days  She is still having symptoms of cough and SOB  Unsure of fever today Will continue to isolate Still has a few days of pred and abx  Will call if not improving or worsens  [name] ordered the test, but for some reason the ordering provider in the system is Dr. Jodi Mourning  Results will not come directly to [place], so I am going to keep an eye out for these and let [name] review once they come in Will hold in my basket for f/u

## 2018-09-15 LAB — NOVEL CORONAVIRUS, NAA: SARS-CoV-2, NAA: NOT DETECTED

## 2018-09-16 NOTE — Telephone Encounter (Signed)
Called to check on the pt and to let her know that the covid test was neg   SARS-CoV-2, NAA Not Detected Not Detected    LMTCB

## 2018-09-17 NOTE — Telephone Encounter (Signed)
Called and spoke with patient she is aware and verbalized understanding. Nothing further needed.  

## 2018-09-24 ENCOUNTER — Ambulatory Visit: Payer: Medicare Other | Admitting: Gastroenterology

## 2018-09-30 ENCOUNTER — Other Ambulatory Visit: Payer: Self-pay

## 2018-09-30 ENCOUNTER — Encounter: Payer: Self-pay | Admitting: Registered Nurse

## 2018-09-30 ENCOUNTER — Encounter: Payer: Medicare Other | Attending: Physical Medicine & Rehabilitation | Admitting: Registered Nurse

## 2018-09-30 VITALS — BP 128/97 | HR 86 | Ht 65.0 in | Wt 185.0 lb

## 2018-09-30 DIAGNOSIS — M5416 Radiculopathy, lumbar region: Secondary | ICD-10-CM | POA: Diagnosis not present

## 2018-09-30 DIAGNOSIS — M62838 Other muscle spasm: Secondary | ICD-10-CM | POA: Diagnosis not present

## 2018-09-30 DIAGNOSIS — M961 Postlaminectomy syndrome, not elsewhere classified: Secondary | ICD-10-CM | POA: Insufficient documentation

## 2018-09-30 DIAGNOSIS — Z79891 Long term (current) use of opiate analgesic: Secondary | ICD-10-CM | POA: Diagnosis not present

## 2018-09-30 DIAGNOSIS — F418 Other specified anxiety disorders: Secondary | ICD-10-CM

## 2018-09-30 DIAGNOSIS — M5441 Lumbago with sciatica, right side: Secondary | ICD-10-CM | POA: Insufficient documentation

## 2018-09-30 DIAGNOSIS — Z79899 Other long term (current) drug therapy: Secondary | ICD-10-CM | POA: Insufficient documentation

## 2018-09-30 DIAGNOSIS — G039 Meningitis, unspecified: Secondary | ICD-10-CM | POA: Insufficient documentation

## 2018-09-30 DIAGNOSIS — M5412 Radiculopathy, cervical region: Secondary | ICD-10-CM

## 2018-09-30 DIAGNOSIS — Z5181 Encounter for therapeutic drug level monitoring: Secondary | ICD-10-CM | POA: Insufficient documentation

## 2018-09-30 DIAGNOSIS — M542 Cervicalgia: Secondary | ICD-10-CM | POA: Diagnosis not present

## 2018-09-30 DIAGNOSIS — G894 Chronic pain syndrome: Secondary | ICD-10-CM | POA: Diagnosis not present

## 2018-09-30 MED ORDER — OXYMORPHONE HCL ER 40 MG PO T12A: 40.0000 mg | EXTENDED_RELEASE_TABLET | Freq: Two times a day (BID) | ORAL | 0 refills | Status: DC

## 2018-09-30 MED ORDER — OXYCODONE HCL 10 MG PO TABS: 10.0000 mg | ORAL_TABLET | Freq: Every day | ORAL | 0 refills | Status: DC | PRN

## 2018-09-30 NOTE — Progress Notes (Signed)
Subjective:    Patient ID: [name] [name], female    DOB: [birth year], 68 y.o.   MRN: [id number]  HPI: [name] [name] is a 68 y.o. female her appointment was changed, due to national recommendations of social distancing due to [place] 19, an audio/video telehealth visit is felt to be most appropriate for this patient at this time.  See Chart message from today for the patient's consent to telehealth from [facility].     She states her pain is located in her neck radiating into her bilateral shoulders and mid- lower back radiating into her bilateral lower extremities R>L. She  Rates her pain 8. Her current exercise regime is walking.   [name] [name] reports on 08/28/2018 when she was in [place] she was holding her dog and lost her balanced and fell backwards, her son helped her up. She didn't seek medical attention. She has a scheduled appointment with Dr. Vertell Limber she reports.   [name] [name] Morphine equivalent is 330.00MME. Last UDS was Performed on 07/18/2018, it was consistent.   Mancel Parsons CMA asked the Health and History Questions. This provider and Mancel Parsons verified we were speaking with the correct person using two identifiers.    Pain Inventory Average Pain 8 Pain Right Now 8 My pain is constant, burning and stinging  In the last 24 hours, has pain interfered with the following? General activity 7 Relation with others 7 Enjoyment of life 7 What TIME of day is your pain at its worst? all Sleep (in general) Poor  Pain is worse with: walking, bending, sitting, inactivity, standing and some activites Pain improves with: heat/ice and medication Relief from Meds: 8  Mobility walk without assistance walk with assistance use a cane use a walker ability to climb steps?  yes do you drive?  yes  Function disabled: date disabled .  Neuro/Psych weakness numbness tingling trouble walking spasms anxiety  Prior Studies Any changes since last  visit?  no  Physicians involved in your care    Family History  Problem Relation Age of Onset  . Hypertension Mother   . Heart disease Mother   . Diabetes Mother   . Cancer Father   . Diabetes Brother    Social History   Socioeconomic History  . Marital status: Divorced    Spouse name: Not on file  . Number of children: Not on file  . Years of education: Not on file  . Highest education level: Not on file  Occupational History  . Occupation: disable    Employer: DISABLED  Social Needs  . Financial resource strain: Not on file  . Food insecurity:    Worry: Not on file    Inability: Not on file  . Transportation needs:    Medical: Not on file    Non-medical: Not on file  Tobacco Use  . Smoking status: Never Smoker  . Smokeless tobacco: Never Used  Substance and Sexual Activity  . Alcohol use: No  . Drug use: No  . Sexual activity: Not on file    Comment: second hand smoke  Lifestyle  . Physical activity:    Days per week: Not on file    Minutes per session: Not on file  . Stress: Not on file  Relationships  . Social connections:    Talks on phone: Not on file    Gets together: Not on file    Attends religious service: Not on file    Active member  of club or organization: Not on file    Attends meetings of clubs or organizations: Not on file    Relationship status: Not on file  Other Topics Concern  . Not on file  Social History Narrative   Has one child   Disabled   Past Surgical History:  Procedure Laterality Date  . ABDOMINAL HYSTERECTOMY  1987   W/ BSO  . ANTERIOR CERVICAL DECOMP/DISCECTOMY FUSION N/A 08/09/2012   Procedure: ANTERIOR CERVICAL DECOMPRESSION/DISCECTOMY FUSION 1 LEVEL;  Surgeon: Floyce Stakes, MD;  Location: [facility];  Service: Neurosurgery;  Laterality: N/A;  Cervical four-five  Anterior cervical decompression/diskectomy/fusion  . ANTERIOR FUSION CERVICAL SPINE  2007   C5 -6  . APPENDECTOMY    . APPLICATION OF INTRAOPERATIVE  CT SCAN N/A 05/17/2017   Procedure: APPLICATION OF INTRAOPERATIVE CT SCAN;  Surgeon: Erline Levine, MD;  Location: [place];  Service: Neurosurgery;  Laterality: N/A;  . BACK SURGERY     x 5  . BREAST EXCISIONAL BIOPSY Bilateral    No scar seen   . BREAST SURGERY     x 2 biopsies  . CARPAL TUNNEL RELEASE  RIGHT - 2001  & DEC 2011 W/ BACK SURG.  . CERVICAL DISC SURGERY  2005   C5 - 6  . CHOLECYSTECTOMY  1994  . DORSAL COMPARTMENT RELEASE Right 04/07/2013   Procedure: RIGHT WRIST STS RELEASE;  Surgeon: Schuyler Amor, MD;  Location: [place];  Service: Orthopedics;  Laterality: Right;  . EXPLORATION OF INCISION FOR CSF LEAK  DEC 2011  X2   POST LAMINECOTMY  . FINGER ARTHRODESIS Right 04/07/2013   Procedure: RIGHT INDEX AND RIGHT LONG DISTAL INTERPHALANGEAL JOINT FUSIONS;  Surgeon: Schuyler Amor, MD;  Location: [place];  Service: Orthopedics;  Laterality: Right;  . FINGER ARTHROPLASTY Right 04/07/2013   Procedure: RIGHT THUMB [place] ARTHROPLASTY;  Surgeon: Schuyler Amor, MD;  Location: [place];  Service: Orthopedics;  Laterality: Right;  . GANGLION CYST EXCISION Right 04/07/2013   Procedure: RIGHT WRIST MASS EXCISION;  Surgeon: Schuyler Amor, MD;  Location: [place];  Service: Orthopedics;  Laterality: Right;  . KNEE ARTHROSCOPY  LEFT X3 (LAST ONE 2005)  . KNEE ARTHROSCOPY  05/17/2011   Procedure: ARTHROSCOPY KNEE;  Surgeon: Bradley Ferris III;  Location: [place];  Service: Orthopedics;  Laterality: Right;  WITH MEDIAL MENISECTOMY AND removal of suprapatella fat lump  . LEFT WRIST TENOSYNECTOMY W/ LEFT THUMB JOINT REPAIR  02-24-10  . LUMBAR FUSION  11-30-10   L4 - 5  . LUMBAR FUSION  2000   L2 - 4  . LUMBAR LAMINECTOMY  DEC 2011   L4 - 5  . POSTERIOR CERVICAL FUSION/FORAMINOTOMY N/A 05/17/2017   Procedure: Cervical five  to Thorastic one  Posterior cervical fusion with lateral mass  screws/revision of prior instrumentation;  Surgeon: Erline Levine, MD;  Location: [place];  Service: Neurosurgery;  Laterality: N/A;  C5 to T1 Posterior cervical fusion with lateral mass screws/revision of prior instrumentation  . TENDON REPAIR  JAN 2010   LEFT INDEX AND LONG FINGERS  . TUBAL LIGATION     Past Medical History:  Diagnosis Date  . Acute sinusitis, unspecified   . Anemia    "quite a few times"  . Anxiety state, unspecified   . Arachnoiditis BILATERAL LEGS   DUE TO MULTIPLE BACK SURG.'S  . Arthritis   . Asthma    last flare up  was 03/2017 lasted over a month  . Blood transfusion   . Cancer ([place])    skin cancers (in scalp)  . Cardiomyopathy HX --06/2010   EF was 25% during acute illness (PHELONEPHRITIS) Repeat echo 12-06-10 60% showed normal EF.   Marland Kitchen Chronic back pain greater than 3 months duration    S/P BACK SURG'S  . CSF leak   . Diabetes mellitus without complication ([place])    dx 2013 type 2  . Dyslipidemia   . Dysphagia    some post op cerv fusion 2/14  . Dysrhythmia   . Essential hypertension, benign   . Family history of adverse reaction to anesthesia    mother gets n/v  . GERD (gastroesophageal reflux disease) AND HIATIAL HERNIA   CONTROLLED W/ NEXIUM  . Headache(784.0)   . Heart murmur    DENIES S & S   (ECHO JUN'12 W/ CHART)  . History of chronic bronchitis   . Hx of bladder infections   . Hypertension   . Murmur, heart   . Neuromuscular disorder (HCC)    numbness and tingling  . Osteoporosis   . Other malaise and fatigue   . PONV (postoperative nausea and vomiting)   . Shortness of breath   . Spinal headache   . Spinal stenosis, cervical region   . Varicosities    venous  . Weakness of both legs DUE TO ARACHNOIDITIS   OCCASIONAL USES CANE   Ht 5\' 5"  (1.651 m)   Wt 185 lb (83.9 kg)   BMI 30.79 kg/m   Opioid Risk Score:   Fall Risk Score:  `1  Depression screen PHQ 2/9  Depression screen [facility] 2/9 08/14/2018 03/12/2018 10/25/2017 09/27/2017  07/02/2017 06/04/2017 05/03/2017  Decreased Interest 0 0 0 0 1 1 0  Down, Depressed, Hopeless - 0 0 0 1 1 0  PHQ - 2 Score 0 0 0 0 2 2 0  Altered sleeping - - - 0 - - -  Tired, decreased energy - - - 0 - - -  Change in appetite - - - 0 - - -  Feeling bad or failure about yourself  - - - 0 - - -  Trouble concentrating - - - 0 - - -  Moving slowly or fidgety/restless - - - 0 - - -  Suicidal thoughts - - - 0 - - -  PHQ-9 Score - - - 0 - - -  Some recent data might be hidden    Review of Systems  Constitutional: Negative.   HENT: Negative.   Eyes: Negative.   Respiratory: Positive for cough, shortness of breath and wheezing.   Cardiovascular: Negative.   Gastrointestinal: Negative.   Endocrine: Negative.   Genitourinary: Negative.   Musculoskeletal: Positive for arthralgias, back pain, gait problem, neck pain and neck stiffness.  Skin: Negative.   Allergic/Immunologic: Negative.   Neurological: Positive for tremors, weakness and numbness.       Tingling  Psychiatric/Behavioral: The patient is nervous/anxious.        Objective:   Physical Exam Vitals signs and nursing note reviewed.           Assessment & Plan:  1. On 05/17/2017 :C5C6C7 T1 Posterior Cervical Fusion with lateral mass fixation with AIRO Imaging, revision of prior instrumentation. By Dr. Vertell Limber.  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 Continuecurrent medication regimen withGabapentin800 mg QID.Marland Kitchen Continue:Oxycodone 15mg  one tablet every 6 hours as needed # 120 and Oxymorphone HCL 40 mg every  12 hours #60. 09/30/2018 We will continue the opioid monitoring program, this consists of regular clinic visits, examinations, urine drug screen, pill counts as well as use of [place] Controlled Substance Reporting System. 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  09/30/2018 3. Anxiety/depression: Continue Valium, PCP Following. ContinueCymbalta. Continueto monitor.09/30/2018 4. Muscle Spasms: Continuecurrent medication regime withFlexeril. Continue to Monitor.09/30/2018 5. Cervicalgia/ Cervical Radiculitis: Continuecurrent medication regime withGabapentin: Dr. Vertell Limber Following: S/P C5C6C7 T1 Posterior Cervical Fusion with lateral mass fixation with AIRO Imaging, revision of prior instrumentation by Dr. Vertell Limber on 05/17/2017. 09/30/2018 7. Bilateral Thoracic Back Pain: No Complaints Today.Continue HEP as Tolerated and Continue current medication regimen. Continue to Monitor.09/30/2018  8. Left lower extremity DVT/ Phlebitis: S/P endovenous laser ablation of left great saphenous vein on 05/09/2018 by Dr. Scot Dock: Vascular Following  F/U in 1 month Telephone Call  Location of patient: In her Home Location of provider: Office Established patient Time spent on call: 20 minutes

## 2018-10-08 ENCOUNTER — Ambulatory Visit: Payer: Medicare Other | Admitting: Gastroenterology

## 2018-10-08 DIAGNOSIS — I872 Venous insufficiency (chronic) (peripheral): Secondary | ICD-10-CM | POA: Diagnosis not present

## 2018-10-08 DIAGNOSIS — E1141 Type 2 diabetes mellitus with diabetic mononeuropathy: Secondary | ICD-10-CM | POA: Diagnosis not present

## 2018-10-08 DIAGNOSIS — Z0000 Encounter for general adult medical examination without abnormal findings: Secondary | ICD-10-CM | POA: Diagnosis not present

## 2018-10-08 DIAGNOSIS — Z1389 Encounter for screening for other disorder: Secondary | ICD-10-CM | POA: Diagnosis not present

## 2018-10-08 DIAGNOSIS — F418 Other specified anxiety disorders: Secondary | ICD-10-CM | POA: Diagnosis not present

## 2018-10-08 DIAGNOSIS — M545 Low back pain: Secondary | ICD-10-CM | POA: Diagnosis not present

## 2018-10-15 ENCOUNTER — Encounter: Payer: Self-pay | Admitting: General Surgery

## 2018-10-16 ENCOUNTER — Other Ambulatory Visit: Payer: Self-pay

## 2018-10-16 ENCOUNTER — Encounter: Payer: Medicare Other | Admitting: Gastroenterology

## 2018-10-16 DIAGNOSIS — M542 Cervicalgia: Secondary | ICD-10-CM | POA: Diagnosis not present

## 2018-10-16 DIAGNOSIS — Z6834 Body mass index (BMI) 34.0-34.9, adult: Secondary | ICD-10-CM | POA: Diagnosis not present

## 2018-10-16 DIAGNOSIS — M5412 Radiculopathy, cervical region: Secondary | ICD-10-CM | POA: Diagnosis not present

## 2018-10-16 DIAGNOSIS — M545 Low back pain: Secondary | ICD-10-CM | POA: Diagnosis not present

## 2018-10-16 DIAGNOSIS — M5416 Radiculopathy, lumbar region: Secondary | ICD-10-CM | POA: Diagnosis not present

## 2018-10-16 NOTE — Progress Notes (Signed)
This encounter was created in error - please disregard.

## 2018-10-17 ENCOUNTER — Ambulatory Visit: Payer: Medicare Other | Admitting: Internal Medicine

## 2018-10-22 ENCOUNTER — Encounter: Payer: Self-pay | Admitting: General Surgery

## 2018-10-23 ENCOUNTER — Encounter: Payer: Self-pay | Admitting: Gastroenterology

## 2018-10-23 ENCOUNTER — Other Ambulatory Visit: Payer: Self-pay

## 2018-10-23 ENCOUNTER — Ambulatory Visit (INDEPENDENT_AMBULATORY_CARE_PROVIDER_SITE_OTHER): Payer: Medicare Other | Admitting: Gastroenterology

## 2018-10-23 VITALS — Ht 65.0 in | Wt 185.0 lb

## 2018-10-23 DIAGNOSIS — R1310 Dysphagia, unspecified: Secondary | ICD-10-CM | POA: Diagnosis not present

## 2018-10-23 DIAGNOSIS — K219 Gastro-esophageal reflux disease without esophagitis: Secondary | ICD-10-CM

## 2018-10-23 NOTE — Progress Notes (Addendum)
History of Present Illness: This is a 68 year old female referred by Brand Males, MD for the evaluation of dysphagia.  She relates intermittent problems with swallowing primarily solids since her cervical spine C5-T1 fusion surgery in November 2018.   She relates hoarseness throat discomfort as well.  She was evaluated by Dr. Melida Quitter who felt her symptoms were likely reflux related her Nexium was increased from 40 mg daily to 40 mg twice daily for 3 months with no change in symptoms.  She was changed to pantoprazole due to insurance coverage and her reflux symptoms are essentially unchanged.  She states she underwent upper endoscopy several years ago but does not recall the physician or the results. Denies weight loss, abdominal pain, constipation, diarrhea, change in stool caliber, melena, hematochezia, nausea, vomiting, chest pain.    Allergies  Allergen Reactions  . Latex Itching and Rash  . Morphine And Related Hives and Itching  . Aspirin Nausea And Vomiting    Can take coated asa  . Biaxin [Clarithromycin] Nausea And Vomiting  . Clarithromycin Diarrhea  . Codeine Itching  . Darvocet [Propoxyphene N-Acetaminophen] Itching  . Hydrocodone-Acetaminophen Itching  . Lortab [Hydrocodone-Acetaminophen] Itching  . Percocet [Oxycodone-Acetaminophen] Itching  . Tramadol Nausea Only   Outpatient Medications Prior to Visit  Medication Sig Dispense Refill  . alendronate (FOSAMAX) 70 MG tablet Take 70 mg by mouth every Wednesday. Take with a full glass of water on an empty stomach.     Marland Kitchen aspirin EC 81 MG tablet Take 81 mg by mouth at bedtime.     . budesonide-formoterol (SYMBICORT) 80-4.5 MCG/ACT inhaler Inhale 2 puffs into the lungs 2 (two) times daily. 1 Inhaler 6  . Calcium Carbonate-Vit D-Min 1200-1000 MG-UNIT CHEW Chew 1 tablet by mouth daily.     . diazepam (VALIUM) 10 MG tablet Take 5 mg by mouth See admin instructions.     Marland Kitchen diltiazem (CARDIZEM CD) 240 MG 24 hr capsule Take  240 mg by mouth daily.    . DULoxetine (CYMBALTA) 60 MG capsule TAKE ONE CAPSULE BY MOUTH DAILY 30 capsule 3  . estradiol (ESTRACE) 2 MG tablet Take 2 mg by mouth daily.     Marland Kitchen gabapentin (NEURONTIN) 800 MG tablet Take 1 tablet (800 mg total) by mouth 4 (four) times daily. 120 tablet 5  . JINTELI 1-5 MG-MCG TABS tablet Take 1 tablet by mouth daily.     Marland Kitchen levalbuterol (XOPENEX HFA) 45 MCG/ACT inhaler Inhale 1-2 puffs into the lungs every 4 (four) hours as needed. For shortness of breath 1 Inhaler 6  . metFORMIN (GLUCOPHAGE) 500 MG tablet Take 500 mg by mouth 2 (two) times daily with a meal.     . Milk Thistle 200 MG CAPS Take 1 capsule by mouth daily.    . montelukast (SINGULAIR) 10 MG tablet Take 10 mg by mouth at bedtime.    . naloxone (NARCAN) nasal spray 4 mg/0.1 mL Place 1 spray into the nose once as needed (Just in case of Opiod Overdose).    . Omega-3 Fatty Acids (FISH OIL) 1000 MG CAPS Take 1 capsule by mouth 3 (three) times daily.    . Oxycodone HCl 10 MG TABS Take 1 tablet (10 mg total) by mouth 5 (five) times daily as needed. 150 tablet 0  . Oxymorphone HCl, Crush Resist, 40 MG PO T12A Take 40 mg by mouth 2 (two) times daily. 60 tablet 0  . predniSONE (DELTASONE) 10 MG tablet Take 4 tabs for  2 days, then 3 tabs for 2 days, then 2 tabs for 2 days, then 1 tab for 2 days, then stop 20 tablet 0  . valACYclovir (VALTREX) 500 MG tablet Take 500 mg by mouth daily.     . vitamin B-12 (CYANOCOBALAMIN) 1000 MCG tablet Take 1,000 mcg by mouth daily.      Facility-Administered Medications Prior to Visit  Medication Dose Route Frequency Provider Last Rate Last Dose  . levalbuterol (XOPENEX) nebulizer solution 0.63 mg  0.63 mg Nebulization Once Parrett, Tammy S, NP       Past Medical History:  Diagnosis Date  . Acute sinusitis, unspecified   . Anemia    "quite a few times"  . Anxiety state, unspecified   . Arachnoiditis BILATERAL LEGS   DUE TO MULTIPLE BACK SURG.'S  . Arthritis   . Asthma     last flare up was 03/2017 lasted over a month  . Blood transfusion   . Cancer ([place])    skin cancers (in scalp)  . Cardiomyopathy HX --06/2010   EF was 25% during acute illness (PHELONEPHRITIS) Repeat echo 12-06-10 60% showed normal EF.   Marland Kitchen Chronic back pain greater than 3 months duration    S/P BACK SURG'S  . CSF leak   . Diabetes mellitus without complication ([place])    dx 2013 type 2  . Dyslipidemia   . Dysphagia    some post op cerv fusion 2/14  . Dysrhythmia   . Essential hypertension, benign   . Family history of adverse reaction to anesthesia    mother gets n/v  . GERD (gastroesophageal reflux disease) AND HIATIAL HERNIA   CONTROLLED W/ NEXIUM  . Headache(784.0)   . Heart murmur    DENIES S & S   (ECHO JUN'12 W/ CHART)  . History of chronic bronchitis   . Hx of bladder infections   . Hypertension   . Murmur, heart   . Neuromuscular disorder (HCC)    numbness and tingling  . Osteoporosis   . Other malaise and fatigue   . PONV (postoperative nausea and vomiting)   . Shortness of breath   . Spinal headache   . Spinal stenosis, cervical region   . Varicosities    venous  . Weakness of both legs DUE TO ARACHNOIDITIS   OCCASIONAL USES CANE   Past Surgical History:  Procedure Laterality Date  . ABDOMINAL HYSTERECTOMY  1987   W/ BSO  . ANTERIOR CERVICAL DECOMP/DISCECTOMY FUSION N/A 08/09/2012   Procedure: ANTERIOR CERVICAL DECOMPRESSION/DISCECTOMY FUSION 1 LEVEL;  Surgeon: Floyce Stakes, MD;  [facility];  Service: Neurosurgery;  Laterality: N/A;  Cervical four-five  Anterior cervical decompression/diskectomy/fusion  . ANTERIOR FUSION CERVICAL SPINE  2007   C5 -6  . APPENDECTOMY    . APPLICATION OF INTRAOPERATIVE CT SCAN N/A 05/17/2017   Procedure: APPLICATION OF INTRAOPERATIVE CT SCAN;  Surgeon: Erline Levine, MD;  Location: [place];  Service: Neurosurgery;  Laterality: N/A;  . BACK SURGERY     x 5  . BREAST EXCISIONAL BIOPSY Bilateral    No scar seen    . BREAST SURGERY     x 2 biopsies  . CARPAL TUNNEL RELEASE  RIGHT - 2001  & DEC 2011 W/ BACK SURG.  . CERVICAL DISC SURGERY  2005   C5 - 6  . CHOLECYSTECTOMY  1994  . DORSAL COMPARTMENT RELEASE Right 04/07/2013   Procedure: RIGHT WRIST STS RELEASE;  Surgeon: Schuyler Amor, MD;  Location:   SUR[facility]ERY CENTER;  Service: Orthopedics;  Laterality: Right;  . EXPLORATION OF INCISION FOR CSF LEAK  DEC 2011  X2   POST LAMINECOTMY  . FINGER ARTHRODESIS Right 04/07/2013   Procedure: RIGHT INDEX AND RIGHT LONG DISTAL INTERPHALANGEAL JOINT FUSIONS;  Surgeon: Schuyler Amor, MD;  Location: [place] [facility];  Service: Orthopedics;  Laterality: Right;  . FINGER ARTHROPLASTY Right 04/07/2013   Procedure: RIGHT THUMB [place] ARTHROPLASTY;  Surgeon: Schuyler Amor, MD;  Location: [facility];  Service: Orthopedics;  Laterality: Right;  . GANGLION CYST EXCISION Right 04/07/2013   Procedure: RIGHT WRIST MASS EXCISION;  Surgeon: Schuyler Amor, MD;  Location: [place] [facility];  Service: Orthopedics;  Laterality: Right;  . KNEE ARTHROSCOPY  LEFT X3 (LAST ONE 2005)  . KNEE ARTHROSCOPY  05/17/2011   Procedure: ARTHROSCOPY KNEE;  Surgeon: Bradley Ferris III;  Location: [facility];  Service: Orthopedics;  Laterality: Right;  WITH MEDIAL MENISECTOMY AND removal of suprapatella fat lump  . LEFT WRIST TENOSYNECTOMY W/ LEFT THUMB JOINT REPAIR  02-24-10  . LUMBAR FUSION  11-30-10   L4 - 5  . LUMBAR FUSION  2000   L2 - 4  . LUMBAR LAMINECTOMY  DEC 2011   L4 - 5  . POSTERIOR CERVICAL FUSION/FORAMINOTOMY N/A 05/17/2017   Procedure: Cervical five  to Thorastic one  Posterior cervical fusion with lateral mass screws/revision of prior instrumentation;  Surgeon: Erline Levine, MD;  Location: [facility];  Service: Neurosurgery;  Laterality: N/A;  C5 to T1 Posterior cervical fusion with lateral mass screws/revision of prior instrumentation  . TENDON REPAIR  JAN 2010    LEFT INDEX AND LONG FINGERS  . TUBAL LIGATION     Social History   Socioeconomic History  . Marital status: Divorced    Spouse name: Not on file  . Number of children: Not on file  . Years of education: Not on file  . Highest education level: Not on file  Occupational History  . Occupation: disable    Employer: DISABLED  Social Needs  . Financial resource strain: Not on file  . Food insecurity:    Worry: Not on file    Inability: Not on file  . Transportation needs:    Medical: Not on file    Non-medical: Not on file  Tobacco Use  . Smoking status: Never Smoker  . Smokeless tobacco: Never Used  Substance and Sexual Activity  . Alcohol use: No  . Drug use: No  . Sexual activity: Not on file    Comment: second hand smoke  Lifestyle  . Physical activity:    Days per week: Not on file    Minutes per session: Not on file  . Stress: Not on file  Relationships  . Social connections:    Talks on phone: Not on file    Gets together: Not on file    Attends religious service: Not on file    Active member of club or organization: Not on file    Attends meetings of clubs or organizations: Not on file    Relationship status: Not on file  Other Topics Concern  . Not on file  Social History Narrative   Has one child   Disabled   Family History  Problem Relation Age of Onset  . Hypertension Mother   . Heart disease Mother   . Diabetes Mother   . Cancer Father   . Diabetes Brother  Review of Systems: Pertinent positive and negative review of systems were noted in the above HPI section. All other review of systems were otherwise negative.    Physical Exam: Telemedicine - not performed    Assessment and Recommendations:  1.  Dysphagia primarily to solids, occasionally choking with liquids. Symptoms began after cervical spine fusion in November 2018.   Suspect she has oropharyngeal dysphagia related to her cervical spine fusion surgery.  Rule out esophageal  stricture, esophagitis and other disorders.  CBC, CMP, TSH.  Schedule barium esophagram.  Schedule EGD.  Might need further evaluation with a MBSS. The risks (including bleeding, perforation, infection, missed lesions, medication reactions and possible hospitalization or surgery if complications occur), benefits, and alternatives to endoscopy with possible biopsy and possible dilation were discussed with the patient and they consent to proceed.   2. GERD. She has ongoing reflux symptoms, throat burning however these symptoms did not respond to Nexium 40 mg twice daily for 3 months - question the contribution of reflux to these symptoms. Continue pantoprazole 40 mg daily and follow antireflux measures.  Pending findings on barium esophagram and endoscopy may escalate acid suppression therapy.    These services were provided via telemedicine, audio only per patient request.  The patient was at home and the provider was in the office, alone.  We discussed the limitations of evaluation and management by telemedicine and the availability of in person appointments.  Patient consented for this telemedicine visit and is aware of possible charges for this service.  The other person participating in the telemedicine service was Marlon Pel, [place] who reviewed medications, allergies, past history and completed AVS.  Time spent on call: 15 minutes     cc: Brand Males, MD [place], [place] [id number]

## 2018-10-23 NOTE — Patient Instructions (Addendum)
Your provider has requested that you go to the basement level for lab work at your convinience. Press "B" on the elevator. The lab is located at the first door on the left as you exit the elevator.  [facility] will contact you directly to schedule your Barium Esophagram.   You have been scheduled for an endoscopy. Please follow written instructions given to you at your visit today. If you use inhalers (even only as needed), please bring them with you on the day of your procedure.  Thank you for choosing me and [facility].  Pricilla Riffle. Dagoberto Ligas., MD., [facility]

## 2018-10-30 ENCOUNTER — Encounter: Payer: Medicare Other | Attending: Physical Medicine & Rehabilitation | Admitting: Registered Nurse

## 2018-10-30 ENCOUNTER — Other Ambulatory Visit: Payer: Self-pay

## 2018-10-30 ENCOUNTER — Encounter: Payer: Self-pay | Admitting: Registered Nurse

## 2018-10-30 DIAGNOSIS — M542 Cervicalgia: Secondary | ICD-10-CM | POA: Diagnosis not present

## 2018-10-30 DIAGNOSIS — F418 Other specified anxiety disorders: Secondary | ICD-10-CM

## 2018-10-30 DIAGNOSIS — M961 Postlaminectomy syndrome, not elsewhere classified: Secondary | ICD-10-CM | POA: Insufficient documentation

## 2018-10-30 DIAGNOSIS — G894 Chronic pain syndrome: Secondary | ICD-10-CM | POA: Diagnosis not present

## 2018-10-30 DIAGNOSIS — M5412 Radiculopathy, cervical region: Secondary | ICD-10-CM

## 2018-10-30 DIAGNOSIS — G039 Meningitis, unspecified: Secondary | ICD-10-CM | POA: Insufficient documentation

## 2018-10-30 DIAGNOSIS — M62838 Other muscle spasm: Secondary | ICD-10-CM

## 2018-10-30 DIAGNOSIS — Z981 Arthrodesis status: Secondary | ICD-10-CM

## 2018-10-30 DIAGNOSIS — M5441 Lumbago with sciatica, right side: Secondary | ICD-10-CM | POA: Insufficient documentation

## 2018-10-30 DIAGNOSIS — Z79891 Long term (current) use of opiate analgesic: Secondary | ICD-10-CM | POA: Diagnosis not present

## 2018-10-30 DIAGNOSIS — M5416 Radiculopathy, lumbar region: Secondary | ICD-10-CM | POA: Diagnosis not present

## 2018-10-30 DIAGNOSIS — Z5181 Encounter for therapeutic drug level monitoring: Secondary | ICD-10-CM | POA: Insufficient documentation

## 2018-10-30 DIAGNOSIS — Z79899 Other long term (current) drug therapy: Secondary | ICD-10-CM | POA: Insufficient documentation

## 2018-10-30 MED ORDER — GABAPENTIN 800 MG PO TABS: 800.0000 mg | ORAL_TABLET | Freq: Four times a day (QID) | ORAL | 5 refills | Status: DC

## 2018-10-30 MED ORDER — OXYMORPHONE HCL ER 40 MG PO T12A: 40.0000 mg | EXTENDED_RELEASE_TABLET | Freq: Two times a day (BID) | ORAL | 0 refills | Status: DC

## 2018-10-30 MED ORDER — OXYCODONE HCL 10 MG PO TABS: 10.0000 mg | ORAL_TABLET | Freq: Every day | ORAL | 0 refills | Status: DC | PRN

## 2018-10-30 NOTE — Progress Notes (Signed)
Subjective:    Patient ID: [name] [name], female    DOB: 1950-10-03, 68 y.o.   MRN: [id number]  HPI: [name] [name] is a 69 y.o. female her appointment was changed, due to national recommendations of social distancing due to [place] 19, an audio/video telehealth visit is felt to be most appropriate for this patient at this time.  See Chart message from today for the patient's consent to telehealth from [facility] [place].     She states her pain is located in her neck radiating into her bilateral shoulders and occassionally down her right arm with tingling, mid- lower back pain radiating into her buttocks and bilateral lower extremities R>L. Ms. Filion reports she had a F/U with Dr. Vertell Limber regarding her right lower extremity weakness, she has a scheduled F/U appointment. If her pain persists he will schedule her for a Myelogram, he is awaiting Ms. Paulding decision she reports.   She  rates her  pain 7. Her  current exercise regime is walking and performing stretching exercises.  Ms. Sauseda Morphine equivalent is 315.00 MME. She is also prescribed Diazepam by Dr. Sherrie Sport. We have discussed the black box warning of using opioids and benzodiazepines. I highlighted the dangers of using these drugs together and discussed the adverse events including respiratory suppression, overdose, cognitive impairment and importance of compliance with current regimen. We will continue to monitor and adjust as indicated.   Ms. Mulvihill last UDS was Performed on 07/18/2018, it was consistent.   Wenda Overland RN asked the Health and History Questions. This provider and Wenda Overland verified we were  speaking with the correct person using two identifiers.  Pain Inventory Average Pain 7 Pain Right Now 7 My pain is constant, sharp, burning, tingling and aching  In the last 24 hours, has pain interfered with the following? General activity 6 Relation with others 6 Enjoyment of life 8 What TIME of  day is your pain at its worst? all Sleep (in general) Poor  Pain is worse with: walking, sitting, standing and some activites Pain improves with: rest, heat/ice and medication Relief from Meds: 6  Mobility use a cane ability to climb steps?  yes do you drive?  yes  Function disabled: date disabled . I need assistance with the following:  meal prep, household duties and shopping  Neuro/Psych weakness numbness tremor tingling trouble walking spasms dizziness anxiety  Prior Studies Any changes since last visit?  no  Physicians involved in your care Any changes since last visit?  no   Family History  Problem Relation Age of Onset  . Hypertension Mother   . Heart disease Mother   . Diabetes Mother   . Cancer Father   . Diabetes Brother    Social History   Socioeconomic History  . Marital status: Divorced    Spouse name: Not on file  . Number of children: Not on file  . Years of education: Not on file  . Highest education level: Not on file  Occupational History  . Occupation: disable    Employer: DISABLED  Social Needs  . Financial resource strain: Not on file  . Food insecurity:    Worry: Not on file    Inability: Not on file  . Transportation needs:    Medical: Not on file    Non-medical: Not on file  Tobacco Use  . Smoking status: Never Smoker  . Smokeless tobacco: Never Used  Substance and Sexual Activity  . Alcohol use:  No  . Drug use: No  . Sexual activity: Not on file    Comment: second hand smoke  Lifestyle  . Physical activity:    Days per week: Not on file    Minutes per session: Not on file  . Stress: Not on file  Relationships  . Social connections:    Talks on phone: Not on file    Gets together: Not on file    Attends religious service: Not on file    Active member of club or organization: Not on file    Attends meetings of clubs or organizations: Not on file    Relationship status: Not on file  Other Topics Concern  . Not on  file  Social History Narrative   Has one child   Disabled   Past Surgical History:  Procedure Laterality Date  . ABDOMINAL HYSTERECTOMY  1987   W/ BSO  . ANTERIOR CERVICAL DECOMP/DISCECTOMY FUSION N/A 08/09/2012   Procedure: ANTERIOR CERVICAL DECOMPRESSION/DISCECTOMY FUSION 1 LEVEL;  Surgeon: Floyce Stakes, MD;  [facility];  Service: Neurosurgery;  Laterality: N/A;  Cervical four-five  Anterior cervical decompression/diskectomy/fusion  . ANTERIOR FUSION CERVICAL SPINE  2007   C5 -6  . APPENDECTOMY    . APPLICATION OF INTRAOPERATIVE CT SCAN N/A 05/17/2017   Procedure: APPLICATION OF INTRAOPERATIVE CT SCAN;  Surgeon: Erline Levine, MD;  Location: [place];  Service: Neurosurgery;  Laterality: N/A;  . BACK SURGERY     x 5  . BREAST EXCISIONAL BIOPSY Bilateral    No scar seen   . BREAST SURGERY     x 2 biopsies  . CARPAL TUNNEL RELEASE  RIGHT - 2001  & DEC 2011 W/ BACK SURG.  . CERVICAL DISC SURGERY  2005   C5 - 6  . CHOLECYSTECTOMY  1994  . DORSAL COMPARTMENT RELEASE Right 04/07/2013   Procedure: RIGHT WRIST STS RELEASE;  Surgeon: Schuyler Amor, MD;  Location: [facility];  Service: Orthopedics;  Laterality: Right;  . EXPLORATION OF INCISION FOR CSF LEAK  DEC 2011  X2   POST LAMINECOTMY  . FINGER ARTHRODESIS Right 04/07/2013   Procedure: RIGHT INDEX AND RIGHT LONG DISTAL INTERPHALANGEAL JOINT FUSIONS;  Surgeon: Schuyler Amor, MD;  Location: [facility];  Service: Orthopedics;  Laterality: Right;  . FINGER ARTHROPLASTY Right 04/07/2013   Procedure: RIGHT THUMB [place] ARTHROPLASTY;  Surgeon: Schuyler Amor, MD;  Location: [facility];  Service: Orthopedics;  Laterality: Right;  . GANGLION CYST EXCISION Right 04/07/2013   Procedure: RIGHT WRIST MASS EXCISION;  Surgeon: Schuyler Amor, MD;  Location: [facility];  Service: Orthopedics;  Laterality: Right;  . KNEE ARTHROSCOPY  LEFT X3 (LAST ONE 2005)  .  KNEE ARTHROSCOPY  05/17/2011   Procedure: ARTHROSCOPY KNEE;  Surgeon: Bradley Ferris III;  Location: [facility];  Service: Orthopedics;  Laterality: Right;  WITH MEDIAL MENISECTOMY AND removal of suprapatella fat lump  . LEFT WRIST TENOSYNECTOMY W/ LEFT THUMB JOINT REPAIR  02-24-10  . LUMBAR FUSION  11-30-10   L4 - 5  . LUMBAR FUSION  2000   L2 - 4  . LUMBAR LAMINECTOMY  DEC 2011   L4 - 5  . POSTERIOR CERVICAL FUSION/FORAMINOTOMY N/A 05/17/2017   Procedure: Cervical five  to Thorastic one  Posterior cervical fusion with lateral mass screws/revision of prior instrumentation;  Surgeon: Erline Levine, MD;  Location: [facility];  Service: Neurosurgery;  Laterality: N/A;  C5  to T1 Posterior cervical fusion with lateral mass screws/revision of prior instrumentation  . TENDON REPAIR  JAN 2010   LEFT INDEX AND LONG FINGERS  . TUBAL LIGATION     Past Medical History:  Diagnosis Date  . Acute sinusitis, unspecified   . Anemia    "quite a few times"  . Anxiety state, unspecified   . Arachnoiditis BILATERAL LEGS   DUE TO MULTIPLE BACK SURG.'S  . Arthritis   . Asthma    last flare up was 03/2017 lasted over a month  . Blood transfusion   . Cancer ([place])    skin cancers (in scalp)  . Cardiomyopathy HX --06/2010   EF was 25% during acute illness (PHELONEPHRITIS) Repeat echo 12-06-10 60% showed normal EF.   Marland Kitchen Chronic back pain greater than 3 months duration    S/P BACK SURG'S  . CSF leak   . Diabetes mellitus without complication ([place])    dx 2013 type 2  . Dyslipidemia   . Dysphagia    some post op cerv fusion 2/14  . Dysrhythmia   . Essential hypertension, benign   . Family history of adverse reaction to anesthesia    mother gets n/v  . GERD (gastroesophageal reflux disease) AND HIATIAL HERNIA   CONTROLLED W/ NEXIUM  . Headache(784.0)   . Heart murmur    DENIES S & S   (ECHO JUN'12 W/ CHART)  . History of chronic bronchitis   . Hx of bladder infections   . Hypertension    . Murmur, heart   . Neuromuscular disorder (HCC)    numbness and tingling  . Osteoporosis   . Other malaise and fatigue   . PONV (postoperative nausea and vomiting)   . Shortness of breath   . Spinal headache   . Spinal stenosis, cervical region   . Varicosities    venous  . Weakness of both legs DUE TO ARACHNOIDITIS   OCCASIONAL USES CANE   There were no vitals taken for this visit.  Opioid Risk Score:   Fall Risk Score:  `1  Depression screen PHQ 2/9  Depression screen [facility] 2/9 10/30/2018 08/14/2018 03/12/2018 10/25/2017 09/27/2017 07/02/2017 06/04/2017  Decreased Interest 0 0 0 0 0 1 1  Down, Depressed, Hopeless 0 - 0 0 0 1 1  PHQ - 2 Score 0 0 0 0 0 2 2  Altered sleeping - - - - 0 - -  Tired, decreased energy - - - - 0 - -  Change in appetite - - - - 0 - -  Feeling bad or failure about yourself  - - - - 0 - -  Trouble concentrating - - - - 0 - -  Moving slowly or fidgety/restless - - - - 0 - -  Suicidal thoughts - - - - 0 - -  PHQ-9 Score - - - - 0 - -  Some recent data might be hidden    Review of Systems  Constitutional: Negative.   HENT: Negative.   Eyes: Negative.   Respiratory: Negative.   Cardiovascular: Negative.   Gastrointestinal: Negative.   Endocrine: Negative.   Genitourinary: Negative.   Musculoskeletal: Positive for back pain and gait problem.       Spasms  Skin: Negative.   Allergic/Immunologic: Negative.   Neurological: Positive for dizziness, tremors, weakness and numbness.       Tingling  Hematological: Negative.   Psychiatric/Behavioral: The patient is nervous/anxious.   All other systems reviewed and are negative.  Objective:   Physical Exam Vitals signs and nursing note reviewed.  Musculoskeletal:     Comments: No Physical Exam Performed: Virtual Visit.  Neurological:     Mental Status: She is oriented to person, place, and time.           Assessment & Plan:  1. On 05/17/2017 :C5C6C7 T1 Posterior Cervical Fusion with  lateral mass fixation with [facility], revision of prior instrumentation. By Dr. Vertell Limber.  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 Continuecurrent medication regimen withGabapentin800 mg QID.Marland KitchenContinue:Oxycodone 15mg  one tablet every 6 hours as needed # 120 and Oxymorphone HCL 40 mg every 12 hours #60. 10/30/2018 We will continue the opioid monitoring program, this consists of regular clinic visits, examinations, urine drug screen, pill counts as well as use of [place] Controlled Substance Reporting System. 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 10/30/2018 3. Anxiety/depression: Continue Valium, PCP Following. ContinueCymbalta. Continueto monitor.10/30/2018 4. Muscle Spasms: Continuecurrent medication regime withFlexeril. Continue to Monitor.10/30/2018 5. Cervicalgia/ Cervical Radiculitis: Continuecurrent medication regime withGabapentin: Dr. Vertell Limber Following: S/P C5C6C7 T1 Posterior Cervical Fusion with lateral mass fixation with [facility], revision of prior instrumentation by Dr. Vertell Limber on 05/17/2017. 10/30/2018 7. Bilateral Thoracic Back Pain: Continue HEP as Tolerated and Continue current medication regimen. Continue to Monitor.10/30/2018 8. Left lower extremity DVT/ Phlebitis: S/P endovenous laser ablation of left great saphenous vein on 05/09/2018 by Dr. Scot Dock: Vascular Following   Telephone Call  Location of patient: In her Home Location of provider: Office Established patient Time spent on call: 21 Minutes

## 2018-11-01 ENCOUNTER — Other Ambulatory Visit: Payer: Medicare Other

## 2018-11-04 ENCOUNTER — Encounter: Payer: Medicare Other | Admitting: Gastroenterology

## 2018-11-05 ENCOUNTER — Telehealth: Payer: Self-pay | Admitting: Gastroenterology

## 2018-11-05 NOTE — Telephone Encounter (Signed)
We can proceed with EGD as the first step as she declines barium swallow.

## 2018-11-05 NOTE — Telephone Encounter (Signed)
Patient does not feel that she can tolerate the barium for the BS.  She wanted you to know that she will try if EGD has a finding that requires a BS, but does not feel that she can do it at this time.

## 2018-11-05 NOTE — Telephone Encounter (Signed)
Pt stated that Dr. Fuller Plan discussed a Barium Swallow and blood test before EGD, but pt reported that she is unable to go Barium swallow because of the taste.  Pt would like a call back to discuss.

## 2018-11-13 ENCOUNTER — Ambulatory Visit: Payer: Medicare Other | Admitting: Physical Medicine & Rehabilitation

## 2018-11-15 ENCOUNTER — Telehealth: Payer: Self-pay | Admitting: *Deleted

## 2018-11-15 NOTE — Telephone Encounter (Signed)
Covid-19 screening questions  Have you traveled in the last 14 days? no If yes where?  Do you now or have you had a fever in the last 14 days? no  Do you have any respiratory symptoms of shortness of breath or cough now or in the last 14 days? no  Do you have any family members or close contacts with diagnosed or suspected Covid-19 in the past 14 days?  no  Have you been tested for Covid-19 and found to be positive? No,  Patient was tested in March and came back negative.   Pt advised that her care partner would need to wait in the car but would need to stay on the premises.

## 2018-11-15 NOTE — Telephone Encounter (Signed)
LM on VM for patient to call

## 2018-11-18 ENCOUNTER — Encounter: Payer: Medicare Other | Admitting: Gastroenterology

## 2018-11-18 ENCOUNTER — Telehealth: Payer: Self-pay | Admitting: Gastroenterology

## 2018-11-27 ENCOUNTER — Encounter: Payer: Self-pay | Admitting: Physical Medicine & Rehabilitation

## 2018-11-27 ENCOUNTER — Encounter: Payer: Medicare Other | Attending: Physical Medicine & Rehabilitation | Admitting: Physical Medicine & Rehabilitation

## 2018-11-27 ENCOUNTER — Other Ambulatory Visit: Payer: Self-pay

## 2018-11-27 DIAGNOSIS — M5412 Radiculopathy, cervical region: Secondary | ICD-10-CM

## 2018-11-27 DIAGNOSIS — M5441 Lumbago with sciatica, right side: Secondary | ICD-10-CM | POA: Insufficient documentation

## 2018-11-27 DIAGNOSIS — M961 Postlaminectomy syndrome, not elsewhere classified: Secondary | ICD-10-CM | POA: Insufficient documentation

## 2018-11-27 DIAGNOSIS — Z5181 Encounter for therapeutic drug level monitoring: Secondary | ICD-10-CM | POA: Insufficient documentation

## 2018-11-27 DIAGNOSIS — G039 Meningitis, unspecified: Secondary | ICD-10-CM

## 2018-11-27 DIAGNOSIS — Z79899 Other long term (current) drug therapy: Secondary | ICD-10-CM | POA: Insufficient documentation

## 2018-11-27 MED ORDER — OXYMORPHONE HCL ER 40 MG PO T12A: 40.0000 mg | EXTENDED_RELEASE_TABLET | Freq: Two times a day (BID) | ORAL | 0 refills | Status: DC

## 2018-11-27 MED ORDER — OXYCODONE HCL 10 MG PO TABS: 10.0000 mg | ORAL_TABLET | Freq: Every day | ORAL | 0 refills | Status: DC | PRN

## 2018-11-27 NOTE — Progress Notes (Signed)
Due to national recommendations of social distancing because of COVID 51, an audio/video tele-health visit is felt to be the most appropriate encounter for this patient at this time. See MyChart message from today for the patient's consent to a tele-health encounter with [place]. This is a follow up telephone visit for the patient who is at home. MD is at office.    I am meeting with the patient today regarding her chronic pain.  Her pain levels have been a little higher since she fell in [place] when her dog tugged on the leash. This happened in March. She fell against a door and jarred her head and neck. She has been seen NS in follow up. Nothing was broken. The pain has primarily been in mid to low back and to a lesser extent in her neck/arms.   She continues on oxycodone as prescribed as well as oxymorphone. She is tolerating the 10mg  oxycodone but feels the difference compared to the 15mg  dose she was previously on. She mantains on gabapentin and cymbalta as prescribed.         Assessment & Plan:  1. On 05/17/2017 :C5C6C7 T1 Posterior Cervical Fusion with lateral mass fixation with AIRO Imaging, revision of prior instrumentation. By Dr. Vertell Limber.  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    -Gabapentin800 mg QID..   -Oxycodone 15mg  one tablet every 6 hours as needed # 120 and Oxymorphone HCL 40 mg every 12 hours #60.    -We will continue the controlled substance monitoring program, this consists of regular clinic visits, examinations, routine drug screening, pill counts as well as use of [place] Controlled Substance Reporting System. [place] today.   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   -encouraged activity/ROM to tolerance  -follow up with NS for persistent back and leg sx.   -I would assume her fall exacerbated this 3.  Anxiety/depression: Continue Valium, PCP Following. ContinueCymbalta at 30mg  daily  -encouraged her to use heating pad at night to help sleep.   -won't treat sleep affected by pain with sleep med  4. Muscle Spasms:  No flexeril d/t GI upset.   5. Cervicalgia/ Cervical Radiculitis:  : Dr. Vertell Limber Following: S/P C5C6C7 T1 Posterior Cervical Fusion with lateral mass fixation with AIRO Imaging, revision of prior instrumentation by Dr. Vertell Limber on 05/17/2017.    -gabapentin   -ROM exercises discussed here as well..  7. Bilateral Thoracic Back Pain:Continue HEP as Tolerated and Continue current medication regimen.   8. Left lower extremity DVT/ Phlebitis: S/P endovenous laser ablation of left great saphenous vein on 05/09/2018 by Dr. Scot Dock: Vascular Following  11 minutes of tele-visit time was spent with this patient today. Follow up in 1 months with NP

## 2018-12-05 DIAGNOSIS — R635 Abnormal weight gain: Secondary | ICD-10-CM | POA: Diagnosis not present

## 2018-12-05 DIAGNOSIS — Z9889 Other specified postprocedural states: Secondary | ICD-10-CM | POA: Diagnosis not present

## 2018-12-05 DIAGNOSIS — Z7984 Long term (current) use of oral hypoglycemic drugs: Secondary | ICD-10-CM | POA: Diagnosis not present

## 2018-12-05 DIAGNOSIS — Z888 Allergy status to other drugs, medicaments and biological substances status: Secondary | ICD-10-CM | POA: Diagnosis not present

## 2018-12-05 DIAGNOSIS — M810 Age-related osteoporosis without current pathological fracture: Secondary | ICD-10-CM | POA: Diagnosis not present

## 2018-12-05 DIAGNOSIS — M542 Cervicalgia: Secondary | ICD-10-CM | POA: Diagnosis not present

## 2018-12-05 DIAGNOSIS — Z886 Allergy status to analgesic agent status: Secondary | ICD-10-CM | POA: Diagnosis not present

## 2018-12-05 DIAGNOSIS — E119 Type 2 diabetes mellitus without complications: Secondary | ICD-10-CM | POA: Diagnosis not present

## 2018-12-05 DIAGNOSIS — Z6834 Body mass index (BMI) 34.0-34.9, adult: Secondary | ICD-10-CM | POA: Diagnosis not present

## 2018-12-05 DIAGNOSIS — T402X1A Poisoning by other opioids, accidental (unintentional), initial encounter: Secondary | ICD-10-CM | POA: Diagnosis not present

## 2018-12-05 DIAGNOSIS — Z7982 Long term (current) use of aspirin: Secondary | ICD-10-CM | POA: Diagnosis not present

## 2018-12-05 DIAGNOSIS — J9600 Acute respiratory failure, unspecified whether with hypoxia or hypercapnia: Secondary | ICD-10-CM | POA: Diagnosis not present

## 2018-12-05 DIAGNOSIS — J45909 Unspecified asthma, uncomplicated: Secondary | ICD-10-CM | POA: Diagnosis not present

## 2018-12-05 DIAGNOSIS — R410 Disorientation, unspecified: Secondary | ICD-10-CM | POA: Diagnosis not present

## 2018-12-05 DIAGNOSIS — I10 Essential (primary) hypertension: Secondary | ICD-10-CM | POA: Diagnosis not present

## 2018-12-05 DIAGNOSIS — T400X1A Poisoning by opium, accidental (unintentional), initial encounter: Secondary | ICD-10-CM | POA: Diagnosis not present

## 2018-12-05 DIAGNOSIS — Z20828 Contact with and (suspected) exposure to other viral communicable diseases: Secondary | ICD-10-CM | POA: Diagnosis not present

## 2018-12-05 DIAGNOSIS — R0902 Hypoxemia: Secondary | ICD-10-CM | POA: Diagnosis not present

## 2018-12-05 DIAGNOSIS — R7989 Other specified abnormal findings of blood chemistry: Secondary | ICD-10-CM | POA: Diagnosis not present

## 2018-12-05 DIAGNOSIS — T40604A Poisoning by unspecified narcotics, undetermined, initial encounter: Secondary | ICD-10-CM | POA: Diagnosis not present

## 2018-12-05 DIAGNOSIS — K219 Gastro-esophageal reflux disease without esophagitis: Secondary | ICD-10-CM | POA: Diagnosis not present

## 2018-12-05 DIAGNOSIS — R531 Weakness: Secondary | ICD-10-CM | POA: Diagnosis not present

## 2018-12-05 DIAGNOSIS — G8929 Other chronic pain: Secondary | ICD-10-CM | POA: Diagnosis not present

## 2018-12-05 DIAGNOSIS — M545 Low back pain: Secondary | ICD-10-CM | POA: Diagnosis not present

## 2018-12-05 DIAGNOSIS — Z79899 Other long term (current) drug therapy: Secondary | ICD-10-CM | POA: Diagnosis not present

## 2018-12-05 DIAGNOSIS — I517 Cardiomegaly: Secondary | ICD-10-CM | POA: Diagnosis not present

## 2018-12-05 DIAGNOSIS — I959 Hypotension, unspecified: Secondary | ICD-10-CM | POA: Diagnosis not present

## 2018-12-05 DIAGNOSIS — J9602 Acute respiratory failure with hypercapnia: Secondary | ICD-10-CM | POA: Diagnosis not present

## 2018-12-05 DIAGNOSIS — T50995A Adverse effect of other drugs, medicaments and biological substances, initial encounter: Secondary | ICD-10-CM | POA: Diagnosis not present

## 2018-12-05 DIAGNOSIS — F411 Generalized anxiety disorder: Secondary | ICD-10-CM | POA: Diagnosis not present

## 2018-12-05 DIAGNOSIS — E872 Acidosis: Secondary | ICD-10-CM | POA: Diagnosis not present

## 2018-12-05 DIAGNOSIS — J189 Pneumonia, unspecified organism: Secondary | ICD-10-CM | POA: Diagnosis not present

## 2018-12-06 DIAGNOSIS — T400X1A Poisoning by opium, accidental (unintentional), initial encounter: Secondary | ICD-10-CM | POA: Diagnosis not present

## 2018-12-06 DIAGNOSIS — G8929 Other chronic pain: Secondary | ICD-10-CM | POA: Diagnosis not present

## 2018-12-06 DIAGNOSIS — Z9889 Other specified postprocedural states: Secondary | ICD-10-CM | POA: Diagnosis not present

## 2018-12-06 DIAGNOSIS — J9602 Acute respiratory failure with hypercapnia: Secondary | ICD-10-CM | POA: Diagnosis not present

## 2018-12-06 DIAGNOSIS — M545 Low back pain: Secondary | ICD-10-CM | POA: Diagnosis not present

## 2018-12-09 ENCOUNTER — Telehealth: Payer: Self-pay | Admitting: Gastroenterology

## 2018-12-09 NOTE — Telephone Encounter (Signed)
Hello patient has canceled procedure for 12/10/2018. Spoke with patient she stated she was admitted to hospital on thursday with  a kidney infection she will call us back once she is able to reschedule.

## 2018-12-10 ENCOUNTER — Encounter: Payer: Medicare Other | Admitting: Gastroenterology

## 2018-12-23 ENCOUNTER — Telehealth: Payer: Self-pay | Admitting: Internal Medicine

## 2018-12-23 NOTE — Telephone Encounter (Signed)
Called and spoke to patient. Patient called to ask questions about her recent hospitalization. Patient reported that she went to [facility] on 12/06/2018 in respiratory failure.  Patient reported that her CO2 was elevated and would like to talk with a provider about the causes for this. Patient stated at the hospital they mentioned to her that her pain medications could have contributed. Patient shared that she is going to have an appointment with pain management soon but still would like to talk with NP.  Scheduled patient for televisit with Lazaro Arms, NP for 12/24/2018.  Nothing further needed.

## 2018-12-24 ENCOUNTER — Other Ambulatory Visit: Payer: Self-pay

## 2018-12-24 ENCOUNTER — Ambulatory Visit (INDEPENDENT_AMBULATORY_CARE_PROVIDER_SITE_OTHER): Payer: Medicare Other | Admitting: Nurse Practitioner

## 2018-12-24 DIAGNOSIS — J4530 Mild persistent asthma, uncomplicated: Secondary | ICD-10-CM | POA: Diagnosis not present

## 2018-12-24 NOTE — Progress Notes (Signed)
Virtual Visit via Telephone Note  I connected with [name] [name] on 12/25/18 at 10:00 AM EDT by telephone and verified that I am speaking with the correct person using two identifiers.  Location: Patient: home Provider: office   I discussed the limitations, risks, security and privacy concerns of performing an evaluation and management service by telephone and the availability of in person appointments. I also discussed with the patient that there may be a patient responsible charge related to this service. The patient expressed understanding and agreed to proceed.   History of Present Illness: 68 year old female never smoker followed for asthma  Patient has a tele-visit today for hospital follow-up.  Patient states that she was admitted to [facility] on 12/06/2018 for respiratory failure.  We requested records from this visit.  Patient states that the ED doctor told her this was due to intentional overdose from narcotics.  Patient does see pain management and is currently on oxymorphone, oxycodone, gabapentin, Cymbalta, Valium.  She does have an appointment with pain management tomorrow and is hoping to be able to wean off of some of her medications.  Ready stopped Cymbalta and has decreased gabapentin to once daily.  Patient states that she has been doing well since hospital discharge.  She is not currently on any oxygen.  She denies any significant shortness of breath or cough.  She is compliant with Symbicort. Denies f/c/s, n/v/d, hemoptysis, PND, leg swelling.     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 HRCT 04/11/18 - No findings to suggest interstitial lung disease. Moderate air trapping indicative of small airways disease. 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 Innumerable tiny 1-2 mm pulmonary nodules scattered throughout the lungs bilaterally,  stable dating back to 2017, considered definitively benign.    Assessment and Plan: Patient has a tele-visit today for hospital follow-up.  Patient states that she was admitted to [facility] on 12/06/2018 for respiratory failure.  We requested records from this visit.  Patient states that the ED doctor told her this was due to intentional overdose from narcotics.  Patient does see pain management and is currently on oxymorphone, oxycodone, gabapentin, Cymbalta, Valium.  She does have an appointment with pain management tomorrow and is hoping to be able to wean off of some of her medications.  Ready stopped Cymbalta and has decreased gabapentin to once daily.  Patient states that she has been doing well since hospital discharge.  She denies any significant shortness of breath or cough.   Patient Instructions  Continue symbicort Please keep follow up appointment with pain management   Note: requested records from ED visit. Will review and scan into chart.    Follow Up Instructions:  Follow up with Dr. Chase Caller at his first available   I discussed the assessment and treatment plan with the patient. The patient was provided an opportunity to ask questions and all were answered. The patient agreed with the plan and demonstrated an understanding of the instructions.   The patient was advised to call back or seek an in-person evaluation if the symptoms worsen or if the condition fails to improve as anticipated.  I provided 22 minutes of non-face-to-face time during this encounter.   Fenton Foy, NP

## 2018-12-25 ENCOUNTER — Encounter: Payer: Self-pay | Admitting: Registered Nurse

## 2018-12-25 ENCOUNTER — Other Ambulatory Visit: Payer: Self-pay

## 2018-12-25 ENCOUNTER — Encounter: Payer: Self-pay | Admitting: Nurse Practitioner

## 2018-12-25 ENCOUNTER — Encounter: Payer: Medicare Other | Attending: Physical Medicine & Rehabilitation | Admitting: Registered Nurse

## 2018-12-25 VITALS — BP 143/82 | HR 101 | Temp 97.7°F | Resp 14 | Ht 65.0 in | Wt 180.0 lb

## 2018-12-25 DIAGNOSIS — M5416 Radiculopathy, lumbar region: Secondary | ICD-10-CM

## 2018-12-25 DIAGNOSIS — M62838 Other muscle spasm: Secondary | ICD-10-CM

## 2018-12-25 DIAGNOSIS — G039 Meningitis, unspecified: Secondary | ICD-10-CM | POA: Diagnosis not present

## 2018-12-25 DIAGNOSIS — G894 Chronic pain syndrome: Secondary | ICD-10-CM

## 2018-12-25 DIAGNOSIS — F418 Other specified anxiety disorders: Secondary | ICD-10-CM | POA: Diagnosis not present

## 2018-12-25 DIAGNOSIS — Z79891 Long term (current) use of opiate analgesic: Secondary | ICD-10-CM

## 2018-12-25 DIAGNOSIS — M542 Cervicalgia: Secondary | ICD-10-CM

## 2018-12-25 DIAGNOSIS — Z79899 Other long term (current) drug therapy: Secondary | ICD-10-CM | POA: Insufficient documentation

## 2018-12-25 DIAGNOSIS — M961 Postlaminectomy syndrome, not elsewhere classified: Secondary | ICD-10-CM | POA: Insufficient documentation

## 2018-12-25 DIAGNOSIS — M5441 Lumbago with sciatica, right side: Secondary | ICD-10-CM | POA: Insufficient documentation

## 2018-12-25 DIAGNOSIS — M5412 Radiculopathy, cervical region: Secondary | ICD-10-CM | POA: Diagnosis not present

## 2018-12-25 DIAGNOSIS — Z5181 Encounter for therapeutic drug level monitoring: Secondary | ICD-10-CM | POA: Insufficient documentation

## 2018-12-25 MED ORDER — OXYMORPHONE HCL ER 40 MG PO T12A: 40.0000 mg | EXTENDED_RELEASE_TABLET | Freq: Two times a day (BID) | ORAL | 0 refills | Status: DC

## 2018-12-25 MED ORDER — OXYCODONE HCL 10 MG PO TABS: 10.0000 mg | ORAL_TABLET | Freq: Every day | ORAL | 0 refills | Status: DC | PRN

## 2018-12-25 NOTE — Patient Instructions (Signed)
Continue symbicort Please keep follow up appointment with pain management   Follow up: Follow up with Dr. Chase Caller at his first available

## 2018-12-25 NOTE — Patient Instructions (Addendum)
We will begin to Wean Gabapentin Today:   The goal will be to decrease to three times a day  Breakfast, Late Lunch and Bedtime  We will began a slow wean of Oxycodone: Goal will be  To decrease to 4 times a day.  I will decrease your tablets today to 135 tablets.   Send A My-chart next week to let me know how you are doing.

## 2018-12-25 NOTE — Progress Notes (Signed)
Subjective:    Patient ID: [name] [name], female    DOB: March 21, 1951, 68 y.o.   MRN: [id number]  HPI: [name] [name] is a 68 y.o. female who returns for follow up appointment for chronic pain and medication refill. She states her pain is located in her neck radiating into her bilateral shoulders and lower back pain radiating into her bilateral lower extremities. She rates her  Pain 8. Her current exercise regime is walking and pool therapy.   [name] [name] reports she was admitted to [facility] on June 19th with acute respiratory failure, she asked if she can began weaning her analgesics. We discussed weaning her Oxymorphone, [name] [name] reports her pain is being relieved with the Oxymorphone. This provider have discussed with [name] [name] on many occassions relating to began a slow weaning of her analgesics, she wasn't ready to begin the weaning process, she also wanted to discussed with Dr. Naaman Plummer prior to beginning a weaning of her analgesics. Today we will began a slow weaning of her Oxycodone, her tablets was decreased to 135 tablets. We also will began a slow weaning of her gabapentin, she was instructed to send a My-Chart message in a week to evaluate medication changes, she verbalizes understanding.   [name] [name] Morphine equivalent is 315.00 MME. She is also prescribed Diazepam by Dr. Sherrie Sport. We have discussed the black box warning of using opioids and benzodiazepines. I highlighted the dangers of using these drugs together and discussed the adverse events including respiratory suppression, overdose, cognitive impairment and importance of compliance with current regimen. We will continue to monitor and adjust as indicated.     UDS was Performed today.   Pain Inventory Average Pain 7 Pain Right Now 8 My pain is sharp, burning, stabbing and tingling  In the last 24 hours, has pain interfered with the following? General activity 6 Relation with others 5 Enjoyment of life 6 What TIME of day is  your pain at its worst? all the time Sleep (in general) Fair  Pain is worse with: walking, sitting, inactivity and standing Pain improves with: rest, heat/ice and medication Relief from Meds: 7  Mobility use a cane use a walker ability to climb steps?  yes do you drive?  yes  Function Do you have any goals in this area?  no  Neuro/Psych n/a  Prior Studies x-rays  Physicians involved in your care Primary care n/a   Family History  Problem Relation Age of Onset  . Hypertension Mother   . Heart disease Mother   . Diabetes Mother   . Cancer Father   . Diabetes Brother    Social History   Socioeconomic History  . Marital status: Divorced    Spouse name: Not on file  . Number of children: Not on file  . Years of education: Not on file  . Highest education level: Not on file  Occupational History  . Occupation: disable    Employer: DISABLED  Social Needs  . Financial resource strain: Not on file  . Food insecurity    Worry: Not on file    Inability: Not on file  . Transportation needs    Medical: Not on file    Non-medical: Not on file  Tobacco Use  . Smoking status: Never Smoker  . Smokeless tobacco: Never Used  Substance and Sexual Activity  . Alcohol use: No  . Drug use: No  . Sexual activity: Not on file    Comment: second hand smoke  Lifestyle  . Physical activity    Days per week: Not on file    Minutes per session: Not on file  . Stress: Not on file  Relationships  . Social Herbalist on phone: Not on file    Gets together: Not on file    Attends religious service: Not on file    Active member of club or organization: Not on file    Attends meetings of clubs or organizations: Not on file    Relationship status: Not on file  Other Topics Concern  . Not on file  Social History Narrative   Has one child   Disabled   Past Surgical History:  Procedure Laterality Date  . ABDOMINAL HYSTERECTOMY  1987   W/ BSO  . ANTERIOR CERVICAL  DECOMP/DISCECTOMY FUSION N/A 08/09/2012   Procedure: ANTERIOR CERVICAL DECOMPRESSION/DISCECTOMY FUSION 1 LEVEL;  Surgeon: Floyce Stakes, MD;  Location: [facility];  Service: Neurosurgery;  Laterality: N/A;  Cervical four-five  Anterior cervical decompression/diskectomy/fusion  . ANTERIOR FUSION CERVICAL SPINE  2007   C5 -6  . APPENDECTOMY    . APPLICATION OF INTRAOPERATIVE CT SCAN N/A 05/17/2017   Procedure: APPLICATION OF INTRAOPERATIVE CT SCAN;  Surgeon: Erline Levine, MD;  Location: [place];  Service: Neurosurgery;  Laterality: N/A;  . BACK SURGERY     x 5  . BREAST EXCISIONAL BIOPSY Bilateral    No scar seen   . BREAST SURGERY     x 2 biopsies  . CARPAL TUNNEL RELEASE  RIGHT - 2001  & DEC 2011 W/ BACK SURG.  . CERVICAL DISC SURGERY  2005   C5 - 6  . CHOLECYSTECTOMY  1994  . DORSAL COMPARTMENT RELEASE Right 04/07/2013   Procedure: RIGHT WRIST STS RELEASE;  Surgeon: Schuyler Amor, MD;  Location: [place];  Service: Orthopedics;  Laterality: Right;  . EXPLORATION OF INCISION FOR CSF LEAK  DEC 2011  X2   POST LAMINECOTMY  . FINGER ARTHRODESIS Right 04/07/2013   Procedure: RIGHT INDEX AND RIGHT LONG DISTAL INTERPHALANGEAL JOINT FUSIONS;  Surgeon: Schuyler Amor, MD;  Location: [place];  Service: Orthopedics;  Laterality: Right;  . FINGER ARTHROPLASTY Right 04/07/2013   Procedure: RIGHT THUMB [place] ARTHROPLASTY;  Surgeon: Schuyler Amor, MD;  Location: [place];  Service: Orthopedics;  Laterality: Right;  . GANGLION CYST EXCISION Right 04/07/2013   Procedure: RIGHT WRIST MASS EXCISION;  Surgeon: Schuyler Amor, MD;  Location: [place];  Service: Orthopedics;  Laterality: Right;  . KNEE ARTHROSCOPY  LEFT X3 (LAST ONE 2005)  . KNEE ARTHROSCOPY  05/17/2011   Procedure: ARTHROSCOPY KNEE;  Surgeon: Bradley Ferris III;  Location: [place];  Service: Orthopedics;  Laterality: Right;  WITH  MEDIAL MENISECTOMY AND removal of suprapatella fat lump  . LEFT WRIST TENOSYNECTOMY W/ LEFT THUMB JOINT REPAIR  02-24-10  . LUMBAR FUSION  11-30-10   L4 - 5  . LUMBAR FUSION  2000   L2 - 4  . LUMBAR LAMINECTOMY  DEC 2011   L4 - 5  . POSTERIOR CERVICAL FUSION/FORAMINOTOMY N/A 05/17/2017   Procedure: Cervical five  to Thorastic one  Posterior cervical fusion with lateral mass screws/revision of prior instrumentation;  Surgeon: Erline Levine, MD;  Location: [place];  Service: Neurosurgery;  Laterality: N/A;  C5 to T1 Posterior cervical fusion with lateral mass screws/revision of prior instrumentation  . TENDON REPAIR  JAN 2010  LEFT INDEX AND LONG FINGERS  . TUBAL LIGATION     Past Medical History:  Diagnosis Date  . Acute sinusitis, unspecified   . Anemia    "quite a few times"  . Anxiety state, unspecified   . Arachnoiditis BILATERAL LEGS   DUE TO MULTIPLE BACK SURG.'S  . Arthritis   . Asthma    last flare up was 03/2017 lasted over a month  . Blood transfusion   . Cancer ([place])    skin cancers (in scalp)  . Cardiomyopathy HX --06/2010   EF was 25% during acute illness (PHELONEPHRITIS) Repeat echo 12-06-10 60% showed normal EF.   Marland Kitchen Chronic back pain greater than 3 months duration    S/P BACK SURG'S  . CSF leak   . Diabetes mellitus without complication ([place])    dx 2013 type 2  . Dyslipidemia   . Dysphagia    some post op cerv fusion 2/14  . Dysrhythmia   . Essential hypertension, benign   . Family history of adverse reaction to anesthesia    mother gets n/v  . GERD (gastroesophageal reflux disease) AND HIATIAL HERNIA   CONTROLLED W/ NEXIUM  . Headache(784.0)   . Heart murmur    DENIES S & S   (ECHO JUN'12 W/ CHART)  . History of chronic bronchitis   . Hx of bladder infections   . Hypertension   . Murmur, heart   . Neuromuscular disorder (HCC)    numbness and tingling  . Osteoporosis   . Other malaise and fatigue   . PONV (postoperative nausea and vomiting)   .  Shortness of breath   . Spinal headache   . Spinal stenosis, cervical region   . Varicosities    venous  . Weakness of both legs DUE TO ARACHNOIDITIS   OCCASIONAL USES CANE   There were no vitals taken for this visit.  Opioid Risk Score:   Fall Risk Score:  `1  Depression screen PHQ 2/9  Depression screen [facility] 2/9 10/30/2018 08/14/2018 03/12/2018 10/25/2017 09/27/2017 07/02/2017 06/04/2017  Decreased Interest 0 0 0 0 0 1 1  Down, Depressed, Hopeless 0 - 0 0 0 1 1  PHQ - 2 Score 0 0 0 0 0 2 2  Altered sleeping - - - - 0 - -  Tired, decreased energy - - - - 0 - -  Change in appetite - - - - 0 - -  Feeling bad or failure about yourself  - - - - 0 - -  Trouble concentrating - - - - 0 - -  Moving slowly or fidgety/restless - - - - 0 - -  Suicidal thoughts - - - - 0 - -  PHQ-9 Score - - - - 0 - -  Some recent data might be hidden     Review of Systems  All other systems reviewed and are negative.      Objective:   Physical Exam Vitals signs and nursing note reviewed.  Constitutional:      Appearance: Normal appearance.  Neck:     Musculoskeletal: Normal range of motion and neck supple.     Comments: Cervical Paraspinal Tenderness: C-5-C-6 Cardiovascular:     Rate and Rhythm: Normal rate and regular rhythm.     Pulses: Normal pulses.     Heart sounds: Normal heart sounds.  Pulmonary:     Effort: Pulmonary effort is normal.     Breath sounds: Normal breath sounds.  Musculoskeletal:  Comments: Normal Muscle Bulk and Muscle Testing Reveals:  Upper Extremities: Full ROM and Muscle Strength 5/5 Bilateral AC Joint Tenderness Lumbar Hypersensitivity Lower Extremities: Full ROM and Muscle Strength 5/5 Arises from Table Slowly Narrow Based Gait   Skin:    General: Skin is warm and dry.  Neurological:     Mental Status: She is alert and oriented to person, place, and time.  Psychiatric:        Mood and Affect: Mood normal.        Behavior: Behavior normal.            Assessment & Plan:  1. On 05/17/2017 :C5C6C7 T1 Posterior Cervical Fusion with lateral mass fixation with AIRO Imaging, revision of prior instrumentation. By Dr. Vertell Limber.  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 Continuecurrent medication regimen withGabapentin800 mg Decreased: TID. and Decreased Oxycodone 10mg  one tablet every 6 hours as needed # 135 tablets and Oxymorphone HCL 40 mg every 12 hours #60. 12/25/2018 We will continue the opioid monitoring program, this consists of regular clinic visits, examinations, urine drug screen, pill counts as well as use of [place] Controlled Substance Reporting System. 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 12/25/2018 3. Anxiety/depression: Continue Valium, PCP Following. ContinueCymbalta. Continueto monitor.12/25/2018 4. Muscle Spasms: Continuecurrent medication regime withFlexeril. Continue to Monitor.12/25/2018 5. Cervicalgia/ Cervical Radiculitis: Continuecurrent medication regime withGabapentin: Dr. Vertell Limber Following: S/P C5C6C7 T1 Posterior Cervical Fusion with lateral mass fixation with AIRO Imaging, revision of prior instrumentation by Dr. Vertell Limber on 05/17/2017. 12/25/2018 7. Bilateral Thoracic Back Pain:No complaints today. Continue HEP as Tolerated and Continue current medication regimen. Continue to Monitor.12/25/2018 8. Left lower extremity DVT/ Phlebitis: S/P endovenous laser ablation of left great saphenous vein on 05/09/2018 by Dr. Scot Dock: Vascular Following  20  minutes of face to face patient care time was spent during this visit. All questions were encouraged and answered.  F/U in 1 month

## 2018-12-25 NOTE — Assessment & Plan Note (Signed)
Patient has a tele-visit today for hospital follow-up.  Patient states that she was admitted to [facility] on 12/06/2018 for respiratory failure.  We requested records from this visit.  Patient states that the ED doctor told her this was due to intentional overdose from narcotics.  Patient does see pain management and is currently on oxymorphone, oxycodone, gabapentin, Cymbalta, Valium.  She does have an appointment with pain management tomorrow and is hoping to be able to wean off of some of her medications.  Ready stopped Cymbalta and has decreased gabapentin to once daily.  Patient states that she has been doing well since hospital discharge.  She denies any significant shortness of breath or cough.   Note: requested records from ED visit. Will review and scan into chart.   Patient Instructions  Continue symbicort Please keep follow up appointment with pain management   Follow up: Follow up with Dr. Chase Caller at his first available

## 2018-12-28 LAB — TOXASSURE SELECT,+ANTIDEPR,UR

## 2019-01-02 ENCOUNTER — Telehealth: Payer: Self-pay | Admitting: *Deleted

## 2019-01-02 ENCOUNTER — Other Ambulatory Visit: Payer: Self-pay | Admitting: Physical Medicine & Rehabilitation

## 2019-01-02 DIAGNOSIS — F418 Other specified anxiety disorders: Secondary | ICD-10-CM

## 2019-01-02 NOTE — Telephone Encounter (Signed)
Urine drug screen for this encounter is consistent for prescribed medication. She has a prescription for diazepam.

## 2019-01-03 ENCOUNTER — Other Ambulatory Visit: Payer: Self-pay | Admitting: Internal Medicine

## 2019-01-03 DIAGNOSIS — Z1231 Encounter for screening mammogram for malignant neoplasm of breast: Secondary | ICD-10-CM

## 2019-01-09 DIAGNOSIS — J449 Chronic obstructive pulmonary disease, unspecified: Secondary | ICD-10-CM | POA: Diagnosis not present

## 2019-01-09 DIAGNOSIS — E1165 Type 2 diabetes mellitus with hyperglycemia: Secondary | ICD-10-CM | POA: Diagnosis not present

## 2019-01-09 DIAGNOSIS — I10 Essential (primary) hypertension: Secondary | ICD-10-CM | POA: Diagnosis not present

## 2019-01-10 NOTE — Telephone Encounter (Signed)
Message from patient

## 2019-01-24 ENCOUNTER — Other Ambulatory Visit: Payer: Self-pay

## 2019-01-24 ENCOUNTER — Encounter: Payer: Self-pay | Admitting: Registered Nurse

## 2019-01-24 ENCOUNTER — Encounter: Payer: Medicare Other | Attending: Physical Medicine & Rehabilitation | Admitting: Registered Nurse

## 2019-01-24 VITALS — BP 120/66 | HR 94 | Temp 97.8°F | Ht 65.0 in | Wt 184.2 lb

## 2019-01-24 DIAGNOSIS — Z79899 Other long term (current) drug therapy: Secondary | ICD-10-CM | POA: Diagnosis not present

## 2019-01-24 DIAGNOSIS — M5416 Radiculopathy, lumbar region: Secondary | ICD-10-CM | POA: Diagnosis not present

## 2019-01-24 DIAGNOSIS — M961 Postlaminectomy syndrome, not elsewhere classified: Secondary | ICD-10-CM | POA: Insufficient documentation

## 2019-01-24 DIAGNOSIS — G039 Meningitis, unspecified: Secondary | ICD-10-CM | POA: Insufficient documentation

## 2019-01-24 DIAGNOSIS — G894 Chronic pain syndrome: Secondary | ICD-10-CM

## 2019-01-24 DIAGNOSIS — F418 Other specified anxiety disorders: Secondary | ICD-10-CM

## 2019-01-24 DIAGNOSIS — Z79891 Long term (current) use of opiate analgesic: Secondary | ICD-10-CM

## 2019-01-24 DIAGNOSIS — M5441 Lumbago with sciatica, right side: Secondary | ICD-10-CM | POA: Diagnosis not present

## 2019-01-24 DIAGNOSIS — Z5181 Encounter for therapeutic drug level monitoring: Secondary | ICD-10-CM | POA: Insufficient documentation

## 2019-01-24 DIAGNOSIS — Z981 Arthrodesis status: Secondary | ICD-10-CM

## 2019-01-24 DIAGNOSIS — M5412 Radiculopathy, cervical region: Secondary | ICD-10-CM | POA: Diagnosis not present

## 2019-01-24 DIAGNOSIS — F5101 Primary insomnia: Secondary | ICD-10-CM

## 2019-01-24 DIAGNOSIS — M62838 Other muscle spasm: Secondary | ICD-10-CM

## 2019-01-24 DIAGNOSIS — M542 Cervicalgia: Secondary | ICD-10-CM

## 2019-01-24 MED ORDER — OXYMORPHONE HCL ER 40 MG PO T12A: 40.0000 mg | EXTENDED_RELEASE_TABLET | Freq: Two times a day (BID) | ORAL | 0 refills | Status: DC

## 2019-01-24 MED ORDER — NORTRIPTYLINE HCL 10 MG PO CAPS: 10.0000 mg | ORAL_CAPSULE | Freq: Every day | ORAL | 2 refills | Status: DC

## 2019-01-24 MED ORDER — OXYCODONE HCL 10 MG PO TABS: 10.0000 mg | ORAL_TABLET | Freq: Four times a day (QID) | ORAL | 0 refills | Status: DC | PRN

## 2019-01-24 NOTE — Progress Notes (Signed)
Subjective:    Patient ID: [name] [name], female    DOB: Aug 11, 1950, 68 y.o.   MRN: [id number]  HPI: [name] [name] is a 68 y.o. female who returns for follow up appointment for chronic pain and medication refill. She states her pain is located in her neck radiating into her bilateral shoulders, mid- lower back pain radiating into her her bilateral lower extremities. Also reports increase intensity of mid- back pack since her fall a few months ago, she is following up with neurosurgery. She rates her pain 8. Her current exercise regime is walking.  [name] [name] states she has slowly weaned off Cymbalta and she has slowly weaned down her Oxycodone to 4 times a day as need. The goal is to decrease to 3 times a day as needed.   [name] [name] Morphine equivalent is 315.00 MME. She is also prescribed Diazepam  by Dr. Sherrie Sport. We have discussed the black box warning of using opioids and benzodiazepines. I highlighted the dangers of using these drugs together and discussed the adverse events including respiratory suppression, overdose, cognitive impairment and importance of compliance with current regimen. We will continue to monitor and adjust as indicated.   Last UDS was Performed on 12/25/2018, it was consistent.     Pain Inventory Average Pain 7 Pain Right Now 8 My pain is sharp, burning, stabbing, tingling and aching  In the last 24 hours, has pain interfered with the following? General activity 7 Relation with others 7 Enjoyment of life 7 What TIME of day is your pain at its worst? all Sleep (in general) Poor  Pain is worse with: walking, bending, standing and some activites Pain improves with: heat/ice and medication Relief from Meds: 7  Mobility Do you have any goals in this area?  no  Function Do you have any goals in this area?  no  Neuro/Psych No problems in this area  Prior Studies Any changes since last visit?  no  Physicians involved in your care Primary care Dr.  Sherrie Sport Dr. Roselie Skinner   Family History  Problem Relation Age of Onset   Hypertension Mother    Heart disease Mother    Diabetes Mother    Cancer Father    Diabetes Brother    Social History   Socioeconomic History   Marital status: Divorced    Spouse name: Not on file   Number of children: Not on file   Years of education: Not on file   Highest education level: Not on file  Occupational History   Occupation: disable    Employer: DISABLED  Social Designer, fashion/clothing strain: Not on file   Food insecurity    Worry: Not on file    Inability: Not on file   Transportation needs    Medical: Not on file    Non-medical: Not on file  Tobacco Use   Smoking status: Never Smoker   Smokeless tobacco: Never Used  Substance and Sexual Activity   Alcohol use: No   Drug use: No   Sexual activity: Not on file    Comment: second hand smoke  Lifestyle   Physical activity    Days per week: Not on file    Minutes per session: Not on file   Stress: Not on file  Relationships   Social connections    Talks on phone: Not on file    Gets together: Not on file    Attends religious service: Not on file    Active  member of club or organization: Not on file    Attends meetings of clubs or organizations: Not on file    Relationship status: Not on file  Other Topics Concern   Not on file  Social History Narrative   Has one child   Disabled   Past Surgical History:  Procedure Laterality Date   ABDOMINAL HYSTERECTOMY  1987   W/ [place] DECOMP/DISCECTOMY FUSION N/A 08/09/2012   Procedure: ANTERIOR CERVICAL DECOMPRESSION/DISCECTOMY FUSION 1 LEVEL;  Surgeon: Floyce Stakes, MD;  Location: [facility];  Service: Neurosurgery;  Laterality: N/A;  Cervical four-five  Anterior cervical decompression/diskectomy/fusion   ANTERIOR FUSION CERVICAL SPINE  2007   C5 -6   APPENDECTOMY     APPLICATION OF INTRAOPERATIVE CT SCAN N/A 05/17/2017    Procedure: APPLICATION OF INTRAOPERATIVE CT SCAN;  Surgeon: Erline Levine, MD;  Location: [facility];  Service: Neurosurgery;  Laterality: N/A;   BACK SURGERY     x 5   BREAST EXCISIONAL BIOPSY Bilateral    No scar seen    BREAST SURGERY     x 2 biopsies   CARPAL TUNNEL RELEASE  RIGHT - 2001  & DEC 2011 W/ BACK SURG.   CERVICAL DISC SURGERY  2005   C5 - 6   CHOLECYSTECTOMY  1994   DORSAL COMPARTMENT RELEASE Right 04/07/2013   Procedure: RIGHT WRIST STS RELEASE;  Surgeon: Schuyler Amor, MD;  Location: [facility];  Service: Orthopedics;  Laterality: Right;   EXPLORATION OF INCISION FOR CSF LEAK  DEC 2011  X2   POST LAMINECOTMY   FINGER ARTHRODESIS Right 04/07/2013   Procedure: RIGHT INDEX AND RIGHT LONG DISTAL INTERPHALANGEAL JOINT FUSIONS;  Surgeon: Schuyler Amor, MD;  Location: [facility];  Service: Orthopedics;  Laterality: Right;   FINGER ARTHROPLASTY Right 04/07/2013   Procedure: RIGHT THUMB [place] ARTHROPLASTY;  Surgeon: Schuyler Amor, MD;  Location: [facility];  Service: Orthopedics;  Laterality: Right;   GANGLION CYST EXCISION Right 04/07/2013   Procedure: RIGHT WRIST MASS EXCISION;  Surgeon: Schuyler Amor, MD;  Location: [facility];  Service: Orthopedics;  Laterality: Right;   KNEE ARTHROSCOPY  LEFT X3 (LAST ONE 2005)   KNEE ARTHROSCOPY  05/17/2011   Procedure: ARTHROSCOPY KNEE;  Surgeon: Bradley Ferris III;  Location: [facility];  Service: Orthopedics;  Laterality: Right;  WITH MEDIAL MENISECTOMY AND removal of suprapatella fat lump   LEFT WRIST TENOSYNECTOMY W/ LEFT THUMB JOINT REPAIR  02-24-10   LUMBAR FUSION  11-30-10   L4 - 5   LUMBAR FUSION  2000   L2 - 4   LUMBAR LAMINECTOMY  DEC 2011   L4 - 5   POSTERIOR CERVICAL FUSION/FORAMINOTOMY N/A 05/17/2017   Procedure: Cervical five  to Thorastic one  Posterior cervical fusion with lateral mass screws/revision of prior  instrumentation;  Surgeon: Erline Levine, MD;  Location: [facility];  Service: Neurosurgery;  Laterality: N/A;  C5 to T1 Posterior cervical fusion with lateral mass screws/revision of prior instrumentation   TENDON REPAIR  JAN 2010   LEFT INDEX AND LONG FINGERS   TUBAL LIGATION     Past Medical History:  Diagnosis Date   Acute sinusitis, unspecified    Anemia    "quite a few times"   Anxiety state, unspecified    Arachnoiditis BILATERAL LEGS   DUE TO MULTIPLE BACK SURG.'S   Arthritis    Asthma    last flare  up was 03/2017 lasted over a month   Blood transfusion    Cancer ([place])    skin cancers (in scalp)   Cardiomyopathy HX --06/2010   EF was 25% during acute illness (PHELONEPHRITIS) Repeat echo 12-06-10 60% showed normal EF.    Chronic back pain greater than 3 months duration    S/P BACK SURG'S   CSF leak    Diabetes mellitus without complication ([place])    dx 2013 type 2   Dyslipidemia    Dysphagia    some post op cerv fusion 2/14   Dysrhythmia    Essential hypertension, benign    Family history of adverse reaction to anesthesia    mother gets n/v   GERD (gastroesophageal reflux disease) AND HIATIAL HERNIA   CONTROLLED W/ NEXIUM   Headache(784.0)    Heart murmur    DENIES S & S   (ECHO JUN'12 W/ CHART)   History of chronic bronchitis    Hx of bladder infections    Hypertension    Murmur, heart    Neuromuscular disorder (HCC)    numbness and tingling   Osteoporosis    Other malaise and fatigue    PONV (postoperative nausea and vomiting)    Shortness of breath    Spinal headache    Spinal stenosis, cervical region    Varicosities    venous   Weakness of both legs DUE TO ARACHNOIDITIS   OCCASIONAL USES CANE   BP 120/66    Pulse 94    Temp 97.8 F (36.6 C)    Ht 5\' 5"  (1.651 m)    Wt 184 lb 3.2 oz (83.6 kg)    SpO2 97%    BMI 30.65 kg/m   Opioid Risk Score:   Fall Risk Score:  `1  Depression screen PHQ 2/9  Depression screen [facility]  2/9 10/30/2018 08/14/2018 03/12/2018 10/25/2017 09/27/2017 07/02/2017 06/04/2017  Decreased Interest 0 0 0 0 0 1 1  Down, Depressed, Hopeless 0 - 0 0 0 1 1  PHQ - 2 Score 0 0 0 0 0 2 2  Altered sleeping - - - - 0 - -  Tired, decreased energy - - - - 0 - -  Change in appetite - - - - 0 - -  Feeling bad or failure about yourself  - - - - 0 - -  Trouble concentrating - - - - 0 - -  Moving slowly or fidgety/restless - - - - 0 - -  Suicidal thoughts - - - - 0 - -  PHQ-9 Score - - - - 0 - -  Some recent data might be hidden     Review of Systems  Constitutional: Negative.   HENT: Negative.   Eyes: Negative.   Respiratory: Negative.   Cardiovascular: Negative.   Gastrointestinal: Negative.   Endocrine: Negative.   Genitourinary: Negative.   Musculoskeletal: Negative.   Skin: Negative.   Allergic/Immunologic: Negative.   Neurological: Negative.   Hematological: Negative.   Psychiatric/Behavioral: Negative.   All other systems reviewed and are negative.      Objective:   Physical Exam Vitals signs and nursing note reviewed.  Constitutional:      Appearance: Normal appearance.  Neck:     Musculoskeletal: Normal range of motion and neck supple.  Cardiovascular:     Rate and Rhythm: Normal rate and regular rhythm.     Pulses: Normal pulses.     Heart sounds: Normal heart sounds.  Pulmonary:  Effort: Pulmonary effort is normal.     Breath sounds: Normal breath sounds.  Musculoskeletal:     Comments: Normal Muscle Bulk and Muscle Testing Reveals:  Upper Extremities: Full ROM and Muscle Strength 5/5 Bilateral AC Joint Tenderness Thoracic Hypersensitivity: T7-T-12 Lumbar Paraspinal Tenderness: L-3-L-5 Lower Extremities: Full ROM and Muscle Strength 5/5 Arises from Table Slowly using cane for support Antalgic Gait   Skin:    General: Skin is warm and dry.  Neurological:     Mental Status: She is alert and oriented to person, place, and time.  Psychiatric:        Mood and  Affect: Mood normal.        Behavior: Behavior normal.           Assessment & Plan:  1. On 05/17/2017 :C5C6C7 T1 Posterior Cervical Fusion with lateral mass fixation with AIRO Imaging, revision of prior instrumentation. By Dr. Vertell Limber.  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 Continuecurrent medication regimen withGabapentin800 mg Decreased: BID-TID. and Decreased Oxycodone 10mg  one tablet every 6 hours as needed # 120 tablets and Oxymorphone HCL 40 mg every 12 hours #60. 01/24/2019 We will continue the opioid monitoring program, this consists of regular clinic visits, examinations, urine drug screen, pill counts as well as use of [place] Controlled Substance Reporting System. 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 Continue to Monitor. 01/24/2019 3. Anxiety/depression: Continue Valium, PCP Following. Continueto monitor.01/24/2019 4. Muscle Spasms: Continuecurrent medication regime withFlexeril. Continue to Monitor.12/25/2018 5. Cervicalgia/ Cervical Radiculitis: Continuecurrent medication regime withGabapentin: Dr. Vertell Limber Following: S/P C5C6C7 T1 Posterior Cervical Fusion with lateral mass fixation with AIRO Imaging, revision of prior instrumentation by Dr. Vertell Limber on 05/17/2017. 01/24/2019 7. Bilateral Thoracic Back Pain:Continue HEP as Tolerated and Continue current medication regimen. Continue to Monitor.01/24/2019 8. Left lower extremity DVT/ Phlebitis: S/P endovenous laser ablation of left great saphenous vein on 05/09/2018 by Dr. Scot Dock: Vascular Following 9. Insomnia: RX: Pamelor. Instructed to send My-chart message in a week for medication evaluation, she verbalizes understanding.   15 minutes of face to face patient care time was spent during this visit. All questions were encouraged and answered.  F/U in 1 month

## 2019-01-30 ENCOUNTER — Ambulatory Visit: Payer: Medicare Other | Admitting: Cardiology

## 2019-02-03 ENCOUNTER — Telehealth: Payer: Self-pay | Admitting: Nurse Practitioner

## 2019-02-03 DIAGNOSIS — G039 Meningitis, unspecified: Secondary | ICD-10-CM

## 2019-02-03 DIAGNOSIS — G992 Myelopathy in diseases classified elsewhere: Secondary | ICD-10-CM

## 2019-02-03 DIAGNOSIS — M48061 Spinal stenosis, lumbar region without neurogenic claudication: Secondary | ICD-10-CM

## 2019-02-03 NOTE — Telephone Encounter (Signed)
Please let pt know i've ordered a ct myelogram for her

## 2019-02-03 NOTE — Telephone Encounter (Signed)
Message from patient

## 2019-02-03 NOTE — Telephone Encounter (Signed)
Phone call to patient to verify medication list and allergies for myelogram procedure. Pt instructed to hold Nortriptyline for 48hrs prior to myelogram appointment time. Pt verbalized understanding. Pre and post procedure instructions reviewed with pt.

## 2019-02-07 ENCOUNTER — Other Ambulatory Visit: Payer: Self-pay | Admitting: Physical Medicine & Rehabilitation

## 2019-02-07 DIAGNOSIS — G992 Myelopathy in diseases classified elsewhere: Secondary | ICD-10-CM

## 2019-02-07 DIAGNOSIS — G039 Meningitis, unspecified: Secondary | ICD-10-CM

## 2019-02-07 DIAGNOSIS — M48061 Spinal stenosis, lumbar region without neurogenic claudication: Secondary | ICD-10-CM

## 2019-02-10 ENCOUNTER — Ambulatory Visit
Admission: RE | Admit: 2019-02-10 | Discharge: 2019-02-10 | Disposition: A | Discharge status: Home / Self Care | Payer: Medicare Other | Source: Ambulatory Visit | Attending: Physical Medicine & Rehabilitation | Admitting: Physical Medicine & Rehabilitation

## 2019-02-10 ENCOUNTER — Other Ambulatory Visit: Payer: Self-pay

## 2019-02-10 DIAGNOSIS — M545 Low back pain: Secondary | ICD-10-CM | POA: Diagnosis not present

## 2019-02-10 DIAGNOSIS — G992 Myelopathy in diseases classified elsewhere: Secondary | ICD-10-CM

## 2019-02-10 DIAGNOSIS — M79606 Pain in leg, unspecified: Secondary | ICD-10-CM | POA: Diagnosis not present

## 2019-02-10 DIAGNOSIS — G039 Meningitis, unspecified: Secondary | ICD-10-CM

## 2019-02-10 DIAGNOSIS — M25551 Pain in right hip: Secondary | ICD-10-CM | POA: Diagnosis not present

## 2019-02-10 DIAGNOSIS — M5126 Other intervertebral disc displacement, lumbar region: Secondary | ICD-10-CM | POA: Diagnosis not present

## 2019-02-10 DIAGNOSIS — M5124 Other intervertebral disc displacement, thoracic region: Secondary | ICD-10-CM | POA: Diagnosis not present

## 2019-02-10 DIAGNOSIS — M25552 Pain in left hip: Secondary | ICD-10-CM | POA: Diagnosis not present

## 2019-02-10 DIAGNOSIS — M48061 Spinal stenosis, lumbar region without neurogenic claudication: Secondary | ICD-10-CM

## 2019-02-10 MED ORDER — IOPAMIDOL (ISOVUE-M 300) INJECTION 61%
10.0000 mL | Freq: Once | INTRAMUSCULAR | Status: AC | PRN
  Administered 2019-02-10: 10 mL via INTRATHECAL

## 2019-02-10 NOTE — Discharge Instructions (Signed)
Myelogram Discharge Instructions  1. Go home and rest quietly for the next 24 hours.  It is important to lie flat for the next 24 hours.  Get up only to go to the restroom.  You may lie in the bed or on a couch on your back, your stomach, your left side or your right side.  You may have one pillow under your head.  You may have pillows between your knees while you are on your side or under your knees while you are on your back.  2. DO NOT drive today.  Recline the seat as far back as it will go, while still wearing your seat belt, on the way home.  3. You may get up to go to the bathroom as needed.  You may sit up for 10 minutes to eat.  You may resume your normal diet and medications unless otherwise indicated.  Drink plenty of extra fluids today and tomorrow.  4. The incidence of a spinal headache with nausea and/or vomiting is about 5% (one in 20 patients).  If you develop a headache, lie flat and drink plenty of fluids until the headache goes away.  Caffeinated beverages may be helpful.  If you develop severe nausea and vomiting or a headache that does not go away with flat bed rest, call [phone].  5. You may resume normal activities after your 24 hours of bed rest is over; however, do not exert yourself strongly or do any heavy lifting tomorrow.  6. Call your physician for a follow-up appointment.    You may resume Nortriptyline on Tuesday, February 11, 2019 after 1030a.m.

## 2019-02-10 NOTE — Progress Notes (Addendum)
Patient states she has been off Nortriptyline for at least the past 48 hours.  She also informed us she took her own Valium 5mg  PO at 0900 this morning, so we will not be giving her any prior to her procedure today.

## 2019-02-17 ENCOUNTER — Telehealth (HOSPITAL_COMMUNITY): Payer: Self-pay | Admitting: Rehabilitation

## 2019-02-17 NOTE — Telephone Encounter (Signed)

## 2019-02-18 ENCOUNTER — Other Ambulatory Visit: Payer: Self-pay

## 2019-02-18 ENCOUNTER — Ambulatory Visit
Admission: RE | Admit: 2019-02-18 | Discharge: 2019-02-18 | Disposition: A | Discharge status: Home / Self Care | Payer: Medicare Other | Source: Ambulatory Visit | Attending: Internal Medicine | Admitting: Internal Medicine

## 2019-02-18 ENCOUNTER — Ambulatory Visit: Payer: Medicare Other

## 2019-02-18 DIAGNOSIS — Z1231 Encounter for screening mammogram for malignant neoplasm of breast: Secondary | ICD-10-CM

## 2019-02-18 DIAGNOSIS — I8392 Asymptomatic varicose veins of left lower extremity: Secondary | ICD-10-CM

## 2019-02-18 DIAGNOSIS — I8393 Asymptomatic varicose veins of bilateral lower extremities: Secondary | ICD-10-CM | POA: Diagnosis not present

## 2019-02-18 NOTE — Progress Notes (Signed)
       Pt left lower leg spider veins treated below calf, around lower shin, foot and ankle with 68mL of 1% Asclera.  Pt had a lot of small spider veins on lower legs and I treated as many as possible with 2 units (85mL) as approved by her insurance. Dr. Donnetta Hutching came in to assess pt prior to treating her today, as she had a recent onset ('2-3 weeks") of swelling in left leg. Dr. Donnetta Hutching felt we could proceed with sclerotherapy of lower leg.Pt tolerated well. Pt provided with post procedure instructions both verbally and on handout. Compression stockings applied. Pictures taken. Will follow PRN.

## 2019-02-24 ENCOUNTER — Other Ambulatory Visit: Payer: Self-pay | Admitting: Registered Nurse

## 2019-02-24 DIAGNOSIS — M961 Postlaminectomy syndrome, not elsewhere classified: Secondary | ICD-10-CM

## 2019-02-25 ENCOUNTER — Other Ambulatory Visit: Payer: Self-pay

## 2019-02-25 ENCOUNTER — Encounter: Payer: Medicare Other | Attending: Physical Medicine & Rehabilitation | Admitting: Registered Nurse

## 2019-02-25 ENCOUNTER — Encounter: Payer: Self-pay | Admitting: Registered Nurse

## 2019-02-25 VITALS — BP 151/83 | HR 83 | Temp 98.7°F | Ht 65.0 in

## 2019-02-25 DIAGNOSIS — M5441 Lumbago with sciatica, right side: Secondary | ICD-10-CM | POA: Insufficient documentation

## 2019-02-25 DIAGNOSIS — M19041 Primary osteoarthritis, right hand: Secondary | ICD-10-CM | POA: Diagnosis not present

## 2019-02-25 DIAGNOSIS — M25741 Osteophyte, right hand: Secondary | ICD-10-CM | POA: Diagnosis not present

## 2019-02-25 DIAGNOSIS — Z5181 Encounter for therapeutic drug level monitoring: Secondary | ICD-10-CM | POA: Insufficient documentation

## 2019-02-25 DIAGNOSIS — M961 Postlaminectomy syndrome, not elsewhere classified: Secondary | ICD-10-CM | POA: Insufficient documentation

## 2019-02-25 DIAGNOSIS — G894 Chronic pain syndrome: Secondary | ICD-10-CM | POA: Diagnosis not present

## 2019-02-25 DIAGNOSIS — M62838 Other muscle spasm: Secondary | ICD-10-CM

## 2019-02-25 DIAGNOSIS — M542 Cervicalgia: Secondary | ICD-10-CM

## 2019-02-25 DIAGNOSIS — M7989 Other specified soft tissue disorders: Secondary | ICD-10-CM | POA: Diagnosis not present

## 2019-02-25 DIAGNOSIS — M5416 Radiculopathy, lumbar region: Secondary | ICD-10-CM | POA: Diagnosis not present

## 2019-02-25 DIAGNOSIS — Z79891 Long term (current) use of opiate analgesic: Secondary | ICD-10-CM | POA: Diagnosis not present

## 2019-02-25 DIAGNOSIS — Z79899 Other long term (current) drug therapy: Secondary | ICD-10-CM | POA: Insufficient documentation

## 2019-02-25 DIAGNOSIS — M19042 Primary osteoarthritis, left hand: Secondary | ICD-10-CM | POA: Diagnosis not present

## 2019-02-25 DIAGNOSIS — G039 Meningitis, unspecified: Secondary | ICD-10-CM | POA: Diagnosis not present

## 2019-02-25 DIAGNOSIS — M24041 Loose body in right finger joint(s): Secondary | ICD-10-CM | POA: Diagnosis not present

## 2019-02-25 MED ORDER — NORTRIPTYLINE HCL 25 MG PO CAPS: 25.0000 mg | ORAL_CAPSULE | Freq: Every day | ORAL | 2 refills | Status: DC

## 2019-02-25 MED ORDER — OXYMORPHONE HCL ER 40 MG PO T12A: 40.0000 mg | EXTENDED_RELEASE_TABLET | Freq: Two times a day (BID) | ORAL | 0 refills | Status: DC

## 2019-02-25 MED ORDER — OXYCODONE HCL 10 MG PO TABS: 10.0000 mg | ORAL_TABLET | Freq: Four times a day (QID) | ORAL | 0 refills | Status: DC | PRN

## 2019-02-25 NOTE — Telephone Encounter (Signed)
Patient is being seen today

## 2019-02-25 NOTE — Progress Notes (Signed)
Subjective:    Patient ID: [name] [name], female    DOB: 03-03-1951, 68 y.o.   MRN: [id number]  HPI: [name] [name] is a 67 y.o. female who returns for follow up appointment for chronic pain and medication refill. She states her pain is located in her neck and upper - lower back radiating into her her bilateral lower extremities. She rates her pain 8. Her current exercise regime is walking.   [name] [name] Morphine equivalent is 300.00 MME.  Last UDS was Performed on 12/25/2018, it was consistent.   Pain Inventory Average Pain 8 Pain Right Now 8 My pain is sharp, burning, stabbing and tingling  In the last 24 hours, has pain interfered with the following? General activity 7 Relation with others 7 Enjoyment of life 7 What TIME of day is your pain at its worst? all day Sleep (in general) Fair  Pain is worse with: walking, inactivity, standing and some activites Pain improves with: rest, heat/ice and medication Relief from Meds: 7  Mobility Do you have any goals in this area?  no  Function Do you have any goals in this area?  no  Neuro/Psych No problems in this area  Prior Studies Any changes since last visit?  yes  Physicians involved in your care Any changes since last visit?  yes   Family History  Problem Relation Age of Onset  . Hypertension Mother   . Heart disease Mother   . Diabetes Mother   . Cancer Father   . Diabetes Brother    Social History   Socioeconomic History  . Marital status: Divorced    Spouse name: Not on file  . Number of children: Not on file  . Years of education: Not on file  . Highest education level: Not on file  Occupational History  . Occupation: disable    Employer: DISABLED  Social Needs  . Financial resource strain: Not on file  . Food insecurity    Worry: Not on file    Inability: Not on file  . Transportation needs    Medical: Not on file    Non-medical: Not on file  Tobacco Use  . Smoking status: Never Smoker  .  Smokeless tobacco: Never Used  Substance and Sexual Activity  . Alcohol use: No  . Drug use: No  . Sexual activity: Not on file    Comment: second hand smoke  Lifestyle  . Physical activity    Days per week: Not on file    Minutes per session: Not on file  . Stress: Not on file  Relationships  . Social Herbalist on phone: Not on file    Gets together: Not on file    Attends religious service: Not on file    Active member of club or organization: Not on file    Attends meetings of clubs or organizations: Not on file    Relationship status: Not on file  Other Topics Concern  . Not on file  Social History Narrative   Has one child   Disabled   Past Surgical History:  Procedure Laterality Date  . ABDOMINAL HYSTERECTOMY  1987   W/ BSO  . ANTERIOR CERVICAL DECOMP/DISCECTOMY FUSION N/A 08/09/2012   Procedure: ANTERIOR CERVICAL DECOMPRESSION/DISCECTOMY FUSION 1 LEVEL;  Surgeon: Floyce Stakes, MD;  Location: [facility];  Service: Neurosurgery;  Laterality: N/A;  Cervical four-five  Anterior cervical decompression/diskectomy/fusion  . ANTERIOR FUSION CERVICAL SPINE  2007   C5 -  6  . APPENDECTOMY    . APPLICATION OF INTRAOPERATIVE CT SCAN N/A 05/17/2017   Procedure: APPLICATION OF INTRAOPERATIVE CT SCAN;  Surgeon: Erline Levine, MD;  Location: [place];  Service: Neurosurgery;  Laterality: N/A;  . BACK SURGERY     x 5  . BREAST EXCISIONAL BIOPSY Bilateral    No scar seen   . BREAST SURGERY     x 2 biopsies  . CARPAL TUNNEL RELEASE  RIGHT - 2001  & DEC 2011 W/ BACK SURG.  . CERVICAL DISC SURGERY  2005   C5 - 6  . CHOLECYSTECTOMY  1994  . DORSAL COMPARTMENT RELEASE Right 04/07/2013   Procedure: RIGHT WRIST STS RELEASE;  Surgeon: Schuyler Amor, MD;  Location: [place];  Service: Orthopedics;  Laterality: Right;  . EXPLORATION OF INCISION FOR CSF LEAK  DEC 2011  X2   POST LAMINECOTMY  . FINGER ARTHRODESIS Right 04/07/2013   Procedure: RIGHT INDEX  AND RIGHT LONG DISTAL INTERPHALANGEAL JOINT FUSIONS;  Surgeon: Schuyler Amor, MD;  Location: [place];  Service: Orthopedics;  Laterality: Right;  . FINGER ARTHROPLASTY Right 04/07/2013   Procedure: RIGHT THUMB [place] ARTHROPLASTY;  Surgeon: Schuyler Amor, MD;  Location: [place];  Service: Orthopedics;  Laterality: Right;  . GANGLION CYST EXCISION Right 04/07/2013   Procedure: RIGHT WRIST MASS EXCISION;  Surgeon: Schuyler Amor, MD;  Location: [place];  Service: Orthopedics;  Laterality: Right;  . KNEE ARTHROSCOPY  LEFT X3 (LAST ONE 2005)  . KNEE ARTHROSCOPY  05/17/2011   Procedure: ARTHROSCOPY KNEE;  Surgeon: Bradley Ferris III;  Location: [place];  Service: Orthopedics;  Laterality: Right;  WITH MEDIAL MENISECTOMY AND removal of suprapatella fat lump  . LEFT WRIST TENOSYNECTOMY W/ LEFT THUMB JOINT REPAIR  02-24-10  . LUMBAR FUSION  11-30-10   L4 - 5  . LUMBAR FUSION  2000   L2 - 4  . LUMBAR LAMINECTOMY  DEC 2011   L4 - 5  . POSTERIOR CERVICAL FUSION/FORAMINOTOMY N/A 05/17/2017   Procedure: Cervical five  to Thorastic one  Posterior cervical fusion with lateral mass screws/revision of prior instrumentation;  Surgeon: Erline Levine, MD;  Location: [place];  Service: Neurosurgery;  Laterality: N/A;  C5 to T1 Posterior cervical fusion with lateral mass screws/revision of prior instrumentation  . TENDON REPAIR  JAN 2010   LEFT INDEX AND LONG FINGERS  . TUBAL LIGATION     Past Medical History:  Diagnosis Date  . Acute sinusitis, unspecified   . Anemia    "quite a few times"  . Anxiety state, unspecified   . Arachnoiditis BILATERAL LEGS   DUE TO MULTIPLE BACK SURG.'S  . Arthritis   . Asthma    last flare up was 03/2017 lasted over a month  . Blood transfusion   . Cancer ([place])    skin cancers (in scalp)  . Cardiomyopathy HX --06/2010   EF was 25% during acute illness (PHELONEPHRITIS) Repeat echo 12-06-10  60% showed normal EF.   Marland Kitchen Chronic back pain greater than 3 months duration    S/P BACK SURG'S  . CSF leak   . Diabetes mellitus without complication ([place])    dx 2013 type 2  . Dyslipidemia   . Dysphagia    some post op cerv fusion 2/14  . Dysrhythmia   . Essential hypertension, benign   . Family history of adverse reaction to anesthesia    mother gets  n/v  . GERD (gastroesophageal reflux disease) AND HIATIAL HERNIA   CONTROLLED W/ NEXIUM  . Headache(784.0)   . Heart murmur    DENIES S & S   (ECHO JUN'12 W/ CHART)  . History of chronic bronchitis   . Hx of bladder infections   . Hypertension   . Murmur, heart   . Neuromuscular disorder (HCC)    numbness and tingling  . Osteoporosis   . Other malaise and fatigue   . PONV (postoperative nausea and vomiting)   . Shortness of breath   . Spinal headache   . Spinal stenosis, cervical region   . Varicosities    venous  . Weakness of both legs DUE TO ARACHNOIDITIS   OCCASIONAL USES CANE   There were no vitals taken for this visit.  Opioid Risk Score:   Fall Risk Score:  `1  Depression screen PHQ 2/9  Depression screen [facility] 2/9 10/30/2018 08/14/2018 03/12/2018 10/25/2017 09/27/2017 07/02/2017 06/04/2017  Decreased Interest 0 0 0 0 0 1 1  Down, Depressed, Hopeless 0 - 0 0 0 1 1  PHQ - 2 Score 0 0 0 0 0 2 2  Altered sleeping - - - - 0 - -  Tired, decreased energy - - - - 0 - -  Change in appetite - - - - 0 - -  Feeling bad or failure about yourself  - - - - 0 - -  Trouble concentrating - - - - 0 - -  Moving slowly or fidgety/restless - - - - 0 - -  Suicidal thoughts - - - - 0 - -  PHQ-9 Score - - - - 0 - -  Some recent data might be hidden    Review of Systems  Constitutional: Negative.   HENT: Negative.   Eyes: Negative.   Respiratory: Negative.   Cardiovascular: Negative.   Gastrointestinal: Negative.   Endocrine: Negative.   Genitourinary: Negative.   Musculoskeletal: Positive for back pain.  Skin: Negative.    Allergic/Immunologic: Negative.   Neurological: Negative.   Hematological: Negative.   Psychiatric/Behavioral: Negative.   All other systems reviewed and are negative.      Objective:   Physical Exam Vitals signs and nursing note reviewed.  Constitutional:      Appearance: Normal appearance.  Neck:     Musculoskeletal: Normal range of motion and neck supple.  Cardiovascular:     Rate and Rhythm: Normal rate and regular rhythm.     Pulses: Normal pulses.     Heart sounds: Normal heart sounds.  Pulmonary:     Effort: Pulmonary effort is normal.     Breath sounds: Normal breath sounds.  Musculoskeletal:     Comments: Normal Muscle Bulk and Muscle Testing Reveals:  Upper Extremities: Full ROM and Muscle Strength 5/5 Bilateral AC Joint Tenderness  Thoracic and Lumbar Hypersensitivity Lower Extremities: Full ROM and Muscle Strength 5/5 Arises from Table with ease Narrow Based  Gait   Skin:    General: Skin is warm and dry.  Neurological:     Mental Status: She is alert and oriented to person, place, and time.  Psychiatric:        Mood and Affect: Mood normal.        Behavior: Behavior normal.           Assessment & Plan:  1. On 05/17/2017 :C5C6C7 T1 Posterior Cervical Fusion with lateral mass fixation with AIRO Imaging, revision of prior instrumentation. By Dr. Vertell Limber.  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 Continuecurrent medication regimen withGabapentin800 mgDecreased: BID-TID. and Refilled: Oxycodone 10mg  one tablet every 6 hours as needed #120 tabletsand Oxymorphone HCL 40 mg every 12 hours #60. 02/25/2019 We will continue the opioid monitoring program, this consists of regular clinic visits, examinations, urine drug screen, pill counts as well as use of [place] Controlled Substance Reporting System. 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 Continue to Monitor. 02/25/2019 3. Anxiety/depression: Continue Valium, PCP Following. Continueto monitor.02/25/2019 4. Muscle Spasms: Continuecurrent medication regime withFlexeril. Continue to Monitor.02/25/2019 5. Cervicalgia/ Cervical Radiculitis: Continuecurrent medication regime withGabapentin: Dr. Vertell Limber Following: S/P C5C6C7 T1 Posterior Cervical Fusion with lateral mass fixation with AIRO Imaging, revision of prior instrumentation by Dr. Vertell Limber on 05/17/2017. 02/25/2019 7. Bilateral Thoracic Back Pain:Continue HEP as Tolerated and Continue current medication regimen. Continue to Monitor.02/25/2019 8. Left lower extremity DVT/ Phlebitis: S/P endovenous laser ablation of left great saphenous vein on 05/09/2018 by Dr. Scot Dock: Vascular Following. 02/25/2019. 9. Insomnia: Continue Pamelor. Continue to Monitor.   41minutes of face to face patient care time was spent during this visit. All questions were encouraged and answered.  F/U in 1 month

## 2019-03-17 ENCOUNTER — Ambulatory Visit: Payer: Medicare Other | Admitting: Internal Medicine

## 2019-03-19 DIAGNOSIS — E782 Mixed hyperlipidemia: Secondary | ICD-10-CM | POA: Diagnosis not present

## 2019-03-19 DIAGNOSIS — I10 Essential (primary) hypertension: Secondary | ICD-10-CM | POA: Diagnosis not present

## 2019-03-19 DIAGNOSIS — E119 Type 2 diabetes mellitus without complications: Secondary | ICD-10-CM | POA: Diagnosis not present

## 2019-03-24 ENCOUNTER — Encounter: Payer: Medicare Other | Attending: Physical Medicine & Rehabilitation | Admitting: Registered Nurse

## 2019-03-24 ENCOUNTER — Ambulatory Visit: Payer: Medicare Other | Admitting: Primary Care

## 2019-03-24 ENCOUNTER — Encounter: Payer: Self-pay | Admitting: Registered Nurse

## 2019-03-24 ENCOUNTER — Other Ambulatory Visit: Payer: Self-pay

## 2019-03-24 VITALS — BP 132/74 | HR 85 | Temp 97.9°F | Ht 65.0 in | Wt 179.0 lb

## 2019-03-24 DIAGNOSIS — Z79899 Other long term (current) drug therapy: Secondary | ICD-10-CM | POA: Insufficient documentation

## 2019-03-24 DIAGNOSIS — G039 Meningitis, unspecified: Secondary | ICD-10-CM

## 2019-03-24 DIAGNOSIS — Z5181 Encounter for therapeutic drug level monitoring: Secondary | ICD-10-CM

## 2019-03-24 DIAGNOSIS — M5416 Radiculopathy, lumbar region: Secondary | ICD-10-CM | POA: Diagnosis not present

## 2019-03-24 DIAGNOSIS — M62838 Other muscle spasm: Secondary | ICD-10-CM | POA: Diagnosis not present

## 2019-03-24 DIAGNOSIS — G894 Chronic pain syndrome: Secondary | ICD-10-CM | POA: Diagnosis not present

## 2019-03-24 DIAGNOSIS — M7989 Other specified soft tissue disorders: Secondary | ICD-10-CM | POA: Diagnosis not present

## 2019-03-24 DIAGNOSIS — M180 Bilateral primary osteoarthritis of first carpometacarpal joints: Secondary | ICD-10-CM | POA: Diagnosis not present

## 2019-03-24 DIAGNOSIS — M542 Cervicalgia: Secondary | ICD-10-CM

## 2019-03-24 DIAGNOSIS — Z79891 Long term (current) use of opiate analgesic: Secondary | ICD-10-CM

## 2019-03-24 DIAGNOSIS — M5441 Lumbago with sciatica, right side: Secondary | ICD-10-CM | POA: Insufficient documentation

## 2019-03-24 DIAGNOSIS — M961 Postlaminectomy syndrome, not elsewhere classified: Secondary | ICD-10-CM | POA: Insufficient documentation

## 2019-03-24 DIAGNOSIS — M65331 Trigger finger, right middle finger: Secondary | ICD-10-CM | POA: Diagnosis not present

## 2019-03-24 DIAGNOSIS — M546 Pain in thoracic spine: Secondary | ICD-10-CM

## 2019-03-24 MED ORDER — OXYMORPHONE HCL ER 40 MG PO T12A: 40.0000 mg | EXTENDED_RELEASE_TABLET | Freq: Two times a day (BID) | ORAL | 0 refills | Status: DC

## 2019-03-24 MED ORDER — OXYCODONE HCL 10 MG PO TABS: 10.0000 mg | ORAL_TABLET | Freq: Four times a day (QID) | ORAL | 0 refills | Status: DC | PRN

## 2019-03-24 NOTE — Progress Notes (Signed)
Subjective:    Patient ID: [name] [name], female    DOB: Jul 10, 1950, 68 y.o.   MRN: [id number]  HPI: [name] [name] is a 68 y.o. female who returns for follow up appointment for chronic pain and medication refill. She states her pain is located in her upper- lower back pain and bilateral hand pain. She rates her pain 8. Her current exercise regime is walking and performing stretching exercises.  Mr. Steinman Morphine equivalent is 300.00 MME. She is also prescribed Diazepam by Dr. Sherrie Sport. We have discussed the black box warning of using opioids and benzodiazepines. I highlighted the dangers of using these drugs together and discussed the adverse events including respiratory suppression, overdose, cognitive impairment and importance of compliance with current regimen. We will continue to monitor and adjust as indicated.   UDS was ordered today.    Pain Inventory Average Pain 7 Pain Right Now 8 My pain is sharp, burning, stabbing and tingling  In the last 24 hours, has pain interfered with the following? General activity 7 Relation with others 7 Enjoyment of life 7 What TIME of day is your pain at its worst? all Sleep (in general) Fair  Pain is worse with: sitting, inactivity, standing and some activites Pain improves with: rest, heat/ice and medication Relief from Meds: 7  Mobility Do you have any goals in this area?  no  Function Do you have any goals in this area?  no  Neuro/Psych weakness numbness tingling spasms  Prior Studies Any changes since last visit?  no  Physicians involved in your care Any changes since last visit?  no   Family History  Problem Relation Age of Onset  . Hypertension Mother   . Heart disease Mother   . Diabetes Mother   . Cancer Father   . Diabetes Brother    Social History   Socioeconomic History  . Marital status: Divorced    Spouse name: Not on file  . Number of children: Not on file  . Years of education: Not on file  .  Highest education level: Not on file  Occupational History  . Occupation: disable    Employer: DISABLED  Social Needs  . Financial resource strain: Not on file  . Food insecurity    Worry: Not on file    Inability: Not on file  . Transportation needs    Medical: Not on file    Non-medical: Not on file  Tobacco Use  . Smoking status: Never Smoker  . Smokeless tobacco: Never Used  Substance and Sexual Activity  . Alcohol use: No  . Drug use: No  . Sexual activity: Not on file    Comment: second hand smoke  Lifestyle  . Physical activity    Days per week: Not on file    Minutes per session: Not on file  . Stress: Not on file  Relationships  . Social Herbalist on phone: Not on file    Gets together: Not on file    Attends religious service: Not on file    Active member of club or organization: Not on file    Attends meetings of clubs or organizations: Not on file    Relationship status: Not on file  Other Topics Concern  . Not on file  Social History Narrative   Has one child   Disabled   Past Surgical History:  Procedure Laterality Date  . ABDOMINAL HYSTERECTOMY  1987   W/ BSO  . ANTERIOR  CERVICAL DECOMP/DISCECTOMY FUSION N/A 08/09/2012   Procedure: ANTERIOR CERVICAL DECOMPRESSION/DISCECTOMY FUSION 1 LEVEL;  Surgeon: Floyce Stakes, MD;  Location: [facility];  Service: Neurosurgery;  Laterality: N/A;  Cervical four-five  Anterior cervical decompression/diskectomy/fusion  . ANTERIOR FUSION CERVICAL SPINE  2007   C5 -6  . APPENDECTOMY    . APPLICATION OF INTRAOPERATIVE CT SCAN N/A 05/17/2017   Procedure: APPLICATION OF INTRAOPERATIVE CT SCAN;  Surgeon: Erline Levine, MD;  Location: [place];  Service: Neurosurgery;  Laterality: N/A;  . BACK SURGERY     x 5  . BREAST EXCISIONAL BIOPSY Bilateral    No scar seen   . BREAST SURGERY     x 2 biopsies  . CARPAL TUNNEL RELEASE  RIGHT - 2001  & DEC 2011 W/ BACK SURG.  . CERVICAL DISC SURGERY  2005   C5 - 6  .  CHOLECYSTECTOMY  1994  . DORSAL COMPARTMENT RELEASE Right 04/07/2013   Procedure: RIGHT WRIST STS RELEASE;  Surgeon: Schuyler Amor, MD;  Location: [place];  Service: Orthopedics;  Laterality: Right;  . EXPLORATION OF INCISION FOR CSF LEAK  DEC 2011  X2   POST LAMINECOTMY  . FINGER ARTHRODESIS Right 04/07/2013   Procedure: RIGHT INDEX AND RIGHT LONG DISTAL INTERPHALANGEAL JOINT FUSIONS;  Surgeon: Schuyler Amor, MD;  Location: [place];  Service: Orthopedics;  Laterality: Right;  . FINGER ARTHROPLASTY Right 04/07/2013   Procedure: RIGHT THUMB [place] ARTHROPLASTY;  Surgeon: Schuyler Amor, MD;  Location: [place];  Service: Orthopedics;  Laterality: Right;  . GANGLION CYST EXCISION Right 04/07/2013   Procedure: RIGHT WRIST MASS EXCISION;  Surgeon: Schuyler Amor, MD;  Location: [place];  Service: Orthopedics;  Laterality: Right;  . KNEE ARTHROSCOPY  LEFT X3 (LAST ONE 2005)  . KNEE ARTHROSCOPY  05/17/2011   Procedure: ARTHROSCOPY KNEE;  Surgeon: Bradley Ferris III;  Location: [place];  Service: Orthopedics;  Laterality: Right;  WITH MEDIAL MENISECTOMY AND removal of suprapatella fat lump  . LEFT WRIST TENOSYNECTOMY W/ LEFT THUMB JOINT REPAIR  02-24-10  . LUMBAR FUSION  11-30-10   L4 - 5  . LUMBAR FUSION  2000   L2 - 4  . LUMBAR LAMINECTOMY  DEC 2011   L4 - 5  . POSTERIOR CERVICAL FUSION/FORAMINOTOMY N/A 05/17/2017   Procedure: Cervical five  to Thorastic one  Posterior cervical fusion with lateral mass screws/revision of prior instrumentation;  Surgeon: Erline Levine, MD;  Location: [place];  Service: Neurosurgery;  Laterality: N/A;  C5 to T1 Posterior cervical fusion with lateral mass screws/revision of prior instrumentation  . TENDON REPAIR  JAN 2010   LEFT INDEX AND LONG FINGERS  . TUBAL LIGATION     Past Medical History:  Diagnosis Date  . Acute sinusitis, unspecified   . Anemia     "quite a few times"  . Anxiety state, unspecified   . Arachnoiditis BILATERAL LEGS   DUE TO MULTIPLE BACK SURG.'S  . Arthritis   . Asthma    last flare up was 03/2017 lasted over a month  . Blood transfusion   . Cancer ([place])    skin cancers (in scalp)  . Cardiomyopathy HX --06/2010   EF was 25% during acute illness (PHELONEPHRITIS) Repeat echo 12-06-10 60% showed normal EF.   Marland Kitchen Chronic back pain greater than 3 months duration    S/P BACK SURG'S  . CSF leak   . Diabetes mellitus without  complication ([place])    dx 2013 type 2  . Dyslipidemia   . Dysphagia    some post op cerv fusion 2/14  . Dysrhythmia   . Essential hypertension, benign   . Family history of adverse reaction to anesthesia    mother gets n/v  . GERD (gastroesophageal reflux disease) AND HIATIAL HERNIA   CONTROLLED W/ NEXIUM  . Headache(784.0)   . Heart murmur    DENIES S & S   (ECHO JUN'12 W/ CHART)  . History of chronic bronchitis   . Hx of bladder infections   . Hypertension   . Murmur, heart   . Neuromuscular disorder (HCC)    numbness and tingling  . Osteoporosis   . Other malaise and fatigue   . PONV (postoperative nausea and vomiting)   . Shortness of breath   . Spinal headache   . Spinal stenosis, cervical region   . Varicosities    venous  . Weakness of both legs DUE TO ARACHNOIDITIS   OCCASIONAL USES CANE   BP 132/74   Pulse 85   Temp 97.9 F (36.6 C)   Ht 5\' 5"  (1.651 m)   Wt 179 lb (81.2 kg)   SpO2 98%   BMI 29.79 kg/m   Opioid Risk Score:   Fall Risk Score:  `1  Depression screen PHQ 2/9  Depression screen [facility] 2/9 10/30/2018 08/14/2018 03/12/2018 10/25/2017 09/27/2017 07/02/2017 06/04/2017  Decreased Interest 0 0 0 0 0 1 1  Down, Depressed, Hopeless 0 - 0 0 0 1 1  PHQ - 2 Score 0 0 0 0 0 2 2  Altered sleeping - - - - 0 - -  Tired, decreased energy - - - - 0 - -  Change in appetite - - - - 0 - -  Feeling bad or failure about yourself  - - - - 0 - -  Trouble concentrating - - - - 0 -  -  Moving slowly or fidgety/restless - - - - 0 - -  Suicidal thoughts - - - - 0 - -  PHQ-9 Score - - - - 0 - -  Some recent data might be hidden     Review of Systems  Constitutional: Positive for unexpected weight change.  HENT: Negative.   Eyes: Negative.   Respiratory: Negative.   Cardiovascular: Negative.   Gastrointestinal: Negative.   Endocrine: Negative.   Genitourinary: Negative.   Musculoskeletal: Positive for arthralgias, back pain, gait problem and myalgias.  Skin: Negative.   Allergic/Immunologic: Negative.   Neurological: Positive for weakness and numbness.  Hematological: Negative.   Psychiatric/Behavioral: Negative.   All other systems reviewed and are negative.      Objective:   Physical Exam Vitals signs and nursing note reviewed.  Constitutional:      Appearance: Normal appearance.  Neck:     Musculoskeletal: Normal range of motion and neck supple.  Cardiovascular:     Rate and Rhythm: Normal rate and regular rhythm.     Pulses: Normal pulses.     Heart sounds: Normal heart sounds.  Pulmonary:     Effort: Pulmonary effort is normal.     Breath sounds: Normal breath sounds.  Musculoskeletal:     Comments: Normal Muscle Bulk and Muscle Testing Reveals:  Upper Extremities: Full ROM and Muscle Strength 5/5 Bilateral AC Joint Tenderness Thoracic Paraspinal Tenderness: T-7-T-9 Lumbar Paraspinal Tenderness: L-3-L-5 Lower Extremities: Full ROM and Muscle Strength 5/5 Arises from the Table with ease Narrow Based Gait  Skin:    General: Skin is warm and dry.  Neurological:     Mental Status: She is alert and oriented to person, place, and time.  Psychiatric:        Mood and Affect: Mood normal.        Behavior: Behavior normal.           Assessment & Plan:  1. On 05/17/2017 :C5C6C7 T1 Posterior Cervical Fusion with lateral mass fixation with AIRO Imaging, revision of prior instrumentation. By Dr. Vertell Limber.  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 Continuecurrent medication regimen withGabapentin800 mgBID. Refilled: Oxycodone 10mg  one tablet every 6 hours as needed #120tabletsand Oxymorphone HCL 40 mg every 12 hours #60. 03/24/2019 We will continue the opioid monitoring program, this consists of regular clinic visits, examinations, urine drug screen, pill counts as well as use of [place] Controlled Substance Reporting System. 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 03/24/2019 3. Anxiety/depression: Continue Valium, PCP Following. Continueto monitor.03/24/2019 4. Muscle Spasms: Continuecurrent medication regime withFlexeril. Continue to Monitor.03/24/2019 5. Cervicalgia/ Cervical Radiculitis: Continuecurrent medication regime withGabapentin: Dr. Vertell Limber Following: S/P C5C6C7 T1 Posterior Cervical Fusion with lateral mass fixation with AIRO Imaging, revision of prior instrumentation by Dr. Vertell Limber on 05/17/2017. 03/24/2019 7. Bilateral Thoracic Back Pain:Continue HEP as Tolerated and Continue current medication regimen. Continue to Monitor.03/24/2019 8. Left lower extremity DVT/ Phlebitis: S/P endovenous laser ablation of left great saphenous vein on 05/09/2018 by Dr. Scot Dock: Vascular Following. 03/24/2019. 9. Insomnia: Continue Pamelor. Continue to Monitor. 03/24/2019.  75minutes of face to face patient care time was spent during this visit. All questions were encouraged and answered.  F/U in 1 month

## 2019-03-25 ENCOUNTER — Ambulatory Visit: Payer: Medicare Other | Admitting: Registered Nurse

## 2019-04-01 ENCOUNTER — Encounter: Payer: Self-pay | Admitting: Internal Medicine

## 2019-04-01 ENCOUNTER — Other Ambulatory Visit: Payer: Self-pay

## 2019-04-01 ENCOUNTER — Ambulatory Visit (INDEPENDENT_AMBULATORY_CARE_PROVIDER_SITE_OTHER): Payer: Medicare Other | Admitting: Internal Medicine

## 2019-04-01 VITALS — BP 142/96 | HR 102 | Temp 97.1°F | Ht 65.0 in | Wt 178.6 lb

## 2019-04-01 DIAGNOSIS — R490 Dysphonia: Secondary | ICD-10-CM | POA: Diagnosis not present

## 2019-04-01 DIAGNOSIS — R112 Nausea with vomiting, unspecified: Secondary | ICD-10-CM | POA: Diagnosis not present

## 2019-04-01 DIAGNOSIS — Z23 Encounter for immunization: Secondary | ICD-10-CM | POA: Diagnosis not present

## 2019-04-01 DIAGNOSIS — J398 Other specified diseases of upper respiratory tract: Secondary | ICD-10-CM | POA: Diagnosis not present

## 2019-04-01 DIAGNOSIS — J4541 Moderate persistent asthma with (acute) exacerbation: Secondary | ICD-10-CM

## 2019-04-01 DIAGNOSIS — R1310 Dysphagia, unspecified: Secondary | ICD-10-CM | POA: Diagnosis not present

## 2019-04-01 DIAGNOSIS — R634 Abnormal weight loss: Secondary | ICD-10-CM | POA: Diagnosis not present

## 2019-04-01 NOTE — Patient Instructions (Addendum)
ICD-10-CM   1. Flu vaccine need  Z23   2. Dysphagia, unspecified type  R13.10   3. Hoarseness of voice  R49.0   4. Unintentional weight loss  R63.4   5. Nausea and vomiting, intractability of vomiting not specified, unspecified vomiting type  R11.2   6. 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 Dr Juanita Craver  or [facility]    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  - continue inhalers;; pulmopnary problems appear stable - no indication for CT chest currently (last oct 2019) - respect refusal of ABG as followup from June 2020 hypercapnia admission  - test ONO on RA if you are interested   Followup 3-6 months or sooner -

## 2019-04-01 NOTE — Progress Notes (Signed)
OV 04/18/2016  Chief Complaint  Patient presents with   Follow-up    Pt states voice is raspy. Pt c/o of some congestion more in morning, no tightness, and SOB w/ and w/o excertions. Pt states cough comes and goes.     FU asthma - MCT positive - on symbicort FU 81mm lung nodules - passive smoking Chronic voice hoarseness nos  Seeing afer 1 year. In interim has seen others. [place] at beach s[place]p syncope and near cardiac arrest due to dehydratiin per her hx. Since then not the same. Last week having right infrascapular pain that gets worse when she lifts her hand. Also last week or so increased hoarseness of voice is increased cough and wheezing production.She is demanding to have arepeat CT scan of chest for her 66mm nodules.She is extremely worried about cancer.   OV 02/27/2017  Chief Complaint  Patient presents with   Follow-up    6 month f/u, moderate asthma, been doing ok but started wheezing at night when she lays down, allergies causing congestion    Follow-up asthma based on methacholine challenge test positivity and chronic hoarseness of voice  Last seen October 2017. She says for the last 7-8 months she's been having nocturnal wheezing that is waking up at night. But in the daytime she does not have any wheezing. She is also also having dysphagia that is chronic. She was referred to ENT particularly in the context of having to have C-spine surgery. Apparently her  Vocal cords were inflamed and he was then asked to take double dose of Protonix which seemed to help the cough but not fully. This is frustrating her. She struggling with understanding the pathophysiology of this. She's taking Symbicort and singulair regularly. She has lost 15 pounds of weight in the last few weeks intentionally according to her and so she does not think acid reflux is a problem.    asthma control questionnaire shows that at night she is waking up a few times because of perceived asthma symptoms.  When she wakes up she is asymptomatic and she is only very slightly limited in her activities by asthma. She is only experiencing very little shortness of breath because of asthma. She is wheezing only a little of the time at night but is using a lot of albuterol for rescue. 5 point average score is 1  feno is 25 ppb and normal 02/27/2017  She is asking for =- new issue of pre op clearance prior to C spine surgery next month   OV 08/30/2017   Chief Complaint  Patient presents with   Follow-up    Pt states she was in the hospital February 13 in [place] due to pna.  Pt does have current complaints of cough, fatigue, hoarseness, and chest tightness. States she is unsure if she is fully over the pna.      Follow-up asthma based on methacholine challenge test positivity and chronic hoarseness of voice  68 year old female follow-up asthma based on methacholine challenge test and chronic hoarseness of voice.  Last seen fall 2018.  After that she tells me that in February 2019 she got hospitalized after suddenly taking ill.  She shows a blood gas results from the outside showing hypoxemia without hypercapnia.  She was not intubated or treated with BiPAP.  She was treated in the medical floor with oxygen and antibiotics.  She says she had hospital-acquired delirium and was diagnosed with right upper lobe pneumonia.  She was then  discharged after a few days.  Since then she is improving slowly but having significant fatigue review of the lab results from [facility] also shows anemia but otherwise chemistries were normal.  I do not have a chest x-ray for my visualization.  Now she tells me that despite being on inhalers for the last 2 days she is having worsening cough that is dry with white mucus no wheezing no orthopnea no proximal nocturnal dyspnea no fever.  She is worried about the cough.     Walking desaturation test on 08/30/2017 185 feet x 3 laps on ROOM AIR:  did not desaturate. Waklked at slow  moderate pace. Rest pulse ox was 99%, final pulse ox was 94%. HR response 96/min at rest to 101/min at peak exertion. Patient [name] [name]  Did not Desaturate < 88% . [name] [name] yes did  Desaturated </= 3% points. [name] [name] yes did get tachyardic. Got fatigued but only mildly     OV 03/28/2018  Subjective:  Patient ID: [name] [name], female , DOB: 1950-12-24 , age 68 y.o. , MRN: [id number] , ADDRESS: [place] [address]   03/28/2018 -   Chief Complaint  Patient presents with   Follow-up    still not feeling well- retaining fluid in leg - refused weight    Follow-up asthma based on methacholine challenge test positivity and chronic hoarseness of voice  HPI [name] [name] 68 y.o. -presents for routine follow-up.  She tells me that through the summer 2019 she has had 2 course of antibiotics and through a full course of prednisone for respiratory exacerbation.  She says she has chronic shortness of breath with exertion relieved by rest without associated chest pain.  She feels that the course is been progressive.  Moderate in intensity occasionally severe.  In addition she has nocturnal wheezing that is waking her up.  This despite using Xopenex and Entocort which seemed to help her.  In addition for the last 8 months or so she is reporting left lower extremity edema that is worse than the right side.  She has tried compression stockings without relief.  This is new report to me.  She says primary care is not referring her to cardiology.  She thinks that overall she is volume overloaded because of the symptom.  She had a stress test several years ago that was normal.  In addition is also reporting chronic hoarseness of voice that is worse.  Last visit with ENT was with Dr. Redmond Baseman approximately 1 year ago.  She still has not followed up with him.  Exam nitric oxide today is 13 ppb and normal asthma control questionnaire with significant amount of symptomatology.     ROS -  per HPI     OV 07/18/2018  Subjective:  Patient ID: [name] [name], female , DOB: October 01, 1950 , age 68 y.o. , MRN: [id number] , ADDRESS: [address] [place] [address]   07/18/2018 -   Chief Complaint  Patient presents with   Follow-up    Pt states she is still coughing with thick phlegm and is also hoarse due to her husband walking past her smoking a cigarette. Pt also states she is fatigued, dizzy, and has lost her taste and due to that is not eating that much. Pt states she has to use her xopenex inhaler about 4 times daily.   Follow-up asthma based on methacholine challenge test positivity and tracheobronchomalacia on CT OCt 2019 Fpollowup  chronic hoarseness of voice Foollowup chronic benign nodule oct 2019  HPI [name] [name] 68 y.o. -returns for follow-up.  Last seen by myself in October 2019.  After that she has had 3 visits with our nurse practitioners.  Each time for worsening respiratory symptoms and hoarseness of voice.  Each time getting prednisone.  Today she returns and has significant amount of respiratory symptoms.  However she is categorical that these are not changed in the last few weeks of few days in several days.  She says they are all stable.  But the symptom level burden is extremely high.  She is waking up several times at night.  When she wakes up she has mild symptoms.  She is moderately limited in her activities.  She has a little shortness of breath.  Wheezing moderate amount of the time and using albuterol for rescue multiple times daily.  This is despite being on chronic stable inhalers.  Of note, her CT scan in October 2019 showed tracheobronchomalacia.  So we sent her back to Dr. Redmond Baseman in ENT.  She says she has seen him.  I do not have the report with me.  Apparently the vocal cords were red.  Only supportive care advised.  In addition now she is having a new complaint of chronic dysphagia for the last 1 year.  And that she get stuck with mashed potato.   Her last GI evaluation was 10 years ago in [place].  At that time she did not have these current problems.  She is willing to see GI at this point.  She is also open to having a tertiary care referral.     Results for NIXON, RULLI (MRN [id number])  Ref. Range 12/24/2014 16:53 08/30/2017  07/18/2018   Nitric Oxide Unknown 27 Could not perform 14 ppb   IMPRESSION: 1. No findings to suggest interstitial lung disease. 2. Moderate air trapping indicative of small airways disease. 3. 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 4. Innumerable tiny 1-2 mm pulmonary nodules scattered throughout the lungs bilaterally, stable dating back to 2017, considered definitively benign. 5. Hepatic steatosis.   Electronically Signed   By: Vinnie Langton M.D.   On: 04/12/2018 11:09  ROS - per HPI   OV 04/01/2019  Subjective:  Patient ID: [name] [name], female , DOB: 07-06-1950 , age 33 y.o. , MRN: [id number] , ADDRESS: [place] [address]   04/01/2019 -   Chief Complaint  Patient presents with   Follow-up    Pt states overall she feels her breathing is unchanged since last OV in 12/2018. However, pt c/o desats to high 80's when lyning down for bed, pt doesnt lay flat. Pt also c/o daily nausea and a couple episodes of vomiting in the last 2 weeks. Pt states she has lost 6 lbs over the last couple weeks. Pt denies cough, CP/tightness and f/c/s.    Follow-up asthma based on methacholine challenge test positivity and tracheobronchomalacia on CT OCt 2019 Fpollowup chronic hoarseness of voice with associated red vocal cords per history of examination by Dr. Redmond Baseman ENT 2019 Foollowup chronic benign nodule oct 2019  HPI [name] [name] 68 y.o. -returns for follow-up.  Last seen January 2020 and then lost to follow-up because of the pandemic.  In the interim in June 2020 she got admitted for hypercapnia at [facility].  She was discharged a day later.  Never got intubated.   Apparently she responded to Narcan.  Heavy pain medication opioid use was  documented but she says this was not a overdose admission.  She refuses to have blood gas test again.  Currently overall she is feeling well.  However she is lost 6 pounds of weight unintentionally.  And she is having intermittent nausea and vomiting of moderate intensity.  Review of medication shows polypharmacy associated with multiple opioid medications but she does not think this is the reason.  She is worried about malignancy.  She is asking for a CT scan of the chest for GI symptoms.  Most recent CT scan of the chest was in October 2019 and other than chronic stable benign nodules no ILD or other findings.  Of note she is also having dysphagia that is chronic.  I referred her to GI in early 2020 but because of the pandemic this did not happen.  She is willing to get referred to GI again.  She also thinks her pulse ox drops at night.     ROS - per HPI     has a past medical history of Acute sinusitis, unspecified, Anemia, Anxiety state, unspecified, Arachnoiditis (BILATERAL LEGS), Arthritis, Asthma, Blood transfusion, Cancer ([place]), Cardiomyopathy (HX --06/2010), Chronic back pain greater than 3 months duration, CSF leak, Diabetes mellitus without complication ([place]), Dyslipidemia, Dysphagia, Dysrhythmia, Essential hypertension, benign, Family history of adverse reaction to anesthesia, GERD (gastroesophageal reflux disease) (AND HIATIAL HERNIA), Headache(784.0), Heart murmur, History of chronic bronchitis, bladder infections, Hypertension, Murmur, heart, Neuromuscular disorder ([place]), Osteoporosis, Other malaise and fatigue, PONV (postoperative nausea and vomiting), Shortness of breath, Spinal headache, Spinal stenosis, cervical region, Varicosities, and Weakness of both legs (DUE TO ARACHNOIDITIS).   reports that she has never smoked. She has never used smokeless tobacco.  Past Surgical History:  Procedure Laterality Date    ABDOMINAL HYSTERECTOMY  1987   W/ BSO   ANTERIOR CERVICAL DECOMP/DISCECTOMY FUSION N/A 08/09/2012   Procedure: ANTERIOR CERVICAL DECOMPRESSION/DISCECTOMY FUSION 1 LEVEL;  Surgeon: Floyce Stakes, MD;  Location: [facility];  Service: Neurosurgery;  Laterality: N/A;  Cervical four-five  Anterior cervical decompression/diskectomy/fusion   ANTERIOR FUSION CERVICAL SPINE  2007   C5 -6   APPENDECTOMY     APPLICATION OF INTRAOPERATIVE CT SCAN N/A 05/17/2017   Procedure: APPLICATION OF INTRAOPERATIVE CT SCAN;  Surgeon: Erline Levine, MD;  Location: [facility];  Service: Neurosurgery;  Laterality: N/A;   BACK SURGERY     x 5   BREAST EXCISIONAL BIOPSY Bilateral    No scar seen    BREAST SURGERY     x 2 biopsies   CARPAL TUNNEL RELEASE  RIGHT - 2001  & DEC 2011 W/ BACK SURG.   CERVICAL DISC SURGERY  2005   C5 - 6   CHOLECYSTECTOMY  1994   DORSAL COMPARTMENT RELEASE Right 04/07/2013   Procedure: RIGHT WRIST STS RELEASE;  Surgeon: Schuyler Amor, MD;  Location: [facility];  Service: Orthopedics;  Laterality: Right;   EXPLORATION OF INCISION FOR CSF LEAK  DEC 2011  X2   POST LAMINECOTMY   FINGER ARTHRODESIS Right 04/07/2013   Procedure: RIGHT INDEX AND RIGHT LONG DISTAL INTERPHALANGEAL JOINT FUSIONS;  Surgeon: Schuyler Amor, MD;  Location: [facility];  Service: Orthopedics;  Laterality: Right;   FINGER ARTHROPLASTY Right 04/07/2013   Procedure: RIGHT THUMB [place] ARTHROPLASTY;  Surgeon: Schuyler Amor, MD;  Location: [facility];  Service: Orthopedics;  Laterality: Right;   GANGLION CYST EXCISION Right 04/07/2013   Procedure: RIGHT WRIST MASS EXCISION;  Surgeon: Sheral Apley  Burney Gauze, MD;  Location: [facility];  Service: Orthopedics;  Laterality: Right;   KNEE ARTHROSCOPY  LEFT X3 (LAST ONE 2005)   KNEE ARTHROSCOPY  05/17/2011   Procedure: ARTHROSCOPY KNEE;  Surgeon: Bradley Ferris III;  Location: [facility];  Service: Orthopedics;  Laterality: Right;  WITH MEDIAL MENISECTOMY AND removal of suprapatella fat lump   LEFT WRIST TENOSYNECTOMY W/ LEFT THUMB JOINT REPAIR  02-24-10   LUMBAR FUSION  11-30-10   L4 - 5   LUMBAR FUSION  2000   L2 - 4   LUMBAR LAMINECTOMY  DEC 2011   L4 - 5   POSTERIOR CERVICAL FUSION/FORAMINOTOMY N/A 05/17/2017   Procedure: Cervical five  to Thorastic one  Posterior cervical fusion with lateral mass screws/revision of prior instrumentation;  Surgeon: Erline Levine, MD;  Location: [facility];  Service: Neurosurgery;  Laterality: N/A;  C5 to T1 Posterior cervical fusion with lateral mass screws/revision of prior instrumentation   TENDON REPAIR  JAN 2010   LEFT INDEX AND LONG FINGERS   TUBAL LIGATION      Allergies  Allergen Reactions   Latex Itching and Rash   Morphine And Related Hives and Itching   Aspirin Nausea And Vomiting    Can take coated asa   Biaxin [Clarithromycin] Nausea And Vomiting   Clarithromycin Diarrhea   Codeine Itching   Darvocet [Propoxyphene N-Acetaminophen] Itching   Hydrocodone-Acetaminophen Itching   Lortab [Hydrocodone-Acetaminophen] Itching   Percocet [Oxycodone-Acetaminophen] Itching   Tramadol Nausea Only    Immunization History  Administered Date(s) Administered   Influenza Split 04/19/2013, 03/18/2014   Influenza, High Dose Seasonal PF 04/25/2018   Influenza,inj,Quad PF,6+ Mos 05/04/2015, 02/27/2017   Pneumococcal Polysaccharide-23 04/19/2013   Zoster 05/30/2013    Family History  Problem Relation Age of Onset   Hypertension Mother    Heart disease Mother    Diabetes Mother    Cancer Father    Diabetes Brother      Current Outpatient Medications:    alendronate (FOSAMAX) 70 MG tablet, Take 70 mg by mouth every Wednesday. Take with a full glass of water on an empty stomach. , Disp: , Rfl:    aspirin EC 81 MG tablet, Take 81 mg by mouth at bedtime. , Disp: , Rfl:     budesonide-formoterol (SYMBICORT) 80-4.5 MCG/ACT inhaler, Inhale 2 puffs into the lungs 2 (two) times daily., Disp: 1 Inhaler, Rfl: 6   Calcium Carbonate-Vit D-Min 1200-1000 MG-UNIT CHEW, Chew 1 tablet by mouth daily. , Disp: , Rfl:    diltiazem (CARDIZEM CD) 240 MG 24 hr capsule, Take 240 mg by mouth daily., Disp: , Rfl:    estradiol (ESTRACE) 2 MG tablet, Take 2 mg by mouth daily. , Disp: , Rfl:    gabapentin (NEURONTIN) 800 MG tablet, Take 1 tablet (800 mg total) by mouth 4 (four) times daily. (Patient taking differently: Take 800 mg by mouth daily. ), Disp: 120 tablet, Rfl: 5   levalbuterol (XOPENEX HFA) 45 MCG/ACT inhaler, Inhale 1-2 puffs into the lungs every 4 (four) hours as needed. For shortness of breath, Disp: 1 Inhaler, Rfl: 6   losartan (COZAAR) 25 MG tablet, Take 25 mg by mouth daily., Disp: , Rfl:    metFORMIN (GLUCOPHAGE) 500 MG tablet, Take 500 mg by mouth 2 (two) times daily with a meal. , Disp: , Rfl:    montelukast (SINGULAIR) 10 MG tablet, Take 10 mg by mouth at bedtime., Disp: , Rfl:    naloxone (NARCAN) nasal  spray 4 mg/0.1 mL, Place 1 spray into the nose once as needed (Just in case of Opiod Overdose)., Disp: , Rfl:    nortriptyline (PAMELOR) 25 MG capsule, Take 1 capsule (25 mg total) by mouth at bedtime., Disp: 30 capsule, Rfl: 2   omeprazole (PRILOSEC) 20 MG capsule, Take 20 mg by mouth daily., Disp: , Rfl:    Oxycodone HCl 10 MG TABS, Take 1 tablet (10 mg total) by mouth 4 (four) times daily as needed., Disp: 120 tablet, Rfl: 0   Oxymorphone HCl, Crush Resist, 40 MG PO T12A, Take 40 mg by mouth 2 (two) times daily., Disp: 60 tablet, Rfl: 0   simvastatin (ZOCOR) 10 MG tablet, Take 10 mg by mouth daily., Disp: , Rfl:    valACYclovir (VALTREX) 500 MG tablet, Take 500 mg by mouth daily. , Disp: , Rfl:    vitamin B-12 (CYANOCOBALAMIN) 1000 MCG tablet, Take 1,000 mcg by mouth daily. , Disp: , Rfl:    diazepam (VALIUM) 10 MG tablet, Take 5 mg by mouth See admin  instructions. , Disp: , Rfl:    Milk Thistle 200 MG CAPS, Take 1 capsule by mouth daily., Disp: , Rfl:    Omega-3 Fatty Acids (FISH OIL) 1000 MG CAPS, Take 1 capsule by mouth 3 (three) times daily., Disp: , Rfl:  No current facility-administered medications for this visit.   Facility-Administered Medications Ordered in Other Visits:    levalbuterol (XOPENEX) nebulizer solution 0.63 mg, 0.63 mg, Nebulization, Once, Parrett, Tammy S, NP      Objective:   Vitals:   04/01/19 1336  BP: (!) 142/96  Pulse: (!) 102  Temp: (!) 97.1 F (36.2 C)  TempSrc: Skin  SpO2: 97%  Weight: 178 lb 9.6 oz (81 kg)  Height: 5\' 5"  (1.651 m)    Estimated body mass index is 29.72 kg/m as calculated from the following:   Height as of this encounter: 5\' 5"  (1.651 m).   Weight as of this encounter: 178 lb 9.6 oz (81 kg).  @WEIGHTCHANGE @  [employer]   04/01/19 1336  Weight: 178 lb 9.6 oz (81 kg)     Physical Exam  General Appearance:    Alert, cooperative, no distress, appears stated age - yes , Deconditioned looking - no , OBESE  - no, Sitting on Wheelchair -  no  Head:    Normocephalic, without obvious abnormality, atraumatic  Eyes:    PERRL, conjunctiva/corneas clear,  Ears:    Normal TM's and external ear canals, both ears  Nose:   Nares normal, septum midline, mucosa normal, no drainage    or sinus tenderness. OXYGEN ON  - no . Patient is @ ra   Throat:   Lips, mucosa, and tongue normal; teeth and gums normal. Cyanosis on lips - no  Neck:   Supple, symmetrical, trachea midline, no adenopathy;    thyroid:  no enlargement/tenderness/nodules; no carotid   bruit or JVD  Back:     Symmetric, no curvature, ROM normal, no CVA tenderness  Lungs:     Distress - no , Wheeze no, Barrell Chest - no, Purse lip breathing - no, Crackles - no   Chest Wall:    No tenderness or deformity.    Heart:    Regular rate and rhythm, S1 and S2 normal, no rub   or gallop, Murmur - no  Breast Exam:    NOT DONE    Abdomen:     Soft, non-tender, bowel sounds active all four quadrants,  no masses, no organomegaly. Visceral obesity - yes  Genitalia:   NOT DONE  Rectal:   NOT DONE  Extremities:   Extremities - normal, Has Cane - no, Clubbing - no, Edema - no  Pulses:   2+ and symmetric all extremities  Skin:   Stigmata of Connective Tissue Disease - no  Lymph nodes:   Cervical, supraclavicular, and axillary nodes normal  Psychiatric:  Neurologic:   Pleasant - yes, Anxious - yes, Flat affect - yes mild  CAm-ICU - neg, Alert and Oriented x 3 - yes, Moves all 4s - yes, Speech - normal, Cognition - intact           Assessment:       ICD-10-CM   1. Flu vaccine need  Z23   2. Dysphagia, unspecified type  R13.10   3. Hoarseness of voice  R49.0   4. Unintentional weight loss  R63.4   5. Nausea and vomiting, intractability of vomiting not specified, unspecified vomiting type  R11.2   6. Moderate persistent asthma with acute exacerbation  J45.41   7. Tracheobronchomalacia  J39.8        Plan:     Patient Instructions     ICD-10-CM   1. Flu vaccine need  Z23   2. Dysphagia, unspecified type  R13.10   3. Hoarseness of voice  R49.0   4. Unintentional weight loss  R63.4   5. Nausea and vomiting, intractability of vomiting not specified, unspecified vomiting type  R11.2   6. Moderate persistent asthma with acute exacerbation  J45.41   7. Tracheobronchomalacia  J39.8     Flu vaccine  - have high dose flu shot 04/01/2019   Dysphagia, unspecified type Weight loss Nausea and Vomiting Hoarse voice  - re-refer GI - [facility]. Dr Juanita Craver  or [facility]    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  - continue inhalers;; pulmopnary problems appear stable - no indication for CT chest currently (last oct 2019) - respect refusal of ABG as followup from June 2020 hypercapnia admission  - test ONO on RA if you are interested   Followup 3-6 months or sooner  -       SIGNATURE    Dr. Brand Males, M.D., F.C.C.P,  Pulmonary and Critical Care Medicine Staff Physician, [place] Director - Interstitial Lung Disease  Program  Pulmonary [place] [facility] [place], [place], [address]  Pager: [phone], If no answer or between  15:00h - 7:00h: call [phone] Telephone: [phone]  2:03 PM 04/01/2019

## 2019-04-01 NOTE — Addendum Note (Signed)
Addended by: Collier Salina on: 04/01/2019 02:07 PM   Modules accepted: Orders

## 2019-04-07 DIAGNOSIS — M19041 Primary osteoarthritis, right hand: Secondary | ICD-10-CM | POA: Diagnosis not present

## 2019-04-07 DIAGNOSIS — M65331 Trigger finger, right middle finger: Secondary | ICD-10-CM | POA: Diagnosis not present

## 2019-04-07 HISTORY — PX: OTHER SURGICAL HISTORY: SHX169

## 2019-04-10 ENCOUNTER — Encounter: Payer: Self-pay | Admitting: Gastroenterology

## 2019-04-10 ENCOUNTER — Ambulatory Visit: Payer: Medicare Other | Admitting: Vascular Surgery

## 2019-04-10 ENCOUNTER — Encounter (HOSPITAL_COMMUNITY): Payer: Medicare Other

## 2019-04-10 NOTE — Telephone Encounter (Signed)
done

## 2019-04-14 DIAGNOSIS — M7989 Other specified soft tissue disorders: Secondary | ICD-10-CM | POA: Diagnosis not present

## 2019-04-14 DIAGNOSIS — M180 Bilateral primary osteoarthritis of first carpometacarpal joints: Secondary | ICD-10-CM | POA: Diagnosis not present

## 2019-04-17 ENCOUNTER — Telehealth: Payer: Self-pay | Admitting: Gastroenterology

## 2019-04-17 NOTE — Telephone Encounter (Signed)
Pt returned Robin's call from yesterday. Pls call her again.

## 2019-04-17 NOTE — Telephone Encounter (Signed)
Returned patients call. I was not able to find out why Robbin called the patient. Patient was informed that we would call her tomorrow when Laurine Blazer is back in the office.   Riki Sheer, LPN ( PV )

## 2019-04-18 NOTE — Telephone Encounter (Signed)
Spoke with the patient and made her Covid test on 11/10. Pt will come in on 11/4 for PV.

## 2019-04-23 ENCOUNTER — Ambulatory Visit (AMBULATORY_SURGERY_CENTER): Payer: Self-pay | Admitting: *Deleted

## 2019-04-23 ENCOUNTER — Encounter: Payer: Self-pay | Admitting: Registered Nurse

## 2019-04-23 ENCOUNTER — Encounter: Payer: Medicare Other | Attending: Physical Medicine & Rehabilitation | Admitting: Registered Nurse

## 2019-04-23 ENCOUNTER — Other Ambulatory Visit: Payer: Self-pay

## 2019-04-23 VITALS — Temp 96.6°F | Ht 65.0 in | Wt 173.6 lb

## 2019-04-23 VITALS — BP 125/65 | HR 98 | Ht 65.0 in | Wt 173.0 lb

## 2019-04-23 DIAGNOSIS — G894 Chronic pain syndrome: Secondary | ICD-10-CM | POA: Diagnosis not present

## 2019-04-23 DIAGNOSIS — G039 Meningitis, unspecified: Secondary | ICD-10-CM

## 2019-04-23 DIAGNOSIS — M961 Postlaminectomy syndrome, not elsewhere classified: Secondary | ICD-10-CM | POA: Diagnosis present

## 2019-04-23 DIAGNOSIS — Z5181 Encounter for therapeutic drug level monitoring: Secondary | ICD-10-CM | POA: Diagnosis not present

## 2019-04-23 DIAGNOSIS — Z79899 Other long term (current) drug therapy: Secondary | ICD-10-CM | POA: Diagnosis not present

## 2019-04-23 DIAGNOSIS — Z79891 Long term (current) use of opiate analgesic: Secondary | ICD-10-CM

## 2019-04-23 DIAGNOSIS — M62838 Other muscle spasm: Secondary | ICD-10-CM | POA: Diagnosis not present

## 2019-04-23 DIAGNOSIS — M5412 Radiculopathy, cervical region: Secondary | ICD-10-CM | POA: Diagnosis not present

## 2019-04-23 DIAGNOSIS — M5416 Radiculopathy, lumbar region: Secondary | ICD-10-CM | POA: Diagnosis not present

## 2019-04-23 DIAGNOSIS — M542 Cervicalgia: Secondary | ICD-10-CM

## 2019-04-23 DIAGNOSIS — M546 Pain in thoracic spine: Secondary | ICD-10-CM | POA: Diagnosis not present

## 2019-04-23 DIAGNOSIS — M5441 Lumbago with sciatica, right side: Secondary | ICD-10-CM | POA: Insufficient documentation

## 2019-04-23 DIAGNOSIS — R1310 Dysphagia, unspecified: Secondary | ICD-10-CM

## 2019-04-23 MED ORDER — OXYCODONE HCL 10 MG PO TABS: 10.0000 mg | ORAL_TABLET | Freq: Four times a day (QID) | ORAL | 0 refills | Status: DC | PRN

## 2019-04-23 MED ORDER — OXYMORPHONE HCL ER 40 MG PO T12A: 40.0000 mg | EXTENDED_RELEASE_TABLET | Freq: Two times a day (BID) | ORAL | 0 refills | Status: DC

## 2019-04-23 NOTE — Progress Notes (Signed)
Subjective:    Patient ID: [name] [name], female    DOB: December 14, 1950, 68 y.o.   MRN: [id number]  HPI: [name] [name] is a 68 y.o. female who returns for follow up appointment for chronic pain and medication refill. She states her pain is located in her right hand and right thumb post surgical pain s/p right thumb interphalangeal joint arthrodesis and right long finger A1 pulley release by Dr. Burney Gauze, wearing right thumb spica splint. Also reports pain in her neck radiating into her bilateral shoulders and upper- lower back pain radiating into her bilateral lower extremities. She rates her pain 8. Her current exercise regime is walking and performing stretching exercises.  [name] Morphine equivalent is 300.00 MME. She is also prescribed Diazepam  by Dr. Sherrie Sport, using it sparingly. Last fill date was on 11/15/2018. We have discussed the black box warning of using opioids and benzodiazepines. I highlighted the dangers of using these drugs together and discussed the adverse events including respiratory suppression, overdose, cognitive impairment and importance of compliance with current regimen. We will continue to monitor and adjust as indicated.     UDS ordered today   Pain Inventory Average Pain 7 Pain Right Now 8 My pain is constant, sharp, burning, stabbing and aching  In the last 24 hours, has pain interfered with the following? General activity 7 Relation with others 7 Enjoyment of life 7 What TIME of day is your pain at its worst? all Sleep (in general) Fair  Pain is worse with: bending, sitting, standing and some activites Pain improves with: heat/ice, pacing activities and medication Relief from Meds: 7  Mobility Do you have any goals in this area?  no  Function Do you have any goals in this area?  no  Neuro/Psych No problems in this area  Prior Studies Any changes since last visit?  no  Physicians involved in your care Any changes since last visit?   no   Family History  Problem Relation Age of Onset   Hypertension Mother    Heart disease Mother    Diabetes Mother    Cancer Father    Diabetes Brother    Social History   Socioeconomic History   Marital status: Divorced    Spouse name: Not on file   Number of children: Not on file   Years of education: Not on file   Highest education level: Not on file  Occupational History   Occupation: disable    Employer: DISABLED  Social Designer, fashion/clothing strain: Not on file   Food insecurity    Worry: Not on file    Inability: Not on file   Transportation needs    Medical: Not on file    Non-medical: Not on file  Tobacco Use   Smoking status: Never Smoker   Smokeless tobacco: Never Used  Substance and Sexual Activity   Alcohol use: No   Drug use: No   Sexual activity: Not on file    Comment: second hand smoke  Lifestyle   Physical activity    Days per week: Not on file    Minutes per session: Not on file   Stress: Not on file  Relationships   Social connections    Talks on phone: Not on file    Gets together: Not on file    Attends religious service: Not on file    Active member of club or organization: Not on file    Attends meetings of clubs  or organizations: Not on file    Relationship status: Not on file  Other Topics Concern   Not on file  Social History Narrative   Has one child   Disabled   Past Surgical History:  Procedure Laterality Date   ABDOMINAL HYSTERECTOMY  1987   W/ BSO   ANTERIOR CERVICAL DECOMP/DISCECTOMY FUSION N/A 08/09/2012   Procedure: ANTERIOR CERVICAL DECOMPRESSION/DISCECTOMY FUSION 1 LEVEL;  Surgeon: Floyce Stakes, MD;  Location: [facility];  Service: Neurosurgery;  Laterality: N/A;  Cervical four-five  Anterior cervical decompression/diskectomy/fusion   ANTERIOR FUSION CERVICAL SPINE  2007   C5 -6   APPENDECTOMY     APPLICATION OF INTRAOPERATIVE CT SCAN N/A 05/17/2017   Procedure: APPLICATION OF  INTRAOPERATIVE CT SCAN;  Surgeon: Erline Levine, MD;  Location: [place];  Service: Neurosurgery;  Laterality: N/A;   BACK SURGERY     x 5   BREAST EXCISIONAL BIOPSY Bilateral    No scar seen    BREAST SURGERY     x 2 biopsies   CARPAL TUNNEL RELEASE  RIGHT - 2001  & DEC 2011 W/ BACK SURG.   CERVICAL DISC SURGERY  2005   C5 - 6   CHOLECYSTECTOMY  1994   DORSAL COMPARTMENT RELEASE Right 04/07/2013   Procedure: RIGHT WRIST STS RELEASE;  Surgeon: Schuyler Amor, MD;  Location: [place];  Service: Orthopedics;  Laterality: Right;   EXPLORATION OF INCISION FOR CSF LEAK  DEC 2011  X2   POST LAMINECOTMY   FINGER ARTHRODESIS Right 04/07/2013   Procedure: RIGHT INDEX AND RIGHT LONG DISTAL INTERPHALANGEAL JOINT FUSIONS;  Surgeon: Schuyler Amor, MD;  Location: [place];  Service: Orthopedics;  Laterality: Right;   FINGER ARTHROPLASTY Right 04/07/2013   Procedure: RIGHT THUMB [place] ARTHROPLASTY;  Surgeon: Schuyler Amor, MD;  Location: [place];  Service: Orthopedics;  Laterality: Right;   GANGLION CYST EXCISION Right 04/07/2013   Procedure: RIGHT WRIST MASS EXCISION;  Surgeon: Schuyler Amor, MD;  Location: [place];  Service: Orthopedics;  Laterality: Right;   KNEE ARTHROSCOPY  LEFT X3 (LAST ONE 2005)   KNEE ARTHROSCOPY  05/17/2011   Procedure: ARTHROSCOPY KNEE;  Surgeon: Bradley Ferris III;  Location: [place];  Service: Orthopedics;  Laterality: Right;  WITH MEDIAL MENISECTOMY AND removal of suprapatella fat lump   LEFT WRIST TENOSYNECTOMY W/ LEFT THUMB JOINT REPAIR  02-24-10   LUMBAR FUSION  11-30-10   L4 - 5   LUMBAR FUSION  2000   L2 - 4   LUMBAR LAMINECTOMY  DEC 2011   L4 - 5   POSTERIOR CERVICAL FUSION/FORAMINOTOMY N/A 05/17/2017   Procedure: Cervical five  to Thorastic one  Posterior cervical fusion with lateral mass screws/revision of prior instrumentation;  Surgeon:  Erline Levine, MD;  Location: [place];  Service: Neurosurgery;  Laterality: N/A;  C5 to T1 Posterior cervical fusion with lateral mass screws/revision of prior instrumentation   TENDON REPAIR  JAN 2010   LEFT INDEX AND LONG FINGERS   TUBAL LIGATION     Past Medical History:  Diagnosis Date   Acute sinusitis, unspecified    Anemia    "quite a few times"   Anxiety state, unspecified    Arachnoiditis BILATERAL LEGS   DUE TO MULTIPLE BACK SURG.'S   Arthritis    Asthma    last flare up was 03/2017 lasted over a month   Blood transfusion    Cancer (  [place])    skin cancers (in scalp)   Cardiomyopathy HX --06/2010   EF was 25% during acute illness (PHELONEPHRITIS) Repeat echo 12-06-10 60% showed normal EF.    Chronic back pain greater than 3 months duration    S/P BACK SURG'S   CSF leak    Diabetes mellitus without complication ([place])    dx 2013 type 2   Dyslipidemia    Dysphagia    some post op cerv fusion 2/14   Dysrhythmia    Essential hypertension, benign    Family history of adverse reaction to anesthesia    mother gets n/v   GERD (gastroesophageal reflux disease) AND HIATIAL HERNIA   CONTROLLED W/ NEXIUM   Headache(784.0)    Heart murmur    DENIES S & S   (ECHO JUN'12 W/ CHART)   History of chronic bronchitis    Hx of bladder infections    Hypertension    Murmur, heart    Neuromuscular disorder (HCC)    numbness and tingling   Osteoporosis    Other malaise and fatigue    PONV (postoperative nausea and vomiting)    Shortness of breath    Spinal headache    Spinal stenosis, cervical region    Varicosities    venous   Weakness of both legs DUE TO ARACHNOIDITIS   OCCASIONAL USES CANE   BP 125/65    Pulse 98    Ht 5\' 5"  (1.651 m)    Wt 173 lb (78.5 kg)    SpO2 97%    BMI 28.79 kg/m   Opioid Risk Score:   Fall Risk Score:  `1  Depression screen PHQ 2/9  Depression screen [facility] 2/9 10/30/2018 08/14/2018 03/12/2018 10/25/2017 09/27/2017  07/02/2017 06/04/2017  Decreased Interest 0 0 0 0 0 1 1  Down, Depressed, Hopeless 0 - 0 0 0 1 1  PHQ - 2 Score 0 0 0 0 0 2 2  Altered sleeping - - - - 0 - -  Tired, decreased energy - - - - 0 - -  Change in appetite - - - - 0 - -  Feeling bad or failure about yourself  - - - - 0 - -  Trouble concentrating - - - - 0 - -  Moving slowly or fidgety/restless - - - - 0 - -  Suicidal thoughts - - - - 0 - -  PHQ-9 Score - - - - 0 - -  Some recent data might be hidden    Review of Systems  Constitutional: Negative.   HENT: Negative.   Eyes: Negative.   Respiratory: Negative.   Cardiovascular: Negative.   Gastrointestinal: Negative.   Endocrine: Negative.   Genitourinary: Negative.   Musculoskeletal: Positive for back pain and neck pain.  Allergic/Immunologic: Negative.   Neurological: Negative.   Hematological: Negative.   Psychiatric/Behavioral: Negative.   All other systems reviewed and are negative.      Objective:   Physical Exam Vitals signs and nursing note reviewed.  Constitutional:      Appearance: Normal appearance.  Neck:     Musculoskeletal: Normal range of motion and neck supple.     Comments: Cervical Paraspinal Tenderness: C-5-C-6 Cardiovascular:     Rate and Rhythm: Normal rate and regular rhythm.  Pulmonary:     Effort: Pulmonary effort is normal.     Breath sounds: Normal breath sounds.  Musculoskeletal:     Comments: Normal Muscle Bulk and Muscle Testing Reveals:  Upper Extremities: Full ROM  and Muscle Strength 5/5 Bilateral AC Joint Tenderness  Thoracic and Lumbar Hypersensitivity  Lower Extremities: Full ROM and Muscle Strength 5/5 Arises from Table with ease Narrow Based  Gait   Skin:    General: Skin is warm and dry.  Neurological:     Mental Status: She is alert and oriented to person, place, and time.  Psychiatric:        Mood and Affect: Mood normal.        Behavior: Behavior normal.           Assessment & Plan:  1. On 05/17/2017  :C5C6C7 T1 Posterior Cervical Fusion with lateral mass fixation with AIRO Imaging, revision of prior instrumentation. By Dr. Vertell Limber.  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 Continuecurrent medication regimen withGabapentin800 mgBID. Refilled:Oxycodone 10mg  one tablet every 6 hours as needed #120tabletsand Oxymorphone HCL 40 mg every 12 hours #60. 04/23/2019 We will continue the opioid monitoring program, this consists of regular clinic visits, examinations, urine drug screen, pill counts as well as use of [place] Controlled Substance Reporting System. 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 04/23/2019 3. Anxiety/depression: Continue Valium, PCP Following. Continueto monitor.04/23/2019 4. Muscle Spasms: Continuecurrent medication regime withFlexeril. Continue to Monitor.04/23/2019 5. Cervicalgia/ Cervical Radiculitis: Continuecurrent medication regime withGabapentin: Dr. Vertell Limber Following: S/P C5C6C7 T1 Posterior Cervical Fusion with lateral mass fixation with AIRO Imaging, revision of prior instrumentation by Dr. Vertell Limber on 05/17/2017. 04/23/2019 7. Bilateral Thoracic Back Pain:Continue HEP as Tolerated and Continue current medication regimen. Continue to Monitor.04/23/2019 8. Left lower extremity DVT/ Phlebitis: S/P endovenous laser ablation of left great saphenous vein on 05/09/2018 by Dr. Scot Dock: Vascular Following. 04/23/2019. 9. Insomnia:ContinuePamelor. Continue to Monitor.04/23/2019.  38minutes of face to face patient care time was spent during this visit. All questions were encouraged and answered.  F/U in 1 month

## 2019-04-23 NOTE — Progress Notes (Signed)
No egg or soy allergy known to patient  No issues with past sedation with any surgeries  or procedures, no intubation problems  No diet pills per patient No home 02 use per patient  No blood thinners per patient  Pt denies issues with constipation  No A fib or A flutter  EMMI video sent to pt's e mail   Due to the COVID-19 pandemic we are asking patients to follow these guidelines. Please only bring one care partner. Please be aware that your care partner may wait in the car in the parking lot or if they feel like they will be too hot to wait in the car, they may wait in the lobby on the 4th floor. All care partners are required to wear a mask the entire time (we do not have any that we can provide them), they need to practice social distancing, and we will do a Covid check for all patient's and care partners when you arrive. Also we will check their temperature and your temperature. If the care partner waits in their car they need to stay in the parking lot the entire time and we will call them on their cell phone when the patient is ready for discharge so they can bring the car to the front of the building. Also all patient's will need to wear a mask into building.  Pt. Stated covid test already scheduled 04/29/19 12:00

## 2019-04-26 LAB — TOXASSURE SELECT,+ANTIDEPR,UR

## 2019-04-28 ENCOUNTER — Telehealth: Payer: Self-pay | Admitting: *Deleted

## 2019-04-28 NOTE — Telephone Encounter (Signed)
Urine drug screen for this encounter is consistent for prescribed medication 

## 2019-05-02 ENCOUNTER — Encounter: Payer: Medicare Other | Admitting: Gastroenterology

## 2019-05-06 ENCOUNTER — Other Ambulatory Visit: Payer: Self-pay

## 2019-05-06 DIAGNOSIS — M79604 Pain in right leg: Secondary | ICD-10-CM

## 2019-05-08 ENCOUNTER — Other Ambulatory Visit: Payer: Self-pay

## 2019-05-08 ENCOUNTER — Ambulatory Visit (HOSPITAL_COMMUNITY)
Admission: RE | Admit: 2019-05-08 | Discharge: 2019-05-08 | Disposition: A | Discharge status: Home / Self Care | Payer: Medicare Other | Source: Ambulatory Visit | Attending: Vascular Surgery | Admitting: Vascular Surgery

## 2019-05-08 ENCOUNTER — Ambulatory Visit (INDEPENDENT_AMBULATORY_CARE_PROVIDER_SITE_OTHER): Payer: Medicare Other | Admitting: Vascular Surgery

## 2019-05-08 ENCOUNTER — Encounter: Payer: Self-pay | Admitting: Vascular Surgery

## 2019-05-08 VITALS — BP 123/78 | HR 84 | Temp 97.4°F | Resp 16 | Ht 63.0 in | Wt 175.6 lb

## 2019-05-08 DIAGNOSIS — M79604 Pain in right leg: Secondary | ICD-10-CM | POA: Insufficient documentation

## 2019-05-08 DIAGNOSIS — I83811 Varicose veins of right lower extremities with pain: Secondary | ICD-10-CM

## 2019-05-08 DIAGNOSIS — I872 Venous insufficiency (chronic) (peripheral): Secondary | ICD-10-CM | POA: Diagnosis not present

## 2019-05-08 NOTE — Progress Notes (Signed)
Patient name: [name] [name] MRN: [id number] DOB: 10-02-1950 Sex: female  REASON FOR VISIT:   Calf pain and swelling in the right leg.  HPI:   [name] [name] is a pleasant 68 y.o. female who I last saw on 08/14/2018.  She underwent endovenous laser ablation of the left great saphenous vein on 05/09/2018.  Her symptoms improved significantly after her laser ablation procedure.  She presents today with increasing aching pain and heaviness in the right lower extremity.  Her symptoms are aggravated by standing.  Her symptoms are relieved with elevation.  She has been wearing her thigh-high compression stockings and this does help her symptoms some.  She takes ibuprofen as needed for pain.  1 month ago she cut one of the small varicose veins on her medial right ankle and had significant bleeding from this.  She is unaware of any previous history of DVT.  Past Medical History:  Diagnosis Date  . Acute sinusitis, unspecified   . Allergy   . Anemia    "quite a few times"  . Anxiety state, unspecified   . Arachnoiditis BILATERAL LEGS   DUE TO MULTIPLE BACK SURG.'S  . Arthritis   . Asthma    last flare up was 03/2017 lasted over a month  . Blood transfusion   . Cancer ([place])    skin cancers (in scalp)  . Cardiomyopathy HX --06/2010   EF was 25% during acute illness (PHELONEPHRITIS) Repeat echo 12-06-10 60% showed normal EF.   Marland Kitchen Chronic back pain greater than 3 months duration    S/P BACK SURG'S  . CSF leak   . Diabetes mellitus without complication ([place])    dx 2013 type 2  . Dyslipidemia   . Dysphagia    some post op cerv fusion 2/14  . Dysrhythmia   . Essential hypertension, benign   . Family history of adverse reaction to anesthesia    mother gets n/v  . GERD (gastroesophageal reflux disease) AND HIATIAL HERNIA   CONTROLLED W/ NEXIUM  . Headache(784.0)   . Heart murmur    DENIES S & S   (ECHO JUN'12 W/ CHART)  . History of chronic bronchitis   . Hx of bladder infections   .  Hyperlipidemia    TAKE CHLOSTEROL MEDICATION,04/23/19  . Hypertension   . Murmur, heart   . Neuromuscular disorder (HCC)    numbness and tingling  . Osteoporosis   . Other malaise and fatigue   . PONV (postoperative nausea and vomiting)   . Shortness of breath   . Spinal headache   . Spinal stenosis, cervical region   . Varicosities    venous  . Weakness of both legs DUE TO ARACHNOIDITIS   OCCASIONAL USES CANE    Family History  Problem Relation Age of Onset  . Hypertension Mother   . Heart disease Mother   . Diabetes Mother   . Cancer Father   . Diabetes Brother   . Colon cancer Neg Hx   . Colon polyps Neg Hx   . Esophageal cancer Neg Hx   . Rectal cancer Neg Hx   . Stomach cancer Neg Hx     SOCIAL HISTORY: Social History   Tobacco Use  . Smoking status: Never Smoker  . Smokeless tobacco: Never Used  Substance Use Topics  . Alcohol use: No    Allergies  Allergen Reactions  . Latex Itching and Rash  . Morphine And Related Hives and Itching  . Aspirin Nausea And  Vomiting    Can take coated asa  . Biaxin [Clarithromycin] Nausea And Vomiting  . Clarithromycin Diarrhea  . Codeine Itching  . Darvocet [Propoxyphene N-Acetaminophen] Itching  . Hydrocodone-Acetaminophen Itching  . Lortab [Hydrocodone-Acetaminophen] Itching  . Percocet [Oxycodone-Acetaminophen] Itching  . Tramadol Nausea Only    Current Outpatient Medications  Medication Sig Dispense Refill  . alendronate (FOSAMAX) 70 MG tablet Take 70 mg by mouth every Wednesday. Take with a full glass of water on an empty stomach.     . Ascorbic Acid (VITAMIN C) 1000 MG tablet Take 1,000 mg by mouth daily.    Marland Kitchen aspirin EC 81 MG tablet Take 81 mg by mouth at bedtime.     . budesonide-formoterol (SYMBICORT) 80-4.5 MCG/ACT inhaler Inhale 2 puffs into the lungs 2 (two) times daily. 1 Inhaler 6  . Calcium Carbonate-Vit D-Min 1200-1000 MG-UNIT CHEW Chew 1 tablet by mouth daily.     Marland Kitchen diltiazem (CARDIZEM CD) 240 MG  24 hr capsule Take 240 mg by mouth daily.    Marland Kitchen estradiol (ESTRACE) 2 MG tablet Take 2 mg by mouth daily.     Marland Kitchen levalbuterol (XOPENEX HFA) 45 MCG/ACT inhaler Inhale 1-2 puffs into the lungs every 4 (four) hours as needed. For shortness of breath 1 Inhaler 6  . losartan (COZAAR) 25 MG tablet Take 25 mg by mouth daily.    . metFORMIN (GLUCOPHAGE) 500 MG tablet Take 500 mg by mouth 2 (two) times daily with a meal.     . montelukast (SINGULAIR) 10 MG tablet Take 10 mg by mouth at bedtime.    . naloxone (NARCAN) nasal spray 4 mg/0.1 mL Place 1 spray into the nose once as needed (Just in case of Opiod Overdose).    . nortriptyline (PAMELOR) 25 MG capsule Take 1 capsule (25 mg total) by mouth at bedtime. 30 capsule 2  . omeprazole (PRILOSEC) 20 MG capsule Take 20 mg by mouth daily.    . Oxycodone HCl 10 MG TABS Take 1 tablet (10 mg total) by mouth 4 (four) times daily as needed. 120 tablet 0  . Oxymorphone HCl, Crush Resist, 40 MG PO T12A Take 40 mg by mouth 2 (two) times daily. 60 tablet 0  . promethazine (PHENERGAN) 25 MG tablet     . simvastatin (ZOCOR) 10 MG tablet Take 10 mg by mouth daily.    . valACYclovir (VALTREX) 500 MG tablet Take 500 mg by mouth daily.     . vitamin B-12 (CYANOCOBALAMIN) 1000 MCG tablet Take 1,000 mcg by mouth daily.     . diazepam (VALIUM) 10 MG tablet Take 5 mg by mouth See admin instructions.     . gabapentin (NEURONTIN) 800 MG tablet Take 1 tablet (800 mg total) by mouth 4 (four) times daily. (Patient not taking: Reported on 05/08/2019) 120 tablet 5  . Milk Thistle 200 MG CAPS Take 1 capsule by mouth daily.    . Omega-3 Fatty Acids (FISH OIL) 1000 MG CAPS Take 1 capsule by mouth 3 (three) times daily.     No current facility-administered medications for this visit.    Facility-Administered Medications Ordered in Other Visits  Medication Dose Route Frequency Provider Last Rate Last Dose  . levalbuterol (XOPENEX) nebulizer solution 0.63 mg  0.63 mg Nebulization Once  Parrett, Tammy S, NP        REVIEW OF SYSTEMS:  [X]  denotes positive finding, [ ]  denotes negative finding Cardiac  Comments:  Chest pain or chest pressure:    Shortness of  breath upon exertion: x   Short of breath when lying flat:    Irregular heart rhythm:        Vascular    Pain in calf, thigh, or hip brought on by ambulation: x   Pain in feet at night that wakes you up from your sleep:     Blood clot in your veins:    Leg swelling:  x       Pulmonary    Oxygen at home:    Productive cough:     Wheezing:  x       Neurologic    Sudden weakness in arms or legs:     Sudden numbness in arms or legs:     Sudden onset of difficulty speaking or slurred speech:    Temporary loss of vision in one eye:     Problems with dizziness:         Gastrointestinal    Blood in stool:     Vomited blood:         Genitourinary    Burning when urinating:     Blood in urine:        Psychiatric    Major depression:         Hematologic    Bleeding problems:    Problems with blood clotting too easily:        Skin    Rashes or ulcers:        Constitutional    Fever or chills:     PHYSICAL EXAM:   Vitals:   05/08/19 1511  BP: 123/78  Pulse: 84  Resp: 16  Temp: (!) 97.4 F (36.3 C)  TempSrc: Temporal  SpO2: 97%  Weight: 175 lb 9.6 oz (79.7 kg)  Height: 5\' 3"  (1.6 m)    GENERAL: The patient is a well-nourished female, in no acute distress. The vital signs are documented above. CARDIAC: There is a regular rate and rhythm.  VASCULAR: I do not detect carotid bruits. Both feet are warm and well-perfused. She has some spider veins around her ankle on the right foot and also spider veins in the lateral right leg as documented below.  RIGHT LOWER EXTREMITY:     I did look at her right great saphenous vein myself with the SonoSite.  There is significant reflux throughout and the vein is significantly dilated down to the knee.  I would likely cannulate the vein at the level of  the distal thigh or knee.  PULMONARY: There is good air exchange bilaterally without wheezing or rales. ABDOMEN: Soft and non-tender with normal pitched bowel sounds.  MUSCULOSKELETAL: There are no major deformities or cyanosis. NEUROLOGIC: No focal weakness or paresthesias are detected. SKIN: There are no ulcers or rashes noted. PSYCHIATRIC: The patient has a normal affect.  DATA:    VENOUS DUPLEX: I have independently interpreted her venous duplex scan of the right lower extremity.  There is no evidence of DVT.  She does have deep venous reflux involving the popliteal vein.  There is superficial venous reflux involving the right great saphenous vein from the saphenofemoral junction to the proximal calf.  The vein is significantly dilated with diameters up to 0.77 cm.  There is no reflux in the small saphenous vein.  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  She is also having persistent symptoms despite continued compression, elevation, and ibuprofen  as needed for pain.  I think she would be a good candidate for endovenous laser ablation of the right great saphenous vein in order to lower her venous pressure and also lower her risk of further bleeding episodes from the varicosities of her right ankle.  I think she would also benefit from 2 units of sclerotherapy to address her spider veins and telangiectasias.  I have discussed the indications for endovenous laser ablation of the right GSV, that is to lower the pressure in the veins and potentially help relieve the symptoms from venous hypertension. I have also discussed alternative options including conservative treatment with leg elevation, compression therapy, exercise, avoiding prolonged sitting and standing, and weight management. I have discussed the potential complications of the procedure, including, but  not limited to: bleeding, bruising, leg swelling, nerve injury, skin burns, significant pain from phlebitis, deep venous thrombosis, or failure of the vein to close.  I have also explained that venous insufficiency is a chronic disease, and that the patient is at risk for recurrent varicose veins in the future.  All of the patient's questions were encouraged and answered. They are agreeable to proceed.   [name] [name] [facility] [phone]

## 2019-05-12 DIAGNOSIS — E782 Mixed hyperlipidemia: Secondary | ICD-10-CM | POA: Diagnosis not present

## 2019-05-12 DIAGNOSIS — I10 Essential (primary) hypertension: Secondary | ICD-10-CM | POA: Diagnosis not present

## 2019-05-12 DIAGNOSIS — E119 Type 2 diabetes mellitus without complications: Secondary | ICD-10-CM | POA: Diagnosis not present

## 2019-05-13 ENCOUNTER — Other Ambulatory Visit: Payer: Self-pay | Admitting: *Deleted

## 2019-05-13 DIAGNOSIS — I83811 Varicose veins of right lower extremities with pain: Secondary | ICD-10-CM

## 2019-05-22 ENCOUNTER — Encounter: Payer: Medicare Other | Admitting: Registered Nurse

## 2019-05-22 ENCOUNTER — Ambulatory Visit (INDEPENDENT_AMBULATORY_CARE_PROVIDER_SITE_OTHER): Payer: Medicare Other

## 2019-05-22 ENCOUNTER — Other Ambulatory Visit: Payer: Self-pay | Admitting: Registered Nurse

## 2019-05-22 ENCOUNTER — Telehealth: Payer: Self-pay

## 2019-05-22 ENCOUNTER — Other Ambulatory Visit: Payer: Self-pay | Admitting: Gastroenterology

## 2019-05-22 DIAGNOSIS — Z1159 Encounter for screening for other viral diseases: Secondary | ICD-10-CM

## 2019-05-22 DIAGNOSIS — M961 Postlaminectomy syndrome, not elsewhere classified: Secondary | ICD-10-CM

## 2019-05-22 MED ORDER — OXYCODONE HCL 10 MG PO TABS: 10.0000 mg | ORAL_TABLET | Freq: Four times a day (QID) | ORAL | 0 refills | Status: DC | PRN

## 2019-05-22 MED ORDER — OXYMORPHONE HCL ER 40 MG PO T12A: 40.0000 mg | EXTENDED_RELEASE_TABLET | Freq: Two times a day (BID) | ORAL | 0 refills | Status: DC

## 2019-05-22 NOTE — Telephone Encounter (Signed)
PTN WILL SKIP APPT THIS MONTH DUE TO STAFFING - PLEASE REFILL OXY CODONE AND OXY MORPHONE SHE STATES SHE WILL RUN OUT 01.03.21 [facility] APPT IS 01.06.21  [phone] IF ISSUE

## 2019-05-22 NOTE — Telephone Encounter (Signed)
PMP was reviewed: Oxymorphone and Oxycodone: e-scribed. Placed a call to Ms. [place] regarding the above. She verbalizes understanding.

## 2019-05-23 LAB — SARS CORONAVIRUS 2 (TAT 6-24 HRS): SARS Coronavirus 2: NEGATIVE

## 2019-05-26 ENCOUNTER — Telehealth: Payer: Self-pay | Admitting: Gastroenterology

## 2019-05-26 ENCOUNTER — Other Ambulatory Visit: Payer: Self-pay | Admitting: *Deleted

## 2019-05-26 DIAGNOSIS — Z1159 Encounter for screening for other viral diseases: Secondary | ICD-10-CM

## 2019-05-26 NOTE — Telephone Encounter (Signed)
Patient had a colonoscopy scheduled for tomorrow 12/8- she had to cancel her procedure because she had a death in the family today. She is rescheduled for 12/24. She did have her covid test already. Does she need to retake the covid test for her r/s procedure?

## 2019-05-26 NOTE — Telephone Encounter (Signed)
This is her 3rd cancellation for this EGD. OK for no charge this time however given multiple cancellation this will be her last no late cancellation charge. If she does not keep her EGD appt on 12/24 she will need an office evaluation prior to rescheduling EGD.

## 2019-05-26 NOTE — Telephone Encounter (Signed)
Spoke to the patient who requested her 8:30 am appt time be moved to the 10 am slot. This has been done. Order for COVID screen placed in Epic, new appt for another screening made for 12/21 at 12:00 pm.

## 2019-05-26 NOTE — Telephone Encounter (Signed)
Patient had an EGD scheduled for tomorrow 06/22/2023- she had a death in the family today. She is r/s for 12/24. Would you like to charge her?

## 2019-05-27 ENCOUNTER — Encounter: Payer: Medicare Other | Admitting: Gastroenterology

## 2019-05-29 ENCOUNTER — Other Ambulatory Visit: Payer: Self-pay

## 2019-05-29 ENCOUNTER — Encounter: Payer: Self-pay | Admitting: Vascular Surgery

## 2019-05-29 ENCOUNTER — Ambulatory Visit (INDEPENDENT_AMBULATORY_CARE_PROVIDER_SITE_OTHER): Payer: Medicare Other | Admitting: Vascular Surgery

## 2019-05-29 VITALS — BP 153/86 | HR 84 | Temp 97.5°F | Resp 16 | Ht 63.0 in | Wt 175.6 lb

## 2019-05-29 DIAGNOSIS — I83811 Varicose veins of right lower extremities with pain: Secondary | ICD-10-CM

## 2019-05-29 DIAGNOSIS — I872 Venous insufficiency (chronic) (peripheral): Secondary | ICD-10-CM

## 2019-05-29 DIAGNOSIS — I83893 Varicose veins of bilateral lower extremities with other complications: Secondary | ICD-10-CM

## 2019-05-29 HISTORY — PX: ENDOVENOUS ABLATION SAPHENOUS VEIN W/ LASER: SUR449

## 2019-05-29 NOTE — Progress Notes (Signed)
     Laser Ablation Procedure    Date: 05/29/2019   [name] [name] DOB:[birth year]  Consent signed: Yes    Surgeon: Gae Gallop MD   Procedure: Laser Ablation: right Greater Saphenous Vein  BP (!) 153/86 (BP Location: Left Arm, Patient Position: Sitting, Cuff Size: Normal)   Pulse 84   Temp (!) 97.5 F (36.4 C) (Temporal)   Resp 16   Ht 5\' 3"  (1.6 m)   Wt 175 lb 9.6 oz (79.7 kg)   SpO2 100%   BMI 31.11 kg/m   Tumescent Anesthesia: 350 cc 0.9% NaCl with 50 cc Lidocaine HCL 1%  and 15 cc 8.4% NaHCO3  Local Anesthesia: 8 cc Lidocaine HCL and NaHCO3 (ratio 2:1)  15 watts continuous mode    Total Energy: 1986 Joules   Total time: 2:12  Laser Fiber Ref. # [id number]      Lot # [id number]     Patient tolerated procedure well  Notes: Patient wore face mask.  All staff members wore facial masks and facial shields/goggles.    Description of Procedure:  After marking the course of the secondary varicosities, the patient was placed on the operating table in the supine position, and the right leg was prepped and draped in sterile fashion.   Local anesthetic was administered and under ultrasound guidance the saphenous vein was accessed with a micro needle and guide wire; then the mirco puncture sheath was placed.  A guide wire was inserted saphenofemoral junction , followed by a 5 french sheath.  The position of the sheath and then the laser fiber below the junction was confirmed using the ultrasound.  Tumescent anesthesia was administered along the course of the saphenous vein using ultrasound guidance. The patient was placed in Trendelenburg position and protective laser glasses were placed on patient and staff, and the laser was fired at 15 watts continuous mode advancing 1-18mm/second for a total of 1986 joules.       Steri strip was applied to the IV insertion site and ABD pads and thigh high compression stockings were applied.  Ace wrap bandages were applied over the right thigh and  at the top of the saphenofemoral junction. Blood loss was less than 15 cc.  Discharge instructions reviewed with patient and hardcopy of discharge instructions given to patient to take home. The patient ambulated out of the operating room having tolerated the procedure well.

## 2019-05-29 NOTE — Progress Notes (Signed)
Patient name: [name] [name] MRN: [id number] DOB: December 10, 1950 Sex: female  REASON FOR VISIT: For endovenous laser ablation right great saphenous vein  HPI: [name] [name] is a 68 y.o. female who underwent endovenous laser ablation of the left great saphenous vein on 05/09/2018.  Her symptoms in the left leg improved significantly after the procedure.  I last saw her on 05/08/2019.  She was complaining at that time of increasing aching pain and heaviness in the right lower extremity which was aggravated by standing.  Her symptoms were relieved with elevation.  She had been wearing thigh-high compression stockings which helped some.  Of note, in approximately October she had a significant bleeding episode from a small varicosity in her medial right ankle.  At the time of this visit I did look at the right great saphenous vein myself with the SonoSite.  There was significant reflux throughout and the vein was dilated down to the knee.  I felt that I would likely cannulate the vein at the level of the distal thigh or knee.  She presents today for laser ablation of the right great saphenous vein.  Current Outpatient Medications  Medication Sig Dispense Refill  . alendronate (FOSAMAX) 70 MG tablet Take 70 mg by mouth every Wednesday. Take with a full glass of water on an empty stomach.     . Ascorbic Acid (VITAMIN C) 1000 MG tablet Take 1,000 mg by mouth daily.    Marland Kitchen aspirin EC 81 MG tablet Take 81 mg by mouth at bedtime.     . budesonide-formoterol (SYMBICORT) 80-4.5 MCG/ACT inhaler Inhale 2 puffs into the lungs 2 (two) times daily. 1 Inhaler 6  . Calcium Carbonate-Vit D-Min 1200-1000 MG-UNIT CHEW Chew 1 tablet by mouth daily.     . diazepam (VALIUM) 10 MG tablet Take 5 mg by mouth See admin instructions.     Marland Kitchen diltiazem (CARDIZEM CD) 240 MG 24 hr capsule Take 240 mg by mouth daily.    Marland Kitchen estradiol (ESTRACE) 2 MG tablet Take 2 mg by mouth daily.     Marland Kitchen gabapentin (NEURONTIN) 800 MG tablet Take 1 tablet  (800 mg total) by mouth 4 (four) times daily. (Patient not taking: Reported on 05/08/2019) 120 tablet 5  . levalbuterol (XOPENEX HFA) 45 MCG/ACT inhaler Inhale 1-2 puffs into the lungs every 4 (four) hours as needed. For shortness of breath 1 Inhaler 6  . losartan (COZAAR) 25 MG tablet Take 25 mg by mouth daily.    . metFORMIN (GLUCOPHAGE) 500 MG tablet Take 500 mg by mouth 2 (two) times daily with a meal.     . Milk Thistle 200 MG CAPS Take 1 capsule by mouth daily.    . montelukast (SINGULAIR) 10 MG tablet Take 10 mg by mouth at bedtime.    . naloxone (NARCAN) nasal spray 4 mg/0.1 mL Place 1 spray into the nose once as needed (Just in case of Opiod Overdose).    . nortriptyline (PAMELOR) 25 MG capsule TAKE 1 CAPSULE BY MOUTH AT BEDTIME 30 capsule 2  . Omega-3 Fatty Acids (FISH OIL) 1000 MG CAPS Take 1 capsule by mouth 3 (three) times daily.    Marland Kitchen omeprazole (PRILOSEC) 20 MG capsule Take 20 mg by mouth daily.    . Oxycodone HCl 10 MG TABS Take 1 tablet (10 mg total) by mouth 4 (four) times daily as needed. 120 tablet 0  . Oxymorphone HCl, Crush Resist, 40 MG PO T12A Take 40 mg by mouth 2 (two) times  daily. 60 tablet 0  . promethazine (PHENERGAN) 25 MG tablet     . simvastatin (ZOCOR) 10 MG tablet Take 10 mg by mouth daily.    . valACYclovir (VALTREX) 500 MG tablet Take 500 mg by mouth daily.     . vitamin B-12 (CYANOCOBALAMIN) 1000 MCG tablet Take 1,000 mcg by mouth daily.      No current facility-administered medications for this visit.   Facility-Administered Medications Ordered in Other Visits  Medication Dose Route Frequency Provider Last Rate Last Admin  . levalbuterol (XOPENEX) nebulizer solution 0.63 mg  0.63 mg Nebulization Once Parrett, Tammy S, NP        PHYSICAL EXAM: Vitals:   05/29/19 0843  BP: (!) 153/86  Pulse: 84  Resp: 16  Temp: (!) 97.5 F (36.4 C)  TempSrc: Temporal  SpO2: 100%  Weight: 175 lb 9.6 oz (79.7 kg)  Height: 5\' 3"  (1.6 m)    MEDICAL ISSUES:    LASER ABLATION RIGHT GREAT SAPHENOUS VEIN:  The patient was taken to the exam room and placed supine.  I looked at the right great saphenous vein with the SonoSite and elected to cannulate it at the level of the knee.  The right leg was prepped and draped in the usual sterile fashion.  Under ultrasound guidance, after the skin was anesthetized, I cannulated the great saphenous vein at the level of the knee with a micropuncture needle and a micropuncture sheath was introduced over a wire.  I then positioned the J-wire approximately 2-1/2 cm distal to the saphenofemoral junction.  A 35 cm sheath was positioned at the end of the wire and then the wire and dilator removed.  I measured the distance from the saphenofemoral junction to the sheath which was 2.5 cm.  The laser fiber was positioned at the end of the sheath and then the sheath retracted.  I pulled the system back slightly to stay even a little further away from the saphenofemoral junction.  There was a posterior branch distal to the superficial epigastric vein and I felt I could start ablation at that level.  Tumescent anesthesia was then administered circumferentially around the entire vein.  The patient was then placed in Trendelenburg.  Laser ablation was then performed of the great saphenous vein from approximately 2-1/2 cm distal to the saphenofemoral junction to the level of the knee.  85 J of energy were use  Patient tolerated the procedure well.  Sterile dressing was applied.  She will return in 2 weeks for follow-up duplex.  Deitra Mayo [facility] [phone]

## 2019-05-30 NOTE — Telephone Encounter (Signed)
Message from patient

## 2019-06-05 ENCOUNTER — Ambulatory Visit: Payer: Medicare Other | Admitting: Vascular Surgery

## 2019-06-05 ENCOUNTER — Encounter (HOSPITAL_COMMUNITY): Payer: Medicare Other

## 2019-06-11 ENCOUNTER — Ambulatory Visit (HOSPITAL_COMMUNITY)
Admission: RE | Admit: 2019-06-11 | Discharge: 2019-06-11 | Disposition: A | Discharge status: Home / Self Care | Payer: Medicare Other | Source: Ambulatory Visit | Attending: Family | Admitting: Family

## 2019-06-11 ENCOUNTER — Encounter: Payer: Self-pay | Admitting: Vascular Surgery

## 2019-06-11 ENCOUNTER — Ambulatory Visit (INDEPENDENT_AMBULATORY_CARE_PROVIDER_SITE_OTHER): Payer: Medicare Other | Admitting: Vascular Surgery

## 2019-06-11 ENCOUNTER — Other Ambulatory Visit: Payer: Self-pay

## 2019-06-11 VITALS — BP 158/88 | HR 86 | Temp 97.9°F | Resp 20 | Ht 63.0 in | Wt 175.0 lb

## 2019-06-11 DIAGNOSIS — Z48812 Encounter for surgical aftercare following surgery on the circulatory system: Secondary | ICD-10-CM | POA: Diagnosis not present

## 2019-06-11 DIAGNOSIS — I83811 Varicose veins of right lower extremities with pain: Secondary | ICD-10-CM | POA: Diagnosis not present

## 2019-06-11 DIAGNOSIS — I83813 Varicose veins of bilateral lower extremities with pain: Secondary | ICD-10-CM | POA: Diagnosis not present

## 2019-06-11 NOTE — Progress Notes (Signed)
Patient name: [name] [name] MRN: [id number] DOB: Aug 13, 1950 Sex: female  REASON FOR VISIT:   Follow-up after laser ablation of the right great saphenous vein  HPI:   [name] [name] is a pleasant 68 y.o. female who underwent laser ablation of the right great saphenous vein on 05/29/2019.  She comes in for 2-week follow-up visit.  Of note she underwent previous laser ablation of the left great saphenous vein in November 2019.  She tells me that she had more pain after this procedure than on the left side.  I reviewed her note we used 1986 J from 2-1/2 cm distal to the saphenofemoral junction to the knee which would be an appropriate amount of energy.  She is otherwise doing well.  She has been taking less ibuprofen and at this point is almost stopped taking it.  She has been wearing her thigh-high compression stockings.  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  She denies any history of stroke, TIAs, expressive or receptive aphasia.  She does describe one episode of some visual disturbance in both eyes.  She also describes some occasional dizziness.  She seems somewhat concerned that she might have carotid disease.  Current Outpatient Medications  Medication Sig Dispense Refill  . alendronate (FOSAMAX) 70 MG tablet Take 70 mg by mouth every Wednesday. Take with a full glass of water on an empty stomach.     . Ascorbic Acid (VITAMIN C) 1000 MG tablet Take 1,000 mg by mouth daily.    Marland Kitchen aspirin EC 81 MG tablet Take 81 mg by mouth at bedtime.     . budesonide-formoterol (SYMBICORT) 80-4.5 MCG/ACT inhaler Inhale 2 puffs into the lungs 2 (two) times daily. 1 Inhaler 6  . Calcium Carbonate-Vit D-Min 1200-1000 MG-UNIT CHEW Chew 1 tablet by mouth daily.     . diazepam (VALIUM) 10 MG tablet Take 5 mg by mouth See admin instructions.     Marland Kitchen diltiazem (CARDIZEM CD) 240 MG 24 hr capsule Take 240 mg by mouth daily.    Marland Kitchen estradiol  (ESTRACE) 2 MG tablet Take 2 mg by mouth daily.     Marland Kitchen gabapentin (NEURONTIN) 800 MG tablet Take 1 tablet (800 mg total) by mouth 4 (four) times daily. 120 tablet 5  . levalbuterol (XOPENEX HFA) 45 MCG/ACT inhaler Inhale 1-2 puffs into the lungs every 4 (four) hours as needed. For shortness of breath 1 Inhaler 6  . losartan (COZAAR) 25 MG tablet Take 25 mg by mouth daily.    Marland Kitchen losartan-hydrochlorothiazide (HYZAAR) 50-12.5 MG tablet Take 1 tablet by mouth daily.    . metFORMIN (GLUCOPHAGE) 500 MG tablet Take 500 mg by mouth 2 (two) times daily with a meal.     . Milk Thistle 200 MG CAPS Take 1 capsule by mouth daily.    . montelukast (SINGULAIR) 10 MG tablet Take 10 mg by mouth at bedtime.    . naloxone (NARCAN) nasal spray 4 mg/0.1 mL Place 1 spray into the nose once as needed (Just in case of Opiod Overdose).    . nortriptyline (PAMELOR) 25 MG capsule TAKE 1 CAPSULE BY MOUTH AT BEDTIME 30 capsule 2  . Omega-3 Fatty Acids (FISH OIL) 1000 MG CAPS Take 1 capsule by mouth 3 (three) times daily.    Marland Kitchen omeprazole (PRILOSEC) 20 MG capsule Take 20 mg by mouth daily.    . Oxycodone HCl 10 MG  TABS Take 1 tablet (10 mg total) by mouth 4 (four) times daily as needed. 120 tablet 0  . Oxymorphone HCl, Crush Resist, 40 MG PO T12A Take 40 mg by mouth 2 (two) times daily. 60 tablet 0  . promethazine (PHENERGAN) 25 MG tablet     . simvastatin (ZOCOR) 10 MG tablet Take 10 mg by mouth daily.    . valACYclovir (VALTREX) 500 MG tablet Take 500 mg by mouth daily.     . vitamin B-12 (CYANOCOBALAMIN) 1000 MCG tablet Take 1,000 mcg by mouth daily.      No current facility-administered medications for this visit.   Facility-Administered Medications Ordered in Other Visits  Medication Dose Route Frequency Provider Last Rate Last Admin  . levalbuterol (XOPENEX) nebulizer solution 0.63 mg  0.63 mg Nebulization Once Parrett, Tammy S, NP        REVIEW OF SYSTEMS:  [X]  denotes positive finding, [ ]  denotes negative  finding Vascular    Leg swelling    Cardiac    Chest pain or chest pressure:    Shortness of breath upon exertion:    Short of breath when lying flat:    Irregular heart rhythm:    Constitutional    Fever or chills:     PHYSICAL EXAM:   Vitals:   06/11/19 1144  BP: (!) 158/88  Pulse: 86  Resp: 20  Temp: 97.9 F (36.6 C)  SpO2: 96%  Weight: 175 lb (79.4 kg)  Height: 5\' 3"  (1.6 m)    GENERAL: The patient is a well-nourished female, in no acute distress. The vital signs are documented above. CARDIOVASCULAR: There is a regular rate and rhythm. PULMONARY: There is good air exchange bilaterally without wheezing or rales. She has minimal bruising in the medial right thigh. VASCULAR: I do not detect carotid bruits.  DATA:   VENOUS DUPLEX: I have independently interpreted her venous duplex scan today.  There is no evidence of DVT.  The great saphenous vein is successfully closed from 2.1 cm distal to the saphenofemoral junction to the level of the knee.  MEDICAL ISSUES:   STATUS POST LASER ABLATION RIGHT GREAT SAPHENOUS VEIN: Patient is doing well status post laser ablation of the right great saphenous vein.  She has no specific complaints at this point.  I have encouraged her to continue to elevate her legs and I will plan on seeing her back in 1 year for a follow-up office visit.  I will think she needs formal test at that time.  I can look at her great saphenous veins myself with the SonoSite at that time.  DIZZINESS: The patient has been having some occasional problems with dizziness.  She also had one episode of a visual disturbance in both eyes.  She is very concerned about her carotids and therefore I have ordered a carotid duplex scan.  She did not want to stay to have this done today so we will order this in the near future and I can call her with those results.  Deitra Mayo [facility] [place] [phone]

## 2019-06-12 ENCOUNTER — Encounter: Payer: Medicare Other | Admitting: Gastroenterology

## 2019-06-16 ENCOUNTER — Other Ambulatory Visit: Payer: Self-pay

## 2019-06-16 DIAGNOSIS — M961 Postlaminectomy syndrome, not elsewhere classified: Secondary | ICD-10-CM

## 2019-06-16 MED ORDER — OXYMORPHONE HCL ER 40 MG PO T12A: 40.0000 mg | EXTENDED_RELEASE_TABLET | Freq: Two times a day (BID) | ORAL | 0 refills | Status: DC

## 2019-06-16 MED ORDER — OXYCODONE HCL 10 MG PO TABS: 10.0000 mg | ORAL_TABLET | Freq: Four times a day (QID) | ORAL | 0 refills | Status: DC | PRN

## 2019-06-16 NOTE — Telephone Encounter (Signed)
PMP was reviewed: Oxymorphone and Oxycodone e-scribed today to accommodate [name] [name] scheduled appointment.

## 2019-06-18 ENCOUNTER — Encounter: Payer: Self-pay | Admitting: Vascular Surgery

## 2019-06-19 ENCOUNTER — Encounter: Payer: Self-pay | Admitting: Vascular Surgery

## 2019-06-25 ENCOUNTER — Encounter: Payer: Medicare Other | Attending: Physical Medicine & Rehabilitation | Admitting: Registered Nurse

## 2019-06-25 ENCOUNTER — Telehealth: Payer: Self-pay | Admitting: *Deleted

## 2019-06-25 ENCOUNTER — Encounter: Payer: Self-pay | Admitting: Registered Nurse

## 2019-06-25 ENCOUNTER — Other Ambulatory Visit: Payer: Self-pay

## 2019-06-25 ENCOUNTER — Ambulatory Visit: Payer: Medicare Other | Admitting: Physical Medicine & Rehabilitation

## 2019-06-25 VITALS — Ht 64.0 in | Wt 170.0 lb

## 2019-06-25 DIAGNOSIS — M5412 Radiculopathy, cervical region: Secondary | ICD-10-CM | POA: Diagnosis not present

## 2019-06-25 DIAGNOSIS — M961 Postlaminectomy syndrome, not elsewhere classified: Secondary | ICD-10-CM | POA: Insufficient documentation

## 2019-06-25 DIAGNOSIS — Z5181 Encounter for therapeutic drug level monitoring: Secondary | ICD-10-CM | POA: Diagnosis not present

## 2019-06-25 DIAGNOSIS — G039 Meningitis, unspecified: Secondary | ICD-10-CM | POA: Diagnosis not present

## 2019-06-25 DIAGNOSIS — M5416 Radiculopathy, lumbar region: Secondary | ICD-10-CM | POA: Diagnosis not present

## 2019-06-25 DIAGNOSIS — M62838 Other muscle spasm: Secondary | ICD-10-CM | POA: Diagnosis not present

## 2019-06-25 DIAGNOSIS — M5441 Lumbago with sciatica, right side: Secondary | ICD-10-CM | POA: Insufficient documentation

## 2019-06-25 DIAGNOSIS — Z79891 Long term (current) use of opiate analgesic: Secondary | ICD-10-CM

## 2019-06-25 DIAGNOSIS — M542 Cervicalgia: Secondary | ICD-10-CM | POA: Diagnosis not present

## 2019-06-25 DIAGNOSIS — G894 Chronic pain syndrome: Secondary | ICD-10-CM | POA: Insufficient documentation

## 2019-06-25 DIAGNOSIS — Z79899 Other long term (current) drug therapy: Secondary | ICD-10-CM | POA: Insufficient documentation

## 2019-06-25 MED ORDER — OXYMORPHONE HCL ER 40 MG PO T12A: 40.0000 mg | EXTENDED_RELEASE_TABLET | Freq: Two times a day (BID) | ORAL | 0 refills | Status: DC

## 2019-06-25 MED ORDER — OXYCODONE HCL 10 MG PO TABS: 10.0000 mg | ORAL_TABLET | Freq: Four times a day (QID) | ORAL | 0 refills | Status: DC | PRN

## 2019-06-25 NOTE — Telephone Encounter (Addendum)
Prior authorization submitted via covermymeds to [employer] for oxymorphone.  Approved from 06/20/2019-06/24/2020

## 2019-06-25 NOTE — Progress Notes (Signed)
Subjective:    Patient ID: [name] [name], female    DOB: [birth year], 69 y.o.   MRN: [id number]  HPI: [name] [name] is a 69 y.o. female whose appointment was changed to a virtual office visit to reduce the risk of exposure to the COVID-19 virus and to help Ms. Perkin remain healthy and safe. The virtual visit will also provide continuity of care. Ms. Meriweather agrees with virtual visit and verbalizes understanding.  She states her pain is located in her neck radiating into her bilateral shoulders, lower back pain radiating into her lower extremities. She rates her pain 7. Her current exercise regime is walking and performing stretching exercises.  Ms. Kitch Morphine equivalent is 300.00  MME.  Last UDS was performed on 04/23/2019, it was consistent  Wenda Overland RN asked the Health and History Questions. This provider and Wenda Overland verified we were speaking with the correct person using two identifiers.   Pain Inventory Average Pain 6 Pain Right Now 7 My pain is constant, sharp, burning, stabbing, tingling and pressure  In the last 24 hours, has pain interfered with the following? General activity 7 Relation with others 7 Enjoyment of life 7 What TIME of day is your pain at its worst? all Sleep (in general) Fair  Pain is worse with: bending, sitting, standing and some activites Pain improves with: heat/ice, medication and walking Relief from Meds: 6  Mobility walk without assistance use a walker ability to climb steps?  yes do you drive?  yes  Function I need assistance with the following:  meal prep, household duties and shopping  Neuro/Psych weakness numbness tingling trouble walking dizziness anxiety  Prior Studies Any changes since last visit?  no  Physicians involved in your care Any changes since last visit?  no   Family History  Problem Relation Age of Onset  . Hypertension Mother   . Heart disease Mother   . Diabetes Mother   . Cancer Father   .  Diabetes Brother   . Colon cancer Neg Hx   . Colon polyps Neg Hx   . Esophageal cancer Neg Hx   . Rectal cancer Neg Hx   . Stomach cancer Neg Hx    Social History   Socioeconomic History  . Marital status: Divorced    Spouse name: Not on file  . Number of children: Not on file  . Years of education: Not on file  . Highest education level: Not on file  Occupational History  . Occupation: disable    Employer: DISABLED  Tobacco Use  . Smoking status: Never Smoker  . Smokeless tobacco: Never Used  Substance and Sexual Activity  . Alcohol use: No  . Drug use: No  . Sexual activity: Not on file    Comment: second hand smoke  Other Topics Concern  . Not on file  Social History Narrative   Has one child   Disabled   Social Determinants of Health   Financial Resource Strain:   . Difficulty of Paying Living Expenses: Not on file  Food Insecurity:   . Worried About Charity fundraiser in the Last Year: Not on file  . Ran Out of Food in the Last Year: Not on file  Transportation Needs:   . Lack of Transportation (Medical): Not on file  . Lack of Transportation (Non-Medical): Not on file  Physical Activity:   . Days of Exercise per Week: Not on file  . Minutes of Exercise per Session: Not  on file  Stress:   . Feeling of Stress : Not on file  Social Connections:   . Frequency of Communication with Friends and Family: Not on file  . Frequency of Social Gatherings with Friends and Family: Not on file  . Attends Religious Services: Not on file  . Active Member of Clubs or Organizations: Not on file  . Attends Archivist Meetings: Not on file  . Marital Status: Not on file   Past Surgical History:  Procedure Laterality Date  . ABDOMINAL HYSTERECTOMY  1987   W/ BSO  . ANTERIOR CERVICAL DECOMP/DISCECTOMY FUSION N/A 08/09/2012   Procedure: ANTERIOR CERVICAL DECOMPRESSION/DISCECTOMY FUSION 1 LEVEL;  Surgeon: Floyce Stakes, MD;  Location: [facility];  Service:  Neurosurgery;  Laterality: N/A;  Cervical four-five  Anterior cervical decompression/diskectomy/fusion  . ANTERIOR FUSION CERVICAL SPINE  2007   C5 -6  . APPENDECTOMY    . APPLICATION OF INTRAOPERATIVE CT SCAN N/A 05/17/2017   Procedure: APPLICATION OF INTRAOPERATIVE CT SCAN;  Surgeon: Erline Levine, MD;  Location: [place];  Service: Neurosurgery;  Laterality: N/A;  . BACK SURGERY     x 5  . BREAST EXCISIONAL BIOPSY Bilateral    No scar seen   . BREAST SURGERY     x 2 biopsies  . CARPAL TUNNEL RELEASE  RIGHT - 2001  & DEC 2011 W/ BACK SURG.  . CERVICAL DISC SURGERY  2005   C5 - 6  . CHOLECYSTECTOMY  1994  . DORSAL COMPARTMENT RELEASE Right 04/07/2013   Procedure: RIGHT WRIST STS RELEASE;  Surgeon: Schuyler Amor, MD;  Location: [place];  Service: Orthopedics;  Laterality: Right;  . ENDOVENOUS ABLATION SAPHENOUS VEIN W/ LASER Right 05/29/2019   endovenous laser ablation right greater saphenous vein by Gae Gallop MD   . [place] CSF LEAK  [place] 2011  X2   POST [place]  . FINGER ARTHRODESIS Right 04/07/2013   Procedure: RIGHT INDEX AND RIGHT LONG DISTAL INTERPHALANGEAL JOINT FUSIONS;  Surgeon: Schuyler Amor, MD;  Location: [place];  Service: Orthopedics;  Laterality: Right;  . FINGER ARTHROPLASTY Right 04/07/2013   Procedure: RIGHT THUMB [place] ARTHROPLASTY;  Surgeon: Schuyler Amor, MD;  Location: [place];  Service: Orthopedics;  Laterality: Right;  . GANGLION CYST EXCISION Right 04/07/2013   Procedure: RIGHT WRIST MASS EXCISION;  Surgeon: Schuyler Amor, MD;  Location: [place];  Service: Orthopedics;  Laterality: Right;  . KNEE ARTHROSCOPY  LEFT X3 (LAST ONE 2005)  . KNEE ARTHROSCOPY  05/17/2011   Procedure: ARTHROSCOPY KNEE;  Surgeon: Bradley Ferris III;  Location: [facility] [place];  Service: Orthopedics;  Laterality: Right;  WITH MEDIAL MENISECTOMY AND removal of  suprapatella fat lump  . LEFT WRIST TENOSYNECTOMY W/ LEFT THUMB JOINT REPAIR  02-24-10  . LUMBAR FUSION  11-30-10   L4 - 5  . LUMBAR FUSION  2000   L2 - 4  . LUMBAR LAMINECTOMY  DEC 2011   L4 - 5  . POSTERIOR CERVICAL FUSION/FORAMINOTOMY N/A 05/17/2017   Procedure: Cervical five  to Thorastic one  Posterior cervical fusion with lateral mass screws/revision of prior instrumentation;  Surgeon: Erline Levine, MD;  Location: [place];  Service: Neurosurgery;  Laterality: N/A;  C5 to T1 Posterior cervical fusion with lateral mass screws/revision of prior instrumentation  . TENDON REPAIR  JAN 2010   LEFT INDEX AND LONG FINGERS  . THUMB SURGERY  04/07/2019   RIGHT THUMB  . TUBAL LIGATION     Past Medical History:  Diagnosis Date  . Acute sinusitis, unspecified   . Allergy   . Anemia    "quite a few times"  . Anxiety state, unspecified   . Arachnoiditis BILATERAL LEGS   DUE TO MULTIPLE BACK SURG.'S  . Arthritis   . Asthma    last flare up was 03/2017 lasted over a month  . Blood transfusion   . Cancer ([place])    skin cancers (in scalp)  . Cardiomyopathy HX --06/2010   EF was 25% during acute illness (PHELONEPHRITIS) Repeat echo 12-06-10 60% showed normal EF.   Marland Kitchen Chronic back pain greater than 3 months duration    S/P BACK SURG'S  . CSF leak   . Diabetes mellitus without complication ([place])    dx 2013 type 2  . Dyslipidemia   . Dysphagia    some post op cerv fusion 2/14  . Dysrhythmia   . Essential hypertension, benign   . Family history of adverse reaction to anesthesia    mother gets n/v  . GERD (gastroesophageal reflux disease) AND HIATIAL HERNIA   CONTROLLED W/ NEXIUM  . Headache(784.0)   . Heart murmur    DENIES S & S   (ECHO JUN'12 W/ CHART)  . History of chronic bronchitis   . Hx of bladder infections   . Hyperlipidemia    TAKE CHLOSTEROL MEDICATION,04/23/19  . Hypertension   . Murmur, heart   . Neuromuscular disorder (HCC)    numbness and tingling  . Osteoporosis     . Other malaise and fatigue   . PONV (postoperative nausea and vomiting)   . Shortness of breath   . Spinal headache   . Spinal stenosis, cervical region   . Varicosities    venous  . Weakness of both legs DUE TO ARACHNOIDITIS   OCCASIONAL USES CANE   Ht 5\' 4"  (1.626 m) Comment: pt reported  Wt 170 lb (77.1 kg) Comment: pt reported  BMI 29.18 kg/m   Opioid Risk Score:   Fall Risk Score:  `1  Depression screen PHQ 2/9  Depression screen [facility] 2/9 06/25/2019 10/30/2018 08/14/2018 03/12/2018 10/25/2017 09/27/2017 07/02/2017  Decreased Interest 0 0 0 0 0 0 1  Down, Depressed, Hopeless 0 0 - 0 0 0 1  PHQ - 2 Score 0 0 0 0 0 0 2  Altered sleeping - - - - - 0 -  Tired, decreased energy - - - - - 0 -  Change in appetite - - - - - 0 -  Feeling bad or failure about yourself  - - - - - 0 -  Trouble concentrating - - - - - 0 -  Moving slowly or fidgety/restless - - - - - 0 -  Suicidal thoughts - - - - - 0 -  PHQ-9 Score - - - - - 0 -  Some recent data might be hidden   Review of Systems  Constitutional: Negative.   HENT: Negative.   Eyes: Negative.   Respiratory: Negative.   Cardiovascular: Negative.   Gastrointestinal: Negative.   Endocrine: Negative.   Genitourinary: Negative.   Musculoskeletal: Positive for gait problem.  Skin: Negative.   Allergic/Immunologic: Negative.   Neurological: Positive for dizziness, weakness and numbness.       Tingling  Hematological: Negative.   Psychiatric/Behavioral: The patient is nervous/anxious.   All other systems reviewed and are negative.      Objective:  Physical Exam Vitals and nursing note reviewed.  Musculoskeletal:     Comments: No Physical Exam : Virtual Visit           Assessment & Plan:  1. On 05/17/2017 :C5C6C7 T1 Posterior Cervical Fusion with lateral mass fixation with AIRO Imaging, revision of prior instrumentation. By Dr. Vertell Limber.  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  Continuecurrent medication regimen withGabapentin800 mgBID. Refilled:Oxycodone 10mg  one tablet every 6 hours as needed #120tabletsand Oxymorphone HCL 40 mg every 12 hours #60. 06/25/2019 We will continue the opioid monitoring program, this consists of regular clinic visits, examinations, urine drug screen, pill counts as well as use of [place] Controlled Substance Reporting System. 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 06/25/2019 3. Anxiety/depression: Continue Valium, PCP Following. Continueto monitor.06/25/2019 4. Muscle Spasms: Continuecurrent medication regime withFlexeril. Continue to Monitor.06/25/2019 5. Cervicalgia/ Cervical Radiculitis: Continuecurrent medication regime withGabapentin: Dr. Vertell Limber Following: S/P C5C6C7 T1 Posterior Cervical Fusion with lateral mass fixation with AIRO Imaging, revision of prior instrumentation by Dr. Vertell Limber on 05/17/2017. 06/25/2019 7. Bilateral Thoracic Back Pain:Continue HEP as Tolerated and Continue current medication regimen. Continue to Monitor.06/25/2019 8. Left lower extremity DVT/ Phlebitis: S/P endovenous laser ablation of left great saphenous vein on 05/09/2018 by Dr. Scot Dock: Vascular Following. 06/25/2019. 9. Insomnia:ContinuePamelor. Continue to Monitor.06/25/2019.   F/U in 1 month  Tele-Health Visit Telephone Call Established Patient Location of Patient: In her Home Location of Provider: In Office Time Spent: 15 Minutes

## 2019-06-26 ENCOUNTER — Other Ambulatory Visit: Payer: Self-pay | Admitting: *Deleted

## 2019-06-26 DIAGNOSIS — R42 Dizziness and giddiness: Secondary | ICD-10-CM

## 2019-06-30 ENCOUNTER — Telehealth: Payer: Self-pay | Admitting: Gastroenterology

## 2019-06-30 ENCOUNTER — Telehealth: Payer: Self-pay | Admitting: Internal Medicine

## 2019-06-30 NOTE — Telephone Encounter (Signed)
Attempted to call pt but line went straight to VM. Left message for pt to return call. 

## 2019-06-30 NOTE — Telephone Encounter (Signed)
PT was exposed to COVD over the weekend and is now having symptoms. She is scheduled for EGD on Thursday. Shes getting tested today for COVID per her PCP. Will cancel her procedure for Thursday and she will reschedule when she's feeling better.

## 2019-06-30 NOTE — Telephone Encounter (Signed)
She had a virtual visit in May 2020 and EGD has been cancelled, rescheduled several times. She needs an in person office visit prior to rescheduling EGD.

## 2019-07-01 ENCOUNTER — Other Ambulatory Visit: Payer: Self-pay

## 2019-07-01 ENCOUNTER — Ambulatory Visit (INDEPENDENT_AMBULATORY_CARE_PROVIDER_SITE_OTHER): Payer: Medicare Other | Admitting: Adult Health

## 2019-07-01 ENCOUNTER — Encounter: Payer: Self-pay | Admitting: Adult Health

## 2019-07-01 DIAGNOSIS — R05 Cough: Secondary | ICD-10-CM | POA: Diagnosis not present

## 2019-07-01 DIAGNOSIS — R059 Cough, unspecified: Secondary | ICD-10-CM

## 2019-07-01 DIAGNOSIS — J4541 Moderate persistent asthma with (acute) exacerbation: Secondary | ICD-10-CM

## 2019-07-01 DIAGNOSIS — R053 Chronic cough: Secondary | ICD-10-CM

## 2019-07-01 MED ORDER — PREDNISONE 10 MG PO TABS: ORAL_TABLET | ORAL | 0 refills | Status: DC

## 2019-07-01 MED ORDER — BUDESONIDE-FORMOTEROL FUMARATE 160-4.5 MCG/ACT IN AERO: 2.0000 | INHALATION_SPRAY | Freq: Two times a day (BID) | RESPIRATORY_TRACT | 5 refills | Status: AC

## 2019-07-01 MED ORDER — AMOXICILLIN-POT CLAVULANATE 875-125 MG PO TABS: 1.0000 | ORAL_TABLET | Freq: Two times a day (BID) | ORAL | 0 refills | Status: AC

## 2019-07-01 NOTE — Progress Notes (Signed)
Virtual Visit via Telephone Note  I connected with [name] [name] on 07/01/19 at 10:45 AM EST by telephone and verified that I am speaking with the correct person using two identifiers.  Location: Patient: Home  Provider: Office    I discussed the limitations, risks, security and privacy concerns of performing an evaluation and management service by telephone and the availability of in person appointments. I also discussed with the patient that there may be a patient responsible charge related to this service. The patient expressed understanding and agreed to proceed.   History of Present Illness: 69 yo female never smoker followed for Asthma   Today's televisit is an acute office visit.  Patient complains over the last week that she has had increased cough that is deep at times difficult to get up any substantial mucus.  She has hoarseness , headache.  She denies any fever or loss of taste or smell.  Patient says she was with her best friend on January 3 and shortly after this her best friend tested positive for Covid.  Patient developed her initial symptoms of headache on January 5.  She says prior to this she did have a upper respiratory infection and was treated by her primary care physician.  She says she was given Cipro.  She says she did not have any substantial improvement in symptoms. She denies any hemoptysis chest pain orthopnea PND or leg swelling.  She is on Symbicort.  She says she has had low appetite.  Has had some intermittent nausea. She has been using her Xopenex inhaler and nebulizer more often.   Patient Active Problem List   Diagnosis Date Noted  . Upper respiratory tract infection 06/17/2018  . 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 Aortic atherosclerosis ([place]) 04/25/2018  . Cervical stenosis of spinal canal 05/17/2017  . 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 At risk from passive smoking 04/18/2016  . Moderate persistent asthma with acute exacerbation 04/18/2016  . Adhesive  arachnoiditis 10/22/2013  . Chronic cough 11/06/2012  . Lung nodule 09/24/2012  . Mild persistent asthma, uncomplicated [id number]  . Lumbar post-laminectomy syndrome 03/22/2012  . Depression with anxiety 09/29/2011  . Cervical post-laminectomy syndrome 08/28/2011  . Arachnoiditis 08/28/2011  . Cervical radicular pain 08/28/2011  . Anxiety 08/28/2011  . Headache(784.0)   . Dyspnea 12/23/2010  . Hypertension    . Current Outpatient Medications on File Prior to Visit  Medication Sig Dispense Refill  . alendronate (FOSAMAX) 70 MG tablet Take 70 mg by mouth every Wednesday. Take with a full glass of water on an empty stomach.     . Ascorbic Acid (VITAMIN C) 1000 MG tablet Take 1,000 mg by mouth daily.    Marland Kitchen aspirin EC 81 MG tablet Take 81 mg by mouth at bedtime.     . budesonide-formoterol (SYMBICORT) 80-4.5 MCG/ACT inhaler Inhale 2 puffs into the lungs 2 (two) times daily. 1 Inhaler 6  . Calcium Carbonate-Vit D-Min 1200-1000 MG-UNIT CHEW Chew 1 tablet by mouth daily.     Marland Kitchen diltiazem (CARDIZEM CD) 240 MG 24 hr capsule Take 240 mg by mouth daily.    Marland Kitchen estradiol (ESTRACE) 2 MG tablet Take 2 mg by mouth daily.     Marland Kitchen gabapentin (NEURONTIN) 800 MG tablet Take 1 tablet (800 mg total) by mouth 4 (four) times daily. 120 tablet 5  . levalbuterol (XOPENEX HFA) 45 MCG/ACT inhaler Inhale 1-2 puffs into the lungs every 4 (four) hours as needed. For shortness of breath 1 Inhaler 6  . losartan-hydrochlorothiazide (  HYZAAR) 50-12.5 MG tablet Take 1 tablet by mouth daily.    . metFORMIN (GLUCOPHAGE) 500 MG tablet Take 500 mg by mouth 2 (two) times daily with a meal.     . Milk Thistle 200 MG CAPS Take 1 capsule by mouth daily.    . montelukast (SINGULAIR) 10 MG tablet Take 10 mg by mouth at bedtime.    . naloxone (NARCAN) nasal spray 4 mg/0.1 mL Place 1 spray into the nose once as needed (Just in case of Opiod Overdose).    . nortriptyline (PAMELOR) 25 MG capsule TAKE 1 CAPSULE BY MOUTH AT BEDTIME 30 capsule  2  . Omega-3 Fatty Acids (FISH OIL) 1000 MG CAPS Take 1 capsule by mouth 3 (three) times daily.    Marland Kitchen omeprazole (PRILOSEC) 20 MG capsule Take 20 mg by mouth daily.    . Oxycodone HCl 10 MG TABS Take 1 tablet (10 mg total) by mouth 4 (four) times daily as needed. Do Not Fill Before 07/19/2019 120 tablet 0  . Oxymorphone HCl, Crush Resist, 40 MG PO T12A Take 40 mg by mouth 2 (two) times daily. Do Not Fill Before 07/19/2019 60 tablet 0  . promethazine (PHENERGAN) 25 MG tablet     . simvastatin (ZOCOR) 10 MG tablet Take 10 mg by mouth daily.    . valACYclovir (VALTREX) 500 MG tablet Take 500 mg by mouth daily.     . diazepam (VALIUM) 10 MG tablet Take 5 mg by mouth See admin instructions.      Current Facility-Administered Medications on File Prior to Visit  Medication Dose Route Frequency Provider Last Rate Last Admin  . levalbuterol (XOPENEX) nebulizer solution 0.63 mg  0.63 mg Nebulization Once Johnye Kist S, NP          Observations/Objective:  Methacholine challenge test positiveandtracheobronchomalacia on CT OCt 2019 [place] 25 ppb 20108 CXR 06/14/18 - No active cardiopulmonary disease. HRCT 04/11/18 - No findings to suggest interstitial lung disease. Moderate air trapping indicative of small airways disease. 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 Innumerable tiny 1-2 mm pulmonary nodules scattered throughout the lungs bilaterally, stable dating back to 2017, considered definitively benign.  Assessment and Plan: Acute recurrent asthmatic bronchitis.-Patient has a known Covid positive exposure. Set up for COVID-19 testing.  Advised to self isolate/quarantine until test results are available. Advised on red flags to watch out for fever increase shortness of breath hypoxia. We will treat with antibiotics and steroid course.  Increase Symbicort to 160 twice daily.  Hold Fosamax and fish oil for 1 month  Plan  Patient Instructions  COVID 19 test  Augmentin 875mg  Twice daily  For 1  week , take with foot.  Eat yogurt daily .  Prednisone taper over next week.  Increase Symbicort 160 2 puffs Twice daily  Mucinex Twice daily  As needed  Cough /congestion  Delsym 2 tsp Twice daily  As needed  Cough .  Hold fish oil and fosamax for 1 month.  Sips of water to soothe throat .  No MINTS .  Saline nasal rinses Twice daily   Follow up with Dr. Chase Caller in 2-3 weeks with  and As needed   Please contact office for sooner follow up if symptoms do not improve or worsen or seek emergency care       Follow Up Instructions:   Follow-up in 2 to 3 weeks and as needed will need chest x-ray on return   Please contact office for sooner follow up if symptoms do not improve or  worsen or seek emergency care    I discussed the assessment and treatment plan with the patient. The patient was provided an opportunity to ask questions and all were answered. The patient agreed with the plan and demonstrated an understanding of the instructions.   The patient was advised to call back or seek an in-person evaluation if the symptoms worsen or if the condition fails to improve as anticipated.  I provided 25  minutes of non-face-to-face time during this encounter.   Rexene Edison, NP

## 2019-07-01 NOTE — Patient Instructions (Addendum)
COVID 19 test  Augmentin 875mg  Twice daily  For 1 week , take with foot.  Eat yogurt daily .  Prednisone taper over next week.  Increase Symbicort 160 2 puffs Twice daily  Mucinex Twice daily  As needed  Cough /congestion  Delsym 2 tsp Twice daily  As needed  Cough .  Hold fish oil and fosamax for 1 month.  Sips of water to soothe throat .  No MINTS .  Saline nasal rinses Twice daily   Follow up with Dr. Chase Caller in 2-3 weeks with  and As needed   Please contact office for sooner follow up if symptoms do not improve or worsen or seek emergency care

## 2019-07-01 NOTE — Telephone Encounter (Signed)
Spoke with the pt  She states started having HA and congestion off and on starting Jan 5th She was around someone with covid and hugged her on Jan 3rd  She states having some SOB also  Televisit with TP today was scheduled  She states her phone will not do video visits for some reason

## 2019-07-02 DIAGNOSIS — E782 Mixed hyperlipidemia: Secondary | ICD-10-CM | POA: Diagnosis not present

## 2019-07-02 DIAGNOSIS — E119 Type 2 diabetes mellitus without complications: Secondary | ICD-10-CM | POA: Diagnosis not present

## 2019-07-02 DIAGNOSIS — I10 Essential (primary) hypertension: Secondary | ICD-10-CM | POA: Diagnosis not present

## 2019-07-03 ENCOUNTER — Encounter: Payer: Medicare Other | Admitting: Gastroenterology

## 2019-07-03 ENCOUNTER — Ambulatory Visit: Payer: Medicare Other | Attending: Internal Medicine

## 2019-07-03 DIAGNOSIS — Z20822 Contact with and (suspected) exposure to covid-19: Secondary | ICD-10-CM

## 2019-07-04 LAB — NOVEL CORONAVIRUS, NAA: SARS-CoV-2, NAA: NOT DETECTED

## 2019-07-21 ENCOUNTER — Ambulatory Visit: Payer: Medicare Other | Admitting: Adult Health

## 2019-07-21 ENCOUNTER — Encounter (HOSPITAL_COMMUNITY): Payer: Medicare Other

## 2019-07-23 ENCOUNTER — Encounter (HOSPITAL_COMMUNITY): Payer: Medicare Other

## 2019-07-23 ENCOUNTER — Encounter: Payer: Medicare Other | Admitting: Physical Medicine & Rehabilitation

## 2019-07-29 ENCOUNTER — Ambulatory Visit (INDEPENDENT_AMBULATORY_CARE_PROVIDER_SITE_OTHER): Payer: Medicare Other | Admitting: Adult Health

## 2019-07-29 ENCOUNTER — Encounter: Payer: Self-pay | Admitting: Adult Health

## 2019-07-29 ENCOUNTER — Ambulatory Visit (INDEPENDENT_AMBULATORY_CARE_PROVIDER_SITE_OTHER): Payer: Medicare Other

## 2019-07-29 ENCOUNTER — Other Ambulatory Visit: Payer: Self-pay

## 2019-07-29 VITALS — BP 130/80 | HR 83 | Temp 98.0°F | Ht 65.0 in | Wt 178.2 lb

## 2019-07-29 DIAGNOSIS — J45909 Unspecified asthma, uncomplicated: Secondary | ICD-10-CM | POA: Diagnosis not present

## 2019-07-29 DIAGNOSIS — J4541 Moderate persistent asthma with (acute) exacerbation: Secondary | ICD-10-CM | POA: Diagnosis not present

## 2019-07-29 NOTE — Assessment & Plan Note (Signed)
Recent exacerbation now improved. We will continue on higher dose Symbicort.  Pharmacy will be contacted to verify prescription  Plan  Patient Instructions  Increase Symbicort 160 2 puffs Twice daily  Mucinex Twice daily  As needed  Cough /congestion  Delsym 2 tsp Twice daily  As needed  Cough .  Hold Fosamax for 1 month.  Sips of water to soothe throat .  No MINTS .  Saline nasal rinses Twice daily   Chest xray today  Follow up with Dr. Chase Caller in 2-3 months and As needed   Please contact office for sooner follow up if symptoms do not improve or worsen or seek emergency care

## 2019-07-29 NOTE — Patient Instructions (Addendum)
Increase Symbicort 160 2 puffs Twice daily  Mucinex Twice daily  As needed  Cough /congestion  Delsym 2 tsp Twice daily  As needed  Cough .  Hold Fosamax for 1 month.  Sips of water to soothe throat .  No MINTS .  Saline nasal rinses Twice daily   Chest xray today  Follow up with Dr. Chase Caller in 2-3 months and As needed   Please contact office for sooner follow up if symptoms do not improve or worsen or seek emergency care

## 2019-07-29 NOTE — Progress Notes (Signed)
@Patient  ID: [name] [name], female    DOB: [birth year], 69 y.o.   MRN: [id number]  Chief Complaint  Patient presents with  . Follow-up    Asthma     Referring provider: Neale Burly, MD  HPI: 69 yo female never smoker followed for Asthma    TEST/EVENTS :  Methacholine challenge test positiveandtracheobronchomalacia on CT OCt 2019 [place] 25 ppb 20108 CXR 06/14/18 -No active cardiopulmonary disease. HRCT 04/11/18 -No findings to suggest interstitial lung disease. Moderate air trapping indicative of small airways disease. 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 Innumerable tiny 1-2 mm pulmonary nodules scattered throughout the lungs bilaterally, stable dating back to 2017, considered definitively benign.  07/29/2019 Follow up: Asthma  Patient presents for a 1 month follow-up.  Patient was seen last visit for an asthmatic bronchitic exacerbation.  She was treated with Augmentin and a prednisone taper.  Her Symbicort was increased from 80-160.  Patient says she is starting to feel better.  She has decreased cough and wheezing.  Unfortunately she did not pick up her Symbicort because pharmacist did not have it.  Patient denies any chest pain orthopnea PND or increased leg swelling.   Allergies  Allergen Reactions  . Latex Itching and Rash  . Morphine And Related Hives and Itching  . Aspirin Nausea And Vomiting    Can take coated asa  . Biaxin [Clarithromycin] Nausea And Vomiting  . Clarithromycin Diarrhea  . Codeine Itching  . Darvocet [Propoxyphene N-Acetaminophen] Itching  . Hydrocodone-Acetaminophen Itching  . Lortab [Hydrocodone-Acetaminophen] Itching  . Percocet [Oxycodone-Acetaminophen] Itching  . Tramadol Nausea Only    Immunization History  Administered Date(s) Administered  . Fluad Quad(high Dose 65+) 04/01/2019  . Influenza Split 04/19/2013, 03/18/2014  . Influenza, High Dose Seasonal PF 04/25/2018  . Influenza,inj,Quad PF,6+ Mos 05/04/2015, 02/27/2017  .  Pneumococcal Polysaccharide-23 04/19/2013  . Zoster 05/30/2013    Past Medical History:  Diagnosis Date  . Acute sinusitis, unspecified   . Allergy   . Anemia    "quite a few times"  . Anxiety state, unspecified   . Arachnoiditis BILATERAL LEGS   DUE TO MULTIPLE BACK SURG.'S  . Arthritis   . Asthma    last flare up was 03/2017 lasted over a month  . Blood transfusion   . Cancer ([place])    skin cancers (in scalp)  . Cardiomyopathy HX --06/2010   EF was 25% during acute illness (PHELONEPHRITIS) Repeat echo 12-06-10 60% showed normal EF.   Marland Kitchen Chronic back pain greater than 3 months duration    S/P BACK SURG'S  . CSF leak   . Diabetes mellitus without complication ([place])    dx 2013 type 2  . Dyslipidemia   . Dysphagia    some post op cerv fusion 2/14  . Dysrhythmia   . Essential hypertension, benign   . Family history of adverse reaction to anesthesia    mother gets n/v  . GERD (gastroesophageal reflux disease) AND HIATIAL HERNIA   CONTROLLED W/ NEXIUM  . Headache(784.0)   . Heart murmur    DENIES S & S   (ECHO JUN'12 W/ CHART)  . History of chronic bronchitis   . Hx of bladder infections   . Hyperlipidemia    TAKE CHLOSTEROL MEDICATION,04/23/19  . Hypertension   . Murmur, heart   . Neuromuscular disorder (HCC)    numbness and tingling  . Osteoporosis   . Other malaise and fatigue   . PONV (postoperative nausea and vomiting)   . Shortness of breath   .  Spinal headache   . Spinal stenosis, cervical region   . Varicosities    venous  . Weakness of both legs DUE TO ARACHNOIDITIS   OCCASIONAL USES CANE    Tobacco History: Social History   Tobacco Use  Smoking Status Never Smoker  Smokeless Tobacco Never Used   Counseling given: Not Answered   Outpatient Medications Prior to Visit  Medication Sig Dispense Refill  . alendronate (FOSAMAX) 70 MG tablet Take 70 mg by mouth every Wednesday. Take with a full glass of water on an empty stomach.     . Ascorbic Acid  (VITAMIN C) 1000 MG tablet Take 1,000 mg by mouth daily.    Marland Kitchen aspirin EC 81 MG tablet Take 81 mg by mouth at bedtime.     . budesonide-formoterol (SYMBICORT) 80-4.5 MCG/ACT inhaler Inhale 2 puffs into the lungs 2 (two) times daily. 1 Inhaler 6  . Calcium Carbonate-Vit D-Min 1200-1000 MG-UNIT CHEW Chew 1 tablet by mouth daily.     Marland Kitchen diltiazem (CARDIZEM CD) 240 MG 24 hr capsule Take 240 mg by mouth daily.    Marland Kitchen estradiol (ESTRACE) 2 MG tablet Take 2 mg by mouth daily.     Marland Kitchen gabapentin (NEURONTIN) 800 MG tablet Take 1 tablet (800 mg total) by mouth 4 (four) times daily. 120 tablet 5  . levalbuterol (XOPENEX HFA) 45 MCG/ACT inhaler Inhale 1-2 puffs into the lungs every 4 (four) hours as needed. For shortness of breath 1 Inhaler 6  . losartan-hydrochlorothiazide (HYZAAR) 50-12.5 MG tablet Take 1 tablet by mouth daily.    . metFORMIN (GLUCOPHAGE) 500 MG tablet Take 500 mg by mouth 2 (two) times daily with a meal.     . montelukast (SINGULAIR) 10 MG tablet Take 10 mg by mouth at bedtime.    . naloxone (NARCAN) nasal spray 4 mg/0.1 mL Place 1 spray into the nose once as needed (Just in case of Opiod Overdose).    . nortriptyline (PAMELOR) 25 MG capsule TAKE 1 CAPSULE BY MOUTH AT BEDTIME 30 capsule 2  . omeprazole (PRILOSEC) 20 MG capsule Take 20 mg by mouth daily.    . Oxycodone HCl 10 MG TABS Take 1 tablet (10 mg total) by mouth 4 (four) times daily as needed. Do Not Fill Before 07/19/2019 120 tablet 0  . Oxymorphone HCl, Crush Resist, 40 MG PO T12A Take 40 mg by mouth 2 (two) times daily. Do Not Fill Before 07/19/2019 60 tablet 0  . promethazine (PHENERGAN) 25 MG tablet     . simvastatin (ZOCOR) 10 MG tablet Take 10 mg by mouth daily.    . valACYclovir (VALTREX) 500 MG tablet Take 500 mg by mouth daily.     . budesonide-formoterol (SYMBICORT) 160-4.5 MCG/ACT inhaler Inhale 2 puffs into the lungs 2 (two) times daily. (Patient not taking: Reported on 07/29/2019) 1 Inhaler 5  . diazepam (VALIUM) 10 MG tablet  Take 5 mg by mouth See admin instructions.     . Milk Thistle 200 MG CAPS Take 1 capsule by mouth daily.    . Omega-3 Fatty Acids (FISH OIL) 1000 MG CAPS Take 1 capsule by mouth 3 (three) times daily.    . predniSONE (DELTASONE) 10 MG tablet 4 tabs for 2 days, then 3 tabs for 2 days, 2 tabs for 2 days, then 1 tab for 2 days, then stop 20 tablet 0   Facility-Administered Medications Prior to Visit  Medication Dose Route Frequency Provider Last Rate Last Admin  . levalbuterol (XOPENEX) nebulizer solution 0.63  mg  0.63 mg Nebulization Once ,  S, NP         Review of Systems:   Constitutional:   No  weight loss, night sweats,  Fevers, chills, + fatigue, or  lassitude.  HEENT:   No headaches,  Difficulty swallowing,  Tooth/dental problems, or  Sore throat,                No sneezing, itching, ear ache, nasal congestion, post nasal drip,   CV:  No chest pain,  Orthopnea, PND, swelling in lower extremities, anasarca, dizziness, palpitations, syncope.   GI  No heartburn, indigestion, abdominal pain, nausea, vomiting, diarrhea, change in bowel habits, loss of appetite, bloody stools.   Resp: no chest wall deformity  Skin: no rash or lesions.  GU: no dysuria, change in color of urine, no urgency or frequency.  No flank pain, no hematuria   MS:  No joint pain or swelling.  No decreased range of motion.  No back pain.    Physical Exam  BP 130/80 (BP Location: Left Arm, Patient Position: Sitting, Cuff Size: Normal)   Pulse 83   Temp 98 F (36.7 C) (Temporal)   Ht 5\' 5"  (1.651 m)   Wt 178 lb 3.2 oz (80.8 kg)   SpO2 97%   BMI 29.65 kg/m   GEN: A/Ox3; pleasant , NAD, well nourished    HEENT:  /AT,   no postnasal drip or exudate noted.   NECK:  Supple w/ fair ROM; no JVD; normal carotid impulses w/o bruits; no thyromegaly or nodules palpated; no lymphadenopathy.    RESP  Clear  P & A; w/o, wheezes/ rales/ or rhonchi . no accessory muscle use, no dullness to percussion   CARD:  RRR, no m/r/g, no peripheral edema, pulses intact, no cyanosis or clubbing.  GI:   Soft & nt; nml bowel sounds; no organomegaly or masses detected.   Musco: Warm bil, no deformities or joint swelling noted.   Neuro: alert, no focal deficits noted.    Skin: Warm, no lesions or rashes    Lab Results:   BNP No results found for: BNP  ProBNP  Imaging: DG Chest 2 View  Result Date: 07/29/2019 CLINICAL DATA:  Asthma follow-up, hypertension EXAM: CHEST - 2 VIEW COMPARISON:  12/05/2018 FINDINGS: Heart and mediastinal contours are within normal limits. No focal opacities or effusions. No acute bony abnormality. IMPRESSION: No active cardiopulmonary disease. Electronically Signed   By: Rolm Baptise M.D.   On: 07/29/2019 15:06      No flowsheet data found.  Lab Results  Component Value Date   NITRICOXIDE 14 07/18/2018        Assessment & Plan:   Moderate persistent asthma with acute exacerbation Recent exacerbation now improved. We will continue on higher dose Symbicort.  Pharmacy will be contacted to verify prescription  Plan  Patient Instructions  Increase Symbicort 160 2 puffs Twice daily  Mucinex Twice daily  As needed  Cough /congestion  Delsym 2 tsp Twice daily  As needed  Cough .  Hold Fosamax for 1 month.  Sips of water to soothe throat .  No MINTS .  Saline nasal rinses Twice daily   Chest xray today  Follow up with Dr. Chase Caller in 2-3 months and As needed   Please contact office for sooner follow up if symptoms do not improve or worsen or seek emergency care         Total patient care time 31 minutes    , NP 07/29/2019

## 2019-07-30 ENCOUNTER — Other Ambulatory Visit: Payer: Self-pay

## 2019-07-30 ENCOUNTER — Encounter: Payer: Self-pay | Admitting: Physical Medicine & Rehabilitation

## 2019-07-30 ENCOUNTER — Ambulatory Visit (HOSPITAL_COMMUNITY)
Admission: RE | Admit: 2019-07-30 | Discharge: 2019-07-30 | Disposition: A | Discharge status: Home / Self Care | Payer: Medicare Other | Source: Ambulatory Visit | Attending: Vascular Surgery | Admitting: Vascular Surgery

## 2019-07-30 ENCOUNTER — Encounter: Payer: Medicare Other | Attending: Physical Medicine & Rehabilitation | Admitting: Physical Medicine & Rehabilitation

## 2019-07-30 VITALS — Temp 97.7°F | Ht 65.0 in | Wt 180.0 lb

## 2019-07-30 DIAGNOSIS — G992 Myelopathy in diseases classified elsewhere: Secondary | ICD-10-CM

## 2019-07-30 DIAGNOSIS — G039 Meningitis, unspecified: Secondary | ICD-10-CM | POA: Diagnosis not present

## 2019-07-30 DIAGNOSIS — M961 Postlaminectomy syndrome, not elsewhere classified: Secondary | ICD-10-CM | POA: Diagnosis not present

## 2019-07-30 DIAGNOSIS — R42 Dizziness and giddiness: Secondary | ICD-10-CM | POA: Diagnosis not present

## 2019-07-30 DIAGNOSIS — I700 Atherosclerosis of aorta: Secondary | ICD-10-CM | POA: Diagnosis not present

## 2019-07-30 DIAGNOSIS — M48061 Spinal stenosis, lumbar region without neurogenic claudication: Secondary | ICD-10-CM

## 2019-07-30 DIAGNOSIS — Z79891 Long term (current) use of opiate analgesic: Secondary | ICD-10-CM | POA: Diagnosis not present

## 2019-07-30 DIAGNOSIS — Z5181 Encounter for therapeutic drug level monitoring: Secondary | ICD-10-CM | POA: Diagnosis not present

## 2019-07-30 DIAGNOSIS — Z79899 Other long term (current) drug therapy: Secondary | ICD-10-CM | POA: Insufficient documentation

## 2019-07-30 DIAGNOSIS — G894 Chronic pain syndrome: Secondary | ICD-10-CM | POA: Insufficient documentation

## 2019-07-30 DIAGNOSIS — M5441 Lumbago with sciatica, right side: Secondary | ICD-10-CM | POA: Insufficient documentation

## 2019-07-30 MED ORDER — OXYCODONE HCL 10 MG PO TABS: 10.0000 mg | ORAL_TABLET | Freq: Four times a day (QID) | ORAL | 0 refills | Status: DC | PRN

## 2019-07-30 MED ORDER — NORTRIPTYLINE HCL 25 MG PO CAPS: 25.0000 mg | ORAL_CAPSULE | Freq: Every day | ORAL | 2 refills | Status: DC

## 2019-07-30 MED ORDER — OXYMORPHONE HCL ER 40 MG PO T12A: 40.0000 mg | EXTENDED_RELEASE_TABLET | Freq: Two times a day (BID) | ORAL | 0 refills | Status: DC

## 2019-07-30 NOTE — Progress Notes (Signed)
Subjective:    Patient ID: [name] [name], female    DOB: 04-27-1951, 69 y.o.   MRN: [id number]  HPI   Mrs Ansara is here in follow up of his chronic pain. She is pretty down right now as she lost a nephew to a heart attack.   Her pain levels are fairly consistent.    Pain Inventory Average Pain 7 Pain Right Now 7 My pain is sharp, burning, tingling and aching  In the last 24 hours, has pain interfered with the following? General activity 7 Relation with others 7 Enjoyment of life 7 What TIME of day is your pain at its worst? all Sleep (in general) Fair  Pain is worse with: bending, sitting, inactivity, standing and some activites Pain improves with: rest, heat/ice and medication Relief from Meds: 7  Mobility Do you have any goals in this area?  no  Function Do you have any goals in this area?  no  Neuro/Psych No problems in this area  Prior Studies Any changes since last visit?  no  Physicians involved in your care Any changes since last visit?  no   Family History  Problem Relation Age of Onset  . Hypertension Mother   . Heart disease Mother   . Diabetes Mother   . Cancer Father   . Diabetes Brother   . Colon cancer Neg Hx   . Colon polyps Neg Hx   . Esophageal cancer Neg Hx   . Rectal cancer Neg Hx   . Stomach cancer Neg Hx    Social History   Socioeconomic History  . Marital status: Divorced    Spouse name: Not on file  . Number of children: Not on file  . Years of education: Not on file  . Highest education level: Not on file  Occupational History  . Occupation: disable    Employer: DISABLED  Tobacco Use  . Smoking status: Never Smoker  . Smokeless tobacco: Never Used  Substance and Sexual Activity  . Alcohol use: No  . Drug use: No  . Sexual activity: Not on file    Comment: second hand smoke  Other Topics Concern  . Not on file  Social History Narrative   Has one child   Disabled   Social Determinants of Health   Financial  Resource Strain:   . Difficulty of Paying Living Expenses: Not on file  Food Insecurity:   . Worried About Charity fundraiser in the Last Year: Not on file  . Ran Out of Food in the Last Year: Not on file  Transportation Needs:   . Lack of Transportation (Medical): Not on file  . Lack of Transportation (Non-Medical): Not on file  Physical Activity:   . Days of Exercise per Week: Not on file  . Minutes of Exercise per Session: Not on file  Stress:   . Feeling of Stress : Not on file  Social Connections:   . Frequency of Communication with Friends and Family: Not on file  . Frequency of Social Gatherings with Friends and Family: Not on file  . Attends Religious Services: Not on file  . Active Member of Clubs or Organizations: Not on file  . Attends Archivist Meetings: Not on file  . Marital Status: Not on file   Past Surgical History:  Procedure Laterality Date  . ABDOMINAL HYSTERECTOMY  1987   W/ BSO  . ANTERIOR CERVICAL DECOMP/DISCECTOMY FUSION N/A 08/09/2012   Procedure: ANTERIOR CERVICAL DECOMPRESSION/DISCECTOMY FUSION  1 LEVEL;  Surgeon: Floyce Stakes, MD;  Location: [facility];  Service: Neurosurgery;  Laterality: N/A;  Cervical four-five  Anterior cervical decompression/diskectomy/fusion  . ANTERIOR FUSION CERVICAL SPINE  2007   C5 -6  . APPENDECTOMY    . APPLICATION OF INTRAOPERATIVE CT SCAN N/A 05/17/2017   Procedure: APPLICATION OF INTRAOPERATIVE CT SCAN;  Surgeon: Erline Levine, MD;  Location: [facility];  Service: Neurosurgery;  Laterality: N/A;  . BACK SURGERY     x 5  . BREAST EXCISIONAL BIOPSY Bilateral    No scar seen   . BREAST SURGERY     x 2 biopsies  . CARPAL TUNNEL RELEASE  RIGHT - 2001  & DEC 2011 W/ BACK SURG.  . CERVICAL DISC SURGERY  2005   C5 - 6  . CHOLECYSTECTOMY  1994  . DORSAL COMPARTMENT RELEASE Right 04/07/2013   Procedure: RIGHT WRIST STS RELEASE;  Surgeon: Schuyler Amor, MD;  Location: [facility];  Service:  Orthopedics;  Laterality: Right;  . ENDOVENOUS ABLATION SAPHENOUS VEIN W/ LASER Right 05/29/2019   endovenous laser ablation right greater saphenous vein by Gae Gallop MD   . [place] CSF LEAK  [place] 2011  X2   POST [place]  . FINGER ARTHRODESIS Right 04/07/2013   Procedure: RIGHT INDEX AND RIGHT LONG DISTAL INTERPHALANGEAL JOINT FUSIONS;  Surgeon: Schuyler Amor, MD;  Location: [facility];  Service: Orthopedics;  Laterality: Right;  . FINGER ARTHROPLASTY Right 04/07/2013   Procedure: RIGHT THUMB [place] ARTHROPLASTY;  Surgeon: Schuyler Amor, MD;  Location: [facility];  Service: Orthopedics;  Laterality: Right;  . GANGLION CYST EXCISION Right 04/07/2013   Procedure: RIGHT WRIST MASS EXCISION;  Surgeon: Schuyler Amor, MD;  Location: [facility];  Service: Orthopedics;  Laterality: Right;  . KNEE ARTHROSCOPY  LEFT X3 (LAST ONE 2005)  . KNEE ARTHROSCOPY  05/17/2011   Procedure: ARTHROSCOPY KNEE;  Surgeon: Bradley Ferris III;  Location: [facility];  Service: Orthopedics;  Laterality: Right;  WITH MEDIAL MENISECTOMY AND removal of suprapatella fat lump  . LEFT WRIST TENOSYNECTOMY W/ LEFT THUMB JOINT REPAIR  02-24-10  . LUMBAR FUSION  11-30-10   L4 - 5  . LUMBAR FUSION  2000   L2 - 4  . LUMBAR LAMINECTOMY  DEC 2011   L4 - 5  . POSTERIOR CERVICAL FUSION/FORAMINOTOMY N/A 05/17/2017   Procedure: Cervical five  to Thorastic one  Posterior cervical fusion with lateral mass screws/revision of prior instrumentation;  Surgeon: Erline Levine, MD;  Location: [facility];  Service: Neurosurgery;  Laterality: N/A;  C5 to T1 Posterior cervical fusion with lateral mass screws/revision of prior instrumentation  . TENDON REPAIR  JAN 2010   LEFT INDEX AND LONG FINGERS  . THUMB SURGERY   04/07/2019   RIGHT THUMB  . TUBAL LIGATION     Past Medical History:  Diagnosis Date  . Acute sinusitis, unspecified   . Allergy   .  Anemia    "quite a few times"  . Anxiety state, unspecified   . Arachnoiditis BILATERAL LEGS   DUE TO MULTIPLE BACK SURG.'S  . Arthritis   . Asthma    last flare up was 03/2017 lasted over a month  . Blood transfusion   . Cancer ([place])    skin cancers (in scalp)  . Cardiomyopathy HX --06/2010   EF was 25% during acute illness (PHELONEPHRITIS) Repeat echo 12-06-10 60% showed normal EF.   Marland Kitchen  Chronic back pain greater than 3 months duration    S/P BACK SURG'S  . CSF leak   . Diabetes mellitus without complication ([place])    dx 2013 type 2  . Dyslipidemia   . Dysphagia    some post op cerv fusion 2/14  . Dysrhythmia   . Essential hypertension, benign   . Family history of adverse reaction to anesthesia    mother gets n/v  . GERD (gastroesophageal reflux disease) AND HIATIAL HERNIA   CONTROLLED W/ NEXIUM  . Headache(784.0)   . Heart murmur    DENIES S & S   (ECHO JUN'12 W/ CHART)  . History of chronic bronchitis   . Hx of bladder infections   . Hyperlipidemia    TAKE CHLOSTEROL MEDICATION,04/23/19  . Hypertension   . Murmur, heart   . Neuromuscular disorder (HCC)    numbness and tingling  . Osteoporosis   . Other malaise and fatigue   . PONV (postoperative nausea and vomiting)   . Shortness of breath   . Spinal headache   . Spinal stenosis, cervical region   . Varicosities    venous  . Weakness of both legs DUE TO ARACHNOIDITIS   OCCASIONAL USES CANE   Temp 97.7 F (36.5 C)   Ht 5\' 5"  (1.651 m)   Wt 180 lb (81.6 kg)   BMI 29.95 kg/m   Opioid Risk Score:   [place] Risk Score:  `1  Depression screen PHQ 2/9  Depression screen [facility] 2/9 06/25/2019 10/30/2018 08/14/2018 03/12/2018 10/25/2017 09/27/2017 07/02/2017  Decreased Interest 0 0 0 0 0 0 1  Down, Depressed, Hopeless 0 0 - 0 0 0 1  PHQ - 2 Score 0 0 0 0 0 0 2  Altered sleeping - - - - - 0 -  Tired, decreased energy - - - - - 0 -  Change in appetite - - - - - 0 -  Feeling bad or failure about yourself  - - - - - 0 -    Trouble concentrating - - - - - 0 -  Moving slowly or fidgety/restless - - - - - 0 -  Suicidal thoughts - - - - - 0 -  PHQ-9 Score - - - - - 0 -  Some recent data might be hidden    Review of Systems  Constitutional: Negative.   HENT: Negative.   Eyes: Negative.   Respiratory: Negative.   Cardiovascular: Negative.   Endocrine: Negative.   Genitourinary: Negative.   Musculoskeletal: Negative.   Skin: Negative.   Allergic/Immunologic: Negative.   Hematological: Negative.   Psychiatric/Behavioral: Negative.   All other systems reviewed and are negative.      Objective:   Physical Exam Gen: no distress, normal appearing HEENT: oral mucosa pink and moist, NCAT Cardio: Reg rate Chest: normal effort, normal rate of breathing Abd: soft, non-distended Ext: no edema Skin: intact Neuro: UE motor 5/5. LE limited by pain 3+ to 4/5. Decreased LT in lower ext. DTR's absent in LE's Musculoskeletal: LB TTP. Legs sensitive to touch. Atrophy of cervical paraspinals. Right 1st and 2nd finger surgery sites along DIP's with slight swelling and TTP. Psych: pleasant, normal affect        Assessment & Plan:  1. On 05/17/2017 :C5C6C7 T1 Posterior Cervical Fusion with lateral mass fixation with AIRO Imaging, revision of prior instrumentation. By Dr. Vertell Limber.  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              -  Gabapentin800 mg BID.Marland Kitchencontinue for now              -Oxycodone 15mg  one tablet every 6 hours as needed # 120 and Oxymorphone HCL 40 mg every 12 hours #60.             --We will continue the controlled substance monitoring program, this consists of regular clinic visits, examinations, routine drug screening, pill counts as well as use of [place] Controlled Substance Reporting System. [place] today.     -Medication was refilled and a second prescription was sent to the patient's pharmacy for next month.      -discussed weaning  of oxycodone which she wants to do. 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             -encouraged activity/ROM to tolerance 3. . Anxiety/depression: Continue Valium, PCP Following.            -is coping fairly well at present   4. Muscle Spasms:  No flexeril d/t GI upset.   5. Cervicalgia/ Cervical Radiculitis:  Dr. Vertell Limber Following: S/P C5C6C7 T1 Posterior Cervical Fusion with lateral mass fixation with AIRO Imaging, revision of prior instrumentation by Dr. Vertell Limber on 05/17/2017.              -gabapentin as above             -ROM exercises discussed here as well..  7. Left hand OA, trigger fingers---per hand surgery   8. Left lower extremity DVT/ Phlebitis: S/P endovenous laser ablation of left great saphenous vein on 05/09/2018 by Dr. Scot Dock:    Fifteen minutes of face to face patient care time were spent during this visit. All questions were encouraged and answered.  Follow up with NP in about 2 mos .

## 2019-07-30 NOTE — Patient Instructions (Signed)
COVID-19 Vaccine Information can be found at: [email or web address] For questions related to vaccine distribution or appointments, please email [email or web address] or call [phone].    

## 2019-08-01 ENCOUNTER — Telehealth: Payer: Self-pay

## 2019-08-01 NOTE — Telephone Encounter (Signed)
Spoke to pt regarding her insurance not allowing [place] to pre-cert for sclerotherapy. We can not guarantee sclero will be covered. Pt is going to try to contact her insurance company. She will call to schedule an appt at a later date.

## 2019-08-19 DIAGNOSIS — M7989 Other specified soft tissue disorders: Secondary | ICD-10-CM | POA: Diagnosis not present

## 2019-08-19 DIAGNOSIS — M65331 Trigger finger, right middle finger: Secondary | ICD-10-CM | POA: Diagnosis not present

## 2019-08-19 DIAGNOSIS — M7541 Impingement syndrome of right shoulder: Secondary | ICD-10-CM | POA: Diagnosis not present

## 2019-08-19 DIAGNOSIS — M19049 Primary osteoarthritis, unspecified hand: Secondary | ICD-10-CM | POA: Diagnosis not present

## 2019-08-19 DIAGNOSIS — M180 Bilateral primary osteoarthritis of first carpometacarpal joints: Secondary | ICD-10-CM | POA: Diagnosis not present

## 2019-08-22 ENCOUNTER — Ambulatory Visit (INDEPENDENT_AMBULATORY_CARE_PROVIDER_SITE_OTHER): Payer: Medicare Other

## 2019-08-22 ENCOUNTER — Other Ambulatory Visit: Payer: Self-pay | Admitting: Gastroenterology

## 2019-08-22 DIAGNOSIS — Z1159 Encounter for screening for other viral diseases: Secondary | ICD-10-CM | POA: Diagnosis not present

## 2019-08-23 LAB — SARS CORONAVIRUS 2 (TAT 6-24 HRS): SARS Coronavirus 2: NEGATIVE

## 2019-08-26 ENCOUNTER — Telehealth: Payer: Self-pay | Admitting: Gastroenterology

## 2019-08-26 ENCOUNTER — Encounter: Payer: Medicare Other | Admitting: Gastroenterology

## 2019-08-26 NOTE — Telephone Encounter (Signed)
See last few phone messages with several late [place] cancellations. Please send discharge letter.

## 2019-08-26 NOTE — Telephone Encounter (Signed)
Letter created and placed on your desk.  Please review and sign both letter and attached form

## 2019-09-02 NOTE — Telephone Encounter (Signed)
Letter sent to HIM

## 2019-09-15 ENCOUNTER — Encounter: Payer: Medicare Other | Attending: Physical Medicine & Rehabilitation | Admitting: Registered Nurse

## 2019-09-15 ENCOUNTER — Other Ambulatory Visit: Payer: Self-pay

## 2019-09-15 ENCOUNTER — Encounter: Payer: Self-pay | Admitting: Registered Nurse

## 2019-09-15 VITALS — BP 129/76 | HR 86 | Temp 97.5°F | Ht 65.0 in | Wt 180.0 lb

## 2019-09-15 DIAGNOSIS — M5441 Lumbago with sciatica, right side: Secondary | ICD-10-CM | POA: Insufficient documentation

## 2019-09-15 DIAGNOSIS — G894 Chronic pain syndrome: Secondary | ICD-10-CM | POA: Insufficient documentation

## 2019-09-15 DIAGNOSIS — M4802 Spinal stenosis, cervical region: Secondary | ICD-10-CM | POA: Diagnosis not present

## 2019-09-15 DIAGNOSIS — Z5181 Encounter for therapeutic drug level monitoring: Secondary | ICD-10-CM | POA: Diagnosis not present

## 2019-09-15 DIAGNOSIS — M542 Cervicalgia: Secondary | ICD-10-CM | POA: Diagnosis not present

## 2019-09-15 DIAGNOSIS — Z79891 Long term (current) use of opiate analgesic: Secondary | ICD-10-CM | POA: Diagnosis not present

## 2019-09-15 DIAGNOSIS — G039 Meningitis, unspecified: Secondary | ICD-10-CM

## 2019-09-15 DIAGNOSIS — M961 Postlaminectomy syndrome, not elsewhere classified: Secondary | ICD-10-CM | POA: Diagnosis not present

## 2019-09-15 DIAGNOSIS — G959 Disease of spinal cord, unspecified: Secondary | ICD-10-CM | POA: Diagnosis not present

## 2019-09-15 DIAGNOSIS — Z79899 Other long term (current) drug therapy: Secondary | ICD-10-CM | POA: Insufficient documentation

## 2019-09-15 DIAGNOSIS — M5412 Radiculopathy, cervical region: Secondary | ICD-10-CM | POA: Diagnosis not present

## 2019-09-15 MED ORDER — OXYCODONE HCL 10 MG PO TABS: 10.0000 mg | ORAL_TABLET | Freq: Four times a day (QID) | ORAL | 0 refills | Status: DC | PRN

## 2019-09-15 MED ORDER — OXYMORPHONE HCL ER 40 MG PO T12A: 40.0000 mg | EXTENDED_RELEASE_TABLET | Freq: Two times a day (BID) | ORAL | 0 refills | Status: DC

## 2019-09-15 NOTE — Progress Notes (Signed)
Subjective:    Patient ID: [name] [name], female    DOB: 05-Dec-1950, 69 y.o.   MRN: [id number]  HPI: [name] [name] is a 69 y.o. female who returns for follow up appointment for chronic pain and medication refill. She states her pain is located in her neck radiating into her occipital region, bilateral shoulders, mid- lower back pain radiating into her bilateral lower extremities,  And bilateral hip pain. She rates her pain 8. Her current exercise regime is walking and performing stretching exercises.  [name] [name] has a vertebrae protrusion noted and has an appointment with Dr Vertell Limber today, she reports. She was instructed to keep [place] updated on her care. She verbalizes understanding.   [name] [name] Morphine equivalent is 300.00  MME.  UDS ordered today.  Pain Inventory Average Pain 7 Pain Right Now 8 My pain is sharp, burning, stabbing and aching  In the last 24 hours, has pain interfered with the following? General activity 7 Relation with others 7 Enjoyment of life 7 What TIME of day is your pain at its worst? all Sleep (in general) Fair  Pain is worse with: walking, sitting, inactivity and some activites Pain improves with: medication Relief from Meds: 7  Mobility use a cane how many minutes can you walk? 15 ability to climb steps?  yes do you drive?  yes  Function disabled: date disabled 11/1999  Neuro/Psych No problems in this area  Prior Studies none  Physicians involved in your care Primary care .   Family History  Problem Relation Age of Onset  . Hypertension Mother   . Heart disease Mother   . Diabetes Mother   . Cancer Father   . Diabetes Brother   . Colon cancer Neg Hx   . Colon polyps Neg Hx   . Esophageal cancer Neg Hx   . Rectal cancer Neg Hx   . Stomach cancer Neg Hx    Social History   Socioeconomic History  . Marital status: Divorced    Spouse name: Not on file  . Number of children: Not on file  . Years of education: Not on file  . Highest  education level: Not on file  Occupational History  . Occupation: disable    Employer: DISABLED  Tobacco Use  . Smoking status: Never Smoker  . Smokeless tobacco: Never Used  Substance and Sexual Activity  . Alcohol use: No  . Drug use: No  . Sexual activity: Not on file    Comment: second hand smoke  Other Topics Concern  . Not on file  Social History Narrative   Has one child   Disabled   Social Determinants of Health   Financial Resource Strain:   . Difficulty of Paying Living Expenses:   Food Insecurity:   . Worried About Charity fundraiser in the Last Year:   . Arboriculturist in the Last Year:   Transportation Needs:   . Film/video editor (Medical):   Marland Kitchen Lack of Transportation (Non-Medical):   Physical Activity:   . Days of Exercise per Week:   . Minutes of Exercise per Session:   Stress:   . Feeling of Stress :   Social Connections:   . Frequency of Communication with Friends and Family:   . Frequency of Social Gatherings with Friends and Family:   . Attends Religious Services:   . Active Member of Clubs or Organizations:   . Attends Archivist Meetings:   Marland Kitchen Marital  Status:    Past Surgical History:  Procedure Laterality Date  . ABDOMINAL HYSTERECTOMY  1987   W/ BSO  . ANTERIOR CERVICAL DECOMP/DISCECTOMY FUSION N/A 08/09/2012   Procedure: ANTERIOR CERVICAL DECOMPRESSION/DISCECTOMY FUSION 1 LEVEL;  Surgeon: Floyce Stakes, MD;  Location: [facility];  Service: Neurosurgery;  Laterality: N/A;  Cervical four-five  Anterior cervical decompression/diskectomy/fusion  . ANTERIOR FUSION CERVICAL SPINE  2007   C5 -6  . APPENDECTOMY    . APPLICATION OF INTRAOPERATIVE CT SCAN N/A 05/17/2017   Procedure: APPLICATION OF INTRAOPERATIVE CT SCAN;  Surgeon: Erline Levine, MD;  Location: [place];  Service: Neurosurgery;  Laterality: N/A;  . BACK SURGERY     x 5  . BREAST EXCISIONAL BIOPSY Bilateral    No scar seen   . BREAST SURGERY     x 2 biopsies  .  CARPAL TUNNEL RELEASE  RIGHT - 2001  & DEC 2011 W/ BACK SURG.  . CERVICAL DISC SURGERY  2005   C5 - 6  . CHOLECYSTECTOMY  1994  . DORSAL COMPARTMENT RELEASE Right 04/07/2013   Procedure: RIGHT WRIST STS RELEASE;  Surgeon: Schuyler Amor, MD;  Location: [place];  Service: Orthopedics;  Laterality: Right;  . ENDOVENOUS ABLATION SAPHENOUS VEIN W/ LASER Right 05/29/2019   endovenous laser ablation right greater saphenous vein by Gae Gallop MD   . [place] CSF LEAK  [place] 2011  X2   POST [place]  . FINGER ARTHRODESIS Right 04/07/2013   Procedure: RIGHT INDEX AND RIGHT LONG DISTAL INTERPHALANGEAL JOINT FUSIONS;  Surgeon: Schuyler Amor, MD;  Location: [place];  Service: Orthopedics;  Laterality: Right;  . FINGER ARTHROPLASTY Right 04/07/2013   Procedure: RIGHT THUMB [place] ARTHROPLASTY;  Surgeon: Schuyler Amor, MD;  Location: [place];  Service: Orthopedics;  Laterality: Right;  . GANGLION CYST EXCISION Right 04/07/2013   Procedure: RIGHT WRIST MASS EXCISION;  Surgeon: Schuyler Amor, MD;  Location: [place];  Service: Orthopedics;  Laterality: Right;  . KNEE ARTHROSCOPY  LEFT X3 (LAST ONE 2005)  . KNEE ARTHROSCOPY  05/17/2011   Procedure: ARTHROSCOPY KNEE;  Surgeon: Bradley Ferris III;  Location: [place];  Service: Orthopedics;  Laterality: Right;  WITH MEDIAL MENISECTOMY AND removal of suprapatella fat lump  . LEFT WRIST TENOSYNECTOMY W/ LEFT THUMB JOINT REPAIR  02-24-10  . LUMBAR FUSION  11-30-10   L4 - 5  . LUMBAR FUSION  2000   L2 - 4  . LUMBAR LAMINECTOMY  DEC 2011   L4 - 5  . POSTERIOR CERVICAL FUSION/FORAMINOTOMY N/A 05/17/2017   Procedure: Cervical five  to Thorastic one  Posterior cervical fusion with lateral mass screws/revision of prior instrumentation;  Surgeon: Erline Levine, MD;  Location: [place];  Service: Neurosurgery;  Laterality: N/A;  C5 to T1  Posterior cervical fusion with lateral mass screws/revision of prior instrumentation  . TENDON REPAIR  JAN 2010   LEFT INDEX AND LONG FINGERS  . THUMB SURGERY   04/07/2019   RIGHT THUMB  . TUBAL LIGATION     Past Medical History:  Diagnosis Date  . Acute sinusitis, unspecified   . Allergy   . Anemia    "quite a few times"  . Anxiety state, unspecified   . Arachnoiditis BILATERAL LEGS   DUE TO MULTIPLE BACK SURG.'S  . Arthritis   . Asthma    last flare up was 03/2017 lasted over a month  .  Blood transfusion   . Cancer ([place])    skin cancers (in scalp)  . Cardiomyopathy HX --06/2010   EF was 25% during acute illness (PHELONEPHRITIS) Repeat echo 12-06-10 60% showed normal EF.   Marland Kitchen Chronic back pain greater than 3 months duration    S/P BACK SURG'S  . CSF leak   . Diabetes mellitus without complication ([place])    dx 2013 type 2  . Dyslipidemia   . Dysphagia    some post op cerv fusion 2/14  . Dysrhythmia   . Essential hypertension, benign   . Family history of adverse reaction to anesthesia    mother gets n/v  . GERD (gastroesophageal reflux disease) AND HIATIAL HERNIA   CONTROLLED W/ NEXIUM  . Headache(784.0)   . Heart murmur    DENIES S & S   (ECHO JUN'12 W/ CHART)  . History of chronic bronchitis   . Hx of bladder infections   . Hyperlipidemia    TAKE CHLOSTEROL MEDICATION,04/23/19  . Hypertension   . Murmur, heart   . Neuromuscular disorder (HCC)    numbness and tingling  . Osteoporosis   . Other malaise and fatigue   . PONV (postoperative nausea and vomiting)   . Shortness of breath   . Spinal headache   . Spinal stenosis, cervical region   . Varicosities    venous  . Weakness of both legs DUE TO ARACHNOIDITIS   OCCASIONAL USES CANE   BP 129/76   Pulse 86   Temp (!) 97.5 F (36.4 C)   Ht 5\' 5"  (1.651 m)   Wt 180 lb (81.6 kg)   SpO2 95%   BMI 29.95 kg/m   Opioid Risk Score:   Fall Risk Score:  `1  Depression screen PHQ 2/9  Depression screen [facility]  2/9 06/25/2019 10/30/2018 08/14/2018 03/12/2018 10/25/2017 09/27/2017 07/02/2017  Decreased Interest 0 0 0 0 0 0 1  Down, Depressed, Hopeless 0 0 - 0 0 0 1  PHQ - 2 Score 0 0 0 0 0 0 2  Altered sleeping - - - - - 0 -  Tired, decreased energy - - - - - 0 -  Change in appetite - - - - - 0 -  Feeling bad or failure about yourself  - - - - - 0 -  Trouble concentrating - - - - - 0 -  Moving slowly or fidgety/restless - - - - - 0 -  Suicidal thoughts - - - - - 0 -  PHQ-9 Score - - - - - 0 -  Some recent data might be hidden      Review of Systems  All other systems reviewed and are negative.      Objective:   Physical Exam Vitals and nursing note reviewed.  Constitutional:      Appearance: Normal appearance.  Neck:     Comments: Cervical Paraspinal Tenderness: C-5-C-6 Cardiovascular:     Rate and Rhythm: Normal rate and regular rhythm.     Pulses: Normal pulses.     Heart sounds: Normal heart sounds.  Pulmonary:     Effort: Pulmonary effort is normal.     Breath sounds: Normal breath sounds.  Musculoskeletal:     Cervical back: Normal range of motion and neck supple.     Comments: Normal Muscle Bulk and Muscle Testing Reveals:  Upper Extremities: Full ROM and Muscle Strength 5/5 Thoracic and  Lumbar Hypersensitivity Lower Extremities: Full ROM and Muscle Strength 5/5 Arises from Chair slowly  using cane for support Narrow Based Gait   Skin:    General: Skin is warm and dry.  Neurological:     Mental Status: She is alert and oriented to person, place, and time.  Psychiatric:        Mood and Affect: Mood normal.        Behavior: Behavior normal.           Assessment & Plan:  1. On 05/17/2017 :C5C6C7 T1 Posterior Cervical Fusion with lateral mass fixation with AIRO Imaging, revision of prior instrumentation. By Dr. Vertell Limber.  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 Continuecurrent medication regimen withGabapentin800  mgBID.Refilled:Oxycodone 10mg  one tablet every 6 hours as needed #120tabletsand Oxymorphone HCL 40 mg every 12 hours #60.09/15/2019 We will continue the opioid monitoring program, this consists of regular clinic visits, examinations, urine drug screen, pill counts as well as use of [place] Controlled Substance Reporting System. 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 Anxiety/depression: PCP Following. Continueto monitor.09/15/2019 4. Muscle Spasms: Continuecurrent medication regime withFlexeril. Continue to Monitor.09/15/2019 5. Cervicalgia/ Cervical Radiculitis: Continuecurrent medication regime withGabapentin: Dr. Vertell Limber Following: S/P C5C6C7 T1 Posterior Cervical Fusion with lateral mass fixation with AIRO Imaging, revision of prior instrumentation by Dr. Vertell Limber on 05/17/2017.09/15/2019 7. Bilateral Thoracic Back Pain:Continue HEP as Tolerated and Continue current medication regimen. Continue to Monitor.09/15/2019 8. Left lower extremity DVT/ Phlebitis: S/P endovenous laser ablation of left great saphenous vein on 05/09/2018 by Dr. Scot Dock: Vascular Following.09/15/2019. 9. Insomnia:ContinuePamelor. Continue to Monitor.09/15/2019.   F/U in 1 month  15 minutes of face to face patient care time was spent during this visit. All questions were encouraged and answered.

## 2019-09-18 LAB — TOXASSURE SELECT,+ANTIDEPR,UR

## 2019-09-22 ENCOUNTER — Telehealth: Payer: Self-pay | Admitting: *Deleted

## 2019-09-22 NOTE — Telephone Encounter (Signed)
Urine drug screen for this encounter is consistent for prescribed medication. There is a small about of metabolite of diazepam, for which she has been prescribed in 2020.

## 2019-09-29 ENCOUNTER — Ambulatory Visit: Payer: Medicare Other | Admitting: Internal Medicine

## 2019-10-07 DIAGNOSIS — D239 Other benign neoplasm of skin, unspecified: Secondary | ICD-10-CM | POA: Diagnosis not present

## 2019-10-07 DIAGNOSIS — I10 Essential (primary) hypertension: Secondary | ICD-10-CM | POA: Diagnosis not present

## 2019-10-07 DIAGNOSIS — E119 Type 2 diabetes mellitus without complications: Secondary | ICD-10-CM | POA: Diagnosis not present

## 2019-10-07 DIAGNOSIS — D485 Neoplasm of uncertain behavior of skin: Secondary | ICD-10-CM | POA: Diagnosis not present

## 2019-10-07 DIAGNOSIS — E782 Mixed hyperlipidemia: Secondary | ICD-10-CM | POA: Diagnosis not present

## 2019-10-07 DIAGNOSIS — J0191 Acute recurrent sinusitis, unspecified: Secondary | ICD-10-CM | POA: Diagnosis not present

## 2019-10-10 ENCOUNTER — Encounter: Payer: Medicare Other | Admitting: Registered Nurse

## 2019-10-13 ENCOUNTER — Encounter: Payer: Medicare Other | Attending: Physical Medicine & Rehabilitation | Admitting: Registered Nurse

## 2019-10-13 ENCOUNTER — Other Ambulatory Visit: Payer: Self-pay

## 2019-10-13 ENCOUNTER — Other Ambulatory Visit: Payer: Self-pay | Admitting: Registered Nurse

## 2019-10-13 ENCOUNTER — Encounter: Payer: Self-pay | Admitting: Registered Nurse

## 2019-10-13 VITALS — BP 126/74 | HR 92 | Temp 97.5°F | Ht 65.0 in | Wt 179.2 lb

## 2019-10-13 DIAGNOSIS — M5412 Radiculopathy, cervical region: Secondary | ICD-10-CM | POA: Diagnosis not present

## 2019-10-13 DIAGNOSIS — G8929 Other chronic pain: Secondary | ICD-10-CM | POA: Diagnosis not present

## 2019-10-13 DIAGNOSIS — M5416 Radiculopathy, lumbar region: Secondary | ICD-10-CM

## 2019-10-13 DIAGNOSIS — Z79899 Other long term (current) drug therapy: Secondary | ICD-10-CM | POA: Diagnosis not present

## 2019-10-13 DIAGNOSIS — Z5181 Encounter for therapeutic drug level monitoring: Secondary | ICD-10-CM | POA: Diagnosis not present

## 2019-10-13 DIAGNOSIS — M5441 Lumbago with sciatica, right side: Secondary | ICD-10-CM | POA: Diagnosis not present

## 2019-10-13 DIAGNOSIS — M546 Pain in thoracic spine: Secondary | ICD-10-CM

## 2019-10-13 DIAGNOSIS — M961 Postlaminectomy syndrome, not elsewhere classified: Secondary | ICD-10-CM

## 2019-10-13 DIAGNOSIS — M542 Cervicalgia: Secondary | ICD-10-CM | POA: Diagnosis not present

## 2019-10-13 DIAGNOSIS — Z79891 Long term (current) use of opiate analgesic: Secondary | ICD-10-CM

## 2019-10-13 DIAGNOSIS — G039 Meningitis, unspecified: Secondary | ICD-10-CM | POA: Diagnosis not present

## 2019-10-13 DIAGNOSIS — G894 Chronic pain syndrome: Secondary | ICD-10-CM | POA: Diagnosis not present

## 2019-10-13 MED ORDER — OXYCODONE HCL 10 MG PO TABS: 10.0000 mg | ORAL_TABLET | Freq: Four times a day (QID) | ORAL | 0 refills | Status: DC | PRN

## 2019-10-13 MED ORDER — NORTRIPTYLINE HCL 25 MG PO CAPS: 25.0000 mg | ORAL_CAPSULE | Freq: Every day | ORAL | 2 refills | Status: DC

## 2019-10-13 MED ORDER — OXYMORPHONE HCL ER 40 MG PO T12A: 40.0000 mg | EXTENDED_RELEASE_TABLET | Freq: Two times a day (BID) | ORAL | 0 refills | Status: DC

## 2019-10-13 NOTE — Progress Notes (Signed)
Subjective:    Patient ID: [name] [name], female    DOB: 03/08/1951, 69 y.o.   MRN: [id number]  HPI: [name] [name] is a 69 y.o. female who returns for follow up appointment for chronic pain and medication refill. She states her pain is located in her neck radiating into her bilateral shoulders, mid- lower back pain radiating into her buttocks and bilateral lower extremities. She rates her pain 7. Her current exercise regime is walking and performing stretching exercises.  [name] Morphine equivalent is 300.00 MME.  She is also prescribed Diazepam, she is using it sparingly by Dr. Sherrie Sport, last prescription was filled on 10/08/2018, it was consistent. We have discussed the black box warning of using opioids and benzodiazepines. I highlighted the dangers of using these drugs together and discussed the adverse events including respiratory suppression, overdose, cognitive impairment and importance of compliance with current regimen. We will continue to monitor and adjust as indicated.   Last UDS was Performed on 09/15/2019, it was consistent.   Pain Inventory Average Pain 7 Pain Right Now 7 My pain is constant, sharp, burning, stabbing, tingling and aching  In the last 24 hours, has pain interfered with the following? General activity 7 Relation with others 7 Enjoyment of life 7 What TIME of day is your pain at its worst? all Sleep (in general) Fair  Pain is worse with: walking, sitting, standing and some activites Pain improves with: rest, heat/ice and medication Relief from Meds: 7  Mobility Do you have any goals in this area?  no  Function Do you have any goals in this area?  no  Neuro/Psych No problems in this area  Prior Studies Any changes since last visit?  no  Physicians involved in your care Any changes since last visit?  no   Family History  Problem Relation Age of Onset  . Hypertension Mother   . Heart disease Mother   . Diabetes Mother   . Cancer Father     . Diabetes Brother   . Colon cancer Neg Hx   . Colon polyps Neg Hx   . Esophageal cancer Neg Hx   . Rectal cancer Neg Hx   . Stomach cancer Neg Hx    Social History   Socioeconomic History  . Marital status: Divorced    Spouse name: Not on file  . Number of children: Not on file  . Years of education: Not on file  . Highest education level: Not on file  Occupational History  . Occupation: disable    Employer: DISABLED  Tobacco Use  . Smoking status: Never Smoker  . Smokeless tobacco: Never Used  Substance and Sexual Activity  . Alcohol use: No  . Drug use: No  . Sexual activity: Not on file    Comment: second hand smoke  Other Topics Concern  . Not on file  Social History Narrative   Has one child   Disabled   Social Determinants of Health   Financial Resource Strain:   . Difficulty of Paying Living Expenses:   Food Insecurity:   . Worried About Charity fundraiser in the Last Year:   . Arboriculturist in the Last Year:   Transportation Needs:   . Film/video editor (Medical):   Marland Kitchen Lack of Transportation (Non-Medical):   Physical Activity:   . Days of Exercise per Week:   . Minutes of Exercise per Session:   Stress:   . Feeling of Stress :  Social Connections:   . Frequency of Communication with Friends and Family:   . Frequency of Social Gatherings with Friends and Family:   . Attends Religious Services:   . Active Member of Clubs or Organizations:   . Attends Archivist Meetings:   Marland Kitchen Marital Status:    Past Surgical History:  Procedure Laterality Date  . ABDOMINAL HYSTERECTOMY  1987   W/ BSO  . ANTERIOR CERVICAL DECOMP/DISCECTOMY FUSION N/A 08/09/2012   Procedure: ANTERIOR CERVICAL DECOMPRESSION/DISCECTOMY FUSION 1 LEVEL;  Surgeon: Floyce Stakes, MD;  Location: [facility];  Service: Neurosurgery;  Laterality: N/A;  Cervical four-five  Anterior cervical decompression/diskectomy/fusion  . ANTERIOR FUSION CERVICAL SPINE  2007   C5 -6   . APPENDECTOMY    . APPLICATION OF INTRAOPERATIVE CT SCAN N/A 05/17/2017   Procedure: APPLICATION OF INTRAOPERATIVE CT SCAN;  Surgeon: Erline Levine, MD;  Location: [place];  Service: Neurosurgery;  Laterality: N/A;  . BACK SURGERY     x 5  . BREAST EXCISIONAL BIOPSY Bilateral    No scar seen   . BREAST SURGERY     x 2 biopsies  . CARPAL TUNNEL RELEASE  RIGHT - 2001  & DEC 2011 W/ BACK SURG.  . CERVICAL DISC SURGERY  2005   C5 - 6  . CHOLECYSTECTOMY  1994  . DORSAL COMPARTMENT RELEASE Right 04/07/2013   Procedure: RIGHT WRIST STS RELEASE;  Surgeon: Schuyler Amor, MD;  Location: [place];  Service: Orthopedics;  Laterality: Right;  . ENDOVENOUS ABLATION SAPHENOUS VEIN W/ LASER Right 05/29/2019   endovenous laser ablation right greater saphenous vein by Gae Gallop MD   . [place] CSF LEAK  [place] 2011  X2   POST [place]  . FINGER ARTHRODESIS Right 04/07/2013   Procedure: RIGHT INDEX AND RIGHT LONG DISTAL INTERPHALANGEAL JOINT FUSIONS;  Surgeon: Schuyler Amor, MD;  Location: [place];  Service: Orthopedics;  Laterality: Right;  . FINGER ARTHROPLASTY Right 04/07/2013   Procedure: RIGHT THUMB [place] ARTHROPLASTY;  Surgeon: Schuyler Amor, MD;  Location: [place];  Service: Orthopedics;  Laterality: Right;  . GANGLION CYST EXCISION Right 04/07/2013   Procedure: RIGHT WRIST MASS EXCISION;  Surgeon: Schuyler Amor, MD;  Location: [place];  Service: Orthopedics;  Laterality: Right;  . KNEE ARTHROSCOPY  LEFT X3 (LAST ONE 2005)  . KNEE ARTHROSCOPY  05/17/2011   Procedure: ARTHROSCOPY KNEE;  Surgeon: Bradley Ferris III;  Location: [place];  Service: Orthopedics;  Laterality: Right;  WITH MEDIAL MENISECTOMY AND removal of suprapatella fat lump  . LEFT WRIST TENOSYNECTOMY W/ LEFT THUMB JOINT REPAIR  02-24-10  . LUMBAR FUSION  11-30-10   L4 - 5  . LUMBAR FUSION  2000   L2 - 4   . LUMBAR LAMINECTOMY  DEC 2011   L4 - 5  . POSTERIOR CERVICAL FUSION/FORAMINOTOMY N/A 05/17/2017   Procedure: Cervical five  to Thorastic one  Posterior cervical fusion with lateral mass screws/revision of prior instrumentation;  Surgeon: Erline Levine, MD;  Location: [place];  Service: Neurosurgery;  Laterality: N/A;  C5 to T1 Posterior cervical fusion with lateral mass screws/revision of prior instrumentation  . TENDON REPAIR  JAN 2010   LEFT INDEX AND LONG FINGERS  . THUMB SURGERY   04/07/2019   RIGHT THUMB  . TUBAL LIGATION     Past Medical History:  Diagnosis Date  . Acute sinusitis, unspecified   .  Allergy   . Anemia    "quite a few times"  . Anxiety state, unspecified   . Arachnoiditis BILATERAL LEGS   DUE TO MULTIPLE BACK SURG.'S  . Arthritis   . Asthma    last flare up was 03/2017 lasted over a month  . Blood transfusion   . Cancer ([place])    skin cancers (in scalp)  . Cardiomyopathy HX --06/2010   EF was 25% during acute illness (PHELONEPHRITIS) Repeat echo 12-06-10 60% showed normal EF.   Marland Kitchen Chronic back pain greater than 3 months duration    S/P BACK SURG'S  . CSF leak   . Diabetes mellitus without complication ([place])    dx 2013 type 2  . Dyslipidemia   . Dysphagia    some post op cerv fusion 2/14  . Dysrhythmia   . Essential hypertension, benign   . Family history of adverse reaction to anesthesia    mother gets n/v  . GERD (gastroesophageal reflux disease) AND HIATIAL HERNIA   CONTROLLED W/ NEXIUM  . Headache(784.0)   . Heart murmur    DENIES S & S   (ECHO JUN'12 W/ CHART)  . History of chronic bronchitis   . Hx of bladder infections   . Hyperlipidemia    TAKE CHLOSTEROL MEDICATION,04/23/19  . Hypertension   . Murmur, heart   . Neuromuscular disorder (HCC)    numbness and tingling  . Osteoporosis   . Other malaise and fatigue   . PONV (postoperative nausea and vomiting)   . Shortness of breath   . Spinal headache   . Spinal stenosis, cervical region    . Varicosities    venous  . Weakness of both legs DUE TO ARACHNOIDITIS   OCCASIONAL USES CANE   BP 126/74   Pulse 92   Temp (!) 97.5 F (36.4 C)   Ht 5\' 5"  (1.651 m)   Wt 179 lb 3.2 oz (81.3 kg)   SpO2 96%   BMI 29.82 kg/m   Opioid Risk Score:   Fall Risk Score:  `1  Depression screen PHQ 2/9  Depression screen [facility] 2/9 10/13/2019 06/25/2019 10/30/2018 08/14/2018 03/12/2018 10/25/2017 09/27/2017  Decreased Interest 0 0 0 0 0 0 0  Down, Depressed, Hopeless 0 0 0 - 0 0 0  PHQ - 2 Score 0 0 0 0 0 0 0  Altered sleeping - - - - - - 0  Tired, decreased energy - - - - - - 0  Change in appetite - - - - - - 0  Feeling bad or failure about yourself  - - - - - - 0  Trouble concentrating - - - - - - 0  Moving slowly or fidgety/restless - - - - - - 0  Suicidal thoughts - - - - - - 0  PHQ-9 Score - - - - - - 0  Some recent data might be hidden   Review of Systems  Constitutional: Negative.   HENT: Negative.   Eyes: Negative.   Respiratory: Negative.   Cardiovascular: Negative.   Gastrointestinal: Negative.   Endocrine: Negative.   Genitourinary: Negative.   Musculoskeletal: Negative.   Skin: Negative.   Allergic/Immunologic: Negative.   Neurological: Negative.   Hematological: Negative.   Psychiatric/Behavioral: Negative.   All other systems reviewed and are negative.      Objective:   Physical Exam Vitals and nursing note reviewed.  Constitutional:      Appearance: Normal appearance.  Neck:     Comments:  Cervical Paraspinal Tenderness: C-5-C-6  Cardiovascular:     Rate and Rhythm: Normal rate and regular rhythm.     Pulses: Normal pulses.     Heart sounds: Normal heart sounds.  Pulmonary:     Effort: Pulmonary effort is normal.     Breath sounds: Normal breath sounds.  Musculoskeletal:     Cervical back: Normal range of motion and neck supple.     Comments: Normal Muscle Bulk and Muscle Testing Reveals:  Upper Extremities: Full ROM and Muscle Strength 5/5 Bilateral  AC Joint Tenderness  Thoracic Paraspinal Tenderness: T-7-T-9 Lumbar Hypersensitivity Lower Extremities: Full ROM and Muscle Strength 5/5 Arises from Chair slowly Narrow Based  Gait   Skin:    General: Skin is warm and dry.  Neurological:     Mental Status: She is alert and oriented to person, place, and time.  Psychiatric:        Mood and Affect: Mood normal.        Behavior: Behavior normal.           Assessment & Plan:  1. On 05/17/2017 :C5C6C7 T1 Posterior Cervical Fusion with lateral mass fixation with AIRO Imaging, revision of prior instrumentation. By Dr. Vertell Limber.  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 Continuecurrent medication regimen withGabapentin800 mgBID.Refilled:Oxycodone 10mg  one tablet every 6 hours as needed #120tabletsand Oxymorphone HCL 40 mg every 12 hours #60.10/13/2019 We will continue the opioid monitoring program, this consists of regular clinic visits, examinations, urine drug screen, pill counts as well as use of [place] Controlled Substance Reporting System. 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 Anxiety/depression: PCP Following. Continueto monitor.10/13/2019 4. Muscle Spasms: Continuecurrent medication regime withFlexeril. Continue to Monitor.10/13/2019 5. Cervicalgia/ Cervical Radiculitis: Continuecurrent medication regime withGabapentin: Dr. Vertell Limber Following: S/P C5C6C7 T1 Posterior Cervical Fusion with lateral mass fixation with AIRO Imaging, revision of prior instrumentation by Dr. Vertell Limber on 05/17/2017.10/13/2019 7. Bilateral Thoracic Back Pain:Continue HEP as Tolerated and Continue current medication regimen. Continue to Monitor.10/13/2019 8. Left lower extremity DVT/ Phlebitis: S/P endovenous laser ablation of left great saphenous vein on 05/09/2018 by Dr. Scot Dock:  Vascular Following.10/13/2019. 9. Insomnia:ContinuePamelor. Continue to Monitor.10/13/2019.   F/U in 1 month  15 minutes of face to face patient care time was spent during this visit. All questions were encouraged and answered.

## 2019-10-23 ENCOUNTER — Ambulatory Visit: Payer: Medicare Other | Admitting: Internal Medicine

## 2019-10-23 DIAGNOSIS — M65331 Trigger finger, right middle finger: Secondary | ICD-10-CM | POA: Diagnosis not present

## 2019-10-23 DIAGNOSIS — M19049 Primary osteoarthritis, unspecified hand: Secondary | ICD-10-CM | POA: Diagnosis not present

## 2019-10-23 DIAGNOSIS — M25731 Osteophyte, right wrist: Secondary | ICD-10-CM | POA: Diagnosis not present

## 2019-10-30 ENCOUNTER — Telehealth: Payer: Self-pay | Admitting: *Deleted

## 2019-10-30 NOTE — Telephone Encounter (Signed)
Prior authorization submitted via covermymeds for gabapentin.  Approved 07/31/2019 - 06/18/2020

## 2019-11-07 ENCOUNTER — Other Ambulatory Visit: Payer: Self-pay | Admitting: Registered Nurse

## 2019-11-07 DIAGNOSIS — M961 Postlaminectomy syndrome, not elsewhere classified: Secondary | ICD-10-CM

## 2019-11-07 DIAGNOSIS — G039 Meningitis, unspecified: Secondary | ICD-10-CM

## 2019-11-11 ENCOUNTER — Encounter: Payer: Self-pay | Admitting: Internal Medicine

## 2019-11-11 ENCOUNTER — Encounter: Payer: Self-pay | Admitting: Registered Nurse

## 2019-11-11 ENCOUNTER — Encounter: Payer: Medicare Other | Attending: Physical Medicine & Rehabilitation | Admitting: Registered Nurse

## 2019-11-11 ENCOUNTER — Other Ambulatory Visit: Payer: Self-pay

## 2019-11-11 ENCOUNTER — Ambulatory Visit (INDEPENDENT_AMBULATORY_CARE_PROVIDER_SITE_OTHER): Payer: Medicare Other | Admitting: Internal Medicine

## 2019-11-11 VITALS — BP 130/76 | HR 87 | Temp 98.1°F | Ht 65.0 in | Wt 175.8 lb

## 2019-11-11 VITALS — BP 113/74 | HR 87 | Temp 97.5°F | Ht 65.0 in | Wt 177.2 lb

## 2019-11-11 DIAGNOSIS — R053 Chronic cough: Secondary | ICD-10-CM

## 2019-11-11 DIAGNOSIS — M5416 Radiculopathy, lumbar region: Secondary | ICD-10-CM | POA: Diagnosis not present

## 2019-11-11 DIAGNOSIS — M25731 Osteophyte, right wrist: Secondary | ICD-10-CM | POA: Diagnosis not present

## 2019-11-11 DIAGNOSIS — G8929 Other chronic pain: Secondary | ICD-10-CM | POA: Diagnosis not present

## 2019-11-11 DIAGNOSIS — R05 Cough: Secondary | ICD-10-CM | POA: Diagnosis not present

## 2019-11-11 DIAGNOSIS — Z79891 Long term (current) use of opiate analgesic: Secondary | ICD-10-CM

## 2019-11-11 DIAGNOSIS — M25572 Pain in left ankle and joints of left foot: Secondary | ICD-10-CM | POA: Diagnosis not present

## 2019-11-11 DIAGNOSIS — M961 Postlaminectomy syndrome, not elsewhere classified: Secondary | ICD-10-CM

## 2019-11-11 DIAGNOSIS — Z5181 Encounter for therapeutic drug level monitoring: Secondary | ICD-10-CM

## 2019-11-11 DIAGNOSIS — M19049 Primary osteoarthritis, unspecified hand: Secondary | ICD-10-CM | POA: Diagnosis not present

## 2019-11-11 DIAGNOSIS — M7989 Other specified soft tissue disorders: Secondary | ICD-10-CM | POA: Diagnosis not present

## 2019-11-11 DIAGNOSIS — M5441 Lumbago with sciatica, right side: Secondary | ICD-10-CM | POA: Diagnosis not present

## 2019-11-11 DIAGNOSIS — Z7189 Other specified counseling: Secondary | ICD-10-CM | POA: Diagnosis not present

## 2019-11-11 DIAGNOSIS — M546 Pain in thoracic spine: Secondary | ICD-10-CM | POA: Diagnosis not present

## 2019-11-11 DIAGNOSIS — M5412 Radiculopathy, cervical region: Secondary | ICD-10-CM | POA: Diagnosis not present

## 2019-11-11 DIAGNOSIS — M542 Cervicalgia: Secondary | ICD-10-CM | POA: Diagnosis not present

## 2019-11-11 DIAGNOSIS — G894 Chronic pain syndrome: Secondary | ICD-10-CM | POA: Diagnosis not present

## 2019-11-11 DIAGNOSIS — J4540 Moderate persistent asthma, uncomplicated: Secondary | ICD-10-CM

## 2019-11-11 DIAGNOSIS — M180 Bilateral primary osteoarthritis of first carpometacarpal joints: Secondary | ICD-10-CM | POA: Diagnosis not present

## 2019-11-11 DIAGNOSIS — G039 Meningitis, unspecified: Secondary | ICD-10-CM | POA: Diagnosis not present

## 2019-11-11 DIAGNOSIS — J398 Other specified diseases of upper respiratory tract: Secondary | ICD-10-CM

## 2019-11-11 DIAGNOSIS — Z79899 Other long term (current) drug therapy: Secondary | ICD-10-CM | POA: Insufficient documentation

## 2019-11-11 DIAGNOSIS — M65331 Trigger finger, right middle finger: Secondary | ICD-10-CM | POA: Diagnosis not present

## 2019-11-11 DIAGNOSIS — Z7185 Encounter for immunization safety counseling: Secondary | ICD-10-CM

## 2019-11-11 MED ORDER — OXYMORPHONE HCL ER 40 MG PO T12A: 40.0000 mg | EXTENDED_RELEASE_TABLET | Freq: Two times a day (BID) | ORAL | 0 refills | Status: DC

## 2019-11-11 MED ORDER — ALBUTEROL SULFATE HFA 108 (90 BASE) MCG/ACT IN AERS
2.0000 | INHALATION_SPRAY | Freq: Four times a day (QID) | RESPIRATORY_TRACT | 1 refills | Status: DC | PRN
Start: 2019-11-11 — End: 2020-01-23

## 2019-11-11 MED ORDER — OXYCODONE HCL 10 MG PO TABS: 10.0000 mg | ORAL_TABLET | Freq: Four times a day (QID) | ORAL | 0 refills | Status: DC | PRN

## 2019-11-11 NOTE — Patient Instructions (Addendum)
  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 Cough Hoarseness of voic  - continue symbicort - change xopenex to albuterol inhaler 2 puff as needed -Stop Fosamax because this can be causing significant acid reflux and cough and contributing to hoarseness of voice  -Ask primary care physician for an alternative -Hold off on GI evaluation till we see how your symptoms are after stopping Fosamax -No indication for CT scan of the chest at this point [last one was October [id number]  -But can certainly get it if there is any concern at follow-up  Vaccine counseling  -Strongly recommend the benefits of COVID-19 vaccine is outweighing the risks  Followup 3-6 months or sooner -

## 2019-11-11 NOTE — Progress Notes (Signed)
Subjective:    Patient ID: [name] [name], female    DOB: Sep 25, 1950, 69 y.o.   MRN: [id number]  HPI: [name] [name] is a 69 y.o. female who returns for follow up appointment for chronic pain and medication refill. She states her pain is located in her neck radiating into her bilateral shoulders, mid- lower back pain radiating into her bilateral lower extremities and left ankle pain. Her left ankle pain being followed by her orthopedist, she reports. She rates her pain 7. Her current exercise regime is walking and performing house work.   Ms. Mckeough Morphine equivalent is 300.00 MME.  Last UDS was Performed on 09/15/2019, it was consistent.  Pain Inventory Average Pain 7 Pain Right Now 7 My pain is constant, sharp, burning, dull, stabbing and tingling  In the last 24 hours, has pain interfered with the following? General activity 7 Relation with others 7 Enjoyment of life 7 What TIME of day is your pain at its worst? all Sleep (in general) Fair  Pain is worse with: walking, bending, sitting, inactivity, standing and some activites Pain improves with: rest, heat/ice and medication Relief from Meds: 7  Mobility Do you have any goals in this area?  no  Function Do you have any goals in this area?  no  Neuro/Psych No problems in this area  Prior Studies Any changes since last visit?  no  Physicians involved in your care Any changes since last visit?  no   Family History  Problem Relation Age of Onset  . Hypertension Mother   . Heart disease Mother   . Diabetes Mother   . Cancer Father   . Diabetes Brother   . Colon cancer Neg Hx   . Colon polyps Neg Hx   . Esophageal cancer Neg Hx   . Rectal cancer Neg Hx   . Stomach cancer Neg Hx    Social History   Socioeconomic History  . Marital status: Divorced    Spouse name: Not on file  . Number of children: Not on file  . Years of education: Not on file  . Highest education level: Not on file  Occupational History  .  Occupation: disable    Employer: DISABLED  Tobacco Use  . Smoking status: Never Smoker  . Smokeless tobacco: Never Used  Substance and Sexual Activity  . Alcohol use: No  . Drug use: No  . Sexual activity: Not on file    Comment: second hand smoke  Other Topics Concern  . Not on file  Social History Narrative   Has one child   Disabled   Social Determinants of Health   Financial Resource Strain:   . Difficulty of Paying Living Expenses:   Food Insecurity:   . Worried About Charity fundraiser in the Last Year:   . Arboriculturist in the Last Year:   Transportation Needs:   . Film/video editor (Medical):   Marland Kitchen Lack of Transportation (Non-Medical):   Physical Activity:   . Days of Exercise per Week:   . Minutes of Exercise per Session:   Stress:   . Feeling of Stress :   Social Connections:   . Frequency of Communication with Friends and Family:   . Frequency of Social Gatherings with Friends and Family:   . Attends Religious Services:   . Active Member of Clubs or Organizations:   . Attends Archivist Meetings:   Marland Kitchen Marital Status:    Past Surgical  History:  Procedure Laterality Date  . ABDOMINAL HYSTERECTOMY  1987   W/ BSO  . ANTERIOR CERVICAL DECOMP/DISCECTOMY FUSION N/A 08/09/2012   Procedure: ANTERIOR CERVICAL DECOMPRESSION/DISCECTOMY FUSION 1 LEVEL;  Surgeon: Floyce Stakes, MD;  Location: [facility];  Service: Neurosurgery;  Laterality: N/A;  Cervical four-five  Anterior cervical decompression/diskectomy/fusion  . ANTERIOR FUSION CERVICAL SPINE  2007   C5 -6  . APPENDECTOMY    . APPLICATION OF INTRAOPERATIVE CT SCAN N/A 05/17/2017   Procedure: APPLICATION OF INTRAOPERATIVE CT SCAN;  Surgeon: Erline Levine, MD;  Location: [place];  Service: Neurosurgery;  Laterality: N/A;  . BACK SURGERY     x 5  . BREAST EXCISIONAL BIOPSY Bilateral    No scar seen   . BREAST SURGERY     x 2 biopsies  . CARPAL TUNNEL RELEASE  RIGHT - 2001  & DEC 2011 W/ BACK  SURG.  . CERVICAL DISC SURGERY  2005   C5 - 6  . CHOLECYSTECTOMY  1994  . DORSAL COMPARTMENT RELEASE Right 04/07/2013   Procedure: RIGHT WRIST STS RELEASE;  Surgeon: Schuyler Amor, MD;  Location: [place];  Service: Orthopedics;  Laterality: Right;  . ENDOVENOUS ABLATION SAPHENOUS VEIN W/ LASER Right 05/29/2019   endovenous laser ablation right greater saphenous vein by Gae Gallop MD   . [place] CSF LEAK  [place] 2011  X2   POST [place]  . FINGER ARTHRODESIS Right 04/07/2013   Procedure: RIGHT INDEX AND RIGHT LONG DISTAL INTERPHALANGEAL JOINT FUSIONS;  Surgeon: Schuyler Amor, MD;  Location: [place];  Service: Orthopedics;  Laterality: Right;  . FINGER ARTHROPLASTY Right 04/07/2013   Procedure: RIGHT THUMB [place] ARTHROPLASTY;  Surgeon: Schuyler Amor, MD;  Location: [place];  Service: Orthopedics;  Laterality: Right;  . GANGLION CYST EXCISION Right 04/07/2013   Procedure: RIGHT WRIST MASS EXCISION;  Surgeon: Schuyler Amor, MD;  Location: [place];  Service: Orthopedics;  Laterality: Right;  . KNEE ARTHROSCOPY  LEFT X3 (LAST ONE 2005)  . KNEE ARTHROSCOPY  05/17/2011   Procedure: ARTHROSCOPY KNEE;  Surgeon: Bradley Ferris III;  Location: [place];  Service: Orthopedics;  Laterality: Right;  WITH MEDIAL MENISECTOMY AND removal of suprapatella fat lump  . LEFT WRIST TENOSYNECTOMY W/ LEFT THUMB JOINT REPAIR  02-24-10  . LUMBAR FUSION  11-30-10   L4 - 5  . LUMBAR FUSION  2000   L2 - 4  . LUMBAR LAMINECTOMY  DEC 2011   L4 - 5  . POSTERIOR CERVICAL FUSION/FORAMINOTOMY N/A 05/17/2017   Procedure: Cervical five  to Thorastic one  Posterior cervical fusion with lateral mass screws/revision of prior instrumentation;  Surgeon: Erline Levine, MD;  Location: [place];  Service: Neurosurgery;  Laterality: N/A;  C5 to T1 Posterior cervical fusion with lateral mass screws/revision of  prior instrumentation  . TENDON REPAIR  JAN 2010   LEFT INDEX AND LONG FINGERS  . THUMB SURGERY   04/07/2019   RIGHT THUMB  . TUBAL LIGATION     Past Medical History:  Diagnosis Date  . Acute sinusitis, unspecified   . Allergy   . Anemia    "quite a few times"  . Anxiety state, unspecified   . Arachnoiditis BILATERAL LEGS   DUE TO MULTIPLE BACK SURG.'S  . Arthritis   . Asthma    last flare up was 03/2017 lasted over a month  . Blood transfusion   .  Cancer ([place])    skin cancers (in scalp)  . Cardiomyopathy HX --06/2010   EF was 25% during acute illness (PHELONEPHRITIS) Repeat echo 12-06-10 60% showed normal EF.   Marland Kitchen Chronic back pain greater than 3 months duration    S/P BACK SURG'S  . CSF leak   . Diabetes mellitus without complication ([place])    dx 2013 type 2  . Dyslipidemia   . Dysphagia    some post op cerv fusion 2/14  . Dysrhythmia   . Essential hypertension, benign   . Family history of adverse reaction to anesthesia    mother gets n/v  . GERD (gastroesophageal reflux disease) AND HIATIAL HERNIA   CONTROLLED W/ NEXIUM  . Headache(784.0)   . Heart murmur    DENIES S & S   (ECHO JUN'12 W/ CHART)  . History of chronic bronchitis   . Hx of bladder infections   . Hyperlipidemia    TAKE CHLOSTEROL MEDICATION,04/23/19  . Hypertension   . Murmur, heart   . Neuromuscular disorder (HCC)    numbness and tingling  . Osteoporosis   . Other malaise and fatigue   . PONV (postoperative nausea and vomiting)   . Shortness of breath   . Spinal headache   . Spinal stenosis, cervical region   . Varicosities    venous  . Weakness of both legs DUE TO ARACHNOIDITIS   OCCASIONAL USES CANE   BP 113/74   Pulse 87   Temp (!) 97.5 F (36.4 C)   Ht 5\' 5"  (1.651 m)   Wt 177 lb 3.2 oz (80.4 kg)   SpO2 98%   BMI 29.49 kg/m   Opioid Risk Score:   Fall Risk Score:  `1  Depression screen PHQ 2/9  Depression screen [facility] 2/9 10/13/2019 06/25/2019 10/30/2018 08/14/2018 03/12/2018  10/25/2017 09/27/2017  Decreased Interest 0 0 0 0 0 0 0  Down, Depressed, Hopeless 0 0 0 - 0 0 0  PHQ - 2 Score 0 0 0 0 0 0 0  Altered sleeping - - - - - - 0  Tired, decreased energy - - - - - - 0  Change in appetite - - - - - - 0  Feeling bad or failure about yourself  - - - - - - 0  Trouble concentrating - - - - - - 0  Moving slowly or fidgety/restless - - - - - - 0  Suicidal thoughts - - - - - - 0  PHQ-9 Score - - - - - - 0  Some recent data might be hidden    Review of Systems  Constitutional: Negative.   HENT: Negative.   Eyes: Negative.   Respiratory: Negative.   Cardiovascular: Negative.   Gastrointestinal: Negative.   Endocrine: Negative.   Genitourinary: Negative.   Musculoskeletal: Positive for arthralgias, back pain, gait problem, myalgias and neck pain.  Skin: Negative.   Allergic/Immunologic: Negative.   Hematological: Negative.   Psychiatric/Behavioral: Negative.   All other systems reviewed and are negative.      Objective:   Physical Exam Vitals and nursing note reviewed.  Constitutional:      Appearance: Normal appearance.  Neck:     Comments: Cervical Paraspinal Tenderness: C-5-C-6 Cardiovascular:     Rate and Rhythm: Normal rate and regular rhythm.     Pulses: Normal pulses.     Heart sounds: Normal heart sounds.  Pulmonary:     Effort: Pulmonary effort is normal.     Breath sounds:  Normal breath sounds.  Musculoskeletal:     Cervical back: Normal range of motion and neck supple.     Comments: Normal Muscle Bulk and Muscle Testing Reveals:  Upper Extremities: Full ROM and Muscle Strength 5/5 Bilateral AC Joint Tenderness  Thoracic Paraspinal Tenderness: T-9-T-11 Lumbar Paraspinal Tenderness: L-3-L-5  Lower Extremities: Full ROM and Muscle Strength  5/5 Arises from Table with ease Narrow Based  Gait   Skin:    General: Skin is warm and dry.  Neurological:     Mental Status: She is alert and oriented to person, place, and time.  Psychiatric:         Mood and Affect: Mood normal.        Behavior: Behavior normal.           Assessment & Plan:  1. On 05/17/2017 :C5C6C7 T1 Posterior Cervical Fusion with lateral mass fixation with AIRO Imaging, revision of prior instrumentation. By Dr. Vertell Limber.  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 Continuecurrent medication regimen withGabapentin800 mgBID.Refilled:Oxycodone 10mg  one tablet every 6 hours as needed #120tabletsand Oxymorphone HCL 40 mg every 12 hours #60.11/11/2019 We will continue the opioid monitoring program, this consists of regular clinic visits, examinations, urine drug screen, pill counts as well as use of [place] Controlled Substance Reporting System. 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 Anxiety/depression: PCP Following. Continueto monitor.11/11/2019 4. Muscle Spasms: Continuecurrent medication regime withFlexeril. Continue to Monitor.11/11/2019 5. Cervicalgia/ Cervical Radiculitis: Continuecurrent medication regime withGabapentin: Dr. Vertell Limber Following: S/P C5C6C7 T1 Posterior Cervical Fusion with lateral mass fixation with AIRO Imaging, revision of prior instrumentation by Dr. Vertell Limber on 05/17/2017.11/11/2019 7. Bilateral Thoracic Back Pain:Continue HEP as Tolerated and Continue current medication regimen. Continue to Monitor.11/11/2019 8. Left lower extremity DVT/ Phlebitis: S/P endovenous laser ablation of left great saphenous vein on 05/09/2018 by Dr. Scot Dock: Vascular Following.11/11/2019. 9. Insomnia:ContinuePamelor. Continue to Monitor.11/11/2019. 10. Acute Left Ankle Pain: Ortho Following. Continue to monitor.  F/U in 1 month  56minutes of face to face patient care time was spent during this visit. All questions were encouraged and answered.

## 2019-11-11 NOTE — Progress Notes (Signed)
OV 04/18/2016  Chief Complaint  Patient presents with  . Follow-up    Pt states voice is raspy. Pt c/o of some congestion more in morning, no tightness, and SOB w/ and w/o excertions. Pt states cough comes and goes.     FU asthma - MCT positive - on symbicort FU 72mm lung nodules - passive smoking Chronic voice hoarseness nos  Seeing afer 1 year. In interim has seen others. [place] at beach s/p syncope and near cardiac arrest due to dehydratiin per her hx. Since then not the same. Last week having right infrascapular pain that gets worse when she lifts her hand. Also last week or so increased hoarseness of voice is increased cough and wheezing production.She is demanding to have arepeat CT scan of chest for her 83mm nodules.She is extremely worried about cancer.   OV 02/27/2017  Chief Complaint  Patient presents with  . Follow-up    6 month f/u, moderate asthma, been doing ok but started wheezing at night when she lays down, allergies causing congestion    Follow-up asthma based on methacholine challenge test positivity and chronic hoarseness of voice  Last seen October 2017. She says for the last 7-8 months she's been having nocturnal wheezing that is waking up at night. But in the daytime she does not have any wheezing. She is also also having dysphagia that is chronic. She was referred to ENT particularly in the context of having to have C-spine surgery. Apparently her  Vocal cords were inflamed and he was then asked to take double dose of Protonix which seemed to help the cough but not fully. This is frustrating her. She struggling with understanding the pathophysiology of this. She's taking Symbicort and singulair regularly. She has lost 15 pounds of weight in the last few weeks intentionally according to her and so she does not think acid reflux is a problem.    asthma control questionnaire shows that at night she is waking up a few times because of perceived asthma symptoms.  When she wakes up she is asymptomatic and she is only very slightly limited in her activities by asthma. She is only experiencing very little shortness of breath because of asthma. She is wheezing only a little of the time at night but is using a lot of albuterol for rescue. 5 point average score is 1  feno is 25 ppb and normal 02/27/2017  She is asking for =- new issue of pre op clearance prior to C spine surgery next month   OV 08/30/2017   Chief Complaint  Patient presents with  . Follow-up    Pt states she was in the hospital February 13 in [place] due to pna.  Pt does have current complaints of cough, fatigue, hoarseness, and chest tightness. States she is unsure if she is fully over the pna.      Follow-up asthma based on methacholine challenge test positivity and chronic hoarseness of voice  69 year old female follow-up asthma based on methacholine challenge test and chronic hoarseness of voice.  Last seen fall 2018.  After that she tells me that in February 2019 she got hospitalized after suddenly taking ill.  She shows a blood gas results from the outside showing hypoxemia without hypercapnia.  She was not intubated or treated with BiPAP.  She was treated in the medical floor with oxygen and antibiotics.  She says she had hospital-acquired delirium and was diagnosed with right upper lobe pneumonia.  She was then  discharged after a few days.  Since then she is improving slowly but having significant fatigue review of the lab results from [facility] also shows anemia but otherwise chemistries were normal.  I do not have a chest x-ray for my visualization.  Now she tells me that despite being on inhalers for the last 2 days she is having worsening cough that is dry with white mucus no wheezing no orthopnea no proximal nocturnal dyspnea no fever.  She is worried about the cough.     Walking desaturation test on 08/30/2017 185 feet x 3 laps on ROOM AIR:  did not desaturate. Waklked at slow  moderate pace. Rest pulse ox was 99%, final pulse ox was 94%. HR response 96/min at rest to 101/min at peak exertion. Patient [name] [name]  Did not Desaturate < 88% . [name] [name] yes did  Desaturated </= 3% points. [name] [name] yes did get tachyardic. Got fatigued but only mildly     OV 03/28/2018  Subjective:  Patient ID: [name] [name], female , DOB: Feb 23, 1951 , age 43 y.o. , MRN: [id number] , ADDRESS: [place] [id number]   03/28/2018 -   Chief Complaint  Patient presents with  . Follow-up    still not feeling well- retaining fluid in leg - refused weight    Follow-up asthma based on methacholine challenge test positivity and chronic hoarseness of voice  HPI [name] [name] 69 y.o. -presents for routine follow-up.  She tells me that through the summer 2019 she has had 2 course of antibiotics and through a full course of prednisone for respiratory exacerbation.  She says she has chronic shortness of breath with exertion relieved by rest without associated chest pain.  She feels that the course is been progressive.  Moderate in intensity occasionally severe.  In addition she has nocturnal wheezing that is waking her up.  This despite using Xopenex and Entocort which seemed to help her.  In addition for the last 8 months or so she is reporting left lower extremity edema that is worse than the right side.  She has tried compression stockings without relief.  This is new report to me.  She says primary care is not referring her to cardiology.  She thinks that overall she is volume overloaded because of the symptom.  She had a stress test several years ago that was normal.  In addition is also reporting chronic hoarseness of voice that is worse.  Last visit with ENT was with Dr. Redmond Baseman approximately 1 year ago.  She still has not followed up with him.  Exam nitric oxide today is 13 ppb and normal asthma control questionnaire with significant amount of symptomatology.  ROS - per  HPI     OV 07/18/2018  Subjective:  Patient ID: [name] [name], female , DOB: 05-27-1951 , age 47 y.o. , MRN: [id number] , ADDRESS: [address] [place] [address]   07/18/2018 -   Chief Complaint  Patient presents with  . Follow-up    Pt states she is still coughing with thick phlegm and is also hoarse due to her husband walking past her smoking a cigarette. Pt also states she is fatigued, dizzy, and has lost her taste and due to that is not eating that much. Pt states she has to use her xopenex inhaler about 4 times daily.   Follow-up asthma based on methacholine challenge test positivity and tracheobronchomalacia on CT OCt 2019 Fpollowup chronic hoarseness of  voice Foollowup chronic benign nodule oct 2019  HPI [name] [name] 69 y.o. -returns for follow-up.  Last seen by myself in October 2019.  After that she has had 3 visits with our nurse practitioners.  Each time for worsening respiratory symptoms and hoarseness of voice.  Each time getting prednisone.  Today she returns and has significant amount of respiratory symptoms.  However she is categorical that these are not changed in the last few weeks of few days in several days.  She says they are all stable.  But the symptom level burden is extremely high.  She is waking up several times at night.  When she wakes up she has mild symptoms.  She is moderately limited in her activities.  She has a little shortness of breath.  Wheezing moderate amount of the time and using albuterol for rescue multiple times daily.  This is despite being on chronic stable inhalers.  Of note, her CT scan in October 2019 showed tracheobronchomalacia.  So we sent her back to Dr. Redmond Baseman in ENT.  She says she has seen him.  I do not have the report with me.  Apparently the vocal cords were red.  Only supportive care advised.  In addition now she is having a new complaint of chronic dysphagia for the last 1 year.  And that she get stuck with mashed potato.  Her  last GI evaluation was 10 years ago in [place].  At that time she did not have these current problems.  She is willing to see GI at this point.  She is also open to having a tertiary care referral.     Results for RUTHANNA, CLARIN (MRN [id number])  Ref. Range 12/24/2014 16:53 08/30/2017  07/18/2018   Nitric Oxide Unknown 27 Could not perform 14 ppb   IMPRESSION: 1. No findings to suggest interstitial lung disease. 2. Moderate air trapping indicative of small airways disease. 3. 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 4. Innumerable tiny 1-2 mm pulmonary nodules scattered throughout the lungs bilaterally, stable dating back to 2017, considered definitively benign. 5. Hepatic steatosis.   Electronically Signed   By: Vinnie Langton M.D.   On: 04/12/2018 11:09  ROS - per HPI   OV 04/01/2019  Subjective:  Patient ID: [name] [name], female , DOB: 09/22/1950 , age 61 y.o. , MRN: [id number] , ADDRESS: [place] [id number]   04/01/2019 -   Chief Complaint  Patient presents with  . Follow-up    Pt states overall she feels her breathing is unchanged since last OV in 12/2018. However, pt c/o desats to high 80's when lyning down for bed, pt doesnt lay flat. Pt also c/o daily nausea and a couple episodes of vomiting in the last 2 weeks. Pt states she has lost 6 lbs over the last couple weeks. Pt denies cough, CP/tightness and f/c/s.    Follow-up asthma based on methacholine challenge test positivity and tracheobronchomalacia on CT OCt 2019 Fpollowup chronic hoarseness of voice with associated red vocal cords per history of examination by Dr. Redmond Baseman ENT 2019 Foollowup chronic benign nodule oct 2019  HPI [name] [name] 69 y.o. -returns for follow-up.  Last seen January 2020 and then lost to follow-up because of the pandemic.  In the interim in June 2020 she got admitted for hypercapnia at [facility].  She was discharged a day later.  Never got intubated.  Apparently  she responded to Narcan.  Heavy pain medication opioid use was documented but she  says this was not a overdose admission.  She refuses to have blood gas test again.  Currently overall she is feeling well.  However she is lost 6 pounds of weight unintentionally.  And she is having intermittent nausea and vomiting of moderate intensity.  Review of medication shows polypharmacy associated with multiple opioid medications but she does not think this is the reason.  She is worried about malignancy.  She is asking for a CT scan of the chest for GI symptoms.  Most recent CT scan of the chest was in October 2019 and other than chronic stable benign nodules no ILD or other findings.  Of note she is also having dysphagia that is chronic.  I referred her to GI in early 2020 but because of the pandemic this did not happen.  She is willing to get referred to GI again.  She also thinks her pulse ox drops at night.  07/29/2019 Follow up: Asthma  Patient presents for a 1 month follow-up.  Patient was seen last visit for an asthmatic bronchitic exacerbation.  She was treated with Augmentin and a prednisone taper.  Her Symbicort was increased from 80-160.  Patient says she is starting to feel better.  She has decreased cough and wheezing.  Unfortunately she did not pick up her Symbicort because pharmacist did not have it.  Patient denies any chest pain orthopnea PND or increased leg swelling.   ROS - per HPI   OV 11/11/2019  Subjective:  Patient ID: [name] [name], female , DOB: Apr 27, 1951 , age 68 y.o. , MRN: [id number] , ADDRESS: [address] [address]   Follow-up asthma based on methacholine challenge test positivity and tracheobronchomalacia on CT OCt 2019 Fpollowup chronic hoarseness of voice with associated red vocal cords per history of examination by Dr. Redmond Baseman ENT 2019 Foollowup chronic benign nodule oct 2019 -3 mm Follow-up chronic cough  11/11/2019 -   Chief Complaint  Patient presents with  .  Follow-up     HPI [name] [name] 69 y.o. -returns for follow-up.  Last seen by myself in October 2020 last seen by nurse practitioner February 2021.  Nurse practitioner picked up on the fact she was on Fosamax.  She asked the patient to stop it but patient has not stopped it because of fear of osteoporosis.  She says she tried alternative Boniva but does not know why this was changed to Fosamax.  She is willing to try another alternative because of acid reflux.  Last visit I referred her to GI but she no showed with Dr. Lucio Edward but she says this was because of personal issues with Dr. Fuller Plan will not follow her anymore.  She says at this point she just wants to try stopping the Fosamax and seeing what her symptoms do.  She says she continues to have significant acid reflux and cough and hoarseness.  In the last 1 month she thinks asthma is affected a little of the time and she is been short of breath once or twice a week.  She is also used her rescue nebulizer inhaler 2-3 times per week but she believes her asthma to be well controlled.  She is some amount of social stress because her mom is ill and she is having to take her mom to a different doctor visits.  Also she wants switch from her Xopenex to albuterol inhaler because of insurance reasons.  She wants to have a repeat CT scan of the chest if her  GI symptoms have not resolved especially her cough particularly after stopping the Fosamax.    After completing the office visit.  She call me back again.  She had several questions about Covid vaccine.  She is scared to get it because of the side effects.  She says all her doctors have recommended she get it.  She said previous vaccines without any problems.  She does not have any allergy to injectables or vaccines.  She wanted to know the mechanism of action and the ingredients in the vaccine.    ROS - per HPI     has a past medical history of Acute sinusitis, unspecified, Allergy, Anemia,  Anxiety state, unspecified, Arachnoiditis (BILATERAL LEGS), Arthritis, Asthma, Blood transfusion, Cancer ([place]), Cardiomyopathy (HX --06/2010), Chronic back pain greater than 3 months duration, CSF leak, Diabetes mellitus without complication ([place]), Dyslipidemia, Dysphagia, Dysrhythmia, Essential hypertension, benign, Family history of adverse reaction to anesthesia, GERD (gastroesophageal reflux disease) (AND HIATIAL HERNIA), Headache(784.0), Heart murmur, History of chronic bronchitis, bladder infections, Hyperlipidemia, Hypertension, Murmur, heart, Neuromuscular disorder ([place]), Osteoporosis, Other malaise and fatigue, PONV (postoperative nausea and vomiting), Shortness of breath, Spinal headache, Spinal stenosis, cervical region, Varicosities, and Weakness of both legs (DUE TO ARACHNOIDITIS).   reports that she has never smoked. She has never used smokeless tobacco.  Past Surgical History:  Procedure Laterality Date  . ABDOMINAL HYSTERECTOMY  1987   W/ BSO  . ANTERIOR CERVICAL DECOMP/DISCECTOMY FUSION N/A 08/09/2012   Procedure: ANTERIOR CERVICAL DECOMPRESSION/DISCECTOMY FUSION 1 LEVEL;  Surgeon: Floyce Stakes, MD;  Location: [facility];  Service: Neurosurgery;  Laterality: N/A;  Cervical four-five  Anterior cervical decompression/diskectomy/fusion  . ANTERIOR FUSION CERVICAL SPINE  2007   C5 -6  . APPENDECTOMY    . APPLICATION OF INTRAOPERATIVE CT SCAN N/A 05/17/2017   Procedure: APPLICATION OF INTRAOPERATIVE CT SCAN;  Surgeon: Erline Levine, MD;  Location: [place];  Service: Neurosurgery;  Laterality: N/A;  . BACK SURGERY     x 5  . BREAST EXCISIONAL BIOPSY Bilateral    No scar seen   . BREAST SURGERY     x 2 biopsies  . CARPAL TUNNEL RELEASE  RIGHT - 2001  & DEC 2011 W/ BACK SURG.  . CERVICAL DISC SURGERY  2005   C5 - 6  . CHOLECYSTECTOMY  1994  . DORSAL COMPARTMENT RELEASE Right 04/07/2013   Procedure: RIGHT WRIST STS RELEASE;  Surgeon: Schuyler Amor, MD;  Location: [facility];  Service: Orthopedics;  Laterality: Right;  . ENDOVENOUS ABLATION SAPHENOUS VEIN W/ LASER Right 05/29/2019   endovenous laser ablation right greater saphenous vein by Gae Gallop MD   . [place] CSF LEAK  [place] 2011  X2   POST [place]  . FINGER ARTHRODESIS Right 04/07/2013   Procedure: RIGHT INDEX AND RIGHT LONG DISTAL INTERPHALANGEAL JOINT FUSIONS;  Surgeon: Schuyler Amor, MD;  Location: [facility];  Service: Orthopedics;  Laterality: Right;  . FINGER ARTHROPLASTY Right 04/07/2013   Procedure: RIGHT THUMB [place] ARTHROPLASTY;  Surgeon: Schuyler Amor, MD;  Location: [facility];  Service: Orthopedics;  Laterality: Right;  . GANGLION CYST EXCISION Right 04/07/2013   Procedure: RIGHT WRIST MASS EXCISION;  Surgeon: Schuyler Amor, MD;  Location: [facility];  Service: Orthopedics;  Laterality: Right;  . KNEE ARTHROSCOPY  LEFT X3 (LAST ONE 2005)  . KNEE ARTHROSCOPY  05/17/2011   Procedure: ARTHROSCOPY KNEE;  Surgeon: Karen Chafe Rendall III;  Location:  [facility];  Service: Orthopedics;  Laterality: Right;  WITH MEDIAL MENISECTOMY AND removal of suprapatella fat lump  . LEFT WRIST TENOSYNECTOMY W/ LEFT THUMB JOINT REPAIR  02-24-10  . LUMBAR FUSION  11-30-10   L4 - 5  . LUMBAR FUSION  2000   L2 - 4  . LUMBAR LAMINECTOMY  DEC 2011   L4 - 5  . POSTERIOR CERVICAL FUSION/FORAMINOTOMY N/A 05/17/2017   Procedure: Cervical five  to Thorastic one  Posterior cervical fusion with lateral mass screws/revision of prior instrumentation;  Surgeon: Erline Levine, MD;  Location: [place];  Service: Neurosurgery;  Laterality: N/A;  C5 to T1 Posterior cervical fusion with lateral mass screws/revision of prior instrumentation  . TENDON REPAIR  JAN 2010   LEFT INDEX AND LONG FINGERS  . THUMB SURGERY   04/07/2019   RIGHT THUMB  . TUBAL LIGATION      Allergies  Allergen Reactions  . Latex Itching and Rash  .  Morphine And Related Hives and Itching  . Aspirin Nausea And Vomiting    Can take coated asa  . Biaxin [Clarithromycin] Nausea And Vomiting  . Clarithromycin Diarrhea  . Codeine Itching  . Darvocet [Propoxyphene N-Acetaminophen] Itching  . Hydrocodone-Acetaminophen Itching  . Lortab [Hydrocodone-Acetaminophen] Itching  . Percocet [Oxycodone-Acetaminophen] Itching  . Tramadol Nausea Only    Immunization History  Administered Date(s) Administered  . Fluad Quad(high Dose 65+) 04/01/2019  . Influenza Split 04/19/2013, 03/18/2014  . Influenza, High Dose Seasonal PF 04/25/2018  . Influenza,inj,Quad PF,6+ Mos 05/04/2015, 02/27/2017  . Pneumococcal Polysaccharide-23 04/19/2013  . Zoster 05/30/2013    Family History  Problem Relation Age of Onset  . Hypertension Mother   . Heart disease Mother   . Diabetes Mother   . Cancer Father   . Diabetes Brother   . Colon cancer Neg Hx   . Colon polyps Neg Hx   . Esophageal cancer Neg Hx   . Rectal cancer Neg Hx   . Stomach cancer Neg Hx      Current Outpatient Medications:  .  alendronate (FOSAMAX) 70 MG tablet, Take 70 mg by mouth every Wednesday. Take with a full glass of water on an empty stomach. , Disp: , Rfl:  .  amLODipine (NORVASC) 5 MG tablet, Take 5 mg by mouth daily., Disp: , Rfl:  .  Ascorbic Acid (VITAMIN C) 1000 MG tablet, Take 1,000 mg by mouth daily., Disp: , Rfl:  .  aspirin EC 81 MG tablet, Take 81 mg by mouth at bedtime. , Disp: , Rfl:  .  budesonide-formoterol (SYMBICORT) 160-4.5 MCG/ACT inhaler, Inhale 2 puffs into the lungs 2 (two) times daily., Disp: 1 Inhaler, Rfl: 5 .  Calcium Carbonate-Vit D-Min 1200-1000 MG-UNIT CHEW, Chew 1 tablet by mouth daily. , Disp: , Rfl:  .  ciprofloxacin (CIPRO) 500 MG tablet, Take 500 mg by mouth 2 (two) times daily., Disp: , Rfl:  .  diltiazem (CARDIZEM CD) 240 MG 24 hr capsule, Take 240 mg by mouth daily., Disp: , Rfl:  .  estradiol (ESTRACE) 2 MG tablet, Take 2 mg by mouth daily. ,  Disp: , Rfl:  .  gabapentin (NEURONTIN) 800 MG tablet, Take 1 tablet (800 mg total) by mouth 4 (four) times daily., Disp: 120 tablet, Rfl: 5 .  levalbuterol (XOPENEX HFA) 45 MCG/ACT inhaler, Inhale 1-2 puffs into the lungs every 4 (four) hours as needed. For shortness of breath, Disp: 1 Inhaler, Rfl: 6 .  losartan-hydrochlorothiazide (HYZAAR) 50-12.5 MG tablet, Take  1 tablet by mouth daily., Disp: , Rfl:  .  metFORMIN (GLUCOPHAGE) 500 MG tablet, Take 500 mg by mouth 2 (two) times daily with a meal. , Disp: , Rfl:  .  montelukast (SINGULAIR) 10 MG tablet, Take 10 mg by mouth at bedtime., Disp: , Rfl:  .  naloxone (NARCAN) nasal spray 4 mg/0.1 mL, Place 1 spray into the nose once as needed (Just in case of Opiod Overdose)., Disp: , Rfl:  .  nortriptyline (PAMELOR) 25 MG capsule, Take 1 capsule (25 mg total) by mouth at bedtime., Disp: 30 capsule, Rfl: 2 .  omeprazole (PRILOSEC) 20 MG capsule, Take 20 mg by mouth daily., Disp: , Rfl:  .  Oxycodone HCl 10 MG TABS, Take 1 tablet (10 mg total) by mouth 4 (four) times daily as needed., Disp: 120 tablet, Rfl: 0 .  Oxymorphone HCl, Crush Resist, 40 MG PO T12A, Take 40 mg by mouth 2 (two) times daily., Disp: 60 tablet, Rfl: 0 .  promethazine (PHENERGAN) 25 MG tablet, , Disp: , Rfl:  .  simvastatin (ZOCOR) 10 MG tablet, Take 10 mg by mouth daily., Disp: , Rfl:  .  valACYclovir (VALTREX) 500 MG tablet, Take 500 mg by mouth daily. , Disp: , Rfl:  No current facility-administered medications for this visit.  Facility-Administered Medications Ordered in Other Visits:  .  levalbuterol (XOPENEX) nebulizer solution 0.63 mg, 0.63 mg, Nebulization, Once, Parrett, Tammy S, NP      Objective:   Vitals:   11/11/19 1039  BP: 130/76  Pulse: 87  Temp: 98.1 F (36.7 C)  TempSrc: Oral  SpO2: 98%  Weight: 175 lb 12.8 oz (79.7 kg)  Height: 5\' 5"  (1.651 m)    Estimated body mass index is 29.25 kg/m as calculated from the following:   Height as of this  encounter: 5\' 5"  (1.651 m).   Weight as of this encounter: 175 lb 12.8 oz (79.7 kg).  @WEIGHTCHANGE @  [employer]   11/11/19 1039  Weight: 175 lb 12.8 oz (79.7 kg)     Physical Exam  General Appearance:    Alert, cooperative, no distress, appears stated age - yes , Deconditioned looking - no , OBESE  - no, Sitting on Wheelchair -  no  Head:    Normocephalic, without obvious abnormality, atraumatic  Eyes:    PERRL, conjunctiva/corneas clear,  Ears:    Normal TM's and external ear canals, both ears  Nose:   Nares normal, septum midline, mucosa normal, no drainage    or sinus tenderness. OXYGEN ON  - no . Patient is @ ra   Throat:   Lips, mucosa, and tongue normal; teeth and gums normal. Cyanosis on lips - no  Neck:   Supple, symmetrical, trachea midline, no adenopathy;    thyroid:  no enlargement/tenderness/nodules; no carotid   bruit or JVD  Back:     Symmetric, no curvature, ROM normal, no CVA tenderness  Lungs:     Distress - no , Wheeze no, Barrell Chest - no, Purse lip breathing - no, Crackles - no   Chest Wall:    No tenderness or deformity.    Heart:    Regular rate and rhythm, S1 and S2 normal, no rub   or gallop, Murmur - no  Breast Exam:    NOT DONE  Abdomen:     Soft, non-tender, bowel sounds active all four quadrants,    no masses, no organomegaly. Visceral obesity - yes  Genitalia:   NOT DONE  Rectal:   NOT DONE  Extremities:   Extremities - normal, Has Cane - no, Clubbing - no, Edema - no  Pulses:   2+ and symmetric all extremities  Skin:   Stigmata of Connective Tissue Disease - no  Lymph nodes:   Cervical, supraclavicular, and axillary nodes normal  Psychiatric:  Neurologic:   Pleasant - yes, Anxious - no, Flat affect - yes  CAm-ICU - neg, Alert and Oriented x 3 - yes, Moves all 4s - yes, Speech - normal, Cognition - intact           Assessment:       ICD-10-CM   1. Chronic cough  R05   2. Tracheobronchomalacia  J39.8   3. Moderate persistent asthma  without complication  123456   4. Vaccine counseling  Z71.89        Plan:     Patient Instructions   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 Cough Hoarseness of voic  - continue symbicort - change xopenex to albuterol inhaler 2 puff as needed -Stop Fosamax because this can be causing significant acid reflux and cough and contributing to hoarseness of voice  -Ask primary care physician for an alternative -Hold off on GI evaluation till we see how your symptoms are after stopping Fosamax -No indication for CT scan of the chest at this point [last one was October [id number]  -But can certainly get it if there is any concern at follow-up  Vaccine counseling  -Strongly recommend the benefits of COVID-19 vaccine is outweighing the risks  Followup 3-6 months or sooner -       SIGNATURE    Dr. Brand Males, M.D., F.C.C.P,  Pulmonary and Critical Care Medicine Staff Physician, [place] Director - Interstitial Lung Disease  Program  Pulmonary [place] at [place], [place], [address]  Pager: [phone], If no answer or between  15:00h - 7:00h: call [phone] Telephone: [phone]  11:31 AM 11/11/2019

## 2019-11-12 ENCOUNTER — Ambulatory Visit (INDEPENDENT_AMBULATORY_CARE_PROVIDER_SITE_OTHER): Payer: Medicare Other | Admitting: Family

## 2019-11-12 ENCOUNTER — Encounter: Payer: Self-pay | Admitting: Family

## 2019-11-12 ENCOUNTER — Ambulatory Visit (INDEPENDENT_AMBULATORY_CARE_PROVIDER_SITE_OTHER): Payer: Medicare Other

## 2019-11-12 VITALS — Ht 65.0 in | Wt 177.0 lb

## 2019-11-12 DIAGNOSIS — M79672 Pain in left foot: Secondary | ICD-10-CM | POA: Diagnosis not present

## 2019-11-12 NOTE — Progress Notes (Signed)
Office Visit Note   Patient: [name] [name]           Date of Birth: [birth year]           MRN: [id number] Visit Date: 11/12/2019              Requested by: Neale Burly, MD [place],  [place] [id number] PCP: Neale Burly, MD  Chief Complaint  Patient presents with  . Left Foot - Pain      HPI: The patient is a 69 year old woman seen today for evaluation of left foot and ankle pain.  She states this has been going on for about a week she cannot recall any injury that she has had.  Has had ankle sprains in the past.  States this feels different.  States she has had issues with brittle bones feet, but they are soft.  She is pointing to the outside of her ankle as well as the malleolus.  Complaining of some associated swelling  Assessment & Plan: Visit Diagnoses:  1. Pain in left foot     Plan: We will place her in a cam walker to shorten ankle motion.  She will wear this for the next 2 weeks.  She may remove this for range of motion sleeping and hygiene.  She will wear it with weightbearing.  Discussed taking anti-inflammatories, she states she is unable to take these.  Follow-Up Instructions: No follow-ups on file.   Right Ankle Exam   Tenderness  The patient is experiencing tenderness in the ATF. Swelling: moderate  Range of Motion  The patient has normal right ankle ROM.  Muscle Strength  The patient has normal right ankle strength.  Tests  Anterior drawer: negative Varus tilt: positive  Other  Erythema: absent Sensation: normal Pulse: present       Patient is alert, oriented, no adenopathy, well-dressed, normal affect, normal respiratory effort.   Imaging: No results found. No images are attached to the encounter.  Labs: Lab Results  Component Value Date   HGBA1C 5.8 (H) 04/19/2017   REPTSTATUS 08/11/2012 FINAL 08/10/2012   GRAMSTAIN  06/10/2010    ABUNDANT WBC PRESENT,BOTH PMN AND MONONUCLEAR NO ORGANISMS SEEN Performed at  [facility]  06/10/2010    ABUNDANT WBC PRESENT,BOTH PMN AND MONONUCLEAR NO ORGANISMS SEEN Performed at [facility]  06/10/2010    ABUNDANT WBC PRESENT,BOTH PMN AND MONONUCLEAR NO ORGANISMS SEEN   CULT NO GROWTH 08/10/2012   LABORGA SERRATIA MARCESCENS 06/10/2010     Lab Results  Component Value Date   ALBUMIN 3.9 01/21/2007    No results found for: MG No results found for: VD25OH  No results found for: PREALBUMIN CBC EXTENDED Latest Ref Rng & Units 08/30/2017 05/17/2017 04/19/2017  WBC 4.0 - 10.5 K/uL 9.9 9.7 11.3(H)  RBC 3.87 - 5.11 Mil/uL 3.89 3.55(L) 3.87  HGB 12.0 - 15.0 g/dL 11.9(L) 11.6(L) 12.8  HCT 36.0 - 46.0 % 36.5 35.2(L) 38.4  PLT 150.0 - 400.0 K/uL 289.0 274 302  NEUTROABS 1.4 - 7.7 K/uL 6.3 - -  LYMPHSABS 0.7 - 4.0 K/uL 2.8 - -     Body mass index is 29.45 kg/m.  Orders:  Orders Placed This Encounter  Procedures  . XR Foot 2 Views Left   No orders of the defined types were placed in this encounter.    Procedures: No procedures performed  Clinical Data: No additional findings.  ROS:  All other systems negative, except as noted in the HPI. Review of Systems  Objective: Vital Signs: Ht 5\' 5"  (1.651 m)   Wt 177 lb (80.3 kg)   BMI 29.45 kg/m   Specialty Comments:  No specialty comments available.  PMFS History: Patient Active Problem List   Diagnosis Date Noted  . 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 Upper respiratory tract infection 06/17/2018  . 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 Aortic atherosclerosis ([facility]) 04/25/2018  . Cervical stenosis of spinal canal 05/17/2017  . 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 At risk from passive smoking 04/18/2016  . Moderate persistent asthma with acute exacerbation 04/18/2016  . 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 Chronic cough 11/06/2012  . Lung nodule 09/24/2012  . Mild persistent asthma, uncomplicated [id number]    . Lumbar post-laminectomy syndrome 03/22/2012  . Depression with anxiety 09/29/2011  . Cervical post-laminectomy syndrome 08/28/2011  . Arachnoiditis 08/28/2011  . Cervical radicular pain 08/28/2011  . Anxiety 08/28/2011  . Headache(784.0)   . Dyspnea 12/23/2010  . Hypertension    Past Medical History:  Diagnosis Date  . Acute sinusitis, unspecified   . Allergy   . Anemia    "quite a few times"  . Anxiety state, unspecified   . Arachnoiditis BILATERAL LEGS   DUE TO MULTIPLE BACK SURG.'S  . Arthritis   . Asthma    last flare up was 03/2017 lasted over a month  . Blood transfusion   . Cancer ([facility])    skin cancers (in scalp)  . Cardiomyopathy HX --06/2010   EF was 25% during acute illness (PHELONEPHRITIS) Repeat echo 12-06-10 60% showed normal EF.   Marland Kitchen Chronic back pain greater than 3 months duration    S/P BACK SURG'S  . CSF leak   . Diabetes mellitus without complication ([facility])    dx 2013 type 2  . Dyslipidemia   . Dysphagia    some post op cerv fusion 2/14  . Dysrhythmia   . Essential hypertension, benign   . Family history of adverse reaction to anesthesia    mother gets n/v  . GERD (gastroesophageal reflux disease) AND HIATIAL HERNIA   CONTROLLED W/ NEXIUM  . Headache(784.0)   . Heart murmur    DENIES S & S   (ECHO JUN'12 W/ CHART)  . History of chronic bronchitis   . Hx of bladder infections   . Hyperlipidemia    TAKE CHLOSTEROL MEDICATION,04/23/19  . Hypertension   . Murmur, heart   . Neuromuscular disorder (HCC)    numbness and tingling  . Osteoporosis   . Other malaise and fatigue   . PONV (postoperative nausea and vomiting)   . Shortness of breath   . Spinal headache   . Spinal stenosis, cervical region   . Varicosities    venous  . Weakness of both legs DUE TO ARACHNOIDITIS   OCCASIONAL USES CANE    Family History  Problem Relation Age of Onset  . Hypertension Mother   . Heart disease Mother   . Diabetes Mother   . Cancer Father   . Diabetes  Brother   . Colon cancer Neg Hx   . Colon polyps Neg Hx   . Esophageal cancer Neg Hx   . Rectal cancer Neg Hx   . Stomach cancer Neg Hx     Past Surgical History:  Procedure Laterality Date  . ABDOMINAL HYSTERECTOMY  1987   W/ BSO  . ANTERIOR CERVICAL DECOMP/DISCECTOMY FUSION N/A 08/09/2012  Procedure: ANTERIOR CERVICAL DECOMPRESSION/DISCECTOMY FUSION 1 LEVEL;  Surgeon: Floyce Stakes, MD;  Location: [facility];  Service: Neurosurgery;  Laterality: N/A;  Cervical four-five  Anterior cervical decompression/diskectomy/fusion  . ANTERIOR FUSION CERVICAL SPINE  2007   C5 -6  . APPENDECTOMY    . APPLICATION OF INTRAOPERATIVE CT SCAN N/A 05/17/2017   Procedure: APPLICATION OF INTRAOPERATIVE CT SCAN;  Surgeon: Erline Levine, MD;  Location: [facility];  Service: Neurosurgery;  Laterality: N/A;  . BACK SURGERY     x 5  . BREAST EXCISIONAL BIOPSY Bilateral    No scar seen   . BREAST SURGERY     x 2 biopsies  . CARPAL TUNNEL RELEASE  RIGHT - 2001  & DEC 2011 W/ BACK SURG.  . CERVICAL DISC SURGERY  2005   C5 - 6  . CHOLECYSTECTOMY  1994  . DORSAL COMPARTMENT RELEASE Right 04/07/2013   Procedure: RIGHT WRIST STS RELEASE;  Surgeon: Schuyler Amor, MD;  Location: [facility];  Service: Orthopedics;  Laterality: Right;  . ENDOVENOUS ABLATION SAPHENOUS VEIN W/ LASER Right 05/29/2019   endovenous laser ablation right greater saphenous vein by Gae Gallop MD   . [place] CSF LEAK  [place] 2011  X2   POST [place]  . FINGER ARTHRODESIS Right 04/07/2013   Procedure: RIGHT INDEX AND RIGHT LONG DISTAL INTERPHALANGEAL JOINT FUSIONS;  Surgeon: Schuyler Amor, MD;  Location: [facility];  Service: Orthopedics;  Laterality: Right;  . FINGER ARTHROPLASTY Right 04/07/2013   Procedure: RIGHT THUMB [place] ARTHROPLASTY;  Surgeon: Schuyler Amor, MD;  Location: [facility];  Service: Orthopedics;  Laterality: Right;  . GANGLION CYST EXCISION  Right 04/07/2013   Procedure: RIGHT WRIST MASS EXCISION;  Surgeon: Schuyler Amor, MD;  Location: [facility];  Service: Orthopedics;  Laterality: Right;  . KNEE ARTHROSCOPY  LEFT X3 (LAST ONE 2005)  . KNEE ARTHROSCOPY  05/17/2011   Procedure: ARTHROSCOPY KNEE;  Surgeon: Bradley Ferris III;  Location: [facility];  Service: Orthopedics;  Laterality: Right;  WITH MEDIAL MENISECTOMY AND removal of suprapatella fat lump  . LEFT WRIST TENOSYNECTOMY W/ LEFT THUMB JOINT REPAIR  02-24-10  . LUMBAR FUSION  11-30-10   L4 - 5  . LUMBAR FUSION  2000   L2 - 4  . LUMBAR LAMINECTOMY  DEC 2011   L4 - 5  . POSTERIOR CERVICAL FUSION/FORAMINOTOMY N/A 05/17/2017   Procedure: Cervical five  to Thorastic one  Posterior cervical fusion with lateral mass screws/revision of prior instrumentation;  Surgeon: Erline Levine, MD;  Location: [facility];  Service: Neurosurgery;  Laterality: N/A;  C5 to T1 Posterior cervical fusion with lateral mass screws/revision of prior instrumentation  . TENDON REPAIR  JAN 2010   LEFT INDEX AND LONG FINGERS  . THUMB SURGERY   04/07/2019   RIGHT THUMB  . TUBAL LIGATION     Social History   Occupational History  . Occupation: disable    Employer: DISABLED  Tobacco Use  . Smoking status: Never Smoker  . Smokeless tobacco: Never Used  Substance and Sexual Activity  . Alcohol use: No  . Drug use: No  . Sexual activity: Not on file    Comment: second hand smoke

## 2019-11-14 DIAGNOSIS — I10 Essential (primary) hypertension: Secondary | ICD-10-CM | POA: Diagnosis not present

## 2019-11-14 DIAGNOSIS — E119 Type 2 diabetes mellitus without complications: Secondary | ICD-10-CM | POA: Diagnosis not present

## 2019-11-14 DIAGNOSIS — E782 Mixed hyperlipidemia: Secondary | ICD-10-CM | POA: Diagnosis not present

## 2019-11-28 ENCOUNTER — Ambulatory Visit (INDEPENDENT_AMBULATORY_CARE_PROVIDER_SITE_OTHER): Payer: Medicare Other | Admitting: Family

## 2019-11-28 ENCOUNTER — Encounter: Payer: Self-pay | Admitting: Family

## 2019-11-28 ENCOUNTER — Other Ambulatory Visit: Payer: Self-pay

## 2019-11-28 VITALS — Ht 65.0 in | Wt 177.0 lb

## 2019-11-28 DIAGNOSIS — M25872 Other specified joint disorders, left ankle and foot: Secondary | ICD-10-CM | POA: Diagnosis not present

## 2019-12-03 ENCOUNTER — Encounter: Payer: Self-pay | Admitting: Family

## 2019-12-03 DIAGNOSIS — M25872 Other specified joint disorders, left ankle and foot: Secondary | ICD-10-CM

## 2019-12-03 MED ORDER — METHYLPREDNISOLONE ACETATE 40 MG/ML IJ SUSP
40.0000 mg | INTRAMUSCULAR | Status: AC | PRN
  Administered 2019-12-03: 40 mg via INTRA_ARTICULAR

## 2019-12-03 MED ORDER — LIDOCAINE HCL 1 % IJ SOLN
2.0000 mL | INTRAMUSCULAR | Status: AC | PRN
  Administered 2019-12-03: 2 mL

## 2019-12-03 NOTE — Progress Notes (Signed)
Office Visit Note   Patient: [name] [name]           Date of Birth: 1951-02-23           MRN: [id number] Visit Date: 11/28/2019              Requested by: Neale Burly, MD [place],  [place] [id number] PCP: Neale Burly, MD  Chief Complaint  Patient presents with  . Left Foot - Follow-up      HPI: The patient is a 69 year old woman seen today in follow-up for left ankle pain.  Pain has been ongoing for about 6 weeks now.  She has not had any injury to start things off.  She has been in a short cam walker for the last several weeks and she is using a cane she does not feel the cam walker has improved her pain.  Continues with lateral ankle and foot pain and some mild associated swelling.   Assessment & Plan: Visit Diagnoses:  1. Impingement syndrome of left ankle     Plan: Depo-Medrol injection of the ankle today.  Patient tolerated well.  She will follow-up in 3 to 4 weeks Follow-Up Instructions: Return in about 4 weeks (around 12/26/2019).   Right Ankle Exam   Tenderness  The patient is experiencing tenderness in the ATF (anterior joint line). Swelling: moderate  Range of Motion  The patient has normal right ankle ROM.  Muscle Strength  The patient has normal right ankle strength.  Tests  Anterior drawer: negative Varus tilt: positive  Other  Erythema: absent Sensation: normal Pulse: present       Patient is alert, oriented, no adenopathy, well-dressed, normal affect, normal respiratory effort.   Imaging: No results found. No images are attached to the encounter.  Labs: Lab Results  Component Value Date   HGBA1C 5.8 (H) 04/19/2017   REPTSTATUS 08/11/2012 FINAL 08/10/2012   GRAMSTAIN  06/10/2010    ABUNDANT WBC PRESENT,BOTH PMN AND MONONUCLEAR NO ORGANISMS SEEN Performed at [facility]  06/10/2010    ABUNDANT WBC PRESENT,BOTH PMN AND MONONUCLEAR NO ORGANISMS SEEN Performed at [facility]  06/10/2010    ABUNDANT WBC PRESENT,BOTH PMN AND MONONUCLEAR NO ORGANISMS SEEN   CULT NO GROWTH 08/10/2012   LABORGA SERRATIA MARCESCENS 06/10/2010     Lab Results  Component Value Date   ALBUMIN 3.9 01/21/2007    No results found for: MG No results found for: VD25OH  No results found for: PREALBUMIN CBC EXTENDED Latest Ref Rng & Units 08/30/2017 05/17/2017 04/19/2017  WBC 4.0 - 10.5 K/uL 9.9 9.7 11.3(H)  RBC 3.87 - 5.11 Mil/uL 3.89 3.55(L) 3.87  HGB 12.0 - 15.0 g/dL 11.9(L) 11.6(L) 12.8  HCT 36 - 46 % 36.5 35.2(L) 38.4  PLT 150 - 400 K/uL 289.0 274 302  NEUTROABS 1.4 - 7.7 K/uL 6.3 - -  LYMPHSABS 0.7 - 4.0 K/uL 2.8 - -     Body mass index is 29.45 kg/m.  Orders:  Orders Placed This Encounter  Procedures  . Medium Joint Inj   No orders of the defined types were placed in this encounter.    Procedures: Medium Joint Inj: L ankle on 12/03/2019 9:35 AM Indications: pain Details: 25 G needle, anterolateral approach Medications: 2 mL lidocaine 1 %; 40 mg methylPREDNISolone acetate 40 MG/ML Outcome: tolerated well, no immediate complications Consent was given by the patient. Patient was prepped and draped  in the usual sterile fashion.      Clinical Data: No additional findings.  ROS:  All other systems negative, except as noted in the HPI. Review of Systems  Constitutional: Negative for chills and fever.  Musculoskeletal: Positive for arthralgias and gait problem.    Objective: Vital Signs: Ht 5\' 5"  (1.651 m)   Wt 177 lb (80.3 kg)   BMI 29.45 kg/m   Specialty Comments:  No specialty comments available.  PMFS History: Patient Active Problem List   Diagnosis Date Noted  . 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 Upper respiratory tract infection 06/17/2018  . 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 Aortic atherosclerosis ([facility]) 04/25/2018  . Cervical stenosis of spinal canal 05/17/2017  . Irritable larynx  02/27/2017  . At risk from passive smoking 04/18/2016  . Moderate persistent asthma with acute exacerbation 04/18/2016  . 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 Chronic cough 11/06/2012  . Lung nodule 09/24/2012  . Mild persistent asthma, uncomplicated 99/37/1696  . Lumbar post-laminectomy syndrome 03/22/2012  . Depression with anxiety 09/29/2011  . Cervical post-laminectomy syndrome 08/28/2011  . Arachnoiditis 08/28/2011  . Cervical radicular pain 08/28/2011  . Anxiety 08/28/2011  . Headache(784.0)   . Dyspnea 12/23/2010  . Hypertension    Past Medical History:  Diagnosis Date  . Acute sinusitis, unspecified   . Allergy   . Anemia    "quite a few times"  . Anxiety state, unspecified   . Arachnoiditis BILATERAL LEGS   DUE TO MULTIPLE BACK SURG.'S  . Arthritis   . Asthma    last flare up was 03/2017 lasted over a month  . Blood transfusion   . Cancer ([facility])    skin cancers (in scalp)  . Cardiomyopathy HX --06/2010   EF was 25% during acute illness (PHELONEPHRITIS) Repeat echo 12-06-10 60% showed normal EF.   Marland Kitchen Chronic back pain greater than 3 months duration    S/P BACK SURG'S  . CSF leak   . Diabetes mellitus without complication ([facility])    dx 2013 type 2  . Dyslipidemia   . Dysphagia    some post op cerv fusion 2/14  . Dysrhythmia   . Essential hypertension, benign   . Family history of adverse reaction to anesthesia    mother gets n/v  . GERD (gastroesophageal reflux disease) AND HIATIAL HERNIA   CONTROLLED W/ NEXIUM  . Headache(784.0)   . Heart murmur    DENIES S & S   (ECHO JUN'12 W/ CHART)  . History of chronic bronchitis   . Hx of bladder infections   . Hyperlipidemia    TAKE CHLOSTEROL MEDICATION,04/23/19  . Hypertension   . Murmur, heart   . Neuromuscular disorder (HCC)    numbness and tingling  . Osteoporosis   . Other malaise and fatigue   . PONV (postoperative nausea and vomiting)   . Shortness of breath   . Spinal headache   . Spinal stenosis,  cervical region   . Varicosities    venous  . Weakness of both legs DUE TO ARACHNOIDITIS   OCCASIONAL USES CANE    Family History  Problem Relation Age of Onset  . Hypertension Mother   . Heart disease Mother   . Diabetes Mother   . Cancer Father   . Diabetes Brother   . Colon cancer Neg Hx   . Colon polyps Neg Hx   . Esophageal cancer Neg Hx   . Rectal cancer Neg Hx   .  Stomach cancer Neg Hx     Past Surgical History:  Procedure Laterality Date  . ABDOMINAL HYSTERECTOMY  1987   W/ BSO  . ANTERIOR CERVICAL DECOMP/DISCECTOMY FUSION N/A 08/09/2012   Procedure: ANTERIOR CERVICAL DECOMPRESSION/DISCECTOMY FUSION 1 LEVEL;  Surgeon: Floyce Stakes, MD;  [facility];  Service: Neurosurgery;  Laterality: N/A;  Cervical four-five  Anterior cervical decompression/diskectomy/fusion  . ANTERIOR FUSION CERVICAL SPINE  2007   C5 -6  . APPENDECTOMY    . APPLICATION OF INTRAOPERATIVE CT SCAN N/A 05/17/2017   Procedure: APPLICATION OF INTRAOPERATIVE CT SCAN;  Surgeon: Erline Levine, MD;  Location: [facility];  Service: Neurosurgery;  Laterality: N/A;  . BACK SURGERY     x 5  . BREAST EXCISIONAL BIOPSY Bilateral    No scar seen   . BREAST SURGERY     x 2 biopsies  . CARPAL TUNNEL RELEASE  RIGHT - 2001  & DEC 2011 W/ BACK SURG.  . CERVICAL DISC SURGERY  2005   C5 - 6  . CHOLECYSTECTOMY  1994  . DORSAL COMPARTMENT RELEASE Right 04/07/2013   Procedure: RIGHT WRIST STS RELEASE;  Surgeon: Schuyler Amor, MD;  Location: [facility];  Service: Orthopedics;  Laterality: Right;  . ENDOVENOUS ABLATION SAPHENOUS VEIN W/ LASER Right 05/29/2019   endovenous laser ablation right greater saphenous vein by Gae Gallop MD   . [place] CSF LEAK  [place] 2011  X2   POST [place]  . FINGER ARTHRODESIS Right 04/07/2013   Procedure: RIGHT INDEX AND RIGHT LONG DISTAL INTERPHALANGEAL JOINT FUSIONS;  Surgeon: Schuyler Amor, MD;  Location: [facility];  Service: Orthopedics;  Laterality: Right;  . FINGER ARTHROPLASTY Right 04/07/2013   Procedure: RIGHT THUMB [place] ARTHROPLASTY;  Surgeon: Schuyler Amor, MD;  Location: [facility];  Service: Orthopedics;  Laterality: Right;  . GANGLION CYST EXCISION Right 04/07/2013   Procedure: RIGHT WRIST MASS EXCISION;  Surgeon: Schuyler Amor, MD;  Location: [facility];  Service: Orthopedics;  Laterality: Right;  . KNEE ARTHROSCOPY  LEFT X3 (LAST ONE 2005)  . KNEE ARTHROSCOPY  05/17/2011   Procedure: ARTHROSCOPY KNEE;  Surgeon: Bradley Ferris III;  Location: [facility];  Service: Orthopedics;  Laterality: Right;  WITH MEDIAL MENISECTOMY AND removal of suprapatella fat lump  . LEFT WRIST TENOSYNECTOMY W/ LEFT THUMB JOINT REPAIR  02-24-10  . LUMBAR FUSION  11-30-10   L4 - 5  . LUMBAR FUSION  2000   L2 - 4  . LUMBAR LAMINECTOMY  DEC 2011   L4 - 5  . POSTERIOR CERVICAL FUSION/FORAMINOTOMY N/A 05/17/2017   Procedure: Cervical five  to Thorastic one  Posterior cervical fusion with lateral mass screws/revision of prior instrumentation;  Surgeon: Erline Levine, MD;  Location: [facility];  Service: Neurosurgery;  Laterality: N/A;  C5 to T1 Posterior cervical fusion with lateral mass screws/revision of prior instrumentation  . TENDON REPAIR  JAN 2010   LEFT INDEX AND LONG FINGERS  . THUMB SURGERY   04/07/2019   RIGHT THUMB  . TUBAL LIGATION     Social History   Occupational History  . Occupation: disable    Employer: DISABLED  Tobacco Use  . Smoking status: Never Smoker  . Smokeless tobacco: Never Used  Vaping Use  . Vaping Use: Never used  Substance and Sexual Activity  . Alcohol use: No  . Drug use: No  . Sexual activity: Not on file  Comment: second hand smoke

## 2019-12-10 ENCOUNTER — Other Ambulatory Visit: Payer: Self-pay

## 2019-12-10 ENCOUNTER — Encounter: Payer: Medicare Other | Admitting: Registered Nurse

## 2019-12-10 DIAGNOSIS — G039 Meningitis, unspecified: Secondary | ICD-10-CM

## 2019-12-10 DIAGNOSIS — M961 Postlaminectomy syndrome, not elsewhere classified: Secondary | ICD-10-CM

## 2019-12-10 MED ORDER — OXYMORPHONE HCL ER 40 MG PO T12A: 40.0000 mg | EXTENDED_RELEASE_TABLET | Freq: Two times a day (BID) | ORAL | 0 refills | Status: DC

## 2019-12-10 MED ORDER — OXYCODONE HCL 10 MG PO TABS: 10.0000 mg | ORAL_TABLET | Freq: Four times a day (QID) | ORAL | 0 refills | Status: DC | PRN

## 2019-12-10 NOTE — Telephone Encounter (Signed)
PMP was Reviewed. [name] [name] called office stating she was ill this morning, her appointment was changed to next week. Medication e-scribed today, she verbalizes understanding.

## 2019-12-10 NOTE — Telephone Encounter (Signed)
[name] [name] is having nausea with vomiting. She cancelled her appointment today. Patient has requested Oxycodone & Oxymorphone refills.  Appointment r/s for next Monday. Or do you want to do a phone visit today?

## 2019-12-15 ENCOUNTER — Telehealth: Payer: Self-pay | Admitting: Registered Nurse

## 2019-12-15 ENCOUNTER — Ambulatory Visit: Payer: Medicare Other | Admitting: Registered Nurse

## 2019-12-15 NOTE — Telephone Encounter (Signed)
Patient is negative for Covid, she has rescheduled her apt for this Friday. She is being treated for Pneumonia and has some congestion but other than that is okay.

## 2019-12-19 ENCOUNTER — Ambulatory Visit: Payer: Medicare Other | Admitting: Registered Nurse

## 2019-12-19 ENCOUNTER — Ambulatory Visit: Payer: Medicare Other | Admitting: Family

## 2019-12-29 ENCOUNTER — Ambulatory Visit: Payer: Medicare Other | Admitting: Physician Assistant

## 2019-12-30 ENCOUNTER — Other Ambulatory Visit: Payer: Self-pay | Admitting: Registered Nurse

## 2019-12-30 DIAGNOSIS — M961 Postlaminectomy syndrome, not elsewhere classified: Secondary | ICD-10-CM

## 2019-12-30 DIAGNOSIS — G039 Meningitis, unspecified: Secondary | ICD-10-CM

## 2020-01-02 ENCOUNTER — Encounter: Payer: Self-pay | Admitting: Registered Nurse

## 2020-01-02 ENCOUNTER — Other Ambulatory Visit: Payer: Self-pay

## 2020-01-02 ENCOUNTER — Ambulatory Visit
Admission: RE | Admit: 2020-01-02 | Discharge: 2020-01-02 | Disposition: A | Discharge status: Home / Self Care | Payer: Medicare Other | Source: Ambulatory Visit | Attending: Registered Nurse | Admitting: Registered Nurse

## 2020-01-02 ENCOUNTER — Encounter: Payer: Medicare Other | Attending: Physical Medicine & Rehabilitation | Admitting: Registered Nurse

## 2020-01-02 VITALS — BP 107/73 | HR 94 | Temp 98.3°F | Ht <= 58 in | Wt 175.0 lb

## 2020-01-02 DIAGNOSIS — Z79891 Long term (current) use of opiate analgesic: Secondary | ICD-10-CM

## 2020-01-02 DIAGNOSIS — G894 Chronic pain syndrome: Secondary | ICD-10-CM

## 2020-01-02 DIAGNOSIS — Z79899 Other long term (current) drug therapy: Secondary | ICD-10-CM | POA: Insufficient documentation

## 2020-01-02 DIAGNOSIS — G4485 Primary stabbing headache: Secondary | ICD-10-CM | POA: Diagnosis not present

## 2020-01-02 DIAGNOSIS — M961 Postlaminectomy syndrome, not elsewhere classified: Secondary | ICD-10-CM | POA: Diagnosis not present

## 2020-01-02 DIAGNOSIS — M5416 Radiculopathy, lumbar region: Secondary | ICD-10-CM

## 2020-01-02 DIAGNOSIS — M542 Cervicalgia: Secondary | ICD-10-CM

## 2020-01-02 DIAGNOSIS — Z5181 Encounter for therapeutic drug level monitoring: Secondary | ICD-10-CM

## 2020-01-02 DIAGNOSIS — M25572 Pain in left ankle and joints of left foot: Secondary | ICD-10-CM

## 2020-01-02 DIAGNOSIS — M546 Pain in thoracic spine: Secondary | ICD-10-CM

## 2020-01-02 DIAGNOSIS — M5412 Radiculopathy, cervical region: Secondary | ICD-10-CM | POA: Diagnosis not present

## 2020-01-02 DIAGNOSIS — G8929 Other chronic pain: Secondary | ICD-10-CM | POA: Diagnosis not present

## 2020-01-02 DIAGNOSIS — M5441 Lumbago with sciatica, right side: Secondary | ICD-10-CM | POA: Insufficient documentation

## 2020-01-02 DIAGNOSIS — G039 Meningitis, unspecified: Secondary | ICD-10-CM | POA: Diagnosis not present

## 2020-01-02 MED ORDER — OXYCODONE HCL 10 MG PO TABS: 10.0000 mg | ORAL_TABLET | Freq: Four times a day (QID) | ORAL | 0 refills | Status: DC | PRN

## 2020-01-02 MED ORDER — OXYMORPHONE HCL ER 40 MG PO T12A: 40.0000 mg | EXTENDED_RELEASE_TABLET | Freq: Two times a day (BID) | ORAL | 0 refills | Status: DC

## 2020-01-02 MED ORDER — GABAPENTIN 600 MG PO TABS: 600.0000 mg | ORAL_TABLET | Freq: Three times a day (TID) | ORAL | 0 refills | Status: DC

## 2020-01-02 NOTE — Progress Notes (Signed)
Subjective:    Patient ID: [name] [name], female    DOB: 1950/09/11, 69 y.o.   MRN: [id number]  HPI: [name] [name] is a 69 y.o. female who returns for follow up appointment for chronic pain and medication refill. She states her pain is located in her neck radiating into her head ( occipital region) and bilateral shoulders also reports lower back pain radiating into her buttocks and left lower extremity. Also reports left ankle pain.  [name] [name] states over the last 90 days she's having increase intensity on neck pain and decreased ROM with flexion and extension. Also states when she's having a headache the pain is constant and describes her headache as a  stabbing pain. [name] [name] also states  She has a history of migraines and this headache doesn't feel like a migraine even though she admits having had bouts of nausea.Cervical X-rays will be ordered today, she verbalizes understanding. Also instructed if she develops another headache with increase intensity to call her PCP/go to Urgent Care or ED for evaluation, she verbalizes understanding. [name] [name] refuses ED evaluation at this time and states she will go to the ED if the pain returns. She rates her pain 8. Her current exercise regime is walking and performing stretching exercises.  [name] [name] also states she went to court with her family member last Thursday, and prior to going she had taken Valium 2.5 mg, she is prescribed this medication. Last fill date was  11/15/2018.   [name] [name] equivalent is 300.00 MME.  UDS was Performed Today.    Pain Inventory Average Pain 7 Pain Right Now 8 My pain is constant, sharp, burning, stabbing and aching  In the last 24 hours, has pain interfered with the following? General activity 7 Relation with others 7 Enjoyment of life 7 What TIME of day is your pain at its worst? All the time. Sleep (in general) Fair  Pain is worse with: walking, bending, sitting, inactivity, standing and some  activites Pain improves with: therapy/exercise and medication Relief from Meds: 7  Mobility use a cane how many minutes can you walk? 15 mins ability to climb steps?  yes do you drive?  yes  Function retired  Neuro/Psych weakness numbness tremor tingling trouble walking spasms dizziness  Prior Studies Any changes since last visit?  yes Left foot xray.  Physicians involved in your care Any changes since last visit?  yes Orthopedist [facility]   Family History  Problem Relation Age of Onset  . Hypertension Mother   . Heart disease Mother   . Diabetes Mother   . Cancer Father   . Diabetes Brother   . Colon cancer Neg Hx   . Colon polyps Neg Hx   . Esophageal cancer Neg Hx   . Rectal cancer Neg Hx   . Stomach cancer Neg Hx    Social History   Socioeconomic History  . Marital status: Divorced    Spouse name: Not on file  . Number of children: Not on file  . Years of education: Not on file  . Highest education level: Not on file  Occupational History  . Occupation: disable    Employer: DISABLED  Tobacco Use  . Smoking status: Never Smoker  . Smokeless tobacco: Never Used  Vaping Use  . Vaping Use: Never used  Substance and Sexual Activity  . Alcohol use: No  . Drug use: No  . Sexual activity: Not on file    Comment: second hand  smoke  Other Topics Concern  . Not on file  Social History Narrative   Has one child   Disabled   Social Determinants of Health   Financial Resource Strain:   . Difficulty of Paying Living Expenses:   Food Insecurity:   . Worried About Charity fundraiser in the Last Year:   . Arboriculturist in the Last Year:   Transportation Needs:   . Film/video editor (Medical):   Marland Kitchen Lack of Transportation (Non-Medical):   Physical Activity:   . Days of Exercise per Week:   . Minutes of Exercise per Session:   Stress:   . Feeling of Stress :   Social Connections:   . Frequency of Communication with Friends and  Family:   . Frequency of Social Gatherings with Friends and Family:   . Attends Religious Services:   . Active Member of Clubs or Organizations:   . Attends Archivist Meetings:   Marland Kitchen Marital Status:    Past Surgical History:  Procedure Laterality Date  . ABDOMINAL HYSTERECTOMY  1987   W/ BSO  . ANTERIOR CERVICAL DECOMP/DISCECTOMY FUSION N/A 08/09/2012   Procedure: ANTERIOR CERVICAL DECOMPRESSION/DISCECTOMY FUSION 1 LEVEL;  Surgeon: Floyce Stakes, MD;  [facility];  Service: Neurosurgery;  Laterality: N/A;  Cervical four-five  Anterior cervical decompression/diskectomy/fusion  . ANTERIOR FUSION CERVICAL SPINE  2007   C5 -6  . APPENDECTOMY    . APPLICATION OF INTRAOPERATIVE CT SCAN N/A 05/17/2017   Procedure: APPLICATION OF INTRAOPERATIVE CT SCAN;  Surgeon: Erline Levine, MD;  Location: [place];  Service: Neurosurgery;  Laterality: N/A;  . BACK SURGERY     x 5  . BREAST EXCISIONAL BIOPSY Bilateral    No scar seen   . BREAST SURGERY     x 2 biopsies  . CARPAL TUNNEL RELEASE  RIGHT - 2001  & DEC 2011 W/ BACK SURG.  . CERVICAL DISC SURGERY  2005   C5 - 6  . CHOLECYSTECTOMY  1994  . DORSAL COMPARTMENT RELEASE Right 04/07/2013   Procedure: RIGHT WRIST STS RELEASE;  Surgeon: Schuyler Amor, MD;  Location: [place];  Service: Orthopedics;  Laterality: Right;  . ENDOVENOUS ABLATION SAPHENOUS VEIN W/ LASER Right 05/29/2019   endovenous laser ablation right greater saphenous vein by Gae Gallop MD   . [place] CSF LEAK  [place] 2011  X2   POST [place]  . FINGER ARTHRODESIS Right 04/07/2013   Procedure: RIGHT INDEX AND RIGHT LONG DISTAL INTERPHALANGEAL JOINT FUSIONS;  Surgeon: Schuyler Amor, MD;  Location: [place];  Service: Orthopedics;  Laterality: Right;  . FINGER ARTHROPLASTY Right 04/07/2013   Procedure: RIGHT THUMB [place] ARTHROPLASTY;  Surgeon: Schuyler Amor, MD;  Location: [place];   Service: Orthopedics;  Laterality: Right;  . GANGLION CYST EXCISION Right 04/07/2013   Procedure: RIGHT WRIST MASS EXCISION;  Surgeon: Schuyler Amor, MD;  Location: [place];  Service: Orthopedics;  Laterality: Right;  . KNEE ARTHROSCOPY  LEFT X3 (LAST ONE 2005)  . KNEE ARTHROSCOPY  05/17/2011   Procedure: ARTHROSCOPY KNEE;  Surgeon: Bradley Ferris III;  Location: [place];  Service: Orthopedics;  Laterality: Right;  WITH MEDIAL MENISECTOMY AND removal of suprapatella fat lump  . LEFT WRIST TENOSYNECTOMY W/ LEFT THUMB JOINT REPAIR  02-24-10  . LUMBAR FUSION  11-30-10   L4 - 5  . LUMBAR FUSION  2000  L2 - 4  . LUMBAR LAMINECTOMY  DEC 2011   L4 - 5  . POSTERIOR CERVICAL FUSION/FORAMINOTOMY N/A 05/17/2017   Procedure: Cervical five  to Thorastic one  Posterior cervical fusion with lateral mass screws/revision of prior instrumentation;  Surgeon: Erline Levine, MD;  Location: [place];  Service: Neurosurgery;  Laterality: N/A;  C5 to T1 Posterior cervical fusion with lateral mass screws/revision of prior instrumentation  . TENDON REPAIR  JAN 2010   LEFT INDEX AND LONG FINGERS  . THUMB SURGERY   04/07/2019   RIGHT THUMB  . TUBAL LIGATION     Past Medical History:  Diagnosis Date  . Acute sinusitis, unspecified   . Allergy   . Anemia    "quite a few times"  . Anxiety state, unspecified   . Arachnoiditis BILATERAL LEGS   DUE TO MULTIPLE BACK SURG.'S  . Arthritis   . Asthma    last flare up was 03/2017 lasted over a month  . Blood transfusion   . Cancer ([place])    skin cancers (in scalp)  . Cardiomyopathy HX --06/2010   EF was 25% during acute illness (PHELONEPHRITIS) Repeat echo 12-06-10 60% showed normal EF.   Marland Kitchen Chronic back pain greater than 3 months duration    S/P BACK SURG'S  . CSF leak   . Diabetes mellitus without complication ([place])    dx 2013 type 2  . Dyslipidemia   . Dysphagia    some post op cerv fusion 2/14  . Dysrhythmia   .  Essential hypertension, benign   . Family history of adverse reaction to anesthesia    mother gets n/v  . GERD (gastroesophageal reflux disease) AND HIATIAL HERNIA   CONTROLLED W/ NEXIUM  . Headache(784.0)   . Heart murmur    DENIES S & S   (ECHO JUN'12 W/ CHART)  . History of chronic bronchitis   . Hx of bladder infections   . Hyperlipidemia    TAKE CHLOSTEROL MEDICATION,04/23/19  . Hypertension   . Murmur, heart   . Neuromuscular disorder (HCC)    numbness and tingling  . Osteoporosis   . Other malaise and fatigue   . PONV (postoperative nausea and vomiting)   . Shortness of breath   . Spinal headache   . Spinal stenosis, cervical region   . Varicosities    venous  . Weakness of both legs DUE TO ARACHNOIDITIS   OCCASIONAL USES CANE   BP 107/73   Pulse 94   Temp 98.3 F (36.8 C)   Ht 1' (0.305 m)   Wt 175 lb (79.4 kg) Comment: per patient -weight  decline due to ortho boot  SpO2 96%   BMI 854.43 kg/m   Opioid Risk Score:   Fall Risk Score:  `1  Depression screen PHQ 2/9  Depression screen [facility] 2/9 01/02/2020 10/13/2019 06/25/2019 10/30/2018 08/14/2018 03/12/2018 10/25/2017  Decreased Interest 0 0 0 0 0 0 0  Down, Depressed, Hopeless 0 0 0 0 - 0 0  PHQ - 2 Score 0 0 0 0 0 0 0  Altered sleeping - - - - - - -  Tired, decreased energy - - - - - - -  Change in appetite - - - - - - -  Feeling bad or failure about yourself  - - - - - - -  Trouble concentrating - - - - - - -  Moving slowly or fidgety/restless - - - - - - -  Suicidal thoughts - - - - - - -  PHQ-9 Score - - - - - - -  Some recent data might be hidden   Review of Systems  Constitutional: Negative.   HENT: Negative.   Eyes: Negative.   Respiratory: Negative.   Cardiovascular: Negative.   Gastrointestinal: Negative.   Endocrine: Negative.   Genitourinary: Negative.   Musculoskeletal: Positive for back pain and gait problem.       Spasms  Skin: Negative.   Allergic/Immunologic: Negative.   Neurological:  Positive for weakness and numbness.  Hematological: Negative.   Psychiatric/Behavioral: Negative.        Objective:   Physical Exam Vitals and nursing note reviewed.  Constitutional:      Appearance: Normal appearance.  Neck:     Comments: Cervical Paraspinal Tenderness: C-5-C-6 Decreased ROM with Cervical Flexion: 20 Degrees Decrease ROM with Cervical Extension: 20 Degrees Cardiovascular:     Rate and Rhythm: Normal rate and regular rhythm.     Pulses: Normal pulses.     Heart sounds: Normal heart sounds.  Pulmonary:     Effort: Pulmonary effort is normal.     Breath sounds: Normal breath sounds.  Musculoskeletal:     Cervical back: Normal range of motion and neck supple.     Comments: Normal Muscle Bulk and Muscle Testing Reveals:  Upper Extremities: Full ROM and Muscle Strength 5/5 Bilateral AC Joint Tenderness  Thoracic Paraspinal Tenderness: T-7-T-9 Lumbar Paraspinal Tenderness: L-3-L-5 Lower Extremities: Full ROM and Muscle Strength 5/5 Wearing Left Cam-boot   Arises from Table with ease using cane for support Narrow Based Gait   Skin:    General: Skin is warm and dry.  Neurological:     Mental Status: She is alert and oriented to person, place, and time.  Psychiatric:        Mood and Affect: Mood normal.        Behavior: Behavior normal.           Assessment & Plan:  1. On 05/17/2017 :C5C6C7 T1 Posterior Cervical Fusion with lateral mass fixation with AIRO Imaging, revision of prior instrumentation. By Dr. Vertell Limber.  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 Continuecurrent medication regimen withGabapentin800 mgBID.Refilled:Oxycodone 10mg  one tablet every 6 hours as needed #120tabletsand Oxymorphone HCL 40 mg every 12 hours #60.01/02/2020 We will continue the opioid monitoring program, this consists of regular clinic visits, examinations, urine drug screen, pill counts as well as use of [place] Controlled  Substance Reporting System. 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 Anxiety/depression: PCP Following. Continueto monitor.01/02/2020 4. Muscle Spasms: Continuecurrent medication regime withFlexeril. Continue to Monitor.01/02/2020 5. Cervicalgia/ Cervical Radiculitis: RX: Cervical X-ray. Continuecurrent medication regime withGabapentin: Dr. Vertell Limber Following: S/P C5C6C7 T1 Posterior Cervical Fusion with lateral mass fixation with AIRO Imaging, revision of prior instrumentation by Dr. Vertell Limber on 05/17/2017.01/02/2020 7. Bilateral Thoracic Back Pain:Continue HEP as Tolerated and Continue current medication regimen. Continue to Monitor.11/11/2019 8. Left lower extremity DVT/ Phlebitis: S/P endovenous laser ablation of left great saphenous vein on 05/09/2018 by Dr. Scot Dock: Vascular Following.11/11/2019. 9. Insomnia:ContinuePamelor. Continue to Monitor.11/11/2019. 10. Acute Left Ankle Pain: Ortho Following. Continue to monitor. 11. Stabbing Headache: Refuses ED evaluation. Ms. Gallaway states she will F/U with PCP and she will go To ED if headache pain increases in intensity.  F/U in 1 month  30 minutes of face to face patient care time was spent during this visit. All questions were encouraged and answered.

## 2020-01-06 LAB — TOXASSURE SELECT,+ANTIDEPR,UR

## 2020-01-07 ENCOUNTER — Telehealth: Payer: Self-pay | Admitting: *Deleted

## 2020-01-07 ENCOUNTER — Telehealth: Payer: Self-pay | Admitting: Registered Nurse

## 2020-01-07 NOTE — Telephone Encounter (Signed)
Prior authorization for gabapentin 800 mg approved.  07/31/2019-06/18/2020

## 2020-01-07 NOTE — Telephone Encounter (Signed)
Placed a call to Ms. Laden regarding X-ray results, no answer. Left message to return the call.

## 2020-01-13 ENCOUNTER — Telehealth: Payer: Self-pay | Admitting: *Deleted

## 2020-01-13 NOTE — Telephone Encounter (Signed)
Urine drug screen for this encounter is consistent for prescribed medication. She has had an Rx for diazepam.

## 2020-01-15 ENCOUNTER — Ambulatory Visit (INDEPENDENT_AMBULATORY_CARE_PROVIDER_SITE_OTHER): Payer: Medicare Other

## 2020-01-15 ENCOUNTER — Ambulatory Visit (INDEPENDENT_AMBULATORY_CARE_PROVIDER_SITE_OTHER): Payer: Medicare Other | Admitting: Physician Assistant

## 2020-01-15 ENCOUNTER — Encounter: Payer: Self-pay | Admitting: Orthopedic Surgery

## 2020-01-15 VITALS — Wt 175.0 lb

## 2020-01-15 DIAGNOSIS — M25512 Pain in left shoulder: Secondary | ICD-10-CM

## 2020-01-15 DIAGNOSIS — M25872 Other specified joint disorders, left ankle and foot: Secondary | ICD-10-CM

## 2020-01-15 NOTE — Progress Notes (Signed)
Office Visit Note   Patient: [name] [name]           Date of Birth: March 08, 1951           MRN: [id number] Visit Date: 01/15/2020              Requested by: Neale Burly, MD [place],  [place] [id number] PCP: Neale Burly, MD  Chief Complaint  Patient presents with  . Left Shoulder - Pain  . Left Ankle - Pain      HPI: This is a pleasant woman who follows up for her left injury to her ankle.  She was doing very well when she fell a week ago.  She felt like her ankle rolled.  She thinks she may have irritated it and wants to make sure she did not do further damage.  She is also complaining of a knot on the proximal part of her clavicle.  Assessment & Plan: Visit Diagnoses:  1. Impingement syndrome of left ankle   2. Left shoulder pain, unspecified chronicity     Plan: She will use the boot but should come out of it to work on wrinkle ankle range of motion.  With regards to her clavicle she is not very tender x-rays reviewed by Dr. Sharol Given did not see any acute abnormality I think this will improve with time  Follow-Up Instructions: No follow-ups on file.   Ortho Exam  Patient is alert, oriented, no adenopathy, well-dressed, normal affect, normal respiratory effort. Left ankle: Mild soft tissue swelling good range of motion she is tender over the ATFL.  No tenderness over the fifth metatarsal mild tenderness on the medial side of the ankle.  CMS is intact  Imaging: No results found. No images are attached to the encounter.  Labs: Lab Results  Component Value Date   HGBA1C 5.8 (H) 04/19/2017   REPTSTATUS 08/11/2012 FINAL 08/10/2012   GRAMSTAIN  06/10/2010    ABUNDANT WBC PRESENT,BOTH PMN AND MONONUCLEAR NO ORGANISMS SEEN Performed at [facility]  06/10/2010    ABUNDANT WBC PRESENT,BOTH PMN AND MONONUCLEAR NO ORGANISMS SEEN Performed at [place]  06/10/2010    ABUNDANT WBC PRESENT,BOTH PMN AND MONONUCLEAR NO  ORGANISMS SEEN   CULT NO GROWTH 08/10/2012   LABORGA SERRATIA MARCESCENS 06/10/2010     Lab Results  Component Value Date   ALBUMIN 3.9 01/21/2007    No results found for: MG No results found for: VD25OH  No results found for: PREALBUMIN CBC EXTENDED Latest Ref Rng & Units 08/30/2017 05/17/2017 04/19/2017  WBC 4.0 - 10.5 K/uL 9.9 9.7 11.3(H)  RBC 3.87 - 5.11 Mil/uL 3.89 3.55(L) 3.87  HGB 12.0 - 15.0 g/dL 11.9(L) 11.6(L) 12.8  HCT 36 - 46 % 36.5 35.2(L) 38.4  PLT 150 - 400 K/uL 289.0 274 302  NEUTROABS 1.4 - 7.7 K/uL 6.3 - -  LYMPHSABS 0.7 - 4.0 K/uL 2.8 - -     Body mass index is 854.43 kg/m.  Orders:  Orders Placed This Encounter  Procedures  . XR Ankle 2 Views Left  . XR Clavicle Left  . XR Tibia/Fibula Left   No orders of the defined types were placed in this encounter.    Procedures: No procedures performed  Clinical Data: No additional findings.  ROS:  All other systems negative, except as noted in the HPI. Review of Systems  Objective: Vital Signs: Wt 175 lb (79.4 kg)  BMI 854.43 kg/m   Specialty Comments:  No specialty comments available.  PMFS History: Patient Active Problem List   Diagnosis Date Noted  . 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 Upper respiratory tract infection 06/17/2018  . 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 Aortic atherosclerosis ([place]) 04/25/2018  . Cervical stenosis of spinal canal 05/17/2017  . 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 At risk from passive smoking 04/18/2016  . Moderate persistent asthma with acute exacerbation 04/18/2016  . 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 Chronic cough 11/06/2012  . Lung nodule 09/24/2012  . Mild persistent asthma, uncomplicated 70/96/2836  . Lumbar post-laminectomy syndrome 03/22/2012  . Depression with anxiety 09/29/2011  . Cervical post-laminectomy syndrome 08/28/2011  . Arachnoiditis 08/28/2011  . Cervical radicular pain 08/28/2011  .  Anxiety 08/28/2011  . Headache(784.0)   . Dyspnea 12/23/2010  . Hypertension    Past Medical History:  Diagnosis Date  . Acute sinusitis, unspecified   . Allergy   . Anemia    "quite a few times"  . Anxiety state, unspecified   . Arachnoiditis BILATERAL LEGS   DUE TO MULTIPLE BACK SURG.'S  . Arthritis   . Asthma    last flare up was 03/2017 lasted over a month  . Blood transfusion   . Cancer ([place])    skin cancers (in scalp)  . Cardiomyopathy HX --06/2010   EF was 25% during acute illness (PHELONEPHRITIS) Repeat echo 12-06-10 60% showed normal EF.   Marland Kitchen Chronic back pain greater than 3 months duration    S/P BACK SURG'S  . CSF leak   . Diabetes mellitus without complication ([place])    dx 2013 type 2  . Dyslipidemia   . Dysphagia    some post op cerv fusion 2/14  . Dysrhythmia   . Essential hypertension, benign   . Family history of adverse reaction to anesthesia    mother gets n/v  . GERD (gastroesophageal reflux disease) AND HIATIAL HERNIA   CONTROLLED W/ NEXIUM  . Headache(784.0)   . Heart murmur    DENIES S & S   (ECHO JUN'12 W/ CHART)  . History of chronic bronchitis   . Hx of bladder infections   . Hyperlipidemia    TAKE CHLOSTEROL MEDICATION,04/23/19  . Hypertension   . Murmur, heart   . Neuromuscular disorder (HCC)    numbness and tingling  . Osteoporosis   . Other malaise and fatigue   . PONV (postoperative nausea and vomiting)   . Shortness of breath   . Spinal headache   . Spinal stenosis, cervical region   . Varicosities    venous  . Weakness of both legs DUE TO ARACHNOIDITIS   OCCASIONAL USES CANE    Family History  Problem Relation Age of Onset  . Hypertension Mother   . Heart disease Mother   . Diabetes Mother   . Cancer Father   . Diabetes Brother   . Colon cancer Neg Hx   . Colon polyps Neg Hx   . Esophageal cancer Neg Hx   . Rectal cancer Neg Hx   . Stomach cancer Neg Hx     Past Surgical History:  Procedure Laterality Date  .  ABDOMINAL HYSTERECTOMY  1987   W/ BSO  . ANTERIOR CERVICAL DECOMP/DISCECTOMY FUSION N/A 08/09/2012   Procedure: ANTERIOR CERVICAL DECOMPRESSION/DISCECTOMY FUSION 1 LEVEL;  Surgeon: Floyce Stakes, MD;  [facility];  Service: Neurosurgery;  Laterality: N/A;  Cervical four-five  Anterior cervical  decompression/diskectomy/fusion  . ANTERIOR FUSION CERVICAL SPINE  2007   C5 -6  . APPENDECTOMY    . APPLICATION OF INTRAOPERATIVE CT SCAN N/A 05/17/2017   Procedure: APPLICATION OF INTRAOPERATIVE CT SCAN;  Surgeon: Erline Levine, MD;  Location: [place];  Service: Neurosurgery;  Laterality: N/A;  . BACK SURGERY     x 5  . BREAST EXCISIONAL BIOPSY Bilateral    No scar seen   . BREAST SURGERY     x 2 biopsies  . CARPAL TUNNEL RELEASE  RIGHT - 2001  & DEC 2011 W/ BACK SURG.  . CERVICAL DISC SURGERY  2005   C5 - 6  . CHOLECYSTECTOMY  1994  . DORSAL COMPARTMENT RELEASE Right 04/07/2013   Procedure: RIGHT WRIST STS RELEASE;  Surgeon: Schuyler Amor, MD;  Location: [place];  Service: Orthopedics;  Laterality: Right;  . ENDOVENOUS ABLATION SAPHENOUS VEIN W/ LASER Right 05/29/2019   endovenous laser ablation right greater saphenous vein by Gae Gallop MD   . [place] CSF LEAK  [place] 2011  X2   POST [place]  . FINGER ARTHRODESIS Right 04/07/2013   Procedure: RIGHT INDEX AND RIGHT LONG DISTAL INTERPHALANGEAL JOINT FUSIONS;  Surgeon: Schuyler Amor, MD;  Location: [place];  Service: Orthopedics;  Laterality: Right;  . FINGER ARTHROPLASTY Right 04/07/2013   Procedure: RIGHT THUMB [place] ARTHROPLASTY;  Surgeon: Schuyler Amor, MD;  Location: [place];  Service: Orthopedics;  Laterality: Right;  . GANGLION CYST EXCISION Right 04/07/2013   Procedure: RIGHT WRIST MASS EXCISION;  Surgeon: Schuyler Amor, MD;  Location: [place];  Service: Orthopedics;  Laterality: Right;  . KNEE ARTHROSCOPY  LEFT X3  (LAST ONE 2005)  . KNEE ARTHROSCOPY  05/17/2011   Procedure: ARTHROSCOPY KNEE;  Surgeon: Bradley Ferris III;  Location: [place];  Service: Orthopedics;  Laterality: Right;  WITH MEDIAL MENISECTOMY AND removal of suprapatella fat lump  . LEFT WRIST TENOSYNECTOMY W/ LEFT THUMB JOINT REPAIR  02-24-10  . LUMBAR FUSION  11-30-10   L4 - 5  . LUMBAR FUSION  2000   L2 - 4  . LUMBAR LAMINECTOMY  DEC 2011   L4 - 5  . POSTERIOR CERVICAL FUSION/FORAMINOTOMY N/A 05/17/2017   Procedure: Cervical five  to Thorastic one  Posterior cervical fusion with lateral mass screws/revision of prior instrumentation;  Surgeon: Erline Levine, MD;  Location: [place];  Service: Neurosurgery;  Laterality: N/A;  C5 to T1 Posterior cervical fusion with lateral mass screws/revision of prior instrumentation  . TENDON REPAIR  JAN 2010   LEFT INDEX AND LONG FINGERS  . THUMB SURGERY   04/07/2019   RIGHT THUMB  . TUBAL LIGATION     Social History   Occupational History  . Occupation: disable    Employer: DISABLED  Tobacco Use  . Smoking status: Never Smoker  . Smokeless tobacco: Never Used  Vaping Use  . Vaping Use: Never used  Substance and Sexual Activity  . Alcohol use: No  . Drug use: No  . Sexual activity: Not on file    Comment: second hand smoke

## 2020-01-23 ENCOUNTER — Other Ambulatory Visit: Payer: Self-pay | Admitting: Internal Medicine

## 2020-01-28 ENCOUNTER — Other Ambulatory Visit: Payer: Self-pay

## 2020-01-28 ENCOUNTER — Encounter: Payer: Medicare Other | Attending: Physical Medicine & Rehabilitation | Admitting: Registered Nurse

## 2020-01-28 ENCOUNTER — Encounter: Payer: Self-pay | Admitting: Registered Nurse

## 2020-01-28 VITALS — BP 97/68 | HR 93 | Temp 98.3°F | Ht 65.0 in | Wt 171.6 lb

## 2020-01-28 DIAGNOSIS — G894 Chronic pain syndrome: Secondary | ICD-10-CM | POA: Diagnosis not present

## 2020-01-28 DIAGNOSIS — Z5181 Encounter for therapeutic drug level monitoring: Secondary | ICD-10-CM | POA: Insufficient documentation

## 2020-01-28 DIAGNOSIS — G8929 Other chronic pain: Secondary | ICD-10-CM

## 2020-01-28 DIAGNOSIS — G039 Meningitis, unspecified: Secondary | ICD-10-CM | POA: Diagnosis not present

## 2020-01-28 DIAGNOSIS — M5412 Radiculopathy, cervical region: Secondary | ICD-10-CM

## 2020-01-28 DIAGNOSIS — M546 Pain in thoracic spine: Secondary | ICD-10-CM | POA: Diagnosis not present

## 2020-01-28 DIAGNOSIS — M542 Cervicalgia: Secondary | ICD-10-CM | POA: Insufficient documentation

## 2020-01-28 DIAGNOSIS — Z79899 Other long term (current) drug therapy: Secondary | ICD-10-CM | POA: Diagnosis not present

## 2020-01-28 DIAGNOSIS — M5416 Radiculopathy, lumbar region: Secondary | ICD-10-CM | POA: Diagnosis not present

## 2020-01-28 DIAGNOSIS — M5441 Lumbago with sciatica, right side: Secondary | ICD-10-CM | POA: Insufficient documentation

## 2020-01-28 DIAGNOSIS — Z79891 Long term (current) use of opiate analgesic: Secondary | ICD-10-CM | POA: Insufficient documentation

## 2020-01-28 DIAGNOSIS — M961 Postlaminectomy syndrome, not elsewhere classified: Secondary | ICD-10-CM | POA: Insufficient documentation

## 2020-01-28 MED ORDER — OXYMORPHONE HCL ER 40 MG PO T12A: 40.0000 mg | EXTENDED_RELEASE_TABLET | Freq: Two times a day (BID) | ORAL | 0 refills | Status: DC

## 2020-01-28 MED ORDER — GABAPENTIN 800 MG PO TABS: 800.0000 mg | ORAL_TABLET | Freq: Three times a day (TID) | ORAL | 3 refills | Status: DC

## 2020-01-28 MED ORDER — OXYCODONE HCL 10 MG PO TABS: 10.0000 mg | ORAL_TABLET | Freq: Four times a day (QID) | ORAL | 0 refills | Status: DC | PRN

## 2020-01-28 NOTE — Progress Notes (Signed)
Subjective:    Patient ID: [name] [name], female    DOB: 10-Mar-1951, 69 y.o.   MRN: [id number]  HPI: [name] [name] is a 69 y.o. female who returns for follow up appointment for chronic pain and medication refill. She states her pain is located in her neck radiating into her bilateral shoulders, mid- lower back pain radiating into her right lower extremity. Also reports increase intensity of radicular pain, she has an appointment with Dr Vertell Limber on Monday 02/02/2020 she reports. She rates her pain 8.Her current exercise regime is pool therapy, walking and performing stretching exercises.  [name] [name] Morphine equivalent is 300.00 MME.  Last UDS was Performed on 01/02/2020, it was consistent.    Pain Inventory Average Pain 7 Pain Right Now 8 My pain is constant, sharp, burning, stabbing, tingling and aching  In the last 24 hours, has pain interfered with the following? General activity 7 Relation with others 7 Enjoyment of life 7 What TIME of day is your pain at its worst? pain around the clock.  Sleep (in general) Fair  Pain is worse with: walking, bending, sitting, inactivity, standing and some activites Pain improves with: heat/ice and medication Relief from Meds: 7  Mobility use a cane use a walker how many minutes can you walk? 5 mins ability to climb steps?  yes do you drive?  yes  Function I need assistance with the following:  household duties Do you have any goals in this area?  yes  Neuro/Psych numbness tremor tingling trouble walking spasms dizziness anxiety  Prior Studies Any changes since last visit?  yes [employer] involved in your care Any changes since last visit?  no   Family History  Problem Relation Age of Onset  . Hypertension Mother   . Heart disease Mother   . Diabetes Mother   . Cancer Father   . Diabetes Brother   . Colon cancer Neg Hx   . Colon polyps Neg Hx   . Esophageal cancer Neg Hx   . Rectal cancer Neg Hx    . Stomach cancer Neg Hx    Social History   Socioeconomic History  . Marital status: Divorced    Spouse name: Not on file  . Number of children: Not on file  . Years of education: Not on file  . Highest education level: Not on file  Occupational History  . Occupation: disable    Employer: DISABLED  Tobacco Use  . Smoking status: Never Smoker  . Smokeless tobacco: Never Used  Vaping Use  . Vaping Use: Never used  Substance and Sexual Activity  . Alcohol use: No  . Drug use: No  . Sexual activity: Not on file    Comment: second hand smoke  Other Topics Concern  . Not on file  Social History Narrative   Has one child   Disabled   Social Determinants of Health   Financial Resource Strain:   . Difficulty of Paying Living Expenses:   Food Insecurity:   . Worried About Charity fundraiser in the Last Year:   . Arboriculturist in the Last Year:   Transportation Needs:   . Film/video editor (Medical):   Marland Kitchen Lack of Transportation (Non-Medical):   Physical Activity:   . Days of Exercise per Week:   . Minutes of Exercise per Session:   Stress:   . Feeling of Stress :   Social Connections:   . Frequency of Communication with  Friends and Family:   . Frequency of Social Gatherings with Friends and Family:   . Attends Religious Services:   . Active Member of Clubs or Organizations:   . Attends Archivist Meetings:   Marland Kitchen Marital Status:    Past Surgical History:  Procedure Laterality Date  . ABDOMINAL HYSTERECTOMY  1987   W/ BSO  . ANTERIOR CERVICAL DECOMP/DISCECTOMY FUSION N/A 08/09/2012   Procedure: ANTERIOR CERVICAL DECOMPRESSION/DISCECTOMY FUSION 1 LEVEL;  Surgeon: Floyce Stakes, MD;  Location: [facility];  Service: Neurosurgery;  Laterality: N/A;  Cervical four-five  Anterior cervical decompression/diskectomy/fusion  . ANTERIOR FUSION CERVICAL SPINE  2007   C5 -6  . APPENDECTOMY    . APPLICATION OF INTRAOPERATIVE CT SCAN N/A 05/17/2017   Procedure:  APPLICATION OF INTRAOPERATIVE CT SCAN;  Surgeon: Erline Levine, MD;  Location: [place];  Service: Neurosurgery;  Laterality: N/A;  . BACK SURGERY     x 5  . BREAST EXCISIONAL BIOPSY Bilateral    No scar seen   . BREAST SURGERY     x 2 biopsies  . CARPAL TUNNEL RELEASE  RIGHT - 2001  & DEC 2011 W/ BACK SURG.  . CERVICAL DISC SURGERY  2005   C5 - 6  . CHOLECYSTECTOMY  1994  . DORSAL COMPARTMENT RELEASE Right 04/07/2013   Procedure: RIGHT WRIST STS RELEASE;  Surgeon: Schuyler Amor, MD;  Location: [place];  Service: Orthopedics;  Laterality: Right;  . ENDOVENOUS ABLATION SAPHENOUS VEIN W/ LASER Right 05/29/2019   endovenous laser ablation right greater saphenous vein by Gae Gallop MD   . [place] CSF LEAK  [place] 2011  X2   POST [place]  . FINGER ARTHRODESIS Right 04/07/2013   Procedure: RIGHT INDEX AND RIGHT LONG DISTAL INTERPHALANGEAL JOINT FUSIONS;  Surgeon: Schuyler Amor, MD;  Location: [employer];  Service: Orthopedics;  Laterality: Right;  . FINGER ARTHROPLASTY Right 04/07/2013   Procedure: RIGHT THUMB [place] ARTHROPLASTY;  Surgeon: Schuyler Amor, MD;  Location: [place];  Service: Orthopedics;  Laterality: Right;  . GANGLION CYST EXCISION Right 04/07/2013   Procedure: RIGHT WRIST MASS EXCISION;  Surgeon: Schuyler Amor, MD;  Location: [place];  Service: Orthopedics;  Laterality: Right;  . KNEE ARTHROSCOPY  LEFT X3 (LAST ONE 2005)  . KNEE ARTHROSCOPY  05/17/2011   Procedure: ARTHROSCOPY KNEE;  Surgeon: Bradley Ferris III;  Location: [place];  Service: Orthopedics;  Laterality: Right;  WITH MEDIAL MENISECTOMY AND removal of suprapatella fat lump  . LEFT WRIST TENOSYNECTOMY W/ LEFT THUMB JOINT REPAIR  02-24-10  . LUMBAR FUSION  11-30-10   L4 - 5  . LUMBAR FUSION  2000   L2 - 4  . LUMBAR LAMINECTOMY  DEC 2011   L4 - 5  . POSTERIOR CERVICAL FUSION/FORAMINOTOMY N/A  05/17/2017   Procedure: Cervical five  to Thorastic one  Posterior cervical fusion with lateral mass screws/revision of prior instrumentation;  Surgeon: Erline Levine, MD;  Location: [place];  Service: Neurosurgery;  Laterality: N/A;  C5 to T1 Posterior cervical fusion with lateral mass screws/revision of prior instrumentation  . TENDON REPAIR  JAN 2010   LEFT INDEX AND LONG FINGERS  . THUMB SURGERY   04/07/2019   RIGHT THUMB  . TUBAL LIGATION     Past Medical History:  Diagnosis Date  . Acute sinusitis, unspecified   . Allergy   . Anemia    "quite  a few times"  . Anxiety state, unspecified   . Arachnoiditis BILATERAL LEGS   DUE TO MULTIPLE BACK SURG.'S  . Arthritis   . Asthma    last flare up was 03/2017 lasted over a month  . Blood transfusion   . Cancer ([place])    skin cancers (in scalp)  . Cardiomyopathy HX --06/2010   EF was 25% during acute illness (PHELONEPHRITIS) Repeat echo 12-06-10 60% showed normal EF.   Marland Kitchen Chronic back pain greater than 3 months duration    S/P BACK SURG'S  . CSF leak   . Diabetes mellitus without complication ([place])    dx 2013 type 2  . Dyslipidemia   . Dysphagia    some post op cerv fusion 2/14  . Dysrhythmia   . Essential hypertension, benign   . Family history of adverse reaction to anesthesia    mother gets n/v  . GERD (gastroesophageal reflux disease) AND HIATIAL HERNIA   CONTROLLED W/ NEXIUM  . Headache(784.0)   . Heart murmur    DENIES S & S   (ECHO JUN'12 W/ CHART)  . History of chronic bronchitis   . Hx of bladder infections   . Hyperlipidemia    TAKE CHLOSTEROL MEDICATION,04/23/19  . Hypertension   . Murmur, heart   . Neuromuscular disorder (HCC)    numbness and tingling  . Osteoporosis   . Other malaise and fatigue   . PONV (postoperative nausea and vomiting)   . Shortness of breath   . Spinal headache   . Spinal stenosis, cervical region   . Varicosities    venous  . Weakness of both legs DUE TO ARACHNOIDITIS   OCCASIONAL  USES CANE   BP 97/68   Pulse 93   Temp 98.3 F (36.8 C)   Ht 5\' 5"  (1.651 m)   Wt 171 lb 9.6 oz (77.8 kg)   SpO2 95%   BMI 28.56 kg/m   Opioid Risk Score:   Fall Risk Score:  `1  Depression screen PHQ 2/9  Depression screen [facility] 2/9 01/02/2020 10/13/2019 06/25/2019 10/30/2018 08/14/2018 03/12/2018 10/25/2017  Decreased Interest 0 0 0 0 0 0 0  Down, Depressed, Hopeless 0 0 0 0 - 0 0  PHQ - 2 Score 0 0 0 0 0 0 0  Altered sleeping - - - - - - -  Tired, decreased energy - - - - - - -  Change in appetite - - - - - - -  Feeling bad or failure about yourself  - - - - - - -  Trouble concentrating - - - - - - -  Moving slowly or fidgety/restless - - - - - - -  Suicidal thoughts - - - - - - -  PHQ-9 Score - - - - - - -  Some recent data might be hidden   Review of Systems  Constitutional: Negative.   HENT: Negative.   Eyes: Negative.   Respiratory: Negative.   Cardiovascular: Negative.   Gastrointestinal: Negative.   Endocrine: Negative.   Genitourinary: Negative.   Musculoskeletal: Positive for gait problem and neck pain.       Spasms  Allergic/Immunologic: Negative.   Neurological: Positive for dizziness and weakness.  Psychiatric/Behavioral:       Anxiety  All other systems reviewed and are negative.      Objective:   Physical Exam Vitals and nursing note reviewed.  Constitutional:      Appearance: Normal appearance.  Neck:  Comments: Cervical Paraspinal Tenderness: C-5-C-6 Cardiovascular:     Rate and Rhythm: Normal rate and regular rhythm.     Pulses: Normal pulses.     Heart sounds: Normal heart sounds.  Pulmonary:     Effort: Pulmonary effort is normal.     Breath sounds: Normal breath sounds.  Musculoskeletal:     Cervical back: Normal range of motion and neck supple.     Comments: Normal Muscle Bulk and Muscle Testing Reveals:  Upper Extremities: Full ROM and Muscle Strength 5/5 Bilateral AC Joint tenderness  Thoracic Paraspinal Tenderness: T-7-T-9 Lumbar  Hypersensitivity Right Greater Trochanteric Tenderness Lower Extremities: Full ROM and Muscle Strength 5/5 Arises from Table with ease Narrow Based Gait   Skin:    General: Skin is warm and dry.  Neurological:     Mental Status: She is alert and oriented to person, place, and time.  Psychiatric:        Mood and Affect: Mood normal.        Behavior: Behavior normal.           Assessment & Plan:  1. On 05/17/2017 :C5C6C7 T1 Posterior Cervical Fusion with lateral mass fixation with AIRO Imaging, revision of prior instrumentation. By Dr. Vertell Limber.  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 Continuecurrent medication regimen withGabapentin800 mgBID.Refilled:Oxycodone 10mg  one tablet every 6 hours as needed #120tabletsand Oxymorphone HCL 40 mg every 12 hours #60.01/28/2020 We will continue the opioid monitoring program, this consists of regular clinic visits, examinations, urine drug screen, pill counts as well as use of [place] Controlled Substance Reporting System. 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 Anxiety/depression: PCP Following. Continueto monitor.01/28/2020 4. Muscle Spasms: Continuecurrent medication regime withFlexeril. Continue to Monitor.01/28/2020 5. Cervicalgia/ Cervical Radiculitis: She has an appointment with Dr Vertell Limber on 02/02/2020,Continuecurrent medication regime withGabapentin: Dr. Vertell Limber Following: S/P C5C6C7 T1 Posterior Cervical Fusion with lateral mass fixation with AIRO Imaging, revision of prior instrumentation by Dr. Vertell Limber on 05/17/2017.01/28/2020 7. Bilateral Thoracic Back Pain:Continue HEP as Tolerated and Continue current medication regimen. Continue to Monitor.01/28/2020 8. Left lower extremity DVT/ Phlebitis: S/P endovenous laser ablation of left great saphenous vein on 05/09/2018 by  Dr. Scot Dock: Vascular Following.01/28/2020. 9. Insomnia:ContinuePamelor. Continue to Monitor.01/28/2020. 10. Acute Left Ankle Pain: No complaints today.Ortho Following. Continue to monitor.01/28/2020  F/U in 1 month  20 minutes of face to face patient care time was spent during this visit. All questions were encouraged and answered.

## 2020-01-31 ENCOUNTER — Encounter: Payer: Self-pay | Admitting: Registered Nurse

## 2020-02-03 DIAGNOSIS — I10 Essential (primary) hypertension: Secondary | ICD-10-CM | POA: Diagnosis not present

## 2020-02-03 DIAGNOSIS — D239 Other benign neoplasm of skin, unspecified: Secondary | ICD-10-CM | POA: Diagnosis not present

## 2020-02-03 DIAGNOSIS — Z1331 Encounter for screening for depression: Secondary | ICD-10-CM | POA: Diagnosis not present

## 2020-02-03 DIAGNOSIS — Z0000 Encounter for general adult medical examination without abnormal findings: Secondary | ICD-10-CM | POA: Diagnosis not present

## 2020-02-03 DIAGNOSIS — E1143 Type 2 diabetes mellitus with diabetic autonomic (poly)neuropathy: Secondary | ICD-10-CM | POA: Diagnosis not present

## 2020-02-03 DIAGNOSIS — E782 Mixed hyperlipidemia: Secondary | ICD-10-CM | POA: Diagnosis not present

## 2020-02-04 ENCOUNTER — Other Ambulatory Visit: Payer: Self-pay | Admitting: Internal Medicine

## 2020-02-04 DIAGNOSIS — Z1231 Encounter for screening mammogram for malignant neoplasm of breast: Secondary | ICD-10-CM

## 2020-02-05 ENCOUNTER — Ambulatory Visit: Payer: Medicare Other | Admitting: Orthopedic Surgery

## 2020-02-12 ENCOUNTER — Ambulatory Visit: Payer: Medicare Other | Admitting: Orthopedic Surgery

## 2020-02-12 DIAGNOSIS — Z981 Arthrodesis status: Secondary | ICD-10-CM | POA: Diagnosis not present

## 2020-02-12 DIAGNOSIS — M19041 Primary osteoarthritis, right hand: Secondary | ICD-10-CM | POA: Diagnosis not present

## 2020-02-12 DIAGNOSIS — M180 Bilateral primary osteoarthritis of first carpometacarpal joints: Secondary | ICD-10-CM | POA: Diagnosis not present

## 2020-02-25 ENCOUNTER — Telehealth: Payer: Self-pay | Admitting: Cardiovascular Disease

## 2020-02-25 NOTE — Telephone Encounter (Signed)
Is she being referred back for something.  I reviewed the last note from Dr. Gwenlyn Found and he did not think that she needed further cardiac evaluation.

## 2020-02-25 NOTE — Telephone Encounter (Signed)
That’s fine with me

## 2020-02-25 NOTE — Telephone Encounter (Signed)
New Message   Pt would like to switch provider from Dr. Gwenlyn Found to Dr. Percival Spanish. She wanted to go back to Dr. Percival Spanish she sees him in 2013

## 2020-02-26 NOTE — Telephone Encounter (Signed)
    Hi Dr. Percival Spanish, yes a new referral received from pcp 02/25/2020, diagnosis said: DYSPNEA -REQUESTING ECHO

## 2020-02-26 NOTE — Telephone Encounter (Signed)
OK 

## 2020-02-27 ENCOUNTER — Ambulatory Visit: Payer: Medicare Other

## 2020-03-01 NOTE — Telephone Encounter (Signed)
Patient has appointment 10/25

## 2020-03-02 ENCOUNTER — Other Ambulatory Visit: Payer: Self-pay

## 2020-03-03 ENCOUNTER — Other Ambulatory Visit: Payer: Self-pay

## 2020-03-03 ENCOUNTER — Encounter: Payer: Self-pay | Admitting: Physical Medicine & Rehabilitation

## 2020-03-03 ENCOUNTER — Encounter: Payer: Medicare Other | Attending: Physical Medicine & Rehabilitation | Admitting: Physical Medicine & Rehabilitation

## 2020-03-03 ENCOUNTER — Ambulatory Visit: Payer: Medicare Other | Admitting: Physician Assistant

## 2020-03-03 ENCOUNTER — Ambulatory Visit: Payer: Medicare Other

## 2020-03-03 VITALS — BP 122/76 | HR 88 | Temp 98.7°F | Ht 65.0 in | Wt 169.2 lb

## 2020-03-03 DIAGNOSIS — Z5181 Encounter for therapeutic drug level monitoring: Secondary | ICD-10-CM | POA: Insufficient documentation

## 2020-03-03 DIAGNOSIS — M542 Cervicalgia: Secondary | ICD-10-CM | POA: Diagnosis not present

## 2020-03-03 DIAGNOSIS — G894 Chronic pain syndrome: Secondary | ICD-10-CM | POA: Diagnosis not present

## 2020-03-03 DIAGNOSIS — M5441 Lumbago with sciatica, right side: Secondary | ICD-10-CM | POA: Insufficient documentation

## 2020-03-03 DIAGNOSIS — M961 Postlaminectomy syndrome, not elsewhere classified: Secondary | ICD-10-CM | POA: Diagnosis not present

## 2020-03-03 DIAGNOSIS — G039 Meningitis, unspecified: Secondary | ICD-10-CM | POA: Diagnosis not present

## 2020-03-03 DIAGNOSIS — M5412 Radiculopathy, cervical region: Secondary | ICD-10-CM

## 2020-03-03 DIAGNOSIS — Z79891 Long term (current) use of opiate analgesic: Secondary | ICD-10-CM | POA: Insufficient documentation

## 2020-03-03 DIAGNOSIS — Z79899 Other long term (current) drug therapy: Secondary | ICD-10-CM | POA: Diagnosis not present

## 2020-03-03 DIAGNOSIS — M5416 Radiculopathy, lumbar region: Secondary | ICD-10-CM | POA: Diagnosis not present

## 2020-03-03 MED ORDER — TOPIRAMATE 25 MG PO TABS: 25.0000 mg | ORAL_TABLET | Freq: Two times a day (BID) | ORAL | 2 refills | Status: DC

## 2020-03-03 MED ORDER — OXYCODONE HCL 10 MG PO TABS: 10.0000 mg | ORAL_TABLET | Freq: Four times a day (QID) | ORAL | 0 refills | Status: DC | PRN

## 2020-03-03 MED ORDER — OXYMORPHONE HCL ER 40 MG PO TB12: 40.0000 mg | ORAL_TABLET | Freq: Two times a day (BID) | ORAL | 0 refills | Status: DC

## 2020-03-03 NOTE — Progress Notes (Signed)
Subjective:     Patient ID: [name] [name], female   DOB: 1951/03/09, 69 y.o.   MRN: [id number]  HPI   [name] [name] is here in follow-up of her chronic pain.  I last saw her in February.  She continues to have cervicalgia with radiation of pain into the right occiput.  She had met with Dr. Vertell Limber who had discussed potential greater occipital nerve blocks.  She will discuss these with me today.  Additionally she is having ongoing back and leg pain per baseline.  She is struggling with arthritis and trigger finger in her hands as well.  She remains on Opana 40 mg every 12 hours with oxycodone 10 mg 4 times a day as needed she also is on gabapentin 800 mg 3 times daily for neuropathic pain.  She tries to stay as active as she can at home.  Pain Inventory Average Pain 8 Pain Right Now 8 My pain is constant, sharp, burning, stabbing and aching  In the last 24 hours, has pain interfered with the following? General activity 7 Relation with others 7 Enjoyment of life 7 What TIME of day is your pain at its worst? varies Sleep (in general) Fair  Pain is worse with: walking, bending, sitting, inactivity, standing and some activites Pain improves with: medication and ice Relief from Meds: 7  Family History  Problem Relation Age of Onset  . Hypertension Mother   . Heart disease Mother   . Diabetes Mother   . Cancer Father   . Diabetes Brother   . Colon cancer Neg Hx   . Colon polyps Neg Hx   . Esophageal cancer Neg Hx   . Rectal cancer Neg Hx   . Stomach cancer Neg Hx    Social History   Socioeconomic History  . Marital status: Divorced    Spouse name: Not on file  . Number of children: Not on file  . Years of education: Not on file  . Highest education level: Not on file  Occupational History  . Occupation: disable    Employer: DISABLED  Tobacco Use  . Smoking status: Never Smoker  . Smokeless tobacco: Never Used  Vaping Use  . Vaping Use: Never used  Substance and Sexual  Activity  . Alcohol use: No  . Drug use: No  . Sexual activity: Not on file    Comment: second hand smoke  Other Topics Concern  . Not on file  Social History Narrative   Has one child   Disabled   Social Determinants of Health   Financial Resource Strain:   . Difficulty of Paying Living Expenses: Not on file  Food Insecurity:   . Worried About Charity fundraiser in the Last Year: Not on file  . Ran Out of Food in the Last Year: Not on file  Transportation Needs:   . Lack of Transportation (Medical): Not on file  . Lack of Transportation (Non-Medical): Not on file  Physical Activity:   . Days of Exercise per Week: Not on file  . Minutes of Exercise per Session: Not on file  Stress:   . Feeling of Stress : Not on file  Social Connections:   . Frequency of Communication with Friends and Family: Not on file  . Frequency of Social Gatherings with Friends and Family: Not on file  . Attends Religious Services: Not on file  . Active Member of Clubs or Organizations: Not on file  . Attends Archivist Meetings: Not  on file  . Marital Status: Not on file   Past Surgical History:  Procedure Laterality Date  . ABDOMINAL HYSTERECTOMY  1987   W/ BSO  . ANTERIOR CERVICAL DECOMP/DISCECTOMY FUSION N/A 08/09/2012   Procedure: ANTERIOR CERVICAL DECOMPRESSION/DISCECTOMY FUSION 1 LEVEL;  Surgeon: Floyce Stakes, MD;  Location: [facility];  Service: Neurosurgery;  Laterality: N/A;  Cervical four-five  Anterior cervical decompression/diskectomy/fusion  . ANTERIOR FUSION CERVICAL SPINE  2007   C5 -6  . APPENDECTOMY    . APPLICATION OF INTRAOPERATIVE CT SCAN N/A 05/17/2017   Procedure: APPLICATION OF INTRAOPERATIVE CT SCAN;  Surgeon: Erline Levine, MD;  Location: [place];  Service: Neurosurgery;  Laterality: N/A;  . BACK SURGERY     x 5  . BREAST EXCISIONAL BIOPSY Bilateral    No scar seen   . BREAST SURGERY     x 2 biopsies  . CARPAL TUNNEL RELEASE  RIGHT - 2001  & DEC 2011 W/  BACK SURG.  . CERVICAL DISC SURGERY  2005   C5 - 6  . CHOLECYSTECTOMY  1994  . DORSAL COMPARTMENT RELEASE Right 04/07/2013   Procedure: RIGHT WRIST STS RELEASE;  Surgeon: Schuyler Amor, MD;  Location: [place];  Service: Orthopedics;  Laterality: Right;  . ENDOVENOUS ABLATION SAPHENOUS VEIN W/ LASER Right 05/29/2019   endovenous laser ablation right greater saphenous vein by Gae Gallop MD   . [place] CSF LEAK  [place] 2011  X2   POST [place]  . FINGER ARTHRODESIS Right 04/07/2013   Procedure: RIGHT INDEX AND RIGHT LONG DISTAL INTERPHALANGEAL JOINT FUSIONS;  Surgeon: Schuyler Amor, MD;  Location: [place];  Service: Orthopedics;  Laterality: Right;  . FINGER ARTHROPLASTY Right 04/07/2013   Procedure: RIGHT THUMB [place] ARTHROPLASTY;  Surgeon: Schuyler Amor, MD;  Location: [place];  Service: Orthopedics;  Laterality: Right;  . GANGLION CYST EXCISION Right 04/07/2013   Procedure: RIGHT WRIST MASS EXCISION;  Surgeon: Schuyler Amor, MD;  Location: [place];  Service: Orthopedics;  Laterality: Right;  . KNEE ARTHROSCOPY  LEFT X3 (LAST ONE 2005)  . KNEE ARTHROSCOPY  05/17/2011   Procedure: ARTHROSCOPY KNEE;  Surgeon: Bradley Ferris III;  Location: [place];  Service: Orthopedics;  Laterality: Right;  WITH MEDIAL MENISECTOMY AND removal of suprapatella fat lump  . LEFT WRIST TENOSYNECTOMY W/ LEFT THUMB JOINT REPAIR  02-24-10  . LUMBAR FUSION  11-30-10   L4 - 5  . LUMBAR FUSION  2000   L2 - 4  . LUMBAR LAMINECTOMY  DEC 2011   L4 - 5  . POSTERIOR CERVICAL FUSION/FORAMINOTOMY N/A 05/17/2017   Procedure: Cervical five  to Thorastic one  Posterior cervical fusion with lateral mass screws/revision of prior instrumentation;  Surgeon: Erline Levine, MD;  Location: [place];  Service: Neurosurgery;  Laterality: N/A;  C5 to T1 Posterior cervical fusion with lateral mass screws/revision  of prior instrumentation  . TENDON REPAIR  JAN 2010   LEFT INDEX AND LONG FINGERS  . THUMB SURGERY   04/07/2019   RIGHT THUMB  . TUBAL LIGATION     Past Surgical History:  Procedure Laterality Date  . ABDOMINAL HYSTERECTOMY  1987   W/ BSO  . ANTERIOR CERVICAL DECOMP/DISCECTOMY FUSION N/A 08/09/2012   Procedure: ANTERIOR CERVICAL DECOMPRESSION/DISCECTOMY FUSION 1 LEVEL;  Surgeon: Floyce Stakes, MD;  Location[facility];  Service: Neurosurgery;  Laterality: N/A;  Cervical four-five  Anterior cervical  decompression/diskectomy/fusion  . ANTERIOR FUSION CERVICAL SPINE  2007   C5 -6  . APPENDECTOMY    . APPLICATION OF INTRAOPERATIVE CT SCAN N/A 05/17/2017   Procedure: APPLICATION OF INTRAOPERATIVE CT SCAN;  Surgeon: Erline Levine, MD;  Location: [place];  Service: Neurosurgery;  Laterality: N/A;  . BACK SURGERY     x 5  . BREAST EXCISIONAL BIOPSY Bilateral    No scar seen   . BREAST SURGERY     x 2 biopsies  . CARPAL TUNNEL RELEASE  RIGHT - 2001  & DEC 2011 W/ BACK SURG.  . CERVICAL DISC SURGERY  2005   C5 - 6  . CHOLECYSTECTOMY  1994  . DORSAL COMPARTMENT RELEASE Right 04/07/2013   Procedure: RIGHT WRIST STS RELEASE;  Surgeon: Schuyler Amor, MD;  Location: [place];  Service: Orthopedics;  Laterality: Right;  . ENDOVENOUS ABLATION SAPHENOUS VEIN W/ LASER Right 05/29/2019   endovenous laser ablation right greater saphenous vein by Gae Gallop MD   . [place] CSF LEAK  [place] 2011  X2   POST [place]  . FINGER ARTHRODESIS Right 04/07/2013   Procedure: RIGHT INDEX AND RIGHT LONG DISTAL INTERPHALANGEAL JOINT FUSIONS;  Surgeon: Schuyler Amor, MD;  Location: [place];  Service: Orthopedics;  Laterality: Right;  . FINGER ARTHROPLASTY Right 04/07/2013   Procedure: RIGHT THUMB [place] ARTHROPLASTY;  Surgeon: Schuyler Amor, MD;  Location: [place];  Service: Orthopedics;  Laterality: Right;  . GANGLION CYST  EXCISION Right 04/07/2013   Procedure: RIGHT WRIST MASS EXCISION;  Surgeon: Schuyler Amor, MD;  Location: [place];  Service: Orthopedics;  Laterality: Right;  . KNEE ARTHROSCOPY  LEFT X3 (LAST ONE 2005)  . KNEE ARTHROSCOPY  05/17/2011   Procedure: ARTHROSCOPY KNEE;  Surgeon: Bradley Ferris III;  Location: [place];  Service: Orthopedics;  Laterality: Right;  WITH MEDIAL MENISECTOMY AND removal of suprapatella fat lump  . LEFT WRIST TENOSYNECTOMY W/ LEFT THUMB JOINT REPAIR  02-24-10  . LUMBAR FUSION  11-30-10   L4 - 5  . LUMBAR FUSION  2000   L2 - 4  . LUMBAR LAMINECTOMY  DEC 2011   L4 - 5  . POSTERIOR CERVICAL FUSION/FORAMINOTOMY N/A 05/17/2017   Procedure: Cervical five  to Thorastic one  Posterior cervical fusion with lateral mass screws/revision of prior instrumentation;  Surgeon: Erline Levine, MD;  Location: [place];  Service: Neurosurgery;  Laterality: N/A;  C5 to T1 Posterior cervical fusion with lateral mass screws/revision of prior instrumentation  . TENDON REPAIR  JAN 2010   LEFT INDEX AND LONG FINGERS  . THUMB SURGERY   04/07/2019   RIGHT THUMB  . TUBAL LIGATION     Past Medical History:  Diagnosis Date  . Acute sinusitis, unspecified   . Allergy   . Anemia    "quite a few times"  . Anxiety state, unspecified   . Arachnoiditis BILATERAL LEGS   DUE TO MULTIPLE BACK SURG.'S  . Arthritis   . Asthma    last flare up was 03/2017 lasted over a month  . Blood transfusion   . Cancer ([place])    skin cancers (in scalp)  . Cardiomyopathy HX --06/2010   EF was 25% during acute illness (PHELONEPHRITIS) Repeat echo 12-06-10 60% showed normal EF.   Marland Kitchen Chronic back pain greater than 3 months duration    S/P BACK SURG'S  . CSF leak   . Diabetes mellitus  without complication ([place])    dx 2013 type 2  . Dyslipidemia   . Dysphagia    some post op cerv fusion 2/14  . Dysrhythmia   . Essential hypertension, benign   . Family history of adverse  reaction to anesthesia    mother gets n/v  . GERD (gastroesophageal reflux disease) AND HIATIAL HERNIA   CONTROLLED W/ NEXIUM  . Headache(784.0)   . Heart murmur    DENIES S & S   (ECHO JUN'12 W/ CHART)  . History of chronic bronchitis   . Hx of bladder infections   . Hyperlipidemia    TAKE CHLOSTEROL MEDICATION,04/23/19  . Hypertension   . Murmur, heart   . Neuromuscular disorder (HCC)    numbness and tingling  . Osteoporosis   . Other malaise and fatigue   . PONV (postoperative nausea and vomiting)   . Shortness of breath   . Spinal headache   . Spinal stenosis, cervical region   . Varicosities    venous  . Weakness of both legs DUE TO ARACHNOIDITIS   OCCASIONAL USES CANE   BP 122/76   Pulse 88   Temp 98.7 F (37.1 C)   Ht $R'5\' 5"'KY$  (1.651 m)   Wt 169 lb 3.2 oz (76.7 kg)   SpO2 95%   BMI 28.16 kg/m   Opioid Risk Score:   Fall Risk Score:  `1  Depression screen PHQ 2/9  Depression screen [facility] 2/9 01/02/2020 10/13/2019 06/25/2019 10/30/2018 08/14/2018 03/12/2018 10/25/2017  Decreased Interest 0 0 0 0 0 0 0  Down, Depressed, Hopeless 0 0 0 0 - 0 0  PHQ - 2 Score 0 0 0 0 0 0 0  Altered sleeping - - - - - - -  Tired, decreased energy - - - - - - -  Change in appetite - - - - - - -  Feeling bad or failure about yourself  - - - - - - -  Trouble concentrating - - - - - - -  Moving slowly or fidgety/restless - - - - - - -  Suicidal thoughts - - - - - - -  PHQ-9 Score - - - - - - -  Some recent data might be hidden   Review of Systems  HENT: Negative.   Eyes: Negative.   Respiratory: Negative.   Cardiovascular: Negative.   Gastrointestinal: Negative.   Endocrine: Negative.   Genitourinary: Negative.   Musculoskeletal: Positive for back pain, gait problem and neck pain. Negative for joint swelling.  Skin: Negative.   Allergic/Immunologic: Negative.   Hematological: Negative.   Psychiatric/Behavioral:       Anxiety  All other systems reviewed and are negative.       Objective:   Physical Exam Gen: no distress, normal appearing HEENT: oral mucosa pink and moist, NCAT Cardio: Reg rate Chest: normal effort, normal rate of breathing Abd: soft, non-distended Ext: no edema Skin: intact Neuro: UE motor 5/5. LE remains limited by pain 3+ to 4/5. Decreased LT in lower ext. DTR's absent in LE's Musculoskeletal: LB TTP. Legs sensitive to touch. Atrophy of cervical paraspinals. Cervical and occipital scalp tender to touch on right.  Right 1st and 2nd finger surgery sites along DIP's with slight swelling and TTP. Psych: pleasant, normal affect            Assessment & Plan:  1. On 05/17/2017 :C5C6C7 T1 Posterior Cervical Fusion with lateral mass fixation with AIRO Imaging, revision of prior instrumentation. By Dr.  Vertell Limber.   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              - Gabapentin 800 mg TID. Marland Kitchencontinue for now               - Oxycodone $RemoveBef'15mg'[id number]$  one tablet every 6 hours as needed #120 and Oxymorphone HCL 40 mg every 12 hours  #60.             --We will continue the controlled substance monitoring program, this consists of regular clinic visits, examinations, routine drug screening, pill counts as well as use of [place] Controlled Substance Reporting System. [place].              -Medication was refilled and a second prescription was sent to the patient's pharmacy for next month.                -Ultimately want to wean oxycodone. 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             -activity to tolerance 3. . Anxiety/depression: Continue Valium, PCP Following.            -is coping fairly well at present   4. Muscle Spasms:  No flexeril d/t GI upset.    5. Cervicalgia/ Cervical Radiculitis/headache:  Dr. Vertell Limber Following: S/P  C5C6C7 T1 Posterior Cervical Fusion with lateral mass fixation with AIRO Imaging, revision of prior instrumentation by  Dr. Vertell Limber on 05/17/2017.              -gabapentin as above             -discussed occipital nerve block, but I don't feel that it would provide any sustained relief given the above  -trial of topamax $RemoveBe'25mg'HurNMledX$  bid.. We will start at 25 mg nightly initially 7. Left hand OA, trigger fingers---per hand surgery   8. Left lower extremity DVT/ Phlebitis:  S/P endovenous laser ablation of left great saphenous vein on 05/09/2018 by Dr. Scot Dock:      15 minutes of face to face patient care time were spent during this visit. All questions were encouraged and answered.  Follow up me in about 2 mos .

## 2020-03-03 NOTE — Patient Instructions (Signed)
PLEASE FEEL FREE TO CALL OUR OFFICE WITH ANY PROBLEMS OR QUESTIONS ([phone])      

## 2020-03-04 DIAGNOSIS — Z23 Encounter for immunization: Secondary | ICD-10-CM | POA: Diagnosis not present

## 2020-03-25 DIAGNOSIS — Z23 Encounter for immunization: Secondary | ICD-10-CM | POA: Diagnosis not present

## 2020-03-29 ENCOUNTER — Ambulatory Visit: Payer: Medicare Other

## 2020-03-29 ENCOUNTER — Other Ambulatory Visit: Payer: Self-pay | Admitting: Registered Nurse

## 2020-03-29 DIAGNOSIS — M19049 Primary osteoarthritis, unspecified hand: Secondary | ICD-10-CM | POA: Diagnosis not present

## 2020-03-29 DIAGNOSIS — G039 Meningitis, unspecified: Secondary | ICD-10-CM

## 2020-03-29 DIAGNOSIS — M79641 Pain in right hand: Secondary | ICD-10-CM | POA: Diagnosis not present

## 2020-03-29 DIAGNOSIS — M961 Postlaminectomy syndrome, not elsewhere classified: Secondary | ICD-10-CM

## 2020-03-29 DIAGNOSIS — M180 Bilateral primary osteoarthritis of first carpometacarpal joints: Secondary | ICD-10-CM | POA: Diagnosis not present

## 2020-03-30 ENCOUNTER — Other Ambulatory Visit: Payer: Self-pay | Admitting: Orthopedic Surgery

## 2020-04-02 ENCOUNTER — Ambulatory Visit: Payer: Medicare Other | Admitting: Physician Assistant

## 2020-04-11 ENCOUNTER — Encounter: Payer: Self-pay | Admitting: Cardiology

## 2020-04-11 DIAGNOSIS — E785 Hyperlipidemia, unspecified: Secondary | ICD-10-CM | POA: Insufficient documentation

## 2020-04-11 NOTE — Progress Notes (Signed)
Cardiology Office Note   Date:  04/12/2020   ID:  [name] [name], DOB November 17, 1950, MRN [id number]  PCP:  Neale Burly, MD  Cardiologist:   No primary care provider on file. Referring:  Neale Burly, MD  Chief Complaint  Patient presents with  . Shortness of Breath      History of Present Illness: [name] [name] is a 69 y.o. female who is referred by her PCP for evaluation of dypsnea.  She is new to me and this is a new consultation for a new problem.  She was previously seen by Dr. Gwenlyn Found.  I reviewed these notes for this visit.  He saw her because of aortic atherosclerosis.   She had no coronary calcification.  There was no work up suggested. She now presents because of SOB.   She is short of breath with activity but wonders if this could be related to her asthma.  However, she remains active doing household chores.  She is not describing PND or orthopnea.  She is not describing new chest discomfort, neck or arm discomfort.  She has had some sporadic discomfort.  She was worried about a previous diagnosis of mitral valve prolapse although this was not identified on the most recent echo in 2018.  She reports that her heart rate goes up when she does any activity.  It goes into the 90s she is walking through the house.  She is not describing resting tachycardia.  She is not describing presyncope or syncope.   Past Medical History:  Diagnosis Date  . Acute sinusitis, unspecified   . Allergy   . Anemia    "quite a few times"  . Anxiety state, unspecified   . Arachnoiditis BILATERAL LEGS   DUE TO MULTIPLE BACK SURG.'S  . Arthritis   . Asthma    last flare up was 03/2017 lasted over a month  . Blood transfusion   . Cancer ([place])    skin cancers (in scalp)  . Cardiomyopathy HX --06/2010   EF was 25% during acute illness (PHELONEPHRITIS) Repeat echo 12-06-10 60% showed normal EF.   Marland Kitchen Chronic back pain greater than 3 months duration    S/P BACK SURG'S  . CSF leak   .  Diabetes mellitus without complication ([place])    dx 2013 type 2  . Dyslipidemia   . Dysphagia    some post op cerv fusion 2/14  . Dysrhythmia   . Essential hypertension, benign   . Family history of adverse reaction to anesthesia    mother gets n/v  . GERD (gastroesophageal reflux disease) AND HIATIAL HERNIA   CONTROLLED W/ NEXIUM  . Headache(784.0)   . History of chronic bronchitis   . Hx of bladder infections   . Hyperlipidemia    TAKE CHLOSTEROL MEDICATION,04/23/19  . Hypertension   . Neuromuscular disorder (H[facility])    numbness and tingling  . Osteoporosis   . Other malaise and fatigue   . PONV (postoperative nausea and vomiting)   . Shortness of breath   . Spinal headache   . Spinal stenosis, cervical region   . Varicosities    venous  . Weakness of both legs DUE TO ARACHNOIDITIS   OCCASIONAL USES CANE    Past Surgical History:  Procedure Laterality Date  . ABDOMINAL HYSTERECTOMY  1987   W/ BSO  . ANTERIOR CERVICAL DECOMP/DISCECTOMY FUSION N/A 08/09/2012   Procedure: ANTERIOR CERVICAL DECOMPRESSION/DISCECTOMY FUSION 1 LEVEL;  Surgeon: Floyce Stakes, MD;  Location: [facility];  Service: Neurosurgery;  Laterality: N/A;  Cervical four-five  Anterior cervical decompression/diskectomy/fusion  . ANTERIOR FUSION CERVICAL SPINE  2007   C5 -6  . APPENDECTOMY    . APPLICATION OF INTRAOPERATIVE CT SCAN N/A 05/17/2017   Procedure: APPLICATION OF INTRAOPERATIVE CT SCAN;  Surgeon: Erline Levine, MD;  Location: [facility];  Service: Neurosurgery;  Laterality: N/A;  . BACK SURGERY     x 5  . BREAST EXCISIONAL BIOPSY Bilateral    No scar seen   . BREAST SURGERY     x 2 biopsies  . CARPAL TUNNEL RELEASE  RIGHT - 2001  & DEC 2011 W/ BACK SURG.  . CERVICAL DISC SURGERY  2005   C5 - 6  . CHOLECYSTECTOMY  1994  . DORSAL COMPARTMENT RELEASE Right 04/07/2013   Procedure: RIGHT WRIST STS RELEASE;  Surgeon: Schuyler Amor, MD;  Location: [facility];  Service: Orthopedics;   Laterality: Right;  . ENDOVENOUS ABLATION SAPHENOUS VEIN W/ LASER Right 05/29/2019   endovenous laser ablation right greater saphenous vein by Gae Gallop MD   . [place] CSF LEAK  [place] 2011  X2   POST [place]  . FINGER ARTHRODESIS Right 04/07/2013   Procedure: RIGHT INDEX AND RIGHT LONG DISTAL INTERPHALANGEAL JOINT FUSIONS;  Surgeon: Schuyler Amor, MD;  Location: [facility];  Service: Orthopedics;  Laterality: Right;  . FINGER ARTHROPLASTY Right 04/07/2013   Procedure: RIGHT THUMB [place] ARTHROPLASTY;  Surgeon: Schuyler Amor, MD;  Location: [facility];  Service: Orthopedics;  Laterality: Right;  . GANGLION CYST EXCISION Right 04/07/2013   Procedure: RIGHT WRIST MASS EXCISION;  Surgeon: Schuyler Amor, MD;  Location: [facility];  Service: Orthopedics;  Laterality: Right;  . KNEE ARTHROSCOPY  LEFT X3 (LAST ONE 2005)  . KNEE ARTHROSCOPY  05/17/2011   Procedure: ARTHROSCOPY KNEE;  Surgeon: Bradley Ferris III;  Location: [facility];  Service: Orthopedics;  Laterality: Right;  WITH MEDIAL MENISECTOMY AND removal of suprapatella fat lump  . LEFT WRIST TENOSYNECTOMY W/ LEFT THUMB JOINT REPAIR  02-24-10  . LUMBAR FUSION  11-30-10   L4 - 5  . LUMBAR FUSION  2000   L2 - 4  . LUMBAR LAMINECTOMY  DEC 2011   L4 - 5  . POSTERIOR CERVICAL FUSION/FORAMINOTOMY N/A 05/17/2017   Procedure: Cervical five  to Thorastic one  Posterior cervical fusion with lateral mass screws/revision of prior instrumentation;  Surgeon: Erline Levine, MD;  Location: [facility];  Service: Neurosurgery;  Laterality: N/A;  C5 to T1 Posterior cervical fusion with lateral mass screws/revision of prior instrumentation  . TENDON REPAIR  JAN 2010   LEFT INDEX AND LONG FINGERS  . THUMB SURGERY   04/07/2019   RIGHT THUMB  . TUBAL LIGATION       Current Outpatient Medications  Medication Sig Dispense Refill  . albuterol (VENTOLIN HFA) 108 (90  Base) MCG/ACT inhaler INHALE TWO PUFFS EVERY 6 HOURS AS NEEDED FOR WHEEZING OR SHORTNESS OF BREATH 8.5 g 1  . Ascorbic Acid (VITAMIN C) 1000 MG tablet Take 1,000 mg by mouth daily.    Marland Kitchen aspirin EC 81 MG tablet Take 81 mg by mouth at bedtime.     . budesonide-formoterol (SYMBICORT) 160-4.5 MCG/ACT inhaler Inhale 2 puffs into the lungs 2 (two) times daily. 1 Inhaler 5  . Calcium Carbonate-Vit D-Min 1200-1000 MG-UNIT CHEW Chew 1 tablet by mouth daily.     Marland Kitchen  diltiazem (CARDIZEM CD) 240 MG 24 hr capsule Take 240 mg by mouth daily.    Marland Kitchen estradiol (ESTRACE) 2 MG tablet Take 2 mg by mouth daily.     Marland Kitchen levalbuterol (XOPENEX HFA) 45 MCG/ACT inhaler Inhale 1-2 puffs into the lungs every 4 (four) hours as needed. For shortness of breath 1 Inhaler 6  . losartan-hydrochlorothiazide (HYZAAR) 50-12.5 MG tablet Take 1 tablet by mouth daily.    . metFORMIN (GLUCOPHAGE) 500 MG tablet Take 500 mg by mouth 2 (two) times daily with a meal.     . montelukast (SINGULAIR) 10 MG tablet Take 10 mg by mouth at bedtime. Pt takes in the morning.    . nortriptyline (PAMELOR) 25 MG capsule TAKE 1 CAPSULE BY MOUTH AT BEDTIME 30 capsule 2  . Oxycodone HCl 10 MG TABS Take 1 tablet (10 mg total) by mouth 4 (four) times daily as needed. 120 tablet 0  . oxymorphone (OPANA ER) 40 MG 12 hr tablet Take 1 tablet (40 mg total) by mouth 2 (two) times daily. 60 tablet 0  . promethazine (PHENERGAN) 25 MG tablet Take 25 mg by mouth as needed.     . simvastatin (ZOCOR) 10 MG tablet Take 10 mg by mouth daily.    Marland Kitchen topiramate (TOPAMAX) 25 MG tablet Take 1 tablet (25 mg total) by mouth 2 (two) times daily. 60 tablet 2  . valACYclovir (VALTREX) 500 MG tablet Take 500 mg by mouth daily.     Marland Kitchen gabapentin (NEURONTIN) 800 MG tablet Take 1 tablet (800 mg total) by mouth 3 (three) times daily. (Patient taking differently: Take 800 mg by mouth 3 (three) times daily. Pt takes twice daily.) 90 tablet 3  . naloxone (NARCAN) nasal spray 4 mg/0.1 mL Place 1  spray into the nose once as needed (Just in case of Opiod Overdose).     No current facility-administered medications for this visit.   Facility-Administered Medications Ordered in Other Visits  Medication Dose Route Frequency Provider Last Rate Last Admin  . levalbuterol (XOPENEX) nebulizer solution 0.63 mg  0.63 mg Nebulization Once Parrett, Tammy S, NP        Allergies:   Latex, Morphine and related, Aspirin, Biaxin [clarithromycin], Clarithromycin, Codeine, Darvocet [propoxyphene n-acetaminophen], Hydrocodone-acetaminophen, Lortab [hydrocodone-acetaminophen], Percocet [oxycodone-acetaminophen], and Tramadol    Social History:  The patient  reports that she has never smoked. She has never used smokeless tobacco. She reports that she does not drink alcohol and does not use drugs.   Family History:  [name] family history includes Cancer in her father; Diabetes in her brother and mother; Heart disease in her mother; Hypertension in her mother.    ROS:  Please see the history of present illness.   Otherwise, review of systems are positive for back pain, neck pain..   All other systems are reviewed and negative.    PHYSICAL EXAM: VS:  BP (!) 142/92   Pulse 79   Ht 5\' 5"  (1.651 m)   Wt 170 lb 3.2 oz (77.2 kg)   SpO2 96%   BMI 28.32 kg/m  , BMI Body mass index is 28.32 kg/m. GENERAL:  Well appearing HEENT:  Pupils equal round and reactive, fundi not visualized, oral mucosa unremarkable NECK:  No jugular venous distention, waveform within normal limits, carotid upstroke brisk and symmetric, no bruits, no thyromegaly LYMPHATICS:  No cervical, inguinal adenopathy LUNGS:  Clear to auscultation bilaterally BACK:  No CVA tenderness CHEST:  Unremarkable HEART:  PMI not displaced or sustained,S1 and  S2 within normal limits, no S3, no S4, no clicks, no rubs, no murmurs ABD:  Flat, positive bowel sounds normal in frequency in pitch, no bruits, no rebound, no guarding, no midline  pulsatile mass, no hepatomegaly, no splenomegaly EXT:  2 plus pulses throughout, no edema, no cyanosis no clubbing, varicose veins and mild left leg swelling.  SKIN:  No rashes no nodules NEURO:  Cranial nerves II through XII grossly intact, motor grossly intact throughout PSYCH:  Cognitively intact, oriented to person place and time   EKG:  EKG is ordered today. The ekg ordered today demonstrates sinus rhythm, rate 79, axis within normal limits, intervals within normal limits, no acute ST-T wave changes.   Recent Labs: No results found for requested labs within last 8760 hours.    Lipid Panel No results found for: CHOL, TRIG, HDL, CHOLHDL, VLDL, LDLCALC, LDLDIRECT    Wt Readings from Last 3 Encounters:  04/12/20 170 lb 3.2 oz (77.2 kg)  03/03/20 169 lb 3.2 oz (76.7 kg)  01/28/20 171 lb 9.6 oz (77.8 kg)      Other studies Reviewed: Additional studies/ records that were reviewed today include: Old echo. Review of the above records demonstrates:  Please see elsewhere in the note.     ASSESSMENT AND PLAN:  SOB:   The patient has some vague symptoms of tachycardia and some dyspnea.  He has been personal mitral valve prolapse in the past and a reduced ejection fraction documented in the distant past.  I will follow up with an echocardiogram.  This is unremarkable then no further work-up is suggested.  AORTIC ATHEROSCLEROSIS: She needs continued risk reduction.  She has an excellent lipid profile.  Her blood pressure is typically well controlled.  She is not smoking cigarettes.  A1c is 6.0.  No change in therapy.  HTN: Her blood pressure is very slightly elevated today but she has had hypotension and she said "almost died" when her meds were titrated upwards.  She will continue with the meds as listed as she tolerates this regimen.  DYSLIPIDEMIA: Total cholesterol has been 149.  HDL 70.  No change in therapy.  Current medicines are reviewed at length with the patient today.  The  patient does not have concerns regarding medicines.  The following changes have been made:  no change  Labs/ tests ordered today include:   Orders Placed This Encounter  Procedures  . EKG 12-Lead  . ECHOCARDIOGRAM COMPLETE     Disposition:   FU with me as needed based on the results of the above.   Signed, Minus Breeding, MD  04/12/2020 11:46 AM    [place]

## 2020-04-12 ENCOUNTER — Ambulatory Visit (INDEPENDENT_AMBULATORY_CARE_PROVIDER_SITE_OTHER): Payer: Medicare Other | Admitting: Cardiology

## 2020-04-12 ENCOUNTER — Other Ambulatory Visit: Payer: Self-pay

## 2020-04-12 ENCOUNTER — Encounter: Payer: Self-pay | Admitting: Cardiology

## 2020-04-12 ENCOUNTER — Other Ambulatory Visit: Payer: Self-pay | Admitting: *Deleted

## 2020-04-12 VITALS — BP 142/92 | HR 79 | Ht 65.0 in | Wt 170.2 lb

## 2020-04-12 DIAGNOSIS — R42 Dizziness and giddiness: Secondary | ICD-10-CM

## 2020-04-12 DIAGNOSIS — E785 Hyperlipidemia, unspecified: Secondary | ICD-10-CM

## 2020-04-12 DIAGNOSIS — I700 Atherosclerosis of aorta: Secondary | ICD-10-CM

## 2020-04-12 DIAGNOSIS — I341 Nonrheumatic mitral (valve) prolapse: Secondary | ICD-10-CM

## 2020-04-12 DIAGNOSIS — I10 Essential (primary) hypertension: Secondary | ICD-10-CM

## 2020-04-12 DIAGNOSIS — R0602 Shortness of breath: Secondary | ICD-10-CM | POA: Diagnosis not present

## 2020-04-12 NOTE — Patient Instructions (Addendum)
Medication Instructions:  No changes *If you need a refill on your cardiac medications before your next appointment, please call your pharmacy*   Lab Work: None ordered If you have labs (blood work) drawn today and your tests are completely normal, you will receive your results only by:  [place] (if you have MyChart) OR  A paper copy in the mail If you have any lab test that is abnormal or we need to change your treatment, we will call you to review the results.   Testing/Procedures: Your physician has requested that you have an echocardiogram. Echocardiography is a painless test that uses sound waves to create images of your heart. It provides your doctor with information about the size and shape of your heart and how well your hearts chambers and valves are working. This procedure takes approximately one hour. There are no restrictions for this procedure. This test is performed at [address].     Follow-Up: At [facility], you and your health needs are our priority.  As part of our continuing mission to provide you with exceptional heart care, we have created designated Provider Care Teams.  These Care Teams include your primary Cardiologist (physician) and Advanced Practice Providers (APPs -  Physician Assistants and Nurse Practitioners) who all work together to provide you with the care you need, when you need it.  We recommend signing up for the patient portal called "MyChart".  Sign up information is provided on this After Visit Summary.  MyChart is used to connect with patients for Virtual Visits (Telemedicine).  Patients are able to view lab/test results, encounter notes, upcoming appointments, etc.  Non-urgent messages can be sent to your provider as well.   To learn more about what you can do with MyChart, go to [email or web address].    Your next appointment:   Follow up with Dr. Minus Breeding, MD on an as-needed basis   Other Instructions None

## 2020-04-23 ENCOUNTER — Other Ambulatory Visit: Payer: Self-pay | Admitting: Physical Medicine & Rehabilitation

## 2020-04-23 NOTE — Telephone Encounter (Signed)
Refill request for Oxymorphone ER from pharmacy. Called pharmacy and denied refill. Last filled 04/03/2020 30 day supply, next appt 04/30/2020. Patient should not be out of medication before appt.

## 2020-04-23 NOTE — Telephone Encounter (Signed)
PMP was reviewed. Last Opana was filled on 04/03/2020. [facility] was notified of the above. Refill was denied.

## 2020-04-30 ENCOUNTER — Other Ambulatory Visit: Payer: Self-pay

## 2020-04-30 ENCOUNTER — Encounter: Payer: Self-pay | Admitting: Registered Nurse

## 2020-04-30 ENCOUNTER — Encounter: Payer: Medicare Other | Attending: Physical Medicine & Rehabilitation | Admitting: Registered Nurse

## 2020-04-30 VITALS — BP 128/75 | HR 91 | Temp 98.1°F | Ht 65.0 in | Wt 167.4 lb

## 2020-04-30 DIAGNOSIS — M961 Postlaminectomy syndrome, not elsewhere classified: Secondary | ICD-10-CM | POA: Diagnosis not present

## 2020-04-30 DIAGNOSIS — M546 Pain in thoracic spine: Secondary | ICD-10-CM | POA: Diagnosis not present

## 2020-04-30 DIAGNOSIS — M5416 Radiculopathy, lumbar region: Secondary | ICD-10-CM

## 2020-04-30 DIAGNOSIS — Z5181 Encounter for therapeutic drug level monitoring: Secondary | ICD-10-CM | POA: Insufficient documentation

## 2020-04-30 DIAGNOSIS — M542 Cervicalgia: Secondary | ICD-10-CM | POA: Insufficient documentation

## 2020-04-30 DIAGNOSIS — G894 Chronic pain syndrome: Secondary | ICD-10-CM

## 2020-04-30 DIAGNOSIS — R634 Abnormal weight loss: Secondary | ICD-10-CM | POA: Diagnosis not present

## 2020-04-30 DIAGNOSIS — M5412 Radiculopathy, cervical region: Secondary | ICD-10-CM | POA: Diagnosis not present

## 2020-04-30 DIAGNOSIS — G8929 Other chronic pain: Secondary | ICD-10-CM | POA: Diagnosis not present

## 2020-04-30 DIAGNOSIS — Z79891 Long term (current) use of opiate analgesic: Secondary | ICD-10-CM | POA: Diagnosis not present

## 2020-04-30 DIAGNOSIS — G039 Meningitis, unspecified: Secondary | ICD-10-CM | POA: Diagnosis not present

## 2020-04-30 MED ORDER — GABAPENTIN 800 MG PO TABS: 800.0000 mg | ORAL_TABLET | Freq: Three times a day (TID) | ORAL | 3 refills | Status: DC

## 2020-04-30 MED ORDER — OXYCODONE HCL 10 MG PO TABS: 10.0000 mg | ORAL_TABLET | Freq: Four times a day (QID) | ORAL | 0 refills | Status: DC | PRN

## 2020-04-30 MED ORDER — OXYMORPHONE HCL ER 40 MG PO TB12
40.0000 mg | ORAL_TABLET | Freq: Two times a day (BID) | ORAL | 0 refills | Status: DC
Start: 2020-04-30 — End: 2020-05-31

## 2020-04-30 NOTE — Progress Notes (Signed)
Subjective:    Patient ID: [name] [name], female    DOB: 24-Dec-1950, 69 y.o.   MRN: [id number]  HPI: [name] [name] is a 69 y.o. female who returns for follow up appointment for chronic pain and medication refill. She states her pain is located in her neck radiating into her bilateral shoulders and mid- lower back pain radiating into her bilateral lower extremities. She rates her pain 8. Her current exercise regime is walking and performing stretching exercises.  [name] [name] is scheduled for:  With Dr Burney Gauze on 05/24/2020 RIGHT INDEX RIGHT LONG PROXIMAL INTERPHALANGEAL JOINT FUSION Right Monitor Anesthesia Care  ANCILLARY BLOCK      [name] [name] Morphine equivalent is 300.00  MME.  UDS ordered today.   Pain Inventory Average Pain 8 Pain Right Now 8 My pain is sharp, burning, stabbing, tingling and aching  In the last 24 hours, has pain interfered with the following? General activity 7 Relation with others 7 Enjoyment of life 7 What TIME of day is your pain at its worst? morning , daytime, evening and night Sleep (in general) Fair  Pain is worse with: walking, bending, sitting, standing and some activites Pain improves with: rest, heat/ice and medication Relief from Meds: 7  Family History  Problem Relation Age of Onset  . Hypertension Mother   . Heart disease Mother        Mitral valve replacement.   . Diabetes Mother   . Cancer Father   . Diabetes Brother   . Colon cancer Neg Hx   . Colon polyps Neg Hx   . Esophageal cancer Neg Hx   . Rectal cancer Neg Hx   . Stomach cancer Neg Hx    Social History   Socioeconomic History  . Marital status: Divorced    Spouse name: Not on file  . Number of children: Not on file  . Years of education: Not on file  . Highest education level: Not on file  Occupational History  . Occupation: disable    Employer: DISABLED  Tobacco Use  . Smoking status: Never Smoker  . Smokeless tobacco: Never Used  Vaping Use  . Vaping Use: Never  used  Substance and Sexual Activity  . Alcohol use: No  . Drug use: No  . Sexual activity: Not on file    Comment: second hand smoke  Other Topics Concern  . Not on file  Social History Narrative   Has one child lives at home with her.    Social Determinants of Health   Financial Resource Strain:   . Difficulty of Paying Living Expenses: Not on file  Food Insecurity:   . Worried About Charity fundraiser in the Last Year: Not on file  . Ran Out of Food in the Last Year: Not on file  Transportation Needs:   . Lack of Transportation (Medical): Not on file  . Lack of Transportation (Non-Medical): Not on file  Physical Activity:   . Days of Exercise per Week: Not on file  . Minutes of Exercise per Session: Not on file  Stress:   . Feeling of Stress : Not on file  Social Connections:   . Frequency of Communication with Friends and Family: Not on file  . Frequency of Social Gatherings with Friends and Family: Not on file  . Attends Religious Services: Not on file  . Active Member of Clubs or Organizations: Not on file  . Attends Archivist Meetings: Not on file  .  Marital Status: Not on file   Past Surgical History:  Procedure Laterality Date  . ABDOMINAL HYSTERECTOMY  1987   W/ BSO  . ANTERIOR CERVICAL DECOMP/DISCECTOMY FUSION N/A 08/09/2012   Procedure: ANTERIOR CERVICAL DECOMPRESSION/DISCECTOMY FUSION 1 LEVEL;  Surgeon: Floyce Stakes, MD;  Location: [facility];  Service: Neurosurgery;  Laterality: N/A;  Cervical four-five  Anterior cervical decompression/diskectomy/fusion  . ANTERIOR FUSION CERVICAL SPINE  2007   C5 -6  . APPENDECTOMY    . APPLICATION OF INTRAOPERATIVE CT SCAN N/A 05/17/2017   Procedure: APPLICATION OF INTRAOPERATIVE CT SCAN;  Surgeon: Erline Levine, MD;  Location: [place];  Service: Neurosurgery;  Laterality: N/A;  . BACK SURGERY     x 5  . BREAST EXCISIONAL BIOPSY Bilateral    No scar seen   . BREAST SURGERY     x 2 biopsies  . CARPAL  TUNNEL RELEASE  RIGHT - 2001  & DEC 2011 W/ BACK SURG.  . CERVICAL DISC SURGERY  2005   C5 - 6  . CHOLECYSTECTOMY  1994  . DORSAL COMPARTMENT RELEASE Right 04/07/2013   Procedure: RIGHT WRIST STS RELEASE;  Surgeon: Schuyler Amor, MD;  Location: [place];  Service: Orthopedics;  Laterality: Right;  . ENDOVENOUS ABLATION SAPHENOUS VEIN W/ LASER Right 05/29/2019   endovenous laser ablation right greater saphenous vein by Gae Gallop MD   . [place] CSF LEAK  [place] 2011  X2   POST [place]  . FINGER ARTHRODESIS Right 04/07/2013   Procedure: RIGHT INDEX AND RIGHT LONG DISTAL INTERPHALANGEAL JOINT FUSIONS;  Surgeon: Schuyler Amor, MD;  Location: [place];  Service: Orthopedics;  Laterality: Right;  . FINGER ARTHROPLASTY Right 04/07/2013   Procedure: RIGHT THUMB [place] ARTHROPLASTY;  Surgeon: Schuyler Amor, MD;  Location: [place];  Service: Orthopedics;  Laterality: Right;  . GANGLION CYST EXCISION Right 04/07/2013   Procedure: RIGHT WRIST MASS EXCISION;  Surgeon: Schuyler Amor, MD;  Location: [place];  Service: Orthopedics;  Laterality: Right;  . KNEE ARTHROSCOPY  LEFT X3 (LAST ONE 2005)  . KNEE ARTHROSCOPY  05/17/2011   Procedure: ARTHROSCOPY KNEE;  Surgeon: Bradley Ferris III;  Location: [place];  Service: Orthopedics;  Laterality: Right;  WITH MEDIAL MENISECTOMY AND removal of suprapatella fat lump  . LEFT WRIST TENOSYNECTOMY W/ LEFT THUMB JOINT REPAIR  02-24-10  . LUMBAR FUSION  11-30-10   L4 - 5  . LUMBAR FUSION  2000   L2 - 4  . LUMBAR LAMINECTOMY  DEC 2011   L4 - 5  . POSTERIOR CERVICAL FUSION/FORAMINOTOMY N/A 05/17/2017   Procedure: Cervical five  to Thorastic one  Posterior cervical fusion with lateral mass screws/revision of prior instrumentation;  Surgeon: Erline Levine, MD;  Location: [place];  Service: Neurosurgery;  Laterality: N/A;  C5 to T1 Posterior  cervical fusion with lateral mass screws/revision of prior instrumentation  . TENDON REPAIR  JAN 2010   LEFT INDEX AND LONG FINGERS  . THUMB SURGERY   04/07/2019   RIGHT THUMB  . TUBAL LIGATION     Past Surgical History:  Procedure Laterality Date  . ABDOMINAL HYSTERECTOMY  1987   W/ BSO  . ANTERIOR CERVICAL DECOMP/DISCECTOMY FUSION N/A 08/09/2012   Procedure: ANTERIOR CERVICAL DECOMPRESSION/DISCECTOMY FUSION 1 LEVEL;  Surgeon: Floyce Stakes, MD;  [facility];  Service: Neurosurgery;  Laterality: N/A;  Cervical four-five  Anterior cervical decompression/diskectomy/fusion  . ANTERIOR  FUSION CERVICAL SPINE  2007   C5 -6  . APPENDECTOMY    . APPLICATION OF INTRAOPERATIVE CT SCAN N/A 05/17/2017   Procedure: APPLICATION OF INTRAOPERATIVE CT SCAN;  Surgeon: Erline Levine, MD;  Location: [place];  Service: Neurosurgery;  Laterality: N/A;  . BACK SURGERY     x 5  . BREAST EXCISIONAL BIOPSY Bilateral    No scar seen   . BREAST SURGERY     x 2 biopsies  . CARPAL TUNNEL RELEASE  RIGHT - 2001  & DEC 2011 W/ BACK SURG.  . CERVICAL DISC SURGERY  2005   C5 - 6  . CHOLECYSTECTOMY  1994  . DORSAL COMPARTMENT RELEASE Right 04/07/2013   Procedure: RIGHT WRIST STS RELEASE;  Surgeon: Schuyler Amor, MD;  Location: [place];  Service: Orthopedics;  Laterality: Right;  . ENDOVENOUS ABLATION SAPHENOUS VEIN W/ LASER Right 05/29/2019   endovenous laser ablation right greater saphenous vein by Gae Gallop MD   . [place] CSF LEAK  [place] 2011  X2   POST [place]  . FINGER ARTHRODESIS Right 04/07/2013   Procedure: RIGHT INDEX AND RIGHT LONG DISTAL INTERPHALANGEAL JOINT FUSIONS;  Surgeon: Schuyler Amor, MD;  Location: [place];  Service: Orthopedics;  Laterality: Right;  . FINGER ARTHROPLASTY Right 04/07/2013   Procedure: RIGHT THUMB [place] ARTHROPLASTY;  Surgeon: Schuyler Amor, MD;  Location: [place];  Service:  Orthopedics;  Laterality: Right;  . GANGLION CYST EXCISION Right 04/07/2013   Procedure: RIGHT WRIST MASS EXCISION;  Surgeon: Schuyler Amor, MD;  Location: [place];  Service: Orthopedics;  Laterality: Right;  . KNEE ARTHROSCOPY  LEFT X3 (LAST ONE 2005)  . KNEE ARTHROSCOPY  05/17/2011   Procedure: ARTHROSCOPY KNEE;  Surgeon: Bradley Ferris III;  Location: [place];  Service: Orthopedics;  Laterality: Right;  WITH MEDIAL MENISECTOMY AND removal of suprapatella fat lump  . LEFT WRIST TENOSYNECTOMY W/ LEFT THUMB JOINT REPAIR  02-24-10  . LUMBAR FUSION  11-30-10   L4 - 5  . LUMBAR FUSION  2000   L2 - 4  . LUMBAR LAMINECTOMY  DEC 2011   L4 - 5  . POSTERIOR CERVICAL FUSION/FORAMINOTOMY N/A 05/17/2017   Procedure: Cervical five  to Thorastic one  Posterior cervical fusion with lateral mass screws/revision of prior instrumentation;  Surgeon: Erline Levine, MD;  Location: [place];  Service: Neurosurgery;  Laterality: N/A;  C5 to T1 Posterior cervical fusion with lateral mass screws/revision of prior instrumentation  . TENDON REPAIR  JAN 2010   LEFT INDEX AND LONG FINGERS  . THUMB SURGERY   04/07/2019   RIGHT THUMB  . TUBAL LIGATION     Past Medical History:  Diagnosis Date  . Acute sinusitis, unspecified   . Allergy   . Anemia    "quite a few times"  . Anxiety state, unspecified   . Arachnoiditis BILATERAL LEGS   DUE TO MULTIPLE BACK SURG.'S  . Arthritis   . Asthma    last flare up was 03/2017 lasted over a month  . Blood transfusion   . Cancer ([place])    skin cancers (in scalp)  . Cardiomyopathy HX --06/2010   EF was 25% during acute illness (PHELONEPHRITIS) Repeat echo 12-06-10 60% showed normal EF.   Marland Kitchen Chronic back pain greater than 3 months duration    S/P BACK SURG'S  . CSF leak   . Diabetes mellitus without complication ([place])  dx 2013 type 2  . Dyslipidemia   . Dysphagia    some post op cerv fusion 2/14  . Dysrhythmia   . Essential  hypertension, benign   . Family history of adverse reaction to anesthesia    mother gets n/v  . GERD (gastroesophageal reflux disease) AND HIATIAL HERNIA   CONTROLLED W/ NEXIUM  . Headache(784.0)   . History of chronic bronchitis   . Hx of bladder infections   . Hyperlipidemia    TAKE CHLOSTEROL MEDICATION,04/23/19  . Hypertension   . Neuromuscular disorder (H[facility])    numbness and tingling  . Osteoporosis   . Other malaise and fatigue   . PONV (postoperative nausea and vomiting)   . Shortness of breath   . Spinal headache   . Spinal stenosis, cervical region   . Varicosities    venous  . Weakness of both legs DUE TO ARACHNOIDITIS   OCCASIONAL USES CANE   BP 128/75   Pulse 91   Temp 98.1 F (36.7 C)   Ht 5\' 5"  (1.651 m)   Wt 167 lb 6.4 oz (75.9 kg)   SpO2 98%   BMI 27.86 kg/m   Opioid Risk Score:   Fall Risk Score:  `1  Depression screen PHQ 2/9  Depression screen [facility] 2/9 03/03/2020 01/02/2020 10/13/2019 06/25/2019 10/30/2018 08/14/2018 03/12/2018  Decreased Interest 0 0 0 0 0 0 0  Down, Depressed, Hopeless 0 0 0 0 0 - 0  PHQ - 2 Score 0 0 0 0 0 0 0  Altered sleeping - - - - - - -  Tired, decreased energy - - - - - - -  Change in appetite - - - - - - -  Feeling bad or failure about yourself  - - - - - - -  Trouble concentrating - - - - - - -  Moving slowly or fidgety/restless - - - - - - -  Suicidal thoughts - - - - - - -  PHQ-9 Score - - - - - - -  Some recent data might be hidden     Review of Systems     Objective:   Physical Exam Vitals and nursing note reviewed.  Constitutional:      Appearance: Normal appearance.  Neck:     Comments: Cervical Paraspinal Tenderness: C-5-C-6 Cardiovascular:     Rate and Rhythm: Normal rate and regular rhythm.     Pulses: Normal pulses.     Heart sounds: Normal heart sounds.  Pulmonary:     Effort: Pulmonary effort is normal.     Breath sounds: Normal breath sounds.  Musculoskeletal:     Cervical back: Normal range of  motion and neck supple.     Comments: Normal Muscle Bulk and Muscle Testing Reveals:  Upper Extremities: Full ROM and Muscle Strength on Right 4/5 and Left 5/5 Bilateral AC Joint Tenderness Thoracic Paraspinal Tenderness: T-7-T-9 Lumbar Paraspinal Tenderness: L-3-L-5 Lower Extremities: Full ROM and Muscle Strength 5/5 Arises from Table with ease Narrow Based Gait   Skin:    General: Skin is warm and dry.  Neurological:     Mental Status: She is alert and oriented to person, place, and time.  Psychiatric:        Mood and Affect: Mood normal.        Behavior: Behavior normal.           Assessment & Plan:  1. On 05/17/2017 :C5C6C7 T1 Posterior Cervical Fusion with lateral mass fixation with  [facility], revision of prior instrumentation. By Dr. Vertell Limber.  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 Continuecurrent medication regimen withGabapentin800 mgBID.Refilled:Oxycodone 10mg  one tablet every 6 hours as needed #120tabletsand Oxymorphone HCL 40 mg every 12 hours #60.04/30/2020 We will continue the opioid monitoring program, this consists of regular clinic visits, examinations, urine drug screen, pill counts as well as use of [place] Controlled Substance Reporting system. 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 Anxiety/depression: PCP Following. Continueto monitor.04/30/2020 4. Muscle Spasms: Continuecurrent medication regime withFlexeril. Continue to Monitor.04/30/2020 5. Cervicalgia/ Cervical Radiculitis: Dr Vertell Limber Following. ,Continuecurrent medication regime withGabapentin:  S/P C5C6C7 T1 Posterior Cervical Fusion with lateral mass fixation with [facility], revision of prior instrumentation by Dr. Vertell Limber  on 05/17/2017.04/30/2020 7. Bilateral Thoracic Back Pain:Continue HEP as Tolerated and Continue current medication regimen. Continue to Monitor.04/30/2020 8. Left lower extremity DVT/ Phlebitis: S/P endovenous laser ablation of left great saphenous vein on 05/09/2018 by Dr. Scot Dock: Vascular Following.04/30/2020. 9. Insomnia:ContinuePamelor. Continue to Monitor.04/30/2020. 10. Weight Loss: Ms. Mensinger states PCP Following. Continue to monitor.   F/U in 1 month  56minutes of face to face patient care time was spent during this visit. All questions were encouraged and answered.

## 2020-05-05 LAB — TOXASSURE SELECT,+ANTIDEPR,UR

## 2020-05-06 ENCOUNTER — Telehealth: Payer: Self-pay | Admitting: *Deleted

## 2020-05-06 NOTE — Telephone Encounter (Signed)
Urine drug screen for this encounter is consistent for prescribed medication 

## 2020-05-07 ENCOUNTER — Ambulatory Visit: Payer: Medicare Other

## 2020-05-07 ENCOUNTER — Ambulatory Visit (HOSPITAL_COMMUNITY): Payer: Medicare Other | Attending: Internal Medicine

## 2020-05-07 ENCOUNTER — Other Ambulatory Visit: Payer: Self-pay

## 2020-05-07 DIAGNOSIS — I341 Nonrheumatic mitral (valve) prolapse: Secondary | ICD-10-CM | POA: Diagnosis not present

## 2020-05-07 LAB — ECHOCARDIOGRAM COMPLETE
Area-P 1/2: 2.82 cm2
S' Lateral: 3.7 cm

## 2020-05-10 DIAGNOSIS — I10 Essential (primary) hypertension: Secondary | ICD-10-CM | POA: Diagnosis not present

## 2020-05-10 DIAGNOSIS — E1143 Type 2 diabetes mellitus with diabetic autonomic (poly)neuropathy: Secondary | ICD-10-CM | POA: Diagnosis not present

## 2020-05-10 DIAGNOSIS — E782 Mixed hyperlipidemia: Secondary | ICD-10-CM | POA: Diagnosis not present

## 2020-05-12 ENCOUNTER — Ambulatory Visit: Payer: Medicare Other | Admitting: Vascular Surgery

## 2020-05-12 ENCOUNTER — Encounter (HOSPITAL_COMMUNITY): Payer: Medicare Other

## 2020-05-18 ENCOUNTER — Ambulatory Visit (INDEPENDENT_AMBULATORY_CARE_PROVIDER_SITE_OTHER): Payer: Medicare Other | Admitting: Internal Medicine

## 2020-05-18 ENCOUNTER — Other Ambulatory Visit: Payer: Self-pay

## 2020-05-18 ENCOUNTER — Encounter: Payer: Self-pay | Admitting: Internal Medicine

## 2020-05-18 VITALS — BP 132/82 | HR 94 | Temp 98.3°F | Ht 65.0 in | Wt 165.5 lb

## 2020-05-18 DIAGNOSIS — R918 Other nonspecific abnormal finding of lung field: Secondary | ICD-10-CM

## 2020-05-18 DIAGNOSIS — J4530 Mild persistent asthma, uncomplicated: Secondary | ICD-10-CM | POA: Diagnosis not present

## 2020-05-18 DIAGNOSIS — R053 Chronic cough: Secondary | ICD-10-CM

## 2020-05-18 DIAGNOSIS — J45901 Unspecified asthma with (acute) exacerbation: Secondary | ICD-10-CM

## 2020-05-18 LAB — POCT EXHALED NITRIC OXIDE: FeNO level (ppb): 29

## 2020-05-18 MED ORDER — DOXYCYCLINE HYCLATE 100 MG PO TABS: 100.0000 mg | ORAL_TABLET | Freq: Two times a day (BID) | ORAL | 0 refills | Status: DC

## 2020-05-18 MED ORDER — PREDNISONE 10 MG (21) PO TBPK: ORAL_TABLET | Freq: Every day | ORAL | 0 refills | Status: DC

## 2020-05-18 NOTE — Patient Instructions (Addendum)
ICD-10-CM   1. Mild persistent asthma, uncomplicated  Z61.09 POCT EXHALED NITRIC OXIDE  2. Moderate asthma with exacerbation, unspecified whether persistent  J45.901   3. Chronic cough  R05.3   4. Multiple lung nodules  R91.8    Nitric oxide test normal No wheezing BUt quite symptoamatic c/w asthma flare up ATypical chest pain presnt Previous nodules benign but apppreciate your concern to re-[place]  Plan Take doxycycline 100mg  po twice daily x 5 days; take after meals and avoid sunlight  Take prednisone 40 mg daily x 2 days, then 20mg  daily x 2 days, then 10mg  daily x 2 days, then 5mg  daily x 2 days and stop  Continue symbicort  Continue singulair  Use albuterol as needed  Get CT chest wo contrast any time  Followup  3-6 months or sooner if needed

## 2020-05-18 NOTE — Progress Notes (Signed)
OV 04/18/2016  Chief Complaint  Patient presents with  . Follow-up    Pt states voice is raspy. Pt c/o of some congestion more in morning, no tightness, and SOB w/ and w/o excertions. Pt states cough comes and goes.     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 In interim has seen others. In [place] at beach s/p syncope and near cardiac arrest due to dehydratiin per her hx. Since then not the same. Last week having right infrascapular pain that gets worse when she lifts her hand. Also last week or so increased hoarseness of voice is increased cough and wheezing production.She is demanding to have arepeat CT scan of chest for her 38mm nodules.She is extremely worried about cancer.   OV 02/27/2017  Chief Complaint  Patient presents with  . Follow-up    6 month f/u, moderate asthma, been doing ok but started wheezing at night when she lays down, allergies causing congestion    Follow-up asthma based on methacholine challenge test positivity and chronic hoarseness of voice  Last seen October 2017. She says for the last 7-8 months she's been having nocturnal wheezing that is waking up at night. But in the daytime she does not have any wheezing. She is also also having dysphagia that is chronic. She was referred to ENT particularly in the context of having to have C-spine surgery. Apparently her  Vocal cords were inflamed and he was then asked to take double dose of Protonix which seemed to help the cough but not fully. This is frustrating her. She struggling with understanding the pathophysiology of this. She's taking Symbicort and singulair regularly. She has lost 15 pounds of weight in the last few weeks intentionally according to her and so she does not think acid reflux is a problem.    asthma control questionnaire shows that at night she is waking up a few times because of perceived asthma symptoms.  When she wakes up she is asymptomatic and she is only very slightly limited in her activities by asthma. She is only experiencing very little shortness of breath because of asthma. She is wheezing only a little of the time at night but is using a lot of albuterol for rescue. 5 point average score is 1  feno is 25 ppb and normal 02/27/2017  She is asking for =- new issue of pre op clearance prior to C spine surgery next month   OV 08/30/2017   Chief Complaint  Patient presents with  . Follow-up    Pt states she was in the hospital February 13 in [place] due to pna.  Pt does have current complaints of cough, fatigue, hoarseness, and chest tightness. States she is unsure if she is fully over the pna.      Follow-up asthma based on methacholine challenge test positivity and chronic hoarseness of voice  69 year old female follow-up asthma based on methacholine challenge test and chronic hoarseness of voice.  Last seen fall 2018.  After that she tells me that in February 2019 she got hospitalized after suddenly taking ill.  She shows a blood gas results from the outside showing hypoxemia without hypercapnia.  She was not intubated or treated with BiPAP.  She was treated in the medical floor with oxygen and antibiotics.  She says she had hospital-acquired delirium and was diagnosed with right upper lobe pneumonia.  She was then discharged  after a few days.  Since then she is improving slowly but having significant fatigue review of the lab results from [facility] also shows anemia but otherwise chemistries were normal.  I do not have a chest x-ray for my visualization.  Now she tells me that despite being on inhalers for the last 2 days she is having worsening cough that is dry with white mucus no wheezing no orthopnea no proximal nocturnal dyspnea no fever.  She is worried about the cough.     Walking desaturation test on 08/30/2017 185 feet x 3 laps on ROOM AIR:  did not desaturate. Waklked at slow  moderate pace. Rest pulse ox was 99%, final pulse ox was 94%. HR response 96/min at rest to 101/min at peak exertion. Patient KIMBERLIE CSASZAR  Did not Desaturate < 88% . Jaylyne Breese [place] yes did  Desaturated </= 3% points. IDIL MASLANKA yes did get tachyardic. Got fatigued but only mildly     OV 03/28/2018  Subjective:  Patient ID: Syliva Overman, female , DOB: 11/17/1950 , age 69 y.o. , MRN: [id number] , ADDRESS: [place] [id number]   03/28/2018 -   Chief Complaint  Patient presents with  . Follow-up    still not feeling well- retaining fluid in leg - refused weight    Follow-up asthma based on methacholine challenge test positivity and chronic hoarseness of voice  HPI JUDYTH DEMARAIS 69 y.o. -presents for routine follow-up.  She tells me that through the summer 2019 she has had 2 course of antibiotics and through a full course of prednisone for respiratory exacerbation.  She says she has chronic shortness of breath with exertion relieved by rest without associated chest pain.  She feels that the course is been progressive.  Moderate in intensity occasionally severe.  In addition she has nocturnal wheezing that is waking her up.  This despite using Xopenex and Entocort which seemed to help her.  In addition for the last 8 months or so she is reporting left lower extremity edema that is worse than the right side.  She has tried compression stockings without relief.  This is new report to me.  She says primary care is not referring her to cardiology.  She thinks that overall she is volume overloaded because of the symptom.  She had a stress test several years ago that was normal.  In addition is also reporting chronic hoarseness of voice that is worse.  Last visit with ENT was with Dr. Redmond Baseman approximately 1 year ago.  She still has not followed up with him.  Exam nitric oxide today is 13 ppb and normal asthma control questionnaire with significant amount of symptomatology.  ROS - per  HPI     OV 07/18/2018  Subjective:  Patient ID: Syliva Overman, female , DOB: 08/07/1950 , age 69 y.o. , MRN: [id number] , ADDRESS: [id number] [place] [address]   07/18/2018 -   Chief Complaint  Patient presents with  . Follow-up    Pt states she is still coughing with thick phlegm and is also hoarse due to her husband walking past her smoking a cigarette. Pt also states she is fatigued, dizzy, and has lost her taste and due to that is not eating that much. Pt states she has to use her xopenex inhaler about 4 times daily.   Follow-up asthma based on methacholine challenge test positivity and tracheobronchomalacia on CT OCt 2019 Fpollowup chronic hoarseness of voice  Foollowup chronic benign nodule oct 2019  HPI LANISSA CASHEN 69 y.o. -returns for follow-up.  Last seen by myself in October 2019.  After that she has had 3 visits with our nurse practitioners.  Each time for worsening respiratory symptoms and hoarseness of voice.  Each time getting prednisone.  Today she returns and has significant amount of respiratory symptoms.  However she is categorical that these are not changed in the last few weeks of few days in several days.  She says they are all stable.  But the symptom level burden is extremely high.  She is waking up several times at night.  When she wakes up she has mild symptoms.  She is moderately limited in her activities.  She has a little shortness of breath.  Wheezing moderate amount of the time and using albuterol for rescue multiple times daily.  This is despite being on chronic stable inhalers.  Of note, her CT scan in October 2019 showed tracheobronchomalacia.  So we sent her back to Dr. Redmond Baseman in ENT.  She says she has seen him.  I do not have the report with me.  Apparently the vocal cords were red.  Only supportive care advised.  In addition now she is having a new complaint of chronic dysphagia for the last 1 year.  And that she get stuck with mashed potato.  Her  last GI evaluation was 10 years ago in [place].  At that time she did not have these current problems.  She is willing to see GI at this point.  She is also open to having a tertiary care referral.     Results for SHERICE, IJAMES (MRN [id number])  Ref. Range 12/24/2014 16:53 08/30/2017  07/18/2018   Nitric Oxide Unknown 27 Could not perform 14 ppb   IMPRESSION: 1. No findings to suggest interstitial lung disease. 2. Moderate air trapping indicative of small airways disease. 3. 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 4. Innumerable tiny 1-2 mm pulmonary nodules scattered throughout the lungs bilaterally, stable dating back to 2017, considered definitively benign. 5. Hepatic steatosis.   Electronically Signed   By: Vinnie Langton M.D.   On: 04/12/2018 11:09  ROS - per HPI   OV 04/01/2019  Subjective:  Patient ID: Syliva Overman, female , DOB: 11-Jun-1951 , age 69 y.o. , MRN: [id number] , ADDRESS: [place] [id number]   04/01/2019 -   Chief Complaint  Patient presents with  . Follow-up    Pt states overall she feels her breathing is unchanged since last OV in 12/2018. However, pt c/o desats to high 80's when lyning down for bed, pt doesnt lay flat. Pt also c/o daily nausea and a couple episodes of vomiting in the last 2 weeks. Pt states she has lost 6 lbs over the last couple weeks. Pt denies cough, CP/tightness and f/c/s.    Follow-up asthma based on methacholine challenge test positivity and tracheobronchomalacia on CT OCt 2019 Fpollowup chronic hoarseness of voice with associated red vocal cords per history of examination by Dr. Redmond Baseman ENT 2019 Foollowup chronic benign nodule oct 2019  HPI MEREDYTH HORNUNG 69 y.o. -returns for follow-up.  Last seen January 2020 and then lost to follow-up because of the pandemic.  In the interim in June 2020 she got admitted for hypercapnia at [facility].  She was discharged a day later.  Never got intubated.  Apparently  she responded to Narcan.  Heavy pain medication opioid use was documented but she says  this was not a overdose admission.  She refuses to have blood gas test again.  Currently overall she is feeling well.  However she is lost 6 pounds of weight unintentionally.  And she is having intermittent nausea and vomiting of moderate intensity.  Review of medication shows polypharmacy associated with multiple opioid medications but she does not think this is the reason.  She is worried about malignancy.  She is asking for a CT scan of the chest for GI symptoms.  Most recent CT scan of the chest was in October 2019 and other than chronic stable benign nodules no ILD or other findings.  Of note she is also having dysphagia that is chronic.  I referred her to GI in early 2020 but because of the pandemic this did not happen.  She is willing to get referred to GI again.  She also thinks her pulse ox drops at night.  07/29/2019 Follow up: Asthma  Patient presents for a 1 month follow-up.  Patient was seen last visit for an asthmatic bronchitic exacerbation.  She was treated with Augmentin and a prednisone taper.  Her Symbicort was increased from 80-160.  Patient says she is starting to feel better.  She has decreased cough and wheezing.  Unfortunately she did not pick up her Symbicort because pharmacist did not have it.  Patient denies any chest pain orthopnea PND or increased leg swelling.   ROS - per HPI   OV 11/11/2019  Subjective:  Patient ID: Syliva Overman, female , DOB: 1951/03/22 , age 8 y.o. , MRN: [id number] , ADDRESS: [address] [place] [address]   Follow-up asthma based on methacholine challenge test positivity and tracheobronchomalacia on CT OCt 2019 Fpollowup chronic hoarseness of voice with associated red vocal cords per history of examination by Dr. Redmond Baseman ENT 2019 Foollowup chronic benign nodule oct 2019 -3 mm Follow-up chronic cough  11/11/2019 -   Chief Complaint  Patient presents with  .  Follow-up     HPI ROMEKA SCIFRES 69 y.o. -returns for follow-up.  Last seen by myself in October 2020 last seen by nurse practitioner February 2021.  Nurse practitioner picked up on the fact she was on Fosamax.  She asked the patient to stop it but patient has not stopped it because of fear of osteoporosis.  She says she tried alternative Boniva but does not know why this was changed to Fosamax.  She is willing to try another alternative because of acid reflux.  Last visit I referred her to GI but she no showed with Dr. Lucio Edward but she says this was because of personal issues with Dr. Fuller Plan will not follow her anymore.  She says at this point she just wants to try stopping the Fosamax and seeing what her symptoms do.  She says she continues to have significant acid reflux and cough and hoarseness.  In the last 1 month she thinks asthma is affected a little of the time and she is been short of breath once or twice a week.  She is also used her rescue nebulizer inhaler 2-3 times per week but she believes her asthma to be well controlled.  She is some amount of social stress because her mom is ill and she is having to take her mom to a different doctor visits.  Also she wants switch from her Xopenex to albuterol inhaler because of insurance reasons.  She wants to have a repeat CT scan of the chest if her GI  symptoms have not resolved especially her cough particularly after stopping the Fosamax.    After completing the office visit.  She call me back again.  She had several questions about Covid vaccine.  She is scared to get it because of the side effects.  She says all her doctors have recommended she get it.  She said previous vaccines without any problems.  She does not have any allergy to injectables or vaccines.  She wanted to know the mechanism of action and the ingredients in the vaccine.    OV 05/18/2020  Subjective:  Patient ID: Syliva Overman, female , DOB: 1951/06/11 , age 55 y.o. , MRN:  [id number] , ADDRESS: [place] [address] PCP Neale Burly, MD Patient Care Team: Neale Burly, MD as PCP - General (Unknown Physician Specialty)  This Provider for this visit: Treatment Team:  Attending Provider: Brand Males, MD    05/18/2020 -   Chief Complaint  Patient presents with  . Follow-up    Asthma, having back pain and more SOB and cough at night    Follow-up asthma based on methacholine challenge test positivity and tracheobronchomalacia on CT OCt 2019 Fpollowup chronic hoarseness of voice with associated red vocal cords per history of examination by Dr. Redmond Baseman ENT 2019 Foollowup chronic benign nodule oct 2019 -3 mm Follow-up chronic cough  Last CT scan of the chest October 2019  HPI GAEL DELUDE 69 y.o. -last visit May 2021.  After that overall she has been doing well.  She was able to get the Covid vaccine.  She says for the last few weeks she has been having some congestion with on and off coughing and discolored mucus production.  She feels she needs antibiotic and prednisone.  In addition she says that she has lower rib pain coming back.  Therefore she is asking for repeat CT chest.  Last one was over 2 years ago.  She stopped Fosamax and is unclear if this is actually helped her respiratory symptoms.  Based on symptomatology her ACT score is eight showing significant symptoms.  We do not have a baseline on that.  Previously nitric oxide normal.  Today on repeat ius 29 and in grey zone.  She is interested in getting prednisone antibiotic.  In addition she is asking for CT scan because of previous history of nodules and has been over 2 years since she had a CT chest.  She is certainly nervous about it.     Asthma Control Test ACT Total Score  05/18/2020 8      Lab Results  Component Value Date   NITRICOXIDE 14 07/18/2018     ROS - per HPI     has a past medical history of Acute sinusitis, unspecified, Allergy, Anemia, Anxiety  state, unspecified, Arachnoiditis (BILATERAL LEGS), Arthritis, Asthma, Blood transfusion, Cancer ([place]), Cardiomyopathy (HX --06/2010), Chronic back pain greater than 3 months duration, CSF leak, Diabetes mellitus without complication ([place]), Dyslipidemia, Dysphagia, Dysrhythmia, Essential hypertension, benign, Family history of adverse reaction to anesthesia, GERD (gastroesophageal reflux disease) (AND HIATIAL HERNIA), Headache(784.0), History of chronic bronchitis, bladder infections, Hyperlipidemia, Hypertension, Neuromuscular disorder ([place]), Osteoporosis, Other malaise and fatigue, PONV (postoperative nausea and vomiting), Shortness of breath, Spinal headache, Spinal stenosis, cervical region, Varicosities, and Weakness of both legs (DUE TO ARACHNOIDITIS).   reports that she has never smoked. She has never used smokeless tobacco.  Past Surgical History:  Procedure Laterality Date  . ABDOMINAL HYSTERECTOMY  1987   W/  BSO  . ANTERIOR CERVICAL DECOMP/DISCECTOMY FUSION N/A 08/09/2012   Procedure: ANTERIOR CERVICAL DECOMPRESSION/DISCECTOMY FUSION 1 LEVEL;  Surgeon: Floyce Stakes, MD;  Location: [facility];  Service: Neurosurgery;  Laterality: N/A;  Cervical four-five  Anterior cervical decompression/diskectomy/fusion  . ANTERIOR FUSION CERVICAL SPINE  2007   C5 -6  . APPENDECTOMY    . APPLICATION OF INTRAOPERATIVE CT SCAN N/A 05/17/2017   Procedure: APPLICATION OF INTRAOPERATIVE CT SCAN;  Surgeon: Erline Levine, MD;  Location: [facility];  Service: Neurosurgery;  Laterality: N/A;  . BACK SURGERY     x 5  . BREAST EXCISIONAL BIOPSY Bilateral    No scar seen   . BREAST SURGERY     x 2 biopsies  . CARPAL TUNNEL RELEASE  RIGHT - 2001  & DEC 2011 W/ BACK SURG.  . CERVICAL DISC SURGERY  2005   C5 - 6  . CHOLECYSTECTOMY  1994  . DORSAL COMPARTMENT RELEASE Right 04/07/2013   Procedure: RIGHT WRIST STS RELEASE;  Surgeon: Schuyler Amor, MD;  Location: [facility];  Service:  Orthopedics;  Laterality: Right;  . ENDOVENOUS ABLATION SAPHENOUS VEIN W/ LASER Right 05/29/2019   endovenous laser ablation right greater saphenous vein by Gae Gallop MD   . [place] CSF LEAK  [place] 2011  X2   POST [place]  . FINGER ARTHRODESIS Right 04/07/2013   Procedure: RIGHT INDEX AND RIGHT LONG DISTAL INTERPHALANGEAL JOINT FUSIONS;  Surgeon: Schuyler Amor, MD;  Location: [facility];  Service: Orthopedics;  Laterality: Right;  . FINGER ARTHROPLASTY Right 04/07/2013   Procedure: RIGHT THUMB [place] ARTHROPLASTY;  Surgeon: Schuyler Amor, MD;  Location: [facility];  Service: Orthopedics;  Laterality: Right;  . GANGLION CYST EXCISION Right 04/07/2013   Procedure: RIGHT WRIST MASS EXCISION;  Surgeon: Schuyler Amor, MD;  Location: [facility];  Service: Orthopedics;  Laterality: Right;  . KNEE ARTHROSCOPY  LEFT X3 (LAST ONE 2005)  . KNEE ARTHROSCOPY  05/17/2011   Procedure: ARTHROSCOPY KNEE;  Surgeon: Bradley Ferris III;  Location: [facility];  Service: Orthopedics;  Laterality: Right;  WITH MEDIAL MENISECTOMY AND removal of suprapatella fat lump  . LEFT WRIST TENOSYNECTOMY W/ LEFT THUMB JOINT REPAIR  02-24-10  . LUMBAR FUSION  11-30-10   L4 - 5  . LUMBAR FUSION  2000   L2 - 4  . LUMBAR LAMINECTOMY  DEC 2011   L4 - 5  . POSTERIOR CERVICAL FUSION/FORAMINOTOMY N/A 05/17/2017   Procedure: Cervical five  to Thorastic one  Posterior cervical fusion with lateral mass screws/revision of prior instrumentation;  Surgeon: Erline Levine, MD;  Location: [facility];  Service: Neurosurgery;  Laterality: N/A;  C5 to T1 Posterior cervical fusion with lateral mass screws/revision of prior instrumentation  . TENDON REPAIR  JAN 2010   LEFT INDEX AND LONG FINGERS  . THUMB SURGERY   04/07/2019   RIGHT THUMB  . TUBAL LIGATION      Allergies  Allergen Reactions  . Latex Itching and Rash  . Morphine And Related Hives  and Itching  . Aspirin Nausea And Vomiting    Can take coated asa  . Biaxin [Clarithromycin] Nausea And Vomiting  . Clarithromycin Diarrhea  . Codeine Itching  . Darvocet [Propoxyphene N-Acetaminophen] Itching  . Hydrocodone-Acetaminophen Itching  . Lortab [Hydrocodone-Acetaminophen] Itching  . Percocet [Oxycodone-Acetaminophen] Itching  . Tramadol Nausea Only    Immunization History  Administered Date(s) Administered  . Fluad  Quad(high Dose 65+) 04/01/2019  . Influenza Split 04/19/2013, 03/18/2014  . Influenza, High Dose Seasonal PF 04/25/2018  . Influenza,inj,Quad PF,6+ Mos 05/04/2015, 02/27/2017  . PFIZER SARS-COV-2 Vaccination 03/06/2020, 03/25/2020  . Pneumococcal Polysaccharide-23 04/19/2013  . Zoster 05/30/2013    Family History  Problem Relation Age of Onset  . Hypertension Mother   . Heart disease Mother        Mitral valve replacement.   . Diabetes Mother   . Cancer Father   . Diabetes Brother   . Colon cancer Neg Hx   . Colon polyps Neg Hx   . Esophageal cancer Neg Hx   . Rectal cancer Neg Hx   . Stomach cancer Neg Hx      Current Outpatient Medications:  .  albuterol (VENTOLIN HFA) 108 (90 Base) MCG/ACT inhaler, INHALE TWO PUFFS EVERY 6 HOURS AS NEEDED FOR WHEEZING OR SHORTNESS OF BREATH, Disp: 8.5 g, Rfl: 1 .  amLODipine (NORVASC) 5 MG tablet, Take 5 mg by mouth daily., Disp: , Rfl:  .  Ascorbic Acid (VITAMIN C) 1000 MG tablet, Take 1,000 mg by mouth daily., Disp: , Rfl:  .  aspirin EC 81 MG tablet, Take 81 mg by mouth at bedtime. , Disp: , Rfl:  .  budesonide-formoterol (SYMBICORT) 160-4.5 MCG/ACT inhaler, Inhale 2 puffs into the lungs 2 (two) times daily., Disp: 1 Inhaler, Rfl: 5 .  Calcium Carbonate-Vit D-Min 1200-1000 MG-UNIT CHEW, Chew 1 tablet by mouth daily. , Disp: , Rfl:  .  diltiazem (CARDIZEM CD) 240 MG 24 hr capsule, Take 240 mg by mouth daily., Disp: , Rfl:  .  estradiol (ESTRACE) 2 MG tablet, Take 2 mg by mouth daily. , Disp: , Rfl:  .   gabapentin (NEURONTIN) 800 MG tablet, Take 1 tablet (800 mg total) by mouth 3 (three) times daily., Disp: 90 tablet, Rfl: 3 .  levalbuterol (XOPENEX HFA) 45 MCG/ACT inhaler, Inhale 1-2 puffs into the lungs every 4 (four) hours as needed. For shortness of breath, Disp: 1 Inhaler, Rfl: 6 .  losartan-hydrochlorothiazide (HYZAAR) 50-12.5 MG tablet, Take 1 tablet by mouth daily., Disp: , Rfl:  .  metFORMIN (GLUCOPHAGE) 500 MG tablet, Take 500 mg by mouth 2 (two) times daily with a meal. , Disp: , Rfl:  .  montelukast (SINGULAIR) 10 MG tablet, Take 10 mg by mouth at bedtime. Pt takes in the morning., Disp: , Rfl:  .  naloxone (NARCAN) nasal spray 4 mg/0.1 mL, Place 1 spray into the nose once as needed (Just in case of Opiod Overdose)., Disp: , Rfl:  .  nortriptyline (PAMELOR) 25 MG capsule, TAKE 1 CAPSULE BY MOUTH AT BEDTIME, Disp: 30 capsule, Rfl: 2 .  omeprazole (PRILOSEC) 40 MG capsule, Take 40 mg by mouth daily., Disp: , Rfl:  .  Oxycodone HCl 10 MG TABS, Take 1 tablet (10 mg total) by mouth 4 (four) times daily as needed., Disp: 120 tablet, Rfl: 0 .  oxymorphone (OPANA ER) 40 MG 12 hr tablet, Take 1 tablet (40 mg total) by mouth 2 (two) times daily., Disp: 60 tablet, Rfl: 0 .  promethazine (PHENERGAN) 25 MG tablet, Take 25 mg by mouth as needed. , Disp: , Rfl:  .  simvastatin (ZOCOR) 10 MG tablet, Take 10 mg by mouth daily., Disp: , Rfl:  .  topiramate (TOPAMAX) 25 MG tablet, Take 1 tablet (25 mg total) by mouth 2 (two) times daily., Disp: 60 tablet, Rfl: 2 .  valACYclovir (VALTREX) 500 MG tablet, Take 500 mg by  mouth daily. , Disp: , Rfl:  No current facility-administered medications for this visit.  Facility-Administered Medications Ordered in Other Visits:  .  levalbuterol (XOPENEX) nebulizer solution 0.63 mg, 0.63 mg, Nebulization, Once, Parrett, Tammy S, NP      Objective:   Vitals:   05/18/20 1337  BP: 132/82  Pulse: 94  Temp: 98.3 F (36.8 C)  TempSrc: Oral  SpO2: 99%  Weight:  165 lb 8 oz (75.1 kg)  Height: 5\' 5"  (1.651 m)    Estimated body mass index is 27.54 kg/m as calculated from the following:   Height as of this encounter: 5\' 5"  (1.651 m).   Weight as of this encounter: 165 lb 8 oz (75.1 kg).  @WEIGHTCHANGE @  Filed Weights   05/18/20 1337  Weight: 165 lb 8 oz (75.1 kg)     Physical Exam General: No distress.  Neuro: Alert and Oriented x 3. GCS 15. Speech normal Psych: Pleasant Resp:  Barrel Chest - no.  Wheeze - no, Crackles - n, No overt respiratory distress CVS: Normal heart sounds. Murmurs - no Ext: Stigmata of Connective Tissue Disease - no HEENT: Normal upper airway. PEERL +. No post nasal drip        Assessment:       ICD-10-CM   1. Mild persistent asthma, uncomplicated  G62.69 POCT EXHALED NITRIC OXIDE  2. Moderate asthma with exacerbation, unspecified whether persistent  J45.901   3. Chronic cough  R05.3   4. Multiple lung nodules  R91.8        Plan:     Patient Instructions     ICD-10-CM   1. Mild persistent asthma, uncomplicated  S85.46 POCT EXHALED NITRIC OXIDE  2. Moderate asthma with exacerbation, unspecified whether persistent  J45.901   3. Chronic cough  R05.3   4. Multiple lung nodules  R91.8    Nitric oxide test normal No wheezing BUt quite symptoamatic c/w asthma flare up ATypical chest pain presnt Previous nodules benign but apppreciate your concern to re-evalaute  Plan Take doxycycline 100mg  po twice daily x 5 days; take after meals and avoid sunlight  Take prednisone 40 mg daily x 2 days, then 20mg  daily x 2 days, then 10mg  daily x 2 days, then 5mg  daily x 2 days and stop  Continue symbicort  Continue singulair  Use albuterol as needed  Get CT chest wo contrast any time  Followup  3-6 months or sooner if needed     SIGNATURE    Dr. Brand Males, M.D., F.C.C.P,  Pulmonary and Critical Care Medicine Staff Physician, [place] Director - Interstitial Lung Disease   Program  Pulmonary [facility] [place], [place], [address]  Pager: [phone], If no answer or between  15:00h - 7:00h: call [phone] Telephone: [phone]  2:07 PM 05/18/2020

## 2020-05-19 DIAGNOSIS — Z1331 Encounter for screening for depression: Secondary | ICD-10-CM | POA: Diagnosis not present

## 2020-05-19 DIAGNOSIS — D239 Other benign neoplasm of skin, unspecified: Secondary | ICD-10-CM | POA: Diagnosis not present

## 2020-05-19 DIAGNOSIS — Z0000 Encounter for general adult medical examination without abnormal findings: Secondary | ICD-10-CM | POA: Diagnosis not present

## 2020-05-19 DIAGNOSIS — E1143 Type 2 diabetes mellitus with diabetic autonomic (poly)neuropathy: Secondary | ICD-10-CM | POA: Diagnosis not present

## 2020-05-19 DIAGNOSIS — E782 Mixed hyperlipidemia: Secondary | ICD-10-CM | POA: Diagnosis not present

## 2020-05-19 DIAGNOSIS — I10 Essential (primary) hypertension: Secondary | ICD-10-CM | POA: Diagnosis not present

## 2020-05-24 ENCOUNTER — Encounter (HOSPITAL_BASED_OUTPATIENT_CLINIC_OR_DEPARTMENT_OTHER): Admission: RE | Payer: Self-pay | Source: Home / Self Care

## 2020-05-24 ENCOUNTER — Ambulatory Visit (HOSPITAL_BASED_OUTPATIENT_CLINIC_OR_DEPARTMENT_OTHER): Admission: RE | Admit: 2020-05-24 | Payer: Medicare Other | Source: Home / Self Care | Admitting: Orthopedic Surgery

## 2020-05-24 SURGERY — DISTAL INTERPHALANGEAL JOINT FUSION
Anesthesia: Monitor Anesthesia Care | Laterality: Right

## 2020-05-31 ENCOUNTER — Other Ambulatory Visit: Payer: Self-pay

## 2020-05-31 ENCOUNTER — Encounter: Payer: Medicare Other | Attending: Physical Medicine & Rehabilitation | Admitting: Registered Nurse

## 2020-05-31 ENCOUNTER — Encounter: Payer: Self-pay | Admitting: Registered Nurse

## 2020-05-31 VITALS — BP 117/74 | HR 92 | Temp 98.1°F | Ht 65.0 in | Wt 167.6 lb

## 2020-05-31 DIAGNOSIS — M542 Cervicalgia: Secondary | ICD-10-CM | POA: Diagnosis not present

## 2020-05-31 DIAGNOSIS — G894 Chronic pain syndrome: Secondary | ICD-10-CM | POA: Diagnosis not present

## 2020-05-31 DIAGNOSIS — G8929 Other chronic pain: Secondary | ICD-10-CM | POA: Diagnosis not present

## 2020-05-31 DIAGNOSIS — Z5181 Encounter for therapeutic drug level monitoring: Secondary | ICD-10-CM | POA: Insufficient documentation

## 2020-05-31 DIAGNOSIS — G039 Meningitis, unspecified: Secondary | ICD-10-CM | POA: Insufficient documentation

## 2020-05-31 DIAGNOSIS — M546 Pain in thoracic spine: Secondary | ICD-10-CM | POA: Diagnosis not present

## 2020-05-31 DIAGNOSIS — Z79891 Long term (current) use of opiate analgesic: Secondary | ICD-10-CM | POA: Diagnosis not present

## 2020-05-31 DIAGNOSIS — M961 Postlaminectomy syndrome, not elsewhere classified: Secondary | ICD-10-CM | POA: Diagnosis not present

## 2020-05-31 DIAGNOSIS — M5416 Radiculopathy, lumbar region: Secondary | ICD-10-CM | POA: Diagnosis not present

## 2020-05-31 DIAGNOSIS — M5412 Radiculopathy, cervical region: Secondary | ICD-10-CM | POA: Insufficient documentation

## 2020-05-31 DIAGNOSIS — R519 Headache, unspecified: Secondary | ICD-10-CM | POA: Insufficient documentation

## 2020-05-31 MED ORDER — OXYCODONE HCL 10 MG PO TABS: 10.0000 mg | ORAL_TABLET | Freq: Four times a day (QID) | ORAL | 0 refills | Status: DC | PRN

## 2020-05-31 MED ORDER — OXYMORPHONE HCL ER 40 MG PO TB12: 40.0000 mg | ORAL_TABLET | Freq: Two times a day (BID) | ORAL | 0 refills | Status: DC

## 2020-05-31 MED ORDER — TOPIRAMATE 25 MG PO TABS: 25.0000 mg | ORAL_TABLET | Freq: Two times a day (BID) | ORAL | 2 refills | Status: DC

## 2020-05-31 NOTE — Progress Notes (Signed)
Subjective:    Patient ID: [name] [name], female    DOB: [birth year], 69 y.o.   MRN: [id number]  HPI: [name] [name] is a 69 y.o. female who returns for follow up appointment for chronic pain and medication refill. She states her pain is located in her neck radiating into her bilatral shoulders occasionally, mid- lower back pain and right hip pain. She rates her pain 7. Her current exercise regime is walking and performing stretching exercises.  [name] [name] Morphine equivalent is 300.00 MME.  Last UDS was Performed on 04/30/2020, it was consistent.    Pain Inventory Average Pain 7 Pain Right Now 7 My pain is constant, sharp, burning, stabbing, tingling and aching  In the last 24 hours, has pain interfered with the following? General activity 7 Relation with others 7 Enjoyment of life 7 What TIME of day is your pain at its worst? morning , daytime, evening and night Sleep (in general) Fair  Pain is worse with: walking, bending, sitting, inactivity, standing and some activites Pain improves with: rest, medication and heat & exercise Relief from Meds:   Family History  Problem Relation Age of Onset  . Hypertension Mother   . Heart disease Mother        Mitral valve replacement.   . Diabetes Mother   . Cancer Father   . Diabetes Brother   . Colon cancer Neg Hx   . Colon polyps Neg Hx   . Esophageal cancer Neg Hx   . Rectal cancer Neg Hx   . Stomach cancer Neg Hx    Social History   Socioeconomic History  . Marital status: Divorced    Spouse name: Not on file  . Number of children: Not on file  . Years of education: Not on file  . Highest education level: Not on file  Occupational History  . Occupation: disable    Employer: DISABLED  Tobacco Use  . Smoking status: Never Smoker  . Smokeless tobacco: Never Used  Vaping Use  . Vaping Use: Never used  Substance and Sexual Activity  . Alcohol use: No  . Drug use: No  . Sexual activity: Not on file    Comment: second  hand smoke  Other Topics Concern  . Not on file  Social History Narrative   Has one child lives at home with her.    Social Determinants of Health   Financial Resource Strain: Not on file  Food Insecurity: Not on file  Transportation Needs: Not on file  Physical Activity: Not on file  Stress: Not on file  Social Connections: Not on file   Past Surgical History:  Procedure Laterality Date  . ABDOMINAL HYSTERECTOMY  1987   W/ BSO  . ANTERIOR CERVICAL DECOMP/DISCECTOMY FUSION N/A 08/09/2012   Procedure: ANTERIOR CERVICAL DECOMPRESSION/DISCECTOMY FUSION 1 LEVEL;  Surgeon: Floyce Stakes, MD;  [facility]: [facility];  Service: Neurosurgery;  Laterality: N/A;  Cervical four-five  Anterior cervical decompression/diskectomy/fusion  . ANTERIOR FUSION CERVICAL SPINE  2007   C5 -6  . APPENDECTOMY    . APPLICATION OF INTRAOPERATIVE CT SCAN N/A 05/17/2017   Procedure: APPLICATION OF INTRAOPERATIVE CT SCAN;  Surgeon: Erline Levine, MD;  Location: [facility];  Service: Neurosurgery;  Laterality: N/A;  . BACK SURGERY     x 5  . BREAST EXCISIONAL BIOPSY Bilateral    No scar seen   . BREAST SURGERY     x 2 biopsies  . CARPAL TUNNEL RELEASE  RIGHT -  2001  & DEC 2011 W/ BACK SURG.  . CERVICAL DISC SURGERY  2005   C5 - 6  . CHOLECYSTECTOMY  1994  . DORSAL COMPARTMENT RELEASE Right 04/07/2013   Procedure: RIGHT WRIST STS RELEASE;  Surgeon: Schuyler Amor, MD;  Location: [facility];  Service: Orthopedics;  Laterality: Right;  . ENDOVENOUS ABLATION SAPHENOUS VEIN W/ LASER Right 05/29/2019   endovenous laser ablation right greater saphenous vein by Gae Gallop MD   . [place] CSF LEAK  [place] 2011  X2   POST [place]  . FINGER ARTHRODESIS Right 04/07/2013   Procedure: RIGHT INDEX AND RIGHT LONG DISTAL INTERPHALANGEAL JOINT FUSIONS;  Surgeon: Schuyler Amor, MD;  Location: [facility];  Service: Orthopedics;  Laterality: Right;  . FINGER  ARTHROPLASTY Right 04/07/2013   Procedure: RIGHT THUMB [place] ARTHROPLASTY;  Surgeon: Schuyler Amor, MD;  Location: [facility];  Service: Orthopedics;  Laterality: Right;  . GANGLION CYST EXCISION Right 04/07/2013   Procedure: RIGHT WRIST MASS EXCISION;  Surgeon: Schuyler Amor, MD;  Location: [facility];  Service: Orthopedics;  Laterality: Right;  . KNEE ARTHROSCOPY  LEFT X3 (LAST ONE 2005)  . KNEE ARTHROSCOPY  05/17/2011   Procedure: ARTHROSCOPY KNEE;  Surgeon: Bradley Ferris III;  Location: [facility];  Service: Orthopedics;  Laterality: Right;  WITH MEDIAL MENISECTOMY AND removal of suprapatella fat lump  . LEFT WRIST TENOSYNECTOMY W/ LEFT THUMB JOINT REPAIR  02-24-10  . LUMBAR FUSION  11-30-10   L4 - 5  . LUMBAR FUSION  2000   L2 - 4  . LUMBAR LAMINECTOMY  DEC 2011   L4 - 5  . POSTERIOR CERVICAL FUSION/FORAMINOTOMY N/A 05/17/2017   Procedure: Cervical five  to Thorastic one  Posterior cervical fusion with lateral mass screws/revision of prior instrumentation;  Surgeon: Erline Levine, MD;  Location: [facility];  Service: Neurosurgery;  Laterality: N/A;  C5 to T1 Posterior cervical fusion with lateral mass screws/revision of prior instrumentation  . TENDON REPAIR  JAN 2010   LEFT INDEX AND LONG FINGERS  . THUMB SURGERY   04/07/2019   RIGHT THUMB  . TUBAL LIGATION     Past Surgical History:  Procedure Laterality Date  . ABDOMINAL HYSTERECTOMY  1987   W/ BSO  . ANTERIOR CERVICAL DECOMP/DISCECTOMY FUSION N/A 08/09/2012   Procedure: ANTERIOR CERVICAL DECOMPRESSION/DISCECTOMY FUSION 1 LEVEL;  Surgeon: Floyce Stakes, MD;  [facility];  Service: Neurosurgery;  Laterality: N/A;  Cervical four-five  Anterior cervical decompression/diskectomy/fusion  . ANTERIOR FUSION CERVICAL SPINE  2007   C5 -6  . APPENDECTOMY    . APPLICATION OF INTRAOPERATIVE CT SCAN N/A 05/17/2017   Procedure: APPLICATION OF INTRAOPERATIVE CT SCAN;  Surgeon:  Erline Levine, MD;  Location: [facility];  Service: Neurosurgery;  Laterality: N/A;  . BACK SURGERY     x 5  . BREAST EXCISIONAL BIOPSY Bilateral    No scar seen   . BREAST SURGERY     x 2 biopsies  . CARPAL TUNNEL RELEASE  RIGHT - 2001  & DEC 2011 W/ BACK SURG.  . CERVICAL DISC SURGERY  2005   C5 - 6  . CHOLECYSTECTOMY  1994  . DORSAL COMPARTMENT RELEASE Right 04/07/2013   Procedure: RIGHT WRIST STS RELEASE;  Surgeon: Schuyler Amor, MD;  Location: [facility];  Service: Orthopedics;  Laterality: Right;  . ENDOVENOUS ABLATION SAPHENOUS VEIN W/ LASER Right 05/29/2019  endovenous laser ablation right greater saphenous vein by Gae Gallop MD   . EXPLORATION OF INCISION FOR CSF LEAK  [place] 2011  X2   POST [place]  . FINGER ARTHRODESIS Right 04/07/2013   Procedure: RIGHT INDEX AND RIGHT LONG DISTAL INTERPHALANGEAL JOINT FUSIONS;  Surgeon: Schuyler Amor, MD;  Location: [facility];  Service: Orthopedics;  Laterality: Right;  . FINGER ARTHROPLASTY Right 04/07/2013   Procedure: RIGHT THUMB [place] ARTHROPLASTY;  Surgeon: Schuyler Amor, MD;  Location: [facility];  Service: Orthopedics;  Laterality: Right;  . GANGLION CYST EXCISION Right 04/07/2013   Procedure: RIGHT WRIST MASS EXCISION;  Surgeon: Schuyler Amor, MD;  Location: [facility];  Service: Orthopedics;  Laterality: Right;  . KNEE ARTHROSCOPY  LEFT X3 (LAST ONE 2005)  . KNEE ARTHROSCOPY  05/17/2011   Procedure: ARTHROSCOPY KNEE;  Surgeon: Bradley Ferris III;  Location: [facility];  Service: Orthopedics;  Laterality: Right;  WITH MEDIAL MENISECTOMY AND removal of suprapatella fat lump  . LEFT WRIST TENOSYNECTOMY W/ LEFT THUMB JOINT REPAIR  02-24-10  . LUMBAR FUSION  11-30-10   L4 - 5  . LUMBAR FUSION  2000   L2 - 4  . LUMBAR LAMINECTOMY  DEC 2011   L4 - 5  . POSTERIOR CERVICAL FUSION/FORAMINOTOMY N/A 05/17/2017   Procedure: Cervical five  to  Thorastic one  Posterior cervical fusion with lateral mass screws/revision of prior instrumentation;  Surgeon: Erline Levine, MD;  Location: [facility];  Service: Neurosurgery;  Laterality: N/A;  C5 to T1 Posterior cervical fusion with lateral mass screws/revision of prior instrumentation  . TENDON REPAIR  JAN 2010   LEFT INDEX AND LONG FINGERS  . THUMB SURGERY   04/07/2019   RIGHT THUMB  . TUBAL LIGATION     Past Medical History:  Diagnosis Date  . Acute sinusitis, unspecified   . Allergy   . Anemia    "quite a few times"  . Anxiety state, unspecified   . Arachnoiditis BILATERAL LEGS   DUE TO MULTIPLE BACK SURG.'S  . Arthritis   . Asthma    last flare up was 03/2017 lasted over a month  . Blood transfusion   . Cancer ([facility])    skin cancers (in scalp)  . Cardiomyopathy HX --06/2010   EF was 25% during acute illness (PHELONEPHRITIS) Repeat echo 12-06-10 60% showed normal EF.   Marland Kitchen Chronic back pain greater than 3 months duration    S/P BACK SURG'S  . CSF leak   . Diabetes mellitus without complication ([facility])    dx 2013 type 2  . Dyslipidemia   . Dysphagia    some post op cerv fusion 2/14  . Dysrhythmia   . Essential hypertension, benign   . Family history of adverse reaction to anesthesia    mother gets n/v  . GERD (gastroesophageal reflux disease) AND HIATIAL HERNIA   CONTROLLED W/ NEXIUM  . Headache(784.0)   . History of chronic bronchitis   . Hx of bladder infections   . Hyperlipidemia    TAKE CHLOSTEROL MEDICATION,04/23/19  . Hypertension   . Neuromuscular disorder ([facility])    numbness and tingling  . Osteoporosis   . Other malaise and fatigue   . PONV (postoperative nausea and vomiting)   . Shortness of breath   . Spinal headache   . Spinal stenosis, cervical region   . Varicosities    venous  . Weakness of both legs DUE TO ARACHNOIDITIS  OCCASIONAL USES CANE   There were no vitals taken for this visit.  Opioid Risk Score:   Fall Risk Score:  `1  Depression  screen PHQ 2/9  Depression screen [facility] 2/9 03/03/2020 01/02/2020 10/13/2019 06/25/2019 10/30/2018 08/14/2018 03/12/2018  Decreased Interest 0 0 0 0 0 0 0  Down, Depressed, Hopeless 0 0 0 0 0 - 0  PHQ - 2 Score 0 0 0 0 0 0 0  Altered sleeping - - - - - - -  Tired, decreased energy - - - - - - -  Change in appetite - - - - - - -  Feeling bad or failure about yourself  - - - - - - -  Trouble concentrating - - - - - - -  Moving slowly or fidgety/restless - - - - - - -  Suicidal thoughts - - - - - - -  PHQ-9 Score - - - - - - -  Some recent data might be hidden   Review of Systems  Musculoskeletal: Positive for back pain, gait problem and neck pain.  Neurological: Positive for headaches.  All other systems reviewed and are negative.      Objective:   Physical Exam Vitals and nursing note reviewed.  Constitutional:      Appearance: Normal appearance.  Neck:     Comments: Cervical Paraspinal Tenderness: C-5-C-6 Cardiovascular:     Rate and Rhythm: Normal rate and regular rhythm.     Pulses: Normal pulses.     Heart sounds: Normal heart sounds.  Pulmonary:     Effort: Pulmonary effort is normal.     Breath sounds: Normal breath sounds.  Musculoskeletal:     Cervical back: Normal range of motion and neck supple.     Comments: Normal Muscle Bulk and Muscle Testing Reveals:  Upper Extremities: Full ROM and Muscle Strength 5/5 Bilateral AC Joint Tenderness  Thoracic Paraspinal Tenderness: T-7-T-9 Lumbar Paraspinal Tenderness: L-3-L-5 Right Greater Trochanter Tenderness Lower Extremities: Full ROM and Muscle Strength 5/5 Arises from Table with ease Narrow Based  Gait   Skin:    General: Skin is warm and dry.  Neurological:     Mental Status: She is alert and oriented to person, place, and time.  Psychiatric:        Mood and Affect: Mood normal.        Behavior: Behavior normal.           Assessment & Plan:  1. On 05/17/2017 :C5C6C7 T1 Posterior Cervical Fusion with lateral  mass fixation with AIRO Imaging, revision of prior instrumentation. By Dr. Vertell Limber.  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 Continuecurrent medication regimen withGabapentin800 mgBID.Refilled:Oxycodone 10mg  one tablet every 6 hours as needed #120tabletsand Oxymorphone HCL 40 mg every 12 hours #60.05/31/2020 We will continue the opioid monitoring program, this consists of regular clinic visits, examinations, urine drug screen, pill counts as well as use of [place] Controlled Substance Reporting system. 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 Anxiety/depression: PCP Following. Continueto monitor.05/31/2020 4. Muscle Spasms: Continuecurrent medication regime withFlexeril. Continue to Monitor.05/31/2020 5. Cervicalgia/ Cervical Radiculitis: Dr Vertell Limber Following. ,Continuecurrent medication regime withGabapentin:  S/P C5C6C7 T1 Posterior Cervical Fusion with lateral mass fixation with AIRO Imaging, revision of prior instrumentation by Dr. Vertell Limber on 05/17/2017.05/31/2020 7. Bilateral Thoracic Back Pain:Continue HEP as Tolerated and Continue current medication  regimen. Continue to Monitor.05/31/2020 8. Left lower extremity DVT/ Phlebitis: S/P endovenous laser ablation of left great saphenous vein on 05/09/2018 by Dr. Scot Dock: Vascular Following.05/31/2020. 9. Insomnia:ContinuePamelor. Continue to Monitor.05/31/2020. 10. Weight Loss: Ms. Lagrand states PCP Following. Continue to monitor.   F/U in 1 month

## 2020-06-02 ENCOUNTER — Ambulatory Visit
Admission: RE | Admit: 2020-06-02 | Discharge: 2020-06-02 | Disposition: A | Discharge status: Home / Self Care | Payer: Medicare Other | Source: Ambulatory Visit | Attending: Internal Medicine | Admitting: Internal Medicine

## 2020-06-02 DIAGNOSIS — R059 Cough, unspecified: Secondary | ICD-10-CM | POA: Diagnosis not present

## 2020-06-02 DIAGNOSIS — R911 Solitary pulmonary nodule: Secondary | ICD-10-CM | POA: Diagnosis not present

## 2020-06-02 DIAGNOSIS — R918 Other nonspecific abnormal finding of lung field: Secondary | ICD-10-CM

## 2020-06-07 ENCOUNTER — Other Ambulatory Visit: Payer: Medicare Other

## 2020-06-07 DIAGNOSIS — M19041 Primary osteoarthritis, right hand: Secondary | ICD-10-CM | POA: Diagnosis not present

## 2020-06-07 DIAGNOSIS — G8918 Other acute postprocedural pain: Secondary | ICD-10-CM | POA: Diagnosis not present

## 2020-06-13 NOTE — Progress Notes (Signed)
[name] [name]  -please let patient know  Thanks  MR xxxxxx  IMPRESSION: Stable tiny 1-2 mm bilateral pulmonary nodules, consistent with benign etiology. No active disease.   Electronically Signed   By: Marlaine Hind M.D.   On: 06/03/2020 09:12

## 2020-06-16 ENCOUNTER — Telehealth: Payer: Self-pay | Admitting: Internal Medicine

## 2020-06-16 NOTE — Telephone Encounter (Signed)
lmtcb for pt.    [name] [name] -please let patient know  Thanks  MR xxxxxx  IMPRESSION: Stable tiny 1-2 mm bilateral pulmonary nodules, consistent with benign etiology. No active disease.   Electronically Signed By: Danae Orleans M.D. On: 06/03/2020 09:12

## 2020-06-17 DIAGNOSIS — Z4789 Encounter for other orthopedic aftercare: Secondary | ICD-10-CM | POA: Diagnosis not present

## 2020-06-17 DIAGNOSIS — Z981 Arthrodesis status: Secondary | ICD-10-CM | POA: Diagnosis not present

## 2020-06-21 NOTE — Telephone Encounter (Signed)
Spoke with pt, aware of results/recs.  Nothing further needed.  

## 2020-06-27 ENCOUNTER — Other Ambulatory Visit: Payer: Self-pay | Admitting: Registered Nurse

## 2020-06-27 DIAGNOSIS — M961 Postlaminectomy syndrome, not elsewhere classified: Secondary | ICD-10-CM

## 2020-06-27 DIAGNOSIS — G039 Meningitis, unspecified: Secondary | ICD-10-CM

## 2020-06-28 ENCOUNTER — Encounter: Payer: Medicare Other | Attending: Physical Medicine & Rehabilitation | Admitting: Registered Nurse

## 2020-06-28 ENCOUNTER — Encounter: Payer: Self-pay | Admitting: Registered Nurse

## 2020-06-28 ENCOUNTER — Other Ambulatory Visit: Payer: Self-pay

## 2020-06-28 VITALS — BP 112/79 | HR 101 | Temp 98.2°F | Ht 65.0 in | Wt 164.6 lb

## 2020-06-28 DIAGNOSIS — Z5181 Encounter for therapeutic drug level monitoring: Secondary | ICD-10-CM

## 2020-06-28 DIAGNOSIS — M5416 Radiculopathy, lumbar region: Secondary | ICD-10-CM

## 2020-06-28 DIAGNOSIS — M546 Pain in thoracic spine: Secondary | ICD-10-CM | POA: Diagnosis not present

## 2020-06-28 DIAGNOSIS — Z79891 Long term (current) use of opiate analgesic: Secondary | ICD-10-CM | POA: Diagnosis not present

## 2020-06-28 DIAGNOSIS — M542 Cervicalgia: Secondary | ICD-10-CM

## 2020-06-28 DIAGNOSIS — G8929 Other chronic pain: Secondary | ICD-10-CM | POA: Diagnosis not present

## 2020-06-28 DIAGNOSIS — M5412 Radiculopathy, cervical region: Secondary | ICD-10-CM | POA: Diagnosis not present

## 2020-06-28 DIAGNOSIS — G894 Chronic pain syndrome: Secondary | ICD-10-CM | POA: Diagnosis not present

## 2020-06-28 DIAGNOSIS — G039 Meningitis, unspecified: Secondary | ICD-10-CM | POA: Diagnosis not present

## 2020-06-28 DIAGNOSIS — M961 Postlaminectomy syndrome, not elsewhere classified: Secondary | ICD-10-CM

## 2020-06-28 MED ORDER — OXYCODONE HCL 10 MG PO TABS
10.0000 mg | ORAL_TABLET | Freq: Four times a day (QID) | ORAL | 0 refills | Status: DC | PRN
Start: 2020-06-28 — End: 2020-07-28

## 2020-06-28 MED ORDER — OXYMORPHONE HCL ER 40 MG PO TB12: 40.0000 mg | ORAL_TABLET | Freq: Two times a day (BID) | ORAL | 0 refills | Status: DC

## 2020-06-28 NOTE — Progress Notes (Signed)
Subjective:    Patient ID: [id number] ARNT, female    DOB: Sep 06, 1950, 70 y.o.   MRN: [id number]  HPI: [name] [name] is a 70 y.o. female who returns for follow up appointment for chronic pain and medication refill. She states her pain is located in her neck radiating into her bilateral shoulders, mid- lower back pain radiating into her bilateral lower extremities. Also reports right ring and middle finger post op- pain. She underwent RIGHT INDEX RIGHT LONG PROXIMAL INTERPHALANGEAL JOINT FUSION by Dr Mina Marble. She's wearnin splints on Right Index and Right Middle Finger. She rates her pain 7. Her current exercise regime is walking and performing stretching exercises.  [name] [name] reports she had two family members passed away on Januar 1May 08, 2022, emotional support given.   [name] [name] Morphine equivalent is 300.00 MME.   Last UDS was Performed on 04/30/2020, it was consistent.   Pain Inventory Average Pain 7 Pain Right Now 7 My pain is constant, sharp, burning, stabbing, tingling and aching  In the last 24 hours, has pain interfered with the following? General activity 7 Relation with others 7 Enjoyment of life 7 What TIME of day is your pain at its worst? morning , daytime, evening and night Sleep (in general) Fair  Pain is worse with: walking, bending, sitting, inactivity, standing and some activites Pain improves with: rest, heat/ice, therapy/exercise and medication Relief from Meds: 7  Family History  Problem Relation Age of Onset  . Hypertension Mother   . Heart disease Mother        Mitral valve replacement.   . Diabetes Mother   . Cancer Father   . Diabetes Brother   . Colon cancer Neg Hx   . Colon polyps Neg Hx   . Esophageal cancer Neg Hx   . Rectal cancer Neg Hx   . Stomach cancer Neg Hx    Social History   Socioeconomic History  . Marital status: Divorced    Spouse name: Not on file  . Number of children: Not on file  . Years of education: Not on file  . Highest  education level: Not on file  Occupational History  . Occupation: disable    Employer: DISABLED  Tobacco Use  . Smoking status: Never Smoker  . Smokeless tobacco: Never Used  Vaping Use  . Vaping Use: Never used  Substance and Sexual Activity  . Alcohol use: No  . Drug use: No  . Sexual activity: Not on file    Comment: second hand smoke  Other Topics Concern  . Not on file  Social History Narrative   Has one child lives at home with her.    Social Determinants of Health   Financial Resource Strain: Not on file  Food Insecurity: Not on file  Transportation Needs: Not on file  Physical Activity: Not on file  Stress: Not on file  Social Connections: Not on file   Past Surgical History:  Procedure Laterality Date  . ABDOMINAL HYSTERECTOMY  1987   W/ BSO  . ANTERIOR CERVICAL DECOMP/DISCECTOMY FUSION N/A 08/09/2012   Procedure: ANTERIOR CERVICAL DECOMPRESSION/DISCECTOMY FUSION 1 LEVEL;  Surgeon: Karn Cassis, MD;  Location: [facility];  Service: Neurosurgery;  Laterality: N/A;  Cervical four-five  Anterior cervical decompression/diskectomy/fusion  . ANTERIOR FUSION CERVICAL SPINE  2005/10/24   C5 -6  . APPENDECTOMY    . APPLICATION OF INTRAOPERATIVE CT SCAN N/A 05/17/2017   Procedure: APPLICATION OF INTRAOPERATIVE CT SCAN;  Surgeon: Maeola Harman, MD;  Location: [facility];  Service: Neurosurgery;  Laterality: N/A;  . BACK SURGERY     x 5  . BREAST EXCISIONAL BIOPSY Bilateral    No scar seen   . BREAST SURGERY     x 2 biopsies  . CARPAL TUNNEL RELEASE  RIGHT - 2001  & DEC 2011 W/ BACK SURG.  . CERVICAL DISC SURGERY  2005   C5 - 6  . CHOLECYSTECTOMY  1994  . DORSAL COMPARTMENT RELEASE Right 04/07/2013   Procedure: RIGHT WRIST STS RELEASE;  Surgeon: Schuyler Amor, MD;  Location: [facility];  Service: Orthopedics;  Laterality: Right;  . ENDOVENOUS ABLATION SAPHENOUS VEIN W/ LASER Right 05/29/2019   endovenous laser ablation right greater saphenous vein by  Gae Gallop MD   . [place] CSF LEAK  [place] 2011  X2   POST [place]  . FINGER ARTHRODESIS Right 04/07/2013   Procedure: RIGHT INDEX AND RIGHT LONG DISTAL INTERPHALANGEAL JOINT FUSIONS;  Surgeon: Schuyler Amor, MD;  Location: [facility];  Service: Orthopedics;  Laterality: Right;  . FINGER ARTHROPLASTY Right 04/07/2013   Procedure: RIGHT THUMB [place] ARTHROPLASTY;  Surgeon: Schuyler Amor, MD;  Location: [place] [place];  Service: Orthopedics;  Laterality: Right;  . GANGLION CYST EXCISION Right 04/07/2013   Procedure: RIGHT WRIST MASS EXCISION;  Surgeon: Schuyler Amor, MD;  Location: [facility];  Service: Orthopedics;  Laterality: Right;  . KNEE ARTHROSCOPY  LEFT X3 (LAST ONE 2005)  . KNEE ARTHROSCOPY  05/17/2011   Procedure: ARTHROSCOPY KNEE;  Surgeon: Bradley Ferris III;  Location: [facility];  Service: Orthopedics;  Laterality: Right;  WITH MEDIAL MENISECTOMY AND removal of suprapatella fat lump  . LEFT WRIST TENOSYNECTOMY W/ LEFT THUMB JOINT REPAIR  02-24-10  . LUMBAR FUSION  11-30-10   L4 - 5  . LUMBAR FUSION  2000   L2 - 4  . LUMBAR LAMINECTOMY  DEC 2011   L4 - 5  . POSTERIOR CERVICAL FUSION/FORAMINOTOMY N/A 05/17/2017   Procedure: Cervical five  to Thorastic one  Posterior cervical fusion with lateral mass screws/revision of prior instrumentation;  Surgeon: Erline Levine, MD;  Location: [facility];  Service: Neurosurgery;  Laterality: N/A;  C5 to T1 Posterior cervical fusion with lateral mass screws/revision of prior instrumentation  . TENDON REPAIR  JAN 2010   LEFT INDEX AND LONG FINGERS  . THUMB SURGERY   04/07/2019   RIGHT THUMB  . TUBAL LIGATION     Past Surgical History:  Procedure Laterality Date  . ABDOMINAL HYSTERECTOMY  1987   W/ BSO  . ANTERIOR CERVICAL DECOMP/DISCECTOMY FUSION N/A 08/09/2012   Procedure: ANTERIOR CERVICAL DECOMPRESSION/DISCECTOMY FUSION 1 LEVEL;  Surgeon: Floyce Stakes, MD;  Location[facility];  Service: Neurosurgery;  Laterality: N/A;  Cervical four-five  Anterior cervical decompression/diskectomy/fusion  . ANTERIOR FUSION CERVICAL SPINE  2007   C5 -6  . APPENDECTOMY    . APPLICATION OF INTRAOPERATIVE CT SCAN N/A 05/17/2017   Procedure: APPLICATION OF INTRAOPERATIVE CT SCAN;  Surgeon: Erline Levine, MD;  Location: [facility];  Service: Neurosurgery;  Laterality: N/A;  . BACK SURGERY     x 5  . BREAST EXCISIONAL BIOPSY Bilateral    No scar seen   . BREAST SURGERY     x 2 biopsies  . CARPAL TUNNEL RELEASE  RIGHT - 2001  & DEC 2011 W/ BACK SURG.  [place] SURGERY  2005  C5 - 6  . CHOLECYSTECTOMY  1994  . DORSAL COMPARTMENT RELEASE Right 04/07/2013   Procedure: RIGHT WRIST STS RELEASE;  Surgeon: Schuyler Amor, MD;  Location: [facility];  Service: Orthopedics;  Laterality: Right;  . ENDOVENOUS ABLATION SAPHENOUS VEIN W/ LASER Right 05/29/2019   endovenous laser ablation right greater saphenous vein by Gae Gallop MD   . [place] CSF LEAK  [place] 2011  X2   POST [place]  . FINGER ARTHRODESIS Right 04/07/2013   Procedure: RIGHT INDEX AND RIGHT LONG DISTAL INTERPHALANGEAL JOINT FUSIONS;  Surgeon: Schuyler Amor, MD;  Location: [place];  Service: Orthopedics;  Laterality: Right;  . FINGER ARTHROPLASTY Right 04/07/2013   Procedure: RIGHT THUMB [place] ARTHROPLASTY;  Surgeon: Schuyler Amor, MD;  Location: [facility];  Service: Orthopedics;  Laterality: Right;  . GANGLION CYST EXCISION Right 04/07/2013   Procedure: RIGHT WRIST MASS EXCISION;  Surgeon: Schuyler Amor, MD;  Location: [facility];  Service: Orthopedics;  Laterality: Right;  . KNEE ARTHROSCOPY  LEFT X3 (LAST ONE 2005)  . KNEE ARTHROSCOPY  05/17/2011   Procedure: ARTHROSCOPY KNEE;  Surgeon: Bradley Ferris III;  Location: [facility];  Service: Orthopedics;  Laterality: Right;   WITH MEDIAL MENISECTOMY AND removal of suprapatella fat lump  . LEFT WRIST TENOSYNECTOMY W/ LEFT THUMB JOINT REPAIR  02-24-10  . LUMBAR FUSION  11-30-10   L4 - 5  . LUMBAR FUSION  2000   L2 - 4  . LUMBAR LAMINECTOMY  DEC 2011   L4 - 5  . POSTERIOR CERVICAL FUSION/FORAMINOTOMY N/A 05/17/2017   Procedure: Cervical five  to Thorastic one  Posterior cervical fusion with lateral mass screws/revision of prior instrumentation;  Surgeon: Erline Levine, MD;  Location: [facility];  Service: Neurosurgery;  Laterality: N/A;  C5 to T1 Posterior cervical fusion with lateral mass screws/revision of prior instrumentation  . TENDON REPAIR  JAN 2010   LEFT INDEX AND LONG FINGERS  . THUMB SURGERY   04/07/2019   RIGHT THUMB  . TUBAL LIGATION     Past Medical History:  Diagnosis Date  . Acute sinusitis, unspecified   . Allergy   . Anemia    "quite a few times"  . Anxiety state, unspecified   . Arachnoiditis BILATERAL LEGS   DUE TO MULTIPLE BACK SURG.'S  . Arthritis   . Asthma    last flare up was 03/2017 lasted over a month  . Blood transfusion   . Cancer ([place])    skin cancers (in scalp)  . Cardiomyopathy HX --06/2010   EF was 25% during acute illness (PHELONEPHRITIS) Repeat echo 12-06-10 60% showed normal EF.   Marland Kitchen Chronic back pain greater than 3 months duration    S/P BACK SURG'S  . CSF leak   . Diabetes mellitus without complication ([place])    dx 2013 type 2  . Dyslipidemia   . Dysphagia    some post op cerv fusion 2/14  . Dysrhythmia   . Essential hypertension, benign   . Family history of adverse reaction to anesthesia    mother gets n/v  . GERD (gastroesophageal reflux disease) AND HIATIAL HERNIA   CONTROLLED W/ NEXIUM  . Headache(784.0)   . History of chronic bronchitis   . Hx of bladder infections   . Hyperlipidemia    TAKE CHLOSTEROL MEDICATION,04/23/19  . Hypertension   . Neuromuscular disorder ([facility])    numbness and tingling  .  Osteoporosis   . Other malaise and fatigue   . PONV  (postoperative nausea and vomiting)   . Shortness of breath   . Spinal headache   . Spinal stenosis, cervical region   . Varicosities    venous  . Weakness of both legs DUE TO ARACHNOIDITIS   OCCASIONAL USES CANE   BP 112/79   Pulse (!) 101   Temp 98.2 F (36.8 C)   Ht 5\' 5"  (1.651 m)   Wt 164 lb 9.6 oz (74.7 kg)   SpO2 97%   BMI 27.39 kg/m   Opioid Risk Score:   Fall Risk Score:  `1  Depression screen PHQ 2/9  Depression screen [facility] 2/9 05/31/2020 03/03/2020 01/02/2020 10/13/2019 06/25/2019 10/30/2018 08/14/2018  Decreased Interest 0 0 0 0 0 0 0  Down, Depressed, Hopeless 0 0 0 0 0 0 -  PHQ - 2 Score 0 0 0 0 0 0 0  Altered sleeping - - - - - - -  Tired, decreased energy - - - - - - -  Change in appetite - - - - - - -  Feeling bad or failure about yourself  - - - - - - -  Trouble concentrating - - - - - - -  Moving slowly or fidgety/restless - - - - - - -  Suicidal thoughts - - - - - - -  PHQ-9 Score - - - - - - -  Some recent data might be hidden   Review of Systems  Musculoskeletal: Positive for back pain, gait problem and neck pain.  Neurological: Positive for headaches.  All other systems reviewed and are negative.      Objective:   Physical Exam Vitals and nursing note reviewed.  Constitutional:      Appearance: Normal appearance.  Cardiovascular:     Rate and Rhythm: Normal rate and regular rhythm.     Pulses: Normal pulses.     Heart sounds: Normal heart sounds.  Pulmonary:     Effort: Pulmonary effort is normal.     Breath sounds: Normal breath sounds.  Musculoskeletal:     Cervical back: Normal range of motion and neck supple.     Comments: Normal Muscle Bulk and Muscle Testing Reveals:  Upper Extremities: Full ROM and Muscle Strength on Right 3/5 and Left: Muscle Strength 5/5 Bilateral AC Joint Tenderness Lumbar Hypersensitivity Lower Extremities: Full ROM and Muscle Strength 5/5 Arises from Table with ease Narrow Based  Gait   Skin:    General:  Skin is warm and dry.  Neurological:     Mental Status: She is alert and oriented to person, place, and time.  Psychiatric:        Mood and Affect: Mood normal.        Behavior: Behavior normal.           Assessment & Plan:  1. On 05/17/2017 :C5C6C7 T1 Posterior Cervical Fusion with lateral mass fixation with AIR[facility] Imaging, revision of prior instrumentation. By Dr. Vertell Limber.  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 Continuecurrent medication regimen withGabapentin800 mgBID.Refilled:Oxycodone 10mg  one tablet every 6 hours as needed #120tabletsand Oxymorphone HCL 40 mg every 12 hours #60.06/28/2020 We will continue the opioid monitoring program, this consists of regular clinic visits, examinations, urine drug screen, pill counts as well as use of [place] Controlled Substance Reporting system. 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 Anxiety/depression: PCP Following. Continueto monitor.06/28/2020 4. Muscle Spasms: Continuecurrent medication regime withFlexeril. Continue to Monitor.06/28/2020 5. Cervicalgia/ Cervical Radiculitis: Dr Vertell Limber Following. ,Continuecurrent medication regime withGabapentin:  S/P C5C6C7 T1 Posterior Cervical Fusion with lateral mass fixation with AIRO Imaging, revision of prior instrumentation by Dr. Vertell Limber on 05/17/2017.06/28/2020 7. Bilateral Thoracic Back Pain:Continue HEP as Tolerated and Continue current medication regimen. Continue to Monitor.06/28/2020 8. Left lower extremity DVT/ Phlebitis: S/P endovenous laser ablation of left great saphenous vein on 05/09/2018 by Dr. Scot Dock: Vascular Following.06/28/2020. 9. Insomnia:ContinuePamelor. Continue to  Monitor.06/28/2020. 10. Weight Loss: Ms. Claeys states PCP Following. Continue to monitor. 06/28/2020.   F/U in 1 month

## 2020-07-20 DIAGNOSIS — Z4789 Encounter for other orthopedic aftercare: Secondary | ICD-10-CM | POA: Diagnosis not present

## 2020-07-20 DIAGNOSIS — Z981 Arthrodesis status: Secondary | ICD-10-CM | POA: Diagnosis not present

## 2020-07-28 ENCOUNTER — Encounter: Payer: Self-pay | Admitting: Registered Nurse

## 2020-07-28 ENCOUNTER — Encounter: Payer: Medicare Other | Attending: Physical Medicine & Rehabilitation | Admitting: Registered Nurse

## 2020-07-28 ENCOUNTER — Other Ambulatory Visit: Payer: Self-pay

## 2020-07-28 VITALS — BP 133/83 | HR 95 | Temp 98.2°F | Ht 65.0 in | Wt 169.2 lb

## 2020-07-28 DIAGNOSIS — G894 Chronic pain syndrome: Secondary | ICD-10-CM | POA: Diagnosis not present

## 2020-07-28 DIAGNOSIS — M25562 Pain in left knee: Secondary | ICD-10-CM

## 2020-07-28 DIAGNOSIS — Z79891 Long term (current) use of opiate analgesic: Secondary | ICD-10-CM | POA: Diagnosis not present

## 2020-07-28 DIAGNOSIS — M5412 Radiculopathy, cervical region: Secondary | ICD-10-CM | POA: Diagnosis not present

## 2020-07-28 DIAGNOSIS — M546 Pain in thoracic spine: Secondary | ICD-10-CM | POA: Diagnosis not present

## 2020-07-28 DIAGNOSIS — M25561 Pain in right knee: Secondary | ICD-10-CM | POA: Diagnosis not present

## 2020-07-28 DIAGNOSIS — M961 Postlaminectomy syndrome, not elsewhere classified: Secondary | ICD-10-CM | POA: Diagnosis not present

## 2020-07-28 DIAGNOSIS — G039 Meningitis, unspecified: Secondary | ICD-10-CM | POA: Diagnosis not present

## 2020-07-28 DIAGNOSIS — Z5181 Encounter for therapeutic drug level monitoring: Secondary | ICD-10-CM | POA: Diagnosis not present

## 2020-07-28 DIAGNOSIS — M542 Cervicalgia: Secondary | ICD-10-CM

## 2020-07-28 DIAGNOSIS — G8929 Other chronic pain: Secondary | ICD-10-CM | POA: Diagnosis not present

## 2020-07-28 DIAGNOSIS — M5416 Radiculopathy, lumbar region: Secondary | ICD-10-CM | POA: Diagnosis not present

## 2020-07-28 MED ORDER — OXYMORPHONE HCL ER 40 MG PO TB12: 40.0000 mg | ORAL_TABLET | Freq: Two times a day (BID) | ORAL | 0 refills | Status: DC

## 2020-07-28 MED ORDER — OXYCODONE HCL 10 MG PO TABS: 10.0000 mg | ORAL_TABLET | Freq: Four times a day (QID) | ORAL | 0 refills | Status: DC | PRN

## 2020-07-28 NOTE — Progress Notes (Signed)
Subjective:    Patient ID: [name] [name], female    DOB: [birth year], 70 y.o.   MRN: [id number]  HPI: [name] [name] is a 70 y.o. female who returns for follow up appointment for chronic pain and medication refill. She states her  pain is located in her neck radiating into her bilateral shoulders, mid- lower back pain and bilateral knee pain L>R. She also reports  generalized joint pain. She rates her pain 8. Her current exercise regime is walking and performing stretching exercises.  [name] [name] reports on Monday 07/26/2020, she was painting a door in her home, step down and lost her balanced, fell forward against her chest drawer and landed on her knees. She was able to pick herself up, she didn't seek medical attention. Also reports she has an Ortho appointment on 07/30/2020.   [name] [name] Morphine equivalent is 300.00 MME.    Last UDS was Performed on 04/30/2020, it was consistent.    Pain Inventory Average Pain 7 Pain Right Now 8 My pain is constant, sharp, burning, stabbing, tingling and aching  In the last 24 hours, has pain interfered with the following? General activity 7 Relation with others 7 Enjoyment of life 7 What TIME of day is your pain at its worst? morning , daytime, evening and night Sleep (in general) Fair  Pain is worse with: walking, bending, sitting, inactivity, standing and some activites Pain improves with: rest, pacing activities, medication and heat Relief from Meds: 7  Family History  Problem Relation Age of Onset  . Hypertension Mother   . Heart disease Mother        Mitral valve replacement.   . Diabetes Mother   . Cancer Father   . Diabetes Brother   . Colon cancer Neg Hx   . Colon polyps Neg Hx   . Esophageal cancer Neg Hx   . Rectal cancer Neg Hx   . Stomach cancer Neg Hx    Social History   Socioeconomic History  . Marital status: Divorced    Spouse name: Not on file  . Number of children: Not on file  . Years of education: Not on file   . Highest education level: Not on file  Occupational History  . Occupation: disable    Employer: DISABLED  Tobacco Use  . Smoking status: Never Smoker  . Smokeless tobacco: Never Used  Vaping Use  . Vaping Use: Never used  Substance and Sexual Activity  . Alcohol use: No  . Drug use: No  . Sexual activity: Not on file    Comment: second hand smoke  Other Topics Concern  . Not on file  Social History Narrative   Has one child lives at home with her.    Social Determinants of Health   Financial Resource Strain: Not on file  Food Insecurity: Not on file  Transportation Needs: Not on file  Physical Activity: Not on file  Stress: Not on file  Social Connections: Not on file   Past Surgical History:  Procedure Laterality Date  . ABDOMINAL HYSTERECTOMY  1987   W/ BSO  . ANTERIOR CERVICAL DECOMP/DISCECTOMY FUSION N/A 08/09/2012   Procedure: ANTERIOR CERVICAL DECOMPRESSION/DISCECTOMY FUSION 1 LEVEL;  Surgeon: Floyce Stakes, MD;  [facility];  Service: Neurosurgery;  Laterality: N/A;  Cervical four-five  Anterior cervical decompression/diskectomy/fusion  . ANTERIOR FUSION CERVICAL SPINE  2007   C5 -6  . APPENDECTOMY    . APPLICATION OF INTRAOPERATIVE CT SCAN N/A 05/17/2017  Procedure: APPLICATION OF INTRAOPERATIVE CT SCAN;  Surgeon: Erline Levine, MD;  Location: [place];  Service: Neurosurgery;  Laterality: N/A;  . BACK SURGERY     x 5  . BREAST EXCISIONAL BIOPSY Bilateral    No scar seen   . BREAST SURGERY     x 2 biopsies  . CARPAL TUNNEL RELEASE  RIGHT - 2001  & DEC 2011 W/ BACK SURG.  . CERVICAL DISC SURGERY  2005   C5 - 6  . CHOLECYSTECTOMY  1994  . DORSAL COMPARTMENT RELEASE Right 04/07/2013   Procedure: RIGHT WRIST STS RELEASE;  Surgeon: Schuyler Amor, MD;  Location: [place];  Service: Orthopedics;  Laterality: Right;  . ENDOVENOUS ABLATION SAPHENOUS VEIN W/ LASER Right 05/29/2019   endovenous laser ablation right greater saphenous  vein by Gae Gallop MD   . [place] CSF LEAK  [place] 2011  X2   POST [place]  . FINGER ARTHRODESIS Right 04/07/2013   Procedure: RIGHT INDEX AND RIGHT LONG DISTAL INTERPHALANGEAL JOINT FUSIONS;  Surgeon: Schuyler Amor, MD;  Location: [place];  Service: Orthopedics;  Laterality: Right;  . FINGER ARTHROPLASTY Right 04/07/2013   Procedure: RIGHT THUMB [place] ARTHROPLASTY;  Surgeon: Schuyler Amor, MD;  Location: [place];  Service: Orthopedics;  Laterality: Right;  . GANGLION CYST EXCISION Right 04/07/2013   Procedure: RIGHT WRIST MASS EXCISION;  Surgeon: Schuyler Amor, MD;  Location: [place];  Service: Orthopedics;  Laterality: Right;  . KNEE ARTHROSCOPY  LEFT X3 (LAST ONE 2005)  . KNEE ARTHROSCOPY  05/17/2011   Procedure: ARTHROSCOPY KNEE;  Surgeon: Bradley Ferris III;  Location: [place] [facility];  Service: Orthopedics;  Laterality: Right;  WITH MEDIAL MENISECTOMY AND removal of suprapatella fat lump  . LEFT WRIST TENOSYNECTOMY W/ LEFT THUMB JOINT REPAIR  02-24-10  . LUMBAR FUSION  11-30-10   L4 - 5  . LUMBAR FUSION  2000   L2 - 4  . LUMBAR LAMINECTOMY  DEC 2011   L4 - 5  . POSTERIOR CERVICAL FUSION/FORAMINOTOMY N/A 05/17/2017   Procedure: Cervical five  to Thorastic one  Posterior cervical fusion with lateral mass screws/revision of prior instrumentation;  Surgeon: Erline Levine, MD;  Location: [facility];  Service: Neurosurgery;  Laterality: N/A;  C5 to T1 Posterior cervical fusion with lateral mass screws/revision of prior instrumentation  . TENDON REPAIR  JAN 2010   LEFT INDEX AND LONG FINGERS  . THUMB SURGERY   04/07/2019   RIGHT THUMB  . TUBAL LIGATION     Past Surgical History:  Procedure Laterality Date  . ABDOMINAL HYSTERECTOMY  1987   W/ BSO  . ANTERIOR CERVICAL DECOMP/DISCECTOMY FUSION N/A 08/09/2012   Procedure: ANTERIOR CERVICAL DECOMPRESSION/DISCECTOMY FUSION 1 LEVEL;  Surgeon:  Floyce Stakes, MD;  [facility];  Service: Neurosurgery;  Laterality: N/A;  Cervical four-five  Anterior cervical decompression/diskectomy/fusion  . ANTERIOR FUSION CERVICAL SPINE  2007   C5 -6  . APPENDECTOMY    . APPLICATION OF INTRAOPERATIVE CT SCAN N/A 05/17/2017   Procedure: APPLICATION OF INTRAOPERATIVE CT SCAN;  Surgeon: Erline Levine, MD;  Location: [facility];  Service: Neurosurgery;  Laterality: N/A;  . BACK SURGERY     x 5  . BREAST EXCISIONAL BIOPSY Bilateral    No scar seen   . BREAST SURGERY     x 2 biopsies  . CARPAL TUNNEL RELEASE  RIGHT - 2001  & DEC 2011  W/ BACK SURG.  . CERVICAL DISC SURGERY  2005   C5 - 6  . CHOLECYSTECTOMY  1994  . DORSAL COMPARTMENT RELEASE Right 04/07/2013   Procedure: RIGHT WRIST STS RELEASE;  Surgeon: Schuyler Amor, MD;  Location: [facility];  Service: Orthopedics;  Laterality: Right;  . ENDOVENOUS ABLATION SAPHENOUS VEIN W/ LASER Right 05/29/2019   endovenous laser ablation right greater saphenous vein by Gae Gallop MD   . [place] CSF LEAK  [place] 2011  X2   POST [place]  . FINGER ARTHRODESIS Right 04/07/2013   Procedure: RIGHT INDEX AND RIGHT LONG DISTAL INTERPHALANGEAL JOINT FUSIONS;  Surgeon: Schuyler Amor, MD;  Location: [facility];  Service: Orthopedics;  Laterality: Right;  . FINGER ARTHROPLASTY Right 04/07/2013   Procedure: RIGHT THUMB [place] ARTHROPLASTY;  Surgeon: Schuyler Amor, MD;  Location: [facility];  Service: Orthopedics;  Laterality: Right;  . GANGLION CYST EXCISION Right 04/07/2013   Procedure: RIGHT WRIST MASS EXCISION;  Surgeon: Schuyler Amor, MD;  Location: [facility];  Service: Orthopedics;  Laterality: Right;  . KNEE ARTHROSCOPY  LEFT X3 (LAST ONE 2005)  . KNEE ARTHROSCOPY  05/17/2011   Procedure: ARTHROSCOPY KNEE;  Surgeon: Bradley Ferris III;  Location: [facility];  Service: Orthopedics;   Laterality: Right;  WITH MEDIAL MENISECTOMY AND removal of suprapatella fat lump  . LEFT WRIST TENOSYNECTOMY W/ LEFT THUMB JOINT REPAIR  02-24-10  . LUMBAR FUSION  11-30-10   L4 - 5  . LUMBAR FUSION  2000   L2 - 4  . LUMBAR LAMINECTOMY  DEC 2011   L4 - 5  . POSTERIOR CERVICAL FUSION/FORAMINOTOMY N/A 05/17/2017   Procedure: Cervical five  to Thorastic one  Posterior cervical fusion with lateral mass screws/revision of prior instrumentation;  Surgeon: Erline Levine, MD;  Location: [facility];  Service: Neurosurgery;  Laterality: N/A;  C5 to T1 Posterior cervical fusion with lateral mass screws/revision of prior instrumentation  . TENDON REPAIR  JAN 2010   LEFT INDEX AND LONG FINGERS  . THUMB SURGERY   04/07/2019   RIGHT THUMB  . TUBAL LIGATION     Past Medical History:  Diagnosis Date  . Acute sinusitis, unspecified   . Allergy   . Anemia    "quite a few times"  . Anxiety state, unspecified   . Arachnoiditis BILATERAL LEGS   DUE TO MULTIPLE BACK SURG.'S  . Arthritis   . Asthma    last flare up was 03/2017 lasted over a month  . Blood transfusion   . Cancer ([place])    skin cancers (in scalp)  . Cardiomyopathy HX --06/2010   EF was 25% during acute illness (PHELONEPHRITIS) Repeat echo 12-06-10 60% showed normal EF.   Marland Kitchen Chronic back pain greater than 3 months duration    S/P BACK SURG'S  . CSF leak   . Diabetes mellitus without complication ([place])    dx 2013 type 2  . Dyslipidemia   . Dysphagia    some post op cerv fusion 2/14  . Dysrhythmia   . Essential hypertension, benign   . Family history of adverse reaction to anesthesia    mother gets n/v  . GERD (gastroesophageal reflux disease) AND HIATIAL HERNIA   CONTROLLED W/ NEXIUM  . Headache(784.0)   . History of chronic bronchitis   . Hx of bladder infections   . Hyperlipidemia    TAKE CHLOSTEROL MEDICATION,04/23/19  . Hypertension   .  Neuromuscular disorder (HCC)    numbness and tingling  . Osteoporosis   . Other malaise  and fatigue   . PONV (postoperative nausea and vomiting)   . Shortness of breath   . Spinal headache   . Spinal stenosis, cervical region   . Varicosities    venous  . Weakness of both legs DUE TO ARACHNOIDITIS   OCCASIONAL USES CANE   There were no vitals taken for this visit.  Opioid Risk Score:   Fall Risk Score:  `1  Depression screen PHQ 2/9  Depression screen [facility] 2/9 05/31/2020 03/03/2020 01/02/2020 10/13/2019 06/25/2019 10/30/2018 08/14/2018  Decreased Interest 0 0 0 0 0 0 0  Down, Depressed, Hopeless 0 0 0 0 0 0 -  PHQ - 2 Score 0 0 0 0 0 0 0  Altered sleeping - - - - - - -  Tired, decreased energy - - - - - - -  Change in appetite - - - - - - -  Feeling bad or failure about yourself  - - - - - - -  Trouble concentrating - - - - - - -  Moving slowly or fidgety/restless - - - - - - -  Suicidal thoughts - - - - - - -  PHQ-9 Score - - - - - - -  Some recent data might be hidden   Review of Systems  Musculoskeletal: Positive for neck pain.       Both knee, both legs are in pain       Objective:   Physical Exam Vitals and nursing note reviewed.  Constitutional:      Appearance: Normal appearance.  Neck:     Comments: Cervical Paraspinal Tenderness: C-5-C-6 Cardiovascular:     Rate and Rhythm: Normal rate and regular rhythm.     Pulses: Normal pulses.     Heart sounds: Normal heart sounds.  Pulmonary:     Effort: Pulmonary effort is normal.     Breath sounds: Normal breath sounds.  Musculoskeletal:     Cervical back: Normal range of motion and neck supple.     Comments: Normal Muscle Bulk and Muscle Testing Reveals:  Upper Extremities: Full ROM and Muscle Strength on Right 3/5 and Left 4/5  Bilateral AC Joint Tenderness Thoracic Paraspinal Tenderness: T-7-T-9 Lumbar Hypersensitivity Lower Extremities: Right: Decreased ROM and Muscle Strength 5/5 Right Lower Extremity Flexion Produces Pain into her Right Patella Left Lower Extremity Decreased ROM and Muscle  Strength 5/5. Left Lower Extremity Flexion Produces Pain into her Left Patella Arises from Table S;lowly using cane for support Antalgic  Gait   Skin:    General: Skin is warm and dry.  Neurological:     Mental Status: She is alert and oriented to person, place, and time.  Psychiatric:        Mood and Affect: Mood normal.        Behavior: Behavior normal.           Assessment & Plan:  1. On 05/17/2017 :C5C6C7 T1 Posterior Cervical Fusion with lateral mass fixation with AIRO Imaging, revision of prior instrumentation. By Dr. Vertell Limber.  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 Continuecurrent medication regimen withGabapentin800 mgBID.Refilled:Oxycodone 10mg  one tablet every 6 hours as needed #120tabletsand Oxymorphone HCL 40 mg every 12 hours #60.07/28/2020 We will continue the opioid monitoring program, this consists of regular clinic visits, examinations, urine drug screen, pill counts as well as use of [place] Controlled Substance Reporting system. 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 Anxiety/depression: PCP Following. Continueto monitor.07/28/2020 4. Muscle Spasms: Continuecurrent medication regime withFlexeril. Continue to Monitor.07/28/2020 5. Cervicalgia/ Cervical Radiculitis: Dr SternFollowing.,Continuecurrent medication regime withGabapentin: S/P C5C6C7 T1 Posterior Cervical Fusion with lateral mass fixation with AIRO Imaging, revision of prior instrumentation by Dr. Vertell Limber on 05/17/2017.07/28/2020 7. Bilateral Thoracic Back Pain:Continue HEP as Tolerated and Continue current medication regimen. Continue to Monitor.07/28/2020 8. Left lower extremity DVT/ Phlebitis: S/P endovenous laser  ablation of left great saphenous vein on 05/09/2018 by Dr. Scot Dock: Vascular Following.07/28/2020. 9. Insomnia:ContinuePamelor. Continue to Monitor.07/28/2020. 10. Acute Bilateral Knee Pain: She has an appointment with orthopedics on 07/30/2020  F/U in 1 month

## 2020-07-30 ENCOUNTER — Ambulatory Visit (INDEPENDENT_AMBULATORY_CARE_PROVIDER_SITE_OTHER): Payer: Medicare Other | Admitting: Physician Assistant

## 2020-07-30 ENCOUNTER — Ambulatory Visit (INDEPENDENT_AMBULATORY_CARE_PROVIDER_SITE_OTHER): Payer: Medicare Other

## 2020-07-30 ENCOUNTER — Encounter: Payer: Self-pay | Admitting: Physician Assistant

## 2020-07-30 ENCOUNTER — Ambulatory Visit: Payer: Self-pay

## 2020-07-30 DIAGNOSIS — M25562 Pain in left knee: Secondary | ICD-10-CM

## 2020-07-30 DIAGNOSIS — M25561 Pain in right knee: Secondary | ICD-10-CM

## 2020-07-30 NOTE — Progress Notes (Signed)
Office Visit Note   Patient: [name] [name]           Date of Birth: 08-20-1950           MRN: [id number] Visit Date: 07/30/2020              Requested by: Neale Burly, MD [place],  [place] [address] PCP: Neale Burly, MD  No chief complaint on file.     HPI: This is a pleasant 70 year old woman who presents with a 4-day history of left knee pain. She states she was painting a door when she stepped down off a stool and fell down onto her knees. She had some slight pain in her right knee but significant pain in her left knee. 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 She is still continuing to have pain mostly over the lateral border of the patella and the lateral joint line but globally the entire knee hurts  Assessment & Plan: Visit Diagnoses:  1. Acute pain of both knees     Plan: Cannot rule out nondisplaced fracture via the patella tibial plateau so we will place her in a knee immobilizer today she is to use her walker and refrain from weightbearing is much as possible. I told her that she could take the immobilizer off and do gentle range of motion of the knee. Follow-up in 1 week for reevaluation Follow-Up Instructions: No follow-ups on file.   Ortho Exam  Patient is alert, oriented, no adenopathy, well-dressed, normal affect, normal respiratory effort. Focused examination of her left knee she does not have an effusion she does have moderate soft tissue swelling no cellulitis or redness no breakdown in skin. She is acutely tender globally on the knee but specifically more so along the lateral border of the patella and the lateral joint line. She did not denies any significant pain to the right knee she is just mildly tender she does not have any effusion.  Imaging: No results found. No images are attached to the encounter.  Labs: Lab Results  Component Value Date   HGBA1C 5.8 (H) 04/19/2017   REPTSTATUS 08/11/2012 FINAL  08/10/2012   GRAMSTAIN  06/10/2010    ABUNDANT WBC PRESENT,BOTH PMN AND MONONUCLEAR NO ORGANISMS SEEN Performed at [facility]  06/10/2010    ABUNDANT WBC PRESENT,BOTH PMN AND MONONUCLEAR NO ORGANISMS SEEN Performed at [facility]  06/10/2010    ABUNDANT WBC PRESENT,BOTH PMN AND MONONUCLEAR NO ORGANISMS SEEN   CULT NO GROWTH 08/10/2012   LABORGA SERRATIA MARCESCENS 06/10/2010     Lab Results  Component Value Date   ALBUMIN 3.9 01/21/2007    No results found for: MG No results found for: VD25OH  No results found for: PREALBUMIN CBC EXTENDED Latest Ref Rng & Units 08/30/2017 05/17/2017 04/19/2017  WBC 4.0 - 10.5 K/uL 9.9 9.7 11.3(H)  RBC 3.87 - 5.11 Mil/uL 3.89 3.55(L) 3.87  HGB 12.0 - 15.0 g/dL 11.9(L) 11.6(L) 12.8  HCT 36.0 - 46.0 % 36.5 35.2(L) 38.4  PLT 150.0 - 400.0 K/uL 289.0 274 302  NEUTROABS 1.4 - 7.7 K/uL 6.3 - -  LYMPHSABS 0.7 - 4.0 K/uL 2.8 - -     There is no height or weight on file to calculate BMI.  Orders:  Orders Placed This Encounter  Procedures  . XR Knee 1-2 Views Left  . XR Knee 1-2 Views Right   No orders  of the defined types were placed in this encounter.    Procedures: No procedures performed  Clinical Data: No additional findings.  ROS:  All other systems negative, except as noted in the HPI. Review of Systems  Objective: Vital Signs: There were no vitals taken for this visit.  Specialty Comments:  No specialty comments available.  PMFS History: Patient Active Problem List   Diagnosis Date Noted  . Dyslipidemia 04/11/2020  . 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 Upper respiratory tract infection 06/17/2018  . 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 Aortic atherosclerosis ([place]) 04/25/2018  . Cervical stenosis of spinal canal 05/17/2017  . 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 At risk from passive smoking 04/18/2016  . Moderate persistent asthma  with acute exacerbation 04/18/2016  . 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 Chronic cough 11/06/2012  . Lung nodule 09/24/2012  . Mild persistent asthma, uncomplicated 30/16/0109  . Lumbar post-laminectomy syndrome 03/22/2012  . Depression with anxiety 09/29/2011  . Cervical post-laminectomy syndrome 08/28/2011  . Arachnoiditis 08/28/2011  . Cervical radicular pain 08/28/2011  . Anxiety 08/28/2011  . Headache   . Dyspnea 12/23/2010  . Hypertension    Past Medical History:  Diagnosis Date  . Acute sinusitis, unspecified   . Allergy   . Anemia    "quite a few times"  . Anxiety state, unspecified   . Arachnoiditis BILATERAL LEGS   DUE TO MULTIPLE BACK SURG.'S  . Arthritis   . Asthma    last flare up was 03/2017 lasted over a month  . Blood transfusion   . Cancer ([place])    skin cancers (in scalp)  . Cardiomyopathy HX --06/2010   EF was 25% during acute illness (PHELONEPHRITIS) Repeat echo 12-06-10 60% showed normal EF.   Marland Kitchen Chronic back pain greater than 3 months duration    S/P BACK SURG'S  . CSF leak   . Diabetes mellitus without complication ([place])    dx 2013 type 2  . Dyslipidemia   . Dysphagia    some post op cerv fusion 2/14  . Dysrhythmia   . Essential hypertension, benign   . Family history of adverse reaction to anesthesia    mother gets n/v  . GERD (gastroesophageal reflux disease) AND HIATIAL HERNIA   CONTROLLED W/ NEXIUM  . Headache(784.0)   . History of chronic bronchitis   . Hx of bladder infections   . Hyperlipidemia    TAKE CHLOSTEROL MEDICATION,04/23/19  . Hypertension   . Neuromuscular disorder (HCC)    numbness and tingling  . Osteoporosis   . Other malaise and fatigue   . PONV (postoperative nausea and vomiting)   . Shortness of breath   . Spinal headache   . Spinal stenosis, cervical region   . Varicosities    venous  . Weakness of both legs DUE TO ARACHNOIDITIS   OCCASIONAL USES CANE    Family History  Problem Relation Age of Onset  .  Hypertension Mother   . Heart disease Mother        Mitral valve replacement.   . Diabetes Mother   . Cancer Father   . Diabetes Brother   . Colon cancer Neg Hx   . Colon polyps Neg Hx   . Esophageal cancer Neg Hx   . Rectal cancer Neg Hx   . Stomach cancer Neg Hx     Past Surgical History:  Procedure Laterality Date  . ABDOMINAL HYSTERECTOMY  1987   W/ BSO  .  ANTERIOR CERVICAL DECOMP/DISCECTOMY FUSION N/A 08/09/2012   Procedure: ANTERIOR CERVICAL DECOMPRESSION/DISCECTOMY FUSION 1 LEVEL;  Surgeon: Floyce Stakes, MD;  [facility];  Service: Neurosurgery;  Laterality: N/A;  Cervical four-five  Anterior cervical decompression/diskectomy/fusion  . ANTERIOR FUSION CERVICAL SPINE  2007   C5 -6  . APPENDECTOMY    . APPLICATION OF INTRAOPERATIVE CT SCAN N/A 05/17/2017   Procedure: APPLICATION OF INTRAOPERATIVE CT SCAN;  Surgeon: Erline Levine, MD;  Location: [facility];  Service: Neurosurgery;  Laterality: N/A;  . BACK SURGERY     x 5  . BREAST EXCISIONAL BIOPSY Bilateral    No scar seen   . BREAST SURGERY     x 2 biopsies  . CARPAL TUNNEL RELEASE  RIGHT - 2001  & DEC 2011 W/ BACK SURG.  . CERVICAL DISC SURGERY  2005   C5 - 6  . CHOLECYSTECTOMY  1994  . DORSAL COMPARTMENT RELEASE Right 04/07/2013   Procedure: RIGHT WRIST STS RELEASE;  Surgeon: Schuyler Amor, MD;  Location: [place];  Service: Orthopedics;  Laterality: Right;  . ENDOVENOUS ABLATION SAPHENOUS VEIN W/ LASER Right 05/29/2019   endovenous laser ablation right greater saphenous vein by Gae Gallop MD   . [place] CSF LEAK  [place] 2011  X2   POST [place]  . FINGER ARTHRODESIS Right 04/07/2013   Procedure: RIGHT INDEX AND RIGHT LONG DISTAL INTERPHALANGEAL JOINT FUSIONS;  Surgeon: Schuyler Amor, MD;  Location: [place];  Service: Orthopedics;  Laterality: Right;  . FINGER ARTHROPLASTY Right 04/07/2013   Procedure: RIGHT THUMB [place] ARTHROPLASTY;   Surgeon: Schuyler Amor, MD;  Location: [place];  Service: Orthopedics;  Laterality: Right;  . GANGLION CYST EXCISION Right 04/07/2013   Procedure: RIGHT WRIST MASS EXCISION;  Surgeon: Schuyler Amor, MD;  Location: [place];  Service: Orthopedics;  Laterality: Right;  . KNEE ARTHROSCOPY  LEFT X3 (LAST ONE 2005)  . KNEE ARTHROSCOPY  05/17/2011   Procedure: ARTHROSCOPY KNEE;  Surgeon: Bradley Ferris III;  Location: [place];  Service: Orthopedics;  Laterality: Right;  WITH MEDIAL MENISECTOMY AND removal of suprapatella fat lump  . LEFT WRIST TENOSYNECTOMY W/ LEFT THUMB JOINT REPAIR  02-24-10  . LUMBAR FUSION  11-30-10   L4 - 5  . LUMBAR FUSION  2000   L2 - 4  . LUMBAR LAMINECTOMY  DEC 2011   L4 - 5  . POSTERIOR CERVICAL FUSION/FORAMINOTOMY N/A 05/17/2017   Procedure: Cervical five  to Thorastic one  Posterior cervical fusion with lateral mass screws/revision of prior instrumentation;  Surgeon: Erline Levine, MD;  Location: [place];  Service: Neurosurgery;  Laterality: N/A;  C5 to T1 Posterior cervical fusion with lateral mass screws/revision of prior instrumentation  . TENDON REPAIR  JAN 2010   LEFT INDEX AND LONG FINGERS  . THUMB SURGERY   04/07/2019   RIGHT THUMB  . TUBAL LIGATION     Social History   Occupational History  . Occupation: disable    Employer: DISABLED  Tobacco Use  . Smoking status: Never Smoker  . Smokeless tobacco: Never Used  Vaping Use  . Vaping Use: Never used  Substance and Sexual Activity  . Alcohol use: No  . Drug use: No  . Sexual activity: Not on file    Comment: second hand smoke

## 2020-08-01 ENCOUNTER — Other Ambulatory Visit: Payer: Self-pay | Admitting: Registered Nurse

## 2020-08-01 DIAGNOSIS — G039 Meningitis, unspecified: Secondary | ICD-10-CM

## 2020-08-01 DIAGNOSIS — M961 Postlaminectomy syndrome, not elsewhere classified: Secondary | ICD-10-CM

## 2020-08-05 ENCOUNTER — Ambulatory Visit (INDEPENDENT_AMBULATORY_CARE_PROVIDER_SITE_OTHER): Payer: Medicare Other | Admitting: Physician Assistant

## 2020-08-05 ENCOUNTER — Encounter: Payer: Self-pay | Admitting: Orthopedic Surgery

## 2020-08-05 DIAGNOSIS — M25562 Pain in left knee: Secondary | ICD-10-CM

## 2020-08-05 DIAGNOSIS — M25561 Pain in right knee: Secondary | ICD-10-CM

## 2020-08-05 NOTE — Progress Notes (Signed)
Office Visit Note   Patient: [name] [name]           Date of Birth: 02/07/1951           MRN: [id number] Visit Date: 08/05/2020              Requested by: Neale Burly, MD [place],  [place] [address] PCP: Neale Burly, MD  Chief Complaint  Patient presents with  . Left Knee - Follow-up    DOI 07/26/20 fall on knees       HPI: Patient presents today for follow-up on her knees.  She is status post a fall from a stool a little over a week ago.  At her last visit her left knee was quite tender especially around the kneecap.  She was placed in a knee immobilizer but she does not like the knee immobilizer as it she feels too confined.  She comes in today without it.  She continues to complain of pain circumferentially around the kneecap.  She is status post 3 arthroscopic surgeries on this knee  Assessment & Plan: Visit Diagnoses: No diagnosis found.  Plan: I think the patient after consultation should be referred to physical therapy.  She says right now this is very difficult for her to do because she is taking care of her mother and her children.  I did demonstrate quadricep close chain strengthening to her today.  She will follow-up in 3 weeks.  I also reviewed the x-rays with Dr. Sharol Given as well as her exam and this was his recommended plan  Follow-Up Instructions: No follow-ups on file.   Ortho Exam  Patient is alert, oriented, no adenopathy, well-dressed, normal affect, normal respiratory effort. Examination of her left knee demonstrates no effusion she does have some moderate soft tissue swelling over the kneecap.  She is focally tender over the medial patella will order and on the lateral patellar border.  No significant tenderness over the tibial plateau.  She is able to hold her leg out straight and fire her quad and patellar tendons  Imaging: No results found. No images are attached to the encounter.  Labs: Lab Results  Component Value Date   HGBA1C 5.8  (H) 04/19/2017   REPTSTATUS 08/11/2012 FINAL 08/10/2012   GRAMSTAIN  06/10/2010    ABUNDANT WBC PRESENT,BOTH PMN AND MONONUCLEAR NO ORGANISMS SEEN Performed at [facility]  06/10/2010    ABUNDANT WBC PRESENT,BOTH PMN AND MONONUCLEAR NO ORGANISMS SEEN Performed at [facility]  06/10/2010    ABUNDANT WBC PRESENT,BOTH PMN AND MONONUCLEAR NO ORGANISMS SEEN   CULT NO GROWTH 08/10/2012   LABORGA SERRATIA MARCESCENS 06/10/2010     Lab Results  Component Value Date   ALBUMIN 3.9 01/21/2007    No results found for: MG No results found for: VD25OH  No results found for: PREALBUMIN CBC EXTENDED Latest Ref Rng & Units 08/30/2017 05/17/2017 04/19/2017  WBC 4.0 - 10.5 K/uL 9.9 9.7 11.3(H)  RBC 3.87 - 5.11 Mil/uL 3.89 3.55(L) 3.87  HGB 12.0 - 15.0 g/dL 11.9(L) 11.6(L) 12.8  HCT 36.0 - 46.0 % 36.5 35.2(L) 38.4  PLT 150.0 - 400.0 K/uL 289.0 274 302  NEUTROABS 1.4 - 7.7 K/uL 6.3 - -  LYMPHSABS 0.7 - 4.0 K/uL 2.8 - -     There is no height or weight on file to calculate BMI.  Orders:  No orders of the defined types were placed in this  encounter.  No orders of the defined types were placed in this encounter.    Procedures: No procedures performed  Clinical Data: No additional findings.  ROS:  All other systems negative, except as noted in the HPI. Review of Systems  Objective: Vital Signs: There were no vitals taken for this visit.  Specialty Comments:  No specialty comments available.  PMFS History: Patient Active Problem List   Diagnosis Date Noted  . Dyslipidemia 04/11/2020  . 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 Upper respiratory tract infection 06/17/2018  . 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 Aortic atherosclerosis ([place]) 04/25/2018  . Cervical stenosis of spinal canal 05/17/2017  . 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 At risk from passive smoking 04/18/2016  . Moderate persistent  asthma with acute exacerbation 04/18/2016  . 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 Chronic cough 11/06/2012  . Lung nodule 09/24/2012  . Mild persistent asthma, uncomplicated 93/26/7124  . Lumbar post-laminectomy syndrome 03/22/2012  . Depression with anxiety 09/29/2011  . Cervical post-laminectomy syndrome 08/28/2011  . Arachnoiditis 08/28/2011  . Cervical radicular pain 08/28/2011  . Anxiety 08/28/2011  . Headache   . Dyspnea 12/23/2010  . Hypertension    Past Medical History:  Diagnosis Date  . Acute sinusitis, unspecified   . Allergy   . Anemia    "quite a few times"  . Anxiety state, unspecified   . Arachnoiditis BILATERAL LEGS   DUE TO MULTIPLE BACK SURG.'S  . Arthritis   . Asthma    last flare up was 03/2017 lasted over a month  . Blood transfusion   . Cancer ([place])    skin cancers (in scalp)  . Cardiomyopathy HX --06/2010   EF was 25% during acute illness (PHELONEPHRITIS) Repeat echo 12-06-10 60% showed normal EF.   Marland Kitchen Chronic back pain greater than 3 months duration    S/P BACK SURG'S  . CSF leak   . Diabetes mellitus without complication ([place])    dx 2013 type 2  . Dyslipidemia   . Dysphagia    some post op cerv fusion 2/14  . Dysrhythmia   . Essential hypertension, benign   . Family history of adverse reaction to anesthesia    mother gets n/v  . GERD (gastroesophageal reflux disease) AND HIATIAL HERNIA   CONTROLLED W/ NEXIUM  . Headache(784.0)   . History of chronic bronchitis   . Hx of bladder infections   . Hyperlipidemia    TAKE CHLOSTEROL MEDICATION,04/23/19  . Hypertension   . Neuromuscular disorder (HCC)    numbness and tingling  . Osteoporosis   . Other malaise and fatigue   . PONV (postoperative nausea and vomiting)   . Shortness of breath   . Spinal headache   . Spinal stenosis, cervical region   . Varicosities    venous  . Weakness of both legs DUE TO ARACHNOIDITIS   OCCASIONAL USES CANE    Family History  Problem Relation Age of Onset   . Hypertension Mother   . Heart disease Mother        Mitral valve replacement.   . Diabetes Mother   . Cancer Father   . Diabetes Brother   . Colon cancer Neg Hx   . Colon polyps Neg Hx   . Esophageal cancer Neg Hx   . Rectal cancer Neg Hx   . Stomach cancer Neg Hx     Past Surgical History:  Procedure Laterality Date  . ABDOMINAL HYSTERECTOMY  1987  W/ BSO  . ANTERIOR CERVICAL DECOMP/DISCECTOMY FUSION N/A 08/09/2012   Procedure: ANTERIOR CERVICAL DECOMPRESSION/DISCECTOMY FUSION 1 LEVEL;  Surgeon: Floyce Stakes, MD;  [facility];  Service: Neurosurgery;  Laterality: N/A;  Cervical four-five  Anterior cervical decompression/diskectomy/fusion  . ANTERIOR FUSION CERVICAL SPINE  2007   C5 -6  . APPENDECTOMY    . APPLICATION OF INTRAOPERATIVE CT SCAN N/A 05/17/2017   Procedure: APPLICATION OF INTRAOPERATIVE CT SCAN;  Surgeon: Erline Levine, MD;  Location: [facility];  Service: Neurosurgery;  Laterality: N/A;  . BACK SURGERY     x 5  . BREAST EXCISIONAL BIOPSY Bilateral    No scar seen   . BREAST SURGERY     x 2 biopsies  . CARPAL TUNNEL RELEASE  RIGHT - 2001  & DEC 2011 W/ BACK SURG.  . CERVICAL DISC SURGERY  2005   C5 - 6  . CHOLECYSTECTOMY  1994  . DORSAL COMPARTMENT RELEASE Right 04/07/2013   Procedure: RIGHT WRIST STS RELEASE;  Surgeon: Schuyler Amor, MD;  Location: [facility];  Service: Orthopedics;  Laterality: Right;  . ENDOVENOUS ABLATION SAPHENOUS VEIN W/ LASER Right 05/29/2019   endovenous laser ablation right greater saphenous vein by Gae Gallop MD   . [place] CSF LEAK  [place] 2011  X2   POST [place]  . FINGER ARTHRODESIS Right 04/07/2013   Procedure: RIGHT INDEX AND RIGHT LONG DISTAL INTERPHALANGEAL JOINT FUSIONS;  Surgeon: Schuyler Amor, MD;  Location: [facility];  Service: Orthopedics;  Laterality: Right;  . FINGER ARTHROPLASTY Right 04/07/2013   Procedure: RIGHT THUMB [place] ARTHROPLASTY;   Surgeon: Schuyler Amor, MD;  Location: [facility];  Service: Orthopedics;  Laterality: Right;  . GANGLION CYST EXCISION Right 04/07/2013   Procedure: RIGHT WRIST MASS EXCISION;  Surgeon: Schuyler Amor, MD;  Location: [facility];  Service: Orthopedics;  Laterality: Right;  . KNEE ARTHROSCOPY  LEFT X3 (LAST ONE 2005)  . KNEE ARTHROSCOPY  05/17/2011   Procedure: ARTHROSCOPY KNEE;  Surgeon: Bradley Ferris III;  Location: [facility];  Service: Orthopedics;  Laterality: Right;  WITH MEDIAL MENISECTOMY AND removal of suprapatella fat lump  . LEFT WRIST TENOSYNECTOMY W/ LEFT THUMB JOINT REPAIR  02-24-10  . LUMBAR FUSION  11-30-10   L4 - 5  . LUMBAR FUSION  2000   L2 - 4  . LUMBAR LAMINECTOMY  DEC 2011   L4 - 5  . POSTERIOR CERVICAL FUSION/FORAMINOTOMY N/A 05/17/2017   Procedure: Cervical five  to Thorastic one  Posterior cervical fusion with lateral mass screws/revision of prior instrumentation;  Surgeon: Erline Levine, MD;  Location: [facility];  Service: Neurosurgery;  Laterality: N/A;  C5 to T1 Posterior cervical fusion with lateral mass screws/revision of prior instrumentation  . TENDON REPAIR  JAN 2010   LEFT INDEX AND LONG FINGERS  . THUMB SURGERY   04/07/2019   RIGHT THUMB  . TUBAL LIGATION     Social History   Occupational History  . Occupation: disable    Employer: DISABLED  Tobacco Use  . Smoking status: Never Smoker  . Smokeless tobacco: Never Used  Vaping Use  . Vaping Use: Never used  Substance and Sexual Activity  . Alcohol use: No  . Drug use: No  . Sexual activity: Not on file    Comment: second hand smoke

## 2020-08-16 ENCOUNTER — Ambulatory Visit: Payer: Medicare Other | Admitting: Internal Medicine

## 2020-08-23 ENCOUNTER — Encounter: Payer: Self-pay | Admitting: Registered Nurse

## 2020-08-23 ENCOUNTER — Encounter: Payer: Self-pay | Admitting: Orthopedic Surgery

## 2020-08-23 ENCOUNTER — Encounter: Payer: Medicare Other | Attending: Physical Medicine & Rehabilitation | Admitting: Registered Nurse

## 2020-08-23 ENCOUNTER — Other Ambulatory Visit: Payer: Self-pay

## 2020-08-23 ENCOUNTER — Ambulatory Visit (INDEPENDENT_AMBULATORY_CARE_PROVIDER_SITE_OTHER): Payer: Medicare Other | Admitting: Physician Assistant

## 2020-08-23 VITALS — BP 119/59 | HR 88 | Temp 98.4°F | Ht 65.0 in | Wt 165.0 lb

## 2020-08-23 DIAGNOSIS — G039 Meningitis, unspecified: Secondary | ICD-10-CM | POA: Diagnosis not present

## 2020-08-23 DIAGNOSIS — G894 Chronic pain syndrome: Secondary | ICD-10-CM

## 2020-08-23 DIAGNOSIS — M5412 Radiculopathy, cervical region: Secondary | ICD-10-CM

## 2020-08-23 DIAGNOSIS — M25561 Pain in right knee: Secondary | ICD-10-CM | POA: Diagnosis not present

## 2020-08-23 DIAGNOSIS — M5416 Radiculopathy, lumbar region: Secondary | ICD-10-CM

## 2020-08-23 DIAGNOSIS — M25562 Pain in left knee: Secondary | ICD-10-CM

## 2020-08-23 DIAGNOSIS — M546 Pain in thoracic spine: Secondary | ICD-10-CM | POA: Diagnosis not present

## 2020-08-23 DIAGNOSIS — Z79891 Long term (current) use of opiate analgesic: Secondary | ICD-10-CM

## 2020-08-23 DIAGNOSIS — M542 Cervicalgia: Secondary | ICD-10-CM | POA: Diagnosis not present

## 2020-08-23 DIAGNOSIS — G8929 Other chronic pain: Secondary | ICD-10-CM

## 2020-08-23 DIAGNOSIS — Z5181 Encounter for therapeutic drug level monitoring: Secondary | ICD-10-CM | POA: Diagnosis not present

## 2020-08-23 DIAGNOSIS — M961 Postlaminectomy syndrome, not elsewhere classified: Secondary | ICD-10-CM | POA: Diagnosis not present

## 2020-08-23 MED ORDER — OXYCODONE HCL 10 MG PO TABS: 10.0000 mg | ORAL_TABLET | Freq: Four times a day (QID) | ORAL | 0 refills | Status: DC | PRN

## 2020-08-23 MED ORDER — METHYLPREDNISOLONE ACETATE 40 MG/ML IJ SUSP
40.0000 mg | INTRAMUSCULAR | Status: AC | PRN
  Administered 2020-08-23: 40 mg via INTRA_ARTICULAR

## 2020-08-23 MED ORDER — OXYMORPHONE HCL ER 40 MG PO TB12: 40.0000 mg | ORAL_TABLET | Freq: Two times a day (BID) | ORAL | 0 refills | Status: DC

## 2020-08-23 MED ORDER — LIDOCAINE HCL 1 % IJ SOLN
1.0000 mL | INTRAMUSCULAR | Status: AC | PRN
  Administered 2020-08-23: 1 mL

## 2020-08-23 NOTE — Progress Notes (Signed)
Subjective:    Patient ID: [name] [name], female    DOB: April 26, 1951, 70 y.o.   MRN: [id number]  HPI: [name] [name] is a 70 y.o. female who returns for follow up appointment for chronic pain and medication refill. She states her  pain is located in her neck radiating into her bilateral shoulders, mid- lower back radiating into her bilateral lower extremities. Also reports left knee pain. She rates her pain 7. Her current exercise regime is walking and performing stretching exercises.  [name] [name] Morphine equivalent is 300.00 MME.  Last UDS was Performed on 04/30/2020, it was consistent.    Pain Inventory Average Pain 7 Pain Right Now 7 My pain is sharp, burning, stabbing and tingling  In the last 24 hours, has pain interfered with the following? General activity 6 Relation with others 7 Enjoyment of life 6 What TIME of day is your pain at its worst? morning , daytime, evening and night Sleep (in general) Fair  Pain is worse with: walking, bending, sitting, inactivity, standing and some activites Pain improves with: rest, heat/ice and medication Relief from Meds: 7  Family History  Problem Relation Age of Onset  . Hypertension Mother   . Heart disease Mother        Mitral valve replacement.   . Diabetes Mother   . Cancer Father   . Diabetes Brother   . Colon cancer Neg Hx   . Colon polyps Neg Hx   . Esophageal cancer Neg Hx   . Rectal cancer Neg Hx   . Stomach cancer Neg Hx    Social History   Socioeconomic History  . Marital status: Divorced    Spouse name: Not on file  . Number of children: Not on file  . Years of education: Not on file  . Highest education level: Not on file  Occupational History  . Occupation: disable    Employer: DISABLED  Tobacco Use  . Smoking status: Never Smoker  . Smokeless tobacco: Never Used  Vaping Use  . Vaping Use: Never used  Substance and Sexual Activity  . Alcohol use: No  . Drug use: No  . Sexual activity: Not on file     Comment: second hand smoke  Other Topics Concern  . Not on file  Social History Narrative   Has one child lives at home with her.    Social Determinants of Health   Financial Resource Strain: Not on file  Food Insecurity: Not on file  Transportation Needs: Not on file  Physical Activity: Not on file  Stress: Not on file  Social Connections: Not on file   Past Surgical History:  Procedure Laterality Date  . ABDOMINAL HYSTERECTOMY  1987   W/ BSO  . ANTERIOR CERVICAL DECOMP/DISCECTOMY FUSION N/A 08/09/2012   Procedure: ANTERIOR CERVICAL DECOMPRESSION/DISCECTOMY FUSION 1 LEVEL;  Surgeon: Floyce Stakes, MD;  [facility];  Service: Neurosurgery;  Laterality: N/A;  Cervical four-five  Anterior cervical decompression/diskectomy/fusion  . ANTERIOR FUSION CERVICAL SPINE  2007   C5 -6  . APPENDECTOMY    . APPLICATION OF INTRAOPERATIVE CT SCAN N/A 05/17/2017   Procedure: APPLICATION OF INTRAOPERATIVE CT SCAN;  Surgeon: Erline Levine, MD;  Location: [facility];  Service: Neurosurgery;  Laterality: N/A;  . BACK SURGERY     x 5  . BREAST EXCISIONAL BIOPSY Bilateral    No scar seen   . BREAST SURGERY     x 2 biopsies  . CARPAL TUNNEL RELEASE  RIGHT - 2001  & DEC 2011 W/ BACK SURG.  . CERVICAL DISC SURGERY  2005   C5 - 6  . CHOLECYSTECTOMY  1994  . DORSAL COMPARTMENT RELEASE Right 04/07/2013   Procedure: RIGHT WRIST STS RELEASE;  Surgeon: Schuyler Amor, MD;  Location: [facility];  Service: Orthopedics;  Laterality: Right;  . ENDOVENOUS ABLATION SAPHENOUS VEIN W/ LASER Right 05/29/2019   endovenous laser ablation right greater saphenous vein by Gae Gallop MD   . [place] CSF LEAK  [place] 2011  X2   POST [place]  . FINGER ARTHRODESIS Right 04/07/2013   Procedure: RIGHT INDEX AND RIGHT LONG DISTAL INTERPHALANGEAL JOINT FUSIONS;  Surgeon: Schuyler Amor, MD;  Location: [facility];  Service: Orthopedics;  Laterality: Right;   . FINGER ARTHROPLASTY Right 04/07/2013   Procedure: RIGHT THUMB [place] ARTHROPLASTY;  Surgeon: Schuyler Amor, MD;  Location: [facility];  Service: Orthopedics;  Laterality: Right;  . GANGLION CYST EXCISION Right 04/07/2013   Procedure: RIGHT WRIST MASS EXCISION;  Surgeon: Schuyler Amor, MD;  Location: [facility];  Service: Orthopedics;  Laterality: Right;  . KNEE ARTHROSCOPY  LEFT X3 (LAST ONE 2005)  . KNEE ARTHROSCOPY  05/17/2011   Procedure: ARTHROSCOPY KNEE;  Surgeon: Bradley Ferris III;  Location: [facility];  Service: Orthopedics;  Laterality: Right;  WITH MEDIAL MENISECTOMY AND removal of suprapatella fat lump  . LEFT WRIST TENOSYNECTOMY W/ LEFT THUMB JOINT REPAIR  02-24-10  . LUMBAR FUSION  11-30-10   L4 - 5  . LUMBAR FUSION  2000   L2 - 4  . LUMBAR LAMINECTOMY  DEC 2011   L4 - 5  . POSTERIOR CERVICAL FUSION/FORAMINOTOMY N/A 05/17/2017   Procedure: Cervical five  to Thorastic one  Posterior cervical fusion with lateral mass screws/revision of prior instrumentation;  Surgeon: Erline Levine, MD;  Location: [facility];  Service: Neurosurgery;  Laterality: N/A;  C5 to T1 Posterior cervical fusion with lateral mass screws/revision of prior instrumentation  . TENDON REPAIR  JAN 2010   LEFT INDEX AND LONG FINGERS  . THUMB SURGERY   04/07/2019   RIGHT THUMB  . TUBAL LIGATION     Past Surgical History:  Procedure Laterality Date  . ABDOMINAL HYSTERECTOMY  1987   W/ BSO  . ANTERIOR CERVICAL DECOMP/DISCECTOMY FUSION N/A 08/09/2012   Procedure: ANTERIOR CERVICAL DECOMPRESSION/DISCECTOMY FUSION 1 LEVEL;  Surgeon: Floyce Stakes, MD;  [facility];  Service: Neurosurgery;  Laterality: N/A;  Cervical four-five  Anterior cervical decompression/diskectomy/fusion  . ANTERIOR FUSION CERVICAL SPINE  2007   C5 -6  . APPENDECTOMY    . APPLICATION OF INTRAOPERATIVE CT SCAN N/A 05/17/2017   Procedure: APPLICATION OF INTRAOPERATIVE CT SCAN;   Surgeon: Erline Levine, MD;  Location: [facility];  Service: Neurosurgery;  Laterality: N/A;  . BACK SURGERY     x 5  . BREAST EXCISIONAL BIOPSY Bilateral    No scar seen   . BREAST SURGERY     x 2 biopsies  . CARPAL TUNNEL RELEASE  RIGHT - 2001  & DEC 2011 W/ BACK SURG.  . CERVICAL DISC SURGERY  2005   C5 - 6  . CHOLECYSTECTOMY  1994  . DORSAL COMPARTMENT RELEASE Right 04/07/2013   Procedure: RIGHT WRIST STS RELEASE;  Surgeon: Schuyler Amor, MD;  Location: [facility];  Service: Orthopedics;  Laterality: Right;  . ENDOVENOUS ABLATION SAPHENOUS VEIN W/ LASER Right  05/29/2019   endovenous laser ablation right greater saphenous vein by Gae Gallop MD   . [place] CSF LEAK  [place] 2011  X2   POST [place]  . FINGER ARTHRODESIS Right 04/07/2013   Procedure: RIGHT INDEX AND RIGHT LONG DISTAL INTERPHALANGEAL JOINT FUSIONS;  Surgeon: Schuyler Amor, MD;  Location: [facility];  Service: Orthopedics;  Laterality: Right;  . FINGER ARTHROPLASTY Right 04/07/2013   Procedure: RIGHT THUMB [place] ARTHROPLASTY;  Surgeon: Schuyler Amor, MD;  Location: [facility];  Service: Orthopedics;  Laterality: Right;  . GANGLION CYST EXCISION Right 04/07/2013   Procedure: RIGHT WRIST MASS EXCISION;  Surgeon: Schuyler Amor, MD;  Location: [facility];  Service: Orthopedics;  Laterality: Right;  . KNEE ARTHROSCOPY  LEFT X3 (LAST ONE 2005)  . KNEE ARTHROSCOPY  05/17/2011   Procedure: ARTHROSCOPY KNEE;  Surgeon: Bradley Ferris III;  Location: [facility];  Service: Orthopedics;  Laterality: Right;  WITH MEDIAL MENISECTOMY AND removal of suprapatella fat lump  . LEFT WRIST TENOSYNECTOMY W/ LEFT THUMB JOINT REPAIR  02-24-10  . LUMBAR FUSION  11-30-10   L4 - 5  . LUMBAR FUSION  2000   L2 - 4  . LUMBAR LAMINECTOMY  DEC 2011   L4 - 5  . POSTERIOR CERVICAL FUSION/FORAMINOTOMY N/A 05/17/2017   Procedure: Cervical five   to Thorastic one  Posterior cervical fusion with lateral mass screws/revision of prior instrumentation;  Surgeon: Erline Levine, MD;  Location: [facility];  Service: Neurosurgery;  Laterality: N/A;  C5 to T1 Posterior cervical fusion with lateral mass screws/revision of prior instrumentation  . TENDON REPAIR  JAN 2010   LEFT INDEX AND LONG FINGERS  . THUMB SURGERY   04/07/2019   RIGHT THUMB  . TUBAL LIGATION     Past Medical History:  Diagnosis Date  . Acute sinusitis, unspecified   . Allergy   . Anemia    "quite a few times"  . Anxiety state, unspecified   . Arachnoiditis BILATERAL LEGS   DUE TO MULTIPLE BACK SURG.'S  . Arthritis   . Asthma    last flare up was 03/2017 lasted over a month  . Blood transfusion   . Cancer ([place])    skin cancers (in scalp)  . Cardiomyopathy HX --06/2010   EF was 25% during acute illness (PHELONEPHRITIS) Repeat echo 12-06-10 60% showed normal EF.   Marland Kitchen Chronic back pain greater than 3 months duration    S/P BACK SURG'S  . CSF leak   . Diabetes mellitus without complication ([place])    dx 2013 type 2  . Dyslipidemia   . Dysphagia    some post op cerv fusion 2/14  . Dysrhythmia   . Essential hypertension, benign   . Family history of adverse reaction to anesthesia    mother gets n/v  . GERD (gastroesophageal reflux disease) AND HIATIAL HERNIA   CONTROLLED W/ NEXIUM  . Headache(784.0)   . History of chronic bronchitis   . Hx of bladder infections   . Hyperlipidemia    TAKE CHLOSTEROL MEDICATION,04/23/19  . Hypertension   . Neuromuscular disorder (HCC)    numbness and tingling  . Osteoporosis   . Other malaise and fatigue   . PONV (postoperative nausea and vomiting)   . Shortness of breath   . Spinal headache   . Spinal stenosis, cervical region   . Varicosities    venous  . Weakness of both legs DUE  TO ARACHNOIDITIS   OCCASIONAL USES CANE   BP (!) 119/59   Pulse 88   Temp 98.4 F (36.9 C)   Ht 5\' 5"  (1.651 m)   Wt 165 lb (74.8 kg)   SpO2  97%   BMI 27.46 kg/m   Opioid Risk Score:   Fall Risk Score:  `1  Depression screen PHQ 2/9  Depression screen [facility] 2/9 07/28/2020 05/31/2020 03/03/2020 01/02/2020 10/13/2019 06/25/2019 10/30/2018  Decreased Interest 0 0 0 0 0 0 0  Down, Depressed, Hopeless 0 0 0 0 0 0 0  PHQ - 2 Score 0 0 0 0 0 0 0  Altered sleeping - - - - - - -  Tired, decreased energy - - - - - - -  Change in appetite - - - - - - -  Feeling bad or failure about yourself  - - - - - - -  Trouble concentrating - - - - - - -  Moving slowly or fidgety/restless - - - - - - -  Suicidal thoughts - - - - - - -  PHQ-9 Score - - - - - - -  Some recent data might be hidden    Review of Systems  Constitutional: Negative.   HENT: Negative.   Eyes: Negative.   Respiratory: Negative.   Cardiovascular: Negative.   Gastrointestinal: Negative.   Endocrine: Negative.   Genitourinary: Negative.   Musculoskeletal: Positive for arthralgias, back pain, myalgias, neck pain and neck stiffness.  Skin: Negative.   Neurological: Negative.   Hematological: Negative.   Psychiatric/Behavioral: Negative.   All other systems reviewed and are negative.      Objective:   Physical Exam Vitals and nursing note reviewed.  Constitutional:      Appearance: Normal appearance.  Neck:     Comments: Cervical Paraspinal Tenderness: C-5-C-6 Cardiovascular:     Rate and Rhythm: Normal rate and regular rhythm.     Pulses: Normal pulses.     Heart sounds: Normal heart sounds.  Pulmonary:     Effort: Pulmonary effort is normal.     Breath sounds: Normal breath sounds.  Musculoskeletal:     Cervical back: Normal range of motion and neck supple.     Comments: Normal Muscle Bulk and Muscle Testing Reveals:  Upper Extremities: Full ROM and Muscle Strength on Right 3/ 5 and Left 5/5 Bilateral AC Joint Tenderness Thoracic Paraspinal Tenderness: T-7-T-9 Lumbar Paraspinal Tenderness: L-3-L-5 Bilateral Greater Trochanter Tenderness Lower Extremities:  Full ROM and Muscle Strength 5/5 Left Lower Extremity Flexion Produces Pain into my Patella Arises from Table with ease  Narrow Based  Gait   Skin:    General: Skin is warm and dry.  Neurological:     Mental Status: She is alert and oriented to person, place, and time.  Psychiatric:        Mood and Affect: Mood normal.        Behavior: Behavior normal.           Assessment & Plan:  1. On 05/17/2017 :C5C6C7 T1 Posterior Cervical Fusion with lateral mass fixation with AIRO Imaging, revision of prior instrumentation. By Dr. Vertell Limber.  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 Continuecurrent medication regimen withGabapentin800 mgBID.Refilled:Oxycodone 10mg  one tablet every 6 hours as needed #120tabletsand Oxymorphone HCL 40 mg every 12 hours #60.08/23/2020 We will continue the opioid monitoring program, this consists of regular clinic visits, examinations, urine drug screen, pill counts as well as use of [place] Controlled  Substance Reporting system. 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 Anxiety/depression: PCP Following. Continueto monitor.08/23/2020 4. Muscle Spasms: Continuecurrent medication regime withFlexeril. Continue to Monitor.08/23/2020 5. Cervicalgia/ Cervical Radiculitis: Dr SternFollowing.,Continuecurrent medication regime withGabapentin: S/P C5C6C7 T1 Posterior Cervical Fusion with lateral mass fixation with AIRO Imaging, revision of prior instrumentation by Dr. Vertell Limber on 05/17/2017.08/23/2020 7. Bilateral Thoracic Back Pain:Continue HEP as Tolerated and Continue current medication regimen. Continue to Monitor.08/23/2020 8. Left lower extremity DVT/ Phlebitis: S/P endovenous laser  ablation of left great saphenous vein on 05/09/2018 by Dr. Scot Dock: Vascular Following.08/23/2020. 9. Insomnia:ContinuePamelor. Continue to Monitor.08/23/2020.  F/U in 1 month

## 2020-08-23 NOTE — Progress Notes (Addendum)
Office Visit Note   Patient: [name] [name]           Date of Birth: 1951/03/03           MRN: [id number] Visit Date: 08/23/2020              Requested by: Neale Burly, MD [place],  [place] [id number] PCP: Neale Burly, MD  Chief Complaint  Patient presents with  . Right Knee - Follow-up    DOI 07/26/20 bilateral knee s/p fall   . Left Knee - Follow-up      HPI: Patient today presents with follow-up of her bilateral knee pain.  She states her right knee symptoms have completely resolved.  While the left had improved.  She continues to have some swelling right on the front of her knee that she finds painful.  Denies any swelling on the joint line or pain on the joint line  Assessment & Plan: Visit Diagnoses: No diagnosis found.  Plan: Patient will keep a compression wrap on this with an Ace wrap.  May use ice will follow-up for reevaluation in 3 weeks sooner if she has any difficulties  Follow-Up Instructions: No follow-ups on file.   Ortho Exam  Patient is alert, oriented, no adenopathy, well-dressed, normal affect, normal respiratory effort. Focused examination of her left knee no effusion no erythema no cellulitis she does have a fluid-filled prepatellar bursa.  There is no signs of infection with this no skin breakdown.  After verbal consent we went forward with aspiration.  5 cc of serous blood-tinged fluid was aspirated.  Could not aspirate anything further.  Tried milking the area nothing further aspirated as well  Imaging: No results found. No images are attached to the encounter.  Labs: Lab Results  Component Value Date   HGBA1C 5.8 (H) 04/19/2017   REPTSTATUS 08/11/2012 FINAL 08/10/2012   GRAMSTAIN  06/10/2010    ABUNDANT WBC PRESENT,BOTH PMN AND MONONUCLEAR NO ORGANISMS SEEN Performed at [facility]  06/10/2010    ABUNDANT WBC PRESENT,BOTH PMN AND MONONUCLEAR NO ORGANISMS SEEN Performed at [facility]  06/10/2010    ABUNDANT WBC PRESENT,BOTH PMN AND MONONUCLEAR NO ORGANISMS SEEN   CULT NO GROWTH 08/10/2012   LABORGA SERRATIA MARCESCENS 06/10/2010     Lab Results  Component Value Date   ALBUMIN 3.9 01/21/2007    No results found for: MG No results found for: VD25OH  No results found for: PREALBUMIN CBC EXTENDED Latest Ref Rng & Units 08/30/2017 05/17/2017 04/19/2017  WBC 4.0 - 10.5 K/uL 9.9 9.7 11.3(H)  RBC 3.87 - 5.11 Mil/uL 3.89 3.55(L) 3.87  HGB 12.0 - 15.0 g/dL 11.9(L) 11.6(L) 12.8  HCT 36.0 - 46.0 % 36.5 35.2(L) 38.4  PLT 150.0 - 400.0 K/uL 289.0 274 302  NEUTROABS 1.4 - 7.7 K/uL 6.3 - -  LYMPHSABS 0.7 - 4.0 K/uL 2.8 - -     There is no height or weight on file to calculate BMI.  Orders:  No orders of the defined types were placed in this encounter.  No orders of the defined types were placed in this encounter.    Procedures: Large Joint Inj: L knee on 08/23/2020 2:17 PM Indications: pain and diagnostic evaluation Details: 22 G 1.5 in needle, anterior approach  Arthrogram: No  Medications: 40 mg methylPREDNISolone acetate 40 MG/ML; 1 mL lidocaine 1 % Aspirate: serous and blood-tinged Outcome: tolerated well, no immediate complications Procedure,  treatment alternatives, risks and benefits explained, specific risks discussed. Consent was given by the patient.      Clinical Data: No additional findings.  ROS:  All other systems negative, except as noted in the HPI. Review of Systems  Objective: Vital Signs: There were no vitals taken for this visit.  Specialty Comments:  No specialty comments available.  PMFS History: Patient Active Problem List   Diagnosis Date Noted  . Dyslipidemia 04/11/2020  . 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 Upper respiratory tract infection 06/17/2018  . 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 Aortic atherosclerosis ([place]) 04/25/2018  . Cervical stenosis of spinal  canal 05/17/2017  . 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 At risk from passive smoking 04/18/2016  . Moderate persistent asthma with acute exacerbation 04/18/2016  . 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 Chronic cough 11/06/2012  . Lung nodule 09/24/2012  . Mild persistent asthma, uncomplicated 48/54/6270  . Lumbar post-laminectomy syndrome 03/22/2012  . Depression with anxiety 09/29/2011  . Cervical post-laminectomy syndrome 08/28/2011  . Arachnoiditis 08/28/2011  . Cervical radicular pain 08/28/2011  . Anxiety 08/28/2011  . Headache   . Dyspnea 12/23/2010  . Hypertension    Past Medical History:  Diagnosis Date  . Acute sinusitis, unspecified   . Allergy   . Anemia    "quite a few times"  . Anxiety state, unspecified   . Arachnoiditis BILATERAL LEGS   DUE TO MULTIPLE BACK SURG.'S  . Arthritis   . Asthma    last flare up was 03/2017 lasted over a month  . Blood transfusion   . Cancer ([place])    skin cancers (in scalp)  . Cardiomyopathy HX --06/2010   EF was 25% during acute illness (PHELONEPHRITIS) Repeat echo 12-06-10 60% showed normal EF.   Marland Kitchen Chronic back pain greater than 3 months duration    S/P BACK SURG'S  . CSF leak   . Diabetes mellitus without complication ([place])    dx 2013 type 2  . Dyslipidemia   . Dysphagia    some post op cerv fusion 2/14  . Dysrhythmia   . Essential hypertension, benign   . Family history of adverse reaction to anesthesia    mother gets n/v  . GERD (gastroesophageal reflux disease) AND HIATIAL HERNIA   CONTROLLED W/ NEXIUM  . Headache(784.0)   . History of chronic bronchitis   . Hx of bladder infections   . Hyperlipidemia    TAKE CHLOSTEROL MEDICATION,04/23/19  . Hypertension   . Neuromuscular disorder (HCC)    numbness and tingling  . Osteoporosis   . Other malaise and fatigue   . PONV (postoperative nausea and vomiting)   . Shortness of breath   . Spinal headache   . Spinal stenosis, cervical region   . Varicosities    venous  .  Weakness of both legs DUE TO ARACHNOIDITIS   OCCASIONAL USES CANE    Family History  Problem Relation Age of Onset  . Hypertension Mother   . Heart disease Mother        Mitral valve replacement.   . Diabetes Mother   . Cancer Father   . Diabetes Brother   . Colon cancer Neg Hx   . Colon polyps Neg Hx   . Esophageal cancer Neg Hx   . Rectal cancer Neg Hx   . Stomach cancer Neg Hx     Past Surgical History:  Procedure Laterality Date  . ABDOMINAL HYSTERECTOMY  1987   W/  BSO  . ANTERIOR CERVICAL DECOMP/DISCECTOMY FUSION N/A 08/09/2012   Procedure: ANTERIOR CERVICAL DECOMPRESSION/DISCECTOMY FUSION 1 LEVEL;  Surgeon: Floyce Stakes, MD;  Location: [facility];  Service: Neurosurgery;  Laterality: N/A;  Cervical four-five  Anterior cervical decompression/diskectomy/fusion  . ANTERIOR FUSION CERVICAL SPINE  2007   C5 -6  . APPENDECTOMY    . APPLICATION OF INTRAOPERATIVE CT SCAN N/A 05/17/2017   Procedure: APPLICATION OF INTRAOPERATIVE CT SCAN;  Surgeon: Erline Levine, MD;  Location: [facility];  Service: Neurosurgery;  Laterality: N/A;  . BACK SURGERY     x 5  . BREAST EXCISIONAL BIOPSY Bilateral    No scar seen   . BREAST SURGERY     x 2 biopsies  . CARPAL TUNNEL RELEASE  RIGHT - 2001  & DEC 2011 W/ BACK SURG.  . CERVICAL DISC SURGERY  2005   C5 - 6  . CHOLECYSTECTOMY  1994  . DORSAL COMPARTMENT RELEASE Right 04/07/2013   Procedure: RIGHT WRIST STS RELEASE;  Surgeon: Schuyler Amor, MD;  Location: [facility];  Service: Orthopedics;  Laterality: Right;  . ENDOVENOUS ABLATION SAPHENOUS VEIN W/ LASER Right 05/29/2019   endovenous laser ablation right greater saphenous vein by Gae Gallop MD   . [place] CSF LEAK  [place] 2011  X2   POST [place]  . FINGER ARTHRODESIS Right 04/07/2013   Procedure: RIGHT INDEX AND RIGHT LONG DISTAL INTERPHALANGEAL JOINT FUSIONS;  Surgeon: Schuyler Amor, MD;  Location: [facility];  Service:  Orthopedics;  Laterality: Right;  . FINGER ARTHROPLASTY Right 04/07/2013   Procedure: RIGHT THUMB [place] ARTHROPLASTY;  Surgeon: Schuyler Amor, MD;  Location: [facility];  Service: Orthopedics;  Laterality: Right;  . GANGLION CYST EXCISION Right 04/07/2013   Procedure: RIGHT WRIST MASS EXCISION;  Surgeon: Schuyler Amor, MD;  Location: [facility];  Service: Orthopedics;  Laterality: Right;  . KNEE ARTHROSCOPY  LEFT X3 (LAST ONE 2005)  . KNEE ARTHROSCOPY  05/17/2011   Procedure: ARTHROSCOPY KNEE;  Surgeon: Bradley Ferris III;  Location: [facility];  Service: Orthopedics;  Laterality: Right;  WITH MEDIAL MENISECTOMY AND removal of suprapatella fat lump  . LEFT WRIST TENOSYNECTOMY W/ LEFT THUMB JOINT REPAIR  02-24-10  . LUMBAR FUSION  11-30-10   L4 - 5  . LUMBAR FUSION  2000   L2 - 4  . LUMBAR LAMINECTOMY  DEC 2011   L4 - 5  . POSTERIOR CERVICAL FUSION/FORAMINOTOMY N/A 05/17/2017   Procedure: Cervical five  to Thorastic one  Posterior cervical fusion with lateral mass screws/revision of prior instrumentation;  Surgeon: Erline Levine, MD;  Location: [facility];  Service: Neurosurgery;  Laterality: N/A;  C5 to T1 Posterior cervical fusion with lateral mass screws/revision of prior instrumentation  . TENDON REPAIR  JAN 2010   LEFT INDEX AND LONG FINGERS  . THUMB SURGERY   04/07/2019   RIGHT THUMB  . TUBAL LIGATION     Social History   Occupational History  . Occupation: disable    Employer: DISABLED  Tobacco Use  . Smoking status: Never Smoker  . Smokeless tobacco: Never Used  Vaping Use  . Vaping Use: Never used  Substance and Sexual Activity  . Alcohol use: No  . Drug use: No  . Sexual activity: Not on file    Comment: second hand smoke

## 2020-08-24 ENCOUNTER — Other Ambulatory Visit: Payer: Self-pay | Admitting: Registered Nurse

## 2020-08-24 ENCOUNTER — Telehealth: Payer: Self-pay

## 2020-08-24 ENCOUNTER — Telehealth: Payer: Self-pay | Admitting: Registered Nurse

## 2020-08-24 DIAGNOSIS — M5412 Radiculopathy, cervical region: Secondary | ICD-10-CM

## 2020-08-24 MED ORDER — OXYMORPHONE HCL ER 40 MG PO TB12: 40.0000 mg | ORAL_TABLET | Freq: Two times a day (BID) | ORAL | 0 refills | Status: DC

## 2020-08-24 NOTE — Telephone Encounter (Signed)
Please call Laurey Arrow, Pharmacists at [facility] [phone]. Oxymorphone 10 MG is not currently  available. He would like to discuss a possible Rx change.   Thank you

## 2020-08-24 NOTE — Telephone Encounter (Signed)
Return [facility] Call: [name] [name] is unavailable, he';s in a meeting. They don't have Oxymorphone at this time. She was prescribed Oxycontin in the past and it was ineffective.

## 2020-08-24 NOTE — Telephone Encounter (Signed)
[facility] unable to fill Ms. Seebeck Oxymorphone, medication not available. Prescription cancelled and sent to [facility]. Ms. Jagodzinski is aware of the above.

## 2020-08-26 DIAGNOSIS — I10 Essential (primary) hypertension: Secondary | ICD-10-CM | POA: Diagnosis not present

## 2020-08-26 DIAGNOSIS — E782 Mixed hyperlipidemia: Secondary | ICD-10-CM | POA: Diagnosis not present

## 2020-08-26 DIAGNOSIS — E1143 Type 2 diabetes mellitus with diabetic autonomic (poly)neuropathy: Secondary | ICD-10-CM | POA: Diagnosis not present

## 2020-09-08 ENCOUNTER — Other Ambulatory Visit: Payer: Self-pay | Admitting: *Deleted

## 2020-09-10 ENCOUNTER — Telehealth: Payer: Self-pay | Admitting: Registered Nurse

## 2020-09-10 NOTE — Telephone Encounter (Signed)
Clarification For Xtampza:  Xtampza 36 mg every 12 hours.  [name] [name] Current Morphine Equivalent is 300.00 MME.  Xtampza 36 mg  Every 12 hours = 120.00  MME Oxycodone 10 mg  Every 6 hours = 60.00  The above will equal 180.00 MME If Oxycodone is increased to 15 mg every 6 hours will = 90.00 MME + Xtampza would = 210.00 MME.  Will speak with Dr Naaman Plummer regarding the above on next week. [name] [name] is aware and verbalizes understanding.

## 2020-09-10 NOTE — Telephone Encounter (Signed)
Placed a call to Ms. Glidden regarding her oxymorphone medication. This provider spoke with Dr Naaman Plummer on Wednesday 09/08/2020, regarding Ms. Pixler not able to get her Oxymorphone filled , this provider called several pharmacies and they will not be ordering the oxymorphone due to cost. PMP was reviewed for the last 5 years. Ms. Vogler will be chaged to Xtampza 60 mg one capsule every 12 hours #60. Will ask Mancel Parsons regarding a PA, she verbalizes understanding.

## 2020-09-13 ENCOUNTER — Ambulatory Visit: Payer: Medicare Other | Admitting: Orthopedic Surgery

## 2020-09-16 ENCOUNTER — Telehealth: Payer: Self-pay | Admitting: Registered Nurse

## 2020-09-16 MED ORDER — XTAMPZA ER 36 MG PO C12A: 1.0000 | EXTENDED_RELEASE_CAPSULE | Freq: Two times a day (BID) | ORAL | 0 refills | Status: DC

## 2020-09-16 MED ORDER — OXYCODONE HCL 15 MG PO TABS: 15.0000 mg | ORAL_TABLET | Freq: Every day | ORAL | 0 refills | Status: DC | PRN

## 2020-09-16 NOTE — Telephone Encounter (Signed)
This provider was in contact with Dr Naaman Plummer regarding Ms. Greis difficulty of getting her  oxymorphone filled by pharmacies. This provider spoke with many pharmacists at different pharmacy and they stated" it's the cost".  Ms. Mamone current MME = 300.00 MME Her current medication is Oxymorphone 40 mg q 12 hours and Oxycodone 10 mg 4 times a day as needed for pain.  Her medication will be changed to Xtampza 36 mg every 12 hours and Oxycodone 15 mg  5 times a day as needed for pain . Her MME will be changed to 220.50 MME. Dr Naaman Plummer is aware of the above and agrees with plan.  This provider placed a call to Ms. Trotter no answer awaiting a return call.  This provider spoke with pharmacist at [facility] regarding the above, she is aware of the change.

## 2020-09-17 ENCOUNTER — Telehealth: Payer: Self-pay | Admitting: *Deleted

## 2020-09-17 NOTE — Telephone Encounter (Signed)
Prior authorization for Xtampza 36 mg ER approved through 09/17/2021

## 2020-09-22 ENCOUNTER — Encounter: Payer: Self-pay | Admitting: Physician Assistant

## 2020-09-22 ENCOUNTER — Ambulatory Visit (INDEPENDENT_AMBULATORY_CARE_PROVIDER_SITE_OTHER): Payer: Medicare Other | Admitting: Physician Assistant

## 2020-09-22 ENCOUNTER — Encounter: Payer: Medicare Other | Attending: Physical Medicine & Rehabilitation | Admitting: Registered Nurse

## 2020-09-22 ENCOUNTER — Other Ambulatory Visit: Payer: Self-pay

## 2020-09-22 ENCOUNTER — Encounter: Payer: Self-pay | Admitting: Registered Nurse

## 2020-09-22 VITALS — BP 117/74 | HR 100 | Temp 98.8°F | Ht 64.0 in | Wt 161.6 lb

## 2020-09-22 DIAGNOSIS — Z79891 Long term (current) use of opiate analgesic: Secondary | ICD-10-CM | POA: Insufficient documentation

## 2020-09-22 DIAGNOSIS — G039 Meningitis, unspecified: Secondary | ICD-10-CM | POA: Insufficient documentation

## 2020-09-22 DIAGNOSIS — Z5181 Encounter for therapeutic drug level monitoring: Secondary | ICD-10-CM | POA: Insufficient documentation

## 2020-09-22 DIAGNOSIS — M5412 Radiculopathy, cervical region: Secondary | ICD-10-CM | POA: Insufficient documentation

## 2020-09-22 DIAGNOSIS — M25562 Pain in left knee: Secondary | ICD-10-CM | POA: Insufficient documentation

## 2020-09-22 DIAGNOSIS — M5416 Radiculopathy, lumbar region: Secondary | ICD-10-CM | POA: Insufficient documentation

## 2020-09-22 DIAGNOSIS — G894 Chronic pain syndrome: Secondary | ICD-10-CM | POA: Diagnosis not present

## 2020-09-22 DIAGNOSIS — M25561 Pain in right knee: Secondary | ICD-10-CM | POA: Diagnosis not present

## 2020-09-22 DIAGNOSIS — G8929 Other chronic pain: Secondary | ICD-10-CM | POA: Diagnosis not present

## 2020-09-22 DIAGNOSIS — M961 Postlaminectomy syndrome, not elsewhere classified: Secondary | ICD-10-CM | POA: Diagnosis not present

## 2020-09-22 DIAGNOSIS — M546 Pain in thoracic spine: Secondary | ICD-10-CM | POA: Diagnosis not present

## 2020-09-22 DIAGNOSIS — M542 Cervicalgia: Secondary | ICD-10-CM | POA: Diagnosis not present

## 2020-09-22 NOTE — Progress Notes (Signed)
Office Visit Note   Patient: [name] [name]           Date of Birth: April 02, 1951           MRN: [id number] Visit Date: 09/22/2020              Requested by: Neale Burly, MD [place],  [place] [id number] PCP: Neale Burly, MD  No chief complaint on file.     HPI: Patient is a pleasant 70 year old woman who follows up today with her bilateral knee pain.  Her right knee is all but resolved.  Her left knee we did aspirate a small amount of fluid from her prepatellar bursa she said it hurt quite a bit after that but feels overall is gotten much much better.  She thinks it is more at her baseline  Assessment & Plan: Visit Diagnoses: No diagnosis found.  Plan: May follow-up as needed  Follow-Up Instructions: No follow-ups on file.   Ortho Exam  Patient is alert, oriented, no adenopathy, well-dressed, normal affect, normal respiratory effort. Left knee no effusion no redness no swelling she does have some crepitation with range of motion.  No particular tenderness over the joint line  Imaging: No results found. No images are attached to the encounter.  Labs: Lab Results  Component Value Date   HGBA1C 5.8 (H) 04/19/2017   REPTSTATUS 08/11/2012 FINAL 08/10/2012   GRAMSTAIN  06/10/2010    ABUNDANT WBC PRESENT,BOTH PMN AND MONONUCLEAR NO ORGANISMS SEEN Performed at [facility]  06/10/2010    ABUNDANT WBC PRESENT,BOTH PMN AND MONONUCLEAR NO ORGANISMS SEEN Performed at [facility]  06/10/2010    ABUNDANT WBC PRESENT,BOTH PMN AND MONONUCLEAR NO ORGANISMS SEEN   CULT NO GROWTH 08/10/2012   LABORGA SERRATIA MARCESCENS 06/10/2010     Lab Results  Component Value Date   ALBUMIN 3.9 01/21/2007    No results found for: MG No results found for: VD25OH  No results found for: PREALBUMIN CBC EXTENDED Latest Ref Rng & Units 08/30/2017 05/17/2017 04/19/2017  WBC 4.0 - 10.5 K/uL 9.9 9.7 11.3(H)  RBC 3.87 - 5.11 Mil/uL  3.89 3.55(L) 3.87  HGB 12.0 - 15.0 g/dL 11.9(L) 11.6(L) 12.8  HCT 36.0 - 46.0 % 36.5 35.2(L) 38.4  PLT 150.0 - 400.0 K/uL 289.0 274 302  NEUTROABS 1.4 - 7.7 K/uL 6.3 - -  LYMPHSABS 0.7 - 4.0 K/uL 2.8 - -     There is no height or weight on file to calculate BMI.  Orders:  No orders of the defined types were placed in this encounter.  No orders of the defined types were placed in this encounter.    Procedures: No procedures performed  Clinical Data: No additional findings.  ROS:  All other systems negative, except as noted in the HPI. Review of Systems  Objective: Vital Signs: There were no vitals taken for this visit.  Specialty Comments:  No specialty comments available.  PMFS History: Patient Active Problem List   Diagnosis Date Noted  . Dyslipidemia 04/11/2020  . 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 Upper respiratory tract infection 06/17/2018  . 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 Aortic atherosclerosis ([place]) 04/25/2018  . Cervical stenosis of spinal canal 05/17/2017  . 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 At risk from passive smoking 04/18/2016  . Moderate persistent asthma with acute exacerbation 04/18/2016  . 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 Chronic cough  11/06/2012  . Lung nodule 09/24/2012  . Mild persistent asthma, uncomplicated 26/37/8588  . Lumbar post-laminectomy syndrome 03/22/2012  . Depression with anxiety 09/29/2011  . Cervical post-laminectomy syndrome 08/28/2011  . Arachnoiditis 08/28/2011  . Cervical radicular pain 08/28/2011  . Anxiety 08/28/2011  . Headache   . Dyspnea 12/23/2010  . Hypertension    Past Medical History:  Diagnosis Date  . Acute sinusitis, unspecified   . Allergy   . Anemia    "quite a few times"  . Anxiety state, unspecified   . Arachnoiditis BILATERAL LEGS   DUE TO MULTIPLE BACK SURG.'S  . Arthritis   . Asthma    last flare up was 03/2017 lasted over a month  .  Blood transfusion   . Cancer ([place])    skin cancers (in scalp)  . Cardiomyopathy HX --06/2010   EF was 25% during acute illness (PHELONEPHRITIS) Repeat echo 12-06-10 60% showed normal EF.   Marland Kitchen Chronic back pain greater than 3 months duration    S/P BACK SURG'S  . CSF leak   . Diabetes mellitus without complication ([place])    dx 2013 type 2  . Dyslipidemia   . Dysphagia    some post op cerv fusion 2/14  . Dysrhythmia   . Essential hypertension, benign   . Family history of adverse reaction to anesthesia    mother gets n/v  . GERD (gastroesophageal reflux disease) AND HIATIAL HERNIA   CONTROLLED W/ NEXIUM  . Headache(784.0)   . History of chronic bronchitis   . Hx of bladder infections   . Hyperlipidemia    TAKE CHLOSTEROL MEDICATION,04/23/19  . Hypertension   . Neuromuscular disorder (HCC)    numbness and tingling  . Osteoporosis   . Other malaise and fatigue   . PONV (postoperative nausea and vomiting)   . Shortness of breath   . Spinal headache   . Spinal stenosis, cervical region   . Varicosities    venous  . Weakness of both legs DUE TO ARACHNOIDITIS   OCCASIONAL USES CANE    Family History  Problem Relation Age of Onset  . Hypertension Mother   . Heart disease Mother        Mitral valve replacement.   . Diabetes Mother   . Cancer Father   . Diabetes Brother   . Colon cancer Neg Hx   . Colon polyps Neg Hx   . Esophageal cancer Neg Hx   . Rectal cancer Neg Hx   . Stomach cancer Neg Hx     Past Surgical History:  Procedure Laterality Date  . ABDOMINAL HYSTERECTOMY  1987   W/ BSO  . ANTERIOR CERVICAL DECOMP/DISCECTOMY FUSION N/A 08/09/2012   Procedure: ANTERIOR CERVICAL DECOMPRESSION/DISCECTOMY FUSION 1 LEVEL;  Surgeon: Floyce Stakes, MD;  [facility]: [facility];  Service: Neurosurgery;  Laterality: N/A;  Cervical four-five  Anterior cervical decompression/diskectomy/fusion  . ANTERIOR FUSION CERVICAL SPINE  2007   C5 -6  . APPENDECTOMY    . APPLICATION OF  INTRAOPERATIVE CT SCAN N/A 05/17/2017   Procedure: APPLICATION OF INTRAOPERATIVE CT SCAN;  Surgeon: Erline Levine, MD;  Location: [facility];  Service: Neurosurgery;  Laterality: N/A;  . BACK SURGERY     x 5  . BREAST EXCISIONAL BIOPSY Bilateral    No scar seen   . BREAST SURGERY     x 2 biopsies  . CARPAL TUNNEL RELEASE  RIGHT - 2001  & DEC 2011 W/ BACK SURG.  . CERVICAL DISC SURGERY  2005   C5 - 6  . CHOLECYSTECTOMY  1994  . DORSAL COMPARTMENT RELEASE Right 04/07/2013   Procedure: RIGHT WRIST STS RELEASE;  Surgeon: Schuyler Amor, MD;  Location: [facility];  Service: Orthopedics;  Laterality: Right;  . ENDOVENOUS ABLATION SAPHENOUS VEIN W/ LASER Right 05/29/2019   endovenous laser ablation right greater saphenous vein by Gae Gallop MD   . [place] CSF LEAK  [place] 2011  X2   POST [place]  . FINGER ARTHRODESIS Right 04/07/2013   Procedure: RIGHT INDEX AND RIGHT LONG DISTAL INTERPHALANGEAL JOINT FUSIONS;  Surgeon: Schuyler Amor, MD;  Location: [facility];  Service: Orthopedics;  Laterality: Right;  . FINGER ARTHROPLASTY Right 04/07/2013   Procedure: RIGHT THUMB [place] ARTHROPLASTY;  Surgeon: Schuyler Amor, MD;  Location: [place];  Service: Orthopedics;  Laterality: Right;  . GANGLION CYST EXCISION Right 04/07/2013   Procedure: RIGHT WRIST MASS EXCISION;  Surgeon: Schuyler Amor, MD;  Location: [facility];  Service: Orthopedics;  Laterality: Right;  . KNEE ARTHROSCOPY  LEFT X3 (LAST ONE 2005)  . KNEE ARTHROSCOPY  05/17/2011   Procedure: ARTHROSCOPY KNEE;  Surgeon: Bradley Ferris III;  Location: [facility];  Service: Orthopedics;  Laterality: Right;  WITH MEDIAL MENISECTOMY AND removal of suprapatella fat lump  . LEFT WRIST TENOSYNECTOMY W/ LEFT THUMB JOINT REPAIR  02-24-10  . LUMBAR FUSION  11-30-10   L4 - 5  . LUMBAR FUSION  2000   L2 - 4  . LUMBAR LAMINECTOMY  DEC 2011    L4 - 5  . POSTERIOR CERVICAL FUSION/FORAMINOTOMY N/A 05/17/2017   Procedure: Cervical five  to Thorastic one  Posterior cervical fusion with lateral mass screws/revision of prior instrumentation;  Surgeon: Erline Levine, MD;  Location: [facility];  Service: Neurosurgery;  Laterality: N/A;  C5 to T1 Posterior cervical fusion with lateral mass screws/revision of prior instrumentation  . TENDON REPAIR  JAN 2010   LEFT INDEX AND LONG FINGERS  . THUMB SURGERY   04/07/2019   RIGHT THUMB  . TUBAL LIGATION     Social History   Occupational History  . Occupation: disable    Employer: DISABLED  Tobacco Use  . Smoking status: Never Smoker  . Smokeless tobacco: Never Used  Vaping Use  . Vaping Use: Never used  Substance and Sexual Activity  . Alcohol use: No  . Drug use: No  . Sexual activity: Not on file    Comment: second hand smoke

## 2020-09-22 NOTE — Progress Notes (Signed)
Subjective:    Patient ID: [name] [name], female    DOB: 1950/09/08, 70 y.o.   MRN: [id number]  HPI: [name] [name] is a 70 y.o. female who returns for follow up appointment for chronic pain and medication refill. She states her pain is located in her neck  radiating into her bilateral shoulders, mid- lower back pain radiating into her bilateral hips and bilateral lower extremities. She  rates her pain 8. Her  current exercise regime is walking and performing stretching exercises.  Ms. Nhem Morphine equivalent is 300.00 MME.  UDS ordered today.   Pain Inventory Average Pain 7 Pain Right Now 8 My pain is constant, sharp, burning, stabbing, tingling and aching  In the last 24 hours, has pain interfered with the following? General activity 7 Relation with others 7 Enjoyment of life 7 What TIME of day is your pain at its worst? morning , daytime, evening and night Sleep (in general) Fair  Pain is worse with: walking, bending, sitting, inactivity, standing and some activites Pain improves with: rest, heat/ice, therapy/exercise, medication and some walking Relief from Meds: 7      Family History  Problem Relation Age of Onset  . Hypertension Mother   . Heart disease Mother        Mitral valve replacement.   . Diabetes Mother   . Cancer Father   . Diabetes Brother   . Colon cancer Neg Hx   . Colon polyps Neg Hx   . Esophageal cancer Neg Hx   . Rectal cancer Neg Hx   . Stomach cancer Neg Hx    Social History   Socioeconomic History  . Marital status: Divorced    Spouse name: Not on file  . Number of children: Not on file  . Years of education: Not on file  . Highest education level: Not on file  Occupational History  . Occupation: disable    Employer: DISABLED  Tobacco Use  . Smoking status: Never Smoker  . Smokeless tobacco: Never Used  Vaping Use  . Vaping Use: Never used  Substance and Sexual Activity  . Alcohol use: No  . Drug use: No  . Sexual activity:  Not on file    Comment: second hand smoke  Other Topics Concern  . Not on file  Social History Narrative   Has one child lives at home with her.    Social Determinants of Health   Financial Resource Strain: Not on file  Food Insecurity: Not on file  Transportation Needs: Not on file  Physical Activity: Not on file  Stress: Not on file  Social Connections: Not on file   Past Surgical History:  Procedure Laterality Date  . ABDOMINAL HYSTERECTOMY  1987   W/ BSO  . ANTERIOR CERVICAL DECOMP/DISCECTOMY FUSION N/A 08/09/2012   Procedure: ANTERIOR CERVICAL DECOMPRESSION/DISCECTOMY FUSION 1 LEVEL;  Surgeon: Floyce Stakes, MD;  [facility];  Service: Neurosurgery;  Laterality: N/A;  Cervical four-five  Anterior cervical decompression/diskectomy/fusion  . ANTERIOR FUSION CERVICAL SPINE  2007   C5 -6  . APPENDECTOMY    . APPLICATION OF INTRAOPERATIVE CT SCAN N/A 05/17/2017   Procedure: APPLICATION OF INTRAOPERATIVE CT SCAN;  Surgeon: Erline Levine, MD;  Location: [place];  Service: Neurosurgery;  Laterality: N/A;  . BACK SURGERY     x 5  . BREAST EXCISIONAL BIOPSY Bilateral    No scar seen   . BREAST SURGERY     x 2 biopsies  . CARPAL  TUNNEL RELEASE  RIGHT - 2001  & DEC 2011 W/ BACK SURG.  . CERVICAL DISC SURGERY  2005   C5 - 6  . CHOLECYSTECTOMY  1994  . DORSAL COMPARTMENT RELEASE Right 04/07/2013   Procedure: RIGHT WRIST STS RELEASE;  Surgeon: Schuyler Amor, MD;  Location: [place];  Service: Orthopedics;  Laterality: Right;  . ENDOVENOUS ABLATION SAPHENOUS VEIN W/ LASER Right 05/29/2019   endovenous laser ablation right greater saphenous vein by Gae Gallop MD   . [place] CSF LEAK  [place] 2011  X2   POST [place]  . FINGER ARTHRODESIS Right 04/07/2013   Procedure: RIGHT INDEX AND RIGHT LONG DISTAL INTERPHALANGEAL JOINT FUSIONS;  Surgeon: Schuyler Amor, MD;  Location: [place];  Service: Orthopedics;   Laterality: Right;  . FINGER ARTHROPLASTY Right 04/07/2013   Procedure: RIGHT THUMB [place] ARTHROPLASTY;  Surgeon: Schuyler Amor, MD;  Location: [place];  Service: Orthopedics;  Laterality: Right;  . GANGLION CYST EXCISION Right 04/07/2013   Procedure: RIGHT WRIST MASS EXCISION;  Surgeon: Schuyler Amor, MD;  Location: [place];  Service: Orthopedics;  Laterality: Right;  . KNEE ARTHROSCOPY  LEFT X3 (LAST ONE 2005)  . KNEE ARTHROSCOPY  05/17/2011   Procedure: ARTHROSCOPY KNEE;  Surgeon: Bradley Ferris III;  Location: [place];  Service: Orthopedics;  Laterality: Right;  WITH MEDIAL MENISECTOMY AND removal of suprapatella fat lump  . LEFT WRIST TENOSYNECTOMY W/ LEFT THUMB JOINT REPAIR  02-24-10  . LUMBAR FUSION  11-30-10   L4 - 5  . LUMBAR FUSION  2000   L2 - 4  . LUMBAR LAMINECTOMY  DEC 2011   L4 - 5  . POSTERIOR CERVICAL FUSION/FORAMINOTOMY N/A 05/17/2017   Procedure: Cervical five  to Thorastic one  Posterior cervical fusion with lateral mass screws/revision of prior instrumentation;  Surgeon: Erline Levine, MD;  Location: [place];  Service: Neurosurgery;  Laterality: N/A;  C5 to T1 Posterior cervical fusion with lateral mass screws/revision of prior instrumentation  . TENDON REPAIR  JAN 2010   LEFT INDEX AND LONG FINGERS  . THUMB SURGERY   04/07/2019   RIGHT THUMB  . TUBAL LIGATION     Past Medical History:  Diagnosis Date  . Acute sinusitis, unspecified   . Allergy   . Anemia    "quite a few times"  . Anxiety state, unspecified   . Arachnoiditis BILATERAL LEGS   DUE TO MULTIPLE BACK SURG.'S  . Arthritis   . Asthma    last flare up was 03/2017 lasted over a month  . Blood transfusion   . Cancer ([place])    skin cancers (in scalp)  . Cardiomyopathy HX --06/2010   EF was 25% during acute illness (PHELONEPHRITIS) Repeat echo 12-06-10 60% showed normal EF.   Marland Kitchen Chronic back pain greater than 3 months duration    S/P BACK  SURG'S  . CSF leak   . Diabetes mellitus without complication ([place])    dx 2013 type 2  . Dyslipidemia   . Dysphagia    some post op cerv fusion 2/14  . Dysrhythmia   . Essential hypertension, benign   . Family history of adverse reaction to anesthesia    mother gets n/v  . GERD (gastroesophageal reflux disease) AND HIATIAL HERNIA   CONTROLLED W/ NEXIUM  . Headache(784.0)   . History of chronic bronchitis   . Hx of bladder infections   . Hyperlipidemia  TAKE CHLOSTEROL MEDICATION,04/23/19  . Hypertension   . Neuromuscular disorder (HCC)    numbness and tingling  . Osteoporosis   . Other malaise and fatigue   . PONV (postoperative nausea and vomiting)   . Shortness of breath   . Spinal headache   . Spinal stenosis, cervical region   . Varicosities    venous  . Weakness of both legs DUE TO ARACHNOIDITIS   OCCASIONAL USES CANE   There were no vitals taken for this visit.  Opioid Risk Score:   Fall Risk Score:  `1  Depression screen PHQ 2/9  Depression screen [facility] 2/9 09/22/2020 07/28/2020 05/31/2020 03/03/2020 01/02/2020 10/13/2019 06/25/2019  Decreased Interest 0 0 0 0 0 0 0  Down, Depressed, Hopeless 0 0 0 0 0 0 0  PHQ - 2 Score 0 0 0 0 0 0 0  Altered sleeping - - - - - - -  Tired, decreased energy - - - - - - -  Change in appetite - - - - - - -  Feeling bad or failure about yourself  - - - - - - -  Trouble concentrating - - - - - - -  Moving slowly or fidgety/restless - - - - - - -  Suicidal thoughts - - - - - - -  PHQ-9 Score - - - - - - -  Some recent data might be hidden   Review of Systems  Musculoskeletal: Positive for back pain and neck pain.       Leg pain in both legs  All other systems reviewed and are negative.      Objective:   Physical Exam Vitals and nursing note reviewed.  Constitutional:      Appearance: Normal appearance.  Cardiovascular:     Rate and Rhythm: Normal rate and regular rhythm.     Pulses: Normal pulses.     Heart sounds: Normal  heart sounds.  Pulmonary:     Effort: Pulmonary effort is normal.     Breath sounds: Normal breath sounds.  Musculoskeletal:     Cervical back: Normal range of motion and neck supple.     Comments: Normal Muscle Bulk and Muscle Testing Reveals:  Upper Extremities: Full ROM and Muscle Strength 5/5 Left AC Joint Tenderness  Thoracic Paraspinal Tenderness: T-7-T-9 Lumbar Paraspinal Tenderness: L-3-L-5 Lower Extremities: Full ROM and Muscle Strength 5/5 Arises from Table with ease Narrow Based  Gait   Skin:    General: Skin is warm and dry.  Neurological:     Mental Status: She is alert and oriented to person, place, and time.  Psychiatric:        Mood and Affect: Mood normal.        Behavior: Behavior normal.           Assessment & Plan:  1. On 05/17/2017 :C5C6C7 T1 Posterior Cervical Fusion with lateral mass fixation with AIRO Imaging, revision of prior instrumentation. By Dr. Vertell Limber.  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 Continuecurrent medication regimen withGabapentin800 mgBID.TF:TDDUKGURK 15mg  one tablet 5 times a day  as needed for pain #150tabletsand Xtampza 36 mg every 12 hours #60.09/22/2020 We will continue the opioid monitoring program, this consists of regular clinic visits, examinations, urine drug screen, pill counts as well as use of [place] Controlled Substance Reporting system. 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 Anxiety/depression: PCP Following. Continueto monitor.09/22/2020 4. Muscle Spasms: Continuecurrent medication regime withFlexeril. Continue to Monitor.09/22/2020 5. Cervicalgia/ Cervical Radiculitis: Dr SternFollowing.,Continuecurrent medication regime  withGabapentin: S/P C5C6C7 T1 Posterior Cervical Fusion with lateral mass fixation with AIRO Imaging, revision of prior instrumentation by Dr. Vertell Limber on 05/17/2017.09/22/2020 7. Bilateral Thoracic Back Pain:Continue HEP as Tolerated and Continue current medication regimen. Continue to Monitor.09/22/2020 8. Left lower extremity DVT/ Phlebitis: S/P endovenous laser ablation of left great saphenous vein on 05/09/2018 by Dr. Scot Dock: Vascular Following.09/22/2020. 9. Insomnia:ContinuePamelor. Continue to Monitor.09/22/2020. 10. Left Knee Pain: Ortho Following.   F/U in 1 month

## 2020-09-23 ENCOUNTER — Ambulatory Visit: Payer: Medicare Other | Admitting: Registered Nurse

## 2020-09-26 ENCOUNTER — Other Ambulatory Visit: Payer: Self-pay | Admitting: Registered Nurse

## 2020-09-26 DIAGNOSIS — G039 Meningitis, unspecified: Secondary | ICD-10-CM

## 2020-09-26 DIAGNOSIS — M961 Postlaminectomy syndrome, not elsewhere classified: Secondary | ICD-10-CM

## 2020-09-28 LAB — TOXASSURE SELECT,+ANTIDEPR,UR

## 2020-09-29 ENCOUNTER — Telehealth: Payer: Self-pay | Admitting: *Deleted

## 2020-09-29 NOTE — Telephone Encounter (Signed)
Urine drug screen for this encounter is consistent for prescribed medication 

## 2020-10-13 ENCOUNTER — Ambulatory Visit: Payer: Medicare Other | Admitting: Internal Medicine

## 2020-10-21 ENCOUNTER — Encounter: Payer: Self-pay | Admitting: Registered Nurse

## 2020-10-21 ENCOUNTER — Other Ambulatory Visit: Payer: Self-pay

## 2020-10-21 ENCOUNTER — Encounter: Payer: Medicare Other | Attending: Physical Medicine & Rehabilitation | Admitting: Registered Nurse

## 2020-10-21 VITALS — BP 97/67 | HR 89 | Temp 98.1°F | Ht 64.0 in | Wt 164.2 lb

## 2020-10-21 DIAGNOSIS — Z79891 Long term (current) use of opiate analgesic: Secondary | ICD-10-CM | POA: Insufficient documentation

## 2020-10-21 DIAGNOSIS — G039 Meningitis, unspecified: Secondary | ICD-10-CM | POA: Diagnosis not present

## 2020-10-21 DIAGNOSIS — M5416 Radiculopathy, lumbar region: Secondary | ICD-10-CM | POA: Diagnosis not present

## 2020-10-21 DIAGNOSIS — Z5181 Encounter for therapeutic drug level monitoring: Secondary | ICD-10-CM | POA: Diagnosis not present

## 2020-10-21 DIAGNOSIS — G8929 Other chronic pain: Secondary | ICD-10-CM | POA: Insufficient documentation

## 2020-10-21 DIAGNOSIS — M961 Postlaminectomy syndrome, not elsewhere classified: Secondary | ICD-10-CM | POA: Diagnosis not present

## 2020-10-21 DIAGNOSIS — M542 Cervicalgia: Secondary | ICD-10-CM | POA: Diagnosis not present

## 2020-10-21 DIAGNOSIS — G894 Chronic pain syndrome: Secondary | ICD-10-CM | POA: Diagnosis not present

## 2020-10-21 DIAGNOSIS — M5412 Radiculopathy, cervical region: Secondary | ICD-10-CM | POA: Insufficient documentation

## 2020-10-21 DIAGNOSIS — M546 Pain in thoracic spine: Secondary | ICD-10-CM | POA: Diagnosis not present

## 2020-10-21 MED ORDER — NORTRIPTYLINE HCL 25 MG PO CAPS: 25.0000 mg | ORAL_CAPSULE | Freq: Every day | ORAL | 2 refills | Status: DC

## 2020-10-21 MED ORDER — XTAMPZA ER 36 MG PO C12A: 1.0000 | EXTENDED_RELEASE_CAPSULE | Freq: Two times a day (BID) | ORAL | 0 refills | Status: DC

## 2020-10-21 MED ORDER — OXYCODONE HCL 15 MG PO TABS: 15.0000 mg | ORAL_TABLET | Freq: Every day | ORAL | 0 refills | Status: DC | PRN

## 2020-10-21 MED ORDER — TOPIRAMATE 25 MG PO TABS: 25.0000 mg | ORAL_TABLET | Freq: Two times a day (BID) | ORAL | 2 refills | Status: DC

## 2020-10-21 MED ORDER — GABAPENTIN 800 MG PO TABS: 800.0000 mg | ORAL_TABLET | Freq: Three times a day (TID) | ORAL | 3 refills | Status: DC

## 2020-10-21 NOTE — Progress Notes (Signed)
Subjective:    Patient ID: [name] [name], female    DOB: [birth year], 70 y.o.   MRN: [id number]  HPI: [name] [name] is a 70 y.o. female who returns for follow up appointment for chronic pain and medication refill. She states her  pain is located in her neck, mid- lower back pain radiating into her bilateral hips. She rates her pain 8. Her  current exercise regime is walking and performing stretching exercises.  Ms. [place] Morphine equivalent is 230.75 MME.  Last UDS was Performed on 09/22/2020, it was consistent.    Pain Inventory Average Pain 7 Pain Right Now 8 My pain is sharp, burning, stabbing and tingling  In the last 24 hours, has pain interfered with the following? General activity 7 Relation with others 7 Enjoyment of life 7 What TIME of day is your pain at its worst? morning , daytime, evening and night Sleep (in general) Fair  Pain is worse with: walking, bending, sitting, inactivity, standing and some activites Pain improves with: rest, heat/ice, therapy/exercise, pacing activities and medication Relief from Meds: 7  Family History  Problem Relation Age of Onset  . Hypertension Mother   . Heart disease Mother        Mitral valve replacement.   . Diabetes Mother   . Cancer Father   . Diabetes Brother   . Colon cancer Neg Hx   . Colon polyps Neg Hx   . Esophageal cancer Neg Hx   . Rectal cancer Neg Hx   . Stomach cancer Neg Hx    Social History   Socioeconomic History  . Marital status: Divorced    Spouse name: Not on file  . Number of children: Not on file  . Years of education: Not on file  . Highest education level: Not on file  Occupational History  . Occupation: disable    Employer: DISABLED  Tobacco Use  . Smoking status: Never Smoker  . Smokeless tobacco: Never Used  Vaping Use  . Vaping Use: Never used  Substance and Sexual Activity  . Alcohol use: No  . Drug use: No  . Sexual activity: Not on file    Comment: second hand smoke  Other  Topics Concern  . Not on file  Social History Narrative   Has one child lives at home with her.    Social Determinants of Health   Financial Resource Strain: Not on file  Food Insecurity: Not on file  Transportation Needs: Not on file  Physical Activity: Not on file  Stress: Not on file  Social Connections: Not on file   Past Surgical History:  Procedure Laterality Date  . ABDOMINAL HYSTERECTOMY  1987   W/ BSO  . ANTERIOR CERVICAL DECOMP/DISCECTOMY FUSION N/A 08/09/2012   Procedure: ANTERIOR CERVICAL DECOMPRESSION/DISCECTOMY FUSION 1 LEVEL;  Surgeon: Floyce Stakes, MD;  [facility];  Service: Neurosurgery;  Laterality: N/A;  Cervical four-five  Anterior cervical decompression/diskectomy/fusion  . ANTERIOR FUSION CERVICAL SPINE  2007   C5 -6  . APPENDECTOMY    . APPLICATION OF INTRAOPERATIVE CT SCAN N/A 05/17/2017   Procedure: APPLICATION OF INTRAOPERATIVE CT SCAN;  Surgeon: Erline Levine, MD;  Location: [place];  Service: Neurosurgery;  Laterality: N/A;  . BACK SURGERY     x 5  . BREAST EXCISIONAL BIOPSY Bilateral    No scar seen   . BREAST SURGERY     x 2 biopsies  . CARPAL TUNNEL RELEASE  RIGHT - 2001  & DEC  2011 W/ BACK SURG.  . CERVICAL DISC SURGERY  2005   C5 - 6  . CHOLECYSTECTOMY  1994  . DORSAL COMPARTMENT RELEASE Right 04/07/2013   Procedure: RIGHT WRIST STS RELEASE;  Surgeon: Schuyler Amor, MD;  Location: [place];  Service: Orthopedics;  Laterality: Right;  . ENDOVENOUS ABLATION SAPHENOUS VEIN W/ LASER Right 05/29/2019   endovenous laser ablation right greater saphenous vein by Gae Gallop MD   . [place] CSF LEAK  [place] 2011  X2   POST [place]  . FINGER ARTHRODESIS Right 04/07/2013   Procedure: RIGHT INDEX AND RIGHT LONG DISTAL INTERPHALANGEAL JOINT FUSIONS;  Surgeon: Schuyler Amor, MD;  Location: [place];  Service: Orthopedics;  Laterality: Right;  . FINGER ARTHROPLASTY Right  04/07/2013   Procedure: RIGHT THUMB [place] ARTHROPLASTY;  Surgeon: Schuyler Amor, MD;  Location: [place];  Service: Orthopedics;  Laterality: Right;  . GANGLION CYST EXCISION Right 04/07/2013   Procedure: RIGHT WRIST MASS EXCISION;  Surgeon: Schuyler Amor, MD;  Location: [place];  Service: Orthopedics;  Laterality: Right;  . KNEE ARTHROSCOPY  LEFT X3 (LAST ONE 2005)  . KNEE ARTHROSCOPY  05/17/2011   Procedure: ARTHROSCOPY KNEE;  Surgeon: Bradley Ferris III;  Location: [place];  Service: Orthopedics;  Laterality: Right;  WITH MEDIAL MENISECTOMY AND removal of suprapatella fat lump  . LEFT WRIST TENOSYNECTOMY W/ LEFT THUMB JOINT REPAIR  02-24-10  . LUMBAR FUSION  11-30-10   L4 - 5  . LUMBAR FUSION  2000   L2 - 4  . LUMBAR LAMINECTOMY  DEC 2011   L4 - 5  . POSTERIOR CERVICAL FUSION/FORAMINOTOMY N/A 05/17/2017   Procedure: Cervical five  to Thorastic one  Posterior cervical fusion with lateral mass screws/revision of prior instrumentation;  Surgeon: Erline Levine, MD;  Location: [place];  Service: Neurosurgery;  Laterality: N/A;  C5 to T1 Posterior cervical fusion with lateral mass screws/revision of prior instrumentation  . TENDON REPAIR  JAN 2010   LEFT INDEX AND LONG FINGERS  . THUMB SURGERY   04/07/2019   RIGHT THUMB  . TUBAL LIGATION     Past Surgical History:  Procedure Laterality Date  . ABDOMINAL HYSTERECTOMY  1987   W/ BSO  . ANTERIOR CERVICAL DECOMP/DISCECTOMY FUSION N/A 08/09/2012   Procedure: ANTERIOR CERVICAL DECOMPRESSION/DISCECTOMY FUSION 1 LEVEL;  Surgeon: Floyce Stakes, MD;  [facility];  Service: Neurosurgery;  Laterality: N/A;  Cervical four-five  Anterior cervical decompression/diskectomy/fusion  . ANTERIOR FUSION CERVICAL SPINE  2007   C5 -6  . APPENDECTOMY    . APPLICATION OF INTRAOPERATIVE CT SCAN N/A 05/17/2017   Procedure: APPLICATION OF INTRAOPERATIVE CT SCAN;  Surgeon: Erline Levine, MD;   Location: [place];  Service: Neurosurgery;  Laterality: N/A;  . BACK SURGERY     x 5  . BREAST EXCISIONAL BIOPSY Bilateral    No scar seen   . BREAST SURGERY     x 2 biopsies  . CARPAL TUNNEL RELEASE  RIGHT - 2001  & DEC 2011 W/ BACK SURG.  . CERVICAL DISC SURGERY  2005   C5 - 6  . CHOLECYSTECTOMY  1994  . DORSAL COMPARTMENT RELEASE Right 04/07/2013   Procedure: RIGHT WRIST STS RELEASE;  Surgeon: Schuyler Amor, MD;  Location: [place];  Service: Orthopedics;  Laterality: Right;  . ENDOVENOUS ABLATION SAPHENOUS VEIN W/ LASER Right 05/29/2019   endovenous laser ablation  right greater saphenous vein by Gae Gallop MD   . EXPLORATION OF INCISION FOR CSF LEAK  [place] 2011  X2   POST [place]  . FINGER ARTHRODESIS Right 04/07/2013   Procedure: RIGHT INDEX AND RIGHT LONG DISTAL INTERPHALANGEAL JOINT FUSIONS;  Surgeon: Schuyler Amor, MD;  Location: [place];  Service: Orthopedics;  Laterality: Right;  . FINGER ARTHROPLASTY Right 04/07/2013   Procedure: RIGHT THUMB [place] ARTHROPLASTY;  Surgeon: Schuyler Amor, MD;  Location: [place];  Service: Orthopedics;  Laterality: Right;  . GANGLION CYST EXCISION Right 04/07/2013   Procedure: RIGHT WRIST MASS EXCISION;  Surgeon: Schuyler Amor, MD;  Location: [place];  Service: Orthopedics;  Laterality: Right;  . KNEE ARTHROSCOPY  LEFT X3 (LAST ONE 2005)  . KNEE ARTHROSCOPY  05/17/2011   Procedure: ARTHROSCOPY KNEE;  Surgeon: Bradley Ferris III;  Location: [place];  Service: Orthopedics;  Laterality: Right;  WITH MEDIAL MENISECTOMY AND removal of suprapatella fat lump  . LEFT WRIST TENOSYNECTOMY W/ LEFT THUMB JOINT REPAIR  02-24-10  . LUMBAR FUSION  11-30-10   L4 - 5  . LUMBAR FUSION  2000   L2 - 4  . LUMBAR LAMINECTOMY  DEC 2011   L4 - 5  . POSTERIOR CERVICAL FUSION/FORAMINOTOMY N/A 05/17/2017   Procedure: Cervical five  to Thorastic one  Posterior  cervical fusion with lateral mass screws/revision of prior instrumentation;  Surgeon: Erline Levine, MD;  Location: [place];  Service: Neurosurgery;  Laterality: N/A;  C5 to T1 Posterior cervical fusion with lateral mass screws/revision of prior instrumentation  . TENDON REPAIR  JAN 2010   LEFT INDEX AND LONG FINGERS  . THUMB SURGERY   04/07/2019   RIGHT THUMB  . TUBAL LIGATION     Past Medical History:  Diagnosis Date  . Acute sinusitis, unspecified   . Allergy   . Anemia    "quite a few times"  . Anxiety state, unspecified   . Arachnoiditis BILATERAL LEGS   DUE TO MULTIPLE BACK SURG.'S  . Arthritis   . Asthma    last flare up was 03/2017 lasted over a month  . Blood transfusion   . Cancer ([place])    skin cancers (in scalp)  . Cardiomyopathy HX --06/2010   EF was 25% during acute illness (PHELONEPHRITIS) Repeat echo 12-06-10 60% showed normal EF.   Marland Kitchen Chronic back pain greater than 3 months duration    S/P BACK SURG'S  . CSF leak   . Diabetes mellitus without complication ([place])    dx 2013 type 2  . Dyslipidemia   . Dysphagia    some post op cerv fusion 2/14  . Dysrhythmia   . Essential hypertension, benign   . Family history of adverse reaction to anesthesia    mother gets n/v  . GERD (gastroesophageal reflux disease) AND HIATIAL HERNIA   CONTROLLED W/ NEXIUM  . Headache(784.0)   . History of chronic bronchitis   . Hx of bladder infections   . Hyperlipidemia    TAKE CHLOSTEROL MEDICATION,04/23/19  . Hypertension   . Neuromuscular disorder (HCC)    numbness and tingling  . Osteoporosis   . Other malaise and fatigue   . PONV (postoperative nausea and vomiting)   . Shortness of breath   . Spinal headache   . Spinal stenosis, cervical region   . Varicosities    venous  . Weakness of both legs DUE TO ARACHNOIDITIS   OCCASIONAL USES  CANE   BP 97/67   Pulse 89   Temp 98.1 F (36.7 C)   Ht 5\' 4"  (1.626 m)   Wt 164 lb 3.2 oz (74.5 kg)   SpO2 96%   BMI 28.18 kg/m    Opioid Risk Score:   Fall Risk Score:  `1  Depression screen PHQ 2/9  Depression screen [facility] 2/9 09/22/2020 09/22/2020 07/28/2020 05/31/2020 03/03/2020 01/02/2020 10/13/2019  Decreased Interest 0 0 0 0 0 0 0  Down, Depressed, Hopeless 0 0 0 0 0 0 0  PHQ - 2 Score 0 0 0 0 0 0 0  Altered sleeping - - - - - - -  Tired, decreased energy - - - - - - -  Change in appetite - - - - - - -  Feeling bad or failure about yourself  - - - - - - -  Trouble concentrating - - - - - - -  Moving slowly or fidgety/restless - - - - - - -  Suicidal thoughts - - - - - - -  PHQ-9 Score - - - - - - -  Some recent data might be hidden      Review of Systems  Constitutional: Negative.   HENT: Negative.   Eyes: Negative.   Respiratory: Negative.   Cardiovascular: Negative.   Gastrointestinal: Negative.   Endocrine: Negative.   Genitourinary: Negative.   Musculoskeletal: Positive for back pain and neck pain.       Right and left leg pain  Skin: Negative.   Allergic/Immunologic: Negative.   Neurological: Negative.   Hematological: Negative.   Psychiatric/Behavioral: Negative.        Objective:   Physical Exam Vitals and nursing note reviewed.  Constitutional:      Appearance: Normal appearance.  Neck:     Comments: Cervical Paraspinal Tenderness: C-5-C-6 Cardiovascular:     Rate and Rhythm: Normal rate and regular rhythm.     Pulses: Normal pulses.     Heart sounds: Normal heart sounds.  Pulmonary:     Effort: Pulmonary effort is normal.     Breath sounds: Normal breath sounds.  Musculoskeletal:     Cervical back: Normal range of motion and neck supple.     Comments: Normal Muscle Bulk and Muscle Testing Reveals:  Upper Extremities: Full ROM and Muscle Strength 5/5  Lumbar Hypersensitivity Bilateral Greater Trochanter Tenderness Lower Extremities: Full ROM and Muscle Strength 5/5 Arises from table with ease Narrow Based  Gait   Skin:    General: Skin is warm and dry.  Neurological:      Mental Status: She is alert and oriented to person, place, and time.  Psychiatric:        Mood and Affect: Mood normal.        Behavior: Behavior normal.           Assessment & Plan:  1. On 05/17/2017 :C5C6C7 T1 Posterior Cervical Fusion with lateral mass fixation with AIRO Imaging, revision of prior instrumentation. By Dr. Vertell Limber.  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 Continuecurrent medication regimen withGabapentin800 mgBID.[place]:Oxycodone 15mg  one tablet 5 times a day  as needed for pain #150tabletsand Xtampza 36 mg every 12 hours #60.10/21/2020 We will continue the opioid monitoring program, this consists of regular clinic visits, examinations, urine drug screen, pill counts as well as use of [place] Controlled Substance Reporting system. A 12 month History has been reviewed on the [place] Controlled Substance Reporting Systemon05/10/2020 2.Lumbar Post-laminectomy:Lumbar arachnoiditis  with chronic lower extremity neuropathic pain.Continue with Gabapentin.Continue to Monitor.10/21/2020 3. Anxiety/depression: PCP Following. Continueto monitor.10/21/2020 4. Muscle Spasms: Continuecurrent medication regime withFlexeril. Continue to Monitor.10/21/2020 5. Cervicalgia/ Cervical Radiculitis: Dr SternFollowing.,Continuecurrent medication regime withGabapentin: S/P C5C6C7 T1 Posterior Cervical Fusion with lateral mass fixation with AIRO Imaging, revision of prior instrumentation by Dr. Vertell Limber on 05/17/2017.10/21/2020 7. Bilateral Thoracic Back Pain:Continue HEP as Tolerated and Continue current medication regimen. Continue to Monitor.10/21/2020 8. Left lower extremity DVT/ Phlebitis: S/P endovenous laser ablation of left great saphenous vein on 05/09/2018 by Dr. Scot Dock: Vascular Following.10/21/2020. 9. Insomnia:ContinuePamelor. Continue to Monitor.10/21/2020. 10. Left Knee Pain: No complaints today.  Ortho Following. 10/21/2020  F/U in 1 month

## 2020-11-10 ENCOUNTER — Ambulatory Visit: Payer: Medicare Other | Admitting: Internal Medicine

## 2020-11-19 ENCOUNTER — Ambulatory Visit: Payer: Medicare Other | Admitting: Registered Nurse

## 2020-11-22 ENCOUNTER — Encounter: Payer: Medicare Other | Attending: Physical Medicine & Rehabilitation | Admitting: Registered Nurse

## 2020-11-22 ENCOUNTER — Encounter: Payer: Self-pay | Admitting: Registered Nurse

## 2020-11-22 VITALS — BP 116/71 | HR 84 | Temp 97.7°F | Ht 64.0 in | Wt 161.4 lb

## 2020-11-22 DIAGNOSIS — M961 Postlaminectomy syndrome, not elsewhere classified: Secondary | ICD-10-CM | POA: Diagnosis not present

## 2020-11-22 DIAGNOSIS — M5416 Radiculopathy, lumbar region: Secondary | ICD-10-CM | POA: Diagnosis not present

## 2020-11-22 DIAGNOSIS — M546 Pain in thoracic spine: Secondary | ICD-10-CM | POA: Diagnosis not present

## 2020-11-22 DIAGNOSIS — M542 Cervicalgia: Secondary | ICD-10-CM | POA: Insufficient documentation

## 2020-11-22 DIAGNOSIS — Z79891 Long term (current) use of opiate analgesic: Secondary | ICD-10-CM | POA: Diagnosis not present

## 2020-11-22 DIAGNOSIS — G039 Meningitis, unspecified: Secondary | ICD-10-CM | POA: Insufficient documentation

## 2020-11-22 DIAGNOSIS — Z5181 Encounter for therapeutic drug level monitoring: Secondary | ICD-10-CM | POA: Diagnosis not present

## 2020-11-22 DIAGNOSIS — G8929 Other chronic pain: Secondary | ICD-10-CM | POA: Insufficient documentation

## 2020-11-22 DIAGNOSIS — M5412 Radiculopathy, cervical region: Secondary | ICD-10-CM | POA: Insufficient documentation

## 2020-11-22 DIAGNOSIS — G894 Chronic pain syndrome: Secondary | ICD-10-CM | POA: Insufficient documentation

## 2020-11-22 MED ORDER — OXYCODONE HCL 15 MG PO TABS: 15.0000 mg | ORAL_TABLET | Freq: Every day | ORAL | 0 refills | Status: DC | PRN

## 2020-11-22 MED ORDER — XTAMPZA ER 36 MG PO C12A: 1.0000 | EXTENDED_RELEASE_CAPSULE | Freq: Two times a day (BID) | ORAL | 0 refills | Status: DC

## 2020-11-22 NOTE — Progress Notes (Signed)
Subjective:    Patient ID: [name] [name], female    DOB: 07-31-1950, 70 y.o.   MRN: [id number]  HPI: [name] [name] is a 70 y.o. female who returns for follow up appointment for chronic pain and medication refill. She states her pain is located in her neck radiating into her bilateral shoulders and mid- lower back pain radiating into her bilateral lower extremities. She rates her pain 6. Her current exercise regime is walking, gardening  and performing stretching exercises.  Arrived to office hypotensive, blood pressure  was re-checked, she was instructed to F/U with her PCP and keep a blood pressure log. She verbalizes understanding.   [name] Morphine equivalent is 217.25 MME.  Last UDS was Performed on 09/22/2020, it was consistent.     Pain Inventory Average Pain 7 Pain Right Now 6 My pain is sharp, burning, stabbing and tingling  In the last 24 hours, has pain interfered with the following? General activity 7 Relation with others 6 Enjoyment of life 6 What TIME of day is your pain at its worst? morning , daytime, evening and night Sleep (in general) Fair  Pain is worse with: walking, bending, sitting, inactivity and standing Pain improves with: rest, heat/ice, therapy/exercise, pacing activities and medication Relief from Meds: 6  Family History  Problem Relation Age of Onset  . Hypertension Mother   . Heart disease Mother        Mitral valve replacement.   . Diabetes Mother   . Cancer Father   . Diabetes Brother   . Colon cancer Neg Hx   . Colon polyps Neg Hx   . Esophageal cancer Neg Hx   . Rectal cancer Neg Hx   . Stomach cancer Neg Hx    Social History   Socioeconomic History  . Marital status: Divorced    Spouse name: Not on file  . Number of children: Not on file  . Years of education: Not on file  . Highest education level: Not on file  Occupational History  . Occupation: disable    Employer: DISABLED  Tobacco Use  . Smoking status: Never Smoker   . Smokeless tobacco: Never Used  Vaping Use  . Vaping Use: Never used  Substance and Sexual Activity  . Alcohol use: No  . Drug use: No  . Sexual activity: Not on file    Comment: second hand smoke  Other Topics Concern  . Not on file  Social History Narrative   Has one child lives at home with her.    Social Determinants of Health   Financial Resource Strain: Not on file  Food Insecurity: Not on file  Transportation Needs: Not on file  Physical Activity: Not on file  Stress: Not on file  Social Connections: Not on file   Past Surgical History:  Procedure Laterality Date  . ABDOMINAL HYSTERECTOMY  1987   W/ BSO  . ANTERIOR CERVICAL DECOMP/DISCECTOMY FUSION N/A 08/09/2012   Procedure: ANTERIOR CERVICAL DECOMPRESSION/DISCECTOMY FUSION 1 LEVEL;  Surgeon: Floyce Stakes, MD;  Location: [facility];  Service: Neurosurgery;  Laterality: N/A;  Cervical four-five  Anterior cervical decompression/diskectomy/fusion  . ANTERIOR FUSION CERVICAL SPINE  2007   C5 -6  . APPENDECTOMY    . APPLICATION OF INTRAOPERATIVE CT SCAN N/A 05/17/2017   Procedure: APPLICATION OF INTRAOPERATIVE CT SCAN;  Surgeon: Erline Levine, MD;  Location: [place];  Service: Neurosurgery;  Laterality: N/A;  . BACK SURGERY     x 5  .  BREAST EXCISIONAL BIOPSY Bilateral    No scar seen   . BREAST SURGERY     x 2 biopsies  . CARPAL TUNNEL RELEASE  RIGHT - 2001  & DEC 2011 W/ BACK SURG.  . CERVICAL DISC SURGERY  2005   C5 - 6  . CHOLECYSTECTOMY  1994  . DORSAL COMPARTMENT RELEASE Right 04/07/2013   Procedure: RIGHT WRIST STS RELEASE;  Surgeon: Schuyler Amor, MD;  Location: [place];  Service: Orthopedics;  Laterality: Right;  . ENDOVENOUS ABLATION SAPHENOUS VEIN W/ LASER Right 05/29/2019   endovenous laser ablation right greater saphenous vein by Gae Gallop MD   . [place] CSF LEAK  [place] 2011  X2   POST [place]  . FINGER ARTHRODESIS Right 04/07/2013   Procedure:  RIGHT INDEX AND RIGHT LONG DISTAL INTERPHALANGEAL JOINT FUSIONS;  Surgeon: Schuyler Amor, MD;  Location: [place];  Service: Orthopedics;  Laterality: Right;  . FINGER ARTHROPLASTY Right 04/07/2013   Procedure: RIGHT THUMB [place] ARTHROPLASTY;  Surgeon: Schuyler Amor, MD;  Location: [place];  Service: Orthopedics;  Laterality: Right;  . GANGLION CYST EXCISION Right 04/07/2013   Procedure: RIGHT WRIST MASS EXCISION;  Surgeon: Schuyler Amor, MD;  Location: [place];  Service: Orthopedics;  Laterality: Right;  . KNEE ARTHROSCOPY  LEFT X3 (LAST ONE 2005)  . KNEE ARTHROSCOPY  05/17/2011   Procedure: ARTHROSCOPY KNEE;  Surgeon: Bradley Ferris III;  Location: [place];  Service: Orthopedics;  Laterality: Right;  WITH MEDIAL MENISECTOMY AND removal of suprapatella fat lump  . LEFT WRIST TENOSYNECTOMY W/ LEFT THUMB JOINT REPAIR  02-24-10  . LUMBAR FUSION  11-30-10   L4 - 5  . LUMBAR FUSION  2000   L2 - 4  . LUMBAR LAMINECTOMY  DEC 2011   L4 - 5  . POSTERIOR CERVICAL FUSION/FORAMINOTOMY N/A 05/17/2017   Procedure: Cervical five  to Thorastic one  Posterior cervical fusion with lateral mass screws/revision of prior instrumentation;  Surgeon: Erline Levine, MD;  Location: [place];  Service: Neurosurgery;  Laterality: N/A;  C5 to T1 Posterior cervical fusion with lateral mass screws/revision of prior instrumentation  . TENDON REPAIR  JAN 2010   LEFT INDEX AND LONG FINGERS  . THUMB SURGERY   04/07/2019   RIGHT THUMB  . TUBAL LIGATION     Past Surgical History:  Procedure Laterality Date  . ABDOMINAL HYSTERECTOMY  1987   W/ BSO  . ANTERIOR CERVICAL DECOMP/DISCECTOMY FUSION N/A 08/09/2012   Procedure: ANTERIOR CERVICAL DECOMPRESSION/DISCECTOMY FUSION 1 LEVEL;  Surgeon: Floyce Stakes, MD;  Location: [facility];  Service: Neurosurgery;  Laterality: N/A;  Cervical four-five  Anterior cervical decompression/diskectomy/fusion   . ANTERIOR FUSION CERVICAL SPINE  2007   C5 -6  . APPENDECTOMY    . APPLICATION OF INTRAOPERATIVE CT SCAN N/A 05/17/2017   Procedure: APPLICATION OF INTRAOPERATIVE CT SCAN;  Surgeon: Erline Levine, MD;  Location: [place];  Service: Neurosurgery;  Laterality: N/A;  . BACK SURGERY     x 5  . BREAST EXCISIONAL BIOPSY Bilateral    No scar seen   . BREAST SURGERY     x 2 biopsies  . CARPAL TUNNEL RELEASE  RIGHT - 2001  & DEC 2011 W/ BACK SURG.  . CERVICAL DISC SURGERY  2005   C5 - 6  . CHOLECYSTECTOMY  1994  . DORSAL COMPARTMENT RELEASE Right 04/07/2013   Procedure: RIGHT WRIST STS  RELEASE;  Surgeon: Schuyler Amor, MD;  Location: [place];  Service: Orthopedics;  Laterality: Right;  . ENDOVENOUS ABLATION SAPHENOUS VEIN W/ LASER Right 05/29/2019   endovenous laser ablation right greater saphenous vein by Gae Gallop MD   . [place] CSF LEAK  [place] 2011  X2   POST [place]  . FINGER ARTHRODESIS Right 04/07/2013   Procedure: RIGHT INDEX AND RIGHT LONG DISTAL INTERPHALANGEAL JOINT FUSIONS;  Surgeon: Schuyler Amor, MD;  Location: [place];  Service: Orthopedics;  Laterality: Right;  . FINGER ARTHROPLASTY Right 04/07/2013   Procedure: RIGHT THUMB [place] ARTHROPLASTY;  Surgeon: Schuyler Amor, MD;  Location: [place];  Service: Orthopedics;  Laterality: Right;  . GANGLION CYST EXCISION Right 04/07/2013   Procedure: RIGHT WRIST MASS EXCISION;  Surgeon: Schuyler Amor, MD;  Location: [place];  Service: Orthopedics;  Laterality: Right;  . KNEE ARTHROSCOPY  LEFT X3 (LAST ONE 2005)  . KNEE ARTHROSCOPY  05/17/2011   Procedure: ARTHROSCOPY KNEE;  Surgeon: Bradley Ferris III;  Location: [place];  Service: Orthopedics;  Laterality: Right;  WITH MEDIAL MENISECTOMY AND removal of suprapatella fat lump  . LEFT WRIST TENOSYNECTOMY W/ LEFT THUMB JOINT REPAIR  02-24-10  . LUMBAR FUSION   11-30-10   L4 - 5  . LUMBAR FUSION  2000   L2 - 4  . LUMBAR LAMINECTOMY  DEC 2011   L4 - 5  . POSTERIOR CERVICAL FUSION/FORAMINOTOMY N/A 05/17/2017   Procedure: Cervical five  to Thorastic one  Posterior cervical fusion with lateral mass screws/revision of prior instrumentation;  Surgeon: Erline Levine, MD;  Location: [place];  Service: Neurosurgery;  Laterality: N/A;  C5 to T1 Posterior cervical fusion with lateral mass screws/revision of prior instrumentation  . TENDON REPAIR  JAN 2010   LEFT INDEX AND LONG FINGERS  . THUMB SURGERY   04/07/2019   RIGHT THUMB  . TUBAL LIGATION     Past Medical History:  Diagnosis Date  . Acute sinusitis, unspecified   . Allergy   . Anemia    "quite a few times"  . Anxiety state, unspecified   . Arachnoiditis BILATERAL LEGS   DUE TO MULTIPLE BACK SURG.'S  . Arthritis   . Asthma    last flare up was 03/2017 lasted over a month  . Blood transfusion   . Cancer ([place])    skin cancers (in scalp)  . Cardiomyopathy HX --06/2010   EF was 25% during acute illness (PHELONEPHRITIS) Repeat echo 12-06-10 60% showed normal EF.   Marland Kitchen Chronic back pain greater than 3 months duration    S/P BACK SURG'S  . CSF leak   . Diabetes mellitus without complication ([place])    dx 2013 type 2  . Dyslipidemia   . Dysphagia    some post op cerv fusion 2/14  . Dysrhythmia   . Essential hypertension, benign   . Family history of adverse reaction to anesthesia    mother gets n/v  . GERD (gastroesophageal reflux disease) AND HIATIAL HERNIA   CONTROLLED W/ NEXIUM  . Headache(784.0)   . History of chronic bronchitis   . Hx of bladder infections   . Hyperlipidemia    TAKE CHLOSTEROL MEDICATION,04/23/19  . Hypertension   . Neuromuscular disorder ([facility])    numbness and tingling  . Osteoporosis   . Other malaise and fatigue   . PONV (postoperative nausea and vomiting)   . Shortness of  breath   . Spinal headache   . Spinal stenosis, cervical region   . Varicosities     venous  . Weakness of both legs DUE TO ARACHNOIDITIS   OCCASIONAL USES CANE   BP (!) 91/59   Pulse 86   Temp 97.7 F (36.5 C)   Ht 5\' 4"  (1.626 m)   Wt 161 lb 6.4 oz (73.2 kg)   SpO2 94%   BMI 27.70 kg/m   Opioid Risk Score:   Fall Risk Score:  `1  Depression screen PHQ 2/9  Depression screen [facility] 2/9 10/21/2020 09/22/2020 09/22/2020 07/28/2020 05/31/2020 03/03/2020 01/02/2020  Decreased Interest 0 0 0 0 0 0 0  Down, Depressed, Hopeless 0 0 0 0 0 0 0  PHQ - 2 Score 0 0 0 0 0 0 0  Altered sleeping - - - - - - -  Tired, decreased energy - - - - - - -  Change in appetite - - - - - - -  Feeling bad or failure about yourself  - - - - - - -  Trouble concentrating - - - - - - -  Moving slowly or fidgety/restless - - - - - - -  Suicidal thoughts - - - - - - -  PHQ-9 Score - - - - - - -  Some recent data might be hidden     Review of Systems  Constitutional: Negative.   HENT: Negative.   Eyes: Negative.   Respiratory: Negative.   Cardiovascular: Negative.   Gastrointestinal: Negative.   Endocrine: Negative.   Genitourinary: Negative.   Musculoskeletal: Positive for back pain, gait problem and neck pain.       RIGHT ARM PAIN  Skin: Negative.   Allergic/Immunologic: Negative.   Hematological: Negative.   Psychiatric/Behavioral: Negative.        Objective:   Physical Exam Vitals and nursing note reviewed.  Constitutional:      Appearance: Normal appearance.  Neck:     Comments: Cervical Paraspinal Tenderness: C-5-C-6 Cardiovascular:     Rate and Rhythm: Normal rate and regular rhythm.     Pulses: Normal pulses.     Heart sounds: Normal heart sounds.  Pulmonary:     Effort: Pulmonary effort is normal.     Breath sounds: Normal breath sounds.  Musculoskeletal:     Cervical back: Normal range of motion and neck supple.     Comments: Normal Muscle Bulk and Muscle Testing Reveals:  Upper Extremities: Full ROM and Muscle Strength 5/5 Bilateral AC Joint Tenderness Thoracic  Paraspinal Tenderness: T-1-T-3  Lumbar Paraspinal Tenderness: L-3-L-5 Lower Extremities: Full ROM and Muscle Strength 5/5 Arises from Table with ease Narrow Based Gait   Skin:    General: Skin is warm and dry.  Neurological:     Mental Status: She is alert and oriented to person, place, and time.  Psychiatric:        Mood and Affect: Mood normal.        Behavior: Behavior normal.           Assessment & Plan:  1. On 05/17/2017 :C5C6C7 T1 Posterior Cervical Fusion with lateral mass fixation with AIRO Imaging, revision of prior instrumentation. By Dr. Vertell Limber.  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 Continuecurrent medication regimen withGabapentin800 mgBID.[place]ille:Oxycodone 15mg  one tablet5 times a dayas needed for pain#150tabletsandXtampza 36mg  every 12 hours #60.11/22/2020 We will continue the opioid monitoring program, this consists of regular clinic visits, examinations, urine drug screen, pill counts  as well as use of [place] Controlled Substance Reporting system. 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 Anxiety/depression: PCP Following. Continueto monitor.11/22/2020 4. Muscle Spasms: Continuecurrent medication regime withFlexeril. Continue to Monitor.11/22/2020 5. Cervicalgia/ Cervical Radiculitis: Dr SternFollowing.,Continuecurrent medication regime withGabapentin: S/P C5C6C7 T1 Posterior Cervical Fusion with lateral mass fixation with AIRO Imaging, revision of prior instrumentation by Dr. Vertell Limber on 05/17/2017.11/22/2020 7. Bilateral Thoracic Back Pain:Continue HEP as Tolerated and Continue current medication regimen. Continue to Monitor.11/22/2020 8. Left lower extremity DVT/  Phlebitis: S/P endovenous laser ablation of left great saphenous vein on 05/09/2018 by Dr. Scot Dock: Vascular Following.11/22/2020. 9. Insomnia:ContinuePamelor. Continue to Monitor.11/22/2020. 10. Left Knee Pain: No complaints today. Ortho Following.11/22/2020  F/U in 1 month

## 2020-11-30 DIAGNOSIS — I10 Essential (primary) hypertension: Secondary | ICD-10-CM | POA: Diagnosis not present

## 2020-11-30 DIAGNOSIS — E1143 Type 2 diabetes mellitus with diabetic autonomic (poly)neuropathy: Secondary | ICD-10-CM | POA: Diagnosis not present

## 2020-11-30 DIAGNOSIS — E782 Mixed hyperlipidemia: Secondary | ICD-10-CM | POA: Diagnosis not present

## 2020-12-03 ENCOUNTER — Other Ambulatory Visit: Payer: Self-pay | Admitting: Internal Medicine

## 2020-12-07 ENCOUNTER — Ambulatory Visit (INDEPENDENT_AMBULATORY_CARE_PROVIDER_SITE_OTHER): Payer: Medicare Other | Admitting: Internal Medicine

## 2020-12-07 ENCOUNTER — Other Ambulatory Visit: Payer: Self-pay

## 2020-12-07 ENCOUNTER — Encounter: Payer: Self-pay | Admitting: Internal Medicine

## 2020-12-07 VITALS — BP 104/78 | HR 90 | Ht 64.0 in | Wt 163.8 lb

## 2020-12-07 DIAGNOSIS — J4540 Moderate persistent asthma, uncomplicated: Secondary | ICD-10-CM | POA: Diagnosis not present

## 2020-12-07 DIAGNOSIS — T8130XA Disruption of wound, unspecified, initial encounter: Secondary | ICD-10-CM | POA: Diagnosis not present

## 2020-12-07 DIAGNOSIS — M19049 Primary osteoarthritis, unspecified hand: Secondary | ICD-10-CM | POA: Diagnosis not present

## 2020-12-07 DIAGNOSIS — M79641 Pain in right hand: Secondary | ICD-10-CM | POA: Diagnosis not present

## 2020-12-07 DIAGNOSIS — Z4789 Encounter for other orthopedic aftercare: Secondary | ICD-10-CM | POA: Diagnosis not present

## 2020-12-07 DIAGNOSIS — Z981 Arthrodesis status: Secondary | ICD-10-CM | POA: Diagnosis not present

## 2020-12-07 NOTE — Progress Notes (Signed)
OV 04/18/2016  Chief Complaint  Patient presents with   Follow-up    Pt states voice is raspy. Pt c/o of some congestion more in morning, no tightness, and SOB w/ and w/o excertions. Pt states cough comes and goes.     FU asthma - MCT positive - on symbicort FU 29m lung nodules - passive smoking Chronic voice hoarseness nos  Seeing afer 1 year. In interim has seen others. In summer at beach s/p syncope and near cardiac arrest due to dehydratiin per her hx. Since then not the same. Last week having right infrascapular pain that gets worse when she lifts her hand. Also last week or so increased hoarseness of voice is increased cough and wheezing production.She is demanding to have arepeat CT scan of chest for her 382mnodules.She is extremely worried about cancer.   OV 02/27/2017  Chief Complaint  Patient presents with   Follow-up    6 month f/u, moderate asthma, been doing ok but started wheezing at night when she lays down, allergies causing congestion    Follow-up asthma based on methacholine challenge test positivity and chronic hoarseness of voice  Last seen October 2017. She says for the last 7-8 months she's been having nocturnal wheezing that is waking up at night. But in the daytime she does not have any wheezing. She is also also having dysphagia that is chronic. She was referred to ENT particularly in the context of having to have C-spine surgery. Apparently her  Vocal cords were inflamed and he was then asked to take double dose of Protonix which seemed to help the cough but not fully. This is frustrating her. She struggling with understanding the pathophysiology of this. She's taking Symbicort and singulair regularly. She has lost 15 pounds of weight in the last few weeks intentionally according to her and so she does not think acid reflux is a problem.    asthma control questionnaire shows that at night she is waking up a few times because of perceived asthma symptoms.  When she wakes up she is asymptomatic and she is only very slightly limited in her activities by asthma. She is only experiencing very little shortness of breath because of asthma. She is wheezing only a little of the time at night but is using a lot of albuterol for rescue. 5 point average score is 1  feno is 25 ppb and normal 02/27/2017  She is asking for =- new issue of pre op clearance prior to C spine surgery next month   OV 08/30/2017   Chief Complaint  Patient presents with   Follow-up    Pt states she was in the hospital February 13 in [place] due to pna.  Pt does have current complaints of cough, fatigue, hoarseness, and chest tightness. States she is unsure if she is fully over the pna.      Follow-up asthma based on methacholine challenge test positivity and chronic hoarseness of voice  70 year old female follow-up asthma based on methacholine challenge test and chronic hoarseness of voice.  Last seen fall 2018.  After that she tells me that in February 2019 she got hospitalized after suddenly taking ill.  She shows a blood gas results from the outside showing hypoxemia without hypercapnia.  She was not intubated or treated with BiPAP.  She was treated in the medical floor with oxygen and antibiotics.  She says she had hospital-acquired delirium and was diagnosed with right upper lobe pneumonia.  She was  then discharged after a few days.  Since then she is improving slowly but having significant fatigue review of the lab results from [facility] also shows anemia but otherwise chemistries were normal.  I do not have a chest x-ray for my visualization.  Now she tells me that despite being on inhalers for the last 2 days she is having worsening cough that is dry with white mucus no wheezing no orthopnea no proximal nocturnal dyspnea no fever.  She is worried about the cough.     Walking desaturation test on 08/30/2017 185 feet x 3 laps on ROOM AIR:  did not desaturate. Waklked at slow  moderate pace. Rest pulse ox was 99%, final pulse ox was 94%. HR response 96/min at rest to 101/min at peak exertion. Patient ALLAINA BROTZMAN  Did not Desaturate < 88% . Mandeep Ferch [place] yes did  Desaturated </= 3% points. BRYAHNA LESKO yes did get tachyardic. Got fatigued but only mildly     OV 03/28/2018  Subjective:  Patient ID: Syliva Overman, female , DOB: [birth year] , age 70 y.o. , MRN: [id number] , ADDRESS: [place] [address]   03/28/2018 -   Chief Complaint  Patient presents with   Follow-up    still not feeling well- retaining fluid in leg - refused weight    Follow-up asthma based on methacholine challenge test positivity and chronic hoarseness of voice  HPI LOCKLYN HENRIQUEZ 70 y.o. -presents for routine follow-up.  She tells me that through the summer 2019 she has had 2 course of antibiotics and through a full course of prednisone for respiratory exacerbation.  She says she has chronic shortness of breath with exertion relieved by rest without associated chest pain.  She feels that the course is been progressive.  Moderate in intensity occasionally severe.  In addition she has nocturnal wheezing that is waking her up.  This despite using Xopenex and Entocort which seemed to help her.  In addition for the last 8 months or so she is reporting left lower extremity edema that is worse than the right side.  She has tried compression stockings without relief.  This is new report to me.  She says primary care is not referring her to cardiology.  She thinks that overall she is volume overloaded because of the symptom.  She had a stress test several years ago that was normal.  In addition is also reporting chronic hoarseness of voice that is worse.  Last visit with ENT was with Dr. Redmond Baseman approximately 1 year ago.  She still has not followed up with him.  Exam nitric oxide today is 13 ppb and normal asthma control questionnaire with significant amount of symptomatology.  ROS - per  HPI     OV 07/18/2018  Subjective:  Patient ID: Syliva Overman, female , DOB: 1950/12/08 , age 70 y.o. , MRN: [id number] , ADDRESS: [address] [place] [address]   07/18/2018 -   Chief Complaint  Patient presents with   Follow-up    Pt states she is still coughing with thick phlegm and is also hoarse due to her husband walking past her smoking a cigarette. Pt also states she is fatigued, dizzy, and has lost her taste and due to that is not eating that much. Pt states she has to use her xopenex inhaler about 4 times daily.   Follow-up asthma based on methacholine challenge test positivity and tracheobronchomalacia on CT OCt 2019 Fpollowup chronic hoarseness  of voice Foollowup chronic benign nodule oct 2019  HPI SUKAINA TOOTHAKER 70 y.o. -returns for follow-up.  Last seen by myself in October 2019.  After that she has had 3 visits with our nurse practitioners.  Each time for worsening respiratory symptoms and hoarseness of voice.  Each time getting prednisone.  Today she returns and has significant amount of respiratory symptoms.  However she is categorical that these are not changed in the last few weeks of few days in several days.  She says they are all stable.  But the symptom level burden is extremely high.  She is waking up several times at night.  When she wakes up she has mild symptoms.  She is moderately limited in her activities.  She has a little shortness of breath.  Wheezing moderate amount of the time and using albuterol for rescue multiple times daily.  This is despite being on chronic stable inhalers.  Of note, her CT scan in October 2019 showed tracheobronchomalacia.  So we sent her back to Dr. Redmond Baseman in ENT.  She says she has seen him.  I do not have the report with me.  Apparently the vocal cords were red.  Only supportive care advised.  In addition now she is having a new complaint of chronic dysphagia for the last 1 year.  And that she get stuck with mashed potato.  Her  last GI evaluation was 10 years ago in [place].  At that time she did not have these current problems.  She is willing to see GI at this point.  She is also open to having a tertiary care referral.     Results for SUMMERLYN, FICKEL (MRN [id number])  Ref. Range 12/24/2014 16:53 08/30/2017  07/18/2018   Nitric Oxide Unknown 27 Could not perform 14 ppb   IMPRESSION: 1. No findings to suggest interstitial lung disease. 2. Moderate air trapping indicative of small airways disease. 3. 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 4. Innumerable tiny 1-2 mm pulmonary nodules scattered throughout the lungs bilaterally, stable dating back to 2017, considered definitively benign. 5. Hepatic steatosis.     Electronically Signed   By: Vinnie Langton M.D.   On: 04/12/2018 11:09   ROS - per HPI   OV 04/01/2019  Subjective:  Patient ID: Syliva Overman, female , DOB: 04/04/1951 , age 60 y.o. , MRN: [id number] , ADDRESS: [place] [address]   04/01/2019 -   Chief Complaint  Patient presents with   Follow-up    Pt states overall she feels her breathing is unchanged since last OV in 12/2018. However, pt c/o desats to high 80's when lyning down for bed, pt doesnt lay flat. Pt also c/o daily nausea and a couple episodes of vomiting in the last 2 weeks. Pt states she has lost 6 lbs over the last couple weeks. Pt denies cough, CP/tightness and f/c/s.    Follow-up asthma based on methacholine challenge test positivity and tracheobronchomalacia on CT OCt 2019 Fpollowup chronic hoarseness of voice with associated red vocal cords per history of examination by Dr. Redmond Baseman ENT 2019 Foollowup chronic benign nodule oct 2019  HPI MAGDELENE RUARK 70 y.o. -returns for follow-up.  Last seen January 2020 and then lost to follow-up because of the pandemic.  In the interim in June 2020 she got admitted for hypercapnia at [facility].  She was discharged a day later.  Never got intubated.  Apparently  she responded to Narcan.  Heavy pain medication opioid use  was documented but she says this was not a overdose admission.  She refuses to have blood gas test again.  Currently overall she is feeling well.  However she is lost 6 pounds of weight unintentionally.  And she is having intermittent nausea and vomiting of moderate intensity.  Review of medication shows polypharmacy associated with multiple opioid medications but she does not think this is the reason.  She is worried about malignancy.  She is asking for a CT scan of the chest for GI symptoms.  Most recent CT scan of the chest was in October 2019 and other than chronic stable benign nodules no ILD or other findings.  Of note she is also having dysphagia that is chronic.  I referred her to GI in early 2020 but because of the pandemic this did not happen.  She is willing to get referred to GI again.  She also thinks her pulse ox drops at night.  07/29/2019 Follow up: Asthma  Patient presents for a 1 month follow-up.  Patient was seen last visit for an asthmatic bronchitic exacerbation.  She was treated with Augmentin and a prednisone taper.  Her Symbicort was increased from 80-160.  Patient says she is starting to feel better.  She has decreased cough and wheezing.  Unfortunately she did not pick up her Symbicort because pharmacist did not have it.  Patient denies any chest pain orthopnea PND or increased leg swelling.   ROS - per HPI   OV 11/11/2019  Subjective:  Patient ID: Syliva Overman, female , DOB: [birth year] , age 44 y.o. , MRN: [id number] , ADDRESS: [address] [place] [address]   Follow-up asthma based on methacholine challenge test positivity and tracheobronchomalacia on CT OCt 2019 Fpollowup chronic hoarseness of voice with associated red vocal cords per history of examination by Dr. Redmond Baseman ENT 2019 Foollowup chronic benign nodule oct 2019 -3 mm Follow-up chronic cough  11/11/2019 -   Chief Complaint  Patient presents with    Follow-up     HPI SADDIE SANDEEN 70 y.o. -returns for follow-up.  Last seen by myself in October 2020 last seen by nurse practitioner February 2021.  Nurse practitioner picked up on the fact she was on Fosamax.  She asked the patient to stop it but patient has not stopped it because of fear of osteoporosis.  She says she tried alternative Boniva but does not know why this was changed to Fosamax.  She is willing to try another alternative because of acid reflux.  Last visit I referred her to GI but she no showed with Dr. Lucio Edward but she says this was because of personal issues with Dr. Fuller Plan will not follow her anymore.  She says at this point she just wants to try stopping the Fosamax and seeing what her symptoms do.  She says she continues to have significant acid reflux and cough and hoarseness.  In the last 1 month she thinks asthma is affected a little of the time and she is been short of breath once or twice a week.  She is also used her rescue nebulizer inhaler 2-3 times per week but she believes her asthma to be well controlled.  She is some amount of social stress because her mom is ill and she is having to take her mom to a different doctor visits.  Also she wants switch from her Xopenex to albuterol inhaler because of insurance reasons.  She wants to have a repeat CT scan of  the chest if her GI symptoms have not resolved especially her cough particularly after stopping the Fosamax.    After completing the office visit.  She call me back again.  She had several questions about Covid vaccine.  She is scared to get it because of the side effects.  She says all her doctors have recommended she get it.  She said previous vaccines without any problems.  She does not have any allergy to injectables or vaccines.  She wanted to know the mechanism of action and the ingredients in the vaccine.    OV 05/18/2020  Subjective:  Patient ID: Syliva Overman, female , DOB: December 14, 1950 , age 43 y.o. , MRN:  [id number] , ADDRESS: [place] [address] PCP Neale Burly, MD Patient Care Team: Neale Burly, MD as PCP - General (Unknown Physician Specialty)  This Provider for this visit: Treatment Team:  Attending Provider: Brand Males, MD    05/18/2020 -   Chief Complaint  Patient presents with   Follow-up    Asthma, having back pain and more SOB and cough at night    Follow-up asthma based on methacholine challenge test positivity and tracheobronchomalacia on CT OCt 2019 Fpollowup chronic hoarseness of voice with associated red vocal cords per history of examination by Dr. Redmond Baseman ENT 2019 Foollowup chronic benign nodule oct 2019 -3 mm Follow-up chronic cough  Last CT scan of the chest October 2019  HPI TAKESHIA WENK 70 y.o. -last visit May 2021.  After that overall she has been doing well.  She was able to get the Covid vaccine.  She says for the last few weeks she has been having some congestion with on and off coughing and discolored mucus production.  She feels she needs antibiotic and prednisone.  In addition she says that she has lower rib pain coming back.  Therefore she is asking for repeat CT chest.  Last one was over 2 years ago.  She stopped Fosamax and is unclear if this is actually helped her respiratory symptoms.  Based on symptomatology her ACT score is eight showing significant symptoms.  We do not have a baseline on that.  Previously nitric oxide normal.  Today on repeat ius 29 and in grey zone.  She is interested in getting prednisone antibiotic.  In addition she is asking for CT scan because of previous history of nodules and has been over 2 years since she had a CT chest.  She is certainly nervous about it.     Asthma Control Test ACT Total Score  05/18/2020 8      Lab Results  Component Value Date   NITRICOXIDE 14 07/18/2018     ROS - per HPI OV 12/07/2020  Subjective:  Patient ID: Syliva Overman, female , DOB: 1950-12-06 , age 89  y.o. , MRN: [id number] , ADDRESS: [address] [place] [address] PCP Neale Burly, MD Patient Care Team: Neale Burly, MD as PCP - General (Unknown Physician Specialty)  This Provider for this visit: Treatment Team:  Attending Provider: Brand Males, MD  Follow-up asthma based on methacholine challenge test positivity and tracheobronchomalacia on CT OCt 2019 Fpollowup chronic hoarseness of voice with associated red vocal cords per history of examination by Dr. Redmond Baseman ENT 2019 Foollowup chronic benign nodule oct 2019 -3 mm Follow-up chronic cough   12/07/2020 -   Chief Complaint  Patient presents with   Follow-up    Pt states she has been doing  okay since last visit and denies any real complaints.     HPI KARENNA ROMANOFF 70 y.o. -returns for follow-up.  She is on Symbicort.  She is doing well.  ACT symptom score is improved from 8-19.  Is the best she has felt in a long time.  She says primary care gave her a 3% hypertonic saline nebulizer but its not helping.  In addition nebulizer machine is broken she wants a new one from the DME company.  She has 3 cats and she believes they are not allergic to her.  There are no other new issues.  She is finding the humidity a little bit difficult for the summer but otherwise okay    Asthma Control Test ACT Total Score  12/07/2020 19  05/18/2020 8     Lab Results  Component Value Date   NITRICOXIDE 14 07/18/2018     CT Chest data 06/02/2020  TECHNIQUE: Multidetector CT imaging of the chest was performed following the standard protocol without IV contrast.   COMPARISON:  04/11/2018   FINDINGS: Cardiovascular:  No acute findings.   Mediastinum/Nodes: No masses or pathologically enlarged lymph nodes identified on this unenhanced exam.   Lungs/Pleura: Mild scarring again seen in the inferior lingula and anterior right lower lobe. Scattered tiny 1-2 mm nodules are seen in both lungs, predominantly in the lower lobes.  These are unchanged compared with previous studies, consistent with benign etiology. No suspicious nodules or masses identified. No evidence of infiltrate or pleural effusion.   Upper Abdomen:  Unremarkable.   Musculoskeletal:  No suspicious bone lesions.   IMPRESSION: Stable tiny 1-2 mm bilateral pulmonary nodules, consistent with benign etiology. No active disease.     Electronically Signed   By: Marlaine Hind M.D.   On: 06/03/2020 09:12  No results found.    PFT  No flowsheet data found.     has a past medical history of Acute sinusitis, unspecified, Allergy, Anemia, Anxiety state, unspecified, Arachnoiditis (BILATERAL LEGS), Arthritis, Asthma, Blood transfusion, Cancer ([place]), Cardiomyopathy (HX --06/2010), Chronic back pain greater than 3 months duration, CSF leak, Diabetes mellitus without complication ([place]), Dyslipidemia, Dysphagia, Dysrhythmia, Essential hypertension, benign, Family history of adverse reaction to anesthesia, GERD (gastroesophageal reflux disease) (AND HIATIAL HERNIA), Headache(784.0), History of chronic bronchitis, bladder infections, Hyperlipidemia, Hypertension, Neuromuscular disorder ([place]), Osteoporosis, Other malaise and fatigue, PONV (postoperative nausea and vomiting), Shortness of breath, Spinal headache, Spinal stenosis, cervical region, Varicosities, and Weakness of both legs (DUE TO ARACHNOIDITIS).   reports that she has never smoked. She has never used smokeless tobacco.  Past Surgical History:  Procedure Laterality Date   ABDOMINAL HYSTERECTOMY  1987   W/ BSO   ANTERIOR CERVICAL DECOMP/DISCECTOMY FUSION N/A 08/09/2012   Procedure: ANTERIOR CERVICAL DECOMPRESSION/DISCECTOMY FUSION 1 LEVEL;  Surgeon: Floyce Stakes, MD;  Location: [facility];  Service: Neurosurgery;  Laterality: N/A;  Cervical four-five  Anterior cervical decompression/diskectomy/fusion   ANTERIOR FUSION CERVICAL SPINE  2007   C5 -6   APPENDECTOMY     APPLICATION OF  INTRAOPERATIVE CT SCAN N/A 05/17/2017   Procedure: APPLICATION OF INTRAOPERATIVE CT SCAN;  Surgeon: Erline Levine, MD;  Location: [facility];  Service: Neurosurgery;  Laterality: N/A;   BACK SURGERY     x 5   BREAST EXCISIONAL BIOPSY Bilateral    No scar seen    BREAST SURGERY     x 2 biopsies   CARPAL TUNNEL RELEASE  RIGHT - 2001  & DEC 2011 W/ BACK SURG.  CERVICAL DISC SURGERY  2005   C5 - 6   CHOLECYSTECTOMY  1994   DORSAL COMPARTMENT RELEASE Right 04/07/2013   Procedure: RIGHT WRIST STS RELEASE;  Surgeon: Schuyler Amor, MD;  Location: [facility];  Service: Orthopedics;  Laterality: Right;   ENDOVENOUS ABLATION SAPHENOUS VEIN W/ LASER Right 05/29/2019   endovenous laser ablation right greater saphenous vein by Gae Gallop MD    EXPLORATION OF INCISION FOR CSF LEAK  DEC 2011  X2   POST [place] Right 04/07/2013   Procedure: RIGHT INDEX AND RIGHT LONG DISTAL INTERPHALANGEAL JOINT FUSIONS;  Surgeon: Schuyler Amor, MD;  Location: [facility];  Service: Orthopedics;  Laterality: Right;   FINGER ARTHROPLASTY Right 04/07/2013   Procedure: RIGHT THUMB [place] ARTHROPLASTY;  Surgeon: Schuyler Amor, MD;  Location: [facility];  Service: Orthopedics;  Laterality: Right;   GANGLION CYST EXCISION Right 04/07/2013   Procedure: RIGHT WRIST MASS EXCISION;  Surgeon: Schuyler Amor, MD;  Location: [facility];  Service: Orthopedics;  Laterality: Right;   KNEE ARTHROSCOPY  LEFT X3 (LAST ONE 2005)   KNEE ARTHROSCOPY  05/17/2011   Procedure: ARTHROSCOPY KNEE;  Surgeon: Bradley Ferris III;  Location: [facility];  Service: Orthopedics;  Laterality: Right;  WITH MEDIAL MENISECTOMY AND removal of suprapatella fat lump   LEFT WRIST TENOSYNECTOMY W/ LEFT THUMB JOINT REPAIR  02-24-10   LUMBAR FUSION  11-30-10   L4 - 5   LUMBAR FUSION  2000   L2 - 4   LUMBAR LAMINECTOMY  DEC 2011   L4 - 5   POSTERIOR  CERVICAL FUSION/FORAMINOTOMY N/A 05/17/2017   Procedure: Cervical five  to Thorastic one  Posterior cervical fusion with lateral mass screws/revision of prior instrumentation;  Surgeon: Erline Levine, MD;  Location: [facility];  Service: Neurosurgery;  Laterality: N/A;  C5 to T1 Posterior cervical fusion with lateral mass screws/revision of prior instrumentation   TENDON REPAIR  JAN 2010   LEFT INDEX AND LONG FINGERS   THUMB SURGERY   04/07/2019   RIGHT THUMB   TUBAL LIGATION      Allergies  Allergen Reactions   Latex Itching and Rash   Morphine And Related Hives and Itching   Acetaminophen    Morphine    Oxycodone Hcl    Aspirin Nausea And Vomiting    Can take coated asa   Biaxin [Clarithromycin] Nausea And Vomiting   Clarithromycin Diarrhea   Codeine Itching   Darvocet [Propoxyphene N-Acetaminophen] Itching   Hydrocodone-Acetaminophen Itching   Lortab [Hydrocodone-Acetaminophen] Itching   Percocet [Oxycodone-Acetaminophen] Itching   Tramadol Nausea Only    Immunization History  Administered Date(s) Administered   Fluad Quad(high Dose 65+) 04/01/2019   Influenza Split 04/19/2013, 03/18/2014   Influenza, High Dose Seasonal PF 04/25/2018   Influenza,inj,Quad PF,6+ Mos 05/04/2015, 02/27/2017   PFIZER(Purple Top)SARS-COV-2 Vaccination 03/06/2020, 03/25/2020   Pneumococcal Polysaccharide-23 04/19/2013   Zoster, Live 05/30/2013    Family History  Problem Relation Age of Onset   Hypertension Mother    Heart disease Mother        Mitral valve replacement.    Diabetes Mother    Cancer Father    Diabetes Brother    Colon cancer Neg Hx    Colon polyps Neg Hx    Esophageal cancer Neg Hx    Rectal cancer Neg Hx    Stomach cancer Neg Hx      Current Outpatient Medications:  albuterol (VENTOLIN HFA) 108 (90 Base) MCG/ACT inhaler, INHALE 2 PUFFS EVERY 6 HOURS AS NEEDED FOR WHEEZING/ SHORTNESS OF BREATH, Disp: 8.5 g, Rfl: 4   aspirin EC 81 MG tablet, Take 81 mg by mouth at  bedtime. , Disp: , Rfl:    budesonide-formoterol (SYMBICORT) 160-4.5 MCG/ACT inhaler, Inhale 2 puffs into the lungs 2 (two) times daily., Disp: 1 Inhaler, Rfl: 5   Calcium Carbonate-Vit D-Min 1200-1000 MG-UNIT CHEW, Chew 1 tablet by mouth daily., Disp: , Rfl:    diltiazem (CARDIZEM CD) 240 MG 24 hr capsule, Take 240 mg by mouth daily., Disp: , Rfl:    estradiol (ESTRACE) 2 MG tablet, Take 2 mg by mouth daily. , Disp: , Rfl:    gabapentin (NEURONTIN) 800 MG tablet, Take 1 tablet (800 mg total) by mouth 3 (three) times daily., Disp: 90 tablet, Rfl: 3   levalbuterol (XOPENEX HFA) 45 MCG/ACT inhaler, Inhale 1-2 puffs into the lungs every 4 (four) hours as needed. For shortness of breath, Disp: 1 Inhaler, Rfl: 6   losartan-hydrochlorothiazide (HYZAAR) 50-12.5 MG tablet, Take 1 tablet by mouth daily., Disp: , Rfl:    metFORMIN (GLUCOPHAGE) 500 MG tablet, Take 500 mg by mouth 2 (two) times daily with a meal. , Disp: , Rfl:    montelukast (SINGULAIR) 10 MG tablet, Take 10 mg by mouth at bedtime. Pt takes in the morning., Disp: , Rfl:    nortriptyline (PAMELOR) 25 MG capsule, Take 1 capsule (25 mg total) by mouth at bedtime., Disp: 30 capsule, Rfl: 2   omeprazole (PRILOSEC) 40 MG capsule, Take 40 mg by mouth daily., Disp: , Rfl:    oxyCODONE (ROXICODONE) 15 MG immediate release tablet, Take 1 tablet (15 mg total) by mouth 5 (five) times daily as needed for pain., Disp: 150 tablet, Rfl: 0   oxyCODONE ER (XTAMPZA ER) 36 MG C12A, Take 1 capsule (36 mg total) by mouth every 12 (twelve) hours., Disp: 60 capsule, Rfl: 0   promethazine (PHENERGAN) 25 MG tablet, Take 25 mg by mouth as needed. , Disp: , Rfl:    simvastatin (ZOCOR) 10 MG tablet, Take 10 mg by mouth daily., Disp: , Rfl:    topiramate (TOPAMAX) 25 MG tablet, Take 1 tablet (25 mg total) by mouth 2 (two) times daily., Disp: 60 tablet, Rfl: 2   valACYclovir (VALTREX) 500 MG tablet, Take 500 mg by mouth daily. , Disp: , Rfl:    naloxone (NARCAN) nasal  spray 4 mg/0.1 mL, Place 1 spray into the nose once as needed (Just in case of Opiod Overdose). (Patient not taking: Reported on 12/07/2020), Disp: , Rfl:  No current facility-administered medications for this visit.  Facility-Administered Medications Ordered in Other Visits:    levalbuterol (XOPENEX) nebulizer solution 0.63 mg, 0.63 mg, Nebulization, Once, Parrett, Tammy S, NP      Objective:   Vitals:   12/07/20 1343  BP: 104/78  Pulse: 90  SpO2: 97%  Weight: 163 lb 12.8 oz (74.3 kg)  Height: 5\' 4"  (1.626 m)    Estimated body mass index is 28.12 kg/m as calculated from the following:   Height as of this encounter: 5\' 4"  (1.626 m).   Weight as of this encounter: 163 lb 12.8 oz (74.3 kg).  @WEIGHTCHANGE @  [employer]   12/07/20 1343  Weight: 163 lb 12.8 oz (74.3 kg)     Physical Exam  General: No distress. Looks well Neuro: Alert and Oriented x 3. GCS 15. Speech normal Psych: Pleasant Resp:  Barrel Chest -  no.  Wheeze - no, Crackles - no, No overt respiratory distress CVS: Normal heart sounds. Murmurs - no Ext: Stigmata of Connective Tissue Disease - no HEENT: Normal upper airway. PEERL +. No post nasal drip        Assessment:       ICD-10-CM   1. Moderate persistent asthma without complication  J57.01          Plan:     Patient Instructions     ICD-10-CM   1. Moderate asthma with exacerbation, unspecified whether persistent  J45.901      Glad you are much better than before T0o get your nebulizer machine is not working well Understandable that hypertonic nebulized saline will not work for your condition because you do not have bronchiectasis with mucous  Plan - Continue Symbicort any other medications as before - Get DME prescription for a new nebulizer machine for albuterol as needed  Follow-up - 9 months or sooner if needed  Glad you are much better than before T0o get your nebulizer machine is not working well Understandable that  hypertonic nebulized saline will not work for your condition because you do not have bronchiectasis with mucous  Plan - Continue Symbicort any other medications as before - Get DME prescription for a new nebulizer machine for albuterol as needed  Follow-up - 9 months or sooner if needed   SIGNATURE    Dr. Brand Males, M.D., F.C.C.P,  Pulmonary and Critical Care Medicine Staff Physician, [place] Director - Interstitial Lung Disease  Program  [facility] [place] [facility] [place], [place], [address]  Pager: [phone], If no answer or between  15:00h - 7:00h: call [phone] Telephone: [phone]  2:17 PM 12/07/2020

## 2020-12-07 NOTE — Patient Instructions (Addendum)
ICD-10-CM   1. Moderate asthma with exacerbation, unspecified whether persistent  J45.901      Glad you are much better than before T0o get your nebulizer machine is not working well Understandable that hypertonic nebulized saline will not work for your condition because you do not have bronchiectasis with mucous  Plan - Continue Symbicort any other medications as before - Get DME prescription for a new nebulizer machine for albuterol as needed  Follow-up - 9 months or sooner if needed

## 2020-12-13 ENCOUNTER — Telehealth: Payer: Self-pay

## 2020-12-13 NOTE — Telephone Encounter (Signed)
Patient calls today c/o a few months of LLE swelling. She had a laser ablation on the left leg on 05/09/2018. Says "I'm even having trouble getting my flip flops on." Denies redness or heat. Advised patient I was going to speak to [place] and call back to schedule her an appointment tomorrow. Patient verbalizes understanding.

## 2020-12-14 ENCOUNTER — Telehealth: Payer: Self-pay | Admitting: Registered Nurse

## 2020-12-14 MED ORDER — XTAMPZA ER 27 MG PO C12A: 1.0000 | EXTENDED_RELEASE_CAPSULE | Freq: Two times a day (BID) | ORAL | 0 refills | Status: DC

## 2020-12-22 ENCOUNTER — Encounter: Payer: Medicare Other | Attending: Physical Medicine & Rehabilitation | Admitting: Registered Nurse

## 2020-12-22 ENCOUNTER — Other Ambulatory Visit: Payer: Self-pay

## 2020-12-22 ENCOUNTER — Telehealth: Payer: Self-pay | Admitting: *Deleted

## 2020-12-22 ENCOUNTER — Encounter: Payer: Self-pay | Admitting: Registered Nurse

## 2020-12-22 ENCOUNTER — Other Ambulatory Visit: Payer: Self-pay | Admitting: *Deleted

## 2020-12-22 VITALS — BP 98/65 | HR 99 | Temp 98.3°F | Ht 64.0 in | Wt 160.2 lb

## 2020-12-22 DIAGNOSIS — Z79891 Long term (current) use of opiate analgesic: Secondary | ICD-10-CM | POA: Diagnosis not present

## 2020-12-22 DIAGNOSIS — M5412 Radiculopathy, cervical region: Secondary | ICD-10-CM | POA: Diagnosis not present

## 2020-12-22 DIAGNOSIS — G8929 Other chronic pain: Secondary | ICD-10-CM | POA: Insufficient documentation

## 2020-12-22 DIAGNOSIS — M961 Postlaminectomy syndrome, not elsewhere classified: Secondary | ICD-10-CM | POA: Insufficient documentation

## 2020-12-22 DIAGNOSIS — M546 Pain in thoracic spine: Secondary | ICD-10-CM | POA: Diagnosis not present

## 2020-12-22 DIAGNOSIS — M542 Cervicalgia: Secondary | ICD-10-CM | POA: Diagnosis not present

## 2020-12-22 DIAGNOSIS — Z5181 Encounter for therapeutic drug level monitoring: Secondary | ICD-10-CM | POA: Insufficient documentation

## 2020-12-22 DIAGNOSIS — G894 Chronic pain syndrome: Secondary | ICD-10-CM | POA: Insufficient documentation

## 2020-12-22 DIAGNOSIS — I83812 Varicose veins of left lower extremities with pain: Secondary | ICD-10-CM

## 2020-12-22 DIAGNOSIS — M5416 Radiculopathy, lumbar region: Secondary | ICD-10-CM | POA: Diagnosis not present

## 2020-12-22 DIAGNOSIS — M79662 Pain in left lower leg: Secondary | ICD-10-CM

## 2020-12-22 MED ORDER — OXYCODONE HCL 15 MG PO TABS: 15.0000 mg | ORAL_TABLET | Freq: Every day | ORAL | 0 refills | Status: DC | PRN

## 2020-12-22 NOTE — Telephone Encounter (Signed)
Unable to speak to patient.  Left her a telephone voice message to call me regarding an appointment.

## 2020-12-22 NOTE — Telephone Encounter (Signed)
[name] [name] c/o left leg swelling for 3 months.  She is s/p EVLA left GSV in November 2019 by Gae Gallop MD. [name] [name] states she has not been wearing her compression hose but has been elevating her legs and states she has been taking a diuretic without improvement in leg swelling.  Advised her to wear compression hose every day and continue elevating her legs when sitting.  Made appointment for venous reflux (left leg, order in Epic) and VV FU with Dr. Scot Dock on 01-05-2021 at 10:00 AM and 10:40 AM respectively.

## 2020-12-22 NOTE — Progress Notes (Signed)
Subjective:    Patient ID: [name] [name], female    DOB: June 09, 1951, 70 y.o.   MRN: [id number]  HPI: [name] [name] is a 70 y.o. female who returns for follow up appointment for chronic pain and medication refill. She states her  pain is located in her neck radiating into her bilateral shoulders, mid- lower back pain radiating into her bilateral lower extremities. She rates her pain 7. Her current exercise regime is walking and performing stretching exercises.  [name] Morphine equivalent is 237.50  MME., change in her medication will decrease her MME to 202.50 MMELast UDS was Performed on 09/22/2020, it was consistent.        Pain Inventory Average Pain 7 Pain Right Now 7 My pain is constant, sharp, burning, stabbing, and tingling  In the last 24 hours, has pain interfered with the following? General activity 7 Relation with others 7 Enjoyment of life 7 What TIME of day is your pain at its worst? morning , daytime, evening, and night Sleep (in general) Fair  Pain is worse with: walking, bending, sitting, inactivity, standing, and some activites Pain improves with: rest, pacing activities, medication, and heat Relief from Meds: 7  Family History  Problem Relation Age of Onset   Hypertension Mother    Heart disease Mother        Mitral valve replacement.    Diabetes Mother    Cancer Father    Diabetes Brother    Colon cancer Neg Hx    Colon polyps Neg Hx    Esophageal cancer Neg Hx    Rectal cancer Neg Hx    Stomach cancer Neg Hx    Social History   Socioeconomic History   Marital status: Divorced    Spouse name: Not on file   Number of children: Not on file   Years of education: Not on file   Highest education level: Not on file  Occupational History   Occupation: disable    Employer: DISABLED  Tobacco Use   Smoking status: Never   Smokeless tobacco: Never  Vaping Use   Vaping Use: Never used  Substance and Sexual Activity   Alcohol use: No   Drug use: No    Sexual activity: Not on file    Comment: second hand smoke  Other Topics Concern   Not on file  Social History Narrative   Has one child lives at home with her.    Social Determinants of Health   Financial Resource Strain: Not on file  Food Insecurity: Not on file  Transportation Needs: Not on file  Physical Activity: Not on file  Stress: Not on file  Social Connections: Not on file   Past Surgical History:  Procedure Laterality Date   ABDOMINAL HYSTERECTOMY  1987   W/ [place] DECOMP/DISCECTOMY FUSION N/A 08/09/2012   Procedure: ANTERIOR CERVICAL DECOMPRESSION/DISCECTOMY FUSION 1 LEVEL;  Surgeon: Floyce Stakes, MD;  Location: [facility];  Service: Neurosurgery;  Laterality: N/A;  Cervical four-five  Anterior cervical decompression/diskectomy/fusion   ANTERIOR FUSION CERVICAL SPINE  2007   C5 -6   APPENDECTOMY     APPLICATION OF INTRAOPERATIVE CT SCAN N/A 05/17/2017   Procedure: APPLICATION OF INTRAOPERATIVE CT SCAN;  Surgeon: Erline Levine, MD;  Location: [facility];  Service: Neurosurgery;  Laterality: N/A;   BACK SURGERY     x 5   BREAST EXCISIONAL BIOPSY Bilateral    No scar seen    BREAST SURGERY  x 2 biopsies   CARPAL TUNNEL RELEASE  RIGHT - 2001  & DEC 2011 W/ BACK SURG.   CERVICAL DISC SURGERY  2005   C5 - 6   CHOLECYSTECTOMY  1994   DORSAL COMPARTMENT RELEASE Right 04/07/2013   Procedure: RIGHT WRIST STS RELEASE;  Surgeon: Schuyler Amor, MD;  Location: [facility];  Service: Orthopedics;  Laterality: Right;   ENDOVENOUS ABLATION SAPHENOUS VEIN W/ LASER Right 05/29/2019   endovenous laser ablation right greater saphenous vein by Gae Gallop MD    EXPLORATION OF INCISION FOR CSF LEAK  DEC 2011  X2   POST [place] Right 04/07/2013   Procedure: RIGHT INDEX AND RIGHT LONG DISTAL INTERPHALANGEAL JOINT FUSIONS;  Surgeon: Schuyler Amor, MD;  Location: [facility];  Service: Orthopedics;   Laterality: Right;   FINGER ARTHROPLASTY Right 04/07/2013   Procedure: RIGHT THUMB [place] ARTHROPLASTY;  Surgeon: Schuyler Amor, MD;  Location: [facility];  Service: Orthopedics;  Laterality: Right;   GANGLION CYST EXCISION Right 04/07/2013   Procedure: RIGHT WRIST MASS EXCISION;  Surgeon: Schuyler Amor, MD;  Location: [facility];  Service: Orthopedics;  Laterality: Right;   KNEE ARTHROSCOPY  LEFT X3 (LAST ONE 2005)   KNEE ARTHROSCOPY  05/17/2011   Procedure: ARTHROSCOPY KNEE;  Surgeon: Bradley Ferris III;  Location: [facility];  Service: Orthopedics;  Laterality: Right;  WITH MEDIAL MENISECTOMY AND removal of suprapatella fat lump   LEFT WRIST TENOSYNECTOMY W/ LEFT THUMB JOINT REPAIR  02-24-10   LUMBAR FUSION  11-30-10   L4 - 5   LUMBAR FUSION  2000   L2 - 4   LUMBAR LAMINECTOMY  DEC 2011   L4 - 5   POSTERIOR CERVICAL FUSION/FORAMINOTOMY N/A 05/17/2017   Procedure: Cervical five  to Thorastic one  Posterior cervical fusion with lateral mass screws/revision of prior instrumentation;  Surgeon: Erline Levine, MD;  Location: [facility];  Service: Neurosurgery;  Laterality: N/A;  C5 to T1 Posterior cervical fusion with lateral mass screws/revision of prior instrumentation   TENDON REPAIR  JAN 2010   LEFT INDEX AND LONG FINGERS   THUMB SURGERY   04/07/2019   RIGHT THUMB   TUBAL LIGATION     Past Surgical History:  Procedure Laterality Date   ABDOMINAL HYSTERECTOMY  1987   W/ BSO   ANTERIOR CERVICAL DECOMP/DISCECTOMY FUSION N/A 08/09/2012   Procedure: ANTERIOR CERVICAL DECOMPRESSION/DISCECTOMY FUSION 1 LEVEL;  Surgeon: Floyce Stakes, MD;  Location: [facility];  Service: Neurosurgery;  Laterality: N/A;  Cervical four-five  Anterior cervical decompression/diskectomy/fusion   ANTERIOR FUSION CERVICAL SPINE  2007   C5 -6   APPENDECTOMY     APPLICATION OF INTRAOPERATIVE CT SCAN N/A 05/17/2017   Procedure: APPLICATION OF INTRAOPERATIVE CT SCAN;   Surgeon: Erline Levine, MD;  Location: [facility];  Service: Neurosurgery;  Laterality: N/A;   BACK SURGERY     x 5   BREAST EXCISIONAL BIOPSY Bilateral    No scar seen    BREAST SURGERY     x 2 biopsies   CARPAL TUNNEL RELEASE  RIGHT - 2001  & DEC 2011 W/ BACK SURG.   CERVICAL DISC SURGERY  2005   C5 - 6   CHOLECYSTECTOMY  1994   DORSAL COMPARTMENT RELEASE Right 04/07/2013   Procedure: RIGHT WRIST STS RELEASE;  Surgeon: Schuyler Amor, MD;  Location: [facility];  Service: Orthopedics;  Laterality: Right;  ENDOVENOUS ABLATION SAPHENOUS VEIN W/ LASER Right 05/29/2019   endovenous laser ablation right greater saphenous vein by Gae Gallop MD    EXPLORATION OF INCISION FOR CSF LEAK  DEC 2011  X2   POST [place] Right 04/07/2013   Procedure: RIGHT INDEX AND RIGHT LONG DISTAL INTERPHALANGEAL JOINT FUSIONS;  Surgeon: Schuyler Amor, MD;  Location: [facility];  Service: Orthopedics;  Laterality: Right;   FINGER ARTHROPLASTY Right 04/07/2013   Procedure: RIGHT THUMB [place] ARTHROPLASTY;  Surgeon: Schuyler Amor, MD;  Location: [facility];  Service: Orthopedics;  Laterality: Right;   GANGLION CYST EXCISION Right 04/07/2013   Procedure: RIGHT WRIST MASS EXCISION;  Surgeon: Schuyler Amor, MD;  Location: [facility];  Service: Orthopedics;  Laterality: Right;   KNEE ARTHROSCOPY  LEFT X3 (LAST ONE 2005)   KNEE ARTHROSCOPY  05/17/2011   Procedure: ARTHROSCOPY KNEE;  Surgeon: Bradley Ferris III;  Location: [facility];  Service: Orthopedics;  Laterality: Right;  WITH MEDIAL MENISECTOMY AND removal of suprapatella fat lump   LEFT WRIST TENOSYNECTOMY W/ LEFT THUMB JOINT REPAIR  02-24-10   LUMBAR FUSION  11-30-10   L4 - 5   LUMBAR FUSION  2000   L2 - 4   LUMBAR LAMINECTOMY  DEC 2011   L4 - 5   POSTERIOR CERVICAL FUSION/FORAMINOTOMY N/A 05/17/2017   Procedure: Cervical five  to Thorastic one   Posterior cervical fusion with lateral mass screws/revision of prior instrumentation;  Surgeon: Erline Levine, MD;  Location: [facility];  Service: Neurosurgery;  Laterality: N/A;  C5 to T1 Posterior cervical fusion with lateral mass screws/revision of prior instrumentation   TENDON REPAIR  JAN 2010   LEFT INDEX AND LONG FINGERS   THUMB SURGERY   04/07/2019   RIGHT THUMB   TUBAL LIGATION     Past Medical History:  Diagnosis Date   Acute sinusitis, unspecified    Allergy    Anemia    "quite a few times"   Anxiety state, unspecified    Arachnoiditis BILATERAL LEGS   DUE TO MULTIPLE BACK SURG.'S   Arthritis    Asthma    last flare up was 03/2017 lasted over a month   Blood transfusion    Cancer ([facility])    skin cancers (in scalp)   Cardiomyopathy HX --06/2010   EF was 25% during acute illness (PHELONEPHRITIS) Repeat echo 12-06-10 60% showed normal EF.    Chronic back pain greater than 3 months duration    S/P BACK SURG'S   CSF leak    Diabetes mellitus without complication ([place])    dx 2013 type 2   Dyslipidemia    Dysphagia    some post op cerv fusion 2/14   Dysrhythmia    Essential hypertension, benign    Family history of adverse reaction to anesthesia    mother gets n/v   GERD (gastroesophageal reflux disease) AND HIATIAL HERNIA   CONTROLLED W/ NEXIUM   Headache(784.0)    History of chronic bronchitis    Hx of bladder infections    Hyperlipidemia    TAKE CHLOSTEROL MEDICATION,04/23/19   Hypertension    Neuromuscular disorder (HCC)    numbness and tingling   Osteoporosis    Other malaise and fatigue    PONV (postoperative nausea and vomiting)    Shortness of breath    Spinal headache    Spinal stenosis, cervical region    Varicosities    venous  Weakness of both legs DUE TO ARACHNOIDITIS   OCCASIONAL USES CANE   BP 98/65   Pulse 99   Temp 98.3 F (36.8 C)   Ht 5\' 4"  (1.626 m)   Wt 160 lb 3.2 oz (72.7 kg)   SpO2 96%   BMI 27.50 kg/m   Opioid Risk Score:    Fall Risk Score:  `1  Depression screen PHQ 2/9  Depression screen [facility] 2/9 11/22/2020 10/21/2020 09/22/2020 09/22/2020 07/28/2020 05/31/2020 03/03/2020  Decreased Interest 1 0 0 0 0 0 0  Down, Depressed, Hopeless 1 0 0 0 0 0 0  PHQ - 2 Score 2 0 0 0 0 0 0  Altered sleeping 1 - - - - - -  Tired, decreased energy - - - - - - -  Change in appetite - - - - - - -  Feeling bad or failure about yourself  - - - - - - -  Trouble concentrating - - - - - - -  Moving slowly or fidgety/restless - - - - - - -  Suicidal thoughts - - - - - - -  PHQ-9 Score 3 - - - - - -  Some recent data might be hidden    Review of Systems  Musculoskeletal:  Positive for back pain, gait problem and neck pain.  All other systems reviewed and are negative.     Objective:   Physical Exam Vitals and nursing note reviewed.  Constitutional:      Appearance: Normal appearance.  Neck:     Comments: Cervical Paraspinal Tenderness: C-5-C-6 Cardiovascular:     Rate and Rhythm: Normal rate and regular rhythm.     Pulses: Normal pulses.     Heart sounds: Normal heart sounds.  Pulmonary:     Effort: Pulmonary effort is normal.     Breath sounds: Normal breath sounds.  Musculoskeletal:     Cervical back: Normal range of motion and neck supple.     Comments: Normal Muscle Bulk and Muscle Testing Reveals:  Upper Extremities: Full ROM and Muscle Strength 5/5 Bilateral AC Joint Tenderness Thoracic Paraspinal Tenderness: T-7-T-9 Lumbar Paraspinal Tenderness: L-3-L-5 Lower Extremities: Full ROM and Muscle Strength 5/5 Arises from Table with ease Narrow Based Gait     Skin:    General: Skin is warm and dry.  Neurological:     Mental Status: She is alert and oriented to person, place, and time.  Psychiatric:        Mood and Affect: Mood normal.        Behavior: Behavior normal.         Assessment & Plan:  1. On 05/17/2017 :C5C6C7 T1 Posterior Cervical Fusion with lateral mass fixation with AIRO Imaging, revision of  prior instrumentation. By Dr. Vertell Limber.  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 Continue current medication regimen with  Gabapentin 800 mg BID. [place]: Oxycodone 15mg  one tablet 5 times a day  as needed for pain # 150 tablets and Xtampza 27 mg every 12 hours  #60. 12/28/2020 We will continue the opioid monitoring program, this consists of regular clinic visits, examinations, urine drug screen, pill counts as well as use of [place] Controlled Substance Reporting system. 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 Continue to Monitor. 12/28/2020 3. Anxiety/depression: PCP Following. Continue to monitor. 12/28/2020 4. Muscle Spasms: Continue current  medication regime with Flexeril. Continue to Monitor. 12/28/2020 5. Cervicalgia/ Cervical Radiculitis: Dr Vertell Limber Following. ,Continue current medication regime with Gabapentin:  S/P  C5C6C7 T1 Posterior Cervical Fusion with lateral mass fixation with AIRO Imaging, revision of prior instrumentation by Dr. Vertell Limber on 05/17/2017. 12/28/2020 7. Bilateral Thoracic Back Pain: Continue HEP as Tolerated and Continue current medication regimen. Continue to Monitor. 12/28/2020  8. Left lower extremity DVT/ Phlebitis:  S/P endovenous laser ablation of left great saphenous vein on 05/09/2018 by Dr. Scot Dock:  Vascular  Following. 12/28/2020.  9. Insomnia: Continue Pamelor. Continue to Monitor. 12/28/2020. 10. Left Knee Pain: No complaints today. Ortho Following. 12/28/2020     F/U in 1 month

## 2020-12-28 ENCOUNTER — Encounter: Payer: Self-pay | Admitting: Registered Nurse

## 2020-12-31 DIAGNOSIS — Z20822 Contact with and (suspected) exposure to covid-19: Secondary | ICD-10-CM | POA: Diagnosis not present

## 2021-01-05 ENCOUNTER — Ambulatory Visit: Payer: Medicare Other | Admitting: Vascular Surgery

## 2021-01-05 ENCOUNTER — Encounter (HOSPITAL_COMMUNITY): Payer: Medicare Other

## 2021-01-12 ENCOUNTER — Other Ambulatory Visit: Payer: Self-pay | Admitting: Internal Medicine

## 2021-01-12 DIAGNOSIS — Z1231 Encounter for screening mammogram for malignant neoplasm of breast: Secondary | ICD-10-CM

## 2021-01-14 ENCOUNTER — Other Ambulatory Visit: Payer: Self-pay | Admitting: *Deleted

## 2021-01-14 DIAGNOSIS — G039 Meningitis, unspecified: Secondary | ICD-10-CM

## 2021-01-14 DIAGNOSIS — M961 Postlaminectomy syndrome, not elsewhere classified: Secondary | ICD-10-CM

## 2021-01-14 MED ORDER — NORTRIPTYLINE HCL 25 MG PO CAPS: 25.0000 mg | ORAL_CAPSULE | Freq: Every day | ORAL | 2 refills | Status: DC

## 2021-01-20 ENCOUNTER — Encounter: Payer: Medicare Other | Attending: Physical Medicine & Rehabilitation | Admitting: Registered Nurse

## 2021-01-20 ENCOUNTER — Other Ambulatory Visit: Payer: Self-pay

## 2021-01-20 ENCOUNTER — Encounter: Payer: Self-pay | Admitting: Registered Nurse

## 2021-01-20 VITALS — BP 115/72 | HR 82 | Temp 98.3°F | Ht 64.0 in | Wt 163.0 lb

## 2021-01-20 DIAGNOSIS — M961 Postlaminectomy syndrome, not elsewhere classified: Secondary | ICD-10-CM | POA: Diagnosis not present

## 2021-01-20 DIAGNOSIS — W010XXA Fall on same level from slipping, tripping and stumbling without subsequent striking against object, initial encounter: Secondary | ICD-10-CM | POA: Diagnosis not present

## 2021-01-20 DIAGNOSIS — M546 Pain in thoracic spine: Secondary | ICD-10-CM | POA: Insufficient documentation

## 2021-01-20 DIAGNOSIS — Y92009 Unspecified place in unspecified non-institutional (private) residence as the place of occurrence of the external cause: Secondary | ICD-10-CM

## 2021-01-20 DIAGNOSIS — W19XXXD Unspecified fall, subsequent encounter: Secondary | ICD-10-CM | POA: Diagnosis not present

## 2021-01-20 DIAGNOSIS — M542 Cervicalgia: Secondary | ICD-10-CM | POA: Diagnosis not present

## 2021-01-20 DIAGNOSIS — Z79891 Long term (current) use of opiate analgesic: Secondary | ICD-10-CM | POA: Diagnosis not present

## 2021-01-20 DIAGNOSIS — Z5181 Encounter for therapeutic drug level monitoring: Secondary | ICD-10-CM | POA: Diagnosis not present

## 2021-01-20 DIAGNOSIS — F419 Anxiety disorder, unspecified: Secondary | ICD-10-CM | POA: Insufficient documentation

## 2021-01-20 DIAGNOSIS — M5416 Radiculopathy, lumbar region: Secondary | ICD-10-CM | POA: Insufficient documentation

## 2021-01-20 DIAGNOSIS — M5412 Radiculopathy, cervical region: Secondary | ICD-10-CM | POA: Insufficient documentation

## 2021-01-20 DIAGNOSIS — Z86718 Personal history of other venous thrombosis and embolism: Secondary | ICD-10-CM | POA: Insufficient documentation

## 2021-01-20 DIAGNOSIS — G894 Chronic pain syndrome: Secondary | ICD-10-CM | POA: Diagnosis not present

## 2021-01-20 DIAGNOSIS — M62838 Other muscle spasm: Secondary | ICD-10-CM | POA: Diagnosis not present

## 2021-01-20 DIAGNOSIS — G4700 Insomnia, unspecified: Secondary | ICD-10-CM | POA: Insufficient documentation

## 2021-01-20 DIAGNOSIS — F32A Depression, unspecified: Secondary | ICD-10-CM | POA: Diagnosis not present

## 2021-01-20 DIAGNOSIS — G8929 Other chronic pain: Secondary | ICD-10-CM | POA: Diagnosis not present

## 2021-01-20 DIAGNOSIS — M19049 Primary osteoarthritis, unspecified hand: Secondary | ICD-10-CM | POA: Diagnosis not present

## 2021-01-20 DIAGNOSIS — M960 Pseudarthrosis after fusion or arthrodesis: Secondary | ICD-10-CM | POA: Diagnosis not present

## 2021-01-20 MED ORDER — GABAPENTIN 800 MG PO TABS: 800.0000 mg | ORAL_TABLET | Freq: Three times a day (TID) | ORAL | 3 refills | Status: DC

## 2021-01-20 MED ORDER — OXYCODONE HCL 15 MG PO TABS: 15.0000 mg | ORAL_TABLET | Freq: Every day | ORAL | 0 refills | Status: DC | PRN

## 2021-01-20 MED ORDER — XTAMPZA ER 18 MG PO C12A: 1.0000 | EXTENDED_RELEASE_CAPSULE | Freq: Two times a day (BID) | ORAL | 0 refills | Status: DC

## 2021-01-20 MED ORDER — TOPIRAMATE 25 MG PO TABS: 25.0000 mg | ORAL_TABLET | Freq: Two times a day (BID) | ORAL | 2 refills | Status: DC

## 2021-01-20 NOTE — Progress Notes (Signed)
Subjective:    Patient ID: [name] [name], female    DOB: Mar 06, 1951, 70 y.o.   MRN: [id number]  HPI: [name] [name] is a 70 y.o. female who returns for follow up appointment for chronic pain and medication refill. She states her pain is located in her neck radiating into her bilateral shoulders and mid- lower back pain radiating into her bilateral lower extremities. She rates her pain 7. Her current exercise regime is walking, gardening and performing stretching exercises.  [name] states she was in her garden last week and lost her footing and fell forward. She landed on her knees. She was able to pick herself up, she didn't seek medical attention. Educated on falls prevention, she verbalizes understanding.   [name] Morphine equivalent is 202.50 MME.   [name] is tolerating the slow weaning of Xtampza, we will continue to monitor.   UDS was Performed today.     Pain Inventory Average Pain 7 Pain Right Now 7 My pain is sharp, burning, stabbing, and tingling  In the last 24 hours, has pain interfered with the following? General activity 6 Relation with others 7 Enjoyment of life 7 What TIME of day is your pain at its worst? morning , daytime, evening, and night Sleep (in general) Fair  Pain is worse with: walking, bending, sitting, standing, and some activites Pain improves with: rest, heat, pacing activities, medication, working Relief from Meds: 7   Social History   Socioeconomic History   Marital status: Divorced    Spouse name: Not on file   Number of children: Not on file   Years of education: Not on file   Highest education level: Not on file  Occupational History   Occupation: disable    Employer: DISABLED  Tobacco Use   Smoking status: Never   Smokeless tobacco: Never  Vaping Use   Vaping Use: Never used  Substance and Sexual Activity   Alcohol use: No   Drug use: No   Sexual activity: Not on file    Comment: second hand smoke  Other Topics Concern    Not on file  Social History Narrative   Has one child lives at home with her.    Social Determinants of Health   Financial Resource Strain: Not on file  Food Insecurity: Not on file  Transportation Needs: Not on file  Physical Activity: Not on file  Stress: Not on file  Social Connections: Not on file   Past Surgical History:  Procedure Laterality Date   ABDOMINAL HYSTERECTOMY  1987   W/ Advance DECOMP/DISCECTOMY FUSION N/A 08/09/2012   Procedure: ANTERIOR CERVICAL DECOMPRESSION/DISCECTOMY FUSION 1 LEVEL;  Surgeon: Floyce Stakes, MD;  Location: [facility];  Service: Neurosurgery;  Laterality: N/A;  Cervical four-five  Anterior cervical decompression/diskectomy/fusion   ANTERIOR FUSION CERVICAL SPINE  2007   C5 -6   APPENDECTOMY     APPLICATION OF INTRAOPERATIVE CT SCAN N/A 05/17/2017   Procedure: APPLICATION OF INTRAOPERATIVE CT SCAN;  Surgeon: Erline Levine, MD;  Location: [place];  Service: Neurosurgery;  Laterality: N/A;   BACK SURGERY     x 5   BREAST EXCISIONAL BIOPSY Bilateral    No scar seen    BREAST SURGERY     x 2 biopsies   CARPAL TUNNEL RELEASE  RIGHT - 2001  & DEC 2011 W/ BACK SURG.   CERVICAL DISC SURGERY  2005   C5 - 6   CHOLECYSTECTOMY  1994  DORSAL COMPARTMENT RELEASE Right 04/07/2013   Procedure: RIGHT WRIST STS RELEASE;  Surgeon: Schuyler Amor, MD;  Location: [place];  Service: Orthopedics;  Laterality: Right;   ENDOVENOUS ABLATION SAPHENOUS VEIN W/ LASER Right 05/29/2019   endovenous laser ablation right greater saphenous vein by Gae Gallop MD    EXPLORATION OF INCISION FOR CSF LEAK  DEC 2011  X2   POST [place] Right 04/07/2013   Procedure: RIGHT INDEX AND RIGHT LONG DISTAL INTERPHALANGEAL JOINT FUSIONS;  Surgeon: Schuyler Amor, MD;  Location: [place];  Service: Orthopedics;  Laterality: Right;   FINGER ARTHROPLASTY Right 04/07/2013   Procedure: RIGHT THUMB [place]  ARTHROPLASTY;  Surgeon: Schuyler Amor, MD;  Location: [place];  Service: Orthopedics;  Laterality: Right;   GANGLION CYST EXCISION Right 04/07/2013   Procedure: RIGHT WRIST MASS EXCISION;  Surgeon: Schuyler Amor, MD;  Location: [place];  Service: Orthopedics;  Laterality: Right;   KNEE ARTHROSCOPY  LEFT X3 (LAST ONE 2005)   KNEE ARTHROSCOPY  05/17/2011   Procedure: ARTHROSCOPY KNEE;  Surgeon: Bradley Ferris III;  Location: [place];  Service: Orthopedics;  Laterality: Right;  WITH MEDIAL MENISECTOMY AND removal of suprapatella fat lump   LEFT WRIST TENOSYNECTOMY W/ LEFT THUMB JOINT REPAIR  02-24-10   LUMBAR FUSION  11-30-10   L4 - 5   LUMBAR FUSION  2000   L2 - 4   LUMBAR LAMINECTOMY  DEC 2011   L4 - 5   POSTERIOR CERVICAL FUSION/FORAMINOTOMY N/A 05/17/2017   Procedure: Cervical five  to Thorastic one  Posterior cervical fusion with lateral mass screws/revision of prior instrumentation;  Surgeon: Erline Levine, MD;  Location: [place];  Service: Neurosurgery;  Laterality: N/A;  C5 to T1 Posterior cervical fusion with lateral mass screws/revision of prior instrumentation   TENDON REPAIR  JAN 2010   LEFT INDEX AND LONG FINGERS   THUMB SURGERY   04/07/2019   RIGHT THUMB   TUBAL LIGATION     Past Surgical History:  Procedure Laterality Date   ABDOMINAL HYSTERECTOMY  1987   W/ BSO   ANTERIOR CERVICAL DECOMP/DISCECTOMY FUSION N/A 08/09/2012   Procedure: ANTERIOR CERVICAL DECOMPRESSION/DISCECTOMY FUSION 1 LEVEL;  Surgeon: Floyce Stakes, MD;  Location: [facility];  Service: Neurosurgery;  Laterality: N/A;  Cervical four-five  Anterior cervical decompression/diskectomy/fusion   ANTERIOR FUSION CERVICAL SPINE  2007   C5 -6   APPENDECTOMY     APPLICATION OF INTRAOPERATIVE CT SCAN N/A 05/17/2017   Procedure: APPLICATION OF INTRAOPERATIVE CT SCAN;  Surgeon: Erline Levine, MD;  Location: [place];  Service: Neurosurgery;  Laterality: N/A;    BACK SURGERY     x 5   BREAST EXCISIONAL BIOPSY Bilateral    No scar seen    BREAST SURGERY     x 2 biopsies   CARPAL TUNNEL RELEASE  RIGHT - 2001  & DEC 2011 W/ BACK SURG.   CERVICAL DISC SURGERY  2005   C5 - 6   CHOLECYSTECTOMY  1994   DORSAL COMPARTMENT RELEASE Right 04/07/2013   Procedure: RIGHT WRIST STS RELEASE;  Surgeon: Schuyler Amor, MD;  Location: [place];  Service: Orthopedics;  Laterality: Right;   ENDOVENOUS ABLATION SAPHENOUS VEIN W/ LASER Right 05/29/2019   endovenous laser ablation right greater saphenous vein by Gae Gallop MD    EXPLORATION OF INCISION FOR CSF LEAK  DEC 2011  X2  POST LAMINECOTMY   FINGER ARTHRODESIS Right 04/07/2013   Procedure: RIGHT INDEX AND RIGHT LONG DISTAL INTERPHALANGEAL JOINT FUSIONS;  Surgeon: Schuyler Amor, MD;  Location: [place];  Service: Orthopedics;  Laterality: Right;   FINGER ARTHROPLASTY Right 04/07/2013   Procedure: RIGHT THUMB [place] ARTHROPLASTY;  Surgeon: Schuyler Amor, MD;  Location: [place];  Service: Orthopedics;  Laterality: Right;   GANGLION CYST EXCISION Right 04/07/2013   Procedure: RIGHT WRIST MASS EXCISION;  Surgeon: Schuyler Amor, MD;  Location: [place];  Service: Orthopedics;  Laterality: Right;   KNEE ARTHROSCOPY  LEFT X3 (LAST ONE 2005)   KNEE ARTHROSCOPY  05/17/2011   Procedure: ARTHROSCOPY KNEE;  Surgeon: Bradley Ferris III;  Location: [place];  Service: Orthopedics;  Laterality: Right;  WITH MEDIAL MENISECTOMY AND removal of suprapatella fat lump   LEFT WRIST TENOSYNECTOMY W/ LEFT THUMB JOINT REPAIR  02-24-10   LUMBAR FUSION  11-30-10   L4 - 5   LUMBAR FUSION  2000   L2 - 4   LUMBAR LAMINECTOMY  DEC 2011   L4 - 5   POSTERIOR CERVICAL FUSION/FORAMINOTOMY N/A 05/17/2017   Procedure: Cervical five  to Thorastic one  Posterior cervical fusion with lateral mass screws/revision of prior instrumentation;   Surgeon: Erline Levine, MD;  Location: [place];  Service: Neurosurgery;  Laterality: N/A;  C5 to T1 Posterior cervical fusion with lateral mass screws/revision of prior instrumentation   TENDON REPAIR  JAN 2010   LEFT INDEX AND LONG FINGERS   THUMB SURGERY   04/07/2019   RIGHT THUMB   TUBAL LIGATION     Past Medical History:  Diagnosis Date   Acute sinusitis, unspecified    Allergy    Anemia    "quite a few times"   Anxiety state, unspecified    Arachnoiditis BILATERAL LEGS   DUE TO MULTIPLE BACK SURG.'S   Arthritis    Asthma    last flare up was 03/2017 lasted over a month   Blood transfusion    Cancer ([place])    skin cancers (in scalp)   Cardiomyopathy HX --06/2010   EF was 25% during acute illness (PHELONEPHRITIS) Repeat echo 12-06-10 60% showed normal EF.    Chronic back pain greater than 3 months duration    S/P BACK SURG'S   CSF leak    Diabetes mellitus without complication ([place])    dx 2013 type 2   Dyslipidemia    Dysphagia    some post op cerv fusion 2/14   Dysrhythmia    Essential hypertension, benign    Family history of adverse reaction to anesthesia    mother gets n/v   GERD (gastroesophageal reflux disease) AND HIATIAL HERNIA   CONTROLLED W/ NEXIUM   Headache(784.0)    History of chronic bronchitis    Hx of bladder infections    Hyperlipidemia    TAKE CHLOSTEROL MEDICATION,04/23/19   Hypertension    Neuromuscular disorder (HCC)    numbness and tingling   Osteoporosis    Other malaise and fatigue    PONV (postoperative nausea and vomiting)    Shortness of breath    Spinal headache    Spinal stenosis, cervical region    Varicosities    venous   Weakness of both legs DUE TO ARACHNOIDITIS   OCCASIONAL USES CANE   BP 115/72   Pulse 82   Temp 98.3 F (36.8 C) (Oral)   Ht '5\' 4"'$  (1.626 m)  Wt 163 lb 0.3 oz (73.9 kg)   SpO2 98%   BMI 27.98 kg/m   Opioid Risk Score:   Fall Risk Score:  `1  Depression screen PHQ 2/9  Depression screen [facility] 2/9  12/22/2020 11/22/2020 10/21/2020 09/22/2020 09/22/2020 07/28/2020 05/31/2020  Decreased Interest 1 1 0 0 0 0 0  Down, Depressed, Hopeless 1 1 0 0 0 0 0  PHQ - 2 Score 2 2 0 0 0 0 0  Altered sleeping - 1 - - - - -  Tired, decreased energy - - - - - - -  Change in appetite - - - - - - -  Feeling bad or failure about yourself  - - - - - - -  Trouble concentrating - - - - - - -  Moving slowly or fidgety/restless - - - - - - -  Suicidal thoughts - - - - - - -  PHQ-9 Score - 3 - - - - -  Some recent data might be hidden     Review of Systems  Musculoskeletal:  Positive for back pain and neck pain.       Bilateral leg pain   All other systems reviewed and are negative.     Objective:   Physical Exam Vitals and nursing note reviewed.  Constitutional:      Appearance: Normal appearance.  Neck:     Comments: Cervical Paraspinal Tenderness: C-5-C-6 Cardiovascular:     Rate and Rhythm: Normal rate and regular rhythm.     Pulses: Normal pulses.     Heart sounds: Normal heart sounds.  Pulmonary:     Effort: Pulmonary effort is normal.     Breath sounds: Normal breath sounds.  Musculoskeletal:     Cervical back: Normal range of motion and neck supple.     Comments: Normal Muscle Bulk and Muscle Testing Reveals:  Upper Extremities: Full ROM and Muscle Strength 5/5 Bilateral AC Joint Tenderness Thoracic Paraspinal Tenderness: T-7-T-9 Lumbar Paraspinal Tenderness: L-4-L-5 Lower Extremities: Full ROM and Muscle Strength 5/5 Arises from Table with ease Narrow Based  Gait     Skin:    General: Skin is warm and dry.  Neurological:     Mental Status: She is alert and oriented to person, place, and time.  Psychiatric:        Mood and Affect: Mood normal.        Behavior: Behavior normal.         Assessment & Plan:  1. On 05/17/2017 :C5C6C7 T1 Posterior Cervical Fusion with lateral mass fixation with AIRO Imaging, revision of prior instrumentation. By Dr. Vertell Limber.  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 Continue current medication regimen with  Gabapentin 800 mg BID. [place]: Oxycodone '15mg'$  one tablet 5 times a day  as needed for pain # 150 tablets and Continue with slow weaning of Xtampza: Xtampza 18 mg every 12 hours  #60. 01/20/2021 We will continue the opioid monitoring program, this consists of regular clinic visits, examinations, urine drug screen, pill counts as well as use of [place] Controlled Substance Reporting system. 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 Continue to Monitor. 12/28/2020 3. Anxiety/depression: PCP Following. Continue to monitor. 01/20/2021 4. Muscle Spasms: Continue current medication regime with Flexeril. Continue to Monitor. 01/20/2021 5. Cervicalgia/ Cervical Radiculitis: Dr Vertell Limber Following. ,Continue current medication regime with  Gabapentin:  S/P  C5C6C7 T1 Posterior Cervical Fusion with lateral mass fixation with AIRO Imaging, revision of prior instrumentation by Dr. Vertell Limber on 05/17/2017. 01/20/2021 7. Bilateral Thoracic Back Pain: Continue HEP as Tolerated and Continue current medication regimen. Continue to Monitor. 01/20/2021  8. Left lower extremity DVT/ Phlebitis:  S/P endovenous laser ablation of left great saphenous vein on 05/09/2018 by Dr. Scot Dock:  Vascular  Following. 01/20/2021.  9. Insomnia: Continue Pamelor. Continue to Monitor. 01/20/2021. 10. Left Knee Pain: No complaints today. Ortho Following. 01/20/2021 11. Fall at Home: Educated on [employer], she verbalizes understanding.       F/U in 1 month

## 2021-01-24 ENCOUNTER — Other Ambulatory Visit: Payer: Self-pay | Admitting: *Deleted

## 2021-01-24 DIAGNOSIS — R42 Dizziness and giddiness: Secondary | ICD-10-CM

## 2021-01-24 DIAGNOSIS — M79662 Pain in left lower leg: Secondary | ICD-10-CM

## 2021-01-25 ENCOUNTER — Telehealth: Payer: Self-pay | Admitting: *Deleted

## 2021-01-25 LAB — TOXASSURE SELECT,+ANTIDEPR,UR

## 2021-01-25 NOTE — Telephone Encounter (Signed)
Urine drug screen for this encounter is consistent for prescribed medication 

## 2021-02-09 ENCOUNTER — Encounter (HOSPITAL_COMMUNITY): Payer: Medicare Other

## 2021-02-09 ENCOUNTER — Ambulatory Visit: Payer: Medicare Other | Admitting: Vascular Surgery

## 2021-02-15 DIAGNOSIS — H524 Presbyopia: Secondary | ICD-10-CM | POA: Diagnosis not present

## 2021-02-15 DIAGNOSIS — D3132 Benign neoplasm of left choroid: Secondary | ICD-10-CM | POA: Diagnosis not present

## 2021-02-15 DIAGNOSIS — E119 Type 2 diabetes mellitus without complications: Secondary | ICD-10-CM | POA: Diagnosis not present

## 2021-02-15 DIAGNOSIS — H5202 Hypermetropia, left eye: Secondary | ICD-10-CM | POA: Diagnosis not present

## 2021-02-15 DIAGNOSIS — H25813 Combined forms of age-related cataract, bilateral: Secondary | ICD-10-CM | POA: Diagnosis not present

## 2021-02-18 ENCOUNTER — Encounter: Payer: Self-pay | Admitting: Registered Nurse

## 2021-02-18 ENCOUNTER — Other Ambulatory Visit: Payer: Self-pay

## 2021-02-18 ENCOUNTER — Encounter: Payer: Medicare Other | Attending: Physical Medicine & Rehabilitation | Admitting: Registered Nurse

## 2021-02-18 VITALS — BP 143/73 | HR 81 | Temp 98.9°F | Ht 64.0 in | Wt 158.6 lb

## 2021-02-18 DIAGNOSIS — M5412 Radiculopathy, cervical region: Secondary | ICD-10-CM | POA: Insufficient documentation

## 2021-02-18 DIAGNOSIS — M542 Cervicalgia: Secondary | ICD-10-CM | POA: Diagnosis not present

## 2021-02-18 DIAGNOSIS — G8929 Other chronic pain: Secondary | ICD-10-CM | POA: Diagnosis not present

## 2021-02-18 DIAGNOSIS — G039 Meningitis, unspecified: Secondary | ICD-10-CM | POA: Insufficient documentation

## 2021-02-18 DIAGNOSIS — M961 Postlaminectomy syndrome, not elsewhere classified: Secondary | ICD-10-CM | POA: Insufficient documentation

## 2021-02-18 DIAGNOSIS — G894 Chronic pain syndrome: Secondary | ICD-10-CM | POA: Diagnosis not present

## 2021-02-18 DIAGNOSIS — M546 Pain in thoracic spine: Secondary | ICD-10-CM | POA: Diagnosis not present

## 2021-02-18 DIAGNOSIS — M5416 Radiculopathy, lumbar region: Secondary | ICD-10-CM | POA: Insufficient documentation

## 2021-02-18 DIAGNOSIS — Z5181 Encounter for therapeutic drug level monitoring: Secondary | ICD-10-CM | POA: Diagnosis not present

## 2021-02-18 DIAGNOSIS — Z79891 Long term (current) use of opiate analgesic: Secondary | ICD-10-CM | POA: Diagnosis not present

## 2021-02-18 MED ORDER — NORTRIPTYLINE HCL 25 MG PO CAPS: 25.0000 mg | ORAL_CAPSULE | Freq: Every day | ORAL | 3 refills | Status: DC

## 2021-02-18 MED ORDER — XTAMPZA ER 13.5 MG PO C12A: 1.0000 | EXTENDED_RELEASE_CAPSULE | Freq: Two times a day (BID) | ORAL | 0 refills | Status: DC

## 2021-02-18 MED ORDER — OXYCODONE HCL 15 MG PO TABS: 15.0000 mg | ORAL_TABLET | Freq: Every day | ORAL | 0 refills | Status: DC | PRN

## 2021-02-18 NOTE — Progress Notes (Signed)
Subjective:    Patient ID: [name] [name], female    DOB: [birth year], 70 y.o.   MRN: [id number]  HPI: [name] [name] is a 70 y.o. female who returns for follow up appointment for chronic pain and medication refill. She states her pain is located in her neck radiating into her bilateral shoulders, mid- lower back pain radiating into her right lower extremity. She rates her pain 7.Her  current exercise regime is walking and performing stretching exercises.  [name] [name] is 172.50 MME, [name] [name] the slow weaning of Xtampza.   Last UDS was Performed on 01/20/2021, it was consistent.   Pain Inventory Average Pain 7 Pain Right Now 7 My pain is constant, sharp, burning, stabbing, and tingling  In the last 24 hours, has pain interfered with the following? General activity 7 Relation with others 7 Enjoyment of life 7 What TIME of day is your pain at its worst? morning , daytime, evening, and night Sleep (in general) Fair  Pain is worse with: walking, bending, sitting, inactivity, standing, and some activites Pain improves with: rest, pacing activities, medication, and heat Relief from Meds: 7  Family History  Problem Relation Age of Onset   Hypertension Mother    Heart disease Mother        Mitral valve replacement.    Diabetes Mother    Cancer Father    Diabetes Brother    Colon cancer Neg Hx    Colon polyps Neg Hx    Esophageal cancer Neg Hx    Rectal cancer Neg Hx    Stomach cancer Neg Hx    Social History   Socioeconomic History   Marital status: Divorced    Spouse name: Not on file   Number of children: Not on file   Years of education: Not on file   Highest education level: Not on file  Occupational History   Occupation: disable    Employer: DISABLED  Tobacco Use   Smoking status: Never   Smokeless tobacco: Never  Vaping Use   Vaping Use: Never used  Substance and Sexual Activity   Alcohol use: No   Drug use: No   Sexual activity:  Not on file    Comment: second hand smoke  Other Topics Concern   Not on file  Social History Narrative   Has one child lives at home with her.    Social Determinants of Health   Financial Resource Strain: Not on file  Food Insecurity: Not on file  Transportation Needs: Not on file  Physical Activity: Not on file  Stress: Not on file  Social Connections: Not on file   Past Surgical History:  Procedure Laterality Date   ABDOMINAL HYSTERECTOMY  1987   W/ [place] DECOMP/DISCECTOMY FUSION N/A 08/09/2012   Procedure: ANTERIOR CERVICAL DECOMPRESSION/DISCECTOMY FUSION 1 LEVEL;  Surgeon: Floyce Stakes, MD;  Location: [facility];  Service: Neurosurgery;  Laterality: N/A;  Cervical four-five  Anterior cervical decompression/diskectomy/fusion   ANTERIOR FUSION CERVICAL SPINE  2007   C5 -6   APPENDECTOMY     APPLICATION OF INTRAOPERATIVE CT SCAN N/A 05/17/2017   Procedure: APPLICATION OF INTRAOPERATIVE CT SCAN;  Surgeon: Erline Levine, MD;  Location: [place];  Service: Neurosurgery;  Laterality: N/A;   BACK SURGERY     x 5   BREAST EXCISIONAL BIOPSY Bilateral    No scar seen    BREAST SURGERY     x 2 biopsies  CARPAL TUNNEL RELEASE  RIGHT - 2001  & DEC 2011 W/ BACK SURG.   CERVICAL DISC SURGERY  2005   C5 - 6   CHOLECYSTECTOMY  1994   DORSAL COMPARTMENT RELEASE Right 04/07/2013   Procedure: RIGHT WRIST STS RELEASE;  Surgeon: Schuyler Amor, MD;  Location: [place];  Service: Orthopedics;  Laterality: Right;   ENDOVENOUS ABLATION SAPHENOUS VEIN W/ LASER Right 05/29/2019   endovenous laser ablation right greater saphenous vein by Gae Gallop MD    EXPLORATION OF INCISION FOR CSF LEAK  DEC 2011  X2   POST [place] Right 04/07/2013   Procedure: RIGHT INDEX AND RIGHT LONG DISTAL INTERPHALANGEAL JOINT FUSIONS;  Surgeon: Schuyler Amor, MD;  Location: [place];  Service: Orthopedics;  Laterality: Right;    FINGER ARTHROPLASTY Right 04/07/2013   Procedure: RIGHT THUMB [place] ARTHROPLASTY;  Surgeon: Schuyler Amor, MD;  Location: [place];  Service: Orthopedics;  Laterality: Right;   GANGLION CYST EXCISION Right 04/07/2013   Procedure: RIGHT WRIST MASS EXCISION;  Surgeon: Schuyler Amor, MD;  Location: [place];  Service: Orthopedics;  Laterality: Right;   KNEE ARTHROSCOPY  LEFT X3 (LAST ONE 2005)   KNEE ARTHROSCOPY  05/17/2011   Procedure: ARTHROSCOPY KNEE;  Surgeon: Bradley Ferris III;  Location: [place];  Service: Orthopedics;  Laterality: Right;  WITH MEDIAL MENISECTOMY AND removal of suprapatella fat lump   LEFT WRIST TENOSYNECTOMY W/ LEFT THUMB JOINT REPAIR  02-24-10   LUMBAR FUSION  11-30-10   L4 - 5   LUMBAR FUSION  2000   L2 - 4   LUMBAR LAMINECTOMY  DEC 2011   L4 - 5   POSTERIOR CERVICAL FUSION/FORAMINOTOMY N/A 05/17/2017   Procedure: Cervical five  to Thorastic one  Posterior cervical fusion with lateral mass screws/revision of prior instrumentation;  Surgeon: Erline Levine, MD;  Location: [place];  Service: Neurosurgery;  Laterality: N/A;  C5 to T1 Posterior cervical fusion with lateral mass screws/revision of prior instrumentation   TENDON REPAIR  JAN 2010   LEFT INDEX AND LONG FINGERS   THUMB SURGERY   04/07/2019   RIGHT THUMB   TUBAL LIGATION     Past Surgical History:  Procedure Laterality Date   ABDOMINAL HYSTERECTOMY  1987   W/ [facility]   ANTERIOR CERVICAL DECOMP/DISCECTOMY FUSION N/A 08/09/2012   Procedure: ANTERIOR CERVICAL DECOMPRESSION/DISCECTOMY FUSION 1 LEVEL;  Surgeon: Floyce Stakes, MD;  Location: [facility];  Service: Neurosurgery;  Laterality: N/A;  Cervical four-five  Anterior cervical decompression/diskectomy/fusion   ANTERIOR FUSION CERVICAL SPINE  2007   C5 -6   APPENDECTOMY     APPLICATION OF INTRAOPERATIVE CT SCAN N/A 05/17/2017   Procedure: APPLICATION OF INTRAOPERATIVE CT SCAN;  Surgeon: Erline Levine, MD;  Location: [place];  Service: Neurosurgery;  Laterality: N/A;   BACK SURGERY     x 5   BREAST EXCISIONAL BIOPSY Bilateral    No scar seen    BREAST SURGERY     x 2 biopsies   CARPAL TUNNEL RELEASE  RIGHT - 2001  & DEC 2011 W/ BACK SURG.   CERVICAL DISC SURGERY  2005   C5 - 6   CHOLECYSTECTOMY  1994   DORSAL COMPARTMENT RELEASE Right 04/07/2013   Procedure: RIGHT WRIST STS RELEASE;  Surgeon: Schuyler Amor, MD;  Location: [place];  Service: Orthopedics;  Laterality: Right;   ENDOVENOUS ABLATION SAPHENOUS  VEIN W/ LASER Right 05/29/2019   endovenous laser ablation right greater saphenous vein by Gae Gallop MD    EXPLORATION OF INCISION FOR CSF LEAK  DEC 2011  X2   POST [place] Right 04/07/2013   Procedure: RIGHT INDEX AND RIGHT LONG DISTAL INTERPHALANGEAL JOINT FUSIONS;  Surgeon: Schuyler Amor, MD;  Location: [place];  Service: Orthopedics;  Laterality: Right;   FINGER ARTHROPLASTY Right 04/07/2013   Procedure: RIGHT THUMB [place] ARTHROPLASTY;  Surgeon: Schuyler Amor, MD;  Location: [place];  Service: Orthopedics;  Laterality: Right;   GANGLION CYST EXCISION Right 04/07/2013   Procedure: RIGHT WRIST MASS EXCISION;  Surgeon: Schuyler Amor, MD;  Location: [place];  Service: Orthopedics;  Laterality: Right;   KNEE ARTHROSCOPY  LEFT X3 (LAST ONE 2005)   KNEE ARTHROSCOPY  05/17/2011   Procedure: ARTHROSCOPY KNEE;  Surgeon: Bradley Ferris III;  Location: [place];  Service: Orthopedics;  Laterality: Right;  WITH MEDIAL MENISECTOMY AND removal of suprapatella fat lump   LEFT WRIST TENOSYNECTOMY W/ LEFT THUMB JOINT REPAIR  02-24-10   LUMBAR FUSION  11-30-10   L4 - 5   LUMBAR FUSION  2000   L2 - 4   LUMBAR LAMINECTOMY  DEC 2011   L4 - 5   POSTERIOR CERVICAL FUSION/FORAMINOTOMY N/A 05/17/2017   Procedure: Cervical five  to Thorastic one  Posterior  cervical fusion with lateral mass screws/revision of prior instrumentation;  Surgeon: Erline Levine, MD;  Location: [place];  Service: Neurosurgery;  Laterality: N/A;  C5 to T1 Posterior cervical fusion with lateral mass screws/revision of prior instrumentation   TENDON REPAIR  JAN 2010   LEFT INDEX AND LONG FINGERS   THUMB SURGERY   04/07/2019   RIGHT THUMB   TUBAL LIGATION     Past Medical History:  Diagnosis Date   Acute sinusitis, unspecified    Allergy    Anemia    "quite a few times"   Anxiety state, unspecified    Arachnoiditis BILATERAL LEGS   DUE TO MULTIPLE BACK SURG.'S   Arthritis    Asthma    last flare up was 03/2017 lasted over a month   Blood transfusion    Cancer ([place])    skin cancers (in scalp)   Cardiomyopathy HX --06/2010   EF was 25% during acute illness (PHELONEPHRITIS) Repeat echo 12-06-10 60% showed normal EF.    Chronic back pain greater than 3 months duration    S/P BACK SURG'S   CSF leak    Diabetes mellitus without complication ([place])    dx 2013 type 2   Dyslipidemia    Dysphagia    some post op cerv fusion 2/14   Dysrhythmia    Essential hypertension, benign    Family history of adverse reaction to anesthesia    mother gets n/v   GERD (gastroesophageal reflux disease) AND HIATIAL HERNIA   CONTROLLED W/ NEXIUM   Headache(784.0)    History of chronic bronchitis    Hx of bladder infections    Hyperlipidemia    TAKE CHLOSTEROL MEDICATION,04/23/19   Hypertension    Neuromuscular disorder (HCC)    numbness and tingling   Osteoporosis    Other malaise and fatigue    PONV (postoperative nausea and vomiting)    Shortness of breath    Spinal headache    Spinal stenosis, cervical region    Varicosities    venous   Weakness  of both legs DUE TO ARACHNOIDITIS   OCCASIONAL USES CANE   BP (!) 143/73   Pulse 81   Temp 98.9 F (37.2 C)   Ht '5\' 4"'$  (1.626 m)   Wt 158 lb 9.6 oz (71.9 kg)   SpO2 98%   BMI 27.22 kg/m   Opioid Risk Score:   Fall  Risk Score:  `1  Depression screen PHQ 2/9  Depression screen [facility] 2/9 01/20/2021 12/22/2020 11/22/2020 10/21/2020 09/22/2020 09/22/2020 07/28/2020  Decreased Interest 0 1 1 0 0 0 0  Down, Depressed, Hopeless 0 1 1 0 0 0 0  PHQ - 2 Score 0 2 2 0 0 0 0  Altered sleeping - - 1 - - - -  Tired, decreased energy - - - - - - -  Change in appetite - - - - - - -  Feeling bad or failure about yourself  - - - - - - -  Trouble concentrating - - - - - - -  Moving slowly or fidgety/restless - - - - - - -  Suicidal thoughts - - - - - - -  PHQ-9 Score - - 3 - - - -  Some recent data might be hidden    Review of Systems  Musculoskeletal:  Positive for back pain and neck pain. Negative for gait problem.       Pain in shoulders, pain in legs  All other systems reviewed and are negative.     Objective:   Physical Exam Vitals and nursing note reviewed.  Constitutional:      Appearance: Normal appearance.  Neck:     Comments: Cervical Paraspinal Tenderness: C-5-C-6 Cardiovascular:     Rate and Rhythm: Normal rate and regular rhythm.     Pulses: Normal pulses.     Heart sounds: Normal heart sounds.  Pulmonary:     Effort: Pulmonary effort is normal.     Breath sounds: Normal breath sounds.  Musculoskeletal:     Cervical back: Normal range of motion and neck supple.     Comments: Normal Muscle Bulk and Muscle Testing Reveals:  Upper Extremities: Full ROM and Muscle Strength 5/5  Thoracic Paraspinal Tenderness: T-7-T-9 Lumbar Paraspinal Tenderness: L-4-L-5 Right Greater Trochanter Tenderness Lower Extremities: Full ROM and Muscle Strength 5/5 Arises from Table with ease Narrow Based  Gait     Skin:    General: Skin is warm and dry.  Neurological:     Mental Status: She is alert and oriented to person, place, and time.  Psychiatric:        Mood and Affect: Mood normal.        Behavior: Behavior normal.         Assessment & Plan:  1. On 05/17/2017 :C5C6C7 T1 Posterior Cervical Fusion with  lateral mass fixation with AIRO Imaging, revision of prior instrumentation. By Dr. Vertell Limber.  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 Continue current medication regimen with  Gabapentin 800 mg BID. [place]: Oxycodone '15mg'$  one tablet 5 times a day  as needed for pain # 150 tablets and Continue with slow weaning of Xtampza: Xtampza 13.5 mg every 12 hours  #60. 02/18/2021 We will continue the opioid monitoring program, this consists of regular clinic visits, examinations, urine drug screen, pill counts as well as use of [place] Controlled Substance Reporting system. 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 Continue to Monitor. 02/18/2021 3. Anxiety/depression: PCP Following. Continue to monitor. 02/18/2021 4. Muscle Spasms: Continue current medication regime with Flexeril. Continue to Monitor. 02/18/2021 5. Cervicalgia/ Cervical Radiculitis: Dr Vertell Limber Following. ,Continue current medication regime with Gabapentin:  S/P  C5C6C7 T1 Posterior Cervical Fusion with lateral mass fixation with AIRO Imaging, revision of prior instrumentation by Dr. Vertell Limber on 05/17/2017. 02/18/2021 7. Bilateral Thoracic Back Pain: Continue HEP as Tolerated and Continue current medication regimen. Continue to Monitor. 02/18/2021  8. Left lower extremity DVT/ Phlebitis:  S/P endovenous laser ablation of left great saphenous vein on 05/09/2018 by Dr. Scot Dock:  Vascular  Following. 02/18/2021. 9. Insomnia: Continue Pamelor. Continue to Monitor. 02/18/2021. 10. Left Knee Pain: No complaints today. Ortho Following. 02/18/2021    F/U in 1 month

## 2021-02-24 NOTE — Telephone Encounter (Signed)
error 

## 2021-02-28 DIAGNOSIS — H25811 Combined forms of age-related cataract, right eye: Secondary | ICD-10-CM | POA: Diagnosis not present

## 2021-02-28 DIAGNOSIS — Z01818 Encounter for other preprocedural examination: Secondary | ICD-10-CM | POA: Diagnosis not present

## 2021-02-28 DIAGNOSIS — H25812 Combined forms of age-related cataract, left eye: Secondary | ICD-10-CM | POA: Diagnosis not present

## 2021-02-28 DIAGNOSIS — H02831 Dermatochalasis of right upper eyelid: Secondary | ICD-10-CM | POA: Diagnosis not present

## 2021-03-03 ENCOUNTER — Ambulatory Visit: Payer: Medicare Other

## 2021-03-07 DIAGNOSIS — H25811 Combined forms of age-related cataract, right eye: Secondary | ICD-10-CM | POA: Diagnosis not present

## 2021-03-07 DIAGNOSIS — H2511 Age-related nuclear cataract, right eye: Secondary | ICD-10-CM | POA: Diagnosis not present

## 2021-03-08 DIAGNOSIS — I10 Essential (primary) hypertension: Secondary | ICD-10-CM | POA: Diagnosis not present

## 2021-03-08 DIAGNOSIS — Z1331 Encounter for screening for depression: Secondary | ICD-10-CM | POA: Diagnosis not present

## 2021-03-08 DIAGNOSIS — E782 Mixed hyperlipidemia: Secondary | ICD-10-CM | POA: Diagnosis not present

## 2021-03-08 DIAGNOSIS — E1143 Type 2 diabetes mellitus with diabetic autonomic (poly)neuropathy: Secondary | ICD-10-CM | POA: Diagnosis not present

## 2021-03-08 DIAGNOSIS — Z0000 Encounter for general adult medical examination without abnormal findings: Secondary | ICD-10-CM | POA: Diagnosis not present

## 2021-03-14 DIAGNOSIS — H2512 Age-related nuclear cataract, left eye: Secondary | ICD-10-CM | POA: Diagnosis not present

## 2021-03-14 DIAGNOSIS — H25812 Combined forms of age-related cataract, left eye: Secondary | ICD-10-CM | POA: Diagnosis not present

## 2021-03-15 ENCOUNTER — Other Ambulatory Visit: Payer: Self-pay

## 2021-03-15 ENCOUNTER — Encounter: Payer: Medicare Other | Admitting: Registered Nurse

## 2021-03-21 ENCOUNTER — Telehealth: Payer: Self-pay

## 2021-03-21 MED ORDER — XTAMPZA ER 9 MG PO C12A: 1.0000 | EXTENDED_RELEASE_CAPSULE | Freq: Two times a day (BID) | ORAL | 0 refills | Status: DC

## 2021-03-21 MED ORDER — OXYCODONE HCL 15 MG PO TABS: 15.0000 mg | ORAL_TABLET | Freq: Every day | ORAL | 0 refills | Status: DC | PRN

## 2021-03-21 NOTE — Addendum Note (Signed)
Addended by: Bayard Hugger on: 03/21/2021 01:39 PM   Modules accepted: Orders

## 2021-03-21 NOTE — Telephone Encounter (Signed)
PMP was Reviewed. Continue with slow weaning of Xtampza, [name] [name] tolerating the slow weaning of Xtampza.

## 2021-03-21 NOTE — Telephone Encounter (Signed)
Patient called for refill for Oxycodone & Xtampza. She will be out of her pain medication today. (FYI, I have no PMP access today).

## 2021-03-24 ENCOUNTER — Encounter: Payer: Self-pay | Admitting: Vascular Surgery

## 2021-03-24 ENCOUNTER — Ambulatory Visit (INDEPENDENT_AMBULATORY_CARE_PROVIDER_SITE_OTHER): Payer: Medicare Other | Admitting: Vascular Surgery

## 2021-03-24 ENCOUNTER — Ambulatory Visit (HOSPITAL_COMMUNITY)
Admission: RE | Admit: 2021-03-24 | Discharge: 2021-03-24 | Disposition: A | Discharge status: Home / Self Care | Payer: Medicare Other | Source: Ambulatory Visit | Attending: Vascular Surgery | Admitting: Vascular Surgery

## 2021-03-24 ENCOUNTER — Other Ambulatory Visit: Payer: Self-pay

## 2021-03-24 VITALS — BP 117/81 | HR 77 | Temp 98.1°F | Resp 20 | Ht 64.0 in | Wt 157.0 lb

## 2021-03-24 DIAGNOSIS — M19049 Primary osteoarthritis, unspecified hand: Secondary | ICD-10-CM | POA: Diagnosis not present

## 2021-03-24 DIAGNOSIS — I872 Venous insufficiency (chronic) (peripheral): Secondary | ICD-10-CM

## 2021-03-24 DIAGNOSIS — M180 Bilateral primary osteoarthritis of first carpometacarpal joints: Secondary | ICD-10-CM | POA: Diagnosis not present

## 2021-03-24 DIAGNOSIS — R42 Dizziness and giddiness: Secondary | ICD-10-CM | POA: Insufficient documentation

## 2021-03-24 NOTE — Progress Notes (Signed)
REASON FOR VISIT:   For carotid duplex scan today.  MEDICAL ISSUES:   CAROTID EVALUATION: I had previously been following this patient with chronic venous insufficiency.  She had some problems with dizziness when I saw her last and had 1 episode of visual disturbance in both eyes.  She was very concerned about her carotid so we arrange for carotid duplex scan.  Today, I reassured her that her carotid duplex scan shows no evidence of significant carotid disease and no evidence of significant vertebrobasilar disease.  No further work-up is indicated unless she develops new neurologic symptoms.  CHRONIC VENOUS INSUFFICIENCY: She has developed some recurrent swelling in the left leg.  Based on her previous duplex she does have deep venous reflux in the left involving the common femoral vein and popliteal vein.  She is scheduled for a follow-up duplex of the left lower extremity in 2 weeks she tells me.  I will see her back at that time.  We have discussed the importance of elevation and compression therapy.   HPI:   [name] [name] is a pleasant 70 y.o. female who I been following with chronic venous insufficiency.  She has undergone previous left great saphenous vein laser ablation and subsequent right great saphenous vein laser ablation.  When I saw her last she was complaining of some dizziness.  She also had some mild bilateral visual disturbance.  She was very concerned about her carotids and we arrange for a carotid duplex scan.  Since I saw her last, she denies any history of stroke, TIAs, expressive or receptive aphasia, or amaurosis fugax.  Her dizziness has resolved.  Her main complaint today is some swelling in the left leg which is come on gradually.  She has no significant pain associated with this.  Past Medical History:  Diagnosis Date   Acute sinusitis, unspecified    Allergy    Anemia    "quite a few times"   Anxiety state, unspecified    Arachnoiditis BILATERAL LEGS   DUE TO  MULTIPLE BACK SURG.'S   Arthritis    Asthma    last flare up was 03/2017 lasted over a month   Blood transfusion    Cancer ([place])    skin cancers (in scalp)   Cardiomyopathy HX --06/2010   EF was 25% during acute illness (PHELONEPHRITIS) Repeat echo 12-06-10 60% showed normal EF.    Chronic back pain greater than 3 months duration    S/P BACK SURG'S   CSF leak    Diabetes mellitus without complication ([place])    dx 2013 type 2   Dyslipidemia    Dysphagia    some post op cerv fusion 2/14   Dysrhythmia    Essential hypertension, benign    Family history of adverse reaction to anesthesia    mother gets n/v   GERD (gastroesophageal reflux disease) AND HIATIAL HERNIA   CONTROLLED W/ NEXIUM   Headache(784.0)    History of chronic bronchitis    Hx of bladder infections    Hyperlipidemia    TAKE CHLOSTEROL MEDICATION,04/23/19   Hypertension    Neuromuscular disorder (HCC)    numbness and tingling   Osteoporosis    Other malaise and fatigue    PONV (postoperative nausea and vomiting)    Shortness of breath    Spinal headache    Spinal stenosis, cervical region    Varicosities    venous   Weakness of both legs DUE TO ARACHNOIDITIS   OCCASIONAL USES  CANE    Family History  Problem Relation Age of Onset   Hypertension Mother    Heart disease Mother        Mitral valve replacement.    Diabetes Mother    Cancer Father    Diabetes Brother    Colon cancer Neg Hx    Colon polyps Neg Hx    Esophageal cancer Neg Hx    Rectal cancer Neg Hx    Stomach cancer Neg Hx     SOCIAL HISTORY: Social History   Tobacco Use   Smoking status: Never   Smokeless tobacco: Never  Substance Use Topics   Alcohol use: No    Allergies  Allergen Reactions   Latex Itching and Rash   Morphine And Related Hives and Itching   Acetaminophen    Morphine    Oxycodone Hcl    Aspirin Nausea And Vomiting    Can take coated asa   Biaxin [Clarithromycin] Nausea And Vomiting   Clarithromycin  Diarrhea   Codeine Itching   Darvocet [Propoxyphene N-Acetaminophen] Itching   Hydrocodone-Acetaminophen Itching   Lortab [Hydrocodone-Acetaminophen] Itching   Percocet [Oxycodone-Acetaminophen] Itching   Tramadol Nausea Only    Current Outpatient Medications  Medication Sig Dispense Refill   albuterol (VENTOLIN HFA) 108 (90 Base) MCG/ACT inhaler INHALE 2 PUFFS EVERY 6 HOURS AS NEEDED FOR WHEEZING/ SHORTNESS OF BREATH 8.5 g 4   aspirin EC 81 MG tablet Take 81 mg by mouth at bedtime.      budesonide-formoterol (SYMBICORT) 160-4.5 MCG/ACT inhaler Inhale 2 puffs into the lungs 2 (two) times daily. 1 Inhaler 5   Calcium Carbonate-Vit D-Min 1200-1000 MG-UNIT CHEW Chew 1 tablet by mouth daily.     diltiazem (CARDIZEM CD) 240 MG 24 hr capsule Take 240 mg by mouth daily.     estradiol (ESTRACE) 2 MG tablet Take 2 mg by mouth daily.      gabapentin (NEURONTIN) 800 MG tablet Take 1 tablet (800 mg total) by mouth 3 (three) times daily. 90 tablet 3   levalbuterol (XOPENEX HFA) 45 MCG/ACT inhaler Inhale 1-2 puffs into the lungs every 4 (four) hours as needed. For shortness of breath 1 Inhaler 6   losartan-hydrochlorothiazide (HYZAAR) 50-12.5 MG tablet Take 1 tablet by mouth daily.     metFORMIN (GLUCOPHAGE) 500 MG tablet Take 500 mg by mouth 2 (two) times daily with a meal.      montelukast (SINGULAIR) 10 MG tablet Take 10 mg by mouth at bedtime. Pt takes in the morning.     naloxone (NARCAN) nasal spray 4 mg/0.1 mL Place 1 spray into the nose once as needed (Just in case of Opiod Overdose).     nortriptyline (PAMELOR) 25 MG capsule Take 1 capsule (25 mg total) by mouth at bedtime. 30 capsule 3   omeprazole (PRILOSEC) 40 MG capsule Take 40 mg by mouth daily.     oxyCODONE (ROXICODONE) 15 MG immediate release tablet Take 1 tablet (15 mg total) by mouth 5 (five) times daily as needed for pain. 150 tablet 0   oxyCODONE ER (XTAMPZA ER) 9 MG C12A Take 1 capsule by mouth every 12 (twelve) hours. 60 capsule 0    promethazine (PHENERGAN) 25 MG tablet Take 25 mg by mouth as needed.      simvastatin (ZOCOR) 10 MG tablet Take 10 mg by mouth daily.     topiramate (TOPAMAX) 25 MG tablet Take 1 tablet (25 mg total) by mouth 2 (two) times daily. 60 tablet 2   TRUE  METRIX BLOOD GLUCOSE TEST test strip daily.     valACYclovir (VALTREX) 500 MG tablet Take 500 mg by mouth daily.      No current facility-administered medications for this visit.   Facility-Administered Medications Ordered in Other Visits  Medication Dose Route Frequency Provider Last Rate Last Admin   levalbuterol (XOPENEX) nebulizer solution 0.63 mg  0.63 mg Nebulization Once Parrett, Tammy S, NP        REVIEW OF SYSTEMS:  [X]  denotes positive finding, [ ]  denotes negative finding Cardiac  Comments:  Chest pain or chest pressure:    Shortness of breath upon exertion:    Short of breath when lying flat:    Irregular heart rhythm:        Vascular    Pain in calf, thigh, or hip brought on by ambulation:    Pain in feet at night that wakes you up from your sleep:     Blood clot in your veins:    Leg swelling:  x Left leg      Pulmonary    Oxygen at home:    Productive cough:     Wheezing:         Neurologic    Sudden weakness in arms or legs:     Sudden numbness in arms or legs:     Sudden onset of difficulty speaking or slurred speech:    Temporary loss of vision in one eye:     Problems with dizziness:         Gastrointestinal    Blood in stool:     Vomited blood:         Genitourinary    Burning when urinating:     Blood in urine:        Psychiatric    Major depression:         Hematologic    Bleeding problems:    Problems with blood clotting too easily:        Skin    Rashes or ulcers:        Constitutional    Fever or chills:     PHYSICAL EXAM:   Vitals:   03/24/21 1359 03/24/21 1405  BP: 122/71 117/81  Pulse: 77   Resp: 20   Temp: 98.1 F (36.7 C)   SpO2: 96%   Weight: 157 lb (71.2 kg)   Height:  5\' 4"  (1.626 m)     GENERAL: The patient is a well-nourished female, in no acute distress. The vital signs are documented above. CARDIAC: There is a regular rate and rhythm.  VASCULAR: I do not detect carotid bruits. She has palpable pedal pulses. She has mild swelling of the left calf. PULMONARY: There is good air exchange bilaterally without wheezing or rales. ABDOMEN: Soft and non-tender with normal pitched bowel sounds.  MUSCULOSKELETAL: There are no major deformities or cyanosis. NEUROLOGIC: No focal weakness or paresthesias are detected. SKIN: There are no ulcers or rashes noted. PSYCHIATRIC: The patient has a normal affect.  DATA:    CAROTID DUPLEX: I have independently interpreted her carotid duplex scan today.  On the right side there is a less than 39% stenosis.  The right vertebral artery is patent with antegrade flow.  On the left side there is a less than 39% stenosis.  The left vertebral artery is patent with antegrade flow.  Deitra Mayo [facility] [phone]

## 2021-03-25 DIAGNOSIS — R109 Unspecified abdominal pain: Secondary | ICD-10-CM | POA: Diagnosis not present

## 2021-03-25 DIAGNOSIS — R1084 Generalized abdominal pain: Secondary | ICD-10-CM | POA: Diagnosis not present

## 2021-03-31 ENCOUNTER — Telehealth: Payer: Self-pay | Admitting: Internal Medicine

## 2021-03-31 MED ORDER — ALBUTEROL SULFATE (2.5 MG/3ML) 0.083% IN NEBU: 2.5000 mg | INHALATION_SOLUTION | Freq: Four times a day (QID) | RESPIRATORY_TRACT | 12 refills | Status: DC | PRN

## 2021-03-31 NOTE — Telephone Encounter (Signed)
Called and spoke with patient, advised I would send in a script for the albuterol to her pharmacy.  Verified her pharmacy and script sent.  Advised to call back if the nebulizer did not help.  Nothing further needed.

## 2021-04-04 DIAGNOSIS — R251 Tremor, unspecified: Secondary | ICD-10-CM | POA: Diagnosis not present

## 2021-04-05 ENCOUNTER — Telehealth: Payer: Self-pay | Admitting: Internal Medicine

## 2021-04-05 MED ORDER — ALBUTEROL SULFATE (2.5 MG/3ML) 0.083% IN NEBU: 2.5000 mg | INHALATION_SOLUTION | Freq: Four times a day (QID) | RESPIRATORY_TRACT | 11 refills | Status: DC | PRN

## 2021-04-05 NOTE — Telephone Encounter (Signed)
Spoke with the pt  She states that she is needing new rx sent for albuterol neb  Her ins denied last we sent  She has medicare, and the dx code was missing  I sent new rx with dx code  Nothing further needed

## 2021-04-06 ENCOUNTER — Ambulatory Visit (HOSPITAL_COMMUNITY)
Admission: RE | Admit: 2021-04-06 | Discharge: 2021-04-06 | Disposition: A | Discharge status: Home / Self Care | Payer: Medicare Other | Source: Ambulatory Visit | Attending: Vascular Surgery | Admitting: Vascular Surgery

## 2021-04-06 ENCOUNTER — Encounter: Payer: Self-pay | Admitting: Vascular Surgery

## 2021-04-06 ENCOUNTER — Ambulatory Visit (INDEPENDENT_AMBULATORY_CARE_PROVIDER_SITE_OTHER): Payer: Medicare Other | Admitting: Vascular Surgery

## 2021-04-06 ENCOUNTER — Other Ambulatory Visit: Payer: Self-pay

## 2021-04-06 VITALS — BP 124/75 | HR 87 | Temp 97.8°F | Resp 14 | Ht 65.0 in | Wt 152.0 lb

## 2021-04-06 DIAGNOSIS — M546 Pain in thoracic spine: Secondary | ICD-10-CM | POA: Diagnosis not present

## 2021-04-06 DIAGNOSIS — Z79891 Long term (current) use of opiate analgesic: Secondary | ICD-10-CM | POA: Diagnosis not present

## 2021-04-06 DIAGNOSIS — M7989 Other specified soft tissue disorders: Secondary | ICD-10-CM | POA: Diagnosis not present

## 2021-04-06 DIAGNOSIS — G8929 Other chronic pain: Secondary | ICD-10-CM | POA: Diagnosis not present

## 2021-04-06 DIAGNOSIS — M79662 Pain in left lower leg: Secondary | ICD-10-CM | POA: Diagnosis not present

## 2021-04-06 DIAGNOSIS — I872 Venous insufficiency (chronic) (peripheral): Secondary | ICD-10-CM

## 2021-04-06 DIAGNOSIS — M5412 Radiculopathy, cervical region: Secondary | ICD-10-CM | POA: Diagnosis not present

## 2021-04-06 DIAGNOSIS — M542 Cervicalgia: Secondary | ICD-10-CM | POA: Diagnosis not present

## 2021-04-06 DIAGNOSIS — M961 Postlaminectomy syndrome, not elsewhere classified: Secondary | ICD-10-CM | POA: Diagnosis not present

## 2021-04-06 DIAGNOSIS — M5416 Radiculopathy, lumbar region: Secondary | ICD-10-CM | POA: Diagnosis not present

## 2021-04-06 DIAGNOSIS — Z5181 Encounter for therapeutic drug level monitoring: Secondary | ICD-10-CM | POA: Diagnosis not present

## 2021-04-06 DIAGNOSIS — G894 Chronic pain syndrome: Secondary | ICD-10-CM | POA: Diagnosis not present

## 2021-04-06 DIAGNOSIS — G039 Meningitis, unspecified: Secondary | ICD-10-CM | POA: Diagnosis not present

## 2021-04-06 DIAGNOSIS — Z1231 Encounter for screening mammogram for malignant neoplasm of breast: Secondary | ICD-10-CM | POA: Diagnosis not present

## 2021-04-06 NOTE — Progress Notes (Signed)
REASON FOR VISIT:   Follow-up of chronic venous insufficiency  MEDICAL ISSUES:   CHRONIC VENOUS INSUFFICIENCY: This patient has CEAP C2 venous disease.  She has appeared to develop some recurrent reflux in the left great saphenous vein.  This had been previously ablated in 2019.  Currently however the vein is not especially dilated.  Therefore I would not consider laser ablation at this point.  We have discussed importance of intermittent leg elevation and the proper positioning for this.  In addition I have encouraged her to continue to wear her compression stockings when she is standing for any prolonged period of time.  We also discussed importance of exercise.  I will plan on seeing her back in 1 year with a follow-up duplex of the left lower extremity.  I did reassure her that she has no evidence of DVT.  I think her pain in her thighs is mostly related to her back.  She will call sooner if she has problems.   HPI:   [name] [name] is a pleasant 70 y.o. female who presents for follow-up of her chronic venous insufficiency.  She is undergone previous laser ablation of the right great saphenous vein and previous to that left great saphenous vein.  She comes in for a routine follow-up visit.  She does have pain in both legs which occurs down the posterior aspect of her thighs and is associated with sitting and standing.  She does have a history of significant issues with her back.  I suspect this pain is related to her back.  She does at times get some aching pain in her lower legs though that could be related to her venous disease.  In addition she has describes some swelling in her left lower extremity more so than the right.  Her laser ablation in the left leg was November 2019.  Her laser ablation on the right was in December 2020.  She denies any recent chest pain or significant shortness of breath.  Past Medical History:  Diagnosis Date   Acute sinusitis, unspecified    Allergy     Anemia    "quite a few times"   Anxiety state, unspecified    Arachnoiditis BILATERAL LEGS   DUE TO MULTIPLE BACK SURG.'S   Arthritis    Asthma    last flare up was 03/2017 lasted over a month   Blood transfusion    Cancer ([place])    skin cancers (in scalp)   Cardiomyopathy HX --06/2010   EF was 25% during acute illness (PHELONEPHRITIS) Repeat echo 12-06-10 60% showed normal EF.    Chronic back pain greater than 3 months duration    S/P BACK SURG'S   CSF leak    Diabetes mellitus without complication ([place])    dx 2013 type 2   Dyslipidemia    Dysphagia    some post op cerv fusion 2/14   Dysrhythmia    Essential hypertension, benign    Family history of adverse reaction to anesthesia    mother gets n/v   GERD (gastroesophageal reflux disease) AND HIATIAL HERNIA   CONTROLLED W/ NEXIUM   Headache(784.0)    History of chronic bronchitis    Hx of bladder infections    Hyperlipidemia    TAKE CHLOSTEROL MEDICATION,04/23/19   Hypertension    Neuromuscular disorder (HCC)    numbness and tingling   Osteoporosis    Other malaise and fatigue    PONV (postoperative nausea and vomiting)  Shortness of breath    Spinal headache    Spinal stenosis, cervical region    Varicosities    venous   Weakness of both legs DUE TO ARACHNOIDITIS   OCCASIONAL USES CANE    Family History  Problem Relation Age of Onset   Hypertension Mother    Heart disease Mother        Mitral valve replacement.    Diabetes Mother    Cancer Father    Diabetes Brother    Colon cancer Neg Hx    Colon polyps Neg Hx    Esophageal cancer Neg Hx    Rectal cancer Neg Hx    Stomach cancer Neg Hx     SOCIAL HISTORY: Social History   Tobacco Use   Smoking status: Never   Smokeless tobacco: Never  Substance Use Topics   Alcohol use: No    Allergies  Allergen Reactions   Latex Itching and Rash   Morphine And Related Hives and Itching   Acetaminophen    Morphine    Oxycodone Hcl    Aspirin Nausea And  Vomiting    Can take coated asa   Biaxin [Clarithromycin] Nausea And Vomiting   Clarithromycin Diarrhea   Codeine Itching   Darvocet [Propoxyphene N-Acetaminophen] Itching   Hydrocodone-Acetaminophen Itching   Lortab [Hydrocodone-Acetaminophen] Itching   Percocet [Oxycodone-Acetaminophen] Itching   Tramadol Nausea Only    Current Outpatient Medications  Medication Sig Dispense Refill   albuterol (PROVENTIL) (2.5 MG/3ML) 0.083% nebulizer solution Take 3 mLs (2.5 mg total) by nebulization every 6 (six) hours as needed for wheezing or shortness of breath. 120 mL 11   albuterol (VENTOLIN HFA) 108 (90 Base) MCG/ACT inhaler INHALE 2 PUFFS EVERY 6 HOURS AS NEEDED FOR WHEEZING/ SHORTNESS OF BREATH 8.5 g 4   aspirin EC 81 MG tablet Take 81 mg by mouth at bedtime.      budesonide-formoterol (SYMBICORT) 160-4.5 MCG/ACT inhaler Inhale 2 puffs into the lungs 2 (two) times daily. 1 Inhaler 5   Calcium Carbonate-Vit D-Min 1200-1000 MG-UNIT CHEW Chew 1 tablet by mouth daily.     diltiazem (CARDIZEM CD) 240 MG 24 hr capsule Take 240 mg by mouth daily.     estradiol (ESTRACE) 2 MG tablet Take 2 mg by mouth daily.      gabapentin (NEURONTIN) 800 MG tablet Take 1 tablet (800 mg total) by mouth 3 (three) times daily. 90 tablet 3   losartan-hydrochlorothiazide (HYZAAR) 50-12.5 MG tablet Take 1 tablet by mouth daily.     metFORMIN (GLUCOPHAGE) 500 MG tablet Take 500 mg by mouth 2 (two) times daily with a meal.      montelukast (SINGULAIR) 10 MG tablet Take 10 mg by mouth at bedtime. Pt takes in the morning.     naloxone (NARCAN) nasal spray 4 mg/0.1 mL Place 1 spray into the nose once as needed (Just in case of Opiod Overdose).     nortriptyline (PAMELOR) 25 MG capsule Take 1 capsule (25 mg total) by mouth at bedtime. 30 capsule 3   omeprazole (PRILOSEC) 40 MG capsule Take 40 mg by mouth daily.     oxyCODONE (ROXICODONE) 15 MG immediate release tablet Take 1 tablet (15 mg total) by mouth 5 (five) times daily as  needed for pain. 150 tablet 0   oxyCODONE ER (XTAMPZA ER) 9 MG C12A Take 1 capsule by mouth every 12 (twelve) hours. 60 capsule 0   promethazine (PHENERGAN) 25 MG tablet Take 25 mg by mouth as needed.  simvastatin (ZOCOR) 10 MG tablet Take 10 mg by mouth daily.     topiramate (TOPAMAX) 25 MG tablet Take 1 tablet (25 mg total) by mouth 2 (two) times daily. 60 tablet 2   TRUE METRIX BLOOD GLUCOSE TEST test strip daily.     valACYclovir (VALTREX) 500 MG tablet Take 500 mg by mouth daily.      levalbuterol (XOPENEX HFA) 45 MCG/ACT inhaler Inhale 1-2 puffs into the lungs every 4 (four) hours as needed. For shortness of breath (Patient not taking: Reported on 04/06/2021) 1 Inhaler 6   No current facility-administered medications for this visit.   Facility-Administered Medications Ordered in Other Visits  Medication Dose Route Frequency Provider Last Rate Last Admin   levalbuterol (XOPENEX) nebulizer solution 0.63 mg  0.63 mg Nebulization Once Parrett, Tammy S, NP        REVIEW OF SYSTEMS:  [X]  denotes positive finding, [ ]  denotes negative finding Cardiac  Comments:  Chest pain or chest pressure:    Shortness of breath upon exertion:    Short of breath when lying flat:    Irregular heart rhythm:        Vascular    Pain in calf, thigh, or hip brought on by ambulation:    Pain in feet at night that wakes you up from your sleep:     Blood clot in your veins:    Leg swelling:  x Left      Pulmonary    Oxygen at home:    Productive cough:     Wheezing:         Neurologic    Sudden weakness in arms or legs:     Sudden numbness in arms or legs:     Sudden onset of difficulty speaking or slurred speech:    Temporary loss of vision in one eye:     Problems with dizziness:         Gastrointestinal    Blood in stool:     Vomited blood:         Genitourinary    Burning when urinating:     Blood in urine:        Psychiatric    Major depression:         Hematologic    Bleeding  problems:    Problems with blood clotting too easily:        Skin    Rashes or ulcers:        Constitutional    Fever or chills:     PHYSICAL EXAM:   Vitals:   04/06/21 1343  BP: 124/75  Pulse: 87  Resp: 14  Temp: 97.8 F (36.6 C)  TempSrc: Temporal  SpO2: 100%  Weight: 152 lb (68.9 kg)  Height: 5\' 5"  (1.651 m)    GENERAL: The patient is a well-nourished female, in no acute distress. The vital signs are documented above. CARDIAC: There is a regular rate and rhythm.  VASCULAR: I do not detect carotid bruits. She has palpable pedal pulses. She has some dilated varicose veins bilaterally but more so on the left. I did look at her left great saphenous vein myself with the SonoSite and she has developed some recurrent reflux from the saphenofemoral junction to the distal thigh.  In the mid segment of the thigh there is a significantly tortuous segment.  The vein is moderately dilated with diameters ranging from 4.5-5.5 mm. PULMONARY: There is good air exchange bilaterally without wheezing or rales. ABDOMEN: Soft and  non-tender with normal pitched bowel sounds.  MUSCULOSKELETAL: There are no major deformities or cyanosis. NEUROLOGIC: No focal weakness or paresthesias are detected. SKIN: There are no ulcers or rashes noted. PSYCHIATRIC: The patient has a normal affect.  DATA:    VENOUS DUPLEX: I have independently interpreted her venous duplex scan today of the left lower extremity.  There is no evidence of DVT or superficial venous thrombosis.  There is deep venous reflux involving the common femoral vein.  There is reflux in the left great saphenous vein from the saphenofemoral junction to the distal thigh and the vein is very tortuous.     Deitra Mayo [facility] [phone]

## 2021-04-07 ENCOUNTER — Other Ambulatory Visit (HOSPITAL_COMMUNITY): Payer: Self-pay

## 2021-04-19 ENCOUNTER — Encounter: Payer: Self-pay | Admitting: Registered Nurse

## 2021-04-19 ENCOUNTER — Encounter: Payer: Medicare Other | Attending: Physical Medicine & Rehabilitation | Admitting: Registered Nurse

## 2021-04-19 ENCOUNTER — Ambulatory Visit
Admission: RE | Admit: 2021-04-19 | Discharge: 2021-04-19 | Disposition: A | Discharge status: Home / Self Care | Payer: Medicare Other | Source: Ambulatory Visit | Attending: Internal Medicine | Admitting: Internal Medicine

## 2021-04-19 ENCOUNTER — Other Ambulatory Visit: Payer: Self-pay

## 2021-04-19 VITALS — BP 119/77 | HR 95 | Ht 65.0 in | Wt 158.0 lb

## 2021-04-19 DIAGNOSIS — M546 Pain in thoracic spine: Secondary | ICD-10-CM | POA: Insufficient documentation

## 2021-04-19 DIAGNOSIS — M5416 Radiculopathy, lumbar region: Secondary | ICD-10-CM | POA: Diagnosis not present

## 2021-04-19 DIAGNOSIS — Z79891 Long term (current) use of opiate analgesic: Secondary | ICD-10-CM

## 2021-04-19 DIAGNOSIS — M542 Cervicalgia: Secondary | ICD-10-CM

## 2021-04-19 DIAGNOSIS — M961 Postlaminectomy syndrome, not elsewhere classified: Secondary | ICD-10-CM

## 2021-04-19 DIAGNOSIS — Z1231 Encounter for screening mammogram for malignant neoplasm of breast: Secondary | ICD-10-CM

## 2021-04-19 DIAGNOSIS — G8929 Other chronic pain: Secondary | ICD-10-CM | POA: Diagnosis not present

## 2021-04-19 DIAGNOSIS — G039 Meningitis, unspecified: Secondary | ICD-10-CM

## 2021-04-19 DIAGNOSIS — G894 Chronic pain syndrome: Secondary | ICD-10-CM

## 2021-04-19 DIAGNOSIS — M5412 Radiculopathy, cervical region: Secondary | ICD-10-CM | POA: Diagnosis not present

## 2021-04-19 DIAGNOSIS — Z5181 Encounter for therapeutic drug level monitoring: Secondary | ICD-10-CM | POA: Diagnosis not present

## 2021-04-19 MED ORDER — TOPIRAMATE 25 MG PO TABS: 25.0000 mg | ORAL_TABLET | Freq: Two times a day (BID) | ORAL | 1 refills | Status: DC

## 2021-04-19 MED ORDER — NORTRIPTYLINE HCL 25 MG PO CAPS: 25.0000 mg | ORAL_CAPSULE | Freq: Every day | ORAL | 1 refills | Status: DC

## 2021-04-19 MED ORDER — XTAMPZA ER 9 MG PO C12A: EXTENDED_RELEASE_CAPSULE | ORAL | 0 refills | Status: DC

## 2021-04-19 MED ORDER — OXYCODONE HCL 15 MG PO TABS
15.0000 mg | ORAL_TABLET | Freq: Every day | ORAL | 0 refills | Status: DC | PRN
Start: 2021-04-19 — End: 2021-05-17

## 2021-04-19 NOTE — Progress Notes (Signed)
Subjective:    Patient ID: [name] [name], female    DOB: 1950-11-29, 70 y.o.   MRN: [id number]  HPI: [name] [name] is a 70 y.o. female who returns for follow up appointment for chronic pain and medication refill. She states her pain is located in her neck radiating into her bilateral shoulders, mid- lower back radiating into her bilateral lower extremities. She rates her pain 8. Her current exercise regime is walking and performing stretching exercises.  [name] [name] Morphine equivalent is 142.50 MME.  [name] [name] is tolerating the slow weaning of Xtampza .  Last UDS was 01/20/2021, it was consistent.      Pain Inventory Average Pain 7 Pain Right Now 8 My pain is constant, sharp, burning, stabbing, and tingling  In the last 24 hours, has pain interfered with the following? General activity 7 Relation with others 7 Enjoyment of life 7 What TIME of day is your pain at its worst? morning , daytime, evening, and night Sleep (in general) Fair  Pain is worse with: walking, bending, sitting, inactivity, standing, and some activites Pain improves with: heat/ice, pacing activities, medication, and heat Relief from Meds: 7  Family History  Problem Relation Age of Onset   Hypertension Mother    Heart disease Mother        Mitral valve replacement.    Diabetes Mother    Cancer Father    Diabetes Brother    Colon cancer Neg Hx    Colon polyps Neg Hx    Esophageal cancer Neg Hx    Rectal cancer Neg Hx    Stomach cancer Neg Hx    Social History   Socioeconomic History   Marital status: Divorced    Spouse name: Not on file   Number of children: Not on file   Years of education: Not on file   Highest education level: Not on file  Occupational History   Occupation: disable    Employer: DISABLED  Tobacco Use   Smoking status: Never   Smokeless tobacco: Never  Vaping Use   Vaping Use: Never used  Substance and Sexual Activity   Alcohol use: No   Drug use: No   Sexual activity: Not  on file    Comment: second hand smoke  Other Topics Concern   Not on file  Social History Narrative   Has one child lives at home with her.    Social Determinants of Health   Financial Resource Strain: Not on file  Food Insecurity: Not on file  Transportation Needs: Not on file  Physical Activity: Not on file  Stress: Not on file  Social Connections: Not on file   Past Surgical History:  Procedure Laterality Date   ABDOMINAL HYSTERECTOMY  1987   W/ [place] DECOMP/DISCECTOMY FUSION N/A 08/09/2012   Procedure: ANTERIOR CERVICAL DECOMPRESSION/DISCECTOMY FUSION 1 LEVEL;  Surgeon: Floyce Stakes, MD;  Location: [facility];  Service: Neurosurgery;  Laterality: N/A;  Cervical four-five  Anterior cervical decompression/diskectomy/fusion   ANTERIOR FUSION CERVICAL SPINE  2007   C5 -6   APPENDECTOMY     APPLICATION OF INTRAOPERATIVE CT SCAN N/A 05/17/2017   Procedure: APPLICATION OF INTRAOPERATIVE CT SCAN;  Surgeon: Erline Levine, MD;  Location: [place];  Service: Neurosurgery;  Laterality: N/A;   BACK SURGERY     x 5   BREAST EXCISIONAL BIOPSY Bilateral    No scar seen    BREAST SURGERY     x 2 biopsies  CARPAL TUNNEL RELEASE  RIGHT - 2001  & DEC 2011 W/ BACK SURG.   CERVICAL DISC SURGERY  2005   C5 - 6   CHOLECYSTECTOMY  1994   DORSAL COMPARTMENT RELEASE Right 04/07/2013   Procedure: RIGHT WRIST STS RELEASE;  Surgeon: Schuyler Amor, MD;  Location: [place];  Service: Orthopedics;  Laterality: Right;   ENDOVENOUS ABLATION SAPHENOUS VEIN W/ LASER Right 05/29/2019   endovenous laser ablation right greater saphenous vein by Gae Gallop MD    EXPLORATION OF INCISION FOR CSF LEAK  DEC 2011  X2   POST [place] Right 04/07/2013   Procedure: RIGHT INDEX AND RIGHT LONG DISTAL INTERPHALANGEAL JOINT FUSIONS;  Surgeon: Schuyler Amor, MD;  Location: [place];  Service: Orthopedics;  Laterality: Right;    FINGER ARTHROPLASTY Right 04/07/2013   Procedure: RIGHT THUMB [place] ARTHROPLASTY;  Surgeon: Schuyler Amor, MD;  Location: [place];  Service: Orthopedics;  Laterality: Right;   GANGLION CYST EXCISION Right 04/07/2013   Procedure: RIGHT WRIST MASS EXCISION;  Surgeon: Schuyler Amor, MD;  Location: [place];  Service: Orthopedics;  Laterality: Right;   KNEE ARTHROSCOPY  LEFT X3 (LAST ONE 2005)   KNEE ARTHROSCOPY  05/17/2011   Procedure: ARTHROSCOPY KNEE;  Surgeon: Bradley Ferris III;  Location: [place];  Service: Orthopedics;  Laterality: Right;  WITH MEDIAL MENISECTOMY AND removal of suprapatella fat lump   LEFT WRIST TENOSYNECTOMY W/ LEFT THUMB JOINT REPAIR  02-24-10   LUMBAR FUSION  11-30-10   L4 - 5   LUMBAR FUSION  2000   L2 - 4   LUMBAR LAMINECTOMY  DEC 2011   L4 - 5   POSTERIOR CERVICAL FUSION/FORAMINOTOMY N/A 05/17/2017   Procedure: Cervical five  to Thorastic one  Posterior cervical fusion with lateral mass screws/revision of prior instrumentation;  Surgeon: Erline Levine, MD;  Location: [place];  Service: Neurosurgery;  Laterality: N/A;  C5 to T1 Posterior cervical fusion with lateral mass screws/revision of prior instrumentation   TENDON REPAIR  JAN 2010   LEFT INDEX AND LONG FINGERS   THUMB SURGERY   04/07/2019   RIGHT THUMB   TUBAL LIGATION     Past Surgical History:  Procedure Laterality Date   ABDOMINAL HYSTERECTOMY  1987   W/ [facility]   ANTERIOR CERVICAL DECOMP/DISCECTOMY FUSION N/A 08/09/2012   Procedure: ANTERIOR CERVICAL DECOMPRESSION/DISCECTOMY FUSION 1 LEVEL;  Surgeon: Floyce Stakes, MD;  Location: [facility];  Service: Neurosurgery;  Laterality: N/A;  Cervical four-five  Anterior cervical decompression/diskectomy/fusion   ANTERIOR FUSION CERVICAL SPINE  2007   C5 -6   APPENDECTOMY     APPLICATION OF INTRAOPERATIVE CT SCAN N/A 05/17/2017   Procedure: APPLICATION OF INTRAOPERATIVE CT SCAN;  Surgeon: Erline Levine, MD;  Location: [place];  Service: Neurosurgery;  Laterality: N/A;   BACK SURGERY     x 5   BREAST EXCISIONAL BIOPSY Bilateral    No scar seen    BREAST SURGERY     x 2 biopsies   CARPAL TUNNEL RELEASE  RIGHT - 2001  & DEC 2011 W/ BACK SURG.   CERVICAL DISC SURGERY  2005   C5 - 6   CHOLECYSTECTOMY  1994   DORSAL COMPARTMENT RELEASE Right 04/07/2013   Procedure: RIGHT WRIST STS RELEASE;  Surgeon: Schuyler Amor, MD;  Location: [place];  Service: Orthopedics;  Laterality: Right;   ENDOVENOUS ABLATION SAPHENOUS  VEIN W/ LASER Right 05/29/2019   endovenous laser ablation right greater saphenous vein by Gae Gallop MD    EXPLORATION OF INCISION FOR CSF LEAK  DEC 2011  X2   POST [place] Right 04/07/2013   Procedure: RIGHT INDEX AND RIGHT LONG DISTAL INTERPHALANGEAL JOINT FUSIONS;  Surgeon: Schuyler Amor, MD;  Location: [place];  Service: Orthopedics;  Laterality: Right;   FINGER ARTHROPLASTY Right 04/07/2013   Procedure: RIGHT THUMB [place] ARTHROPLASTY;  Surgeon: Schuyler Amor, MD;  Location: [place];  Service: Orthopedics;  Laterality: Right;   GANGLION CYST EXCISION Right 04/07/2013   Procedure: RIGHT WRIST MASS EXCISION;  Surgeon: Schuyler Amor, MD;  Location: [place];  Service: Orthopedics;  Laterality: Right;   KNEE ARTHROSCOPY  LEFT X3 (LAST ONE 2005)   KNEE ARTHROSCOPY  05/17/2011   Procedure: ARTHROSCOPY KNEE;  Surgeon: Bradley Ferris III;  Location: [place];  Service: Orthopedics;  Laterality: Right;  WITH MEDIAL MENISECTOMY AND removal of suprapatella fat lump   LEFT WRIST TENOSYNECTOMY W/ LEFT THUMB JOINT REPAIR  02-24-10   LUMBAR FUSION  11-30-10   L4 - 5   LUMBAR FUSION  2000   L2 - 4   LUMBAR LAMINECTOMY  DEC 2011   L4 - 5   POSTERIOR CERVICAL FUSION/FORAMINOTOMY N/A 05/17/2017   Procedure: Cervical five  to Thorastic one  Posterior  cervical fusion with lateral mass screws/revision of prior instrumentation;  Surgeon: Erline Levine, MD;  Location: [place];  Service: Neurosurgery;  Laterality: N/A;  C5 to T1 Posterior cervical fusion with lateral mass screws/revision of prior instrumentation   TENDON REPAIR  JAN 2010   LEFT INDEX AND LONG FINGERS   THUMB SURGERY   04/07/2019   RIGHT THUMB   TUBAL LIGATION     Past Medical History:  Diagnosis Date   Acute sinusitis, unspecified    Allergy    Anemia    "quite a few times"   Anxiety state, unspecified    Arachnoiditis BILATERAL LEGS   DUE TO MULTIPLE BACK SURG.'S   Arthritis    Asthma    last flare up was 03/2017 lasted over a month   Blood transfusion    Cancer ([place])    skin cancers (in scalp)   Cardiomyopathy HX --06/2010   EF was 25% during acute illness (PHELONEPHRITIS) Repeat echo 12-06-10 60% showed normal EF.    Chronic back pain greater than 3 months duration    S/P BACK SURG'S   CSF leak    Diabetes mellitus without complication ([place])    dx 2013 type 2   Dyslipidemia    Dysphagia    some post op cerv fusion 2/14   Dysrhythmia    Essential hypertension, benign    Family history of adverse reaction to anesthesia    mother gets n/v   GERD (gastroesophageal reflux disease) AND HIATIAL HERNIA   CONTROLLED W/ NEXIUM   Headache(784.0)    History of chronic bronchitis    Hx of bladder infections    Hyperlipidemia    TAKE CHLOSTEROL MEDICATION,04/23/19   Hypertension    Neuromuscular disorder (HCC)    numbness and tingling   Osteoporosis    Other malaise and fatigue    PONV (postoperative nausea and vomiting)    Shortness of breath    Spinal headache    Spinal stenosis, cervical region    Varicosities    venous   Weakness  of both legs DUE TO ARACHNOIDITIS   OCCASIONAL USES CANE   BP 119/77   Pulse 95   Ht 5\' 5"  (1.651 m)   Wt 158 lb (71.7 kg)   SpO2 96%   BMI 26.29 kg/m   Opioid Risk Score:   Fall Risk Score:  `1  Depression screen PHQ  2/9  Depression screen [facility] 2/9 02/18/2021 01/20/2021 12/22/2020 11/22/2020 10/21/2020 09/22/2020 09/22/2020  Decreased Interest 0 0 1 1 0 0 0  Down, Depressed, Hopeless 0 0 1 1 0 0 0  PHQ - 2 Score 0 0 2 2 0 0 0  Altered sleeping - - - 1 - - -  PHQ-9 Score - - - 3 - - -  Some recent data might be hidden    Review of Systems  Musculoskeletal:  Positive for back pain, gait problem and neck pain.       Pain both shoulders  All other systems reviewed and are negative.     Objective:   Physical Exam Vitals and nursing note reviewed.  Constitutional:      Appearance: Normal appearance.  Neck:     Comments: Cervical Paraspinal Tenderness: C-5-C-6 Cardiovascular:     Rate and Rhythm: Normal rate and regular rhythm.     Pulses: Normal pulses.     Heart sounds: Normal heart sounds.  Pulmonary:     Effort: Pulmonary effort is normal.     Breath sounds: Normal breath sounds.  Musculoskeletal:     Cervical back: Normal range of motion and neck supple.     Comments: Normal Muscle Bulk and Muscle Testing Reveals:  Upper Extremities: Full ROM and Muscle Strength  5/5 Bilateral AC Joint Tenderness Thoracic and Lumbar Hypersensitivity Lower Extremities : Full ROM and Muscle Strength 5/5 Arises from Table with ease Narrow Based Gait     Skin:    General: Skin is warm and dry.  Neurological:     Mental Status: She is alert and oriented to person, place, and time.  Psychiatric:        Mood and Affect: Mood normal.        Behavior: Behavior normal.          Assessment & Plan:  1. On 05/17/2017 :C5C6C7 T1 Posterior Cervical Fusion with lateral mass fixation with AIRO Imaging, revision of prior instrumentation. By Dr. Vertell Limber.  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 Continue current medication regimen with  Gabapentin 800 mg BID. [place]: Oxycodone 15mg  one tablet 5 times a day  as needed for pain # 150 tablets and Continue with slow weaning of Xtampza: Xtampza 9  mg every 12 hours  #30, alternate QOD. 04/19/2021 We will continue the opioid monitoring program, this consists of regular clinic visits, examinations, urine drug screen, pill counts as well as use of [place] Controlled Substance Reporting system. 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 Continue to Monitor. 04/19/2021 3. Anxiety/depression: PCP Following. Continue to monitor. 04/19/2021 4. Muscle Spasms: Continue current medication regime with Flexeril. Continue to Monitor. 04/19/2021 5. Cervicalgia/ Cervical Radiculitis: Dr Vertell Limber Following. ,Continue current medication regime with Gabapentin:  S/P  C5C6C7 T1 Posterior Cervical Fusion with lateral mass fixation with AIRO Imaging, revision of prior instrumentation by Dr. Vertell Limber on 05/17/2017. 04/19/2021 7. Bilateral Thoracic Back Pain: Continue HEP as Tolerated and Continue current medication regimen. Continue to Monitor.  04/19/2021  8. Left lower extremity DVT/ Phlebitis:  S/P endovenous laser ablation of left great saphenous vein on 05/09/2018 by Dr. Scot Dock:  Vascular  Following. 04/19/2021. 9. Insomnia: Continue Pamelor. Continue to Monitor. 04/19/2021. 10. Left Knee Pain: No complaints today. Ortho Following. 04/19/2021     F/U in 1 month

## 2021-04-29 ENCOUNTER — Telehealth: Payer: Self-pay | Admitting: Registered Nurse

## 2021-04-29 NOTE — Telephone Encounter (Signed)
Dr Naaman Plummer,  I have slowly weaned Ms. Maryclare Bean, she will be totally off  the Xtampza this week, she reported she wasn't receiving any relief of her pain with the [place].  Ms. Cassata MME a few months ago was 237.50 MME.  Her MME right Now is 142.50 MME. When I seen her a few months ago she asked if she could go back on the Hydromorphone, I told her she had to be weaned slowly off the [place].  I have reviewed the PMP , when she was on Hydromorphone ER 12 mg  #60 tablets in 2018, the MME was 96.00 and she was on Oxycodone 15 mg #120 tablets this will equal 186.00 MME Her current dose of Oxycodone 15 mg one tablet 5 times a day =112.50 , she seems to be tolerating the slow wean, not sure if she really needs the Hydromorphone.  Ms. Burandt wanted me to get your input. I can have her scheduled an appointment with you, so you can assess her. I will await your response.  Thanks for your help.

## 2021-05-02 ENCOUNTER — Telehealth: Payer: Self-pay | Admitting: Registered Nurse

## 2021-05-02 NOTE — Telephone Encounter (Signed)
Placed a call to Ms. Volner regarding her medication, this provider have reviewed Dr Naaman Plummer response.  Awaiting a return call.

## 2021-05-05 DIAGNOSIS — M180 Bilateral primary osteoarthritis of first carpometacarpal joints: Secondary | ICD-10-CM | POA: Diagnosis not present

## 2021-05-05 DIAGNOSIS — H02132 Senile ectropion of right lower eyelid: Secondary | ICD-10-CM | POA: Diagnosis not present

## 2021-05-05 DIAGNOSIS — H02135 Senile ectropion of left lower eyelid: Secondary | ICD-10-CM | POA: Diagnosis not present

## 2021-05-05 DIAGNOSIS — M19049 Primary osteoarthritis, unspecified hand: Secondary | ICD-10-CM | POA: Diagnosis not present

## 2021-05-05 DIAGNOSIS — Z981 Arthrodesis status: Secondary | ICD-10-CM | POA: Diagnosis not present

## 2021-05-17 MED ORDER — OXYCODONE HCL 15 MG PO TABS: 15.0000 mg | ORAL_TABLET | Freq: Every day | ORAL | 0 refills | Status: DC | PRN

## 2021-05-17 NOTE — Telephone Encounter (Signed)
Oxycodone filled.

## 2021-05-19 ENCOUNTER — Other Ambulatory Visit: Payer: Self-pay | Admitting: Internal Medicine

## 2021-05-26 DIAGNOSIS — Z20822 Contact with and (suspected) exposure to covid-19: Secondary | ICD-10-CM | POA: Diagnosis not present

## 2021-06-04 DIAGNOSIS — Z23 Encounter for immunization: Secondary | ICD-10-CM | POA: Diagnosis not present

## 2021-06-07 DIAGNOSIS — M180 Bilateral primary osteoarthritis of first carpometacarpal joints: Secondary | ICD-10-CM | POA: Diagnosis not present

## 2021-06-07 DIAGNOSIS — M79641 Pain in right hand: Secondary | ICD-10-CM | POA: Diagnosis not present

## 2021-06-07 DIAGNOSIS — M19049 Primary osteoarthritis, unspecified hand: Secondary | ICD-10-CM | POA: Diagnosis not present

## 2021-06-08 ENCOUNTER — Other Ambulatory Visit: Payer: Self-pay

## 2021-06-08 ENCOUNTER — Encounter: Payer: Medicare Other | Attending: Physical Medicine & Rehabilitation | Admitting: Registered Nurse

## 2021-06-08 ENCOUNTER — Encounter: Payer: Self-pay | Admitting: Registered Nurse

## 2021-06-08 VITALS — BP 93/63 | HR 95 | Temp 99.0°F | Ht 65.0 in | Wt 161.2 lb

## 2021-06-08 DIAGNOSIS — M5412 Radiculopathy, cervical region: Secondary | ICD-10-CM | POA: Diagnosis not present

## 2021-06-08 DIAGNOSIS — G894 Chronic pain syndrome: Secondary | ICD-10-CM | POA: Insufficient documentation

## 2021-06-08 DIAGNOSIS — M542 Cervicalgia: Secondary | ICD-10-CM | POA: Insufficient documentation

## 2021-06-08 DIAGNOSIS — M961 Postlaminectomy syndrome, not elsewhere classified: Secondary | ICD-10-CM | POA: Diagnosis not present

## 2021-06-08 DIAGNOSIS — M5416 Radiculopathy, lumbar region: Secondary | ICD-10-CM | POA: Diagnosis not present

## 2021-06-08 DIAGNOSIS — Z79891 Long term (current) use of opiate analgesic: Secondary | ICD-10-CM | POA: Diagnosis not present

## 2021-06-08 DIAGNOSIS — G8929 Other chronic pain: Secondary | ICD-10-CM | POA: Insufficient documentation

## 2021-06-08 DIAGNOSIS — Z5181 Encounter for therapeutic drug level monitoring: Secondary | ICD-10-CM | POA: Diagnosis not present

## 2021-06-08 DIAGNOSIS — M546 Pain in thoracic spine: Secondary | ICD-10-CM | POA: Diagnosis not present

## 2021-06-08 MED ORDER — OXYCODONE HCL 15 MG PO TABS: 15.0000 mg | ORAL_TABLET | Freq: Every day | ORAL | 0 refills | Status: DC | PRN

## 2021-06-08 MED ORDER — GABAPENTIN 800 MG PO TABS: 800.0000 mg | ORAL_TABLET | Freq: Three times a day (TID) | ORAL | 3 refills | Status: DC

## 2021-06-08 NOTE — Progress Notes (Signed)
Subjective:    Patient ID: [name] [name], female    DOB: [birth year], 70 y.o.   MRN: [id number]  HPI: [name] [name] is a 70 y.o. female who returns for follow up appointment for chronic pain and medication refill. She states her pain is located in her neck radiating into her bilateral shoulders, mid- lower back pain radiating into her bilateral hips and bilateral lower extremities. She rates her pain 8. Her current exercise regime is walking and performing stretching exercises.  Ms. Boehne Morphine equivalent is  112.50MME.   Last UDS was Performed on 01/20/2021, it was consistent.     Pain Inventory Average Pain 7 Pain Right Now 8 My pain is sharp, burning, stabbing, and tingling  In the last 24 hours, has pain interfered with the following? General activity 7 Relation with others 7 Enjoyment of life 7 What TIME of day is your pain at its worst? morning , daytime, evening, and night Sleep (in general) Fair  Pain is worse with: walking, bending, sitting, inactivity, standing, and some activites Pain improves with: rest, heat/ice, and medication Relief from Meds: 7  Family History  Problem Relation Age of Onset   Hypertension Mother    Heart disease Mother        Mitral valve replacement.    Diabetes Mother    Cancer Father    Diabetes Brother    Colon cancer Neg Hx    Colon polyps Neg Hx    Esophageal cancer Neg Hx    Rectal cancer Neg Hx    Stomach cancer Neg Hx    Social History   Socioeconomic History   Marital status: Divorced    Spouse name: Not on file   Number of children: Not on file   Years of education: Not on file   Highest education level: Not on file  Occupational History   Occupation: disable    Employer: DISABLED  Tobacco Use   Smoking status: Never   Smokeless tobacco: Never  Vaping Use   Vaping Use: Never used  Substance and Sexual Activity   Alcohol use: No   Drug use: No   Sexual activity: Not on file    Comment: second hand smoke  Other  Topics Concern   Not on file  Social History Narrative   Has one child lives at home with her.    Social Determinants of Health   Financial Resource Strain: Not on file  Food Insecurity: Not on file  Transportation Needs: Not on file  Physical Activity: Not on file  Stress: Not on file  Social Connections: Not on file   Past Surgical History:  Procedure Laterality Date   ABDOMINAL HYSTERECTOMY  1987   W/ [place] DECOMP/DISCECTOMY FUSION N/A 08/09/2012   Procedure: ANTERIOR CERVICAL DECOMPRESSION/DISCECTOMY FUSION 1 LEVEL;  Surgeon: Floyce Stakes, MD;  Location: [facility];  Service: Neurosurgery;  Laterality: N/A;  Cervical four-five  Anterior cervical decompression/diskectomy/fusion   ANTERIOR FUSION CERVICAL SPINE  2007   C5 -6   APPENDECTOMY     APPLICATION OF INTRAOPERATIVE CT SCAN N/A 05/17/2017   Procedure: APPLICATION OF INTRAOPERATIVE CT SCAN;  Surgeon: Erline Levine, MD;  Location: [place];  Service: Neurosurgery;  Laterality: N/A;   BACK SURGERY     x 5   BREAST EXCISIONAL BIOPSY Bilateral    No scar seen    BREAST SURGERY     x 2 biopsies   CARPAL TUNNEL RELEASE  RIGHT -  2001  & DEC 2011 W/ BACK SURG.   CERVICAL DISC SURGERY  2005   C5 - 6   CHOLECYSTECTOMY  1994   DORSAL COMPARTMENT RELEASE Right 04/07/2013   Procedure: RIGHT WRIST STS RELEASE;  Surgeon: Schuyler Amor, MD;  Location: [place];  Service: Orthopedics;  Laterality: Right;   ENDOVENOUS ABLATION SAPHENOUS VEIN W/ LASER Right 05/29/2019   endovenous laser ablation right greater saphenous vein by Gae Gallop MD    EXPLORATION OF INCISION FOR CSF LEAK  DEC 2011  X2   POST [place] Right 04/07/2013   Procedure: RIGHT INDEX AND RIGHT LONG DISTAL INTERPHALANGEAL JOINT FUSIONS;  Surgeon: Schuyler Amor, MD;  Location: [place];  Service: Orthopedics;  Laterality: Right;   FINGER ARTHROPLASTY Right 04/07/2013   Procedure:  RIGHT THUMB [place] ARTHROPLASTY;  Surgeon: Schuyler Amor, MD;  Location: [place];  Service: Orthopedics;  Laterality: Right;   GANGLION CYST EXCISION Right 04/07/2013   Procedure: RIGHT WRIST MASS EXCISION;  Surgeon: Schuyler Amor, MD;  Location: [place];  Service: Orthopedics;  Laterality: Right;   KNEE ARTHROSCOPY  LEFT X3 (LAST ONE 2005)   KNEE ARTHROSCOPY  05/17/2011   Procedure: ARTHROSCOPY KNEE;  Surgeon: Bradley Ferris III;  Location: [place];  Service: Orthopedics;  Laterality: Right;  WITH MEDIAL MENISECTOMY AND removal of suprapatella fat lump   LEFT WRIST TENOSYNECTOMY W/ LEFT THUMB JOINT REPAIR  02-24-10   LUMBAR FUSION  11-30-10   L4 - 5   LUMBAR FUSION  2000   L2 - 4   LUMBAR LAMINECTOMY  DEC 2011   L4 - 5   POSTERIOR CERVICAL FUSION/FORAMINOTOMY N/A 05/17/2017   Procedure: Cervical five  to Thorastic one  Posterior cervical fusion with lateral mass screws/revision of prior instrumentation;  Surgeon: Erline Levine, MD;  Location: [place];  Service: Neurosurgery;  Laterality: N/A;  C5 to T1 Posterior cervical fusion with lateral mass screws/revision of prior instrumentation   TENDON REPAIR  JAN 2010   LEFT INDEX AND LONG FINGERS   THUMB SURGERY   04/07/2019   RIGHT THUMB   TUBAL LIGATION     Past Surgical History:  Procedure Laterality Date   ABDOMINAL HYSTERECTOMY  1987   W/ [facility]   ANTERIOR CERVICAL DECOMP/DISCECTOMY FUSION N/A 08/09/2012   Procedure: ANTERIOR CERVICAL DECOMPRESSION/DISCECTOMY FUSION 1 LEVEL;  Surgeon: Floyce Stakes, MD;  Location: [facility];  Service: Neurosurgery;  Laterality: N/A;  Cervical four-five  Anterior cervical decompression/diskectomy/fusion   ANTERIOR FUSION CERVICAL SPINE  2007   C5 -6   APPENDECTOMY     APPLICATION OF INTRAOPERATIVE CT SCAN N/A 05/17/2017   Procedure: APPLICATION OF INTRAOPERATIVE CT SCAN;  Surgeon: Erline Levine, MD;  Location: [place];  Service: Neurosurgery;   Laterality: N/A;   BACK SURGERY     x 5   BREAST EXCISIONAL BIOPSY Bilateral    No scar seen    BREAST SURGERY     x 2 biopsies   CARPAL TUNNEL RELEASE  RIGHT - 2001  & DEC 2011 W/ BACK SURG.   CERVICAL DISC SURGERY  2005   C5 - 6   CHOLECYSTECTOMY  1994   DORSAL COMPARTMENT RELEASE Right 04/07/2013   Procedure: RIGHT WRIST STS RELEASE;  Surgeon: Schuyler Amor, MD;  Location: [place];  Service: Orthopedics;  Laterality: Right;   ENDOVENOUS ABLATION SAPHENOUS VEIN W/ LASER Right 05/29/2019  endovenous laser ablation right greater saphenous vein by Gae Gallop MD    EXPLORATION OF INCISION FOR CSF LEAK  DEC 2011  X2   POST [place] Right 04/07/2013   Procedure: RIGHT INDEX AND RIGHT LONG DISTAL INTERPHALANGEAL JOINT FUSIONS;  Surgeon: Schuyler Amor, MD;  Location: [place];  Service: Orthopedics;  Laterality: Right;   FINGER ARTHROPLASTY Right 04/07/2013   Procedure: RIGHT THUMB [place] ARTHROPLASTY;  Surgeon: Schuyler Amor, MD;  Location: [place];  Service: Orthopedics;  Laterality: Right;   GANGLION CYST EXCISION Right 04/07/2013   Procedure: RIGHT WRIST MASS EXCISION;  Surgeon: Schuyler Amor, MD;  Location: [place];  Service: Orthopedics;  Laterality: Right;   KNEE ARTHROSCOPY  LEFT X3 (LAST ONE 2005)   KNEE ARTHROSCOPY  05/17/2011   Procedure: ARTHROSCOPY KNEE;  Surgeon: Bradley Ferris III;  Location: [place];  Service: Orthopedics;  Laterality: Right;  WITH MEDIAL MENISECTOMY AND removal of suprapatella fat lump   LEFT WRIST TENOSYNECTOMY W/ LEFT THUMB JOINT REPAIR  02-24-10   LUMBAR FUSION  11-30-10   L4 - 5   LUMBAR FUSION  2000   L2 - 4   LUMBAR LAMINECTOMY  DEC 2011   L4 - 5   POSTERIOR CERVICAL FUSION/FORAMINOTOMY N/A 05/17/2017   Procedure: Cervical five  to Thorastic one  Posterior cervical fusion with lateral mass screws/revision of prior  instrumentation;  Surgeon: Erline Levine, MD;  Location: [place];  Service: Neurosurgery;  Laterality: N/A;  C5 to T1 Posterior cervical fusion with lateral mass screws/revision of prior instrumentation   TENDON REPAIR  JAN 2010   LEFT INDEX AND LONG FINGERS   THUMB SURGERY   04/07/2019   RIGHT THUMB   TUBAL LIGATION     Past Medical History:  Diagnosis Date   Acute sinusitis, unspecified    Allergy    Anemia    "quite a few times"   Anxiety state, unspecified    Arachnoiditis BILATERAL LEGS   DUE TO MULTIPLE BACK SURG.'S   Arthritis    Asthma    last flare up was 03/2017 lasted over a month   Blood transfusion    Cancer ([place])    skin cancers (in scalp)   Cardiomyopathy HX --06/2010   EF was 25% during acute illness (PHELONEPHRITIS) Repeat echo 12-06-10 60% showed normal EF.    Chronic back pain greater than 3 months duration    S/P BACK SURG'S   CSF leak    Diabetes mellitus without complication ([place])    dx 2013 type 2   Dyslipidemia    Dysphagia    some post op cerv fusion 2/14   Dysrhythmia    Essential hypertension, benign    Family history of adverse reaction to anesthesia    mother gets n/v   GERD (gastroesophageal reflux disease) AND HIATIAL HERNIA   CONTROLLED W/ NEXIUM   Headache(784.0)    History of chronic bronchitis    Hx of bladder infections    Hyperlipidemia    TAKE CHLOSTEROL MEDICATION,04/23/19   Hypertension    Neuromuscular disorder (HCC)    numbness and tingling   Osteoporosis    Other malaise and fatigue    PONV (postoperative nausea and vomiting)    Shortness of breath    Spinal headache    Spinal stenosis, cervical region    Varicosities    venous   Weakness of both legs DUE TO ARACHNOIDITIS  OCCASIONAL USES CANE   BP 93/63    Pulse 95    Temp 99 F (37.2 C)    Ht 5\' 5"  (1.651 m)    Wt 161 lb 3.2 oz (73.1 kg)    SpO2 93%    BMI 26.83 kg/m   Opioid Risk Score:   Fall Risk Score:  `1  Depression screen PHQ 2/9  Depression screen [facility]  2/9 04/19/2021 02/18/2021 01/20/2021 12/22/2020 11/22/2020 10/21/2020 09/22/2020  Decreased Interest 1 0 0 1 1 0 0  Down, Depressed, Hopeless 1 0 0 1 1 0 0  PHQ - 2 Score 2 0 0 2 2 0 0  Altered sleeping - - - - 1 - -  PHQ-9 Score - - - - 3 - -  Some recent data might be hidden     Review of Systems  Constitutional: Negative.   HENT: Negative.    Eyes: Negative.   Respiratory: Negative.    Cardiovascular: Negative.   Gastrointestinal: Negative.   Endocrine: Negative.   Genitourinary: Negative.   Musculoskeletal:  Positive for back pain and neck pain.  Skin: Negative.   Allergic/Immunologic: Negative.   Neurological: Negative.   Hematological: Negative.   Psychiatric/Behavioral: Negative.        Objective:   Physical Exam Vitals and nursing note reviewed.  Constitutional:      Appearance: Normal appearance.  Neck:     Comments: Cervical Paraspinal Tenderness: C-5-C-6 Cardiovascular:     Rate and Rhythm: Normal rate and regular rhythm.     Pulses: Normal pulses.     Heart sounds: Normal heart sounds.  Pulmonary:     Effort: Pulmonary effort is normal.     Breath sounds: Normal breath sounds.  Musculoskeletal:     Cervical back: Normal range of motion and neck supple.     Comments: Normal Muscle Bulk and Muscle Testing Reveals:  Upper Extremities:Full  ROM and Muscle Strength 5/5 Bilateral AC Joint Tenderness Thoracic and Lumbar Hypersensitivity Bilateral Greater Trochanter Tenderness Lower Extremities: Full ROM and Muscle Strength 5/5 Arises from Table with ease Narrow Based  Gait     Skin:    General: Skin is warm and dry.  Neurological:     Mental Status: She is alert and oriented to person, place, and time.  Psychiatric:        Mood and Affect: Mood normal.        Behavior: Behavior normal.         Assessment & Plan:  1. On 05/17/2017 :C5C6C7 T1 Posterior Cervical Fusion with lateral mass fixation with [facility], revision of prior instrumentation. By Dr. Vertell Limber.   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 Continue current medication regimen with  Gabapentin 800 mg BID. [place]: Oxycodone 15mg  one tablet 5 times a day  as needed for pain # 150 tablets 12/21/2022We will continue the opioid monitoring program, this consists of regular clinic visits, examinations, urine drug screen, pill counts as well as use of [place] Controlled Substance Reporting system. 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 Continue to Monitor. 06/08/2021 3. Anxiety/depression: PCP Following. Continue to monitor. 06/08/2021 4. Muscle Spasms: Continue current medication regime with Flexeril. Continue to Monitor. 06/08/2021 5. Cervicalgia/ Cervical Radiculitis: Dr Vertell Limber Following. ,Continue current medication regime with Gabapentin:  S/P  C5C6C7 T1 Posterior Cervical Fusion with lateral mass fixation with  [facility], revision of prior instrumentation by Dr. Vertell Limber on 05/17/2017. 06/08/2021 7. Bilateral Thoracic Back Pain: Continue HEP as Tolerated and Continue current medication regimen. Continue to Monitor. 06/08/2021  8. Left lower extremity DVT/ Phlebitis:  S/P endovenous laser ablation of left great saphenous vein on 05/09/2018 by Dr. Scot Dock:  Vascular  Following. 06/08/2021. 9. Insomnia: Continue Pamelor. Continue to Monitor. 06/08/2021. 10. Left Knee Pain: No complaints today. Ortho Following. 06/08/2021     F/U in 1 month

## 2021-06-10 ENCOUNTER — Other Ambulatory Visit: Payer: Self-pay | Admitting: Registered Nurse

## 2021-06-10 DIAGNOSIS — G039 Meningitis, unspecified: Secondary | ICD-10-CM

## 2021-06-10 DIAGNOSIS — M961 Postlaminectomy syndrome, not elsewhere classified: Secondary | ICD-10-CM

## 2021-06-14 DIAGNOSIS — E782 Mixed hyperlipidemia: Secondary | ICD-10-CM | POA: Diagnosis not present

## 2021-06-14 DIAGNOSIS — E1143 Type 2 diabetes mellitus with diabetic autonomic (poly)neuropathy: Secondary | ICD-10-CM | POA: Diagnosis not present

## 2021-06-14 DIAGNOSIS — I10 Essential (primary) hypertension: Secondary | ICD-10-CM | POA: Diagnosis not present

## 2021-06-29 DIAGNOSIS — R251 Tremor, unspecified: Secondary | ICD-10-CM | POA: Diagnosis not present

## 2021-06-29 DIAGNOSIS — G9389 Other specified disorders of brain: Secondary | ICD-10-CM | POA: Diagnosis not present

## 2021-07-15 ENCOUNTER — Other Ambulatory Visit: Payer: Self-pay

## 2021-07-15 ENCOUNTER — Other Ambulatory Visit: Payer: Self-pay | Admitting: Registered Nurse

## 2021-07-15 ENCOUNTER — Encounter: Payer: Medicare Other | Attending: Physical Medicine & Rehabilitation | Admitting: Registered Nurse

## 2021-07-15 VITALS — BP 113/75 | HR 83 | Ht 65.0 in | Wt 162.4 lb

## 2021-07-15 DIAGNOSIS — M546 Pain in thoracic spine: Secondary | ICD-10-CM | POA: Diagnosis not present

## 2021-07-15 DIAGNOSIS — M542 Cervicalgia: Secondary | ICD-10-CM | POA: Insufficient documentation

## 2021-07-15 DIAGNOSIS — M5412 Radiculopathy, cervical region: Secondary | ICD-10-CM | POA: Insufficient documentation

## 2021-07-15 DIAGNOSIS — G8929 Other chronic pain: Secondary | ICD-10-CM | POA: Diagnosis not present

## 2021-07-15 DIAGNOSIS — G894 Chronic pain syndrome: Secondary | ICD-10-CM | POA: Insufficient documentation

## 2021-07-15 DIAGNOSIS — M961 Postlaminectomy syndrome, not elsewhere classified: Secondary | ICD-10-CM | POA: Insufficient documentation

## 2021-07-15 DIAGNOSIS — M5416 Radiculopathy, lumbar region: Secondary | ICD-10-CM | POA: Insufficient documentation

## 2021-07-15 DIAGNOSIS — Z5181 Encounter for therapeutic drug level monitoring: Secondary | ICD-10-CM | POA: Insufficient documentation

## 2021-07-15 DIAGNOSIS — Z79891 Long term (current) use of opiate analgesic: Secondary | ICD-10-CM | POA: Insufficient documentation

## 2021-07-15 MED ORDER — OXYCODONE HCL 15 MG PO TABS: 15.0000 mg | ORAL_TABLET | Freq: Every day | ORAL | 0 refills | Status: DC | PRN

## 2021-07-15 NOTE — Progress Notes (Signed)
Subjective:    Patient ID: [name] [name], female    DOB: 1951/06/11, 71 y.o.   MRN: [id number]  HPI: [name] [name] is a 71 y.o. female who returns for follow up appointment for chronic pain and medication refill. She states her pain is located in her neck radiating into her bilateral shoulders and mid- lower back pain radiating into her bilateral lower extremities.. She rates her pain 8. Her current exercise regime is walking and performing stretching exercises.  Ms. Latka Morphine equivalent is 112.50 MME.   UDS ordered today.     Pain Inventory Average Pain 7 Pain Right Now 8 My pain is sharp, burning, stabbing, tingling, and aching  In the last 24 hours, has pain interfered with the following? General activity 7 Relation with others 7 Enjoyment of life 7 What TIME of day is your pain at its worst? morning , daytime, evening, and night Sleep (in general) Fair  Pain is worse with: walking, bending, sitting, inactivity, standing, and some activites Pain improves with: rest, heat/ice, pacing activities, and medication Relief from Meds: 7  Family History  Problem Relation Age of Onset   Hypertension Mother    Heart disease Mother        Mitral valve replacement.    Diabetes Mother    Cancer Father    Diabetes Brother    Colon cancer Neg Hx    Colon polyps Neg Hx    Esophageal cancer Neg Hx    Rectal cancer Neg Hx    Stomach cancer Neg Hx    Social History   Socioeconomic History   Marital status: Divorced    Spouse name: Not on file   Number of children: Not on file   Years of education: Not on file   Highest education level: Not on file  Occupational History   Occupation: disable    Employer: DISABLED  Tobacco Use   Smoking status: Never   Smokeless tobacco: Never  Vaping Use   Vaping Use: Never used  Substance and Sexual Activity   Alcohol use: No   Drug use: No   Sexual activity: Not on file    Comment: second hand smoke  Other Topics Concern   Not on  file  Social History Narrative   Has one child lives at home with her.    Social Determinants of Health   Financial Resource Strain: Not on file  Food Insecurity: Not on file  Transportation Needs: Not on file  Physical Activity: Not on file  Stress: Not on file  Social Connections: Not on file   Past Surgical History:  Procedure Laterality Date   ABDOMINAL HYSTERECTOMY  1987   W/ [place] DECOMP/DISCECTOMY FUSION N/A 08/09/2012   Procedure: ANTERIOR CERVICAL DECOMPRESSION/DISCECTOMY FUSION 1 LEVEL;  Surgeon: Floyce Stakes, MD;  [facility]: [facility];  Service: Neurosurgery;  Laterality: N/A;  Cervical four-five  Anterior cervical decompression/diskectomy/fusion   ANTERIOR FUSION CERVICAL SPINE  2007   C5 -6   APPENDECTOMY     APPLICATION OF INTRAOPERATIVE CT SCAN N/A 05/17/2017   Procedure: APPLICATION OF INTRAOPERATIVE CT SCAN;  Surgeon: Erline Levine, MD;  Location: [place];  Service: Neurosurgery;  Laterality: N/A;   BACK SURGERY     x 5   BREAST EXCISIONAL BIOPSY Bilateral    No scar seen    BREAST SURGERY     x 2 biopsies   CARPAL TUNNEL RELEASE  RIGHT - 2001  & DEC 2011  W/ BACK SURG.   CERVICAL DISC SURGERY  2005   C5 - 6   CHOLECYSTECTOMY  1994   DORSAL COMPARTMENT RELEASE Right 04/07/2013   Procedure: RIGHT WRIST STS RELEASE;  Surgeon: Schuyler Amor, MD;  Location: [place];  Service: Orthopedics;  Laterality: Right;   ENDOVENOUS ABLATION SAPHENOUS VEIN W/ LASER Right 05/29/2019   endovenous laser ablation right greater saphenous vein by Gae Gallop MD    EXPLORATION OF INCISION FOR CSF LEAK  DEC 2011  X2   POST [place] Right 04/07/2013   Procedure: RIGHT INDEX AND RIGHT LONG DISTAL INTERPHALANGEAL JOINT FUSIONS;  Surgeon: Schuyler Amor, MD;  Location: [employer];  Service: Orthopedics;  Laterality: Right;   FINGER ARTHROPLASTY Right 04/07/2013   Procedure: RIGHT THUMB [place]  ARTHROPLASTY;  Surgeon: Schuyler Amor, MD;  Location: [place];  Service: Orthopedics;  Laterality: Right;   GANGLION CYST EXCISION Right 04/07/2013   Procedure: RIGHT WRIST MASS EXCISION;  Surgeon: Schuyler Amor, MD;  Location: [place];  Service: Orthopedics;  Laterality: Right;   KNEE ARTHROSCOPY  LEFT X3 (LAST ONE 2005)   KNEE ARTHROSCOPY  05/17/2011   Procedure: ARTHROSCOPY KNEE;  Surgeon: Bradley Ferris III;  Location: [place];  Service: Orthopedics;  Laterality: Right;  WITH MEDIAL MENISECTOMY AND removal of suprapatella fat lump   LEFT WRIST TENOSYNECTOMY W/ LEFT THUMB JOINT REPAIR  02-24-10   LUMBAR FUSION  11-30-10   L4 - 5   LUMBAR FUSION  2000   L2 - 4   LUMBAR LAMINECTOMY  DEC 2011   L4 - 5   POSTERIOR CERVICAL FUSION/FORAMINOTOMY N/A 05/17/2017   Procedure: Cervical five  to Thorastic one  Posterior cervical fusion with lateral mass screws/revision of prior instrumentation;  Surgeon: Erline Levine, MD;  Location: [place];  Service: Neurosurgery;  Laterality: N/A;  C5 to T1 Posterior cervical fusion with lateral mass screws/revision of prior instrumentation   TENDON REPAIR  JAN 2010   LEFT INDEX AND LONG FINGERS   THUMB SURGERY   04/07/2019   RIGHT THUMB   TUBAL LIGATION     Past Surgical History:  Procedure Laterality Date   ABDOMINAL HYSTERECTOMY  1987   W/ BSO   ANTERIOR CERVICAL DECOMP/DISCECTOMY FUSION N/A 08/09/2012   Procedure: ANTERIOR CERVICAL DECOMPRESSION/DISCECTOMY FUSION 1 LEVEL;  Surgeon: Floyce Stakes, MD;  [facility];  Service: Neurosurgery;  Laterality: N/A;  Cervical four-five  Anterior cervical decompression/diskectomy/fusion   ANTERIOR FUSION CERVICAL SPINE  2007   C5 -6   APPENDECTOMY     APPLICATION OF INTRAOPERATIVE CT SCAN N/A 05/17/2017   Procedure: APPLICATION OF INTRAOPERATIVE CT SCAN;  Surgeon: Erline Levine, MD;  Location: [place];  Service: Neurosurgery;  Laterality: N/A;    BACK SURGERY     x 5   BREAST EXCISIONAL BIOPSY Bilateral    No scar seen    BREAST SURGERY     x 2 biopsies   CARPAL TUNNEL RELEASE  RIGHT - 2001  & DEC 2011 W/ BACK SURG.   CERVICAL DISC SURGERY  2005   C5 - 6   CHOLECYSTECTOMY  1994   DORSAL COMPARTMENT RELEASE Right 04/07/2013   Procedure: RIGHT WRIST STS RELEASE;  Surgeon: Schuyler Amor, MD;  Location: [place];  Service: Orthopedics;  Laterality: Right;   ENDOVENOUS ABLATION SAPHENOUS VEIN W/ LASER Right 05/29/2019   endovenous laser ablation right  greater saphenous vein by Gae Gallop MD    EXPLORATION OF INCISION FOR CSF LEAK  [place] 2011  X2   POST [place] Right 04/07/2013   Procedure: RIGHT INDEX AND RIGHT LONG DISTAL INTERPHALANGEAL JOINT FUSIONS;  Surgeon: Schuyler Amor, MD;  Location: [place];  Service: Orthopedics;  Laterality: Right;   FINGER ARTHROPLASTY Right 04/07/2013   Procedure: RIGHT THUMB [place] ARTHROPLASTY;  Surgeon: Schuyler Amor, MD;  Location: [place];  Service: Orthopedics;  Laterality: Right;   GANGLION CYST EXCISION Right 04/07/2013   Procedure: RIGHT WRIST MASS EXCISION;  Surgeon: Schuyler Amor, MD;  Location: [place];  Service: Orthopedics;  Laterality: Right;   KNEE ARTHROSCOPY  LEFT X3 (LAST ONE 2005)   KNEE ARTHROSCOPY  05/17/2011   Procedure: ARTHROSCOPY KNEE;  Surgeon: Bradley Ferris III;  Location: [place];  Service: Orthopedics;  Laterality: Right;  WITH MEDIAL MENISECTOMY AND removal of suprapatella fat lump   LEFT WRIST TENOSYNECTOMY W/ LEFT THUMB JOINT REPAIR  02-24-10   LUMBAR FUSION  11-30-10   L4 - 5   LUMBAR FUSION  2000   L2 - 4   LUMBAR LAMINECTOMY  DEC 2011   L4 - 5   POSTERIOR CERVICAL FUSION/FORAMINOTOMY N/A 05/17/2017   Procedure: Cervical five  to Thorastic one  Posterior cervical fusion with lateral mass screws/revision of prior instrumentation;   Surgeon: Erline Levine, MD;  Location: [place];  Service: Neurosurgery;  Laterality: N/A;  C5 to T1 Posterior cervical fusion with lateral mass screws/revision of prior instrumentation   TENDON REPAIR  JAN 2010   LEFT INDEX AND LONG FINGERS   THUMB SURGERY   04/07/2019   RIGHT THUMB   TUBAL LIGATION     Past Medical History:  Diagnosis Date   Acute sinusitis, unspecified    Allergy    Anemia    "quite a few times"   Anxiety state, unspecified    Arachnoiditis BILATERAL LEGS   DUE TO MULTIPLE BACK SURG.'S   Arthritis    Asthma    last flare up was 03/2017 lasted over a month   Blood transfusion    Cancer ([place])    skin cancers (in scalp)   Cardiomyopathy HX --06/2010   EF was 25% during acute illness (PHELONEPHRITIS) Repeat echo 12-06-10 60% showed normal EF.    Chronic back pain greater than 3 months duration    S/P BACK SURG'S   CSF leak    Diabetes mellitus without complication ([place])    dx 2013 type 2   Dyslipidemia    Dysphagia    some post op cerv fusion 2/14   Dysrhythmia    Essential hypertension, benign    Family history of adverse reaction to anesthesia    mother gets n/v   GERD (gastroesophageal reflux disease) AND HIATIAL HERNIA   CONTROLLED W/ NEXIUM   Headache(784.0)    History of chronic bronchitis    Hx of bladder infections    Hyperlipidemia    TAKE CHLOSTEROL MEDICATION,04/23/19   Hypertension    Neuromuscular disorder (HCC)    numbness and tingling   Osteoporosis    Other malaise and fatigue    PONV (postoperative nausea and vomiting)    Shortness of breath    Spinal headache    Spinal stenosis, cervical region    Varicosities    venous   Weakness of both legs DUE TO ARACHNOIDITIS   OCCASIONAL USES CANE  BP 113/75    Pulse 83    Ht 5\' 5"  (1.651 m)    Wt 162 lb 6.4 oz (73.7 kg)    SpO2 97%    BMI 27.02 kg/m   Opioid Risk Score:   Fall Risk Score:  `1  Depression screen PHQ 2/9  Depression screen [facility] 2/9 04/19/2021 02/18/2021 01/20/2021 12/22/2020  11/22/2020 10/21/2020 09/22/2020  Decreased Interest 1 0 0 1 1 0 0  Down, Depressed, Hopeless 1 0 0 1 1 0 0  PHQ - 2 Score 2 0 0 2 2 0 0  Altered sleeping - - - - 1 - -  PHQ-9 Score - - - - 3 - -  Some recent data might be hidden     Review of Systems  Musculoskeletal:  Positive for myalgias and neck pain.       Bilateral shoulder pain  All other systems reviewed and are negative.     Objective:   Physical Exam Vitals and nursing note reviewed.  Constitutional:      Appearance: Normal appearance.  Cardiovascular:     Rate and Rhythm: Normal rate and regular rhythm.     Pulses: Normal pulses.     Heart sounds: Normal heart sounds.  Pulmonary:     Effort: Pulmonary effort is normal.     Breath sounds: Normal breath sounds.  Musculoskeletal:     Cervical back: Normal range of motion.     Comments: Normal Muscle Bulk and Muscle Testing Reveals:  Upper Extremities: Full ROM and Muscle Strength 5/5  Bilateral AC Joint Tenderness Thoracic Paraspinal Tenderness: T-7-T-9 Lumbar Paraspinal Tenderness: L-3-L-5 Bilateral Greater Trochanter Tenderness Lower Extremities: Full ROM and Muscle Strength 5/ 5 Arises from Chair with ease Narrow Based Gait     Skin:    General: Skin is warm and dry.  Neurological:     Mental Status: She is alert and oriented to person, place, and time.  Psychiatric:        Mood and Affect: Mood normal.        Behavior: Behavior normal.         Assessment & Plan:  1. On 05/17/2017 :C5C6C7 T1 Posterior Cervical Fusion with lateral mass fixation with [facility] [facility], revision of prior instrumentation. By Dr. Vertell Limber.  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 Continue current medication regimen with  Gabapentin 800 mg BID. [place]: Oxycodone 15mg  one tablet 5 times a day  as needed for pain # 150 tablets 07/15/2021. We will continue the opioid monitoring program, this consists of regular clinic visits, examinations, urine drug  screen, pill counts as well as use of [place] Controlled Substance Reporting system. 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 Continue to Monitor. 06/08/2021 3. Anxiety/depression: PCP Following. Continue to monitor. 07/15/2021. 4. Muscle Spasms: Continue current medication regime with Flexeril. Continue to Monitor. 07/15/2021 5. Cervicalgia/ Cervical Radiculitis: Dr Vertell Limber Following. ,Continue current medication regime with Gabapentin:  S/P  C5C6C7 T1 Posterior Cervical Fusion with lateral mass fixation with AIRO Imaging, revision of prior instrumentation by Dr. Vertell Limber on 05/17/2017. 07/15/2021 7. Bilateral Thoracic Back Pain: Continue HEP as Tolerated and Continue current medication regimen. Continue to Monitor. 07/15/2021  8. Left lower extremity DVT/ Phlebitis:  S/P endovenous laser ablation of left great saphenous vein on 05/09/2018 by Dr. Scot Dock:  Vascular  Following. 07/15/2021. 9. Insomnia: Continue  Pamelor. Continue to Monitor. 07/15/2021. 10. Left Knee Pain: No complaints today. Ortho Following. 07/15/2021     F/U in 1 month

## 2021-07-17 ENCOUNTER — Encounter: Payer: Self-pay | Admitting: Registered Nurse

## 2021-07-18 LAB — TOXASSURE SELECT,+ANTIDEPR,UR

## 2021-07-19 ENCOUNTER — Telehealth: Payer: Self-pay | Admitting: *Deleted

## 2021-07-19 NOTE — Telephone Encounter (Signed)
Urine drug screen for this encounter is consistent for prescribed medication 

## 2021-07-26 ENCOUNTER — Encounter: Payer: Self-pay | Admitting: Orthopedic Surgery

## 2021-07-26 ENCOUNTER — Other Ambulatory Visit: Payer: Self-pay

## 2021-07-26 ENCOUNTER — Other Ambulatory Visit: Payer: Self-pay | Admitting: Orthopedic Surgery

## 2021-07-26 ENCOUNTER — Ambulatory Visit (INDEPENDENT_AMBULATORY_CARE_PROVIDER_SITE_OTHER): Payer: Medicare Other | Admitting: Orthopedic Surgery

## 2021-07-26 ENCOUNTER — Ambulatory Visit: Payer: Self-pay

## 2021-07-26 VITALS — BP 129/65 | HR 77 | Ht 65.0 in | Wt 163.0 lb

## 2021-07-26 DIAGNOSIS — M25532 Pain in left wrist: Secondary | ICD-10-CM

## 2021-07-26 DIAGNOSIS — M25531 Pain in right wrist: Secondary | ICD-10-CM | POA: Diagnosis not present

## 2021-07-26 MED ORDER — PREDNISONE 5 MG (21) PO TBPK: ORAL_TABLET | ORAL | 0 refills | Status: DC

## 2021-07-26 NOTE — Progress Notes (Signed)
Office Visit Note   Patient: [name] [name]           Date of Birth: 31-Jul-1950           MRN: [id number] Visit Date: 07/26/2021              Requested by: Neale Burly, MD [place],  [place] [id number] PCP: Neale Burly, MD   Assessment & Plan: Visit Diagnoses:   Pain in left wrist  Plan: Discussed with patient that her pain is most likely a flare of her osteoarthritis.  She has no systemic infection and no breaks or cuts in the skin that would make me suspicious for an infectious etiology.  She has no history of inflammatory arthritis.  She notes that she is been tested for gout before.  Her pain is improved but she still remains swollen.  Discussed that we will try a brace on the left wrist for some immobilization and we will put her on a steroid taper.  I can see her back in 7 to 10 days to see if she is improved.  Follow-Up Instructions: No follow-ups on file.   Orders:  No orders of the defined types were placed in this encounter.  No orders of the defined types were placed in this encounter.     Procedures: No procedures performed   Clinical Data: No additional findings.   Subjective: Chief Complaint  Patient presents with   Right Wrist - Pain    HPI  Review of Systems   Objective: Vital Signs: BP 129/65 (BP Location: Left Arm, Patient Position: Sitting)    Pulse 77    Ht 5\' 5"  (1.651 m)    Wt 163 lb (73.9 kg)    BMI 27.12 kg/m   Physical Exam Constitutional:      Appearance: Normal appearance.  Cardiovascular:     Rate and Rhythm: Normal rate.     Pulses: Normal pulses.  Pulmonary:     Effort: Pulmonary effort is normal.  Skin:    General: Skin is warm and dry.     Capillary Refill: Capillary refill takes less than 2 seconds.  Neurological:     Mental Status: She is alert.    Left Hand Exam   Tenderness  Left hand tenderness location: Mildly TTP at dorsal wrist w/ moderate swelling.   Other  Erythema:  absent Sensation: normal Pulse: present  Comments:  Minimal to no pain w/ A/PROM of wrist.  Moderate swelling but with skin wrinkles present.  No erythema.  Able to make full composite fist but limited somewhat by prior IP joint fusions.       Specialty Comments:  No specialty comments available.  Imaging: No results found.   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   Chronic cough 11/06/2012   Lung nodule 09/24/2012   Mild persistent asthma, uncomplicated 37/09/8887   Lumbar post-laminectomy syndrome 03/22/2012   Depression with anxiety 09/29/2011   Cervical post-laminectomy syndrome 08/28/2011   Arachnoiditis 08/28/2011  Cervical radicular pain 08/28/2011   Anxiety 08/28/2011   Headache    Dyspnea 12/23/2010   Hypertension    Past Medical History:  Diagnosis Date   Acute sinusitis, unspecified    Allergy    Anemia    "quite a few times"   Anxiety state, unspecified    Arachnoiditis BILATERAL LEGS   DUE TO MULTIPLE BACK SURG.'S   Arthritis    Asthma    last flare up was 03/2017 lasted over a month   Blood transfusion    Cancer ([place])    skin cancers (in scalp)   Cardiomyopathy HX --06/2010   EF was 25% during acute illness (PHELONEPHRITIS) Repeat echo 12-06-10 60% showed normal EF.    Chronic back pain greater than 3 months duration    S/P BACK SURG'S   CSF leak    Diabetes mellitus without complication ([place])    dx 2013 type 2   Dyslipidemia     Dysphagia    some post op cerv fusion 2/14   Dysrhythmia    Essential hypertension, benign    Family history of adverse reaction to anesthesia    mother gets n/v   GERD (gastroesophageal reflux disease) AND HIATIAL HERNIA   CONTROLLED W/ NEXIUM   Headache(784.0)    History of chronic bronchitis    Hx of bladder infections    Hyperlipidemia    TAKE CHLOSTEROL MEDICATION,04/23/19   Hypertension    Neuromuscular disorder (HCC)    numbness and tingling   Osteoporosis    Other malaise and fatigue    PONV (postoperative nausea and vomiting)    Shortness of breath    Spinal headache    Spinal stenosis, cervical region    Varicosities    venous   Weakness of both legs DUE TO ARACHNOIDITIS   OCCASIONAL USES CANE    Family History  Problem Relation Age of Onset   Hypertension Mother    Heart disease Mother        Mitral valve replacement.    Diabetes Mother    Cancer Father    Diabetes Brother    Colon cancer Neg Hx    Colon polyps Neg Hx    Esophageal cancer Neg Hx    Rectal cancer Neg Hx    Stomach cancer Neg Hx     Past Surgical History:  Procedure Laterality Date   ABDOMINAL HYSTERECTOMY  1987   W/ BSO   ANTERIOR CERVICAL DECOMP/DISCECTOMY FUSION N/A 08/09/2012   Procedure: ANTERIOR CERVICAL DECOMPRESSION/DISCECTOMY FUSION 1 LEVEL;  Surgeon: Floyce Stakes, MD;  [facility];  Service: Neurosurgery;  Laterality: N/A;  Cervical four-five  Anterior cervical decompression/diskectomy/fusion   ANTERIOR FUSION CERVICAL SPINE  2007   C5 -6   APPENDECTOMY     APPLICATION OF INTRAOPERATIVE CT SCAN N/A 05/17/2017   Procedure: APPLICATION OF INTRAOPERATIVE CT SCAN;  Surgeon: Erline Levine, MD;  Location: [place];  Service: Neurosurgery;  Laterality: N/A;   BACK SURGERY     x 5   BREAST EXCISIONAL BIOPSY Bilateral    No scar seen    BREAST SURGERY     x 2 biopsies   CARPAL TUNNEL RELEASE  RIGHT - 2001  & DEC 2011 W/ BACK SURG.   CERVICAL DISC SURGERY  2005   C5 - 6    CHOLECYSTECTOMY  1994   DORSAL COMPARTMENT RELEASE Right 04/07/2013   Procedure: RIGHT WRIST STS RELEASE;  Surgeon: Schuyler Amor, MD;  Location: [place];  Service: Orthopedics;  Laterality: Right;   ENDOVENOUS ABLATION SAPHENOUS VEIN W/ LASER Right 05/29/2019   endovenous laser ablation right greater saphenous vein by Gae Gallop MD    EXPLORATION OF INCISION FOR CSF LEAK  DEC 2011  X2   POST [place] Right 04/07/2013   Procedure: RIGHT INDEX AND RIGHT LONG DISTAL INTERPHALANGEAL JOINT FUSIONS;  Surgeon: Schuyler Amor, MD;  Location: [place];  Service: Orthopedics;  Laterality: Right;   FINGER ARTHROPLASTY Right 04/07/2013   Procedure: RIGHT THUMB [place] ARTHROPLASTY;  Surgeon: Schuyler Amor, MD;  Location: [place];  Service: Orthopedics;  Laterality: Right;   GANGLION CYST EXCISION Right 04/07/2013   Procedure: RIGHT WRIST MASS EXCISION;  Surgeon: Schuyler Amor, MD;  Location: [place];  Service: Orthopedics;  Laterality: Right;   KNEE ARTHROSCOPY  LEFT X3 (LAST ONE 2005)   KNEE ARTHROSCOPY  05/17/2011   Procedure: ARTHROSCOPY KNEE;  Surgeon: Bradley Ferris III;  Location: [place];  Service: Orthopedics;  Laterality: Right;  WITH MEDIAL MENISECTOMY AND removal of suprapatella fat lump   LEFT WRIST TENOSYNECTOMY W/ LEFT THUMB JOINT REPAIR  02-24-10   LUMBAR FUSION  11-30-10   L4 - 5   LUMBAR FUSION  2000   L2 - 4   LUMBAR LAMINECTOMY  DEC 2011   L4 - 5   POSTERIOR CERVICAL FUSION/FORAMINOTOMY N/A 05/17/2017   Procedure: Cervical five  to Thorastic one  Posterior cervical fusion with lateral mass screws/revision of prior instrumentation;  Surgeon: Erline Levine, MD;  Location: [place];  Service: Neurosurgery;  Laterality: N/A;  C5 to T1 Posterior cervical fusion with lateral mass screws/revision of prior instrumentation   TENDON REPAIR  JAN 2010   LEFT INDEX  AND LONG FINGERS   THUMB SURGERY   04/07/2019   RIGHT THUMB   TUBAL LIGATION     Social History   Occupational History   Occupation: disable    Employer: DISABLED  Tobacco Use   Smoking status: Never   Smokeless tobacco: Never  Vaping Use   Vaping Use: Never used  Substance and Sexual Activity   Alcohol use: No   Drug use: No   Sexual activity: Not on file    Comment: second hand smoke

## 2021-07-28 DIAGNOSIS — Z20822 Contact with and (suspected) exposure to covid-19: Secondary | ICD-10-CM | POA: Diagnosis not present

## 2021-08-04 DIAGNOSIS — Z20822 Contact with and (suspected) exposure to covid-19: Secondary | ICD-10-CM | POA: Diagnosis not present

## 2021-08-09 ENCOUNTER — Ambulatory Visit: Payer: Medicare Other | Admitting: Orthopedic Surgery

## 2021-08-16 ENCOUNTER — Encounter: Payer: Medicare Other | Attending: Physical Medicine & Rehabilitation | Admitting: Registered Nurse

## 2021-08-16 ENCOUNTER — Encounter: Payer: Self-pay | Admitting: Registered Nurse

## 2021-08-16 ENCOUNTER — Ambulatory Visit: Payer: Medicare Other | Admitting: Orthopedic Surgery

## 2021-08-16 ENCOUNTER — Other Ambulatory Visit: Payer: Self-pay

## 2021-08-16 VITALS — BP 126/81 | HR 100 | Temp 98.0°F | Ht 65.0 in | Wt 159.0 lb

## 2021-08-16 DIAGNOSIS — M5416 Radiculopathy, lumbar region: Secondary | ICD-10-CM | POA: Diagnosis not present

## 2021-08-16 DIAGNOSIS — G894 Chronic pain syndrome: Secondary | ICD-10-CM

## 2021-08-16 DIAGNOSIS — M5412 Radiculopathy, cervical region: Secondary | ICD-10-CM

## 2021-08-16 DIAGNOSIS — R519 Headache, unspecified: Secondary | ICD-10-CM | POA: Diagnosis not present

## 2021-08-16 DIAGNOSIS — M546 Pain in thoracic spine: Secondary | ICD-10-CM | POA: Diagnosis not present

## 2021-08-16 DIAGNOSIS — Z5181 Encounter for therapeutic drug level monitoring: Secondary | ICD-10-CM | POA: Diagnosis not present

## 2021-08-16 DIAGNOSIS — M542 Cervicalgia: Secondary | ICD-10-CM | POA: Diagnosis not present

## 2021-08-16 DIAGNOSIS — G8929 Other chronic pain: Secondary | ICD-10-CM

## 2021-08-16 DIAGNOSIS — M961 Postlaminectomy syndrome, not elsewhere classified: Secondary | ICD-10-CM | POA: Diagnosis not present

## 2021-08-16 MED ORDER — OXYCODONE HCL 15 MG PO TABS: 15.0000 mg | ORAL_TABLET | Freq: Every day | ORAL | 0 refills | Status: DC | PRN

## 2021-08-16 NOTE — Progress Notes (Signed)
Subjective:    Patient ID: [name] [name], female    DOB: [birth year], 71 y.o.   MRN: [id number]  HPI: [name] [name] is a 71 y.o. female who returns for follow up appointment for chronic pain and medication refill. She states her pain is located in her neck radiating into her bilateral shoulders, mid- lower back radiating into her bilateral lower extremities.  She also reports she has a headache, she is compliant with her medication. She rates her pain 7. Her current exercise regime is walking and performing stretching exercises.  [name] Morphine equivalent is 112.50 MME.   Last UDS was Performed on 07/15/2021, it was consistent.      Pain Inventory Average Pain 7 Pain Right Now 7 My pain is intermittent, constant, sharp, stabbing, tingling, and aching  In the last 24 hours, has pain interfered with the following? General activity 7 Relation with others 7 Enjoyment of life 7 What TIME of day is your pain at its worst? morning , daytime, evening, night, and varies Sleep (in general) Fair  Pain is worse with: walking, bending, inactivity, standing, and some activites Pain improves with: rest, pacing activities, medication, and heat Relief from Meds: 7      Family History  Problem Relation Age of Onset   Hypertension Mother    Heart disease Mother        Mitral valve replacement.    Diabetes Mother    Cancer Father    Diabetes Brother    Colon cancer Neg Hx    Colon polyps Neg Hx    Esophageal cancer Neg Hx    Rectal cancer Neg Hx    Stomach cancer Neg Hx    Social History   Socioeconomic History   Marital status: Divorced    Spouse name: Not on file   Number of children: Not on file   Years of education: Not on file   Highest education level: Not on file  Occupational History   Occupation: disable    Employer: DISABLED  Tobacco Use   Smoking status: Never   Smokeless tobacco: Never  Vaping Use   Vaping Use: Never used  Substance and Sexual Activity    Alcohol use: No   Drug use: No   Sexual activity: Not on file    Comment: second hand smoke  Other Topics Concern   Not on file  Social History Narrative   Has one child lives at home with her.    Social Determinants of Health   Financial Resource Strain: Not on file  Food Insecurity: Not on file  Transportation Needs: Not on file  Physical Activity: Not on file  Stress: Not on file  Social Connections: Not on file   Past Surgical History:  Procedure Laterality Date   ABDOMINAL HYSTERECTOMY  1987   W/ [place] DECOMP/DISCECTOMY FUSION N/A 08/09/2012   Procedure: ANTERIOR CERVICAL DECOMPRESSION/DISCECTOMY FUSION 1 LEVEL;  Surgeon: Floyce Stakes, MD;  Location: [facility];  Service: Neurosurgery;  Laterality: N/A;  Cervical four-five  Anterior cervical decompression/diskectomy/fusion   ANTERIOR FUSION CERVICAL SPINE  2007   C5 -6   APPENDECTOMY     APPLICATION OF INTRAOPERATIVE CT SCAN N/A 05/17/2017   Procedure: APPLICATION OF INTRAOPERATIVE CT SCAN;  Surgeon: Erline Levine, MD;  Location: [facility];  Service: Neurosurgery;  Laterality: N/A;   BACK SURGERY     x 5   BREAST EXCISIONAL BIOPSY Bilateral    No scar seen  BREAST SURGERY     x 2 biopsies   CARPAL TUNNEL RELEASE  RIGHT - 2001  & DEC 2011 W/ BACK SURG.   CERVICAL DISC SURGERY  2005   C5 - 6   CHOLECYSTECTOMY  1994   DORSAL COMPARTMENT RELEASE Right 04/07/2013   Procedure: RIGHT WRIST STS RELEASE;  Surgeon: Schuyler Amor, MD;  Location: [place];  Service: Orthopedics;  Laterality: Right;   ENDOVENOUS ABLATION SAPHENOUS VEIN W/ LASER Right 05/29/2019   endovenous laser ablation right greater saphenous vein by Gae Gallop MD    EXPLORATION OF INCISION FOR CSF LEAK  DEC 2011  X2   POST [place] Right 04/07/2013   Procedure: RIGHT INDEX AND RIGHT LONG DISTAL INTERPHALANGEAL JOINT FUSIONS;  Surgeon: Schuyler Amor, MD;  Location: [place];  Service: Orthopedics;  Laterality: Right;   FINGER ARTHROPLASTY Right 04/07/2013   Procedure: RIGHT THUMB [place] ARTHROPLASTY;  Surgeon: Schuyler Amor, MD;  Location: [place];  Service: Orthopedics;  Laterality: Right;   GANGLION CYST EXCISION Right 04/07/2013   Procedure: RIGHT WRIST MASS EXCISION;  Surgeon: Schuyler Amor, MD;  Location: [place];  Service: Orthopedics;  Laterality: Right;   KNEE ARTHROSCOPY  LEFT X3 (LAST ONE 2005)   KNEE ARTHROSCOPY  05/17/2011   Procedure: ARTHROSCOPY KNEE;  Surgeon: Bradley Ferris III;  Location: [facility];  Service: Orthopedics;  Laterality: Right;  WITH MEDIAL MENISECTOMY AND removal of suprapatella fat lump   LEFT WRIST TENOSYNECTOMY W/ LEFT THUMB JOINT REPAIR  02-24-10   LUMBAR FUSION  11-30-10   L4 - 5   LUMBAR FUSION  2000   L2 - 4   LUMBAR LAMINECTOMY  DEC 2011   L4 - 5   POSTERIOR CERVICAL FUSION/FORAMINOTOMY N/A 05/17/2017   Procedure: Cervical five  to Thorastic one  Posterior cervical fusion with lateral mass screws/revision of prior instrumentation;  Surgeon: Erline Levine, MD;  Location: [facility];  Service: Neurosurgery;  Laterality: N/A;  C5 to T1 Posterior cervical fusion with lateral mass screws/revision of prior instrumentation   TENDON REPAIR  JAN 2010   LEFT INDEX AND LONG FINGERS   THUMB SURGERY   04/07/2019   RIGHT THUMB   TUBAL LIGATION     Past Medical History:  Diagnosis Date   Acute sinusitis, unspecified    Allergy    Anemia    "quite a few times"   Anxiety state, unspecified    Arachnoiditis BILATERAL LEGS   DUE TO MULTIPLE BACK SURG.'S   Arthritis    Asthma    last flare up was 03/2017 lasted over a month   Blood transfusion    Cancer ([place])    skin cancers (in scalp)   Cardiomyopathy HX --06/2010   EF was 25% during acute illness (PHELONEPHRITIS) Repeat echo 12-06-10 60% showed normal EF.    Chronic back pain greater than 3 months duration    S/P  BACK SURG'S   CSF leak    Diabetes mellitus without complication ([place])    dx 2013 type 2   Dyslipidemia    Dysphagia    some post op cerv fusion 2/14   Dysrhythmia    Essential hypertension, benign    Family history of adverse reaction to anesthesia    mother gets n/v   GERD (gastroesophageal reflux disease) AND HIATIAL HERNIA   CONTROLLED W/ NEXIUM   Headache(784.0)    History of chronic  bronchitis    Hx of bladder infections    Hyperlipidemia    TAKE CHLOSTEROL MEDICATION,04/23/19   Hypertension    Neuromuscular disorder (HCC)    numbness and tingling   Osteoporosis    Other malaise and fatigue    PONV (postoperative nausea and vomiting)    Shortness of breath    Spinal headache    Spinal stenosis, cervical region    Varicosities    venous   Weakness of both legs DUE TO ARACHNOIDITIS   OCCASIONAL USES CANE   There were no vitals taken for this visit.  Opioid Risk Score:   Fall Risk Score:  `1  Depression screen PHQ 2/9  Depression screen [facility] 2/9 04/19/2021 02/18/2021 01/20/2021 12/22/2020 11/22/2020 10/21/2020 09/22/2020  Decreased Interest 1 0 0 1 1 0 0  Down, Depressed, Hopeless 1 0 0 1 1 0 0  PHQ - 2 Score 2 0 0 2 2 0 0  Altered sleeping - - - - 1 - -  PHQ-9 Score - - - - 3 - -  Some recent data might be hidden    Review of Systems  Musculoskeletal:  Positive for arthralgias, back pain, gait problem and neck pain.  Neurological:  Positive for headaches.  All other systems reviewed and are negative.     Objective:   Physical Exam Vitals and nursing note reviewed.  Constitutional:      Appearance: Normal appearance.  Neck:     Comments: Cervical Paraspinal Tenderness: C-5-C-6  Cardiovascular:     Rate and Rhythm: Normal rate and regular rhythm.     Pulses: Normal pulses.     Heart sounds: Normal heart sounds.  Pulmonary:     Effort: Pulmonary effort is normal.     Breath sounds: Normal breath sounds.  Musculoskeletal:     Cervical back: Normal range of  motion and neck supple.     Comments: Normal Muscle Bulk and Muscle Testing Reveals:  Upper Extremities: Full ROM and Muscle Strength 5/5 Bilateral AC Joint Tenderness  Thoracic and Lumbar Hypersensitivity Lower Extremities: Full ROM and Muscle Strength 5/5 Arises from Chair with ease Narrow Based  Gait     Skin:    General: Skin is warm and dry.  Neurological:     Mental Status: She is alert and oriented to person, place, and time.  Psychiatric:        Mood and Affect: Mood normal.        Behavior: Behavior normal.         Assessment & Plan:  1. On 05/17/2017 :C5C6C7 T1 Posterior Cervical Fusion with lateral mass fixation with AIRO Imaging, revision of prior instrumentation. By Dr. Vertell Limber.  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 Continue current medication regimen with  Gabapentin 800 mg BID. [place]: Oxycodone 15mg  one tablet 5 times a day  as needed for pain # 150 tablets 07/15/2021. We will continue the opioid monitoring program, this consists of regular clinic visits, examinations, urine drug screen, pill counts as well as use of [place] Controlled Substance Reporting system. 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 Continue to Monitor. 06/08/2021 3. Anxiety/depression: PCP Following. Continue to monitor. 08/16/2021. 4. Muscle Spasms: Continue current medication regime with Flexeril. Continue to Monitor. 08/16/2021 5. Cervicalgia/ Cervical Radiculitis: Dr Vertell Limber Following. ,Continue current medication regime with Gabapentin:  S/P  C5C6C7  T1 Posterior Cervical Fusion with lateral mass fixation with AIRO Imaging, revision of prior instrumentation by Dr. Vertell Limber on 05/17/2017. 08/16/2021 7. Bilateral Thoracic Back Pain: Continue HEP as Tolerated and Continue  current medication regimen. Continue to Monitor. 08/16/2021  8. Left lower extremity DVT/ Phlebitis:  S/P endovenous laser ablation of left great saphenous vein on 05/09/2018 by Dr. Scot Dock:  Vascular  Following. 08/16/2021. 9. Insomnia: Continue Pamelor. Continue to Monitor. 08/16/2021. 10. Left Knee Pain: No complaints today. Ortho Following. 08/16/2021     F/U in 1 month

## 2021-09-03 DIAGNOSIS — Z20822 Contact with and (suspected) exposure to covid-19: Secondary | ICD-10-CM | POA: Diagnosis not present

## 2021-09-13 ENCOUNTER — Encounter: Payer: Self-pay | Admitting: Registered Nurse

## 2021-09-13 ENCOUNTER — Encounter: Payer: Medicare Other | Attending: Physical Medicine & Rehabilitation | Admitting: Registered Nurse

## 2021-09-13 ENCOUNTER — Other Ambulatory Visit: Payer: Self-pay

## 2021-09-13 VITALS — BP 125/85 | HR 72 | Ht 65.0 in | Wt 160.0 lb

## 2021-09-13 DIAGNOSIS — M5412 Radiculopathy, cervical region: Secondary | ICD-10-CM | POA: Diagnosis not present

## 2021-09-13 DIAGNOSIS — Z5181 Encounter for therapeutic drug level monitoring: Secondary | ICD-10-CM

## 2021-09-13 DIAGNOSIS — G8929 Other chronic pain: Secondary | ICD-10-CM | POA: Diagnosis not present

## 2021-09-13 DIAGNOSIS — M5416 Radiculopathy, lumbar region: Secondary | ICD-10-CM

## 2021-09-13 DIAGNOSIS — M961 Postlaminectomy syndrome, not elsewhere classified: Secondary | ICD-10-CM

## 2021-09-13 DIAGNOSIS — M546 Pain in thoracic spine: Secondary | ICD-10-CM | POA: Insufficient documentation

## 2021-09-13 DIAGNOSIS — G894 Chronic pain syndrome: Secondary | ICD-10-CM | POA: Diagnosis not present

## 2021-09-13 DIAGNOSIS — M542 Cervicalgia: Secondary | ICD-10-CM | POA: Diagnosis not present

## 2021-09-13 DIAGNOSIS — Z79891 Long term (current) use of opiate analgesic: Secondary | ICD-10-CM | POA: Diagnosis not present

## 2021-09-13 DIAGNOSIS — G039 Meningitis, unspecified: Secondary | ICD-10-CM

## 2021-09-13 MED ORDER — GABAPENTIN 800 MG PO TABS: 800.0000 mg | ORAL_TABLET | Freq: Three times a day (TID) | ORAL | 3 refills | Status: DC

## 2021-09-13 MED ORDER — OXYCODONE HCL 15 MG PO TABS: 15.0000 mg | ORAL_TABLET | Freq: Every day | ORAL | 0 refills | Status: DC | PRN

## 2021-09-13 MED ORDER — NORTRIPTYLINE HCL 25 MG PO CAPS: 25.0000 mg | ORAL_CAPSULE | Freq: Every day | ORAL | 1 refills | Status: DC

## 2021-09-13 MED ORDER — TOPIRAMATE 25 MG PO TABS: 25.0000 mg | ORAL_TABLET | Freq: Two times a day (BID) | ORAL | 1 refills | Status: DC

## 2021-09-13 NOTE — Progress Notes (Signed)
? ?Subjective:  ? ? Patient ID: [name] [name], female    DOB: 28-Mar-1951, 71 y.o.   MRN: [id number] ? ?HPI: [name] [name] is a 71 y.o. female who returns for follow up appointment for chronic pain and medication refill. She states her pain is located in her neck radiating into her bilateral shoulders, mid- lower back pain radiating into her bilateral lower extremities . She rates her pain 8. Her current exercise regime is walking and performing stretching exercises. ? ?[name] [name] Morphine equivalent is 112.50 MME.   Last UDS was Performed on 07/15/2021, it was consistent.  ?  ? ? ?Pain Inventory ?Average Pain 8 ?Pain Right Now 8 ?My pain is constant, sharp, burning, stabbing, tingling, and aching ? ?In the last 24 hours, has pain interfered with the following? ?General activity 7 ?Relation with others 7 ?Enjoyment of life 7 ?What TIME of day is your pain at its worst? morning , daytime, evening, and night ?Sleep (in general) Fair ? ?Pain is worse with: walking, bending, sitting, inactivity, standing, and some activites ?Pain improves with: rest, pacing activities, medication, and heat ?Relief from Meds: 7 ? ?Family History  ?Problem Relation Age of Onset  ?? Hypertension Mother   ?? Heart disease Mother   ?     Mitral valve replacement.   ?? Diabetes Mother   ?? Cancer Father   ?? Diabetes Brother   ?? Colon cancer Neg Hx   ?? Colon polyps Neg Hx   ?? Esophageal cancer Neg Hx   ?? Rectal cancer Neg Hx   ?? Stomach cancer Neg Hx   ? ?Social History  ? ?Socioeconomic History  ?? Marital status: Divorced  ?  Spouse name: Not on file  ?? Number of children: Not on file  ?? Years of education: Not on file  ?? Highest education level: Not on file  ?Occupational History  ?? Occupation: disable  ?  Employer: DISABLED  ?Tobacco Use  ?? Smoking status: Never  ?? Smokeless tobacco: Never  ?Vaping Use  ?? Vaping Use: Never used  ?Substance and Sexual Activity  ?? Alcohol use: No  ?? Drug use: No  ?? Sexual activity: Not on file   ?  Comment: second hand smoke  ?Other Topics Concern  ?? Not on file  ?Social History Narrative  ? Has one child lives at home with her.   ? ?Social Determinants of Health  ? ?Financial Resource Strain: Not on file  ?Food Insecurity: Not on file  ?Transportation Needs: Not on file  ?Physical Activity: Not on file  ?Stress: Not on file  ?Social Connections: Not on file  ? ?Past Surgical History:  ?Procedure Laterality Date  ?? ABDOMINAL HYSTERECTOMY  1987  ? W/ BSO  ?? ANTERIOR CERVICAL DECOMP/DISCECTOMY FUSION N/A 08/09/2012  ? Procedure: ANTERIOR CERVICAL DECOMPRESSION/DISCECTOMY FUSION 1 LEVEL;  Surgeon: Floyce Stakes, MD;  Location: [facility];  Service: Neurosurgery;  Laterality: N/A;  Cervical four-five  Anterior cervical decompression/diskectomy/fusion  ?? ANTERIOR FUSION CERVICAL SPINE  2007  ? C5 -6  ?? APPENDECTOMY    ?? APPLICATION OF INTRAOPERATIVE CT SCAN N/A 05/17/2017  ? Procedure: APPLICATION OF INTRAOPERATIVE CT SCAN;  Surgeon: Erline Levine, MD;  Location: [place];  Service: Neurosurgery;  Laterality: N/A;  ?? BACK SURGERY    ? x 5  ?? BREAST EXCISIONAL BIOPSY Bilateral   ? No scar seen   ?? BREAST SURGERY    ? x 2 biopsies  ?? CARPAL TUNNEL RELEASE  RIGHT - 2001  & DEC 2011 W/ BACK SURG.  ?? CERVICAL DISC SURGERY  2005  ? C5 - 6  ?? CHOLECYSTECTOMY  1994  ?? DORSAL COMPARTMENT RELEASE Right 04/07/2013  ? Procedure: RIGHT WRIST STS RELEASE;  Surgeon: Schuyler Amor, MD;  Location: [place];  Service: Orthopedics;  Laterality: Right;  ?? ENDOVENOUS ABLATION SAPHENOUS VEIN W/ LASER Right 05/29/2019  ? endovenous laser ablation right greater saphenous vein by Gae Gallop MD   ?? EXPLORATION OF INCISION FOR CSF LEAK  DEC 2011  X2  ? POST LAMINECOTMY  ?? FINGER ARTHRODESIS Right 04/07/2013  ? Procedure: RIGHT INDEX AND RIGHT LONG DISTAL INTERPHALANGEAL JOINT FUSIONS;  Surgeon: Schuyler Amor, MD;  Location: [place];  Service: Orthopedics;  Laterality:  Right;  ?? FINGER ARTHROPLASTY Right 04/07/2013  ? Procedure: RIGHT THUMB [place] ARTHROPLASTY;  Surgeon: Schuyler Amor, MD;  Location: [place];  Service: Orthopedics;  Laterality: Right;  ?? GANGLION CYST EXCISION Right 04/07/2013  ? Procedure: RIGHT WRIST MASS EXCISION;  Surgeon: Schuyler Amor, MD;  Location: [place];  Service: Orthopedics;  Laterality: Right;  ?? KNEE ARTHROSCOPY  LEFT X3 (LAST ONE 2005)  ?? KNEE ARTHROSCOPY  05/17/2011  ? Procedure: ARTHROSCOPY KNEE;  Surgeon: Bradley Ferris III;  Location: [place];  Service: Orthopedics;  Laterality: Right;  WITH MEDIAL MENISECTOMY AND removal of suprapatella fat lump  ?? LEFT WRIST TENOSYNECTOMY W/ LEFT THUMB JOINT REPAIR  02-24-10  ?? LUMBAR FUSION  11-30-10  ? L4 - 5  ?? LUMBAR FUSION  2000  ? L2 - 4  ?? LUMBAR LAMINECTOMY  DEC 2011  ? L4 - 5  ?? POSTERIOR CERVICAL FUSION/FORAMINOTOMY N/A 05/17/2017  ? Procedure: Cervical five  to Thorastic one  Posterior cervical fusion with lateral mass screws/revision of prior instrumentation;  Surgeon: Erline Levine, MD;  Location: [place];  Service: Neurosurgery;  Laterality: N/A;  C5 to T1 Posterior cervical fusion with lateral mass screws/revision of prior instrumentation  ?? TENDON REPAIR  JAN 2010  ? LEFT INDEX AND LONG FINGERS  ?? THUMB SURGERY   04/07/2019  ? RIGHT THUMB  ?? TUBAL LIGATION    ? ?Past Surgical History:  ?Procedure Laterality Date  ?? ABDOMINAL HYSTERECTOMY  1987  ? W/ BSO  ?? ANTERIOR CERVICAL DECOMP/DISCECTOMY FUSION N/A 08/09/2012  ? Procedure: ANTERIOR CERVICAL DECOMPRESSION/DISCECTOMY FUSION 1 LEVEL;  Surgeon: Floyce Stakes, MD;  [facility];  Service: Neurosurgery;  Laterality: N/A;  Cervical four-five  Anterior cervical decompression/diskectomy/fusion  ?? ANTERIOR FUSION CERVICAL SPINE  2007  ? C5 -6  ?? APPENDECTOMY    ?? APPLICATION OF INTRAOPERATIVE CT SCAN N/A 05/17/2017  ? Procedure: APPLICATION OF INTRAOPERATIVE CT  SCAN;  Surgeon: Erline Levine, MD;  Location: [place];  Service: Neurosurgery;  Laterality: N/A;  ?? BACK SURGERY    ? x 5  ?? BREAST EXCISIONAL BIOPSY Bilateral   ? No scar seen   ?? BREAST SURGERY    ? x 2 biopsies  ?? CARPAL TUNNEL RELEASE  RIGHT - 2001  & DEC 2011 W/ BACK SURG.  ?? CERVICAL DISC SURGERY  2005  ? C5 - 6  ?? CHOLECYSTECTOMY  1994  ?? DORSAL COMPARTMENT RELEASE Right 04/07/2013  ? Procedure: RIGHT WRIST STS RELEASE;  Surgeon: Schuyler Amor, MD;  Location: [place];  Service: Orthopedics;  Laterality: Right;  ?? ENDOVENOUS ABLATION SAPHENOUS VEIN W/ LASER Right  05/29/2019  ? endovenous laser ablation right greater saphenous vein by Gae Gallop MD   ?? EXPLORATION OF INCISION FOR CSF LEAK  DEC 2011  X2  ? POST LAMINECOTMY  ?? FINGER ARTHRODESIS Right 04/07/2013  ? Procedure: RIGHT INDEX AND RIGHT LONG DISTAL INTERPHALANGEAL JOINT FUSIONS;  Surgeon: Schuyler Amor, MD;  Location: [place];  Service: Orthopedics;  Laterality: Right;  ?? FINGER ARTHROPLASTY Right 04/07/2013  ? Procedure: RIGHT THUMB [place] ARTHROPLASTY;  Surgeon: Schuyler Amor, MD;  Location: [place];  Service: Orthopedics;  Laterality: Right;  ?? GANGLION CYST EXCISION Right 04/07/2013  ? Procedure: RIGHT WRIST MASS EXCISION;  Surgeon: Schuyler Amor, MD;  Location: [place];  Service: Orthopedics;  Laterality: Right;  ?? KNEE ARTHROSCOPY  LEFT X3 (LAST ONE 2005)  ?? KNEE ARTHROSCOPY  05/17/2011  ? Procedure: ARTHROSCOPY KNEE;  Surgeon: Bradley Ferris III;  Location: [place];  Service: Orthopedics;  Laterality: Right;  WITH MEDIAL MENISECTOMY AND removal of suprapatella fat lump  ?? LEFT WRIST TENOSYNECTOMY W/ LEFT THUMB JOINT REPAIR  02-24-10  ?? LUMBAR FUSION  11-30-10  ? L4 - 5  ?? LUMBAR FUSION  2000  ? L2 - 4  ?? LUMBAR LAMINECTOMY  DEC 2011  ? L4 - 5  ?? POSTERIOR CERVICAL FUSION/FORAMINOTOMY N/A 05/17/2017  ? Procedure:  Cervical five  to Thorastic one  Posterior cervical fusion with lateral mass screws/revision of prior instrumentation;  Surgeon: Erline Levine, MD;  Location: [place];  Service: Neurosurgery;  Laterality: N/A;  C5

## 2021-09-15 DIAGNOSIS — E782 Mixed hyperlipidemia: Secondary | ICD-10-CM | POA: Diagnosis not present

## 2021-09-15 DIAGNOSIS — I10 Essential (primary) hypertension: Secondary | ICD-10-CM | POA: Diagnosis not present

## 2021-09-15 DIAGNOSIS — E1143 Type 2 diabetes mellitus with diabetic autonomic (poly)neuropathy: Secondary | ICD-10-CM | POA: Diagnosis not present

## 2021-09-16 DIAGNOSIS — Z20822 Contact with and (suspected) exposure to covid-19: Secondary | ICD-10-CM | POA: Diagnosis not present

## 2021-09-28 DIAGNOSIS — R051 Acute cough: Secondary | ICD-10-CM | POA: Diagnosis not present

## 2021-09-28 DIAGNOSIS — R059 Cough, unspecified: Secondary | ICD-10-CM | POA: Diagnosis not present

## 2021-09-28 DIAGNOSIS — Z20822 Contact with and (suspected) exposure to covid-19: Secondary | ICD-10-CM | POA: Diagnosis not present

## 2021-10-03 DIAGNOSIS — Z20822 Contact with and (suspected) exposure to covid-19: Secondary | ICD-10-CM | POA: Diagnosis not present

## 2021-10-03 DIAGNOSIS — H26493 Other secondary cataract, bilateral: Secondary | ICD-10-CM | POA: Diagnosis not present

## 2021-10-03 DIAGNOSIS — Z961 Presence of intraocular lens: Secondary | ICD-10-CM | POA: Diagnosis not present

## 2021-10-13 ENCOUNTER — Encounter: Payer: Medicare Other | Attending: Physical Medicine & Rehabilitation | Admitting: Registered Nurse

## 2021-10-13 ENCOUNTER — Encounter: Payer: Self-pay | Admitting: Registered Nurse

## 2021-10-13 VITALS — BP 135/86 | HR 84 | Ht 65.0 in | Wt 163.4 lb

## 2021-10-13 DIAGNOSIS — G894 Chronic pain syndrome: Secondary | ICD-10-CM

## 2021-10-13 DIAGNOSIS — M545 Low back pain, unspecified: Secondary | ICD-10-CM

## 2021-10-13 DIAGNOSIS — Z79891 Long term (current) use of opiate analgesic: Secondary | ICD-10-CM | POA: Diagnosis not present

## 2021-10-13 DIAGNOSIS — M546 Pain in thoracic spine: Secondary | ICD-10-CM | POA: Diagnosis not present

## 2021-10-13 DIAGNOSIS — M5412 Radiculopathy, cervical region: Secondary | ICD-10-CM | POA: Diagnosis not present

## 2021-10-13 DIAGNOSIS — Z5181 Encounter for therapeutic drug level monitoring: Secondary | ICD-10-CM

## 2021-10-13 DIAGNOSIS — G8929 Other chronic pain: Secondary | ICD-10-CM | POA: Diagnosis not present

## 2021-10-13 DIAGNOSIS — M5416 Radiculopathy, lumbar region: Secondary | ICD-10-CM

## 2021-10-13 DIAGNOSIS — M542 Cervicalgia: Secondary | ICD-10-CM | POA: Diagnosis not present

## 2021-10-13 DIAGNOSIS — M961 Postlaminectomy syndrome, not elsewhere classified: Secondary | ICD-10-CM

## 2021-10-13 MED ORDER — OXYCODONE HCL 15 MG PO TABS: 15.0000 mg | ORAL_TABLET | Freq: Every day | ORAL | 0 refills | Status: DC | PRN

## 2021-10-13 NOTE — Progress Notes (Signed)
? ?Subjective:  ? ? Patient ID: [name] [name], female    DOB: Jul 06, 1950, 71 y.o.   MRN: [id number] ? ?HPI: [name] [name] is a 71 y.o. female who returns for follow up appointment for chronic pain and medication refill. She states her pain is located in her neck radiating into her bilateral shoulders and lower back pain radiating into her left lower extremity. She also reports increase intensity in her mid- lower back pain, we will order X-rays today, she verbalizes understanding. She rates her pain 8. Her current exercise regime is walking. ? ?Ms. Lipkin Morphine equivalent is 112.50 MME.   Last UDS was Performed on 07/15/2021, it was consistent.  ?  ? ?Pain Inventory ?Average Pain 8 ?Pain Right Now 8 ?My pain is sharp, burning, stabbing, tingling, aching, and stinging ? ?In the last 24 hours, has pain interfered with the following? ?General activity 8 ?Relation with others 8 ?Enjoyment of life 8 ?What TIME of day is your pain at its worst? morning , daytime, evening, and night ?Sleep (in general) Fair ? ?Pain is worse with: walking, bending, sitting, inactivity, standing, and some activites ?Pain improves with: rest, heat/ice, pacing activities, and medication ?Relief from Meds: 7 ? ?Family History  ?Problem Relation Age of Onset  ? Hypertension Mother   ? Heart disease Mother   ?     Mitral valve replacement.   ? Diabetes Mother   ? Cancer Father   ? Diabetes Brother   ? Colon cancer Neg Hx   ? Colon polyps Neg Hx   ? Esophageal cancer Neg Hx   ? Rectal cancer Neg Hx   ? Stomach cancer Neg Hx   ? ?Social History  ? ?Socioeconomic History  ? Marital status: Divorced  ?  Spouse name: Not on file  ? Number of children: Not on file  ? Years of education: Not on file  ? Highest education level: Not on file  ?Occupational History  ? Occupation: disable  ?  Employer: DISABLED  ?Tobacco Use  ? Smoking status: Never  ? Smokeless tobacco: Never  ?Vaping Use  ? Vaping Use: Never used  ?Substance and Sexual Activity  ?  Alcohol use: No  ? Drug use: No  ? Sexual activity: Not on file  ?  Comment: second hand smoke  ?Other Topics Concern  ? Not on file  ?Social History Narrative  ? Has one child lives at home with her.   ? ?Social Determinants of Health  ? ?Financial Resource Strain: Not on file  ?Food Insecurity: Not on file  ?Transportation Needs: Not on file  ?Physical Activity: Not on file  ?Stress: Not on file  ?Social Connections: Not on file  ? ?Past Surgical History:  ?Procedure Laterality Date  ? ABDOMINAL HYSTERECTOMY  1987  ? W/ BSO  ? ANTERIOR CERVICAL DECOMP/DISCECTOMY FUSION N/A 08/09/2012  ? Procedure: ANTERIOR CERVICAL DECOMPRESSION/DISCECTOMY FUSION 1 LEVEL;  Surgeon: Floyce Stakes, MD;  [facility];  Service: Neurosurgery;  Laterality: N/A;  Cervical four-five  Anterior cervical decompression/diskectomy/fusion  ? ANTERIOR FUSION CERVICAL SPINE  2007  ? C5 -6  ? APPENDECTOMY    ? APPLICATION OF INTRAOPERATIVE CT SCAN N/A 05/17/2017  ? Procedure: APPLICATION OF INTRAOPERATIVE CT SCAN;  Surgeon: Erline Levine, MD;  Location: [place];  Service: Neurosurgery;  Laterality: N/A;  ? BACK SURGERY    ? x 5  ? BREAST EXCISIONAL BIOPSY Bilateral   ? No scar seen   ? BREAST  SURGERY    ? x 2 biopsies  ? CARPAL TUNNEL RELEASE  RIGHT - 2001  & DEC 2011 W/ BACK SURG.  ? [place] SURGERY  2005  ? C5 - 6  ? CHOLECYSTECTOMY  1994  ? DORSAL COMPARTMENT RELEASE Right 04/07/2013  ? Procedure: RIGHT WRIST STS RELEASE;  Surgeon: Schuyler Amor, MD;  Location: [place];  Service: Orthopedics;  Laterality: Right;  ? ENDOVENOUS ABLATION SAPHENOUS VEIN W/ LASER Right 05/29/2019  ? endovenous laser ablation right greater saphenous vein by Gae Gallop MD   ? EXPLORATION OF INCISION FOR CSF LEAK  DEC 2011  X2  ? POST LAMINECOTMY  ? FINGER ARTHRODESIS Right 04/07/2013  ? Procedure: RIGHT INDEX AND RIGHT LONG DISTAL INTERPHALANGEAL JOINT FUSIONS;  Surgeon: Schuyler Amor, MD;  Location: [place];  Service: Orthopedics;  Laterality: Right;  ? FINGER ARTHROPLASTY Right 04/07/2013  ? Procedure: RIGHT THUMB [place] ARTHROPLASTY;  Surgeon: Schuyler Amor, MD;  Location: [place];  Service: Orthopedics;  Laterality: Right;  ? GANGLION CYST EXCISION Right 04/07/2013  ? Procedure: RIGHT WRIST MASS EXCISION;  Surgeon: Schuyler Amor, MD;  Location: [place];  Service: Orthopedics;  Laterality: Right;  ? KNEE ARTHROSCOPY  LEFT X3 (LAST ONE 2005)  ? KNEE ARTHROSCOPY  05/17/2011  ? Procedure: ARTHROSCOPY KNEE;  Surgeon: Bradley Ferris III;  Location: [place];  Service: Orthopedics;  Laterality: Right;  WITH MEDIAL MENISECTOMY AND removal of suprapatella fat lump  ? LEFT WRIST TENOSYNECTOMY W/ LEFT THUMB JOINT REPAIR  02-24-10  ? LUMBAR FUSION  11-30-10  ? L4 - 5  ? LUMBAR FUSION  2000  ? L2 - 4  ? LUMBAR LAMINECTOMY  DEC 2011  ? L4 - 5  ? POSTERIOR CERVICAL FUSION/FORAMINOTOMY N/A 05/17/2017  ? Procedure: Cervical five  to Thorastic one  Posterior cervical fusion with lateral mass screws/revision of prior instrumentation;  Surgeon: Erline Levine, MD;  Location: [place];  Service: Neurosurgery;  Laterality: N/A;  C5 to T1 Posterior cervical fusion with lateral mass screws/revision of prior instrumentation  ? TENDON REPAIR  JAN 2010  ? LEFT INDEX AND LONG FINGERS  ? THUMB SURGERY   04/07/2019  ? RIGHT THUMB  ? TUBAL LIGATION    ? ?Past Surgical History:  ?Procedure Laterality Date  ? ABDOMINAL HYSTERECTOMY  1987  ? W/ BSO  ? ANTERIOR CERVICAL DECOMP/DISCECTOMY FUSION N/A 08/09/2012  ? Procedure: ANTERIOR CERVICAL DECOMPRESSION/DISCECTOMY FUSION 1 LEVEL;  Surgeon: Floyce Stakes, MD;  [facility];  Service: Neurosurgery;  Laterality: N/A;  Cervical four-five  Anterior cervical decompression/diskectomy/fusion  ? ANTERIOR FUSION CERVICAL SPINE  2007  ? C5 -6  ? APPENDECTOMY    ? APPLICATION OF INTRAOPERATIVE CT SCAN N/A 05/17/2017  ? Procedure:  APPLICATION OF INTRAOPERATIVE CT SCAN;  Surgeon: Erline Levine, MD;  Location: [place];  Service: Neurosurgery;  Laterality: N/A;  ? BACK SURGERY    ? x 5  ? BREAST EXCISIONAL BIOPSY Bilateral   ? No scar seen   ? BREAST SURGERY    ? x 2 biopsies  ? CARPAL TUNNEL RELEASE  RIGHT - 2001  & DEC 2011 W/ BACK SURG.  ? [place] SURGERY  2005  ? C5 - 6  ? CHOLECYSTECTOMY  1994  ? DORSAL COMPARTMENT RELEASE Right 04/07/2013  ? Procedure: RIGHT WRIST STS RELEASE;  Surgeon: Schuyler Amor, MD;  Location: [place];  Service: Orthopedics;  Laterality: Right;  ? ENDOVENOUS ABLATION SAPHENOUS VEIN W/ LASER Right 05/29/2019  ? endovenous laser ablation right greater saphenous vein by Gae Gallop MD   ? EXPLORATION OF INCISION FOR CSF LEAK  DEC 2011  X2  ? POST LAMINECOTMY  ? FINGER ARTHRODESIS Right 04/07/2013  ? Procedure: RIGHT INDEX AND RIGHT LONG DISTAL INTERPHALANGEAL JOINT FUSIONS;  Surgeon: Schuyler Amor, MD;  Location: [place];  Service: Orthopedics;  Laterality: Right;  ? FINGER ARTHROPLASTY Right 04/07/2013  ? Procedure: RIGHT THUMB [place] ARTHROPLASTY;  Surgeon: Schuyler Amor, MD;  Location: [place];  Service: Orthopedics;  Laterality: Right;  ? GANGLION CYST EXCISION Right 04/07/2013  ? Procedure: RIGHT WRIST MASS EXCISION;  Surgeon: Schuyler Amor, MD;  Location: [place];  Service: Orthopedics;  Laterality: Right;  ? KNEE ARTHROSCOPY  LEFT X3 (LAST ONE 2005)  ? KNEE ARTHROSCOPY  05/17/2011  ? Procedure: ARTHROSCOPY KNEE;  Surgeon: Bradley Ferris III;  Location: [place];  Service: Orthopedics;  Laterality: Right;  WITH MEDIAL MENISECTOMY AND removal of suprapatella fat lump  ? LEFT WRIST TENOSYNECTOMY W/ LEFT THUMB JOINT REPAIR  02-24-10  ? LUMBAR FUSION  11-30-10  ? L4 - 5  ? LUMBAR FUSION  2000  ? L2 - 4  ? LUMBAR LAMINECTOMY  DEC 2011  ? L4 - 5  ? POSTERIOR CERVICAL FUSION/FORAMINOTOMY N/A 05/17/2017  ?  Procedure: Cervical five  to Thorastic one  Posterior cervical fusion with lateral mass screws/revision of prior instrumentation;  Surgeon: Erline Levine, MD;  Location: [place];  Service: Neurosurgery;  Laterality: N/A

## 2021-10-15 ENCOUNTER — Other Ambulatory Visit: Payer: Self-pay | Admitting: Registered Nurse

## 2021-10-15 DIAGNOSIS — M961 Postlaminectomy syndrome, not elsewhere classified: Secondary | ICD-10-CM

## 2021-10-15 DIAGNOSIS — G039 Meningitis, unspecified: Secondary | ICD-10-CM

## 2021-10-18 ENCOUNTER — Ambulatory Visit
Admission: RE | Admit: 2021-10-18 | Discharge: 2021-10-18 | Disposition: A | Discharge status: Home / Self Care | Payer: Medicare Other | Source: Ambulatory Visit | Attending: Registered Nurse | Admitting: Registered Nurse

## 2021-10-18 DIAGNOSIS — M25511 Pain in right shoulder: Secondary | ICD-10-CM | POA: Diagnosis not present

## 2021-10-18 DIAGNOSIS — M47814 Spondylosis without myelopathy or radiculopathy, thoracic region: Secondary | ICD-10-CM | POA: Diagnosis not present

## 2021-10-18 DIAGNOSIS — R531 Weakness: Secondary | ICD-10-CM | POA: Diagnosis not present

## 2021-10-18 DIAGNOSIS — M25512 Pain in left shoulder: Secondary | ICD-10-CM | POA: Diagnosis not present

## 2021-10-18 DIAGNOSIS — M545 Low back pain, unspecified: Secondary | ICD-10-CM | POA: Diagnosis not present

## 2021-10-22 DIAGNOSIS — Z20822 Contact with and (suspected) exposure to covid-19: Secondary | ICD-10-CM | POA: Diagnosis not present

## 2021-10-24 DIAGNOSIS — Z20822 Contact with and (suspected) exposure to covid-19: Secondary | ICD-10-CM | POA: Diagnosis not present

## 2021-11-11 ENCOUNTER — Ambulatory Visit: Payer: Medicare Other | Admitting: Registered Nurse

## 2021-11-16 ENCOUNTER — Encounter: Payer: Medicare Other | Attending: Physical Medicine & Rehabilitation | Admitting: Registered Nurse

## 2021-11-16 ENCOUNTER — Encounter: Payer: Self-pay | Admitting: Registered Nurse

## 2021-11-16 VITALS — BP 126/78 | HR 81 | Ht 65.0 in | Wt 165.6 lb

## 2021-11-16 DIAGNOSIS — G894 Chronic pain syndrome: Secondary | ICD-10-CM | POA: Diagnosis not present

## 2021-11-16 DIAGNOSIS — M5412 Radiculopathy, cervical region: Secondary | ICD-10-CM

## 2021-11-16 DIAGNOSIS — G039 Meningitis, unspecified: Secondary | ICD-10-CM

## 2021-11-16 DIAGNOSIS — M5416 Radiculopathy, lumbar region: Secondary | ICD-10-CM | POA: Diagnosis not present

## 2021-11-16 DIAGNOSIS — M961 Postlaminectomy syndrome, not elsewhere classified: Secondary | ICD-10-CM

## 2021-11-16 DIAGNOSIS — M542 Cervicalgia: Secondary | ICD-10-CM

## 2021-11-16 DIAGNOSIS — Z5181 Encounter for therapeutic drug level monitoring: Secondary | ICD-10-CM | POA: Diagnosis not present

## 2021-11-16 DIAGNOSIS — Z79891 Long term (current) use of opiate analgesic: Secondary | ICD-10-CM | POA: Diagnosis not present

## 2021-11-16 MED ORDER — OXYCODONE HCL 15 MG PO TABS: 15.0000 mg | ORAL_TABLET | Freq: Every day | ORAL | 0 refills | Status: DC | PRN

## 2021-11-16 MED ORDER — GABAPENTIN 800 MG PO TABS: 800.0000 mg | ORAL_TABLET | Freq: Three times a day (TID) | ORAL | 3 refills | Status: DC

## 2021-11-16 NOTE — Progress Notes (Signed)
Subjective:    Patient ID: [name] [name], female    DOB: December 31, 1950, 71 y.o.   MRN: [id number]  HPI: [name] [name] is a 71 y.o. female who returns for follow up appointment for chronic pain and medication refill. She states her pain is located in her neck radiating into her bilateral shoulders, and lower back pain radiating into her left lower extremity. She rates her pain 8. Her current exercise regime is walking and performing stretching exercises.   Ms. [place] Morphine equivalent is 112.50  MME.   UDS ordered today.    Pain Inventory Average Pain 7 Pain Right Now 8 My pain is sharp, burning, stabbing, tingling, and aching  In the last 24 hours, has pain interfered with the following? General activity 7 Relation with others 7 Enjoyment of life 7 What TIME of day is your pain at its worst? morning , daytime, and evening Sleep (in general) Fair  Pain is worse with: walking, bending, sitting, inactivity, standing, and some activites Pain improves with: rest, heat/ice, pacing activities, and medication Relief from Meds: 7  Family History  Problem Relation Age of Onset   Hypertension Mother    Heart disease Mother        Mitral valve replacement.    Diabetes Mother    Cancer Father    Diabetes Brother    Colon cancer Neg Hx    Colon polyps Neg Hx    Esophageal cancer Neg Hx    Rectal cancer Neg Hx    Stomach cancer Neg Hx    Social History   Socioeconomic History   Marital status: Divorced    Spouse name: Not on file   Number of children: Not on file   Years of education: Not on file   Highest education level: Not on file  Occupational History   Occupation: disable    Employer: DISABLED  Tobacco Use   Smoking status: Never   Smokeless tobacco: Never  Vaping Use   Vaping Use: Never used  Substance and Sexual Activity   Alcohol use: No   Drug use: No   Sexual activity: Not on file    Comment: second hand smoke  Other Topics Concern   Not on file  Social  History Narrative   Has one child lives at home with her.    Social Determinants of Health   Financial Resource Strain: Not on file  Food Insecurity: Not on file  Transportation Needs: Not on file  Physical Activity: Not on file  Stress: Not on file  Social Connections: Not on file   Past Surgical History:  Procedure Laterality Date   ABDOMINAL HYSTERECTOMY  1987   W/ [place] DECOMP/DISCECTOMY FUSION N/A 08/09/2012   Procedure: ANTERIOR CERVICAL DECOMPRESSION/DISCECTOMY FUSION 1 LEVEL;  Surgeon: Floyce Stakes, MD;  [facility];  Service: Neurosurgery;  Laterality: N/A;  Cervical four-five  Anterior cervical decompression/diskectomy/fusion   ANTERIOR FUSION CERVICAL SPINE  2007   C5 -6   APPENDECTOMY     APPLICATION OF INTRAOPERATIVE CT SCAN N/A 05/17/2017   Procedure: APPLICATION OF INTRAOPERATIVE CT SCAN;  Surgeon: Erline Levine, MD;  Location: [place];  Service: Neurosurgery;  Laterality: N/A;   BACK SURGERY     x 5   BREAST EXCISIONAL BIOPSY Bilateral    No scar seen    BREAST SURGERY     x 2 biopsies   CARPAL TUNNEL RELEASE  RIGHT - 2001  & DEC 2011 W/  BACK SURG.   CERVICAL DISC SURGERY  2005   C5 - 6   CHOLECYSTECTOMY  1994   DORSAL COMPARTMENT RELEASE Right 04/07/2013   Procedure: RIGHT WRIST STS RELEASE;  Surgeon: Schuyler Amor, MD;  Location: [place];  Service: Orthopedics;  Laterality: Right;   ENDOVENOUS ABLATION SAPHENOUS VEIN W/ LASER Right 05/29/2019   endovenous laser ablation right greater saphenous vein by Gae Gallop MD    EXPLORATION OF INCISION FOR CSF LEAK  DEC 2011  X2   POST [place] Right 04/07/2013   Procedure: RIGHT INDEX AND RIGHT LONG DISTAL INTERPHALANGEAL JOINT FUSIONS;  Surgeon: Schuyler Amor, MD;  Location: [place];  Service: Orthopedics;  Laterality: Right;   FINGER ARTHROPLASTY Right 04/07/2013   Procedure: RIGHT THUMB [place] ARTHROPLASTY;   Surgeon: Schuyler Amor, MD;  Location: [place];  Service: Orthopedics;  Laterality: Right;   GANGLION CYST EXCISION Right 04/07/2013   Procedure: RIGHT WRIST MASS EXCISION;  Surgeon: Schuyler Amor, MD;  Location: [place];  Service: Orthopedics;  Laterality: Right;   KNEE ARTHROSCOPY  LEFT X3 (LAST ONE 2005)   KNEE ARTHROSCOPY  05/17/2011   Procedure: ARTHROSCOPY KNEE;  Surgeon: Bradley Ferris III;  Location: [place];  Service: Orthopedics;  Laterality: Right;  WITH MEDIAL MENISECTOMY AND removal of suprapatella fat lump   LEFT WRIST TENOSYNECTOMY W/ LEFT THUMB JOINT REPAIR  02-24-10   LUMBAR FUSION  11-30-10   L4 - 5   LUMBAR FUSION  2000   L2 - 4   LUMBAR LAMINECTOMY  DEC 2011   L4 - 5   POSTERIOR CERVICAL FUSION/FORAMINOTOMY N/A 05/17/2017   Procedure: Cervical five  to Thorastic one  Posterior cervical fusion with lateral mass screws/revision of prior instrumentation;  Surgeon: Erline Levine, MD;  Location: [place];  Service: Neurosurgery;  Laterality: N/A;  C5 to T1 Posterior cervical fusion with lateral mass screws/revision of prior instrumentation   TENDON REPAIR  JAN 2010   LEFT INDEX AND LONG FINGERS   THUMB SURGERY   04/07/2019   RIGHT THUMB   TUBAL LIGATION     Past Surgical History:  Procedure Laterality Date   ABDOMINAL HYSTERECTOMY  1987   W/ BSO   ANTERIOR CERVICAL DECOMP/DISCECTOMY FUSION N/A 08/09/2012   Procedure: ANTERIOR CERVICAL DECOMPRESSION/DISCECTOMY FUSION 1 LEVEL;  Surgeon: Floyce Stakes, MD;  [facility];  Service: Neurosurgery;  Laterality: N/A;  Cervical four-five  Anterior cervical decompression/diskectomy/fusion   ANTERIOR FUSION CERVICAL SPINE  2007   C5 -6   APPENDECTOMY     APPLICATION OF INTRAOPERATIVE CT SCAN N/A 05/17/2017   Procedure: APPLICATION OF INTRAOPERATIVE CT SCAN;  Surgeon: Erline Levine, MD;  Location: [place];  Service: Neurosurgery;  Laterality: N/A;   BACK  SURGERY     x 5   BREAST EXCISIONAL BIOPSY Bilateral    No scar seen    BREAST SURGERY     x 2 biopsies   CARPAL TUNNEL RELEASE  RIGHT - 2001  & DEC 2011 W/ BACK SURG.   CERVICAL DISC SURGERY  2005   C5 - 6   CHOLECYSTECTOMY  1994   DORSAL COMPARTMENT RELEASE Right 04/07/2013   Procedure: RIGHT WRIST STS RELEASE;  Surgeon: Schuyler Amor, MD;  Location: [place];  Service: Orthopedics;  Laterality: Right;   ENDOVENOUS ABLATION SAPHENOUS VEIN W/ LASER Right 05/29/2019   endovenous laser ablation right greater  saphenous vein by Gae Gallop MD    EXPLORATION OF INCISION FOR CSF LEAK  [place] 2011  X2   POST [place] Right 04/07/2013   Procedure: RIGHT INDEX AND RIGHT LONG DISTAL INTERPHALANGEAL JOINT FUSIONS;  Surgeon: Schuyler Amor, MD;  Location: [place];  Service: Orthopedics;  Laterality: Right;   FINGER ARTHROPLASTY Right 04/07/2013   Procedure: RIGHT THUMB [place] ARTHROPLASTY;  Surgeon: Schuyler Amor, MD;  Location: [place];  Service: Orthopedics;  Laterality: Right;   GANGLION CYST EXCISION Right 04/07/2013   Procedure: RIGHT WRIST MASS EXCISION;  Surgeon: Schuyler Amor, MD;  Location: [place];  Service: Orthopedics;  Laterality: Right;   KNEE ARTHROSCOPY  LEFT X3 (LAST ONE 2005)   KNEE ARTHROSCOPY  05/17/2011   Procedure: ARTHROSCOPY KNEE;  Surgeon: Bradley Ferris III;  Location: [place];  Service: Orthopedics;  Laterality: Right;  WITH MEDIAL MENISECTOMY AND removal of suprapatella fat lump   LEFT WRIST TENOSYNECTOMY W/ LEFT THUMB JOINT REPAIR  02-24-10   LUMBAR FUSION  11-30-10   L4 - 5   LUMBAR FUSION  2000   L2 - 4   LUMBAR LAMINECTOMY  DEC 2011   L4 - 5   POSTERIOR CERVICAL FUSION/FORAMINOTOMY N/A 05/17/2017   Procedure: Cervical five  to Thorastic one  Posterior cervical fusion with lateral mass screws/revision of prior instrumentation;  Surgeon:  Erline Levine, MD;  Location: [place];  Service: Neurosurgery;  Laterality: N/A;  C5 to T1 Posterior cervical fusion with lateral mass screws/revision of prior instrumentation   TENDON REPAIR  JAN 2010   LEFT INDEX AND LONG FINGERS   THUMB SURGERY   04/07/2019   RIGHT THUMB   TUBAL LIGATION     Past Medical History:  Diagnosis Date   Acute sinusitis, unspecified    Allergy    Anemia    "quite a few times"   Anxiety state, unspecified    Arachnoiditis BILATERAL LEGS   DUE TO MULTIPLE BACK SURG.'S   Arthritis    Asthma    last flare up was 03/2017 lasted over a month   Blood transfusion    Cancer ([place])    skin cancers (in scalp)   Cardiomyopathy HX --06/2010   EF was 25% during acute illness (PHELONEPHRITIS) Repeat echo 12-06-10 60% showed normal EF.    Chronic back pain greater than 3 months duration    S/P BACK SURG'S   CSF leak    Diabetes mellitus without complication ([place])    dx 2013 type 2   Dyslipidemia    Dysphagia    some post op cerv fusion 2/14   Dysrhythmia    Essential hypertension, benign    Family history of adverse reaction to anesthesia    mother gets n/v   GERD (gastroesophageal reflux disease) AND HIATIAL HERNIA   CONTROLLED W/ NEXIUM   Headache(784.0)    History of chronic bronchitis    Hx of bladder infections    Hyperlipidemia    TAKE CHLOSTEROL MEDICATION,04/23/19   Hypertension    Neuromuscular disorder (HCC)    numbness and tingling   Osteoporosis    Other malaise and fatigue    PONV (postoperative nausea and vomiting)    Shortness of breath    Spinal headache    Spinal stenosis, cervical region    Varicosities    venous   Weakness of both legs DUE TO ARACHNOIDITIS   OCCASIONAL USES CANE  BP 126/78   Pulse 81   Ht '5\' 5"'$  (1.651 m)   Wt 165 lb 9.6 oz (75.1 kg)   SpO2 96%   BMI 27.56 kg/m   Opioid Risk Score:   Fall Risk Score:  `1  Depression screen PHQ 2/9     11/16/2021    2:15 PM 09/13/2021    1:39 PM 08/16/2021    1:04 PM  04/19/2021    1:34 PM 02/18/2021    1:27 PM 01/20/2021    1:50 PM 12/22/2020    1:12 PM  Depression screen PHQ 2/9  Decreased Interest 0 0 0 1 0 0 1  Down, Depressed, Hopeless 0 0 0 1 0 0 1  PHQ - 2 Score 0 0 0 2 0 0 2    Review of Systems  Constitutional: Negative.   HENT: Negative.    Eyes: Negative.   Respiratory: Negative.    Cardiovascular: Negative.   Gastrointestinal: Negative.   Endocrine: Negative.   Genitourinary: Negative.   Musculoskeletal:  Positive for back pain and neck pain.  Skin: Negative.   Allergic/Immunologic: Negative.   Neurological: Negative.   Hematological: Negative.   Psychiatric/Behavioral: Negative.    All other systems reviewed and are negative.     Objective:   Physical Exam Vitals and nursing note reviewed.  Constitutional:      Appearance: Normal appearance.  Cardiovascular:     Rate and Rhythm: Normal rate and regular rhythm.     Pulses: Normal pulses.     Heart sounds: Normal heart sounds.  Pulmonary:     Effort: Pulmonary effort is normal.     Breath sounds: Normal breath sounds.  Musculoskeletal:     Cervical back: Normal range of motion and neck supple.     Right lower leg: Edema present.     Left lower leg: Edema present.     Comments: Normal Muscle Bulk and Muscle Testing Reveals:  Upper Extremities: Full ROM and Muscle Strength 5/5  Lumbar Paraspinal Tenderness: L-3-L-5 Lower Extremities: Full ROM and Muscle Strength 5/5 Arises from Table with ease Narrow Based  Gait     Skin:    General: Skin is warm and dry.  Neurological:     Mental Status: She is alert and oriented to person, place, and time.  Psychiatric:        Mood and Affect: Mood normal.        Behavior: Behavior normal.         Assessment & Plan:  1. On 05/17/2017 :C5C6C7 T1 Posterior Cervical Fusion with lateral mass fixation with [facility], revision of prior instrumentation. By Dr. Vertell Limber.  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 Continue current medication regimen with  Gabapentin 800 mg BID. [place]: Oxycodone '15mg'$  one tablet 5 times a day  as needed for pain # 150 tablets 11/16/2021. We will continue the opioid monitoring program, this consists of regular clinic visits, examinations, urine drug screen, pill counts as well as use of [place] Controlled Substance Reporting system. 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 Continue to Monitor. 11/16/2021 3. Anxiety/depression: PCP Following. Continue to monitor. 11/16/2021. 4. Muscle Spasms: Continue current medication regime with Flexeril. Continue to Monitor. 11/16/2021 5. Cervicalgia/ Cervical Radiculitis: Dr Vertell Limber Following. ,Continue current medication regime with Gabapentin:  S/P  C5C6C7 T1 Posterior Cervical Fusion with lateral mass  fixation with AIR[facility] Imaging, revision of prior instrumentation by Dr. Vertell Limber on 05/17/2017. 11/16/2021 7. Bilateral Thoracic Back Pain: Continue HEP as Tolerated and Continue current medication regimen. Continue to Monitor. 11/16/2021  8. Left lower extremity DVT/ Phlebitis:  S/P endovenous laser ablation of left great saphenous vein on 05/09/2018 by Dr. Scot Dock:  Vascular  Following. 11/16/2021. 9. Insomnia: Continue Pamelor. Continue to Monitor. 11/16/2021. 10. Left Knee Pain: No complaints today. Ortho Following. 11/16/2021     F/U in 1 month

## 2021-11-21 LAB — TOXASSURE SELECT,+ANTIDEPR,UR

## 2021-11-22 ENCOUNTER — Telehealth: Payer: Self-pay | Admitting: *Deleted

## 2021-11-22 NOTE — Telephone Encounter (Signed)
Urine drug screen for this encounter is consistent for prescribed medication 

## 2021-11-28 ENCOUNTER — Other Ambulatory Visit: Payer: Self-pay | Admitting: Internal Medicine

## 2021-11-28 DIAGNOSIS — Z1231 Encounter for screening mammogram for malignant neoplasm of breast: Secondary | ICD-10-CM

## 2021-12-02 ENCOUNTER — Telehealth: Payer: Self-pay | Admitting: Internal Medicine

## 2021-12-02 NOTE — Telephone Encounter (Signed)
Called and spoke with pt letting her know that she could discuss with MR to see when another CT could be done and she verbalized understanding. Nothing further needed.

## 2021-12-14 DIAGNOSIS — I10 Essential (primary) hypertension: Secondary | ICD-10-CM | POA: Diagnosis not present

## 2021-12-14 DIAGNOSIS — E782 Mixed hyperlipidemia: Secondary | ICD-10-CM | POA: Diagnosis not present

## 2021-12-14 DIAGNOSIS — E1169 Type 2 diabetes mellitus with other specified complication: Secondary | ICD-10-CM | POA: Diagnosis not present

## 2021-12-14 DIAGNOSIS — L570 Actinic keratosis: Secondary | ICD-10-CM | POA: Diagnosis not present

## 2021-12-15 ENCOUNTER — Encounter (INDEPENDENT_AMBULATORY_CARE_PROVIDER_SITE_OTHER): Payer: Self-pay | Admitting: *Deleted

## 2021-12-21 ENCOUNTER — Encounter: Payer: Self-pay | Admitting: Internal Medicine

## 2021-12-21 ENCOUNTER — Ambulatory Visit (INDEPENDENT_AMBULATORY_CARE_PROVIDER_SITE_OTHER): Payer: Medicare Other | Admitting: Internal Medicine

## 2021-12-21 VITALS — BP 126/82 | HR 76 | Temp 98.2°F | Ht 65.0 in | Wt 161.2 lb

## 2021-12-21 DIAGNOSIS — Z809 Family history of malignant neoplasm, unspecified: Secondary | ICD-10-CM

## 2021-12-21 DIAGNOSIS — J4540 Moderate persistent asthma, uncomplicated: Secondary | ICD-10-CM | POA: Diagnosis not present

## 2021-12-21 DIAGNOSIS — R918 Other nonspecific abnormal finding of lung field: Secondary | ICD-10-CM

## 2021-12-21 DIAGNOSIS — M545 Low back pain, unspecified: Secondary | ICD-10-CM | POA: Diagnosis not present

## 2021-12-21 NOTE — Patient Instructions (Addendum)
Moderate persistent asthma without complication  - stable thought recently more symptomatic due to heat and [place] smoke. BUt now back to baselineline  Plan  -  Plan - Continue Symbicort any other medications as before - albuterol as needed - flu shot and RSV vaccine in fall  Multiple lung nodules - last CT Dec 2021 Family history of cancer  - brother with recent diagnosis of lung cancer with mets to liver (suspected) - dad died of liver cancer  Plan  - repeat  CT chest without contrast - refer genetics counselor  Follow-up - 6  months or sooner if needed

## 2021-12-21 NOTE — Progress Notes (Signed)
OV 04/18/2016  Chief Complaint  Patient presents with   Follow-up    Pt states voice is raspy. Pt c/o of some congestion more in morning, no tightness, and SOB w/ and w/o excertions. Pt states cough comes and goes.     FU asthma - MCT positive - on symbicort FU 29m lung nodules - passive smoking Chronic voice hoarseness nos  Seeing afer 1 year. In interim has seen others. [place] at beach s/p syncope and near cardiac arrest due to dehydratiin per her hx. Since then not the same. Last week having right infrascapular pain that gets worse when she lifts her hand. Also last week or so increased hoarseness of voice is increased cough and wheezing production.She is demanding to have arepeat CT scan of chest for her 382mnodules.She is extremely worried about cancer.   OV 02/27/2017  Chief Complaint  Patient presents with   Follow-up    6 month f/u, moderate asthma, been doing ok but started wheezing at night when she lays down, allergies causing congestion    Follow-up asthma based on methacholine challenge test positivity and chronic hoarseness of voice  Last seen October 2017. She says for the last 7-8 months she's been having nocturnal wheezing that is waking up at night. But in the daytime she does not have any wheezing. She is also also having dysphagia that is chronic. She was referred to ENT particularly in the context of having to have C-spine surgery. Apparently her  Vocal cords were inflamed and he was then asked to take double dose of Protonix which seemed to help the cough but not fully. This is frustrating her. She struggling with understanding the pathophysiology of this. She's taking Symbicort and singulair regularly. She has lost 15 pounds of weight in the last few weeks intentionally according to her and so she does not think acid reflux is a problem.    asthma control questionnaire shows that at night she is waking up a few times because of perceived asthma symptoms.  When she wakes up she is asymptomatic and she is only very slightly limited in her activities by asthma. She is only experiencing very little shortness of breath because of asthma. She is wheezing only a little of the time at night but is using a lot of albuterol for rescue. 5 point average score is 1  feno is 25 ppb and normal 02/27/2017  She is asking for =- new issue of pre op clearance prior to C spine surgery next month   OV 08/30/2017   Chief Complaint  Patient presents with   Follow-up    Pt states she was in the hospital February 13 in [place] due to pna.  Pt does have current complaints of cough, fatigue, hoarseness, and chest tightness. States she is unsure if she is fully over the pna.      Follow-up asthma based on methacholine challenge test positivity and chronic hoarseness of voice  71 year old female follow-up asthma based on methacholine challenge test and chronic hoarseness of voice.  Last seen fall 2018.  After that she tells me that in February 2019 she got hospitalized after suddenly taking ill.  She shows a blood gas results from the outside showing hypoxemia without hypercapnia.  She was not intubated or treated with BiPAP.  She was treated in the medical floor with oxygen and antibiotics.  She says she had hospital-acquired delirium and was diagnosed with right upper lobe pneumonia.  She was  then discharged after a few days.  Since then she is improving slowly but having significant fatigue review of the lab results from [facility] also shows anemia but otherwise chemistries were normal.  I do not have a chest x-ray for my visualization.  Now she tells me that despite being on inhalers for the last 2 days she is having worsening cough that is dry with white mucus no wheezing no orthopnea no proximal nocturnal dyspnea no fever.  She is worried about the cough.     Walking desaturation test on 08/30/2017 185 feet x 3 laps on ROOM AIR:  did not desaturate. Waklked at slow  moderate pace. Rest pulse ox was 99%, final pulse ox was 94%. HR response 96/min at rest to 101/min at peak exertion. Patient [name] [name]  Did not Desaturate < 88% . [name] [name] yes did  Desaturated </= 3% points. [name] [name] yes did get tachyardic. Got fatigued but only mildly     OV 03/28/2018  Subjective:  Patient ID: [name] [name], female , DOB: [birth year] , age 71 y.o. , MRN: [id number] , ADDRESS: [place] [address]   03/28/2018 -   Chief Complaint  Patient presents with   Follow-up    still not feeling well- retaining fluid in leg - refused weight    Follow-up asthma based on methacholine challenge test positivity and chronic hoarseness of voice  HPI [name] [name] 71 y.o. -presents for routine follow-up.  She tells me that through the summer 2019 she has had 2 course of antibiotics and through a full course of prednisone for respiratory exacerbation.  She says she has chronic shortness of breath with exertion relieved by rest without associated chest pain.  She feels that the course is been progressive.  Moderate in intensity occasionally severe.  In addition she has nocturnal wheezing that is waking her up.  This despite using Xopenex and Entocort which seemed to help her.  In addition for the last 8 months or so she is reporting left lower extremity edema that is worse than the right side.  She has tried compression stockings without relief.  This is new report to me.  She says primary care is not referring her to cardiology.  She thinks that overall she is volume overloaded because of the symptom.  She had a stress test several years ago that was normal.  In addition is also reporting chronic hoarseness of voice that is worse.  Last visit with ENT was with Dr. Redmond Baseman approximately 1 year ago.  She still has not followed up with him.  Exam nitric oxide today is 13 ppb and normal asthma control questionnaire with significant amount of symptomatology.  ROS - per  HPI     OV 07/18/2018  Subjective:  Patient ID: [name] [name], female , DOB: 1950/12/08 , age 71 y.o. , MRN: [id number] , ADDRESS: [address] [place] [address]   07/18/2018 -   Chief Complaint  Patient presents with   Follow-up    Pt states she is still coughing with thick phlegm and is also hoarse due to her husband walking past her smoking a cigarette. Pt also states she is fatigued, dizzy, and has lost her taste and due to that is not eating that much. Pt states she has to use her xopenex inhaler about 4 times daily.   Follow-up asthma based on methacholine challenge test positivity and tracheobronchomalacia on CT OCt 2019 Fpollowup chronic hoarseness  of voice Foollowup chronic benign nodule oct 2019  HPI [name] [name] 71 y.o. -returns for follow-up.  Last seen by myself in October 2019.  After that she has had 3 visits with our nurse practitioners.  Each time for worsening respiratory symptoms and hoarseness of voice.  Each time getting prednisone.  Today she returns and has significant amount of respiratory symptoms.  However she is categorical that these are not changed in the last few weeks of few days in several days.  She says they are all stable.  But the symptom level burden is extremely high.  She is waking up several times at night.  When she wakes up she has mild symptoms.  She is moderately limited in her activities.  She has a little shortness of breath.  Wheezing moderate amount of the time and using albuterol for rescue multiple times daily.  This is despite being on chronic stable inhalers.  Of note, her CT scan in October 2019 showed tracheobronchomalacia.  So we sent her back to Dr. Redmond Baseman in ENT.  She says she has seen him.  I do not have the report with me.  Apparently the vocal cords were red.  Only supportive care advised.  In addition now she is having a new complaint of chronic dysphagia for the last 1 year.  And that she get stuck with mashed potato.  Her  last GI evaluation was 10 years ago in [place].  At that time she did not have these current problems.  She is willing to see GI at this point.  She is also open to having a tertiary care referral.     Results for [name] [name], [name] [name] (MRN [id number])  Ref. Range 12/24/2014 16:53 08/30/2017  07/18/2018   Nitric Oxide Unknown 27 Could not perform 14 ppb   IMPRESSION: 1. No findings to suggest interstitial lung disease. 2. Moderate air trapping indicative of small airways disease. 3. 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 4. Innumerable tiny 1-2 mm pulmonary nodules scattered throughout the lungs bilaterally, stable dating back to 2017, considered definitively benign. 5. Hepatic steatosis.     Electronically Signed   By: Vinnie Langton M.D.   On: 04/12/2018 11:09   ROS - per HPI   OV 04/01/2019  Subjective:  Patient ID: [name] [name], female , DOB: 04/04/1951 , age 60 y.o. , MRN: [id number] , ADDRESS: [place] [address]   04/01/2019 -   Chief Complaint  Patient presents with   Follow-up    Pt states overall she feels her breathing is unchanged since last OV in 12/2018. However, pt c/o desats to high 80's when lyning down for bed, pt doesnt lay flat. Pt also c/o daily nausea and a couple episodes of vomiting in the last 2 weeks. Pt states she has lost 6 lbs over the last couple weeks. Pt denies cough, CP/tightness and f/c/s.    Follow-up asthma based on methacholine challenge test positivity and tracheobronchomalacia on CT OCt 2019 Fpollowup chronic hoarseness of voice with associated red vocal cords per history of examination by Dr. Redmond Baseman ENT 2019 Foollowup chronic benign nodule oct 2019  HPI [name] [name] 71 y.o. -returns for follow-up.  Last seen January 2020 and then lost to follow-up because of the pandemic.  In the interim in June 2020 she got admitted for hypercapnia at [facility].  She was discharged a day later.  Never got intubated.  Apparently  she responded to Narcan.  Heavy pain medication opioid use  was documented but she says this was not a overdose admission.  She refuses to have blood gas test again.  Currently overall she is feeling well.  However she is lost 6 pounds of weight unintentionally.  And she is having intermittent nausea and vomiting of moderate intensity.  Review of medication shows polypharmacy associated with multiple opioid medications but she does not think this is the reason.  She is worried about malignancy.  She is asking for a CT scan of the chest for GI symptoms.  Most recent CT scan of the chest was in October 2019 and other than chronic stable benign nodules no ILD or other findings.  Of note she is also having dysphagia that is chronic.  I referred her to GI in early 2020 but because of the pandemic this did not happen.  She is willing to get referred to GI again.  She also thinks her pulse ox drops at night.  07/29/2019 Follow up: Asthma  Patient presents for a 1 month follow-up.  Patient was seen last visit for an asthmatic bronchitic exacerbation.  She was treated with Augmentin and a prednisone taper.  Her Symbicort was increased from 80-160.  Patient says she is starting to feel better.  She has decreased cough and wheezing.  Unfortunately she did not pick up her Symbicort because pharmacist did not have it.  Patient denies any chest pain orthopnea PND or increased leg swelling.   ROS - per HPI   OV 11/11/2019  Subjective:  Patient ID: [name] [name], female , DOB: [birth year] , age 44 y.o. , MRN: [id number] , ADDRESS: [address] [place] [address]   Follow-up asthma based on methacholine challenge test positivity and tracheobronchomalacia on CT OCt 2019 Fpollowup chronic hoarseness of voice with associated red vocal cords per history of examination by Dr. Redmond Baseman ENT 2019 Foollowup chronic benign nodule oct 2019 -3 mm Follow-up chronic cough  11/11/2019 -   Chief Complaint  Patient presents with    Follow-up     HPI [name] [name] 71 y.o. -returns for follow-up.  Last seen by myself in October 2020 last seen by nurse practitioner February 2021.  Nurse practitioner picked up on the fact she was on Fosamax.  She asked the patient to stop it but patient has not stopped it because of fear of osteoporosis.  She says she tried alternative Boniva but does not know why this was changed to Fosamax.  She is willing to try another alternative because of acid reflux.  Last visit I referred her to GI but she no showed with Dr. Lucio Edward but she says this was because of personal issues with Dr. Fuller Plan will not follow her anymore.  She says at this point she just wants to try stopping the Fosamax and seeing what her symptoms do.  She says she continues to have significant acid reflux and cough and hoarseness.  In the last 1 month she thinks asthma is affected a little of the time and she is been short of breath once or twice a week.  She is also used her rescue nebulizer inhaler 2-3 times per week but she believes her asthma to be well controlled.  She is some amount of social stress because her mom is ill and she is having to take her mom to a different doctor visits.  Also she wants switch from her Xopenex to albuterol inhaler because of insurance reasons.  She wants to have a repeat CT scan of  the chest if her GI symptoms have not resolved especially her cough particularly after stopping the Fosamax.    After completing the office visit.  She call me back again.  She had several questions about Covid vaccine.  She is scared to get it because of the side effects.  She says all her doctors have recommended she get it.  She said previous vaccines without any problems.  She does not have any allergy to injectables or vaccines.  She wanted to know the mechanism of action and the ingredients in the vaccine.    OV 05/18/2020  Subjective:  Patient ID: [name] [name], female , DOB: December 14, 1950 , age 43 y.o. , MRN:  [id number] , ADDRESS: [place] [address] PCP Neale Burly, MD Patient Care Team: Neale Burly, MD as PCP - General (Unknown Physician Specialty)  This Provider for this visit: Treatment Team:  Attending Provider: Brand Males, MD    05/18/2020 -   Chief Complaint  Patient presents with   Follow-up    Asthma, having back pain and more SOB and cough at night    Follow-up asthma based on methacholine challenge test positivity and tracheobronchomalacia on CT OCt 2019 Fpollowup chronic hoarseness of voice with associated red vocal cords per history of examination by Dr. Redmond Baseman ENT 2019 Foollowup chronic benign nodule oct 2019 -3 mm Follow-up chronic cough  Last CT scan of the chest October 2019  HPI [name] [name] 71 y.o. -last visit May 2021.  After that overall she has been doing well.  She was able to get the Covid vaccine.  She says for the last few weeks she has been having some congestion with on and off coughing and discolored mucus production.  She feels she needs antibiotic and prednisone.  In addition she says that she has lower rib pain coming back.  Therefore she is asking for repeat CT chest.  Last one was over 2 years ago.  She stopped Fosamax and is unclear if this is actually helped her respiratory symptoms.  Based on symptomatology her ACT score is eight showing significant symptoms.  We do not have a baseline on that.  Previously nitric oxide normal.  Today on repeat ius 29 and in grey zone.  She is interested in getting prednisone antibiotic.  In addition she is asking for CT scan because of previous history of nodules and has been over 2 years since she had a CT chest.  She is certainly nervous about it.     Asthma Control Test ACT Total Score  05/18/2020 8      Lab Results  Component Value Date   NITRICOXIDE 14 07/18/2018     ROS - per HPI OV 12/07/2020  Subjective:  Patient ID: [name] [name], female , DOB: 1950-12-06 , age 89  y.o. , MRN: [id number] , ADDRESS: [address] [place] [address] PCP Neale Burly, MD Patient Care Team: Neale Burly, MD as PCP - General (Unknown Physician Specialty)  This Provider for this visit: Treatment Team:  Attending Provider: Brand Males, MD  Follow-up asthma based on methacholine challenge test positivity and tracheobronchomalacia on CT OCt 2019 Fpollowup chronic hoarseness of voice with associated red vocal cords per history of examination by Dr. Redmond Baseman ENT 2019 Foollowup chronic benign nodule oct 2019 -3 mm Follow-up chronic cough   12/07/2020 -   Chief Complaint  Patient presents with   Follow-up    Pt states she has been doing  okay since last visit and denies any real complaints.     HPI [name] [name] 71 y.o. -returns for follow-up.  She is on Symbicort.  She is doing well.  ACT symptom score is improved from 8-19.  Is the best she has felt in a long time.  She says primary care gave her a 3% hypertonic saline nebulizer but its not helping.  In addition nebulizer machine is broken she wants a new one from the DME company.  She has 3 cats and she believes they are not allergic to her.  There are no other new issues.  She is finding the humidity a little bit difficult for the summer but otherwise okay    Asthma Control Test ACT Total Score  12/07/2020 19  05/18/2020 8     Lab Results  Component Value Date   NITRICOXIDE 14 07/18/2018     CT Chest data 06/02/2020  TECHNIQUE: Multidetector CT imaging of the chest was performed following the standard protocol without IV contrast.   COMPARISON:  04/11/2018   FINDINGS: Cardiovascular:  No acute findings.   Mediastinum/Nodes: No masses or pathologically enlarged lymph nodes identified on this unenhanced exam.   Lungs/Pleura: Mild scarring again seen in the inferior lingula and anterior right lower lobe. Scattered tiny 1-2 mm nodules are seen in both lungs, predominantly in the lower lobes.  These are unchanged compared with previous studies, consistent with benign etiology. No suspicious nodules or masses identified. No evidence of infiltrate or pleural effusion.   Upper Abdomen:  Unremarkable.   Musculoskeletal:  No suspicious bone lesions.   IMPRESSION: Stable tiny 1-2 mm bilateral pulmonary nodules, consistent with benign etiology. No active disease.     Electronically Signed   By: Marlaine Hind M.D.   On: 06/03/2020 09:12  No results found.    PFT  No flowsheet data found.    OV 12/21/2021  Subjective:  Patient ID: [name] [name], female , DOB: [birth year] , age 74 y.o. , MRN: [id number] , ADDRESS: [address] [place] [address] PCP Neale Burly, MD Patient Care Team: Neale Burly, MD as PCP - General (Unknown Physician Specialty)  This Provider for this visit: Treatment Team:  Attending Provider: Brand Males, MD  Follow-up asthma based on methacholine challenge test positivity and tracheobronchomalacia on CT OCt 2019 Fpollowup chronic hoarseness of voice with associated red vocal cords per history of examination by Dr. Redmond Baseman ENT 2019 Foollowup chronic benign nodule oct 2019 -3 mm  - lsad CT Dec 2021 Follow-up chronic cough   12/21/2021 -   Chief Complaint  Patient presents with   Follow-up    Pt states that the heat and smoke from [place] has messed with her breathing. Pt would like a PET scan to rule out Cancer.     HPI [name] [name] 71 y.o. -returns for follow-up.  This is a routine follow-up.  In terms of asthma she stable on Symbicort.  However recently with the [place] she did get more symptomatic and also with the heat and humidity of the [place] but she is staying in an she feels she is at baseline.  She does not feel she is in an exacerbation.  She is noted to have very small lung nodules and the last CT scan was in 2021 and she was reassured.  However in the interim her brother is lost weight he has COPD he had a  liver biopsy and the suspicion that he has stage IV  lung cancer with liver mets.  She is very upset about this.  Her dad also died from liver cancer at age 51.  She wants a PET scan.  Did explain to her insurance concerns with PET scan.  She is agreed to do a CT scan of the chest for the lung nodules.  In addition we discussed about visiting genetics counselor and the limitations and potential benefits of that.  She is willing to get that done.  But her respiratory complaints are stable and there are no other new issues.    CT Chest data  No results found.    PFT      No data to display             has a past medical history of Acute sinusitis, unspecified, Allergy, Anemia, Anxiety state, unspecified, Arachnoiditis (BILATERAL LEGS), Arthritis, Asthma, Blood transfusion, Cancer ([place]), Cardiomyopathy (HX --06/2010), Chronic back pain greater than 3 months duration, CSF leak, Diabetes mellitus without complication ([place]), Dyslipidemia, Dysphagia, Dysrhythmia, Essential hypertension, benign, Family history of adverse reaction to anesthesia, GERD (gastroesophageal reflux disease) (AND HIATIAL HERNIA), Headache(784.0), History of chronic bronchitis, bladder infections, Hyperlipidemia, Hypertension, Neuromuscular disorder ([place]), Osteoporosis, Other malaise and fatigue, PONV (postoperative nausea and vomiting), Shortness of breath, Spinal headache, Spinal stenosis, cervical region, Varicosities, and Weakness of both legs (DUE TO ARACHNOIDITIS).   reports that she has never smoked. She has never used smokeless tobacco.  Past Surgical History:  Procedure Laterality Date   ABDOMINAL HYSTERECTOMY  1987   W/ BSO   ANTERIOR CERVICAL DECOMP/DISCECTOMY FUSION N/A 08/09/2012   Procedure: ANTERIOR CERVICAL DECOMPRESSION/DISCECTOMY FUSION 1 LEVEL;  Surgeon: Floyce Stakes, MD;  Location: [facility];  Service: Neurosurgery;  Laterality: N/A;  Cervical four-five  Anterior cervical  decompression/diskectomy/fusion   ANTERIOR FUSION CERVICAL SPINE  2007   C5 -6   APPENDECTOMY     APPLICATION OF INTRAOPERATIVE CT SCAN N/A 05/17/2017   Procedure: APPLICATION OF INTRAOPERATIVE CT SCAN;  Surgeon: Erline Levine, MD;  Location: [place] [facility];  Service: Neurosurgery;  Laterality: N/A;   BACK SURGERY     x 5   BREAST EXCISIONAL BIOPSY Bilateral    No scar seen    BREAST SURGERY     x 2 biopsies   CARPAL TUNNEL RELEASE  RIGHT - 2001  & DEC 2011 W/ BACK SURG.   CERVICAL DISC SURGERY  2005   C5 - 6   CHOLECYSTECTOMY  1994   DORSAL COMPARTMENT RELEASE Right 04/07/2013   Procedure: RIGHT WRIST STS RELEASE;  Surgeon: Schuyler Amor, MD;  Location: [facility];  Service: Orthopedics;  Laterality: Right;   ENDOVENOUS ABLATION SAPHENOUS VEIN W/ LASER Right 05/29/2019   endovenous laser ablation right greater saphenous vein by Gae Gallop MD    EXPLORATION OF INCISION FOR CSF LEAK  DEC 2011  X2   POST [place] Right 04/07/2013   Procedure: RIGHT INDEX AND RIGHT LONG DISTAL INTERPHALANGEAL JOINT FUSIONS;  Surgeon: Schuyler Amor, MD;  Location: [facility];  Service: Orthopedics;  Laterality: Right;   FINGER ARTHROPLASTY Right 04/07/2013   Procedure: RIGHT THUMB [place] ARTHROPLASTY;  Surgeon: Schuyler Amor, MD;  Location: [facility];  Service: Orthopedics;  Laterality: Right;   GANGLION CYST EXCISION Right 04/07/2013   Procedure: RIGHT WRIST MASS EXCISION;  Surgeon: Schuyler Amor, MD;  Location: [facility];  Service: Orthopedics;  Laterality: Right;   KNEE ARTHROSCOPY  LEFT X3 (LAST  ONE 2005)   KNEE ARTHROSCOPY  05/17/2011   Procedure: ARTHROSCOPY KNEE;  Surgeon: Bradley Ferris III;  Location: [facility];  Service: Orthopedics;  Laterality: Right;  WITH MEDIAL MENISECTOMY AND removal of suprapatella fat lump   LEFT WRIST TENOSYNECTOMY W/ LEFT THUMB JOINT REPAIR  02-24-10    LUMBAR FUSION  11-30-10   L4 - 5   LUMBAR FUSION  2000   L2 - 4   LUMBAR LAMINECTOMY  DEC 2011   L4 - 5   POSTERIOR CERVICAL FUSION/FORAMINOTOMY N/A 05/17/2017   Procedure: Cervical five  to Thorastic one  Posterior cervical fusion with lateral mass screws/revision of prior instrumentation;  Surgeon: Erline Levine, MD;  Location: [facility];  Service: Neurosurgery;  Laterality: N/A;  C5 to T1 Posterior cervical fusion with lateral mass screws/revision of prior instrumentation   TENDON REPAIR  JAN 2010   LEFT INDEX AND LONG FINGERS   THUMB SURGERY   04/07/2019   RIGHT THUMB   TUBAL LIGATION      Allergies  Allergen Reactions   Latex Itching and Rash   Morphine And Related Hives and Itching   Acetaminophen Nausea And Vomiting   Morphine    Oxycodone Hcl    Aspirin Nausea And Vomiting    Can take coated asa   Biaxin [Clarithromycin] Nausea And Vomiting   Clarithromycin Diarrhea   Codeine Itching   Darvocet [Propoxyphene N-Acetaminophen] Itching   Hydrocodone-Acetaminophen Itching   Lortab [Hydrocodone-Acetaminophen] Itching   Percocet [Oxycodone-Acetaminophen] Itching   Tramadol Nausea Only    Immunization History  Administered Date(s) Administered   Fluad Quad(high Dose 65+) 04/01/2019   Influenza Split 04/19/2013, 03/18/2014   Influenza, High Dose Seasonal PF 04/25/2018   Influenza,inj,Quad PF,6+ Mos 05/04/2015, 02/27/2017   PFIZER(Purple Top)SARS-COV-2 Vaccination 03/06/2020, 03/25/2020   Pneumococcal Polysaccharide-23 04/19/2013   Zoster, Live 05/30/2013    Family History  Problem Relation Age of Onset   Hypertension Mother    Heart disease Mother        Mitral valve replacement.    Diabetes Mother    Cancer Father    Diabetes Brother    Colon cancer Neg Hx    Colon polyps Neg Hx    Esophageal cancer Neg Hx    Rectal cancer Neg Hx    Stomach cancer Neg Hx      Current Outpatient Medications:    albuterol (PROVENTIL) (2.5 MG/3ML) 0.083% nebulizer solution,  Inhale into the lungs., Disp: , Rfl:    albuterol (VENTOLIN HFA) 108 (90 Base) MCG/ACT inhaler, INHALE 2 PUFFS BY MOUTH EVERY 6 HOURS AS NEEDED FOR WHEEZING OR SHORTNESS OF BREATH, Disp: 8.5 g, Rfl: 4   aspirin EC 81 MG tablet, Take 81 mg by mouth at bedtime. , Disp: , Rfl:    budesonide-formoterol (SYMBICORT) 160-4.5 MCG/ACT inhaler, Inhale 2 puffs into the lungs 2 (two) times daily., Disp: 1 Inhaler, Rfl: 5   Calcium Carbonate-Vit D-Min 1200-1000 MG-UNIT CHEW, Chew 1 tablet by mouth daily., Disp: , Rfl:    diltiazem (CARDIZEM CD) 240 MG 24 hr capsule, Take 240 mg by mouth daily., Disp: , Rfl:    gabapentin (NEURONTIN) 800 MG tablet, Take 1 tablet (800 mg total) by mouth 3 (three) times daily., Disp: 90 tablet, Rfl: 3   losartan-hydrochlorothiazide (HYZAAR) 50-12.5 MG tablet, Take 1 tablet by mouth daily., Disp: , Rfl:    metFORMIN (GLUCOPHAGE) 500 MG tablet, Take 500 mg by mouth 2 (two) times daily with a meal. , Disp: , Rfl:  montelukast (SINGULAIR) 10 MG tablet, Take 10 mg by mouth at bedtime. Pt takes in the morning., Disp: , Rfl:    naloxone (NARCAN) nasal spray 4 mg/0.1 mL, Place 1 spray into the nose once as needed (Just in case of Opiod Overdose)., Disp: , Rfl:    nortriptyline (PAMELOR) 25 MG capsule, TAKE 1 CAPSULE(25 MG) BY MOUTH AT BEDTIME, Disp: 90 capsule, Rfl: 1   omeprazole (PRILOSEC) 40 MG capsule, Take 40 mg by mouth daily., Disp: , Rfl:    oxyCODONE (ROXICODONE) 15 MG immediate release tablet, Take 1 tablet (15 mg total) by mouth 5 (five) times daily as needed for pain. Do Not Fill Before 06/ 27/2023, Disp: 150 tablet, Rfl: 0   promethazine (PHENERGAN) 25 MG tablet, Take 25 mg by mouth as needed. , Disp: , Rfl:    simvastatin (ZOCOR) 10 MG tablet, Take by mouth., Disp: , Rfl:    topiramate (TOPAMAX) 25 MG tablet, Take 1 tablet (25 mg total) by mouth 2 (two) times daily., Disp: 180 tablet, Rfl: 1   TRUE METRIX BLOOD GLUCOSE TEST test strip, daily., Disp: , Rfl:    valACYclovir  (VALTREX) 500 MG tablet, Take 500 mg by mouth daily. , Disp: , Rfl:  No current facility-administered medications for this visit.  Facility-Administered Medications Ordered in Other Visits:    levalbuterol (XOPENEX) nebulizer solution 0.63 mg, 0.63 mg, Nebulization, Once, Parrett, Tammy S, NP      Objective:   Vitals:   12/21/21 1415  BP: 126/82  Pulse: 76  Temp: 98.2 F (36.8 C)  TempSrc: Oral  SpO2: 99%  Weight: 161 lb 3.2 oz (73.1 kg)  Height: '5\' 5"'$  (1.651 m)    Estimated body mass index is 26.83 kg/m as calculated from the following:   Height as of this encounter: '5\' 5"'$  (1.651 m).   Weight as of this encounter: 161 lb 3.2 oz (73.1 kg).  '@WEIGHTCHANGE'$ @  [employer]   12/21/21 1415  Weight: 161 lb 3.2 oz (73.1 kg)     Physical Exam    General: No distress. Looks well Neuro: Alert and Oriented x 3. GCS 15. Speech normal Psych: Pleasant Resp:  Barrel Chest - no.  Wheeze - no, Crackles - no, No overt respiratory distress CVS: Normal heart sounds. Murmurs - no Ext: Stigmata of Connective Tissue Disease - no HEENT: Normal upper airway. PEERL +. No post nasal drip        Assessment:       ICD-10-CM   1. Moderate persistent asthma without complication  Q94.76     2. Multiple lung nodules  R91.8     3. Family history of cancer  Z80.9          Plan:     Patient Instructions  Moderate persistent asthma without complication  - stable thought recently more symptomatic due to heat and [place] smoke. BUt now back to baselineline  Plan  -  Plan - Continue Symbicort any other medications as before - albuterol as needed - flu shot and RSV vaccine in fall  Multiple lung nodules - last CT Dec 2021 Family history of cancer  - brother with recent diagnosis of lung cancer with mets to liver (suspected) - dad died of liver cancer  Plan  - repeat  CT chest without contrast - refer genetics counselor  Follow-up - 6  months or sooner if  needed    SIGNATURE    Dr. Brand Males, M.D., F.C.C.P,  Pulmonary and Critical Care Medicine  Staff Physician, [place] Director - Interstitial Lung Disease  Program  [facility] [place], [place], [address]  Pager: [phone], If no answer or between  15:00h - 7:00h: call [phone] Telephone: [phone]  2:48 PM 12/21/2021

## 2021-12-21 NOTE — Addendum Note (Signed)
Addended by: Elby Beck R on: 12/21/2021 02:57 PM   Modules accepted: Orders

## 2021-12-26 ENCOUNTER — Encounter: Payer: Self-pay | Admitting: Vascular Surgery

## 2021-12-26 ENCOUNTER — Telehealth: Payer: Self-pay | Admitting: Genetic Counselor

## 2021-12-26 NOTE — Telephone Encounter (Signed)
Scheduled appt per 7/5 referral. Pt is aware of appt date and time. Pt is aware to arrive 15 mins prior to appt time and to bring and updated insurance card. Pt is aware of appt location.   

## 2021-12-29 ENCOUNTER — Encounter: Payer: Self-pay | Admitting: Registered Nurse

## 2021-12-29 ENCOUNTER — Encounter: Payer: Medicare Other | Attending: Physical Medicine & Rehabilitation | Admitting: Registered Nurse

## 2021-12-29 VITALS — BP 112/74 | HR 92 | Ht 65.0 in | Wt 168.0 lb

## 2021-12-29 DIAGNOSIS — M5416 Radiculopathy, lumbar region: Secondary | ICD-10-CM

## 2021-12-29 DIAGNOSIS — Z5181 Encounter for therapeutic drug level monitoring: Secondary | ICD-10-CM

## 2021-12-29 DIAGNOSIS — M961 Postlaminectomy syndrome, not elsewhere classified: Secondary | ICD-10-CM | POA: Diagnosis not present

## 2021-12-29 DIAGNOSIS — M545 Low back pain, unspecified: Secondary | ICD-10-CM | POA: Diagnosis not present

## 2021-12-29 DIAGNOSIS — M546 Pain in thoracic spine: Secondary | ICD-10-CM

## 2021-12-29 DIAGNOSIS — G039 Meningitis, unspecified: Secondary | ICD-10-CM

## 2021-12-29 DIAGNOSIS — Z79891 Long term (current) use of opiate analgesic: Secondary | ICD-10-CM

## 2021-12-29 DIAGNOSIS — R202 Paresthesia of skin: Secondary | ICD-10-CM | POA: Diagnosis not present

## 2021-12-29 DIAGNOSIS — G8929 Other chronic pain: Secondary | ICD-10-CM | POA: Diagnosis not present

## 2021-12-29 DIAGNOSIS — M5412 Radiculopathy, cervical region: Secondary | ICD-10-CM | POA: Diagnosis not present

## 2021-12-29 DIAGNOSIS — R200 Anesthesia of skin: Secondary | ICD-10-CM | POA: Diagnosis not present

## 2021-12-29 DIAGNOSIS — G894 Chronic pain syndrome: Secondary | ICD-10-CM

## 2021-12-29 DIAGNOSIS — M542 Cervicalgia: Secondary | ICD-10-CM | POA: Diagnosis not present

## 2021-12-29 DIAGNOSIS — M549 Dorsalgia, unspecified: Secondary | ICD-10-CM | POA: Diagnosis not present

## 2021-12-29 MED ORDER — OXYCODONE HCL 15 MG PO TABS: 15.0000 mg | ORAL_TABLET | Freq: Every day | ORAL | 0 refills | Status: DC | PRN

## 2021-12-29 NOTE — Progress Notes (Signed)
Subjective:    Patient ID: [name] [name], female    DOB: 1950-07-09, 71 y.o.   MRN: [id number]  HPI: [name] [name] is a 71 y.o. female who returns for follow up appointment for chronic pain and medication refill. She states her pain is located in her neck radiating into her bilateral shoulders and mid- lower back pain radiating ino her bilateral lower extremities L>R. She  rates her pain 8. Her current exercise regime is walking, pool therapy 3- 4 days a week and performing stretching exercises.  [name] Morphine equivalent is 112.50 MME.   Last UDS was Performed on 11/16/2021, it was consistent.      Pain Inventory Average Pain 7 Pain Right Now 8 My pain is constant, sharp, burning, stabbing, tingling, and aching  In the last 24 hours, has pain interfered with the following? General activity 8 Relation with others 8 Enjoyment of life 8 What TIME of day is your pain at its worst? morning , daytime, evening, night, and varies Sleep (in general) Fair  Pain is worse with: walking, bending, sitting, inactivity, standing, and some activites Pain improves with: rest, medication, and walking, heat Relief from Meds: 7  Family History  Problem Relation Age of Onset   Hypertension Mother    Heart disease Mother        Mitral valve replacement.    Diabetes Mother    Cancer Father    Diabetes Brother    Colon cancer Neg Hx    Colon polyps Neg Hx    Esophageal cancer Neg Hx    Rectal cancer Neg Hx    Stomach cancer Neg Hx    Social History   Socioeconomic History   Marital status: Divorced    Spouse name: Not on file   Number of children: Not on file   Years of education: Not on file   Highest education level: Not on file  Occupational History   Occupation: disable    Employer: DISABLED  Tobacco Use   Smoking status: Never   Smokeless tobacco: Never  Vaping Use   Vaping Use: Never used  Substance and Sexual Activity   Alcohol use: No   Drug use: No   Sexual  activity: Not on file    Comment: second hand smoke  Other Topics Concern   Not on file  Social History Narrative   Has one child lives at home with her.    Social Determinants of Health   Financial Resource Strain: Not on file  Food Insecurity: Not on file  Transportation Needs: Not on file  Physical Activity: Not on file  Stress: Not on file  Social Connections: Not on file   Past Surgical History:  Procedure Laterality Date   ABDOMINAL HYSTERECTOMY  1987   W/ [place] DECOMP/DISCECTOMY FUSION N/A 08/09/2012   Procedure: ANTERIOR CERVICAL DECOMPRESSION/DISCECTOMY FUSION 1 LEVEL;  Surgeon: Floyce Stakes, MD;  Location: [facility];  Service: Neurosurgery;  Laterality: N/A;  Cervical four-five  Anterior cervical decompression/diskectomy/fusion   ANTERIOR FUSION CERVICAL SPINE  2007   C5 -6   APPENDECTOMY     APPLICATION OF INTRAOPERATIVE CT SCAN N/A 05/17/2017   Procedure: APPLICATION OF INTRAOPERATIVE CT SCAN;  Surgeon: Erline Levine, MD;  Location: [facility];  Service: Neurosurgery;  Laterality: N/A;   BACK SURGERY     x 5   BREAST EXCISIONAL BIOPSY Bilateral    No scar seen    BREAST SURGERY  x 2 biopsies   CARPAL TUNNEL RELEASE  RIGHT - 2001  & DEC 2011 W/ BACK SURG.   CERVICAL DISC SURGERY  2005   C5 - 6   CHOLECYSTECTOMY  1994   DORSAL COMPARTMENT RELEASE Right 04/07/2013   Procedure: RIGHT WRIST STS RELEASE;  Surgeon: Schuyler Amor, MD;  Location: [facility];  Service: Orthopedics;  Laterality: Right;   ENDOVENOUS ABLATION SAPHENOUS VEIN W/ LASER Right 05/29/2019   endovenous laser ablation right greater saphenous vein by Gae Gallop MD    EXPLORATION OF INCISION FOR CSF LEAK  DEC 2011  X2   POST [place] Right 04/07/2013   Procedure: RIGHT INDEX AND RIGHT LONG DISTAL INTERPHALANGEAL JOINT FUSIONS;  Surgeon: Schuyler Amor, MD;  Location: [facility];  Service: Orthopedics;  Laterality:  Right;   FINGER ARTHROPLASTY Right 04/07/2013   Procedure: RIGHT THUMB [place] ARTHROPLASTY;  Surgeon: Schuyler Amor, MD;  Location: [facility];  Service: Orthopedics;  Laterality: Right;   GANGLION CYST EXCISION Right 04/07/2013   Procedure: RIGHT WRIST MASS EXCISION;  Surgeon: Schuyler Amor, MD;  Location: [facility];  Service: Orthopedics;  Laterality: Right;   KNEE ARTHROSCOPY  LEFT X3 (LAST ONE 2005)   KNEE ARTHROSCOPY  05/17/2011   Procedure: ARTHROSCOPY KNEE;  Surgeon: Bradley Ferris III;  Location: [facility];  Service: Orthopedics;  Laterality: Right;  WITH MEDIAL MENISECTOMY AND removal of suprapatella fat lump   LEFT WRIST TENOSYNECTOMY W/ LEFT THUMB JOINT REPAIR  02-24-10   LUMBAR FUSION  11-30-10   L4 - 5   LUMBAR FUSION  2000   L2 - 4   LUMBAR LAMINECTOMY  DEC 2011   L4 - 5   POSTERIOR CERVICAL FUSION/FORAMINOTOMY N/A 05/17/2017   Procedure: Cervical five  to Thorastic one  Posterior cervical fusion with lateral mass screws/revision of prior instrumentation;  Surgeon: Erline Levine, MD;  Location: [facility];  Service: Neurosurgery;  Laterality: N/A;  C5 to T1 Posterior cervical fusion with lateral mass screws/revision of prior instrumentation   TENDON REPAIR  JAN 2010   LEFT INDEX AND LONG FINGERS   THUMB SURGERY   04/07/2019   RIGHT THUMB   TUBAL LIGATION     Past Surgical History:  Procedure Laterality Date   ABDOMINAL HYSTERECTOMY  1987   W/ [place]   ANTERIOR CERVICAL DECOMP/DISCECTOMY FUSION N/A 08/09/2012   Procedure: ANTERIOR CERVICAL DECOMPRESSION/DISCECTOMY FUSION 1 LEVEL;  Surgeon: Floyce Stakes, MD;  [facility];  Service: Neurosurgery;  Laterality: N/A;  Cervical four-five  Anterior cervical decompression/diskectomy/fusion   ANTERIOR FUSION CERVICAL SPINE  2007   C5 -6   APPENDECTOMY     APPLICATION OF INTRAOPERATIVE CT SCAN N/A 05/17/2017   Procedure: APPLICATION OF INTRAOPERATIVE CT SCAN;  Surgeon:  Erline Levine, MD;  Location: [facility];  Service: Neurosurgery;  Laterality: N/A;   BACK SURGERY     x 5   BREAST EXCISIONAL BIOPSY Bilateral    No scar seen    BREAST SURGERY     x 2 biopsies   CARPAL TUNNEL RELEASE  RIGHT - 2001  & DEC 2011 W/ BACK SURG.   CERVICAL DISC SURGERY  2005   C5 - 6   CHOLECYSTECTOMY  1994   DORSAL COMPARTMENT RELEASE Right 04/07/2013   Procedure: RIGHT WRIST STS RELEASE;  Surgeon: Schuyler Amor, MD;  Location: [facility];  Service: Orthopedics;  Laterality: Right;  ENDOVENOUS ABLATION SAPHENOUS VEIN W/ LASER Right 05/29/2019   endovenous laser ablation right greater saphenous vein by Gae Gallop MD    EXPLORATION OF INCISION FOR CSF LEAK  DEC 2011  X2   POST [place] Right 04/07/2013   Procedure: RIGHT INDEX AND RIGHT LONG DISTAL INTERPHALANGEAL JOINT FUSIONS;  Surgeon: Schuyler Amor, MD;  Location: [facility];  Service: Orthopedics;  Laterality: Right;   FINGER ARTHROPLASTY Right 04/07/2013   Procedure: RIGHT THUMB [place] ARTHROPLASTY;  Surgeon: Schuyler Amor, MD;  Location: [facility];  Service: Orthopedics;  Laterality: Right;   GANGLION CYST EXCISION Right 04/07/2013   Procedure: RIGHT WRIST MASS EXCISION;  Surgeon: Schuyler Amor, MD;  Location: [facility];  Service: Orthopedics;  Laterality: Right;   KNEE ARTHROSCOPY  LEFT X3 (LAST ONE 2005)   KNEE ARTHROSCOPY  05/17/2011   Procedure: ARTHROSCOPY KNEE;  Surgeon: Bradley Ferris III;  Location: [facility];  Service: Orthopedics;  Laterality: Right;  WITH MEDIAL MENISECTOMY AND removal of suprapatella fat lump   LEFT WRIST TENOSYNECTOMY W/ LEFT THUMB JOINT REPAIR  02-24-10   LUMBAR FUSION  11-30-10   L4 - 5   LUMBAR FUSION  2000   L2 - 4   LUMBAR LAMINECTOMY  DEC 2011   L4 - 5   POSTERIOR CERVICAL FUSION/FORAMINOTOMY N/A 05/17/2017   Procedure: Cervical five  to Thorastic one  Posterior  cervical fusion with lateral mass screws/revision of prior instrumentation;  Surgeon: Erline Levine, MD;  Location: [facility];  Service: Neurosurgery;  Laterality: N/A;  C5 to T1 Posterior cervical fusion with lateral mass screws/revision of prior instrumentation   TENDON REPAIR  JAN 2010   LEFT INDEX AND LONG FINGERS   THUMB SURGERY   04/07/2019   RIGHT THUMB   TUBAL LIGATION     Past Medical History:  Diagnosis Date   Acute sinusitis, unspecified    Allergy    Anemia    "quite a few times"   Anxiety state, unspecified    Arachnoiditis BILATERAL LEGS   DUE TO MULTIPLE BACK SURG.'S   Arthritis    Asthma    last flare up was 03/2017 lasted over a month   Blood transfusion    Cancer ([place])    skin cancers (in scalp)   Cardiomyopathy HX --06/2010   EF was 25% during acute illness (PHELONEPHRITIS) Repeat echo 12-06-10 60% showed normal EF.    Chronic back pain greater than 3 months duration    S/P BACK SURG'S   CSF leak    Diabetes mellitus without complication ([place])    dx 2013 type 2   Dyslipidemia    Dysphagia    some post op cerv fusion 2/14   Dysrhythmia    Essential hypertension, benign    Family history of adverse reaction to anesthesia    mother gets n/v   GERD (gastroesophageal reflux disease) AND HIATIAL HERNIA   CONTROLLED W/ NEXIUM   Headache(784.0)    History of chronic bronchitis    Hx of bladder infections    Hyperlipidemia    TAKE CHLOSTEROL MEDICATION,04/23/19   Hypertension    Neuromuscular disorder (HCC)    numbness and tingling   Osteoporosis    Other malaise and fatigue    PONV (postoperative nausea and vomiting)    Shortness of breath    Spinal headache    Spinal stenosis, cervical region    Varicosities    venous  Weakness of both legs DUE TO ARACHNOIDITIS   OCCASIONAL USES CANE   Ht '5\' 5"'$  (1.651 m)   Wt 168 lb (76.2 kg)   BMI 27.96 kg/m   Opioid Risk Score:   Fall Risk Score:  `1  Depression screen PHQ 2/9     12/29/2021   12:59 PM  11/16/2021    2:15 PM 09/13/2021    1:39 PM 08/16/2021    1:04 PM 04/19/2021    1:34 PM 02/18/2021    1:27 PM 01/20/2021    1:50 PM  Depression screen PHQ 2/9  Decreased Interest 0 0 0 0 1 0 0  Down, Depressed, Hopeless 0 0 0 0 1 0 0  PHQ - 2 Score 0 0 0 0 2 0 0    Review of Systems  Musculoskeletal:  Positive for back pain and neck pain. Negative for gait problem.       Pain in both legs, both arms, both shoulders  All other systems reviewed and are negative.      Objective:   Physical Exam Vitals and nursing note reviewed.  Constitutional:      Appearance: Normal appearance.  Cardiovascular:     Rate and Rhythm: Normal rate and regular rhythm.     Pulses: Normal pulses.     Heart sounds: Normal heart sounds.  Pulmonary:     Effort: Pulmonary effort is normal.     Breath sounds: Normal breath sounds.  Musculoskeletal:     Cervical back: Normal range of motion and neck supple.     Comments: Normal Muscle Bulk and Muscle Testing Reveals:  Upper Extremities: Full ROM and Muscle Strength 5/5 Bilateral AC Joint Tenderness  Lumbar Hypersensitivity  Lower Extremities: Full ROM and Muscle Strength 5/5 Arises from Table with Slowly Narrow Based Gait     Skin:    General: Skin is warm and dry.  Neurological:     Mental Status: She is alert and oriented to person, place, and time.  Psychiatric:        Mood and Affect: Mood normal.        Behavior: Behavior normal.         Assessment & Plan:  1. On 05/17/2017 :C5C6C7 T1 Posterior Cervical Fusion with lateral mass fixation with AIRO Imaging, revision of prior instrumentation. By Dr. Vertell Limber.  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 Continue current medication regimen with  Gabapentin 800 mg BID. [place]: Oxycodone '15mg'$  one tablet 5 times a day  as needed for pain # 150 tablets 12/29/2021. We will continue the opioid monitoring program, this consists of regular clinic visits, examinations, urine  drug screen, pill counts as well as use of [place] Controlled Substance Reporting system. 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 Continue to Monitor. 12/29/2021 3. Anxiety/depression: PCP Following. Continue to monitor. 12/29/2021. 4. Muscle Spasms: Continue current medication regime with Flexeril. Continue to Monitor. 12/17/2021 5. Cervicalgia/ Cervical Radiculitis: Dr Vertell Limber Following. ,Continue current medication regime with Gabapentin:  S/P  C5C6C7 T1 Posterior Cervical Fusion with lateral mass fixation with AIRO Imaging, revision of prior instrumentation by Dr. Vertell Limber on 05/17/2017. 12/29/2021 7. Bilateral Thoracic Back Pain: Continue HEP as Tolerated and Continue current medication regimen. Continue to Monitor. 12/29/2021  8. Left lower extremity DVT/ Phlebitis:  S/P endovenous laser ablation of left great saphenous vein on 05/09/2018 by Dr.  Dickson:  Vascular  Following. 12/29/2021. 9. Insomnia: Continue Pamelor. Continue to Monitor. 12/29/2021. 10. Left Knee Pain: No complaints today. Ortho Following. 12/17/2021     F/U in 1 month

## 2022-01-05 DIAGNOSIS — G039 Meningitis, unspecified: Secondary | ICD-10-CM | POA: Diagnosis not present

## 2022-01-11 ENCOUNTER — Other Ambulatory Visit (INDEPENDENT_AMBULATORY_CARE_PROVIDER_SITE_OTHER): Payer: Self-pay

## 2022-01-11 ENCOUNTER — Encounter (INDEPENDENT_AMBULATORY_CARE_PROVIDER_SITE_OTHER): Payer: Self-pay | Admitting: *Deleted

## 2022-01-11 DIAGNOSIS — Z1211 Encounter for screening for malignant neoplasm of colon: Secondary | ICD-10-CM

## 2022-01-16 ENCOUNTER — Telehealth (INDEPENDENT_AMBULATORY_CARE_PROVIDER_SITE_OTHER): Payer: Self-pay

## 2022-01-16 ENCOUNTER — Other Ambulatory Visit (INDEPENDENT_AMBULATORY_CARE_PROVIDER_SITE_OTHER): Payer: Self-pay

## 2022-01-16 ENCOUNTER — Encounter (INDEPENDENT_AMBULATORY_CARE_PROVIDER_SITE_OTHER): Payer: Self-pay

## 2022-01-16 ENCOUNTER — Telehealth: Payer: Self-pay | Admitting: Internal Medicine

## 2022-01-16 MED ORDER — PEG 3350-KCL-NA BICARB-NACL 420 G PO SOLR: 4000.0000 mL | ORAL | 0 refills | Status: DC

## 2022-01-16 NOTE — Telephone Encounter (Signed)
Spoke with pt who is concerned about back pain she has had for the last 6 months and results of her upcomming CT scan scheduled for 01/18/22. Pt states on Sunday she had a coughing spell during church with sudden onset of nausea which has since resolved. Pt's brother was diagnosed with lung cancer recently and pt is worried she may have cancer as well. Pt was informed Dr. Chase Caller would be out of the office until mid August, then pt was scheduled to see Roxan Diesel on Aug 11 th for OV. Pt stated understanding and was instructed to seek emergency evaluation if pain became intolerable or she developed SOB. Nothing further needed at this time.   Routing to [employer] as [employer]

## 2022-01-16 NOTE — Telephone Encounter (Signed)
ATC LVMTCB x 1  

## 2022-01-16 NOTE — Telephone Encounter (Signed)
Patient calling to let Dr. Chase Caller know she's been having pain in her back. She's not sure if it's the kidney or lung. She's also been having pain in the middle of her chest under her breast. Her brother was recently diagnosed with lung cancer. She wants to know if MR still wants her to get the CT w/o contrast and if she should come in for an appt with.

## 2022-01-16 NOTE — Telephone Encounter (Signed)
Dream Harman Ann Shalla Bulluck, CMA  ?

## 2022-01-16 NOTE — Telephone Encounter (Signed)
Referring MD/PCP: Hasanaj  Procedure: Tcs  Reason/Indication:  Screening  Has patient had this procedure before?  No   If so, when, by whom and where?    Is there a family history of colon cancer?  NO   Who?  What age when diagnosed?    Is patient diabetic? If yes, Type 1 or Type 2   yes, type 2       Does patient have prosthetic heart valve or mechanical valve?  No   Do you have a pacemaker/defibrillator?  No   Has patient ever had endocarditis/atrial fibrillation? No   Does patient use oxygen? No   Has patient had joint replacement within last 12 months?  No   Is patient constipated or do they take laxatives? No   Does patient have a history of alcohol/drug use?  No   Have you had a stroke/heart attack last 6 mths? No   Do you take medicine for weight loss?  No   For female patients,: have you had a hysterectomy yes                       are you post menopausal yes                      do you still have your menstrual cycle no   Is patient on blood thinner such as Coumadin, Plavix and/or Aspirin? yes  Medications: asa 81 mg daily, albuterol 2.5 mg solution prn, albuterol 109 mcg inhaler prn, Budesonide-formoterol 160-4.5 mcg/act inhaler 2 puffs into lungs bid, Vit D 1200-1000 mg chew 1 500 mg bid w/food, montelukast 10 mg daily, nalozone 4 mg prn, nortriptyline 25 mg qhs , omeprazole 40 mg daily, oxycodone '15mg'$  4 times daily prn pain, promethazine 25 prn, simvastatin 10 mg daily, topirate 25 mg bid, calacyclovir 500 mg daily  Allergies: See Epic  Medication Adjustment per Dr Jenetta Downer none  Procedure date & time: 02/14/22 at 10:15

## 2022-01-18 ENCOUNTER — Encounter: Payer: Self-pay | Admitting: Physical Medicine & Rehabilitation

## 2022-01-18 ENCOUNTER — Ambulatory Visit
Admission: RE | Admit: 2022-01-18 | Discharge: 2022-01-18 | Disposition: A | Discharge status: Home / Self Care | Payer: Medicare Other | Source: Ambulatory Visit | Attending: Internal Medicine | Admitting: Internal Medicine

## 2022-01-18 ENCOUNTER — Encounter: Payer: Medicare Other | Attending: Physical Medicine & Rehabilitation | Admitting: Physical Medicine & Rehabilitation

## 2022-01-18 VITALS — BP 117/73 | HR 92 | Ht 65.0 in

## 2022-01-18 DIAGNOSIS — I700 Atherosclerosis of aorta: Secondary | ICD-10-CM | POA: Diagnosis not present

## 2022-01-18 DIAGNOSIS — J4540 Moderate persistent asthma, uncomplicated: Secondary | ICD-10-CM

## 2022-01-18 DIAGNOSIS — G039 Meningitis, unspecified: Secondary | ICD-10-CM | POA: Insufficient documentation

## 2022-01-18 DIAGNOSIS — R918 Other nonspecific abnormal finding of lung field: Secondary | ICD-10-CM

## 2022-01-18 DIAGNOSIS — M961 Postlaminectomy syndrome, not elsewhere classified: Secondary | ICD-10-CM | POA: Diagnosis not present

## 2022-01-18 DIAGNOSIS — R911 Solitary pulmonary nodule: Secondary | ICD-10-CM | POA: Diagnosis not present

## 2022-01-18 MED ORDER — NORTRIPTYLINE HCL 25 MG PO CAPS: 50.0000 mg | ORAL_CAPSULE | Freq: Every day | ORAL | 1 refills | Status: DC

## 2022-01-18 MED ORDER — GABAPENTIN 800 MG PO TABS: 800.0000 mg | ORAL_TABLET | Freq: Three times a day (TID) | ORAL | 3 refills | Status: DC

## 2022-01-18 NOTE — Patient Instructions (Signed)
PLEASE FEEL FREE TO CALL OUR OFFICE WITH ANY PROBLEMS OR QUESTIONS ([phone])      

## 2022-01-18 NOTE — Progress Notes (Signed)
Subjective:    Patient ID: [name] [name], female    DOB: 09-20-1950, 71 y.o.   MRN: [id number]  HPI  [name] [name] is here in follow up of her chronic pain. She saw Dr. Ellene Route in follow up. There's not a lot to do from an interventional standpoint given the severity of her disease which is now involving her thoracic spine. She has pain with basic mobvement and ROM. She finds it difficult to turn her neck consistently.   She tries to stay as active as she can despite the pain.   She remains on gabapentin  as well as oxycodone and nortriptyline for neuropathic pain. Topamax really helped her headaches.   Pain Inventory Average Pain 7 Pain Right Now 8 My pain is sharp, burning, stabbing, and tingling  In the last 24 hours, has pain interfered with the following? General activity 7 Relation with others 7 Enjoyment of life 7 What TIME of day is your pain at its worst? morning , daytime, evening, and night Sleep (in general) Fair  Pain is worse with: walking, bending, sitting, inactivity, standing, and some activites Pain improves with: rest, heat/ice, medication, and walking Relief from Meds: 7  Family History  Problem Relation Age of Onset   Hypertension Mother    Heart disease Mother        Mitral valve replacement.    Diabetes Mother    Cancer Father    Diabetes Brother    Colon cancer Neg Hx    Colon polyps Neg Hx    Esophageal cancer Neg Hx    Rectal cancer Neg Hx    Stomach cancer Neg Hx    Social History   Socioeconomic History   Marital status: Divorced    Spouse name: Not on file   Number of children: Not on file   Years of education: Not on file   Highest education level: Not on file  Occupational History   Occupation: disable    Employer: DISABLED  Tobacco Use   Smoking status: Never   Smokeless tobacco: Never  Vaping Use   Vaping Use: Never used  Substance and Sexual Activity   Alcohol use: No   Drug use: No   Sexual activity: Not on file    Comment:  second hand smoke  Other Topics Concern   Not on file  Social History Narrative   Has one child lives at home with her.    Social Determinants of Health   Financial Resource Strain: Not on file  Food Insecurity: Not on file  Transportation Needs: Not on file  Physical Activity: Not on file  Stress: Not on file  Social Connections: Not on file   Past Surgical History:  Procedure Laterality Date   ABDOMINAL HYSTERECTOMY  1987   W/ [place] DECOMP/DISCECTOMY FUSION N/A 08/09/2012   Procedure: ANTERIOR CERVICAL DECOMPRESSION/DISCECTOMY FUSION 1 LEVEL;  Surgeon: Floyce Stakes, MD;  [facility];  Service: Neurosurgery;  Laterality: N/A;  Cervical four-five  Anterior cervical decompression/diskectomy/fusion   ANTERIOR FUSION CERVICAL SPINE  2007   C5 -6   APPENDECTOMY     APPLICATION OF INTRAOPERATIVE CT SCAN N/A 05/17/2017   Procedure: APPLICATION OF INTRAOPERATIVE CT SCAN;  Surgeon: Erline Levine, MD;  Location: [place];  Service: Neurosurgery;  Laterality: N/A;   BACK SURGERY     x 5   BREAST EXCISIONAL BIOPSY Bilateral    No scar seen    BREAST SURGERY  x 2 biopsies   CARPAL TUNNEL RELEASE  RIGHT - 2001  & DEC 2011 W/ BACK SURG.   CERVICAL DISC SURGERY  2005   C5 - 6   CHOLECYSTECTOMY  1994   DORSAL COMPARTMENT RELEASE Right 04/07/2013   Procedure: RIGHT WRIST STS RELEASE;  Surgeon: Schuyler Amor, MD;  Location: [place];  Service: Orthopedics;  Laterality: Right;   ENDOVENOUS ABLATION SAPHENOUS VEIN W/ LASER Right 05/29/2019   endovenous laser ablation right greater saphenous vein by Gae Gallop MD    EXPLORATION OF INCISION FOR CSF LEAK  DEC 2011  X2   POST [place] Right 04/07/2013   Procedure: RIGHT INDEX AND RIGHT LONG DISTAL INTERPHALANGEAL JOINT FUSIONS;  Surgeon: Schuyler Amor, MD;  Location: [place];  Service: Orthopedics;  Laterality: Right;   FINGER ARTHROPLASTY  Right 04/07/2013   Procedure: RIGHT THUMB [place] ARTHROPLASTY;  Surgeon: Schuyler Amor, MD;  Location: [place];  Service: Orthopedics;  Laterality: Right;   GANGLION CYST EXCISION Right 04/07/2013   Procedure: RIGHT WRIST MASS EXCISION;  Surgeon: Schuyler Amor, MD;  Location: [place];  Service: Orthopedics;  Laterality: Right;   KNEE ARTHROSCOPY  LEFT X3 (LAST ONE 2005)   KNEE ARTHROSCOPY  05/17/2011   Procedure: ARTHROSCOPY KNEE;  Surgeon: Bradley Ferris III;  Location: [place];  Service: Orthopedics;  Laterality: Right;  WITH MEDIAL MENISECTOMY AND removal of suprapatella fat lump   LEFT WRIST TENOSYNECTOMY W/ LEFT THUMB JOINT REPAIR  02-24-10   LUMBAR FUSION  11-30-10   L4 - 5   LUMBAR FUSION  2000   L2 - 4   LUMBAR LAMINECTOMY  DEC 2011   L4 - 5   POSTERIOR CERVICAL FUSION/FORAMINOTOMY N/A 05/17/2017   Procedure: Cervical five  to Thorastic one  Posterior cervical fusion with lateral mass screws/revision of prior instrumentation;  Surgeon: Erline Levine, MD;  Location: [place];  Service: Neurosurgery;  Laterality: N/A;  C5 to T1 Posterior cervical fusion with lateral mass screws/revision of prior instrumentation   TENDON REPAIR  JAN 2010   LEFT INDEX AND LONG FINGERS   THUMB SURGERY   04/07/2019   RIGHT THUMB   TUBAL LIGATION     Past Surgical History:  Procedure Laterality Date   ABDOMINAL HYSTERECTOMY  1987   W/ BSO   ANTERIOR CERVICAL DECOMP/DISCECTOMY FUSION N/A 08/09/2012   Procedure: ANTERIOR CERVICAL DECOMPRESSION/DISCECTOMY FUSION 1 LEVEL;  Surgeon: Floyce Stakes, MD;  [facility];  Service: Neurosurgery;  Laterality: N/A;  Cervical four-five  Anterior cervical decompression/diskectomy/fusion   ANTERIOR FUSION CERVICAL SPINE  2007   C5 -6   APPENDECTOMY     APPLICATION OF INTRAOPERATIVE CT SCAN N/A 05/17/2017   Procedure: APPLICATION OF INTRAOPERATIVE CT SCAN;  Surgeon: Erline Levine, MD;  Location:  [place];  Service: Neurosurgery;  Laterality: N/A;   BACK SURGERY     x 5   BREAST EXCISIONAL BIOPSY Bilateral    No scar seen    BREAST SURGERY     x 2 biopsies   CARPAL TUNNEL RELEASE  RIGHT - 2001  & DEC 2011 W/ BACK SURG.   CERVICAL DISC SURGERY  2005   C5 - 6   CHOLECYSTECTOMY  1994   DORSAL COMPARTMENT RELEASE Right 04/07/2013   Procedure: RIGHT WRIST STS RELEASE;  Surgeon: Schuyler Amor, MD;  Location: [place];  Service: Orthopedics;  Laterality: Right;  ENDOVENOUS ABLATION SAPHENOUS VEIN W/ LASER Right 05/29/2019   endovenous laser ablation right greater saphenous vein by Gae Gallop MD    EXPLORATION OF INCISION FOR CSF LEAK  DEC 2011  X2   POST [place] Right 04/07/2013   Procedure: RIGHT INDEX AND RIGHT LONG DISTAL INTERPHALANGEAL JOINT FUSIONS;  Surgeon: Schuyler Amor, MD;  Location: [place];  Service: Orthopedics;  Laterality: Right;   FINGER ARTHROPLASTY Right 04/07/2013   Procedure: RIGHT THUMB [place] ARTHROPLASTY;  Surgeon: Schuyler Amor, MD;  Location: [place];  Service: Orthopedics;  Laterality: Right;   GANGLION CYST EXCISION Right 04/07/2013   Procedure: RIGHT WRIST MASS EXCISION;  Surgeon: Schuyler Amor, MD;  Location: [place];  Service: Orthopedics;  Laterality: Right;   KNEE ARTHROSCOPY  LEFT X3 (LAST ONE 2005)   KNEE ARTHROSCOPY  05/17/2011   Procedure: ARTHROSCOPY KNEE;  Surgeon: Bradley Ferris III;  Location: [place];  Service: Orthopedics;  Laterality: Right;  WITH MEDIAL MENISECTOMY AND removal of suprapatella fat lump   LEFT WRIST TENOSYNECTOMY W/ LEFT THUMB JOINT REPAIR  02-24-10   LUMBAR FUSION  11-30-10   L4 - 5   LUMBAR FUSION  2000   L2 - 4   LUMBAR LAMINECTOMY  DEC 2011   L4 - 5   POSTERIOR CERVICAL FUSION/FORAMINOTOMY N/A 05/17/2017   Procedure: Cervical five  to Thorastic one  Posterior cervical fusion with lateral  mass screws/revision of prior instrumentation;  Surgeon: Erline Levine, MD;  Location: [place];  Service: Neurosurgery;  Laterality: N/A;  C5 to T1 Posterior cervical fusion with lateral mass screws/revision of prior instrumentation   TENDON REPAIR  JAN 2010   LEFT INDEX AND LONG FINGERS   THUMB SURGERY   04/07/2019   RIGHT THUMB   TUBAL LIGATION     Past Medical History:  Diagnosis Date   Acute sinusitis, unspecified    Allergy    Anemia    "quite a few times"   Anxiety state, unspecified    Arachnoiditis BILATERAL LEGS   DUE TO MULTIPLE BACK SURG.'S   Arthritis    Asthma    last flare up was 03/2017 lasted over a month   Blood transfusion    Cancer ([place])    skin cancers (in scalp)   Cardiomyopathy HX --06/2010   EF was 25% during acute illness (PHELONEPHRITIS) Repeat echo 12-06-10 60% showed normal EF.    Chronic back pain greater than 3 months duration    S/P BACK SURG'S   CSF leak    Diabetes mellitus without complication ([place])    dx 2013 type 2   Dyslipidemia    Dysphagia    some post op cerv fusion 2/14   Dysrhythmia    Essential hypertension, benign    Family history of adverse reaction to anesthesia    mother gets n/v   GERD (gastroesophageal reflux disease) AND HIATIAL HERNIA   CONTROLLED W/ NEXIUM   Headache(784.0)    History of chronic bronchitis    Hx of bladder infections    Hyperlipidemia    TAKE CHLOSTEROL MEDICATION,04/23/19   Hypertension    Neuromuscular disorder (HCC)    numbness and tingling   Osteoporosis    Other malaise and fatigue    PONV (postoperative nausea and vomiting)    Shortness of breath    Spinal headache    Spinal stenosis, cervical region    Varicosities    venous  Weakness of both legs DUE TO ARACHNOIDITIS   OCCASIONAL USES CANE   BP 117/73   Pulse 92   Ht '5\' 5"'$  (1.651 m)   SpO2 92%   BMI 27.96 kg/m   Opioid Risk Score:   Fall Risk Score:  `1  Depression screen PHQ 2/9     12/29/2021   12:59 PM 11/16/2021    2:15  PM 09/13/2021    1:39 PM 08/16/2021    1:04 PM 04/19/2021    1:34 PM 02/18/2021    1:27 PM 01/20/2021    1:50 PM  Depression screen PHQ 2/9  Decreased Interest 0 0 0 0 1 0 0  Down, Depressed, Hopeless 0 0 0 0 1 0 0  PHQ - 2 Score 0 0 0 0 2 0 0     Review of Systems  Musculoskeletal:  Positive for back pain and myalgias.  All other systems reviewed and are negative.     Objective:   Physical Exam General: No acute distress HEENT: NCAT, EOMI, oral membranes moist Cards: reg rate  Chest: normal effort Abdomen: Soft, NT, ND Skin: dry, intact Extremities: no edema Psych: pleasant and appropriate  Skin: intact Neuro: UE motor 5/5. LE remains limited by pain 3+ to 4/5. Decreased LT in lower ext. DTR's absent in LE's Musculoskeletal:  LB with limited ROM, 10 degrees flex and extension. Continued atrophy of cervical paraspinals . Cervical and occipital scalp tender to touch on right.  Right 1st and 2nd finger surgery sites along DIP's with chronic arthritis pain.             Assessment & Plan:  1. Hx: 05/17/2017 :C5C6C7 T1 Posterior Cervical Fusion with lateral mass fixation with AIRO Imaging, revision of prior instrumentation. By Dr. Vertell Limber.   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              - Gabapentin 800 mg TID.  RF today               - Oxycodone '15mg'$  one tablet every 6 hours as needed #120 RF today            We will continue the controlled substance monitoring program, this consists of regular clinic visits, examinations, routine drug screening, pill counts as well as use of [place] Controlled Substance Reporting System. [place] was [place] today.              -Medication was recently filled               -increase nortriptyline to '50mg'$  to help with neuropathic pain. 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             -activity to tolerance 3. . Anxiety/depression:  PCP  Following.            -is coping fairly well at present   4. Muscle Spasms:  No flexeril d/t GI upset.    5. Cervicalgia/ Cervical Radiculitis/headache:  Dr. Vertell Limber Following: S/P  C5C6C7 T1 Posterior Cervical Fusion with lateral mass fixation with AIRO Imaging, revision of prior instrumentation by Dr. Vertell Limber on 05/17/2017.              -gabapentin as above             -maintain topamax '25mg'$  bid as above..   7. Left hand OA, trigger fingers---per hand surgery   8. Left lower extremity DVT/ Phlebitis:  S/P endovenous laser ablation  of left great saphenous vein on 05/09/2018 by Dr. Scot Dock:      15 minutes of face to face patient care time were spent during this visit. All questions were encouraged and answered.  Follow up with NP .

## 2022-01-26 NOTE — Progress Notes (Signed)
IMPRESSION: Numerous very tiny pulmonary nodules, measuring no greater than 0.2 cm, predominantly in the dependent bilateral lung bases and the majority finely calcified. These nodules are benign, and no further specific follow-up or characterization is required.  Aortic Atherosclerosis (ICD10-I70.0).  PLAN  - to be discussed during OV with Margrett Rud Aug 2023.   - results willl NOT be called in

## 2022-01-27 ENCOUNTER — Ambulatory Visit: Payer: Medicare Other | Admitting: Nurse Practitioner

## 2022-01-31 ENCOUNTER — Telehealth (INDEPENDENT_AMBULATORY_CARE_PROVIDER_SITE_OTHER): Payer: Self-pay | Admitting: Gastroenterology

## 2022-01-31 DIAGNOSIS — M542 Cervicalgia: Secondary | ICD-10-CM | POA: Diagnosis not present

## 2022-01-31 NOTE — Telephone Encounter (Signed)
Patient called the office stated she fell and is hurting all over - going to doctor - wants to cancel and re-schedule her procedure - please advise - ph# [phone]

## 2022-02-01 ENCOUNTER — Other Ambulatory Visit: Payer: Self-pay | Admitting: Internal Medicine

## 2022-02-01 ENCOUNTER — Other Ambulatory Visit (HOSPITAL_COMMUNITY): Payer: Self-pay | Admitting: Internal Medicine

## 2022-02-01 DIAGNOSIS — M542 Cervicalgia: Secondary | ICD-10-CM

## 2022-02-03 ENCOUNTER — Ambulatory Visit: Payer: Medicare Other | Admitting: Nurse Practitioner

## 2022-02-05 NOTE — Progress Notes (Signed)
REFERRING PROVIDER: Kalman Shan, MD [address] [place],  [place] [address]  PRIMARY PROVIDER:  Toma Deiters, MD  PRIMARY REASON FOR VISIT:  Encounter Diagnoses  Name Primary?   Family history of ovarian cancer    Family history of breast cancer    Family history of lung cancer    Family history of liver cancer      HISTORY OF PRESENT ILLNESS:   [name], a 71 y.o. female, was seen for a [place] cancer genetics consultation at the request of Dr. Marchelle Gearing due to a family history of cancer.  [name] presents to clinic today to discuss the possibility of a hereditary predisposition to cancer, to discuss genetic testing, and to further clarify her future cancer risks, as well as potential cancer risks for family members.   [name] is a 71 y.o. female with a personal history of skin cancer (non-melanoma) on her head and hand, diagnosed after 52.  She has a history of pulmonary nodules.    RISK FACTORS:  Mammogram within the last year: yes; category b breast density  Number of breast biopsies: 2; benign per patient Colonoscopy: no;  first one scheduled . Hysterectomy: yes.  Ovaries intact: no; removed in 1982 due to endometriosis Menarche was at age 60.  First live birth at age 66.  OCP use for approximately  10  years.  HRT use: yes; 32 years; stopped last year  Dermatology screening: as needed   Past Medical History:  Diagnosis Date   Acute sinusitis, unspecified    Allergy    Anemia    "quite a few times"   Anxiety state, unspecified    Arachnoiditis BILATERAL LEGS   DUE TO MULTIPLE BACK SURG.'S   Arthritis    Asthma    last flare up was 03/2017 lasted over a month   Blood transfusion    Cancer (HCC)    skin cancers (in scalp)   Cardiomyopathy HX --06/2010   EF was 25% during acute illness (PHELONEPHRITIS) Repeat echo 12-06-10 60% showed normal EF.    Chronic back pain greater than 3 months duration    S/P BACK SURG'S   CSF leak     Diabetes mellitus without complication (HCC)    dx 2013 type 2   Dyslipidemia    Dysphagia    some post op cerv fusion 2/14   Dysrhythmia    Essential hypertension, benign    Family history of adverse reaction to anesthesia    mother gets n/v   GERD (gastroesophageal reflux disease) AND HIATIAL HERNIA   CONTROLLED W/ NEXIUM   Headache(784.0)    History of chronic bronchitis    Hx of bladder infections    Hyperlipidemia    TAKE CHLOSTEROL MEDICATION,04/23/19   Hypertension    Neuromuscular disorder (HCC)    numbness and tingling   Osteoporosis    Other malaise and fatigue    PONV (postoperative nausea and vomiting)    Shortness of breath    Spinal headache    Spinal stenosis, cervical region    Varicosities    venous   Weakness of both legs DUE TO ARACHNOIDITIS   OCCASIONAL USES CANE    Past Surgical History:  Procedure Laterality Date   ABDOMINAL HYSTERECTOMY  1987   W/ BSO   ANTERIOR CERVICAL DECOMP/DISCECTOMY FUSION N/A 08/09/2012   Procedure: ANTERIOR CERVICAL DECOMPRESSION/DISCECTOMY FUSION 1 LEVEL;  Surgeon: Karn Cassis, MD;  [facility];  Service: Neurosurgery;  Laterality: N/A;  Cervical four-five  Anterior cervical decompression/diskectomy/fusion   ANTERIOR FUSION CERVICAL SPINE  2007   C5 -6   APPENDECTOMY     APPLICATION OF INTRAOPERATIVE CT SCAN N/A 05/17/2017   Procedure: APPLICATION OF INTRAOPERATIVE CT SCAN;  Surgeon: Maeola Harman, MD;  Location: [facility];  Service: Neurosurgery;  Laterality: N/A;   BACK SURGERY     x 5   BREAST EXCISIONAL BIOPSY Bilateral    No scar seen    BREAST SURGERY     x 2 biopsies   CARPAL TUNNEL RELEASE  RIGHT - 2001  & DEC 2011 W/ BACK SURG.   CERVICAL DISC SURGERY  2005   C5 - 6   CHOLECYSTECTOMY  1994   DORSAL COMPARTMENT RELEASE Right 04/07/2013   Procedure: RIGHT WRIST STS RELEASE;  Surgeon: Marlowe Shores, MD;  Location: [facility];  Service: Orthopedics;  Laterality: Right;    ENDOVENOUS ABLATION SAPHENOUS VEIN W/ LASER Right 05/29/2019   endovenous laser ablation right greater saphenous vein by Lien Lyman Caraway MD    EXPLORATION OF INCISION FOR CSF LEAK  DEC 2011  X2   POST LAMINECOTMY   FINGER ARTHRODESIS Right 04/07/2013   Procedure: RIGHT INDEX AND RIGHT LONG DISTAL INTERPHALANGEAL JOINT FUSIONS;  Surgeon: Marlowe Shores, MD;  Location: [facility];  Service: Orthopedics;  Laterality: Right;   FINGER ARTHROPLASTY Right 04/07/2013   Procedure: RIGHT THUMB [facility] ARTHROPLASTY;  Surgeon: Marlowe Shores, MD;  Location: [facility];  Service: Orthopedics;  Laterality: Right;   GANGLION CYST EXCISION Right 04/07/2013   Procedure: RIGHT WRIST MASS EXCISION;  Surgeon: Marlowe Shores, MD;  Location: [facility];  Service: Orthopedics;  Laterality: Right;   KNEE ARTHROSCOPY  LEFT X3 (LAST ONE 2005)   KNEE ARTHROSCOPY  05/17/2011   Procedure: ARTHROSCOPY KNEE;  Surgeon: Curlene Labrum III;  Location: [facility];  Service: Orthopedics;  Laterality: Right;  WITH MEDIAL MENISECTOMY AND removal of suprapatella fat lump   LEFT WRIST TENOSYNECTOMY W/ LEFT THUMB JOINT REPAIR  02-24-10   LUMBAR FUSION  11-30-10   L4 - 5   LUMBAR FUSION  2000   L2 - 4   LUMBAR LAMINECTOMY  DEC 2011   L4 - 5   POSTERIOR CERVICAL FUSION/FORAMINOTOMY N/A 05/17/2017   Procedure: Cervical five  to Thorastic one  Posterior cervical fusion with lateral mass screws/revision of prior instrumentation;  Surgeon: Maeola Harman, MD;  Location: [facility];  Service: Neurosurgery;  Laterality: N/A;  C5 to T1 Posterior cervical fusion with lateral mass screws/revision of prior instrumentation   TENDON REPAIR  JAN 2010   LEFT INDEX AND LONG FINGERS   THUMB SURGERY   04/07/2019   RIGHT THUMB   TUBAL LIGATION        FAMILY HISTORY:  We obtained a detailed, 4-generation family history.  Significant diagnoses are listed below: Family History  Problem  Relation Age of Onset   Liver cancer Father 63       HCC   Lung cancer Brother 63       mets   Melanoma Brother 65       neck and back   Cancer Paternal Uncle        unknown type; dx after 49   Breast cancer Paternal Grandmother        dx 58s   Ovarian cancer Cousin        paternal cousin; d. 34s  Cancer Cousin        GYN; d. 71s     Ms. Mcclurkin is unaware of previous family history of genetic testing for hereditary cancer risks.  There is no reported Ashkenazi Jewish ancestry. There is no known consanguinity.  GENETIC COUNSELING ASSESSMENT: Ms. Umphlett is a 71 y.o. female with a family history of cancer which is somewhat suggestive of a hereditary cancer syndrome and predisposition to cancer given the presence of related cancers in her paternal family. We, therefore, discussed and recommended the following at today's visit.   DISCUSSION: We discussed that 5 - 10% of cancer is hereditary, with most cases of hereditary breast and ovarian cancer associated with mutations in BRCA1/2.  There are other genes that are associated with breast and GYN cancer syndromes.  We discussed that testing is beneficial for several reasons, including knowing about other cancer risks, identifying potential screening and risk-reduction options that may be appropriate, and to understanding if other family members could be at risk for cancer and allowing them to undergo genetic testing.  We reviewed the characteristics, features and inheritance patterns of hereditary cancer syndromes. We also discussed genetic testing, including the appropriate family members to test, the process of testing, insurance coverage and turn-around-time for results. We discussed the implications of a negative, positive, carrier and/or variant of uncertain significant result. We discussed that negative results would be uninformative given that Ms. Yagi does not have a personal history of cancer. We recommended Ms. Neuhaus pursue genetic testing  for a panel that contains genes associated with breast, ovarian, and other cancers.  The CancerNext-Expanded gene panel offered by [facility] and includes sequencing, rearrangement, and RNA analysis for the following 77 genes: AIP, ALK, APC, ATM, AXIN2, BAP1, BARD1, BLM, BMPR1A, BRCA1, BRCA2, BRIP1, CDC73, CDH1, CDK4, CDKN1B, CDKN2A, CHEK2, CTNNA1, DICER1, FANCC, FH, FLCN, GALNT12, KIF1B, LZTR1, MAX, MEN1, MET, MLH1, MSH2, MSH3, MSH6, MUTYH, NBN, NF1, NF2, NTHL1, PALB2, PHOX2B, PMS2, POT1, PRKAR1A, PTCH1, PTEN, RAD51C, RAD51D, RB1, RECQL, RET, SDHA, SDHAF2, SDHB, SDHC, SDHD, SMAD4, SMARCA4, SMARCB1, SMARCE1, STK11, SUFU, TMEM127, TP53, TSC1, TSC2, VHL and XRCC2 (sequencing and deletion/duplication); EGFR, EGLN1, HOXB13, KIT, MITF, PDGFRA, POLD1, and POLE (sequencing only); EPCAM and GREM1 (deletion/duplication only).    Based on Ms. Melnik's family history of cancer, she meets medical criteria for genetic testing.   We discussed that some people do not want to undergo genetic testing due to fear of genetic discrimination.  A federal law called the Genetic Information Non-Discrimination Act (GINA) of 2008 helps protect individuals against genetic discrimination based on their genetic test results.  It impacts both health insurance and employment.  With health insurance, it protects against increased premiums, being kicked off insurance or being forced to take a test in order to be insured.  For employment it protects against hiring, firing and promoting decisions based on genetic test results.  GINA does not apply to those in the [employer], those who work for companies with less than 15 employees, and new life insurance or long-term disability insurance policies.  Health status due to a cancer diagnosis is not protected under GINA.  PLAN: After considering the risks, benefits, and limitations, Ms. Tsang provided informed consent to pursue genetic testing and the blood sample was sent to [facility]  for analysis of the CancerNext-Expanded +RNA Panel. Results should be available within approximately 3 weeks' time, at which point they will be disclosed by telephone to Ms. Koone, as will any additional recommendations warranted by these results. Ms.  Risse will receive a summary of her genetic counseling visit and a copy of her results once available. This information will also be available in Epic.    Ms. Joffe questions were answered to her satisfaction today. Our contact information was provided should additional questions or concerns arise. Thank you for the referral and allowing [place] to share in the care of your patient.   Suzi Hernan M. Joette Catching, [place], [facility] Genetic Counselor [email or web address] (P) [phone]   The patient was seen for a total of 30 minutes in face-to-face genetic counseling.  The patient was seen alone.  Drs. Lindi Adie and/or Burr Medico were available to discuss this case as needed.  _______________________________________________________________________ For Office Staff:  Number of people involved in session: 1 Was an Intern/ student involved with case: no

## 2022-02-06 ENCOUNTER — Inpatient Hospital Stay: Payer: Medicare Other

## 2022-02-06 ENCOUNTER — Inpatient Hospital Stay: Payer: Medicare Other | Attending: Genetic Counselor | Admitting: Genetic Counselor

## 2022-02-06 ENCOUNTER — Other Ambulatory Visit: Payer: Self-pay

## 2022-02-06 ENCOUNTER — Other Ambulatory Visit: Payer: Self-pay | Admitting: Genetic Counselor

## 2022-02-06 ENCOUNTER — Encounter: Payer: Self-pay | Admitting: Genetic Counselor

## 2022-02-06 DIAGNOSIS — Z800 Family history of malignant neoplasm of digestive organs: Secondary | ICD-10-CM | POA: Diagnosis not present

## 2022-02-06 DIAGNOSIS — Z803 Family history of malignant neoplasm of breast: Secondary | ICD-10-CM

## 2022-02-06 DIAGNOSIS — Z801 Family history of malignant neoplasm of trachea, bronchus and lung: Secondary | ICD-10-CM

## 2022-02-06 DIAGNOSIS — Z8041 Family history of malignant neoplasm of ovary: Secondary | ICD-10-CM

## 2022-02-06 HISTORY — DX: Family history of malignant neoplasm of ovary: Z80.41

## 2022-02-06 HISTORY — DX: Family history of malignant neoplasm of trachea, bronchus and lung: Z80.1

## 2022-02-06 HISTORY — DX: Family history of malignant neoplasm of breast: Z80.3

## 2022-02-06 HISTORY — DX: Family history of malignant neoplasm of digestive organs: Z80.0

## 2022-02-06 LAB — GENETIC SCREENING ORDER

## 2022-02-08 ENCOUNTER — Encounter (INDEPENDENT_AMBULATORY_CARE_PROVIDER_SITE_OTHER): Payer: Self-pay

## 2022-02-08 ENCOUNTER — Ambulatory Visit (INDEPENDENT_AMBULATORY_CARE_PROVIDER_SITE_OTHER): Payer: Medicare Other | Admitting: Nurse Practitioner

## 2022-02-08 ENCOUNTER — Ambulatory Visit (HOSPITAL_COMMUNITY): Payer: Medicare Other

## 2022-02-08 ENCOUNTER — Encounter: Payer: Self-pay | Admitting: Nurse Practitioner

## 2022-02-08 ENCOUNTER — Encounter (HOSPITAL_COMMUNITY): Payer: Self-pay

## 2022-02-08 ENCOUNTER — Other Ambulatory Visit (INDEPENDENT_AMBULATORY_CARE_PROVIDER_SITE_OTHER): Payer: Self-pay

## 2022-02-08 DIAGNOSIS — J4530 Mild persistent asthma, uncomplicated: Secondary | ICD-10-CM

## 2022-02-08 DIAGNOSIS — R911 Solitary pulmonary nodule: Secondary | ICD-10-CM

## 2022-02-08 NOTE — Progress Notes (Signed)
$'@Patient'r$  ID: [name] [name], female    DOB: Oct 12, 1950, 71 y.o.   MRN: [id number]  Chief Complaint  Patient presents with   Follow-up    Follow up.    Referring provider: Neale Burly, MD  HPI: 71 year old female, never smoker followed for asthma, lung nodules. She is a patient of Dr. Golden Pop and last seen in office on 12/21/2021. Past medical history significant for HTN, arachnoiditis, anxiety, depression, HLD. Father had liver cancer; brother is a never smoker and recently diagnosed with stage IV NSCLC.   TEST/EVENTS:  05/27/2012 PFTs: FVC  08/07/2012 methacholine challenge: positive airway hyperresponsiveness  08/19/2014 spirometry: FVC 85%, FEV1 89%, ratio 80% 05/07/2020 echo: EF 55-60%. GIDD. RV size and function nl. Trivial MR. 01/18/2022 CT chest wo contrast: no LAD. There are numerous very tiny pulmonary nodules, largest measuring 2 mm, bilaterally. Majority are calcified. These appear stable when compared to previous imaging. Mild, bland bandlike scarring in lung bases.   12/21/2021: OV with Dr. Chase Caller. Regarding asthma, breathing is stable. She did have some increased dyspnea with the poor air quality and heat of the summer. Continued on Symbicort and prn albuterol. She was very concerned because she has a history of lung nodules and her brother was recently diagnosed with lung cancer. She wants a PET scan; discussed that insurance would likely not cover this. She was agreeable to repeat CT chest. Discussed genetic counseling - referral placed.  02/08/2022: Today - follow up Patient presents today for follow up to discuss her CT scan results, which showed stable pulmonary nodules. She was very concerned given her brother's recent diagnosis. He had experienced weight loss and chest discomfort; found to have a liver lesion, which he had biopsied and subsequently found to have stage IV NSCLC. Currently undergoing treatment. She reports that he was a never smoker but did have second  hand smoke exposure and worked in a factory for many years, so unsure what he was exposed to. Her father was also a never smoker and did not drink alcohol and was diagnosed with liver cancer. She saw the geneticist, who ordered many labs, which she is currently waiting on results from. Today, she reports that her breathing has been stable. She thinks her previous back pain was related to her arachnoiditis. This has since gotten a little bit better; followed by Dr. Ellene Route. No recent cough, wheezing, chest tightness, hemoptysis, weight loss, anorexia, orthopnea. Continues on Symbicort bid. Rarely uses albuterol. Takes singulair for trigger prevention.   Allergies  Allergen Reactions   Latex Itching and Rash   Morphine And Related Hives and Itching   Acetaminophen Nausea And Vomiting   Hydrocodone    Morphine    Oxycodone Hcl    Aspirin Nausea And Vomiting    Can take coated asa   Biaxin [Clarithromycin] Nausea And Vomiting   Clarithromycin Diarrhea   Codeine Itching   Darvocet [Propoxyphene N-Acetaminophen] Itching   Hydrocodone-Acetaminophen Itching   Lortab [Hydrocodone-Acetaminophen] Itching   Percocet [Oxycodone-Acetaminophen] Itching   Tramadol Nausea Only    Immunization History  Administered Date(s) Administered   Fluad Quad(high Dose 65+) 04/01/2019   Influenza Split 04/19/2013, 03/18/2014   Influenza, High Dose Seasonal PF 04/25/2018   Influenza,inj,Quad PF,6+ Mos 05/04/2015, 02/27/2017   PFIZER(Purple Top)SARS-COV-2 Vaccination 03/06/2020, 03/25/2020   Pneumococcal Polysaccharide-23 04/19/2013   Zoster, Live 05/30/2013    Past Medical History:  Diagnosis Date   Acute sinusitis, unspecified    Allergy    Anemia    "  quite a few times"   Anxiety state, unspecified    Arachnoiditis BILATERAL LEGS   DUE TO MULTIPLE BACK SURG.'S   Arthritis    Asthma    last flare up was 03/2017 lasted over a month   Blood transfusion    Cancer ([place])    skin cancers (in scalp)    Cardiomyopathy HX --06/2010   EF was 25% during acute illness (PHELONEPHRITIS) Repeat echo 12-06-10 60% showed normal EF.    Chronic back pain greater than 3 months duration    S/P BACK SURG'S   CSF leak    Diabetes mellitus without complication ([place])    dx 2013 type 2   Dyslipidemia    Dysphagia    some post op cerv fusion 2/14   Dysrhythmia    Essential hypertension, benign    Family history of adverse reaction to anesthesia    mother gets n/v   Family history of breast cancer 02/06/2022   Family history of liver cancer 02/06/2022   Family history of lung cancer 02/06/2022   Family history of ovarian cancer 02/06/2022   GERD (gastroesophageal reflux disease) AND HIATIAL HERNIA   CONTROLLED W/ NEXIUM   Headache(784.0)    History of chronic bronchitis    Hx of bladder infections    Hyperlipidemia    TAKE CHLOSTEROL MEDICATION,04/23/19   Hypertension    Neuromuscular disorder (HCC)    numbness and tingling   Osteoporosis    Other malaise and fatigue    PONV (postoperative nausea and vomiting)    Shortness of breath    Spinal headache    Spinal stenosis, cervical region    Varicosities    venous   Weakness of both legs DUE TO ARACHNOIDITIS   OCCASIONAL USES CANE    Tobacco History: Social History   Tobacco Use  Smoking Status Never  Smokeless Tobacco Never   Counseling given: Not Answered   Outpatient Medications Prior to Visit  Medication Sig Dispense Refill   albuterol (PROVENTIL) (2.5 MG/3ML) 0.083% nebulizer solution Inhale into the lungs.     albuterol (VENTOLIN HFA) 108 (90 Base) MCG/ACT inhaler INHALE 2 PUFFS BY MOUTH EVERY 6 HOURS AS NEEDED FOR WHEEZING OR SHORTNESS OF BREATH 8.5 g 4   aspirin EC 81 MG tablet Take 81 mg by mouth at bedtime.      budesonide-formoterol (SYMBICORT) 160-4.5 MCG/ACT inhaler Inhale 2 puffs into the lungs 2 (two) times daily. 1 Inhaler 5   Calcium Carbonate-Vit D-Min 1200-1000 MG-UNIT CHEW Chew 1 tablet by mouth daily.     diltiazem  (CARDIZEM CD) 240 MG 24 hr capsule Take by mouth.     gabapentin (NEURONTIN) 800 MG tablet Take 1 tablet (800 mg total) by mouth 3 (three) times daily. 90 tablet 3   losartan-hydrochlorothiazide (HYZAAR) 50-12.5 MG tablet Take 1 tablet by mouth daily.     metFORMIN (GLUCOPHAGE) 500 MG tablet Take 500 mg by mouth 2 (two) times daily with a meal.      montelukast (SINGULAIR) 10 MG tablet Take 10 mg by mouth at bedtime. Pt takes in the morning.     naloxone (NARCAN) nasal spray 4 mg/0.1 mL Place 1 spray into the nose once as needed (Just in case of Opiod Overdose).     nortriptyline (PAMELOR) 25 MG capsule Take 2 capsules (50 mg total) by mouth at bedtime. 180 capsule 1   omeprazole (PRILOSEC) 40 MG capsule Take 40 mg by mouth daily.     oxyCODONE (ROXICODONE) 15 MG immediate  release tablet Take 1 tablet (15 mg total) by mouth 5 (five) times daily as needed for pain. Do Not Fill Before 08/ 26/2023 150 tablet 0   polyethylene glycol-electrolytes (TRILYTE) 420 g solution Take 4,000 mLs by mouth as directed. 4000 mL 0   promethazine (PHENERGAN) 25 MG tablet Take 25 mg by mouth as needed.      simvastatin (ZOCOR) 10 MG tablet Take by mouth.     topiramate (TOPAMAX) 25 MG tablet Take 1 tablet (25 mg total) by mouth 2 (two) times daily. 180 tablet 1   TRUE METRIX BLOOD GLUCOSE TEST test strip daily.     valACYclovir (VALTREX) 500 MG tablet Take 500 mg by mouth daily.      Facility-Administered Medications Prior to Visit  Medication Dose Route Frequency Provider Last Rate Last Admin   levalbuterol (XOPENEX) nebulizer solution 0.63 mg  0.63 mg Nebulization Once Parrett, Tammy S, NP         Review of Systems:   Constitutional: No weight loss or gain, night sweats, fevers, chills, fatigue, or lassitude. HEENT: No headaches, difficulty swallowing, tooth/dental problems, or sore throat. No sneezing, itching, ear ache, nasal congestion, or post nasal drip CV:  No chest pain, orthopnea, PND, swelling in  lower extremities, anasarca, dizziness, palpitations, syncope Resp: +shortness of breath with strenuous exertion (baseline). No excess mucus or change in color of mucus. No productive or non-productive. No hemoptysis. No wheezing.  No chest wall deformity MSK:  No joint pain or swelling.  No decreased range of motion.  +chronic back pain. Neuro: No dizziness or lightheadedness.  Psych: No depression or anxiety. Mood stable.     Physical Exam:  BP 126/70 (BP Location: Right Arm, Patient Position: Sitting, Cuff Size: Large)   Pulse 90   Temp 97.9 F (36.6 C) (Oral)   Ht '5\' 5"'$  (1.651 m)   Wt 160 lb 12.8 oz (72.9 kg)   SpO2 99%   BMI 26.76 kg/m   GEN: Pleasant, interactive, well-appearing; in no acute distress. HEENT:  Normocephalic and atraumatic. PERRLA. Sclera white. Nasal turbinates pink, moist and patent bilaterally. No rhinorrhea present. Oropharynx pink and moist, without exudate or edema. No lesions, ulcerations, or postnasal drip.  NECK:  Supple w/ fair ROM.  CV: RRR, no m/r/g, no peripheral edema. Pulses intact, +2 bilaterally. No cyanosis, pallor or clubbing. PULMONARY:  Unlabored, regular breathing. Clear bilaterally A&P w/o wheezes/rales/rhonchi. No accessory muscle use. No dullness to percussion. GI: BS present and normoactive. Soft, non-tender to palpation.  MSK: No erythema, warmth or tenderness. Cap refil <2 sec all extrem.  Neuro: A/Ox3. No focal deficits noted.   Skin: Warm, no lesions or rashe Psych: Normal affect and behavior. Judgement and thought content appropriate.     Lab Results:  CBC    Component Value Date/Time   WBC 9.9 08/30/2017 1252   RBC 3.89 08/30/2017 1252   HGB 11.9 (L) 08/30/2017 1252   HCT 36.5 08/30/2017 1252   PLT 289.0 08/30/2017 1252   MCV 93.9 08/30/2017 1252   MCH 32.7 05/17/2017 0636   MCHC 32.5 08/30/2017 1252   RDW 15.0 08/30/2017 1252   LYMPHSABS 2.8 08/30/2017 1252   MONOABS 0.5 08/30/2017 1252   EOSABS 0.2 08/30/2017 1252    BASOSABS 0.1 08/30/2017 1252    BMET    Component Value Date/Time   NA 137 05/17/2017 0636   K 3.6 05/17/2017 0636   CL 102 05/17/2017 0636   CO2 27 05/17/2017 0636   GLUCOSE 113 (  H) 05/17/2017 0636   BUN 13 05/17/2017 0636   CREATININE 0.61 05/17/2017 0636   CALCIUM 8.3 (L) 05/17/2017 0636   GFRNONAA >60 05/17/2017 0636   GFRAA >60 05/17/2017 0636    BNP No results found for: "BNP"   Imaging:  CT Chest Wo Contrast  Result Date: 01/18/2022 CLINICAL DATA:  Multiple pulmonary nodules EXAM: CT CHEST WITHOUT CONTRAST TECHNIQUE: Multidetector CT imaging of the chest was performed following the standard protocol without IV contrast. RADIATION DOSE REDUCTION: This exam was performed according to the departmental dose-optimization program which includes automated exposure control, adjustment of the mA and/or kV according to patient size and/or use of iterative reconstruction technique. COMPARISON:  06/02/2020 FINDINGS: Cardiovascular: Scattered aortic atherosclerosis. Normal heart size. No pericardial effusion. Mediastinum/Nodes: No enlarged mediastinal, hilar, or axillary lymph nodes. Thyroid gland, trachea, and esophagus demonstrate no significant findings. Lungs/Pleura: Numerous very tiny pulmonary nodules, measuring no greater than 0.2 cm, predominantly in the dependent bilateral lung bases and the majority finely calcified (series 5, image 103). Mild, bland appearing bandlike scarring of the lung bases. No pleural effusion or pneumothorax. Upper Abdomen: No acute abnormality. Musculoskeletal: No chest wall abnormality. No suspicious osseous lesions identified. Wedge deformity of L1 status post vertebral cement augmentation IMPRESSION: Numerous very tiny pulmonary nodules, measuring no greater than 0.2 cm, predominantly in the dependent bilateral lung bases and the majority finely calcified. These nodules are benign, and no further specific follow-up or characterization is required. Aortic  Atherosclerosis (ICD10-I70.0). Electronically Signed   By: Delanna Ahmadi M.D.   On: 01/18/2022 15:04          No data to display          Lab Results  Component Value Date   NITRICOXIDE 14 07/18/2018        Assessment & Plan:   Lung nodule Lung nodules are stable, dating back to 2017, and considered benign. She is understandably concerned about these potentially progressing. Her brother had stable nodules for many years, which they stopped monitoring, and now has stage IV NSCLC. She is currently undergoing genetic testing with a geneticist. Awaiting results.   Patient Instructions  Continue Symbicort 2 puffs Twice daily. Brush tongue and rinse mouth afterwards  Continue Albuterol inhaler 2 puffs or 3 mL neb every 6 hours as needed for shortness of breath or wheezing. Notify if symptoms persist despite rescue inhaler/neb use.  Continue singulair 1 tab At bedtime  Continue omeprazole 1 tab daily for reflux   CT scan showed stable nodules from 2021 that are very small, no bigger than 2 mm  Follow up with the geneticist as scheduled.    Follow up in 6 months with Dr. Chase Caller. If symptoms do not improve or worsen, please contact office for sooner follow up or seek emergency care.     Mild persistent asthma, uncomplicated She is compensated on current regimen. No recent exacerbations requiring abx or prednisone. Continue Symbicort Twice daily and prn albuterol. Continue singulair for trigger prevention.   I spent 28 minutes of dedicated to the care of this patient on the date of this encounter to include pre-visit review of records, face-to-face time with the patient discussing conditions above, post visit ordering of testing, clinical documentation with the electronic health record, making appropriate referrals as documented, and communicating necessary findings to members of the patients care team.  Clayton Bibles, NP 02/08/2022  Pt aware and understands NP's role.

## 2022-02-08 NOTE — Patient Instructions (Addendum)
Continue Symbicort 2 puffs Twice daily. Brush tongue and rinse mouth afterwards  Continue Albuterol inhaler 2 puffs or 3 mL neb every 6 hours as needed for shortness of breath or wheezing. Notify if symptoms persist despite rescue inhaler/neb use.  Continue singulair 1 tab At bedtime  Continue omeprazole 1 tab daily for reflux   CT scan showed stable nodules from 2021 that are very small, no bigger than 2 mm  Follow up with the geneticist as scheduled.    Follow up in 6 months with Dr. Chase Caller. If symptoms do not improve or worsen, please contact office for sooner follow up or seek emergency care.

## 2022-02-08 NOTE — Assessment & Plan Note (Addendum)
She is compensated on current regimen. No recent exacerbations requiring abx or prednisone. Continue Symbicort Twice daily and prn albuterol. Continue singulair for trigger prevention.

## 2022-02-08 NOTE — Assessment & Plan Note (Signed)
Lung nodules are stable, dating back to 2017, and considered benign. She is understandably concerned about these potentially progressing. Her brother had stable nodules for many years, which they stopped monitoring, and now has stage IV NSCLC. She is currently undergoing genetic testing with a geneticist. Awaiting results.   Patient Instructions  Continue Symbicort 2 puffs Twice daily. Brush tongue and rinse mouth afterwards  Continue Albuterol inhaler 2 puffs or 3 mL neb every 6 hours as needed for shortness of breath or wheezing. Notify if symptoms persist despite rescue inhaler/neb use.  Continue singulair 1 tab At bedtime  Continue omeprazole 1 tab daily for reflux   CT scan showed stable nodules from 2021 that are very small, no bigger than 2 mm  Follow up with the geneticist as scheduled.    Follow up in 6 months with Dr. Chase Caller. If symptoms do not improve or worsen, please contact office for sooner follow up or seek emergency care.

## 2022-02-09 ENCOUNTER — Encounter (INDEPENDENT_AMBULATORY_CARE_PROVIDER_SITE_OTHER): Payer: Self-pay

## 2022-02-10 ENCOUNTER — Encounter (HOSPITAL_COMMUNITY): Payer: Medicare Other

## 2022-02-13 DIAGNOSIS — Z23 Encounter for immunization: Secondary | ICD-10-CM | POA: Diagnosis not present

## 2022-02-14 ENCOUNTER — Ambulatory Visit (HOSPITAL_COMMUNITY): Admission: RE | Admit: 2022-02-14 | Payer: Medicare Other | Source: Home / Self Care | Admitting: Gastroenterology

## 2022-02-14 ENCOUNTER — Encounter (HOSPITAL_COMMUNITY): Admission: RE | Payer: Self-pay | Source: Home / Self Care

## 2022-02-14 SURGERY — COLONOSCOPY WITH PROPOFOL
Anesthesia: Monitor Anesthesia Care

## 2022-02-17 ENCOUNTER — Ambulatory Visit (HOSPITAL_COMMUNITY)
Admission: RE | Admit: 2022-02-17 | Discharge: 2022-02-17 | Disposition: A | Discharge status: Home / Self Care | Payer: Medicare Other | Source: Ambulatory Visit | Attending: Internal Medicine | Admitting: Internal Medicine

## 2022-02-17 DIAGNOSIS — M542 Cervicalgia: Secondary | ICD-10-CM

## 2022-02-23 ENCOUNTER — Other Ambulatory Visit: Payer: Self-pay | Admitting: *Deleted

## 2022-02-23 DIAGNOSIS — M7989 Other specified soft tissue disorders: Secondary | ICD-10-CM

## 2022-02-23 DIAGNOSIS — I83812 Varicose veins of left lower extremities with pain: Secondary | ICD-10-CM

## 2022-02-28 ENCOUNTER — Encounter: Payer: Self-pay | Admitting: Genetic Counselor

## 2022-02-28 ENCOUNTER — Ambulatory Visit: Payer: Self-pay | Admitting: Genetic Counselor

## 2022-02-28 ENCOUNTER — Telehealth: Payer: Self-pay | Admitting: Genetic Counselor

## 2022-02-28 DIAGNOSIS — Z1379 Encounter for other screening for genetic and chromosomal anomalies: Secondary | ICD-10-CM

## 2022-02-28 DIAGNOSIS — Z800 Family history of malignant neoplasm of digestive organs: Secondary | ICD-10-CM

## 2022-02-28 DIAGNOSIS — Z1211 Encounter for screening for malignant neoplasm of colon: Secondary | ICD-10-CM | POA: Insufficient documentation

## 2022-02-28 DIAGNOSIS — Z8041 Family history of malignant neoplasm of ovary: Secondary | ICD-10-CM

## 2022-02-28 DIAGNOSIS — Z801 Family history of malignant neoplasm of trachea, bronchus and lung: Secondary | ICD-10-CM

## 2022-02-28 DIAGNOSIS — Z803 Family history of malignant neoplasm of breast: Secondary | ICD-10-CM

## 2022-02-28 NOTE — Telephone Encounter (Signed)
Contacted patient in attempt to disclose results of genetic testing.  LVM with contact information requesting a call back.  

## 2022-03-01 ENCOUNTER — Ambulatory Visit: Payer: Medicare Other | Admitting: Vascular Surgery

## 2022-03-01 ENCOUNTER — Encounter (HOSPITAL_COMMUNITY): Payer: Medicare Other

## 2022-03-02 NOTE — Telephone Encounter (Signed)
Returned call to discuss results.  No answer and LVM.  Released results through [facility] portal.  Requesting call back to discuss results.

## 2022-03-03 NOTE — Telephone Encounter (Signed)
Patient LVM asking questions about results. LVM answering questions and explaining results of testing.  Requested patient call back to discuss further.

## 2022-03-06 NOTE — Telephone Encounter (Signed)
Revealed negative genetics.   

## 2022-03-08 NOTE — Progress Notes (Signed)
HPI:   Ms. Napoles was previously seen in the [facility] clinic due to a family history of cancer and concerns regarding a hereditary predisposition to cancer. Please refer to our prior cancer genetics clinic note for more information regarding our discussion, assessment and recommendations, at the time. Ms. Watterson recent genetic test results were disclosed to her, as were recommendations warranted by these results. These results and recommendations are discussed in more detail below.  CANCER HISTORY:  Ms. Altizer is a 71 y.o. female with a personal history of skin cancer (non-melanoma) on her head and hand, diagnosed after 27.  She has a history of pulmonary nodules.   FAMILY HISTORY:  We obtained a detailed, 4-generation family history.  Significant diagnoses are listed below:      Family History  Problem Relation Age of Onset   Liver cancer Father 32        Olivia Lopez de Gutierrez   Lung cancer Brother 30        mets   Melanoma Brother 52        neck and back   Cancer Paternal Uncle          unknown type; dx after 29   Breast cancer Paternal Grandmother          dx 26s   Ovarian cancer Cousin          paternal cousin; d. 87s   Cancer [place]; d. 77s       Ms. Day is unaware of previous family history of genetic testing for hereditary cancer risks.  There is no reported Ashkenazi Jewish ancestry. There is no known consanguinity.  GENETIC TEST RESULTS:  The [facility] CancerNext-Expanded +RNAinsight Panel found no pathogenic mutations.   The CancerNext-Expanded gene panel offered by [facility] and includes sequencing, rearrangement, and RNA analysis for the following 77 genes: AIP, ALK, APC, ATM, AXIN2, BAP1, BARD1, BLM, BMPR1A, BRCA1, BRCA2, BRIP1, CDC73, CDH1, CDK4, CDKN1B, CDKN2A, CHEK2, CTNNA1, DICER1, FANCC, FH, FLCN, GALNT12, KIF1B, LZTR1, MAX, MEN1, MET, MLH1, MSH2, MSH3, MSH6, MUTYH, NBN, NF1, NF2, NTHL1, PALB2, PHOX2B, PMS2, POT1, PRKAR1A, PTCH1, PTEN, RAD51C, RAD51D,  RB1, RECQL, RET, SDHA, SDHAF2, SDHB, SDHC, SDHD, SMAD4, SMARCA4, SMARCB1, SMARCE1, STK11, SUFU, TMEM127, TP53, TSC1, TSC2, VHL and XRCC2 (sequencing and deletion/duplication); EGFR, EGLN1, HOXB13, KIT, MITF, PDGFRA, POLD1, and POLE (sequencing only); EPCAM and GREM1 (deletion/duplication only).    The test report has been scanned into EPIC and is located under the Molecular Pathology section of the Results Review tab.  A portion of the result report is included below for reference. Genetic testing reported out on February 21, 2022.      Even though a pathogenic variant was not identified, possible explanations for the cancer in the family may include: There may be no hereditary risk for cancer in the family. The cancers in Ms. Leverette's family may be sporadic/familial or due to other genetic and environmental factors. There may be a gene mutation in one of these genes that current testing methods cannot detect but that chance is small. There could be another gene that has not yet been discovered, or that we have not yet tested, that is responsible for the cancer diagnoses in the family.  It is also possible there is a hereditary cause for the cancer in the family that Ms. Berkey did not inherit.  Therefore, it is important to remain in touch with cancer genetics in the future so that we  can continue to offer Ms. Stach the most up to date genetic testing.    ADDITIONAL GENETIC TESTING:  We discussed with Ms. Dike that her genetic testing was fairly extensive.  If there are additional relevant genes identified to increase cancer risk that can be analyzed in the future, we would be happy to discuss and coordinate this testing at that time.     CANCER SCREENING RECOMMENDATIONS:  Ms. Guymon's test result is considered negative (normal).  This means that we have not identified a hereditary cause for her family history of cancer at this time.   An individual's cancer risk and medical management are not  determined by genetic test results alone. Overall cancer risk assessment incorporates additional factors, including personal medical history, family history, and any available genetic information that may result in a personalized plan for cancer prevention and surveillance. Therefore, it is recommended she continue to follow the cancer management and screening guidelines provided by her primary healthcare provider.  RECOMMENDATIONS FOR FAMILY MEMBERS:   Since she did not inherit a identifiable mutation in a cancer predisposition gene included on this panel, her son could not have inherited a known mutation from her in one of these genes. Individuals in this family might be at some increased risk of developing cancer, over the general population risk, due to the family history of cancer.   Other members of the family may still carry a pathogenic variant in one of these genes that Ms. Weng did not inherit. Based on the family history, we recommend relatives more closely related to her paternal cousin with ovarian cancer have genetic counseling and testing. Ms. Grays can let us know if we can be of any assistance in coordinating genetic counseling and/or testing for this family member.     FOLLOW-UP:  Lastly, we discussed with Ms. Dede that cancer genetics is a rapidly advancing field and it is possible that new genetic tests will be appropriate for her and/or her family members in the future. We encouraged her to remain in contact with cancer genetics on an annual basis so we can update her personal and family histories and let her know of advances in cancer genetics that may benefit this family.   Our contact number was provided. Ms. Doan questions were answered to her satisfaction, and she knows she is welcome to call us at anytime with additional questions or concerns.   Lillyann Ahart M. Joette Catching, [place], [facility] Genetic Counselor [email or web address] (P) [phone]

## 2022-03-13 ENCOUNTER — Encounter: Payer: Medicare Other | Attending: Physical Medicine & Rehabilitation | Admitting: Registered Nurse

## 2022-03-13 ENCOUNTER — Encounter: Payer: Self-pay | Admitting: Registered Nurse

## 2022-03-13 VITALS — BP 117/79 | HR 86 | Ht <= 58 in

## 2022-03-13 DIAGNOSIS — M546 Pain in thoracic spine: Secondary | ICD-10-CM | POA: Diagnosis not present

## 2022-03-13 DIAGNOSIS — Z5181 Encounter for therapeutic drug level monitoring: Secondary | ICD-10-CM

## 2022-03-13 DIAGNOSIS — G8929 Other chronic pain: Secondary | ICD-10-CM

## 2022-03-13 DIAGNOSIS — Z79891 Long term (current) use of opiate analgesic: Secondary | ICD-10-CM

## 2022-03-13 DIAGNOSIS — M542 Cervicalgia: Secondary | ICD-10-CM

## 2022-03-13 DIAGNOSIS — R519 Headache, unspecified: Secondary | ICD-10-CM

## 2022-03-13 DIAGNOSIS — G039 Meningitis, unspecified: Secondary | ICD-10-CM

## 2022-03-13 DIAGNOSIS — M961 Postlaminectomy syndrome, not elsewhere classified: Secondary | ICD-10-CM

## 2022-03-13 DIAGNOSIS — M5416 Radiculopathy, lumbar region: Secondary | ICD-10-CM

## 2022-03-13 DIAGNOSIS — G894 Chronic pain syndrome: Secondary | ICD-10-CM

## 2022-03-13 DIAGNOSIS — M5412 Radiculopathy, cervical region: Secondary | ICD-10-CM

## 2022-03-13 MED ORDER — TOPIRAMATE 25 MG PO TABS: 25.0000 mg | ORAL_TABLET | Freq: Two times a day (BID) | ORAL | 1 refills | Status: DC

## 2022-03-13 MED ORDER — OXYCODONE HCL 15 MG PO TABS: 15.0000 mg | ORAL_TABLET | Freq: Every day | ORAL | 0 refills | Status: DC | PRN

## 2022-03-13 NOTE — Progress Notes (Unsigned)
Subjective:    Patient ID: [name] [name], female    DOB: 1951/02/23, 71 y.o.   MRN: [id number]  HPI: [name] [name] is a 71 y.o. female who returns for follow up appointment for chronic pain and medication refill. states *** pain is located in  ***. rates pain ***. current exercise regime is walking and performing stretching exercises.  [name] [name] Morphine equivalent is *** MME.   UDS ordered Today.     Pain Inventory Average Pain 7 Pain Right Now 7 My pain is sharp, burning, stabbing, tingling, and aching  In the last 24 hours, has pain interfered with the following? General activity 7 Relation with others 7 Enjoyment of life 7 What TIME of day is your pain at its worst? morning , daytime, evening, and night Sleep (in general) Fair  Pain is worse with: walking, bending, sitting, inactivity, standing, and some activites Pain improves with: rest, heat/ice, pacing activities, and medication Relief from Meds: 7  Family History  Problem Relation Age of Onset   Hypertension Mother    Heart disease Mother        Mitral valve replacement.    Diabetes Mother    Liver cancer Father 40       [place]   Diabetes Brother    Lung cancer Brother 64       mets   Melanoma Brother 73       neck and back   Cancer Paternal Uncle        unknown type; dx after 34   Breast cancer Paternal Grandmother        dx 60s   Ovarian cancer Cousin        paternal cousin; d. 83s   Cancer Cousin        GYN; d. 61s   Colon cancer Neg Hx    Colon polyps Neg Hx    Esophageal cancer Neg Hx    Rectal cancer Neg Hx    Stomach cancer Neg Hx    Social History   Socioeconomic History   Marital status: Divorced    Spouse name: Not on file   Number of children: Not on file   Years of education: Not on file   Highest education level: Not on file  Occupational History   Occupation: disable    Employer: DISABLED  Tobacco Use   Smoking status: Never   Smokeless tobacco: Never  Vaping Use   Vaping Use:  Never used  Substance and Sexual Activity   Alcohol use: No   Drug use: No   Sexual activity: Not on file    Comment: second hand smoke  Other Topics Concern   Not on file  Social History Narrative   Has one child lives at home with her.    Social Determinants of Health   Financial Resource Strain: Not on file  Food Insecurity: Not on file  Transportation Needs: Not on file  Physical Activity: Not on file  Stress: Not on file  Social Connections: Not on file   Past Surgical History:  Procedure Laterality Date   ABDOMINAL HYSTERECTOMY  1987   W/ [place] DECOMP/DISCECTOMY FUSION N/A 08/09/2012   Procedure: ANTERIOR CERVICAL DECOMPRESSION/DISCECTOMY FUSION 1 LEVEL;  Surgeon: Floyce Stakes, MD;  Location: [facility];  Service: Neurosurgery;  Laterality: N/A;  Cervical four-five  Anterior cervical decompression/diskectomy/fusion   ANTERIOR FUSION CERVICAL SPINE  2007   C5 -6   APPENDECTOMY  APPLICATION OF INTRAOPERATIVE CT SCAN N/A 05/17/2017   Procedure: APPLICATION OF INTRAOPERATIVE CT SCAN;  Surgeon: Erline Levine, MD;  Location: [place];  Service: Neurosurgery;  Laterality: N/A;   BACK SURGERY     x 5   BREAST EXCISIONAL BIOPSY Bilateral    No scar seen    BREAST SURGERY     x 2 biopsies   CARPAL TUNNEL RELEASE  RIGHT - 2001  & DEC 2011 W/ BACK SURG.   CERVICAL DISC SURGERY  2005   C5 - 6   CHOLECYSTECTOMY  1994   DORSAL COMPARTMENT RELEASE Right 04/07/2013   Procedure: RIGHT WRIST STS RELEASE;  Surgeon: Schuyler Amor, MD;  Location: [place];  Service: Orthopedics;  Laterality: Right;   ENDOVENOUS ABLATION SAPHENOUS VEIN W/ LASER Right 05/29/2019   endovenous laser ablation right greater saphenous vein by Gae Gallop MD    EXPLORATION OF INCISION FOR CSF LEAK  DEC 2011  X2   POST [place] Right 04/07/2013   Procedure: RIGHT INDEX AND RIGHT LONG DISTAL INTERPHALANGEAL JOINT FUSIONS;  Surgeon: Schuyler Amor, MD;  Location: [place];  Service: Orthopedics;  Laterality: Right;   FINGER ARTHROPLASTY Right 04/07/2013   Procedure: RIGHT THUMB [place] ARTHROPLASTY;  Surgeon: Schuyler Amor, MD;  Location: [facility];  Service: Orthopedics;  Laterality: Right;   GANGLION CYST EXCISION Right 04/07/2013   Procedure: RIGHT WRIST MASS EXCISION;  Surgeon: Schuyler Amor, MD;  Location: [place];  Service: Orthopedics;  Laterality: Right;   KNEE ARTHROSCOPY  LEFT X3 (LAST ONE 2005)   KNEE ARTHROSCOPY  05/17/2011   Procedure: ARTHROSCOPY KNEE;  Surgeon: Bradley Ferris III;  Location: [place];  Service: Orthopedics;  Laterality: Right;  WITH MEDIAL MENISECTOMY AND removal of suprapatella fat lump   LEFT WRIST TENOSYNECTOMY W/ LEFT THUMB JOINT REPAIR  02-24-10   LUMBAR FUSION  11-30-10   L4 - 5   LUMBAR FUSION  2000   L2 - 4   LUMBAR LAMINECTOMY  DEC 2011   L4 - 5   POSTERIOR CERVICAL FUSION/FORAMINOTOMY N/A 05/17/2017   Procedure: Cervical five  to Thorastic one  Posterior cervical fusion with lateral mass screws/revision of prior instrumentation;  Surgeon: Erline Levine, MD;  Location: [place];  Service: Neurosurgery;  Laterality: N/A;  C5 to T1 Posterior cervical fusion with lateral mass screws/revision of prior instrumentation   TENDON REPAIR  JAN 2010   LEFT INDEX AND LONG FINGERS   THUMB SURGERY   04/07/2019   RIGHT THUMB   TUBAL LIGATION     Past Surgical History:  Procedure Laterality Date   ABDOMINAL HYSTERECTOMY  1987   W/ [facility]   ANTERIOR CERVICAL DECOMP/DISCECTOMY FUSION N/A 08/09/2012   Procedure: ANTERIOR CERVICAL DECOMPRESSION/DISCECTOMY FUSION 1 LEVEL;  Surgeon: Floyce Stakes, MD;  Location: [facility];  Service: Neurosurgery;  Laterality: N/A;  Cervical four-five  Anterior cervical decompression/diskectomy/fusion   ANTERIOR FUSION CERVICAL SPINE  2007   C5 -6   APPENDECTOMY     APPLICATION OF INTRAOPERATIVE  CT SCAN N/A 05/17/2017   Procedure: APPLICATION OF INTRAOPERATIVE CT SCAN;  Surgeon: Erline Levine, MD;  Location: [place];  Service: Neurosurgery;  Laterality: N/A;   BACK SURGERY     x 5   BREAST EXCISIONAL BIOPSY Bilateral    No scar seen    BREAST SURGERY     x 2 biopsies   CARPAL TUNNEL  RELEASE  RIGHT - 2001  & DEC 2011 W/ BACK SURG.   CERVICAL DISC SURGERY  2005   C5 - 6   CHOLECYSTECTOMY  1994   DORSAL COMPARTMENT RELEASE Right 04/07/2013   Procedure: RIGHT WRIST STS RELEASE;  Surgeon: Schuyler Amor, MD;  Location: [place];  Service: Orthopedics;  Laterality: Right;   ENDOVENOUS ABLATION SAPHENOUS VEIN W/ LASER Right 05/29/2019   endovenous laser ablation right greater saphenous vein by Gae Gallop MD    EXPLORATION OF INCISION FOR CSF LEAK  DEC 2011  X2   POST [place] Right 04/07/2013   Procedure: RIGHT INDEX AND RIGHT LONG DISTAL INTERPHALANGEAL JOINT FUSIONS;  Surgeon: Schuyler Amor, MD;  Location: [place];  Service: Orthopedics;  Laterality: Right;   FINGER ARTHROPLASTY Right 04/07/2013   Procedure: RIGHT THUMB [place] ARTHROPLASTY;  Surgeon: Schuyler Amor, MD;  Location: [place];  Service: Orthopedics;  Laterality: Right;   GANGLION CYST EXCISION Right 04/07/2013   Procedure: RIGHT WRIST MASS EXCISION;  Surgeon: Schuyler Amor, MD;  Location: [place];  Service: Orthopedics;  Laterality: Right;   KNEE ARTHROSCOPY  LEFT X3 (LAST ONE 2005)   KNEE ARTHROSCOPY  05/17/2011   Procedure: ARTHROSCOPY KNEE;  Surgeon: Bradley Ferris III;  Location: [place];  Service: Orthopedics;  Laterality: Right;  WITH MEDIAL MENISECTOMY AND removal of suprapatella fat lump   LEFT WRIST TENOSYNECTOMY W/ LEFT THUMB JOINT REPAIR  02-24-10   LUMBAR FUSION  11-30-10   L4 - 5   LUMBAR FUSION  2000   L2 - 4   LUMBAR LAMINECTOMY  DEC 2011   L4 - 5   POSTERIOR CERVICAL  FUSION/FORAMINOTOMY N/A 05/17/2017   Procedure: Cervical five  to Thorastic one  Posterior cervical fusion with lateral mass screws/revision of prior instrumentation;  Surgeon: Erline Levine, MD;  Location: [place];  Service: Neurosurgery;  Laterality: N/A;  C5 to T1 Posterior cervical fusion with lateral mass screws/revision of prior instrumentation   TENDON REPAIR  JAN 2010   LEFT INDEX AND LONG FINGERS   THUMB SURGERY   04/07/2019   RIGHT THUMB   TUBAL LIGATION     Past Medical History:  Diagnosis Date   Acute sinusitis, unspecified    Allergy    Anemia    "quite a few times"   Anxiety state, unspecified    Arachnoiditis BILATERAL LEGS   DUE TO MULTIPLE BACK SURG.'S   Arthritis    Asthma    last flare up was 03/2017 lasted over a month   Blood transfusion    Cancer ([place])    skin cancers (in scalp)   Cardiomyopathy HX --06/2010   EF was 25% during acute illness (PHELONEPHRITIS) Repeat echo 12-06-10 60% showed normal EF.    Chronic back pain greater than 3 months duration    S/P BACK SURG'S   CSF leak    Diabetes mellitus without complication ()    dx 2013 type 2   Dyslipidemia    Dysphagia    some post op cerv fusion 2/14   Dysrhythmia    Essential hypertension, benign    Family history of adverse reaction to anesthesia    mother gets n/v   Family history of breast cancer 02/06/2022   Family history of liver cancer 02/06/2022   Family history of lung cancer 02/06/2022   Family history of ovarian cancer 02/06/2022   GERD (gastroesophageal reflux  disease) AND HIATIAL HERNIA   CONTROLLED W/ NEXIUM   Headache(784.0)    History of chronic bronchitis    Hx of bladder infections    Hyperlipidemia    TAKE CHLOSTEROL MEDICATION,04/23/19   Hypertension    Neuromuscular disorder (HCC)    numbness and tingling   Osteoporosis    Other malaise and fatigue    PONV (postoperative nausea and vomiting)    Shortness of breath    Spinal headache    Spinal stenosis, cervical region     Varicosities    venous   Weakness of both legs DUE TO ARACHNOIDITIS   OCCASIONAL USES CANE   BP 117/79   Pulse 86   Ht 1' (0.305 m)   SpO2 94%   BMI 785.10 kg/m   Opioid Risk Score:   Fall Risk Score:  `1  Depression screen PHQ 2/9     03/13/2022    1:14 PM 12/29/2021   12:59 PM 11/16/2021    2:15 PM 09/13/2021    1:39 PM 08/16/2021    1:04 PM 04/19/2021    1:34 PM 02/18/2021    1:27 PM  Depression screen PHQ 2/9  Decreased Interest 0 0 0 0 0 1 0  Down, Depressed, Hopeless 0 0 0 0 0 1 0  PHQ - 2 Score 0 0 0 0 0 2 0     Review of Systems  Constitutional: Negative.   HENT: Negative.    Eyes: Negative.   Respiratory: Negative.    Cardiovascular: Negative.   Gastrointestinal: Negative.   Endocrine: Negative.   Genitourinary: Negative.   Musculoskeletal:  Positive for back pain.  Skin: Negative.   Allergic/Immunologic: Negative.   Neurological: Negative.   Hematological: Negative.   Psychiatric/Behavioral: Negative.    All other systems reviewed and are negative.      Objective:   Physical Exam        Assessment & Plan:   1. On 05/17/2017 :C5C6C7 T1 Posterior Cervical Fusion with lateral mass fixation with [facility] [facility], revision of prior instrumentation. By Dr. Vertell Limber.  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 Continue current medication regimen with  Gabapentin 800 mg BID. [place]: Oxycodone '15mg'$  one tablet 5 times a day  as needed for pain # 150 tablets 12/29/2021. We will continue the opioid monitoring program, this consists of regular clinic visits, examinations, urine drug screen, pill counts as well as use of [place] Controlled Substance Reporting system. 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 Continue to Monitor. 12/29/2021 3.  Anxiety/depression: PCP Following. Continue to monitor. 12/29/2021. 4. Muscle Spasms: Continue current medication regime with Flexeril. Continue to Monitor. 12/17/2021 5. Cervicalgia/ Cervical Radiculitis: Dr Vertell Limber Following. ,Continue current medication regime with Gabapentin:  S/P  C5C6C7 T1 Posterior Cervical Fusion with lateral mass fixation with AIRO Imaging, revision of prior instrumentation by Dr. Vertell Limber on 05/17/2017. 12/29/2021 7. Bilateral Thoracic Back Pain: Continue HEP as Tolerated and Continue current medication regimen. Continue to Monitor. 12/29/2021  8. Left lower extremity DVT/ Phlebitis:  S/P endovenous laser ablation of left great saphenous vein on 05/09/2018 by Dr. Scot Dock:  Vascular  Following. 12/29/2021. 9. Insomnia: Continue Pamelor. Continue to Monitor. 12/29/2021. 10. Left Knee Pain: No complaints today. Ortho Following. 12/17/2021     F/U in 1 month

## 2022-03-16 ENCOUNTER — Other Ambulatory Visit (INDEPENDENT_AMBULATORY_CARE_PROVIDER_SITE_OTHER): Payer: Self-pay

## 2022-03-16 ENCOUNTER — Encounter (INDEPENDENT_AMBULATORY_CARE_PROVIDER_SITE_OTHER): Payer: Self-pay

## 2022-03-16 DIAGNOSIS — R5383 Other fatigue: Secondary | ICD-10-CM | POA: Diagnosis not present

## 2022-03-16 DIAGNOSIS — E1143 Type 2 diabetes mellitus with diabetic autonomic (poly)neuropathy: Secondary | ICD-10-CM | POA: Diagnosis not present

## 2022-03-16 DIAGNOSIS — I10 Essential (primary) hypertension: Secondary | ICD-10-CM

## 2022-03-16 DIAGNOSIS — Z1211 Encounter for screening for malignant neoplasm of colon: Secondary | ICD-10-CM

## 2022-03-16 DIAGNOSIS — Z79899 Other long term (current) drug therapy: Secondary | ICD-10-CM | POA: Diagnosis not present

## 2022-03-16 DIAGNOSIS — L570 Actinic keratosis: Secondary | ICD-10-CM | POA: Diagnosis not present

## 2022-03-16 DIAGNOSIS — E7849 Other hyperlipidemia: Secondary | ICD-10-CM | POA: Diagnosis not present

## 2022-03-16 LAB — TOXASSURE SELECT,+ANTIDEPR,UR

## 2022-03-22 ENCOUNTER — Telehealth: Payer: Self-pay | Admitting: *Deleted

## 2022-03-22 ENCOUNTER — Encounter (HOSPITAL_COMMUNITY): Payer: Medicare Other

## 2022-03-22 ENCOUNTER — Ambulatory Visit: Payer: Medicare Other | Admitting: Vascular Surgery

## 2022-03-22 NOTE — Telephone Encounter (Signed)
Urine drug screen for this encounter is consistent for prescribed medication 

## 2022-04-11 ENCOUNTER — Encounter: Payer: Medicare Other | Admitting: Registered Nurse

## 2022-04-13 ENCOUNTER — Encounter: Payer: Self-pay | Admitting: Registered Nurse

## 2022-04-13 ENCOUNTER — Encounter: Payer: Medicare Other | Attending: Physical Medicine & Rehabilitation | Admitting: Registered Nurse

## 2022-04-13 VITALS — BP 104/70 | Ht 65.0 in | Wt 157.0 lb

## 2022-04-13 DIAGNOSIS — Z5181 Encounter for therapeutic drug level monitoring: Secondary | ICD-10-CM

## 2022-04-13 DIAGNOSIS — M5412 Radiculopathy, cervical region: Secondary | ICD-10-CM | POA: Diagnosis not present

## 2022-04-13 DIAGNOSIS — W19XXXD Unspecified fall, subsequent encounter: Secondary | ICD-10-CM | POA: Insufficient documentation

## 2022-04-13 DIAGNOSIS — Z79891 Long term (current) use of opiate analgesic: Secondary | ICD-10-CM

## 2022-04-13 DIAGNOSIS — M5416 Radiculopathy, lumbar region: Secondary | ICD-10-CM

## 2022-04-13 DIAGNOSIS — G039 Meningitis, unspecified: Secondary | ICD-10-CM | POA: Diagnosis not present

## 2022-04-13 DIAGNOSIS — Y92009 Unspecified place in unspecified non-institutional (private) residence as the place of occurrence of the external cause: Secondary | ICD-10-CM | POA: Diagnosis not present

## 2022-04-13 DIAGNOSIS — M961 Postlaminectomy syndrome, not elsewhere classified: Secondary | ICD-10-CM

## 2022-04-13 DIAGNOSIS — M546 Pain in thoracic spine: Secondary | ICD-10-CM | POA: Insufficient documentation

## 2022-04-13 DIAGNOSIS — G894 Chronic pain syndrome: Secondary | ICD-10-CM | POA: Diagnosis not present

## 2022-04-13 DIAGNOSIS — G8929 Other chronic pain: Secondary | ICD-10-CM | POA: Diagnosis not present

## 2022-04-13 DIAGNOSIS — M542 Cervicalgia: Secondary | ICD-10-CM | POA: Diagnosis not present

## 2022-04-13 DIAGNOSIS — R519 Headache, unspecified: Secondary | ICD-10-CM | POA: Diagnosis not present

## 2022-04-13 MED ORDER — OXYCODONE HCL 15 MG PO TABS: 15.0000 mg | ORAL_TABLET | Freq: Every day | ORAL | 0 refills | Status: DC | PRN

## 2022-04-13 MED ORDER — NORTRIPTYLINE HCL 25 MG PO CAPS: 25.0000 mg | ORAL_CAPSULE | Freq: Every day | ORAL | 1 refills | Status: DC

## 2022-04-13 MED ORDER — TOPIRAMATE 25 MG PO TABS: 25.0000 mg | ORAL_TABLET | Freq: Two times a day (BID) | ORAL | 3 refills | Status: DC

## 2022-04-13 MED ORDER — GABAPENTIN 800 MG PO TABS: 800.0000 mg | ORAL_TABLET | Freq: Three times a day (TID) | ORAL | 3 refills | Status: DC

## 2022-04-13 MED ORDER — GABAPENTIN 800 MG PO TABS: 800.0000 mg | ORAL_TABLET | Freq: Two times a day (BID) | ORAL | 3 refills | Status: DC

## 2022-04-13 NOTE — Progress Notes (Signed)
Subjective:    Patient ID: [name] [name], female    DOB: April 26, 1951, 71 y.o.   MRN: [id number]  HPI: [name] [name] is a 71 y.o. female who returns for follow up appointment for chronic pain and medication refill. She states her pain is located in her neck radiating into her bilateral shoulders and mid- lower back pain radiating into her bilateral lower extremities. She rates her pain 8. Her current exercise regime is walking and performing stretching exercises.  [name] [name] reports on March 28, 2022 she was walking up the steps of her deck, and fell backwards and landed on her back. She was able to pick herself up, she didn't seek medical attention. Educated on falls prevention, she verbalizes understanding.   [name] [name] Morphine equivalent is 112.50 MME.   Last UDS was Performed on 03/13/2022, it was consistent.      Pain Inventory Average Pain 7 Pain Right Now 8 My pain is sharp, burning, stabbing, tingling, and aching  In the last 24 hours, has pain interfered with the following? General activity 7 Relation with others 7 Enjoyment of life 7 What TIME of day is your pain at its worst? morning , daytime, evening, and night Sleep (in general) Fair  Pain is worse with: walking, bending, sitting, inactivity, standing, and some activites Pain improves with: rest, pacing activities, medication, and heat Relief from Meds: 7  Family History  Problem Relation Age of Onset   Hypertension Mother    Heart disease Mother        Mitral valve replacement.    Diabetes Mother    Liver cancer Father 46       [place]   Diabetes Brother    Lung cancer Brother 4       mets   Melanoma Brother 43       neck and back   Cancer Paternal Uncle        unknown type; dx after 26   Breast cancer Paternal Grandmother        dx 75s   Ovarian cancer Cousin        paternal cousin; d. 25s   Cancer Cousin        GYN; d. 35s   Colon cancer Neg Hx    Colon polyps Neg Hx    Esophageal cancer Neg Hx     Rectal cancer Neg Hx    Stomach cancer Neg Hx    Social History   Socioeconomic History   Marital status: Divorced    Spouse name: Not on file   Number of children: Not on file   Years of education: Not on file   Highest education level: Not on file  Occupational History   Occupation: disable    Employer: DISABLED  Tobacco Use   Smoking status: Never   Smokeless tobacco: Never  Vaping Use   Vaping Use: Never used  Substance and Sexual Activity   Alcohol use: No   Drug use: No   Sexual activity: Not on file    Comment: second hand smoke  Other Topics Concern   Not on file  Social History Narrative   Has one child lives at home with her.    Social Determinants of Health   Financial Resource Strain: Not on file  Food Insecurity: Not on file  Transportation Needs: Not on file  Physical Activity: Not on file  Stress: Not on file  Social Connections: Not on file   Past Surgical History:  Procedure  Laterality Date   ABDOMINAL HYSTERECTOMY  1987   W/ BSO   ANTERIOR CERVICAL DECOMP/DISCECTOMY FUSION N/A 08/09/2012   Procedure: ANTERIOR CERVICAL DECOMPRESSION/DISCECTOMY FUSION 1 LEVEL;  Surgeon: [name] Stakes, MD;  Location: [facility];  Service: Neurosurgery;  Laterality: N/A;  Cervical four-five  Anterior cervical decompression/diskectomy/fusion   ANTERIOR FUSION CERVICAL SPINE  2007   C5 -6   APPENDECTOMY     APPLICATION OF INTRAOPERATIVE CT SCAN N/A 05/17/2017   Procedure: APPLICATION OF INTRAOPERATIVE CT SCAN;  Surgeon: [name] Levine, MD;  Location: [place];  Service: Neurosurgery;  Laterality: N/A;   BACK SURGERY     x 5   BREAST EXCISIONAL BIOPSY Bilateral    No scar seen    BREAST SURGERY     x 2 biopsies   CARPAL TUNNEL RELEASE  RIGHT - 2001  & DEC 2011 W/ BACK SURG.   CERVICAL DISC SURGERY  2005   C5 - 6   CHOLECYSTECTOMY  1994   DORSAL COMPARTMENT RELEASE Right 04/07/2013   Procedure: RIGHT WRIST STS RELEASE;  Surgeon: [name] Amor, MD;  Location:  [place];  Service: Orthopedics;  Laterality: Right;   ENDOVENOUS ABLATION SAPHENOUS VEIN W/ LASER Right 05/29/2019   endovenous laser ablation right greater saphenous vein by [name] Gallop MD    EXPLORATION OF INCISION FOR CSF LEAK  DEC 2011  X2   POST [place] Right 04/07/2013   Procedure: RIGHT INDEX AND RIGHT LONG DISTAL INTERPHALANGEAL JOINT FUSIONS;  Surgeon: [name] Amor, MD;  Location: [place];  Service: Orthopedics;  Laterality: Right;   FINGER ARTHROPLASTY Right 04/07/2013   Procedure: RIGHT THUMB [place] ARTHROPLASTY;  Surgeon: [name] Amor, MD;  Location: [place];  Service: Orthopedics;  Laterality: Right;   GANGLION CYST EXCISION Right 04/07/2013   Procedure: RIGHT WRIST MASS EXCISION;  Surgeon: [name] Amor, MD;  Location: [place];  Service: Orthopedics;  Laterality: Right;   KNEE ARTHROSCOPY  LEFT X3 (LAST ONE 2005)   KNEE ARTHROSCOPY  05/17/2011   Procedure: ARTHROSCOPY KNEE;  Surgeon: [name] [name];  Location: [place];  Service: Orthopedics;  Laterality: Right;  WITH MEDIAL MENISECTOMY AND removal of suprapatella fat lump   LEFT WRIST TENOSYNECTOMY W/ LEFT THUMB JOINT REPAIR  02-24-10   LUMBAR FUSION  11-30-10   L4 - 5   LUMBAR FUSION  2000   L2 - 4   LUMBAR LAMINECTOMY  DEC 2011   L4 - 5   POSTERIOR CERVICAL FUSION/FORAMINOTOMY N/A 05/17/2017   Procedure: Cervical five  to Thorastic one  Posterior cervical fusion with lateral mass screws/revision of prior instrumentation;  Surgeon: [name] Levine, MD;  Location: [place];  Service: Neurosurgery;  Laterality: N/A;  C5 to T1 Posterior cervical fusion with lateral mass screws/revision of prior instrumentation   TENDON REPAIR  JAN 2010   LEFT INDEX AND LONG FINGERS   THUMB SURGERY   04/07/2019   RIGHT THUMB   TUBAL LIGATION     Past Surgical History:  Procedure Laterality Date   ABDOMINAL  HYSTERECTOMY  1987   W/ BSO   ANTERIOR CERVICAL DECOMP/DISCECTOMY FUSION N/A 08/09/2012   Procedure: ANTERIOR CERVICAL DECOMPRESSION/DISCECTOMY FUSION 1 LEVEL;  Surgeon: [name] Stakes, MD;  [facility];  Service: Neurosurgery;  Laterality: N/A;  Cervical four-five  Anterior cervical decompression/diskectomy/fusion   ANTERIOR FUSION CERVICAL SPINE  2007   C5 -6   APPENDECTOMY  APPLICATION OF INTRAOPERATIVE CT SCAN N/A 05/17/2017   Procedure: APPLICATION OF INTRAOPERATIVE CT SCAN;  Surgeon: [name] Levine, MD;  Location: [place];  Service: Neurosurgery;  Laterality: N/A;   BACK SURGERY     x 5   BREAST EXCISIONAL BIOPSY Bilateral    No scar seen    BREAST SURGERY     x 2 biopsies   CARPAL TUNNEL RELEASE  RIGHT - 2001  & DEC 2011 W/ BACK SURG.   CERVICAL DISC SURGERY  2005   C5 - 6   CHOLECYSTECTOMY  1994   DORSAL COMPARTMENT RELEASE Right 04/07/2013   Procedure: RIGHT WRIST STS RELEASE;  Surgeon: [name] Amor, MD;  Location: [facility] [place];  Service: Orthopedics;  Laterality: Right;   ENDOVENOUS ABLATION SAPHENOUS VEIN W/ LASER Right 05/29/2019   endovenous laser ablation right greater saphenous vein by [name] Gallop MD    EXPLORATION OF INCISION FOR CSF LEAK  DEC 2011  X2   POST [place] Right 04/07/2013   Procedure: RIGHT INDEX AND RIGHT LONG DISTAL INTERPHALANGEAL JOINT FUSIONS;  Surgeon: [name] Amor, MD;  Location: [place];  Service: Orthopedics;  Laterality: Right;   FINGER ARTHROPLASTY Right 04/07/2013   Procedure: RIGHT THUMB [place] ARTHROPLASTY;  Surgeon: [name] Amor, MD;  Location: [place];  Service: Orthopedics;  Laterality: Right;   GANGLION CYST EXCISION Right 04/07/2013   Procedure: RIGHT WRIST MASS EXCISION;  Surgeon: [name] Amor, MD;  Location: [place];  Service: Orthopedics;  Laterality: Right;   KNEE ARTHROSCOPY  LEFT X3 (LAST ONE 2005)   KNEE  ARTHROSCOPY  05/17/2011   Procedure: ARTHROSCOPY KNEE;  Surgeon: [name] [name];  Location: [place];  Service: Orthopedics;  Laterality: Right;  WITH MEDIAL MENISECTOMY AND removal of suprapatella fat lump   LEFT WRIST TENOSYNECTOMY W/ LEFT THUMB JOINT REPAIR  02-24-10   LUMBAR FUSION  11-30-10   L4 - 5   LUMBAR FUSION  2000   L2 - 4   LUMBAR LAMINECTOMY  DEC 2011   L4 - 5   POSTERIOR CERVICAL FUSION/FORAMINOTOMY N/A 05/17/2017   Procedure: Cervical five  to Thorastic one  Posterior cervical fusion with lateral mass screws/revision of prior instrumentation;  Surgeon: [name] Levine, MD;  Location: [place];  Service: Neurosurgery;  Laterality: N/A;  C5 to T1 Posterior cervical fusion with lateral mass screws/revision of prior instrumentation   TENDON REPAIR  JAN 2010   LEFT INDEX AND LONG FINGERS   THUMB SURGERY   04/07/2019   RIGHT THUMB   TUBAL LIGATION     Past Medical History:  Diagnosis Date   Acute sinusitis, unspecified    Allergy    Anemia    "quite a few times"   Anxiety state, unspecified    Arachnoiditis BILATERAL LEGS   DUE TO MULTIPLE BACK SURG.'S   Arthritis    Asthma    last flare up was 03/2017 lasted over a month   Blood transfusion    Cancer ([place])    skin cancers (in scalp)   Cardiomyopathy HX --06/2010   EF was 25% during acute illness (PHELONEPHRITIS) Repeat echo 12-06-10 60% showed normal EF.    Chronic back pain greater than 3 months duration    S/P BACK SURG'S   CSF leak    Diabetes mellitus without complication ([employer])    dx 2013 type 2   Dyslipidemia    Dysphagia  some post op cerv fusion 2/14   Dysrhythmia    Essential hypertension, benign    Family history of adverse reaction to anesthesia    mother gets n/v   Family history of breast cancer 02/06/2022   Family history of liver cancer 02/06/2022   Family history of lung cancer 02/06/2022   Family history of ovarian cancer 02/06/2022   GERD (gastroesophageal reflux disease)  AND HIATIAL HERNIA   CONTROLLED W/ NEXIUM   Headache(784.0)    History of chronic bronchitis    Hx of bladder infections    Hyperlipidemia    TAKE CHLOSTEROL MEDICATION,04/23/19   Hypertension    Neuromuscular disorder (HCC)    numbness and tingling   Osteoporosis    Other malaise and fatigue    PONV (postoperative nausea and vomiting)    Shortness of breath    Spinal headache    Spinal stenosis, cervical region    Varicosities    venous   Weakness of both legs DUE TO ARACHNOIDITIS   OCCASIONAL USES CANE   BP 104/70   Ht '5\' 5"'$  (1.651 m)   Wt 157 lb (71.2 kg)   SpO2 95%   BMI 26.13 kg/m   Opioid Risk Score:   Fall Risk Score:  `1  Depression screen PHQ 2/9     03/13/2022    1:14 PM 12/29/2021   12:59 PM 11/16/2021    2:15 PM 09/13/2021    1:39 PM 08/16/2021    1:04 PM 04/19/2021    1:34 PM 02/18/2021    1:27 PM  Depression screen PHQ 2/9  Decreased Interest 0 0 0 0 0 1 0  Down, Depressed, Hopeless 0 0 0 0 0 1 0  PHQ - 2 Score 0 0 0 0 0 2 0    Review of Systems  Musculoskeletal:  Positive for arthralgias, back pain and neck pain.       Pain in both legs & both feet, both shoulders  All other systems reviewed and are negative.      Objective:   Physical Exam Vitals and nursing note reviewed.  Constitutional:      Appearance: Normal appearance.  Cardiovascular:     Rate and Rhythm: Normal rate and regular rhythm.     Pulses: Normal pulses.     Heart sounds: Normal heart sounds.  Pulmonary:     Effort: Pulmonary effort is normal.     Breath sounds: Normal breath sounds.  Musculoskeletal:     Cervical back: Normal range of motion and neck supple.     Comments: Normal Muscle Bulk and Muscle Testing Reveals:  Upper Extremities: Full ROM and Muscle Strength 5/5 Bilateral AC Joint Tenderness Thoracic Hypersensitivity Lumbar Paraspinal Tenderness: L-3-L-5 Lower Extremities: Full ROM and Muscle Strength 5/5 Arises from Table with ease Narrow Based  Gait      Skin:    General: Skin is warm and dry.  Neurological:     Mental Status: She is alert and oriented to person, place, and time.  Psychiatric:        Mood and Affect: Mood normal.        Behavior: Behavior normal.         Assessment & Plan:  1. On 05/17/2017 :C5C6C7 T1 Posterior Cervical Fusion with lateral mass fixation with AIRO Imaging, revision of prior instrumentation. By Dr. Vertell Limber.  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 Continue current medication regimen with  Gabapentin 800 mg BID. [place]: Oxycodone '15mg'$  one tablet  5 times a day  as needed for pain # 150 tablets We will continue the opioid monitoring program, this consists of regular clinic visits, examinations, urine drug screen, pill counts as well as use of [place] Controlled Substance Reporting system. 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 Continue to Monitor. 09/252023 3. Anxiety/depression: PCP Following. Continue to monitor. 04/13/2022. 4. Muscle Spasms: Continue current medication regime with Flexeril. Continue to Monitor. 10/262023 5. Cervicalgia/ Cervical Radiculitis: Dr Vertell Limber Following. ,Continue current medication regime with Gabapentin:  S/P  C5C6C7 T1 Posterior Cervical Fusion with lateral mass fixation with AIRO Imaging, revision of prior instrumentation by Dr. Vertell Limber on 05/17/2017. 04/13/2022 7. Bilateral Thoracic Back Pain: Continue HEP as Tolerated and Continue current medication regimen. Continue to Monitor. 04/13/2022  8. Left lower extremity DVT/ Phlebitis:  S/P endovenous laser ablation of left great saphenous vein on 05/09/2018 by Dr. Scot Dock:  Vascular  Following. 04/13/2022. 9. Insomnia: Continue Pamelor. Continue to Monitor. 04/13/2022. 10. Left Knee Pain: No complaints  today. Ortho Following. 10/262023  11. Fall at Home: Educated on [employer]. She verbalizes understanding.    F/U in 1 month

## 2022-04-21 ENCOUNTER — Ambulatory Visit
Admission: RE | Admit: 2022-04-21 | Discharge: 2022-04-21 | Disposition: A | Discharge status: Home / Self Care | Payer: Medicare Other | Source: Ambulatory Visit | Attending: Internal Medicine | Admitting: Internal Medicine

## 2022-04-21 DIAGNOSIS — Z1231 Encounter for screening mammogram for malignant neoplasm of breast: Secondary | ICD-10-CM

## 2022-05-03 ENCOUNTER — Ambulatory Visit (INDEPENDENT_AMBULATORY_CARE_PROVIDER_SITE_OTHER): Payer: Medicare Other | Admitting: Vascular Surgery

## 2022-05-03 ENCOUNTER — Encounter: Payer: Self-pay | Admitting: Vascular Surgery

## 2022-05-03 ENCOUNTER — Ambulatory Visit (HOSPITAL_COMMUNITY)
Admission: RE | Admit: 2022-05-03 | Discharge: 2022-05-03 | Disposition: A | Discharge status: Home / Self Care | Payer: Medicare Other | Source: Ambulatory Visit | Attending: Vascular Surgery | Admitting: Vascular Surgery

## 2022-05-03 VITALS — BP 144/70 | HR 73 | Temp 97.9°F | Resp 16 | Ht 65.0 in | Wt 155.0 lb

## 2022-05-03 DIAGNOSIS — M79662 Pain in left lower leg: Secondary | ICD-10-CM | POA: Diagnosis not present

## 2022-05-03 DIAGNOSIS — M7989 Other specified soft tissue disorders: Secondary | ICD-10-CM | POA: Diagnosis not present

## 2022-05-03 DIAGNOSIS — I872 Venous insufficiency (chronic) (peripheral): Secondary | ICD-10-CM | POA: Diagnosis not present

## 2022-05-03 DIAGNOSIS — I83812 Varicose veins of left lower extremities with pain: Secondary | ICD-10-CM | POA: Insufficient documentation

## 2022-05-03 DIAGNOSIS — I8393 Asymptomatic varicose veins of bilateral lower extremities: Secondary | ICD-10-CM

## 2022-05-03 NOTE — Progress Notes (Signed)
REASON FOR VISIT:   Follow-up of chronic venous insufficiency.  MEDICAL ISSUES:   CHRONIC VENOUS INSUFFICIENCY: This patient does describe some symptoms possibly consistent with venous hypertension more so on the left but her duplex scan does not show significant venous reflux that would be contributing to this.  She has no deep venous reflux on the left.  I think some of her symptoms can be attributed to her back issues.  We have discussed the importance of leg elevation and the proper positioning for this.  In addition I encouraged her to continue to exercise and avoid prolonged sitting and standing.  She should also wear her compression stockings when she will be standing for long period of time or sitting.  If her symptoms progress then certainly she will call and we can reevaluate.  Currently however I do not think she is a candidate for any further work on the left leg and lesser varicosities on the left progressed or symptoms progress significantly.  HPI:   [name] [name] is a pleasant 71 y.o. female who has undergone previous laser ablation of the left great saphenous vein in November 2019.  In December 2020 she had laser ablation of the right great saphenous vein.  She comes in for routine follow-up visit.  She does complain of some pain in both legs associated with sitting and standing but also has significant back issues and also a history of arachnoiditis.  He said no previous history of DVT.  She does elevate her legs some which helps.  She does wear compression stockings at times.  I do not get any history of claudication, rest pain, or nonhealing ulcers.  Past Medical History:  Diagnosis Date   Acute sinusitis, unspecified    Allergy    Anemia    "quite a few times"   Anxiety state, unspecified    Arachnoiditis BILATERAL LEGS   DUE TO MULTIPLE BACK SURG.'S   Arthritis    Asthma    last flare up was 03/2017 lasted over a month   Blood transfusion    Cancer ([place])     skin cancers (in scalp)   Cardiomyopathy HX --06/2010   EF was 25% during acute illness (PHELONEPHRITIS) Repeat echo 12-06-10 60% showed normal EF.    Chronic back pain greater than 3 months duration    S/P BACK SURG'S   CSF leak    Diabetes mellitus without complication ([place])    dx 2013 type 2   Dyslipidemia    Dysphagia    some post op cerv fusion 2/14   Dysrhythmia    Essential hypertension, benign    Family history of adverse reaction to anesthesia    mother gets n/v   Family history of breast cancer 02/06/2022   Family history of liver cancer 02/06/2022   Family history of lung cancer 02/06/2022   Family history of ovarian cancer 02/06/2022   GERD (gastroesophageal reflux disease) AND HIATIAL HERNIA   CONTROLLED W/ NEXIUM   Headache(784.0)    History of chronic bronchitis    Hx of bladder infections    Hyperlipidemia    TAKE CHLOSTEROL MEDICATION,04/23/19   Hypertension    Neuromuscular disorder (HCC)    numbness and tingling   Osteoporosis    Other malaise and fatigue    PONV (postoperative nausea and vomiting)    Shortness of breath    Spinal headache    Spinal stenosis, cervical region    Varicosities    venous  Weakness of both legs DUE TO ARACHNOIDITIS   OCCASIONAL USES CANE    Family History  Problem Relation Age of Onset   Hypertension Mother    Heart disease Mother        Mitral valve replacement.    Diabetes Mother    Liver cancer Father 4       [place]   Diabetes Brother    Lung cancer Brother 8       mets   Melanoma Brother 23       neck and back   Cancer Paternal Uncle        unknown type; dx after 83   Breast cancer Paternal Grandmother        dx 59s   Ovarian cancer Cousin        paternal cousin; d. 66s   Cancer Cousin        GYN; d. 2s   Colon cancer Neg Hx    Colon polyps Neg Hx    Esophageal cancer Neg Hx    Rectal cancer Neg Hx    Stomach cancer Neg Hx     SOCIAL HISTORY: Social History   Tobacco Use   Smoking status: Never    Smokeless tobacco: Never  Substance Use Topics   Alcohol use: No    Allergies  Allergen Reactions   Latex Itching and Rash   Morphine And Related Hives and Itching   Acetaminophen Nausea And Vomiting   Hydrocodone    Morphine    Oxycodone Hcl    Aspirin Nausea And Vomiting    Can take coated asa   Biaxin [Clarithromycin] Nausea And Vomiting   Clarithromycin Diarrhea   Codeine Itching   Darvocet [Propoxyphene N-Acetaminophen] Itching   Hydrocodone-Acetaminophen Itching   Lortab [Hydrocodone-Acetaminophen] Itching   Percocet [Oxycodone-Acetaminophen] Itching   Tramadol Nausea Only    Current Outpatient Medications  Medication Sig Dispense Refill   albuterol (PROVENTIL) (2.5 MG/3ML) 0.083% nebulizer solution Inhale into the lungs.     albuterol (VENTOLIN HFA) 108 (90 Base) MCG/ACT inhaler INHALE 2 PUFFS BY MOUTH EVERY 6 HOURS AS NEEDED FOR WHEEZING OR SHORTNESS OF BREATH 8.5 g 4   aspirin EC 81 MG tablet Take 81 mg by mouth at bedtime.      budesonide-formoterol (SYMBICORT) 160-4.5 MCG/ACT inhaler Inhale 2 puffs into the lungs 2 (two) times daily. 1 Inhaler 5   Calcium Carbonate-Vit D-Min 1200-1000 MG-UNIT CHEW Chew 1 tablet by mouth daily.     diltiazem (CARDIZEM CD) 240 MG 24 hr capsule Take by mouth.     gabapentin (NEURONTIN) 800 MG tablet Take 1 tablet (800 mg total) by mouth 2 (two) times daily. 60 tablet 3   losartan-hydrochlorothiazide (HYZAAR) 50-12.5 MG tablet Take 1 tablet by mouth daily.     metFORMIN (GLUCOPHAGE) 500 MG tablet Take 500 mg by mouth 2 (two) times daily with a meal.      montelukast (SINGULAIR) 10 MG tablet Take 10 mg by mouth at bedtime. Pt takes in the morning.     naloxone (NARCAN) nasal spray 4 mg/0.1 mL Place 1 spray into the nose once as needed (Just in case of Opiod Overdose).     nortriptyline (PAMELOR) 25 MG capsule Take 1 capsule (25 mg total) by mouth at bedtime. Three month supply 90 capsule 1   omeprazole (PRILOSEC) 40 MG capsule Take 40 mg  by mouth daily.     oxyCODONE (ROXICODONE) 15 MG immediate release tablet Take 1 tablet (15 mg total) by mouth  5 (five) times daily as needed for pain. Do Not Fill Before 10/ 26/2023 150 tablet 0   polyethylene glycol-electrolytes (TRILYTE) 420 g solution Take 4,000 mLs by mouth as directed. 4000 mL 0   promethazine (PHENERGAN) 25 MG tablet Take 25 mg by mouth as needed.      simvastatin (ZOCOR) 10 MG tablet Take by mouth.     topiramate (TOPAMAX) 25 MG tablet Take 1 tablet (25 mg total) by mouth 2 (two) times daily. 180 tablet 3   TRUE METRIX BLOOD GLUCOSE TEST test strip daily.     valACYclovir (VALTREX) 500 MG tablet Take 500 mg by mouth daily.      No current facility-administered medications for this visit.   Facility-Administered Medications Ordered in Other Visits  Medication Dose Route Frequency Provider Last Rate Last Admin   levalbuterol (XOPENEX) nebulizer solution 0.63 mg  0.63 mg Nebulization Once Parrett, Tammy S, NP        REVIEW OF SYSTEMS:  '[X]'$  denotes positive finding, '[ ]'$  denotes negative finding Cardiac  Comments:  Chest pain or chest pressure:    Shortness of breath upon exertion:    Short of breath when lying flat:    Irregular heart rhythm:        Vascular    Pain in calf, thigh, or hip brought on by ambulation:    Pain in feet at night that wakes you up from your sleep:     Blood clot in your veins:    Leg swelling:         Pulmonary    Oxygen at home:    Productive cough:     Wheezing:         Neurologic    Sudden weakness in arms or legs:     Sudden numbness in arms or legs:     Sudden onset of difficulty speaking or slurred speech:    Temporary loss of vision in one eye:     Problems with dizziness:         Gastrointestinal    Blood in stool:     Vomited blood:         Genitourinary    Burning when urinating:     Blood in urine:        Psychiatric    Major depression:         Hematologic    Bleeding problems:    Problems with blood  clotting too easily:        Skin    Rashes or ulcers:        Constitutional    Fever or chills:     PHYSICAL EXAM:   Vitals:   05/03/22 1457  BP: (!) 144/70  Pulse: 73  Resp: 16  Temp: 97.9 F (36.6 C)  TempSrc: Temporal  SpO2: 100%  Weight: 155 lb (70.3 kg)  Height: '5\' 5"'$  (1.651 m)    GENERAL: The patient is a well-nourished female, in no acute distress. The vital signs are documented above. CARDIAC: There is a regular rate and rhythm.  VASCULAR: I do not detect carotid bruits. She has palpable femoral, popliteal, and dorsalis pedis pulses bilaterally. She has biphasic posterior tibial signals with the Doppler bilaterally. She has telangiectasias bilaterally and also corona phlebectatica.  I did look at both great saphenous veins myself with the SonoSite. On the right side, the great saphenous vein appears to be successfully closed. On the left side there appears to be a vein in the thigh  with reflux down to the mid thigh.  There are some chronic thrombus in the vein proximally so I suspect she has had some recanalization of her previous laser ablation on the left.  The vein exits the fascia in the distal thigh. She does have some dilated varicose veins in her medial left calf as documented in the photograph below. PULMONARY: There is good air exchange bilaterally without wheezing or rales. ABDOMEN: Soft and non-tender with normal pitched bowel sounds.  MUSCULOSKELETAL: There are no major deformities or cyanosis. NEUROLOGIC: No focal weakness or paresthesias are detected. SKIN: There are no ulcers or rashes noted. PSYCHIATRIC: The patient has a normal affect.  DATA:    VENOUS DUPLEX: I have independently interpreted her venous duplex scan today.  This was of the left lower extremity only.  There was no evidence of DVT.  There was no deep venous reflux.  There was superficial venous reflux in the left great saphenous vein from the saphenofemoral junction to the mid thigh.   The diameters of the vein ranged from 3.9-5.1 mm.  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  In the mid thigh the vein was quite tortuous.  Below that the vein exited the fascia.  The results of the study are summarized on the diagram below.    Deitra Mayo [facility] [phone]

## 2022-05-05 NOTE — Patient Instructions (Signed)
GUISELLE MIAN  05/05/2022     '@PREFPERIOPPHARMACY'$ @   Your procedure is scheduled on 05/16/2022.   Report to [facility] at  0830  A.M.   Call this number if you have problems the morning of surgery:  [phone]  If you experience any cold or flu symptoms such as cough, fever, chills, shortness of breath, etc. between now and your scheduled surgery, please notify us at the above number.   Remember:  Follow the diet and prep instructions given to you by the office.     Use your nebulizer and your inhaler before you come and bring your rescue inhaler with you.     DO NOT take any medications for diabetes the morning of your procedure.     Take these medicines the morning of surgery with A SIP OF WATER         diltiazem, gabapentin, singulair, omeprazole, oxycodone (if needed), topamax.     Do not wear jewelry, make-up or nail polish.  Do not wear lotions, powders, or perfumes, or deodorant.  Do not shave 48 hours prior to surgery.  Men may shave face and neck.  Do not bring valuables to the hospital.  [facility] is not responsible for any belongings or valuables.  Contacts, dentures or bridgework may not be worn into surgery.  Leave your suitcase in the car.  After surgery it may be brought to your room.  For patients admitted to the hospital, discharge time will be determined by your treatment team.  Patients discharged the day of surgery will not be allowed to drive home and must have someone with them for 24 hours.    Special instructions:   DO NOT smoke tobacco or vape for 24 hours before your procedure.  Please read over the following fact sheets that you were given. Anesthesia Post-op Instructions and Care and Recovery After Surgery      Colonoscopy, Adult, Care After The following information offers guidance on how to care for yourself after your procedure. Your health care provider may also give you more specific instructions. If you have problems  or questions, contact your health care provider. What can I expect after the procedure? After the procedure, it is common to have: A small amount of blood in your stool for 24 hours after the procedure. Some gas. Mild cramping or bloating of your abdomen. Follow these instructions at home: Eating and drinking  Drink enough fluid to keep your urine pale yellow. Follow instructions from your health care provider about eating or drinking restrictions. Resume your normal diet as told by your health care provider. Avoid heavy or fried foods that are hard to digest. Activity Rest as told by your health care provider. Avoid sitting for a long time without moving. Get up to take short walks every 1-2 hours. This is important to improve blood flow and breathing. Ask for help if you feel weak or unsteady. Return to your normal activities as told by your health care provider. Ask your health care provider what activities are safe for you. Managing cramping and bloating  Try walking around when you have cramps or feel bloated. If directed, apply heat to your abdomen as told by your health care provider. Use the heat source that your health care provider recommends, such as a moist heat pack or a heating pad. Place a towel between your skin and the heat source. Leave the heat on for 20-30 minutes. Remove the heat  if your skin turns bright red. This is especially important if you are unable to feel pain, heat, or cold. You have a greater risk of getting burned. General instructions If you were given a sedative during the procedure, it can affect you for several hours. Do not drive or operate machinery until your health care provider says that it is safe. For the first 24 hours after the procedure: Do not sign important documents. Do not drink alcohol. Do your regular daily activities at a slower pace than normal. Eat soft foods that are easy to digest. Take over-the-counter and prescription medicines  only as told by your health care provider. Keep all follow-up visits. This is important. Contact a health care provider if: You have blood in your stool 2-3 days after the procedure. Get help right away if: You have more than a small spotting of blood in your stool. You have large blood clots in your stool. You have swelling of your abdomen. You have nausea or vomiting. You have a fever. You have increasing pain in your abdomen that is not relieved with medicine. These symptoms may be an emergency. Get help right away. Call 911. Do not wait to see if the symptoms will go away. Do not drive yourself to the hospital. Summary After the procedure, it is common to have a small amount of blood in your stool. You may also have mild cramping and bloating of your abdomen. If you were given a sedative during the procedure, it can affect you for several hours. Do not drive or operate machinery until your health care provider says that it is safe. Get help right away if you have a lot of blood in your stool, nausea or vomiting, a fever, or increased pain in your abdomen. This information is not intended to replace advice given to you by your health care provider. Make sure you discuss any questions you have with your health care provider. Document Revised: 01/26/2021 Document Reviewed: 01/26/2021 Elsevier Patient Education  [place] After The following information offers guidance on how to care for yourself after your procedure. Your health care provider may also give you more specific instructions. If you have problems or questions, contact your health care provider. What can I expect after the procedure? After the procedure, it is common to have: Tiredness. Little or no memory about what happened during or after the procedure. Impaired judgment when it comes to making decisions. Nausea or vomiting. Some trouble with balance. Follow these instructions at  home: For the time period you were told by your health care provider:  Rest. Do not participate in activities where you could fall or become injured. Do not drive or use machinery. Do not drink alcohol. Do not take sleeping pills or medicines that cause drowsiness. Do not make important decisions or sign legal documents. Do not take care of children on your own. Medicines Take over-the-counter and prescription medicines only as told by your health care provider. If you were prescribed antibiotics, take them as told by your health care provider. Do not stop using the antibiotic even if you start to feel better. Eating and drinking Follow instructions from your health care provider about what you may eat and drink. Drink enough fluid to keep your urine pale yellow. If you vomit: Drink clear fluids slowly and in small amounts as you are able. Clear fluids include water, ice chips, low-calorie sports drinks, and fruit juice that has water added to it (  diluted fruit juice). Eat light and bland foods in small amounts as you are able. These foods include bananas, applesauce, rice, lean meats, toast, and crackers. General instructions  Have a responsible adult stay with you for the time you are told. It is important to have someone help care for you until you are awake and alert. If you have sleep apnea, surgery and some medicines can increase your risk for breathing problems. Follow instructions from your health care provider about wearing your sleep device: When you are sleeping. This includes during daytime naps. While taking prescription pain medicines, sleeping medicines, or medicines that make you drowsy. Do not use any products that contain nicotine or tobacco. These products include cigarettes, chewing tobacco, and vaping devices, such as e-cigarettes. If you need help quitting, ask your health care provider. Contact a health care provider if: You feel nauseous or vomit every time you eat  or drink. You feel light-headed. You are still sleepy or having trouble with balance after 24 hours. You get a rash. You have a fever. You have redness or swelling around the IV site. Get help right away if: You have trouble breathing. You have new confusion after you get home. These symptoms may be an emergency. Get help right away. Call 911. Do not wait to see if the symptoms will go away. Do not drive yourself to the hospital. This information is not intended to replace advice given to you by your health care provider. Make sure you discuss any questions you have with your health care provider. Document Revised: 10/31/2021 Document Reviewed: 10/31/2021 Elsevier Patient Education  [place].

## 2022-05-08 ENCOUNTER — Encounter: Payer: Self-pay | Admitting: Registered Nurse

## 2022-05-08 ENCOUNTER — Encounter: Payer: Medicare Other | Attending: Physical Medicine & Rehabilitation | Admitting: Registered Nurse

## 2022-05-08 VITALS — BP 127/76 | HR 81 | Ht 65.0 in | Wt 160.0 lb

## 2022-05-08 DIAGNOSIS — R519 Headache, unspecified: Secondary | ICD-10-CM | POA: Diagnosis not present

## 2022-05-08 DIAGNOSIS — Z79891 Long term (current) use of opiate analgesic: Secondary | ICD-10-CM | POA: Diagnosis not present

## 2022-05-08 DIAGNOSIS — M5412 Radiculopathy, cervical region: Secondary | ICD-10-CM

## 2022-05-08 DIAGNOSIS — Z5181 Encounter for therapeutic drug level monitoring: Secondary | ICD-10-CM | POA: Diagnosis not present

## 2022-05-08 DIAGNOSIS — M542 Cervicalgia: Secondary | ICD-10-CM | POA: Diagnosis not present

## 2022-05-08 DIAGNOSIS — G894 Chronic pain syndrome: Secondary | ICD-10-CM

## 2022-05-08 DIAGNOSIS — G039 Meningitis, unspecified: Secondary | ICD-10-CM

## 2022-05-08 DIAGNOSIS — M546 Pain in thoracic spine: Secondary | ICD-10-CM | POA: Diagnosis not present

## 2022-05-08 DIAGNOSIS — M961 Postlaminectomy syndrome, not elsewhere classified: Secondary | ICD-10-CM | POA: Diagnosis not present

## 2022-05-08 DIAGNOSIS — G8929 Other chronic pain: Secondary | ICD-10-CM

## 2022-05-08 DIAGNOSIS — M5416 Radiculopathy, lumbar region: Secondary | ICD-10-CM

## 2022-05-08 MED ORDER — GABAPENTIN 800 MG PO TABS: 800.0000 mg | ORAL_TABLET | Freq: Two times a day (BID) | ORAL | 3 refills | Status: DC

## 2022-05-08 MED ORDER — OXYCODONE HCL 15 MG PO TABS: 15.0000 mg | ORAL_TABLET | Freq: Every day | ORAL | 0 refills | Status: DC | PRN

## 2022-05-08 NOTE — Progress Notes (Signed)
Subjective:    Patient ID: [name] [name], female    DOB: 15-Jun-1951, 71 y.o.   MRN: [id number]  HPI: [name] [name] is a 71 y.o. female who returns for follow up appointment for chronic pain and medication refill. She states her pain is located in her neck radiating into her bilateral shoulders and lower back pain radiating into bilateral lower extremities. She rates her pain 8. Her current exercise regime is walking and performing stretching exercises.  [name] Morphine equivalent is 112.50 MME.   Last UDS was Performed on 03/13/2022, it was consistent.    Pain Inventory Average Pain 7 Pain Right Now 8 My pain is sharp, burning, stabbing, tingling, and aching  In the last 24 hours, has pain interfered with the following? General activity 7 Relation with others 7 Enjoyment of life 7 What TIME of day is your pain at its worst? morning , daytime, evening, and night Sleep (in general) Fair  Pain is worse with: walking, bending, sitting, inactivity, standing, and some activites Pain improves with: rest, pacing activities, medication, and walking Relief from Meds: 7  Family History  Problem Relation Age of Onset   Hypertension Mother    Heart disease Mother        Mitral valve replacement.    Diabetes Mother    Liver cancer Father 31       [place]   Diabetes Brother    Lung cancer Brother 74       mets   Melanoma Brother 57       neck and back   Cancer Paternal Uncle        unknown type; dx after 3   Breast cancer Paternal Grandmother        dx 51s   Ovarian cancer Cousin        paternal cousin; d. 80s   Cancer Cousin        GYN; d. 95s   Colon cancer Neg Hx    Colon polyps Neg Hx    Esophageal cancer Neg Hx    Rectal cancer Neg Hx    Stomach cancer Neg Hx    Social History   Socioeconomic History   Marital status: Divorced    Spouse name: Not on file   Number of children: Not on file   Years of education: Not on file   Highest education level: Not on file   Occupational History   Occupation: disable    Employer: DISABLED  Tobacco Use   Smoking status: Never   Smokeless tobacco: Never  Vaping Use   Vaping Use: Never used  Substance and Sexual Activity   Alcohol use: No   Drug use: No   Sexual activity: Not on file    Comment: second hand smoke  Other Topics Concern   Not on file  Social History Narrative   Has one child lives at home with her.    Social Determinants of Health   Financial Resource Strain: Not on file  Food Insecurity: Not on file  Transportation Needs: Not on file  Physical Activity: Not on file  Stress: Not on file  Social Connections: Not on file   Past Surgical History:  Procedure Laterality Date   ABDOMINAL HYSTERECTOMY  1987   W/ [place] DECOMP/DISCECTOMY FUSION N/A 08/09/2012   Procedure: ANTERIOR CERVICAL DECOMPRESSION/DISCECTOMY FUSION 1 LEVEL;  Surgeon: Floyce Stakes, MD;  Location: [facility];  Service: Neurosurgery;  Laterality: N/A;  Cervical four-five  Anterior cervical decompression/diskectomy/fusion   ANTERIOR FUSION CERVICAL SPINE  2007   C5 -6   APPENDECTOMY     APPLICATION OF INTRAOPERATIVE CT SCAN N/A 05/17/2017   Procedure: APPLICATION OF INTRAOPERATIVE CT SCAN;  Surgeon: Erline Levine, MD;  Location: [place];  Service: Neurosurgery;  Laterality: N/A;   BACK SURGERY     x 5   BREAST EXCISIONAL BIOPSY Bilateral    No scar seen    BREAST SURGERY     x 2 biopsies   CARPAL TUNNEL RELEASE  RIGHT - 2001  & DEC 2011 W/ BACK SURG.   CERVICAL DISC SURGERY  2005   C5 - 6   CHOLECYSTECTOMY  1994   DORSAL COMPARTMENT RELEASE Right 04/07/2013   Procedure: RIGHT WRIST STS RELEASE;  Surgeon: Schuyler Amor, MD;  Location: [place];  Service: Orthopedics;  Laterality: Right;   ENDOVENOUS ABLATION SAPHENOUS VEIN W/ LASER Right 05/29/2019   endovenous laser ablation right greater saphenous vein by Gae Gallop MD    EXPLORATION OF INCISION FOR CSF LEAK  DEC  2011  X2   POST [place] Right 04/07/2013   Procedure: RIGHT INDEX AND RIGHT LONG DISTAL INTERPHALANGEAL JOINT FUSIONS;  Surgeon: Schuyler Amor, MD;  Location: [place];  Service: Orthopedics;  Laterality: Right;   FINGER ARTHROPLASTY Right 04/07/2013   Procedure: RIGHT THUMB [place] ARTHROPLASTY;  Surgeon: Schuyler Amor, MD;  Location: [place];  Service: Orthopedics;  Laterality: Right;   GANGLION CYST EXCISION Right 04/07/2013   Procedure: RIGHT WRIST MASS EXCISION;  Surgeon: Schuyler Amor, MD;  Location: [place];  Service: Orthopedics;  Laterality: Right;   KNEE ARTHROSCOPY  LEFT X3 (LAST ONE 2005)   KNEE ARTHROSCOPY  05/17/2011   Procedure: ARTHROSCOPY KNEE;  Surgeon: Bradley Ferris III;  Location: [place];  Service: Orthopedics;  Laterality: Right;  WITH MEDIAL MENISECTOMY AND removal of suprapatella fat lump   LEFT WRIST TENOSYNECTOMY W/ LEFT THUMB JOINT REPAIR  02-24-10   LUMBAR FUSION  11-30-10   L4 - 5   LUMBAR FUSION  2000   L2 - 4   LUMBAR LAMINECTOMY  DEC 2011   L4 - 5   POSTERIOR CERVICAL FUSION/FORAMINOTOMY N/A 05/17/2017   Procedure: Cervical five  to Thorastic one  Posterior cervical fusion with lateral mass screws/revision of prior instrumentation;  Surgeon: Erline Levine, MD;  Location: [place];  Service: Neurosurgery;  Laterality: N/A;  C5 to T1 Posterior cervical fusion with lateral mass screws/revision of prior instrumentation   TENDON REPAIR  JAN 2010   LEFT INDEX AND LONG FINGERS   THUMB SURGERY   04/07/2019   RIGHT THUMB   TUBAL LIGATION     Past Surgical History:  Procedure Laterality Date   ABDOMINAL HYSTERECTOMY  1987   W/ BSO   ANTERIOR CERVICAL DECOMP/DISCECTOMY FUSION N/A 08/09/2012   Procedure: ANTERIOR CERVICAL DECOMPRESSION/DISCECTOMY FUSION 1 LEVEL;  Surgeon: Floyce Stakes, MD;  [facility];  Service: Neurosurgery;  Laterality: N/A;   Cervical four-five  Anterior cervical decompression/diskectomy/fusion   ANTERIOR FUSION CERVICAL SPINE  2007   C5 -6   APPENDECTOMY     APPLICATION OF INTRAOPERATIVE CT SCAN N/A 05/17/2017   Procedure: APPLICATION OF INTRAOPERATIVE CT SCAN;  Surgeon: Erline Levine, MD;  Location: [place];  Service: Neurosurgery;  Laterality: N/A;   BACK SURGERY     x 5   BREAST EXCISIONAL BIOPSY Bilateral  No scar seen    BREAST SURGERY     x 2 biopsies   CARPAL TUNNEL RELEASE  RIGHT - 2001  & DEC 2011 W/ BACK SURG.   CERVICAL DISC SURGERY  2005   C5 - 6   CHOLECYSTECTOMY  1994   DORSAL COMPARTMENT RELEASE Right 04/07/2013   Procedure: RIGHT WRIST STS RELEASE;  Surgeon: Schuyler Amor, MD;  Location: [place];  Service: Orthopedics;  Laterality: Right;   ENDOVENOUS ABLATION SAPHENOUS VEIN W/ LASER Right 05/29/2019   endovenous laser ablation right greater saphenous vein by Gae Gallop MD    EXPLORATION OF INCISION FOR CSF LEAK  DEC 2011  X2   POST [place] Right 04/07/2013   Procedure: RIGHT INDEX AND RIGHT LONG DISTAL INTERPHALANGEAL JOINT FUSIONS;  Surgeon: Schuyler Amor, MD;  Location: [place];  Service: Orthopedics;  Laterality: Right;   FINGER ARTHROPLASTY Right 04/07/2013   Procedure: RIGHT THUMB [place] ARTHROPLASTY;  Surgeon: Schuyler Amor, MD;  Location: [place];  Service: Orthopedics;  Laterality: Right;   GANGLION CYST EXCISION Right 04/07/2013   Procedure: RIGHT WRIST MASS EXCISION;  Surgeon: Schuyler Amor, MD;  Location: [place];  Service: Orthopedics;  Laterality: Right;   KNEE ARTHROSCOPY  LEFT X3 (LAST ONE 2005)   KNEE ARTHROSCOPY  05/17/2011   Procedure: ARTHROSCOPY KNEE;  Surgeon: Bradley Ferris III;  Location: [place];  Service: Orthopedics;  Laterality: Right;  WITH MEDIAL MENISECTOMY AND removal of suprapatella fat lump   LEFT WRIST TENOSYNECTOMY W/  LEFT THUMB JOINT REPAIR  02-24-10   LUMBAR FUSION  11-30-10   L4 - 5   LUMBAR FUSION  2000   L2 - 4   LUMBAR LAMINECTOMY  DEC 2011   L4 - 5   POSTERIOR CERVICAL FUSION/FORAMINOTOMY N/A 05/17/2017   Procedure: Cervical five  to Thorastic one  Posterior cervical fusion with lateral mass screws/revision of prior instrumentation;  Surgeon: Erline Levine, MD;  Location: [place];  Service: Neurosurgery;  Laterality: N/A;  C5 to T1 Posterior cervical fusion with lateral mass screws/revision of prior instrumentation   TENDON REPAIR  JAN 2010   LEFT INDEX AND LONG FINGERS   THUMB SURGERY   04/07/2019   RIGHT THUMB   TUBAL LIGATION     Past Medical History:  Diagnosis Date   Acute sinusitis, unspecified    Allergy    Anemia    "quite a few times"   Anxiety state, unspecified    Arachnoiditis BILATERAL LEGS   DUE TO MULTIPLE BACK SURG.'S   Arthritis    Asthma    last flare up was 03/2017 lasted over a month   Blood transfusion    Cancer ([place])    skin cancers (in scalp)   Cardiomyopathy HX --06/2010   EF was 25% during acute illness (PHELONEPHRITIS) Repeat echo 12-06-10 60% showed normal EF.    Chronic back pain greater than 3 months duration    S/P BACK SURG'S   CSF leak    Diabetes mellitus without complication ([place])    dx 2013 type 2   Dyslipidemia    Dysphagia    some post op cerv fusion 2/14   Dysrhythmia    Essential hypertension, benign    Family history of adverse reaction to anesthesia    mother gets n/v   Family history of breast cancer 02/06/2022   Family history of liver cancer 02/06/2022  Family history of lung cancer 02/06/2022   Family history of ovarian cancer 02/06/2022   GERD (gastroesophageal reflux disease) AND HIATIAL HERNIA   CONTROLLED W/ NEXIUM   Headache(784.0)    History of chronic bronchitis    Hx of bladder infections    Hyperlipidemia    TAKE CHLOSTEROL MEDICATION,04/23/19   Hypertension    Neuromuscular disorder (HCC)    numbness and tingling    Osteoporosis    Other malaise and fatigue    PONV (postoperative nausea and vomiting)    Shortness of breath    Spinal headache    Spinal stenosis, cervical region    Varicosities    venous   Weakness of both legs DUE TO ARACHNOIDITIS   OCCASIONAL USES CANE   BP 127/76   Pulse 81   Ht '5\' 5"'$  (1.651 m)   Wt 160 lb (72.6 kg)   SpO2 95%   BMI 26.63 kg/m   Opioid Risk Score:   Fall Risk Score:  `1  Depression screen PHQ 2/9     04/13/2022    1:35 PM 03/13/2022    1:14 PM 12/29/2021   12:59 PM 11/16/2021    2:15 PM 09/13/2021    1:39 PM 08/16/2021    1:04 PM 04/19/2021    1:34 PM  Depression screen PHQ 2/9  Decreased Interest 0 0 0 0 0 0 1  Down, Depressed, Hopeless 0 0 0 0 0 0 1  PHQ - 2 Score 0 0 0 0 0 0 2      Review of Systems  Musculoskeletal:  Positive for back pain and neck pain.       Bilateral leg pain  All other systems reviewed and are negative.      Objective:   Physical Exam Vitals and nursing note reviewed.  Constitutional:      Appearance: Normal appearance.  Cardiovascular:     Rate and Rhythm: Normal rate and regular rhythm.     Pulses: Normal pulses.     Heart sounds: Normal heart sounds.  Pulmonary:     Effort: Pulmonary effort is normal.     Breath sounds: Normal breath sounds.  Musculoskeletal:     Cervical back: Normal range of motion and neck supple.     Comments: Normal Muscle Bulk and Muscle Testing Reveals:  Upper Extremities: Full ROM and Muscle Strength 5/5 Thoracic Paraspinal Tenderness: T-7-T-9  Lumbar Paraspinal Tenderness: L-3-L-5 Lower Extremities: Full ROM and Muscle Strength 5/5 Arises from Chair with Ease Narrow Based Gait     Skin:    General: Skin is warm and dry.  Neurological:     Mental Status: She is alert and oriented to person, place, and time.  Psychiatric:        Mood and Affect: Mood normal.        Behavior: Behavior normal.         Assessment & Plan:  1. On 05/17/2017 :C5C6C7 T1 Posterior Cervical  Fusion with lateral mass fixation with AIRO Imaging, revision of prior instrumentation. By Dr. Vertell Limber.  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 Continue current medication regimen with  Gabapentin 800 mg BID. [place]: Oxycodone '15mg'$  one tablet 5 times a day  as needed for pain # 150 tablets We will continue the opioid monitoring program, this consists of regular clinic visits, examinations, urine drug screen, pill counts as well as use of [place] Controlled Substance Reporting system. 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 Continue to Monitor. 11/202023 3. Anxiety/depression: PCP Following. Continue to monitor. 05/08/2022. 4. Muscle Spasms: Continue current medication regime with Flexeril. Continue to Monitor. 11/202023 5. Cervicalgia/ Cervical Radiculitis: Dr Vertell Limber Following. ,Continue current medication regime with Gabapentin:  S/P  C5C6C7 T1 Posterior Cervical Fusion with lateral mass fixation with AIRO Imaging, revision of prior instrumentation by Dr. Vertell Limber on 05/17/2017. 05/08/2022 7. Bilateral Thoracic Back Pain: Continue HEP as Tolerated and Continue current medication regimen. Continue to Monitor. 05/08/2022  8. Left lower extremity DVT/ Phlebitis:  S/P endovenous laser ablation of left great saphenous vein on 05/09/2018 by Dr. Scot Dock:  Vascular  Following. 05/08/2022. 9. Insomnia: Continue Pamelor. Continue to Monitor. 05/08/2022.      F/U in 1 month

## 2022-05-10 ENCOUNTER — Other Ambulatory Visit (INDEPENDENT_AMBULATORY_CARE_PROVIDER_SITE_OTHER): Payer: Self-pay | Admitting: Gastroenterology

## 2022-05-10 ENCOUNTER — Encounter (HOSPITAL_COMMUNITY): Payer: Self-pay

## 2022-05-10 ENCOUNTER — Encounter (HOSPITAL_COMMUNITY)
Admission: RE | Admit: 2022-05-10 | Discharge: 2022-05-10 | Disposition: A | Discharge status: Home / Self Care | Payer: Medicare Other | Source: Ambulatory Visit | Attending: Gastroenterology | Admitting: Gastroenterology

## 2022-05-10 VITALS — BP 132/68 | HR 85 | Temp 97.9°F | Resp 18 | Ht 65.0 in | Wt 160.0 lb

## 2022-05-10 DIAGNOSIS — Z862 Personal history of diseases of the blood and blood-forming organs and certain disorders involving the immune mechanism: Secondary | ICD-10-CM | POA: Diagnosis not present

## 2022-05-10 DIAGNOSIS — E119 Type 2 diabetes mellitus without complications: Secondary | ICD-10-CM | POA: Diagnosis not present

## 2022-05-10 DIAGNOSIS — E876 Hypokalemia: Secondary | ICD-10-CM

## 2022-05-10 DIAGNOSIS — Z01818 Encounter for other preprocedural examination: Secondary | ICD-10-CM | POA: Insufficient documentation

## 2022-05-10 DIAGNOSIS — I10 Essential (primary) hypertension: Secondary | ICD-10-CM | POA: Insufficient documentation

## 2022-05-10 LAB — CBC WITH DIFFERENTIAL/PLATELET
Abs Immature Granulocytes: 0.01 10*3/uL (ref 0.00–0.07)
Basophils Absolute: 0 10*3/uL (ref 0.0–0.1)
Basophils Relative: 0 %
Eosinophils Absolute: 0.1 10*3/uL (ref 0.0–0.5)
Eosinophils Relative: 2 %
HCT: 38.4 % (ref 36.0–46.0)
Hemoglobin: 12.2 g/dL (ref 12.0–15.0)
Immature Granulocytes: 0 %
Lymphocytes Relative: 33 %
Lymphs Abs: 1.9 10*3/uL (ref 0.7–4.0)
MCH: 31.2 pg (ref 26.0–34.0)
MCHC: 31.8 g/dL (ref 30.0–36.0)
MCV: 98.2 fL (ref 80.0–100.0)
Monocytes Absolute: 0.4 10*3/uL (ref 0.1–1.0)
Monocytes Relative: 7 %
Neutro Abs: 3.4 10*3/uL (ref 1.7–7.7)
Neutrophils Relative %: 58 %
Platelets: 249 10*3/uL (ref 150–400)
RBC: 3.91 MIL/uL (ref 3.87–5.11)
RDW: 13.1 % (ref 11.5–15.5)
WBC: 5.8 10*3/uL (ref 4.0–10.5)
nRBC: 0 % (ref 0.0–0.2)

## 2022-05-10 LAB — BASIC METABOLIC PANEL
Anion gap: 6 (ref 5–15)
BUN: 9 mg/dL (ref 8–23)
CO2: 26 mmol/L (ref 22–32)
Calcium: 8.8 mg/dL — ABNORMAL LOW (ref 8.9–10.3)
Chloride: 107 mmol/L (ref 98–111)
Creatinine, Ser: 0.62 mg/dL (ref 0.44–1.00)
GFR, Estimated: >60 mL/min (ref >60)
Glucose, Bld: 92 mg/dL (ref 70–99)
Potassium: 3.3 mmol/L — ABNORMAL LOW (ref 3.5–5.1)
Sodium: 139 mmol/L (ref 135–145)

## 2022-05-10 MED ORDER — POTASSIUM CHLORIDE CRYS ER 20 MEQ PO TBCR: 40.0000 meq | EXTENDED_RELEASE_TABLET | Freq: Every day | ORAL | 0 refills | Status: DC

## 2022-05-16 ENCOUNTER — Ambulatory Visit (HOSPITAL_COMMUNITY)
Admission: RE | Admit: 2022-05-16 | Discharge: 2022-05-16 | Disposition: A | Discharge status: Home / Self Care | Payer: Medicare Other | Attending: Gastroenterology | Admitting: Gastroenterology

## 2022-05-16 ENCOUNTER — Encounter (HOSPITAL_COMMUNITY)
Admission: RE | Disposition: A | Discharge status: Home / Self Care | Payer: Self-pay | Source: Home / Self Care | Attending: Gastroenterology

## 2022-05-16 ENCOUNTER — Ambulatory Visit (HOSPITAL_COMMUNITY): Payer: Medicare Other | Admitting: Anesthesiology

## 2022-05-16 ENCOUNTER — Encounter (HOSPITAL_COMMUNITY): Payer: Self-pay | Admitting: Gastroenterology

## 2022-05-16 ENCOUNTER — Ambulatory Visit (HOSPITAL_BASED_OUTPATIENT_CLINIC_OR_DEPARTMENT_OTHER): Payer: Medicare Other | Admitting: Anesthesiology

## 2022-05-16 DIAGNOSIS — Z1211 Encounter for screening for malignant neoplasm of colon: Secondary | ICD-10-CM | POA: Diagnosis not present

## 2022-05-16 DIAGNOSIS — K648 Other hemorrhoids: Secondary | ICD-10-CM | POA: Diagnosis not present

## 2022-05-16 DIAGNOSIS — Z79899 Other long term (current) drug therapy: Secondary | ICD-10-CM | POA: Diagnosis not present

## 2022-05-16 DIAGNOSIS — I341 Nonrheumatic mitral (valve) prolapse: Secondary | ICD-10-CM | POA: Diagnosis not present

## 2022-05-16 DIAGNOSIS — K5730 Diverticulosis of large intestine without perforation or abscess without bleeding: Secondary | ICD-10-CM | POA: Diagnosis not present

## 2022-05-16 DIAGNOSIS — I429 Cardiomyopathy, unspecified: Secondary | ICD-10-CM | POA: Insufficient documentation

## 2022-05-16 DIAGNOSIS — J45909 Unspecified asthma, uncomplicated: Secondary | ICD-10-CM | POA: Diagnosis not present

## 2022-05-16 DIAGNOSIS — R519 Headache, unspecified: Secondary | ICD-10-CM | POA: Diagnosis not present

## 2022-05-16 DIAGNOSIS — I10 Essential (primary) hypertension: Secondary | ICD-10-CM

## 2022-05-16 DIAGNOSIS — E119 Type 2 diabetes mellitus without complications: Secondary | ICD-10-CM | POA: Diagnosis not present

## 2022-05-16 DIAGNOSIS — Z7984 Long term (current) use of oral hypoglycemic drugs: Secondary | ICD-10-CM | POA: Diagnosis not present

## 2022-05-16 DIAGNOSIS — E785 Hyperlipidemia, unspecified: Secondary | ICD-10-CM | POA: Insufficient documentation

## 2022-05-16 DIAGNOSIS — K219 Gastro-esophageal reflux disease without esophagitis: Secondary | ICD-10-CM | POA: Diagnosis not present

## 2022-05-16 HISTORY — PX: COLONOSCOPY WITH PROPOFOL: SHX5780

## 2022-05-16 LAB — GLUCOSE, CAPILLARY: Glucose-Capillary: 88 mg/dL (ref 70–99)

## 2022-05-16 LAB — HM COLONOSCOPY

## 2022-05-16 SURGERY — COLONOSCOPY WITH PROPOFOL
Anesthesia: General

## 2022-05-16 MED ORDER — LACTATED RINGERS IV SOLN: INTRAVENOUS | Status: DC

## 2022-05-16 MED ORDER — PROPOFOL 500 MG/50ML IV EMUL
INTRAVENOUS | Status: DC | PRN
  Administered 2022-05-16: 100 ug/kg/min via INTRAVENOUS

## 2022-05-16 MED ORDER — PROPOFOL 10 MG/ML IV BOLUS
INTRAVENOUS | Status: DC | PRN
  Administered 2022-05-16: 20 mg via INTRAVENOUS
  Administered 2022-05-16: 30 mg via INTRAVENOUS
  Administered 2022-05-16: 20 mg via INTRAVENOUS

## 2022-05-16 NOTE — Op Note (Signed)
[facility] Patient Name: [name] [name] Procedure Date: 05/16/2022 10:09 AM MRN: [id number] Date of Birth: 11-27-1950 Attending MD: Maylon Peppers , , [id number] CSN: [id number] Age: 71 Admit Type: Outpatient Procedure:                Colonoscopy Indications:              Screening for colorectal malignant neoplasm Providers:                Maylon Peppers, Janeece Riggers, RN, Aram Candela Referring MD:              Medicines:                Monitored Anesthesia Care Complications:            No immediate complications. Estimated Blood Loss:     Estimated blood loss: none. Procedure:                Pre-Anesthesia Assessment:                           - Prior to the procedure, a History and Physical                            was performed, and patient medications, allergies                            and sensitivities were reviewed. [name]                            tolerance of previous anesthesia was reviewed.                           - The risks and benefits of the procedure and the                            sedation options and risks were discussed with the                            patient. All questions were answered and informed                            consent was obtained.                           - ASA Grade Assessment: III - A patient with severe                            systemic disease.                           After obtaining informed consent, the colonoscope                            was passed under direct vision. Throughout the                            procedure, the patient's blood  pressure, pulse, and                            oxygen saturations were monitored continuously. The                            [id number]-[id number] ([id number]) scope was introduced through                            the anus and advanced to the the terminal ileum.                            The colonoscopy was performed without difficulty.                            The  patient tolerated the procedure well. The                            quality of the bowel preparation was adequate. Scope In: 10:27:08 AM Scope Out: 10:45:38 AM Scope Withdrawal Time: 0 hours 11 minutes 28 seconds  Total Procedure Duration: 0 hours 18 minutes 30 seconds  Findings:      The perianal and digital rectal examinations were normal.      The terminal ileum appeared normal.      Scattered small-mouthed diverticula were found in the sigmoid colon and       descending colon.      Non-bleeding internal hemorrhoids were found during retroflexion. The       hemorrhoids were small. Impression:               - The examined portion of the ileum was normal.                           - Diverticulosis in the sigmoid colon and in the                            descending colon.                           - Non-bleeding internal hemorrhoids.                           - No specimens collected. Moderate Sedation:      Per Anesthesia Care Recommendation:           - Discharge patient to home (ambulatory).                           - Resume previous diet.                           - Repeat colonoscopy in 10 years for screening                            purposes. Procedure Code(s):        --- Professional ---  F7494, Colorectal cancer screening; colonoscopy on                            individual not meeting criteria for high risk Diagnosis Code(s):        --- Professional ---                           Z12.11, Encounter for screening for malignant                            neoplasm of colon                           K64.8, Other hemorrhoids                           K57.30, Diverticulosis of large intestine without                            perforation or abscess without bleeding CPT copyright 2022 American [employer] Association. All rights reserved. The codes documented in this report are preliminary and upon coder review may  be revised to meet current compliance  requirements. Maylon Peppers, MD Maylon Peppers,  05/16/2022 10:50:13 AM This report has been signed electronically. Number of Addenda: 0

## 2022-05-16 NOTE — Anesthesia Postprocedure Evaluation (Signed)
Anesthesia Post Note  Patient: [name] [name]  Procedure(s) Performed: COLONOSCOPY WITH PROPOFOL  Patient location during evaluation: Phase II Anesthesia Type: General Level of consciousness: awake and alert and oriented Pain management: pain level controlled Vital Signs Assessment: post-procedure vital signs reviewed and stable Respiratory status: spontaneous breathing, nonlabored ventilation and respiratory function stable Cardiovascular status: blood pressure returned to baseline and stable Postop Assessment: no apparent nausea or vomiting Anesthetic complications: no  No notable events documented.   Last Vitals:  Vitals:   05/16/22 0925 05/16/22 1050  BP: (!) 131/59 125/73  Pulse: 73 71  Resp: 16 18  Temp: 36.6 C 36.7 C  SpO2: 100% 100%    Last Pain:  Vitals:   05/16/22 1050  TempSrc: Oral  PainSc: 0-No pain                 Kiesha Ensey C Martavia Tye

## 2022-05-16 NOTE — Transfer of Care (Signed)
Immediate Anesthesia Transfer of Care Note  Patient: [name] [name]  Procedure(s) Performed: COLONOSCOPY WITH PROPOFOL  Patient Location: Short Stay  Anesthesia Type:General  Level of Consciousness: awake, alert , oriented, and patient cooperative  Airway & Oxygen Therapy: Patient Spontanous Breathing  Post-op Assessment: Report given to RN and Post -op Vital signs reviewed and stable  Post vital signs: Reviewed and stable  Last Vitals:  Vitals Value Taken Time  BP 125/73 05/16/22 1050  Temp 36.7 C 05/16/22 1050  Pulse 71 05/16/22 1050  Resp 18 05/16/22 1050  SpO2 100 % 05/16/22 1050    Last Pain:  Vitals:   05/16/22 1050  TempSrc: Oral  PainSc: 0-No pain      Patients Stated Pain Goal: 6 (60/02/98 4730)  Complications: No notable events documented.

## 2022-05-16 NOTE — Discharge Instructions (Signed)
You are being discharged to home.  Resume your previous diet.  Your physician has recommended a repeat colonoscopy in 10 years for screening purposes.  

## 2022-05-16 NOTE — H&P (Signed)
[name] [name] is an 71 y.o. female.   Chief Complaint: screening colonoscopy HPI: 71 year old female with past medical history of cardiomyopathy, diabetes, hyperlipidemia, GERD, asthma, osteoporosis, hypertension, coming for screening colonoscopy. The patient has never had a colonoscopy in the past.  The patient denies having any complaints such as melena, hematochezia, abdominal pain or distention, change in her bowel movement consistency or frequency, no changes in weight recently.  No family history of colorectal cancer.  Past Medical History:  Diagnosis Date   Acute sinusitis, unspecified    Allergy    Anemia    "quite a few times"   Anxiety state, unspecified    Arachnoiditis BILATERAL LEGS   DUE TO MULTIPLE BACK SURG.'S   Arthritis    Asthma    last flare up was 03/2017 lasted over a month   Blood transfusion    Cancer ([place])    skin cancers (in scalp)   Cardiomyopathy HX --06/2010   EF was 25% during acute illness (PHELONEPHRITIS) Repeat echo 12-06-10 60% showed normal EF.    Chronic back pain greater than 3 months duration    S/P BACK SURG'S   CSF leak    Diabetes mellitus without complication ([place])    dx 2013 type 2   Dyslipidemia    Dysphagia    some post op cerv fusion 2/14   Dysrhythmia    Essential hypertension, benign    Family history of adverse reaction to anesthesia    mother gets n/v   Family history of breast cancer 02/06/2022   Family history of liver cancer 02/06/2022   Family history of lung cancer 02/06/2022   Family history of ovarian cancer 02/06/2022   GERD (gastroesophageal reflux disease) AND HIATIAL HERNIA   CONTROLLED W/ NEXIUM   Headache(784.0)    History of chronic bronchitis    Hx of bladder infections    Hyperlipidemia    TAKE CHLOSTEROL MEDICATION,04/23/19   Hypertension    Neuromuscular disorder (H[facility])    numbness and tingling   Osteoporosis    Other malaise and fatigue    PONV (postoperative nausea and vomiting)    Shortness of breath     Spinal headache    Spinal stenosis, cervical region    Varicosities    venous   Weakness of both legs DUE TO ARACHNOIDITIS   OCCASIONAL USES CANE    Past Surgical History:  Procedure Laterality Date   ABDOMINAL HYSTERECTOMY  1987   W/ BSO   ANTERIOR CERVICAL DECOMP/DISCECTOMY FUSION N/A 08/09/2012   Procedure: ANTERIOR CERVICAL DECOMPRESSION/DISCECTOMY FUSION 1 LEVEL;  Surgeon: Floyce Stakes, MD;  Location: [facility];  Service: Neurosurgery;  Laterality: N/A;  Cervical four-five  Anterior cervical decompression/diskectomy/fusion   ANTERIOR FUSION CERVICAL SPINE  2007   C5 -6   APPENDECTOMY     APPLICATION OF INTRAOPERATIVE CT SCAN N/A 05/17/2017   Procedure: APPLICATION OF INTRAOPERATIVE CT SCAN;  Surgeon: Erline Levine, MD;  Location: [place];  Service: Neurosurgery;  Laterality: N/A;   BACK SURGERY     x 5   BREAST EXCISIONAL BIOPSY Bilateral    No scar seen    BREAST SURGERY     x 2 biopsies   CARPAL TUNNEL RELEASE  RIGHT - 2001  & DEC 2011 W/ BACK SURG.   CERVICAL DISC SURGERY  2005   C5 - 6   CHOLECYSTECTOMY  1994   DORSAL COMPARTMENT RELEASE Right 04/07/2013   Procedure: RIGHT WRIST STS RELEASE;  Surgeon: Schuyler Amor, MD;  Location: [place];  Service: Orthopedics;  Laterality: Right;   ENDOVENOUS ABLATION SAPHENOUS VEIN W/ LASER Right 05/29/2019   endovenous laser ablation right greater saphenous vein by Gae Gallop MD    EXPLORATION OF INCISION FOR CSF LEAK  DEC 2011  X2   POST [place] Right 04/07/2013   Procedure: RIGHT INDEX AND RIGHT LONG DISTAL INTERPHALANGEAL JOINT FUSIONS;  Surgeon: Schuyler Amor, MD;  Location: [place];  Service: Orthopedics;  Laterality: Right;   FINGER ARTHROPLASTY Right 04/07/2013   Procedure: RIGHT THUMB [place] ARTHROPLASTY;  Surgeon: Schuyler Amor, MD;  Location: [place];  Service: Orthopedics;  Laterality: Right;   GANGLION CYST EXCISION  Right 04/07/2013   Procedure: RIGHT WRIST MASS EXCISION;  Surgeon: Schuyler Amor, MD;  Location: [place];  Service: Orthopedics;  Laterality: Right;   KNEE ARTHROSCOPY  LEFT X3 (LAST ONE 2005)   KNEE ARTHROSCOPY  05/17/2011   Procedure: ARTHROSCOPY KNEE;  Surgeon: Bradley Ferris III;  Location: [place];  Service: Orthopedics;  Laterality: Right;  WITH MEDIAL MENISECTOMY AND removal of suprapatella fat lump   LEFT WRIST TENOSYNECTOMY W/ LEFT THUMB JOINT REPAIR  02-24-10   LUMBAR FUSION  11-30-10   L4 - 5   LUMBAR FUSION  2000   L2 - 4   LUMBAR LAMINECTOMY  DEC 2011   L4 - 5   POSTERIOR CERVICAL FUSION/FORAMINOTOMY N/A 05/17/2017   Procedure: Cervical five  to Thorastic one  Posterior cervical fusion with lateral mass screws/revision of prior instrumentation;  Surgeon: Erline Levine, MD;  Location: [place];  Service: Neurosurgery;  Laterality: N/A;  C5 to T1 Posterior cervical fusion with lateral mass screws/revision of prior instrumentation   TENDON REPAIR  JAN 2010   LEFT INDEX AND LONG FINGERS   THUMB SURGERY   04/07/2019   RIGHT THUMB   TUBAL LIGATION      Family History  Problem Relation Age of Onset   Hypertension Mother    Heart disease Mother        Mitral valve replacement.    Diabetes Mother    Liver cancer Father 49       [place]   Diabetes Brother    Lung cancer Brother 39       mets   Melanoma Brother 5       neck and back   Cancer Paternal Uncle        unknown type; dx after 60   Breast cancer Paternal Grandmother        dx 25s   Ovarian cancer Cousin        paternal cousin; d. 21s   Cancer Cousin        GYN; d. 38s   Colon cancer Neg Hx    Colon polyps Neg Hx    Esophageal cancer Neg Hx    Rectal cancer Neg Hx    Stomach cancer Neg Hx    Social History:  reports that she has never smoked. She has never used smokeless tobacco. She reports that she does not drink alcohol and does not use drugs.  Allergies:  Allergies   Allergen Reactions   Latex Itching and Rash   Morphine And Related Hives and Itching   Acetaminophen Nausea And Vomiting   Morphine Itching    Headaches    Aspirin Nausea And Vomiting    Can take coated asa   Biaxin [Clarithromycin] Nausea And Vomiting  Clarithromycin Diarrhea and Nausea And Vomiting   Codeine Itching and Nausea And Vomiting   Darvocet [Propoxyphene N-Acetaminophen] Itching and Nausea And Vomiting   Hydrocodone-Acetaminophen Itching and Nausea And Vomiting   Lortab [Hydrocodone-Acetaminophen] Itching and Nausea And Vomiting   Percocet [Oxycodone-Acetaminophen] Itching and Nausea And Vomiting   Tramadol Nausea And Vomiting    Medications Prior to Admission  Medication Sig Dispense Refill   albuterol (VENTOLIN HFA) 108 (90 Base) MCG/ACT inhaler INHALE 2 PUFFS BY MOUTH EVERY 6 HOURS AS NEEDED FOR WHEEZING OR SHORTNESS OF BREATH 8.5 g 4   aspirin EC 81 MG tablet Take 81 mg by mouth at bedtime.      Biotin 1000 MCG tablet Take 1,000 mcg by mouth daily.     budesonide-formoterol (SYMBICORT) 160-4.5 MCG/ACT inhaler Inhale 2 puffs into the lungs 2 (two) times daily. 1 Inhaler 5   Calcium Carbonate-Vit D-Min 1200-1000 MG-UNIT CHEW Chew 1 tablet by mouth daily.     Cyanocobalamin (VITAMIN B-12) 2500 MCG SUBL Place 2,500 mcg under the tongue daily.     diltiazem (CARDIZEM CD) 240 MG 24 hr capsule Take 240 mg by mouth daily.     gabapentin (NEURONTIN) 800 MG tablet Take 1 tablet (800 mg total) by mouth 2 (two) times daily. 60 tablet 3   losartan-hydrochlorothiazide (HYZAAR) 50-12.5 MG tablet Take 1 tablet by mouth daily.     metFORMIN (GLUCOPHAGE) 500 MG tablet Take 500 mg by mouth daily with breakfast.     montelukast (SINGULAIR) 10 MG tablet Take 10 mg by mouth daily.     naloxone (NARCAN) nasal spray 4 mg/0.1 mL Place 1 spray into the nose once as needed (Just in case of Opiod Overdose).     nortriptyline (PAMELOR) 25 MG capsule Take 1 capsule (25 mg total) by mouth at  bedtime. Three month supply 90 capsule 1   omeprazole (PRILOSEC) 40 MG capsule Take 40 mg by mouth daily.     oxyCODONE (ROXICODONE) 15 MG immediate release tablet Take 1 tablet (15 mg total) by mouth 5 (five) times daily as needed for pain. Do Not Fill Before 11/ 24/2023 150 tablet 0   polyethylene glycol-electrolytes (TRILYTE) 420 g solution Take 4,000 mLs by mouth as directed. 4000 mL 0   promethazine (PHENERGAN) 25 MG tablet Take 25 mg by mouth as needed.      simvastatin (ZOCOR) 10 MG tablet Take 10 mg by mouth every evening.     topiramate (TOPAMAX) 25 MG tablet Take 1 tablet (25 mg total) by mouth 2 (two) times daily. 180 tablet 3   valACYclovir (VALTREX) 500 MG tablet Take 500 mg by mouth daily.      albuterol (PROVENTIL) (2.5 MG/3ML) 0.083% nebulizer solution Take 2.5 mg by nebulization every 4 (four) hours as needed for wheezing or shortness of breath.     potassium chloride SA (KLOR-CON M) 20 MEQ tablet Take 2 tablets (40 mEq total) by mouth daily for 3 days. 6 tablet 0   TRUE METRIX BLOOD GLUCOSE TEST test strip daily.      Results for orders placed or performed during the hospital encounter of 05/16/22 (from the past 48 hour(s))  Glucose, capillary     Status: None   Collection Time: 05/16/22  9:40 AM  Result Value Ref Range   Glucose-Capillary 88 70 - 99 mg/dL    Comment: Glucose reference range applies only to samples taken after fasting for at least 8 hours.   No results found.  Review of Systems  All other systems reviewed and are negative.   Blood pressure (!) 131/59, pulse 73, temperature 97.9 F (36.6 C), temperature source Oral, resp. rate 16, SpO2 100 %. Physical Exam  GENERAL: The patient is AO x3, in no acute distress. HEENT: Head is normocephalic and atraumatic. EOMI are intact. Mouth is well hydrated and without lesions. NECK: Supple. No masses LUNGS: Clear to auscultation. No presence of rhonchi/wheezing/rales. Adequate chest expansion HEART: RRR, normal s1  and s2. ABDOMEN: Soft, nontender, no guarding, no peritoneal signs, and nondistended. BS +. No masses. EXTREMITIES: Without any cyanosis, clubbing, rash, lesions or edema. NEUROLOGIC: AOx3, no focal motor deficit. SKIN: no jaundice, no rashes  Assessment/Plan 71 year old female with past medical history of cardiomyopathy, diabetes, hyperlipidemia, GERD, asthma, osteoporosis, hypertension, coming for screening colonoscopy.  The patient is at average risk for colorectal cancer.  We will proceed with colonoscopy today.   Harvel Quale, MD 05/16/2022, 9:55 AM

## 2022-05-16 NOTE — Anesthesia Preprocedure Evaluation (Addendum)
Anesthesia Evaluation  Patient identified by MRN, date of birth, ID band Patient awake    Reviewed: Allergy & Precautions, H&P , NPO status , Patient's Chart, lab work & pertinent test results  History of Anesthesia Complications (+) PONV, POST - OP SPINAL HEADACHE, Family history of anesthesia reaction and history of anesthetic complications  Airway Mallampati: II  TM Distance: >3 FB Neck ROM: Limited   Comment: Cervical fusion Cervical post-laminectomy syndrome Cervical radicular pain Cervical stenosis of spinal canal   Dental  (+) Dental Advisory Given, Missing   Pulmonary shortness of breath and with exertion, asthma    Pulmonary exam normal breath sounds clear to auscultation       Cardiovascular Exercise Tolerance: Good hypertension, Pt. on medications Normal cardiovascular exam+ dysrhythmias + Valvular Problems/Murmurs MVP  Rhythm:Regular Rate:Normal     Neuro/Psych  Headaches PSYCHIATRIC DISORDERS Anxiety Depression     Neuromuscular disease    GI/Hepatic Neg liver ROS,GERD  Medicated and Controlled,,  Endo/Other  diabetes, Well Controlled, Type 2, Oral Hypoglycemic Agents    Renal/GU negative Renal ROS  negative genitourinary   Musculoskeletal  (+) Arthritis , Osteoarthritis,    Abdominal   Peds negative pediatric ROS (+)  Hematology  (+) Blood dyscrasia, anemia   Anesthesia Other Findings Cervical fusion Cervical post-laminectomy syndrome Cervical radicular pain Cervical stenosis of spinal canal    Reproductive/Obstetrics negative OB ROS                             Anesthesia Physical Anesthesia Plan  ASA: 3  Anesthesia Plan: General   Post-op Pain Management: Minimal or no pain anticipated   Induction: Intravenous  PONV Risk Score and Plan: Propofol infusion  Airway Management Planned: Nasal Cannula and Natural Airway  Additional Equipment:   Intra-op Plan:    Post-operative Plan:   Informed Consent: I have reviewed the patients History and Physical, chart, labs and discussed the procedure including the risks, benefits and alternatives for the proposed anesthesia with the patient or authorized representative who has indicated his/her understanding and acceptance.     Dental advisory given  Plan Discussed with: CRNA and Surgeon  Anesthesia Plan Comments:        Anesthesia Quick Evaluation

## 2022-05-17 ENCOUNTER — Encounter (INDEPENDENT_AMBULATORY_CARE_PROVIDER_SITE_OTHER): Payer: Self-pay | Admitting: *Deleted

## 2022-05-17 DIAGNOSIS — M546 Pain in thoracic spine: Secondary | ICD-10-CM | POA: Diagnosis not present

## 2022-05-17 DIAGNOSIS — S2232XA Fracture of one rib, left side, initial encounter for closed fracture: Secondary | ICD-10-CM | POA: Diagnosis not present

## 2022-05-17 DIAGNOSIS — Z6826 Body mass index (BMI) 26.0-26.9, adult: Secondary | ICD-10-CM | POA: Diagnosis not present

## 2022-05-19 ENCOUNTER — Encounter (HOSPITAL_COMMUNITY): Payer: Self-pay | Admitting: Gastroenterology

## 2022-06-06 DIAGNOSIS — E119 Type 2 diabetes mellitus without complications: Secondary | ICD-10-CM | POA: Diagnosis not present

## 2022-06-06 DIAGNOSIS — H02834 Dermatochalasis of left upper eyelid: Secondary | ICD-10-CM | POA: Diagnosis not present

## 2022-06-06 DIAGNOSIS — Z7984 Long term (current) use of oral hypoglycemic drugs: Secondary | ICD-10-CM | POA: Diagnosis not present

## 2022-06-06 DIAGNOSIS — H02831 Dermatochalasis of right upper eyelid: Secondary | ICD-10-CM | POA: Diagnosis not present

## 2022-06-06 DIAGNOSIS — Z961 Presence of intraocular lens: Secondary | ICD-10-CM | POA: Diagnosis not present

## 2022-06-08 ENCOUNTER — Encounter: Payer: Medicare Other | Attending: Physical Medicine & Rehabilitation | Admitting: Registered Nurse

## 2022-06-08 ENCOUNTER — Encounter: Payer: Self-pay | Admitting: Registered Nurse

## 2022-06-08 VITALS — BP 119/74 | HR 80 | Ht 65.0 in | Wt 161.0 lb

## 2022-06-08 DIAGNOSIS — M5412 Radiculopathy, cervical region: Secondary | ICD-10-CM | POA: Insufficient documentation

## 2022-06-08 DIAGNOSIS — G039 Meningitis, unspecified: Secondary | ICD-10-CM | POA: Diagnosis not present

## 2022-06-08 DIAGNOSIS — M5416 Radiculopathy, lumbar region: Secondary | ICD-10-CM | POA: Insufficient documentation

## 2022-06-08 DIAGNOSIS — Z79891 Long term (current) use of opiate analgesic: Secondary | ICD-10-CM | POA: Diagnosis not present

## 2022-06-08 DIAGNOSIS — G8929 Other chronic pain: Secondary | ICD-10-CM | POA: Insufficient documentation

## 2022-06-08 DIAGNOSIS — M542 Cervicalgia: Secondary | ICD-10-CM | POA: Insufficient documentation

## 2022-06-08 DIAGNOSIS — R519 Headache, unspecified: Secondary | ICD-10-CM | POA: Diagnosis not present

## 2022-06-08 DIAGNOSIS — M546 Pain in thoracic spine: Secondary | ICD-10-CM | POA: Insufficient documentation

## 2022-06-08 DIAGNOSIS — G894 Chronic pain syndrome: Secondary | ICD-10-CM | POA: Diagnosis not present

## 2022-06-08 DIAGNOSIS — Z5181 Encounter for therapeutic drug level monitoring: Secondary | ICD-10-CM | POA: Diagnosis not present

## 2022-06-08 MED ORDER — OXYCODONE HCL 15 MG PO TABS: 15.0000 mg | ORAL_TABLET | Freq: Every day | ORAL | 0 refills | Status: DC | PRN

## 2022-06-08 NOTE — Progress Notes (Unsigned)
Subjective:    Patient ID: [name] [name], female    DOB: [birth year], 71 y.o.   MRN: [id number]  HPI: [name] [name] is a 71 y.o. female who returns for follow up appointment for chronic pain and medication refill. She states her pain is located in her neck radiating into her bilateral shoulders, upper- lower back pain radiating into her bilateral lower extremities. She rates her pain 7. Her current exercise regime is walking and performing stretching exercises.  Ms. Romito Morphine equivalent is 112.50 MME.   Last UDS was Performed on 03/13/2022, it was consistent.    Pain Inventory Average Pain 7 Pain Right Now 7 My pain is constant, sharp, burning, stabbing, tingling, and aching  In the last 24 hours, has pain interfered with the following? General activity 7 Relation with others 7 Enjoyment of life 7 What TIME of day is your pain at its worst? morning , daytime, evening, night, and varies Sleep (in general) Fair  Pain is worse with: walking, bending, sitting, standing, and some activites Pain improves with: rest, pacing activities, medication, TENS, and heat Relief from Meds: 7  Family History  Problem Relation Age of Onset  . Hypertension Mother   . Heart disease Mother        Mitral valve replacement.   . Diabetes Mother   . Liver cancer Father 36       [place]  . Diabetes Brother   . Lung cancer Brother 71       mets  . Melanoma Brother 69       neck and back  . Cancer Paternal Uncle        unknown type; dx after 41  . Breast cancer Paternal Grandmother        dx 69s  . Ovarian cancer Cousin        paternal cousin; d. 27s  . Cancer Cousin        GYN; d. 24s  . Colon cancer Neg Hx   . Colon polyps Neg Hx   . Esophageal cancer Neg Hx   . Rectal cancer Neg Hx   . Stomach cancer Neg Hx    Social History   Socioeconomic History  . Marital status: Divorced    Spouse name: Not on file  . Number of children: Not on file  . Years of education: Not on file  .  Highest education level: Not on file  Occupational History  . Occupation: disable    Employer: DISABLED  Tobacco Use  . Smoking status: Never  . Smokeless tobacco: Never  Vaping Use  . Vaping Use: Never used  Substance and Sexual Activity  . Alcohol use: No  . Drug use: No  . Sexual activity: Not on file    Comment: second hand smoke  Other Topics Concern  . Not on file  Social History Narrative   Has one child lives at home with her.    Social Determinants of Health   Financial Resource Strain: Not on file  Food Insecurity: Not on file  Transportation Needs: Not on file  Physical Activity: Not on file  Stress: Not on file  Social Connections: Not on file   Past Surgical History:  Procedure Laterality Date  . ABDOMINAL HYSTERECTOMY  1987   W/ BSO  . ANTERIOR CERVICAL DECOMP/DISCECTOMY FUSION N/A 08/09/2012   Procedure: ANTERIOR CERVICAL DECOMPRESSION/DISCECTOMY FUSION 1 LEVEL;  Surgeon: Floyce Stakes, MD;  [facility];  Service: Neurosurgery;  Laterality: N/A;  Cervical four-five  Anterior cervical decompression/diskectomy/fusion  . ANTERIOR FUSION CERVICAL SPINE  2007   C5 -6  . APPENDECTOMY    . APPLICATION OF INTRAOPERATIVE CT SCAN N/A 05/17/2017   Procedure: APPLICATION OF INTRAOPERATIVE CT SCAN;  Surgeon: Erline Levine, MD;  Location: [place];  Service: Neurosurgery;  Laterality: N/A;  . BACK SURGERY     x 5  . BREAST EXCISIONAL BIOPSY Bilateral    No scar seen   . BREAST SURGERY     x 2 biopsies  . CARPAL TUNNEL RELEASE  RIGHT - 2001  & DEC 2011 W/ BACK SURG.  . CERVICAL DISC SURGERY  2005   C5 - 6  . CHOLECYSTECTOMY  1994  . COLONOSCOPY WITH PROPOFOL N/A 05/16/2022   Procedure: COLONOSCOPY WITH PROPOFOL;  Surgeon: Harvel Quale, MD;  [facility];  Service: Gastroenterology;  Laterality: N/A;  945 ASA 3  . DORSAL COMPARTMENT RELEASE Right 04/07/2013   Procedure: RIGHT WRIST STS RELEASE;  Surgeon: Schuyler Amor, MD;   Location: [place];  Service: Orthopedics;  Laterality: Right;  . ENDOVENOUS ABLATION SAPHENOUS VEIN W/ LASER Right 05/29/2019   endovenous laser ablation right greater saphenous vein by Gae Gallop MD   . [place] CSF LEAK  [place] 2011  X2   POST [place]  . FINGER ARTHRODESIS Right 04/07/2013   Procedure: RIGHT INDEX AND RIGHT LONG DISTAL INTERPHALANGEAL JOINT FUSIONS;  Surgeon: Schuyler Amor, MD;  Location: [place];  Service: Orthopedics;  Laterality: Right;  . FINGER ARTHROPLASTY Right 04/07/2013   Procedure: RIGHT THUMB [place] ARTHROPLASTY;  Surgeon: Schuyler Amor, MD;  Location: [place];  Service: Orthopedics;  Laterality: Right;  . GANGLION CYST EXCISION Right 04/07/2013   Procedure: RIGHT WRIST MASS EXCISION;  Surgeon: Schuyler Amor, MD;  Location: [place];  Service: Orthopedics;  Laterality: Right;  . KNEE ARTHROSCOPY  LEFT X3 (LAST ONE 2005)  . KNEE ARTHROSCOPY  05/17/2011   Procedure: ARTHROSCOPY KNEE;  Surgeon: Bradley Ferris III;  Location: [place];  Service: Orthopedics;  Laterality: Right;  WITH MEDIAL MENISECTOMY AND removal of suprapatella fat lump  . LEFT WRIST TENOSYNECTOMY W/ LEFT THUMB JOINT REPAIR  02-24-10  . LUMBAR FUSION  11-30-10   L4 - 5  . LUMBAR FUSION  2000   L2 - 4  . LUMBAR LAMINECTOMY  DEC 2011   L4 - 5  . POSTERIOR CERVICAL FUSION/FORAMINOTOMY N/A 05/17/2017   Procedure: Cervical five  to Thorastic one  Posterior cervical fusion with lateral mass screws/revision of prior instrumentation;  Surgeon: Erline Levine, MD;  Location: [place];  Service: Neurosurgery;  Laterality: N/A;  C5 to T1 Posterior cervical fusion with lateral mass screws/revision of prior instrumentation  . TENDON REPAIR  JAN 2010   LEFT INDEX AND LONG FINGERS  . THUMB SURGERY   04/07/2019   RIGHT THUMB  . TUBAL LIGATION     Past Surgical History:  Procedure Laterality  Date  . ABDOMINAL HYSTERECTOMY  1987   W/ BSO  . ANTERIOR CERVICAL DECOMP/DISCECTOMY FUSION N/A 08/09/2012   Procedure: ANTERIOR CERVICAL DECOMPRESSION/DISCECTOMY FUSION 1 LEVEL;  Surgeon: Floyce Stakes, MD;  [facility];  Service: Neurosurgery;  Laterality: N/A;  Cervical four-five  Anterior cervical decompression/diskectomy/fusion  . ANTERIOR FUSION CERVICAL SPINE  2007   C5 -6  . APPENDECTOMY    . APPLICATION OF INTRAOPERATIVE CT SCAN N/A 05/17/2017  Procedure: APPLICATION OF INTRAOPERATIVE CT SCAN;  Surgeon: Erline Levine, MD;  Location: [place];  Service: Neurosurgery;  Laterality: N/A;  . BACK SURGERY     x 5  . BREAST EXCISIONAL BIOPSY Bilateral    No scar seen   . BREAST SURGERY     x 2 biopsies  . CARPAL TUNNEL RELEASE  RIGHT - 2001  & DEC 2011 W/ BACK SURG.  . CERVICAL DISC SURGERY  2005   C5 - 6  . CHOLECYSTECTOMY  1994  . COLONOSCOPY WITH PROPOFOL N/A 05/16/2022   Procedure: COLONOSCOPY WITH PROPOFOL;  Surgeon: Harvel Quale, MD;  [facility];  Service: Gastroenterology;  Laterality: N/A;  945 ASA 3  . DORSAL COMPARTMENT RELEASE Right 04/07/2013   Procedure: RIGHT WRIST STS RELEASE;  Surgeon: Schuyler Amor, MD;  Location: [place];  Service: Orthopedics;  Laterality: Right;  . ENDOVENOUS ABLATION SAPHENOUS VEIN W/ LASER Right 05/29/2019   endovenous laser ablation right greater saphenous vein by Gae Gallop MD   . [place] CSF LEAK  [place] 2011  X2   POST [place]  . FINGER ARTHRODESIS Right 04/07/2013   Procedure: RIGHT INDEX AND RIGHT LONG DISTAL INTERPHALANGEAL JOINT FUSIONS;  Surgeon: Schuyler Amor, MD;  Location: [place];  Service: Orthopedics;  Laterality: Right;  . FINGER ARTHROPLASTY Right 04/07/2013   Procedure: RIGHT THUMB [place] ARTHROPLASTY;  Surgeon: Schuyler Amor, MD;  Location: [place];  Service: Orthopedics;  Laterality: Right;  .  GANGLION CYST EXCISION Right 04/07/2013   Procedure: RIGHT WRIST MASS EXCISION;  Surgeon: Schuyler Amor, MD;  Location: [place];  Service: Orthopedics;  Laterality: Right;  . KNEE ARTHROSCOPY  LEFT X3 (LAST ONE 2005)  . KNEE ARTHROSCOPY  05/17/2011   Procedure: ARTHROSCOPY KNEE;  Surgeon: Bradley Ferris III;  Location: [place];  Service: Orthopedics;  Laterality: Right;  WITH MEDIAL MENISECTOMY AND removal of suprapatella fat lump  . LEFT WRIST TENOSYNECTOMY W/ LEFT THUMB JOINT REPAIR  02-24-10  . LUMBAR FUSION  11-30-10   L4 - 5  . LUMBAR FUSION  2000   L2 - 4  . LUMBAR LAMINECTOMY  DEC 2011   L4 - 5  . POSTERIOR CERVICAL FUSION/FORAMINOTOMY N/A 05/17/2017   Procedure: Cervical five  to Thorastic one  Posterior cervical fusion with lateral mass screws/revision of prior instrumentation;  Surgeon: Erline Levine, MD;  Location: [place];  Service: Neurosurgery;  Laterality: N/A;  C5 to T1 Posterior cervical fusion with lateral mass screws/revision of prior instrumentation  . TENDON REPAIR  JAN 2010   LEFT INDEX AND LONG FINGERS  . THUMB SURGERY   04/07/2019   RIGHT THUMB  . TUBAL LIGATION     Past Medical History:  Diagnosis Date  . Acute sinusitis, unspecified   . Allergy   . Anemia    "quite a few times"  . Anxiety state, unspecified   . Arachnoiditis BILATERAL LEGS   DUE TO MULTIPLE BACK SURG.'S  . Arthritis   . Asthma    last flare up was 03/2017 lasted over a month  . Blood transfusion   . Cancer ([facility])    skin cancers (in scalp)  . Cardiomyopathy HX --06/2010   EF was 25% during acute illness (PHELONEPHRITIS) Repeat echo 12-06-10 60% showed normal EF.   [place] Kitchen Chronic back pain greater than 3 months duration    S/P BACK SURG'S  . CSF leak   .  Diabetes mellitus without complication ([place])    dx 2013 type 2  . Dyslipidemia   . Dysphagia    some post op cerv fusion 2/14  . Dysrhythmia   . Essential hypertension, benign   . Family history  of adverse reaction to anesthesia    mother gets n/v  . Family history of breast cancer 02/06/2022  . Family history of liver cancer 02/06/2022  . Family history of lung cancer 02/06/2022  . Family history of ovarian cancer 02/06/2022  . GERD (gastroesophageal reflux disease) AND HIATIAL HERNIA   CONTROLLED W/ NEXIUM  . Headache(784.0)   . History of chronic bronchitis   . Hx of bladder infections   . Hyperlipidemia    TAKE CHLOSTEROL MEDICATION,04/23/19  . Hypertension   . Neuromuscular disorder (HCC)    numbness and tingling  . Osteoporosis   . Other malaise and fatigue   . PONV (postoperative nausea and vomiting)   . Shortness of breath   . Spinal headache   . Spinal stenosis, cervical region   . Varicosities    venous  . Weakness of both legs DUE TO ARACHNOIDITIS   OCCASIONAL USES CANE   There were no vitals taken for this visit.  Opioid Risk Score:   Fall Risk Score:  `1  Depression screen PHQ 2/9     04/13/2022    1:35 PM 03/13/2022    1:14 PM 12/29/2021   12:59 PM 11/16/2021    2:15 PM 09/13/2021    1:39 PM 08/16/2021    1:04 PM 04/19/2021    1:34 PM  Depression screen PHQ 2/9  Decreased Interest 0 0 0 0 0 0 1  Down, Depressed, Hopeless 0 0 0 0 0 0 1  PHQ - 2 Score 0 0 0 0 0 0 2    Review of Systems  Eyes:  Negative for visual disturbance.  Musculoskeletal:  Positive for arthralgias, back pain, gait problem and neck pain.  All other systems reviewed and are negative.     Objective:   Physical Exam Vitals and nursing note reviewed.  Constitutional:      Appearance: Normal appearance.  Cardiovascular:     Rate and Rhythm: Normal rate and regular rhythm.     Pulses: Normal pulses.     Heart sounds: Normal heart sounds.  Pulmonary:     Effort: Pulmonary effort is normal.     Breath sounds: Normal breath sounds.  Musculoskeletal:     Cervical back: Normal range of motion and neck supple.     Comments: Normal Muscle Bulk and Muscle Testing Reveals:  Upper  Extremities: Full ROM and Muscle Strength 5/5 Bilateral AC Joint Tenderness  Thoracic Paraspinal Tenderness: T-7-T-9 Lumbar Paraspinal Tenderness: L-3-L-5 Lower Extremities: Full ROM and Muscle Strength 5/5 Arises from Table with Ease Narrow Based  Gait     Skin:    General: Skin is warm and dry.  Neurological:     Mental Status: She is alert and oriented to person, place, and time.  Psychiatric:        Mood and Affect: Mood normal.        Behavior: Behavior normal.         Assessment & Plan:  1. On 05/17/2017 :C5C6C7 T1 Posterior Cervical Fusion with lateral mass fixation with AIRO Imaging, revision of prior instrumentation. By Dr. Vertell Limber.  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 Continue current medication regimen with  Gabapentin 800 mg BID. [place]: Oxycodone '15mg'$  one tablet  5 times a day  as needed for pain # 150 tablets We will continue the opioid monitoring program, this consists of regular clinic visits, examinations, urine drug screen, pill counts as well as use of [place] Controlled Substance Reporting system. 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 Continue to Monitor. 11/202023 3. Anxiety/depression: PCP Following. Continue to monitor. 05/08/2022. 4. Muscle Spasms: Continue current medication regime with Flexeril. Continue to Monitor. 11/202023 5. Cervicalgia/ Cervical Radiculitis: Dr Vertell Limber Following. ,Continue current medication regime with Gabapentin:  S/P  C5C6C7 T1 Posterior Cervical Fusion with lateral mass fixation with AIRO Imaging, revision of prior instrumentation by Dr. Vertell Limber on 05/17/2017. 05/08/2022 7. Bilateral Thoracic Back Pain: Continue HEP as Tolerated and Continue current medication regimen. Continue to Monitor. 05/08/2022  8.  Left lower extremity DVT/ Phlebitis:  S/P endovenous laser ablation of left great saphenous vein on 05/09/2018 by Dr. Scot Dock:  Vascular  Following. 05/08/2022. 9. Insomnia: Continue Pamelor. Continue to Monitor. 05/08/2022.       F/U in 1 month

## 2022-06-15 DIAGNOSIS — Z0000 Encounter for general adult medical examination without abnormal findings: Secondary | ICD-10-CM | POA: Diagnosis not present

## 2022-06-15 DIAGNOSIS — E7849 Other hyperlipidemia: Secondary | ICD-10-CM | POA: Diagnosis not present

## 2022-06-15 DIAGNOSIS — Z1331 Encounter for screening for depression: Secondary | ICD-10-CM | POA: Diagnosis not present

## 2022-06-15 DIAGNOSIS — M818 Other osteoporosis without current pathological fracture: Secondary | ICD-10-CM | POA: Diagnosis not present

## 2022-06-15 DIAGNOSIS — L570 Actinic keratosis: Secondary | ICD-10-CM | POA: Diagnosis not present

## 2022-06-15 DIAGNOSIS — R5383 Other fatigue: Secondary | ICD-10-CM | POA: Diagnosis not present

## 2022-06-15 DIAGNOSIS — E1143 Type 2 diabetes mellitus with diabetic autonomic (poly)neuropathy: Secondary | ICD-10-CM | POA: Diagnosis not present

## 2022-06-15 DIAGNOSIS — I10 Essential (primary) hypertension: Secondary | ICD-10-CM | POA: Diagnosis not present

## 2022-06-28 ENCOUNTER — Ambulatory Visit (INDEPENDENT_AMBULATORY_CARE_PROVIDER_SITE_OTHER): Payer: Medicare Other

## 2022-06-28 ENCOUNTER — Encounter: Payer: Self-pay | Admitting: Family

## 2022-06-28 ENCOUNTER — Ambulatory Visit (INDEPENDENT_AMBULATORY_CARE_PROVIDER_SITE_OTHER): Payer: Medicare Other | Admitting: Family

## 2022-06-28 DIAGNOSIS — M25562 Pain in left knee: Secondary | ICD-10-CM

## 2022-06-28 MED ORDER — LIDOCAINE HCL 1 % IJ SOLN
5.0000 mL | INTRAMUSCULAR | Status: AC | PRN
  Administered 2022-06-28: 5 mL

## 2022-06-28 MED ORDER — METHYLPREDNISOLONE ACETATE 40 MG/ML IJ SUSP
40.0000 mg | INTRAMUSCULAR | Status: AC | PRN
  Administered 2022-06-28: 40 mg via INTRA_ARTICULAR

## 2022-06-28 NOTE — Progress Notes (Signed)
Office Visit Note   Patient: [name] [name]           Date of Birth: Dec 07, 1950           MRN: [id number] Visit Date: 06/28/2022              Requested by: Neale Burly, MD [place],  [place] [id number] PCP: Neale Burly, MD  Chief Complaint  Patient presents with   Left Knee - Pain      HPI: The patient is a 72 year old woman seen today complaining of medial left knee pain she states she was squatting down at the bathroom sink about 2 weeks ago and felt an immediate stabbing pain twinge of pain to the medial knee over the course the day this became more swollen and bruised she states this feels like prior episodes of a meniscal injury.  There are certain ways when she pivots that are more painful she also has significant pain with full extension.  She has used over-the-counter's as well as Voltaren gel without relief  Assessment & Plan: Visit Diagnoses:  1. Acute pain of left knee     Plan: Depo-Medrol injection today.  Patient tolerated well.  We will proceed with MRI to evaluate for probable meniscal injury of the left knee  Follow-Up Instructions: No follow-ups on file.   Left Knee Exam   Muscle Strength  The patient has normal left knee strength.  Tenderness  The patient is experiencing tenderness in the medial joint line and lateral joint line.  Range of Motion  The patient has normal left knee ROM.  Tests  Varus: negative Valgus: negative  Other  Effusion: no effusion present      Patient is alert, oriented, no adenopathy, well-dressed, normal affect, normal respiratory effort.   Imaging: No results found. No images are attached to the encounter.  Labs: Lab Results  Component Value Date   HGBA1C 5.8 (H) 04/19/2017   REPTSTATUS 08/11/2012 FINAL 08/10/2012   GRAMSTAIN  06/10/2010    ABUNDANT WBC PRESENT,BOTH PMN AND MONONUCLEAR NO ORGANISMS SEEN Performed at [facility]  06/10/2010    ABUNDANT WBC  PRESENT,BOTH PMN AND MONONUCLEAR NO ORGANISMS SEEN Performed at [facility]  06/10/2010    ABUNDANT WBC PRESENT,BOTH PMN AND MONONUCLEAR NO ORGANISMS SEEN   CULT NO GROWTH 08/10/2012   LABORGA SERRATIA MARCESCENS 06/10/2010     Lab Results  Component Value Date   ALBUMIN 3.9 01/21/2007    No results found for: "MG" No results found for: "VD25OH"  No results found for: "PREALBUMIN"    Latest Ref Rng & Units 05/10/2022    9:33 AM 08/30/2017   12:52 PM 05/17/2017    6:36 AM  CBC EXTENDED  WBC 4.0 - 10.5 K/uL 5.8  9.9  9.7   RBC 3.87 - 5.11 MIL/uL 3.91  3.89  3.55   Hemoglobin 12.0 - 15.0 g/dL 12.2  11.9  11.6   HCT 36.0 - 46.0 % 38.4  36.5  35.2   Platelets 150 - 400 K/uL 249  289.0  274   NEUT# 1.7 - 7.7 K/uL 3.4  6.3    Lymph# 0.7 - 4.0 K/uL 1.9  2.8       There is no height or weight on file to calculate BMI.  Orders:  Orders Placed This Encounter  Procedures   XR Knee 1-2 Views Left   No orders of the defined types  were placed in this encounter.    Procedures: Large Joint Inj: L knee on 06/28/2022 4:04 PM Indications: pain Details: 18 G 1.5 in needle, anteromedial approach Medications: 5 mL lidocaine 1 %; 40 mg methylPREDNISolone acetate 40 MG/ML Consent was given by the patient.      Clinical Data: No additional findings.  ROS:  All other systems negative, except as noted in the HPI. Review of Systems  Objective: Vital Signs: There were no vitals taken for this visit.  Specialty Comments:  No specialty comments available.  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   Aortic atherosclerosis ([facility]) 04/25/2018   Cervical stenosis of spinal canal 05/17/2017   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   At risk from passive smoking 04/18/2016   Moderate persistent asthma with acute exacerbation 04/18/2016   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   Chronic cough 11/06/2012   Lung nodule 09/24/2012   Mild persistent asthma, uncomplicated 42/70/6237   Lumbar post-laminectomy syndrome 03/22/2012   Depression with anxiety 09/29/2011   Cervical post-laminectomy syndrome 08/28/2011   Arachnoiditis 08/28/2011   Cervical radicular pain 08/28/2011   Anxiety 08/28/2011   Headache    Dyspnea 12/23/2010   Hypertension    Past Medical History:  Diagnosis Date   Acute sinusitis, unspecified    Allergy    Anemia    "quite a few times"   Anxiety state, unspecified    Arachnoiditis BILATERAL LEGS   DUE TO MULTIPLE BACK SURG.'S   Arthritis    Asthma    last flare up was 03/2017 lasted over a month   Blood transfusion    Cancer ([facility])    skin cancers (in scalp)   Cardiomyopathy HX --06/2010   EF was 25% during acute illness (PHELONEPHRITIS) Repeat echo 12-06-10 60% showed normal EF.    Chronic back pain greater than 3 months duration    S/P BACK SURG'S   CSF leak    Diabetes mellitus without complication ([place])    dx 2013 type 2   Dyslipidemia    Dysphagia    some post op cerv fusion 2/14   Dysrhythmia    Essential hypertension, benign    Family history of adverse reaction to anesthesia    mother gets n/v   Family history of breast cancer 02/06/2022   Family history of liver cancer 02/06/2022   Family history of lung cancer 02/06/2022   Family history of ovarian cancer 02/06/2022   GERD (gastroesophageal reflux disease) AND HIATIAL HERNIA   CONTROLLED W/ NEXIUM   Headache(784.0)    History of chronic bronchitis    Hx of bladder infections    Hyperlipidemia     TAKE CHLOSTEROL MEDICATION,04/23/19   Hypertension    Neuromuscular disorder (HCC)    numbness and tingling   Osteoporosis    Other malaise and fatigue    PONV (postoperative nausea and vomiting)    Shortness of breath    Spinal headache    Spinal stenosis, cervical region    Varicosities    venous   Weakness of both legs  DUE TO ARACHNOIDITIS   OCCASIONAL USES CANE    Family History  Problem Relation Age of Onset   Hypertension Mother    Heart disease Mother        Mitral valve replacement.    Diabetes Mother    Liver cancer Father 41       [place]   Diabetes Brother    Lung cancer Brother 2       mets   Melanoma Brother 12       neck and back   Cancer Paternal Uncle        unknown type; dx after 55   Breast cancer Paternal Grandmother        dx 68s   Ovarian cancer Cousin        paternal cousin; d. 69s   Cancer Cousin        GYN; d. 37s   Colon cancer Neg Hx    Colon polyps Neg Hx    Esophageal cancer Neg Hx    Rectal cancer Neg Hx    Stomach cancer Neg Hx     Past Surgical History:  Procedure Laterality Date   ABDOMINAL HYSTERECTOMY  1987   W/ BSO   ANTERIOR CERVICAL DECOMP/DISCECTOMY FUSION N/A 08/09/2012   Procedure: ANTERIOR CERVICAL DECOMPRESSION/DISCECTOMY FUSION 1 LEVEL;  Surgeon: Floyce Stakes, MD;  [facility];  Service: Neurosurgery;  Laterality: N/A;  Cervical four-five  Anterior cervical decompression/diskectomy/fusion   ANTERIOR FUSION CERVICAL SPINE  2007   C5 -6   APPENDECTOMY     APPLICATION OF INTRAOPERATIVE CT SCAN N/A 05/17/2017   Procedure: APPLICATION OF INTRAOPERATIVE CT SCAN;  Surgeon: Erline Levine, MD;  Location: [facility];  Service: Neurosurgery;  Laterality: N/A;   BACK SURGERY     x 5   BREAST EXCISIONAL BIOPSY Bilateral    No scar seen    BREAST SURGERY     x 2 biopsies   CARPAL TUNNEL RELEASE  RIGHT - 2001  & DEC 2011 W/ BACK SURG.   CERVICAL DISC SURGERY  2005   C5 - 6   CHOLECYSTECTOMY  1994   COLONOSCOPY WITH  PROPOFOL N/A 05/16/2022   Procedure: COLONOSCOPY WITH PROPOFOL;  Surgeon: Harvel Quale, MD;  [facility];  Service: Gastroenterology;  Laterality: N/A;  945 ASA 3   DORSAL COMPARTMENT RELEASE Right 04/07/2013   Procedure: RIGHT WRIST STS RELEASE;  Surgeon: Schuyler Amor, MD;  Location: [facility];  Service: Orthopedics;  Laterality: Right;   ENDOVENOUS ABLATION SAPHENOUS VEIN W/ LASER Right 05/29/2019   endovenous laser ablation right greater saphenous vein by Gae Gallop MD    EXPLORATION OF INCISION FOR CSF LEAK  DEC 2011  X2   POST [place] Right 04/07/2013   Procedure: RIGHT INDEX AND RIGHT LONG DISTAL INTERPHALANGEAL JOINT FUSIONS;  Surgeon: Schuyler Amor, MD;  Location: [facility];  Service: Orthopedics;  Laterality: Right;   FINGER ARTHROPLASTY Right 04/07/2013   Procedure: RIGHT THUMB [place] ARTHROPLASTY;  Surgeon: Schuyler Amor, MD;  Location: [facility];  Service: Orthopedics;  Laterality: Right;   GANGLION CYST EXCISION Right 04/07/2013   Procedure: RIGHT WRIST MASS EXCISION;  Surgeon: Schuyler Amor, MD;  Location: [facility];  Service: Orthopedics;  Laterality: Right;   KNEE ARTHROSCOPY  LEFT X3 (LAST ONE 2005)   KNEE ARTHROSCOPY  05/17/2011   Procedure: ARTHROSCOPY KNEE;  Surgeon: Karen Chafe Rendall III;  Location:  [facility] [place];  Service: Orthopedics;  Laterality: Right;  WITH MEDIAL MENISECTOMY AND removal of suprapatella fat lump   LEFT WRIST TENOSYNECTOMY W/ LEFT THUMB JOINT REPAIR  02-24-10   LUMBAR FUSION  11-30-10   L4 - 5   LUMBAR FUSION  2000   L2 - 4   LUMBAR LAMINECTOMY  DEC 2011   L4 - 5   POSTERIOR CERVICAL FUSION/FORAMINOTOMY N/A 05/17/2017   Procedure: Cervical five  to Thorastic one  Posterior cervical fusion with lateral mass screws/revision of prior instrumentation;  Surgeon: Erline Levine, MD;  Location: [facility];  Service:  Neurosurgery;  Laterality: N/A;  C5 to T1 Posterior cervical fusion with lateral mass screws/revision of prior instrumentation   TENDON REPAIR  JAN 2010   LEFT INDEX AND LONG FINGERS   THUMB SURGERY   04/07/2019   RIGHT THUMB   TUBAL LIGATION     Social History   Occupational History   Occupation: disable    Employer: DISABLED  Tobacco Use   Smoking status: Never   Smokeless tobacco: Never  Vaping Use   Vaping Use: Never used  Substance and Sexual Activity   Alcohol use: No   Drug use: No   Sexual activity: Not on file    Comment: second hand smoke

## 2022-07-10 ENCOUNTER — Encounter: Payer: Medicare Other | Attending: Physical Medicine & Rehabilitation | Admitting: Registered Nurse

## 2022-07-10 ENCOUNTER — Encounter: Payer: Self-pay | Admitting: Registered Nurse

## 2022-07-10 VITALS — BP 131/77 | HR 82 | Ht 65.0 in | Wt 160.0 lb

## 2022-07-10 DIAGNOSIS — Z79891 Long term (current) use of opiate analgesic: Secondary | ICD-10-CM | POA: Insufficient documentation

## 2022-07-10 DIAGNOSIS — R519 Headache, unspecified: Secondary | ICD-10-CM | POA: Insufficient documentation

## 2022-07-10 DIAGNOSIS — M542 Cervicalgia: Secondary | ICD-10-CM | POA: Insufficient documentation

## 2022-07-10 DIAGNOSIS — Z5181 Encounter for therapeutic drug level monitoring: Secondary | ICD-10-CM | POA: Insufficient documentation

## 2022-07-10 DIAGNOSIS — M5416 Radiculopathy, lumbar region: Secondary | ICD-10-CM | POA: Insufficient documentation

## 2022-07-10 DIAGNOSIS — M546 Pain in thoracic spine: Secondary | ICD-10-CM | POA: Diagnosis not present

## 2022-07-10 DIAGNOSIS — G039 Meningitis, unspecified: Secondary | ICD-10-CM | POA: Insufficient documentation

## 2022-07-10 DIAGNOSIS — M5412 Radiculopathy, cervical region: Secondary | ICD-10-CM | POA: Insufficient documentation

## 2022-07-10 DIAGNOSIS — M961 Postlaminectomy syndrome, not elsewhere classified: Secondary | ICD-10-CM | POA: Insufficient documentation

## 2022-07-10 DIAGNOSIS — G894 Chronic pain syndrome: Secondary | ICD-10-CM | POA: Insufficient documentation

## 2022-07-10 DIAGNOSIS — G8929 Other chronic pain: Secondary | ICD-10-CM | POA: Insufficient documentation

## 2022-07-10 MED ORDER — OXYCODONE HCL 15 MG PO TABS: 15.0000 mg | ORAL_TABLET | Freq: Every day | ORAL | 0 refills | Status: DC | PRN

## 2022-07-10 NOTE — Progress Notes (Signed)
Subjective:    Patient ID: [name] [name], female    DOB: 05-Apr-1951, 72 y.o.   MRN: [id number]  HPI: [name] is a 72 y.o. female who returns for follow up appointment for chronic pain and medication refill. She states her pain is located in  her neck radiating into her bilateral shoulders, right side rib pain and mid- lower back pain radiating into her bilateral lower extremities. She rates her pain 7. Her current exercise regime is walking and performing stretching exercises.  [name] Morphine equivalent is 112.50 MME.   Last UDS was Performed on 03/13/2022, it was consistent.    Pain Inventory Average Pain 7 Pain Right Now 7 My pain is sharp, burning, stabbing, tingling, and aching  In the last 24 hours, has pain interfered with the following? General activity 7 Relation with others 7 Enjoyment of life 7 What TIME of day is your pain at its worst? varies Sleep (in general) Fair  Pain is worse with: walking, bending, sitting, inactivity, standing, and some activites Pain improves with: rest, heat/ice, therapy/exercise, pacing activities, and medication Relief from Meds: 7  Family History  Problem Relation Age of Onset   Hypertension Mother    Heart disease Mother        Mitral valve replacement.    Diabetes Mother    Liver cancer Father 68       [employer]   Diabetes Brother    Lung cancer Brother 53       mets   Melanoma Brother 56       neck and back   Cancer Paternal Uncle        unknown type; dx after 80   Breast cancer Paternal Grandmother        dx 57s   Ovarian cancer Cousin        paternal cousin; d. 7s   Cancer Cousin        GYN; d. 16s   Colon cancer Neg Hx    Colon polyps Neg Hx    Esophageal cancer Neg Hx    Rectal cancer Neg Hx    Stomach cancer Neg Hx    Social History   Socioeconomic History   Marital status: Divorced    Spouse name: Not on file   Number of children: Not on file   Years of education: Not on file   Highest education level:  Not on file  Occupational History   Occupation: disable    Employer: DISABLED  Tobacco Use   Smoking status: Never   Smokeless tobacco: Never  Vaping Use   Vaping Use: Never used  Substance and Sexual Activity   Alcohol use: No   Drug use: No   Sexual activity: Not on file    Comment: second hand smoke  Other Topics Concern   Not on file  Social History Narrative   Has one child lives at home with her.    Social Determinants of Health   Financial Resource Strain: Not on file  Food Insecurity: Not on file  Transportation Needs: Not on file  Physical Activity: Not on file  Stress: Not on file  Social Connections: Not on file   Past Surgical History:  Procedure Laterality Date   ABDOMINAL HYSTERECTOMY  1987   W/ [place] DECOMP/DISCECTOMY FUSION N/A 08/09/2012   Procedure: ANTERIOR CERVICAL DECOMPRESSION/DISCECTOMY FUSION 1 LEVEL;  Surgeon: Floyce Stakes, MD;  Location: [facility];  Service: Neurosurgery;  Laterality: N/A;  Cervical four-five  Anterior cervical decompression/diskectomy/fusion   ANTERIOR FUSION CERVICAL SPINE  2007   C5 -6   APPENDECTOMY     APPLICATION OF INTRAOPERATIVE CT SCAN N/A 05/17/2017   Procedure: APPLICATION OF INTRAOPERATIVE CT SCAN;  Surgeon: Erline Levine, MD;  Location: [place];  Service: Neurosurgery;  Laterality: N/A;   BACK SURGERY     x 5   BREAST EXCISIONAL BIOPSY Bilateral    No scar seen    BREAST SURGERY     x 2 biopsies   CARPAL TUNNEL RELEASE  RIGHT - 2001  & DEC 2011 W/ BACK SURG.   CERVICAL DISC SURGERY  2005   C5 - 6   CHOLECYSTECTOMY  1994   COLONOSCOPY WITH PROPOFOL N/A 05/16/2022   Procedure: COLONOSCOPY WITH PROPOFOL;  Surgeon: Harvel Quale, MD;  [facility];  Service: Gastroenterology;  Laterality: N/A;  945 ASA 3   DORSAL COMPARTMENT RELEASE Right 04/07/2013   Procedure: RIGHT WRIST STS RELEASE;  Surgeon: Schuyler Amor, MD;  Location: [place];  Service:  Orthopedics;  Laterality: Right;   ENDOVENOUS ABLATION SAPHENOUS VEIN W/ LASER Right 05/29/2019   endovenous laser ablation right greater saphenous vein by Gae Gallop MD    EXPLORATION OF INCISION FOR CSF LEAK  DEC 2011  X2   POST [place] Right 04/07/2013   Procedure: RIGHT INDEX AND RIGHT LONG DISTAL INTERPHALANGEAL JOINT FUSIONS;  Surgeon: Schuyler Amor, MD;  Location: [place];  Service: Orthopedics;  Laterality: Right;   FINGER ARTHROPLASTY Right 04/07/2013   Procedure: RIGHT THUMB [place] ARTHROPLASTY;  Surgeon: Schuyler Amor, MD;  Location: [place];  Service: Orthopedics;  Laterality: Right;   GANGLION CYST EXCISION Right 04/07/2013   Procedure: RIGHT WRIST MASS EXCISION;  Surgeon: Schuyler Amor, MD;  Location: [place];  Service: Orthopedics;  Laterality: Right;   KNEE ARTHROSCOPY  LEFT X3 (LAST ONE 2005)   KNEE ARTHROSCOPY  05/17/2011   Procedure: ARTHROSCOPY KNEE;  Surgeon: Bradley Ferris III;  Location: [place];  Service: Orthopedics;  Laterality: Right;  WITH MEDIAL MENISECTOMY AND removal of suprapatella fat lump   LEFT WRIST TENOSYNECTOMY W/ LEFT THUMB JOINT REPAIR  02-24-10   LUMBAR FUSION  11-30-10   L4 - 5   LUMBAR FUSION  2000   L2 - 4   LUMBAR LAMINECTOMY  DEC 2011   L4 - 5   POSTERIOR CERVICAL FUSION/FORAMINOTOMY N/A 05/17/2017   Procedure: Cervical five  to Thorastic one  Posterior cervical fusion with lateral mass screws/revision of prior instrumentation;  Surgeon: Erline Levine, MD;  Location: [place];  Service: Neurosurgery;  Laterality: N/A;  C5 to T1 Posterior cervical fusion with lateral mass screws/revision of prior instrumentation   TENDON REPAIR  JAN 2010   LEFT INDEX AND LONG FINGERS   THUMB SURGERY   04/07/2019   RIGHT THUMB   TUBAL LIGATION     Past Surgical History:  Procedure Laterality Date   ABDOMINAL HYSTERECTOMY  1987   W/ [facility]   ANTERIOR  CERVICAL DECOMP/DISCECTOMY FUSION N/A 08/09/2012   Procedure: ANTERIOR CERVICAL DECOMPRESSION/DISCECTOMY FUSION 1 LEVEL;  Surgeon: Floyce Stakes, MD;  [facility];  Service: Neurosurgery;  Laterality: N/A;  Cervical four-five  Anterior cervical decompression/diskectomy/fusion   ANTERIOR FUSION CERVICAL SPINE  2007   C5 -6   APPENDECTOMY     APPLICATION OF INTRAOPERATIVE CT SCAN N/A 05/17/2017  Procedure: APPLICATION OF INTRAOPERATIVE CT SCAN;  Surgeon: Erline Levine, MD;  Location: [place];  Service: Neurosurgery;  Laterality: N/A;   BACK SURGERY     x 5   BREAST EXCISIONAL BIOPSY Bilateral    No scar seen    BREAST SURGERY     x 2 biopsies   CARPAL TUNNEL RELEASE  RIGHT - 2001  & DEC 2011 W/ BACK SURG.   CERVICAL DISC SURGERY  2005   C5 - 6   CHOLECYSTECTOMY  1994   COLONOSCOPY WITH PROPOFOL N/A 05/16/2022   Procedure: COLONOSCOPY WITH PROPOFOL;  Surgeon: Harvel Quale, MD;  [facility];  Service: Gastroenterology;  Laterality: N/A;  945 ASA 3   DORSAL COMPARTMENT RELEASE Right 04/07/2013   Procedure: RIGHT WRIST STS RELEASE;  Surgeon: Schuyler Amor, MD;  Location: [place];  Service: Orthopedics;  Laterality: Right;   ENDOVENOUS ABLATION SAPHENOUS VEIN W/ LASER Right 05/29/2019   endovenous laser ablation right greater saphenous vein by Gae Gallop MD    EXPLORATION OF INCISION FOR CSF LEAK  DEC 2011  X2   POST [place] Right 04/07/2013   Procedure: RIGHT INDEX AND RIGHT LONG DISTAL INTERPHALANGEAL JOINT FUSIONS;  Surgeon: Schuyler Amor, MD;  Location: [place];  Service: Orthopedics;  Laterality: Right;   FINGER ARTHROPLASTY Right 04/07/2013   Procedure: RIGHT THUMB [place] ARTHROPLASTY;  Surgeon: Schuyler Amor, MD;  Location: [place];  Service: Orthopedics;  Laterality: Right;   GANGLION CYST EXCISION Right 04/07/2013   Procedure: RIGHT WRIST MASS EXCISION;   Surgeon: Schuyler Amor, MD;  Location: [place];  Service: Orthopedics;  Laterality: Right;   KNEE ARTHROSCOPY  LEFT X3 (LAST ONE 2005)   KNEE ARTHROSCOPY  05/17/2011   Procedure: ARTHROSCOPY KNEE;  Surgeon: Bradley Ferris III;  Location: [place];  Service: Orthopedics;  Laterality: Right;  WITH MEDIAL MENISECTOMY AND removal of suprapatella fat lump   LEFT WRIST TENOSYNECTOMY W/ LEFT THUMB JOINT REPAIR  02-24-10   LUMBAR FUSION  11-30-10   L4 - 5   LUMBAR FUSION  2000   L2 - 4   LUMBAR LAMINECTOMY  DEC 2011   L4 - 5   POSTERIOR CERVICAL FUSION/FORAMINOTOMY N/A 05/17/2017   Procedure: Cervical five  to Thorastic one  Posterior cervical fusion with lateral mass screws/revision of prior instrumentation;  Surgeon: Erline Levine, MD;  Location: [place];  Service: Neurosurgery;  Laterality: N/A;  C5 to T1 Posterior cervical fusion with lateral mass screws/revision of prior instrumentation   TENDON REPAIR  JAN 2010   LEFT INDEX AND LONG FINGERS   THUMB SURGERY   04/07/2019   RIGHT THUMB   TUBAL LIGATION     Past Medical History:  Diagnosis Date   Acute sinusitis, unspecified    Allergy    Anemia    "quite a few times"   Anxiety state, unspecified    Arachnoiditis BILATERAL LEGS   DUE TO MULTIPLE BACK SURG.'S   Arthritis    Asthma    last flare up was 03/2017 lasted over a month   Blood transfusion    Cancer ([place])    skin cancers (in scalp)   Cardiomyopathy HX --06/2010   EF was 25% during acute illness (PHELONEPHRITIS) Repeat echo 12-06-10 60% showed normal EF.    Chronic back pain greater than 3 months duration    S/P BACK SURG'S   CSF leak  Diabetes mellitus without complication ([place])    dx 2013 type 2   Dyslipidemia    Dysphagia    some post op cerv fusion 2/14   Dysrhythmia    Essential hypertension, benign    Family history of adverse reaction to anesthesia    mother gets n/v   Family history of breast cancer 02/06/2022   Family  history of liver cancer 02/06/2022   Family history of lung cancer 02/06/2022   Family history of ovarian cancer 02/06/2022   GERD (gastroesophageal reflux disease) AND HIATIAL HERNIA   CONTROLLED W/ NEXIUM   Headache(784.0)    History of chronic bronchitis    Hx of bladder infections    Hyperlipidemia    TAKE CHLOSTEROL MEDICATION,04/23/19   Hypertension    Neuromuscular disorder (HCC)    numbness and tingling   Osteoporosis    Other malaise and fatigue    PONV (postoperative nausea and vomiting)    Shortness of breath    Spinal headache    Spinal stenosis, cervical region    Varicosities    venous   Weakness of both legs DUE TO ARACHNOIDITIS   OCCASIONAL USES CANE   There were no vitals taken for this visit.  Opioid Risk Score:   Fall Risk Score:  `1  Depression screen PHQ 2/9     06/08/2022    1:14 PM 04/13/2022    1:35 PM 03/13/2022    1:14 PM 12/29/2021   12:59 PM 11/16/2021    2:15 PM 09/13/2021    1:39 PM 08/16/2021    1:04 PM  Depression screen PHQ 2/9  Decreased Interest 0 0 0 0 0 0 0  Down, Depressed, Hopeless 0 0 0 0 0 0 0  PHQ - 2 Score 0 0 0 0 0 0 0    Review of Systems  Musculoskeletal:  Positive for back pain, gait problem and neck pain.       B/L shoulder pain  All other systems reviewed and are negative.     Objective:   Physical Exam Vitals and nursing note reviewed.  Constitutional:      Appearance: Normal appearance.  Cardiovascular:     Rate and Rhythm: Normal rate and regular rhythm.     Pulses: Normal pulses.     Heart sounds: Normal heart sounds.  Pulmonary:     Effort: Pulmonary effort is normal.     Breath sounds: Normal breath sounds.  Musculoskeletal:     Cervical back: Normal range of motion and neck supple.     Comments: Normal Muscle Bulk and Muscle Testing Reveals:  Upper Extremities: Full ROM and Muscle Strength 5/5 Bilateral AC Joint Tenderness Thoracic Hypersensitivity  Lumbar Paraspinal Tenderness: L-3-L-5 Lower  Extremities : Full ROM and Muscle Strength 5/5 Arises from Table with ease Narrow Based Gait     Skin:    General: Skin is warm and dry.  Neurological:     Mental Status: She is alert and oriented to person, place, and time.  Psychiatric:        Mood and Affect: Mood normal.        Behavior: Behavior normal.         Assessment & Plan:  1. On 05/17/2017 :C5C6C7 T1 Posterior Cervical Fusion with lateral mass fixation with AIRO Imaging, revision of prior instrumentation. By Dr. Vertell Limber.  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 Continue current medication regimen with  Gabapentin 800 mg BID. [place]: Oxycodone '15mg'$  one tablet 5  times a day  as needed for pain # 150 tablets We will continue the opioid monitoring program, this consists of regular clinic visits, examinations, urine drug screen, pill counts as well as use of [place] Controlled Substance Reporting system. 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 Continue to Monitor. 01/222024 3. Anxiety/depression: PCP Following. Continue to monitor. 07/10/2022. 4. Muscle Spasms: Continue current medication regime with Flexeril. Continue to Monitor. 07/10/2022 5. Cervicalgia/ Cervical Radiculitis: Dr Vertell Limber Following. ,Continue current medication regime with Gabapentin:  S/P  C5C6C7 T1 Posterior Cervical Fusion with lateral mass fixation with AIRO Imaging, revision of prior instrumentation by Dr. Vertell Limber on 05/17/2017. 07/10/2022 7. Bilateral Thoracic Back Pain: Continue HEP as Tolerated and Continue current medication regimen. Continue to Monitor. 07/10/2022  8. Left lower extremity DVT/ Phlebitis:  S/P endovenous laser ablation of left great saphenous vein on 05/09/2018 by Dr. Scot Dock:  Vascular  Following. 07/10/2022. 9.  Insomnia: Continue Pamelor. Continue to Monitor. 07/10/2022.    F/U in 1 month

## 2022-07-11 ENCOUNTER — Encounter: Payer: Self-pay | Admitting: Registered Nurse

## 2022-08-08 ENCOUNTER — Telehealth: Payer: Self-pay | Admitting: Physical Medicine & Rehabilitation

## 2022-08-08 ENCOUNTER — Encounter: Payer: Medicare Other | Admitting: Registered Nurse

## 2022-08-08 MED ORDER — OXYCODONE HCL 15 MG PO TABS: 15.0000 mg | ORAL_TABLET | Freq: Every day | ORAL | 0 refills | Status: DC | PRN

## 2022-08-08 NOTE — Telephone Encounter (Signed)
We had to reschedule patient with [name] [name], she is out all week.  Patient will need refill on oxycodone.

## 2022-08-08 NOTE — Telephone Encounter (Signed)
Oxycodone refilled.

## 2022-09-05 ENCOUNTER — Encounter: Payer: Medicare Other | Attending: Physical Medicine & Rehabilitation | Admitting: Registered Nurse

## 2022-09-05 ENCOUNTER — Encounter: Payer: Self-pay | Admitting: Registered Nurse

## 2022-09-05 VITALS — BP 132/83 | HR 95 | Ht 65.0 in | Wt 154.0 lb

## 2022-09-05 DIAGNOSIS — M542 Cervicalgia: Secondary | ICD-10-CM | POA: Diagnosis not present

## 2022-09-05 DIAGNOSIS — Z79891 Long term (current) use of opiate analgesic: Secondary | ICD-10-CM | POA: Diagnosis not present

## 2022-09-05 DIAGNOSIS — G894 Chronic pain syndrome: Secondary | ICD-10-CM | POA: Insufficient documentation

## 2022-09-05 DIAGNOSIS — M546 Pain in thoracic spine: Secondary | ICD-10-CM | POA: Insufficient documentation

## 2022-09-05 DIAGNOSIS — G8929 Other chronic pain: Secondary | ICD-10-CM | POA: Diagnosis not present

## 2022-09-05 DIAGNOSIS — M5416 Radiculopathy, lumbar region: Secondary | ICD-10-CM | POA: Diagnosis not present

## 2022-09-05 DIAGNOSIS — Z5181 Encounter for therapeutic drug level monitoring: Secondary | ICD-10-CM | POA: Insufficient documentation

## 2022-09-05 DIAGNOSIS — M5412 Radiculopathy, cervical region: Secondary | ICD-10-CM | POA: Diagnosis not present

## 2022-09-05 DIAGNOSIS — G039 Meningitis, unspecified: Secondary | ICD-10-CM | POA: Diagnosis not present

## 2022-09-05 MED ORDER — OXYCODONE HCL 15 MG PO TABS: 15.0000 mg | ORAL_TABLET | Freq: Every day | ORAL | 0 refills | Status: DC | PRN

## 2022-09-05 NOTE — Progress Notes (Signed)
Subjective:    Patient ID: [name] [name], female    DOB: 01-Jan-1951, 72 y.o.   MRN: [id number]  HPI: [name] [name] is a 72 y.o. female who returns for follow up appointment for chronic pain and medication refill. She states her pain is located in her nec radiating into her bilateral shoulders, upper- lower back pain radiating into her bilateral hips and bilateral lower extremities. She rates her pain 7. Her current exercise regime is walking and performing stretching exercises.  [name] [name] Morphine equivalent is 112.50 MME.   UDS ordered Today   Pain Inventory Average Pain 7 Pain Right Now 7 My pain is sharp, burning, stabbing, tingling, and aching  In the last 24 hours, has pain interfered with the following? General activity 7 Relation with others 7 Enjoyment of life 7 What TIME of day is your pain at its worst? morning , daytime, evening, and night Sleep (in general) Fair  Pain is worse with: walking, bending, sitting, standing, and some activites Pain improves with: rest, heat/ice, pacing activities, and medication Relief from Meds: 7  Family History  Problem Relation Age of Onset   Hypertension Mother    Heart disease Mother        Mitral valve replacement.    Diabetes Mother    Liver cancer Father 2       [place]   Diabetes Brother    Lung cancer Brother 88       mets   Melanoma Brother 48       neck and back   Cancer Paternal Uncle        unknown type; dx after 66   Breast cancer Paternal Grandmother        dx 13s   Ovarian cancer Cousin        paternal cousin; d. 36s   Cancer Cousin        GYN; d. 60s   Colon cancer Neg Hx    Colon polyps Neg Hx    Esophageal cancer Neg Hx    Rectal cancer Neg Hx    Stomach cancer Neg Hx    Social History   Socioeconomic History   Marital status: Divorced    Spouse name: Not on file   Number of children: Not on file   Years of education: Not on file   Highest education level: Not on file  Occupational History    Occupation: disable    Employer: DISABLED  Tobacco Use   Smoking status: Never   Smokeless tobacco: Never  Vaping Use   Vaping Use: Never used  Substance and Sexual Activity   Alcohol use: No   Drug use: No   Sexual activity: Not on file    Comment: second hand smoke  Other Topics Concern   Not on file  Social History Narrative   Has one child lives at home with her.    Social Determinants of Health   Financial Resource Strain: Not on file  Food Insecurity: Not on file  Transportation Needs: Not on file  Physical Activity: Not on file  Stress: Not on file  Social Connections: Not on file   Past Surgical History:  Procedure Laterality Date   ABDOMINAL HYSTERECTOMY  1987   W/ [place] DECOMP/DISCECTOMY FUSION N/A 08/09/2012   Procedure: ANTERIOR CERVICAL DECOMPRESSION/DISCECTOMY FUSION 1 LEVEL;  Surgeon: Floyce Stakes, MD;  [facility]: [facility];  Service: Neurosurgery;  Laterality: N/A;  Cervical four-five  Anterior cervical decompression/diskectomy/fusion  ANTERIOR FUSION CERVICAL SPINE  2007   C5 -6   APPENDECTOMY     APPLICATION OF INTRAOPERATIVE CT SCAN N/A 05/17/2017   Procedure: APPLICATION OF INTRAOPERATIVE CT SCAN;  Surgeon: Erline Levine, MD;  Location: [place];  Service: Neurosurgery;  Laterality: N/A;   BACK SURGERY     x 5   BREAST EXCISIONAL BIOPSY Bilateral    No scar seen    BREAST SURGERY     x 2 biopsies   CARPAL TUNNEL RELEASE  RIGHT - 2001  & DEC 2011 W/ BACK SURG.   CERVICAL DISC SURGERY  2005   C5 - 6   CHOLECYSTECTOMY  1994   COLONOSCOPY WITH PROPOFOL N/A 05/16/2022   Procedure: COLONOSCOPY WITH PROPOFOL;  Surgeon: Harvel Quale, MD;  [facility];  Service: Gastroenterology;  Laterality: N/A;  945 ASA 3   DORSAL COMPARTMENT RELEASE Right 04/07/2013   Procedure: RIGHT WRIST STS RELEASE;  Surgeon: Schuyler Amor, MD;  Location: [place];  Service: Orthopedics;  Laterality: Right;    ENDOVENOUS ABLATION SAPHENOUS VEIN W/ LASER Right 05/29/2019   endovenous laser ablation right greater saphenous vein by Gae Gallop MD    EXPLORATION OF INCISION FOR CSF LEAK  DEC 2011  X2   POST [place] Right 04/07/2013   Procedure: RIGHT INDEX AND RIGHT LONG DISTAL INTERPHALANGEAL JOINT FUSIONS;  Surgeon: Schuyler Amor, MD;  Location: [place];  Service: Orthopedics;  Laterality: Right;   FINGER ARTHROPLASTY Right 04/07/2013   Procedure: RIGHT THUMB [place] ARTHROPLASTY;  Surgeon: Schuyler Amor, MD;  Location: [place];  Service: Orthopedics;  Laterality: Right;   GANGLION CYST EXCISION Right 04/07/2013   Procedure: RIGHT WRIST MASS EXCISION;  Surgeon: Schuyler Amor, MD;  Location: [place];  Service: Orthopedics;  Laterality: Right;   KNEE ARTHROSCOPY  LEFT X3 (LAST ONE 2005)   KNEE ARTHROSCOPY  05/17/2011   Procedure: ARTHROSCOPY KNEE;  Surgeon: Bradley Ferris III;  Location: [place];  Service: Orthopedics;  Laterality: Right;  WITH MEDIAL MENISECTOMY AND removal of suprapatella fat lump   LEFT WRIST TENOSYNECTOMY W/ LEFT THUMB JOINT REPAIR  02-24-10   LUMBAR FUSION  11-30-10   L4 - 5   LUMBAR FUSION  2000   L2 - 4   LUMBAR LAMINECTOMY  DEC 2011   L4 - 5   POSTERIOR CERVICAL FUSION/FORAMINOTOMY N/A 05/17/2017   Procedure: Cervical five  to Thorastic one  Posterior cervical fusion with lateral mass screws/revision of prior instrumentation;  Surgeon: Erline Levine, MD;  Location: [place];  Service: Neurosurgery;  Laterality: N/A;  C5 to T1 Posterior cervical fusion with lateral mass screws/revision of prior instrumentation   TENDON REPAIR  JAN 2010   LEFT INDEX AND LONG FINGERS   THUMB SURGERY   04/07/2019   RIGHT THUMB   TUBAL LIGATION     Past Surgical History:  Procedure Laterality Date   ABDOMINAL HYSTERECTOMY  1987   W/ BSO   ANTERIOR CERVICAL DECOMP/DISCECTOMY FUSION N/A  08/09/2012   Procedure: ANTERIOR CERVICAL DECOMPRESSION/DISCECTOMY FUSION 1 LEVEL;  Surgeon: Floyce Stakes, MD;  [facility];  Service: Neurosurgery;  Laterality: N/A;  Cervical four-five  Anterior cervical decompression/diskectomy/fusion   ANTERIOR FUSION CERVICAL SPINE  2007   C5 -6   APPENDECTOMY     APPLICATION OF INTRAOPERATIVE CT SCAN N/A 05/17/2017   Procedure: APPLICATION OF INTRAOPERATIVE CT SCAN;  Surgeon:  Erline Levine, MD;  Location: [place];  Service: Neurosurgery;  Laterality: N/A;   BACK SURGERY     x 5   BREAST EXCISIONAL BIOPSY Bilateral    No scar seen    BREAST SURGERY     x 2 biopsies   CARPAL TUNNEL RELEASE  RIGHT - 2001  & DEC 2011 W/ BACK SURG.   CERVICAL DISC SURGERY  2005   C5 - 6   CHOLECYSTECTOMY  1994   COLONOSCOPY WITH PROPOFOL N/A 05/16/2022   Procedure: COLONOSCOPY WITH PROPOFOL;  Surgeon: Harvel Quale, MD;  [facility];  Service: Gastroenterology;  Laterality: N/A;  945 ASA 3   DORSAL COMPARTMENT RELEASE Right 04/07/2013   Procedure: RIGHT WRIST STS RELEASE;  Surgeon: Schuyler Amor, MD;  Location: [place];  Service: Orthopedics;  Laterality: Right;   ENDOVENOUS ABLATION SAPHENOUS VEIN W/ LASER Right 05/29/2019   endovenous laser ablation right greater saphenous vein by Gae Gallop MD    EXPLORATION OF INCISION FOR CSF LEAK  DEC 2011  X2   POST [place] Right 04/07/2013   Procedure: RIGHT INDEX AND RIGHT LONG DISTAL INTERPHALANGEAL JOINT FUSIONS;  Surgeon: Schuyler Amor, MD;  Location: [place];  Service: Orthopedics;  Laterality: Right;   FINGER ARTHROPLASTY Right 04/07/2013   Procedure: RIGHT THUMB [place] ARTHROPLASTY;  Surgeon: Schuyler Amor, MD;  Location: [place];  Service: Orthopedics;  Laterality: Right;   GANGLION CYST EXCISION Right 04/07/2013   Procedure: RIGHT WRIST MASS EXCISION;  Surgeon: Schuyler Amor, MD;   Location: [place];  Service: Orthopedics;  Laterality: Right;   KNEE ARTHROSCOPY  LEFT X3 (LAST ONE 2005)   KNEE ARTHROSCOPY  05/17/2011   Procedure: ARTHROSCOPY KNEE;  Surgeon: Bradley Ferris III;  Location: [place];  Service: Orthopedics;  Laterality: Right;  WITH MEDIAL MENISECTOMY AND removal of suprapatella fat lump   LEFT WRIST TENOSYNECTOMY W/ LEFT THUMB JOINT REPAIR  02-24-10   LUMBAR FUSION  11-30-10   L4 - 5   LUMBAR FUSION  2000   L2 - 4   LUMBAR LAMINECTOMY  DEC 2011   L4 - 5   POSTERIOR CERVICAL FUSION/FORAMINOTOMY N/A 05/17/2017   Procedure: Cervical five  to Thorastic one  Posterior cervical fusion with lateral mass screws/revision of prior instrumentation;  Surgeon: Erline Levine, MD;  Location: [place];  Service: Neurosurgery;  Laterality: N/A;  C5 to T1 Posterior cervical fusion with lateral mass screws/revision of prior instrumentation   TENDON REPAIR  JAN 2010   LEFT INDEX AND LONG FINGERS   THUMB SURGERY   04/07/2019   RIGHT THUMB   TUBAL LIGATION     Past Medical History:  Diagnosis Date   Acute sinusitis, unspecified    Allergy    Anemia    "quite a few times"   Anxiety state, unspecified    Arachnoiditis BILATERAL LEGS   DUE TO MULTIPLE BACK SURG.'S   Arthritis    Asthma    last flare up was 03/2017 lasted over a month   Blood transfusion    Cancer ([place])    skin cancers (in scalp)   Cardiomyopathy HX --06/2010   EF was 25% during acute illness (PHELONEPHRITIS) Repeat echo 12-06-10 60% showed normal EF.    Chronic back pain greater than 3 months duration    S/P BACK SURG'S   CSF leak    Diabetes mellitus without complication ([place])  dx 2013 type 2   Dyslipidemia    Dysphagia    some post op cerv fusion 2/14   Dysrhythmia    Essential hypertension, benign    Family history of adverse reaction to anesthesia    mother gets n/v   Family history of breast cancer 02/06/2022   Family history of liver cancer 02/06/2022    Family history of lung cancer 02/06/2022   Family history of ovarian cancer 02/06/2022   GERD (gastroesophageal reflux disease) AND HIATIAL HERNIA   CONTROLLED W/ NEXIUM   Headache(784.0)    History of chronic bronchitis    Hx of bladder infections    Hyperlipidemia    TAKE CHLOSTEROL MEDICATION,04/23/19   Hypertension    Neuromuscular disorder (HCC)    numbness and tingling   Osteoporosis    Other malaise and fatigue    PONV (postoperative nausea and vomiting)    Shortness of breath    Spinal headache    Spinal stenosis, cervical region    Varicosities    venous   Weakness of both legs DUE TO ARACHNOIDITIS   OCCASIONAL USES CANE   BP 132/83   Pulse 95   Ht 5\' 5"  (1.651 m)   Wt 154 lb (69.9 kg)   SpO2 94%   BMI 25.63 kg/m   Opioid Risk Score:   Fall Risk Score:  `1  Depression screen PHQ 2/9     09/05/2022    1:38 PM 07/10/2022    1:22 PM 06/08/2022    1:14 PM 04/13/2022    1:35 PM 03/13/2022    1:14 PM 12/29/2021   12:59 PM 11/16/2021    2:15 PM  Depression screen PHQ 2/9  Decreased Interest 0 0 0 0 0 0 0  Down, Depressed, Hopeless 0 0 0 0 0 0 0  PHQ - 2 Score 0 0 0 0 0 0 0     Review of Systems  Musculoskeletal:  Positive for back pain, gait problem and neck pain.       B/L shoulder pain   All other systems reviewed and are negative.     Objective:   Physical Exam Vitals and nursing note reviewed.  Constitutional:      Appearance: Normal appearance.  Neck:     Comments: Cervical Paraspinal Tenderness: C-5-C-6 Cardiovascular:     Rate and Rhythm: Normal rate and regular rhythm.     Pulses: Normal pulses.     Heart sounds: Normal heart sounds.  Pulmonary:     Effort: Pulmonary effort is normal.     Breath sounds: Normal breath sounds.  Musculoskeletal:     Cervical back: Normal range of motion and neck supple.     Comments: Normal Muscle Bulk and Muscle Testing Reveals:  Upper Extremities: Full ROM and Muscle Strength 5/5  Bilateral AC Joint  Tenderness  Thoracic Paraspinal Tenderness: T-7-T-9  Lumbar Paraspinal Tenderness: L-3-L-5 Lower Extremities: Full ROM and Muscle Strength 5/5 Arises from Table with ease Narrow Based  Gait     Skin:    General: Skin is warm and dry.  Neurological:     Mental Status: She is alert and oriented to person, place, and time.  Psychiatric:        Mood and Affect: Mood normal.        Behavior: Behavior normal.         Assessment & Plan:  1. On 05/17/2017 :C5C6C7 T1 Posterior Cervical Fusion with lateral mass fixation with AIR[facility] Imaging, revision of prior instrumentation.  By Dr. Vertell Limber.  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 Continue current medication regimen with  Gabapentin 800 mg BID. [place]: Oxycodone 15mg  one tablet 5 times a day  as needed for pain # 150 tablets We will continue the opioid monitoring program, this consists of regular clinic visits, examinations, urine drug screen, pill counts as well as use of [place] Controlled Substance Reporting system. 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 Continue to Monitor. 01/222024 3. Anxiety/depression: PCP Following. Continue to monitor. 09/05/2022. 4. Muscle Spasms: Continue current medication regime with Flexeril. Continue to Monitor. 09/05/2022 5. Cervicalgia/ Cervical Radiculitis: Dr Vertell Limber Following. ,Continue current medication regime with Gabapentin:  S/P  C5C6C7 T1 Posterior Cervical Fusion with lateral mass fixation with AIRO Imaging, revision of prior instrumentation by Dr. Vertell Limber on 05/17/2017. 09/05/2022 7. Bilateral Thoracic Back Pain: Continue HEP as Tolerated and Continue current medication regimen. Continue to Monitor. 09/05/2022  8. Left lower extremity DVT/ Phlebitis:  S/P endovenous laser  ablation of left great saphenous vein on 05/09/2018 by Dr. Scot Dock:  Vascular  Following. 09/05/2022. 9. Insomnia: Continue Pamelor. Continue to Monitor. 09/05/2022.     F/U in 1 month

## 2022-09-07 LAB — TOXASSURE SELECT,+ANTIDEPR,UR

## 2022-09-12 DIAGNOSIS — I10 Essential (primary) hypertension: Secondary | ICD-10-CM | POA: Diagnosis not present

## 2022-09-12 DIAGNOSIS — E7849 Other hyperlipidemia: Secondary | ICD-10-CM | POA: Diagnosis not present

## 2022-09-12 DIAGNOSIS — M818 Other osteoporosis without current pathological fracture: Secondary | ICD-10-CM | POA: Diagnosis not present

## 2022-09-12 DIAGNOSIS — E1143 Type 2 diabetes mellitus with diabetic autonomic (poly)neuropathy: Secondary | ICD-10-CM | POA: Diagnosis not present

## 2022-09-22 ENCOUNTER — Telehealth: Payer: Self-pay | Admitting: *Deleted

## 2022-09-22 NOTE — Telephone Encounter (Signed)
Urine drug screen for this encounter is consistent for prescribed medication 

## 2022-10-03 ENCOUNTER — Ambulatory Visit: Payer: Medicare Other | Admitting: Internal Medicine

## 2022-10-03 ENCOUNTER — Encounter: Payer: Medicare Other | Attending: Physical Medicine & Rehabilitation | Admitting: Registered Nurse

## 2022-10-03 ENCOUNTER — Encounter: Payer: Self-pay | Admitting: Registered Nurse

## 2022-10-03 VITALS — BP 139/79 | HR 78 | Ht 65.0 in | Wt 160.0 lb

## 2022-10-03 DIAGNOSIS — Z5181 Encounter for therapeutic drug level monitoring: Secondary | ICD-10-CM | POA: Diagnosis not present

## 2022-10-03 DIAGNOSIS — M546 Pain in thoracic spine: Secondary | ICD-10-CM | POA: Diagnosis not present

## 2022-10-03 DIAGNOSIS — M961 Postlaminectomy syndrome, not elsewhere classified: Secondary | ICD-10-CM | POA: Diagnosis not present

## 2022-10-03 DIAGNOSIS — M5412 Radiculopathy, cervical region: Secondary | ICD-10-CM | POA: Diagnosis not present

## 2022-10-03 DIAGNOSIS — Z79891 Long term (current) use of opiate analgesic: Secondary | ICD-10-CM | POA: Diagnosis not present

## 2022-10-03 DIAGNOSIS — G894 Chronic pain syndrome: Secondary | ICD-10-CM | POA: Insufficient documentation

## 2022-10-03 DIAGNOSIS — M542 Cervicalgia: Secondary | ICD-10-CM | POA: Insufficient documentation

## 2022-10-03 DIAGNOSIS — M5416 Radiculopathy, lumbar region: Secondary | ICD-10-CM | POA: Diagnosis not present

## 2022-10-03 DIAGNOSIS — G8929 Other chronic pain: Secondary | ICD-10-CM | POA: Insufficient documentation

## 2022-10-03 DIAGNOSIS — G039 Meningitis, unspecified: Secondary | ICD-10-CM | POA: Diagnosis not present

## 2022-10-03 MED ORDER — NORTRIPTYLINE HCL 25 MG PO CAPS: 25.0000 mg | ORAL_CAPSULE | Freq: Every day | ORAL | 1 refills | Status: DC

## 2022-10-03 MED ORDER — OXYCODONE HCL 15 MG PO TABS: 15.0000 mg | ORAL_TABLET | Freq: Every day | ORAL | 0 refills | Status: DC | PRN

## 2022-10-03 MED ORDER — GABAPENTIN 800 MG PO TABS: 800.0000 mg | ORAL_TABLET | Freq: Two times a day (BID) | ORAL | 3 refills | Status: DC

## 2022-10-03 NOTE — Progress Notes (Signed)
Subjective:    Patient ID: [name] [name], female    DOB: Feb 24, 1951, 72 y.o.   MRN: [id number]  HPI: [name] [name] is a 72 y.o. female who returns for follow up appointment for chronic pain and medication refill. She states her pain is located in her neck radiating into her bilateral shoulders and upper- lower back pain. She rates her pain 7. Her current exercise regime is walking and performing stretching exercises.  [name] Morphine equivalent is 112.50 MME.   Last UDS was Performed on 09/05/2022, it was consistent.   [name] states her mother passed away, emotional support given.    Pain Inventory Average Pain 7 Pain Right Now 7 My pain is sharp, burning, stabbing, tingling, and aching  In the last 24 hours, has pain interfered with the following? General activity 7 Relation with others 7 Enjoyment of life 7 What TIME of day is your pain at its worst? morning , daytime, evening, and night Sleep (in general) Fair  Pain is worse with: walking, bending, sitting, inactivity, standing, and some activites Pain improves with: rest, heat/ice, pacing activities, and medication Relief from Meds: 7  Family History  Problem Relation Age of Onset   Hypertension Mother    Heart disease Mother        Mitral valve replacement.    Diabetes Mother    Liver cancer Father 38       HCC   Diabetes Brother    Lung cancer Brother 77       mets   Melanoma Brother 53       neck and back   Cancer Paternal Uncle        unknown type; dx after 48   Breast cancer Paternal Grandmother        dx 57s   Ovarian cancer Cousin        paternal cousin; d. 30s   Cancer Cousin        GYN; d. 33s   Colon cancer Neg Hx    Colon polyps Neg Hx    Esophageal cancer Neg Hx    Rectal cancer Neg Hx    Stomach cancer Neg Hx    Social History   Socioeconomic History   Marital status: Divorced    Spouse name: Not on file   Number of children: Not on file   Years of education: Not on file   Highest  education level: Not on file  Occupational History   Occupation: disable    Employer: DISABLED  Tobacco Use   Smoking status: Never   Smokeless tobacco: Never  Vaping Use   Vaping Use: Never used  Substance and Sexual Activity   Alcohol use: No   Drug use: No   Sexual activity: Not on file    Comment: second hand smoke  Other Topics Concern   Not on file  Social History Narrative   Has one child lives at home with her.    Social Determinants of Health   Financial Resource Strain: Not on file  Food Insecurity: Not on file  Transportation Needs: Not on file  Physical Activity: Not on file  Stress: Not on file  Social Connections: Not on file   Past Surgical History:  Procedure Laterality Date   ABDOMINAL HYSTERECTOMY  1987   W/ BSO   ANTERIOR CERVICAL DECOMP/DISCECTOMY FUSION N/A 08/09/2012   Procedure: ANTERIOR CERVICAL DECOMPRESSION/DISCECTOMY FUSION 1 LEVEL;  Surgeon: Karn Cassis, MD;  Location: [facility];  Service: Neurosurgery;  Laterality: N/A;  Cervical four-five  Anterior cervical decompression/diskectomy/fusion   ANTERIOR FUSION CERVICAL SPINE  2007   C5 -6   APPENDECTOMY     APPLICATION OF INTRAOPERATIVE CT SCAN N/A 05/17/2017   Procedure: APPLICATION OF INTRAOPERATIVE CT SCAN;  Surgeon: Maeola Harman, MD;  Location: [facility];  Service: Neurosurgery;  Laterality: N/A;   BACK SURGERY     x 5   BREAST EXCISIONAL BIOPSY Bilateral    No scar seen    BREAST SURGERY     x 2 biopsies   CARPAL TUNNEL RELEASE  RIGHT - 2001  & DEC 2011 W/ BACK SURG.   CERVICAL DISC SURGERY  2005   C5 - 6   CHOLECYSTECTOMY  1994   COLONOSCOPY WITH PROPOFOL N/A 05/16/2022   Procedure: COLONOSCOPY WITH PROPOFOL;  Surgeon: Dolores Frame, MD;  [facility]: [facility];  Service: Gastroenterology;  Laterality: N/A;  945 ASA 3   DORSAL COMPARTMENT RELEASE Right 04/07/2013   Procedure: RIGHT WRIST STS RELEASE;  Surgeon: Marlowe Shores, MD;  Location: [facility];  Service: Orthopedics;  Laterality: Right;   ENDOVENOUS ABLATION SAPHENOUS VEIN W/ LASER Right 05/29/2019   endovenous laser ablation right greater saphenous vein by Cari Caraway MD    EXPLORATION OF INCISION FOR CSF LEAK  DEC 2011  X2   POST LAMINECOTMY   FINGER ARTHRODESIS Right 04/07/2013   Procedure: RIGHT INDEX AND RIGHT LONG DISTAL INTERPHALANGEAL JOINT FUSIONS;  Surgeon: Marlowe Shores, MD;  Location: [facility];  Service: Orthopedics;  Laterality: Right;   FINGER ARTHROPLASTY Right 04/07/2013   Procedure: RIGHT THUMB CMC ARTHROPLASTY;  Surgeon: Marlowe Shores, MD;  Location: [facility];  Service: Orthopedics;  Laterality: Right;   GANGLION CYST EXCISION Right 04/07/2013   Procedure: RIGHT WRIST MASS EXCISION;  Surgeon: Marlowe Shores, MD;  Location: [facility];  Service: Orthopedics;  Laterality: Right;   KNEE ARTHROSCOPY  LEFT X3 (LAST ONE 2005)   KNEE ARTHROSCOPY  05/17/2011   Procedure: ARTHROSCOPY KNEE;  Surgeon: Curlene Labrum III;  Location: [facility];  Service: Orthopedics;  Laterality: Right;  WITH MEDIAL MENISECTOMY AND removal of suprapatella fat lump   LEFT WRIST TENOSYNECTOMY W/ LEFT THUMB JOINT REPAIR  02-24-10   LUMBAR FUSION  11-30-10   L4 - 5   LUMBAR FUSION  2000   L2 - 4   LUMBAR LAMINECTOMY  DEC 2011   L4 - 5   POSTERIOR CERVICAL FUSION/FORAMINOTOMY N/A 05/17/2017   Procedure: Cervical five  to Thorastic one  Posterior cervical fusion with lateral mass screws/revision of prior instrumentation;  Surgeon: Maeola Harman, MD;  Location: [facility];  Service: Neurosurgery;  Laterality: N/A;  C5 to T1 Posterior cervical fusion with lateral mass screws/revision of prior instrumentation   TENDON REPAIR  JAN 2010   LEFT INDEX AND LONG FINGERS   THUMB SURGERY   04/07/2019   RIGHT THUMB   TUBAL LIGATION     Past Surgical History:  Procedure Laterality Date   ABDOMINAL HYSTERECTOMY  1987   W/  BSO   ANTERIOR CERVICAL DECOMP/DISCECTOMY FUSION N/A 08/09/2012   Procedure: ANTERIOR CERVICAL DECOMPRESSION/DISCECTOMY FUSION 1 LEVEL;  Surgeon: Karn Cassis, MD;  Location: [facility];  Service: Neurosurgery;  Laterality: N/A;  Cervical four-five  Anterior cervical decompression/diskectomy/fusion   ANTERIOR FUSION CERVICAL SPINE  2007   C5 -6   APPENDECTOMY     APPLICATION OF INTRAOPERATIVE  CT SCAN N/A 05/17/2017   Procedure: APPLICATION OF INTRAOPERATIVE CT SCAN;  Surgeon: Maeola Harman, MD;  Location: [facility] OR;  Service: Neurosurgery;  Laterality: N/A;   BACK SURGERY     x 5   BREAST EXCISIONAL BIOPSY Bilateral    No scar seen    BREAST SURGERY     x 2 biopsies   CARPAL TUNNEL RELEASE  RIGHT - 2001  & DEC 2011 W/ BACK SURG.   CERVICAL DISC SURGERY  2005   C5 - 6   CHOLECYSTECTOMY  1994   COLONOSCOPY WITH PROPOFOL N/A 05/16/2022   Procedure: COLONOSCOPY WITH PROPOFOL;  Surgeon: Dolores Frame, MD;  [facility];  Service: Gastroenterology;  Laterality: N/A;  945 ASA 3   DORSAL COMPARTMENT RELEASE Right 04/07/2013   Procedure: RIGHT WRIST STS RELEASE;  Surgeon: Marlowe Shores, MD;  Location: [facility];  Service: Orthopedics;  Laterality: Right;   ENDOVENOUS ABLATION SAPHENOUS VEIN W/ LASER Right 05/29/2019   endovenous laser ablation right greater saphenous vein by Cari Caraway MD    EXPLORATION OF INCISION FOR CSF LEAK  DEC 2011  X2   POST LAMINECOTMY   FINGER ARTHRODESIS Right 04/07/2013   Procedure: RIGHT INDEX AND RIGHT LONG DISTAL INTERPHALANGEAL JOINT FUSIONS;  Surgeon: Marlowe Shores, MD;  Location: [facility];  Service: Orthopedics;  Laterality: Right;   FINGER ARTHROPLASTY Right 04/07/2013   Procedure: RIGHT THUMB CMC ARTHROPLASTY;  Surgeon: Marlowe Shores, MD;  Location: [facility];  Service: Orthopedics;  Laterality: Right;   GANGLION CYST EXCISION Right 04/07/2013   Procedure: RIGHT WRIST  MASS EXCISION;  Surgeon: Marlowe Shores, MD;  Location: [facility];  Service: Orthopedics;  Laterality: Right;   KNEE ARTHROSCOPY  LEFT X3 (LAST ONE 2005)   KNEE ARTHROSCOPY  05/17/2011   Procedure: ARTHROSCOPY KNEE;  Surgeon: Curlene Labrum III;  Location: [facility];  Service: Orthopedics;  Laterality: Right;  WITH MEDIAL MENISECTOMY AND removal of suprapatella fat lump   LEFT WRIST TENOSYNECTOMY W/ LEFT THUMB JOINT REPAIR  02-24-10   LUMBAR FUSION  11-30-10   L4 - 5   LUMBAR FUSION  2000   L2 - 4   LUMBAR LAMINECTOMY  DEC 2011   L4 - 5   POSTERIOR CERVICAL FUSION/FORAMINOTOMY N/A 05/17/2017   Procedure: Cervical five  to Thorastic one  Posterior cervical fusion with lateral mass screws/revision of prior instrumentation;  Surgeon: Maeola Harman, MD;  Location: [facility] OR;  Service: Neurosurgery;  Laterality: N/A;  C5 to T1 Posterior cervical fusion with lateral mass screws/revision of prior instrumentation   TENDON REPAIR  JAN 2010   LEFT INDEX AND LONG FINGERS   THUMB SURGERY   04/07/2019   RIGHT THUMB   TUBAL LIGATION     Past Medical History:  Diagnosis Date   Acute sinusitis, unspecified    Allergy    Anemia    "quite a few times"   Anxiety state, unspecified    Arachnoiditis BILATERAL LEGS   DUE TO MULTIPLE BACK SURG.'S   Arthritis    Asthma    last flare up was 03/2017 lasted over a month   Blood transfusion    Cancer (HCC)    skin cancers (in scalp)   Cardiomyopathy HX --06/2010   EF was 25% during acute illness (PHELONEPHRITIS) Repeat echo 12-06-10 60% showed normal EF.    Chronic back pain greater than 3 months duration    S/P  BACK SURG'S   CSF leak    Diabetes mellitus without complication (HCC)    dx 2013 type 2   Dyslipidemia    Dysphagia    some post op cerv fusion 2/14   Dysrhythmia    Essential hypertension, benign    Family history of adverse reaction to anesthesia    mother gets n/v   Family history of breast cancer  02/06/2022   Family history of liver cancer 02/06/2022   Family history of lung cancer 02/06/2022   Family history of ovarian cancer 02/06/2022   GERD (gastroesophageal reflux disease) AND HIATIAL HERNIA   CONTROLLED W/ NEXIUM   Headache(784.0)    History of chronic bronchitis    Hx of bladder infections    Hyperlipidemia    TAKE CHLOSTEROL MEDICATION,04/23/19   Hypertension    Neuromuscular disorder (HCC)    numbness and tingling   Osteoporosis    Other malaise and fatigue    PONV (postoperative nausea and vomiting)    Shortness of breath    Spinal headache    Spinal stenosis, cervical region    Varicosities    venous   Weakness of both legs DUE TO ARACHNOIDITIS   OCCASIONAL USES CANE   There were no vitals taken for this visit.  Opioid Risk Score:  Fall Risk Score: 1  Depression screen PHQ 2/9     10/03/2022    1:11 PM 09/05/2022    1:38 PM 07/10/2022    1:22 PM 06/08/2022    1:14 PM 04/13/2022    1:35 PM 03/13/2022    1:14 PM 12/29/2021   12:59 PM  Depression screen PHQ 2/9  Decreased Interest 0 0 0 0 0 0 0  Down, Depressed, Hopeless 0 0 0 0 0 0 0  PHQ - 2 Score 0 0 0 0 0 0 0     Review of Systems  Constitutional: Negative.   HENT: Negative.    Eyes: Negative.   Respiratory: Negative.    Cardiovascular: Negative.   Gastrointestinal: Negative.   Endocrine: Negative.   Genitourinary: Negative.   Musculoskeletal:  Positive for back pain.  Skin: Negative.   Allergic/Immunologic: Negative.   Neurological: Negative.   Hematological: Negative.   Psychiatric/Behavioral: Negative.    All other systems reviewed and are negative.      Objective:   Physical Exam Vitals and nursing note reviewed.  Constitutional:      Appearance: Normal appearance.  Cardiovascular:     Rate and Rhythm: Normal rate and regular rhythm.     Pulses: Normal pulses.     Heart sounds: Normal heart sounds.  Pulmonary:     Effort: Pulmonary effort is normal.     Breath sounds: Normal  breath sounds.  Musculoskeletal:     Cervical back: Normal range of motion and neck supple.     Comments: Normal Muscle Bulk and Muscle Testing Reveals:  Upper Extremities: Full ROM and Muscle Strength 5/5  Thoracic and Lumbar Hypersensitivity  Lower Extremities: Full ROM and Muscle Strength 5/5 Arises from Table slowly Narrow Based  Gait     Skin:    General: Skin is warm and dry.  Neurological:     Mental Status: She is alert and oriented to person, place, and time.  Psychiatric:        Mood and Affect: Mood normal.        Behavior: Behavior normal.         Assessment & Plan:  1. On 05/17/2017 :C5C6C7 T1  Posterior Cervical Fusion with lateral mass fixation with AIRO Imaging, revision of prior instrumentation. By Dr. Venetia Maxon.  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 Continue current medication regimen with  Gabapentin 800 mg BID. Refilled: Oxycodone 15mg  one tablet 5 times a day  as needed for pain # 150 tablets We will continue the opioid monitoring program, this consists of regular clinic visits, examinations, urine drug screen, pill counts as well as use of [place] Controlled Substance Reporting system. 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 Continue to Monitor. 10/03/2022 3. Anxiety/depression: PCP Following. Continue to monitor. 10/03/2022. 4. Muscle Spasms: Continue current medication regime with Flexeril. Continue to Monitor. 10/03/2022 5. Cervicalgia/ Cervical Radiculitis: Dr Venetia Maxon Following. ,Continue current medication regime with Gabapentin:  S/P  C5C6C7 T1 Posterior Cervical Fusion with lateral mass fixation with AIRO Imaging, revision of prior instrumentation by Dr. Venetia Maxon on 05/17/2017. 10/03/2022 7. Bilateral Thoracic Back Pain: Continue HEP  as Tolerated and Continue current medication regimen. Continue to Monitor. 10/03/2022  8. Left lower extremity DVT/ Phlebitis:  S/P endovenous laser ablation of left great saphenous vein on 05/09/2018 by Dr. Edilia Bo:  Vascular  Following. 10/03/2022. 9. Insomnia: Continue Pamelor. Continue to Monitor. 10/03/2022.     F/U in 1 month

## 2022-11-03 ENCOUNTER — Encounter: Payer: Medicare Other | Attending: Physical Medicine & Rehabilitation | Admitting: Registered Nurse

## 2022-11-03 ENCOUNTER — Encounter: Payer: Self-pay | Admitting: Registered Nurse

## 2022-11-03 VITALS — BP 144/82 | HR 81 | Ht 65.0 in | Wt 159.2 lb

## 2022-11-03 DIAGNOSIS — Z79891 Long term (current) use of opiate analgesic: Secondary | ICD-10-CM

## 2022-11-03 DIAGNOSIS — G039 Meningitis, unspecified: Secondary | ICD-10-CM

## 2022-11-03 DIAGNOSIS — Z5181 Encounter for therapeutic drug level monitoring: Secondary | ICD-10-CM

## 2022-11-03 DIAGNOSIS — M542 Cervicalgia: Secondary | ICD-10-CM | POA: Diagnosis not present

## 2022-11-03 DIAGNOSIS — G894 Chronic pain syndrome: Secondary | ICD-10-CM | POA: Diagnosis not present

## 2022-11-03 DIAGNOSIS — M5416 Radiculopathy, lumbar region: Secondary | ICD-10-CM

## 2022-11-03 MED ORDER — OXYCODONE HCL 15 MG PO TABS: 15.0000 mg | ORAL_TABLET | Freq: Every day | ORAL | 0 refills | Status: DC | PRN

## 2022-11-03 NOTE — Progress Notes (Signed)
Subjective:    Patient ID: [name] [name], female    DOB: 06/28/1950, 72 y.o.   MRN: [id number]  HPI: [name] [name] is a 72 y.o. female who returns for follow up appointment for chronic pain and medication refill. She states her  pain is located in her neck, lower back pain radiating into her bilateral lower extremities. She rates her pain 7. Her current exercise regime is walking and performing stretching exercises.  [name] reports her brother is undergoing chemotherapy, and she is trying to assist him with his appointments. Emotional support given. She is aware we will try to accommodate her so she can assist her brother, she verbalizes understanding. .  [name] Morphine equivalent is 112.50 MME.   Last UDS was Performed on 09/05/2022, it was consistent.    Pain Inventory Average Pain 7 Pain Right Now 7 My pain is sharp, burning, stabbing, tingling, and aching  In the last 24 hours, has pain interfered with the following? General activity 7 Relation with others 7 Enjoyment of life 7 What TIME of day is your pain at its worst? morning , daytime, evening, and night Sleep (in general) Fair  Pain is worse with: walking, bending, sitting, standing, and some activites Pain improves with: rest, heat/ice, pacing activities, and medication Relief from Meds: 7  Family History  Problem Relation Age of Onset   Hypertension Mother    Heart disease Mother        Mitral valve replacement.    Diabetes Mother    Liver cancer Father 60       HCC   Diabetes Brother    Lung cancer Brother 67       mets   Melanoma Brother 48       neck and back   Cancer Paternal Uncle        unknown type; dx after 66   Breast cancer Paternal Grandmother        dx 30s   Ovarian cancer Cousin        paternal cousin; d. 30s   Cancer Cousin        GYN; d. 30s   Colon cancer Neg Hx    Colon polyps Neg Hx    Esophageal cancer Neg Hx    Rectal cancer Neg Hx    Stomach cancer Neg Hx    Social History    Socioeconomic History   Marital status: Divorced    Spouse name: Not on file   Number of children: Not on file   Years of education: Not on file   Highest education level: Not on file  Occupational History   Occupation: disable    Employer: DISABLED  Tobacco Use   Smoking status: Never   Smokeless tobacco: Never  Vaping Use   Vaping Use: Never used  Substance and Sexual Activity   Alcohol use: No   Drug use: No   Sexual activity: Not on file    Comment: second hand smoke  Other Topics Concern   Not on file  Social History Narrative   Has one child lives at home with her.    Social Determinants of Health   Financial Resource Strain: Not on file  Food Insecurity: Not on file  Transportation Needs: Not on file  Physical Activity: Not on file  Stress: Not on file  Social Connections: Not on file   Past Surgical History:  Procedure Laterality Date   ABDOMINAL HYSTERECTOMY  1987   W/ BSO  ANTERIOR CERVICAL DECOMP/DISCECTOMY FUSION N/A 08/09/2012   Procedure: ANTERIOR CERVICAL DECOMPRESSION/DISCECTOMY FUSION 1 LEVEL;  Surgeon: Karn Cassis, MD;  Location: [facility];  Service: Neurosurgery;  Laterality: N/A;  Cervical four-five  Anterior cervical decompression/diskectomy/fusion   ANTERIOR FUSION CERVICAL SPINE  2007   C5 -6   APPENDECTOMY     APPLICATION OF INTRAOPERATIVE CT SCAN N/A 05/17/2017   Procedure: APPLICATION OF INTRAOPERATIVE CT SCAN;  Surgeon: Maeola Harman, MD;  Location: [facility];  Service: Neurosurgery;  Laterality: N/A;   BACK SURGERY     x 5   BREAST EXCISIONAL BIOPSY Bilateral    No scar seen    BREAST SURGERY     x 2 biopsies   CARPAL TUNNEL RELEASE  RIGHT - 2001  & DEC 2011 W/ BACK SURG.   CERVICAL DISC SURGERY  2005   C5 - 6   CHOLECYSTECTOMY  1994   COLONOSCOPY WITH PROPOFOL N/A 05/16/2022   Procedure: COLONOSCOPY WITH PROPOFOL;  Surgeon: Dolores Frame, MD;  Location: [facility];  Service: Gastroenterology;  Laterality: N/A;   945 ASA 3   DORSAL COMPARTMENT RELEASE Right 04/07/2013   Procedure: RIGHT WRIST STS RELEASE;  Surgeon: Marlowe Shores, MD;  Location: [facility];  Service: Orthopedics;  Laterality: Right;   ENDOVENOUS ABLATION SAPHENOUS VEIN W/ LASER Right 05/29/2019   endovenous laser ablation right greater saphenous vein by Cari Caraway MD    EXPLORATION OF INCISION FOR CSF LEAK  DEC 2011  X2   POST LAMINECOTMY   FINGER ARTHRODESIS Right 04/07/2013   Procedure: RIGHT INDEX AND RIGHT LONG DISTAL INTERPHALANGEAL JOINT FUSIONS;  Surgeon: Marlowe Shores, MD;  Location: [facility];  Service: Orthopedics;  Laterality: Right;   FINGER ARTHROPLASTY Right 04/07/2013   Procedure: RIGHT THUMB CMC ARTHROPLASTY;  Surgeon: Marlowe Shores, MD;  Location:  SURGERY CENTER;  Service: Orthopedics;  Laterality: Right;   GANGLION CYST EXCISION Right 04/07/2013   Procedure: RIGHT WRIST MASS EXCISION;  Surgeon: Marlowe Shores, MD;  Location:  SURGERY CENTER;  Service: Orthopedics;  Laterality: Right;   KNEE ARTHROSCOPY  LEFT X3 (LAST ONE 2005)   KNEE ARTHROSCOPY  05/17/2011   Procedure: ARTHROSCOPY KNEE;  Surgeon: Curlene Labrum III;  Location: [facility];  Service: Orthopedics;  Laterality: Right;  WITH MEDIAL MENISECTOMY AND removal of suprapatella fat lump   LEFT WRIST TENOSYNECTOMY W/ LEFT THUMB JOINT REPAIR  02-24-10   LUMBAR FUSION  11-30-10   L4 - 5   LUMBAR FUSION  2000   L2 - 4   LUMBAR LAMINECTOMY  DEC 2011   L4 - 5   POSTERIOR CERVICAL FUSION/FORAMINOTOMY N/A 05/17/2017   Procedure: Cervical five  to Thorastic one  Posterior cervical fusion with lateral mass screws/revision of prior instrumentation;  Surgeon: Maeola Harman, MD;  Location: [facility] OR;  Service: Neurosurgery;  Laterality: N/A;  C5 to T1 Posterior cervical fusion with lateral mass screws/revision of prior instrumentation   TENDON REPAIR  JAN 2010   LEFT INDEX AND LONG  FINGERS   THUMB SURGERY   04/07/2019   RIGHT THUMB   TUBAL LIGATION     Past Surgical History:  Procedure Laterality Date   ABDOMINAL HYSTERECTOMY  1987   W/ BSO   ANTERIOR CERVICAL DECOMP/DISCECTOMY FUSION N/A 08/09/2012   Procedure: ANTERIOR CERVICAL DECOMPRESSION/DISCECTOMY FUSION 1 LEVEL;  Surgeon: Karn Cassis, MD;  Location: [facility];  Service: Neurosurgery;  Laterality: N/A;  Cervical  four-five  Anterior cervical decompression/diskectomy/fusion   ANTERIOR FUSION CERVICAL SPINE  2007   C5 -6   APPENDECTOMY     APPLICATION OF INTRAOPERATIVE CT SCAN N/A 05/17/2017   Procedure: APPLICATION OF INTRAOPERATIVE CT SCAN;  Surgeon: Maeola Harman, MD;  Location: [facility] OR;  Service: Neurosurgery;  Laterality: N/A;   BACK SURGERY     x 5   BREAST EXCISIONAL BIOPSY Bilateral    No scar seen    BREAST SURGERY     x 2 biopsies   CARPAL TUNNEL RELEASE  RIGHT - 2001  & DEC 2011 W/ BACK SURG.   CERVICAL DISC SURGERY  2005   C5 - 6   CHOLECYSTECTOMY  1994   COLONOSCOPY WITH PROPOFOL N/A 05/16/2022   Procedure: COLONOSCOPY WITH PROPOFOL;  Surgeon: Dolores Frame, MD;  [facility];  Service: Gastroenterology;  Laterality: N/A;  945 ASA 3   DORSAL COMPARTMENT RELEASE Right 04/07/2013   Procedure: RIGHT WRIST STS RELEASE;  Surgeon: Marlowe Shores, MD;  Location: [facility];  Service: Orthopedics;  Laterality: Right;   ENDOVENOUS ABLATION SAPHENOUS VEIN W/ LASER Right 05/29/2019   endovenous laser ablation right greater saphenous vein by Cari Caraway MD    EXPLORATION OF INCISION FOR CSF LEAK  DEC 2011  X2   POST LAMINECOTMY   FINGER ARTHRODESIS Right 04/07/2013   Procedure: RIGHT INDEX AND RIGHT LONG DISTAL INTERPHALANGEAL JOINT FUSIONS;  Surgeon: Marlowe Shores, MD;  Location: [facility];  Service: Orthopedics;  Laterality: Right;   FINGER ARTHROPLASTY Right 04/07/2013   Procedure: RIGHT THUMB CMC ARTHROPLASTY;  Surgeon: Marlowe Shores, MD;  Location: [facility];  Service: Orthopedics;  Laterality: Right;   GANGLION CYST EXCISION Right 04/07/2013   Procedure: RIGHT WRIST MASS EXCISION;  Surgeon: Marlowe Shores, MD;  Location: [facility];  Service: Orthopedics;  Laterality: Right;   KNEE ARTHROSCOPY  LEFT X3 (LAST ONE 2005)   KNEE ARTHROSCOPY  05/17/2011   Procedure: ARTHROSCOPY KNEE;  Surgeon: Curlene Labrum III;  Location: [facility];  Service: Orthopedics;  Laterality: Right;  WITH MEDIAL MENISECTOMY AND removal of suprapatella fat lump   LEFT WRIST TENOSYNECTOMY W/ LEFT THUMB JOINT REPAIR  02-24-10   LUMBAR FUSION  11-30-10   L4 - 5   LUMBAR FUSION  2000   L2 - 4   LUMBAR LAMINECTOMY  DEC 2011   L4 - 5   POSTERIOR CERVICAL FUSION/FORAMINOTOMY N/A 05/17/2017   Procedure: Cervical five  to Thorastic one  Posterior cervical fusion with lateral mass screws/revision of prior instrumentation;  Surgeon: Maeola Harman, MD;  Location: [facility] OR;  Service: Neurosurgery;  Laterality: N/A;  C5 to T1 Posterior cervical fusion with lateral mass screws/revision of prior instrumentation   TENDON REPAIR  JAN 2010   LEFT INDEX AND LONG FINGERS   THUMB SURGERY   04/07/2019   RIGHT THUMB   TUBAL LIGATION     Past Medical History:  Diagnosis Date   Acute sinusitis, unspecified    Allergy    Anemia    "quite a few times"   Anxiety state, unspecified    Arachnoiditis BILATERAL LEGS   DUE TO MULTIPLE BACK SURG.'S   Arthritis    Asthma    last flare up was 03/2017 lasted over a month   Blood transfusion    Cancer (HCC)    skin cancers (in scalp)   Cardiomyopathy HX --06/2010   EF was  25% during acute illness (PHELONEPHRITIS) Repeat echo 12-06-10 60% showed normal EF.    Chronic back pain greater than 3 months duration    S/P BACK SURG'S   CSF leak    Diabetes mellitus without complication (HCC)    dx 2013 type 2   Dyslipidemia    Dysphagia    some post op cerv fusion  2/14   Dysrhythmia    Essential hypertension, benign    Family history of adverse reaction to anesthesia    mother gets n/v   Family history of breast cancer 02/06/2022   Family history of liver cancer 02/06/2022   Family history of lung cancer 02/06/2022   Family history of ovarian cancer 02/06/2022   GERD (gastroesophageal reflux disease) AND HIATIAL HERNIA   CONTROLLED W/ NEXIUM   Headache(784.0)    History of chronic bronchitis    Hx of bladder infections    Hyperlipidemia    TAKE CHLOSTEROL MEDICATION,04/23/19   Hypertension    Neuromuscular disorder (HCC)    numbness and tingling   Osteoporosis    Other malaise and fatigue    PONV (postoperative nausea and vomiting)    Shortness of breath    Spinal headache    Spinal stenosis, cervical region    Varicosities    venous   Weakness of both legs DUE TO ARACHNOIDITIS   OCCASIONAL USES CANE   BP (!) 153/87   Pulse 87   Ht 5\' 5"  (1.651 m)   Wt 159 lb 3.2 oz (72.2 kg)   SpO2 97%   BMI 26.49 kg/m   Opioid Risk Score:   Fall Risk Score:  `1  Depression screen PHQ 2/9     11/03/2022    1:19 PM 10/03/2022    1:11 PM 09/05/2022    1:38 PM 07/10/2022    1:22 PM 06/08/2022    1:14 PM 04/13/2022    1:35 PM 03/13/2022    1:14 PM  Depression screen PHQ 2/9  Decreased Interest 0 0 0 0 0 0 0  Down, Depressed, Hopeless 0 0 0 0 0 0 0  PHQ - 2 Score 0 0 0 0 0 0 0    Review of Systems  Constitutional: Negative.   HENT: Negative.    Eyes: Negative.   Respiratory: Negative.    Cardiovascular: Negative.   Gastrointestinal: Negative.   Endocrine: Negative.   Genitourinary: Negative.   Musculoskeletal:  Positive for arthralgias and back pain.  Skin: Negative.   Allergic/Immunologic: Negative.   Neurological: Negative.   Hematological: Negative.   Psychiatric/Behavioral: Negative.    All other systems reviewed and are negative.      Objective:   Physical Exam Vitals and nursing note reviewed.  Constitutional:       Appearance: Normal appearance.  Neck:     Comments: Cervical Paraspinal Tenderness: C-5-C-6  Cardiovascular:     Rate and Rhythm: Normal rate and regular rhythm.     Pulses: Normal pulses.     Heart sounds: Normal heart sounds.  Pulmonary:     Effort: Pulmonary effort is normal.     Breath sounds: Normal breath sounds.  Musculoskeletal:     Cervical back: Normal range of motion and neck supple.     Comments: Normal Muscle Bulk and Muscle Testing Reveals:  Upper Extremities: Full ROM and Muscle Strength 5/5  Lumbar Hypersensitivity Lower Extremities: Full ROM and Muscle Strength 5/5 Arises from Table with Ease  Narrow Based  Gait     Skin:  General: Skin is warm and dry.  Neurological:     Mental Status: She is alert and oriented to person, place, and time.  Psychiatric:        Mood and Affect: Mood normal.        Behavior: Behavior normal.         Assessment & Plan:  1. On 05/17/2017 :C5C6C7 T1 Posterior Cervical Fusion with lateral mass fixation with [facility] [facility], revision of prior instrumentation. By Dr. Venetia Maxon.  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 Continue current medication regimen with  Gabapentin 800 mg BID. Refilled: Oxycodone 15mg  one tablet 5 times a day  as needed for pain # 150 tablets We will continue the opioid monitoring program, this consists of regular clinic visits, examinations, urine drug screen, pill counts as well as use of [place] Controlled Substance Reporting system. 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 Continue to Monitor. 11/03/2022 3. Anxiety/depression: PCP Following. Continue to monitor. 11/03/2022. 4. Muscle Spasms: Continue current medication regime with Flexeril. Continue to Monitor. 11/03/2022 5.  Cervicalgia/ Cervical Radiculitis: Dr Venetia Maxon Following. ,Continue current medication regime with Gabapentin:  S/P  C5C6C7 T1 Posterior Cervical Fusion with lateral mass fixation with AIRO Imaging, revision of prior instrumentation by Dr. Venetia Maxon on 05/17/2017. 11/03/2022 7. Bilateral Thoracic Back Pain: Continue HEP as Tolerated and Continue current medication regimen. Continue to Monitor. 11/03/2022  8. Left lower extremity DVT/ Phlebitis:  S/P endovenous laser ablation of left great saphenous vein on 05/09/2018 by Dr. Edilia Bo:  Vascular  Following. 11/03/2022. 9. Insomnia: Continue Pamelor. Continue to Monitor. 11/03/2022.     F/U in 1 month

## 2022-12-04 ENCOUNTER — Encounter: Payer: Self-pay | Admitting: Registered Nurse

## 2022-12-04 ENCOUNTER — Encounter: Payer: Medicare Other | Attending: Physical Medicine & Rehabilitation | Admitting: Registered Nurse

## 2022-12-04 VITALS — BP 136/81 | HR 77 | Ht 65.0 in | Wt 157.0 lb

## 2022-12-04 DIAGNOSIS — M546 Pain in thoracic spine: Secondary | ICD-10-CM | POA: Insufficient documentation

## 2022-12-04 DIAGNOSIS — M25511 Pain in right shoulder: Secondary | ICD-10-CM | POA: Insufficient documentation

## 2022-12-04 DIAGNOSIS — M5416 Radiculopathy, lumbar region: Secondary | ICD-10-CM | POA: Insufficient documentation

## 2022-12-04 DIAGNOSIS — Z5181 Encounter for therapeutic drug level monitoring: Secondary | ICD-10-CM | POA: Insufficient documentation

## 2022-12-04 DIAGNOSIS — Z79891 Long term (current) use of opiate analgesic: Secondary | ICD-10-CM | POA: Insufficient documentation

## 2022-12-04 DIAGNOSIS — M25512 Pain in left shoulder: Secondary | ICD-10-CM | POA: Insufficient documentation

## 2022-12-04 DIAGNOSIS — G8929 Other chronic pain: Secondary | ICD-10-CM | POA: Insufficient documentation

## 2022-12-04 DIAGNOSIS — G894 Chronic pain syndrome: Secondary | ICD-10-CM | POA: Insufficient documentation

## 2022-12-04 DIAGNOSIS — G039 Meningitis, unspecified: Secondary | ICD-10-CM | POA: Insufficient documentation

## 2022-12-04 DIAGNOSIS — M542 Cervicalgia: Secondary | ICD-10-CM | POA: Insufficient documentation

## 2022-12-04 MED ORDER — OXYCODONE HCL 15 MG PO TABS: 15.0000 mg | ORAL_TABLET | Freq: Every day | ORAL | 0 refills | Status: DC | PRN

## 2022-12-04 NOTE — Progress Notes (Unsigned)
Subjective:    Patient ID: [name] [name], female    DOB: Oct 23, 1950, 72 y.o.   MRN: [id number]  HPI: [name] [name] is a 72 y.o. female who returns for follow up appointment for chronic pain and medication refill. She states her pain is located in her neck, bilateral shoulders, mid- lower back pain radiating into her bilateral lower extremities . She rates her pain 7. Her current exercise regime is walking and performing stretching exercises.  [name] Morphine equivalent is 112.50 MME.   Last UDS was Performed on 09/05/2022, it was consistent.   [name] states her brother passed away last month, emotional support given.     Pain Inventory Average Pain 7 Pain Right Now 7 My pain is sharp, burning, stabbing, tingling, and aching  In the last 24 hours, has pain interfered with the following? General activity 7 Relation with others 7 Enjoyment of life 7 What TIME of day is your pain at its worst? morning , daytime, evening, and night Sleep (in general) Fair  Pain is worse with: walking, bending, sitting, standing, and some activites Pain improves with: rest, heat/ice, pacing activities, and medication Relief from Meds: 7  Family History  Problem Relation Age of Onset   Hypertension Mother    Heart disease Mother        Mitral valve replacement.    Diabetes Mother    Liver cancer Father 84       HCC   Diabetes Brother    Lung cancer Brother 69       mets   Melanoma Brother 56       neck and back   Cancer Paternal Uncle        unknown type; dx after 25   Breast cancer Paternal Grandmother        dx 54s   Ovarian cancer Cousin        paternal cousin; d. 30s   Cancer Cousin        GYN; d. 82s   Colon cancer Neg Hx    Colon polyps Neg Hx    Esophageal cancer Neg Hx    Rectal cancer Neg Hx    Stomach cancer Neg Hx    Social History   Socioeconomic History   Marital status: Divorced    Spouse name: Not on file   Number of children: Not on file   Years of  education: Not on file   Highest education level: Not on file  Occupational History   Occupation: disable    Employer: DISABLED  Tobacco Use   Smoking status: Never   Smokeless tobacco: Never  Vaping Use   Vaping Use: Never used  Substance and Sexual Activity   Alcohol use: No   Drug use: No   Sexual activity: Not on file    Comment: second hand smoke  Other Topics Concern   Not on file  Social History Narrative   Has one child lives at home with her.    Social Determinants of Health   Financial Resource Strain: Not on file  Food Insecurity: Not on file  Transportation Needs: Not on file  Physical Activity: Not on file  Stress: Not on file  Social Connections: Not on file   Past Surgical History:  Procedure Laterality Date   ABDOMINAL HYSTERECTOMY  1987   W/ BSO   ANTERIOR CERVICAL DECOMP/DISCECTOMY FUSION N/A 08/09/2012   Procedure: ANTERIOR CERVICAL DECOMPRESSION/DISCECTOMY FUSION 1 LEVEL;  Surgeon: Karn Cassis, MD;  Location: [facility];  Service: Neurosurgery;  Laterality: N/A;  Cervical four-five  Anterior cervical decompression/diskectomy/fusion   ANTERIOR FUSION CERVICAL SPINE  2007   C5 -6   APPENDECTOMY     APPLICATION OF INTRAOPERATIVE CT SCAN N/A 05/17/2017   Procedure: APPLICATION OF INTRAOPERATIVE CT SCAN;  Surgeon: Maeola Harman, MD;  Location: [facility] OR;  Service: Neurosurgery;  Laterality: N/A;   BACK SURGERY     x 5   BREAST EXCISIONAL BIOPSY Bilateral    No scar seen    BREAST SURGERY     x 2 biopsies   CARPAL TUNNEL RELEASE  RIGHT - 2001  & DEC 2011 W/ BACK SURG.   CERVICAL DISC SURGERY  2005   C5 - 6   CHOLECYSTECTOMY  1994   COLONOSCOPY WITH PROPOFOL N/A 05/16/2022   Procedure: COLONOSCOPY WITH PROPOFOL;  Surgeon: Dolores Frame, MD;  [facility];  Service: Gastroenterology;  Laterality: N/A;  945 ASA 3   DORSAL COMPARTMENT RELEASE Right 04/07/2013   Procedure: RIGHT WRIST STS RELEASE;  Surgeon: Marlowe Shores, MD;   Location: [facility];  Service: Orthopedics;  Laterality: Right;   ENDOVENOUS ABLATION SAPHENOUS VEIN W/ LASER Right 05/29/2019   endovenous laser ablation right greater saphenous vein by Cari Caraway MD    EXPLORATION OF INCISION FOR CSF LEAK  DEC 2011  X2   POST LAMINECOTMY   FINGER ARTHRODESIS Right 04/07/2013   Procedure: RIGHT INDEX AND RIGHT LONG DISTAL INTERPHALANGEAL JOINT FUSIONS;  Surgeon: Marlowe Shores, MD;  Location: [facility];  Service: Orthopedics;  Laterality: Right;   FINGER ARTHROPLASTY Right 04/07/2013   Procedure: RIGHT THUMB CMC ARTHROPLASTY;  Surgeon: Marlowe Shores, MD;  Location: [facility];  Service: Orthopedics;  Laterality: Right;   GANGLION CYST EXCISION Right 04/07/2013   Procedure: RIGHT WRIST MASS EXCISION;  Surgeon: Marlowe Shores, MD;  Location:  SURGERY CENTER;  Service: Orthopedics;  Laterality: Right;   KNEE ARTHROSCOPY  LEFT X3 (LAST ONE 2005)   KNEE ARTHROSCOPY  05/17/2011   Procedure: ARTHROSCOPY KNEE;  Surgeon: Curlene Labrum III;  Location: [facility];  Service: Orthopedics;  Laterality: Right;  WITH MEDIAL MENISECTOMY AND removal of suprapatella fat lump   LEFT WRIST TENOSYNECTOMY W/ LEFT THUMB JOINT REPAIR  02-24-10   LUMBAR FUSION  11-30-10   L4 - 5   LUMBAR FUSION  2000   L2 - 4   LUMBAR LAMINECTOMY  DEC 2011   L4 - 5   POSTERIOR CERVICAL FUSION/FORAMINOTOMY N/A 05/17/2017   Procedure: Cervical five  to Thorastic one  Posterior cervical fusion with lateral mass screws/revision of prior instrumentation;  Surgeon: Maeola Harman, MD;  Location: [facility] OR;  Service: Neurosurgery;  Laterality: N/A;  C5 to T1 Posterior cervical fusion with lateral mass screws/revision of prior instrumentation   TENDON REPAIR  JAN 2010   LEFT INDEX AND LONG FINGERS   THUMB SURGERY   04/07/2019   RIGHT THUMB   TUBAL LIGATION     Past Surgical History:  Procedure Laterality Date    ABDOMINAL HYSTERECTOMY  1987   W/ BSO   ANTERIOR CERVICAL DECOMP/DISCECTOMY FUSION N/A 08/09/2012   Procedure: ANTERIOR CERVICAL DECOMPRESSION/DISCECTOMY FUSION 1 LEVEL;  Surgeon: Karn Cassis, MD;  Location: [facility];  Service: Neurosurgery;  Laterality: N/A;  Cervical four-five  Anterior cervical decompression/diskectomy/fusion   ANTERIOR FUSION CERVICAL SPINE  2007   C5 -6   APPENDECTOMY  APPLICATION OF INTRAOPERATIVE CT SCAN N/A 05/17/2017   Procedure: APPLICATION OF INTRAOPERATIVE CT SCAN;  Surgeon: Maeola Harman, MD;  Location: [facility];  Service: Neurosurgery;  Laterality: N/A;   BACK SURGERY     x 5   BREAST EXCISIONAL BIOPSY Bilateral    No scar seen    BREAST SURGERY     x 2 biopsies   CARPAL TUNNEL RELEASE  RIGHT - 2001  & DEC 2011 W/ BACK SURG.   CERVICAL DISC SURGERY  2005   C5 - 6   CHOLECYSTECTOMY  1994   COLONOSCOPY WITH PROPOFOL N/A 05/16/2022   Procedure: COLONOSCOPY WITH PROPOFOL;  Surgeon: Dolores Frame, MD;  [facility];  Service: Gastroenterology;  Laterality: N/A;  945 ASA 3   DORSAL COMPARTMENT RELEASE Right 04/07/2013   Procedure: RIGHT WRIST STS RELEASE;  Surgeon: Marlowe Shores, MD;  Location: [facility];  Service: Orthopedics;  Laterality: Right;   ENDOVENOUS ABLATION SAPHENOUS VEIN W/ LASER Right 05/29/2019   endovenous laser ablation right greater saphenous vein by Cari Caraway MD    EXPLORATION OF INCISION FOR CSF LEAK  DEC 2011  X2   POST LAMINECOTMY   FINGER ARTHRODESIS Right 04/07/2013   Procedure: RIGHT INDEX AND RIGHT LONG DISTAL INTERPHALANGEAL JOINT FUSIONS;  Surgeon: Marlowe Shores, MD;  Location: [facility];  Service: Orthopedics;  Laterality: Right;   FINGER ARTHROPLASTY Right 04/07/2013   Procedure: RIGHT THUMB CMC ARTHROPLASTY;  Surgeon: Marlowe Shores, MD;  Location: [facility];  Service: Orthopedics;  Laterality: Right;   GANGLION CYST EXCISION Right  04/07/2013   Procedure: RIGHT WRIST MASS EXCISION;  Surgeon: Marlowe Shores, MD;  Location:  SURGERY CENTER;  Service: Orthopedics;  Laterality: Right;   KNEE ARTHROSCOPY  LEFT X3 (LAST ONE 2005)   KNEE ARTHROSCOPY  05/17/2011   Procedure: ARTHROSCOPY KNEE;  Surgeon: Curlene Labrum III;  Location: [facility];  Service: Orthopedics;  Laterality: Right;  WITH MEDIAL MENISECTOMY AND removal of suprapatella fat lump   LEFT WRIST TENOSYNECTOMY W/ LEFT THUMB JOINT REPAIR  02-24-10   LUMBAR FUSION  11-30-10   L4 - 5   LUMBAR FUSION  2000   L2 - 4   LUMBAR LAMINECTOMY  DEC 2011   L4 - 5   POSTERIOR CERVICAL FUSION/FORAMINOTOMY N/A 05/17/2017   Procedure: Cervical five  to Thorastic one  Posterior cervical fusion with lateral mass screws/revision of prior instrumentation;  Surgeon: Maeola Harman, MD;  Location: [facility] OR;  Service: Neurosurgery;  Laterality: N/A;  C5 to T1 Posterior cervical fusion with lateral mass screws/revision of prior instrumentation   TENDON REPAIR  JAN 2010   LEFT INDEX AND LONG FINGERS   THUMB SURGERY   04/07/2019   RIGHT THUMB   TUBAL LIGATION     Past Medical History:  Diagnosis Date   Acute sinusitis, unspecified    Allergy    Anemia    "quite a few times"   Anxiety state, unspecified    Arachnoiditis BILATERAL LEGS   DUE TO MULTIPLE BACK SURG.'S   Arthritis    Asthma    last flare up was 03/2017 lasted over a month   Blood transfusion    Cancer (HCC)    skin cancers (in scalp)   Cardiomyopathy HX --06/2010   EF was 25% during acute illness (PHELONEPHRITIS) Repeat echo 12-06-10 60% showed normal EF.    Chronic back pain greater than 3 months duration  S/P BACK SURG'S   CSF leak    Diabetes mellitus without complication (HCC)    dx 2013 type 2   Dyslipidemia    Dysphagia    some post op cerv fusion 2/14   Dysrhythmia    Essential hypertension, benign    Family history of adverse reaction to anesthesia    mother gets n/v    Family history of breast cancer 02/06/2022   Family history of liver cancer 02/06/2022   Family history of lung cancer 02/06/2022   Family history of ovarian cancer 02/06/2022   GERD (gastroesophageal reflux disease) AND HIATIAL HERNIA   CONTROLLED W/ NEXIUM   Headache(784.0)    History of chronic bronchitis    Hx of bladder infections    Hyperlipidemia    TAKE CHLOSTEROL MEDICATION,04/23/19   Hypertension    Neuromuscular disorder (HCC)    numbness and tingling   Osteoporosis    Other malaise and fatigue    PONV (postoperative nausea and vomiting)    Shortness of breath    Spinal headache    Spinal stenosis, cervical region    Varicosities    venous   Weakness of both legs DUE TO ARACHNOIDITIS   OCCASIONAL USES CANE   BP (!) 144/85   Pulse 85   Ht 5\' 5"  (1.651 m)   Wt 157 lb (71.2 kg)   SpO2 98%   BMI 26.13 kg/m   Opioid Risk Score:   Fall Risk Score:  `1  Depression screen PHQ 2/9     12/04/2022    1:27 PM 11/03/2022    1:19 PM 10/03/2022    1:11 PM 09/05/2022    1:38 PM 07/10/2022    1:22 PM 06/08/2022    1:14 PM 04/13/2022    1:35 PM  Depression screen PHQ 2/9  Decreased Interest 1 0 0 0 0 0 0  Down, Depressed, Hopeless 1 0 0 0 0 0 0  PHQ - 2 Score 2 0 0 0 0 0 0     Review of Systems  Constitutional: Negative.   HENT: Negative.    Eyes: Negative.   Respiratory: Negative.    Cardiovascular: Negative.   Gastrointestinal: Negative.   Endocrine: Negative.   Genitourinary: Negative.   Musculoskeletal:  Positive for arthralgias, back pain and myalgias.  Skin: Negative.   Allergic/Immunologic: Negative.   Neurological: Negative.   Hematological: Negative.   Psychiatric/Behavioral:  Positive for dysphoric mood.        Brother died last night  All other systems reviewed and are negative.      Objective:   Physical Exam Vitals and nursing note reviewed.  Constitutional:      Appearance: Normal appearance.  Neck:     Comments: Cervical Paraspinal  Tenderness: C-5-C-6  Cardiovascular:     Rate and Rhythm: Normal rate and regular rhythm.     Pulses: Normal pulses.     Heart sounds: Normal heart sounds.  Pulmonary:     Effort: Pulmonary effort is normal.     Breath sounds: Normal breath sounds.  Musculoskeletal:     Cervical back: Normal range of motion and neck supple.     Comments: Normal Muscle Bulk and Muscle Testing Reveals:  Upper Extremities: Full ROM and Muscle Strength 5/5 Bilateral AC Joint Tenderness Thoracic Paraspinal Tenderness: T-7-T-9 Lumbar Paraspinal Tenderness: L-3-L-5 Lower Extremities: Full ROM and Muscle Strength 5/5 Arises from Table with ease Narrow Based Gait     Skin:    General: Skin is warm and  dry.  Neurological:     Mental Status: She is alert and oriented to person, place, and time.  Psychiatric:        Mood and Affect: Mood normal.        Behavior: Behavior normal.         Assessment & Plan:  1. On 05/17/2017 :C5C6C7 T1 Posterior Cervical Fusion with lateral mass fixation with [facility], revision of prior instrumentation. By Dr. Venetia Maxon.  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 Continue current medication regimen with  Gabapentin 800 mg BID. Refilled: Oxycodone 15mg  one tablet 5 times a day  as needed for pain # 150 tablets We will continue the opioid monitoring program, this consists of regular clinic visits, examinations, urine drug screen, pill counts as well as use of [place] Controlled Substance Reporting system. 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 Continue to Monitor. 12/04/2022 3. Anxiety/depression: PCP Following. Continue to monitor. 12/04/2022. 4. Muscle Spasms: Continue current medication regime with Flexeril. Continue to Monitor. 12/04/2022 5.  Cervicalgia/ Cervical Radiculitis: Dr Venetia Maxon Following. ,Continue current medication regime with Gabapentin:  S/P  C5C6C7 T1 Posterior Cervical Fusion with lateral mass fixation with AIRO Imaging, revision of prior instrumentation by Dr. Venetia Maxon on 05/17/2017. 12/04/2022 7. Bilateral Thoracic Back Pain: Continue HEP as Tolerated and Continue current medication regimen. Continue to Monitor. 12/04/2022  8. Left lower extremity DVT/ Phlebitis:  S/P endovenous laser ablation of left great saphenous vein on 05/09/2018 by Dr. Edilia Bo:  Vascular  Following. 12/04/2022. 9. Insomnia: Continue Pamelor. Continue to Monitor. 12/04/2022.     F/U in 1 month

## 2022-12-14 DIAGNOSIS — E1143 Type 2 diabetes mellitus with diabetic autonomic (poly)neuropathy: Secondary | ICD-10-CM | POA: Diagnosis not present

## 2022-12-14 DIAGNOSIS — L570 Actinic keratosis: Secondary | ICD-10-CM | POA: Diagnosis not present

## 2022-12-14 DIAGNOSIS — E7849 Other hyperlipidemia: Secondary | ICD-10-CM | POA: Diagnosis not present

## 2022-12-14 DIAGNOSIS — I10 Essential (primary) hypertension: Secondary | ICD-10-CM | POA: Diagnosis not present

## 2022-12-14 DIAGNOSIS — M818 Other osteoporosis without current pathological fracture: Secondary | ICD-10-CM | POA: Diagnosis not present

## 2022-12-29 ENCOUNTER — Encounter: Payer: Self-pay | Admitting: Registered Nurse

## 2022-12-29 ENCOUNTER — Encounter: Payer: Medicare Other | Attending: Physical Medicine & Rehabilitation | Admitting: Registered Nurse

## 2022-12-29 VITALS — BP 138/83 | HR 84

## 2022-12-29 DIAGNOSIS — M5412 Radiculopathy, cervical region: Secondary | ICD-10-CM | POA: Diagnosis not present

## 2022-12-29 DIAGNOSIS — M542 Cervicalgia: Secondary | ICD-10-CM | POA: Diagnosis not present

## 2022-12-29 DIAGNOSIS — M25511 Pain in right shoulder: Secondary | ICD-10-CM | POA: Insufficient documentation

## 2022-12-29 DIAGNOSIS — G039 Meningitis, unspecified: Secondary | ICD-10-CM | POA: Diagnosis not present

## 2022-12-29 DIAGNOSIS — G8929 Other chronic pain: Secondary | ICD-10-CM | POA: Diagnosis not present

## 2022-12-29 DIAGNOSIS — G894 Chronic pain syndrome: Secondary | ICD-10-CM | POA: Diagnosis not present

## 2022-12-29 DIAGNOSIS — M546 Pain in thoracic spine: Secondary | ICD-10-CM | POA: Diagnosis not present

## 2022-12-29 DIAGNOSIS — Z5181 Encounter for therapeutic drug level monitoring: Secondary | ICD-10-CM

## 2022-12-29 DIAGNOSIS — M25512 Pain in left shoulder: Secondary | ICD-10-CM | POA: Insufficient documentation

## 2022-12-29 DIAGNOSIS — Z79891 Long term (current) use of opiate analgesic: Secondary | ICD-10-CM

## 2022-12-29 DIAGNOSIS — M5416 Radiculopathy, lumbar region: Secondary | ICD-10-CM | POA: Diagnosis not present

## 2022-12-29 DIAGNOSIS — M961 Postlaminectomy syndrome, not elsewhere classified: Secondary | ICD-10-CM

## 2022-12-29 MED ORDER — OXYCODONE HCL 15 MG PO TABS: 15.0000 mg | ORAL_TABLET | Freq: Every day | ORAL | 0 refills | Status: DC | PRN

## 2022-12-29 MED ORDER — GABAPENTIN 800 MG PO TABS: 800.0000 mg | ORAL_TABLET | Freq: Three times a day (TID) | ORAL | 1 refills | Status: DC

## 2022-12-29 NOTE — Progress Notes (Signed)
Subjective:    Patient ID: [name] [name], female    DOB: 07-02-1950, 72 y.o.   MRN: [id number]  HPI: [name] [name] is a 72 y.o. female who returns for follow up appointment for chronic pain and medication refill. She states her pain is located in her neck radiating into her bilateral shoulders and mid- lower back pain radiating into her bilateral lower extremities. She rates her pain 7. Her .current exercise regime is walking and performing stretching exercises.  [name] [name] Morphine equivalent is 112.50 MME.   Last UDS on 09/05/2022, it was consistent.     Pain Inventory Average Pain 7 Pain Right Now 7 My pain is sharp, burning, dull, stabbing, tingling, and aching  In the last 24 hours, has pain interfered with the following? General activity 8 Relation with others 8 Enjoyment of life 8 What TIME of day is your pain at its worst? morning , daytime, evening, and night Sleep (in general) Fair  Pain is worse with: walking, bending, sitting, inactivity, and standing Pain improves with: rest and medication Relief from Meds: 7  Family History  Problem Relation Age of Onset   Hypertension Mother    Heart disease Mother        Mitral valve replacement.    Diabetes Mother    Liver cancer Father 65       HCC   Diabetes Brother    Lung cancer Brother 60       mets   Melanoma Brother 71       neck and back   Cancer Paternal Uncle        unknown type; dx after 33   Breast cancer Paternal Grandmother        dx 64s   Ovarian cancer Cousin        paternal cousin; d. 30s   Cancer Cousin        GYN; d. 15s   Colon cancer Neg Hx    Colon polyps Neg Hx    Esophageal cancer Neg Hx    Rectal cancer Neg Hx    Stomach cancer Neg Hx    Social History   Socioeconomic History   Marital status: Divorced    Spouse name: Not on file   Number of children: Not on file   Years of education: Not on file   Highest education level: Not on file  Occupational History   Occupation: disable     Employer: DISABLED  Tobacco Use   Smoking status: Never   Smokeless tobacco: Never  Vaping Use   Vaping status: Never Used  Substance and Sexual Activity   Alcohol use: No   Drug use: No   Sexual activity: Not on file    Comment: second hand smoke  Other Topics Concern   Not on file  Social History Narrative   Has one child lives at home with her.    Social Determinants of Health   Financial Resource Strain: Not on file  Food Insecurity: Not on file  Transportation Needs: Not on file  Physical Activity: Not on file  Stress: Not on file  Social Connections: Not on file   Past Surgical History:  Procedure Laterality Date   ABDOMINAL HYSTERECTOMY  1987   W/ BSO   ANTERIOR CERVICAL DECOMP/DISCECTOMY FUSION N/A 08/09/2012   Procedure: ANTERIOR CERVICAL DECOMPRESSION/DISCECTOMY FUSION 1 LEVEL;  Surgeon: Karn Cassis, MD;  Location: [facility];  Service: Neurosurgery;  Laterality: N/A;  Cervical four-five  Anterior cervical  decompression/diskectomy/fusion   ANTERIOR FUSION CERVICAL SPINE  2007   C5 -6   APPENDECTOMY     APPLICATION OF INTRAOPERATIVE CT SCAN N/A 05/17/2017   Procedure: APPLICATION OF INTRAOPERATIVE CT SCAN;  Surgeon: Maeola Harman, MD;  Location: [facility];  Service: Neurosurgery;  Laterality: N/A;   BACK SURGERY     x 5   BREAST EXCISIONAL BIOPSY Bilateral    No scar seen    BREAST SURGERY     x 2 biopsies   CARPAL TUNNEL RELEASE  RIGHT - 2001  & DEC 2011 W/ BACK SURG.   CERVICAL DISC SURGERY  2005   C5 - 6   CHOLECYSTECTOMY  1994   COLONOSCOPY WITH PROPOFOL N/A 05/16/2022   Procedure: COLONOSCOPY WITH PROPOFOL;  Surgeon: Dolores Frame, MD;  [facility];  Service: Gastroenterology;  Laterality: N/A;  945 ASA 3   DORSAL COMPARTMENT RELEASE Right 04/07/2013   Procedure: RIGHT WRIST STS RELEASE;  Surgeon: Marlowe Shores, MD;  Location: [facility];  Service: Orthopedics;  Laterality: Right;   ENDOVENOUS ABLATION  SAPHENOUS VEIN W/ LASER Right 05/29/2019   endovenous laser ablation right greater saphenous vein by Cari Caraway MD    EXPLORATION OF INCISION FOR CSF LEAK  DEC 2011  X2   POST LAMINECOTMY   FINGER ARTHRODESIS Right 04/07/2013   Procedure: RIGHT INDEX AND RIGHT LONG DISTAL INTERPHALANGEAL JOINT FUSIONS;  Surgeon: Marlowe Shores, MD;  Location: [facility];  Service: Orthopedics;  Laterality: Right;   FINGER ARTHROPLASTY Right 04/07/2013   Procedure: RIGHT THUMB CMC ARTHROPLASTY;  Surgeon: Marlowe Shores, MD;  Location: [facility];  Service: Orthopedics;  Laterality: Right;   GANGLION CYST EXCISION Right 04/07/2013   Procedure: RIGHT WRIST MASS EXCISION;  Surgeon: Marlowe Shores, MD;  Location: [facility];  Service: Orthopedics;  Laterality: Right;   KNEE ARTHROSCOPY  LEFT X3 (LAST ONE 2005)   KNEE ARTHROSCOPY  05/17/2011   Procedure: ARTHROSCOPY KNEE;  Surgeon: Curlene Labrum III;  Location: [facility];  Service: Orthopedics;  Laterality: Right;  WITH MEDIAL MENISECTOMY AND removal of suprapatella fat lump   LEFT WRIST TENOSYNECTOMY W/ LEFT THUMB JOINT REPAIR  02-24-10   LUMBAR FUSION  11-30-10   L4 - 5   LUMBAR FUSION  2000   L2 - 4   LUMBAR LAMINECTOMY  DEC 2011   L4 - 5   POSTERIOR CERVICAL FUSION/FORAMINOTOMY N/A 05/17/2017   Procedure: Cervical five  to Thorastic one  Posterior cervical fusion with lateral mass screws/revision of prior instrumentation;  Surgeon: Maeola Harman, MD;  Location: [facility];  Service: Neurosurgery;  Laterality: N/A;  C5 to T1 Posterior cervical fusion with lateral mass screws/revision of prior instrumentation   TENDON REPAIR  JAN 2010   LEFT INDEX AND LONG FINGERS   THUMB SURGERY   04/07/2019   RIGHT THUMB   TUBAL LIGATION     Past Surgical History:  Procedure Laterality Date   ABDOMINAL HYSTERECTOMY  1987   W/ BSO   ANTERIOR CERVICAL DECOMP/DISCECTOMY FUSION N/A 08/09/2012    Procedure: ANTERIOR CERVICAL DECOMPRESSION/DISCECTOMY FUSION 1 LEVEL;  Surgeon: Karn Cassis, MD;  Location: [facility];  Service: Neurosurgery;  Laterality: N/A;  Cervical four-five  Anterior cervical decompression/diskectomy/fusion   ANTERIOR FUSION CERVICAL SPINE  2007   C5 -6   APPENDECTOMY     APPLICATION OF INTRAOPERATIVE CT SCAN N/A 05/17/2017   Procedure: APPLICATION OF INTRAOPERATIVE CT  SCAN;  Surgeon: Maeola Harman, MD;  Location: [facility] OR;  Service: Neurosurgery;  Laterality: N/A;   BACK SURGERY     x 5   BREAST EXCISIONAL BIOPSY Bilateral    No scar seen    BREAST SURGERY     x 2 biopsies   CARPAL TUNNEL RELEASE  RIGHT - 2001  & DEC 2011 W/ BACK SURG.   CERVICAL DISC SURGERY  2005   C5 - 6   CHOLECYSTECTOMY  1994   COLONOSCOPY WITH PROPOFOL N/A 05/16/2022   Procedure: COLONOSCOPY WITH PROPOFOL;  Surgeon: Dolores Frame, MD;  [facility];  Service: Gastroenterology;  Laterality: N/A;  945 ASA 3   DORSAL COMPARTMENT RELEASE Right 04/07/2013   Procedure: RIGHT WRIST STS RELEASE;  Surgeon: Marlowe Shores, MD;  Location: [facility];  Service: Orthopedics;  Laterality: Right;   ENDOVENOUS ABLATION SAPHENOUS VEIN W/ LASER Right 05/29/2019   endovenous laser ablation right greater saphenous vein by Cari Caraway MD    EXPLORATION OF INCISION FOR CSF LEAK  DEC 2011  X2   POST LAMINECOTMY   FINGER ARTHRODESIS Right 04/07/2013   Procedure: RIGHT INDEX AND RIGHT LONG DISTAL INTERPHALANGEAL JOINT FUSIONS;  Surgeon: Marlowe Shores, MD;  Location: [facility];  Service: Orthopedics;  Laterality: Right;   FINGER ARTHROPLASTY Right 04/07/2013   Procedure: RIGHT THUMB CMC ARTHROPLASTY;  Surgeon: Marlowe Shores, MD;  Location: [facility];  Service: Orthopedics;  Laterality: Right;   GANGLION CYST EXCISION Right 04/07/2013   Procedure: RIGHT WRIST MASS EXCISION;  Surgeon: Marlowe Shores, MD;  Location: [facility];  Service: Orthopedics;  Laterality: Right;   KNEE ARTHROSCOPY  LEFT X3 (LAST ONE 2005)   KNEE ARTHROSCOPY  05/17/2011   Procedure: ARTHROSCOPY KNEE;  Surgeon: Curlene Labrum III;  Location: [facility];  Service: Orthopedics;  Laterality: Right;  WITH MEDIAL MENISECTOMY AND removal of suprapatella fat lump   LEFT WRIST TENOSYNECTOMY W/ LEFT THUMB JOINT REPAIR  02-24-10   LUMBAR FUSION  11-30-10   L4 - 5   LUMBAR FUSION  2000   L2 - 4   LUMBAR LAMINECTOMY  DEC 2011   L4 - 5   POSTERIOR CERVICAL FUSION/FORAMINOTOMY N/A 05/17/2017   Procedure: Cervical five  to Thorastic one  Posterior cervical fusion with lateral mass screws/revision of prior instrumentation;  Surgeon: Maeola Harman, MD;  Location: [facility];  Service: Neurosurgery;  Laterality: N/A;  C5 to T1 Posterior cervical fusion with lateral mass screws/revision of prior instrumentation   TENDON REPAIR  JAN 2010   LEFT INDEX AND LONG FINGERS   THUMB SURGERY   04/07/2019   RIGHT THUMB   TUBAL LIGATION     Past Medical History:  Diagnosis Date   Acute sinusitis, unspecified    Allergy    Anemia    "quite a few times"   Anxiety state, unspecified    Arachnoiditis BILATERAL LEGS   DUE TO MULTIPLE BACK SURG.'S   Arthritis    Asthma    last flare up was 03/2017 lasted over a month   Blood transfusion    Cancer (HCC)    skin cancers (in scalp)   Cardiomyopathy HX --06/2010   EF was 25% during acute illness (PHELONEPHRITIS) Repeat echo 12-06-10 60% showed normal EF.    Chronic back pain greater than 3 months duration    S/P BACK SURG'S   CSF leak    Diabetes mellitus  without complication (HCC)    dx 2013 type 2   Dyslipidemia    Dysphagia    some post op cerv fusion 2/14   Dysrhythmia    Essential hypertension, benign    Family history of adverse reaction to anesthesia    mother gets n/v   Family history of breast cancer 02/06/2022   Family history of liver cancer 02/06/2022   Family  history of lung cancer 02/06/2022   Family history of ovarian cancer 02/06/2022   GERD (gastroesophageal reflux disease) AND HIATIAL HERNIA   CONTROLLED W/ NEXIUM   Headache(784.0)    History of chronic bronchitis    Hx of bladder infections    Hyperlipidemia    TAKE CHLOSTEROL MEDICATION,04/23/19   Hypertension    Neuromuscular disorder (HCC)    numbness and tingling   Osteoporosis    Other malaise and fatigue    PONV (postoperative nausea and vomiting)    Shortness of breath    Spinal headache    Spinal stenosis, cervical region    Varicosities    venous   Weakness of both legs DUE TO ARACHNOIDITIS   OCCASIONAL USES CANE   BP 138/83   Pulse 84   SpO2 100%   Opioid Risk Score:   Fall Risk Score:  `1  Depression screen PHQ 2/9     12/04/2022    1:27 PM 11/03/2022    1:19 PM 10/03/2022    1:11 PM 09/05/2022    1:38 PM 07/10/2022    1:22 PM 06/08/2022    1:14 PM 04/13/2022    1:35 PM  Depression screen PHQ 2/9  Decreased Interest 1 0 0 0 0 0 0  Down, Depressed, Hopeless 1 0 0 0 0 0 0  PHQ - 2 Score 2 0 0 0 0 0 0     Review of Systems  Musculoskeletal:        Bilateral leg pain  All other systems reviewed and are negative.     Objective:   Physical Exam Vitals and nursing note reviewed.  Constitutional:      Appearance: Normal appearance.  Cardiovascular:     Rate and Rhythm: Normal rate and regular rhythm.     Pulses: Normal pulses.     Heart sounds: Normal heart sounds.  Pulmonary:     Effort: Pulmonary effort is normal.     Breath sounds: Normal breath sounds.  Musculoskeletal:     Cervical back: Normal range of motion and neck supple.     Right lower leg: Edema present.     Left lower leg: Edema present.     Comments: Normal Muscle Bulk and Muscle Testing Reveals:  Upper Extremities: Full ROM and Muscle Strength 5/5  Thoracic Paraspinal Tenderness: T-7-T-9 Lumbar Hypersensitivity Lower Extremities: Full ROM and Muscle Strength 5/5 Arises from Table  with ease Narrow Based  Gait     Skin:    General: Skin is warm and dry.  Neurological:     Mental Status: She is alert and oriented to person, place, and time.  Psychiatric:        Mood and Affect: Mood normal.        Behavior: Behavior normal.         Assessment & Plan:  1. On 05/17/2017 :C5C6C7 T1 Posterior Cervical Fusion with lateral mass fixation with [facility] Imaging, revision of prior instrumentation. By Dr. Venetia Maxon.  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 Continue current medication regimen with  Gabapentin 800  mg BID. Refilled: Oxycodone 15mg  one tablet 5 times a day  as needed for pain # 150 tablets We will continue the opioid monitoring program, this consists of regular clinic visits, examinations, urine drug screen, pill counts as well as use of [place] Controlled Substance Reporting system. 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 Continue to Monitor. 12/29/2022 3. Anxiety/depression: PCP Following. Continue to monitor. 12/29/2022. 4. Muscle Spasms: Continue current medication regime with Flexeril. Continue to Monitor. 12/29/2022 5. Cervicalgia/ Cervical Radiculitis: Dr Venetia Maxon Following. ,Continue current medication regime with Gabapentin:  S/P  C5C6C7 T1 Posterior Cervical Fusion with lateral mass fixation with AIRO Imaging, revision of prior instrumentation by Dr. Venetia Maxon on 05/17/2017. 12/29/2022 7. Bilateral Thoracic Back Pain: Continue HEP as Tolerated and Continue current medication regimen. Continue to Monitor. 12/29/2022  8. Left lower extremity DVT/ Phlebitis:  S/P endovenous laser ablation of left great saphenous vein on 05/09/2018 by Dr. Edilia Bo:  Vascular  Following. 12/29/2022. 9. Insomnia: Continue Pamelor. Continue to Monitor. 12/29/2022.      F/U in 1 month

## 2023-01-04 ENCOUNTER — Other Ambulatory Visit: Payer: Self-pay | Admitting: Internal Medicine

## 2023-01-04 DIAGNOSIS — Z1231 Encounter for screening mammogram for malignant neoplasm of breast: Secondary | ICD-10-CM

## 2023-02-05 ENCOUNTER — Encounter: Payer: Medicare Other | Attending: Physical Medicine & Rehabilitation | Admitting: Registered Nurse

## 2023-02-05 VITALS — BP 144/95 | HR 76 | Ht 65.0 in | Wt 163.0 lb

## 2023-02-05 DIAGNOSIS — M25512 Pain in left shoulder: Secondary | ICD-10-CM | POA: Insufficient documentation

## 2023-02-05 DIAGNOSIS — M25511 Pain in right shoulder: Secondary | ICD-10-CM | POA: Insufficient documentation

## 2023-02-05 DIAGNOSIS — G039 Meningitis, unspecified: Secondary | ICD-10-CM

## 2023-02-05 DIAGNOSIS — M5416 Radiculopathy, lumbar region: Secondary | ICD-10-CM

## 2023-02-05 DIAGNOSIS — Z79891 Long term (current) use of opiate analgesic: Secondary | ICD-10-CM

## 2023-02-05 DIAGNOSIS — M961 Postlaminectomy syndrome, not elsewhere classified: Secondary | ICD-10-CM | POA: Diagnosis not present

## 2023-02-05 DIAGNOSIS — G894 Chronic pain syndrome: Secondary | ICD-10-CM | POA: Diagnosis not present

## 2023-02-05 DIAGNOSIS — Z5181 Encounter for therapeutic drug level monitoring: Secondary | ICD-10-CM | POA: Diagnosis not present

## 2023-02-05 DIAGNOSIS — G8929 Other chronic pain: Secondary | ICD-10-CM | POA: Diagnosis not present

## 2023-02-05 DIAGNOSIS — M542 Cervicalgia: Secondary | ICD-10-CM

## 2023-02-05 DIAGNOSIS — M546 Pain in thoracic spine: Secondary | ICD-10-CM | POA: Diagnosis not present

## 2023-02-05 MED ORDER — GABAPENTIN 800 MG PO TABS: 800.0000 mg | ORAL_TABLET | Freq: Three times a day (TID) | ORAL | 1 refills | Status: DC

## 2023-02-05 MED ORDER — OXYCODONE HCL 15 MG PO TABS: 15.0000 mg | ORAL_TABLET | Freq: Every day | ORAL | 0 refills | Status: DC | PRN

## 2023-02-05 NOTE — Progress Notes (Unsigned)
Subjective:    Patient ID: [name] [name], female    DOB: 06-Oct-1950, 72 y.o.   MRN: [id number]  HPI: [name] [name] is a 72 y.o. female who returns for follow up appointment for chronic pain and medication refill. states *** pain is located in  ***. rates pain ***. current exercise regime is walking and performing stretching exercises.  Ms. Landgren Morphine equivalent is *** MME.   UDS was Performed today.     Pain Inventory Average Pain 7 Pain Right Now 7 My pain is constant, sharp, burning, stabbing, tingling, and aching  In the last 24 hours, has pain interfered with the following? General activity 7 Relation with others 7 Enjoyment of life 7 What TIME of day is your pain at its worst? morning , daytime, evening, and night Sleep (in general) Fair  Pain is worse with: walking, bending, sitting, standing, and some activites Pain improves with: heat/ice, pacing activities, and medication Relief from Meds: 7  Family History  Problem Relation Age of Onset   Hypertension Mother    Heart disease Mother        Mitral valve replacement.    Diabetes Mother    Liver cancer Father 61       HCC   Diabetes Brother    Lung cancer Brother 49       mets   Melanoma Brother 62       neck and back   Cancer Paternal Uncle        unknown type; dx after 72   Breast cancer Paternal Grandmother        dx 30s   Ovarian cancer Cousin        paternal cousin; d. 30s   Cancer Cousin        GYN; d. 51s   Colon cancer Neg Hx    Colon polyps Neg Hx    Esophageal cancer Neg Hx    Rectal cancer Neg Hx    Stomach cancer Neg Hx    Social History   Socioeconomic History   Marital status: Divorced    Spouse name: Not on file   Number of children: Not on file   Years of education: Not on file   Highest education level: Not on file  Occupational History   Occupation: disable    Employer: DISABLED  Tobacco Use   Smoking status: Never   Smokeless tobacco: Never  Vaping Use   Vaping status:  Never Used  Substance and Sexual Activity   Alcohol use: No   Drug use: No   Sexual activity: Not on file    Comment: second hand smoke  Other Topics Concern   Not on file  Social History Narrative   Has one child lives at home with her.    Social Determinants of Health   Financial Resource Strain: Not on file  Food Insecurity: Not on file  Transportation Needs: Not on file  Physical Activity: Not on file  Stress: Not on file  Social Connections: Not on file   Past Surgical History:  Procedure Laterality Date   ABDOMINAL HYSTERECTOMY  1987   W/ BSO   ANTERIOR CERVICAL DECOMP/DISCECTOMY FUSION N/A 08/09/2012   Procedure: ANTERIOR CERVICAL DECOMPRESSION/DISCECTOMY FUSION 1 LEVEL;  Surgeon: Karn Cassis, MD;  Location: [facility];  Service: Neurosurgery;  Laterality: N/A;  Cervical four-five  Anterior cervical decompression/diskectomy/fusion   ANTERIOR FUSION CERVICAL SPINE  2007   C5 -6   APPENDECTOMY  APPLICATION OF INTRAOPERATIVE CT SCAN N/A 05/17/2017   Procedure: APPLICATION OF INTRAOPERATIVE CT SCAN;  Surgeon: Maeola Harman, MD;  Location[facility];  Service: Neurosurgery;  Laterality: N/A;   BACK SURGERY     x 5   BREAST EXCISIONAL BIOPSY Bilateral    No scar seen    BREAST SURGERY     x 2 biopsies   CARPAL TUNNEL RELEASE  RIGHT - 2001  & DEC 2011 W/ BACK SURG.   CERVICAL DISC SURGERY  2005   C5 - 6   CHOLECYSTECTOMY  1994   COLONOSCOPY WITH PROPOFOL N/A 05/16/2022   Procedure: COLONOSCOPY WITH PROPOFOL;  Surgeon: Dolores Frame, MD;  [facility];  Service: Gastroenterology;  Laterality: N/A;  945 ASA 3   DORSAL COMPARTMENT RELEASE Right 04/07/2013   Procedure: RIGHT WRIST STS RELEASE;  Surgeon: Marlowe Shores, MD;  Location: [facility];  Service: Orthopedics;  Laterality: Right;   ENDOVENOUS ABLATION SAPHENOUS VEIN W/ LASER Right 05/29/2019   endovenous laser ablation right greater saphenous vein by Cari Caraway MD     EXPLORATION OF INCISION FOR CSF LEAK  DEC 2011  X2   POST LAMINECOTMY   FINGER ARTHRODESIS Right 04/07/2013   Procedure: RIGHT INDEX AND RIGHT LONG DISTAL INTERPHALANGEAL JOINT FUSIONS;  Surgeon: Marlowe Shores, MD;  Location: [facility];  Service: Orthopedics;  Laterality: Right;   FINGER ARTHROPLASTY Right 04/07/2013   Procedure: RIGHT THUMB CMC ARTHROPLASTY;  Surgeon: Marlowe Shores, MD;  Location: [facility];  Service: Orthopedics;  Laterality: Right;   GANGLION CYST EXCISION Right 04/07/2013   Procedure: RIGHT WRIST MASS EXCISION;  Surgeon: Marlowe Shores, MD;  Location:  SURGERY CENTER;  Service: Orthopedics;  Laterality: Right;   KNEE ARTHROSCOPY  LEFT X3 (LAST ONE 2005)   KNEE ARTHROSCOPY  05/17/2011   Procedure: ARTHROSCOPY KNEE;  Surgeon: Curlene Labrum III;  Location: [facility];  Service: Orthopedics;  Laterality: Right;  WITH MEDIAL MENISECTOMY AND removal of suprapatella fat lump   LEFT WRIST TENOSYNECTOMY W/ LEFT THUMB JOINT REPAIR  02-24-10   LUMBAR FUSION  11-30-10   L4 - 5   LUMBAR FUSION  2000   L2 - 4   LUMBAR LAMINECTOMY  DEC 2011   L4 - 5   POSTERIOR CERVICAL FUSION/FORAMINOTOMY N/A 05/17/2017   Procedure: Cervical five  to Thorastic one  Posterior cervical fusion with lateral mass screws/revision of prior instrumentation;  Surgeon: Maeola Harman, MD;  Location: [facility];  Service: Neurosurgery;  Laterality: N/A;  C5 to T1 Posterior cervical fusion with lateral mass screws/revision of prior instrumentation   TENDON REPAIR  JAN 2010   LEFT INDEX AND LONG FINGERS   THUMB SURGERY   04/07/2019   RIGHT THUMB   TUBAL LIGATION     Past Surgical History:  Procedure Laterality Date   ABDOMINAL HYSTERECTOMY  1987   W/ BSO   ANTERIOR CERVICAL DECOMP/DISCECTOMY FUSION N/A 08/09/2012   Procedure: ANTERIOR CERVICAL DECOMPRESSION/DISCECTOMY FUSION 1 LEVEL;  Surgeon: Karn Cassis, MD;  Location: [facility];   Service: Neurosurgery;  Laterality: N/A;  Cervical four-five  Anterior cervical decompression/diskectomy/fusion   ANTERIOR FUSION CERVICAL SPINE  2007   C5 -6   APPENDECTOMY     APPLICATION OF INTRAOPERATIVE CT SCAN N/A 05/17/2017   Procedure: APPLICATION OF INTRAOPERATIVE CT SCAN;  Surgeon: Maeola Harman, MD;  Location: [facility];  Service: Neurosurgery;  Laterality: N/A;   BACK SURGERY  x 5   BREAST EXCISIONAL BIOPSY Bilateral    No scar seen    BREAST SURGERY     x 2 biopsies   CARPAL TUNNEL RELEASE  RIGHT - 2001  & DEC 2011 W/ BACK SURG.   CERVICAL DISC SURGERY  2005   C5 - 6   CHOLECYSTECTOMY  1994   COLONOSCOPY WITH PROPOFOL N/A 05/16/2022   Procedure: COLONOSCOPY WITH PROPOFOL;  Surgeon: Dolores Frame, MD;  [facility];  Service: Gastroenterology;  Laterality: N/A;  945 ASA 3   DORSAL COMPARTMENT RELEASE Right 04/07/2013   Procedure: RIGHT WRIST STS RELEASE;  Surgeon: Marlowe Shores, MD;  Location: [facility];  Service: Orthopedics;  Laterality: Right;   ENDOVENOUS ABLATION SAPHENOUS VEIN W/ LASER Right 05/29/2019   endovenous laser ablation right greater saphenous vein by Cari Caraway MD    EXPLORATION OF INCISION FOR CSF LEAK  DEC 2011  X2   POST LAMINECOTMY   FINGER ARTHRODESIS Right 04/07/2013   Procedure: RIGHT INDEX AND RIGHT LONG DISTAL INTERPHALANGEAL JOINT FUSIONS;  Surgeon: Marlowe Shores, MD;  Location: [facility];  Service: Orthopedics;  Laterality: Right;   FINGER ARTHROPLASTY Right 04/07/2013   Procedure: RIGHT THUMB CMC ARTHROPLASTY;  Surgeon: Marlowe Shores, MD;  Location: [facility];  Service: Orthopedics;  Laterality: Right;   GANGLION CYST EXCISION Right 04/07/2013   Procedure: RIGHT WRIST MASS EXCISION;  Surgeon: Marlowe Shores, MD;  Location: [facility];  Service: Orthopedics;  Laterality: Right;   KNEE ARTHROSCOPY  LEFT X3 (LAST ONE 2005)   KNEE ARTHROSCOPY   05/17/2011   Procedure: ARTHROSCOPY KNEE;  Surgeon: Curlene Labrum III;  Location: [facility];  Service: Orthopedics;  Laterality: Right;  WITH MEDIAL MENISECTOMY AND removal of suprapatella fat lump   LEFT WRIST TENOSYNECTOMY W/ LEFT THUMB JOINT REPAIR  02-24-10   LUMBAR FUSION  11-30-10   L4 - 5   LUMBAR FUSION  2000   L2 - 4   LUMBAR LAMINECTOMY  DEC 2011   L4 - 5   POSTERIOR CERVICAL FUSION/FORAMINOTOMY N/A 05/17/2017   Procedure: Cervical five  to Thorastic one  Posterior cervical fusion with lateral mass screws/revision of prior instrumentation;  Surgeon: Maeola Harman, MD;  Location: [facility];  Service: Neurosurgery;  Laterality: N/A;  C5 to T1 Posterior cervical fusion with lateral mass screws/revision of prior instrumentation   TENDON REPAIR  JAN 2010   LEFT INDEX AND LONG FINGERS   THUMB SURGERY   04/07/2019   RIGHT THUMB   TUBAL LIGATION     Past Medical History:  Diagnosis Date   Acute sinusitis, unspecified    Allergy    Anemia    "quite a few times"   Anxiety state, unspecified    Arachnoiditis BILATERAL LEGS   DUE TO MULTIPLE BACK SURG.'S   Arthritis    Asthma    last flare up was 03/2017 lasted over a month   Blood transfusion    Cancer (HCC)    skin cancers (in scalp)   Cardiomyopathy HX --06/2010   EF was 25% during acute illness (PHELONEPHRITIS) Repeat echo 12-06-10 60% showed normal EF.    Chronic back pain greater than 3 months duration    S/P BACK SURG'S   CSF leak    Diabetes mellitus without complication (HCC)    dx 2013 type 2   Dyslipidemia    Dysphagia    some post op cerv  fusion 2/14   Dysrhythmia    Essential hypertension, benign    Family history of adverse reaction to anesthesia    mother gets n/v   Family history of breast cancer 02/06/2022   Family history of liver cancer 02/06/2022   Family history of lung cancer 02/06/2022   Family history of ovarian cancer 02/06/2022   GERD (gastroesophageal reflux disease) AND HIATIAL  HERNIA   CONTROLLED W/ NEXIUM   Headache(784.0)    History of chronic bronchitis    Hx of bladder infections    Hyperlipidemia    TAKE CHLOSTEROL MEDICATION,04/23/19   Hypertension    Neuromuscular disorder (HCC)    numbness and tingling   Osteoporosis    Other malaise and fatigue    PONV (postoperative nausea and vomiting)    Shortness of breath    Spinal headache    Spinal stenosis, cervical region    Varicosities    venous   Weakness of both legs DUE TO ARACHNOIDITIS   OCCASIONAL USES CANE   BP (!) 144/95   Pulse 76   Ht 5\' 5"  (1.651 m)   Wt 163 lb (73.9 kg)   SpO2 100%   BMI 27.12 kg/m   Opioid Risk Score:   Fall Risk Score:  `1  Depression screen PHQ 2/9     12/04/2022    1:27 PM 11/03/2022    1:19 PM 10/03/2022    1:11 PM 09/05/2022    1:38 PM 07/10/2022    1:22 PM 06/08/2022    1:14 PM 04/13/2022    1:35 PM  Depression screen PHQ 2/9  Decreased Interest 1 0 0 0 0 0 0  Down, Depressed, Hopeless 1 0 0 0 0 0 0  PHQ - 2 Score 2 0 0 0 0 0 0     Review of Systems  Musculoskeletal:  Positive for back pain.       B/L leg pain  All other systems reviewed and are negative.      Objective:   Physical Exam        Assessment & Plan:  1. On 05/17/2017 :C5C6C7 T1 Posterior Cervical Fusion with lateral mass fixation with AIR[employer] Imaging, revision of prior instrumentation. By Dr. Venetia Maxon.  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 Continue current medication regimen with  Gabapentin 800 mg BID. Refilled: Oxycodone 15mg  one tablet 5 times a day  as needed for pain # 150 tablets We will continue the opioid monitoring program, this consists of regular clinic visits, examinations, urine drug screen, pill counts as well as use of [place] Controlled Substance Reporting system. 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 Continue to Monitor. 12/04/2022 3. Anxiety/depression: PCP Following. Continue to monitor. 12/04/2022. 4. Muscle Spasms: Continue current medication regime with Flexeril. Continue to Monitor. 12/04/2022 5. Cervicalgia/ Cervical Radiculitis: Dr Venetia Maxon Following. ,Continue current medication regime with Gabapentin:  S/P  C5C6C7 T1 Posterior Cervical Fusion with lateral mass fixation with AIRO Imaging, revision of prior instrumentation by Dr. Venetia Maxon on 05/17/2017. 12/04/2022 7. Bilateral Thoracic Back Pain: Continue HEP as Tolerated and Continue current medication regimen. Continue to Monitor. 12/04/2022  8. Left lower extremity DVT/ Phlebitis:  S/P endovenous laser ablation of left great saphenous vein on 05/09/2018 by Dr. Edilia Bo:  Vascular  Following. 12/04/2022. 9. Insomnia: Continue Pamelor. Continue to Monitor. 12/04/2022.     F/U  in 1 month

## 2023-02-06 ENCOUNTER — Ambulatory Visit: Payer: Medicare Other | Admitting: Internal Medicine

## 2023-02-06 ENCOUNTER — Encounter: Payer: Self-pay | Admitting: Registered Nurse

## 2023-02-06 ENCOUNTER — Encounter: Payer: Self-pay | Admitting: Internal Medicine

## 2023-02-06 VITALS — BP 130/90 | HR 81 | Ht 65.0 in | Wt 166.3 lb

## 2023-02-06 DIAGNOSIS — R918 Other nonspecific abnormal finding of lung field: Secondary | ICD-10-CM

## 2023-02-06 DIAGNOSIS — Z809 Family history of malignant neoplasm, unspecified: Secondary | ICD-10-CM | POA: Diagnosis not present

## 2023-02-06 DIAGNOSIS — J209 Acute bronchitis, unspecified: Secondary | ICD-10-CM | POA: Diagnosis not present

## 2023-02-06 DIAGNOSIS — J4540 Moderate persistent asthma, uncomplicated: Secondary | ICD-10-CM

## 2023-02-06 MED ORDER — DOXYCYCLINE HYCLATE 100 MG PO TABS: 100.0000 mg | ORAL_TABLET | Freq: Two times a day (BID) | ORAL | 0 refills | Status: DC

## 2023-02-06 NOTE — Progress Notes (Signed)
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 DECOMP/DISCECTOMY FUSION N/A 08/09/2012   Procedure: ANTERIOR CERVICAL DECOMPRESSION/DISCECTOMY FUSION 1 LEVEL;  Surgeon: Karn Cassis, MD;  Location: [facility];  Service: Neurosurgery;  Laterality: N/A;  Cervical four-five  Anterior cervical decompression/diskectomy/fusion   ANTERIOR FUSION CERVICAL SPINE  2007   C5 -6   APPENDECTOMY     APPLICATION OF INTRAOPERATIVE CT SCAN N/A 05/17/2017   Procedure: APPLICATION OF INTRAOPERATIVE CT SCAN;  Surgeon: Maeola Harman, MD;  Location: [facility];  Service: Neurosurgery;  Laterality: N/A;   BACK SURGERY     x 5   BREAST EXCISIONAL BIOPSY Bilateral    No scar seen    BREAST SURGERY     x 2 biopsies   CARPAL TUNNEL RELEASE  RIGHT - 2001  & DEC 2011 W/ BACK SURG.   CERVICAL DISC SURGERY  2005   C5 - 6   CHOLECYSTECTOMY  1994   COLONOSCOPY WITH PROPOFOL N/A 05/16/2022   Procedure: COLONOSCOPY WITH PROPOFOL;  Surgeon: Dolores Frame, MD;  Location: [facility];  Service:  Gastroenterology;  Laterality: N/A;  945 ASA 3   DORSAL COMPARTMENT RELEASE Right 04/07/2013   Procedure: RIGHT WRIST STS RELEASE;  Surgeon: Marlowe Shores, MD;  Location: [facility];  Service: Orthopedics;  Laterality: Right;   ENDOVENOUS ABLATION SAPHENOUS VEIN W/ LASER Right 05/29/2019   endovenous laser ablation right greater saphenous vein by Cari Caraway MD    EXPLORATION OF INCISION FOR CSF LEAK  DEC 2011  X2   POST LAMINECOTMY   FINGER ARTHRODESIS Right 04/07/2013   Procedure: RIGHT INDEX AND RIGHT LONG DISTAL INTERPHALANGEAL JOINT FUSIONS;  Surgeon: Marlowe Shores, MD;  Location: [facility];  Service: Orthopedics;  Laterality: Right;   FINGER ARTHROPLASTY Right 04/07/2013   Procedure: RIGHT THUMB CMC ARTHROPLASTY;  Surgeon: Marlowe Shores, MD;  Location: [facility];  Service: Orthopedics;  Laterality: Right;   GANGLION CYST EXCISION Right 04/07/2013   Procedure: RIGHT WRIST MASS EXCISION;  Surgeon: Marlowe Shores, MD;  Location: [facility];  Service: Orthopedics;  Laterality: Right;   KNEE ARTHROSCOPY  LEFT X3 (LAST ONE 2005)   KNEE ARTHROSCOPY  05/17/2011   Procedure: ARTHROSCOPY KNEE;  Surgeon: Carlisle Beers Rendall III;  Location:  [facility];  Service: Orthopedics;  Laterality: Right;  WITH MEDIAL MENISECTOMY AND removal of suprapatella fat lump   LEFT WRIST TENOSYNECTOMY W/ LEFT THUMB JOINT REPAIR  02-24-10   LUMBAR FUSION  11-30-10   L4 - 5   LUMBAR FUSION  2000   L2 - 4   LUMBAR LAMINECTOMY  DEC 2011   L4 - 5   POSTERIOR CERVICAL FUSION/FORAMINOTOMY N/A 05/17/2017   Procedure: Cervical five  to Thorastic one  Posterior cervical fusion with lateral mass screws/revision of prior instrumentation;  Surgeon: Maeola Harman, MD;  Location: [facility];  Service: Neurosurgery;  Laterality: N/A;  C5 to T1 Posterior cervical fusion with lateral mass screws/revision of prior instrumentation   TENDON REPAIR   JAN 2010   LEFT INDEX AND LONG FINGERS   THUMB SURGERY   04/07/2019   RIGHT THUMB   TUBAL LIGATION      Allergies  Allergen Reactions   Latex Itching and Rash   Morphine And Codeine Hives and Itching   Acetaminophen Nausea And Vomiting   Morphine Itching    Headaches    Aspirin Nausea And Vomiting    Can take coated asa   Biaxin [Clarithromycin] Nausea And Vomiting   Clarithromycin Diarrhea and Nausea And Vomiting   Codeine Itching and Nausea And Vomiting   Darvocet [Propoxyphene N-Acetaminophen] Itching and Nausea And Vomiting   Hydrocodone-Acetaminophen Itching and Nausea And Vomiting   Lortab [Hydrocodone-Acetaminophen] Itching and Nausea And Vomiting   Percocet [Oxycodone-Acetaminophen] Itching and Nausea And Vomiting   Tramadol Nausea And Vomiting    Immunization History  Administered Date(s) Administered   Fluad Quad(high Dose 65+) 04/01/2019   Influenza Split 04/19/2013, 03/18/2014   Influenza, High Dose Seasonal PF 04/25/2018   Influenza,inj,Quad PF,6+ Mos 05/04/2015, 02/27/2017   PFIZER(Purple Top)SARS-COV-2 Vaccination 03/06/2020, 03/25/2020   Pneumococcal Polysaccharide-23 04/19/2013   Zoster, Live 05/30/2013    Family History  Problem Relation Age of Onset   Hypertension Mother    Heart disease Mother        Mitral valve replacement.    Diabetes Mother    Liver cancer Father 38       HCC   Diabetes Brother    Lung cancer Brother 21       mets   Melanoma Brother 14       neck and back   Cancer Paternal Uncle        unknown type; dx after 33   Breast cancer Paternal Grandmother        dx 66s   Ovarian cancer Cousin        paternal cousin; d. 30s   Cancer Cousin        GYN; d. 29s   Colon cancer Neg Hx    Colon polyps Neg Hx    Esophageal cancer Neg Hx    Rectal cancer Neg Hx    Stomach cancer Neg Hx      Current Outpatient Medications:    albuterol (PROVENTIL) (2.5 MG/3ML) 0.083% nebulizer solution, Take 2.5 mg by nebulization every 4  (four) hours as needed for wheezing or shortness of breath., Disp: , Rfl:    albuterol (VENTOLIN HFA) 108 (90 Base) MCG/ACT inhaler, INHALE 2 PUFFS BY MOUTH EVERY 6 HOURS AS NEEDED FOR WHEEZING OR SHORTNESS OF BREATH, Disp: 8.5 g, Rfl: 4   aspirin EC 81 MG tablet, Take 81 mg by mouth at bedtime. , Disp: , Rfl:    Biotin 1000 MCG tablet, Take 1,000 mcg by  mouth daily., Disp: , Rfl:    budesonide-formoterol (SYMBICORT) 160-4.5 MCG/ACT inhaler, Inhale 2 puffs into the lungs 2 (two) times daily., Disp: 1 Inhaler, Rfl: 5   Calcium Carbonate-Vit D-Min 1200-1000 MG-UNIT CHEW, Chew 1 tablet by mouth daily., Disp: , Rfl:    Cyanocobalamin (VITAMIN B-12) 2500 MCG SUBL, Place 2,500 mcg under the tongue daily., Disp: , Rfl:    diltiazem (CARDIZEM CD) 240 MG 24 hr capsule, Take 240 mg by mouth daily., Disp: , Rfl:    gabapentin (NEURONTIN) 800 MG tablet, Take 1 tablet (800 mg total) by mouth 3 (three) times daily., Disp: 90 tablet, Rfl: 1   losartan-hydrochlorothiazide (HYZAAR) 50-12.5 MG tablet, Take 1 tablet by mouth daily., Disp: , Rfl:    metFORMIN (GLUCOPHAGE) 500 MG tablet, Take 500 mg by mouth daily with breakfast., Disp: , Rfl:    montelukast (SINGULAIR) 10 MG tablet, Take 10 mg by mouth daily., Disp: , Rfl:    naloxone (NARCAN) nasal spray 4 mg/0.1 mL, Place 1 spray into the nose once as needed (Just in case of Opiod Overdose)., Disp: , Rfl:    nortriptyline (PAMELOR) 25 MG capsule, Take 1 capsule (25 mg total) by mouth at bedtime. Three month supply, Disp: 90 capsule, Rfl: 1   omeprazole (PRILOSEC) 40 MG capsule, Take 40 mg by mouth daily., Disp: , Rfl:    oxyCODONE (ROXICODONE) 15 MG immediate release tablet, Take 1 tablet (15 mg total) by mouth 5 (five) times daily as needed for pain., Disp: 150 tablet, Rfl: 0   promethazine (PHENERGAN) 25 MG tablet, Take 25 mg by mouth as needed. , Disp: , Rfl:    simvastatin (ZOCOR) 10 MG tablet, Take 10 mg by mouth every evening., Disp: , Rfl:    topiramate  (TOPAMAX) 25 MG tablet, Take 1 tablet (25 mg total) by mouth 2 (two) times daily., Disp: 180 tablet, Rfl: 3   TRUE METRIX BLOOD GLUCOSE TEST test strip, daily., Disp: , Rfl:    valACYclovir (VALTREX) 500 MG tablet, Take 500 mg by mouth daily. , Disp: , Rfl:  No current facility-administered medications for this visit.  Facility-Administered Medications Ordered in Other Visits:    levalbuterol (XOPENEX) nebulizer solution 0.63 mg, 0.63 mg, Nebulization, Once, Parrett, Tammy S, NP      Objective:   Vitals:   02/06/23 1513  BP: (!) 130/90  Pulse: 81  SpO2: 99%  Weight: 166 lb 4.8 oz (75.4 kg)  Height: 5\' 5"  (1.651 m)    Estimated body mass index is 27.67 kg/m as calculated from the following:   Height as of this encounter: 5\' 5"  (1.651 m).   Weight as of this encounter: 166 lb 4.8 oz (75.4 kg).  @WEIGHTCHANGE @  [employer]   02/06/23 1513  Weight: 166 lb 4.8 oz (75.4 kg)     Physical Exam   General: No distress. Looks same O2 at rest: no Cane present: no Sitting in wheel chair: no Frail: no Obese: no Neuro: Alert and Oriented x 3. GCS 15. Speech normal Psych: Pleasant Resp:  Barrel Chest - no.  Wheeze - no, Crackles - no, No overt respiratory distress CVS: Normal heart sounds. Murmurs - no Ext: Stigmata of Connective Tissue Disease - no HEENT: Normal upper airway. PEERL +. No post nasal drip        Assessment:       ICD-10-CM   1. Acute bronchitis, unspecified organism  J20.9     2. Moderate persistent asthma without complication  J45.40  3. Multiple lung nodules  R91.8     4. Family history of cancer  Z80.9          Plan:     Patient Instructions  Moderate persistent asthma without complication Mild acute bronchitis 02/06/2023   Plan - Take doxycycline 100mg  po twice daily x 5 days; take after meals and avoid sunlight - Continue Symbicort any other medications as before - albuterol as needed -   Multiple lung nodules - last CT Dec  2021 Family history of cancer  - brother with recent diagnosis of lung cancer with mets to liver in 2022-02-13 and passed away 11-14-22- dad died of liver cancer - CT chest August 28, 2023without cancer  Plan  - supportive approach  Follow-up - 6  months or sooner if needed   FOLLOWUP Return in about 6 months (around 08/09/2023) for 15 min visit, with Dr Marchelle Gearing, Face to Face Visit.    SIGNATURE    Dr. Kalman Shan, M.D., F.C.C.P,  Pulmonary and Critical Care Medicine Staff Physician, [facility] Director - Interstitial Lung Disease  Program  Pulmonary Fibrosis [facility] Network at [facility] [place], [place], [address]  Pager: [phone], If no answer or between  15:00h - 7:00h: call [phone] Telephone: [phone]  4:05 PM 02/06/2023

## 2023-02-06 NOTE — Patient Instructions (Addendum)
Moderate persistent asthma without complication Mild acute bronchitis 02/06/2023   Plan - Take doxycycline 100mg  po twice daily x 5 days; take after meals and avoid sunlight - Continue Symbicort any other medications as before - albuterol as needed -   Multiple lung nodules - last CT Dec 2021 Family history of cancer  - brother with recent diagnosis of lung cancer with mets to liver in 03-12-22 and passed away 2022/12/11- dad died of liver cancer - CT chest 09/24/2023without cancer  Plan  - supportive approach  Follow-up - 6  months or sooner if needed

## 2023-02-08 LAB — TOXASSURE SELECT,+ANTIDEPR,UR

## 2023-02-12 ENCOUNTER — Encounter: Payer: Self-pay | Admitting: Internal Medicine

## 2023-02-12 NOTE — Telephone Encounter (Signed)
  Please take prednisone 40 mg x1 day, then 30 mg x1 day, then 20 mg x1 day, then 10 mg x1 day, and then 5 mg x1 day and stop   Take doxycycline 100mg  po twice daily x 5 days; take after meals and avoid sunlight   Pls make sure that she isok with the regimen first   Allergies  Allergen Reactions   Latex Itching and Rash   Morphine And Codeine Hives and Itching   Acetaminophen Nausea And Vomiting   Morphine Itching    Headaches    Aspirin Nausea And Vomiting    Can take coated asa   Biaxin [Clarithromycin] Nausea And Vomiting   Clarithromycin Diarrhea and Nausea And Vomiting   Codeine Itching and Nausea And Vomiting   Darvocet [Propoxyphene N-Acetaminophen] Itching and Nausea And Vomiting   Hydrocodone-Acetaminophen Itching and Nausea And Vomiting   Lortab [Hydrocodone-Acetaminophen] Itching and Nausea And Vomiting   Percocet [Oxycodone-Acetaminophen] Itching and Nausea And Vomiting   Tramadol Nausea And Vomiting

## 2023-02-13 NOTE — Telephone Encounter (Signed)
PT calling again. She would like to RX to go to [facility] in [place]

## 2023-02-15 ENCOUNTER — Other Ambulatory Visit: Payer: Self-pay

## 2023-02-15 DIAGNOSIS — E1143 Type 2 diabetes mellitus with diabetic autonomic (poly)neuropathy: Secondary | ICD-10-CM | POA: Diagnosis not present

## 2023-02-15 DIAGNOSIS — M818 Other osteoporosis without current pathological fracture: Secondary | ICD-10-CM | POA: Diagnosis not present

## 2023-02-15 DIAGNOSIS — E7849 Other hyperlipidemia: Secondary | ICD-10-CM | POA: Diagnosis not present

## 2023-02-15 DIAGNOSIS — I10 Essential (primary) hypertension: Secondary | ICD-10-CM | POA: Diagnosis not present

## 2023-02-15 MED ORDER — DOXYCYCLINE HYCLATE 100 MG PO TABS: 100.0000 mg | ORAL_TABLET | Freq: Two times a day (BID) | ORAL | 0 refills | Status: DC

## 2023-02-15 MED ORDER — PREDNISONE 5 MG PO TABS: ORAL_TABLET | ORAL | 0 refills | Status: DC

## 2023-02-15 NOTE — Telephone Encounter (Signed)
Patient calling again. [facility] at [place], [place]. Upping to urgent as she has been calling since Monday

## 2023-02-16 ENCOUNTER — Encounter: Payer: Self-pay | Admitting: Vascular Surgery

## 2023-03-06 ENCOUNTER — Encounter: Payer: Self-pay | Admitting: Registered Nurse

## 2023-03-06 ENCOUNTER — Encounter: Payer: Medicare Other | Attending: Physical Medicine & Rehabilitation | Admitting: Registered Nurse

## 2023-03-06 VITALS — BP 123/79 | HR 85 | Ht 65.0 in

## 2023-03-06 DIAGNOSIS — M961 Postlaminectomy syndrome, not elsewhere classified: Secondary | ICD-10-CM

## 2023-03-06 DIAGNOSIS — M25511 Pain in right shoulder: Secondary | ICD-10-CM | POA: Diagnosis not present

## 2023-03-06 DIAGNOSIS — M546 Pain in thoracic spine: Secondary | ICD-10-CM | POA: Insufficient documentation

## 2023-03-06 DIAGNOSIS — M5416 Radiculopathy, lumbar region: Secondary | ICD-10-CM

## 2023-03-06 DIAGNOSIS — G894 Chronic pain syndrome: Secondary | ICD-10-CM

## 2023-03-06 DIAGNOSIS — Z5181 Encounter for therapeutic drug level monitoring: Secondary | ICD-10-CM | POA: Diagnosis not present

## 2023-03-06 DIAGNOSIS — G8929 Other chronic pain: Secondary | ICD-10-CM

## 2023-03-06 DIAGNOSIS — M25512 Pain in left shoulder: Secondary | ICD-10-CM | POA: Diagnosis not present

## 2023-03-06 DIAGNOSIS — M5412 Radiculopathy, cervical region: Secondary | ICD-10-CM | POA: Diagnosis not present

## 2023-03-06 DIAGNOSIS — G039 Meningitis, unspecified: Secondary | ICD-10-CM

## 2023-03-06 DIAGNOSIS — Z79891 Long term (current) use of opiate analgesic: Secondary | ICD-10-CM

## 2023-03-06 DIAGNOSIS — M542 Cervicalgia: Secondary | ICD-10-CM | POA: Diagnosis not present

## 2023-03-06 MED ORDER — OXYCODONE HCL 15 MG PO TABS: 15.0000 mg | ORAL_TABLET | Freq: Every day | ORAL | 0 refills | Status: DC | PRN

## 2023-03-06 MED ORDER — GABAPENTIN 800 MG PO TABS: 800.0000 mg | ORAL_TABLET | Freq: Three times a day (TID) | ORAL | 3 refills | Status: DC

## 2023-03-06 MED ORDER — NORTRIPTYLINE HCL 25 MG PO CAPS: 25.0000 mg | ORAL_CAPSULE | Freq: Every day | ORAL | 1 refills | Status: DC

## 2023-03-06 MED ORDER — TOPIRAMATE 25 MG PO TABS: 25.0000 mg | ORAL_TABLET | Freq: Two times a day (BID) | ORAL | 3 refills | Status: DC

## 2023-03-06 NOTE — Progress Notes (Signed)
Subjective:    Patient ID: [name] [name], female    DOB: Jun 13, 1951, 72 y.o.   MRN: [id number]  HPI: [name] [name] is a 72 y.o. female who returns for follow up appointment for chronic pain and medication refill. She states her pain is located in her neck radiating into her bilateral shoulders and mid- lower back pain radiating into bilateral lower extremities. She rates her pain 7. Her current exercise regime is walking and performing stretching exercises.  [name] [name] Morphine equivalent is 112.50 MME.   Last UDS was Performed on 02/05/2023, it was consistent.      Pain Inventory Average Pain 7 Pain Right Now 7 My pain is sharp, burning, stabbing, and tingling  In the last 24 hours, has pain interfered with the following? General activity 7 Relation with others 7 Enjoyment of life 7 What TIME of day is your pain at its worst? morning , daytime, evening, and night Sleep (in general) Fair  Pain is worse with: walking, bending, sitting, inactivity, standing, and some activites Pain improves with: rest, pacing activities, medication, and heat Relief from Meds: 7  Family History  Problem Relation Age of Onset   Hypertension Mother    Heart disease Mother        Mitral valve replacement.    Diabetes Mother    Liver cancer Father 28       HCC   Diabetes Brother    Lung cancer Brother 79       mets   Melanoma Brother 16       neck and back   Cancer Paternal Uncle        unknown type; dx after 75   Breast cancer Paternal Grandmother        dx 83s   Ovarian cancer Cousin        paternal cousin; d. 30s   Cancer Cousin        GYN; d. 4s   Colon cancer Neg Hx    Colon polyps Neg Hx    Esophageal cancer Neg Hx    Rectal cancer Neg Hx    Stomach cancer Neg Hx    Social History   Socioeconomic History   Marital status: Divorced    Spouse name: Not on file   Number of children: Not on file   Years of education: Not on file   Highest education level: Not on file   Occupational History   Occupation: disable    Employer: DISABLED  Tobacco Use   Smoking status: Never   Smokeless tobacco: Never  Vaping Use   Vaping status: Never Used  Substance and Sexual Activity   Alcohol use: No   Drug use: No   Sexual activity: Not on file    Comment: second hand smoke  Other Topics Concern   Not on file  Social History Narrative   Has one child lives at home with her.    Social Determinants of Health   Financial Resource Strain: Not on file  Food Insecurity: Not on file  Transportation Needs: Not on file  Physical Activity: Not on file  Stress: Not on file  Social Connections: Not on file   Past Surgical History:  Procedure Laterality Date   ABDOMINAL HYSTERECTOMY  1987   W/ BSO   ANTERIOR CERVICAL DECOMP/DISCECTOMY FUSION N/A 08/09/2012   Procedure: ANTERIOR CERVICAL DECOMPRESSION/DISCECTOMY FUSION 1 LEVEL;  Surgeon: Karn Cassis, MD;  Location: [facility];  Service: Neurosurgery;  Laterality: N/A;  Cervical four-five  Anterior cervical decompression/diskectomy/fusion   ANTERIOR FUSION CERVICAL SPINE  2007   C5 -6   APPENDECTOMY     APPLICATION OF INTRAOPERATIVE CT SCAN N/A 05/17/2017   Procedure: APPLICATION OF INTRAOPERATIVE CT SCAN;  Surgeon: Maeola Harman, MD;  Location: [facility];  Service: Neurosurgery;  Laterality: N/A;   BACK SURGERY     x 5   BREAST EXCISIONAL BIOPSY Bilateral    No scar seen    BREAST SURGERY     x 2 biopsies   CARPAL TUNNEL RELEASE  RIGHT - 2001  & DEC 2011 W/ BACK SURG.   CERVICAL DISC SURGERY  2005   C5 - 6   CHOLECYSTECTOMY  1994   COLONOSCOPY WITH PROPOFOL N/A 05/16/2022   Procedure: COLONOSCOPY WITH PROPOFOL;  Surgeon: Dolores Frame, MD;  Location: [facility];  Service: Gastroenterology;  Laterality: N/A;  945 ASA 3   DORSAL COMPARTMENT RELEASE Right 04/07/2013   Procedure: RIGHT WRIST STS RELEASE;  Surgeon: Marlowe Shores, MD;  Location: [facility];  Service:  Orthopedics;  Laterality: Right;   ENDOVENOUS ABLATION SAPHENOUS VEIN W/ LASER Right 05/29/2019   endovenous laser ablation right greater saphenous vein by Cari Caraway MD    EXPLORATION OF INCISION FOR CSF LEAK  DEC 2011  X2   POST LAMINECOTMY   FINGER ARTHRODESIS Right 04/07/2013   Procedure: RIGHT INDEX AND RIGHT LONG DISTAL INTERPHALANGEAL JOINT FUSIONS;  Surgeon: Marlowe Shores, MD;  Location: [facility];  Service: Orthopedics;  Laterality: Right;   FINGER ARTHROPLASTY Right 04/07/2013   Procedure: RIGHT THUMB CMC ARTHROPLASTY;  Surgeon: Marlowe Shores, MD;  Location: [facility];  Service: Orthopedics;  Laterality: Right;   GANGLION CYST EXCISION Right 04/07/2013   Procedure: RIGHT WRIST MASS EXCISION;  Surgeon: Marlowe Shores, MD;  Location:  SURGERY CENTER;  Service: Orthopedics;  Laterality: Right;   KNEE ARTHROSCOPY  LEFT X3 (LAST ONE 2005)   KNEE ARTHROSCOPY  05/17/2011   Procedure: ARTHROSCOPY KNEE;  Surgeon: Curlene Labrum III;  Location: [facility];  Service: Orthopedics;  Laterality: Right;  WITH MEDIAL MENISECTOMY AND removal of suprapatella fat lump   LEFT WRIST TENOSYNECTOMY W/ LEFT THUMB JOINT REPAIR  02-24-10   LUMBAR FUSION  11-30-10   L4 - 5   LUMBAR FUSION  2000   L2 - 4   LUMBAR LAMINECTOMY  DEC 2011   L4 - 5   POSTERIOR CERVICAL FUSION/FORAMINOTOMY N/A 05/17/2017   Procedure: Cervical five  to Thorastic one  Posterior cervical fusion with lateral mass screws/revision of prior instrumentation;  Surgeon: Maeola Harman, MD;  Location: [facility];  Service: Neurosurgery;  Laterality: N/A;  C5 to T1 Posterior cervical fusion with lateral mass screws/revision of prior instrumentation   TENDON REPAIR  JAN 2010   LEFT INDEX AND LONG FINGERS   THUMB SURGERY   04/07/2019   RIGHT THUMB   TUBAL LIGATION     Past Surgical History:  Procedure Laterality Date   ABDOMINAL HYSTERECTOMY  1987   W/ BSO   ANTERIOR  CERVICAL DECOMP/DISCECTOMY FUSION N/A 08/09/2012   Procedure: ANTERIOR CERVICAL DECOMPRESSION/DISCECTOMY FUSION 1 LEVEL;  Surgeon: Karn Cassis, MD;  Location: [facility];  Service: Neurosurgery;  Laterality: N/A;  Cervical four-five  Anterior cervical decompression/diskectomy/fusion   ANTERIOR FUSION CERVICAL SPINE  2007   C5 -6   APPENDECTOMY     APPLICATION OF INTRAOPERATIVE CT SCAN N/A 05/17/2017  Procedure: APPLICATION OF INTRAOPERATIVE CT SCAN;  Surgeon: Maeola Harman, MD;  Location: [facility] OR;  Service: Neurosurgery;  Laterality: N/A;   BACK SURGERY     x 5   BREAST EXCISIONAL BIOPSY Bilateral    No scar seen    BREAST SURGERY     x 2 biopsies   CARPAL TUNNEL RELEASE  RIGHT - 2001  & DEC 2011 W/ BACK SURG.   CERVICAL DISC SURGERY  2005   C5 - 6   CHOLECYSTECTOMY  1994   COLONOSCOPY WITH PROPOFOL N/A 05/16/2022   Procedure: COLONOSCOPY WITH PROPOFOL;  Surgeon: Dolores Frame, MD;  [facility]: [facility];  Service: Gastroenterology;  Laterality: N/A;  945 ASA 3   DORSAL COMPARTMENT RELEASE Right 04/07/2013   Procedure: RIGHT WRIST STS RELEASE;  Surgeon: Marlowe Shores, MD;  Location: [facility];  Service: Orthopedics;  Laterality: Right;   ENDOVENOUS ABLATION SAPHENOUS VEIN W/ LASER Right 05/29/2019   endovenous laser ablation right greater saphenous vein by Cari Caraway MD    EXPLORATION OF INCISION FOR CSF LEAK  DEC 2011  X2   POST LAMINECOTMY   FINGER ARTHRODESIS Right 04/07/2013   Procedure: RIGHT INDEX AND RIGHT LONG DISTAL INTERPHALANGEAL JOINT FUSIONS;  Surgeon: Marlowe Shores, MD;  Location: [facility];  Service: Orthopedics;  Laterality: Right;   FINGER ARTHROPLASTY Right 04/07/2013   Procedure: RIGHT THUMB CMC ARTHROPLASTY;  Surgeon: Marlowe Shores, MD;  Location: [facility];  Service: Orthopedics;  Laterality: Right;   GANGLION CYST EXCISION Right 04/07/2013   Procedure: RIGHT WRIST MASS EXCISION;   Surgeon: Marlowe Shores, MD;  Location:  SURGERY CENTER;  Service: Orthopedics;  Laterality: Right;   KNEE ARTHROSCOPY  LEFT X3 (LAST ONE 2005)   KNEE ARTHROSCOPY  05/17/2011   Procedure: ARTHROSCOPY KNEE;  Surgeon: Curlene Labrum III;  Location: [facility];  Service: Orthopedics;  Laterality: Right;  WITH MEDIAL MENISECTOMY AND removal of suprapatella fat lump   LEFT WRIST TENOSYNECTOMY W/ LEFT THUMB JOINT REPAIR  02-24-10   LUMBAR FUSION  11-30-10   L4 - 5   LUMBAR FUSION  2000   L2 - 4   LUMBAR LAMINECTOMY  DEC 2011   L4 - 5   POSTERIOR CERVICAL FUSION/FORAMINOTOMY N/A 05/17/2017   Procedure: Cervical five  to Thorastic one  Posterior cervical fusion with lateral mass screws/revision of prior instrumentation;  Surgeon: Maeola Harman, MD;  Location: [facility];  Service: Neurosurgery;  Laterality: N/A;  C5 to T1 Posterior cervical fusion with lateral mass screws/revision of prior instrumentation   TENDON REPAIR  JAN 2010   LEFT INDEX AND LONG FINGERS   THUMB SURGERY   04/07/2019   RIGHT THUMB   TUBAL LIGATION     Past Medical History:  Diagnosis Date   Acute sinusitis, unspecified    Allergy    Anemia    "quite a few times"   Anxiety state, unspecified    Arachnoiditis BILATERAL LEGS   DUE TO MULTIPLE BACK SURG.'S   Arthritis    Asthma    last flare up was 03/2017 lasted over a month   Blood transfusion    Cancer (HCC)    skin cancers (in scalp)   Cardiomyopathy HX --06/2010   EF was 25% during acute illness (PHELONEPHRITIS) Repeat echo 12-06-10 60% showed normal EF.    Chronic back pain greater than 3 months duration    S/P BACK SURG'S   CSF leak  Diabetes mellitus without complication (HCC)    dx 2013 type 2   Dyslipidemia    Dysphagia    some post op cerv fusion 2/14   Dysrhythmia    Essential hypertension, benign    Family history of adverse reaction to anesthesia    mother gets n/v   Family history of breast cancer 02/06/2022   Family  history of liver cancer 02/06/2022   Family history of lung cancer 02/06/2022   Family history of ovarian cancer 02/06/2022   GERD (gastroesophageal reflux disease) AND HIATIAL HERNIA   CONTROLLED W/ NEXIUM   Headache(784.0)    History of chronic bronchitis    Hx of bladder infections    Hyperlipidemia    TAKE CHLOSTEROL MEDICATION,04/23/19   Hypertension    Neuromuscular disorder (HCC)    numbness and tingling   Osteoporosis    Other malaise and fatigue    PONV (postoperative nausea and vomiting)    Shortness of breath    Spinal headache    Spinal stenosis, cervical region    Varicosities    venous   Weakness of both legs DUE TO ARACHNOIDITIS   OCCASIONAL USES CANE   There were no vitals taken for this visit.  Opioid Risk Score:   Fall Risk Score:  `1  Depression screen PHQ 2/9     12/04/2022    1:27 PM 11/03/2022    1:19 PM 10/03/2022    1:11 PM 09/05/2022    1:38 PM 07/10/2022    1:22 PM 06/08/2022    1:14 PM 04/13/2022    1:35 PM  Depression screen PHQ 2/9  Decreased Interest 1 0 0 0 0 0 0  Down, Depressed, Hopeless 1 0 0 0 0 0 0  PHQ - 2 Score 2 0 0 0 0 0 0    Review of Systems  Musculoskeletal:  Positive for back pain, gait problem and neck pain.       Pain in both legs  All other systems reviewed and are negative.     Objective:   Physical Exam Vitals and nursing note reviewed.  Constitutional:      Appearance: Normal appearance.  Neck:     Comments: Cervical Paraspinal Tenderness: C-5-C-6 Cardiovascular:     Rate and Rhythm: Normal rate and regular rhythm.     Pulses: Normal pulses.     Heart sounds: Normal heart sounds.  Pulmonary:     Effort: Pulmonary effort is normal.     Breath sounds: Normal breath sounds.  Musculoskeletal:     Cervical back: Normal range of motion and neck supple.     Comments: Normal Muscle Bulk and Muscle Testing Reveals:  Upper Extremities:Full  ROM and Muscle Strength 5/5 Bilateral AC Joint Tenderness  Thoracic  Paraspinal Tenderness: T-1-T-7 Lumbar Paraspinal Tenderness: L-4-L-5 Lower Extremities: Full ROM and Muscle Strength 5/5 Arises from Table with ease Narrow Based  Gait     Skin:    General: Skin is warm and dry.  Neurological:     Mental Status: She is alert and oriented to person, place, and time.  Psychiatric:        Mood and Affect: Mood normal.        Behavior: Behavior normal.         Assessment & Plan:  1. On 05/17/2017 :C5C6C7 T1 Posterior Cervical Fusion with lateral mass fixation with AIR[facility] Imaging, revision of prior instrumentation. By Dr. Venetia Maxon.  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 Continue current  medication regimen with  Gabapentin 800 mg BID. Refilled: Oxycodone 15mg  one tablet 5 times a day  as needed for pain # 150 tablets We will continue the opioid monitoring program, this consists of regular clinic visits, examinations, urine drug screen, pill counts as well as use of [place] Controlled Substance Reporting system. 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 Continue to Monitor. 03/06/2023 3. Anxiety/depression: PCP Following. Continue to monitor. 09/172024. 4. Muscle Spasms: Continue current medication regime with Flexeril. Continue to Monitor. 03/06/2023 5. Cervicalgia/ Cervical Radiculitis: Dr Venetia Maxon Following. ,Continue current medication regime with Gabapentin:  S/P  C5C6C7 T1 Posterior Cervical Fusion with lateral mass fixation with AIRO Imaging, revision of prior instrumentation by Dr. Venetia Maxon on 05/17/2017. 03/06/2023 7. Bilateral Thoracic Back Pain: Continue HEP as Tolerated and Continue current medication regimen. Continue to Monitor. 03/06/2023  8. Left lower extremity DVT/ Phlebitis:  S/P endovenous laser ablation of left great  saphenous vein on 05/09/2018 by Dr. Edilia Bo:  Vascular  Following. 03/06/2023. 9. Insomnia: Continue Pamelor. Continue to Monitor. 03/06/2023.     F/U in 1 month

## 2023-03-19 DIAGNOSIS — E7849 Other hyperlipidemia: Secondary | ICD-10-CM | POA: Diagnosis not present

## 2023-03-19 DIAGNOSIS — E1143 Type 2 diabetes mellitus with diabetic autonomic (poly)neuropathy: Secondary | ICD-10-CM | POA: Diagnosis not present

## 2023-03-19 DIAGNOSIS — M2550 Pain in unspecified joint: Secondary | ICD-10-CM | POA: Diagnosis not present

## 2023-03-19 DIAGNOSIS — I10 Essential (primary) hypertension: Secondary | ICD-10-CM | POA: Diagnosis not present

## 2023-03-19 DIAGNOSIS — M818 Other osteoporosis without current pathological fracture: Secondary | ICD-10-CM | POA: Diagnosis not present

## 2023-03-22 DIAGNOSIS — E1143 Type 2 diabetes mellitus with diabetic autonomic (poly)neuropathy: Secondary | ICD-10-CM | POA: Diagnosis not present

## 2023-03-22 DIAGNOSIS — M818 Other osteoporosis without current pathological fracture: Secondary | ICD-10-CM | POA: Diagnosis not present

## 2023-03-22 DIAGNOSIS — I10 Essential (primary) hypertension: Secondary | ICD-10-CM | POA: Diagnosis not present

## 2023-03-22 DIAGNOSIS — E7849 Other hyperlipidemia: Secondary | ICD-10-CM | POA: Diagnosis not present

## 2023-04-05 NOTE — Progress Notes (Signed)
Subjective:    Patient ID: [name] [name], female    DOB: 1950/08/05, 72 y.o.   MRN: [id number]  HPI: [name] [name] is a 72 y.o. female who returns for follow up appointment for chronic pain and medication refill. She states her pain is located in her right shoulder, onset was Monday 04/02/2023, she denies falling. X-ray was ordered, she states she will go to [facility], she will send this provider a update via My-Chart message, she verbalizes understanding. She refuses steroid pak at this time will wait till she is seen by Ortho  She also reports pain in her neck radiating into her bilateral shoulders, mid- back and lower back pain radiating into her bilateral  lower extremities. She rates her pain 8. Her current exercise regime is walking and performing stretching exercises.  [name] [name] equivalent is 112.50  MME.   Last UDS was Performed on 02/05/2023, it was consistent.      Pain Inventory Average Pain 7 Pain Right Now 8 My pain is constant, sharp, burning, stabbing, tingling, and aching  In the last 24 hours, has pain interfered with the following? General activity 8 Relation with others 7 Enjoyment of life 7 What TIME of day is your pain at its worst? morning , daytime, evening, and night Sleep (in general) Fair  Pain is worse with: walking, bending, sitting, inactivity, standing, and some activites Pain improves with: pacing activities, medication, and heat Relief from Meds: 7  Family History  Problem Relation Age of Onset   Hypertension Mother    Heart disease Mother        Mitral valve replacement.    Diabetes Mother    Liver cancer Father 29       HCC   Diabetes Brother    Lung cancer Brother 72       mets   Melanoma Brother 50       neck and back   Cancer Paternal Uncle        unknown type; dx after 24   Breast cancer Paternal Grandmother        dx 68s   Ovarian cancer Cousin        paternal cousin; d. 30s   Cancer Cousin        GYN; d. 44s   Colon  cancer Neg Hx    Colon polyps Neg Hx    Esophageal cancer Neg Hx    Rectal cancer Neg Hx    Stomach cancer Neg Hx    Social History   Socioeconomic History   Marital status: Divorced    Spouse name: Not on file   Number of children: Not on file   Years of education: Not on file   Highest education level: Not on file  Occupational History   Occupation: disable    Employer: DISABLED  Tobacco Use   Smoking status: Never   Smokeless tobacco: Never  Vaping Use   Vaping status: Never Used  Substance and Sexual Activity   Alcohol use: No   Drug use: No   Sexual activity: Not on file    Comment: second hand smoke  Other Topics Concern   Not on file  Social History Narrative   Has one child lives at home with her.    Social Determinants of Health   Financial Resource Strain: Not on file  Food Insecurity: Not on file  Transportation Needs: Not on file  Physical Activity: Not on file  Stress: Not on file  Social Connections: Not on file   Past Surgical History:  Procedure Laterality Date   ABDOMINAL HYSTERECTOMY  1987   W/ BSO   ANTERIOR CERVICAL DECOMP/DISCECTOMY FUSION N/A 08/09/2012   Procedure: ANTERIOR CERVICAL DECOMPRESSION/DISCECTOMY FUSION 1 LEVEL;  Surgeon: Karn Cassis, MD;  Location: [facility];  Service: Neurosurgery;  Laterality: N/A;  Cervical four-five  Anterior cervical decompression/diskectomy/fusion   ANTERIOR FUSION CERVICAL SPINE  2007   C5 -6   APPENDECTOMY     APPLICATION OF INTRAOPERATIVE CT SCAN N/A 05/17/2017   Procedure: APPLICATION OF INTRAOPERATIVE CT SCAN;  Surgeon: Maeola Harman, MD;  Location: [facility] OR;  Service: Neurosurgery;  Laterality: N/A;   BACK SURGERY     x 5   BREAST EXCISIONAL BIOPSY Bilateral    No scar seen    BREAST SURGERY     x 2 biopsies   CARPAL TUNNEL RELEASE  RIGHT - 2001  & DEC 2011 W/ BACK SURG.   CERVICAL DISC SURGERY  2005   C5 - 6   CHOLECYSTECTOMY  1994   COLONOSCOPY WITH PROPOFOL N/A 05/16/2022   Procedure:  COLONOSCOPY WITH PROPOFOL;  Surgeon: Dolores Frame, MD;  [facility];  Service: Gastroenterology;  Laterality: N/A;  945 ASA 3   DORSAL COMPARTMENT RELEASE Right 04/07/2013   Procedure: RIGHT WRIST STS RELEASE;  Surgeon: Marlowe Shores, MD;  Location: [facility];  Service: Orthopedics;  Laterality: Right;   ENDOVENOUS ABLATION SAPHENOUS VEIN W/ LASER Right 05/29/2019   endovenous laser ablation right greater saphenous vein by Cari Caraway MD    EXPLORATION OF INCISION FOR CSF LEAK  DEC 2011  X2   POST LAMINECOTMY   FINGER ARTHRODESIS Right 04/07/2013   Procedure: RIGHT INDEX AND RIGHT LONG DISTAL INTERPHALANGEAL JOINT FUSIONS;  Surgeon: Marlowe Shores, MD;  Location: [facility];  Service: Orthopedics;  Laterality: Right;   FINGER ARTHROPLASTY Right 04/07/2013   Procedure: RIGHT THUMB CMC ARTHROPLASTY;  Surgeon: Marlowe Shores, MD;  Location: [facility];  Service: Orthopedics;  Laterality: Right;   GANGLION CYST EXCISION Right 04/07/2013   Procedure: RIGHT WRIST MASS EXCISION;  Surgeon: Marlowe Shores, MD;  Location: [facility];  Service: Orthopedics;  Laterality: Right;   KNEE ARTHROSCOPY  LEFT X3 (LAST ONE 2005)   KNEE ARTHROSCOPY  05/17/2011   Procedure: ARTHROSCOPY KNEE;  Surgeon: Curlene Labrum III;  Location: [facility];  Service: Orthopedics;  Laterality: Right;  WITH MEDIAL MENISECTOMY AND removal of suprapatella fat lump   LEFT WRIST TENOSYNECTOMY W/ LEFT THUMB JOINT REPAIR  02-24-10   LUMBAR FUSION  11-30-10   L4 - 5   LUMBAR FUSION  2000   L2 - 4   LUMBAR LAMINECTOMY  DEC 2011   L4 - 5   POSTERIOR CERVICAL FUSION/FORAMINOTOMY N/A 05/17/2017   Procedure: Cervical five  to Thorastic one  Posterior cervical fusion with lateral mass screws/revision of prior instrumentation;  Surgeon: Maeola Harman, MD;  Location: [facility] OR;  Service: Neurosurgery;  Laterality: N/A;  C5 to T1  Posterior cervical fusion with lateral mass screws/revision of prior instrumentation   TENDON REPAIR  JAN 2010   LEFT INDEX AND LONG FINGERS   THUMB SURGERY   04/07/2019   RIGHT THUMB   TUBAL LIGATION     Past Surgical History:  Procedure Laterality Date   ABDOMINAL HYSTERECTOMY  1987   W/ BSO   ANTERIOR CERVICAL DECOMP/DISCECTOMY FUSION N/A 08/09/2012  Procedure: ANTERIOR CERVICAL DECOMPRESSION/DISCECTOMY FUSION 1 LEVEL;  Surgeon: Karn Cassis, MD;  Location: [facility];  Service: Neurosurgery;  Laterality: N/A;  Cervical four-five  Anterior cervical decompression/diskectomy/fusion   ANTERIOR FUSION CERVICAL SPINE  2007   C5 -6   APPENDECTOMY     APPLICATION OF INTRAOPERATIVE CT SCAN N/A 05/17/2017   Procedure: APPLICATION OF INTRAOPERATIVE CT SCAN;  Surgeon: Maeola Harman, MD;  Location: [facility];  Service: Neurosurgery;  Laterality: N/A;   BACK SURGERY     x 5   BREAST EXCISIONAL BIOPSY Bilateral    No scar seen    BREAST SURGERY     x 2 biopsies   CARPAL TUNNEL RELEASE  RIGHT - 2001  & DEC 2011 W/ BACK SURG.   CERVICAL DISC SURGERY  2005   C5 - 6   CHOLECYSTECTOMY  1994   COLONOSCOPY WITH PROPOFOL N/A 05/16/2022   Procedure: COLONOSCOPY WITH PROPOFOL;  Surgeon: Dolores Frame, MD;  [facility];  Service: Gastroenterology;  Laterality: N/A;  945 ASA 3   DORSAL COMPARTMENT RELEASE Right 04/07/2013   Procedure: RIGHT WRIST STS RELEASE;  Surgeon: Marlowe Shores, MD;  Location: [facility];  Service: Orthopedics;  Laterality: Right;   ENDOVENOUS ABLATION SAPHENOUS VEIN W/ LASER Right 05/29/2019   endovenous laser ablation right greater saphenous vein by Cari Caraway MD    EXPLORATION OF INCISION FOR CSF LEAK  DEC 2011  X2   POST LAMINECOTMY   FINGER ARTHRODESIS Right 04/07/2013   Procedure: RIGHT INDEX AND RIGHT LONG DISTAL INTERPHALANGEAL JOINT FUSIONS;  Surgeon: Marlowe Shores, MD;  Location: [facility];  Service:  Orthopedics;  Laterality: Right;   FINGER ARTHROPLASTY Right 04/07/2013   Procedure: RIGHT THUMB CMC ARTHROPLASTY;  Surgeon: Marlowe Shores, MD;  Location: [facility];  Service: Orthopedics;  Laterality: Right;   GANGLION CYST EXCISION Right 04/07/2013   Procedure: RIGHT WRIST MASS EXCISION;  Surgeon: Marlowe Shores, MD;  Location: [facility];  Service: Orthopedics;  Laterality: Right;   KNEE ARTHROSCOPY  LEFT X3 (LAST ONE 2005)   KNEE ARTHROSCOPY  05/17/2011   Procedure: ARTHROSCOPY KNEE;  Surgeon: Curlene Labrum III;  Location: [facility];  Service: Orthopedics;  Laterality: Right;  WITH MEDIAL MENISECTOMY AND removal of suprapatella fat lump   LEFT WRIST TENOSYNECTOMY W/ LEFT THUMB JOINT REPAIR  02-24-10   LUMBAR FUSION  11-30-10   L4 - 5   LUMBAR FUSION  2000   L2 - 4   LUMBAR LAMINECTOMY  DEC 2011   L4 - 5   POSTERIOR CERVICAL FUSION/FORAMINOTOMY N/A 05/17/2017   Procedure: Cervical five  to Thorastic one  Posterior cervical fusion with lateral mass screws/revision of prior instrumentation;  Surgeon: Maeola Harman, MD;  Location: [facility];  Service: Neurosurgery;  Laterality: N/A;  C5 to T1 Posterior cervical fusion with lateral mass screws/revision of prior instrumentation   TENDON REPAIR  JAN 2010   LEFT INDEX AND LONG FINGERS   THUMB SURGERY   04/07/2019   RIGHT THUMB   TUBAL LIGATION     Past Medical History:  Diagnosis Date   Acute sinusitis, unspecified    Allergy    Anemia    "quite a few times"   Anxiety state, unspecified    Arachnoiditis BILATERAL LEGS   DUE TO MULTIPLE BACK SURG.'S   Arthritis    Asthma    last flare up was 03/2017 lasted over a  month   Blood transfusion    Cancer (HCC)    skin cancers (in scalp)   Cardiomyopathy HX --06/2010   EF was 25% during acute illness (PHELONEPHRITIS) Repeat echo 12-06-10 60% showed normal EF.    Chronic back pain greater than 3 months duration    S/P BACK SURG'S    CSF leak    Diabetes mellitus without complication (HCC)    dx 2013 type 2   Dyslipidemia    Dysphagia    some post op cerv fusion 2/14   Dysrhythmia    Essential hypertension, benign    Family history of adverse reaction to anesthesia    mother gets n/v   Family history of breast cancer 02/06/2022   Family history of liver cancer 02/06/2022   Family history of lung cancer 02/06/2022   Family history of ovarian cancer 02/06/2022   GERD (gastroesophageal reflux disease) AND HIATIAL HERNIA   CONTROLLED W/ NEXIUM   Headache(784.0)    History of chronic bronchitis    Hx of bladder infections    Hyperlipidemia    TAKE CHLOSTEROL MEDICATION,04/23/19   Hypertension    Neuromuscular disorder (HCC)    numbness and tingling   Osteoporosis    Other malaise and fatigue    PONV (postoperative nausea and vomiting)    Shortness of breath    Spinal headache    Spinal stenosis, cervical region    Varicosities    venous   Weakness of both legs DUE TO ARACHNOIDITIS   OCCASIONAL USES CANE   BP 129/82   Pulse 93   Ht 5\' 5"  (1.651 m)   Wt 165 lb (74.8 kg)   BMI 27.46 kg/m   Opioid Risk Score:   Fall Risk Score:  `1  Depression screen PHQ 2/9     03/06/2023    1:23 PM 12/04/2022    1:27 PM 11/03/2022    1:19 PM 10/03/2022    1:11 PM 09/05/2022    1:38 PM 07/10/2022    1:22 PM 06/08/2022    1:14 PM  Depression screen PHQ 2/9  Decreased Interest 0 1 0 0 0 0 0  Down, Depressed, Hopeless 0 1 0 0 0 0 0  PHQ - 2 Score 0 2 0 0 0 0 0    Review of Systems  Musculoskeletal:  Positive for back pain and gait problem.       Pain both legs, Right shoulder pain  All other systems reviewed and are negative.      Objective:   Physical Exam        Assessment & Plan:  1. On 05/17/2017 :C5C6C7 T1 Posterior Cervical Fusion with lateral mass fixation with AIRO Imaging, revision of prior instrumentation. By Dr. Venetia Maxon.  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  Continue current medication regimen with  Gabapentin 800 mg TID. Refilled: Oxycodone 15mg  one tablet 5 times a day  as needed for pain # 150 tablets We will continue the opioid monitoring program, this consists of regular clinic visits, examinations, urine drug screen, pill counts as well as use of [place] Controlled Substance Reporting system. 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 Continue to Monitor. 04/06/2023 3. Anxiety/depression: PCP Following. Continue to monitor. 10/182024. 4. Muscle Spasms: Continue current medication regime with Flexeril. Continue to Monitor. 04/06/2023 5. Cervicalgia/ Cervical Radiculitis: Dr Venetia Maxon Following. ,Continue  current medication regime with Gabapentin:  S/P  C5C6C7 T1 Posterior Cervical Fusion with lateral mass fixation with AIRO Imaging, revision of prior instrumentation by Dr. Venetia Maxon on 05/17/2017. 04/06/2023 7. Bilateral Thoracic Back Pain: Continue HEP as Tolerated and Continue current medication regimen. Continue to Monitor. 04/06/2023  8. Left lower extremity DVT/ Phlebitis:  S/P endovenous laser ablation of left great saphenous vein on 05/09/2018 by Dr. Edilia Bo:  Vascular  Following. 04/06/2023. 9. Insomnia: Continue Pamelor. Continue to Monitor. 04/06/2023. 10. Right Shoulder Pain / Right Shoulder Tendonitis: She refuses Medrol Dose Pak. She states she will go to [facility] today. RX: Right Shoulder X-ray. Ms. Rosenau will send a My-Chart message with update, after her Ortho visit . She verbalizes understanding.    F/U in 1 month

## 2023-04-06 ENCOUNTER — Encounter: Payer: Self-pay | Admitting: Registered Nurse

## 2023-04-06 ENCOUNTER — Encounter: Payer: Medicare Other | Attending: Physical Medicine & Rehabilitation | Admitting: Registered Nurse

## 2023-04-06 VITALS — BP 129/82 | HR 93 | Ht 65.0 in | Wt 165.0 lb

## 2023-04-06 DIAGNOSIS — Z5181 Encounter for therapeutic drug level monitoring: Secondary | ICD-10-CM | POA: Insufficient documentation

## 2023-04-06 DIAGNOSIS — M778 Other enthesopathies, not elsewhere classified: Secondary | ICD-10-CM | POA: Insufficient documentation

## 2023-04-06 DIAGNOSIS — M546 Pain in thoracic spine: Secondary | ICD-10-CM | POA: Insufficient documentation

## 2023-04-06 DIAGNOSIS — G039 Meningitis, unspecified: Secondary | ICD-10-CM | POA: Diagnosis not present

## 2023-04-06 DIAGNOSIS — Z79891 Long term (current) use of opiate analgesic: Secondary | ICD-10-CM | POA: Diagnosis not present

## 2023-04-06 DIAGNOSIS — G8929 Other chronic pain: Secondary | ICD-10-CM | POA: Insufficient documentation

## 2023-04-06 DIAGNOSIS — G894 Chronic pain syndrome: Secondary | ICD-10-CM | POA: Diagnosis not present

## 2023-04-06 DIAGNOSIS — M5416 Radiculopathy, lumbar region: Secondary | ICD-10-CM | POA: Diagnosis not present

## 2023-04-06 DIAGNOSIS — M5412 Radiculopathy, cervical region: Secondary | ICD-10-CM | POA: Insufficient documentation

## 2023-04-06 DIAGNOSIS — M25521 Pain in right elbow: Secondary | ICD-10-CM | POA: Diagnosis not present

## 2023-04-06 DIAGNOSIS — M542 Cervicalgia: Secondary | ICD-10-CM | POA: Insufficient documentation

## 2023-04-06 DIAGNOSIS — M25511 Pain in right shoulder: Secondary | ICD-10-CM | POA: Insufficient documentation

## 2023-04-06 DIAGNOSIS — M25512 Pain in left shoulder: Secondary | ICD-10-CM | POA: Diagnosis not present

## 2023-04-06 MED ORDER — GABAPENTIN 800 MG PO TABS: 800.0000 mg | ORAL_TABLET | Freq: Three times a day (TID) | ORAL | 1 refills | Status: DC

## 2023-04-06 MED ORDER — OXYCODONE HCL 15 MG PO TABS: 15.0000 mg | ORAL_TABLET | Freq: Every day | ORAL | 0 refills | Status: DC | PRN

## 2023-04-23 DIAGNOSIS — I10 Essential (primary) hypertension: Secondary | ICD-10-CM | POA: Diagnosis not present

## 2023-04-23 DIAGNOSIS — E1143 Type 2 diabetes mellitus with diabetic autonomic (poly)neuropathy: Secondary | ICD-10-CM | POA: Diagnosis not present

## 2023-04-23 DIAGNOSIS — E7849 Other hyperlipidemia: Secondary | ICD-10-CM | POA: Diagnosis not present

## 2023-04-23 DIAGNOSIS — M2550 Pain in unspecified joint: Secondary | ICD-10-CM | POA: Diagnosis not present

## 2023-04-23 DIAGNOSIS — M818 Other osteoporosis without current pathological fracture: Secondary | ICD-10-CM | POA: Diagnosis not present

## 2023-04-24 ENCOUNTER — Ambulatory Visit
Admission: RE | Admit: 2023-04-24 | Discharge: 2023-04-24 | Disposition: A | Discharge status: Home / Self Care | Payer: Medicare Other | Source: Ambulatory Visit | Attending: Internal Medicine | Admitting: Internal Medicine

## 2023-04-24 DIAGNOSIS — Z1231 Encounter for screening mammogram for malignant neoplasm of breast: Secondary | ICD-10-CM

## 2023-05-01 DIAGNOSIS — M25511 Pain in right shoulder: Secondary | ICD-10-CM | POA: Diagnosis not present

## 2023-05-03 NOTE — Progress Notes (Signed)
Subjective:    Patient ID: [name] [name], female    DOB: Jan 01, 1951, 72 y.o.   MRN: [id number]  HPI: [name] [name] is a 72 y.o. female who returns for follow up appointment for chronic pain and medication refill. She states her  pain is located in her neck radiating into her right shoulder, also reports right shoulder pain Ortho following and she is scheduled for MRI. She also reports mid-lower back pain radiating into her bilateral lower extremities. She rates her pain 8. Her current exercise regime is walking her dog 1/2 mile daily and performing stretching exercises.  [name] Morphine equivalent is 112.50 MME.   Last UDS was Performed on 02/05/2023, it was consistent.      Pain Inventory Average Pain 7 Pain Right Now 8 My pain is constant, sharp, burning, stabbing, and tingling  In the last 24 hours, has pain interfered with the following? General activity 7 Relation with others 7 Enjoyment of life 7 What TIME of day is your pain at its worst? morning , daytime, evening, and night Sleep (in general) Fair  Pain is worse with: walking, bending, sitting, inactivity, standing, and some activites Pain improves with: rest, heat/ice, and medication Relief from Meds: 7  Family History  Problem Relation Age of Onset   Hypertension Mother    Heart disease Mother        Mitral valve replacement.    Diabetes Mother    Liver cancer Father 25       HCC   Diabetes Brother    Lung cancer Brother 38       mets   Melanoma Brother 7       neck and back   Cancer Paternal Uncle        unknown type; dx after 102   Breast cancer Paternal Grandmother        dx 36s   Ovarian cancer Cousin        paternal cousin; d. 30s   Cancer Cousin        GYN; d. 51s   Colon cancer Neg Hx    Colon polyps Neg Hx    Esophageal cancer Neg Hx    Rectal cancer Neg Hx    Stomach cancer Neg Hx    Social History   Socioeconomic History   Marital status: Legally Separated    Spouse name: Not on file    Number of children: Not on file   Years of education: Not on file   Highest education level: Not on file  Occupational History   Occupation: disable    Employer: DISABLED  Tobacco Use   Smoking status: Never   Smokeless tobacco: Never  Vaping Use   Vaping status: Never Used  Substance and Sexual Activity   Alcohol use: No   Drug use: No   Sexual activity: Not on file    Comment: second hand smoke  Other Topics Concern   Not on file  Social History Narrative   Has one child lives at home with her.    Social Determinants of Health   Financial Resource Strain: Not on file  Food Insecurity: Not on file  Transportation Needs: Not on file  Physical Activity: Not on file  Stress: Not on file  Social Connections: Not on file   Past Surgical History:  Procedure Laterality Date   ABDOMINAL HYSTERECTOMY  1987   W/ BSO   ANTERIOR CERVICAL DECOMP/DISCECTOMY FUSION N/A 08/09/2012   Procedure: ANTERIOR CERVICAL DECOMPRESSION/DISCECTOMY  FUSION 1 LEVEL;  Surgeon: Karn Cassis, MD;  Location: [facility];  Service: Neurosurgery;  Laterality: N/A;  Cervical four-five  Anterior cervical decompression/diskectomy/fusion   ANTERIOR FUSION CERVICAL SPINE  2007   C5 -6   APPENDECTOMY     APPLICATION OF INTRAOPERATIVE CT SCAN N/A 05/17/2017   Procedure: APPLICATION OF INTRAOPERATIVE CT SCAN;  Surgeon: Maeola Harman, MD;  Location: [facility] OR;  Service: Neurosurgery;  Laterality: N/A;   BACK SURGERY     x 5   BREAST EXCISIONAL BIOPSY Bilateral    No scar seen    BREAST SURGERY     x 2 biopsies   CARPAL TUNNEL RELEASE  RIGHT - 2001  & DEC 2011 W/ BACK SURG.   CERVICAL DISC SURGERY  2005   C5 - 6   CHOLECYSTECTOMY  1994   COLONOSCOPY WITH PROPOFOL N/A 05/16/2022   Procedure: COLONOSCOPY WITH PROPOFOL;  Surgeon: Dolores Frame, MD;  [facility];  Service: Gastroenterology;  Laterality: N/A;  945 ASA 3   DORSAL COMPARTMENT RELEASE Right 04/07/2013   Procedure: RIGHT  WRIST STS RELEASE;  Surgeon: Marlowe Shores, MD;  Location: [facility];  Service: Orthopedics;  Laterality: Right;   ENDOVENOUS ABLATION SAPHENOUS VEIN W/ LASER Right 05/29/2019   endovenous laser ablation right greater saphenous vein by Cari Caraway MD    EXPLORATION OF INCISION FOR CSF LEAK  DEC 2011  X2   POST LAMINECOTMY   FINGER ARTHRODESIS Right 04/07/2013   Procedure: RIGHT INDEX AND RIGHT LONG DISTAL INTERPHALANGEAL JOINT FUSIONS;  Surgeon: Marlowe Shores, MD;  Location: [facility];  Service: Orthopedics;  Laterality: Right;   FINGER ARTHROPLASTY Right 04/07/2013   Procedure: RIGHT THUMB CMC ARTHROPLASTY;  Surgeon: Marlowe Shores, MD;  Location: [facility];  Service: Orthopedics;  Laterality: Right;   GANGLION CYST EXCISION Right 04/07/2013   Procedure: RIGHT WRIST MASS EXCISION;  Surgeon: Marlowe Shores, MD;  Location: [facility];  Service: Orthopedics;  Laterality: Right;   KNEE ARTHROSCOPY  LEFT X3 (LAST ONE 2005)   KNEE ARTHROSCOPY  05/17/2011   Procedure: ARTHROSCOPY KNEE;  Surgeon: Curlene Labrum III;  Location: [facility];  Service: Orthopedics;  Laterality: Right;  WITH MEDIAL MENISECTOMY AND removal of suprapatella fat lump   LEFT WRIST TENOSYNECTOMY W/ LEFT THUMB JOINT REPAIR  02-24-10   LUMBAR FUSION  11-30-10   L4 - 5   LUMBAR FUSION  2000   L2 - 4   LUMBAR LAMINECTOMY  DEC 2011   L4 - 5   POSTERIOR CERVICAL FUSION/FORAMINOTOMY N/A 05/17/2017   Procedure: Cervical five  to Thorastic one  Posterior cervical fusion with lateral mass screws/revision of prior instrumentation;  Surgeon: Maeola Harman, MD;  Location[facility];  Service: Neurosurgery;  Laterality: N/A;  C5 to T1 Posterior cervical fusion with lateral mass screws/revision of prior instrumentation   TENDON REPAIR  JAN 2010   LEFT INDEX AND LONG FINGERS   THUMB SURGERY   04/07/2019   RIGHT THUMB   TUBAL LIGATION     Past  Surgical History:  Procedure Laterality Date   ABDOMINAL HYSTERECTOMY  1987   W/ BSO   ANTERIOR CERVICAL DECOMP/DISCECTOMY FUSION N/A 08/09/2012   Procedure: ANTERIOR CERVICAL DECOMPRESSION/DISCECTOMY FUSION 1 LEVEL;  Surgeon: Karn Cassis, MD;  Location: [facility];  Service: Neurosurgery;  Laterality: N/A;  Cervical four-five  Anterior cervical decompression/diskectomy/fusion   ANTERIOR FUSION CERVICAL SPINE  2007   C5 -6   APPENDECTOMY     APPLICATION OF INTRAOPERATIVE CT SCAN N/A 05/17/2017   Procedure: APPLICATION OF INTRAOPERATIVE CT SCAN;  Surgeon: Maeola Harman, MD;  Location: [facility] OR;  Service: Neurosurgery;  Laterality: N/A;   BACK SURGERY     x 5   BREAST EXCISIONAL BIOPSY Bilateral    No scar seen    BREAST SURGERY     x 2 biopsies   CARPAL TUNNEL RELEASE  RIGHT - 2001  & DEC 2011 W/ BACK SURG.   CERVICAL DISC SURGERY  2005   C5 - 6   CHOLECYSTECTOMY  1994   COLONOSCOPY WITH PROPOFOL N/A 05/16/2022   Procedure: COLONOSCOPY WITH PROPOFOL;  Surgeon: Dolores Frame, MD;  [facility];  Service: Gastroenterology;  Laterality: N/A;  945 ASA 3   DORSAL COMPARTMENT RELEASE Right 04/07/2013   Procedure: RIGHT WRIST STS RELEASE;  Surgeon: Marlowe Shores, MD;  Location: [facility];  Service: Orthopedics;  Laterality: Right;   ENDOVENOUS ABLATION SAPHENOUS VEIN W/ LASER Right 05/29/2019   endovenous laser ablation right greater saphenous vein by Cari Caraway MD    EXPLORATION OF INCISION FOR CSF LEAK  DEC 2011  X2   POST LAMINECOTMY   FINGER ARTHRODESIS Right 04/07/2013   Procedure: RIGHT INDEX AND RIGHT LONG DISTAL INTERPHALANGEAL JOINT FUSIONS;  Surgeon: Marlowe Shores, MD;  Location: [facility];  Service: Orthopedics;  Laterality: Right;   FINGER ARTHROPLASTY Right 04/07/2013   Procedure: RIGHT THUMB CMC ARTHROPLASTY;  Surgeon: Marlowe Shores, MD;  Location: [facility];  Service: Orthopedics;   Laterality: Right;   GANGLION CYST EXCISION Right 04/07/2013   Procedure: RIGHT WRIST MASS EXCISION;  Surgeon: Marlowe Shores, MD;  Location: [facility];  Service: Orthopedics;  Laterality: Right;   KNEE ARTHROSCOPY  LEFT X3 (LAST ONE 2005)   KNEE ARTHROSCOPY  05/17/2011   Procedure: ARTHROSCOPY KNEE;  Surgeon: Curlene Labrum III;  Location: [facility];  Service: Orthopedics;  Laterality: Right;  WITH MEDIAL MENISECTOMY AND removal of suprapatella fat lump   LEFT WRIST TENOSYNECTOMY W/ LEFT THUMB JOINT REPAIR  02-24-10   LUMBAR FUSION  11-30-10   L4 - 5   LUMBAR FUSION  2000   L2 - 4   LUMBAR LAMINECTOMY  DEC 2011   L4 - 5   POSTERIOR CERVICAL FUSION/FORAMINOTOMY N/A 05/17/2017   Procedure: Cervical five  to Thorastic one  Posterior cervical fusion with lateral mass screws/revision of prior instrumentation;  Surgeon: Maeola Harman, MD;  Location: [facility] OR;  Service: Neurosurgery;  Laterality: N/A;  C5 to T1 Posterior cervical fusion with lateral mass screws/revision of prior instrumentation   TENDON REPAIR  JAN 2010   LEFT INDEX AND LONG FINGERS   THUMB SURGERY   04/07/2019   RIGHT THUMB   TUBAL LIGATION     Past Medical History:  Diagnosis Date   Acute sinusitis, unspecified    Allergy    Anemia    "quite a few times"   Anxiety state, unspecified    Arachnoiditis BILATERAL LEGS   DUE TO MULTIPLE BACK SURG.'S   Arthritis    Asthma    last flare up was 03/2017 lasted over a month   Blood transfusion    Cancer (HCC)    skin cancers (in scalp)   Cardiomyopathy HX --06/2010   EF was 25% during acute illness (PHELONEPHRITIS) Repeat echo 12-06-10 60% showed normal EF.  Chronic back pain greater than 3 months duration    S/P BACK SURG'S   CSF leak    Diabetes mellitus without complication (HCC)    dx 2013 type 2   Dyslipidemia    Dysphagia    some post op cerv fusion 2/14   Dysrhythmia    Essential hypertension, benign    Family history of  adverse reaction to anesthesia    mother gets n/v   Family history of breast cancer 02/06/2022   Family history of liver cancer 02/06/2022   Family history of lung cancer 02/06/2022   Family history of ovarian cancer 02/06/2022   GERD (gastroesophageal reflux disease) AND HIATIAL HERNIA   CONTROLLED W/ NEXIUM   Headache(784.0)    History of chronic bronchitis    Hx of bladder infections    Hyperlipidemia    TAKE CHLOSTEROL MEDICATION,04/23/19   Hypertension    Neuromuscular disorder (HCC)    numbness and tingling   Osteoporosis    Other malaise and fatigue    PONV (postoperative nausea and vomiting)    Shortness of breath    Spinal headache    Spinal stenosis, cervical region    Varicosities    venous   Weakness of both legs DUE TO ARACHNOIDITIS   OCCASIONAL USES CANE   There were no vitals taken for this visit.  Opioid Risk Score:   Fall Risk Score:  `1  Depression screen PHQ 2/9     04/06/2023    1:30 PM 03/06/2023    1:23 PM 12/04/2022    1:27 PM 11/03/2022    1:19 PM 10/03/2022    1:11 PM 09/05/2022    1:38 PM 07/10/2022    1:22 PM  Depression screen PHQ 2/9  Decreased Interest 0 0 1 0 0 0 0  Down, Depressed, Hopeless 0 0 1 0 0 0 0  PHQ - 2 Score 0 0 2 0 0 0 0    Review of Systems  Musculoskeletal:  Positive for back pain and neck pain.       Pain in both legs, right shoulder pain   Neurological:  Positive for headaches.      Objective:   Physical Exam Vitals and nursing note reviewed.  Constitutional:      Appearance: Normal appearance.  Neck:     Comments: Cervical Paraspinal Tenderness: C-5-C-6  Cardiovascular:     Rate and Rhythm: Normal rate and regular rhythm.     Pulses: Normal pulses.     Heart sounds: Normal heart sounds.  Pulmonary:     Effort: Pulmonary effort is normal.     Breath sounds: Normal breath sounds.  Musculoskeletal:     Comments: Normal Muscle Bulk and Muscle Testing Reveals:  Upper Extremities: Right: Decreased ROM 30 Degrees  and Muscle Strength 5/5 Right AC Joint Tenderness Left Upper Extremity: Full ROM and Muscle Strength 5/5  Thoracic Paraspinal Tenderness: T-7-T-9 Lumbar Paraspinal Tenderness: L-3-L-5 Lower Extremities: Full ROM and Muscle Strength 5/5 Arises from Table with Ease Narrow Based  Gait     Skin:    General: Skin is warm and dry.  Neurological:     Mental Status: She is alert and oriented to person, place, and time.  Psychiatric:        Mood and Affect: Mood normal.        Behavior: Behavior normal.         Assessment & Plan:  1. On 05/17/2017 :C5C6C7 T1 Posterior Cervical Fusion with lateral mass fixation with  [facility], revision of prior instrumentation. By Dr. Venetia Maxon.  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 Continue current medication regimen with  Gabapentin 800 mg TID. Refilled: Oxycodone 15mg  one tablet 5 times a day  as needed for pain # 150 tablets We will continue the opioid monitoring program, this consists of regular clinic visits, examinations, urine drug screen, pill counts as well as use of [place] Controlled Substance Reporting system. 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 Continue to Monitor. 05/04/2023 3. Anxiety/depression: PCP Following. Continue to monitor. 11/152024. 4. Muscle Spasms: Continue current medication regime with Flexeril. Continue to Monitor. 05/04/2023 5. Cervicalgia/ Cervical Radiculitis: Dr Venetia Maxon Following. ,Continue current medication regime with Gabapentin:  S/P  C5C6C7 T1 Posterior Cervical Fusion with lateral mass fixation with [facility], revision of prior instrumentation by Dr. Venetia Maxon on 05/17/2017. 05/04/2023 7. Bilateral Thoracic Back Pain: Continue HEP as Tolerated and Continue current medication regimen.  Continue to Monitor. 05/04/2023  8. Left lower extremity DVT/ Phlebitis:  S/P endovenous laser ablation of left great saphenous vein on 05/09/2018 by Dr. Edilia Bo:  Vascular  Following. 05/04/2023. 9. Insomnia: Continue Pamelor. Continue to Monitor. 05/04/2023. 10. Acute Right Shoulder Pain / Right Shoulder Tendonitis: [facility] Following. She is scheduled for MRI. Continue to monitor.   F/U in 1 month

## 2023-05-04 ENCOUNTER — Encounter: Payer: Medicare Other | Attending: Physical Medicine & Rehabilitation | Admitting: Registered Nurse

## 2023-05-04 ENCOUNTER — Encounter: Payer: Self-pay | Admitting: Registered Nurse

## 2023-05-04 VITALS — BP 153/87 | HR 75 | Ht 65.0 in | Wt 116.0 lb

## 2023-05-04 DIAGNOSIS — M5412 Radiculopathy, cervical region: Secondary | ICD-10-CM | POA: Diagnosis not present

## 2023-05-04 DIAGNOSIS — G8929 Other chronic pain: Secondary | ICD-10-CM | POA: Insufficient documentation

## 2023-05-04 DIAGNOSIS — M546 Pain in thoracic spine: Secondary | ICD-10-CM | POA: Insufficient documentation

## 2023-05-04 DIAGNOSIS — G894 Chronic pain syndrome: Secondary | ICD-10-CM | POA: Insufficient documentation

## 2023-05-04 DIAGNOSIS — G039 Meningitis, unspecified: Secondary | ICD-10-CM | POA: Insufficient documentation

## 2023-05-04 DIAGNOSIS — M542 Cervicalgia: Secondary | ICD-10-CM | POA: Diagnosis not present

## 2023-05-04 DIAGNOSIS — Z5181 Encounter for therapeutic drug level monitoring: Secondary | ICD-10-CM | POA: Insufficient documentation

## 2023-05-04 DIAGNOSIS — M25511 Pain in right shoulder: Secondary | ICD-10-CM | POA: Diagnosis not present

## 2023-05-04 DIAGNOSIS — M5416 Radiculopathy, lumbar region: Secondary | ICD-10-CM | POA: Insufficient documentation

## 2023-05-04 MED ORDER — OXYCODONE HCL 15 MG PO TABS: 15.0000 mg | ORAL_TABLET | Freq: Every day | ORAL | 0 refills | Status: DC | PRN

## 2023-05-28 DIAGNOSIS — M2550 Pain in unspecified joint: Secondary | ICD-10-CM | POA: Diagnosis not present

## 2023-05-28 DIAGNOSIS — E1143 Type 2 diabetes mellitus with diabetic autonomic (poly)neuropathy: Secondary | ICD-10-CM | POA: Diagnosis not present

## 2023-05-28 DIAGNOSIS — I10 Essential (primary) hypertension: Secondary | ICD-10-CM | POA: Diagnosis not present

## 2023-05-28 DIAGNOSIS — E7849 Other hyperlipidemia: Secondary | ICD-10-CM | POA: Diagnosis not present

## 2023-05-28 DIAGNOSIS — M818 Other osteoporosis without current pathological fracture: Secondary | ICD-10-CM | POA: Diagnosis not present

## 2023-05-30 DIAGNOSIS — M25511 Pain in right shoulder: Secondary | ICD-10-CM | POA: Diagnosis not present

## 2023-05-31 NOTE — Progress Notes (Signed)
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 need for scan but her brother had lung cancer and she came to a shared understanding with him that she would be very aggressive for lung cancer screening.  Therefore she wants this CT scan of the chest.  She had agreed for 10-month time point which would be February 2025.  Agreed to order CT scan of the chest without contrast.  PFT      No data to display            LAB RESULTS last 96 hours No results found.  LAB RESULTS last 90 days No results found for this or any previous visit (from the past 2160 hours).       has a past medical history of Acute sinusitis, unspecified, Allergy, Anemia, Anxiety state, unspecified, Arachnoiditis (BILATERAL LEGS), Arthritis, Asthma, Blood transfusion, Cancer (HCC), Cardiomyopathy (HX --06/2010), Chronic back pain greater than 3 months duration, CSF leak, Diabetes mellitus without complication (HCC), Dyslipidemia, Dysphagia, Dysrhythmia,  Essential hypertension, benign, Family history of adverse reaction to anesthesia, Family history of breast cancer (02/06/2022), Family history of liver cancer (02/06/2022), Family history of lung cancer (02/06/2022), Family history of ovarian cancer (02/06/2022), GERD (gastroesophageal reflux disease) (AND HIATIAL HERNIA), Headache(784.0), History of chronic bronchitis, bladder infections, Hyperlipidemia, Hypertension, Neuromuscular disorder (HCC), Osteoporosis, Other malaise and fatigue, PONV (postoperative nausea and vomiting), Shortness of breath, Spinal headache, Spinal stenosis, cervical region, Varicosities, and Weakness of both legs (DUE TO ARACHNOIDITIS).   reports that she has never smoked. She has never used smokeless tobacco.  Past Surgical History:  Procedure Laterality Date   ABDOMINAL HYSTERECTOMY  1987   W/ BSO   ANTERIOR CERVICAL DECOMP/DISCECTOMY FUSION N/A 08/09/2012   Procedure: ANTERIOR CERVICAL DECOMPRESSION/DISCECTOMY FUSION 1 LEVEL;  Surgeon: Karn Cassis, MD;  Location: [facility];  Service: Neurosurgery;  Laterality: N/A;  Cervical four-five  Anterior cervical decompression/diskectomy/fusion   ANTERIOR FUSION CERVICAL SPINE  2007   C5 -6   APPENDECTOMY     APPLICATION OF INTRAOPERATIVE CT SCAN N/A 05/17/2017  Procedure: APPLICATION OF INTRAOPERATIVE CT SCAN;  Surgeon: Maeola Harman, MD;  Location: [facility] OR;  Service: Neurosurgery;  Laterality: N/A;   BACK SURGERY     x 5   BREAST EXCISIONAL BIOPSY Bilateral    No scar seen    BREAST SURGERY     x 2 biopsies   CARPAL TUNNEL RELEASE  RIGHT - 2001  & DEC 2011 W/ BACK SURG.   CERVICAL DISC SURGERY  2005   C5 - 6   CHOLECYSTECTOMY  1994   COLONOSCOPY WITH PROPOFOL N/A 05/16/2022   Procedure: COLONOSCOPY WITH PROPOFOL;  Surgeon: Dolores Frame, MD;  Location: [facility];  Service: Gastroenterology;  Laterality: N/A;  945 ASA 3   DORSAL COMPARTMENT RELEASE Right 04/07/2013   Procedure: RIGHT WRIST STS  RELEASE;  Surgeon: Marlowe Shores, MD;  Location: [facility];  Service: Orthopedics;  Laterality: Right;   ENDOVENOUS ABLATION SAPHENOUS VEIN W/ LASER Right 05/29/2019   endovenous laser ablation right greater saphenous vein by Cari Caraway MD    EXPLORATION OF INCISION FOR CSF LEAK  DEC 2011  X2   POST LAMINECOTMY   FINGER ARTHRODESIS Right 04/07/2013   Procedure: RIGHT INDEX AND RIGHT LONG DISTAL INTERPHALANGEAL JOINT FUSIONS;  Surgeon: Marlowe Shores, MD;  Location: [facility];  Service: Orthopedics;  Laterality: Right;   FINGER ARTHROPLASTY Right 04/07/2013   Procedure: RIGHT THUMB CMC ARTHROPLASTY;  Surgeon: Marlowe Shores, MD;  Location: [facility];  Service: Orthopedics;  Laterality: Right;   GANGLION CYST EXCISION Right 04/07/2013   Procedure: RIGHT WRIST MASS EXCISION;  Surgeon: Marlowe Shores, MD;  Location: [facility];  Service: Orthopedics;  Laterality: Right;   KNEE ARTHROSCOPY  LEFT X3 (LAST ONE 2005)   KNEE ARTHROSCOPY  05/17/2011   Procedure: ARTHROSCOPY KNEE;  Surgeon: Curlene Labrum III;  Location: [facility];  Service: Orthopedics;  Laterality: Right;  WITH MEDIAL MENISECTOMY AND removal of suprapatella fat lump   LEFT WRIST TENOSYNECTOMY W/ LEFT THUMB JOINT REPAIR  02-24-10   LUMBAR FUSION  11-30-10   L4 - 5   LUMBAR FUSION  2000   L2 - 4   LUMBAR LAMINECTOMY  DEC 2011   L4 - 5   POSTERIOR CERVICAL FUSION/FORAMINOTOMY N/A 05/17/2017   Procedure: Cervical five  to Thorastic one  Posterior cervical fusion with lateral mass screws/revision of prior instrumentation;  Surgeon: Maeola Harman, MD;  Location: [facility] OR;  Service: Neurosurgery;  Laterality: N/A;  C5 to T1 Posterior cervical fusion with lateral mass screws/revision of prior instrumentation   TENDON REPAIR  JAN 2010   LEFT INDEX AND LONG FINGERS   THUMB SURGERY   04/07/2019   RIGHT THUMB   TUBAL LIGATION      Allergies   Allergen Reactions   Latex Itching and Rash   Morphine And Codeine Hives and Itching   Acetaminophen Nausea And Vomiting    Other Reaction(s): GI Intolerance   Morphine Itching    Headaches    Oxycodone Hcl    Aspirin Nausea And Vomiting    Can take coated asa  Other Reaction(s): GI Intolerance   Clarithromycin Diarrhea and Nausea And Vomiting   Clarithromycin Nausea And Vomiting    Other Reaction(s): GI Intolerance   Codeine Itching and Nausea And Vomiting   Darvocet [Propoxyphene N-Acetaminophen] Itching and Nausea And Vomiting   Hydrocodone-Acetaminophen Itching and Nausea And Vomiting   Lortab [Hydrocodone-Acetaminophen] Itching and Nausea And Vomiting  Percocet [Oxycodone-Acetaminophen] Itching and Nausea And Vomiting   Tramadol Nausea And Vomiting    Immunization History  Administered Date(s) Administered   Fluad Quad(high Dose 65+) 04/01/2019   Influenza Split 04/19/2013, 03/18/2014   Influenza, High Dose Seasonal PF 04/25/2018   Influenza,inj,Quad PF,6+ Mos 05/04/2015, 02/27/2017   PFIZER(Purple Top)SARS-COV-2 Vaccination 03/06/2020, 03/25/2020   Pneumococcal Polysaccharide-23 04/19/2013   Zoster, Live 05/30/2013    Family History  Problem Relation Age of Onset   Hypertension Mother    Heart disease Mother        Mitral valve replacement.    Diabetes Mother    Liver cancer Father 35       HCC   Diabetes Brother    Lung cancer Brother 86       mets   Melanoma Brother 66       neck and back   Cancer Paternal Uncle        unknown type; dx after 52   Breast cancer Paternal Grandmother        dx 50s   Ovarian cancer Cousin        paternal cousin; d. 30s   Cancer Cousin        GYN; d. 82s   Colon cancer Neg Hx    Colon polyps Neg Hx    Esophageal cancer Neg Hx    Rectal cancer Neg Hx    Stomach cancer Neg Hx      Current Outpatient Medications:    albuterol (PROVENTIL) (2.5 MG/3ML) 0.083% nebulizer solution, Take 3 mLs (2.5 mg total) by  nebulization every 6 (six) hours as needed for wheezing or shortness of breath., Disp: 75 mL, Rfl: 12   albuterol (PROVENTIL) (2.5 MG/3ML) 0.083% nebulizer solution, Take 2.5 mg by nebulization every 4 (four) hours as needed for wheezing or shortness of breath., Disp: , Rfl:    albuterol (VENTOLIN HFA) 108 (90 Base) MCG/ACT inhaler, INHALE 2 PUFFS BY MOUTH EVERY 6 HOURS AS NEEDED FOR WHEEZING OR SHORTNESS OF BREATH, Disp: 8.5 g, Rfl: 4   aspirin EC 81 MG tablet, Take 81 mg by mouth at bedtime. , Disp: , Rfl:    Biotin 1000 MCG tablet, Take 1,000 mcg by mouth daily., Disp: , Rfl:    budesonide-formoterol (SYMBICORT) 160-4.5 MCG/ACT inhaler, Inhale 2 puffs into the lungs 2 (two) times daily., Disp: 1 Inhaler, Rfl: 5   Calcium Carbonate-Vit D-Min 1200-1000 MG-UNIT CHEW, Chew 1 tablet by mouth daily., Disp: , Rfl:    Cyanocobalamin (VITAMIN B-12) 2500 MCG SUBL, Place 2,500 mcg under the tongue daily., Disp: , Rfl:    diltiazem (CARDIZEM CD) 240 MG 24 hr capsule, Take 240 mg by mouth daily., Disp: , Rfl:    gabapentin (NEURONTIN) 800 MG tablet, Take 1 tablet (800 mg total) by mouth 3 (three) times daily., Disp: 270 tablet, Rfl: 1   losartan-hydrochlorothiazide (HYZAAR) 50-12.5 MG tablet, Take 1 tablet by mouth daily., Disp: , Rfl:    metFORMIN (GLUCOPHAGE) 500 MG tablet, Take 500 mg by mouth daily with breakfast., Disp: , Rfl:    montelukast (SINGULAIR) 10 MG tablet, Take 10 mg by mouth daily., Disp: , Rfl:    naloxone (NARCAN) nasal spray 4 mg/0.1 mL, Place 1 spray into the nose once as needed (Just in case of Opiod Overdose)., Disp: , Rfl:    nortriptyline (PAMELOR) 25 MG capsule, Take 1 capsule (25 mg total) by mouth at bedtime. Three month supply, Disp: 90 capsule, Rfl: 1   omeprazole (PRILOSEC) 40 MG  capsule, Take 40 mg by mouth daily., Disp: , Rfl:    oxyCODONE (ROXICODONE) 15 MG immediate release tablet, Take 1 tablet (15 mg total) by mouth 5 (five) times daily as needed for pain., Disp: 150  tablet, Rfl: 0   promethazine (PHENERGAN) 25 MG tablet, Take 25 mg by mouth as needed. , Disp: , Rfl:    simvastatin (ZOCOR) 10 MG tablet, Take 10 mg by mouth every evening., Disp: , Rfl:    topiramate (TOPAMAX) 25 MG tablet, Take 1 tablet (25 mg total) by mouth 2 (two) times daily., Disp: 180 tablet, Rfl: 3   TRUE METRIX BLOOD GLUCOSE TEST test strip, daily., Disp: , Rfl:    valACYclovir (VALTREX) 500 MG tablet, Take 500 mg by mouth daily. , Disp: , Rfl:  No current facility-administered medications for this visit.  Facility-Administered Medications Ordered in Other Visits:    levalbuterol (XOPENEX) nebulizer solution 0.63 mg, 0.63 mg, Nebulization, Once, Parrett, Tammy S, NP      Objective:   There were no vitals filed for this visit.  Estimated body mass index is 27.76 kg/m as calculated from the following:   Height as of an earlier encounter on 06/01/23: 5\' 5"  (1.651 m).   Weight as of an earlier encounter on 06/01/23: 166 lb 12.8 oz (75.7 kg).  @WEIGHTCHANGE @  There were no vitals filed for this visit.   Physical Exam   General: No distress. Looks well. Sittting I car O2 at rest: no Cane present: no Sitting in wheel chair: no Frail: no Obese: no Neuro: Alert and Oriented x 3. GCS 15. Speech normal Psych: Pleasant        Assessment:       ICD-10-CM   1. Moderate persistent asthma without complication  J45.40 CT Chest Wo Contrast    2. Multiple lung nodules  R91.8 CT Chest Wo Contrast    3. Family history of cancer  Z80.9 CT Chest Wo Contrast         Plan:     Patient Instructions  Moderate persistent asthma without complication Mild acute bronchitis 02/06/2023  - stable   Plan - - Continue Symbicort any other medications as before - albuterol as needed -   Multiple lung nodules - last CT 2022-01-03Family history of cancer  - brother with recent diagnosis of lung cancer with mets to liver in 06/21/22 and passed away 2022-11-20- dad died of liver  cancer - CT chest August 2023 without cancer - appreciat your concern of high risk for lung cancer  Plan  - get CT ches wo contrast feb 21-Jun-2024; 18  month time pont  Follow-up - 3  months or sooner if needed   FOLLOWUP Return in about 3 months (around 08/30/2023) for 15 min visit, Lung Nodule, Asthma, with Dr Marchelle Gearing.    SIGNATURE    Dr. Kalman Shan, M.D., F.C.C.P,  Pulmonary and Critical Care Medicine Staff Physician, [facility] Director - Interstitial Lung Disease  Program  Pulmonary Fibrosis [facility] at [facility] [place], [place], [address]  Pager: [phone], If no answer or between  15:00h - 7:00h: call [phone] Telephone: [phone]  4:21 PM 06/01/2023

## 2023-05-31 NOTE — Progress Notes (Signed)
Subjective:    Patient ID: [name] [name], female    DOB: Jul 04, 1950, 72 y.o.   MRN: [id number]  HPI: [name] [name] is a 72 y.o. female who returns for follow up appointment for chronic pain and medication refill. She states her pain is located in her right shoulder and right arm, reports she had a MRI and Ortho following. She also reports neck pain,, mid- lower back pain radiating into her bilateral lower extremities. She rates her pain 8. Her current exercise regime is walking and performing  some stretching exercises.  Ms. Melear Morphine equivalent is 112.50 MME.   Last UDS was Performed on 02/05/2023, it was consistent.      Pain Inventory Average Pain 7 Pain Right Now 8 My pain is sharp, burning, dull, tingling, and aching  In the last 24 hours, has pain interfered with the following? General activity 7 Relation with others 7 Enjoyment of life 7 What TIME of day is your pain at its worst? morning , daytime, evening, and night Sleep (in general) Fair  Pain is worse with: walking, bending, sitting, inactivity, standing, and some activites Pain improves with: rest, heat/ice, pacing activities, and medication Relief from Meds: 7  Family History  Problem Relation Age of Onset   Hypertension Mother    Heart disease Mother        Mitral valve replacement.    Diabetes Mother    Liver cancer Father 57       HCC   Diabetes Brother    Lung cancer Brother 90       mets   Melanoma Brother 40       neck and back   Cancer Paternal Uncle        unknown type; dx after 63   Breast cancer Paternal Grandmother        dx 36s   Ovarian cancer Cousin        paternal cousin; d. 30s   Cancer Cousin        GYN; d. 73s   Colon cancer Neg Hx    Colon polyps Neg Hx    Esophageal cancer Neg Hx    Rectal cancer Neg Hx    Stomach cancer Neg Hx    Social History   Socioeconomic History   Marital status: Legally Separated    Spouse name: Not on file   Number of children: Not on file    Years of education: Not on file   Highest education level: Not on file  Occupational History   Occupation: disable    Employer: DISABLED  Tobacco Use   Smoking status: Never   Smokeless tobacco: Never  Vaping Use   Vaping status: Never Used  Substance and Sexual Activity   Alcohol use: No   Drug use: No   Sexual activity: Not on file    Comment: second hand smoke  Other Topics Concern   Not on file  Social History Narrative   Has one child lives at home with her.    Social Drivers of Corporate investment banker Strain: Not on file  Food Insecurity: Not on file  Transportation Needs: Not on file  Physical Activity: Not on file  Stress: Not on file  Social Connections: Not on file   Past Surgical History:  Procedure Laterality Date   ABDOMINAL HYSTERECTOMY  1987   W/ BSO   ANTERIOR CERVICAL DECOMP/DISCECTOMY FUSION N/A 08/09/2012   Procedure: ANTERIOR CERVICAL DECOMPRESSION/DISCECTOMY FUSION 1 LEVEL;  Surgeon:  Karn Cassis, MD;  Location: [facility];  Service: Neurosurgery;  Laterality: N/A;  Cervical four-five  Anterior cervical decompression/diskectomy/fusion   ANTERIOR FUSION CERVICAL SPINE  2007   C5 -6   APPENDECTOMY     APPLICATION OF INTRAOPERATIVE CT SCAN N/A 05/17/2017   Procedure: APPLICATION OF INTRAOPERATIVE CT SCAN;  Surgeon: Maeola Harman, MD;  Location: [facility];  Service: Neurosurgery;  Laterality: N/A;   BACK SURGERY     x 5   BREAST EXCISIONAL BIOPSY Bilateral    No scar seen    BREAST SURGERY     x 2 biopsies   CARPAL TUNNEL RELEASE  RIGHT - 2001  & DEC 2011 W/ BACK SURG.   CERVICAL DISC SURGERY  2005   C5 - 6   CHOLECYSTECTOMY  1994   COLONOSCOPY WITH PROPOFOL N/A 05/16/2022   Procedure: COLONOSCOPY WITH PROPOFOL;  Surgeon: Dolores Frame, MD;  [facility];  Service: Gastroenterology;  Laterality: N/A;  945 ASA 3   DORSAL COMPARTMENT RELEASE Right 04/07/2013   Procedure: RIGHT WRIST STS RELEASE;  Surgeon: Marlowe Shores, MD;  Location: [facility];  Service: Orthopedics;  Laterality: Right;   ENDOVENOUS ABLATION SAPHENOUS VEIN W/ LASER Right 05/29/2019   endovenous laser ablation right greater saphenous vein by Cari Caraway MD    EXPLORATION OF INCISION FOR CSF LEAK  DEC 2011  X2   POST LAMINECOTMY   FINGER ARTHRODESIS Right 04/07/2013   Procedure: RIGHT INDEX AND RIGHT LONG DISTAL INTERPHALANGEAL JOINT FUSIONS;  Surgeon: Marlowe Shores, MD;  Location: [facility];  Service: Orthopedics;  Laterality: Right;   FINGER ARTHROPLASTY Right 04/07/2013   Procedure: RIGHT THUMB CMC ARTHROPLASTY;  Surgeon: Marlowe Shores, MD;  Location: [facility];  Service: Orthopedics;  Laterality: Right;   GANGLION CYST EXCISION Right 04/07/2013   Procedure: RIGHT WRIST MASS EXCISION;  Surgeon: Marlowe Shores, MD;  Location: [facility];  Service: Orthopedics;  Laterality: Right;   KNEE ARTHROSCOPY  LEFT X3 (LAST ONE 2005)   KNEE ARTHROSCOPY  05/17/2011   Procedure: ARTHROSCOPY KNEE;  Surgeon: Curlene Labrum III;  Location: [facility];  Service: Orthopedics;  Laterality: Right;  WITH MEDIAL MENISECTOMY AND removal of suprapatella fat lump   LEFT WRIST TENOSYNECTOMY W/ LEFT THUMB JOINT REPAIR  02-24-10   LUMBAR FUSION  11-30-10   L4 - 5   LUMBAR FUSION  2000   L2 - 4   LUMBAR LAMINECTOMY  DEC 2011   L4 - 5   POSTERIOR CERVICAL FUSION/FORAMINOTOMY N/A 05/17/2017   Procedure: Cervical five  to Thorastic one  Posterior cervical fusion with lateral mass screws/revision of prior instrumentation;  Surgeon: Maeola Harman, MD;  Location: [facility];  Service: Neurosurgery;  Laterality: N/A;  C5 to T1 Posterior cervical fusion with lateral mass screws/revision of prior instrumentation   TENDON REPAIR  JAN 2010   LEFT INDEX AND LONG FINGERS   THUMB SURGERY   04/07/2019   RIGHT THUMB   TUBAL LIGATION     Past Surgical History:  Procedure Laterality  Date   ABDOMINAL HYSTERECTOMY  1987   W/ BSO   ANTERIOR CERVICAL DECOMP/DISCECTOMY FUSION N/A 08/09/2012   Procedure: ANTERIOR CERVICAL DECOMPRESSION/DISCECTOMY FUSION 1 LEVEL;  Surgeon: Karn Cassis, MD;  Location: [facility];  Service: Neurosurgery;  Laterality: N/A;  Cervical four-five  Anterior cervical decompression/diskectomy/fusion   ANTERIOR FUSION CERVICAL SPINE  2007   C5 -6  APPENDECTOMY     APPLICATION OF INTRAOPERATIVE CT SCAN N/A 05/17/2017   Procedure: APPLICATION OF INTRAOPERATIVE CT SCAN;  Surgeon: Maeola Harman, MD;  Location: [facility];  Service: Neurosurgery;  Laterality: N/A;   BACK SURGERY     x 5   BREAST EXCISIONAL BIOPSY Bilateral    No scar seen    BREAST SURGERY     x 2 biopsies   CARPAL TUNNEL RELEASE  RIGHT - 2001  & DEC 2011 W/ BACK SURG.   CERVICAL DISC SURGERY  2005   C5 - 6   CHOLECYSTECTOMY  1994   COLONOSCOPY WITH PROPOFOL N/A 05/16/2022   Procedure: COLONOSCOPY WITH PROPOFOL;  Surgeon: Dolores Frame, MD;  [facility];  Service: Gastroenterology;  Laterality: N/A;  945 ASA 3   DORSAL COMPARTMENT RELEASE Right 04/07/2013   Procedure: RIGHT WRIST STS RELEASE;  Surgeon: Marlowe Shores, MD;  Location: [facility];  Service: Orthopedics;  Laterality: Right;   ENDOVENOUS ABLATION SAPHENOUS VEIN W/ LASER Right 05/29/2019   endovenous laser ablation right greater saphenous vein by Cari Caraway MD    EXPLORATION OF INCISION FOR CSF LEAK  DEC 2011  X2   POST LAMINECOTMY   FINGER ARTHRODESIS Right 04/07/2013   Procedure: RIGHT INDEX AND RIGHT LONG DISTAL INTERPHALANGEAL JOINT FUSIONS;  Surgeon: Marlowe Shores, MD;  Location: [facility];  Service: Orthopedics;  Laterality: Right;   FINGER ARTHROPLASTY Right 04/07/2013   Procedure: RIGHT THUMB CMC ARTHROPLASTY;  Surgeon: Marlowe Shores, MD;  Location: [facility];  Service: Orthopedics;  Laterality: Right;   GANGLION CYST EXCISION  Right 04/07/2013   Procedure: RIGHT WRIST MASS EXCISION;  Surgeon: Marlowe Shores, MD;  Location: [facility];  Service: Orthopedics;  Laterality: Right;   KNEE ARTHROSCOPY  LEFT X3 (LAST ONE 2005)   KNEE ARTHROSCOPY  05/17/2011   Procedure: ARTHROSCOPY KNEE;  Surgeon: Curlene Labrum III;  Location: [facility];  Service: Orthopedics;  Laterality: Right;  WITH MEDIAL MENISECTOMY AND removal of suprapatella fat lump   LEFT WRIST TENOSYNECTOMY W/ LEFT THUMB JOINT REPAIR  02-24-10   LUMBAR FUSION  11-30-10   L4 - 5   LUMBAR FUSION  2000   L2 - 4   LUMBAR LAMINECTOMY  DEC 2011   L4 - 5   POSTERIOR CERVICAL FUSION/FORAMINOTOMY N/A 05/17/2017   Procedure: Cervical five  to Thorastic one  Posterior cervical fusion with lateral mass screws/revision of prior instrumentation;  Surgeon: Maeola Harman, MD;  Location: [facility];  Service: Neurosurgery;  Laterality: N/A;  C5 to T1 Posterior cervical fusion with lateral mass screws/revision of prior instrumentation   TENDON REPAIR  JAN 2010   LEFT INDEX AND LONG FINGERS   THUMB SURGERY   04/07/2019   RIGHT THUMB   TUBAL LIGATION     Past Medical History:  Diagnosis Date   Acute sinusitis, unspecified    Allergy    Anemia    "quite a few times"   Anxiety state, unspecified    Arachnoiditis BILATERAL LEGS   DUE TO MULTIPLE BACK SURG.'S   Arthritis    Asthma    last flare up was 03/2017 lasted over a month   Blood transfusion    Cancer (HCC)    skin cancers (in scalp)   Cardiomyopathy HX --06/2010   EF was 25% during acute illness (PHELONEPHRITIS) Repeat echo 12-06-10 60% showed normal EF.    Chronic back pain greater  than 3 months duration    S/P BACK SURG'S   CSF leak    Diabetes mellitus without complication (HCC)    dx 2013 type 2   Dyslipidemia    Dysphagia    some post op cerv fusion 2/14   Dysrhythmia    Essential hypertension, benign    Family history of adverse reaction to anesthesia    mother gets  n/v   Family history of breast cancer 02/06/2022   Family history of liver cancer 02/06/2022   Family history of lung cancer 02/06/2022   Family history of ovarian cancer 02/06/2022   GERD (gastroesophageal reflux disease) AND HIATIAL HERNIA   CONTROLLED W/ NEXIUM   Headache(784.0)    History of chronic bronchitis    Hx of bladder infections    Hyperlipidemia    TAKE CHLOSTEROL MEDICATION,04/23/19   Hypertension    Neuromuscular disorder (HCC)    numbness and tingling   Osteoporosis    Other malaise and fatigue    PONV (postoperative nausea and vomiting)    Shortness of breath    Spinal headache    Spinal stenosis, cervical region    Varicosities    venous   Weakness of both legs DUE TO ARACHNOIDITIS   OCCASIONAL USES CANE   There were no vitals taken for this visit.  Opioid Risk Score:   Fall Risk Score:  `1  Depression screen PHQ 2/9     05/04/2023    1:27 PM 04/06/2023    1:30 PM 03/06/2023    1:23 PM 12/04/2022    1:27 PM 11/03/2022    1:19 PM 10/03/2022    1:11 PM 09/05/2022    1:38 PM  Depression screen PHQ 2/9  Decreased Interest 1 0 0 1 0 0 0  Down, Depressed, Hopeless 1 0 0 1 0 0 0  PHQ - 2 Score 2 0 0 2 0 0 0    Review of Systems  Constitutional: Negative.   HENT: Negative.    Eyes: Negative.   Respiratory: Negative.    Cardiovascular: Negative.   Gastrointestinal: Negative.   Endocrine: Negative.   Genitourinary: Negative.   Musculoskeletal:  Positive for arthralgias and back pain.  Skin: Negative.   Allergic/Immunologic: Negative.   Neurological: Negative.   Hematological: Negative.   Psychiatric/Behavioral: Negative.        Objective:   Physical Exam Vitals and nursing note reviewed.  Constitutional:      Appearance: Normal appearance.  Cardiovascular:     Rate and Rhythm: Normal rate and regular rhythm.     Pulses: Normal pulses.     Heart sounds: Normal heart sounds.  Neurological:     Mental Status: She is alert.            Assessment & Plan:  1. On 05/17/2017 :C5C6C7 T1 Posterior Cervical Fusion with lateral mass fixation with AIRO Imaging, revision of prior instrumentation. By Dr. Venetia Maxon.  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 Continue current medication regimen with  Gabapentin 800 mg TID. Refilled: Oxycodone 15mg  one tablet 5 times a day  as needed for pain # 150 tablets We will continue the opioid monitoring program, this consists of regular clinic visits, examinations, urine drug screen, pill counts as well as use of [place] Controlled Substance Reporting system. 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 Continue to Monitor.  05/04/2023 3. Anxiety/depression: PCP Following. Continue to monitor. 12/132024. 4. Muscle Spasms: Continue current medication regime with Flexeril. Continue to Monitor. 06/01/2023 5. Cervicalgia/ Cervical Radiculitis: Dr Venetia Maxon Following. ,Continue current medication regime with Gabapentin:  S/P  C5C6C7 T1 Posterior Cervical Fusion with lateral mass fixation with AIRO Imaging, revision of prior instrumentation by Dr. Venetia Maxon on 05/17/2017. 06/01/2023 7. Bilateral Thoracic Back Pain: Continue HEP as Tolerated and Continue current medication regimen. Continue to Monitor. 06/01/2023  8. Left lower extremity DVT/ Phlebitis:  S/P endovenous laser ablation of left great saphenous vein on 05/09/2018 by Dr. Edilia Bo:  Vascular  Following. 06/01/2023. 9. Insomnia: Continue Pamelor. Continue to Monitor. 06/01/2023. 10. Acute Right Shoulder Pain / Right Shoulder Tendonitis: [facility] Following. She reports she had her  MRI. Continue to monitor.  06/01/2023 F/U in 1 month

## 2023-06-01 ENCOUNTER — Encounter: Payer: Self-pay | Admitting: Registered Nurse

## 2023-06-01 ENCOUNTER — Telehealth: Payer: Medicare Other | Admitting: Internal Medicine

## 2023-06-01 ENCOUNTER — Encounter: Payer: Medicare Other | Attending: Physical Medicine & Rehabilitation | Admitting: Registered Nurse

## 2023-06-01 VITALS — BP 162/81 | HR 74 | Ht 65.0 in | Wt 166.8 lb

## 2023-06-01 DIAGNOSIS — J4540 Moderate persistent asthma, uncomplicated: Secondary | ICD-10-CM | POA: Diagnosis not present

## 2023-06-01 DIAGNOSIS — R918 Other nonspecific abnormal finding of lung field: Secondary | ICD-10-CM

## 2023-06-01 DIAGNOSIS — Z809 Family history of malignant neoplasm, unspecified: Secondary | ICD-10-CM | POA: Diagnosis not present

## 2023-06-01 DIAGNOSIS — Z5181 Encounter for therapeutic drug level monitoring: Secondary | ICD-10-CM | POA: Diagnosis not present

## 2023-06-01 DIAGNOSIS — M25511 Pain in right shoulder: Secondary | ICD-10-CM | POA: Diagnosis not present

## 2023-06-01 DIAGNOSIS — G894 Chronic pain syndrome: Secondary | ICD-10-CM

## 2023-06-01 DIAGNOSIS — Z79891 Long term (current) use of opiate analgesic: Secondary | ICD-10-CM

## 2023-06-01 DIAGNOSIS — G8929 Other chronic pain: Secondary | ICD-10-CM

## 2023-06-01 DIAGNOSIS — M542 Cervicalgia: Secondary | ICD-10-CM | POA: Diagnosis not present

## 2023-06-01 DIAGNOSIS — M546 Pain in thoracic spine: Secondary | ICD-10-CM | POA: Insufficient documentation

## 2023-06-01 DIAGNOSIS — M5416 Radiculopathy, lumbar region: Secondary | ICD-10-CM | POA: Diagnosis not present

## 2023-06-01 MED ORDER — OXYCODONE HCL 15 MG PO TABS: 15.0000 mg | ORAL_TABLET | Freq: Every day | ORAL | 0 refills | Status: DC | PRN

## 2023-06-01 MED ORDER — ALBUTEROL SULFATE (2.5 MG/3ML) 0.083% IN NEBU
2.5000 mg | INHALATION_SOLUTION | Freq: Four times a day (QID) | RESPIRATORY_TRACT | 12 refills | Status: DC | PRN
Start: 2023-06-01 — End: 2023-07-05

## 2023-06-01 NOTE — Patient Instructions (Addendum)
Moderate persistent asthma without complication Mild acute bronchitis 02/06/2023  - stable   Plan - - Continue Symbicort any other medications as before - albuterol as needed -   Multiple lung nodules - last CT 01/20/22Family history of cancer  - brother with recent diagnosis of lung cancer with mets to liver in 2022-07-08 and passed away 07-Dec-2022- dad died of liver cancer - CT chest August 2023 without cancer - appreciat your concern of high risk for lung cancer  Plan  - get CT ches wo contrast feb 2024/07/08; 18  month time pont  Follow-up - 3  months or sooner if needed

## 2023-06-04 ENCOUNTER — Encounter: Payer: Medicare Other | Admitting: Registered Nurse

## 2023-06-04 DIAGNOSIS — E119 Type 2 diabetes mellitus without complications: Secondary | ICD-10-CM | POA: Diagnosis not present

## 2023-06-04 DIAGNOSIS — Z7984 Long term (current) use of oral hypoglycemic drugs: Secondary | ICD-10-CM | POA: Diagnosis not present

## 2023-06-04 DIAGNOSIS — Z961 Presence of intraocular lens: Secondary | ICD-10-CM | POA: Diagnosis not present

## 2023-06-07 DIAGNOSIS — M75121 Complete rotator cuff tear or rupture of right shoulder, not specified as traumatic: Secondary | ICD-10-CM | POA: Diagnosis not present

## 2023-06-19 DIAGNOSIS — E1143 Type 2 diabetes mellitus with diabetic autonomic (poly)neuropathy: Secondary | ICD-10-CM | POA: Diagnosis not present

## 2023-06-19 DIAGNOSIS — E7849 Other hyperlipidemia: Secondary | ICD-10-CM | POA: Diagnosis not present

## 2023-06-19 DIAGNOSIS — N390 Urinary tract infection, site not specified: Secondary | ICD-10-CM | POA: Diagnosis not present

## 2023-06-19 DIAGNOSIS — M818 Other osteoporosis without current pathological fracture: Secondary | ICD-10-CM | POA: Diagnosis not present

## 2023-06-19 DIAGNOSIS — I10 Essential (primary) hypertension: Secondary | ICD-10-CM | POA: Diagnosis not present

## 2023-07-04 NOTE — Progress Notes (Signed)
Subjective:    Patient ID: [name] [name], female    DOB: 12-04-1950, 73 y.o.   MRN: [id number]  HPI: [name] [name] is a 73 y.o. female who returns for follow up appointment for chronic pain and medication refill. She states her pain is located in her neck, right shoulder , Ortho following her right shoulder pain and lower back pain radiating into her bilateral lower extremities. She rates her pain 8. Her current exercise regime is walking short distances.  [name] [name] Morphine equivalent is 112.50 MME.   UDS ordered today.      Pain Inventory Average Pain 7 Pain Right Now 8 My pain is constant, sharp, burning, stabbing, tingling, and aching  In the last 24 hours, has pain interfered with the following? General activity 8 Relation with others 7 Enjoyment of life 7 What TIME of day is your pain at its worst? morning , daytime, evening, and night Sleep (in general) Fair  Pain is worse with: walking, bending, sitting, inactivity, standing, and some activites Pain improves with: rest, heat/ice, pacing activities, and medication Relief from Meds: 7  Family History  Problem Relation Age of Onset   Hypertension Mother    Heart disease Mother        Mitral valve replacement.    Diabetes Mother    Liver cancer Father 62       HCC   Diabetes Brother    Lung cancer Brother 58       mets   Melanoma Brother 26       neck and back   Cancer Paternal Uncle        unknown type; dx after 9   Breast cancer Paternal Grandmother        dx 50s   Ovarian cancer Cousin        paternal cousin; d. 30s   Cancer Cousin        GYN; d. 78s   Colon cancer Neg Hx    Colon polyps Neg Hx    Esophageal cancer Neg Hx    Rectal cancer Neg Hx    Stomach cancer Neg Hx    Social History   Socioeconomic History   Marital status: Legally Separated    Spouse name: Not on file   Number of children: Not on file   Years of education: Not on file   Highest education level: Not on file  Occupational  History   Occupation: disable    Employer: DISABLED  Tobacco Use   Smoking status: Never   Smokeless tobacco: Never  Vaping Use   Vaping status: Never Used  Substance and Sexual Activity   Alcohol use: No   Drug use: No   Sexual activity: Not on file    Comment: second hand smoke  Other Topics Concern   Not on file  Social History Narrative   Has one child lives at home with her.    Social Drivers of Corporate investment banker Strain: Not on file  Food Insecurity: Not on file  Transportation Needs: Not on file  Physical Activity: Not on file  Stress: Not on file  Social Connections: Not on file   Past Surgical History:  Procedure Laterality Date   ABDOMINAL HYSTERECTOMY  1987   W/ BSO   ANTERIOR CERVICAL DECOMP/DISCECTOMY FUSION N/A 08/09/2012   Procedure: ANTERIOR CERVICAL DECOMPRESSION/DISCECTOMY FUSION 1 LEVEL;  Surgeon: Karn Cassis, MD;  Location: [facility];  Service: Neurosurgery;  Laterality: N/A;  Cervical  four-five  Anterior cervical decompression/diskectomy/fusion   ANTERIOR FUSION CERVICAL SPINE  2007   C5 -6   APPENDECTOMY     APPLICATION OF INTRAOPERATIVE CT SCAN N/A 05/17/2017   Procedure: APPLICATION OF INTRAOPERATIVE CT SCAN;  Surgeon: Maeola Harman, MD;  [facility];  Service: Neurosurgery;  Laterality: N/A;   BACK SURGERY     x 5   BREAST EXCISIONAL BIOPSY Bilateral    No scar seen    BREAST SURGERY     x 2 biopsies   CARPAL TUNNEL RELEASE  RIGHT - 2001  & DEC 2011 W/ BACK SURG.   CERVICAL DISC SURGERY  2005   C5 - 6   CHOLECYSTECTOMY  1994   COLONOSCOPY WITH PROPOFOL N/A 05/16/2022   Procedure: COLONOSCOPY WITH PROPOFOL;  Surgeon: Dolores Frame, MD;  [facility];  Service: Gastroenterology;  Laterality: N/A;  945 ASA 3   DORSAL COMPARTMENT RELEASE Right 04/07/2013   Procedure: RIGHT WRIST STS RELEASE;  Surgeon: Marlowe Shores, MD;  Location: [facility];  Service: Orthopedics;  Laterality:  Right;   ENDOVENOUS ABLATION SAPHENOUS VEIN W/ LASER Right 05/29/2019   endovenous laser ablation right greater saphenous vein by Cari Caraway MD    EXPLORATION OF INCISION FOR CSF LEAK  DEC 2011  X2   POST LAMINECOTMY   FINGER ARTHRODESIS Right 04/07/2013   Procedure: RIGHT INDEX AND RIGHT LONG DISTAL INTERPHALANGEAL JOINT FUSIONS;  Surgeon: Marlowe Shores, MD;  Location: [facility];  Service: Orthopedics;  Laterality: Right;   FINGER ARTHROPLASTY Right 04/07/2013   Procedure: RIGHT THUMB CMC ARTHROPLASTY;  Surgeon: Marlowe Shores, MD;  Location: [facility];  Service: Orthopedics;  Laterality: Right;   GANGLION CYST EXCISION Right 04/07/2013   Procedure: RIGHT WRIST MASS EXCISION;  Surgeon: Marlowe Shores, MD;  Location: [facility];  Service: Orthopedics;  Laterality: Right;   KNEE ARTHROSCOPY  LEFT X3 (LAST ONE 2005)   KNEE ARTHROSCOPY  05/17/2011   Procedure: ARTHROSCOPY KNEE;  Surgeon: Curlene Labrum III;  Location: [facility];  Service: Orthopedics;  Laterality: Right;  WITH MEDIAL MENISECTOMY AND removal of suprapatella fat lump   LEFT WRIST TENOSYNECTOMY W/ LEFT THUMB JOINT REPAIR  02-24-10   LUMBAR FUSION  11-30-10   L4 - 5   LUMBAR FUSION  2000   L2 - 4   LUMBAR LAMINECTOMY  DEC 2011   L4 - 5   POSTERIOR CERVICAL FUSION/FORAMINOTOMY N/A 05/17/2017   Procedure: Cervical five  to Thorastic one  Posterior cervical fusion with lateral mass screws/revision of prior instrumentation;  Surgeon: Maeola Harman, MD;  Location: [facility] OR;  Service: Neurosurgery;  Laterality: N/A;  C5 to T1 Posterior cervical fusion with lateral mass screws/revision of prior instrumentation   TENDON REPAIR  JAN 2010   LEFT INDEX AND LONG FINGERS   THUMB SURGERY   04/07/2019   RIGHT THUMB   TUBAL LIGATION     Past Surgical History:  Procedure Laterality Date   ABDOMINAL HYSTERECTOMY  1987   W/ BSO   ANTERIOR CERVICAL DECOMP/DISCECTOMY  FUSION N/A 08/09/2012   Procedure: ANTERIOR CERVICAL DECOMPRESSION/DISCECTOMY FUSION 1 LEVEL;  Surgeon: Karn Cassis, MD;  [facility];  Service: Neurosurgery;  Laterality: N/A;  Cervical four-five  Anterior cervical decompression/diskectomy/fusion   ANTERIOR FUSION CERVICAL SPINE  2007   C5 -6   APPENDECTOMY     APPLICATION OF INTRAOPERATIVE CT SCAN N/A 05/17/2017   Procedure:  APPLICATION OF INTRAOPERATIVE CT SCAN;  Surgeon: Maeola Harman, MD;  Location: [facility] OR;  Service: Neurosurgery;  Laterality: N/A;   BACK SURGERY     x 5   BREAST EXCISIONAL BIOPSY Bilateral    No scar seen    BREAST SURGERY     x 2 biopsies   CARPAL TUNNEL RELEASE  RIGHT - 2001  & DEC 2011 W/ BACK SURG.   CERVICAL DISC SURGERY  2005   C5 - 6   CHOLECYSTECTOMY  1994   COLONOSCOPY WITH PROPOFOL N/A 05/16/2022   Procedure: COLONOSCOPY WITH PROPOFOL;  Surgeon: Dolores Frame, MD;  [facility];  Service: Gastroenterology;  Laterality: N/A;  945 ASA 3   DORSAL COMPARTMENT RELEASE Right 04/07/2013   Procedure: RIGHT WRIST STS RELEASE;  Surgeon: Marlowe Shores, MD;  Location: [facility];  Service: Orthopedics;  Laterality: Right;   ENDOVENOUS ABLATION SAPHENOUS VEIN W/ LASER Right 05/29/2019   endovenous laser ablation right greater saphenous vein by Cari Caraway MD    EXPLORATION OF INCISION FOR CSF LEAK  DEC 2011  X2   POST LAMINECOTMY   FINGER ARTHRODESIS Right 04/07/2013   Procedure: RIGHT INDEX AND RIGHT LONG DISTAL INTERPHALANGEAL JOINT FUSIONS;  Surgeon: Marlowe Shores, MD;  Location: [facility];  Service: Orthopedics;  Laterality: Right;   FINGER ARTHROPLASTY Right 04/07/2013   Procedure: RIGHT THUMB CMC ARTHROPLASTY;  Surgeon: Marlowe Shores, MD;  Location: [facility];  Service: Orthopedics;  Laterality: Right;   GANGLION CYST EXCISION Right 04/07/2013   Procedure: RIGHT WRIST MASS EXCISION;  Surgeon: Marlowe Shores,  MD;  Location:  SURGERY CENTER;  Service: Orthopedics;  Laterality: Right;   KNEE ARTHROSCOPY  LEFT X3 (LAST ONE 2005)   KNEE ARTHROSCOPY  05/17/2011   Procedure: ARTHROSCOPY KNEE;  Surgeon: Curlene Labrum III;  Location: [facility];  Service: Orthopedics;  Laterality: Right;  WITH MEDIAL MENISECTOMY AND removal of suprapatella fat lump   LEFT WRIST TENOSYNECTOMY W/ LEFT THUMB JOINT REPAIR  02-24-10   LUMBAR FUSION  11-30-10   L4 - 5   LUMBAR FUSION  2000   L2 - 4   LUMBAR LAMINECTOMY  DEC 2011   L4 - 5   POSTERIOR CERVICAL FUSION/FORAMINOTOMY N/A 05/17/2017   Procedure: Cervical five  to Thorastic one  Posterior cervical fusion with lateral mass screws/revision of prior instrumentation;  Surgeon: Maeola Harman, MD;  Location: [facility] OR;  Service: Neurosurgery;  Laterality: N/A;  C5 to T1 Posterior cervical fusion with lateral mass screws/revision of prior instrumentation   TENDON REPAIR  JAN 2010   LEFT INDEX AND LONG FINGERS   THUMB SURGERY   04/07/2019   RIGHT THUMB   TUBAL LIGATION     Past Medical History:  Diagnosis Date   Acute sinusitis, unspecified    Allergy    Anemia    "quite a few times"   Anxiety state, unspecified    Arachnoiditis BILATERAL LEGS   DUE TO MULTIPLE BACK SURG.'S   Arthritis    Asthma    last flare up was 03/2017 lasted over a month   Blood transfusion    Cancer (HCC)    skin cancers (in scalp)   Cardiomyopathy HX --06/2010   EF was 25% during acute illness (PHELONEPHRITIS) Repeat echo 12-06-10 60% showed normal EF.    Chronic back pain greater than 3 months duration    S/P BACK SURG'S   CSF leak  Diabetes mellitus without complication (HCC)    dx 2013 type 2   Dyslipidemia    Dysphagia    some post op cerv fusion 2/14   Dysrhythmia    Essential hypertension, benign    Family history of adverse reaction to anesthesia    mother gets n/v   Family history of breast cancer 02/06/2022   Family history of liver cancer  02/06/2022   Family history of lung cancer 02/06/2022   Family history of ovarian cancer 02/06/2022   GERD (gastroesophageal reflux disease) AND HIATIAL HERNIA   CONTROLLED W/ NEXIUM   Headache(784.0)    History of chronic bronchitis    Hx of bladder infections    Hyperlipidemia    TAKE CHLOSTEROL MEDICATION,04/23/19   Hypertension    Neuromuscular disorder (HCC)    numbness and tingling   Osteoporosis    Other malaise and fatigue    PONV (postoperative nausea and vomiting)    Shortness of breath    Spinal headache    Spinal stenosis, cervical region    Varicosities    venous   Weakness of both legs DUE TO ARACHNOIDITIS   OCCASIONAL USES CANE   There were no vitals taken for this visit.  Opioid Risk Score:   Fall Risk Score:  `1  Depression screen PHQ 2/9     05/04/2023    1:27 PM 04/06/2023    1:30 PM 03/06/2023    1:23 PM 12/04/2022    1:27 PM 11/03/2022    1:19 PM 10/03/2022    1:11 PM 09/05/2022    1:38 PM  Depression screen PHQ 2/9  Decreased Interest 1 0 0 1 0 0 0  Down, Depressed, Hopeless 1 0 0 1 0 0 0  PHQ - 2 Score 2 0 0 2 0 0 0    Review of Systems  Musculoskeletal:  Positive for back pain and neck pain.       Right shoulder, both legs, right arm   All other systems reviewed and are negative.      Objective:   Physical Exam Vitals and nursing note reviewed.  Constitutional:      Appearance: Normal appearance.  Neck:     Comments: Cervical Paraspinal Tenderness: C-5-C-6  Cardiovascular:     Rate and Rhythm: Normal rate and regular rhythm.     Pulses: Normal pulses.     Heart sounds: Normal heart sounds.  Pulmonary:     Effort: Pulmonary effort is normal.     Breath sounds: Normal breath sounds.  Musculoskeletal:     Comments: Normal Muscle Bulk and Muscle Testing Reveals:  Upper Extremities: Right: Decreased ROM 90 Degrees and Muscle Strength 4/5 Right AC Joint Tenderness Left Upper Extremity: Full ROM and Muscle Strength 5/5  Lumbar Paraspinal  Tenderness: L-3-L-5 Lower Extremities: Full ROM and Muscle Strength 5/5 Arises from Table slowly Narrow Based  Gait     Skin:    General: Skin is warm and dry.  Neurological:     Mental Status: She is alert and oriented to person, place, and time.  Psychiatric:        Mood and Affect: Mood normal.        Behavior: Behavior normal.         Assessment & Plan:  1. On 05/17/2017 :C5C6C7 T1 Posterior Cervical Fusion with lateral mass fixation with AIRO Imaging, revision of prior instrumentation. By Dr. Venetia Maxon.  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 Continue current medication regimen with  Gabapentin 800 mg TID. Refilled: Oxycodone 15mg  one tablet 5 times a day  as needed for pain # 150 tablets We will continue the opioid monitoring program, this consists of regular clinic visits, examinations, urine drug screen, pill counts as well as use of [place] Controlled Substance Reporting system. 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 Continue to Monitor. 07/05/2023 3. Anxiety/depression: PCP Following. Continue to monitor. 01/162025. 4. Muscle Spasms: Continue current medication regime with Flexeril. Continue to Monitor. 07/05/2023 5. Cervicalgia/ Cervical Radiculitis: Dr Venetia Maxon Following. ,Continue current medication regime with Gabapentin:  S/P  C5C6C7 T1 Posterior Cervical Fusion with lateral mass fixation with AIRO Imaging, revision of prior instrumentation by Dr. Venetia Maxon on 05/17/2017. 07/05/2023 7. Bilateral Thoracic Back Pain: Continue HEP as Tolerated and Continue current medication regimen. Continue to Monitor. 07/05/2023  8. Left lower extremity DVT/ Phlebitis:  S/P endovenous laser ablation of left great saphenous vein on 05/09/2018 by Dr. Edilia Bo:  Vascular   Following. 07/05/2023. 9. Insomnia: Continue Pamelor. Continue to Monitor. 06/01/2023. 10. Acute Right Shoulder Pain / Right Shoulder Tendonitis: [facility] Following. She reports she had her  MRI. Continue to monitor.  07/05/2023.  F/U in 1 month

## 2023-07-05 ENCOUNTER — Encounter: Payer: Medicare Other | Attending: Physical Medicine & Rehabilitation | Admitting: Registered Nurse

## 2023-07-05 ENCOUNTER — Encounter: Payer: Self-pay | Admitting: Registered Nurse

## 2023-07-05 VITALS — BP 130/77 | HR 91 | Ht 65.0 in

## 2023-07-05 DIAGNOSIS — M546 Pain in thoracic spine: Secondary | ICD-10-CM | POA: Insufficient documentation

## 2023-07-05 DIAGNOSIS — M542 Cervicalgia: Secondary | ICD-10-CM | POA: Diagnosis not present

## 2023-07-05 DIAGNOSIS — G039 Meningitis, unspecified: Secondary | ICD-10-CM | POA: Insufficient documentation

## 2023-07-05 DIAGNOSIS — G894 Chronic pain syndrome: Secondary | ICD-10-CM | POA: Insufficient documentation

## 2023-07-05 DIAGNOSIS — G8929 Other chronic pain: Secondary | ICD-10-CM | POA: Insufficient documentation

## 2023-07-05 DIAGNOSIS — Z5181 Encounter for therapeutic drug level monitoring: Secondary | ICD-10-CM | POA: Insufficient documentation

## 2023-07-05 DIAGNOSIS — Z79891 Long term (current) use of opiate analgesic: Secondary | ICD-10-CM | POA: Insufficient documentation

## 2023-07-05 DIAGNOSIS — M25511 Pain in right shoulder: Secondary | ICD-10-CM | POA: Diagnosis not present

## 2023-07-05 DIAGNOSIS — M5416 Radiculopathy, lumbar region: Secondary | ICD-10-CM | POA: Insufficient documentation

## 2023-07-05 MED ORDER — OXYCODONE HCL 15 MG PO TABS: 15.0000 mg | ORAL_TABLET | Freq: Every day | ORAL | 0 refills | Status: DC | PRN

## 2023-07-06 DIAGNOSIS — E7849 Other hyperlipidemia: Secondary | ICD-10-CM | POA: Diagnosis not present

## 2023-07-06 DIAGNOSIS — E1143 Type 2 diabetes mellitus with diabetic autonomic (poly)neuropathy: Secondary | ICD-10-CM | POA: Diagnosis not present

## 2023-07-06 DIAGNOSIS — I10 Essential (primary) hypertension: Secondary | ICD-10-CM | POA: Diagnosis not present

## 2023-07-09 ENCOUNTER — Telehealth: Payer: Self-pay | Admitting: Internal Medicine

## 2023-07-09 LAB — TOXASSURE SELECT,+ANTIDEPR,UR

## 2023-07-09 NOTE — Telephone Encounter (Signed)
PT needs a CT sched please. Having shoulder surgery and may need to sched sooner than March and would like it done before the surgery. Her # is [phone].She prefers  Feb 14th  because she will be in [facility] then. Her Pain Management appt that day is at 1:00.Please contact PT today to advise.

## 2023-07-10 DIAGNOSIS — M75121 Complete rotator cuff tear or rupture of right shoulder, not specified as traumatic: Secondary | ICD-10-CM | POA: Diagnosis not present

## 2023-07-10 NOTE — Telephone Encounter (Signed)
Patient checking on scheduling CT scan. Patient phone number is [phone].

## 2023-07-10 NOTE — Telephone Encounter (Signed)
Left a detailed voicemail of the Ct scan appt.

## 2023-07-12 ENCOUNTER — Telehealth: Payer: Self-pay | Admitting: Internal Medicine

## 2023-07-12 NOTE — Telephone Encounter (Signed)
Fax received from Dr. Duwayne Heck with [facility] to perform a right shoulder scope RCR, biceps tenodesis on patient.  Patient needs surgery clearance. Surgery is pending. Patient was seen on 06/01/23. Office protocol is a risk assessment can be sent to surgeon if patient has been seen in 60 days or less.   Sending to Dr Marchelle Gearing for risk assessment or recommendations if patient needs to be seen in office prior to surgical procedure.

## 2023-07-13 NOTE — Telephone Encounter (Signed)
Low risk for pulmonary complication for shoulder surgery but in the week before surgery should not be sick       has a past medical history of Acute sinusitis, unspecified, Allergy, Anemia, Anxiety state, unspecified, Arachnoiditis (BILATERAL LEGS), Arthritis, Asthma, Blood transfusion, Cancer (HCC), Cardiomyopathy (HX --06/2010), Chronic back pain greater than 3 months duration, CSF leak, Diabetes mellitus without complication (HCC), Dyslipidemia, Dysphagia, Dysrhythmia, Essential hypertension, benign, Family history of adverse reaction to anesthesia, Family history of breast cancer (02/06/2022), Family history of liver cancer (02/06/2022), Family history of lung cancer (02/06/2022), Family history of ovarian cancer (02/06/2022), GERD (gastroesophageal reflux disease) (AND HIATIAL HERNIA), Headache(784.0), History of chronic bronchitis, bladder infections, Hyperlipidemia, Hypertension, Neuromuscular disorder (HCC), Osteoporosis, Other malaise and fatigue, PONV (postoperative nausea and vomiting), Shortness of breath, Spinal headache, Spinal stenosis, cervical region, Varicosities, and Weakness of both legs (DUE TO ARACHNOIDITIS).   reports that she has never smoked. She has never used smokeless tobacco.  Past Surgical History:  Procedure Laterality Date   ABDOMINAL HYSTERECTOMY  1987   W/ BSO   ANTERIOR CERVICAL DECOMP/DISCECTOMY FUSION N/A 08/09/2012   Procedure: ANTERIOR CERVICAL DECOMPRESSION/DISCECTOMY FUSION 1 LEVEL;  Surgeon: Karn Cassis, MD;  Location: [facility];  Service: Neurosurgery;  Laterality: N/A;  Cervical four-five  Anterior cervical decompression/diskectomy/fusion   ANTERIOR FUSION CERVICAL SPINE  2007   C5 -6   APPENDECTOMY     APPLICATION OF INTRAOPERATIVE CT SCAN N/A 05/17/2017   Procedure: APPLICATION OF INTRAOPERATIVE CT SCAN;  Surgeon: Maeola Harman, MD;  Location: [facility];  Service: Neurosurgery;  Laterality: N/A;   BACK SURGERY     x 5   BREAST EXCISIONAL BIOPSY  Bilateral    No scar seen    BREAST SURGERY     x 2 biopsies   CARPAL TUNNEL RELEASE  RIGHT - 2001  & DEC 2011 W/ BACK SURG.   CERVICAL DISC SURGERY  2005   C5 - 6   CHOLECYSTECTOMY  1994   COLONOSCOPY WITH PROPOFOL N/A 05/16/2022   Procedure: COLONOSCOPY WITH PROPOFOL;  Surgeon: Dolores Frame, MD;  [facility];  Service: Gastroenterology;  Laterality: N/A;  945 ASA 3   DORSAL COMPARTMENT RELEASE Right 04/07/2013   Procedure: RIGHT WRIST STS RELEASE;  Surgeon: Marlowe Shores, MD;  Location: [facility];  Service: Orthopedics;  Laterality: Right;   ENDOVENOUS ABLATION SAPHENOUS VEIN W/ LASER Right 05/29/2019   endovenous laser ablation right greater saphenous vein by Cari Caraway MD    EXPLORATION OF INCISION FOR CSF LEAK  DEC 2011  X2   POST LAMINECOTMY   FINGER ARTHRODESIS Right 04/07/2013   Procedure: RIGHT INDEX AND RIGHT LONG DISTAL INTERPHALANGEAL JOINT FUSIONS;  Surgeon: Marlowe Shores, MD;  Location: [facility];  Service: Orthopedics;  Laterality: Right;   FINGER ARTHROPLASTY Right 04/07/2013   Procedure: RIGHT THUMB CMC ARTHROPLASTY;  Surgeon: Marlowe Shores, MD;  Location: [facility];  Service: Orthopedics;  Laterality: Right;   GANGLION CYST EXCISION Right 04/07/2013   Procedure: RIGHT WRIST MASS EXCISION;  Surgeon: Marlowe Shores, MD;  Location: [facility];  Service: Orthopedics;  Laterality: Right;   KNEE ARTHROSCOPY  LEFT X3 (LAST ONE 2005)   KNEE ARTHROSCOPY  05/17/2011   Procedure: ARTHROSCOPY KNEE;  Surgeon: Curlene Labrum III;  Location: [facility];  Service: Orthopedics;  Laterality: Right;  WITH MEDIAL MENISECTOMY AND removal of suprapatella fat lump   LEFT WRIST TENOSYNECTOMY  W/ LEFT THUMB JOINT REPAIR  02-24-10   LUMBAR FUSION  11-30-10   L4 - 5   LUMBAR FUSION  2000   L2 - 4   LUMBAR LAMINECTOMY  DEC 2011   L4 - 5   POSTERIOR CERVICAL  FUSION/FORAMINOTOMY N/A 05/17/2017   Procedure: Cervical five  to Thorastic one  Posterior cervical fusion with lateral mass screws/revision of prior instrumentation;  Surgeon: Maeola Harman, MD;  Location: [facility];  Service: Neurosurgery;  Laterality: N/A;  C5 to T1 Posterior cervical fusion with lateral mass screws/revision of prior instrumentation   TENDON REPAIR  JAN 2010   LEFT INDEX AND LONG FINGERS   THUMB SURGERY   04/07/2019   RIGHT THUMB   TUBAL LIGATION      Allergies  Allergen Reactions   Latex Itching and Rash   Morphine And Codeine Hives and Itching   Acetaminophen Nausea And Vomiting    Other Reaction(s): GI Intolerance   Hydrocodone    Morphine Itching and Other (See Comments)    Headaches  Other Reaction(s): Not available, Not available, Not available, Not available, Not available   Oxycodone Hcl    Aspirin Nausea And Vomiting    Can take coated asa  Other Reaction(s): GI Intolerance   Clarithromycin Diarrhea and Nausea And Vomiting   Clarithromycin Nausea And Vomiting    Other Reaction(s): GI Intolerance   Codeine Itching and Nausea And Vomiting   Darvocet [Propoxyphene N-Acetaminophen] Itching and Nausea And Vomiting   Hydrocodone-Acetaminophen Itching and Nausea And Vomiting   Lortab [Hydrocodone-Acetaminophen] Itching and Nausea And Vomiting   Percocet [Oxycodone-Acetaminophen] Itching and Nausea And Vomiting   Tramadol Nausea And Vomiting    Immunization History  Administered Date(s) Administered   Fluad Quad(high Dose 65+) 04/01/2019   Influenza Split 04/19/2013, 03/18/2014   Influenza, High Dose Seasonal PF 04/25/2018   Influenza,inj,Quad PF,6+ Mos 05/04/2015, 02/27/2017   PFIZER(Purple Top)SARS-COV-2 Vaccination 03/06/2020, 03/25/2020   Pneumococcal Polysaccharide-23 04/19/2013   Zoster, Live 05/30/2013    Family History  Problem Relation Age of Onset   Hypertension Mother    Heart disease Mother        Mitral valve replacement.    Diabetes  Mother    Liver cancer Father 35       HCC   Diabetes Brother    Lung cancer Brother 32       mets   Melanoma Brother 3       neck and back   Cancer Paternal Uncle        unknown type; dx after 86   Breast cancer Paternal Grandmother        dx 68s   Ovarian cancer Cousin        paternal cousin; d. 30s   Cancer Cousin        GYN; d. 17s   Colon cancer Neg Hx    Colon polyps Neg Hx    Esophageal cancer Neg Hx    Rectal cancer Neg Hx    Stomach cancer Neg Hx      Current Outpatient Medications:    albuterol (PROVENTIL) (2.5 MG/3ML) 0.083% nebulizer solution, Take 2.5 mg by nebulization every 4 (four) hours as needed for wheezing or shortness of breath., Disp: , Rfl:    albuterol (VENTOLIN HFA) 108 (90 Base) MCG/ACT inhaler, INHALE 2 PUFFS BY MOUTH EVERY 6 HOURS AS NEEDED FOR WHEEZING OR SHORTNESS OF BREATH, Disp: 8.5 g, Rfl: 4   aspirin EC 81 MG tablet, Take 81 mg by  mouth at bedtime. , Disp: , Rfl:    Biotin 1000 MCG tablet, Take 1,000 mcg by mouth daily., Disp: , Rfl:    budesonide-formoterol (SYMBICORT) 160-4.5 MCG/ACT inhaler, Inhale 2 puffs into the lungs 2 (two) times daily., Disp: 1 Inhaler, Rfl: 5   Calcium Carbonate-Vit D-Min 1200-1000 MG-UNIT CHEW, Chew 1 tablet by mouth daily., Disp: , Rfl:    Cyanocobalamin (VITAMIN B-12) 2500 MCG SUBL, Place 2,500 mcg under the tongue daily., Disp: , Rfl:    diltiazem (CARDIZEM CD) 240 MG 24 hr capsule, Take 240 mg by mouth daily., Disp: , Rfl:    gabapentin (NEURONTIN) 800 MG tablet, Take 1 tablet (800 mg total) by mouth 3 (three) times daily., Disp: 270 tablet, Rfl: 1   losartan-hydrochlorothiazide (HYZAAR) 50-12.5 MG tablet, Take 1 tablet by mouth daily., Disp: , Rfl:    metFORMIN (GLUCOPHAGE) 500 MG tablet, Take 500 mg by mouth daily with breakfast., Disp: , Rfl:    montelukast (SINGULAIR) 10 MG tablet, Take 10 mg by mouth daily., Disp: , Rfl:    naloxone (NARCAN) nasal spray 4 mg/0.1 mL, Place 1 spray into the nose once as needed  (Just in case of Opiod Overdose)., Disp: , Rfl:    nortriptyline (PAMELOR) 25 MG capsule, Take 1 capsule (25 mg total) by mouth at bedtime. Three month supply, Disp: 90 capsule, Rfl: 1   omeprazole (PRILOSEC) 40 MG capsule, Take 40 mg by mouth daily., Disp: , Rfl:    oxyCODONE (ROXICODONE) 15 MG immediate release tablet, Take 1 tablet (15 mg total) by mouth 5 (five) times daily as needed for pain., Disp: 150 tablet, Rfl: 0   promethazine (PHENERGAN) 25 MG tablet, Take 25 mg by mouth as needed. , Disp: , Rfl:    simvastatin (ZOCOR) 10 MG tablet, Take 10 mg by mouth every evening., Disp: , Rfl:    topiramate (TOPAMAX) 25 MG tablet, Take 1 tablet (25 mg total) by mouth 2 (two) times daily., Disp: 180 tablet, Rfl: 3   TRUE METRIX BLOOD GLUCOSE TEST test strip, daily., Disp: , Rfl:    valACYclovir (VALTREX) 500 MG tablet, Take 500 mg by mouth daily. , Disp: , Rfl:  No current facility-administered medications for this visit.  Facility-Administered Medications Ordered in Other Visits:    levalbuterol (XOPENEX) nebulizer solution 0.63 mg, 0.63 mg, Nebulization, Once, Parrett, Virgel Bouquet, NP

## 2023-07-18 NOTE — Telephone Encounter (Signed)
Copy of this note faxed to [facility]

## 2023-07-28 ENCOUNTER — Ambulatory Visit (HOSPITAL_COMMUNITY): Payer: Medicare Other

## 2023-08-03 ENCOUNTER — Ambulatory Visit (HOSPITAL_COMMUNITY)
Admission: RE | Admit: 2023-08-03 | Discharge: 2023-08-03 | Disposition: A | Discharge status: Home / Self Care | Payer: Medicare Other | Source: Ambulatory Visit | Attending: Internal Medicine | Admitting: Internal Medicine

## 2023-08-03 ENCOUNTER — Encounter: Payer: Medicare Other | Attending: Physical Medicine & Rehabilitation | Admitting: Registered Nurse

## 2023-08-03 VITALS — BP 126/80 | HR 79 | Ht 65.0 in | Wt 166.0 lb

## 2023-08-03 DIAGNOSIS — G8929 Other chronic pain: Secondary | ICD-10-CM | POA: Insufficient documentation

## 2023-08-03 DIAGNOSIS — M25511 Pain in right shoulder: Secondary | ICD-10-CM | POA: Insufficient documentation

## 2023-08-03 DIAGNOSIS — M5416 Radiculopathy, lumbar region: Secondary | ICD-10-CM | POA: Insufficient documentation

## 2023-08-03 DIAGNOSIS — M542 Cervicalgia: Secondary | ICD-10-CM | POA: Insufficient documentation

## 2023-08-03 DIAGNOSIS — Z79891 Long term (current) use of opiate analgesic: Secondary | ICD-10-CM | POA: Insufficient documentation

## 2023-08-03 DIAGNOSIS — J4540 Moderate persistent asthma, uncomplicated: Secondary | ICD-10-CM | POA: Insufficient documentation

## 2023-08-03 DIAGNOSIS — G039 Meningitis, unspecified: Secondary | ICD-10-CM | POA: Insufficient documentation

## 2023-08-03 DIAGNOSIS — M546 Pain in thoracic spine: Secondary | ICD-10-CM | POA: Diagnosis not present

## 2023-08-03 DIAGNOSIS — R918 Other nonspecific abnormal finding of lung field: Secondary | ICD-10-CM | POA: Insufficient documentation

## 2023-08-03 DIAGNOSIS — Z5181 Encounter for therapeutic drug level monitoring: Secondary | ICD-10-CM | POA: Diagnosis not present

## 2023-08-03 DIAGNOSIS — G894 Chronic pain syndrome: Secondary | ICD-10-CM | POA: Insufficient documentation

## 2023-08-03 DIAGNOSIS — I700 Atherosclerosis of aorta: Secondary | ICD-10-CM | POA: Diagnosis not present

## 2023-08-03 DIAGNOSIS — Z809 Family history of malignant neoplasm, unspecified: Secondary | ICD-10-CM | POA: Diagnosis not present

## 2023-08-03 MED ORDER — OXYCODONE HCL 15 MG PO TABS: 15.0000 mg | ORAL_TABLET | Freq: Every day | ORAL | 0 refills | Status: DC | PRN

## 2023-08-03 NOTE — Progress Notes (Signed)
Subjective:    Patient ID: [name] [name], female    DOB: [birth year], 73 y.o.   MRN: [id number]  HPI: [name] [name] is a 73 y.o. female who returns for follow up appointment for chronic pain and medication refill. She states her pain is located in her neck, right shoulder, mid- lower back pain radiating into her bilateral lower extremities. She rates her pain 7. Her current exercise regime is walking and performing stretching exercises.  [name] [name] Morphine equivalent is 112.50 MME.   Last UDS was Performed on 07/05/2023, it was consistent.     Pain Inventory Average Pain 7 Pain Right Now 7 My pain is sharp, burning, dull, stabbing, tingling, and aching  In the last 24 hours, has pain interfered with the following? General activity 5 Relation with others 0 Enjoyment of life 0 What TIME of day is your pain at its worst? morning , daytime, evening, and night Sleep (in general) Fair  Pain is worse with: some activites Pain improves with: rest and medication Relief from Meds: 7  Family History  Problem Relation Age of Onset   Hypertension Mother    Heart disease Mother        Mitral valve replacement.    Diabetes Mother    Liver cancer Father 39       HCC   Diabetes Brother    Lung cancer Brother 76       mets   Melanoma Brother 8       neck and back   Cancer Paternal Uncle        unknown type; dx after 37   Breast cancer Paternal Grandmother        dx 45s   Ovarian cancer Cousin        paternal cousin; d. 30s   Cancer Cousin        GYN; d. 32s   Colon cancer Neg Hx    Colon polyps Neg Hx    Esophageal cancer Neg Hx    Rectal cancer Neg Hx    Stomach cancer Neg Hx    Social History   Socioeconomic History   Marital status: Legally Separated    Spouse name: Not on file   Number of children: Not on file   Years of education: Not on file   Highest education level: Not on file  Occupational History   Occupation: disable    Employer: DISABLED  Tobacco Use    Smoking status: Never   Smokeless tobacco: Never  Vaping Use   Vaping status: Never Used  Substance and Sexual Activity   Alcohol use: No   Drug use: No   Sexual activity: Not on file    Comment: second hand smoke  Other Topics Concern   Not on file  Social History Narrative   Has one child lives at home with her.    Social Drivers of Corporate investment banker Strain: Not on file  Food Insecurity: Not on file  Transportation Needs: Not on file  Physical Activity: Not on file  Stress: Not on file  Social Connections: Not on file   Past Surgical History:  Procedure Laterality Date   ABDOMINAL HYSTERECTOMY  1987   W/ BSO   ANTERIOR CERVICAL DECOMP/DISCECTOMY FUSION N/A 08/09/2012   Procedure: ANTERIOR CERVICAL DECOMPRESSION/DISCECTOMY FUSION 1 LEVEL;  Surgeon: Karn Cassis, MD;  [facility];  Service: Neurosurgery;  Laterality: N/A;  Cervical four-five  Anterior cervical decompression/diskectomy/fusion   ANTERIOR FUSION  CERVICAL SPINE  2007   C5 -6   APPENDECTOMY     APPLICATION OF INTRAOPERATIVE CT SCAN N/A 05/17/2017   Procedure: APPLICATION OF INTRAOPERATIVE CT SCAN;  Surgeon: Maeola Harman, MD;  Location: [facility] OR;  Service: Neurosurgery;  Laterality: N/A;   BACK SURGERY     x 5   BREAST EXCISIONAL BIOPSY Bilateral    No scar seen    BREAST SURGERY     x 2 biopsies   CARPAL TUNNEL RELEASE  RIGHT - 2001  & DEC 2011 W/ BACK SURG.   CERVICAL DISC SURGERY  2005   C5 - 6   CHOLECYSTECTOMY  1994   COLONOSCOPY WITH PROPOFOL N/A 05/16/2022   Procedure: COLONOSCOPY WITH PROPOFOL;  Surgeon: Dolores Frame, MD;  [facility];  Service: Gastroenterology;  Laterality: N/A;  945 ASA 3   DORSAL COMPARTMENT RELEASE Right 04/07/2013   Procedure: RIGHT WRIST STS RELEASE;  Surgeon: Marlowe Shores, MD;  Location: [facility];  Service: Orthopedics;  Laterality: Right;   ENDOVENOUS ABLATION SAPHENOUS VEIN W/ LASER Right 05/29/2019    endovenous laser ablation right greater saphenous vein by Cari Caraway MD    EXPLORATION OF INCISION FOR CSF LEAK  DEC 2011  X2   POST LAMINECOTMY   FINGER ARTHRODESIS Right 04/07/2013   Procedure: RIGHT INDEX AND RIGHT LONG DISTAL INTERPHALANGEAL JOINT FUSIONS;  Surgeon: Marlowe Shores, MD;  Location: [facility];  Service: Orthopedics;  Laterality: Right;   FINGER ARTHROPLASTY Right 04/07/2013   Procedure: RIGHT THUMB CMC ARTHROPLASTY;  Surgeon: Marlowe Shores, MD;  Location: [facility];  Service: Orthopedics;  Laterality: Right;   GANGLION CYST EXCISION Right 04/07/2013   Procedure: RIGHT WRIST MASS EXCISION;  Surgeon: Marlowe Shores, MD;  Location: [facility];  Service: Orthopedics;  Laterality: Right;   KNEE ARTHROSCOPY  LEFT X3 (LAST ONE 2005)   KNEE ARTHROSCOPY  05/17/2011   Procedure: ARTHROSCOPY KNEE;  Surgeon: Curlene Labrum III;  Location: [facility];  Service: Orthopedics;  Laterality: Right;  WITH MEDIAL MENISECTOMY AND removal of suprapatella fat lump   LEFT WRIST TENOSYNECTOMY W/ LEFT THUMB JOINT REPAIR  02-24-10   LUMBAR FUSION  11-30-10   L4 - 5   LUMBAR FUSION  2000   L2 - 4   LUMBAR LAMINECTOMY  DEC 2011   L4 - 5   POSTERIOR CERVICAL FUSION/FORAMINOTOMY N/A 05/17/2017   Procedure: Cervical five  to Thorastic one  Posterior cervical fusion with lateral mass screws/revision of prior instrumentation;  Surgeon: Maeola Harman, MD;  Location: [facility] OR;  Service: Neurosurgery;  Laterality: N/A;  C5 to T1 Posterior cervical fusion with lateral mass screws/revision of prior instrumentation   TENDON REPAIR  JAN 2010   LEFT INDEX AND LONG FINGERS   THUMB SURGERY   04/07/2019   RIGHT THUMB   TUBAL LIGATION     Past Surgical History:  Procedure Laterality Date   ABDOMINAL HYSTERECTOMY  1987   W/ BSO   ANTERIOR CERVICAL DECOMP/DISCECTOMY FUSION N/A 08/09/2012   Procedure: ANTERIOR CERVICAL  DECOMPRESSION/DISCECTOMY FUSION 1 LEVEL;  Surgeon: Karn Cassis, MD;  [facility];  Service: Neurosurgery;  Laterality: N/A;  Cervical four-five  Anterior cervical decompression/diskectomy/fusion   ANTERIOR FUSION CERVICAL SPINE  2007   C5 -6   APPENDECTOMY     APPLICATION OF INTRAOPERATIVE CT SCAN N/A 05/17/2017   Procedure: APPLICATION OF INTRAOPERATIVE CT SCAN;  Surgeon: Maeola Harman,  MD;  [facility];  Service: Neurosurgery;  Laterality: N/A;   BACK SURGERY     x 5   BREAST EXCISIONAL BIOPSY Bilateral    No scar seen    BREAST SURGERY     x 2 biopsies   CARPAL TUNNEL RELEASE  RIGHT - 2001  & DEC 2011 W/ BACK SURG.   CERVICAL DISC SURGERY  2005   C5 - 6   CHOLECYSTECTOMY  1994   COLONOSCOPY WITH PROPOFOL N/A 05/16/2022   Procedure: COLONOSCOPY WITH PROPOFOL;  Surgeon: Dolores Frame, MD;  [facility];  Service: Gastroenterology;  Laterality: N/A;  945 ASA 3   DORSAL COMPARTMENT RELEASE Right 04/07/2013   Procedure: RIGHT WRIST STS RELEASE;  Surgeon: Marlowe Shores, MD;  Location: [facility];  Service: Orthopedics;  Laterality: Right;   ENDOVENOUS ABLATION SAPHENOUS VEIN W/ LASER Right 05/29/2019   endovenous laser ablation right greater saphenous vein by Cari Caraway MD    EXPLORATION OF INCISION FOR CSF LEAK  DEC 2011  X2   POST LAMINECOTMY   FINGER ARTHRODESIS Right 04/07/2013   Procedure: RIGHT INDEX AND RIGHT LONG DISTAL INTERPHALANGEAL JOINT FUSIONS;  Surgeon: Marlowe Shores, MD;  Location: [facility];  Service: Orthopedics;  Laterality: Right;   FINGER ARTHROPLASTY Right 04/07/2013   Procedure: RIGHT THUMB CMC ARTHROPLASTY;  Surgeon: Marlowe Shores, MD;  Location: [facility];  Service: Orthopedics;  Laterality: Right;   GANGLION CYST EXCISION Right 04/07/2013   Procedure: RIGHT WRIST MASS EXCISION;  Surgeon: Marlowe Shores, MD;  Location:  SURGERY CENTER;  Service:  Orthopedics;  Laterality: Right;   KNEE ARTHROSCOPY  LEFT X3 (LAST ONE 2005)   KNEE ARTHROSCOPY  05/17/2011   Procedure: ARTHROSCOPY KNEE;  Surgeon: Curlene Labrum III;  Location: [facility];  Service: Orthopedics;  Laterality: Right;  WITH MEDIAL MENISECTOMY AND removal of suprapatella fat lump   LEFT WRIST TENOSYNECTOMY W/ LEFT THUMB JOINT REPAIR  02-24-10   LUMBAR FUSION  11-30-10   L4 - 5   LUMBAR FUSION  2000   L2 - 4   LUMBAR LAMINECTOMY  DEC 2011   L4 - 5   POSTERIOR CERVICAL FUSION/FORAMINOTOMY N/A 05/17/2017   Procedure: Cervical five  to Thorastic one  Posterior cervical fusion with lateral mass screws/revision of prior instrumentation;  Surgeon: Maeola Harman, MD;  Location: [facility] OR;  Service: Neurosurgery;  Laterality: N/A;  C5 to T1 Posterior cervical fusion with lateral mass screws/revision of prior instrumentation   TENDON REPAIR  JAN 2010   LEFT INDEX AND LONG FINGERS   THUMB SURGERY   04/07/2019   RIGHT THUMB   TUBAL LIGATION     Past Medical History:  Diagnosis Date   Acute sinusitis, unspecified    Allergy    Anemia    "quite a few times"   Anxiety state, unspecified    Arachnoiditis BILATERAL LEGS   DUE TO MULTIPLE BACK SURG.'S   Arthritis    Asthma    last flare up was 03/2017 lasted over a month   Blood transfusion    Cancer (HCC)    skin cancers (in scalp)   Cardiomyopathy HX --06/2010   EF was 25% during acute illness (PHELONEPHRITIS) Repeat echo 12-06-10 60% showed normal EF.    Chronic back pain greater than 3 months duration    S/P BACK SURG'S   CSF leak    Diabetes mellitus without complication (HCC)  dx 2013 type 2   Dyslipidemia    Dysphagia    some post op cerv fusion 2/14   Dysrhythmia    Essential hypertension, benign    Family history of adverse reaction to anesthesia    mother gets n/v   Family history of breast cancer 02/06/2022   Family history of liver cancer 02/06/2022   Family history of lung cancer 02/06/2022    Family history of ovarian cancer 02/06/2022   GERD (gastroesophageal reflux disease) AND HIATIAL HERNIA   CONTROLLED W/ NEXIUM   Headache(784.0)    History of chronic bronchitis    Hx of bladder infections    Hyperlipidemia    TAKE CHLOSTEROL MEDICATION,04/23/19   Hypertension    Neuromuscular disorder (HCC)    numbness and tingling   Osteoporosis    Other malaise and fatigue    PONV (postoperative nausea and vomiting)    Shortness of breath    Spinal headache    Spinal stenosis, cervical region    Varicosities    venous   Weakness of both legs DUE TO ARACHNOIDITIS   OCCASIONAL USES CANE   There were no vitals taken for this visit.  Opioid Risk Score:   Fall Risk Score:  `1  Depression screen PHQ 2/9     07/05/2023    2:51 PM 05/04/2023    1:27 PM 04/06/2023    1:30 PM 03/06/2023    1:23 PM 12/04/2022    1:27 PM 11/03/2022    1:19 PM 10/03/2022    1:11 PM  Depression screen PHQ 2/9  Decreased Interest 0 1 0 0 1 0 0  Down, Depressed, Hopeless 0 1 0 0 1 0 0  PHQ - 2 Score 0 2 0 0 2 0 0     Review of Systems  Musculoskeletal:  Positive for back pain, gait problem and neck pain.       Shoulder pain   All other systems reviewed and are negative.     Objective:   Physical Exam Vitals and nursing note reviewed.  Constitutional:      Appearance: Normal appearance.  Neck:     Comments: Cervical Paraspinal Tenderness: C-5-C-6 Cardiovascular:     Rate and Rhythm: Normal rate and regular rhythm.     Pulses: Normal pulses.     Heart sounds: Normal heart sounds.  Pulmonary:     Effort: Pulmonary effort is normal.     Breath sounds: Normal breath sounds.  Musculoskeletal:     Comments: Normal Muscle Bulk and Muscle Testing Reveals:  Upper Extremities:Right: Decreased  ROM 20 Degrees  and Muscle Strength 3/5 Right AC Joint Tenderness Left Upper Extremity: Full ROM and Muscle Strength 5/5  Thoracic Paraspinal Tenderness: T-7-T-10 Lumbar Paraspinal Tenderness:  L-4-L-5 Lower Extremities: Full ROM and Muscle Strength 5/5 Arises from Table with ease Narrow Based  Gait     Skin:    General: Skin is warm and dry.  Neurological:     Mental Status: She is alert and oriented to person, place, and time.  Psychiatric:        Mood and Affect: Mood normal.        Behavior: Behavior normal.         Assessment & Plan:  1. On 05/17/2017 :C5C6C7 T1 Posterior Cervical Fusion with lateral mass fixation with AIRO Imaging, revision of prior instrumentation. By Dr. Venetia Maxon.  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 Continue current medication regimen with  Gabapentin 800 mg TID.  Refilled: Oxycodone 15mg  one tablet 5 times a day  as needed for pain # 150 tablets We will continue the opioid monitoring program, this consists of regular clinic visits, examinations, urine drug screen, pill counts as well as use of [place] Controlled Substance Reporting system. 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 Continue to Monitor. 08/03/2023 3. Anxiety/depression: PCP Following. Continue to monitor. 08/03/2023. 4. Muscle Spasms: Continue current medication regime with Flexeril. Continue to Monitor. 08/03/2023 5. Cervicalgia/ Cervical Radiculitis: Dr Venetia Maxon Following. ,Continue current medication regime with Gabapentin:  S/P  C5C6C7 T1 Posterior Cervical Fusion with lateral mass fixation with AIRO Imaging, revision of prior instrumentation by Dr. Venetia Maxon on 05/17/2017. 08/03/2023 7. Bilateral Thoracic Back Pain: Continue HEP as Tolerated and Continue current medication regimen. Continue to Monitor. 08/03/2023  8. Left lower extremity DVT/ Phlebitis:  S/P endovenous laser ablation of left great saphenous vein on 05/09/2018 by Dr. Edilia Bo:  Vascular  Following.  08/03/2023. 9. Insomnia: Continue Pamelor. Continue to Monitor. 08/03/2023. 10. Acute Right Shoulder Pain / Right Shoulder Tendonitis: [facility] Following.Continue to monitor.  08/03/2023.   F/U in 1 month

## 2023-08-07 ENCOUNTER — Telehealth: Payer: Self-pay

## 2023-08-07 NOTE — Telephone Encounter (Signed)
[name] [name] called in stating that her pharmacy was only able to fill for 105 pills that should last 3 weeks, she will need the remaining 45 sent in, they said they should have it in a few days.

## 2023-08-08 ENCOUNTER — Encounter: Payer: Self-pay | Admitting: Registered Nurse

## 2023-08-08 MED ORDER — OXYCODONE HCL 15 MG PO TABS: 15.0000 mg | ORAL_TABLET | Freq: Every day | ORAL | 0 refills | Status: DC | PRN

## 2023-08-10 DIAGNOSIS — N182 Chronic kidney disease, stage 2 (mild): Secondary | ICD-10-CM | POA: Diagnosis not present

## 2023-08-10 DIAGNOSIS — I10 Essential (primary) hypertension: Secondary | ICD-10-CM | POA: Diagnosis not present

## 2023-08-10 DIAGNOSIS — M818 Other osteoporosis without current pathological fracture: Secondary | ICD-10-CM | POA: Diagnosis not present

## 2023-08-10 DIAGNOSIS — E1143 Type 2 diabetes mellitus with diabetic autonomic (poly)neuropathy: Secondary | ICD-10-CM | POA: Diagnosis not present

## 2023-08-10 DIAGNOSIS — E7849 Other hyperlipidemia: Secondary | ICD-10-CM | POA: Diagnosis not present

## 2023-08-15 DIAGNOSIS — M810 Age-related osteoporosis without current pathological fracture: Secondary | ICD-10-CM | POA: Insufficient documentation

## 2023-08-15 NOTE — Progress Notes (Deleted)
 Office Visit Note  Patient: [name] [name]             Date of Birth: 12/13/1950           MRN: [id number]             PCP: Toma Deiters, MD Referring: Toma Deiters, MD Visit Date: 08/29/2023 Occupation: @GUAROCC @  Subjective:  No chief complaint on file.   History of Present Illness: [name] [name] is a 73 y.o. female ***     Activities of Daily Living:  Patient reports morning stiffness for *** {minute/hour:19697}.   Patient {ACTIONS;DENIES/REPORTS:21021675::"Denies"} nocturnal pain.  Difficulty dressing/grooming: {ACTIONS;DENIES/REPORTS:21021675::"Denies"} Difficulty climbing stairs: {ACTIONS;DENIES/REPORTS:21021675::"Denies"} Difficulty getting out of chair: {ACTIONS;DENIES/REPORTS:21021675::"Denies"} Difficulty using hands for taps, buttons, cutlery, and/or writing: {ACTIONS;DENIES/REPORTS:21021675::"Denies"}  No Rheumatology ROS completed.   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   Aortic atherosclerosis (HCC) 04/25/2018   Cervical stenosis of spinal canal 05/17/2017   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   At risk from passive smoking 04/18/2016   Moderate persistent asthma with acute exacerbation 04/18/2016   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   Chronic cough 11/06/2012   Lung nodule 09/24/2012   Mild persistent asthma, uncomplicated 09/24/2012   Lumbar post-laminectomy syndrome 03/22/2012   Depression with anxiety 09/29/2011   Cervical post-laminectomy syndrome 08/28/2011   Arachnoiditis  08/28/2011   Cervical radicular pain 08/28/2011   Anxiety 08/28/2011   Headache    Dyspnea 12/23/2010   Hypertension     Past Medical History:  Diagnosis Date   Acute sinusitis, unspecified    Allergy    Anemia    "quite a few times"   Anxiety state, unspecified    Arachnoiditis BILATERAL LEGS   DUE TO MULTIPLE BACK SURG.'S   Arthritis    Asthma    last flare up was 03/2017 lasted over a month   Blood transfusion    Cancer (HCC)    skin cancers (in scalp)   Cardiomyopathy HX --06/2010   EF was 25% during acute illness (PHELONEPHRITIS) Repeat echo 12-06-10 60% showed normal EF.    Chronic back pain greater than 3 months duration    S/P BACK SURG'S   CSF leak    Diabetes mellitus without complication (HCC)    dx 2013 type 2   Dyslipidemia    Dysphagia    some post op cerv fusion 2/14   Dysrhythmia    Essential hypertension, benign    Family history of adverse reaction to anesthesia    mother gets n/v   Family history of breast cancer 02/06/2022   Family history of liver cancer 02/06/2022   Family history of lung cancer 02/06/2022   Family history of ovarian cancer 02/06/2022   GERD (gastroesophageal reflux disease) AND HIATIAL HERNIA   CONTROLLED W/ NEXIUM   Headache(784.0)    History of chronic bronchitis    Hx of bladder infections    Hyperlipidemia    TAKE CHLOSTEROL MEDICATION,04/23/19   Hypertension    Neuromuscular disorder (HCC)    numbness and tingling  Osteoporosis    Other malaise and fatigue    PONV (postoperative nausea and vomiting)    Shortness of breath    Spinal headache    Spinal stenosis, cervical region    Varicosities    venous   Weakness of both legs DUE TO ARACHNOIDITIS   OCCASIONAL USES CANE    Family History  Problem Relation Age of Onset   Hypertension Mother    Heart disease Mother        Mitral valve replacement.    Diabetes Mother    Liver cancer Father 34       HCC   Diabetes Brother    Lung cancer Brother 84       mets    Melanoma Brother 41       neck and back   Cancer Paternal Uncle        unknown type; dx after 12   Breast cancer Paternal Grandmother        dx 24s   Ovarian cancer Cousin        paternal cousin; d. 30s   Cancer Cousin        GYN; d. 104s   Colon cancer Neg Hx    Colon polyps Neg Hx    Esophageal cancer Neg Hx    Rectal cancer Neg Hx    Stomach cancer Neg Hx    Past Surgical History:  Procedure Laterality Date   ABDOMINAL HYSTERECTOMY  1987   W/ BSO   ANTERIOR CERVICAL DECOMP/DISCECTOMY FUSION N/A 08/09/2012   Procedure: ANTERIOR CERVICAL DECOMPRESSION/DISCECTOMY FUSION 1 LEVEL;  Surgeon: Karn Cassis, MD;  [facility];  Service: Neurosurgery;  Laterality: N/A;  Cervical four-five  Anterior cervical decompression/diskectomy/fusion   ANTERIOR FUSION CERVICAL SPINE  2007   C5 -6   APPENDECTOMY     APPLICATION OF INTRAOPERATIVE CT SCAN N/A 05/17/2017   Procedure: APPLICATION OF INTRAOPERATIVE CT SCAN;  Surgeon: Maeola Harman, MD;  Location: [facility] OR;  Service: Neurosurgery;  Laterality: N/A;   BACK SURGERY     x 5   BREAST EXCISIONAL BIOPSY Bilateral    No scar seen    BREAST SURGERY     x 2 biopsies   CARPAL TUNNEL RELEASE  RIGHT - 2001  & DEC 2011 W/ BACK SURG.   CERVICAL DISC SURGERY  2005   C5 - 6   CHOLECYSTECTOMY  1994   COLONOSCOPY WITH PROPOFOL N/A 05/16/2022   Procedure: COLONOSCOPY WITH PROPOFOL;  Surgeon: Dolores Frame, MD;  [facility];  Service: Gastroenterology;  Laterality: N/A;  945 ASA 3   DORSAL COMPARTMENT RELEASE Right 04/07/2013   Procedure: RIGHT WRIST STS RELEASE;  Surgeon: Marlowe Shores, MD;  Location: [facility];  Service: Orthopedics;  Laterality: Right;   ENDOVENOUS ABLATION SAPHENOUS VEIN W/ LASER Right 05/29/2019   endovenous laser ablation right greater saphenous vein by Cari Caraway MD    EXPLORATION OF INCISION FOR CSF LEAK  DEC 2011  X2   POST LAMINECOTMY   FINGER ARTHRODESIS Right  04/07/2013   Procedure: RIGHT INDEX AND RIGHT LONG DISTAL INTERPHALANGEAL JOINT FUSIONS;  Surgeon: Marlowe Shores, MD;  Location: [facility];  Service: Orthopedics;  Laterality: Right;   FINGER ARTHROPLASTY Right 04/07/2013   Procedure: RIGHT THUMB CMC ARTHROPLASTY;  Surgeon: Marlowe Shores, MD;  Location: [facility];  Service: Orthopedics;  Laterality: Right;   GANGLION CYST EXCISION Right 04/07/2013   Procedure: RIGHT WRIST MASS  EXCISION;  Surgeon: Marlowe Shores, MD;  Location: [facility];  Service: Orthopedics;  Laterality: Right;   KNEE ARTHROSCOPY  LEFT X3 (LAST ONE 2005)   KNEE ARTHROSCOPY  05/17/2011   Procedure: ARTHROSCOPY KNEE;  Surgeon: Curlene Labrum III;  Location: [facility];  Service: Orthopedics;  Laterality: Right;  WITH MEDIAL MENISECTOMY AND removal of suprapatella fat lump   LEFT WRIST TENOSYNECTOMY W/ LEFT THUMB JOINT REPAIR  02-24-10   LUMBAR FUSION  11-30-10   L4 - 5   LUMBAR FUSION  2000   L2 - 4   LUMBAR LAMINECTOMY  DEC 2011   L4 - 5   POSTERIOR CERVICAL FUSION/FORAMINOTOMY N/A 05/17/2017   Procedure: Cervical five  to Thorastic one  Posterior cervical fusion with lateral mass screws/revision of prior instrumentation;  Surgeon: Maeola Harman, MD;  Location: [facility];  Service: Neurosurgery;  Laterality: N/A;  C5 to T1 Posterior cervical fusion with lateral mass screws/revision of prior instrumentation   TENDON REPAIR  JAN 2010   LEFT INDEX AND LONG FINGERS   THUMB SURGERY   04/07/2019   RIGHT THUMB   TUBAL LIGATION     Social History   Social History Narrative   Has one child lives at home with her.    Immunization History  Administered Date(s) Administered   Fluad Quad(high Dose 65+) 04/01/2019   Influenza Split 04/19/2013, 03/18/2014   Influenza, High Dose Seasonal PF 04/25/2018   Influenza,inj,Quad PF,6+ Mos 05/04/2015, 02/27/2017   PFIZER(Purple Top)SARS-COV-2 Vaccination  03/06/2020, 03/25/2020   Pneumococcal Polysaccharide-23 04/19/2013   Zoster, Live 05/30/2013     Objective: Vital Signs: There were no vitals taken for this visit.   Physical Exam   Musculoskeletal Exam: ***  CDAI Exam: CDAI Score: -- Patient Global: --; Provider Global: -- Swollen: --; Tender: -- Joint Exam 08/29/2023   No joint exam has been documented for this visit   There is currently no information documented on the homunculus. Go to the Rheumatology activity and complete the homunculus joint exam.  Investigation: No additional findings.  Imaging: No results found.  Recent Labs: Lab Results  Component Value Date   WBC 5.8 05/10/2022   HGB 12.2 05/10/2022   PLT 249 05/10/2022   NA 139 05/10/2022   K 3.3 (L) 05/10/2022   CL 107 05/10/2022   CO2 26 05/10/2022   GLUCOSE 92 05/10/2022   BUN 9 05/10/2022   CREATININE 0.62 05/10/2022   BILITOT 0.7 01/21/2007   ALKPHOS 143 (H) 01/21/2007   AST 19 01/21/2007   ALT 18 01/21/2007   PROT 7.0 01/21/2007   ALBUMIN 3.9 01/21/2007   CALCIUM 8.8 (L) 05/10/2022   GFRAA >60 05/17/2017   December 14, 2022 LDL 83, hemoglobin A1c 6.1, BMP normal, anti-CCP 6, sed rate 17, RF 21.3, ANA negative Speciality Comments: No specialty comments available.  Procedures:  No procedures performed Allergies: Latex, Morphine and codeine, Acetaminophen, Hydrocodone, Morphine, Oxycodone hcl, Aspirin, Clarithromycin, Clarithromycin, Codeine, Darvocet [propoxyphene n-acetaminophen], Hydrocodone-acetaminophen, Lortab [hydrocodone-acetaminophen], Percocet [oxycodone-acetaminophen], and Tramadol   Assessment / Plan:     Visit Diagnoses: No diagnosis found.  Orders: No orders of the defined types were placed in this encounter.  No orders of the defined types were placed in this encounter.   Face-to-face time spent with patient was *** minutes. Greater than 50% of time was spent in counseling and coordination of care.  Follow-Up Instructions:  No follow-ups on file.   Pollyann Savoy, MD  Note - This record  has been created using [employer].  Chart creation errors have been sought, but may not always  have been located. Such creation errors do not reflect on  the standard of medical care.

## 2023-08-29 ENCOUNTER — Encounter: Payer: Self-pay | Admitting: Internal Medicine

## 2023-08-29 ENCOUNTER — Ambulatory Visit: Payer: Medicare Other | Admitting: Internal Medicine

## 2023-08-29 ENCOUNTER — Encounter: Payer: Medicare Other | Admitting: Rheumatology

## 2023-08-29 VITALS — BP 132/78 | HR 83 | Temp 98.3°F

## 2023-08-29 DIAGNOSIS — M25532 Pain in left wrist: Secondary | ICD-10-CM

## 2023-08-29 DIAGNOSIS — J4541 Moderate persistent asthma with (acute) exacerbation: Secondary | ICD-10-CM

## 2023-08-29 DIAGNOSIS — M2550 Pain in unspecified joint: Secondary | ICD-10-CM

## 2023-08-29 DIAGNOSIS — Z803 Family history of malignant neoplasm of breast: Secondary | ICD-10-CM

## 2023-08-29 DIAGNOSIS — Z809 Family history of malignant neoplasm, unspecified: Secondary | ICD-10-CM

## 2023-08-29 DIAGNOSIS — J4540 Moderate persistent asthma, uncomplicated: Secondary | ICD-10-CM

## 2023-08-29 DIAGNOSIS — G039 Meningitis, unspecified: Secondary | ICD-10-CM

## 2023-08-29 DIAGNOSIS — Z800 Family history of malignant neoplasm of digestive organs: Secondary | ICD-10-CM

## 2023-08-29 DIAGNOSIS — I517 Cardiomegaly: Secondary | ICD-10-CM | POA: Diagnosis not present

## 2023-08-29 DIAGNOSIS — Z801 Family history of malignant neoplasm of trachea, bronchus and lung: Secondary | ICD-10-CM

## 2023-08-29 DIAGNOSIS — J398 Other specified diseases of upper respiratory tract: Secondary | ICD-10-CM

## 2023-08-29 DIAGNOSIS — M810 Age-related osteoporosis without current pathological fracture: Secondary | ICD-10-CM

## 2023-08-29 DIAGNOSIS — I10 Essential (primary) hypertension: Secondary | ICD-10-CM

## 2023-08-29 DIAGNOSIS — M961 Postlaminectomy syndrome, not elsewhere classified: Secondary | ICD-10-CM

## 2023-08-29 DIAGNOSIS — R21 Rash and other nonspecific skin eruption: Secondary | ICD-10-CM | POA: Diagnosis not present

## 2023-08-29 DIAGNOSIS — I700 Atherosclerosis of aorta: Secondary | ICD-10-CM

## 2023-08-29 DIAGNOSIS — Z8041 Family history of malignant neoplasm of ovary: Secondary | ICD-10-CM

## 2023-08-29 DIAGNOSIS — R768 Other specified abnormal immunological findings in serum: Secondary | ICD-10-CM

## 2023-08-29 DIAGNOSIS — R911 Solitary pulmonary nodule: Secondary | ICD-10-CM

## 2023-08-29 DIAGNOSIS — M79641 Pain in right hand: Secondary | ICD-10-CM

## 2023-08-29 DIAGNOSIS — E785 Hyperlipidemia, unspecified: Secondary | ICD-10-CM

## 2023-08-29 NOTE — Patient Instructions (Addendum)
 Moderate persistent asthma without complication  - stable   Plan - - Continue Symbicort and Singulair scheduled - albuterol as needed -   Multiple lung nodules - last CT Dec 2021 Family history of cancer  - brother with recent diagnosis of lung cancer with mets to liver in Sep 03, 2021 and passed away 11/04/22- dad died of liver cancer - CT chest August 2023  and Feb 2025 without cancer - appreciat your concern of high risk for lung cancer  Plan  - expectant followup  Enlarged heart on CT Aortic atherosclerosis on CT  Plan  - get echo   Rash Right Upper Extermity  Plan - talk to your PCP Toma Deiters, MD   Follow-up - 6  months or sooner if needed

## 2023-08-29 NOTE — Progress Notes (Signed)
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 had lung cancer and she came to a shared understanding with him that she would be very aggressive for lung cancer screening.  Therefore she wants this CT scan of the chest.  She had agreed for 39-month time point which would be February 2025.  Agreed to order CT scan of the chest without contrast.  OV 08/29/2023  Subjective:  Patient ID: Shelia [name], female , DOB: 24-Dec-1950 , age 73 y.o. , MRN: [id number] , ADDRESS: [address] [place] [address] PCP Toma Deiters, MD Patient Care Team: Toma Deiters, MD as PCP - General (Unknown Physician Specialty)  This Provider for this visit: Treatment Team:  Attending Provider: Kalman Shan, MD    08/29/2023 -   Chief Complaint  Patient presents with   Follow-up    Overall doing well. She has occ hoarseness and dry cough.      HPI AZARA GEMME 73 y.o. -follow-up asthma with family  history of lung cancer with concern for lung cancer and a previous history of nodules  She presents for follow-up.  States her respiratory status is fine.  She is on Singulair and scheduled Symbicort no issues in her respiratory status.  She had a CT scan of the chest because of fear of lung cancer and previous history of nodules and there were no nodules reported but she is worried about atherosclerosis that was documented.  I showed her the calcium plaque in the aorta and is very small.  In addition there is no coronary artery calcification.  I reassured her about this.  She is worried about also cardiomegaly reported on the CT scan.  I did not appreciated that much on the CT scan but have agreed to get the echo because the last one was 4 years ago.  She is quite keen on getting an echo.    CT Chest data from date: 08/03/2023  Narrative & Impression  CLINICAL DATA:  Lung nodules.  EXAM: CT CHEST WITHOUT CONTRAST   TECHNIQUE: Multidetector CT imaging of the chest was performed following the standard protocol without IV contrast.   RADIATION DOSE REDUCTION: This exam was performed according to the departmental dose-optimization program which includes automated exposure control, adjustment of the mA and/or kV according to patient size and/or use of iterative reconstruction technique.   COMPARISON:  01/18/2022.   FINDINGS: Cardiovascular: Atherosclerotic calcification of the aorta. Heart is enlarged. No pericardial effusion.   Mediastinum/Nodes: No pathologically enlarged mediastinal or axillary lymph nodes. Hilar regions are difficult to definitively evaluate without IV contrast. Esophagus is grossly unremarkable.   Lungs/Pleura: Mild scattered pulmonary parenchymal scarring. No suspicious pulmonary nodules. Millimetric pulmonary nodules in the dependent lower lobes, some of which are calcified, unchanged from 01/18/2022 and benign. No pleural fluid. Airway is unremarkable.    Upper Abdomen: Liver may be mildly heterogeneous in attenuation. Cholecystectomy. Visualized portions of the liver, adrenal glands, left kidney, spleen, pancreas, stomach and bowel are otherwise grossly unremarkable. No upper abdominal adenopathy.   Musculoskeletal: Degenerative changes in the spine. Old T6 superior endplate compression fracture. Slight compression of the T10 superior endplate is new in the interval but age indeterminate.   IMPRESSION: 1. No suspicious pulmonary nodules requiring follow-up. 2. Liver may be mildly steatotic. 3.  Aortic atherosclerosis (ICD10-I70.0).     Electronically Signed   By: Leanna Battles M.D.   On: 08/21/2023 16:11    PFT      No data to display             LAB RESULTS last 96 hours No results found.       has a past medical history of Acute sinusitis, unspecified, Allergy, Anemia, Anxiety state, unspecified, Arachnoiditis (BILATERAL LEGS), Arthritis, Asthma, Blood transfusion, Cancer (HCC), Cardiomyopathy (HX --06/2010), Chronic back pain greater than 3 months duration, CSF leak, Diabetes mellitus without complication (HCC), Dyslipidemia, Dysphagia, Dysrhythmia, Essential hypertension, benign, Family history of adverse reaction to anesthesia, Family history of breast cancer (02/06/2022), Family history of liver cancer (02/06/2022), Family history of lung cancer (02/06/2022), Family history of ovarian cancer (02/06/2022), GERD (gastroesophageal reflux disease) (AND HIATIAL HERNIA), Headache(784.0), History of chronic bronchitis, bladder infections, Hyperlipidemia, Hypertension, Neuromuscular disorder (HCC), Osteoporosis, Other malaise and fatigue, PONV (postoperative nausea and vomiting), Shortness of breath, Spinal headache, Spinal stenosis, cervical region, Varicosities, and Weakness of both legs (DUE TO ARACHNOIDITIS).   reports that she has never smoked. She has never used smokeless tobacco.  Past Surgical History:  Procedure  Laterality Date   ABDOMINAL HYSTERECTOMY  1987   W/ BSO   ANTERIOR CERVICAL DECOMP/DISCECTOMY FUSION N/A 08/09/2012   Procedure: ANTERIOR CERVICAL DECOMPRESSION/DISCECTOMY FUSION 1 LEVEL;  Surgeon: Karn Cassis, MD;  Location: [facility];  Service: Neurosurgery;  Laterality: N/A;  Cervical four-five  Anterior cervical decompression/diskectomy/fusion   ANTERIOR FUSION CERVICAL SPINE  2007   C5 -6   APPENDECTOMY     APPLICATION OF INTRAOPERATIVE CT SCAN N/A 05/17/2017   Procedure: APPLICATION OF INTRAOPERATIVE CT SCAN;  Surgeon: Maeola Harman, MD;  Location[facility];  Service: Neurosurgery;  Laterality: N/A;   BACK SURGERY     x 5   BREAST EXCISIONAL BIOPSY Bilateral    No scar seen    BREAST SURGERY     x 2 biopsies   CARPAL TUNNEL RELEASE  RIGHT - 2001  & DEC 2011 W/ BACK SURG.   CERVICAL DISC SURGERY  2005   C5 - 6   CHOLECYSTECTOMY  1994   COLONOSCOPY  WITH PROPOFOL N/A 05/16/2022   Procedure: COLONOSCOPY WITH PROPOFOL;  Surgeon: Dolores Frame, MD;  Location: [facility];  Service: Gastroenterology;  Laterality: N/A;  945 ASA 3   DORSAL COMPARTMENT RELEASE Right 04/07/2013   Procedure: RIGHT WRIST STS RELEASE;  Surgeon: Marlowe Shores, MD;  Location: [facility];  Service: Orthopedics;  Laterality: Right;   ENDOVENOUS ABLATION SAPHENOUS VEIN W/ LASER Right 05/29/2019   endovenous laser ablation right greater saphenous vein by Cari Caraway MD    EXPLORATION OF INCISION FOR CSF LEAK  DEC 2011  X2   POST LAMINECOTMY   FINGER ARTHRODESIS Right 04/07/2013   Procedure: RIGHT INDEX AND RIGHT LONG DISTAL INTERPHALANGEAL JOINT FUSIONS;  Surgeon: Marlowe Shores, MD;  Location: [facility];  Service: Orthopedics;  Laterality: Right;   FINGER ARTHROPLASTY Right 04/07/2013   Procedure: RIGHT THUMB CMC ARTHROPLASTY;  Surgeon: Marlowe Shores, MD;  Location: [facility];  Service: Orthopedics;  Laterality: Right;   GANGLION CYST  EXCISION Right 04/07/2013   Procedure: RIGHT WRIST MASS EXCISION;  Surgeon: Marlowe Shores, MD;  Location: [facility];  Service: Orthopedics;  Laterality: Right;   KNEE ARTHROSCOPY  LEFT X3 (LAST ONE 2005)   KNEE ARTHROSCOPY  05/17/2011   Procedure: ARTHROSCOPY KNEE;  Surgeon: Curlene Labrum III;  Location: [facility];  Service: Orthopedics;  Laterality: Right;  WITH MEDIAL MENISECTOMY AND removal of suprapatella fat lump   LEFT WRIST TENOSYNECTOMY W/ LEFT THUMB JOINT REPAIR  02-24-10   LUMBAR FUSION  11-30-10   L4 - 5   LUMBAR FUSION  2000   L2 - 4   LUMBAR LAMINECTOMY  DEC 2011   L4 - 5   POSTERIOR CERVICAL FUSION/FORAMINOTOMY N/A 05/17/2017   Procedure: Cervical five  to Thorastic one  Posterior cervical fusion with lateral mass screws/revision of prior instrumentation;  Surgeon: Maeola Harman, MD;  Location[facility];  Service: Neurosurgery;  Laterality: N/A;  C5 to T1 Posterior cervical fusion with lateral mass screws/revision of prior instrumentation   TENDON REPAIR  JAN 2010   LEFT INDEX AND LONG FINGERS   THUMB SURGERY   04/07/2019   RIGHT THUMB   TUBAL LIGATION      Allergies  Allergen Reactions   Latex Itching and Rash   Morphine And Codeine Hives and Itching   Acetaminophen Nausea And Vomiting    Other Reaction(s): GI Intolerance   Hydrocodone    Morphine Itching and Other (See Comments)    Headaches  Other Reaction(s): Not available, Not available, Not available, Not available, Not available   Oxycodone Hcl    Aspirin Nausea And Vomiting    Can take coated asa  Other Reaction(s): GI Intolerance   Clarithromycin Diarrhea and Nausea And Vomiting   Clarithromycin Nausea And Vomiting    Other Reaction(s): GI Intolerance   Codeine Itching and Nausea And Vomiting   Darvocet [Propoxyphene N-Acetaminophen] Itching and Nausea And Vomiting   Hydrocodone-Acetaminophen Itching and Nausea And Vomiting   Lortab [Hydrocodone-Acetaminophen]  Itching and Nausea And Vomiting   Percocet [Oxycodone-Acetaminophen] Itching and Nausea And Vomiting   Tramadol Nausea And Vomiting    Immunization History  Administered Date(s) Administered   Fluad Quad(high Dose 65+) 04/01/2019   Influenza Split 04/19/2013, 03/18/2014   Influenza, High Dose Seasonal PF 04/25/2018   Influenza,inj,Quad PF,6+ Mos 05/04/2015, 02/27/2017   PFIZER(Purple Top)SARS-COV-2 Vaccination 03/06/2020, 03/25/2020   Pneumococcal Polysaccharide-23 04/19/2013   Zoster, Live 05/30/2013  Family History  Problem Relation Age of Onset   Hypertension Mother    Heart disease Mother        Mitral valve replacement.    Diabetes Mother    Liver cancer Father 14       HCC   Diabetes Brother    Lung cancer Brother 58       mets   Melanoma Brother 73       neck and back   Cancer Paternal Uncle        unknown type; dx after 27   Breast cancer Paternal Grandmother        dx 19s   Ovarian cancer Cousin        paternal cousin; d. 30s   Cancer Cousin        GYN; d. 29s   Colon cancer Neg Hx    Colon polyps Neg Hx    Esophageal cancer Neg Hx    Rectal cancer Neg Hx    Stomach cancer Neg Hx      Current Outpatient Medications:    albuterol (PROVENTIL) (2.5 MG/3ML) 0.083% nebulizer solution, Take 2.5 mg by nebulization every 4 (four) hours as needed for wheezing or shortness of breath., Disp: , Rfl:    albuterol (VENTOLIN HFA) 108 (90 Base) MCG/ACT inhaler, INHALE 2 PUFFS BY MOUTH EVERY 6 HOURS AS NEEDED FOR WHEEZING OR SHORTNESS OF BREATH, Disp: 8.5 g, Rfl: 4   aspirin EC 81 MG tablet, Take 81 mg by mouth at bedtime. , Disp: , Rfl:    Biotin 1000 MCG tablet, Take 1,000 mcg by mouth daily., Disp: , Rfl:    budesonide-formoterol (SYMBICORT) 160-4.5 MCG/ACT inhaler, Inhale 2 puffs into the lungs 2 (two) times daily., Disp: 1 Inhaler, Rfl: 5   Cyanocobalamin (VITAMIN B-12) 2500 MCG SUBL, Place 2,500 mcg under the tongue daily., Disp: , Rfl:    diltiazem (CARDIZEM CD)  240 MG 24 hr capsule, Take 240 mg by mouth daily., Disp: , Rfl:    gabapentin (NEURONTIN) 800 MG tablet, Take 1 tablet (800 mg total) by mouth 3 (three) times daily., Disp: 270 tablet, Rfl: 1   losartan-hydrochlorothiazide (HYZAAR) 50-12.5 MG tablet, Take 1 tablet by mouth daily., Disp: , Rfl:    metFORMIN (GLUCOPHAGE) 500 MG tablet, Take 500 mg by mouth daily with breakfast., Disp: , Rfl:    montelukast (SINGULAIR) 10 MG tablet, Take 10 mg by mouth daily., Disp: , Rfl:    nortriptyline (PAMELOR) 25 MG capsule, Take 1 capsule (25 mg total) by mouth at bedtime. Three month supply, Disp: 90 capsule, Rfl: 1   omeprazole (PRILOSEC) 40 MG capsule, Take 40 mg by mouth daily., Disp: , Rfl:    oxyCODONE (ROXICODONE) 15 MG immediate release tablet, Take 1 tablet (15 mg total) by mouth 5 (five) times daily as needed for pain. Do Not fill Before 08/23/2023, Disp: 150 tablet, Rfl: 0   promethazine (PHENERGAN) 25 MG tablet, Take 25 mg by mouth as needed. , Disp: , Rfl:    simvastatin (ZOCOR) 10 MG tablet, Take 10 mg by mouth every evening., Disp: , Rfl:    topiramate (TOPAMAX) 25 MG tablet, Take 1 tablet (25 mg total) by mouth 2 (two) times daily., Disp: 180 tablet, Rfl: 3   TRUE METRIX BLOOD GLUCOSE TEST test strip, daily., Disp: , Rfl:    valACYclovir (VALTREX) 500 MG tablet, Take 500 mg by mouth daily. , Disp: , Rfl:  No current facility-administered medications for this visit.  Facility-Administered Medications Ordered  in Other Visits:    levalbuterol (XOPENEX) nebulizer solution 0.63 mg, 0.63 mg, Nebulization, Once, Parrett, Tammy S, NP      Objective:   Vitals:   08/29/23 1415 08/29/23 1416  BP:  132/78  Pulse: 83   Temp:  98.3 F (36.8 C)  TempSrc:  Oral  SpO2: 97%     Estimated body mass index is 27.62 kg/m as calculated from the following:   Height as of 08/03/23: 5\' 5"  (1.651 m).   Weight as of 08/03/23: 166 lb (75.3 kg).  @WEIGHTCHANGE @  [employer]     Physical  Exam   General: No distress. Looks well O2 at rest: no Cane present: no Sitting in wheel chair: no Frail: no Obese: no Neuro: Alert and Oriented x 3. GCS 15. Speech normal Psych: Pleasant Resp:  Barrel Chest - no.  Wheeze - no, Crackles - no, No overt respiratory distress CVS: Normal heart sounds. Murmurs - no Ext: Stigmata of Connective Tissue Disease - no HEENT: Normal upper airway. PEERL +. No post nasal drip        Assessment:       ICD-10-CM   1. Moderate persistent asthma without complication  J45.40 ECHOCARDIOGRAM COMPLETE    2. Family history of cancer  Z80.9 ECHOCARDIOGRAM COMPLETE    3. Cardiomegaly  I51.7 ECHOCARDIOGRAM COMPLETE    4. Skin rash  R21 ECHOCARDIOGRAM COMPLETE         Plan:     Patient Instructions  Moderate persistent asthma without complication  - stable   Plan - - Continue Symbicort and Singulair scheduled - albuterol as needed -   Multiple lung nodules - last CT Dec 2021 Family history of cancer  - brother with recent diagnosis of lung cancer with mets to liver in 09-30-2021 and passed away 12-01-22- dad died of liver cancer - CT chest August 2023  and Feb 2025 without cancer - appreciat your concern of high risk for lung cancer  Plan  - expectant followup  Enlarged heart on CT Aortic atherosclerosis on CT  Plan  - get echo   Rash Right Upper Extermity  Plan - talk to your PCP Toma Deiters, MD   Follow-up - 6  months or sooner if needed   FOLLOWUP No follow-ups on file.    SIGNATURE    Dr. Kalman Shan, M.D., F.C.C.P,  Pulmonary and Critical Care Medicine Staff Physician, [facility] Director - Interstitial Lung Disease  Program  Pulmonary Fibrosis [facility] at [facility] [place], [place], [address]  Pager: [phone], If no answer or between  15:00h - 7:00h: call [phone] Telephone: [phone]  2:41 PM 08/29/2023

## 2023-08-30 NOTE — Progress Notes (Signed)
 Subjective:    Patient ID: [name] [name], female    DOB: 11-18-1950, 73 y.o.   MRN: [id number]  HPI: [name] [name] is a 73 y.o. female who returns for follow up appointment for chronic pain and medication refill. She states her pain is located in her neck radiating into her right shoulder, right arm with tingling, mid- lower back pain radiating into her bilateral lower extremities. She rates her pain 9. Her current exercise regime is walking.  [name] [name] Morphine equivalent is 112.50 MME.   Last UDS was Performed on 07/05/2023, it was consistent.      Pain Inventory Average Pain 7 Pain Right Now 9 My pain is constant, sharp, burning, dull, stabbing, tingling, and aching  In the last 24 hours, has pain interfered with the following? General activity 10 Relation with others 0 Enjoyment of life 7 What TIME of day is your pain at its worst? morning , daytime, evening, and night Sleep (in general) Fair  Pain is worse with: walking, bending, sitting, inactivity, standing, and some activites Pain improves with: rest, therapy/exercise, medication, and heat Relief from Meds: 7  Family History  Problem Relation Age of Onset   Hypertension Mother    Heart disease Mother        Mitral valve replacement.    Diabetes Mother    Liver cancer Father 22       HCC   Diabetes Brother    Lung cancer Brother 37       mets   Melanoma Brother 44       neck and back   Cancer Paternal Uncle        unknown type; dx after 72   Breast cancer Paternal Grandmother        dx 61s   Ovarian cancer Cousin        paternal cousin; d. 30s   Cancer Cousin        GYN; d. 70s   Colon cancer Neg Hx    Colon polyps Neg Hx    Esophageal cancer Neg Hx    Rectal cancer Neg Hx    Stomach cancer Neg Hx    Social History   Socioeconomic History   Marital status: Legally Separated    Spouse name: Not on file   Number of children: Not on file   Years of education: Not on file   Highest education level:  Not on file  Occupational History   Occupation: disable    Employer: DISABLED  Tobacco Use   Smoking status: Never   Smokeless tobacco: Never  Vaping Use   Vaping status: Never Used  Substance and Sexual Activity   Alcohol use: No   Drug use: No   Sexual activity: Not on file    Comment: second hand smoke  Other Topics Concern   Not on file  Social History Narrative   Has one child lives at home with her.    Social Drivers of Corporate investment banker Strain: Not on file  Food Insecurity: Not on file  Transportation Needs: Not on file  Physical Activity: Not on file  Stress: Not on file  Social Connections: Not on file   Past Surgical History:  Procedure Laterality Date   ABDOMINAL HYSTERECTOMY  1987   W/ BSO   ANTERIOR CERVICAL DECOMP/DISCECTOMY FUSION N/A 08/09/2012   Procedure: ANTERIOR CERVICAL DECOMPRESSION/DISCECTOMY FUSION 1 LEVEL;  Surgeon: Karn Cassis, MD;  Location: [facility];  Service: Neurosurgery;  Laterality: N/A;  Cervical four-five  Anterior cervical decompression/diskectomy/fusion   ANTERIOR FUSION CERVICAL SPINE  2007   C5 -6   APPENDECTOMY     APPLICATION OF INTRAOPERATIVE CT SCAN N/A 05/17/2017   Procedure: APPLICATION OF INTRAOPERATIVE CT SCAN;  Surgeon: Maeola Harman, MD;  Location: [facility];  Service: Neurosurgery;  Laterality: N/A;   BACK SURGERY     x 5   BREAST EXCISIONAL BIOPSY Bilateral    No scar seen    BREAST SURGERY     x 2 biopsies   CARPAL TUNNEL RELEASE  RIGHT - 2001  & DEC 2011 W/ BACK SURG.   CERVICAL DISC SURGERY  2005   C5 - 6   CHOLECYSTECTOMY  1994   COLONOSCOPY WITH PROPOFOL N/A 05/16/2022   Procedure: COLONOSCOPY WITH PROPOFOL;  Surgeon: Dolores Frame, MD;  [facility];  Service: Gastroenterology;  Laterality: N/A;  945 ASA 3   DORSAL COMPARTMENT RELEASE Right 04/07/2013   Procedure: RIGHT WRIST STS RELEASE;  Surgeon: Marlowe Shores, MD;  Location: [facility];  Service:  Orthopedics;  Laterality: Right;   ENDOVENOUS ABLATION SAPHENOUS VEIN W/ LASER Right 05/29/2019   endovenous laser ablation right greater saphenous vein by Cari Caraway MD    EXPLORATION OF INCISION FOR CSF LEAK  DEC 2011  X2   POST LAMINECOTMY   FINGER ARTHRODESIS Right 04/07/2013   Procedure: RIGHT INDEX AND RIGHT LONG DISTAL INTERPHALANGEAL JOINT FUSIONS;  Surgeon: Marlowe Shores, MD;  Location: [facility];  Service: Orthopedics;  Laterality: Right;   FINGER ARTHROPLASTY Right 04/07/2013   Procedure: RIGHT THUMB CMC ARTHROPLASTY;  Surgeon: Marlowe Shores, MD;  Location: [facility];  Service: Orthopedics;  Laterality: Right;   GANGLION CYST EXCISION Right 04/07/2013   Procedure: RIGHT WRIST MASS EXCISION;  Surgeon: Marlowe Shores, MD;  Location: [facility];  Service: Orthopedics;  Laterality: Right;   KNEE ARTHROSCOPY  LEFT X3 (LAST ONE 2005)   KNEE ARTHROSCOPY  05/17/2011   Procedure: ARTHROSCOPY KNEE;  Surgeon: Curlene Labrum III;  Location: [facility];  Service: Orthopedics;  Laterality: Right;  WITH MEDIAL MENISECTOMY AND removal of suprapatella fat lump   LEFT WRIST TENOSYNECTOMY W/ LEFT THUMB JOINT REPAIR  02-24-10   LUMBAR FUSION  11-30-10   L4 - 5   LUMBAR FUSION  2000   L2 - 4   LUMBAR LAMINECTOMY  DEC 2011   L4 - 5   POSTERIOR CERVICAL FUSION/FORAMINOTOMY N/A 05/17/2017   Procedure: Cervical five  to Thorastic one  Posterior cervical fusion with lateral mass screws/revision of prior instrumentation;  Surgeon: Maeola Harman, MD;  Location: [facility];  Service: Neurosurgery;  Laterality: N/A;  C5 to T1 Posterior cervical fusion with lateral mass screws/revision of prior instrumentation   TENDON REPAIR  JAN 2010   LEFT INDEX AND LONG FINGERS   THUMB SURGERY   04/07/2019   RIGHT THUMB   TUBAL LIGATION     Past Surgical History:  Procedure Laterality Date   ABDOMINAL HYSTERECTOMY  1987   W/ BSO   ANTERIOR  CERVICAL DECOMP/DISCECTOMY FUSION N/A 08/09/2012   Procedure: ANTERIOR CERVICAL DECOMPRESSION/DISCECTOMY FUSION 1 LEVEL;  Surgeon: Karn Cassis, MD;  Location: [facility];  Service: Neurosurgery;  Laterality: N/A;  Cervical four-five  Anterior cervical decompression/diskectomy/fusion   ANTERIOR FUSION CERVICAL SPINE  2007   C5 -6   APPENDECTOMY     APPLICATION OF INTRAOPERATIVE CT SCAN N/A  05/17/2017   Procedure: APPLICATION OF INTRAOPERATIVE CT SCAN;  Surgeon: Maeola Harman, MD;  Location: [facility] OR;  Service: Neurosurgery;  Laterality: N/A;   BACK SURGERY     x 5   BREAST EXCISIONAL BIOPSY Bilateral    No scar seen    BREAST SURGERY     x 2 biopsies   CARPAL TUNNEL RELEASE  RIGHT - 2001  & DEC 2011 W/ BACK SURG.   CERVICAL DISC SURGERY  2005   C5 - 6   CHOLECYSTECTOMY  1994   COLONOSCOPY WITH PROPOFOL N/A 05/16/2022   Procedure: COLONOSCOPY WITH PROPOFOL;  Surgeon: Dolores Frame, MD;  [facility];  Service: Gastroenterology;  Laterality: N/A;  945 ASA 3   DORSAL COMPARTMENT RELEASE Right 04/07/2013   Procedure: RIGHT WRIST STS RELEASE;  Surgeon: Marlowe Shores, MD;  Location: [facility];  Service: Orthopedics;  Laterality: Right;   ENDOVENOUS ABLATION SAPHENOUS VEIN W/ LASER Right 05/29/2019   endovenous laser ablation right greater saphenous vein by Cari Caraway MD    EXPLORATION OF INCISION FOR CSF LEAK  DEC 2011  X2   POST LAMINECOTMY   FINGER ARTHRODESIS Right 04/07/2013   Procedure: RIGHT INDEX AND RIGHT LONG DISTAL INTERPHALANGEAL JOINT FUSIONS;  Surgeon: Marlowe Shores, MD;  Location: [facility];  Service: Orthopedics;  Laterality: Right;   FINGER ARTHROPLASTY Right 04/07/2013   Procedure: RIGHT THUMB CMC ARTHROPLASTY;  Surgeon: Marlowe Shores, MD;  Location: [facility];  Service: Orthopedics;  Laterality: Right;   GANGLION CYST EXCISION Right 04/07/2013   Procedure: RIGHT WRIST MASS EXCISION;   Surgeon: Marlowe Shores, MD;  Location: [facility];  Service: Orthopedics;  Laterality: Right;   KNEE ARTHROSCOPY  LEFT X3 (LAST ONE 2005)   KNEE ARTHROSCOPY  05/17/2011   Procedure: ARTHROSCOPY KNEE;  Surgeon: Curlene Labrum III;  Location: [facility];  Service: Orthopedics;  Laterality: Right;  WITH MEDIAL MENISECTOMY AND removal of suprapatella fat lump   LEFT WRIST TENOSYNECTOMY W/ LEFT THUMB JOINT REPAIR  02-24-10   LUMBAR FUSION  11-30-10   L4 - 5   LUMBAR FUSION  2000   L2 - 4   LUMBAR LAMINECTOMY  DEC 2011   L4 - 5   POSTERIOR CERVICAL FUSION/FORAMINOTOMY N/A 05/17/2017   Procedure: Cervical five  to Thorastic one  Posterior cervical fusion with lateral mass screws/revision of prior instrumentation;  Surgeon: Maeola Harman, MD;  Location: [facility] OR;  Service: Neurosurgery;  Laterality: N/A;  C5 to T1 Posterior cervical fusion with lateral mass screws/revision of prior instrumentation   TENDON REPAIR  JAN 2010   LEFT INDEX AND LONG FINGERS   THUMB SURGERY   04/07/2019   RIGHT THUMB   TUBAL LIGATION     Past Medical History:  Diagnosis Date   Acute sinusitis, unspecified    Allergy    Anemia    "quite a few times"   Anxiety state, unspecified    Arachnoiditis BILATERAL LEGS   DUE TO MULTIPLE BACK SURG.'S   Arthritis    Asthma    last flare up was 03/2017 lasted over a month   Blood transfusion    Cancer (HCC)    skin cancers (in scalp)   Cardiomyopathy HX --06/2010   EF was 25% during acute illness (PHELONEPHRITIS) Repeat echo 12-06-10 60% showed normal EF.    Chronic back pain greater than 3 months duration    S/P BACK SURG'S  CSF leak    Diabetes mellitus without complication (HCC)    dx 2013 type 2   Dyslipidemia    Dysphagia    some post op cerv fusion 2/14   Dysrhythmia    Essential hypertension, benign    Family history of adverse reaction to anesthesia    mother gets n/v   Family history of breast cancer 02/06/2022   Family  history of liver cancer 02/06/2022   Family history of lung cancer 02/06/2022   Family history of ovarian cancer 02/06/2022   GERD (gastroesophageal reflux disease) AND HIATIAL HERNIA   CONTROLLED W/ NEXIUM   Headache(784.0)    History of chronic bronchitis    Hx of bladder infections    Hyperlipidemia    TAKE CHLOSTEROL MEDICATION,04/23/19   Hypertension    Neuromuscular disorder (HCC)    numbness and tingling   Osteoporosis    Other malaise and fatigue    PONV (postoperative nausea and vomiting)    Shortness of breath    Spinal headache    Spinal stenosis, cervical region    Varicosities    venous   Weakness of both legs DUE TO ARACHNOIDITIS   OCCASIONAL USES CANE   There were no vitals taken for this visit.  Opioid Risk Score:   Fall Risk Score:  `1  Depression screen PHQ 2/9     07/05/2023    2:51 PM 05/04/2023    1:27 PM 04/06/2023    1:30 PM 03/06/2023    1:23 PM 12/04/2022    1:27 PM 11/03/2022    1:19 PM 10/03/2022    1:11 PM  Depression screen PHQ 2/9  Decreased Interest 0 1 0 0 1 0 0  Down, Depressed, Hopeless 0 1 0 0 1 0 0  PHQ - 2 Score 0 2 0 0 2 0 0    Review of Systems  Musculoskeletal:  Positive for arthralgias, back pain and gait problem.       Right arm & right shoulder   All other systems reviewed and are negative.      Objective:   Physical Exam Vitals and nursing note reviewed.  Constitutional:      Appearance: Normal appearance.  Cardiovascular:     Rate and Rhythm: Normal rate and regular rhythm.     Pulses: Normal pulses.     Heart sounds: Normal heart sounds.  Pulmonary:     Effort: Pulmonary effort is normal.     Breath sounds: Normal breath sounds.  Musculoskeletal:     Comments: Normal Muscle Bulk and Muscle Testing Reveals:  Upper Extremities:Right: Decreased  ROM 90 Degrees and Muscle Strength 3/5 Left upper Extremity: Full ROM and Muscle Strength 5/5 Thoracic Paraspinal Tenderness: T-7-T-10 Lumbar Paraspinal Tenderness:  L-3-L-5 Lower Extremities: Full ROM and Muscle Strength 5/5 Arises from Table slowly Narrow Based  Gait     Skin:    General: Skin is warm and dry.  Neurological:     Mental Status: She is alert and oriented to person, place, and time.  Psychiatric:        Mood and Affect: Mood normal.        Behavior: Behavior normal.         Assessment & Plan:  1. On 05/17/2017 :C5C6C7 T1 Posterior Cervical Fusion with lateral mass fixation with [facility], revision of prior instrumentation. By Dr. Venetia Maxon.  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 Continue current medication regimen with  Gabapentin 800 mg TID. Refilled: Oxycodone  15mg  one tablet 5 times a day  as needed for pain # 150 tablets We will continue the opioid monitoring program, this consists of regular clinic visits, examinations, urine drug screen, pill counts as well as use of [place] Controlled Substance Reporting system. 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 Continue to Monitor. 08/03/2023 3. Anxiety/depression: PCP Following. Continue to monitor. 08/31/2023. 4. Muscle Spasms: Continue current medication regime with Flexeril. Continue to Monitor. 08/31/2023 5. Cervicalgia/ Cervical Radiculitis: Dr Venetia Maxon Following. ,Continue current medication regime with Gabapentin:  S/P  C5C6C7 T1 Posterior Cervical Fusion with lateral mass fixation with AIRO Imaging, revision of prior instrumentation by Dr. Venetia Maxon on 05/17/2017. 08/31/2023 7. Bilateral Thoracic Back Pain: Continue HEP as Tolerated and Continue current medication regimen. Continue to Monitor. 08/31/2023  8. Left lower extremity DVT/ Phlebitis:  S/P endovenous laser ablation of left great saphenous vein on 05/09/2018 by Dr. Edilia Bo:  Vascular  Following.  08/31/2023. 9. Insomnia: Continue Pamelor. Continue to Monitor. 08/31/2023. 10. Acute Right Shoulder Pain / Right Shoulder Tendonitis: [facility] Following.Continue to monitor.  08/31/2023.   F/U in 1 month

## 2023-08-31 ENCOUNTER — Encounter: Payer: Self-pay | Admitting: Registered Nurse

## 2023-08-31 ENCOUNTER — Encounter: Payer: Medicare Other | Attending: Physical Medicine & Rehabilitation | Admitting: Registered Nurse

## 2023-08-31 VITALS — BP 144/85 | HR 80 | Ht 65.0 in | Wt 163.0 lb

## 2023-08-31 DIAGNOSIS — G894 Chronic pain syndrome: Secondary | ICD-10-CM

## 2023-08-31 DIAGNOSIS — M25511 Pain in right shoulder: Secondary | ICD-10-CM

## 2023-08-31 DIAGNOSIS — G8929 Other chronic pain: Secondary | ICD-10-CM

## 2023-08-31 DIAGNOSIS — M25512 Pain in left shoulder: Secondary | ICD-10-CM | POA: Diagnosis not present

## 2023-08-31 DIAGNOSIS — M5416 Radiculopathy, lumbar region: Secondary | ICD-10-CM | POA: Diagnosis not present

## 2023-08-31 DIAGNOSIS — Z79891 Long term (current) use of opiate analgesic: Secondary | ICD-10-CM

## 2023-08-31 DIAGNOSIS — M542 Cervicalgia: Secondary | ICD-10-CM | POA: Diagnosis not present

## 2023-08-31 DIAGNOSIS — M961 Postlaminectomy syndrome, not elsewhere classified: Secondary | ICD-10-CM | POA: Diagnosis not present

## 2023-08-31 DIAGNOSIS — G039 Meningitis, unspecified: Secondary | ICD-10-CM

## 2023-08-31 DIAGNOSIS — Z5181 Encounter for therapeutic drug level monitoring: Secondary | ICD-10-CM | POA: Diagnosis not present

## 2023-08-31 DIAGNOSIS — M546 Pain in thoracic spine: Secondary | ICD-10-CM | POA: Insufficient documentation

## 2023-08-31 MED ORDER — OXYCODONE HCL 15 MG PO TABS: 15.0000 mg | ORAL_TABLET | Freq: Every day | ORAL | 0 refills | Status: DC | PRN

## 2023-08-31 MED ORDER — NORTRIPTYLINE HCL 25 MG PO CAPS: 25.0000 mg | ORAL_CAPSULE | Freq: Every day | ORAL | 1 refills | Status: DC

## 2023-09-03 ENCOUNTER — Telehealth (HOSPITAL_BASED_OUTPATIENT_CLINIC_OR_DEPARTMENT_OTHER): Payer: Self-pay | Admitting: Cardiology

## 2023-09-03 NOTE — Telephone Encounter (Signed)
 Patient called to schedule her pre-op echo and also wanted to schedule an appt with Dr Antoine Poche to review the results from her recent chest CT; she has a couple of questions re the findings of an enlarged heart and plaque. Suggested that pt send a MyChart message but pt wants a face-to-face conversation. Also, first available appts are end of May and pt was hoping to be seen sooner. Discovered that pt has not seen Dr Antoine Poche since approx 2021 - would she be considered a new pt? Please reach out to pt to let her know abt next steps.

## 2023-09-07 ENCOUNTER — Ambulatory Visit (HOSPITAL_COMMUNITY)

## 2023-09-10 DIAGNOSIS — E7849 Other hyperlipidemia: Secondary | ICD-10-CM | POA: Diagnosis not present

## 2023-09-10 DIAGNOSIS — I10 Essential (primary) hypertension: Secondary | ICD-10-CM | POA: Diagnosis not present

## 2023-09-10 DIAGNOSIS — E1122 Type 2 diabetes mellitus with diabetic chronic kidney disease: Secondary | ICD-10-CM | POA: Diagnosis not present

## 2023-09-10 DIAGNOSIS — M818 Other osteoporosis without current pathological fracture: Secondary | ICD-10-CM | POA: Diagnosis not present

## 2023-09-10 DIAGNOSIS — E1143 Type 2 diabetes mellitus with diabetic autonomic (poly)neuropathy: Secondary | ICD-10-CM | POA: Diagnosis not present

## 2023-09-10 DIAGNOSIS — N182 Chronic kidney disease, stage 2 (mild): Secondary | ICD-10-CM | POA: Diagnosis not present

## 2023-09-10 DIAGNOSIS — Z0000 Encounter for general adult medical examination without abnormal findings: Secondary | ICD-10-CM | POA: Diagnosis not present

## 2023-09-10 DIAGNOSIS — Z1331 Encounter for screening for depression: Secondary | ICD-10-CM | POA: Diagnosis not present

## 2023-09-10 DIAGNOSIS — N390 Urinary tract infection, site not specified: Secondary | ICD-10-CM | POA: Diagnosis not present

## 2023-09-18 DIAGNOSIS — M75121 Complete rotator cuff tear or rupture of right shoulder, not specified as traumatic: Secondary | ICD-10-CM | POA: Diagnosis not present

## 2023-09-25 ENCOUNTER — Ambulatory Visit: Payer: Medicare Other | Admitting: Rheumatology

## 2023-09-25 ENCOUNTER — Encounter: Attending: Physical Medicine & Rehabilitation | Admitting: Registered Nurse

## 2023-09-25 ENCOUNTER — Encounter: Payer: Self-pay | Admitting: Registered Nurse

## 2023-09-25 VITALS — BP 139/81 | HR 82 | Ht 65.0 in | Wt 163.0 lb

## 2023-09-25 DIAGNOSIS — G039 Meningitis, unspecified: Secondary | ICD-10-CM

## 2023-09-25 DIAGNOSIS — G8929 Other chronic pain: Secondary | ICD-10-CM

## 2023-09-25 DIAGNOSIS — M5416 Radiculopathy, lumbar region: Secondary | ICD-10-CM

## 2023-09-25 DIAGNOSIS — M25511 Pain in right shoulder: Secondary | ICD-10-CM | POA: Diagnosis not present

## 2023-09-25 DIAGNOSIS — Z5181 Encounter for therapeutic drug level monitoring: Secondary | ICD-10-CM | POA: Diagnosis not present

## 2023-09-25 DIAGNOSIS — Z79891 Long term (current) use of opiate analgesic: Secondary | ICD-10-CM | POA: Diagnosis not present

## 2023-09-25 DIAGNOSIS — G894 Chronic pain syndrome: Secondary | ICD-10-CM

## 2023-09-25 DIAGNOSIS — M546 Pain in thoracic spine: Secondary | ICD-10-CM | POA: Diagnosis not present

## 2023-09-25 DIAGNOSIS — M542 Cervicalgia: Secondary | ICD-10-CM

## 2023-09-25 MED ORDER — OXYCODONE HCL 15 MG PO TABS: 15.0000 mg | ORAL_TABLET | Freq: Every day | ORAL | 0 refills | Status: DC | PRN

## 2023-09-25 NOTE — Progress Notes (Signed)
 Subjective:    Patient ID: [name] [name], female    DOB: 04-24-1951, 73 y.o.   MRN: [id number]  HPI: [name] [name] is a 73 y.o. female who returns for follow up appointment for chronic pain and medication refill. She states her pain is located in her right shoulder, Ortho Following. Left shoulder, upper- lower back radiating into her bilateral lower extremities R>L, she describes her leg pain as achy pain after walking for long periods, usually at night R>L. She rates her pain 7. Her current exercise regime is walking and performing stretching exercises.  [name] [name] Morphine equivalent is 112.50 MME.   Last UDS was Performed on 07/05/2023, it was consistent.      Pain Inventory Average Pain 7 Pain Right Now 7 My pain is intermittent, constant, sharp, burning, stabbing, and aching  In the last 24 hours, has pain interfered with the following? General activity 8 Relation with others 7 Enjoyment of life 7 What TIME of day is your pain at its worst? morning , daytime, evening, and night Sleep (in general) Fair  Pain is worse with: walking, bending, sitting, inactivity, standing, and some activites Pain improves with: rest, pacing activities, and medication Relief from Meds: 7  Family History  Problem Relation Age of Onset   Hypertension Mother    Heart disease Mother        Mitral valve replacement.    Diabetes Mother    Liver cancer Father 37       HCC   Diabetes Brother    Lung cancer Brother 33       mets   Melanoma Brother 39       neck and back   Cancer Paternal Uncle        unknown type; dx after 93   Breast cancer Paternal Grandmother        dx 68s   Ovarian cancer Cousin        paternal cousin; d. 30s   Cancer Cousin        GYN; d. 34s   Colon cancer Neg Hx    Colon polyps Neg Hx    Esophageal cancer Neg Hx    Rectal cancer Neg Hx    Stomach cancer Neg Hx    Social History   Socioeconomic History   Marital status: Legally Separated    Spouse name: Not on  file   Number of children: Not on file   Years of education: Not on file   Highest education level: Not on file  Occupational History   Occupation: disable    Employer: DISABLED  Tobacco Use   Smoking status: Never   Smokeless tobacco: Never  Vaping Use   Vaping status: Never Used  Substance and Sexual Activity   Alcohol use: No   Drug use: No   Sexual activity: Not on file    Comment: second hand smoke  Other Topics Concern   Not on file  Social History Narrative   Has one child lives at home with her.    Social Drivers of Corporate investment banker Strain: Not on file  Food Insecurity: Not on file  Transportation Needs: Not on file  Physical Activity: Not on file  Stress: Not on file  Social Connections: Not on file   Past Surgical History:  Procedure Laterality Date   ABDOMINAL HYSTERECTOMY  1987   W/ BSO   ANTERIOR CERVICAL DECOMP/DISCECTOMY FUSION N/A 08/09/2012   Procedure: ANTERIOR CERVICAL DECOMPRESSION/DISCECTOMY FUSION 1  LEVEL;  Surgeon: Karn Cassis, MD;  Location: [facility];  Service: Neurosurgery;  Laterality: N/A;  Cervical four-five  Anterior cervical decompression/diskectomy/fusion   ANTERIOR FUSION CERVICAL SPINE  2007   C5 -6   APPENDECTOMY     APPLICATION OF INTRAOPERATIVE CT SCAN N/A 05/17/2017   Procedure: APPLICATION OF INTRAOPERATIVE CT SCAN;  Surgeon: Maeola Harman, MD;  Location: [facility] OR;  Service: Neurosurgery;  Laterality: N/A;   BACK SURGERY     x 5   BREAST EXCISIONAL BIOPSY Bilateral    No scar seen    BREAST SURGERY     x 2 biopsies   CARPAL TUNNEL RELEASE  RIGHT - 2001  & DEC 2011 W/ BACK SURG.   CERVICAL DISC SURGERY  2005   C5 - 6   CHOLECYSTECTOMY  1994   COLONOSCOPY WITH PROPOFOL N/A 05/16/2022   Procedure: COLONOSCOPY WITH PROPOFOL;  Surgeon: Dolores Frame, MD;  [facility];  Service: Gastroenterology;  Laterality: N/A;  945 ASA 3   DORSAL COMPARTMENT RELEASE Right 04/07/2013   Procedure: RIGHT  WRIST STS RELEASE;  Surgeon: Marlowe Shores, MD;  Location: [facility];  Service: Orthopedics;  Laterality: Right;   ENDOVENOUS ABLATION SAPHENOUS VEIN W/ LASER Right 05/29/2019   endovenous laser ablation right greater saphenous vein by Cari Caraway MD    EXPLORATION OF INCISION FOR CSF LEAK  DEC 2011  X2   POST LAMINECOTMY   FINGER ARTHRODESIS Right 04/07/2013   Procedure: RIGHT INDEX AND RIGHT LONG DISTAL INTERPHALANGEAL JOINT FUSIONS;  Surgeon: Marlowe Shores, MD;  Location: [facility];  Service: Orthopedics;  Laterality: Right;   FINGER ARTHROPLASTY Right 04/07/2013   Procedure: RIGHT THUMB CMC ARTHROPLASTY;  Surgeon: Marlowe Shores, MD;  Location: [facility];  Service: Orthopedics;  Laterality: Right;   GANGLION CYST EXCISION Right 04/07/2013   Procedure: RIGHT WRIST MASS EXCISION;  Surgeon: Marlowe Shores, MD;  Location: [facility];  Service: Orthopedics;  Laterality: Right;   KNEE ARTHROSCOPY  LEFT X3 (LAST ONE 2005)   KNEE ARTHROSCOPY  05/17/2011   Procedure: ARTHROSCOPY KNEE;  Surgeon: Curlene Labrum III;  Location: [facility];  Service: Orthopedics;  Laterality: Right;  WITH MEDIAL MENISECTOMY AND removal of suprapatella fat lump   LEFT WRIST TENOSYNECTOMY W/ LEFT THUMB JOINT REPAIR  02-24-10   LUMBAR FUSION  11-30-10   L4 - 5   LUMBAR FUSION  2000   L2 - 4   LUMBAR LAMINECTOMY  DEC 2011   L4 - 5   POSTERIOR CERVICAL FUSION/FORAMINOTOMY N/A 05/17/2017   Procedure: Cervical five  to Thorastic one  Posterior cervical fusion with lateral mass screws/revision of prior instrumentation;  Surgeon: Maeola Harman, MD;  Location: [facility] OR;  Service: Neurosurgery;  Laterality: N/A;  C5 to T1 Posterior cervical fusion with lateral mass screws/revision of prior instrumentation   TENDON REPAIR  JAN 2010   LEFT INDEX AND LONG FINGERS   THUMB SURGERY   04/07/2019   RIGHT THUMB   TUBAL LIGATION     Past  Surgical History:  Procedure Laterality Date   ABDOMINAL HYSTERECTOMY  1987   W/ BSO   ANTERIOR CERVICAL DECOMP/DISCECTOMY FUSION N/A 08/09/2012   Procedure: ANTERIOR CERVICAL DECOMPRESSION/DISCECTOMY FUSION 1 LEVEL;  Surgeon: Karn Cassis, MD;  Location: [facility];  Service: Neurosurgery;  Laterality: N/A;  Cervical four-five  Anterior cervical decompression/diskectomy/fusion   ANTERIOR FUSION CERVICAL SPINE  2007  C5 -6   APPENDECTOMY     APPLICATION OF INTRAOPERATIVE CT SCAN N/A 05/17/2017   Procedure: APPLICATION OF INTRAOPERATIVE CT SCAN;  Surgeon: Maeola Harman, MD;  Location: [facility] OR;  Service: Neurosurgery;  Laterality: N/A;   BACK SURGERY     x 5   BREAST EXCISIONAL BIOPSY Bilateral    No scar seen    BREAST SURGERY     x 2 biopsies   CARPAL TUNNEL RELEASE  RIGHT - 2001  & DEC 2011 W/ BACK SURG.   CERVICAL DISC SURGERY  2005   C5 - 6   CHOLECYSTECTOMY  1994   COLONOSCOPY WITH PROPOFOL N/A 05/16/2022   Procedure: COLONOSCOPY WITH PROPOFOL;  Surgeon: Dolores Frame, MD;  [facility];  Service: Gastroenterology;  Laterality: N/A;  945 ASA 3   DORSAL COMPARTMENT RELEASE Right 04/07/2013   Procedure: RIGHT WRIST STS RELEASE;  Surgeon: Marlowe Shores, MD;  Location: [facility];  Service: Orthopedics;  Laterality: Right;   ENDOVENOUS ABLATION SAPHENOUS VEIN W/ LASER Right 05/29/2019   endovenous laser ablation right greater saphenous vein by Cari Caraway MD    EXPLORATION OF INCISION FOR CSF LEAK  DEC 2011  X2   POST LAMINECOTMY   FINGER ARTHRODESIS Right 04/07/2013   Procedure: RIGHT INDEX AND RIGHT LONG DISTAL INTERPHALANGEAL JOINT FUSIONS;  Surgeon: Marlowe Shores, MD;  Location: [facility];  Service: Orthopedics;  Laterality: Right;   FINGER ARTHROPLASTY Right 04/07/2013   Procedure: RIGHT THUMB CMC ARTHROPLASTY;  Surgeon: Marlowe Shores, MD;  Location: [facility];  Service: Orthopedics;   Laterality: Right;   GANGLION CYST EXCISION Right 04/07/2013   Procedure: RIGHT WRIST MASS EXCISION;  Surgeon: Marlowe Shores, MD;  Location: [facility];  Service: Orthopedics;  Laterality: Right;   KNEE ARTHROSCOPY  LEFT X3 (LAST ONE 2005)   KNEE ARTHROSCOPY  05/17/2011   Procedure: ARTHROSCOPY KNEE;  Surgeon: Curlene Labrum III;  Location: [facility];  Service: Orthopedics;  Laterality: Right;  WITH MEDIAL MENISECTOMY AND removal of suprapatella fat lump   LEFT WRIST TENOSYNECTOMY W/ LEFT THUMB JOINT REPAIR  02-24-10   LUMBAR FUSION  11-30-10   L4 - 5   LUMBAR FUSION  2000   L2 - 4   LUMBAR LAMINECTOMY  DEC 2011   L4 - 5   POSTERIOR CERVICAL FUSION/FORAMINOTOMY N/A 05/17/2017   Procedure: Cervical five  to Thorastic one  Posterior cervical fusion with lateral mass screws/revision of prior instrumentation;  Surgeon: Maeola Harman, MD;  Location: [facility] OR;  Service: Neurosurgery;  Laterality: N/A;  C5 to T1 Posterior cervical fusion with lateral mass screws/revision of prior instrumentation   TENDON REPAIR  JAN 2010   LEFT INDEX AND LONG FINGERS   THUMB SURGERY   04/07/2019   RIGHT THUMB   TUBAL LIGATION     Past Medical History:  Diagnosis Date   Acute sinusitis, unspecified    Allergy    Anemia    "quite a few times"   Anxiety state, unspecified    Arachnoiditis BILATERAL LEGS   DUE TO MULTIPLE BACK SURG.'S   Arthritis    Asthma    last flare up was 03/2017 lasted over a month   Blood transfusion    Cancer (HCC)    skin cancers (in scalp)   Cardiomyopathy HX --06/2010   EF was 25% during acute illness (PHELONEPHRITIS) Repeat echo 12-06-10 60% showed normal EF.  Chronic back pain greater than 3 months duration    S/P BACK SURG'S   CSF leak    Diabetes mellitus without complication (HCC)    dx 2013 type 2   Dyslipidemia    Dysphagia    some post op cerv fusion 2/14   Dysrhythmia    Essential hypertension, benign    Family history of  adverse reaction to anesthesia    mother gets n/v   Family history of breast cancer 02/06/2022   Family history of liver cancer 02/06/2022   Family history of lung cancer 02/06/2022   Family history of ovarian cancer 02/06/2022   GERD (gastroesophageal reflux disease) AND HIATIAL HERNIA   CONTROLLED W/ NEXIUM   Headache(784.0)    History of chronic bronchitis    Hx of bladder infections    Hyperlipidemia    TAKE CHLOSTEROL MEDICATION,04/23/19   Hypertension    Neuromuscular disorder (HCC)    numbness and tingling   Osteoporosis    Other malaise and fatigue    PONV (postoperative nausea and vomiting)    Shortness of breath    Spinal headache    Spinal stenosis, cervical region    Varicosities    venous   Weakness of both legs DUE TO ARACHNOIDITIS   OCCASIONAL USES CANE   There were no vitals taken for this visit.  Opioid Risk Score:   Fall Risk Score:  `1  Depression screen PHQ 2/9     08/31/2023    1:44 PM 07/05/2023    2:51 PM 05/04/2023    1:27 PM 04/06/2023    1:30 PM 03/06/2023    1:23 PM 12/04/2022    1:27 PM 11/03/2022    1:19 PM  Depression screen PHQ 2/9  Decreased Interest 1 0 1 0 0 1 0  Down, Depressed, Hopeless 1 0 1 0 0 1 0  PHQ - 2 Score 2 0 2 0 0 2 0    Review of Systems  Musculoskeletal:  Positive for back pain and neck pain.       PAIN IN THE  RIGHT SHOULDER PAIN DOWN RIGHT ARM PAIN IN BOTH LEGS DOWN ANKLES  All other systems reviewed and are negative.      Objective:   Physical Exam Vitals and nursing note reviewed.  Constitutional:      Appearance: Normal appearance.  Cardiovascular:     Rate and Rhythm: Normal rate and regular rhythm.     Pulses: Normal pulses.     Heart sounds: Normal heart sounds.  Pulmonary:     Effort: Pulmonary effort is normal.     Breath sounds: Normal breath sounds.  Musculoskeletal:     Comments: Normal Muscle Bulk and Muscle Testing Reveals:  Upper Extremities: Right: Decreased ROM  30 Degrees and Muscle  Strength 4/5 Left Upper Extremity: Full ROM and Muscle Strength 5/5  Thoracic Paraspinal Tenderness : T-7-T-10 Lumbar Paraspinal Tenderness: L-3-L-5 Lower Extremities: Full ROM and Muscle Strength 5/5 Arises from Table with ease Narrow Based  Gait     Skin:    General: Skin is warm and dry.  Neurological:     Mental Status: She is alert and oriented to person, place, and time.  Psychiatric:        Mood and Affect: Mood normal.        Behavior: Behavior normal.         Assessment & Plan:  .1.  On 05/17/2017 :C5C6C7 T1 Posterior Cervical Fusion with lateral mass fixation with [employer],  revision of prior instrumentation. By Dr. Venetia Maxon.  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 Continue current medication regimen with  Gabapentin 800 mg TID. Refilled: Oxycodone 15mg  one tablet 5 times a day  as needed for pain # 150 tablets We will continue the opioid monitoring program, this consists of regular clinic visits, examinations, urine drug screen, pill counts as well as use of [place] Controlled Substance Reporting system. 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 Continue to Monitor. 08/03/2023 3. Anxiety/depression: PCP Following. Continue to monitor. 09/25/2023. 4. Muscle Spasms: Continue current medication regime with Flexeril. Continue to Monitor. 09/25/2023 5. Cervicalgia/ Cervical Radiculitis: Dr Venetia Maxon Following. ,Continue current medication regime with Gabapentin:  S/P  C5C6C7 T1 Posterior Cervical Fusion with lateral mass fixation with [facility] Imaging, revision of prior instrumentation by Dr. Venetia Maxon on 05/17/2017. 09/25/2023 7. Bilateral Thoracic Back Pain: Continue HEP as Tolerated and Continue current medication regimen. Continue to Monitor. 09/25/2023  8.  Left lower extremity DVT/ Phlebitis:  S/P endovenous laser ablation of left great saphenous vein on 05/09/2018 by Dr. Edilia Bo:  Vascular  Following. 09/25/2023. 9. Insomnia: Continue Pamelor. Continue to Monitor. 09/25/2023. 10. Acute Right Shoulder Pain / Right Shoulder Tendonitis: [facility] Following.Continue to monitor.  09/25/2023.   F/U in 1 month

## 2023-09-27 ENCOUNTER — Encounter: Admitting: Registered Nurse

## 2023-10-01 ENCOUNTER — Ambulatory Visit (HOSPITAL_COMMUNITY)

## 2023-10-01 ENCOUNTER — Encounter: Payer: Self-pay | Admitting: Cardiology

## 2023-10-01 ENCOUNTER — Ambulatory Visit: Attending: Cardiology | Admitting: Cardiology

## 2023-10-01 VITALS — BP 138/84 | HR 81 | Resp 16 | Ht 65.0 in | Wt 158.8 lb

## 2023-10-01 DIAGNOSIS — I10 Essential (primary) hypertension: Secondary | ICD-10-CM | POA: Diagnosis not present

## 2023-10-01 DIAGNOSIS — Z01810 Encounter for preprocedural cardiovascular examination: Secondary | ICD-10-CM | POA: Insufficient documentation

## 2023-10-01 DIAGNOSIS — E782 Mixed hyperlipidemia: Secondary | ICD-10-CM | POA: Diagnosis not present

## 2023-10-01 DIAGNOSIS — I517 Cardiomegaly: Secondary | ICD-10-CM | POA: Diagnosis not present

## 2023-10-01 DIAGNOSIS — I700 Atherosclerosis of aorta: Secondary | ICD-10-CM | POA: Diagnosis not present

## 2023-10-01 DIAGNOSIS — E119 Type 2 diabetes mellitus without complications: Secondary | ICD-10-CM | POA: Diagnosis not present

## 2023-10-01 NOTE — Progress Notes (Signed)
 Cardiology Office Note:    Date:  10/01/2023  NAME:  [name] [name]    MRN: [id number] DOB:  April 24, 1951   PCP:  Toma Deiters, MD  Former Cardiology Providers: Dr. Antoine Poche Primary Cardiologist:  Tessa Lerner, DO, [facility] (established care 10/01/2023) Electrophysiologist:  None   Referring MD: Toma Deiters, MD  Reason of Consult: Enlarged heart, mitral valve prolapse  Chief Complaint  Patient presents with   Mitral Valve Prolapse   New Patient (Initial Visit)    Enlarged heart    History of Present Illness:    [name] [name] is a 73 y.o. 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 She is being seen today for the evaluation of enlarged heart noted on CT scan at the request of Hasanaj, Myra Gianotti, MD.  Patient states that she follows up with her pulmonologist Dr. Marchelle Gearing who ordered a CT of the chest without contrast in February/March 2025 and radiology commented on the report that she has an enlarged heart.  She has a history of recovered cardiomyopathy and heart failure and was concerned and referred to cardiology for further evaluation and management.  I am seeing her for the first time in consultation but she has been evaluated by other cardiologist from my practice in the past.  It appears that in 2012 patient was sick, was in sepsis, had multiple back surgeries during which time an echocardiogram was performed which noted severely reduced LVEF at approximately 25%.  However shortly thereafter the same year she had a repeat echocardiogram when she was back to baseline and her LVEF was preserved.  She has no prior history of coronary artery disease or prior coronary interventions.  She also informs me that she is planning  to have a rotator cuff surgery in July 2025.  She is currently walking her dog 0.5 miles per day.  The surgery is to be determined.  She will have it with Dr. Aundria Rud from [facility].  Endorses that she has mitral valve prolapse but this was not demonstrated on her last echo from 2018.  Current Medications: Current Meds  Medication Sig   albuterol (PROVENTIL) (2.5 MG/3ML) 0.083% nebulizer solution Take 2.5 mg by nebulization every 4 (four) hours as needed for wheezing or shortness of breath.   albuterol (VENTOLIN HFA) 108 (90 Base) MCG/ACT inhaler INHALE 2 PUFFS BY MOUTH EVERY 6 HOURS AS NEEDED FOR WHEEZING OR SHORTNESS OF BREATH   aspirin EC 81 MG tablet Take 81 mg by mouth at bedtime.    Biotin 1000 MCG tablet Take 1,000 mcg by mouth daily.   budesonide-formoterol (SYMBICORT) 160-4.5 MCG/ACT inhaler Inhale 2 puffs into the lungs 2 (two) times daily.   diltiazem (CARDIZEM CD) 120 MG 24 hr capsule Take 120 mg by mouth daily.   gabapentin (NEURONTIN) 800 MG tablet Take 1 tablet (800 mg total) by mouth 3 (three) times daily.   losartan-hydrochlorothiazide (HYZAAR) 50-12.5 MG tablet Take 1 tablet by mouth daily.   metFORMIN (GLUCOPHAGE) 500 MG tablet Take 500 mg by mouth daily with breakfast.   milk thistle 175 MG tablet Take by mouth daily.   montelukast (SINGULAIR) 10 MG tablet Take 10 mg by mouth daily.   nortriptyline (PAMELOR) 25 MG capsule Take 1 capsule (25 mg total) by mouth at bedtime. Three month  supply   omeprazole (PRILOSEC) 40 MG capsule Take 40 mg by mouth daily.   oxyCODONE (ROXICODONE) 15 MG immediate release tablet Take 1 tablet (15 mg total) by mouth 5 (five) times daily as needed for pain.   simvastatin (ZOCOR) 10 MG tablet Take 10 mg by mouth every evening.   topiramate (TOPAMAX) 25 MG tablet Take 1 tablet (25 mg total) by mouth 2 (two) times daily.   TRUE METRIX BLOOD GLUCOSE TEST test strip daily.   valACYclovir (VALTREX) 500 MG tablet Take 500 mg by mouth daily.       Allergies:    Latex, Morphine and codeine, Acetaminophen, Hydrocodone, Hydrocodone-acetaminophen, Morphine, Oxycodone hcl, Oxycodone-acetaminophen, Oxycodone-aspirin, Aspirin, Clarithromycin, Clarithromycin, Codeine, Darvocet [propoxyphene n-acetaminophen], Hydrocodone-acetaminophen, Lortab [hydrocodone-acetaminophen], Percocet [oxycodone-acetaminophen], and Tramadol   Past Medical History: Past Medical History:  Diagnosis Date   Acute sinusitis, unspecified    Allergy    Anemia    "quite a few times"   Anxiety state, unspecified    Arachnoiditis BILATERAL LEGS   DUE TO MULTIPLE BACK SURG.'S   Arthritis    Asthma    last flare up was 03/2017 lasted over a month   Blood transfusion    Cancer (HCC)    skin cancers (in scalp)   Cardiomyopathy HX --06/2010   EF was 25% during acute illness (PHELONEPHRITIS) Repeat echo 12-06-10 60% showed normal EF.    Chronic back pain greater than 3 months duration    S/P BACK SURG'S   CSF leak    Diabetes mellitus without complication (HCC)    dx 2013 type 2   Dyslipidemia    Dysphagia    some post op cerv fusion 2/14   Dysrhythmia    Essential hypertension, benign    Family history of adverse reaction to anesthesia    mother gets n/v   Family history of breast cancer 02/06/2022   Family history of liver cancer 02/06/2022   Family history of lung cancer 02/06/2022   Family history of ovarian cancer 02/06/2022   GERD (gastroesophageal reflux disease) AND HIATIAL HERNIA   CONTROLLED W/ NEXIUM   Headache(784.0)    History of chronic bronchitis    Hx of bladder infections    Hyperlipidemia    TAKE CHLOSTEROL MEDICATION,04/23/19   Hypertension    Neuromuscular disorder (HCC)    numbness and tingling   Osteoporosis    Other malaise and fatigue    PONV (postoperative nausea and vomiting)    Shortness of breath    Spinal headache    Spinal stenosis, cervical region    Varicosities    venous   Weakness of both legs DUE TO ARACHNOIDITIS    OCCASIONAL USES CANE    Past Surgical History: Past Surgical History:  Procedure Laterality Date   ABDOMINAL HYSTERECTOMY  1987   W/ BSO   ANTERIOR CERVICAL DECOMP/DISCECTOMY FUSION N/A 08/09/2012   Procedure: ANTERIOR CERVICAL DECOMPRESSION/DISCECTOMY FUSION 1 LEVEL;  Surgeon: Karn Cassis, MD;  Location: [facility];  Service: Neurosurgery;  Laterality: N/A;  Cervical four-five  Anterior cervical decompression/diskectomy/fusion   ANTERIOR FUSION CERVICAL SPINE  2007   C5 -6   APPENDECTOMY     APPLICATION OF INTRAOPERATIVE CT SCAN N/A 05/17/2017   Procedure: APPLICATION OF INTRAOPERATIVE CT SCAN;  Surgeon: Maeola Harman, MD;  Location: [facility] OR;  Service: Neurosurgery;  Laterality: N/A;   BACK SURGERY     x 5   BREAST EXCISIONAL BIOPSY Bilateral    No scar seen    BREAST SURGERY  x 2 biopsies   CARPAL TUNNEL RELEASE  RIGHT - 2001  & DEC 2011 W/ BACK SURG.   CERVICAL DISC SURGERY  2005   C5 - 6   CHOLECYSTECTOMY  1994   COLONOSCOPY WITH PROPOFOL N/A 05/16/2022   Procedure: COLONOSCOPY WITH PROPOFOL;  Surgeon: Dolores Frame, MD;  Location: [facility];  Service: Gastroenterology;  Laterality: N/A;  945 ASA 3   DORSAL COMPARTMENT RELEASE Right 04/07/2013   Procedure: RIGHT WRIST STS RELEASE;  Surgeon: Marlowe Shores, MD;  Location: [facility];  Service: Orthopedics;  Laterality: Right;   ENDOVENOUS ABLATION SAPHENOUS VEIN W/ LASER Right 05/29/2019   endovenous laser ablation right greater saphenous vein by Cari Caraway MD    EXPLORATION OF INCISION FOR CSF LEAK  DEC 2011  X2   POST LAMINECOTMY   FINGER ARTHRODESIS Right 04/07/2013   Procedure: RIGHT INDEX AND RIGHT LONG DISTAL INTERPHALANGEAL JOINT FUSIONS;  Surgeon: Marlowe Shores, MD;  Location: [facility];  Service: Orthopedics;  Laterality: Right;   FINGER ARTHROPLASTY Right 04/07/2013   Procedure: RIGHT THUMB CMC ARTHROPLASTY;  Surgeon: Marlowe Shores, MD;  Location:  [facility];  Service: Orthopedics;  Laterality: Right;   GANGLION CYST EXCISION Right 04/07/2013   Procedure: RIGHT WRIST MASS EXCISION;  Surgeon: Marlowe Shores, MD;  Location: [facility];  Service: Orthopedics;  Laterality: Right;   KNEE ARTHROSCOPY  LEFT X3 (LAST ONE 2005)   KNEE ARTHROSCOPY  05/17/2011   Procedure: ARTHROSCOPY KNEE;  Surgeon: Curlene Labrum III;  Location: [facility];  Service: Orthopedics;  Laterality: Right;  WITH MEDIAL MENISECTOMY AND removal of suprapatella fat lump   LEFT WRIST TENOSYNECTOMY W/ LEFT THUMB JOINT REPAIR  02-24-10   LUMBAR FUSION  11-30-10   L4 - 5   LUMBAR FUSION  2000   L2 - 4   LUMBAR LAMINECTOMY  DEC 2011   L4 - 5   POSTERIOR CERVICAL FUSION/FORAMINOTOMY N/A 05/17/2017   Procedure: Cervical five  to Thorastic one  Posterior cervical fusion with lateral mass screws/revision of prior instrumentation;  Surgeon: Maeola Harman, MD;  Location: [facility];  Service: Neurosurgery;  Laterality: N/A;  C5 to T1 Posterior cervical fusion with lateral mass screws/revision of prior instrumentation   TENDON REPAIR  JAN 2010   LEFT INDEX AND LONG FINGERS   THUMB SURGERY   04/07/2019   RIGHT THUMB   TUBAL LIGATION      Social History: Social History   Tobacco Use   Smoking status: Never   Smokeless tobacco: Never  Vaping Use   Vaping status: Never Used  Substance Use Topics   Alcohol use: No   Drug use: No    Family History: Family History  Problem Relation Age of Onset   Hypertension Mother    Heart disease Mother        Mitral valve replacement.    Diabetes Mother    Liver cancer Father 34       HCC   Diabetes Brother    Lung cancer Brother 66       mets   Melanoma Brother 52       neck and back   Cancer Paternal Uncle        unknown type; dx after 26   Breast cancer Paternal Grandmother        dx 78s   Ovarian cancer Cousin        paternal cousin; d.  30s   Cancer Cousin        GYN; d.  79s   Colon cancer Neg Hx    Colon polyps Neg Hx    Esophageal cancer Neg Hx    Rectal cancer Neg Hx    Stomach cancer Neg Hx     ROS:   Review of Systems  Cardiovascular:  Negative for chest pain, claudication, irregular heartbeat, leg swelling, near-syncope, orthopnea, palpitations, paroxysmal nocturnal dyspnea and syncope.  Respiratory:  Negative for shortness of breath.   Hematologic/Lymphatic: Negative for bleeding problem.    EKGs/Labs/Other Studies Reviewed:   EKG: EKG Interpretation Date/Time:  Monday October 01 2023 10:38:25 EDT Ventricular Rate:  80 PR Interval:  138 QRS Duration:  84 QT Interval:  386 QTC Calculation: 445 R Axis:   20  Text Interpretation: Normal sinus rhythm Normal ECG When compared with ECG of 10-May-2022 09:39, No significant change was found Confirmed by Tessa Lerner [phone]) on 10/01/2023 10:46:09 AM  Echocardiogram: 04/2020: LVEF 55 to 60%, grade 1 diastolic dysfunction, right ventricular size and function normal, no mitral valve prolapse, trivial MR  Carotid Duplex: 03/2021 Right Carotid: Velocities in the right ICA are consistent with a 1-39% stenosis.  Left Carotid: Velocities in the left ICA are consistent with a 1-39% stenosis.   Vertebrals: Bilateral vertebral arteries demonstrate antegrade flow.  Subclavians: Normal flow hemodynamics were seen in bilateral subclavian arteries.   Labs:    Latest Ref Rng & Units 05/10/2022    9:33 AM 08/30/2017   12:52 PM 05/17/2017    6:36 AM  CBC  WBC 4.0 - 10.5 K/uL 5.8  9.9  9.7   Hemoglobin 12.0 - 15.0 g/dL 95.6  21.3  08.6   Hematocrit 36.0 - 46.0 % 38.4  36.5  35.2   Platelets 150 - 400 K/uL 249  289.0  274        Latest Ref Rng & Units 05/10/2022    9:33 AM 05/17/2017    6:36 AM 04/19/2017    1:43 PM  BMP  Glucose 70 - 99 mg/dL 92  578  469   BUN 8 - 23 mg/dL 9  13  15    Creatinine 0.44 - 1.00 mg/dL 6.29  5.28  4.13   Sodium 135 - 145 mmol/L 139  137  137   Potassium 3.5 - 5.1  mmol/L 3.3  3.6  3.5   Chloride 98 - 111 mmol/L 107  102  103   CO2 22 - 32 mmol/L 26  27  25    Calcium 8.9 - 10.3 mg/dL 8.8  8.3  8.9       Latest Ref Rng & Units 05/10/2022    9:33 AM 05/17/2017    6:36 AM 04/19/2017    1:43 PM  CMP  Glucose 70 - 99 mg/dL 92  244  010   BUN 8 - 23 mg/dL 9  13  15    Creatinine 0.44 - 1.00 mg/dL 2.72  5.36  6.44   Sodium 135 - 145 mmol/L 139  137  137   Potassium 3.5 - 5.1 mmol/L 3.3  3.6  3.5   Chloride 98 - 111 mmol/L 107  102  103   CO2 22 - 32 mmol/L 26  27  25    Calcium 8.9 - 10.3 mg/dL 8.8  8.3  8.9     No results found for: "CHOL", "HDL", "LDLCALC", "LDLDIRECT", "TRIG", "CHOLHDL" No results for input(s): "LIPOA" in the last 8760 hours. No components found for: "  NTPROBNP" No results for input(s): "PROBNP" in the last 8760 hours. No results for input(s): "TSH" in the last 8760 hours.  External Labs: Collected: September 10, 2023 [facility] database BUN 10, creatinine 0.65. Sodium 142, potassium 3.4, chloride 101, bicarb 25. Total cholesterol 129, triglycerides 81, HDL 63, LDL calculated 50. A1c 6.5  Physical Exam:    Today's Vitals   10/01/23 1035 10/01/23 1044  BP:  138/84  Pulse: 81   Resp: 16   SpO2: 98%   Weight: 158 lb 12.8 oz (72 kg)   Height: 5\' 5"  (1.651 m)    Body mass index is 26.43 kg/m. Wt Readings from Last 3 Encounters:  10/01/23 158 lb 12.8 oz (72 kg)  09/25/23 163 lb (73.9 kg)  08/31/23 163 lb (73.9 kg)    Physical Exam  Constitutional: No distress.  hemodynamically stable  Neck: No JVD present.  Cardiovascular: Normal rate, regular rhythm, S1 normal and S2 normal. Exam reveals no gallop, no S3 and no S4.  No murmur heard. Pulmonary/Chest: Effort normal and breath sounds normal. No stridor. She has no wheezes. She has no rales.  Musculoskeletal:        General: No edema.     Cervical back: Neck supple.     Comments: Varicose veins bilaterally  Skin: Skin is warm.     Impression & Recommendation(s):   Impression:   ICD-10-CM   1. Cardiomegaly, per imaging  I51.7     2. Preop cardiovascular exam  Z01.810 MYOCARDIAL PERFUSION IMAGING    3. Benign hypertension  I10 EKG 12-Lead    4. Aortic atherosclerosis (HCC)  I70.0     5. Mixed hyperlipidemia  E78.2     6. Non-insulin dependent type 2 diabetes mellitus (HCC)  E11.9        Recommendation(s):  Cardiomegaly, per imaging Incidentally noted on a recent CT of the chest. History of recovered cardiomyopathy. Clinically not in congestive heart failure. Has risk factors for heart disease as mentioned above. Echocardiogram is scheduled for May 2025. Continue current medical therapy for now.  Preop cardiovascular exam Being scheduled for rotator cuff surgery in July 2025 with [facility] under the care of Dr. Hiram Lukes. Currently walks her dog 0.5 miles per day. Has risk factors for heart disease. Echocardiogram pending. Discussed exercise treadmill stress test patient states that she is unable to ambulate on an incline but is willing to undergo exercise nuclear stress test if needed it could be converted to pharmacological stress.  Further recommendations to follow  Benign hypertension Office blood pressures are acceptable. Currently on losartan/hydrochlorothiazide 50/12.5 mg p.o. daily. Currently on Cardizem 120 mg p.o. daily Reemphasized importance of low-salt diet.  Aortic atherosclerosis (HCC) Continue antiplatelets and statin medications. Reemphasized importance of secondary prevention  Mixed hyperlipidemia Currently on simvastatin 10 mg p.o. daily PM. Most recent lipid profile from [facility] database notes her LDL to be 72 mg/dL as of September 1610. Currently being managed by PCP  Non-insulin dependent type 2 diabetes mellitus (HCC) Reemphasized importance of glycemic control. Currently on ARB, statin therapy  Orders Placed:  Orders Placed This Encounter  Procedures   MYOCARDIAL PERFUSION IMAGING    Standing Status:    Future    Expiration Date:   09/30/2024    Patient weight in lbs:   158    Where should this be performed?:   [facility]    Type of stress:   Exercise   EKG 12-Lead     Final Medication  List:   No orders of the defined types were placed in this encounter.   Medications Discontinued During This Encounter  Medication Reason   promethazine (PHENERGAN) 25 MG tablet Patient Preference     Current Outpatient Medications:    albuterol (PROVENTIL) (2.5 MG/3ML) 0.083% nebulizer solution, Take 2.5 mg by nebulization every 4 (four) hours as needed for wheezing or shortness of breath., Disp: , Rfl:    albuterol (VENTOLIN HFA) 108 (90 Base) MCG/ACT inhaler, INHALE 2 PUFFS BY MOUTH EVERY 6 HOURS AS NEEDED FOR WHEEZING OR SHORTNESS OF BREATH, Disp: 8.5 g, Rfl: 4   aspirin EC 81 MG tablet, Take 81 mg by mouth at bedtime. , Disp: , Rfl:    Biotin 1000 MCG tablet, Take 1,000 mcg by mouth daily., Disp: , Rfl:    budesonide-formoterol (SYMBICORT) 160-4.5 MCG/ACT inhaler, Inhale 2 puffs into the lungs 2 (two) times daily., Disp: 1 Inhaler, Rfl: 5   diltiazem (CARDIZEM CD) 120 MG 24 hr capsule, Take 120 mg by mouth daily., Disp: , Rfl:    gabapentin (NEURONTIN) 800 MG tablet, Take 1 tablet (800 mg total) by mouth 3 (three) times daily., Disp: 270 tablet, Rfl: 1   losartan-hydrochlorothiazide (HYZAAR) 50-12.5 MG tablet, Take 1 tablet by mouth daily., Disp: , Rfl:    metFORMIN (GLUCOPHAGE) 500 MG tablet, Take 500 mg by mouth daily with breakfast., Disp: , Rfl:    milk thistle 175 MG tablet, Take by mouth daily., Disp: , Rfl:    montelukast (SINGULAIR) 10 MG tablet, Take 10 mg by mouth daily., Disp: , Rfl:    nortriptyline (PAMELOR) 25 MG capsule, Take 1 capsule (25 mg total) by mouth at bedtime. Three month supply, Disp: 90 capsule, Rfl: 1   omeprazole (PRILOSEC) 40 MG capsule, Take 40 mg by mouth daily., Disp: , Rfl:    oxyCODONE (ROXICODONE) 15 MG immediate release tablet, Take 1  tablet (15 mg total) by mouth 5 (five) times daily as needed for pain., Disp: 150 tablet, Rfl: 0   simvastatin (ZOCOR) 10 MG tablet, Take 10 mg by mouth every evening., Disp: , Rfl:    topiramate (TOPAMAX) 25 MG tablet, Take 1 tablet (25 mg total) by mouth 2 (two) times daily., Disp: 180 tablet, Rfl: 3   TRUE METRIX BLOOD GLUCOSE TEST test strip, daily., Disp: , Rfl:    valACYclovir (VALTREX) 500 MG tablet, Take 500 mg by mouth daily. , Disp: , Rfl:  No current facility-administered medications for this visit.  Facility-Administered Medications Ordered in Other Visits:    levalbuterol (XOPENEX) nebulizer solution 0.63 mg, 0.63 mg, Nebulization, Once, Parrett, Tammy S, NP  Consent:   Informed Consent   Shared Decision Making/Informed Consent The risks [chest pain, shortness of breath, cardiac arrhythmias, dizziness, blood pressure fluctuations, myocardial infarction, stroke/transient ischemic attack, nausea, vomiting, allergic reaction, radiation exposure, metallic taste sensation and life-threatening complications (estimated to be 1 in 10,000)], benefits (risk stratification, diagnosing coronary artery disease, treatment guidance) and alternatives of a nuclear stress test were discussed in detail with [name] [name] and she agrees to proceed.     Disposition:   1 year follow-up sooner if needed.  Her questions and concerns were addressed to her satisfaction. She voices understanding of the recommendations provided during this encounter.    Signed, Tessa Lerner, DO, [facility]  [facility]  [address] [place], [place] [address] 10/01/2023 5:48 PM

## 2023-10-01 NOTE — Patient Instructions (Signed)
 Medication Instructions:  Your physician recommends that you continue on your current medications as directed. Please refer to the Current Medication list given to you today.  *If you need a refill on your cardiac medications before your next appointment, please call your pharmacy*  Lab Work: None ordered today. If you have labs (blood work) drawn today and your tests are completely normal, you will receive your results only by: MyChart Message (if you have MyChart) OR A paper copy in the mail If you have any lab test that is abnormal or we need to change your treatment, we will call you to review the results.  Testing/Procedures: Your physician has requested that you have en exercise stress myoview. For further information please visit [email or web address] Please follow instruction sheet, as given.   **Patient has an echo scheduled for May. Please have these tests completed in the same day if possible due to these being completed for pre-operative clearance.  Follow-Up: At [facility], you and your health needs are our priority.  As part of our continuing mission to provide you with exceptional heart care, we have created designated Provider Care Teams.  These Care Teams include your primary Cardiologist (physician) and Advanced Practice Providers (APPs -  Physician Assistants and Nurse Practitioners) who all work together to provide you with the care you need, when you need it.  Your next appointment:   1 year(s)  The format for your next appointment:   In Person  Provider:   Tessa Lerner, [facility]  Other Instructions Exercise Myoview (Stress Test) Instructions  Please arrive 15 minutes prior to your appointment time for registration and insurance purposes.   The test will take approximately 3 to 4 hours to complete; you may bring reading material.  If someone comes with you to your appointment, they will need to remain in the main lobby due to limited space in the testing area. **If  you are pregnant or breastfeeding, please notify the nuclear lab prior to your appointment**   How to prepare for your Myocardial Perfusion Test: Do not eat or drink 3 hours prior to your test, except you may have water. Do not consume products containing caffeine (regular or decaffeinated) 12 hours prior to your test. (ex: coffee, chocolate, sodas, tea). Do bring a list of your current medications with you.  If not listed below, you may take your medications as normal. Do wear comfortable clothes (no dresses or overalls) and walking shoes, tennis shoes preferred (No heels or open toe shoes are allowed). Do NOT wear cologne, perfume, aftershave, or lotions (deodorant is allowed). If these instructions are not followed, your test will have to be rescheduled.   Please report to [address], [address] for your test.  If you have questions or concerns about your appointment, you can call the Nuclear Lab at [phone].   If you cannot keep your appointment, please provide 24 hours notification to the Nuclear Lab, to avoid a possible $50 charge to your account.      1st Floor: - Lobby - Registration  - Pharmacy  - Lab - Cafe  2nd Floor: - PV Lab - Diagnostic Testing (echo, CT, nuclear med)  3rd Floor: - Vacant  4th Floor: - TCTS (cardiothoracic surgery) - AFib Clinic - Structural Heart Clinic - Vascular Surgery  - Vascular Ultrasound  5th Floor: - HeartCare Cardiology (general and EP) - Clinical Pharmacy for coumadin, hypertension, lipid, weight-loss medications, and med management appointments    Valet parking services will  be available as well.

## 2023-10-19 DIAGNOSIS — E1122 Type 2 diabetes mellitus with diabetic chronic kidney disease: Secondary | ICD-10-CM | POA: Diagnosis not present

## 2023-10-19 DIAGNOSIS — I10 Essential (primary) hypertension: Secondary | ICD-10-CM | POA: Diagnosis not present

## 2023-10-29 NOTE — Progress Notes (Unsigned)
 Subjective:    Patient ID: [name] [name], female    DOB: [birth year], 73 y.o.   MRN: [id number]  HPI: [name] [name] is a 73 y.o. female who returns for follow up appointment for chronic pain and medication refill. She states her pain is located in her neck, right shoulder, mid- lower back radiating into her bilateral lower extremities R>L. She also reports she has a scheduled appointment with Dr Ellery Guthrie on Friday 11/02/2023. She rates her pain 7. Her current exercise regime is walking and performing stretching exercises.  Ms. Esson Morphine  equivalent is 112.50 MME.   Oral Swab was Performed today.      Pain Inventory Average Pain 7 Pain Right Now 7 My pain is sharp, burning, stabbing, tingling, and aching  In the last 24 hours, has pain interfered with the following? General activity 7 Relation with others 7 Enjoyment of life 7 What TIME of day is your pain at its worst? morning , daytime, evening, and night Sleep (in general) Fair  Pain is worse with: walking, bending, sitting, inactivity, standing, and some activites Pain improves with: rest, heat/ice, pacing activities, and medication Relief from Meds: 7  Family History  Problem Relation Age of Onset   Hypertension Mother    Heart disease Mother        Mitral valve replacement.    Diabetes Mother    Liver cancer Father 65       HCC   Diabetes Brother    Lung cancer Brother 72       mets   Melanoma Brother 29       neck and back   Cancer Paternal Uncle        unknown type; dx after 96   Breast cancer Paternal Grandmother        dx 52s   Ovarian cancer Cousin        paternal cousin; d. 30s   Cancer Cousin        GYN; d. 60s   Colon cancer Neg Hx    Colon polyps Neg Hx    Esophageal cancer Neg Hx    Rectal cancer Neg Hx    Stomach cancer Neg Hx    Social History   Socioeconomic History   Marital status: Legally Separated    Spouse name: Not on file   Number of children: Not on file   Years of education:  Not on file   Highest education level: Not on file  Occupational History   Occupation: disable    Employer: DISABLED  Tobacco Use   Smoking status: Never   Smokeless tobacco: Never  Vaping Use   Vaping status: Never Used  Substance and Sexual Activity   Alcohol  use: No   Drug use: No   Sexual activity: Not on file    Comment: second hand smoke  Other Topics Concern   Not on file  Social History Narrative   Has one child lives at home with her.    Social Drivers of Corporate investment banker Strain: Not on file  Food Insecurity: Not on file  Transportation Needs: Not on file  Physical Activity: Not on file  Stress: Not on file  Social Connections: Not on file   Past Surgical History:  Procedure Laterality Date   ABDOMINAL HYSTERECTOMY  1987   W/ BSO   ANTERIOR CERVICAL DECOMP/DISCECTOMY FUSION N/A 08/09/2012   Procedure: ANTERIOR CERVICAL DECOMPRESSION/DISCECTOMY FUSION 1 LEVEL;  Surgeon: Adelbert Adler, MD;  Location: [facility]  NEURO ORS;  Service: Neurosurgery;  Laterality: N/A;  Cervical four-five  Anterior cervical decompression/diskectomy/fusion   ANTERIOR FUSION CERVICAL SPINE  2007   C5 -6   APPENDECTOMY     APPLICATION OF INTRAOPERATIVE CT SCAN N/A 05/17/2017   Procedure: APPLICATION OF INTRAOPERATIVE CT SCAN;  Surgeon: Manya Sells, MD;  Location: [facility] OR;  Service: Neurosurgery;  Laterality: N/A;   BACK SURGERY     x 5   BREAST EXCISIONAL BIOPSY Bilateral    No scar seen    BREAST SURGERY     x 2 biopsies   CARPAL TUNNEL RELEASE  RIGHT - 2001  & DEC 2011 W/ BACK SURG.   CERVICAL DISC SURGERY  2005   C5 - 6   CHOLECYSTECTOMY  1994   COLONOSCOPY WITH PROPOFOL  N/A 05/16/2022   Procedure: COLONOSCOPY WITH PROPOFOL ;  Surgeon: Urban Garden, MD;  Location: [facility];  Service: Gastroenterology;  Laterality: N/A;  945 ASA 3   DORSAL COMPARTMENT RELEASE Right 04/07/2013   Procedure: RIGHT WRIST STS RELEASE;  Surgeon: Hedy Living, MD;  Location:  [facility];  Service: Orthopedics;  Laterality: Right;   ENDOVENOUS ABLATION SAPHENOUS VEIN W/ LASER Right 05/29/2019   endovenous laser ablation right greater saphenous vein by Kirtland Perfect MD    EXPLORATION OF INCISION FOR CSF LEAK  DEC 2011  X2   POST LAMINECOTMY   FINGER ARTHRODESIS Right 04/07/2013   Procedure: RIGHT INDEX AND RIGHT LONG DISTAL INTERPHALANGEAL JOINT FUSIONS;  Surgeon: Hedy Living, MD;  Location: [facility];  Service: Orthopedics;  Laterality: Right;   FINGER ARTHROPLASTY Right 04/07/2013   Procedure: RIGHT THUMB CMC ARTHROPLASTY;  Surgeon: Hedy Living, MD;  Location: [facility];  Service: Orthopedics;  Laterality: Right;   GANGLION CYST EXCISION Right 04/07/2013   Procedure: RIGHT WRIST MASS EXCISION;  Surgeon: Hedy Living, MD;  Location: [facility];  Service: Orthopedics;  Laterality: Right;   KNEE ARTHROSCOPY  LEFT X3 (LAST ONE 2005)   KNEE ARTHROSCOPY  05/17/2011   Procedure: ARTHROSCOPY KNEE;  Surgeon: Bonner Butler III;  Location: [facility];  Service: Orthopedics;  Laterality: Right;  WITH MEDIAL MENISECTOMY AND removal of suprapatella fat lump   LEFT WRIST TENOSYNECTOMY W/ LEFT THUMB JOINT REPAIR  02-24-10   LUMBAR FUSION  11-30-10   L4 - 5   LUMBAR FUSION  2000   L2 - 4   LUMBAR LAMINECTOMY  DEC 2011   L4 - 5   POSTERIOR CERVICAL FUSION/FORAMINOTOMY N/A 05/17/2017   Procedure: Cervical five  to Thorastic one  Posterior cervical fusion with lateral mass screws/revision of prior instrumentation;  Surgeon: Manya Sells, MD;  Location: [facility] OR;  Service: Neurosurgery;  Laterality: N/A;  C5 to T1 Posterior cervical fusion with lateral mass screws/revision of prior instrumentation   TENDON REPAIR  JAN 2010   LEFT INDEX AND LONG FINGERS   THUMB SURGERY   04/07/2019   RIGHT THUMB   TUBAL LIGATION     Past Surgical History:  Procedure Laterality Date   ABDOMINAL  HYSTERECTOMY  1987   W/ BSO   ANTERIOR CERVICAL DECOMP/DISCECTOMY FUSION N/A 08/09/2012   Procedure: ANTERIOR CERVICAL DECOMPRESSION/DISCECTOMY FUSION 1 LEVEL;  Surgeon: Adelbert Adler, MD;  Location: [facility] NEURO ORS;  Service: Neurosurgery;  Laterality: N/A;  Cervical four-five  Anterior cervical decompression/diskectomy/fusion   ANTERIOR FUSION CERVICAL SPINE  2007   C5 -6   APPENDECTOMY  APPLICATION OF INTRAOPERATIVE CT SCAN N/A 05/17/2017   Procedure: APPLICATION OF INTRAOPERATIVE CT SCAN;  Surgeon: Manya Sells, MD;  Location: [facility] OR;  Service: Neurosurgery;  Laterality: N/A;   BACK SURGERY     x 5   BREAST EXCISIONAL BIOPSY Bilateral    No scar seen    BREAST SURGERY     x 2 biopsies   CARPAL TUNNEL RELEASE  RIGHT - 2001  & DEC 2011 W/ BACK SURG.   CERVICAL DISC SURGERY  2005   C5 - 6   CHOLECYSTECTOMY  1994   COLONOSCOPY WITH PROPOFOL  N/A 05/16/2022   Procedure: COLONOSCOPY WITH PROPOFOL ;  Surgeon: Urban Garden, MD;  [facility];  Service: Gastroenterology;  Laterality: N/A;  945 ASA 3   DORSAL COMPARTMENT RELEASE Right 04/07/2013   Procedure: RIGHT WRIST STS RELEASE;  Surgeon: Hedy Living, MD;  Location: [facility];  Service: Orthopedics;  Laterality: Right;   ENDOVENOUS ABLATION SAPHENOUS VEIN W/ LASER Right 05/29/2019   endovenous laser ablation right greater saphenous vein by Kirtland Perfect MD    EXPLORATION OF INCISION FOR CSF LEAK  DEC 2011  X2   POST LAMINECOTMY   FINGER ARTHRODESIS Right 04/07/2013   Procedure: RIGHT INDEX AND RIGHT LONG DISTAL INTERPHALANGEAL JOINT FUSIONS;  Surgeon: Hedy Living, MD;  Location: [facility];  Service: Orthopedics;  Laterality: Right;   FINGER ARTHROPLASTY Right 04/07/2013   Procedure: RIGHT THUMB CMC ARTHROPLASTY;  Surgeon: Hedy Living, MD;  Location: [facility];  Service: Orthopedics;  Laterality: Right;   GANGLION CYST EXCISION Right 04/07/2013    Procedure: RIGHT WRIST MASS EXCISION;  Surgeon: Hedy Living, MD;  Location: [facility];  Service: Orthopedics;  Laterality: Right;   KNEE ARTHROSCOPY  LEFT X3 (LAST ONE 2005)   KNEE ARTHROSCOPY  05/17/2011   Procedure: ARTHROSCOPY KNEE;  Surgeon: Bonner Butler III;  Location: [facility];  Service: Orthopedics;  Laterality: Right;  WITH MEDIAL MENISECTOMY AND removal of suprapatella fat lump   LEFT WRIST TENOSYNECTOMY W/ LEFT THUMB JOINT REPAIR  02-24-10   LUMBAR FUSION  11-30-10   L4 - 5   LUMBAR FUSION  2000   L2 - 4   LUMBAR LAMINECTOMY  DEC 2011   L4 - 5   POSTERIOR CERVICAL FUSION/FORAMINOTOMY N/A 05/17/2017   Procedure: Cervical five  to Thorastic one  Posterior cervical fusion with lateral mass screws/revision of prior instrumentation;  Surgeon: Manya Sells, MD;  Location: [facility] OR;  Service: Neurosurgery;  Laterality: N/A;  C5 to T1 Posterior cervical fusion with lateral mass screws/revision of prior instrumentation   TENDON REPAIR  JAN 2010   LEFT INDEX AND LONG FINGERS   THUMB SURGERY   04/07/2019   RIGHT THUMB   TUBAL LIGATION     Past Medical History:  Diagnosis Date   Acute sinusitis, unspecified    Allergy     Anemia    "quite a few times"   Anxiety state, unspecified    Arachnoiditis BILATERAL LEGS   DUE TO MULTIPLE BACK SURG.'S   Arthritis    Asthma    last flare up was 03/2017 lasted over a month   Blood transfusion    Cancer (HCC)    skin cancers (in scalp)   Cardiomyopathy HX --06/2010   EF was 25% during acute illness (PHELONEPHRITIS) Repeat echo 12-06-10 60% showed normal EF.    Chronic back pain greater than 3 months duration  S/P BACK SURG'S   CSF leak    Diabetes mellitus without complication (HCC)    dx 2013 type 2   Dyslipidemia    Dysphagia    some post op cerv fusion 2/14   Dysrhythmia    Essential hypertension, benign    Family history of adverse reaction to anesthesia    mother gets n/v   Family  history of breast cancer 02/06/2022   Family history of liver cancer 02/06/2022   Family history of lung cancer 02/06/2022   Family history of ovarian cancer 02/06/2022   GERD (gastroesophageal reflux disease) AND HIATIAL HERNIA   CONTROLLED W/ NEXIUM   Headache(784.0)    History of chronic bronchitis    Hx of bladder infections    Hyperlipidemia    TAKE CHLOSTEROL MEDICATION,04/23/19   Hypertension    Neuromuscular disorder (HCC)    numbness and tingling   Osteoporosis    Other malaise and fatigue    PONV (postoperative nausea and vomiting)    Shortness of breath    Spinal headache    Spinal stenosis, cervical region    Varicosities    venous   Weakness of both legs DUE TO ARACHNOIDITIS   OCCASIONAL USES CANE   BP 110/61 (BP Location: Left Arm, Patient Position: Sitting, Cuff Size: Large)   Pulse 94   Ht 5\' 5"  (1.651 m)   Wt 162 lb 12.8 oz (73.8 kg)   SpO2 98%   BMI 27.09 kg/m   Opioid Risk Score:   Fall Risk Score:  `1  Depression screen PHQ 2/9     10/30/2023    1:38 PM 09/25/2023    1:54 PM 08/31/2023    1:44 PM 07/05/2023    2:51 PM 05/04/2023    1:27 PM 04/06/2023    1:30 PM 03/06/2023    1:23 PM  Depression screen PHQ 2/9  Decreased Interest 0 0 1 0 1 0 0  Down, Depressed, Hopeless 0 0 1 0 1 0 0  PHQ - 2 Score 0 0 2 0 2 0 0    Review of Systems  Musculoskeletal:  Positive for back pain, joint swelling and myalgias.       Neck pain  that radiates down right arm, thoracic back pain, bilateral leg pain  All other systems reviewed and are negative.      Objective:   Physical Exam Vitals and nursing note reviewed.  Constitutional:      Appearance: Normal appearance.  Neck:     Comments: Cervical Paraspinal Tenderness: C-2-C-4 Cardiovascular:     Rate and Rhythm: Normal rate and regular rhythm.     Pulses: Normal pulses.     Heart sounds: Normal heart sounds.  Pulmonary:     Effort: Pulmonary effort is normal.     Breath sounds: Normal breath sounds.   Musculoskeletal:     Comments: Normal Muscle Bulk and Muscle Testing Reveals:  Upper Extremities: Right: Decreased ROM 45 Degrees and Muscle Strength 4/5  Let Upper extremity: Full ROM and Muscle Strength 5/5  Thoracic Hypersensitivity  Lumbar Paraspinal Tenderness: L-3-L-5 Mainly Right side  Lower Extremities: Full ROM and Muscle Strength 5/5 Arises rom Table with ease  Narrow Based  Gait     Skin:    General: Skin is warm and dry.  Neurological:     Mental Status: She is alert and oriented to person, place, and time.  Psychiatric:        Mood and Affect: Mood normal.  Behavior: Behavior normal.         Assessment & Plan:  1.  On 05/17/2017 :C5C6C7 T1 Posterior Cervical Fusion with lateral mass fixation with [facility] Imaging, revision of prior instrumentation. By Dr. Nigel Bart.  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 Continue current medication regimen with  Gabapentin  800 mg TID. Refilled: Oxycodone  15mg  one tablet 5 times a day  as needed for pain # 150 tablets We will continue the opioid monitoring program, this consists of regular clinic visits, examinations, urine drug screen, pill counts as well as use of [place]  Controlled Substance Reporting system. 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 Continue to Monitor. 08/03/2023 3. Anxiety/depression: PCP Following. Continue to monitor. 10/30/2023. 4. Muscle Spasms: Continue current medication regime with Flexeril . Continue to Monitor. 10/30/2023 5. Cervicalgia/ Cervical Radiculitis: Dr Nigel Bart Following. ,Continue current medication regime with Gabapentin :  S/P  C5C6C7 T1 Posterior Cervical Fusion with lateral mass fixation with AIRO Imaging, revision of prior instrumentation by Dr. Nigel Bart on 05/17/2017.  10/30/2023 7. Bilateral Thoracic Back Pain: Continue HEP as Tolerated and Continue current medication regimen. Continue to Monitor. 10/30/2023  8. Left lower extremity DVT/ Phlebitis:  S/P endovenous laser ablation of left great saphenous vein on 05/09/2018 by Dr. Shaunna Delaware:  Vascular  Following. 10/30/2023. 9. Insomnia: Continue Pamelor . Continue to Monitor. 10/30/2023. 10. Acute Right Shoulder Pain / Right Shoulder Tendonitis: [facility] Following.Continue to monitor.  10/30/2023.   F/U in 1 month

## 2023-10-30 ENCOUNTER — Encounter: Payer: Self-pay | Admitting: Registered Nurse

## 2023-10-30 ENCOUNTER — Encounter: Attending: Physical Medicine & Rehabilitation | Admitting: Registered Nurse

## 2023-10-30 VITALS — BP 110/61 | HR 94 | Ht 65.0 in | Wt 162.8 lb

## 2023-10-30 DIAGNOSIS — M546 Pain in thoracic spine: Secondary | ICD-10-CM | POA: Diagnosis not present

## 2023-10-30 DIAGNOSIS — Z79891 Long term (current) use of opiate analgesic: Secondary | ICD-10-CM | POA: Diagnosis not present

## 2023-10-30 DIAGNOSIS — G894 Chronic pain syndrome: Secondary | ICD-10-CM | POA: Diagnosis not present

## 2023-10-30 DIAGNOSIS — M5416 Radiculopathy, lumbar region: Secondary | ICD-10-CM | POA: Diagnosis not present

## 2023-10-30 DIAGNOSIS — M25511 Pain in right shoulder: Secondary | ICD-10-CM | POA: Diagnosis not present

## 2023-10-30 DIAGNOSIS — G8929 Other chronic pain: Secondary | ICD-10-CM | POA: Diagnosis not present

## 2023-10-30 DIAGNOSIS — Z5181 Encounter for therapeutic drug level monitoring: Secondary | ICD-10-CM | POA: Insufficient documentation

## 2023-10-30 DIAGNOSIS — M542 Cervicalgia: Secondary | ICD-10-CM | POA: Insufficient documentation

## 2023-10-30 DIAGNOSIS — G039 Meningitis, unspecified: Secondary | ICD-10-CM | POA: Insufficient documentation

## 2023-10-30 MED ORDER — OXYCODONE HCL 15 MG PO TABS: 15.0000 mg | ORAL_TABLET | Freq: Every day | ORAL | 0 refills | Status: DC | PRN

## 2023-10-30 MED ORDER — GABAPENTIN 800 MG PO TABS: 800.0000 mg | ORAL_TABLET | Freq: Three times a day (TID) | ORAL | 1 refills | Status: DC

## 2023-10-31 ENCOUNTER — Other Ambulatory Visit: Payer: Self-pay | Admitting: Cardiology

## 2023-10-31 ENCOUNTER — Telehealth (HOSPITAL_COMMUNITY): Payer: Self-pay | Admitting: *Deleted

## 2023-10-31 DIAGNOSIS — Z01810 Encounter for preprocedural cardiovascular examination: Secondary | ICD-10-CM

## 2023-10-31 NOTE — Telephone Encounter (Signed)
 Patient given detailed instructions per Myocardial Perfusion Study Information Sheet for the test on 11/05/2023 at 8:00. Patient notified to arrive 15 minutes early and that it is imperative to arrive on time for appointment to keep from having the test rescheduled. If you need to cancel or reschedule your appointment, please call the office within 24 hours of your appointment. . Patient verbalized understanding.Anne Barrios

## 2023-11-02 DIAGNOSIS — M5136 Other intervertebral disc degeneration, lumbar region with discogenic back pain only: Secondary | ICD-10-CM | POA: Diagnosis not present

## 2023-11-03 LAB — TOXASSURE SELECT,+ANTIDEPR,UR

## 2023-11-05 ENCOUNTER — Other Ambulatory Visit: Payer: Self-pay

## 2023-11-05 ENCOUNTER — Ambulatory Visit (HOSPITAL_COMMUNITY)
Admission: RE | Admit: 2023-11-05 | Discharge: 2023-11-05 | Disposition: A | Discharge status: Home / Self Care | Source: Ambulatory Visit | Attending: Internal Medicine | Admitting: Internal Medicine

## 2023-11-05 ENCOUNTER — Telehealth: Payer: Self-pay

## 2023-11-05 ENCOUNTER — Ambulatory Visit (HOSPITAL_COMMUNITY)

## 2023-11-05 DIAGNOSIS — Z809 Family history of malignant neoplasm, unspecified: Secondary | ICD-10-CM | POA: Diagnosis not present

## 2023-11-05 DIAGNOSIS — I340 Nonrheumatic mitral (valve) insufficiency: Secondary | ICD-10-CM

## 2023-11-05 DIAGNOSIS — R21 Rash and other nonspecific skin eruption: Secondary | ICD-10-CM | POA: Insufficient documentation

## 2023-11-05 DIAGNOSIS — Z01810 Encounter for preprocedural cardiovascular examination: Secondary | ICD-10-CM

## 2023-11-05 DIAGNOSIS — I517 Cardiomegaly: Secondary | ICD-10-CM | POA: Insufficient documentation

## 2023-11-05 DIAGNOSIS — J4540 Moderate persistent asthma, uncomplicated: Secondary | ICD-10-CM | POA: Diagnosis not present

## 2023-11-05 DIAGNOSIS — I700 Atherosclerosis of aorta: Secondary | ICD-10-CM | POA: Diagnosis not present

## 2023-11-05 DIAGNOSIS — E119 Type 2 diabetes mellitus without complications: Secondary | ICD-10-CM

## 2023-11-05 LAB — MYOCARDIAL PERFUSION IMAGING
LV dias vol: 106 mL (ref 46–106)
LV sys vol: 40 mL
Nuc Stress EF: 62 %
Peak HR: 123 {beats}/min
Rest HR: 74 {beats}/min
Rest Nuclear Isotope Dose: 8.5 mCi
SDS: 0
SRS: 0
SSS: 0
ST Depression (mm): 0 mm
Stress Nuclear Isotope Dose: 28.2 mCi
TID: 0.98

## 2023-11-05 LAB — ECHOCARDIOGRAM COMPLETE
Area-P 1/2: 3.85 cm2
S' Lateral: 4.1 cm

## 2023-11-05 MED ORDER — TECHNETIUM TC 99M TETROFOSMIN IV KIT
28.2000 | PACK | Freq: Once | INTRAVENOUS | Status: AC | PRN
  Administered 2023-11-05: 28.2 via INTRAVENOUS

## 2023-11-05 MED ORDER — REGADENOSON 0.4 MG/5ML IV SOLN
0.4000 mg | Freq: Once | INTRAVENOUS | Status: AC
  Administered 2023-11-05: 0.4 mg via INTRAVENOUS

## 2023-11-05 MED ORDER — REGADENOSON 0.4 MG/5ML IV SOLN
INTRAVENOUS | Status: AC
  Filled 2023-11-05: qty 5

## 2023-11-05 MED ORDER — TECHNETIUM TC 99M TETROFOSMIN IV KIT
8.5000 | PACK | Freq: Once | INTRAVENOUS | Status: AC | PRN
  Administered 2023-11-05: 8.5 via INTRAVENOUS

## 2023-11-05 NOTE — Addendum Note (Signed)
 Addended by: Dorthey Gave on: 11/05/2023 09:20 AM   Modules accepted: Orders

## 2023-11-11 ENCOUNTER — Ambulatory Visit: Payer: Self-pay | Admitting: Cardiology

## 2023-11-12 ENCOUNTER — Ambulatory Visit: Payer: Self-pay | Admitting: Cardiology

## 2023-11-12 ENCOUNTER — Encounter: Payer: Self-pay | Admitting: Cardiology

## 2023-11-12 NOTE — Addendum Note (Signed)
 Addended by: Augustine Led on: 11/12/2023 12:57 PM   Modules accepted: Orders

## 2023-11-19 NOTE — Telephone Encounter (Signed)
 Called her today to go over her results. However reached her voicemail and left a message.  Will send a preop letter for shoulder surgery.  Raylynn Hersh Corning, DO, FACC

## 2023-11-22 DIAGNOSIS — R200 Anesthesia of skin: Secondary | ICD-10-CM | POA: Diagnosis not present

## 2023-11-22 DIAGNOSIS — R202 Paresthesia of skin: Secondary | ICD-10-CM | POA: Diagnosis not present

## 2023-11-22 DIAGNOSIS — M48061 Spinal stenosis, lumbar region without neurogenic claudication: Secondary | ICD-10-CM | POA: Diagnosis not present

## 2023-11-22 DIAGNOSIS — M5136 Other intervertebral disc degeneration, lumbar region with discogenic back pain only: Secondary | ICD-10-CM | POA: Diagnosis not present

## 2023-11-22 DIAGNOSIS — M51379 Other intervertebral disc degeneration, lumbosacral region without mention of lumbar back pain or lower extremity pain: Secondary | ICD-10-CM | POA: Diagnosis not present

## 2023-11-24 DIAGNOSIS — I10 Essential (primary) hypertension: Secondary | ICD-10-CM | POA: Diagnosis not present

## 2023-11-24 DIAGNOSIS — E1122 Type 2 diabetes mellitus with diabetic chronic kidney disease: Secondary | ICD-10-CM | POA: Diagnosis not present

## 2023-11-29 ENCOUNTER — Encounter: Attending: Physical Medicine & Rehabilitation | Admitting: Registered Nurse

## 2023-11-29 VITALS — BP 128/84 | HR 86 | Ht 65.0 in | Wt 162.0 lb

## 2023-11-29 DIAGNOSIS — G894 Chronic pain syndrome: Secondary | ICD-10-CM | POA: Insufficient documentation

## 2023-11-29 DIAGNOSIS — Z79891 Long term (current) use of opiate analgesic: Secondary | ICD-10-CM | POA: Diagnosis not present

## 2023-11-29 DIAGNOSIS — M546 Pain in thoracic spine: Secondary | ICD-10-CM | POA: Diagnosis not present

## 2023-11-29 DIAGNOSIS — Z5181 Encounter for therapeutic drug level monitoring: Secondary | ICD-10-CM | POA: Insufficient documentation

## 2023-11-29 DIAGNOSIS — G039 Meningitis, unspecified: Secondary | ICD-10-CM | POA: Insufficient documentation

## 2023-11-29 DIAGNOSIS — M5412 Radiculopathy, cervical region: Secondary | ICD-10-CM | POA: Diagnosis not present

## 2023-11-29 DIAGNOSIS — M25511 Pain in right shoulder: Secondary | ICD-10-CM | POA: Diagnosis not present

## 2023-11-29 DIAGNOSIS — M5416 Radiculopathy, lumbar region: Secondary | ICD-10-CM | POA: Diagnosis not present

## 2023-11-29 DIAGNOSIS — G8929 Other chronic pain: Secondary | ICD-10-CM | POA: Diagnosis not present

## 2023-11-29 DIAGNOSIS — M542 Cervicalgia: Secondary | ICD-10-CM | POA: Diagnosis not present

## 2023-11-29 MED ORDER — OXYCODONE HCL 15 MG PO TABS: 15.0000 mg | ORAL_TABLET | Freq: Every day | ORAL | 0 refills | Status: DC | PRN

## 2023-11-29 NOTE — Progress Notes (Signed)
 Subjective:    Patient ID: [name] [name], female    DOB: 1951-05-23, 73 y.o.   MRN: [id number]  HPI: [name] [name] is a 73 y.o. female who returns for follow up appointment for chronic pain and medication refill. She states her pain is located in her neck radiating into her right shoulder, upper-lower back radiating into her bilateral lower extremities. She rates  her pain 9. Her current exercise regime is walking.  She also reports she picked up edging, she has resolving ecchymosis on her left side.  Ms. Ebarb Morphine  equivalent is 112.50 MME.   Last UDS was Performed on 10/30/2023, it was consistent.      Pain Inventory Average Pain 7 Pain Right Now 9 My pain is sharp, burning, stabbing, tingling, and aching  In the last 24 hours, has pain interfered with the following? General activity 8 Relation with others 8 Enjoyment of life 7 What TIME of day is your pain at its worst? morning , daytime, evening, and night Sleep (in general) Fair  Pain is worse with: walking, bending, sitting, inactivity, standing, and some activites Pain improves with: heat/ice, pacing activities, and medication Relief from Meds: 7  Family History  Problem Relation Age of Onset   Hypertension Mother    Heart disease Mother        Mitral valve replacement.    Diabetes Mother    Liver cancer Father 32       HCC   Diabetes Brother    Lung cancer Brother 46       mets   Melanoma Brother 79       neck and back   Cancer Paternal Uncle        unknown type; dx after 21   Breast cancer Paternal Grandmother        dx 76s   Ovarian cancer Cousin        paternal cousin; d. 30s   Cancer Cousin        GYN; d. 65s   Colon cancer Neg Hx    Colon polyps Neg Hx    Esophageal cancer Neg Hx    Rectal cancer Neg Hx    Stomach cancer Neg Hx    Social History   Socioeconomic History   Marital status: Legally Separated    Spouse name: Not on file   Number of children: Not on file   Years of education:  Not on file   Highest education level: Not on file  Occupational History   Occupation: disable    Employer: DISABLED  Tobacco Use   Smoking status: Never   Smokeless tobacco: Never  Vaping Use   Vaping status: Never Used  Substance and Sexual Activity   Alcohol  use: No   Drug use: No   Sexual activity: Not on file    Comment: second hand smoke  Other Topics Concern   Not on file  Social History Narrative   Has one child lives at home with her.    Social Drivers of Corporate investment banker Strain: Not on file  Food Insecurity: Not on file  Transportation Needs: Not on file  Physical Activity: Not on file  Stress: Not on file  Social Connections: Not on file   Past Surgical History:  Procedure Laterality Date   ABDOMINAL HYSTERECTOMY  1987   W/ BSO   ANTERIOR CERVICAL DECOMP/DISCECTOMY FUSION N/A 08/09/2012   Procedure: ANTERIOR CERVICAL DECOMPRESSION/DISCECTOMY FUSION 1 LEVEL;  Surgeon: Catalina CHRISTELLA Stains, MD;  Location: [facility];  Service: Neurosurgery;  Laterality: N/A;  Cervical four-five  Anterior cervical decompression/diskectomy/fusion   ANTERIOR FUSION CERVICAL SPINE  2007   C5 -6   APPENDECTOMY     APPLICATION OF INTRAOPERATIVE CT SCAN N/A 05/17/2017   Procedure: APPLICATION OF INTRAOPERATIVE CT SCAN;  Surgeon: Unice Pac, MD;  Location: [facility] OR;  Service: Neurosurgery;  Laterality: N/A;   BACK SURGERY     x 5   BREAST EXCISIONAL BIOPSY Bilateral    No scar seen    BREAST SURGERY     x 2 biopsies   CARPAL TUNNEL RELEASE  RIGHT - 2001  & DEC 2011 W/ BACK SURG.   CERVICAL DISC SURGERY  2005   C5 - 6   CHOLECYSTECTOMY  1994   COLONOSCOPY WITH PROPOFOL  N/A 05/16/2022   Procedure: COLONOSCOPY WITH PROPOFOL ;  Surgeon: Eartha Angelia Sieving, MD;  [facility]: [facility];  Service: Gastroenterology;  Laterality: N/A;  945 ASA 3   DORSAL COMPARTMENT RELEASE Right 04/07/2013   Procedure: RIGHT WRIST STS RELEASE;  Surgeon: Donnice DELENA Robinsons, MD;  Location:  [facility];  Service: Orthopedics;  Laterality: Right;   ENDOVENOUS ABLATION SAPHENOUS VEIN W/ LASER Right 05/29/2019   endovenous laser ablation right greater saphenous vein by Medford Blade MD    EXPLORATION OF INCISION FOR CSF LEAK  DEC 2011  X2   POST LAMINECOTMY   FINGER ARTHRODESIS Right 04/07/2013   Procedure: RIGHT INDEX AND RIGHT LONG DISTAL INTERPHALANGEAL JOINT FUSIONS;  Surgeon: Donnice DELENA Robinsons, MD;  Location: [facility];  Service: Orthopedics;  Laterality: Right;   FINGER ARTHROPLASTY Right 04/07/2013   Procedure: RIGHT THUMB CMC ARTHROPLASTY;  Surgeon: Donnice DELENA Robinsons, MD;  Location: [facility];  Service: Orthopedics;  Laterality: Right;   GANGLION CYST EXCISION Right 04/07/2013   Procedure: RIGHT WRIST MASS EXCISION;  Surgeon: Donnice DELENA Robinsons, MD;  Location: [facility];  Service: Orthopedics;  Laterality: Right;   KNEE ARTHROSCOPY  LEFT X3 (LAST ONE 2005)   KNEE ARTHROSCOPY  05/17/2011   Procedure: ARTHROSCOPY KNEE;  Surgeon: Norleen LITTIE Hoard III;  Location: [facility];  Service: Orthopedics;  Laterality: Right;  WITH MEDIAL MENISECTOMY AND removal of suprapatella fat lump   LEFT WRIST TENOSYNECTOMY W/ LEFT THUMB JOINT REPAIR  02-24-10   LUMBAR FUSION  11-30-10   L4 - 5   LUMBAR FUSION  2000   L2 - 4   LUMBAR LAMINECTOMY  DEC 2011   L4 - 5   POSTERIOR CERVICAL FUSION/FORAMINOTOMY N/A 05/17/2017   Procedure: Cervical five  to Thorastic one  Posterior cervical fusion with lateral mass screws/revision of prior instrumentation;  Surgeon: Unice Pac, MD;  Location: [facility] OR;  Service: Neurosurgery;  Laterality: N/A;  C5 to T1 Posterior cervical fusion with lateral mass screws/revision of prior instrumentation   TENDON REPAIR  JAN 2010   LEFT INDEX AND LONG FINGERS   THUMB SURGERY   04/07/2019   RIGHT THUMB   TUBAL LIGATION     Past Surgical History:  Procedure Laterality Date   ABDOMINAL  HYSTERECTOMY  1987   W/ BSO   ANTERIOR CERVICAL DECOMP/DISCECTOMY FUSION N/A 08/09/2012   Procedure: ANTERIOR CERVICAL DECOMPRESSION/DISCECTOMY FUSION 1 LEVEL;  Surgeon: Catalina CHRISTELLA Stains, MD;  Location: [facility];  Service: Neurosurgery;  Laterality: N/A;  Cervical four-five  Anterior cervical decompression/diskectomy/fusion   ANTERIOR FUSION CERVICAL SPINE  2007   C5 -6   APPENDECTOMY  APPLICATION OF INTRAOPERATIVE CT SCAN N/A 05/17/2017   Procedure: APPLICATION OF INTRAOPERATIVE CT SCAN;  Surgeon: Unice Pac, MD;  Location: [facility] OR;  Service: Neurosurgery;  Laterality: N/A;   BACK SURGERY     x 5   BREAST EXCISIONAL BIOPSY Bilateral    No scar seen    BREAST SURGERY     x 2 biopsies   CARPAL TUNNEL RELEASE  RIGHT - 2001  & DEC 2011 W/ BACK SURG.   CERVICAL DISC SURGERY  2005   C5 - 6   CHOLECYSTECTOMY  1994   COLONOSCOPY WITH PROPOFOL  N/A 05/16/2022   Procedure: COLONOSCOPY WITH PROPOFOL ;  Surgeon: Eartha Angelia Sieving, MD;  [facility];  Service: Gastroenterology;  Laterality: N/A;  945 ASA 3   DORSAL COMPARTMENT RELEASE Right 04/07/2013   Procedure: RIGHT WRIST STS RELEASE;  Surgeon: Donnice DELENA Robinsons, MD;  Location: [facility];  Service: Orthopedics;  Laterality: Right;   ENDOVENOUS ABLATION SAPHENOUS VEIN W/ LASER Right 05/29/2019   endovenous laser ablation right greater saphenous vein by Medford Blade MD    EXPLORATION OF INCISION FOR CSF LEAK  DEC 2011  X2   POST LAMINECOTMY   FINGER ARTHRODESIS Right 04/07/2013   Procedure: RIGHT INDEX AND RIGHT LONG DISTAL INTERPHALANGEAL JOINT FUSIONS;  Surgeon: Donnice DELENA Robinsons, MD;  Location: [facility];  Service: Orthopedics;  Laterality: Right;   FINGER ARTHROPLASTY Right 04/07/2013   Procedure: RIGHT THUMB CMC ARTHROPLASTY;  Surgeon: Donnice DELENA Robinsons, MD;  Location: [facility];  Service: Orthopedics;  Laterality: Right;   GANGLION CYST EXCISION Right 04/07/2013    Procedure: RIGHT WRIST MASS EXCISION;  Surgeon: Donnice DELENA Robinsons, MD;  Location: [facility];  Service: Orthopedics;  Laterality: Right;   KNEE ARTHROSCOPY  LEFT X3 (LAST ONE 2005)   KNEE ARTHROSCOPY  05/17/2011   Procedure: ARTHROSCOPY KNEE;  Surgeon: Norleen LITTIE Hoard III;  Location: [facility];  Service: Orthopedics;  Laterality: Right;  WITH MEDIAL MENISECTOMY AND removal of suprapatella fat lump   LEFT WRIST TENOSYNECTOMY W/ LEFT THUMB JOINT REPAIR  02-24-10   LUMBAR FUSION  11-30-10   L4 - 5   LUMBAR FUSION  2000   L2 - 4   LUMBAR LAMINECTOMY  DEC 2011   L4 - 5   POSTERIOR CERVICAL FUSION/FORAMINOTOMY N/A 05/17/2017   Procedure: Cervical five  to Thorastic one  Posterior cervical fusion with lateral mass screws/revision of prior instrumentation;  Surgeon: Unice Pac, MD;  Location: [facility] OR;  Service: Neurosurgery;  Laterality: N/A;  C5 to T1 Posterior cervical fusion with lateral mass screws/revision of prior instrumentation   TENDON REPAIR  JAN 2010   LEFT INDEX AND LONG FINGERS   THUMB SURGERY   04/07/2019   RIGHT THUMB   TUBAL LIGATION     Past Medical History:  Diagnosis Date   Acute sinusitis, unspecified    Allergy     Anemia    quite a few times   Anxiety state, unspecified    Arachnoiditis BILATERAL LEGS   DUE TO MULTIPLE BACK SURG.'S   Arthritis    Asthma    last flare up was 03/2017 lasted over a month   Blood transfusion    Cancer (HCC)    skin cancers (in scalp)   Cardiomyopathy HX --06/2010   EF was 25% during acute illness (PHELONEPHRITIS) Repeat echo 12-06-10 60% showed normal EF.    Chronic back pain greater than 3 months duration  S/P BACK SURG'S   CSF leak    Diabetes mellitus without complication (HCC)    dx 2013 type 2   Dyslipidemia    Dysphagia    some post op cerv fusion 2/14   Dysrhythmia    Essential hypertension, benign    Family history of adverse reaction to anesthesia    mother gets n/v   Family  history of breast cancer 02/06/2022   Family history of liver cancer 02/06/2022   Family history of lung cancer 02/06/2022   Family history of ovarian cancer 02/06/2022   GERD (gastroesophageal reflux disease) AND HIATIAL HERNIA   CONTROLLED W/ NEXIUM   Headache(784.0)    History of chronic bronchitis    Hx of bladder infections    Hyperlipidemia    TAKE CHLOSTEROL MEDICATION,04/23/19   Hypertension    Neuromuscular disorder (HCC)    numbness and tingling   Osteoporosis    Other malaise and fatigue    PONV (postoperative nausea and vomiting)    Shortness of breath    Spinal headache    Spinal stenosis, cervical region    Varicosities    venous   Weakness of both legs DUE TO ARACHNOIDITIS   OCCASIONAL USES CANE   BP 128/84   Pulse 86   Ht 5' 5 (1.651 m)   Wt 162 lb (73.5 kg)   SpO2 97%   BMI 26.96 kg/m   Opioid Risk Score:   Fall Risk Score:  `1  Depression screen PHQ 2/9     11/29/2023    1:30 PM 10/30/2023    1:38 PM 09/25/2023    1:54 PM 08/31/2023    1:44 PM 07/05/2023    2:51 PM 05/04/2023    1:27 PM 04/06/2023    1:30 PM  Depression screen PHQ 2/9  Decreased Interest 0 0 0 1 0 1 0  Down, Depressed, Hopeless 0 0 0 1 0 1 0  PHQ - 2 Score 0 0 0 2 0 2 0    Review of Systems  Musculoskeletal:  Positive for arthralgias, back pain, myalgias and neck pain.       Right neck and arm bil legs  All other systems reviewed and are negative.      Objective:   Physical Exam Vitals and nursing note reviewed.  Constitutional:      Appearance: Normal appearance.  Neck:     Comments: Cervical Paraspinal Tenderness: C-5-C-6 Mainly Right Side  Cardiovascular:     Rate and Rhythm: Normal rate and regular rhythm.     Pulses: Normal pulses.     Heart sounds: Normal heart sounds.  Pulmonary:     Effort: Pulmonary effort is normal.     Breath sounds: Normal breath sounds.   Musculoskeletal:     Comments: Normal Muscle Bulk and Muscle Testing Reveals:  Upper Extremities:  Right: Decreased ROM 45 Degrees  and Muscle Strength 5/5 Right AC Joint Tenderness Left Upper Extremity: Full ROM and Muscle Strength 5/5  Thoracic and Lumbar Hypersensitivity Lower Extremities: Full ROM and Muscle Strength 5/5 Arises from Table slowly Narrow Based  Gait      Skin:    General: Skin is warm and dry.   Neurological:     Mental Status: She is alert and oriented to person, place, and time.   Psychiatric:        Mood and Affect: Mood normal.        Behavior: Behavior normal.         Assessment &  Plan:  1.  On 05/17/2017 :C5C6C7 T1 Posterior Cervical Fusion with lateral mass fixation with AIRO Imaging, revision of prior instrumentation. By Dr. Unice.  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 Continue current medication regimen with  Gabapentin  800 mg TID. Refilled: Oxycodone  15mg  one tablet 5 times a day  as needed for pain # 150 tablets We will continue the opioid monitoring program, this consists of regular clinic visits, examinations, urine drug screen, pill counts as well as use of [place]  Controlled Substance Reporting system. 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 Continue to Monitor. 11/29/2023 3. Anxiety/depression: PCP Following. Continue to monitor. 11/29/2023. 4. Muscle Spasms: Continue current medication regime with Flexeril . Continue to Monitor. 11/29/2023 5. Cervicalgia/ Cervical Radiculitis: Dr Unice Following. ,Continue current medication regime with Gabapentin :  S/P  C5C6C7 T1 Posterior Cervical Fusion with lateral mass fixation with AIRO Imaging, revision of prior instrumentation by Dr. Unice on 05/17/2017. 11/29/2023 7. Bilateral Thoracic Back Pain: Continue HEP as Tolerated and Continue current medication regimen. Continue  to Monitor. 11/29/2023  8. Left lower extremity DVT/ Phlebitis:  S/P endovenous laser ablation of left great saphenous vein on 05/09/2018 by Dr. Eliza:  Vascular  Following. 11/29/2023. 9. Insomnia: Continue Pamelor . Continue to Monitor. 11/29/2023. 10. Acute Right Shoulder Pain / Right Shoulder Tendonitis: [facility] Following.Continue to monitor.  11/29/2023.   F/U in 1 month

## 2023-12-09 ENCOUNTER — Encounter: Payer: Self-pay | Admitting: Registered Nurse

## 2023-12-11 DIAGNOSIS — M818 Other osteoporosis without current pathological fracture: Secondary | ICD-10-CM | POA: Diagnosis not present

## 2023-12-11 DIAGNOSIS — E7849 Other hyperlipidemia: Secondary | ICD-10-CM | POA: Diagnosis not present

## 2023-12-11 DIAGNOSIS — N182 Chronic kidney disease, stage 2 (mild): Secondary | ICD-10-CM | POA: Diagnosis not present

## 2023-12-11 DIAGNOSIS — I10 Essential (primary) hypertension: Secondary | ICD-10-CM | POA: Diagnosis not present

## 2023-12-11 DIAGNOSIS — E1122 Type 2 diabetes mellitus with diabetic chronic kidney disease: Secondary | ICD-10-CM | POA: Diagnosis not present

## 2023-12-12 ENCOUNTER — Other Ambulatory Visit (HOSPITAL_COMMUNITY): Payer: Self-pay | Admitting: Internal Medicine

## 2023-12-12 DIAGNOSIS — K760 Fatty (change of) liver, not elsewhere classified: Secondary | ICD-10-CM

## 2023-12-14 DIAGNOSIS — Z6827 Body mass index (BMI) 27.0-27.9, adult: Secondary | ICD-10-CM | POA: Diagnosis not present

## 2023-12-14 DIAGNOSIS — M5136 Other intervertebral disc degeneration, lumbar region with discogenic back pain only: Secondary | ICD-10-CM | POA: Diagnosis not present

## 2023-12-19 ENCOUNTER — Telehealth: Payer: Self-pay | Admitting: Nurse Practitioner

## 2023-12-19 NOTE — Telephone Encounter (Signed)
 I have openings in [place] that I can see her in before then. Thanks

## 2023-12-19 NOTE — Telephone Encounter (Signed)
 Called the pt and there was no answer- LMTCB

## 2023-12-19 NOTE — Telephone Encounter (Signed)
 Fax received from Dr. Selinda Gosling with [facility] to perform a right shoulder scope RCR, biceps tenodesis on patient.  Patient needs surgery clearance. Surgery is 02/01/24. Patient was seen on 08/29/23. Office protocol is a risk assessment can be sent to surgeon if patient has been seen in 60 days or less.   MR has no availability sooner than her surgery date  Pt has seen Katie in the past  Izetta, are you ok with overbooking to see her?

## 2023-12-25 ENCOUNTER — Ambulatory Visit (HOSPITAL_COMMUNITY)

## 2023-12-26 NOTE — Telephone Encounter (Signed)
 Called the pt and there was no answer- LMTCB

## 2023-12-31 ENCOUNTER — Encounter: Attending: Physical Medicine & Rehabilitation | Admitting: Registered Nurse

## 2023-12-31 ENCOUNTER — Encounter: Payer: Self-pay | Admitting: Registered Nurse

## 2023-12-31 VITALS — BP 118/78 | HR 88 | Ht 65.0 in | Wt 163.0 lb

## 2023-12-31 DIAGNOSIS — G039 Meningitis, unspecified: Secondary | ICD-10-CM | POA: Diagnosis present

## 2023-12-31 DIAGNOSIS — G8929 Other chronic pain: Secondary | ICD-10-CM | POA: Insufficient documentation

## 2023-12-31 DIAGNOSIS — M542 Cervicalgia: Secondary | ICD-10-CM | POA: Insufficient documentation

## 2023-12-31 DIAGNOSIS — M546 Pain in thoracic spine: Secondary | ICD-10-CM | POA: Insufficient documentation

## 2023-12-31 DIAGNOSIS — M5412 Radiculopathy, cervical region: Secondary | ICD-10-CM | POA: Diagnosis not present

## 2023-12-31 DIAGNOSIS — Z5181 Encounter for therapeutic drug level monitoring: Secondary | ICD-10-CM | POA: Insufficient documentation

## 2023-12-31 DIAGNOSIS — M5416 Radiculopathy, lumbar region: Secondary | ICD-10-CM | POA: Diagnosis present

## 2023-12-31 DIAGNOSIS — Z79891 Long term (current) use of opiate analgesic: Secondary | ICD-10-CM | POA: Diagnosis not present

## 2023-12-31 DIAGNOSIS — G894 Chronic pain syndrome: Secondary | ICD-10-CM | POA: Insufficient documentation

## 2023-12-31 MED ORDER — OXYCODONE HCL 15 MG PO TABS: 15.0000 mg | ORAL_TABLET | Freq: Every day | ORAL | 0 refills | Status: DC | PRN

## 2023-12-31 NOTE — Progress Notes (Signed)
 Subjective:    Patient ID: [name] [name], female    DOB: April 24, 1951, 73 y.o.   MRN: [id number]  HPI: [name] [name] is a 73 y.o. female who returns for follow up appointment for chronic pain and medication refill. She states her pain is located in her neck radiating into her right shoulder and right arm. She also reports mid- lower back radiating into her right lower extremity. She rates her pain 9. Her current exercise regime is walking, she reports at this time she is not performing stretching exercises.   [name] [name] is scheduled for Right Shoulder Surgery, with Dr Sharl ARTHROSCOPY, SHOULDER, WITH ROTATOR CUFF REPAIR Right Choice  TENODESIS, BICEPS      [name] [name] Morphine  equivalent is 112.50 MME.   Last UDS was Performed on 10/30/2023, it was consistent.      Pain Inventory Average Pain 7 Pain Right Now 9 My pain is sharp, burning, stabbing, and aching  In the last 24 hours, has pain interfered with the following? General activity 8 Relation with others 7 Enjoyment of life 7 What TIME of day is your pain at its worst? morning , daytime, evening, and night Sleep (in general) Fair  Pain is worse with: walking, bending, sitting, inactivity, standing, and some activites Pain improves with: rest, pacing activities, and medication Relief from Meds: 7  Family History  Problem Relation Age of Onset   Hypertension Mother    Heart disease Mother        Mitral valve replacement.    Diabetes Mother    Liver cancer Father 44       HCC   Diabetes Brother    Lung cancer Brother 53       mets   Melanoma Brother 63       neck and back   Cancer Paternal Uncle        unknown type; dx after 26   Breast cancer Paternal Grandmother        dx 85s   Ovarian cancer Cousin        paternal cousin; d. 30s   Cancer Cousin        GYN; d. 17s   Colon cancer Neg Hx    Colon polyps Neg Hx    Esophageal cancer Neg Hx    Rectal cancer Neg Hx    Stomach cancer Neg Hx    Social History    Socioeconomic History   Marital status: Legally Separated    Spouse name: Not on file   Number of children: Not on file   Years of education: Not on file   Highest education level: Not on file  Occupational History   Occupation: disable    Employer: DISABLED  Tobacco Use   Smoking status: Never   Smokeless tobacco: Never  Vaping Use   Vaping status: Never Used  Substance and Sexual Activity   Alcohol  use: No   Drug use: No   Sexual activity: Not on file    Comment: second hand smoke  Other Topics Concern   Not on file  Social History Narrative   Has one child lives at home with her.    Social Drivers of Corporate investment banker Strain: Not on file  Food Insecurity: Not on file  Transportation Needs: Not on file  Physical Activity: Not on file  Stress: Not on file  Social Connections: Not on file   Past Surgical History:  Procedure Laterality Date   ABDOMINAL HYSTERECTOMY  1987   W/ BSO   ANTERIOR CERVICAL DECOMP/DISCECTOMY FUSION N/A 08/09/2012   Procedure: ANTERIOR CERVICAL DECOMPRESSION/DISCECTOMY FUSION 1 LEVEL;  Surgeon: [name] CHRISTELLA Stains, MD;  Location: [facility];  Service: Neurosurgery;  Laterality: N/A;  Cervical four-five  Anterior cervical decompression/diskectomy/fusion   ANTERIOR FUSION CERVICAL SPINE  2007   C5 -6   APPENDECTOMY     APPLICATION OF INTRAOPERATIVE CT SCAN N/A 05/17/2017   Procedure: APPLICATION OF INTRAOPERATIVE CT SCAN;  Surgeon: [name] Pac, MD;  Location: [facility];  Service: Neurosurgery;  Laterality: N/A;   BACK SURGERY     x 5   BREAST EXCISIONAL BIOPSY Bilateral    No scar seen    BREAST SURGERY     x 2 biopsies   CARPAL TUNNEL RELEASE  RIGHT - 2001  & DEC 2011 W/ BACK SURG.   CERVICAL DISC SURGERY  2005   C5 - 6   CHOLECYSTECTOMY  1994   COLONOSCOPY WITH PROPOFOL  N/A 05/16/2022   Procedure: COLONOSCOPY WITH PROPOFOL ;  Surgeon: [name] Angelia Sieving, MD;  [facility];  Service: Gastroenterology;   Laterality: N/A;  945 ASA 3   DORSAL COMPARTMENT RELEASE Right 04/07/2013   Procedure: RIGHT WRIST STS RELEASE;  Surgeon: [name] DELENA Robinsons, MD;  Location: [facility];  Service: Orthopedics;  Laterality: Right;   ENDOVENOUS ABLATION SAPHENOUS VEIN W/ LASER Right 05/29/2019   endovenous laser ablation right greater saphenous vein by [name] Blade MD    EXPLORATION OF INCISION FOR CSF LEAK  DEC 2011  X2   POST LAMINECOTMY   FINGER ARTHRODESIS Right 04/07/2013   Procedure: RIGHT INDEX AND RIGHT LONG DISTAL INTERPHALANGEAL JOINT FUSIONS;  Surgeon: [name] DELENA Robinsons, MD;  Location: [facility];  Service: Orthopedics;  Laterality: Right;   FINGER ARTHROPLASTY Right 04/07/2013   Procedure: RIGHT THUMB CMC ARTHROPLASTY;  Surgeon: [name] DELENA Robinsons, MD;  Location: [facility];  Service: Orthopedics;  Laterality: Right;   GANGLION CYST EXCISION Right 04/07/2013   Procedure: RIGHT WRIST MASS EXCISION;  Surgeon: [name] DELENA Robinsons, MD;  Location: [facility];  Service: Orthopedics;  Laterality: Right;   KNEE ARTHROSCOPY  LEFT X3 (LAST ONE 2005)   KNEE ARTHROSCOPY  05/17/2011   Procedure: ARTHROSCOPY KNEE;  Surgeon: [name] [name];  Location: [facility];  Service: Orthopedics;  Laterality: Right;  WITH MEDIAL MENISECTOMY AND removal of suprapatella fat lump   LEFT WRIST TENOSYNECTOMY W/ LEFT THUMB JOINT REPAIR  02-24-10   LUMBAR FUSION  11-30-10   L4 - 5   LUMBAR FUSION  2000   L2 - 4   LUMBAR LAMINECTOMY  DEC 2011   L4 - 5   POSTERIOR CERVICAL FUSION/FORAMINOTOMY N/A 05/17/2017   Procedure: Cervical five  to Thorastic one  Posterior cervical fusion with lateral mass screws/revision of prior instrumentation;  Surgeon: [name] Pac, MD;  Location: [facility] OR;  Service: Neurosurgery;  Laterality: N/A;  C5 to T1 Posterior cervical fusion with lateral mass screws/revision of prior instrumentation   TENDON REPAIR  JAN 2010   LEFT  INDEX AND LONG FINGERS   THUMB SURGERY   04/07/2019   RIGHT THUMB   TUBAL LIGATION     Past Surgical History:  Procedure Laterality Date   ABDOMINAL HYSTERECTOMY  1987   W/ BSO   ANTERIOR CERVICAL DECOMP/DISCECTOMY FUSION N/A 08/09/2012   Procedure: ANTERIOR CERVICAL DECOMPRESSION/DISCECTOMY FUSION 1 LEVEL;  Surgeon: [name] CHRISTELLA Stains, MD;  Location: [facility];  Service: Neurosurgery;  Laterality: N/A;  Cervical four-five  Anterior cervical decompression/diskectomy/fusion   ANTERIOR FUSION CERVICAL SPINE  2007   C5 -6   APPENDECTOMY     APPLICATION OF INTRAOPERATIVE CT SCAN N/A 05/17/2017   Procedure: APPLICATION OF INTRAOPERATIVE CT SCAN;  Surgeon: [name] Pac, MD;  Location[facility];  Service: Neurosurgery;  Laterality: N/A;   BACK SURGERY     x 5   BREAST EXCISIONAL BIOPSY Bilateral    No scar seen    BREAST SURGERY     x 2 biopsies   CARPAL TUNNEL RELEASE  RIGHT - 2001  & DEC 2011 W/ BACK SURG.   CERVICAL DISC SURGERY  2005   C5 - 6   CHOLECYSTECTOMY  1994   COLONOSCOPY WITH PROPOFOL  N/A 05/16/2022   Procedure: COLONOSCOPY WITH PROPOFOL ;  Surgeon: [name] Angelia Sieving, MD;  [facility];  Service: Gastroenterology;  Laterality: N/A;  945 ASA 3   DORSAL COMPARTMENT RELEASE Right 04/07/2013   Procedure: RIGHT WRIST STS RELEASE;  Surgeon: [name] DELENA Robinsons, MD;  Location: [facility];  Service: Orthopedics;  Laterality: Right;   ENDOVENOUS ABLATION SAPHENOUS VEIN W/ LASER Right 05/29/2019   endovenous laser ablation right greater saphenous vein by [name] Blade MD    EXPLORATION OF INCISION FOR CSF LEAK  DEC 2011  X2   POST LAMINECOTMY   FINGER ARTHRODESIS Right 04/07/2013   Procedure: RIGHT INDEX AND RIGHT LONG DISTAL INTERPHALANGEAL JOINT FUSIONS;  Surgeon: [name] DELENA Robinsons, MD;  Location: [facility];  Service: Orthopedics;  Laterality: Right;   FINGER ARTHROPLASTY Right 04/07/2013   Procedure: RIGHT THUMB CMC ARTHROPLASTY;   Surgeon: [name] DELENA Robinsons, MD;  Location: [facility];  Service: Orthopedics;  Laterality: Right;   GANGLION CYST EXCISION Right 04/07/2013   Procedure: RIGHT WRIST MASS EXCISION;  Surgeon: [name] DELENA Robinsons, MD;  Location: [facility];  Service: Orthopedics;  Laterality: Right;   KNEE ARTHROSCOPY  LEFT X3 (LAST ONE 2005)   KNEE ARTHROSCOPY  05/17/2011   Procedure: ARTHROSCOPY KNEE;  Surgeon: [name] [name];  Location: [facility];  Service: Orthopedics;  Laterality: Right;  WITH MEDIAL MENISECTOMY AND removal of suprapatella fat lump   LEFT WRIST TENOSYNECTOMY W/ LEFT THUMB JOINT REPAIR  02-24-10   LUMBAR FUSION  11-30-10   L4 - 5   LUMBAR FUSION  2000   L2 - 4   LUMBAR LAMINECTOMY  DEC 2011   L4 - 5   POSTERIOR CERVICAL FUSION/FORAMINOTOMY N/A 05/17/2017   Procedure: Cervical five  to Thorastic one  Posterior cervical fusion with lateral mass screws/revision of prior instrumentation;  Surgeon: [name] Pac, MD;  Location: [facility] OR;  Service: Neurosurgery;  Laterality: N/A;  C5 to T1 Posterior cervical fusion with lateral mass screws/revision of prior instrumentation   TENDON REPAIR  JAN 2010   LEFT INDEX AND LONG FINGERS   THUMB SURGERY   04/07/2019   RIGHT THUMB   TUBAL LIGATION     Past Medical History:  Diagnosis Date   Acute sinusitis, unspecified    Allergy     Anemia    quite a few times   Anxiety state, unspecified    Arachnoiditis BILATERAL LEGS   DUE TO MULTIPLE BACK SURG.'S   Arthritis    Asthma    last flare up was 03/2017 lasted over a month   Blood transfusion    Cancer (HCC)    skin cancers (in scalp)  Cardiomyopathy HX --06/2010   EF was 25% during acute illness (PHELONEPHRITIS) Repeat echo 12-06-10 60% showed normal EF.    Chronic back pain greater than 3 months duration    S/P BACK SURG'S   CSF leak    Diabetes mellitus without complication (HCC)    dx 2013 type 2   Dyslipidemia    Dysphagia    some post  op cerv fusion 2/14   Dysrhythmia    Essential hypertension, benign    Family history of adverse reaction to anesthesia    mother gets n/v   Family history of breast cancer 02/06/2022   Family history of liver cancer 02/06/2022   Family history of lung cancer 02/06/2022   Family history of ovarian cancer 02/06/2022   GERD (gastroesophageal reflux disease) AND HIATIAL HERNIA   CONTROLLED W/ NEXIUM   Headache(784.0)    History of chronic bronchitis    Hx of bladder infections    Hyperlipidemia    TAKE CHLOSTEROL MEDICATION,04/23/19   Hypertension    Neuromuscular disorder (HCC)    numbness and tingling   Osteoporosis    Other malaise and fatigue    PONV (postoperative nausea and vomiting)    Shortness of breath    Spinal headache    Spinal stenosis, cervical region    Varicosities    venous   Weakness of both legs DUE TO ARACHNOIDITIS   OCCASIONAL USES CANE   BP 118/78 (BP Location: Left Arm, Patient Position: Sitting, Cuff Size: Large)   Pulse 88   Ht 5' 5 (1.651 m)   Wt 163 lb (73.9 kg)   SpO2 98%   BMI 27.12 kg/m   Opioid Risk Score:   Fall Risk Score:  `1  Depression screen PHQ 2/9     11/29/2023    1:30 PM 10/30/2023    1:38 PM 09/25/2023    1:54 PM 08/31/2023    1:44 PM 07/05/2023    2:51 PM 05/04/2023    1:27 PM 04/06/2023    1:30 PM  Depression screen PHQ 2/9  Decreased Interest 0 0 0 1 0 1 0  Down, Depressed, Hopeless 0 0 0 1 0 1 0  PHQ - 2 Score 0 0 0 2 0 2 0    Review of Systems  Musculoskeletal:  Positive for back pain.       Bilateral leg pain Right shoulder pain   All other systems reviewed and are negative.      Objective:   Physical Exam Vitals and nursing note reviewed.  Constitutional:      Appearance: Normal appearance.  Cardiovascular:     Rate and Rhythm: Normal rate and regular rhythm.     Pulses: Normal pulses.     Heart sounds: Normal heart sounds.  Pulmonary:     Effort: Pulmonary effort is normal.     Breath sounds: Normal  breath sounds.  Musculoskeletal:     Comments: Normal Muscle Bulk and Muscle Testing Reveals:  Upper Extremities: Right: Decreased ROM 30 Degrees and Muscle Strength 5/5 Right AC Joint Tenderness Left Upper Extremity: Full ROM and Muscle Strength 5/5 Thoracic Paraspinal Tenderness: T-1-T-4 Mainly Right Side   Lumbar Paraspinal Tenderness: L-3-L-5 Lower Extremities: Full ROM and Muscle Strength 5/5 Arises from Table with ease  Narrow Based  Gait     Skin:    General: Skin is warm and dry.  Neurological:     Mental Status: She is alert and oriented to person, place, and time.  Psychiatric:  Mood and Affect: Mood normal.        Behavior: Behavior normal.          Assessment & Plan:  1.  On 05/17/2017 :C5C6C7 T1 Posterior Cervical Fusion with lateral mass fixation with AIRO Imaging, revision of prior instrumentation. By Dr. Unice.  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 Continue current medication regimen with  Gabapentin  800 mg TID. Refilled: Oxycodone  15mg  one tablet 5 times a day  as needed for pain # 150 tablets We will continue the opioid monitoring program, this consists of regular clinic visits, examinations, urine drug screen, pill counts as well as use of [place]  Controlled Substance Reporting system. 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 Continue to Monitor. 12/31/2023 3. Anxiety/depression: PCP Following. Continue to monitor. 12/31/2023. 4. Muscle Spasms: Continue current medication regime with Flexeril . Continue to Monitor. 12/31/2023 5. Cervicalgia/ Cervical Radiculitis: Dr [name] Following. ,Continue current medication regime with Gabapentin :  S/P  C5C6C7 T1 Posterior Cervical Fusion with lateral mass fixation with AIRO Imaging,  revision of prior instrumentation by Dr. Unice on 05/17/2017. 12/31/2023 7. Bilateral Thoracic Back Pain: Continue HEP as Tolerated and Continue current medication regimen. Continue to Monitor. 12/31/2023  8. Left lower extremity DVT/ Phlebitis:  S/P endovenous laser ablation of left great saphenous vein on 05/09/2018 by Dr. Eliza:  Vascular  Following. 12/31/2023. 9. Insomnia: Continue Pamelor . Continue to Monitor. 12/31/2023. 10.Right Shoulder Pain / Right Shoulder Tendonitis: [facility] Following.Scheduled for Surgery: on 02/01/2024  ARTHROSCOPY, SHOULDER, WITH ROTATOR CUFF REPAIR Right Choice  TENODESIS, BICEPS    Continue to monitor.  12/31/2023.   F/U in 1 month

## 2024-01-02 ENCOUNTER — Ambulatory Visit (HOSPITAL_COMMUNITY)
Admission: RE | Admit: 2024-01-02 | Discharge: 2024-01-02 | Disposition: A | Discharge status: Home / Self Care | Source: Ambulatory Visit | Attending: Internal Medicine | Admitting: Internal Medicine

## 2024-01-02 DIAGNOSIS — K7689 Other specified diseases of liver: Secondary | ICD-10-CM | POA: Diagnosis not present

## 2024-01-02 DIAGNOSIS — K760 Fatty (change of) liver, not elsewhere classified: Secondary | ICD-10-CM | POA: Diagnosis not present

## 2024-01-02 DIAGNOSIS — Z9049 Acquired absence of other specified parts of digestive tract: Secondary | ICD-10-CM | POA: Diagnosis not present

## 2024-01-16 DIAGNOSIS — E1122 Type 2 diabetes mellitus with diabetic chronic kidney disease: Secondary | ICD-10-CM | POA: Diagnosis not present

## 2024-01-16 DIAGNOSIS — I10 Essential (primary) hypertension: Secondary | ICD-10-CM | POA: Diagnosis not present

## 2024-01-20 DIAGNOSIS — Z886 Allergy status to analgesic agent status: Secondary | ICD-10-CM | POA: Diagnosis not present

## 2024-01-20 DIAGNOSIS — R0781 Pleurodynia: Secondary | ICD-10-CM | POA: Diagnosis not present

## 2024-01-20 DIAGNOSIS — X500XXA Overexertion from strenuous movement or load, initial encounter: Secondary | ICD-10-CM | POA: Diagnosis not present

## 2024-01-20 DIAGNOSIS — Z888 Allergy status to other drugs, medicaments and biological substances status: Secondary | ICD-10-CM | POA: Diagnosis not present

## 2024-01-20 DIAGNOSIS — Z885 Allergy status to narcotic agent status: Secondary | ICD-10-CM | POA: Diagnosis not present

## 2024-01-20 DIAGNOSIS — Z91040 Latex allergy status: Secondary | ICD-10-CM | POA: Diagnosis not present

## 2024-01-20 DIAGNOSIS — S2020XA Contusion of thorax, unspecified, initial encounter: Secondary | ICD-10-CM | POA: Diagnosis not present

## 2024-01-25 ENCOUNTER — Telehealth: Payer: Self-pay

## 2024-01-25 NOTE — Telephone Encounter (Signed)
   Pre-operative Risk Assessment    Patient Name: [name] [name]  DOB: 02/19/1951 MRN: [id number]   Date of last office visit: 10/01/23 MADONNA LARGE, DO Date of next office visit: NONE   Request for Surgical Clearance    Procedure:  RIGHT SHOULDER SCOPE WITH RCR, BICEPS TENODESIS AND SAD  Date of Surgery:  Clearance 02/01/24                                Surgeon:  DR SELINDA GOSLING Surgeon's Group or Practice Name:  [facility] Phone number:  [phone] Fax number:  [phone]  ATTN: KERRI MAZE   Type of Clearance Requested:   - Medical  - Pharmacy:  Hold Aspirin      Type of Anesthesia:  Not Indicated   Additional requests/questions:    SignedLucie DELENA Ku   01/25/2024, 10:36 AM

## 2024-01-25 NOTE — Telephone Encounter (Signed)
 Dr. Michele,  You recently saw this patient in clinic and ordered a stress test.  Given the results of the stress test and echo performed in May, how do you advise proceeding with patient's aspirin  and Plavix in regards to her upcoming orthopedic surgery?  Please route your response to CV DIV PREOP POOL.  Thank you for your time. Miriam Shams, FNP-C    01/25/2024, 12:09 PM [facility] [address] 5th Floor Office [phone] Fax [phone]

## 2024-01-25 NOTE — Telephone Encounter (Signed)
     Primary Cardiologist: Madonna Large, DO  Chart reviewed as part of pre-operative protocol coverage. Given past medical history and time since last visit, based on ACC/AHA guidelines, [name] [name] would be at acceptable risk for the planned procedure without further cardiovascular testing.   According to the Revised Cardiac Risk Index (RCRI),   Perioperative Risk of Major Cardiac Event is (%): 0.9     Patient's ASA can be held for 5-7 days prior to her procedure, please resume as soon as hemostasis is achieved.  I will route this recommendation to the requesting party via Epic fax function and remove from pre-op pool.  Please call with questions. Miriam Shams, FNP-C  01/25/2024, 1:09 PM [facility] [address] 5[address] Office [phone] Fax [phone]

## 2024-01-25 NOTE — Telephone Encounter (Signed)
 From cardiovascular standpoint can hold aspirin  as recommended by her provider prior to the upcoming surgery. Based on her medication list I do not see Plavix.  Dr. Matej Sappenfield

## 2024-01-28 ENCOUNTER — Encounter: Attending: Physical Medicine & Rehabilitation | Admitting: Registered Nurse

## 2024-01-28 ENCOUNTER — Encounter: Payer: Self-pay | Admitting: Registered Nurse

## 2024-01-28 ENCOUNTER — Encounter (HOSPITAL_BASED_OUTPATIENT_CLINIC_OR_DEPARTMENT_OTHER)
Admission: RE | Admit: 2024-01-28 | Discharge: 2024-01-28 | Disposition: A | Discharge status: Home / Self Care | Source: Ambulatory Visit | Attending: Orthopedic Surgery | Admitting: Orthopedic Surgery

## 2024-01-28 VITALS — BP 131/83 | HR 81 | Ht 65.0 in | Wt 162.0 lb

## 2024-01-28 DIAGNOSIS — M5416 Radiculopathy, lumbar region: Secondary | ICD-10-CM | POA: Diagnosis not present

## 2024-01-28 DIAGNOSIS — M5412 Radiculopathy, cervical region: Secondary | ICD-10-CM | POA: Diagnosis not present

## 2024-01-28 DIAGNOSIS — Z79891 Long term (current) use of opiate analgesic: Secondary | ICD-10-CM | POA: Diagnosis not present

## 2024-01-28 DIAGNOSIS — Z01818 Encounter for other preprocedural examination: Secondary | ICD-10-CM | POA: Diagnosis not present

## 2024-01-28 DIAGNOSIS — Z5181 Encounter for therapeutic drug level monitoring: Secondary | ICD-10-CM | POA: Insufficient documentation

## 2024-01-28 DIAGNOSIS — G8929 Other chronic pain: Secondary | ICD-10-CM | POA: Insufficient documentation

## 2024-01-28 DIAGNOSIS — G894 Chronic pain syndrome: Secondary | ICD-10-CM | POA: Insufficient documentation

## 2024-01-28 DIAGNOSIS — M546 Pain in thoracic spine: Secondary | ICD-10-CM | POA: Insufficient documentation

## 2024-01-28 DIAGNOSIS — M25511 Pain in right shoulder: Secondary | ICD-10-CM | POA: Diagnosis not present

## 2024-01-28 DIAGNOSIS — M542 Cervicalgia: Secondary | ICD-10-CM | POA: Diagnosis not present

## 2024-01-28 LAB — BASIC METABOLIC PANEL WITH GFR
Anion gap: 11 (ref 5–15)
BUN: 9 mg/dL (ref 8–23)
CO2: 26 mmol/L (ref 22–32)
Calcium: 9 mg/dL (ref 8.9–10.3)
Chloride: 104 mmol/L (ref 98–111)
Creatinine, Ser: 0.6 mg/dL (ref 0.44–1.00)
GFR, Estimated: >60 mL/min (ref >60)
Glucose, Bld: 123 mg/dL — ABNORMAL HIGH (ref 70–99)
Potassium: 3.6 mmol/L (ref 3.5–5.1)
Sodium: 141 mmol/L (ref 135–145)

## 2024-01-28 MED ORDER — OXYCODONE HCL 15 MG PO TABS: 15.0000 mg | ORAL_TABLET | Freq: Every day | ORAL | 0 refills | Status: DC | PRN

## 2024-01-28 NOTE — Progress Notes (Signed)

## 2024-01-28 NOTE — Progress Notes (Signed)
 Subjective:    Patient ID: [name] [name], female    DOB: 1950-06-25, 73 y.o.   MRN: [id number]  HPI: [name] [name] is a 73 y.o. female who returns for follow up appointment for chronic pain and medication refill. She states her pain is located in her neck radiating into her right shoulder and mid- lower back radiating into her bilateral lower extremities. She rates her pain 8. Her current exercise regime is walking and performing stretching exercises.  [name] Morphine  equivalent is 112.50 MME.   Last UDS was Performed on 05/13/ 2025, it was consistent.      Pain Inventory Average Pain 7 Pain Right Now 8 My pain is constant, sharp, burning, stabbing, tingling, and aching  In the last 24 hours, has pain interfered with the following? General activity 7 Relation with others 7 Enjoyment of life 7 What TIME of day is your pain at its worst? morning , daytime, evening, and night Sleep (in general) Fair  Pain is worse with: walking, bending, sitting, standing, and some activites Pain improves with: pacing activities, medication, and heat Relief from Meds: 7  Family History  Problem Relation Age of Onset   Hypertension Mother    Heart disease Mother        Mitral valve replacement.    Diabetes Mother    Liver cancer Father 45       HCC   Diabetes Brother    Lung cancer Brother 61       mets   Melanoma Brother 21       neck and back   Cancer Paternal Uncle        unknown type; dx after 25   Breast cancer Paternal Grandmother        dx 71s   Ovarian cancer Cousin        paternal cousin; d. 30s   Cancer Cousin        GYN; d. 8s   Colon cancer Neg Hx    Colon polyps Neg Hx    Esophageal cancer Neg Hx    Rectal cancer Neg Hx    Stomach cancer Neg Hx    Social History   Socioeconomic History   Marital status: Legally Separated    Spouse name: Not on file   Number of children: Not on file   Years of education: Not on file   Highest education level: Not on file   Occupational History   Occupation: disable    Employer: DISABLED  Tobacco Use   Smoking status: Never   Smokeless tobacco: Never  Vaping Use   Vaping status: Never Used  Substance and Sexual Activity   Alcohol  use: No   Drug use: No   Sexual activity: Not on file    Comment: second hand smoke  Other Topics Concern   Not on file  Social History Narrative   Has one child lives at home with her.    Social Drivers of Corporate investment banker Strain: Not on file  Food Insecurity: Not on file  Transportation Needs: Not on file  Physical Activity: Not on file  Stress: Not on file  Social Connections: Not on file   Past Surgical History:  Procedure Laterality Date   ABDOMINAL HYSTERECTOMY  1987   W/ BSO   ANTERIOR CERVICAL DECOMP/DISCECTOMY FUSION N/A 08/09/2012   Procedure: ANTERIOR CERVICAL DECOMPRESSION/DISCECTOMY FUSION 1 LEVEL;  Surgeon: Catalina CHRISTELLA Stains, MD;  Location: [facility];  Service: Neurosurgery;  Laterality:  N/A;  Cervical four-five  Anterior cervical decompression/diskectomy/fusion   ANTERIOR FUSION CERVICAL SPINE  2007   C5 -6   APPENDECTOMY     APPLICATION OF INTRAOPERATIVE CT SCAN N/A 05/17/2017   Procedure: APPLICATION OF INTRAOPERATIVE CT SCAN;  Surgeon: Unice Pac, MD;  Location[facility];  Service: Neurosurgery;  Laterality: N/A;   BACK SURGERY     x 5   BREAST EXCISIONAL BIOPSY Bilateral    No scar seen    BREAST SURGERY     x 2 biopsies   CARPAL TUNNEL RELEASE  RIGHT - 2001  & DEC 2011 W/ BACK SURG.   CERVICAL DISC SURGERY  2005   C5 - 6   CHOLECYSTECTOMY  1994   COLONOSCOPY WITH PROPOFOL  N/A 05/16/2022   Procedure: COLONOSCOPY WITH PROPOFOL ;  Surgeon: Eartha Angelia Sieving, MD;  [facility];  Service: Gastroenterology;  Laterality: N/A;  945 ASA 3   DORSAL COMPARTMENT RELEASE Right 04/07/2013   Procedure: RIGHT WRIST STS RELEASE;  Surgeon: Donnice DELENA Robinsons, MD;  Location: [facility];  Service: Orthopedics;   Laterality: Right;   ENDOVENOUS ABLATION SAPHENOUS VEIN W/ LASER Right 05/29/2019   endovenous laser ablation right greater saphenous vein by Medford Blade MD    EXPLORATION OF INCISION FOR CSF LEAK  DEC 2011  X2   POST LAMINECOTMY   FINGER ARTHRODESIS Right 04/07/2013   Procedure: RIGHT INDEX AND RIGHT LONG DISTAL INTERPHALANGEAL JOINT FUSIONS;  Surgeon: Donnice DELENA Robinsons, MD;  Location: [facility];  Service: Orthopedics;  Laterality: Right;   FINGER ARTHROPLASTY Right 04/07/2013   Procedure: RIGHT THUMB CMC ARTHROPLASTY;  Surgeon: Donnice DELENA Robinsons, MD;  Location: [facility];  Service: Orthopedics;  Laterality: Right;   GANGLION CYST EXCISION Right 04/07/2013   Procedure: RIGHT WRIST MASS EXCISION;  Surgeon: Donnice DELENA Robinsons, MD;  Location: [facility];  Service: Orthopedics;  Laterality: Right;   KNEE ARTHROSCOPY  LEFT X3 (LAST ONE 2005)   KNEE ARTHROSCOPY  05/17/2011   Procedure: ARTHROSCOPY KNEE;  Surgeon: Norleen LITTIE Hoard III;  Location: [facility];  Service: Orthopedics;  Laterality: Right;  WITH MEDIAL MENISECTOMY AND removal of suprapatella fat lump   LEFT WRIST TENOSYNECTOMY W/ LEFT THUMB JOINT REPAIR  02-24-10   LUMBAR FUSION  11-30-10   L4 - 5   LUMBAR FUSION  2000   L2 - 4   LUMBAR LAMINECTOMY  DEC 2011   L4 - 5   POSTERIOR CERVICAL FUSION/FORAMINOTOMY N/A 05/17/2017   Procedure: Cervical five  to Thorastic one  Posterior cervical fusion with lateral mass screws/revision of prior instrumentation;  Surgeon: Unice Pac, MD;  Location: [facility] OR;  Service: Neurosurgery;  Laterality: N/A;  C5 to T1 Posterior cervical fusion with lateral mass screws/revision of prior instrumentation   TENDON REPAIR  JAN 2010   LEFT INDEX AND LONG FINGERS   THUMB SURGERY   04/07/2019   RIGHT THUMB   TUBAL LIGATION     Past Surgical History:  Procedure Laterality Date   ABDOMINAL HYSTERECTOMY  1987   W/ BSO   ANTERIOR CERVICAL  DECOMP/DISCECTOMY FUSION N/A 08/09/2012   Procedure: ANTERIOR CERVICAL DECOMPRESSION/DISCECTOMY FUSION 1 LEVEL;  Surgeon: Catalina CHRISTELLA Stains, MD;  Location: [facility];  Service: Neurosurgery;  Laterality: N/A;  Cervical four-five  Anterior cervical decompression/diskectomy/fusion   ANTERIOR FUSION CERVICAL SPINE  2007   C5 -6   APPENDECTOMY     APPLICATION OF INTRAOPERATIVE CT SCAN N/A 05/17/2017  Procedure: APPLICATION OF INTRAOPERATIVE CT SCAN;  Surgeon: Unice Pac, MD;  Location: [facility] OR;  Service: Neurosurgery;  Laterality: N/A;   BACK SURGERY     x 5   BREAST EXCISIONAL BIOPSY Bilateral    No scar seen    BREAST SURGERY     x 2 biopsies   CARPAL TUNNEL RELEASE  RIGHT - 2001  & DEC 2011 W/ BACK SURG.   CERVICAL DISC SURGERY  2005   C5 - 6   CHOLECYSTECTOMY  1994   COLONOSCOPY WITH PROPOFOL  N/A 05/16/2022   Procedure: COLONOSCOPY WITH PROPOFOL ;  Surgeon: Eartha Angelia Sieving, MD;  [facility];  Service: Gastroenterology;  Laterality: N/A;  945 ASA 3   DORSAL COMPARTMENT RELEASE Right 04/07/2013   Procedure: RIGHT WRIST STS RELEASE;  Surgeon: Donnice DELENA Robinsons, MD;  Location: [facility];  Service: Orthopedics;  Laterality: Right;   ENDOVENOUS ABLATION SAPHENOUS VEIN W/ LASER Right 05/29/2019   endovenous laser ablation right greater saphenous vein by Medford Blade MD    EXPLORATION OF INCISION FOR CSF LEAK  DEC 2011  X2   POST LAMINECOTMY   FINGER ARTHRODESIS Right 04/07/2013   Procedure: RIGHT INDEX AND RIGHT LONG DISTAL INTERPHALANGEAL JOINT FUSIONS;  Surgeon: Donnice DELENA Robinsons, MD;  Location: [facility];  Service: Orthopedics;  Laterality: Right;   FINGER ARTHROPLASTY Right 04/07/2013   Procedure: RIGHT THUMB CMC ARTHROPLASTY;  Surgeon: Donnice DELENA Robinsons, MD;  Location: [facility];  Service: Orthopedics;  Laterality: Right;   GANGLION CYST EXCISION Right 04/07/2013   Procedure: RIGHT WRIST MASS EXCISION;  Surgeon:  Donnice DELENA Robinsons, MD;  Location: [facility];  Service: Orthopedics;  Laterality: Right;   KNEE ARTHROSCOPY  LEFT X3 (LAST ONE 2005)   KNEE ARTHROSCOPY  05/17/2011   Procedure: ARTHROSCOPY KNEE;  Surgeon: Norleen LITTIE Hoard III;  Location: [facility];  Service: Orthopedics;  Laterality: Right;  WITH MEDIAL MENISECTOMY AND removal of suprapatella fat lump   LEFT WRIST TENOSYNECTOMY W/ LEFT THUMB JOINT REPAIR  02-24-10   LUMBAR FUSION  11-30-10   L4 - 5   LUMBAR FUSION  2000   L2 - 4   LUMBAR LAMINECTOMY  DEC 2011   L4 - 5   POSTERIOR CERVICAL FUSION/FORAMINOTOMY N/A 05/17/2017   Procedure: Cervical five  to Thorastic one  Posterior cervical fusion with lateral mass screws/revision of prior instrumentation;  Surgeon: Unice Pac, MD;  Location: [facility];  Service: Neurosurgery;  Laterality: N/A;  C5 to T1 Posterior cervical fusion with lateral mass screws/revision of prior instrumentation   TENDON REPAIR  JAN 2010   LEFT INDEX AND LONG FINGERS   THUMB SURGERY   04/07/2019   RIGHT THUMB   TUBAL LIGATION     Past Medical History:  Diagnosis Date   Acute sinusitis, unspecified    Allergy     Anemia    quite a few times   Anxiety state, unspecified    Arachnoiditis BILATERAL LEGS   DUE TO MULTIPLE BACK SURG.'S   Arthritis    Asthma    last flare up was 03/2017 lasted over a month   Blood transfusion    Cancer (HCC)    skin cancers (in scalp)   Cardiomyopathy HX --06/2010   EF was 25% during acute illness (PHELONEPHRITIS) Repeat echo 12-06-10 60% showed normal EF.    Chronic back pain greater than 3 months duration    S/P BACK SURG'S   CSF leak  Diabetes mellitus without complication (HCC)    dx 2013 type 2   Dyslipidemia    Dysphagia    some post op cerv fusion 2/14   Dysrhythmia    Essential hypertension, benign    Family history of adverse reaction to anesthesia    mother gets n/v   Family history of breast cancer 02/06/2022   Family history  of liver cancer 02/06/2022   Family history of lung cancer 02/06/2022   Family history of ovarian cancer 02/06/2022   GERD (gastroesophageal reflux disease) AND HIATIAL HERNIA   CONTROLLED W/ NEXIUM   Headache(784.0)    History of chronic bronchitis    Hx of bladder infections    Hyperlipidemia    TAKE CHLOSTEROL MEDICATION,04/23/19   Hypertension    Neuromuscular disorder (HCC)    numbness and tingling   Osteoporosis    Other malaise and fatigue    PONV (postoperative nausea and vomiting)    Shortness of breath    Spinal headache    Spinal stenosis, cervical region    Varicosities    venous   Weakness of both legs DUE TO ARACHNOIDITIS   OCCASIONAL USES CANE   Ht 5' 5 (1.651 m)   Wt 162 lb (73.5 kg)   BMI 26.96 kg/m   Opioid Risk Score:   Fall Risk Score:  `1  Depression screen PHQ 2/9     11/29/2023    1:30 PM 10/30/2023    1:38 PM 09/25/2023    1:54 PM 08/31/2023    1:44 PM 07/05/2023    2:51 PM 05/04/2023    1:27 PM 04/06/2023    1:30 PM  Depression screen PHQ 2/9  Decreased Interest 0 0 0 1 0 1 0  Down, Depressed, Hopeless 0 0 0 1 0 1 0  PHQ - 2 Score 0 0 0 2 0 2 0    Review of Systems  Musculoskeletal:  Positive for back pain and neck pain.       Right shoulder pain, left side pain, pain both legs, right arm down elbow  All other systems reviewed and are negative.      Objective:   Physical Exam Vitals and nursing note reviewed.  Constitutional:      Appearance: Normal appearance.  Cardiovascular:     Rate and Rhythm: Normal rate and regular rhythm.     Pulses: Normal pulses.     Heart sounds: Normal heart sounds.  Pulmonary:     Effort: Pulmonary effort is normal.     Breath sounds: Normal breath sounds.  Musculoskeletal:     Comments: Normal Muscle Bulk and Muscle Testing Reveals:  Upper Extremities: Right: Decreased ROM 30 Degrees and Muscle Strength  3/5 Left Upper Extremity: Full ROM and Muscle Strength 5/5 Bilateral AC Joint  Tenderness Thoracic and Lumbar Hypersensitivity Lower Extremities: Full ROM and Muscle Strength 5/5 Arises from Table Slowly Narrow Based  Gait     Skin:    General: Skin is warm and dry.  Neurological:     Mental Status: She is alert and oriented to person, place, and time.  Psychiatric:        Mood and Affect: Mood normal.        Behavior: Behavior normal.          Assessment & Plan:  1.  On 05/17/2017 :C5C6C7 T1 Posterior Cervical Fusion with lateral mass fixation with AIRO Imaging, revision of prior instrumentation. By Dr. Unice.  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 Continue current medication regimen with  Gabapentin  800 mg TID. Refilled: Oxycodone  15mg  one tablet 5 times a day  as needed for pain # 150 tablets We will continue the opioid monitoring program, this consists of regular clinic visits, examinations, urine drug screen, pill counts as well as use of [place]  Controlled Substance Reporting system. 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 Continue to Monitor. 01/28/2024 3. Anxiety/depression: PCP Following. Continue to monitor. 01/28/2024. 4. Muscle Spasms: Continue current medication regime with Flexeril . Continue to Monitor. 01/28/2024 5. Cervicalgia/ Cervical Radiculitis: Dr Unice Following. ,Continue current medication regime with Gabapentin :  S/P  C5C6C7 T1 Posterior Cervical Fusion with lateral mass fixation with AIRO Imaging, revision of prior instrumentation by Dr. Unice on 05/17/2017. 01/28/2024 7. Bilateral Thoracic Back Pain: Continue HEP as Tolerated and Continue current medication regimen. Continue to Monitor. 01/28/2024  8. Left lower extremity DVT/ Phlebitis:  S/P endovenous laser ablation of left great saphenous vein on  05/09/2018 by Dr. Eliza:  Vascular  Following. 01/28/2024. 9. Insomnia: Continue Pamelor . Continue to Monitor. 01/28/2024. 10.Right Shoulder Pain / Right Shoulder Tendonitis: [facility] Following.Scheduled for Surgery: on 02/01/2024  ARTHROSCOPY, SHOULDER, WITH ROTATOR CUFF REPAIR Right Choice  TENODESIS, BICEPS      Continue to monitor.  01/28/2024.   F/U in 2 months

## 2024-02-01 ENCOUNTER — Ambulatory Visit (HOSPITAL_BASED_OUTPATIENT_CLINIC_OR_DEPARTMENT_OTHER)
Admission: RE | Admit: 2024-02-01 | Discharge: 2024-02-01 | Disposition: A | Discharge status: Home / Self Care | Attending: Orthopedic Surgery | Admitting: Orthopedic Surgery

## 2024-02-01 ENCOUNTER — Other Ambulatory Visit: Payer: Self-pay

## 2024-02-01 ENCOUNTER — Ambulatory Visit (HOSPITAL_BASED_OUTPATIENT_CLINIC_OR_DEPARTMENT_OTHER): Admitting: Certified Registered"

## 2024-02-01 ENCOUNTER — Encounter (HOSPITAL_BASED_OUTPATIENT_CLINIC_OR_DEPARTMENT_OTHER): Payer: Self-pay | Admitting: Orthopedic Surgery

## 2024-02-01 ENCOUNTER — Encounter (HOSPITAL_BASED_OUTPATIENT_CLINIC_OR_DEPARTMENT_OTHER)
Admission: RE | Disposition: A | Discharge status: Home / Self Care | Payer: Self-pay | Source: Home / Self Care | Attending: Orthopedic Surgery

## 2024-02-01 DIAGNOSIS — I10 Essential (primary) hypertension: Secondary | ICD-10-CM

## 2024-02-01 DIAGNOSIS — E119 Type 2 diabetes mellitus without complications: Secondary | ICD-10-CM | POA: Diagnosis not present

## 2024-02-01 DIAGNOSIS — X58XXXA Exposure to other specified factors, initial encounter: Secondary | ICD-10-CM | POA: Diagnosis not present

## 2024-02-01 DIAGNOSIS — S43431A Superior glenoid labrum lesion of right shoulder, initial encounter: Secondary | ICD-10-CM | POA: Diagnosis not present

## 2024-02-01 DIAGNOSIS — J449 Chronic obstructive pulmonary disease, unspecified: Secondary | ICD-10-CM | POA: Diagnosis not present

## 2024-02-01 DIAGNOSIS — M75121 Complete rotator cuff tear or rupture of right shoulder, not specified as traumatic: Secondary | ICD-10-CM | POA: Insufficient documentation

## 2024-02-01 DIAGNOSIS — Z7984 Long term (current) use of oral hypoglycemic drugs: Secondary | ICD-10-CM | POA: Diagnosis not present

## 2024-02-01 DIAGNOSIS — S46211A Strain of muscle, fascia and tendon of other parts of biceps, right arm, initial encounter: Secondary | ICD-10-CM | POA: Insufficient documentation

## 2024-02-01 DIAGNOSIS — M7541 Impingement syndrome of right shoulder: Secondary | ICD-10-CM

## 2024-02-01 DIAGNOSIS — Z01818 Encounter for other preprocedural examination: Secondary | ICD-10-CM

## 2024-02-01 DIAGNOSIS — M25811 Other specified joint disorders, right shoulder: Secondary | ICD-10-CM | POA: Insufficient documentation

## 2024-02-01 DIAGNOSIS — M75111 Incomplete rotator cuff tear or rupture of right shoulder, not specified as traumatic: Secondary | ICD-10-CM | POA: Diagnosis present

## 2024-02-01 DIAGNOSIS — F32A Depression, unspecified: Secondary | ICD-10-CM | POA: Diagnosis not present

## 2024-02-01 DIAGNOSIS — F419 Anxiety disorder, unspecified: Secondary | ICD-10-CM | POA: Diagnosis not present

## 2024-02-01 DIAGNOSIS — G8918 Other acute postprocedural pain: Secondary | ICD-10-CM | POA: Diagnosis not present

## 2024-02-01 HISTORY — PX: BICEPT TENODESIS: SHX5116

## 2024-02-01 HISTORY — PX: SHOULDER ARTHROSCOPY WITH ROTATOR CUFF REPAIR: SHX5685

## 2024-02-01 LAB — GLUCOSE, CAPILLARY
Glucose-Capillary: 100 mg/dL — ABNORMAL HIGH (ref 70–99)
Glucose-Capillary: 92 mg/dL (ref 70–99)

## 2024-02-01 SURGERY — ARTHROSCOPY, SHOULDER, WITH ROTATOR CUFF REPAIR
Anesthesia: General | Site: Shoulder | Laterality: Right

## 2024-02-01 MED ORDER — ONDANSETRON 4 MG PO TBDP: 4.0000 mg | ORAL_TABLET | Freq: Three times a day (TID) | ORAL | 0 refills | Status: DC | PRN

## 2024-02-01 MED ORDER — CEFAZOLIN SODIUM-DEXTROSE 2-4 GM/100ML-% IV SOLN
2.0000 g | INTRAVENOUS | Status: AC
  Administered 2024-02-01: 2 g via INTRAVENOUS

## 2024-02-01 MED ORDER — LIDOCAINE 2% (20 MG/ML) 5 ML SYRINGE
INTRAMUSCULAR | Status: DC | PRN
  Administered 2024-02-01: 40 mg via INTRAVENOUS

## 2024-02-01 MED ORDER — ROCURONIUM BROMIDE 10 MG/ML (PF) SYRINGE
PREFILLED_SYRINGE | INTRAVENOUS | Status: AC
  Filled 2024-02-01: qty 10

## 2024-02-01 MED ORDER — DEXAMETHASONE SODIUM PHOSPHATE 10 MG/ML IJ SOLN
INTRAMUSCULAR | Status: AC
  Filled 2024-02-01: qty 1

## 2024-02-01 MED ORDER — OXYCODONE HCL 5 MG PO TABS: 5.0000 mg | ORAL_TABLET | Freq: Once | ORAL | Status: DC | PRN

## 2024-02-01 MED ORDER — FENTANYL CITRATE (PF) 100 MCG/2ML IJ SOLN: 25.0000 ug | INTRAMUSCULAR | Status: DC | PRN

## 2024-02-01 MED ORDER — PROPOFOL 10 MG/ML IV BOLUS
INTRAVENOUS | Status: DC | PRN
  Administered 2024-02-01: 120 mg via INTRAVENOUS

## 2024-02-01 MED ORDER — BUPIVACAINE HCL (PF) 0.5 % IJ SOLN
INTRAMUSCULAR | Status: DC | PRN
Start: 2024-02-01 — End: 2024-02-01
  Administered 2024-02-01: 15 mL via PERINEURAL

## 2024-02-01 MED ORDER — ONDANSETRON HCL 4 MG/2ML IJ SOLN: 4.0000 mg | Freq: Four times a day (QID) | INTRAMUSCULAR | Status: DC | PRN

## 2024-02-01 MED ORDER — OXYCODONE HCL 5 MG/5ML PO SOLN: 5.0000 mg | Freq: Once | ORAL | Status: DC | PRN

## 2024-02-01 MED ORDER — ONDANSETRON HCL 4 MG/2ML IJ SOLN
INTRAMUSCULAR | Status: DC | PRN
  Administered 2024-02-01: 4 mg via INTRAVENOUS

## 2024-02-01 MED ORDER — FENTANYL CITRATE (PF) 100 MCG/2ML IJ SOLN
100.0000 ug | Freq: Once | INTRAMUSCULAR | Status: AC
  Administered 2024-02-01: 50 ug via INTRAVENOUS

## 2024-02-01 MED ORDER — MIDAZOLAM HCL 2 MG/2ML IJ SOLN
INTRAMUSCULAR | Status: AC
  Filled 2024-02-01: qty 2

## 2024-02-01 MED ORDER — CEFAZOLIN SODIUM-DEXTROSE 2-4 GM/100ML-% IV SOLN
INTRAVENOUS | Status: AC
  Filled 2024-02-01: qty 100

## 2024-02-01 MED ORDER — ROCURONIUM BROMIDE 10 MG/ML (PF) SYRINGE
PREFILLED_SYRINGE | INTRAVENOUS | Status: DC | PRN
  Administered 2024-02-01: 50 mg via INTRAVENOUS

## 2024-02-01 MED ORDER — HYDROMORPHONE HCL 2 MG PO TABS: 2.0000 mg | ORAL_TABLET | ORAL | 0 refills | Status: AC | PRN

## 2024-02-01 MED ORDER — SUGAMMADEX SODIUM 200 MG/2ML IV SOLN
INTRAVENOUS | Status: DC | PRN
  Administered 2024-02-01: 200 mg via INTRAVENOUS

## 2024-02-01 MED ORDER — FENTANYL CITRATE (PF) 100 MCG/2ML IJ SOLN
INTRAMUSCULAR | Status: AC
  Filled 2024-02-01: qty 2

## 2024-02-01 MED ORDER — SODIUM CHLORIDE 0.9 % IR SOLN
Status: DC | PRN
  Administered 2024-02-01: 6000 mL

## 2024-02-01 MED ORDER — BUPIVACAINE LIPOSOME 1.3 % IJ SUSP
INTRAMUSCULAR | Status: DC | PRN
  Administered 2024-02-01: 10 mL via PERINEURAL

## 2024-02-01 MED ORDER — LIDOCAINE 2% (20 MG/ML) 5 ML SYRINGE
INTRAMUSCULAR | Status: AC
  Filled 2024-02-01: qty 5

## 2024-02-01 MED ORDER — LACTATED RINGERS IV SOLN: INTRAVENOUS | Status: DC

## 2024-02-01 MED ORDER — DEXAMETHASONE SODIUM PHOSPHATE 10 MG/ML IJ SOLN
INTRAMUSCULAR | Status: DC | PRN
  Administered 2024-02-01: 5 mg via INTRAVENOUS

## 2024-02-01 MED ORDER — MIDAZOLAM HCL 2 MG/2ML IJ SOLN: 2.0000 mg | Freq: Once | INTRAMUSCULAR | Status: DC

## 2024-02-01 MED ORDER — FENTANYL CITRATE (PF) 100 MCG/2ML IJ SOLN
INTRAMUSCULAR | Status: AC
Start: 2024-02-01 — End: 2024-02-01
  Filled 2024-02-01: qty 2

## 2024-02-01 MED ORDER — PROPOFOL 500 MG/50ML IV EMUL
INTRAVENOUS | Status: AC
  Filled 2024-02-01: qty 50

## 2024-02-01 MED ORDER — ONDANSETRON HCL 4 MG/2ML IJ SOLN
INTRAMUSCULAR | Status: AC
  Filled 2024-02-01: qty 2

## 2024-02-01 SURGICAL SUPPLY — 45 items
ANCHOR FIBERTAK 2.6X1.7 BLUE (Anchor) IMPLANT
ANCHOR SUT FBRTK 2.6 SP1.7 TAP (Anchor) IMPLANT
ANCHOR SWIVELOCK SP KL 4.75 (Anchor) IMPLANT
BURR OVAL 8 FLU 4.0X13 (MISCELLANEOUS) ×1 IMPLANT
CANNULA 5.75X71 LONG (CANNULA) IMPLANT
CANNULA PASSPORT 5 (CANNULA) IMPLANT
CANNULA PASSPORT BUTTON 10-40 (CANNULA) IMPLANT
CANNULA TWIST IN 8.25X7CM (CANNULA) IMPLANT
CLSR STERI-STRIP ANTIMIC 1/2X4 (GAUZE/BANDAGES/DRESSINGS) ×1 IMPLANT
COOLER ICEMAN CLASSIC (MISCELLANEOUS) ×1 IMPLANT
CUTTER BONE 4.0MM X 13CM (MISCELLANEOUS) ×1 IMPLANT
DRAPE INCISE IOBAN 66X45 STRL (DRAPES) ×1 IMPLANT
DRAPE STERI 35X30 U-POUCH (DRAPES) ×1 IMPLANT
DRAPE SURG 17X23 STRL (DRAPES) ×1 IMPLANT
DRAPE U-SHAPE 47X51 STRL (DRAPES) ×1 IMPLANT
DRAPE U-SHAPE 76X120 STRL (DRAPES) ×2 IMPLANT
DRSG TEGADERM 4X4.75 (GAUZE/BANDAGES/DRESSINGS) IMPLANT
DURAPREP 26ML APPLICATOR (WOUND CARE) ×1 IMPLANT
GAUZE PAD ABD 8X10 STRL (GAUZE/BANDAGES/DRESSINGS) ×1 IMPLANT
GAUZE SPONGE 4X4 12PLY STRL (GAUZE/BANDAGES/DRESSINGS) ×1 IMPLANT
GLOVE BIO SURGEON STRL SZ7.5 (GLOVE) ×2 IMPLANT
GLOVE BIOGEL PI IND STRL 8 (GLOVE) ×2 IMPLANT
GOWN STRL REUS W/ TWL LRG LVL3 (GOWN DISPOSABLE) ×1 IMPLANT
GOWN STRL REUS W/TWL XL LVL3 (GOWN DISPOSABLE) ×2 IMPLANT
MANIFOLD NEPTUNE II (INSTRUMENTS) ×1 IMPLANT
NDL HD SCORPION MEGA LOADER (NEEDLE) IMPLANT
NDL SAFETY ECLIPSE 18X1.5 (NEEDLE) ×1 IMPLANT
PACK ARTHROSCOPY DSU (CUSTOM PROCEDURE TRAY) ×1 IMPLANT
PACK BASIN DAY SURGERY FS (CUSTOM PROCEDURE TRAY) ×1 IMPLANT
PAD COLD SHLDR WRAP-ON (PAD) ×1 IMPLANT
SLEEVE ARM SUSPENSION SYSTEM (MISCELLANEOUS) ×1 IMPLANT
SLEEVE SCD COMPRESS KNEE MED (STOCKING) ×1 IMPLANT
SLING ARM FOAM STRAP LRG (SOFTGOODS) IMPLANT
SLING S3 LATERAL DISP (MISCELLANEOUS) IMPLANT
SLING SHLDR PAD UNIV <17 (SOFTGOODS) IMPLANT
SUT MNCRL AB 3-0 PS2 18 (SUTURE) ×1 IMPLANT
SUT PDS AB 1 CT 36 (SUTURE) IMPLANT
SUTURE TAPE TIGERLINK 1.3MM BL (SUTURE) IMPLANT
SYR 5ML LL (SYRINGE) ×1 IMPLANT
SYSTEM TENODESIS BC LNT 3.9 (Orthopedic Implant) IMPLANT
TAPE FIBER 2MM 7IN #2 BLUE (SUTURE) IMPLANT
TOWEL GREEN STERILE FF (TOWEL DISPOSABLE) ×2 IMPLANT
TUBE CONNECTING 20X1/4 (TUBING) ×1 IMPLANT
TUBING ARTHROSCOPY IRRIG 16FT (MISCELLANEOUS) ×1 IMPLANT
WAND ABLATOR APOLLO I90 (BUR) ×1 IMPLANT

## 2024-02-01 NOTE — Op Note (Signed)
 Date of Surgery: 02/01/2024  INDICATIONS: [name] [name] is a 73 y.o.-year-old female with a right shoulder rotator cuff tear, biceps tendon tear, and impingement.;  The patient did consent to the procedure after discussion of the risks and benefits.  PREOPERATIVE DIAGNOSIS:  1.  Right shoulder full-thickness rotator cuff tear 2.  Right proximal biceps tendon tear 3.  Right shoulder subacromial impingement  POSTOPERATIVE DIAGNOSIS:  1.  Right shoulder full-thickness rotator cuff tear 2.  Right proximal biceps tendon tear 3.  Right shoulder subacromial impingement 4.  Right shoulder degenerative labral tearing anterior, superior  PROCEDURE: \ 1.  Right shoulder arthroscopic extensive debridement of superior labral, anterior labral, rotator interval as well as subacromial bursa 2.  Right shoulder arthroscopic rotator cuff repair of partial tear of the subscapularis as well as full-thickness tear of the supraspinatus 3.  Right shoulder arthroscopic biceps tenodesis 4.  Right shoulder arthroscopic subacromial decompression  SURGEON: Selinda SHAUNNA Gosling, M.D.  ASSIST: Dayle Moores, PA-C  Assistant attestation:  PA Mcclung scrubbed and present for the entire procedure..  ANESTHESIA:  general, interscalene with Exparel   IV FLUIDS AND URINE: See anesthesia.  ESTIMATED BLOOD LOSS: 10 mL.  IMPLANTS:  3.5 mm Arthrex lock anchor for subscapularis repair as well as biceps tenodesis Two 2.6 mm Arthrex rotator cuff fiber tack anchors medial row To self punching knotless 4.75 mm swivel lock anchors for lateral row  DRAINS: None  COMPLICATIONS: None.  DESCRIPTION OF PROCEDURE: The patient was brought to the operating room and placed supine on the operating table.  The patient had been signed prior to the procedure and this was documented. The patient had the anesthesia placed by the anesthesiologist.  A time-out was performed to confirm that this was the correct patient, site, side and location. The  patient did receive antibiotics prior to the incision and was re-dosed during the procedure as needed at indicated intervals.  A tourniquet was not placed.  The patient had the operative extremity prepped and draped in the standard surgical fashion.      After obtaining informed consent the patient was brought to the operating table and underwent satisfactory anesthesia. An exam under anesthesia revealed full range of motion. She was placed in the left lateral decubitus position with an axillary roll and all bony prominences properly padded. A standard surgical timeout was performed. He was placed in 10 pounds of gentle in-line suspension.  Standard posterior and anterior superior portals were established. A diagnostic evaluation of the glenohumeral joint was performed.  Diagnostic arthroscopy demonstrated no glenohumeral joint arthritis.  She did have degenerative labral tear to anterior labrum, supra labrum and posterior labrum.  There was tearing of the proximal biceps as well that was partial-thickness.  There was also open border tearing of the subscapularis that was approximately 10%.  Significant synovitis and thickening of the rotator interval tissue.  There was full-thickness tear of the supraspinatus.  Infraspinatus and teres minor are intact.  No loose bodies encountered in the joint.  Next, we established a working portal of the rotator interval as well as immobilization treatment of his decannulated.  We then prepared the lesser tuberosity for the repair of the subscapularis as well as a biceps tenodesis.  To the motorized shaver.  Next, we placed a luggage tag suture across the upper border of the subscapularis anteriorly with antegrade suture lasso.  We snapped this to the side so that we can then incorporate the biceps tenodesis into the same anchor.  We then moved  ahead with the arthroscopic biceps tenodesis.  We utilized an Arthrex 3.5 mm swivel lock anchor in a loop and tack method.  We  first placed a luggage tag suture around the long head of the biceps and then used an antegrade suture passer to pierce the mid substance of the long head of biceps tendon just distal to the luggage tag suture.  With long head of biceps from the superior labrum.  We then were in the biceps suture the subscapularis suture into a 3.5 mm swivel lock anchor.  Pilot hole was placed in the lesser tuberosity as previously prepared biologically.  We then impacted the anchors and we created a nice secure fit.  The subscapularis upper border was repaired nicely as well as the long head of biceps tendon.  Next, we went ahead with our extensive debridement.  Utilizing arthroscopic shaver from the mid glenoid portal we made any unstable and friable tissue of the anterior labrum and superior labrum.  This back to stable margins.  We next, debrided the rotator interval tissue using a ligament.  The subacromial space we did a wide subacromial synovectomy.  The arthroscope was inserted in the subacromial space and an additional lateral portal was established. An acromioplasty performed nicely decompressing the subacromial space with a motorized burr. Bursitis in the subacromial space was removed as well as releases were performed on the bursal surface of the rotator cuff.   We then prepared for our rotator cuff repair.  This tear measured 1.5 cm from anterior to posterior and 1.5 cm from medial to lateral.  This was a crescent shaped tear of the supraspinatus.  A biologic repair of the greater tuberosity with motorized shaver to gently decorticate the bone.  We then freshened up the leading edge of the tear.  Next, we placed 2 separate 2.6 mm Arthrex rotator cuff fiber tack anchors at the articular margin.  We then passed the fiber tapes at the myotendinous junction with a Arthrex scorpion suture lasso device.  We then divided these.  We then brought our lateral row anchors.  1 suture from each medial row anchor was placed  into the anterior swivel lock anchor in the posterior lateral to the left anchor.   Double row repair was obtained by securing all sutures to two 4.75 swivel lock anchors. The bone was of good quality. This completed the rotator cuff repair.  The arthroscope was then removed and portals closed with 3-0 Monocryl in standard fashion followed by a sterile occlusive dressing Polar Care ice sleeve and a slingshot sling. The patient was sent to recovery in stable condition and tolerated the procedure well  POSTOPERATIVE PLAN:  [name] [name] will be in the abduction sling.  She will be on the less than 3 cm rotator cuff repair.  Follow up with me office in 2 weeks.  Discharge home today from PACU.

## 2024-02-01 NOTE — Anesthesia Preprocedure Evaluation (Signed)
 Anesthesia Evaluation  Patient identified by MRN, date of birth, ID band Patient awake    Reviewed: Allergy  & Precautions, H&P , NPO status , Patient's Chart, lab work & pertinent test results  History of Anesthesia Complications (+) PONV and history of anesthetic complications  Airway Mallampati: II   Neck ROM: full    Dental   Pulmonary shortness of breath, asthma    breath sounds clear to auscultation       Cardiovascular hypertension, + dysrhythmias  Rhythm:regular Rate:Normal     Neuro/Psych  Headaches PSYCHIATRIC DISORDERS Anxiety Depression     Neuromuscular disease    GI/Hepatic ,GERD  ,,  Endo/Other  diabetes, Type 2    Renal/GU      Musculoskeletal  (+) Arthritis ,    Abdominal   Peds  Hematology   Anesthesia Other Findings   Reproductive/Obstetrics                              Anesthesia Physical Anesthesia Plan  ASA: 3  Anesthesia Plan: General   Post-op Pain Management: Regional block*   Induction: Intravenous  PONV Risk Score and Plan: 4 or greater and Ondansetron , Dexamethasone  and Treatment may vary due to age or medical condition  Airway Management Planned:   Additional Equipment:   Intra-op Plan:   Post-operative Plan: Extubation in OR  Informed Consent: I have reviewed the patients History and Physical, chart, labs and discussed the procedure including the risks, benefits and alternatives for the proposed anesthesia with the patient or authorized representative who has indicated his/her understanding and acceptance.     Dental advisory given  Plan Discussed with: CRNA, Anesthesiologist and Surgeon  Anesthesia Plan Comments:         Anesthesia Quick Evaluation

## 2024-02-01 NOTE — Progress Notes (Signed)
Assisted Dr. Chaney Malling with right, interscalene , ultrasound guided block. Side rails up, monitors on throughout procedure. See vital signs in flow sheet. Tolerated Procedure well.

## 2024-02-01 NOTE — Anesthesia Procedure Notes (Signed)
 Anesthesia Regional Block: Interscalene brachial plexus block   Pre-Anesthetic Checklist: , timeout performed,  Correct Patient, Correct Site, Correct Laterality,  Correct Procedure, Correct Position, site marked,  Risks and benefits discussed,  Surgical consent,  Pre-op evaluation,  At surgeon's request and post-op pain management  Laterality: Right  Prep: chloraprep       Needles:  Injection technique: Single-shot  Needle Type: Echogenic Stimulator Needle     Needle Length: 5cm  Needle Gauge: 22     Additional Needles:   Procedures:, nerve stimulator,,,,,     Nerve Stimulator or Paresthesia:  Response: biceps flexion, 0.45 mA  Additional Responses:   Narrative:  Start time: 02/01/2024 8:56 AM End time: 02/01/2024 9:03 AM Injection made incrementally with aspirations every 5 mL.  Performed by: Personally  Anesthesiologist: Maryclare Cornet, MD  Additional Notes: Functioning IV was confirmed and monitors were applied.  A 50mm 22ga Arrow echogenic stimulator needle was used. Sterile prep and drape,hand hygiene and sterile gloves were used.  Negative aspiration and negative test dose prior to incremental administration of local anesthetic. The patient tolerated the procedure well.  Ultrasound guidance: relevent anatomy identified, needle position confirmed, local anesthetic spread visualized around nerve(s), vascular puncture avoided.  Image printed for medical record.

## 2024-02-01 NOTE — Anesthesia Procedure Notes (Signed)
 Procedure Name: Intubation Date/Time: 02/01/2024 10:04 AM  Performed by: Delayne Olam BIRCH, CRNAPre-anesthesia Checklist: Patient identified, Emergency Drugs available, Suction available and Patient being monitored Patient Re-evaluated:Patient Re-evaluated prior to induction Oxygen Delivery Method: Circle system utilized Preoxygenation: Pre-oxygenation with 100% oxygen Induction Type: IV induction Ventilation: Mask ventilation without difficulty Laryngoscope Size: Mac and Miller Grade View: Grade I Tube type: Oral Tube size: 7.0 mm Number of attempts: 1 Airway Equipment and Method: Stylet and Oral airway Placement Confirmation: ETT inserted through vocal cords under direct vision, positive ETCO2 and breath sounds checked- equal and bilateral Secured at: 22 cm Tube secured with: Tape Dental Injury: Teeth and Oropharynx as per pre-operative assessment

## 2024-02-01 NOTE — H&P (Signed)
 ORTHOPAEDIC right shoulder pain  REQUESTING PHYSICIAN: Sharl Selinda Dover, MD  PCP:  Orpha Yancey LABOR, MD  Chief Complaint: Right shoulder rotator cuff tear  HPI: [name] [name] is a 73 y.o. female who complains of right shoulder pain and weakness.  This has been all longstanding issue.  Here today for arthroscopic rotator cuff repair with debridement and decompression.  No new complaints today.  Past Medical History:  Diagnosis Date   Acute sinusitis, unspecified    Allergy     Anemia    quite a few times   Anxiety state, unspecified    Arachnoiditis BILATERAL LEGS   DUE TO MULTIPLE BACK SURG.'S   Arthritis    Asthma    last flare up was 03/2017 lasted over a month   Blood transfusion    Cancer (HCC)    skin cancers (in scalp)   Cardiomyopathy HX --06/2010   EF was 25% during acute illness (PHELONEPHRITIS) Repeat echo 12-06-10 60% showed normal EF.    Chronic back pain greater than 3 months duration    S/P BACK SURG'S   CSF leak    Diabetes mellitus without complication (HCC)    dx 2013 type 2   Dyslipidemia    Dysphagia    some post op cerv fusion 2/14   Dysrhythmia    Essential hypertension, benign    Family history of adverse reaction to anesthesia    mother gets n/v   Family history of breast cancer 02/06/2022   Family history of liver cancer 02/06/2022   Family history of lung cancer 02/06/2022   Family history of ovarian cancer 02/06/2022   GERD (gastroesophageal reflux disease) AND HIATIAL HERNIA   CONTROLLED W/ NEXIUM   Headache(784.0)    History of chronic bronchitis    Hx of bladder infections    Hyperlipidemia    TAKE CHLOSTEROL MEDICATION,04/23/19   Hypertension    Neuromuscular disorder (HCC)    numbness and tingling   Osteoporosis    Other malaise and fatigue    PONV (postoperative nausea and vomiting)    Shortness of breath    Spinal headache    Spinal stenosis, cervical region    Varicosities    venous   Weakness of both legs DUE TO  ARACHNOIDITIS   OCCASIONAL USES CANE   Past Surgical History:  Procedure Laterality Date   ABDOMINAL HYSTERECTOMY  1987   W/ BSO   ANTERIOR CERVICAL DECOMP/DISCECTOMY FUSION N/A 08/09/2012   Procedure: ANTERIOR CERVICAL DECOMPRESSION/DISCECTOMY FUSION 1 LEVEL;  Surgeon: Catalina CHRISTELLA Stains, MD;  Location: [facility];  Service: Neurosurgery;  Laterality: N/A;  Cervical four-five  Anterior cervical decompression/diskectomy/fusion   ANTERIOR FUSION CERVICAL SPINE  2007   C5 -6   APPENDECTOMY     APPLICATION OF INTRAOPERATIVE CT SCAN N/A 05/17/2017   Procedure: APPLICATION OF INTRAOPERATIVE CT SCAN;  Surgeon: Unice Pac, MD;  Location: [facility];  Service: Neurosurgery;  Laterality: N/A;   BACK SURGERY     x 5   BREAST EXCISIONAL BIOPSY Bilateral    No scar seen    BREAST SURGERY     x 2 biopsies   CARPAL TUNNEL RELEASE  RIGHT - 2001  & DEC 2011 W/ BACK SURG.   CERVICAL DISC SURGERY  2005   C5 - 6   CHOLECYSTECTOMY  1994   COLONOSCOPY WITH PROPOFOL  N/A 05/16/2022   Procedure: COLONOSCOPY WITH PROPOFOL ;  Surgeon: Eartha Angelia Sieving, MD;  Location: [facility];  Service: Gastroenterology;  Laterality: N/A;  945 ASA 3   DORSAL COMPARTMENT RELEASE Right 04/07/2013   Procedure: RIGHT WRIST STS RELEASE;  Surgeon: Donnice DELENA Robinsons, MD;  Location: [facility];  Service: Orthopedics;  Laterality: Right;   ENDOVENOUS ABLATION SAPHENOUS VEIN W/ LASER Right 05/29/2019   endovenous laser ablation right greater saphenous vein by Medford Blade MD    EXPLORATION OF INCISION FOR CSF LEAK  DEC 2011  X2   POST LAMINECOTMY   FINGER ARTHRODESIS Right 04/07/2013   Procedure: RIGHT INDEX AND RIGHT LONG DISTAL INTERPHALANGEAL JOINT FUSIONS;  Surgeon: Donnice DELENA Robinsons, MD;  Location: [facility];  Service: Orthopedics;  Laterality: Right;   FINGER ARTHROPLASTY Right 04/07/2013   Procedure: RIGHT THUMB CMC ARTHROPLASTY;  Surgeon: Donnice DELENA Robinsons, MD;  Location: [facility]  ;  Service: Orthopedics;  Laterality: Right;   GANGLION CYST EXCISION Right 04/07/2013   Procedure: RIGHT WRIST MASS EXCISION;  Surgeon: Donnice DELENA Robinsons, MD;  Location: [facility];  Service: Orthopedics;  Laterality: Right;   KNEE ARTHROSCOPY  LEFT X3 (LAST ONE 2005)   KNEE ARTHROSCOPY  05/17/2011   Procedure: ARTHROSCOPY KNEE;  Surgeon: Norleen LITTIE Hoard III;  Location: [facility];  Service: Orthopedics;  Laterality: Right;  WITH MEDIAL MENISECTOMY AND removal of suprapatella fat lump   LEFT WRIST TENOSYNECTOMY W/ LEFT THUMB JOINT REPAIR  02-24-10   LUMBAR FUSION  11-30-10   L4 - 5   LUMBAR FUSION  2000   L2 - 4   LUMBAR LAMINECTOMY  DEC 2011   L4 - 5   POSTERIOR CERVICAL FUSION/FORAMINOTOMY N/A 05/17/2017   Procedure: Cervical five  to Thorastic one  Posterior cervical fusion with lateral mass screws/revision of prior instrumentation;  Surgeon: Unice Pac, MD;  Location: [facility] OR;  Service: Neurosurgery;  Laterality: N/A;  C5 to T1 Posterior cervical fusion with lateral mass screws/revision of prior instrumentation   TENDON REPAIR  JAN 2010   LEFT INDEX AND LONG FINGERS   THUMB SURGERY   04/07/2019   RIGHT THUMB   TUBAL LIGATION     Social History   Socioeconomic History   Marital status: Legally Separated    Spouse name: Not on file   Number of children: Not on file   Years of education: Not on file   Highest education level: Not on file  Occupational History   Occupation: disable    Employer: DISABLED  Tobacco Use   Smoking status: Never   Smokeless tobacco: Never  Vaping Use   Vaping status: Never Used  Substance and Sexual Activity   Alcohol  use: No   Drug use: No   Sexual activity: Not on file    Comment: second hand smoke  Other Topics Concern   Not on file  Social History Narrative   Has one child lives at home with her.    Social Drivers of Corporate investment banker Strain: Not on file  Food Insecurity:  Not on file  Transportation Needs: Not on file  Physical Activity: Not on file  Stress: Not on file  Social Connections: Not on file   Family History  Problem Relation Age of Onset   Hypertension Mother    Heart disease Mother        Mitral valve replacement.    Diabetes Mother    Liver cancer Father 74       HCC   Diabetes Brother    Lung cancer Brother 92  mets   Melanoma Brother 69       neck and back   Cancer Paternal Uncle        unknown type; dx after 50   Breast cancer Paternal Grandmother        dx 54s   Ovarian cancer Cousin        paternal cousin; d. 4s   Cancer Cousin        GYN; d. 74s   Colon cancer Neg Hx    Colon polyps Neg Hx    Esophageal cancer Neg Hx    Rectal cancer Neg Hx    Stomach cancer Neg Hx    Allergies  Allergen Reactions   Latex Itching, Rash and Other (See Comments)    Other Reaction(s): Not available   Morphine  And Codeine Hives and Itching   Acetaminophen  Nausea And Vomiting    Other Reaction(s): GI Intolerance  Other Reaction(s): Not available  acetaminophen    Fentanyl       I dont like it   Hydrocodone Other (See Comments)    Other Reaction(s): Not available   Hydrocodone-Acetaminophen  Itching    acetaminophen  / hydrocodone   Morphine  Itching and Other (See Comments)    Headaches  Other Reaction(s): Not available, Not available, Not available, Not available, Not available   Oxycodone  Hcl    Oxycodone -Acetaminophen  Itching and Other (See Comments)    acetaminophen  / oxycodone    Oxycodone -Aspirin  Other (See Comments)   Aspirin  Nausea And Vomiting    Can take coated asa  Other Reaction(s): GI Intolerance  Other Reaction(s): Not available  aluminum aspirin    Clarithromycin Diarrhea and Nausea And Vomiting   Clarithromycin Nausea And Vomiting    Other Reaction(s): GI Intolerance  Other Reaction(s): Not available  clarithromycin   Codeine Itching and Nausea And Vomiting    codeine   Darvocet [Propoxyphene  N-Acetaminophen ] Itching and Nausea And Vomiting   Hydrocodone-Acetaminophen  Itching and Nausea And Vomiting   Lortab [Hydrocodone-Acetaminophen ] Itching and Nausea And Vomiting   Percocet [Oxycodone -Acetaminophen ] Itching and Nausea And Vomiting   Tramadol  Nausea And Vomiting and Other (See Comments)   Prior to Admission medications   Medication Sig Start Date End Date Taking? Authorizing Provider  aspirin  EC 81 MG tablet Take 81 mg by mouth at bedtime.    Yes [provider]  Biotin 1000 MCG tablet Take 1,000 mcg by mouth daily.   Yes [provider]  diltiazem  (CARDIZEM  CD) 120 MG 24 hr capsule Take 120 mg by mouth daily.   Yes [provider]  gabapentin  (NEURONTIN ) 800 MG tablet Take 1 tablet (800 mg total) by mouth 3 (three) times daily. 10/30/23  Yes Debby Fidela CROME, NP  losartan -hydrochlorothiazide  (HYZAAR) 50-12.5 MG tablet Take 1 tablet by mouth daily. 05/16/19  Yes [provider]  metFORMIN  (GLUCOPHAGE ) 500 MG tablet Take 500 mg by mouth daily with breakfast. 06/08/15  Yes [provider]  milk thistle 175 MG tablet Take by mouth daily.   Yes [provider]  montelukast  (SINGULAIR ) 10 MG tablet Take 10 mg by mouth daily.   Yes [provider]  nortriptyline  (PAMELOR ) 25 MG capsule Take 1 capsule (25 mg total) by mouth at bedtime. Three month supply 08/31/23  Yes Debby Fidela CROME, NP  omeprazole (PRILOSEC) 40 MG capsule Take 40 mg by mouth daily. 04/30/20  Yes [provider]  simvastatin  (ZOCOR ) 10 MG tablet Take 10 mg by mouth every evening. 06/01/16  Yes [provider]  topiramate  (TOPAMAX ) 25 MG  tablet Take 1 tablet (25 mg total) by mouth 2 (two) times daily. 03/06/23  Yes Debby Fidela CROME, NP  valACYclovir  (VALTREX ) 500 MG tablet Take 500 mg by mouth daily.  04/25/12  Yes [provider]  albuterol  (PROVENTIL ) (2.5 MG/3ML) 0.083% nebulizer solution Take 2.5 mg by nebulization every 4 (four)  hours as needed for wheezing or shortness of breath. 03/31/21   [provider]  albuterol  (VENTOLIN  HFA) 108 (90 Base) MCG/ACT inhaler INHALE 2 PUFFS BY MOUTH EVERY 6 HOURS AS NEEDED FOR WHEEZING OR SHORTNESS OF BREATH 05/20/21   Geronimo Amel, MD  budesonide -formoterol  (SYMBICORT ) 160-4.5 MCG/ACT inhaler Inhale 2 puffs into the lungs 2 (two) times daily. 07/01/19   Parrett, Madelin RAMAN, NP  oxyCODONE  (ROXICODONE ) 15 MG immediate release tablet Take 1 tablet (15 mg total) by mouth 5 (five) times daily as needed for pain. 01/28/24   Debby Fidela CROME, NP  TRUE METRIX BLOOD GLUCOSE TEST test strip daily. 01/20/21   [provider]   No results found.  Positive ROS: All other systems have been reviewed and were otherwise negative with the exception of those mentioned in the HPI and as above.  Physical Exam: General: Alert, no acute distress Cardiovascular: No pedal edema Respiratory: No cyanosis, no use of accessory musculature GI: No organomegaly, abdomen is soft and non-tender Skin: No lesions in the area of chief complaint Neurologic: Sensation intact distally Psychiatric: Patient is competent for consent with normal mood and affect Lymphatic: No axillary or cervical lymphadenopathy  MUSCULOSKELETAL: Right upper extremity is warm and well-perfused with no open wounds or lesions.  Assessment: 1.  Right shoulder rotator cuff tear  2.  Right shoulder biceps tendinitis  3.  Right shoulder impingement  Plan: Plan to proceed today with arthroscopic surgery on the right shoulder with arthroscopic rotator cuff repair, biceps tenodesis and subacromial decompression and debridements.  We again discussed the risk and benefits of the procedure which include but are not limited to bleeding, infection, damage to surrounding nerves and vessels, stiffness, fracture, need for further surgery and progression of disease as well as the risk of anesthesia.  She has provided informed  consent.  Plan for discharge home postoperatively from PACU.    Selinda Belvie Gosling, MD Cell [phone]    02/01/2024 7:36 AM

## 2024-02-01 NOTE — Transfer of Care (Signed)
 Immediate Anesthesia Transfer of Care Note  Patient: [name] [name]  Procedure(s) Performed: Procedure(s) (LRB): ARTHROSCOPY, SHOULDER, WITH ROTATOR CUFF REPAIR (Right) TENODESIS, BICEPS (Right)  Patient Location: PACU  Anesthesia Type: General  Level of Consciousness: awake, oriented, sedated and patient cooperative  Airway & Oxygen Therapy: Patient Spontanous Breathing Room Air  Post-op Assessment: Report given to PACU RN and Post -op Vital signs reviewed and stable  Post vital signs: Reviewed and stable  Complications: No apparent anesthesia complications  Last Vitals:  Vitals Value Taken Time  BP    Temp    Pulse 72 02/01/24 11:13  Resp    SpO2 96 % 02/01/24 11:13  Vitals shown include unfiled device data.  Last Pain:  Vitals:   02/01/24 0807  TempSrc: Temporal  PainSc: 9       Patients Stated Pain Goal: 3 (02/01/24 0807)  Complications: No notable events documented.

## 2024-02-01 NOTE — Discharge Instructions (Addendum)
 Orthopedic surgery discharge instructions:  -Maintain postoperative bandages for 3 days.  You may remove these bandages on post op day 3 and begin showering at that time.  Please do not submerge underwater.  -Maintain your arm in sling at all times.  You should only remove for showering and getting dressed.  No lifting with the operative arm.  -For mild to moderate pain use Tylenol  and Advil in alternating fashion around-the-clock.  For breakthrough pain use oxycodone  as necessary.  -Please apply ice to the right shoulder for 20-30 minutes out of each hour that you are awake.  Do this around-the-clock for the first 3 days from surgery.  -Follow-up in 2 weeks for routine postoperative check.     Post Anesthesia Home Care Instructions  Activity: Get plenty of rest for the remainder of the day. A responsible individual must stay with you for 24 hours following the procedure.  For the next 24 hours, DO NOT: -Drive a car -Advertising copywriter -Drink alcoholic beverages -Take any medication unless instructed by your physician -Make any legal decisions or sign important papers.  Meals: Start with liquid foods such as gelatin or soup. Progress to regular foods as tolerated. Avoid greasy, spicy, heavy foods. If nausea and/or vomiting occur, drink only clear liquids until the nausea and/or vomiting subsides. Call your physician if vomiting continues.  Special Instructions/Symptoms: Your throat may feel dry or sore from the anesthesia or the breathing tube placed in your throat during surgery. If this causes discomfort, gargle with warm salt water. The discomfort should disappear within 24 hours.  If you had a scopolamine  patch placed behind your ear for the management of post- operative nausea and/or vomiting:  1. The medication in the patch is effective for 72 hours, after which it should be removed.  Wrap patch in a tissue and discard in the trash. Wash hands thoroughly with soap and water. 2.  You may remove the patch earlier than 72 hours if you experience unpleasant side effects which may include dry mouth, dizziness or visual disturbances. 3. Avoid touching the patch. Wash your hands with soap and water after contact with the patch.     Information for Discharge Teaching: EXPAREL  (bupivacaine  liposome injectable suspension)   Pain relief is important to your recovery. The goal is to control your pain so you can move easier and return to your normal activities as soon as possible after your procedure. Your physician may use several types of medicines to manage pain, swelling, and more.  Your surgeon or anesthesiologist gave you EXPAREL (bupivacaine ) to help control your pain after surgery.  EXPAREL  is a local anesthetic designed to release slowly over an extended period of time to provide pain relief by numbing the tissue around the surgical site. EXPAREL  is designed to release pain medication over time and can control pain for up to 72 hours. Depending on how you respond to EXPAREL , you may require less pain medication during your recovery. EXPAREL  can help reduce or eliminate the need for opioids during the first few days after surgery when pain relief is needed the most. EXPAREL  is not an opioid and is not addictive. It does not cause sleepiness or sedation.   Important! A teal colored band has been placed on your arm with the date, time and amount of EXPAREL  you have received. Please leave this armband in place for the full 96 hours following administration, and then you may remove the band. If you return to the hospital for any reason within  96 hours following the administration of EXPAREL , the armband provides important information that your health care providers to know, and alerts them that you have received this anesthetic.    Possible side effects of EXPAREL : Temporary loss of sensation or ability to move in the area where medication was injected. Nausea, vomiting,  constipation Rarely, numbness and tingling in your mouth or lips, lightheadedness, or anxiety may occur. Call your doctor right away if you think you may be experiencing any of these sensations, or if you have other questions regarding possible side effects.  Follow all other discharge instructions given to you by your surgeon or nurse. Eat a healthy diet and drink plenty of water or other fluids.   Regional Anesthesia Blocks  1. You may not be able to move or feel the blocked extremity after a regional anesthetic block. This may last may last from 3-48 hours after placement, but it will go away. The length of time depends on the medication injected and your individual response to the medication. As the nerves start to wake up, you may experience tingling as the movement and feeling returns to your extremity. If the numbness and inability to move your extremity has not gone away after 48 hours, please call your surgeon.   2. The extremity that is blocked will need to be protected until the numbness is gone and the strength has returned. Because you cannot feel it, you will need to take extra care to avoid injury. Because it may be weak, you may have difficulty moving it or using it. You may not know what position it is in without looking at it while the block is in effect.  3. For blocks in the legs and feet, returning to weight bearing and walking needs to be done carefully. You will need to wait until the numbness is entirely gone and the strength has returned. You should be able to move your leg and foot normally before you try and bear weight or walk. You will need someone to be with you when you first try to ensure you do not fall and possibly risk injury.  4. Bruising and tenderness at the needle site are common side effects and will resolve in a few days.  5. Persistent numbness or new problems with movement should be communicated to the surgeon or the [facility]  [phone] [facility] [phone]).

## 2024-02-01 NOTE — Brief Op Note (Signed)
 02/01/2024  11:06 AM  PATIENT:  [name] [name]  73 y.o. female  PRE-OPERATIVE DIAGNOSIS:  Right shoulder rotator cuff tear, impingement,   POST-OPERATIVE DIAGNOSIS:  Right shoulder rotator cuff tear, impingement, Biceps tear  PROCEDURE:  Procedure(s) with comments: ARTHROSCOPY, SHOULDER, WITH ROTATOR CUFF REPAIR (Right) TENODESIS, BICEPS (Right) - Right shoulder arthroscopy with rotator cuff repair. biceps tenodesis and subacromial decompression  SURGEON:  Surgeons and Role:    * Sharl Selinda Dover, MD - Primary  PHYSICIAN ASSISTANT: Dayle Moores, PA-C   ANESTHESIA:   regional and general  EBL:  5 mL   BLOOD ADMINISTERED:none  DRAINS: none   LOCAL MEDICATIONS USED:  NONE  SPECIMEN:  No Specimen  DISPOSITION OF SPECIMEN:  N/A  COUNTS:  YES  TOURNIQUET:  * No tourniquets in log *  DICTATION: .Note written in EPIC  PLAN OF CARE: Discharge to home after PACU  PATIENT DISPOSITION:  PACU - hemodynamically stable.   Delay start of Pharmacological VTE agent (>24hrs) due to surgical blood loss or risk of bleeding: not applicable

## 2024-02-02 NOTE — Anesthesia Postprocedure Evaluation (Signed)
 Anesthesia Post Note  Patient: SHERIDAN HEW  Procedure(s) Performed: ARTHROSCOPY, SHOULDER, WITH ROTATOR CUFF REPAIR (Right: Shoulder) TENODESIS, BICEPS (Right: Shoulder)     Patient location during evaluation: PACU Anesthesia Type: General and Regional Level of consciousness: awake and alert Pain management: pain level controlled Vital Signs Assessment: post-procedure vital signs reviewed and stable Respiratory status: spontaneous breathing, nonlabored ventilation, respiratory function stable and patient connected to nasal cannula oxygen Cardiovascular status: blood pressure returned to baseline and stable Postop Assessment: no apparent nausea or vomiting Anesthetic complications: no   No notable events documented.  Last Vitals:  Vitals:   02/01/24 1130 02/01/24 1207  BP: 136/71 (!) 143/61  Pulse: 64 66  Resp: 15 16  Temp:  36.6 C  SpO2: 95% 96%    Last Pain:  Vitals:   02/01/24 1207  TempSrc:   PainSc: 6                  Tishina Lown S

## 2024-02-04 ENCOUNTER — Encounter (HOSPITAL_BASED_OUTPATIENT_CLINIC_OR_DEPARTMENT_OTHER): Payer: Self-pay | Admitting: Orthopedic Surgery

## 2024-02-22 DIAGNOSIS — M25511 Pain in right shoulder: Secondary | ICD-10-CM | POA: Diagnosis not present

## 2024-02-22 DIAGNOSIS — M6281 Muscle weakness (generalized): Secondary | ICD-10-CM | POA: Diagnosis not present

## 2024-02-24 ENCOUNTER — Ambulatory Visit: Payer: Self-pay | Admitting: Internal Medicine

## 2024-02-24 NOTE — Progress Notes (Signed)
 EF in May lowe rside. Noted that you have seen cardiology

## 2024-02-25 ENCOUNTER — Other Ambulatory Visit: Payer: Self-pay | Admitting: Registered Nurse

## 2024-02-25 ENCOUNTER — Telehealth: Payer: Self-pay | Admitting: Registered Nurse

## 2024-02-25 DIAGNOSIS — G894 Chronic pain syndrome: Secondary | ICD-10-CM

## 2024-02-25 DIAGNOSIS — M5416 Radiculopathy, lumbar region: Secondary | ICD-10-CM

## 2024-02-25 DIAGNOSIS — M25511 Pain in right shoulder: Secondary | ICD-10-CM

## 2024-02-25 DIAGNOSIS — G8929 Other chronic pain: Secondary | ICD-10-CM

## 2024-02-25 MED ORDER — OXYCODONE HCL 15 MG PO TABS: 15.0000 mg | ORAL_TABLET | Freq: Every day | ORAL | 0 refills | Status: DC | PRN

## 2024-02-25 NOTE — Telephone Encounter (Signed)
 PMP was Reviewed  Oxycodone  e-scribed to pharmacy.  Call placed to Ms. Scarboro regarding the above, she verbalizes understanding.

## 2024-02-28 DIAGNOSIS — I10 Essential (primary) hypertension: Secondary | ICD-10-CM | POA: Diagnosis not present

## 2024-02-28 DIAGNOSIS — E1122 Type 2 diabetes mellitus with diabetic chronic kidney disease: Secondary | ICD-10-CM | POA: Diagnosis not present

## 2024-03-03 DIAGNOSIS — M25511 Pain in right shoulder: Secondary | ICD-10-CM | POA: Diagnosis not present

## 2024-03-03 DIAGNOSIS — M6281 Muscle weakness (generalized): Secondary | ICD-10-CM | POA: Diagnosis not present

## 2024-03-06 DIAGNOSIS — M6281 Muscle weakness (generalized): Secondary | ICD-10-CM | POA: Diagnosis not present

## 2024-03-06 DIAGNOSIS — M25511 Pain in right shoulder: Secondary | ICD-10-CM | POA: Diagnosis not present

## 2024-03-11 DIAGNOSIS — M25511 Pain in right shoulder: Secondary | ICD-10-CM | POA: Diagnosis not present

## 2024-03-11 DIAGNOSIS — M6281 Muscle weakness (generalized): Secondary | ICD-10-CM | POA: Diagnosis not present

## 2024-03-12 ENCOUNTER — Other Ambulatory Visit: Payer: Self-pay | Admitting: Internal Medicine

## 2024-03-12 DIAGNOSIS — Z1231 Encounter for screening mammogram for malignant neoplasm of breast: Secondary | ICD-10-CM

## 2024-03-13 ENCOUNTER — Ambulatory Visit: Admitting: Internal Medicine

## 2024-03-13 DIAGNOSIS — M6281 Muscle weakness (generalized): Secondary | ICD-10-CM | POA: Diagnosis not present

## 2024-03-13 DIAGNOSIS — M25511 Pain in right shoulder: Secondary | ICD-10-CM | POA: Diagnosis not present

## 2024-03-17 DIAGNOSIS — M25511 Pain in right shoulder: Secondary | ICD-10-CM | POA: Diagnosis not present

## 2024-03-17 DIAGNOSIS — M6281 Muscle weakness (generalized): Secondary | ICD-10-CM | POA: Diagnosis not present

## 2024-03-18 DIAGNOSIS — E7849 Other hyperlipidemia: Secondary | ICD-10-CM | POA: Diagnosis not present

## 2024-03-18 DIAGNOSIS — E1122 Type 2 diabetes mellitus with diabetic chronic kidney disease: Secondary | ICD-10-CM | POA: Diagnosis not present

## 2024-03-18 DIAGNOSIS — K7581 Nonalcoholic steatohepatitis (NASH): Secondary | ICD-10-CM | POA: Diagnosis not present

## 2024-03-18 DIAGNOSIS — M818 Other osteoporosis without current pathological fracture: Secondary | ICD-10-CM | POA: Diagnosis not present

## 2024-03-18 DIAGNOSIS — I10 Essential (primary) hypertension: Secondary | ICD-10-CM | POA: Diagnosis not present

## 2024-03-18 DIAGNOSIS — L570 Actinic keratosis: Secondary | ICD-10-CM | POA: Diagnosis not present

## 2024-03-18 DIAGNOSIS — N182 Chronic kidney disease, stage 2 (mild): Secondary | ICD-10-CM | POA: Diagnosis not present

## 2024-03-18 DIAGNOSIS — H029 Unspecified disorder of eyelid: Secondary | ICD-10-CM | POA: Diagnosis not present

## 2024-03-24 DIAGNOSIS — M25511 Pain in right shoulder: Secondary | ICD-10-CM | POA: Diagnosis not present

## 2024-03-24 DIAGNOSIS — M6281 Muscle weakness (generalized): Secondary | ICD-10-CM | POA: Diagnosis not present

## 2024-03-27 DIAGNOSIS — M6281 Muscle weakness (generalized): Secondary | ICD-10-CM | POA: Diagnosis not present

## 2024-03-27 DIAGNOSIS — M25511 Pain in right shoulder: Secondary | ICD-10-CM | POA: Diagnosis not present

## 2024-03-31 ENCOUNTER — Encounter: Attending: Physical Medicine & Rehabilitation | Admitting: Registered Nurse

## 2024-03-31 VITALS — BP 128/84 | HR 92 | Ht 65.0 in | Wt 162.0 lb

## 2024-03-31 DIAGNOSIS — G039 Meningitis, unspecified: Secondary | ICD-10-CM | POA: Diagnosis not present

## 2024-03-31 DIAGNOSIS — G894 Chronic pain syndrome: Secondary | ICD-10-CM | POA: Insufficient documentation

## 2024-03-31 DIAGNOSIS — M5416 Radiculopathy, lumbar region: Secondary | ICD-10-CM | POA: Insufficient documentation

## 2024-03-31 DIAGNOSIS — Z79891 Long term (current) use of opiate analgesic: Secondary | ICD-10-CM | POA: Insufficient documentation

## 2024-03-31 DIAGNOSIS — M542 Cervicalgia: Secondary | ICD-10-CM | POA: Insufficient documentation

## 2024-03-31 DIAGNOSIS — M25511 Pain in right shoulder: Secondary | ICD-10-CM | POA: Insufficient documentation

## 2024-03-31 DIAGNOSIS — Z5181 Encounter for therapeutic drug level monitoring: Secondary | ICD-10-CM | POA: Diagnosis not present

## 2024-03-31 DIAGNOSIS — M961 Postlaminectomy syndrome, not elsewhere classified: Secondary | ICD-10-CM | POA: Diagnosis not present

## 2024-03-31 DIAGNOSIS — G8929 Other chronic pain: Secondary | ICD-10-CM | POA: Insufficient documentation

## 2024-03-31 DIAGNOSIS — M5412 Radiculopathy, cervical region: Secondary | ICD-10-CM | POA: Insufficient documentation

## 2024-03-31 DIAGNOSIS — M546 Pain in thoracic spine: Secondary | ICD-10-CM | POA: Diagnosis not present

## 2024-03-31 MED ORDER — NORTRIPTYLINE HCL 25 MG PO CAPS: 25.0000 mg | ORAL_CAPSULE | Freq: Every day | ORAL | 1 refills | Status: AC

## 2024-03-31 MED ORDER — OXYCODONE HCL 15 MG PO TABS
15.0000 mg | ORAL_TABLET | Freq: Every day | ORAL | 0 refills | Status: DC | PRN
Start: 2024-03-31 — End: 2024-04-24

## 2024-03-31 MED ORDER — TOPIRAMATE 25 MG PO TABS: 25.0000 mg | ORAL_TABLET | Freq: Two times a day (BID) | ORAL | 3 refills | Status: AC

## 2024-03-31 MED ORDER — GABAPENTIN 800 MG PO TABS
800.0000 mg | ORAL_TABLET | Freq: Three times a day (TID) | ORAL | 1 refills | Status: AC
Start: 2024-03-31 — End: ?

## 2024-03-31 NOTE — Progress Notes (Signed)
 Subjective:    Patient ID: [name] [name], female    DOB: 22-May-1951, 73 y.o.   MRN: [id number]  HPI: [name] [name] is a 73 y.o. female who returns for follow up appointment for chronic pain and medication refill. She states her pain is located in  her neck radiating into her right shoulder, upper- lower back radiating into her bilateral lower extremities. She rates her pain 8. Her current exercise regime is attending physical therapy two days a week, walking and performing stretching exercises.  [name] [name] Morphine  equivalent is 112.50 MME.   Oral Swab was Performed today.     Pain Inventory Average Pain 7 Pain Right Now 8 My pain is sharp, dull, stabbing, tingling, and aching  In the last 24 hours, has pain interfered with the following? General activity 7 Relation with others 7 Enjoyment of life 7 What TIME of day is your pain at its worst? morning , daytime, evening, and night Sleep (in general) Fair  Pain is worse with: walking, bending, sitting, inactivity, standing, and some activites Pain improves with: rest, heat/ice, therapy/exercise, pacing activities, and medication Relief from Meds: 7  Family History  Problem Relation Age of Onset   Hypertension Mother    Heart disease Mother        Mitral valve replacement.    Diabetes Mother    Liver cancer Father 78       HCC   Diabetes Brother    Lung cancer Brother 41       mets   Melanoma Brother 42       neck and back   Cancer Paternal Uncle        unknown type; dx after 78   Breast cancer Paternal Grandmother        dx 25s   Ovarian cancer Cousin        paternal cousin; d. 30s   Cancer Cousin        GYN; d. 34s   Colon cancer Neg Hx    Colon polyps Neg Hx    Esophageal cancer Neg Hx    Rectal cancer Neg Hx    Stomach cancer Neg Hx    Social History   Socioeconomic History   Marital status: Legally Separated    Spouse name: Not on file   Number of children: Not on file   Years of education: Not on file    Highest education level: Not on file  Occupational History   Occupation: disable    Employer: DISABLED  Tobacco Use   Smoking status: Never   Smokeless tobacco: Never  Vaping Use   Vaping status: Never Used  Substance and Sexual Activity   Alcohol  use: No   Drug use: No   Sexual activity: Not on file    Comment: second hand smoke  Other Topics Concern   Not on file  Social History Narrative   Has one child lives at home with her.    Social Drivers of Corporate investment banker Strain: Not on file  Food Insecurity: Not on file  Transportation Needs: Not on file  Physical Activity: Not on file  Stress: Not on file  Social Connections: Not on file   Past Surgical History:  Procedure Laterality Date   ABDOMINAL HYSTERECTOMY  1987   W/ BSO   ANTERIOR CERVICAL DECOMP/DISCECTOMY FUSION N/A 08/09/2012   Procedure: ANTERIOR CERVICAL DECOMPRESSION/DISCECTOMY FUSION 1 LEVEL;  Surgeon: Catalina CHRISTELLA Stains, MD;  Location: [facility];  Service:  Neurosurgery;  Laterality: N/A;  Cervical four-five  Anterior cervical decompression/diskectomy/fusion   ANTERIOR FUSION CERVICAL SPINE  2007   C5 -6   APPENDECTOMY     APPLICATION OF INTRAOPERATIVE CT SCAN N/A 05/17/2017   Procedure: APPLICATION OF INTRAOPERATIVE CT SCAN;  Surgeon: Unice Pac, MD;  Location: [facility] OR;  Service: Neurosurgery;  Laterality: N/A;   BACK SURGERY     x 5   BICEPT TENODESIS Right 02/01/2024   Procedure: TENODESIS, BICEPS;  Surgeon: Sharl Selinda Dover, MD;  Location: [facility];  Service: Orthopedics;  Laterality: Right;  Right shoulder arthroscopy with rotator cuff repair. biceps tenodesis and subacromial decompression   BREAST EXCISIONAL BIOPSY Bilateral    No scar seen    BREAST SURGERY     x 2 biopsies   CARPAL TUNNEL RELEASE  RIGHT - 2001  & DEC 2011 W/ BACK SURG.   CERVICAL DISC SURGERY  2005   C5 - 6   CHOLECYSTECTOMY  1994   COLONOSCOPY WITH PROPOFOL  N/A 05/16/2022   Procedure:  COLONOSCOPY WITH PROPOFOL ;  Surgeon: Eartha Angelia Sieving, MD;  [facility]: [facility];  Service: Gastroenterology;  Laterality: N/A;  945 ASA 3   DORSAL COMPARTMENT RELEASE Right 04/07/2013   Procedure: RIGHT WRIST STS RELEASE;  Surgeon: Donnice DELENA Robinsons, MD;  Location: [facility];  Service: Orthopedics;  Laterality: Right;   ENDOVENOUS ABLATION SAPHENOUS VEIN W/ LASER Right 05/29/2019   endovenous laser ablation right greater saphenous vein by Medford Blade MD    EXPLORATION OF INCISION FOR CSF LEAK  DEC 2011  X2   POST LAMINECOTMY   FINGER ARTHRODESIS Right 04/07/2013   Procedure: RIGHT INDEX AND RIGHT LONG DISTAL INTERPHALANGEAL JOINT FUSIONS;  Surgeon: Donnice DELENA Robinsons, MD;  Location: [facility];  Service: Orthopedics;  Laterality: Right;   FINGER ARTHROPLASTY Right 04/07/2013   Procedure: RIGHT THUMB CMC ARTHROPLASTY;  Surgeon: Donnice DELENA Robinsons, MD;  Location: [facility];  Service: Orthopedics;  Laterality: Right;   GANGLION CYST EXCISION Right 04/07/2013   Procedure: RIGHT WRIST MASS EXCISION;  Surgeon: Donnice DELENA Robinsons, MD;  Location: [facility];  Service: Orthopedics;  Laterality: Right;   KNEE ARTHROSCOPY  LEFT X3 (LAST ONE 2005)   KNEE ARTHROSCOPY  05/17/2011   Procedure: ARTHROSCOPY KNEE;  Surgeon: Norleen LITTIE Hoard III;  Location: [facility];  Service: Orthopedics;  Laterality: Right;  WITH MEDIAL MENISECTOMY AND removal of suprapatella fat lump   LEFT WRIST TENOSYNECTOMY W/ LEFT THUMB JOINT REPAIR  02-24-10   LUMBAR FUSION  11-30-10   L4 - 5   LUMBAR FUSION  2000   L2 - 4   LUMBAR LAMINECTOMY  DEC 2011   L4 - 5   POSTERIOR CERVICAL FUSION/FORAMINOTOMY N/A 05/17/2017   Procedure: Cervical five  to Thorastic one  Posterior cervical fusion with lateral mass screws/revision of prior instrumentation;  Surgeon: Unice Pac, MD;  Location: [facility] OR;  Service: Neurosurgery;  Laterality: N/A;  C5 to T1  Posterior cervical fusion with lateral mass screws/revision of prior instrumentation   SHOULDER ARTHROSCOPY WITH ROTATOR CUFF REPAIR Right 02/01/2024   Procedure: ARTHROSCOPY, SHOULDER, WITH ROTATOR CUFF REPAIR;  Surgeon: Sharl Selinda Dover, MD;  Location: [facility];  Service: Orthopedics;  Laterality: Right;   TENDON REPAIR  JAN 2010   LEFT INDEX AND LONG FINGERS   THUMB SURGERY   04/07/2019   RIGHT THUMB   TUBAL LIGATION     Past Surgical  History:  Procedure Laterality Date   ABDOMINAL HYSTERECTOMY  1987   W/ BSO   ANTERIOR CERVICAL DECOMP/DISCECTOMY FUSION N/A 08/09/2012   Procedure: ANTERIOR CERVICAL DECOMPRESSION/DISCECTOMY FUSION 1 LEVEL;  Surgeon: Catalina CHRISTELLA Stains, MD;  Location: [facility];  Service: Neurosurgery;  Laterality: N/A;  Cervical four-five  Anterior cervical decompression/diskectomy/fusion   ANTERIOR FUSION CERVICAL SPINE  2007   C5 -6   APPENDECTOMY     APPLICATION OF INTRAOPERATIVE CT SCAN N/A 05/17/2017   Procedure: APPLICATION OF INTRAOPERATIVE CT SCAN;  Surgeon: Unice Pac, MD;  Location: [facility] OR;  Service: Neurosurgery;  Laterality: N/A;   BACK SURGERY     x 5   BICEPT TENODESIS Right 02/01/2024   Procedure: TENODESIS, BICEPS;  Surgeon: Sharl Selinda Dover, MD;  Location: [facility];  Service: Orthopedics;  Laterality: Right;  Right shoulder arthroscopy with rotator cuff repair. biceps tenodesis and subacromial decompression   BREAST EXCISIONAL BIOPSY Bilateral    No scar seen    BREAST SURGERY     x 2 biopsies   CARPAL TUNNEL RELEASE  RIGHT - 2001  & DEC 2011 W/ BACK SURG.   CERVICAL DISC SURGERY  2005   C5 - 6   CHOLECYSTECTOMY  1994   COLONOSCOPY WITH PROPOFOL  N/A 05/16/2022   Procedure: COLONOSCOPY WITH PROPOFOL ;  Surgeon: Eartha Angelia Sieving, MD;  [facility];  Service: Gastroenterology;  Laterality: N/A;  945 ASA 3   DORSAL COMPARTMENT RELEASE Right 04/07/2013   Procedure: RIGHT WRIST STS RELEASE;   Surgeon: Donnice DELENA Robinsons, MD;  Location: [facility];  Service: Orthopedics;  Laterality: Right;   ENDOVENOUS ABLATION SAPHENOUS VEIN W/ LASER Right 05/29/2019   endovenous laser ablation right greater saphenous vein by Medford Blade MD    EXPLORATION OF INCISION FOR CSF LEAK  DEC 2011  X2   POST LAMINECOTMY   FINGER ARTHRODESIS Right 04/07/2013   Procedure: RIGHT INDEX AND RIGHT LONG DISTAL INTERPHALANGEAL JOINT FUSIONS;  Surgeon: Donnice DELENA Robinsons, MD;  Location: [facility];  Service: Orthopedics;  Laterality: Right;   FINGER ARTHROPLASTY Right 04/07/2013   Procedure: RIGHT THUMB CMC ARTHROPLASTY;  Surgeon: Donnice DELENA Robinsons, MD;  Location: [facility];  Service: Orthopedics;  Laterality: Right;   GANGLION CYST EXCISION Right 04/07/2013   Procedure: RIGHT WRIST MASS EXCISION;  Surgeon: Donnice DELENA Robinsons, MD;  Location: [facility];  Service: Orthopedics;  Laterality: Right;   KNEE ARTHROSCOPY  LEFT X3 (LAST ONE 2005)   KNEE ARTHROSCOPY  05/17/2011   Procedure: ARTHROSCOPY KNEE;  Surgeon: Norleen LITTIE Hoard III;  Location: [facility];  Service: Orthopedics;  Laterality: Right;  WITH MEDIAL MENISECTOMY AND removal of suprapatella fat lump   LEFT WRIST TENOSYNECTOMY W/ LEFT THUMB JOINT REPAIR  02-24-10   LUMBAR FUSION  11-30-10   L4 - 5   LUMBAR FUSION  2000   L2 - 4   LUMBAR LAMINECTOMY  DEC 2011   L4 - 5   POSTERIOR CERVICAL FUSION/FORAMINOTOMY N/A 05/17/2017   Procedure: Cervical five  to Thorastic one  Posterior cervical fusion with lateral mass screws/revision of prior instrumentation;  Surgeon: Unice Pac, MD;  Location: [facility] OR;  Service: Neurosurgery;  Laterality: N/A;  C5 to T1 Posterior cervical fusion with lateral mass screws/revision of prior instrumentation   SHOULDER ARTHROSCOPY WITH ROTATOR CUFF REPAIR Right 02/01/2024   Procedure: ARTHROSCOPY, SHOULDER, WITH ROTATOR CUFF REPAIR;  Surgeon: Sharl Selinda Dover, MD;  Location:  [facility];  Service: Orthopedics;  Laterality: Right;   TENDON REPAIR  JAN 2010   LEFT INDEX AND LONG FINGERS   THUMB SURGERY   04/07/2019   RIGHT THUMB   TUBAL LIGATION     Past Medical History:  Diagnosis Date   Acute sinusitis, unspecified    Allergy     Anemia    quite a few times   Anxiety state, unspecified    Arachnoiditis BILATERAL LEGS   DUE TO MULTIPLE BACK SURG.'S   Arthritis    Asthma    last flare up was 03/2017 lasted over a month   Blood transfusion    Cancer (HCC)    skin cancers (in scalp)   Cardiomyopathy HX --06/2010   EF was 25% during acute illness (PHELONEPHRITIS) Repeat echo 12-06-10 60% showed normal EF.    Chronic back pain greater than 3 months duration    S/P BACK SURG'S   CSF leak    Diabetes mellitus without complication (HCC)    dx 2013 type 2   Dyslipidemia    Dysphagia    some post op cerv fusion 2/14   Dysrhythmia    Essential hypertension, benign    Family history of adverse reaction to anesthesia    mother gets n/v   Family history of breast cancer 02/06/2022   Family history of liver cancer 02/06/2022   Family history of lung cancer 02/06/2022   Family history of ovarian cancer 02/06/2022   GERD (gastroesophageal reflux disease) AND HIATIAL HERNIA   CONTROLLED W/ NEXIUM   Headache(784.0)    History of chronic bronchitis    Hx of bladder infections    Hyperlipidemia    TAKE CHLOSTEROL MEDICATION,04/23/19   Hypertension    Neuromuscular disorder (HCC)    numbness and tingling   Osteoporosis    Other malaise and fatigue    PONV (postoperative nausea and vomiting)    Shortness of breath    Spinal headache    Spinal stenosis, cervical region    Varicosities    venous   Weakness of both legs DUE TO ARACHNOIDITIS   OCCASIONAL USES CANE   BP 128/84   Pulse 92   Ht 5' 5 (1.651 m)   Wt 162 lb (73.5 kg)   SpO2 98%   BMI 26.96 kg/m   Opioid Risk Score:   Fall Risk Score:   `1  Depression screen PHQ 2/9     01/28/2024    1:01 PM 11/29/2023    1:30 PM 10/30/2023    1:38 PM 09/25/2023    1:54 PM 08/31/2023    1:44 PM 07/05/2023    2:51 PM 05/04/2023    1:27 PM  Depression screen PHQ 2/9  Decreased Interest 0 0 0 0 1 0 1  Down, Depressed, Hopeless 0 0 0 0 1 0 1  PHQ - 2 Score 0 0 0 0 2 0 2     Review of Systems     Objective:   Physical Exam Vitals and nursing note reviewed.  Constitutional:      Appearance: Normal appearance.  Neck:     Comments: Cervical Paraspinal Tenderness: C-5-C-6 Cardiovascular:     Rate and Rhythm: Regular rhythm.     Pulses: Normal pulses.     Heart sounds: Normal heart sounds.  Pulmonary:     Effort: Pulmonary effort is normal.     Breath sounds: Normal breath sounds.  Musculoskeletal:     Comments: Normal Muscle Bulk and Muscle Testing  Reveals:  Upper Extremities:Right: Decreased  ROM 30 Degrees and Muscle Strength 5/5 Right AC Joint Tenderness Wearing Right Upper Extremity Sling  Left Upper Extremity: Full ROM and  Muscle Strength 5/5  Thoracic and Lumbar Hypersensitivity Right Greater Trochanter Tenderness Lower Extremities: Full ROM and Muscle Strength 5/5 Arises from chair slowly Narrow Based  Gait     Skin:    General: Skin is warm and dry.  Neurological:     Mental Status: She is alert and oriented to person, place, and time.          Assessment & Plan:  1.  On 05/17/2017 :C5C6C7 T1 Posterior Cervical Fusion with lateral mass fixation with AIRO Imaging, revision of prior instrumentation. By Dr. Unice.  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 Continue current medication regimen with  Gabapentin  800 mg TID. Refilled: Oxycodone  15mg  one tablet 5 times a day  as needed for pain # 150 tablets We will continue the opioid monitoring program, this consists of regular clinic visits, examinations, urine drug screen, pill counts as well as use of [place]  Controlled  Substance Reporting system. 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 Continue to Monitor. 03/31/2024 3. Anxiety/depression: PCP Following. Continue to monitor. 03/31/2024. 4. Muscle Spasms: Continue current medication regime with Flexeril . Continue to Monitor. 03/31/2024 5. Cervicalgia/ Cervical Radiculitis: Dr Unice Following. ,Continue current medication regime with Gabapentin :  S/P  C5C6C7 T1 Posterior Cervical Fusion with lateral mass fixation with AIRO Imaging, revision of prior instrumentation by Dr. Unice on 05/17/2017. 03/31/2024 7. Bilateral Thoracic Back Pain: Continue HEP as Tolerated and Continue current medication regimen. Continue to Monitor. 03/31/2024  8. Left lower extremity DVT/ Phlebitis:  S/P endovenous laser ablation of left great saphenous vein on 05/09/2018 by Dr. Eliza:  Vascular  Following. 03/31/2024. 9. Insomnia: Continue Pamelor . Continue to Monitor. 03/31/2024. 10.Right Shoulder Pain / Right Shoulder Tendonitis: [facility] Following.S/P: on 02/01/2024  ARTHROSCOPY, SHOULDER, WITH ROTATOR CUFF REPAIR Right Choice  TENODESIS, BICEPS      Continue to monitor.  03/31/2024.   F/U in 2 months

## 2024-04-01 DIAGNOSIS — M25511 Pain in right shoulder: Secondary | ICD-10-CM | POA: Diagnosis not present

## 2024-04-01 DIAGNOSIS — M6281 Muscle weakness (generalized): Secondary | ICD-10-CM | POA: Diagnosis not present

## 2024-04-02 ENCOUNTER — Encounter (INDEPENDENT_AMBULATORY_CARE_PROVIDER_SITE_OTHER): Payer: Self-pay | Admitting: Gastroenterology

## 2024-04-03 DIAGNOSIS — M25511 Pain in right shoulder: Secondary | ICD-10-CM | POA: Diagnosis not present

## 2024-04-03 DIAGNOSIS — M6281 Muscle weakness (generalized): Secondary | ICD-10-CM | POA: Diagnosis not present

## 2024-04-04 LAB — DRUG TOX MONITOR 1 W/CONF, ORAL FLD
Amphetamines: NEGATIVE ng/mL (ref <10)
Barbiturates: NEGATIVE ng/mL (ref <10)
Benzodiazepines: NEGATIVE ng/mL (ref <0.50)
Buprenorphine: NEGATIVE ng/mL (ref <0.10)
Cocaine: NEGATIVE ng/mL (ref <5.0)
Codeine: NEGATIVE ng/mL (ref <2.5)
Dihydrocodeine: NEGATIVE ng/mL (ref <2.5)
Fentanyl: NEGATIVE ng/mL (ref <0.10)
Heroin Metabolite: NEGATIVE ng/mL (ref <1.0)
Hydrocodone: NEGATIVE ng/mL (ref <2.5)
Hydromorphone: NEGATIVE ng/mL (ref <2.5)
MARIJUANA: NEGATIVE ng/mL (ref <2.5)
MDMA: NEGATIVE ng/mL (ref <10)
Meprobamate: NEGATIVE ng/mL (ref <2.5)
Methadone: NEGATIVE ng/mL (ref <5.0)
Morphine: NEGATIVE ng/mL (ref <2.5)
Nicotine Metabolite: NEGATIVE ng/mL (ref <5.0)
Norhydrocodone: NEGATIVE ng/mL (ref <2.5)
Noroxycodone: 42.9 ng/mL — ABNORMAL HIGH (ref <2.5)
Opiates: POSITIVE ng/mL — AB (ref <2.5)
Oxycodone: >250 ng/mL — ABNORMAL HIGH (ref <2.5)
Oxymorphone: NEGATIVE ng/mL (ref <2.5)
Phencyclidine: NEGATIVE ng/mL (ref <10)
Tapentadol: NEGATIVE ng/mL (ref <5.0)
Tramadol: NEGATIVE ng/mL (ref <5.0)
Zolpidem: NEGATIVE ng/mL (ref <5.0)

## 2024-04-04 LAB — DRUG TOX ALC METAB W/CON, ORAL FLD: Alcohol Metabolite: NEGATIVE ng/mL (ref <25)

## 2024-04-07 DIAGNOSIS — M6281 Muscle weakness (generalized): Secondary | ICD-10-CM | POA: Diagnosis not present

## 2024-04-07 DIAGNOSIS — M25511 Pain in right shoulder: Secondary | ICD-10-CM | POA: Diagnosis not present

## 2024-04-10 DIAGNOSIS — M6281 Muscle weakness (generalized): Secondary | ICD-10-CM | POA: Diagnosis not present

## 2024-04-10 DIAGNOSIS — M25511 Pain in right shoulder: Secondary | ICD-10-CM | POA: Diagnosis not present

## 2024-04-14 DIAGNOSIS — M6281 Muscle weakness (generalized): Secondary | ICD-10-CM | POA: Diagnosis not present

## 2024-04-14 DIAGNOSIS — M25511 Pain in right shoulder: Secondary | ICD-10-CM | POA: Diagnosis not present

## 2024-04-17 DIAGNOSIS — M6281 Muscle weakness (generalized): Secondary | ICD-10-CM | POA: Diagnosis not present

## 2024-04-17 DIAGNOSIS — M25511 Pain in right shoulder: Secondary | ICD-10-CM | POA: Diagnosis not present

## 2024-04-18 DIAGNOSIS — K7581 Nonalcoholic steatohepatitis (NASH): Secondary | ICD-10-CM | POA: Diagnosis not present

## 2024-04-18 DIAGNOSIS — E1122 Type 2 diabetes mellitus with diabetic chronic kidney disease: Secondary | ICD-10-CM | POA: Diagnosis not present

## 2024-04-18 DIAGNOSIS — E7849 Other hyperlipidemia: Secondary | ICD-10-CM | POA: Diagnosis not present

## 2024-04-18 DIAGNOSIS — I10 Essential (primary) hypertension: Secondary | ICD-10-CM | POA: Diagnosis not present

## 2024-04-18 DIAGNOSIS — M818 Other osteoporosis without current pathological fracture: Secondary | ICD-10-CM | POA: Diagnosis not present

## 2024-04-18 DIAGNOSIS — N182 Chronic kidney disease, stage 2 (mild): Secondary | ICD-10-CM | POA: Diagnosis not present

## 2024-04-18 DIAGNOSIS — M79646 Pain in unspecified finger(s): Secondary | ICD-10-CM | POA: Diagnosis not present

## 2024-04-21 DIAGNOSIS — M25511 Pain in right shoulder: Secondary | ICD-10-CM | POA: Diagnosis not present

## 2024-04-21 DIAGNOSIS — M6281 Muscle weakness (generalized): Secondary | ICD-10-CM | POA: Diagnosis not present

## 2024-04-22 DIAGNOSIS — I10 Essential (primary) hypertension: Secondary | ICD-10-CM | POA: Diagnosis not present

## 2024-04-22 DIAGNOSIS — E1122 Type 2 diabetes mellitus with diabetic chronic kidney disease: Secondary | ICD-10-CM | POA: Diagnosis not present

## 2024-04-23 DIAGNOSIS — M6281 Muscle weakness (generalized): Secondary | ICD-10-CM | POA: Diagnosis not present

## 2024-04-23 DIAGNOSIS — M25511 Pain in right shoulder: Secondary | ICD-10-CM | POA: Diagnosis not present

## 2024-04-24 ENCOUNTER — Encounter: Attending: Physical Medicine & Rehabilitation | Admitting: Registered Nurse

## 2024-04-24 ENCOUNTER — Encounter: Payer: Self-pay | Admitting: Registered Nurse

## 2024-04-24 ENCOUNTER — Ambulatory Visit
Admission: RE | Admit: 2024-04-24 | Discharge: 2024-04-24 | Disposition: A | Source: Ambulatory Visit | Attending: Internal Medicine | Admitting: Internal Medicine

## 2024-04-24 VITALS — BP 129/88 | HR 89 | Ht 65.0 in | Wt 160.0 lb

## 2024-04-24 DIAGNOSIS — M25511 Pain in right shoulder: Secondary | ICD-10-CM | POA: Insufficient documentation

## 2024-04-24 DIAGNOSIS — G894 Chronic pain syndrome: Secondary | ICD-10-CM | POA: Diagnosis not present

## 2024-04-24 DIAGNOSIS — Z1231 Encounter for screening mammogram for malignant neoplasm of breast: Secondary | ICD-10-CM

## 2024-04-24 DIAGNOSIS — Z79891 Long term (current) use of opiate analgesic: Secondary | ICD-10-CM | POA: Diagnosis not present

## 2024-04-24 DIAGNOSIS — M5412 Radiculopathy, cervical region: Secondary | ICD-10-CM | POA: Diagnosis not present

## 2024-04-24 DIAGNOSIS — G8929 Other chronic pain: Secondary | ICD-10-CM | POA: Insufficient documentation

## 2024-04-24 DIAGNOSIS — M542 Cervicalgia: Secondary | ICD-10-CM | POA: Diagnosis not present

## 2024-04-24 DIAGNOSIS — M5416 Radiculopathy, lumbar region: Secondary | ICD-10-CM | POA: Diagnosis not present

## 2024-04-24 DIAGNOSIS — Z5181 Encounter for therapeutic drug level monitoring: Secondary | ICD-10-CM | POA: Diagnosis not present

## 2024-04-24 DIAGNOSIS — M546 Pain in thoracic spine: Secondary | ICD-10-CM | POA: Insufficient documentation

## 2024-04-24 MED ORDER — OXYCODONE HCL 15 MG PO TABS: 15.0000 mg | ORAL_TABLET | Freq: Every day | ORAL | 0 refills | Status: DC | PRN

## 2024-04-24 NOTE — Progress Notes (Signed)
 Subjective:    Patient ID: [name] [name], female    DOB: Sep 12, 1950, 73 y.o.   MRN: [id number]  HPI: [name] [name] is a 73 y.o. female who returns for follow up appointment for chronic pain and medication refill. She states her pain is located in her neck radiating into her right shoulder, upper- lower back radiating into her bilateral lower extremities. She  rates her pain 7. Her current exercise regime is walking and performing stretching exercises. She is attendig physical therapy two days a week  [name] Morphine  equivalent is 112.50 MME.   Last Oral Swab 03/31/2024, it was consistent.    Pain Inventory Average Pain 7 Pain Right Now 7 My pain is sharp, burning, stabbing, tingling, and aching  In the last 24 hours, has pain interfered with the following? General activity 7 Relation with others 7 Enjoyment of life 7 What TIME of day is your pain at its worst? morning , daytime, evening, and night Sleep (in general) Fair  Pain is worse with: walking, bending, sitting, standing, and some activites Pain improves with: rest, heat/ice, pacing activities, and medication Relief from Meds: 7  Family History  Problem Relation Age of Onset   Hypertension Mother    Heart disease Mother        Mitral valve replacement.    Diabetes Mother    Liver cancer Father 64       HCC   Diabetes Brother    Lung cancer Brother 46       mets   Melanoma Brother 12       neck and back   Cancer Paternal Uncle        unknown type; dx after 44   Breast cancer Paternal Grandmother        dx 71s   Ovarian cancer Cousin        paternal cousin; d. 30s   Cancer Cousin        GYN; d. 20s   Colon cancer Neg Hx    Colon polyps Neg Hx    Esophageal cancer Neg Hx    Rectal cancer Neg Hx    Stomach cancer Neg Hx    Social History   Socioeconomic History   Marital status: Legally Separated    Spouse name: Not on file   Number of children: Not on file   Years of education: Not on file   Highest  education level: Not on file  Occupational History   Occupation: disable    Employer: DISABLED  Tobacco Use   Smoking status: Never   Smokeless tobacco: Never  Vaping Use   Vaping status: Never Used  Substance and Sexual Activity   Alcohol  use: No   Drug use: No   Sexual activity: Not on file    Comment: second hand smoke  Other Topics Concern   Not on file  Social History Narrative   Has one child lives at home with her.    Social Drivers of Corporate Investment Banker Strain: Not on file  Food Insecurity: Not on file  Transportation Needs: Not on file  Physical Activity: Not on file  Stress: Not on file  Social Connections: Not on file   Past Surgical History:  Procedure Laterality Date   ABDOMINAL HYSTERECTOMY  1987   W/ BSO   ANTERIOR CERVICAL DECOMP/DISCECTOMY FUSION N/A 08/09/2012   Procedure: ANTERIOR CERVICAL DECOMPRESSION/DISCECTOMY FUSION 1 LEVEL;  Surgeon: Catalina CHRISTELLA Stains, MD;  Location: [facility];  Service: Neurosurgery;  Laterality: N/A;  Cervical four-five  Anterior cervical decompression/diskectomy/fusion   ANTERIOR FUSION CERVICAL SPINE  2007   C5 -6   APPENDECTOMY     APPLICATION OF INTRAOPERATIVE CT SCAN N/A 05/17/2017   Procedure: APPLICATION OF INTRAOPERATIVE CT SCAN;  Surgeon: Unice Pac, MD;  [facility];  Service: Neurosurgery;  Laterality: N/A;   BACK SURGERY     x 5   BICEPT TENODESIS Right 02/01/2024   Procedure: TENODESIS, BICEPS;  Surgeon: Sharl Selinda Dover, MD;  Location: [facility];  Service: Orthopedics;  Laterality: Right;  Right shoulder arthroscopy with rotator cuff repair. biceps tenodesis and subacromial decompression   BREAST EXCISIONAL BIOPSY Bilateral    No scar seen    BREAST SURGERY     x 2 biopsies   CARPAL TUNNEL RELEASE  RIGHT - 2001  & DEC 2011 W/ BACK SURG.   CERVICAL DISC SURGERY  2005   C5 - 6   CHOLECYSTECTOMY  1994   COLONOSCOPY WITH PROPOFOL  N/A 05/16/2022   Procedure: COLONOSCOPY WITH  PROPOFOL ;  Surgeon: Eartha Angelia Sieving, MD;  [facility];  Service: Gastroenterology;  Laterality: N/A;  945 ASA 3   DORSAL COMPARTMENT RELEASE Right 04/07/2013   Procedure: RIGHT WRIST STS RELEASE;  Surgeon: Donnice DELENA Robinsons, MD;  Location: [facility];  Service: Orthopedics;  Laterality: Right;   ENDOVENOUS ABLATION SAPHENOUS VEIN W/ LASER Right 05/29/2019   endovenous laser ablation right greater saphenous vein by Medford Blade MD    EXPLORATION OF INCISION FOR CSF LEAK  DEC 2011  X2   POST LAMINECOTMY   FINGER ARTHRODESIS Right 04/07/2013   Procedure: RIGHT INDEX AND RIGHT LONG DISTAL INTERPHALANGEAL JOINT FUSIONS;  Surgeon: Donnice DELENA Robinsons, MD;  Location: [facility];  Service: Orthopedics;  Laterality: Right;   FINGER ARTHROPLASTY Right 04/07/2013   Procedure: RIGHT THUMB CMC ARTHROPLASTY;  Surgeon: Donnice DELENA Robinsons, MD;  Location: [facility];  Service: Orthopedics;  Laterality: Right;   GANGLION CYST EXCISION Right 04/07/2013   Procedure: RIGHT WRIST MASS EXCISION;  Surgeon: Donnice DELENA Robinsons, MD;  Location: [facility];  Service: Orthopedics;  Laterality: Right;   KNEE ARTHROSCOPY  LEFT X3 (LAST ONE 2005)   KNEE ARTHROSCOPY  05/17/2011   Procedure: ARTHROSCOPY KNEE;  Surgeon: Norleen LITTIE Hoard III;  Location: [facility];  Service: Orthopedics;  Laterality: Right;  WITH MEDIAL MENISECTOMY AND removal of suprapatella fat lump   LEFT WRIST TENOSYNECTOMY W/ LEFT THUMB JOINT REPAIR  02-24-10   LUMBAR FUSION  11-30-10   L4 - 5   LUMBAR FUSION  2000   L2 - 4   LUMBAR LAMINECTOMY  DEC 2011   L4 - 5   POSTERIOR CERVICAL FUSION/FORAMINOTOMY N/A 05/17/2017   Procedure: Cervical five  to Thorastic one  Posterior cervical fusion with lateral mass screws/revision of prior instrumentation;  Surgeon: Unice Pac, MD;  Location: [facility];  Service: Neurosurgery;  Laterality: N/A;  C5 to T1 Posterior  cervical fusion with lateral mass screws/revision of prior instrumentation   SHOULDER ARTHROSCOPY WITH ROTATOR CUFF REPAIR Right 02/01/2024   Procedure: ARTHROSCOPY, SHOULDER, WITH ROTATOR CUFF REPAIR;  Surgeon: Sharl Selinda Dover, MD;  Location: [facility];  Service: Orthopedics;  Laterality: Right;   TENDON REPAIR  JAN 2010   LEFT INDEX AND LONG FINGERS   THUMB SURGERY   04/07/2019   RIGHT THUMB   TUBAL LIGATION     Past  Surgical History:  Procedure Laterality Date   ABDOMINAL HYSTERECTOMY  1987   W/ BSO   ANTERIOR CERVICAL DECOMP/DISCECTOMY FUSION N/A 08/09/2012   Procedure: ANTERIOR CERVICAL DECOMPRESSION/DISCECTOMY FUSION 1 LEVEL;  Surgeon: Catalina CHRISTELLA Stains, MD;  Location: [facility];  Service: Neurosurgery;  Laterality: N/A;  Cervical four-five  Anterior cervical decompression/diskectomy/fusion   ANTERIOR FUSION CERVICAL SPINE  2007   C5 -6   APPENDECTOMY     APPLICATION OF INTRAOPERATIVE CT SCAN N/A 05/17/2017   Procedure: APPLICATION OF INTRAOPERATIVE CT SCAN;  Surgeon: Unice Pac, MD;  Location: [facility];  Service: Neurosurgery;  Laterality: N/A;   BACK SURGERY     x 5   BICEPT TENODESIS Right 02/01/2024   Procedure: TENODESIS, BICEPS;  Surgeon: Sharl Selinda Dover, MD;  Location: [facility];  Service: Orthopedics;  Laterality: Right;  Right shoulder arthroscopy with rotator cuff repair. biceps tenodesis and subacromial decompression   BREAST EXCISIONAL BIOPSY Bilateral    No scar seen    BREAST SURGERY     x 2 biopsies   CARPAL TUNNEL RELEASE  RIGHT - 2001  & DEC 2011 W/ BACK SURG.   CERVICAL DISC SURGERY  2005   C5 - 6   CHOLECYSTECTOMY  1994   COLONOSCOPY WITH PROPOFOL  N/A 05/16/2022   Procedure: COLONOSCOPY WITH PROPOFOL ;  Surgeon: Eartha Angelia Sieving, MD;  [facility];  Service: Gastroenterology;  Laterality: N/A;  945 ASA 3   DORSAL COMPARTMENT RELEASE Right 04/07/2013   Procedure: RIGHT WRIST STS RELEASE;  Surgeon:  Donnice DELENA Robinsons, MD;  Location: [facility];  Service: Orthopedics;  Laterality: Right;   ENDOVENOUS ABLATION SAPHENOUS VEIN W/ LASER Right 05/29/2019   endovenous laser ablation right greater saphenous vein by Medford Blade MD    EXPLORATION OF INCISION FOR CSF LEAK  DEC 2011  X2   POST LAMINECOTMY   FINGER ARTHRODESIS Right 04/07/2013   Procedure: RIGHT INDEX AND RIGHT LONG DISTAL INTERPHALANGEAL JOINT FUSIONS;  Surgeon: Donnice DELENA Robinsons, MD;  Location: [facility];  Service: Orthopedics;  Laterality: Right;   FINGER ARTHROPLASTY Right 04/07/2013   Procedure: RIGHT THUMB CMC ARTHROPLASTY;  Surgeon: Donnice DELENA Robinsons, MD;  Location: [facility];  Service: Orthopedics;  Laterality: Right;   GANGLION CYST EXCISION Right 04/07/2013   Procedure: RIGHT WRIST MASS EXCISION;  Surgeon: Donnice DELENA Robinsons, MD;  Location: [facility];  Service: Orthopedics;  Laterality: Right;   KNEE ARTHROSCOPY  LEFT X3 (LAST ONE 2005)   KNEE ARTHROSCOPY  05/17/2011   Procedure: ARTHROSCOPY KNEE;  Surgeon: Norleen LITTIE Hoard III;  Location: [facility];  Service: Orthopedics;  Laterality: Right;  WITH MEDIAL MENISECTOMY AND removal of suprapatella fat lump   LEFT WRIST TENOSYNECTOMY W/ LEFT THUMB JOINT REPAIR  02-24-10   LUMBAR FUSION  11-30-10   L4 - 5   LUMBAR FUSION  2000   L2 - 4   LUMBAR LAMINECTOMY  DEC 2011   L4 - 5   POSTERIOR CERVICAL FUSION/FORAMINOTOMY N/A 05/17/2017   Procedure: Cervical five  to Thorastic one  Posterior cervical fusion with lateral mass screws/revision of prior instrumentation;  Surgeon: Unice Pac, MD;  Location: [facility];  Service: Neurosurgery;  Laterality: N/A;  C5 to T1 Posterior cervical fusion with lateral mass screws/revision of prior instrumentation   SHOULDER ARTHROSCOPY WITH ROTATOR CUFF REPAIR Right 02/01/2024   Procedure: ARTHROSCOPY, SHOULDER, WITH ROTATOR CUFF REPAIR;  Surgeon: Sharl Selinda Dover,  MD;  Location: [facility];  Service: Orthopedics;  Laterality: Right;   TENDON REPAIR  JAN 2010   LEFT INDEX AND LONG FINGERS   THUMB SURGERY   04/07/2019   RIGHT THUMB   TUBAL LIGATION     Past Medical History:  Diagnosis Date   Acute sinusitis, unspecified    Allergy     Anemia    quite a few times   Anxiety state, unspecified    Arachnoiditis BILATERAL LEGS   DUE TO MULTIPLE BACK SURG.'S   Arthritis    Asthma    last flare up was 03/2017 lasted over a month   Blood transfusion    Cancer (HCC)    skin cancers (in scalp)   Cardiomyopathy HX --06/2010   EF was 25% during acute illness (PHELONEPHRITIS) Repeat echo 12-06-10 60% showed normal EF.    Chronic back pain greater than 3 months duration    S/P BACK SURG'S   CSF leak    Diabetes mellitus without complication (HCC)    dx 2013 type 2   Dyslipidemia    Dysphagia    some post op cerv fusion 2/14   Dysrhythmia    Essential hypertension, benign    Family history of adverse reaction to anesthesia    mother gets n/v   Family history of breast cancer 02/06/2022   Family history of liver cancer 02/06/2022   Family history of lung cancer 02/06/2022   Family history of ovarian cancer 02/06/2022   GERD (gastroesophageal reflux disease) AND HIATIAL HERNIA   CONTROLLED W/ NEXIUM   Headache(784.0)    History of chronic bronchitis    Hx of bladder infections    Hyperlipidemia    TAKE CHLOSTEROL MEDICATION,04/23/19   Hypertension    Neuromuscular disorder (HCC)    numbness and tingling   Osteoporosis    Other malaise and fatigue    PONV (postoperative nausea and vomiting)    Shortness of breath    Spinal headache    Spinal stenosis, cervical region    Varicosities    venous   Weakness of both legs DUE TO ARACHNOIDITIS   OCCASIONAL USES CANE   BP 129/88 (BP Location: Left Arm, Patient Position: Sitting, Cuff Size: Normal)   Pulse 89   Ht 5' 5 (1.651 m)   Wt 160 lb (72.6 kg)   SpO2 97%   BMI 26.63  kg/m   Opioid Risk Score:   Fall Risk Score:  `1  Depression screen PHQ 2/9     01/28/2024    1:01 PM 11/29/2023    1:30 PM 10/30/2023    1:38 PM 09/25/2023    1:54 PM 08/31/2023    1:44 PM 07/05/2023    2:51 PM 05/04/2023    1:27 PM  Depression screen PHQ 2/9  Decreased Interest 0 0 0 0 1 0 1  Down, Depressed, Hopeless 0 0 0 0 1 0 1  PHQ - 2 Score 0 0 0 0 2 0 2      Review of Systems  Musculoskeletal:  Positive for arthralgias, back pain and myalgias.       Right shoulder pain, bilateral leg pain, widespread back pain  All other systems reviewed and are negative.      Objective:   Physical Exam Vitals and nursing note reviewed.  Constitutional:      Appearance: Normal appearance.  Cardiovascular:     Rate and Rhythm: Normal rate and regular rhythm.     Pulses: Normal pulses.  Heart sounds: Normal heart sounds.  Pulmonary:     Effort: Pulmonary effort is normal.     Breath sounds: Normal breath sounds.  Musculoskeletal:     Comments: Normal Muscle Bulk and Muscle Testing Reveals:  Upper Extremities: Right: Decreased ROM 45 Degrees and Muscle Strength 5/5 Right AC Joint Tenderness Left Upper Extremity: Full ROM and Muscle Strength 5/5  Thoracic Paraspinal Tenderness: T-4-T-6  Lumbar Hypersensitivity Bilateral Greater Trochanter Tenderness: R>L Lower Extremities: Full ROM and Muscle Strength 5/5  Arises from chair with ease Narrow Based Gait     Skin:    General: Skin is warm and dry.  Neurological:     Mental Status: She is alert and oriented to person, place, and time.  Psychiatric:        Mood and Affect: Mood normal.        Behavior: Behavior normal.          Assessment & Plan:  1.  On 05/17/2017 :C5C6C7 T1 Posterior Cervical Fusion with lateral mass fixation with AIRO Imaging, revision of prior instrumentation. By Dr. Unice.  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 Continue current medication regimen with   Gabapentin  800 mg TID. Refilled: Oxycodone  15mg  one tablet 5 times a day  as needed for pain # 150 tablets We will continue the opioid monitoring program, this consists of regular clinic visits, examinations, urine drug screen, pill counts as well as use of [place]  Controlled Substance Reporting system. 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 Continue to Monitor. 04/24/2024 3. Anxiety/depression: PCP Following. Continue to monitor. 04/24/2024. 4. Muscle Spasms: Continue current medication regime with Flexeril . Continue to Monitor. 04/24/2024 5. Cervicalgia/ Cervical Radiculitis: Dr Unice Following. ,Continue current medication regime with Gabapentin :  S/P  C5C6C7 T1 Posterior Cervical Fusion with lateral mass fixation with AIRO Imaging, revision of prior instrumentation by Dr. Unice on 05/17/2017. 04/24/2024 7. Bilateral Thoracic Back Pain: Continue HEP as Tolerated and Continue current medication regimen. Continue to Monitor. 04/24/2024  8. Left lower extremity DVT/ Phlebitis:  S/P endovenous laser ablation of left great saphenous vein on 05/09/2018 by Dr. Eliza:  Vascular  Following. 04/24/2024. 9. Insomnia: Continue Pamelor . Continue to Monitor. 04/24/2024. 10.Right Shoulder Pain / Right Shoulder Tendonitis: [facility] Following.S/P: on 02/01/2024  ARTHROSCOPY, SHOULDER, WITH ROTATOR CUFF REPAIR Right Choice  TENODESIS, BICEPS      Continue to monitor.  04/24/2024.   F/U in 2 months

## 2024-05-02 DIAGNOSIS — M6281 Muscle weakness (generalized): Secondary | ICD-10-CM | POA: Diagnosis not present

## 2024-05-02 DIAGNOSIS — M25511 Pain in right shoulder: Secondary | ICD-10-CM | POA: Diagnosis not present

## 2024-05-05 DIAGNOSIS — M6281 Muscle weakness (generalized): Secondary | ICD-10-CM | POA: Diagnosis not present

## 2024-05-05 DIAGNOSIS — M25511 Pain in right shoulder: Secondary | ICD-10-CM | POA: Diagnosis not present

## 2024-05-07 DIAGNOSIS — M6281 Muscle weakness (generalized): Secondary | ICD-10-CM | POA: Diagnosis not present

## 2024-05-07 DIAGNOSIS — M25511 Pain in right shoulder: Secondary | ICD-10-CM | POA: Diagnosis not present

## 2024-05-12 DIAGNOSIS — M6281 Muscle weakness (generalized): Secondary | ICD-10-CM | POA: Diagnosis not present

## 2024-05-12 DIAGNOSIS — M25511 Pain in right shoulder: Secondary | ICD-10-CM | POA: Diagnosis not present

## 2024-05-19 DIAGNOSIS — K760 Fatty (change of) liver, not elsewhere classified: Secondary | ICD-10-CM | POA: Diagnosis not present

## 2024-05-22 DIAGNOSIS — M75121 Complete rotator cuff tear or rupture of right shoulder, not specified as traumatic: Secondary | ICD-10-CM | POA: Diagnosis not present

## 2024-05-22 DIAGNOSIS — Z4789 Encounter for other orthopedic aftercare: Secondary | ICD-10-CM | POA: Diagnosis not present

## 2024-05-25 NOTE — Patient Instructions (Incomplete)
 Moderate persistent asthma without complication  - stable   Plan - - Continue Symbicort and Singulair scheduled - albuterol as needed -   Multiple lung nodules - last CT Dec 2021 Family history of cancer  - brother with recent diagnosis of lung cancer with mets to liver in Sep 03, 2021 and passed away 11/04/22- dad died of liver cancer - CT chest August 2023  and Feb 2025 without cancer - appreciat your concern of high risk for lung cancer  Plan  - expectant followup  Enlarged heart on CT Aortic atherosclerosis on CT  Plan  - get echo   Rash Right Upper Extermity  Plan - talk to your PCP Toma Deiters, MD   Follow-up - 6  months or sooner if needed

## 2024-05-25 NOTE — Progress Notes (Deleted)
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 getting lung cancer.  Previa CT scan August 2023 was reassuring and radiologist felt no need for scan but her brother had lung cancer and she came to a shared understanding with him that she would be very aggressive for lung cancer screening.  Therefore she wants this CT scan of the chest.  She had agreed for 24-month time point which would be February 2025.  Agreed to order CT scan of the chest without contrast.  OV 08/29/2023  Subjective:  Patient ID: [name] [name], female , DOB: 02-16-1951 , age 72 y.o. , MRN: [id number] , ADDRESS: [address] [place] [address] PCP Orpha Yancey LABOR, MD Patient Care Team: Orpha Yancey LABOR, MD as PCP - General (Unknown Physician Specialty)  This Provider for this visit: Treatment Team:  Attending Provider: Geronimo Amel, MD    08/29/2023 -   Chief Complaint  Patient presents with   Follow-up    Overall doing well. She has occ hoarseness and dry cough.      HPI [name] [name] 73 y.o. -follow-up asthma with family  history of lung cancer with concern for lung cancer and a previous history of nodules  She presents for follow-up.  States her respiratory status is fine.  She is on Singulair  and scheduled Symbicort  no issues in her respiratory status.  She had a CT scan of the chest because of fear of lung cancer and previous history of nodules and there were no nodules reported but she is worried about atherosclerosis that was documented.  I showed her the calcium  plaque in the aorta and is very small.  In addition there is no coronary artery calcification.  I reassured her about this.  She is worried about also cardiomegaly reported on the CT scan.  I did not appreciated that much on the CT scan but have agreed to get the echo because the last one was 4 years ago.  She is quite keen on getting an echo.    CT Chest data from date: 08/03/2023  Narrative & Impression  CLINICAL DATA:  Lung nodules.   EXAM:  CT CHEST WITHOUT CONTRAST   TECHNIQUE: Multidetector CT imaging of the chest was performed following the standard protocol without IV contrast.   RADIATION DOSE REDUCTION: This exam was performed according to the departmental dose-optimization program which includes automated exposure control, adjustment of the mA and/or kV according to patient size and/or use of iterative reconstruction technique.   COMPARISON:  01/18/2022.   FINDINGS: Cardiovascular: Atherosclerotic calcification of the aorta. Heart is enlarged. No pericardial effusion.   Mediastinum/Nodes: No pathologically enlarged mediastinal or axillary lymph nodes. Hilar regions are difficult to definitively evaluate without IV contrast. Esophagus is grossly unremarkable.   Lungs/Pleura: Mild scattered pulmonary parenchymal scarring. No suspicious pulmonary nodules. Millimetric pulmonary nodules in the dependent lower lobes, some of which are calcified, unchanged from 01/18/2022 and benign. No pleural fluid. Airway is unremarkable.    Upper Abdomen: Liver may be mildly heterogeneous in attenuation. Cholecystectomy. Visualized portions of the liver, adrenal glands, left kidney, spleen, pancreas, stomach and bowel are otherwise grossly unremarkable. No upper abdominal adenopathy.   Musculoskeletal: Degenerative changes in the spine. Old T6 superior endplate compression fracture. Slight compression of the T10 superior endplate is new in the interval but age indeterminate.   IMPRESSION: 1. No suspicious pulmonary nodules requiring follow-up. 2. Liver may be mildly steatotic. 3.  Aortic atherosclerosis (ICD10-I70.0).     Electronically Signed   By: Newell Eke M.D.   On: 08/21/2023 16:11    OV 05/25/2024  Subjective:  Patient ID: [name] [name], female , DOB: 1951/03/24 , age 26 y.o. , MRN: [id number] , ADDRESS: [address] [place] [address] PCP Orpha Yancey LABOR, MD Patient Care Team: Orpha Yancey LABOR, MD as PCP - General (Unknown Physician Specialty) Michele Richardson, DO as PCP - Cardiology (Cardiology)  This Provider for this visit: Treatment Team:  Attending Provider: Geronimo Amel, MD    05/25/2024 -  No chief complaint on file.    HPI MERIAH SHANDS 73 y.o. -    CT Chest data from date: ****  - personally visualized and independently interpreted : *** - my findings are: ***   PFT      No data to display         ECHO may 2025   IMPRESSIONS     1. Left ventricular ejection fraction, by estimation, is 45 to 50%. The  left ventricle has mildly decreased function. The average left ventricular  global longitudinal strain is -20.5 %.   2. Right ventricular systolic function is low normal. The right  ventricular size is normal.   3. The mitral valve is normal in structure. Mild mitral valve  regurgitation.   4. The aortic valve is tricuspid. Aortic valve regurgitation is not  visualized.   5. The inferior vena cava is normal in size with greater than 50%  respiratory  variability, suggesting right atrial pressure of 3 mmHg.      LAB RESULTS last 96 hours No results found.       has a past medical history of Acute sinusitis, unspecified, Allergy , Anemia, Anxiety state, unspecified, Arachnoiditis (BILATERAL LEGS), Arthritis, Asthma, Blood transfusion, Cancer (HCC), Cardiomyopathy (HX --06/2010), Chronic back pain greater than 3 months duration, CSF leak, Diabetes mellitus without complication (HCC), Dyslipidemia, Dysphagia, Dysrhythmia, Essential hypertension, benign, Family history of adverse reaction to anesthesia, Family history of breast cancer (02/06/2022), Family history of liver cancer (02/06/2022), Family history of lung cancer (02/06/2022), Family history of ovarian cancer (02/06/2022), GERD (gastroesophageal reflux disease) (AND HIATIAL HERNIA), Headache(784.0), History of chronic  bronchitis, bladder infections, Hyperlipidemia, Hypertension, Neuromuscular disorder (HCC), Osteoporosis, Other malaise and fatigue, PONV (postoperative nausea and vomiting), Shortness of breath, Spinal headache, Spinal stenosis, cervical region, Varicosities, and Weakness of both legs (DUE TO ARACHNOIDITIS).   reports that she has never smoked. She has never used smokeless tobacco.  Past Surgical History:  Procedure Laterality Date   ABDOMINAL HYSTERECTOMY  1987   W/ BSO   ANTERIOR CERVICAL DECOMP/DISCECTOMY FUSION N/A 08/09/2012   Procedure: ANTERIOR CERVICAL DECOMPRESSION/DISCECTOMY FUSION 1 LEVEL;  Surgeon: Catalina CHRISTELLA Stains, MD;  Location: [facility];  Service: Neurosurgery;  Laterality: N/A;  Cervical four-five  Anterior cervical decompression/diskectomy/fusion   ANTERIOR FUSION CERVICAL SPINE  2007   C5 -6   APPENDECTOMY     APPLICATION OF INTRAOPERATIVE CT SCAN N/A 05/17/2017   Procedure: APPLICATION OF INTRAOPERATIVE CT SCAN;  Surgeon: Unice Pac, MD;  Location: [facility];  Service: Neurosurgery;  Laterality: N/A;   BACK SURGERY     x 5   BICEPT TENODESIS Right  02/01/2024   Procedure: TENODESIS, BICEPS;  Surgeon: Sharl Selinda Dover, MD;  Location: [facility];  Service: Orthopedics;  Laterality: Right;  Right shoulder arthroscopy with rotator cuff repair. biceps tenodesis and subacromial decompression   BREAST EXCISIONAL BIOPSY Bilateral    No scar seen    BREAST SURGERY     x 2 biopsies   CARPAL TUNNEL RELEASE  RIGHT - 2001  & DEC 2011 W/ BACK SURG.   CERVICAL DISC SURGERY  2005   C5 - 6   CHOLECYSTECTOMY  1994   COLONOSCOPY WITH PROPOFOL  N/A 05/16/2022   Procedure: COLONOSCOPY WITH PROPOFOL ;  Surgeon: Eartha Angelia Sieving, MD;  Location: [facility];  Service: Gastroenterology;  Laterality: N/A;  945 ASA 3   DORSAL COMPARTMENT RELEASE Right 04/07/2013   Procedure: RIGHT WRIST STS RELEASE;  Surgeon: Donnice DELENA Robinsons, MD;  Location: [facility];  Service: Orthopedics;  Laterality: Right;   ENDOVENOUS ABLATION SAPHENOUS VEIN W/ LASER Right 05/29/2019   endovenous laser ablation right greater saphenous vein by Medford Blade MD    EXPLORATION OF INCISION FOR CSF LEAK  DEC 2011  X2   POST LAMINECOTMY   FINGER ARTHRODESIS Right 04/07/2013   Procedure: RIGHT INDEX AND RIGHT LONG DISTAL INTERPHALANGEAL JOINT FUSIONS;  Surgeon: Donnice DELENA Robinsons, MD;  Location: [facility];  Service: Orthopedics;  Laterality: Right;   FINGER ARTHROPLASTY Right 04/07/2013   Procedure: RIGHT THUMB CMC ARTHROPLASTY;  Surgeon: Donnice DELENA Robinsons, MD;  Location: [facility];  Service: Orthopedics;  Laterality: Right;   GANGLION CYST EXCISION Right 04/07/2013   Procedure: RIGHT WRIST MASS EXCISION;  Surgeon: Donnice DELENA Robinsons, MD;  Location: [facility];  Service: Orthopedics;  Laterality: Right;   KNEE ARTHROSCOPY  LEFT X3 (LAST ONE 2005)   KNEE ARTHROSCOPY  05/17/2011   Procedure: ARTHROSCOPY KNEE;  Surgeon: Norleen LITTIE Hoard III;  Location: [facility];  Service: Orthopedics;   Laterality: Right;  WITH MEDIAL MENISECTOMY AND removal of suprapatella fat lump   LEFT WRIST TENOSYNECTOMY W/ LEFT THUMB JOINT REPAIR  02-24-10   LUMBAR FUSION  11-30-10   L4 - 5   LUMBAR FUSION  2000   L2 - 4   LUMBAR LAMINECTOMY  DEC 2011   L4 - 5   POSTERIOR CERVICAL FUSION/FORAMINOTOMY N/A 05/17/2017   Procedure: Cervical five  to Thorastic one  Posterior cervical fusion with lateral mass screws/revision of prior instrumentation;  Surgeon: Unice,  Fairy, MD;  Location: [facility];  Service: Neurosurgery;  Laterality: N/A;  C5 to T1 Posterior cervical fusion with lateral mass screws/revision of prior instrumentation   SHOULDER ARTHROSCOPY WITH ROTATOR CUFF REPAIR Right 02/01/2024   Procedure: ARTHROSCOPY, SHOULDER, WITH ROTATOR CUFF REPAIR;  Surgeon: Sharl Selinda Dover, MD;  Location: [facility];  Service: Orthopedics;  Laterality: Right;   TENDON REPAIR  JAN 2010   LEFT INDEX AND LONG FINGERS   THUMB SURGERY   04/07/2019   RIGHT THUMB   TUBAL LIGATION      Allergies  Allergen Reactions   Latex Itching, Rash and Other (See Comments)    Other Reaction(s): Not available   Morphine  And Codeine Hives and Itching   Acetaminophen  Nausea And Vomiting    Other Reaction(s): GI Intolerance  Other Reaction(s): Not available  acetaminophen    Fentanyl       I dont like it   Hydrocodone Other (See Comments)    Other Reaction(s): Not available   Hydrocodone-Acetaminophen  Itching    acetaminophen  / hydrocodone   Morphine  Itching and Other (See Comments)    Headaches  Other Reaction(s): Not available, Not available, Not available, Not available, Not available   Oxycodone  Hcl    Oxycodone -Acetaminophen  Itching and Other (See Comments)    acetaminophen  / oxycodone    Oxycodone -Aspirin  Other (See Comments)   Aspirin  Nausea And Vomiting    Can take coated asa  Other Reaction(s): GI Intolerance  Other Reaction(s): Not available  aluminum aspirin    Clarithromycin Diarrhea  and Nausea And Vomiting   Clarithromycin Nausea And Vomiting    Other Reaction(s): GI Intolerance  Other Reaction(s): Not available  clarithromycin   Codeine Itching and Nausea And Vomiting    codeine   Darvocet [Propoxyphene N-Acetaminophen ] Itching and Nausea And Vomiting   Hydrocodone-Acetaminophen  Itching and Nausea And Vomiting   Lortab [Hydrocodone-Acetaminophen ] Itching and Nausea And Vomiting   Percocet [Oxycodone -Acetaminophen ] Itching and Nausea And Vomiting   Tramadol  Nausea And Vomiting and Other (See Comments)    Immunization History  Administered Date(s) Administered   Fluad Quad(high Dose 65+) 04/01/2019   INFLUENZA, HIGH DOSE SEASONAL PF 04/25/2018   Influenza Split 04/19/2013, 03/18/2014   Influenza,inj,Quad PF,6+ Mos 05/04/2015, 02/27/2017   PFIZER(Purple Top)SARS-COV-2 Vaccination 03/06/2020, 03/25/2020   Pneumococcal Polysaccharide-23 04/19/2013   Zoster, Live 05/30/2013    Family History  Problem Relation Age of Onset   Hypertension Mother    Heart disease Mother        Mitral valve replacement.    Diabetes Mother    Liver cancer Father 30       HCC   Diabetes Brother    Lung cancer Brother 67       mets   Melanoma Brother 60       neck and back   Cancer Paternal Uncle        unknown type; dx after 61   Breast cancer Paternal Grandmother        dx 59s   Ovarian cancer Cousin        paternal cousin; d. 30s   Cancer Cousin        GYN; d. 74s   Colon cancer Neg Hx    Colon polyps Neg Hx    Esophageal cancer Neg Hx    Rectal cancer Neg Hx    Stomach cancer Neg Hx      Current Outpatient Medications:    albuterol  (PROVENTIL ) (2.5 MG/3ML) 0.083% nebulizer solution, Take 2.5 mg by nebulization every 4 (  four) hours as needed for wheezing or shortness of breath., Disp: , Rfl:    albuterol  (VENTOLIN  HFA) 108 (90 Base) MCG/ACT inhaler, INHALE 2 PUFFS BY MOUTH EVERY 6 HOURS AS NEEDED FOR WHEEZING OR SHORTNESS OF BREATH, Disp: 8.5 g, Rfl: 4   aspirin   EC 81 MG tablet, Take 81 mg by mouth at bedtime. , Disp: , Rfl:    Biotin 1000 MCG tablet, Take 1,000 mcg by mouth daily., Disp: , Rfl:    budesonide -formoterol  (SYMBICORT ) 160-4.5 MCG/ACT inhaler, Inhale 2 puffs into the lungs 2 (two) times daily., Disp: 1 Inhaler, Rfl: 5   diltiazem  (CARDIZEM  CD) 120 MG 24 hr capsule, Take 120 mg by mouth daily., Disp: , Rfl:    gabapentin  (NEURONTIN ) 800 MG tablet, Take 1 tablet (800 mg total) by mouth 3 (three) times daily., Disp: 270 tablet, Rfl: 1   losartan -hydrochlorothiazide  (HYZAAR) 50-12.5 MG tablet, Take 1 tablet by mouth daily., Disp: , Rfl:    metFORMIN  (GLUCOPHAGE ) 500 MG tablet, Take 500 mg by mouth daily with breakfast., Disp: , Rfl:    methocarbamol  (ROBAXIN ) 500 MG tablet, Take 500 mg by mouth 3 (three) times daily., Disp: , Rfl:    milk thistle 175 MG tablet, Take by mouth daily., Disp: , Rfl:    montelukast  (SINGULAIR ) 10 MG tablet, Take 10 mg by mouth daily., Disp: , Rfl:    nortriptyline  (PAMELOR ) 25 MG capsule, Take 1 capsule (25 mg total) by mouth at bedtime. Three month supply, Disp: 90 capsule, Rfl: 1   omeprazole (PRILOSEC) 40 MG capsule, Take 40 mg by mouth daily., Disp: , Rfl:    ondansetron  (ZOFRAN -ODT) 4 MG disintegrating tablet, Take 1 tablet (4 mg total) by mouth every 8 (eight) hours as needed for vomiting or nausea., Disp: 20 tablet, Rfl: 0   oxyCODONE  (ROXICODONE ) 15 MG immediate release tablet, Take 1 tablet (15 mg total) by mouth 5 (five) times daily as needed for pain., Disp: 150 tablet, Rfl: 0   simvastatin  (ZOCOR ) 10 MG tablet, Take 10 mg by mouth every evening., Disp: , Rfl:    topiramate  (TOPAMAX ) 25 MG tablet, Take 1 tablet (25 mg total) by mouth 2 (two) times daily., Disp: 180 tablet, Rfl: 3   TRUE METRIX BLOOD GLUCOSE TEST test strip, daily., Disp: , Rfl:    valACYclovir  (VALTREX ) 500 MG tablet, Take 500 mg by mouth daily. , Disp: , Rfl:  No current facility-administered medications for this  visit.  Facility-Administered Medications Ordered in Other Visits:    levalbuterol  (XOPENEX ) nebulizer solution 0.63 mg, 0.63 mg, Nebulization, Once, Parrett, Tammy S, NP      Objective:   There were no vitals filed for this visit.  Estimated body mass index is 26.63 kg/m as calculated from the following:   Height as of 04/24/24: 5' 5 (1.651 m).   Weight as of 04/24/24: 160 lb (72.6 kg).  @WEIGHTCHANGE @  There were no vitals filed for this visit.   Physical Exam   General: No distress. *** O2 at rest: *** Cane present: *** Sitting in wheel chair: *** Frail: *** Obese: *** Neuro: Alert and Oriented x 3. GCS 15. Speech normal Psych: Pleasant Resp:  Barrel Chest - ***.  Wheeze - ***, Crackles - ***, No overt respiratory distress CVS: Normal heart sounds. Murmurs - *** Ext: Stigmata of Connective Tissue Disease - *** HEENT: Normal upper airway. PEERL +. No post nasal drip        Assessment/     Assessment & Plan Moderate  persistent asthma without complication    PLAN Patient Instructions  Moderate persistent asthma without complication  - stable   Plan - - Continue Symbicort  and Singulair  scheduled - albuterol  as needed -   Multiple lung nodules - last CT 01/14/22Family history of cancer  - brother with recent diagnosis of lung cancer with mets to liver in 02-Jul-2022 and passed away 12-01-2022- dad died of liver cancer - CT chest August 2023  and Feb 2025 without cancer - appreciat your concern of high risk for lung cancer  Plan  - expectant followup  Enlarged heart on CT Aortic atherosclerosis on CT  Plan  - get echo   Rash Right Upper Extermity  Plan - talk to your PCP Orpha Yancey LABOR, MD   Follow-up - 6  months or sooner if needed    FOLLOWUP    No follow-ups on file.    SIGNATURE    Dr. Dorethia Cave, M.D., F.C.C.P,  Pulmonary and Critical Care Medicine Staff Physician, [facility] Director - Interstitial  Lung Disease  Program  Pulmonary Fibrosis [facility] at [facility] [place], [place], [address]  Pager: [phone], If no answer or between  15:00h - 7:00h: call [phone] Telephone: [phone]  9:48 PM 05/25/2024   Moderate Complexity MDM OFFICE  07-02-2020 E/M guidelines, first released in 2020-07-02, with minor revisions added in July 02, 2022 and 07-03-2023 Must meet the requirements for 2 out of 3 dimensions to qualify.    Number and complexity of problems addressed Amount and/or complexity of data reviewed Risk of complications and/or morbidity  One or more chronic illness with mild exacerbation, OR progression, OR  side effects of treatment  Two or more stable chronic illnesses  One undiagnosed new problem with uncertain prognosis  One acute illness with systemic symptoms   One Acute complicated injury Must meet the requirements for 1 of 3 of the categories)  Category 1: Tests and documents, historian  Any combination of 3 of the following:  Assessment requiring an independent historian  Review of prior external note(s) from each unique source  Review of results of each unique test  Ordering of each unique test    Category 2: Interpretation of tests   Independent interpretation of a test performed by another physician/other qualified health care professional (not separately reported)  Category 3: Discuss management/tests  Discussion of management or test interpretation with external physician/other qualified health care professional/appropriate source (not separately reported) Moderate risk of morbidity from additional diagnostic testing or treatment Examples only:  Prescription drug management  Decision regarding minor surgery with identfied patient or procedure risk factors  Decision regarding elective major surgery without identified patient or procedure risk factors  Diagnosis or treatment significantly limited by social determinants of  health             HIGh Complexity  OFFICE   07-02-20 E/M guidelines, first released in 2020-07-02, with minor revisions added in 07-02-22. Must meet the requirements for 2 out of 3 dimensions to qualify.    Number and complexity of problems addressed Amount and/or complexity of data reviewed Risk of complications and/or morbidity  Severe exacerbation of chronic illness  Acute or chronic illnesses that may pose a threat to life or bodily function, e.g., multiple trauma, acute MI, pulmonary embolus, severe respiratory distress, progressive rheumatoid arthritis, psychiatric illness with potential threat to self or others, peritonitis, acute renal failure, abrupt change in neurological  status Must meet the requirements for 2 of 3 of the categories)  Category 1: Tests and documents, historian  Any combination of 3 of the following:  Assessment requiring an independent historian  Review of prior external note(s) from each unique source  Review of results of each unique test  Ordering of each unique test    Category 2: Interpretation of tests    Independent interpretation of a test performed by another physician/other qualified health care professional (not separately reported)  Category 3: Discuss management/tests  Discussion of management or test interpretation with external physician/other qualified health care professional/appropriate source (not separately reported)  HIGH risk of morbidity from additional diagnostic testing or treatment Examples only:  Drug therapy requiring intensive monitoring for toxicity  Decision for elective major surgery with identified pateint or procedure risk factors  Decision regarding hospitalization or escalation of level of care  Decision for DNR or to de-escalate care   Parenteral controlled  substances            LEGEND - Independent interpretation involves the interpretation of a test for which there is a CPT code, and an interpretation  or report is customary. When a review and interpretation of a test is performed and documented by the provider, but not separately reported (billed), then this would represent an independent interpretation. This report does not need to conform to the usual standards of a complete report of the test. This does not include interpretation of tests that do not have formal reports such as a complete blood count with differential and blood cultures. Examples would include reviewing a chest radiograph and documenting in the medical record an interpretation, but not separately reporting (billing) the interpretation of the chest radiograph.   An appropriate source includes professionals who are not health care professionals but may be involved in the management of the patient, such as a clinical research associate, upper officer, case manager or teacher, and does not include discussion with family or informal caregivers.    - SDOH: SDOH are the conditions in the environments where people are born, live, learn, work, play, worship, and age that affect a wide range of health, functioning, and quality-of-life outcomes and risks. (e.g., housing, food insecurity, transportation, etc.). SDOH-related Z codes ranging from Z55-Z65 are the ICD-10-CM diagnosis codes used to document SDOH data Z55 - Problems related to education and literacy Z56 - Problems related to employment and unemployment Z57 - Occupational exposure to risk factors Z58 - Problems related to physical environment Z59 - Problems related to housing and economic circumstances [phone] - Problems related to social environment [phone] - Problems related to upbringing [phone] - Other problems related to primary support group, including family circumstances Z57 - Problems related to certain psychosocial circumstances Z65 - Problems related to other psychosocial circumstances

## 2024-05-26 ENCOUNTER — Ambulatory Visit: Admitting: Internal Medicine

## 2024-05-26 DIAGNOSIS — J4540 Moderate persistent asthma, uncomplicated: Secondary | ICD-10-CM

## 2024-05-30 ENCOUNTER — Encounter: Attending: Physical Medicine & Rehabilitation | Admitting: Registered Nurse

## 2024-05-30 ENCOUNTER — Encounter: Payer: Self-pay | Admitting: Registered Nurse

## 2024-05-30 VITALS — BP 137/86 | HR 84 | Ht 65.0 in | Wt 159.0 lb

## 2024-05-30 DIAGNOSIS — M25511 Pain in right shoulder: Secondary | ICD-10-CM | POA: Insufficient documentation

## 2024-05-30 DIAGNOSIS — M961 Postlaminectomy syndrome, not elsewhere classified: Secondary | ICD-10-CM

## 2024-05-30 DIAGNOSIS — Z79891 Long term (current) use of opiate analgesic: Secondary | ICD-10-CM

## 2024-05-30 DIAGNOSIS — G8929 Other chronic pain: Secondary | ICD-10-CM | POA: Diagnosis present

## 2024-05-30 DIAGNOSIS — M5416 Radiculopathy, lumbar region: Secondary | ICD-10-CM

## 2024-05-30 DIAGNOSIS — M21372 Foot drop, left foot: Secondary | ICD-10-CM | POA: Insufficient documentation

## 2024-05-30 DIAGNOSIS — M542 Cervicalgia: Secondary | ICD-10-CM | POA: Insufficient documentation

## 2024-05-30 DIAGNOSIS — M546 Pain in thoracic spine: Secondary | ICD-10-CM

## 2024-05-30 DIAGNOSIS — M21371 Foot drop, right foot: Secondary | ICD-10-CM

## 2024-05-30 DIAGNOSIS — Z5181 Encounter for therapeutic drug level monitoring: Secondary | ICD-10-CM

## 2024-05-30 DIAGNOSIS — G894 Chronic pain syndrome: Secondary | ICD-10-CM

## 2024-05-30 MED ORDER — OXYCODONE HCL 15 MG PO TABS: 15.0000 mg | ORAL_TABLET | Freq: Every day | ORAL | 0 refills | Status: DC | PRN

## 2024-05-30 NOTE — Progress Notes (Signed)
 Subjective:    Patient ID: [name] [name], female    DOB: 26-Aug-1950, 73 y.o.   MRN: [id number]  HPI: [name] [name] is a 73 y.o. female who returns for follow up appointment for chronic pain and medication refill. She states her pain is located in her neck, right shoulder, upper- lower back radiating into her bilateral lower extremities. She also reports increase lumbar radicular pain. She also states she noticed when she is walking, she feels as thought she is walking funny, she states this has been going on for the last three months, [name] gait was assessed, slapping sound noted with her gait.  Dr. Lovorn assessed [name] with this provider, Foot drop noted, she will F/U with Dr Colon, she was instructed to purchase Bilateral Foot up  brace from [facility], she verbalizes understanding  She rates her pain 8. Her current exercise regime is walking and performing stretching exercises.  [name] Morphine  equivalent is 112.50 MME.   Last Oral Swab was Performed on 03/31/2024, it was consistent.   Pain Inventory Average Pain 7 Pain Right Now 8 My pain is sharp, burning, stabbing, tingling, and aching  In the last 24 hours, has pain interfered with the following? General activity 7 Relation with others 7 Enjoyment of life 7 What TIME of day is your pain at its worst? morning , daytime, evening, and night Sleep (in general) Fair  Pain is worse with: walking, bending, sitting, inactivity, standing, and some activites Pain improves with: rest, heat/ice, and pacing activities Relief from Meds: 7  Family History  Problem Relation Age of Onset   Hypertension Mother    Heart disease Mother        Mitral valve replacement.    Diabetes Mother    Liver cancer Father 50       HCC   Diabetes Brother    Lung cancer Brother 56       mets   Melanoma Brother 30       neck and back   Cancer Paternal Uncle        unknown type; dx after 29   Breast cancer Paternal Grandmother        dx 71s    Ovarian cancer Cousin        paternal cousin; d. 30s   Cancer Cousin        GYN; d. 67s   Colon cancer Neg Hx    Colon polyps Neg Hx    Esophageal cancer Neg Hx    Rectal cancer Neg Hx    Stomach cancer Neg Hx    Social History   Socioeconomic History   Marital status: Legally Separated    Spouse name: Not on file   Number of children: Not on file   Years of education: Not on file   Highest education level: Not on file  Occupational History   Occupation: disable    Employer: DISABLED  Tobacco Use   Smoking status: Never   Smokeless tobacco: Never  Vaping Use   Vaping status: Never Used  Substance and Sexual Activity   Alcohol  use: No   Drug use: No   Sexual activity: Not on file    Comment: second hand smoke  Other Topics Concern   Not on file  Social History Narrative   Has one child lives at home with her.    Social Drivers of Health   Tobacco Use: Low Risk (05/30/2024)   Patient History    Smoking  Tobacco Use: Never    Smokeless Tobacco Use: Never    Passive Exposure: Not on file  Financial Resource Strain: Not on file  Food Insecurity: Not on file  Transportation Needs: Not on file  Physical Activity: Not on file  Stress: Not on file  Social Connections: Not on file  Depression (PHQ2-9): Low Risk (01/28/2024)   Depression (PHQ2-9)    PHQ-2 Score: 0  Alcohol  Screen: Not on file  Housing: Not on file  Utilities: Not on file  Health Literacy: Not on file   Past Surgical History:  Procedure Laterality Date   ABDOMINAL HYSTERECTOMY  1987   W/ BSO   ANTERIOR CERVICAL DECOMP/DISCECTOMY FUSION N/A 08/09/2012   Procedure: ANTERIOR CERVICAL DECOMPRESSION/DISCECTOMY FUSION 1 LEVEL;  Surgeon: Catalina CHRISTELLA Stains, MD;  Location: [facility];  Service: Neurosurgery;  Laterality: N/A;  Cervical four-five  Anterior cervical decompression/diskectomy/fusion   ANTERIOR FUSION CERVICAL SPINE  2007   C5 -6   APPENDECTOMY     APPLICATION OF INTRAOPERATIVE CT SCAN N/A  05/17/2017   Procedure: APPLICATION OF INTRAOPERATIVE CT SCAN;  Surgeon: Unice Pac, MD;  Location: [facility];  Service: Neurosurgery;  Laterality: N/A;   BACK SURGERY     x 5   BICEPT TENODESIS Right 02/01/2024   Procedure: TENODESIS, BICEPS;  Surgeon: Sharl Selinda Dover, MD;  Location: [facility];  Service: Orthopedics;  Laterality: Right;  Right shoulder arthroscopy with rotator cuff repair. biceps tenodesis and subacromial decompression   BREAST EXCISIONAL BIOPSY Bilateral    No scar seen    BREAST SURGERY     x 2 biopsies   CARPAL TUNNEL RELEASE  RIGHT - 2001  & DEC 2011 W/ BACK SURG.   CERVICAL DISC SURGERY  2005   C5 - 6   CHOLECYSTECTOMY  1994   COLONOSCOPY WITH PROPOFOL  N/A 05/16/2022   Procedure: COLONOSCOPY WITH PROPOFOL ;  Surgeon: Eartha Angelia Sieving, MD;  [facility];  Service: Gastroenterology;  Laterality: N/A;  945 ASA 3   DORSAL COMPARTMENT RELEASE Right 04/07/2013   Procedure: RIGHT WRIST STS RELEASE;  Surgeon: Donnice DELENA Robinsons, MD;  Location: [facility];  Service: Orthopedics;  Laterality: Right;   ENDOVENOUS ABLATION SAPHENOUS VEIN W/ LASER Right 05/29/2019   endovenous laser ablation right greater saphenous vein by Medford Blade MD    EXPLORATION OF INCISION FOR CSF LEAK  DEC 2011  X2   POST LAMINECOTMY   FINGER ARTHRODESIS Right 04/07/2013   Procedure: RIGHT INDEX AND RIGHT LONG DISTAL INTERPHALANGEAL JOINT FUSIONS;  Surgeon: Donnice DELENA Robinsons, MD;  Location: [facility];  Service: Orthopedics;  Laterality: Right;   FINGER ARTHROPLASTY Right 04/07/2013   Procedure: RIGHT THUMB CMC ARTHROPLASTY;  Surgeon: Donnice DELENA Robinsons, MD;  Location: [facility];  Service: Orthopedics;  Laterality: Right;   GANGLION CYST EXCISION Right 04/07/2013   Procedure: RIGHT WRIST MASS EXCISION;  Surgeon: Donnice DELENA Robinsons, MD;  Location: [facility];  Service: Orthopedics;  Laterality: Right;    KNEE ARTHROSCOPY  LEFT X3 (LAST ONE 2005)   KNEE ARTHROSCOPY  05/17/2011   Procedure: ARTHROSCOPY KNEE;  Surgeon: Norleen LITTIE Hoard III;  Location: [facility];  Service: Orthopedics;  Laterality: Right;  WITH MEDIAL MENISECTOMY AND removal of suprapatella fat lump   LEFT WRIST TENOSYNECTOMY W/ LEFT THUMB JOINT REPAIR  02-24-10   LUMBAR FUSION  11-30-10   L4 - 5   LUMBAR FUSION  2000   L2 -  4   LUMBAR LAMINECTOMY  DEC 2011   L4 - 5   POSTERIOR CERVICAL FUSION/FORAMINOTOMY N/A 05/17/2017   Procedure: Cervical five  to Thorastic one  Posterior cervical fusion with lateral mass screws/revision of prior instrumentation;  Surgeon: Unice Pac, MD;  Location: [facility];  Service: Neurosurgery;  Laterality: N/A;  C5 to T1 Posterior cervical fusion with lateral mass screws/revision of prior instrumentation   SHOULDER ARTHROSCOPY WITH ROTATOR CUFF REPAIR Right 02/01/2024   Procedure: ARTHROSCOPY, SHOULDER, WITH ROTATOR CUFF REPAIR;  Surgeon: Sharl Selinda Dover, MD;  Location: [facility];  Service: Orthopedics;  Laterality: Right;   TENDON REPAIR  JAN 2010   LEFT INDEX AND LONG FINGERS   THUMB SURGERY   04/07/2019   RIGHT THUMB   TUBAL LIGATION     Past Surgical History:  Procedure Laterality Date   ABDOMINAL HYSTERECTOMY  1987   W/ BSO   ANTERIOR CERVICAL DECOMP/DISCECTOMY FUSION N/A 08/09/2012   Procedure: ANTERIOR CERVICAL DECOMPRESSION/DISCECTOMY FUSION 1 LEVEL;  Surgeon: Catalina CHRISTELLA Stains, MD;  Location: [facility];  Service: Neurosurgery;  Laterality: N/A;  Cervical four-five  Anterior cervical decompression/diskectomy/fusion   ANTERIOR FUSION CERVICAL SPINE  2007   C5 -6   APPENDECTOMY     APPLICATION OF INTRAOPERATIVE CT SCAN N/A 05/17/2017   Procedure: APPLICATION OF INTRAOPERATIVE CT SCAN;  Surgeon: Unice Pac, MD;  Location: [facility];  Service: Neurosurgery;  Laterality: N/A;   BACK SURGERY     x 5   BICEPT TENODESIS Right 02/01/2024   Procedure:  TENODESIS, BICEPS;  Surgeon: Sharl Selinda Dover, MD;  Location: [facility];  Service: Orthopedics;  Laterality: Right;  Right shoulder arthroscopy with rotator cuff repair. biceps tenodesis and subacromial decompression   BREAST EXCISIONAL BIOPSY Bilateral    No scar seen    BREAST SURGERY     x 2 biopsies   CARPAL TUNNEL RELEASE  RIGHT - 2001  & DEC 2011 W/ BACK SURG.   CERVICAL DISC SURGERY  2005   C5 - 6   CHOLECYSTECTOMY  1994   COLONOSCOPY WITH PROPOFOL  N/A 05/16/2022   Procedure: COLONOSCOPY WITH PROPOFOL ;  Surgeon: Eartha Angelia Sieving, MD;  [facility];  Service: Gastroenterology;  Laterality: N/A;  945 ASA 3   DORSAL COMPARTMENT RELEASE Right 04/07/2013   Procedure: RIGHT WRIST STS RELEASE;  Surgeon: Donnice DELENA Robinsons, MD;  Location: [facility];  Service: Orthopedics;  Laterality: Right;   ENDOVENOUS ABLATION SAPHENOUS VEIN W/ LASER Right 05/29/2019   endovenous laser ablation right greater saphenous vein by Medford Blade MD    EXPLORATION OF INCISION FOR CSF LEAK  DEC 2011  X2   POST LAMINECOTMY   FINGER ARTHRODESIS Right 04/07/2013   Procedure: RIGHT INDEX AND RIGHT LONG DISTAL INTERPHALANGEAL JOINT FUSIONS;  Surgeon: Donnice DELENA Robinsons, MD;  Location: [facility];  Service: Orthopedics;  Laterality: Right;   FINGER ARTHROPLASTY Right 04/07/2013   Procedure: RIGHT THUMB CMC ARTHROPLASTY;  Surgeon: Donnice DELENA Robinsons, MD;  Location: [facility];  Service: Orthopedics;  Laterality: Right;   GANGLION CYST EXCISION Right 04/07/2013   Procedure: RIGHT WRIST MASS EXCISION;  Surgeon: Donnice DELENA Robinsons, MD;  Location: [facility];  Service: Orthopedics;  Laterality: Right;   KNEE ARTHROSCOPY  LEFT X3 (LAST ONE 2005)   KNEE ARTHROSCOPY  05/17/2011   Procedure: ARTHROSCOPY KNEE;  Surgeon: Norleen LITTIE Hoard III;  Location: [facility];  Service: Orthopedics;  Laterality:  Right;  WITH  MEDIAL MENISECTOMY AND removal of suprapatella fat lump   LEFT WRIST TENOSYNECTOMY W/ LEFT THUMB JOINT REPAIR  02-24-10   LUMBAR FUSION  11-30-10   L4 - 5   LUMBAR FUSION  2000   L2 - 4   LUMBAR LAMINECTOMY  DEC 2011   L4 - 5   POSTERIOR CERVICAL FUSION/FORAMINOTOMY N/A 05/17/2017   Procedure: Cervical five  to Thorastic one  Posterior cervical fusion with lateral mass screws/revision of prior instrumentation;  Surgeon: Unice Pac, MD;  Location: [facility];  Service: Neurosurgery;  Laterality: N/A;  C5 to T1 Posterior cervical fusion with lateral mass screws/revision of prior instrumentation   SHOULDER ARTHROSCOPY WITH ROTATOR CUFF REPAIR Right 02/01/2024   Procedure: ARTHROSCOPY, SHOULDER, WITH ROTATOR CUFF REPAIR;  Surgeon: Sharl Selinda Dover, MD;  Location: [facility];  Service: Orthopedics;  Laterality: Right;   TENDON REPAIR  JAN 2010   LEFT INDEX AND LONG FINGERS   THUMB SURGERY   04/07/2019   RIGHT THUMB   TUBAL LIGATION     Past Medical History:  Diagnosis Date   Acute sinusitis, unspecified    Allergy     Anemia    quite a few times   Anxiety state, unspecified    Arachnoiditis BILATERAL LEGS   DUE TO MULTIPLE BACK SURG.'S   Arthritis    Asthma    last flare up was 03/2017 lasted over a month   Blood transfusion    Cancer (HCC)    skin cancers (in scalp)   Cardiomyopathy HX --06/2010   EF was 25% during acute illness (PHELONEPHRITIS) Repeat echo 12-06-10 60% showed normal EF.    Chronic back pain greater than 3 months duration    S/P BACK SURG'S   CSF leak    Diabetes mellitus without complication (HCC)    dx 2013 type 2   Dyslipidemia    Dysphagia    some post op cerv fusion 2/14   Dysrhythmia    Essential hypertension, benign    Family history of adverse reaction to anesthesia    mother gets n/v   Family history of breast cancer 02/06/2022   Family history of liver cancer 02/06/2022   Family history of lung cancer 02/06/2022   Family history  of ovarian cancer 02/06/2022   GERD (gastroesophageal reflux disease) AND HIATIAL HERNIA   CONTROLLED W/ NEXIUM   Headache(784.0)    History of chronic bronchitis    Hx of bladder infections    Hyperlipidemia    TAKE CHLOSTEROL MEDICATION,04/23/19   Hypertension    Neuromuscular disorder (HCC)    numbness and tingling   Osteoporosis    Other malaise and fatigue    PONV (postoperative nausea and vomiting)    Shortness of breath    Spinal headache    Spinal stenosis, cervical region    Varicosities    venous   Weakness of both legs DUE TO ARACHNOIDITIS   OCCASIONAL USES CANE   BP 137/86 (BP Location: Left Arm, Patient Position: Sitting, Cuff Size: Large) Comment: Second BP reading  Pulse 84   Ht 5' 5 (1.651 m)   Wt 159 lb (72.1 kg)   SpO2 95%   BMI 26.46 kg/m   Opioid Risk Score:   Fall Risk Score:  `1  Depression screen PHQ 2/9     01/28/2024    1:01 PM 11/29/2023    1:30 PM 10/30/2023    1:38 PM 09/25/2023    1:54 PM 08/31/2023  1:44 PM 07/05/2023    2:51 PM 05/04/2023    1:27 PM  Depression screen PHQ 2/9  Decreased Interest 0 0 0 0 1 0 1  Down, Depressed, Hopeless 0 0 0 0 1 0 1  PHQ - 2 Score 0 0 0 0 2 0 2      Review of Systems  Musculoskeletal:  Positive for back pain and myalgias.       Upper to lower back pain, right shoulder and arm pain, bilateral leg pain  All other systems reviewed and are negative.      Objective:   Physical Exam Vitals and nursing note reviewed.  Constitutional:      Appearance: Normal appearance.  Cardiovascular:     Rate and Rhythm: Normal rate and regular rhythm.     Pulses: Normal pulses.     Heart sounds: Normal heart sounds.  Pulmonary:     Effort: Pulmonary effort is normal.     Breath sounds: Normal breath sounds.  Musculoskeletal:     Comments: Normal Muscle Bulk and Muscle Testing Reveals:  Upper Extremities: Right: Decreased ROM 90 Degrees and Muscle Strength  4/5 Left Upper Extremity: Full ROM and Muscle  Strength 5/5 Thoracic Paraspinal Tenderness: T-4-T-6 Lumbar Paraspinal Tenderness: L-3-L-5 Lower Extremities: Full ROM and Muscle Strength 5/5 Arises from Table slowly Narrow Based  Gait     Skin:    General: Skin is warm.  Neurological:     Mental Status: She is alert and oriented to person, place, and time.  Psychiatric:        Mood and Affect: Mood normal.        Behavior: Behavior normal.          Assessment & Plan:  1.  On 05/17/2017 :C5C6C7 T1 Posterior Cervical Fusion with lateral mass fixation with AIR[facility] Imaging, revision of prior instrumentation. By Dr. Unice.  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 Continue current medication regimen with  Gabapentin  800 mg TID. Refilled: Oxycodone  15mg  one tablet 5 times a day  as needed for pain # 150 tablets We will continue the opioid monitoring program, this consists of regular clinic visits, examinations, urine drug screen, pill counts as well as use of [place]  Controlled Substance Reporting system. 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 Continue to Monitor. 05/30/2024 3. Anxiety/depression: PCP Following. Continue to monitor. 05/30/2024. 4. Muscle Spasms: Continue current medication regime with Flexeril . Continue to Monitor. 05/30/2024 5. Cervicalgia/ Cervical Radiculitis: Dr Unice Following. ,Continue current medication regime with Gabapentin :  S/P  C5C6C7 T1 Posterior Cervical Fusion with lateral mass fixation with AIRO Imaging, revision of prior instrumentation by Dr. Unice on 05/17/2017. 05/30/2024 7. Bilateral Thoracic Back Pain: Continue HEP as Tolerated and Continue current medication regimen. Continue to Monitor. 05/30/2024  8. Left lower extremity DVT/ Phlebitis:  S/P endovenous laser ablation of  left great saphenous vein on 05/09/2018 by Dr. Eliza:  Vascular  Following. 05/30/2024. 9. Insomnia: Continue Pamelor . Continue to Monitor. 05/30/2024. 10.Right Shoulder Pain / Right Shoulder Tendonitis: [facility] Following.S/P: on 02/01/2024  ARTHROSCOPY, SHOULDER, WITH ROTATOR CUFF REPAIR Right [facility]  TENODESIS, BICEPS      Continue to monitor.  05/30/2024. 11. Bilateral Foot Drop: She will purchase Bilateral Foot Up Brace: She was instructed to F/U with Dr. Colon, she verbalizes understanding.    F/U in 1 month

## 2024-05-30 NOTE — Patient Instructions (Addendum)
 Ms. Alfred: Dr Cornelio Recommendation:  Foot up Brace: For Bilateral Foot Drop : at [facility]   Call Dr Colon office to schedule appointment

## 2024-06-26 ENCOUNTER — Encounter: Attending: Physical Medicine & Rehabilitation | Admitting: Registered Nurse

## 2024-06-26 ENCOUNTER — Encounter: Payer: Self-pay | Admitting: Registered Nurse

## 2024-06-26 VITALS — BP 129/80 | HR 94 | Ht 65.0 in

## 2024-06-26 DIAGNOSIS — M21372 Foot drop, left foot: Secondary | ICD-10-CM | POA: Diagnosis not present

## 2024-06-26 DIAGNOSIS — M21371 Foot drop, right foot: Secondary | ICD-10-CM | POA: Diagnosis not present

## 2024-06-26 DIAGNOSIS — M961 Postlaminectomy syndrome, not elsewhere classified: Secondary | ICD-10-CM | POA: Insufficient documentation

## 2024-06-26 DIAGNOSIS — G894 Chronic pain syndrome: Secondary | ICD-10-CM | POA: Diagnosis not present

## 2024-06-26 DIAGNOSIS — M5416 Radiculopathy, lumbar region: Secondary | ICD-10-CM | POA: Insufficient documentation

## 2024-06-26 DIAGNOSIS — Z5181 Encounter for therapeutic drug level monitoring: Secondary | ICD-10-CM | POA: Diagnosis not present

## 2024-06-26 DIAGNOSIS — M542 Cervicalgia: Secondary | ICD-10-CM | POA: Diagnosis not present

## 2024-06-26 DIAGNOSIS — G8929 Other chronic pain: Secondary | ICD-10-CM | POA: Diagnosis present

## 2024-06-26 DIAGNOSIS — M25511 Pain in right shoulder: Secondary | ICD-10-CM | POA: Diagnosis not present

## 2024-06-26 DIAGNOSIS — Z79891 Long term (current) use of opiate analgesic: Secondary | ICD-10-CM | POA: Diagnosis not present

## 2024-06-26 DIAGNOSIS — M546 Pain in thoracic spine: Secondary | ICD-10-CM | POA: Insufficient documentation

## 2024-06-26 MED ORDER — OXYCODONE HCL 15 MG PO TABS: 15.0000 mg | ORAL_TABLET | Freq: Every day | ORAL | 0 refills | Status: DC | PRN

## 2024-06-26 NOTE — Progress Notes (Unsigned)
 "  Subjective:    Patient ID: [name] [name], female    DOB: [birth year], 74 y.o.   MRN: [id number]  HPI: [name] [name] is a 74 y.o. female who returns for follow up appointment for chronic pain and medication refill. states *** pain is located in  ***. rates pain ***. current exercise regime is walking and performing stretching exercises.  [name] Morphine  equivalent is 112.50 MME.   Last Oral Swab was Performed on 03/31/2024, it was consistent.      Pain Inventory Average Pain 7 Pain Right Now 9 My pain is sharp, burning, stabbing, tingling, and aching  In the last 24 hours, has pain interfered with the following? General activity 8 Relation with others 8 Enjoyment of life 8 What TIME of day is your pain at its worst? morning , daytime, evening, and night Sleep (in general) Fair  Pain is worse with: walking, bending, sitting, inactivity, standing, and some activites Pain improves with: rest, heat/ice, and medication Relief from Meds: 7  Family History  Problem Relation Age of Onset   Hypertension Mother    Heart disease Mother        Mitral valve replacement.    Diabetes Mother    Liver cancer Father 5       HCC   Diabetes Brother    Lung cancer Brother 64       mets   Melanoma Brother 40       neck and back   Cancer Paternal Uncle        unknown type; dx after 54   Breast cancer Paternal Grandmother        dx 59s   Ovarian cancer Cousin        paternal cousin; d. 30s   Cancer Cousin        GYN; d. 69s   Colon cancer Neg Hx    Colon polyps Neg Hx    Esophageal cancer Neg Hx    Rectal cancer Neg Hx    Stomach cancer Neg Hx    Social History   Socioeconomic History   Marital status: Legally Separated    Spouse name: Not on file   Number of children: Not on file   Years of education: Not on file   Highest education level: Not on file  Occupational History   Occupation: disable    Employer: DISABLED  Tobacco Use   Smoking status: Never   Smokeless  tobacco: Never  Vaping Use   Vaping status: Never Used  Substance and Sexual Activity   Alcohol  use: No   Drug use: No   Sexual activity: Not on file    Comment: second hand smoke  Other Topics Concern   Not on file  Social History Narrative   Has one child lives at home with her.    Social Drivers of Health   Tobacco Use: Low Risk (06/26/2024)   Patient History    Smoking Tobacco Use: Never    Smokeless Tobacco Use: Never    Passive Exposure: Not on file  Financial Resource Strain: Not on file  Food Insecurity: Not on file  Transportation Needs: Not on file  Physical Activity: Not on file  Stress: Not on file  Social Connections: Not on file  Depression (PHQ2-9): Low Risk (06/26/2024)   Depression (PHQ2-9)    PHQ-2 Score: 0  Alcohol  Screen: Not on file  Housing: Not on file  Utilities: Not on file  Health Literacy: Not on file  Past Surgical History:  Procedure Laterality Date   ABDOMINAL HYSTERECTOMY  1987   W/ BSO   ANTERIOR CERVICAL DECOMP/DISCECTOMY FUSION N/A 08/09/2012   Procedure: ANTERIOR CERVICAL DECOMPRESSION/DISCECTOMY FUSION 1 LEVEL;  Surgeon: Catalina CHRISTELLA Stains, MD;  Location: [facility];  Service: Neurosurgery;  Laterality: N/A;  Cervical four-five  Anterior cervical decompression/diskectomy/fusion   ANTERIOR FUSION CERVICAL SPINE  2007   C5 -6   APPENDECTOMY     APPLICATION OF INTRAOPERATIVE CT SCAN N/A 05/17/2017   Procedure: APPLICATION OF INTRAOPERATIVE CT SCAN;  Surgeon: Unice Pac, MD;  Location: [facility] OR;  Service: Neurosurgery;  Laterality: N/A;   BACK SURGERY     x 5   BICEPT TENODESIS Right 02/01/2024   Procedure: TENODESIS, BICEPS;  Surgeon: Sharl Selinda Dover, MD;  Location: [facility];  Service: Orthopedics;  Laterality: Right;  Right shoulder arthroscopy with rotator cuff repair. biceps tenodesis and subacromial decompression   BREAST EXCISIONAL BIOPSY Bilateral    No scar seen    BREAST SURGERY     x 2 biopsies   CARPAL  TUNNEL RELEASE  RIGHT - 2001  & DEC 2011 W/ BACK SURG.   CERVICAL DISC SURGERY  2005   C5 - 6   CHOLECYSTECTOMY  1994   COLONOSCOPY WITH PROPOFOL  N/A 05/16/2022   Procedure: COLONOSCOPY WITH PROPOFOL ;  Surgeon: Eartha Angelia Sieving, MD;  [facility];  Service: Gastroenterology;  Laterality: N/A;  945 ASA 3   DORSAL COMPARTMENT RELEASE Right 04/07/2013   Procedure: RIGHT WRIST STS RELEASE;  Surgeon: Donnice DELENA Robinsons, MD;  Location: [facility];  Service: Orthopedics;  Laterality: Right;   ENDOVENOUS ABLATION SAPHENOUS VEIN W/ LASER Right 05/29/2019   endovenous laser ablation right greater saphenous vein by Medford Blade MD    EXPLORATION OF INCISION FOR CSF LEAK  DEC 2011  X2   POST LAMINECOTMY   FINGER ARTHRODESIS Right 04/07/2013   Procedure: RIGHT INDEX AND RIGHT LONG DISTAL INTERPHALANGEAL JOINT FUSIONS;  Surgeon: Donnice DELENA Robinsons, MD;  Location: [facility];  Service: Orthopedics;  Laterality: Right;   FINGER ARTHROPLASTY Right 04/07/2013   Procedure: RIGHT THUMB CMC ARTHROPLASTY;  Surgeon: Donnice DELENA Robinsons, MD;  Location: [facility];  Service: Orthopedics;  Laterality: Right;   GANGLION CYST EXCISION Right 04/07/2013   Procedure: RIGHT WRIST MASS EXCISION;  Surgeon: Donnice DELENA Robinsons, MD;  Location: [facility];  Service: Orthopedics;  Laterality: Right;   KNEE ARTHROSCOPY  LEFT X3 (LAST ONE 2005)   KNEE ARTHROSCOPY  05/17/2011   Procedure: ARTHROSCOPY KNEE;  Surgeon: Norleen LITTIE Hoard III;  Location: [facility];  Service: Orthopedics;  Laterality: Right;  WITH MEDIAL MENISECTOMY AND removal of suprapatella fat lump   LEFT WRIST TENOSYNECTOMY W/ LEFT THUMB JOINT REPAIR  02-24-10   LUMBAR FUSION  11-30-10   L4 - 5   LUMBAR FUSION  2000   L2 - 4   LUMBAR LAMINECTOMY  DEC 2011   L4 - 5   POSTERIOR CERVICAL FUSION/FORAMINOTOMY N/A 05/17/2017   Procedure: Cervical five  to Thorastic one   Posterior cervical fusion with lateral mass screws/revision of prior instrumentation;  Surgeon: Unice Pac, MD;  Location: [facility];  Service: Neurosurgery;  Laterality: N/A;  C5 to T1 Posterior cervical fusion with lateral mass screws/revision of prior instrumentation   SHOULDER ARTHROSCOPY WITH ROTATOR CUFF REPAIR Right 02/01/2024   Procedure: ARTHROSCOPY, SHOULDER, WITH ROTATOR CUFF REPAIR;  Surgeon: Sharl Selinda Dover, MD;  Location: [facility];  Service: Orthopedics;  Laterality: Right;   TENDON REPAIR  JAN 2010   LEFT INDEX AND LONG FINGERS   THUMB SURGERY   04/07/2019   RIGHT THUMB   TUBAL LIGATION     Past Surgical History:  Procedure Laterality Date   ABDOMINAL HYSTERECTOMY  1987   W/ BSO   ANTERIOR CERVICAL DECOMP/DISCECTOMY FUSION N/A 08/09/2012   Procedure: ANTERIOR CERVICAL DECOMPRESSION/DISCECTOMY FUSION 1 LEVEL;  Surgeon: Catalina CHRISTELLA Stains, MD;  Location: [facility];  Service: Neurosurgery;  Laterality: N/A;  Cervical four-five  Anterior cervical decompression/diskectomy/fusion   ANTERIOR FUSION CERVICAL SPINE  2007   C5 -6   APPENDECTOMY     APPLICATION OF INTRAOPERATIVE CT SCAN N/A 05/17/2017   Procedure: APPLICATION OF INTRAOPERATIVE CT SCAN;  Surgeon: Unice Pac, MD;  Location: [facility] OR;  Service: Neurosurgery;  Laterality: N/A;   BACK SURGERY     x 5   BICEPT TENODESIS Right 02/01/2024   Procedure: TENODESIS, BICEPS;  Surgeon: Sharl Selinda Dover, MD;  Location: [facility];  Service: Orthopedics;  Laterality: Right;  Right shoulder arthroscopy with rotator cuff repair. biceps tenodesis and subacromial decompression   BREAST EXCISIONAL BIOPSY Bilateral    No scar seen    BREAST SURGERY     x 2 biopsies   CARPAL TUNNEL RELEASE  RIGHT - 2001  & DEC 2011 W/ BACK SURG.   CERVICAL DISC SURGERY  2005   C5 - 6   CHOLECYSTECTOMY  1994   COLONOSCOPY WITH PROPOFOL  N/A 05/16/2022   Procedure: COLONOSCOPY WITH PROPOFOL ;  Surgeon: Eartha Angelia Sieving, MD;  [facility];  Service: Gastroenterology;  Laterality: N/A;  945 ASA 3   DORSAL COMPARTMENT RELEASE Right 04/07/2013   Procedure: RIGHT WRIST STS RELEASE;  Surgeon: Donnice DELENA Robinsons, MD;  Location: [facility];  Service: Orthopedics;  Laterality: Right;   ENDOVENOUS ABLATION SAPHENOUS VEIN W/ LASER Right 05/29/2019   endovenous laser ablation right greater saphenous vein by Medford Blade MD    EXPLORATION OF INCISION FOR CSF LEAK  DEC 2011  X2   POST LAMINECOTMY   FINGER ARTHRODESIS Right 04/07/2013   Procedure: RIGHT INDEX AND RIGHT LONG DISTAL INTERPHALANGEAL JOINT FUSIONS;  Surgeon: Donnice DELENA Robinsons, MD;  Location: [facility];  Service: Orthopedics;  Laterality: Right;   FINGER ARTHROPLASTY Right 04/07/2013   Procedure: RIGHT THUMB CMC ARTHROPLASTY;  Surgeon: Donnice DELENA Robinsons, MD;  Location: [facility];  Service: Orthopedics;  Laterality: Right;   GANGLION CYST EXCISION Right 04/07/2013   Procedure: RIGHT WRIST MASS EXCISION;  Surgeon: Donnice DELENA Robinsons, MD;  Location: [facility];  Service: Orthopedics;  Laterality: Right;   KNEE ARTHROSCOPY  LEFT X3 (LAST ONE 2005)   KNEE ARTHROSCOPY  05/17/2011   Procedure: ARTHROSCOPY KNEE;  Surgeon: Norleen LITTIE Hoard III;  Location: [facility];  Service: Orthopedics;  Laterality: Right;  WITH MEDIAL MENISECTOMY AND removal of suprapatella fat lump   LEFT WRIST TENOSYNECTOMY W/ LEFT THUMB JOINT REPAIR  02-24-10   LUMBAR FUSION  11-30-10   L4 - 5   LUMBAR FUSION  2000   L2 - 4   LUMBAR LAMINECTOMY  DEC 2011   L4 - 5   POSTERIOR CERVICAL FUSION/FORAMINOTOMY N/A 05/17/2017   Procedure: Cervical five  to Thorastic one  Posterior cervical fusion with lateral mass screws/revision of prior instrumentation;  Surgeon: Unice Pac, MD;  Location: [facility];  Service:  Neurosurgery;  Laterality: N/A;  C5 to T1 Posterior cervical fusion with lateral mass  screws/revision of prior instrumentation   SHOULDER ARTHROSCOPY WITH ROTATOR CUFF REPAIR Right 02/01/2024   Procedure: ARTHROSCOPY, SHOULDER, WITH ROTATOR CUFF REPAIR;  Surgeon: Sharl Selinda Dover, MD;  Location: [facility];  Service: Orthopedics;  Laterality: Right;   TENDON REPAIR  JAN 2010   LEFT INDEX AND LONG FINGERS   THUMB SURGERY   04/07/2019   RIGHT THUMB   TUBAL LIGATION     Past Medical History:  Diagnosis Date   Acute sinusitis, unspecified    Allergy     Anemia    quite a few times   Anxiety state, unspecified    Arachnoiditis BILATERAL LEGS   DUE TO MULTIPLE BACK SURG.'S   Arthritis    Asthma    last flare up was 03/2017 lasted over a month   Blood transfusion    Cancer (HCC)    skin cancers (in scalp)   Cardiomyopathy HX --06/2010   EF was 25% during acute illness (PHELONEPHRITIS) Repeat echo 12-06-10 60% showed normal EF.    Chronic back pain greater than 3 months duration    S/P BACK SURG'S   CSF leak    Diabetes mellitus without complication (HCC)    dx 2013 type 2   Dyslipidemia    Dysphagia    some post op cerv fusion 2/14   Dysrhythmia    Essential hypertension, benign    Family history of adverse reaction to anesthesia    mother gets n/v   Family history of breast cancer 02/06/2022   Family history of liver cancer 02/06/2022   Family history of lung cancer 02/06/2022   Family history of ovarian cancer 02/06/2022   GERD (gastroesophageal reflux disease) AND HIATIAL HERNIA   CONTROLLED W/ NEXIUM   Headache(784.0)    History of chronic bronchitis    Hx of bladder infections    Hyperlipidemia    TAKE CHLOSTEROL MEDICATION,04/23/19   Hypertension    Neuromuscular disorder (HCC)    numbness and tingling   Osteoporosis    Other malaise and fatigue    PONV (postoperative nausea and vomiting)    Shortness of breath    Spinal headache    Spinal stenosis, cervical region    Varicosities    venous   Weakness of both legs DUE TO  ARACHNOIDITIS   OCCASIONAL USES CANE   BP 129/80   Pulse 94   Ht 5' 5 (1.651 m)   SpO2 97%   BMI 26.46 kg/m   Opioid Risk Score:   Fall Risk Score:  `1  Depression screen [facility] 2/9     06/26/2024   12:47 PM 01/28/2024    1:01 PM 11/29/2023    1:30 PM 10/30/2023    1:38 PM 09/25/2023    1:54 PM 08/31/2023    1:44 PM 07/05/2023    2:51 PM  Depression screen PHQ 2/9  Decreased Interest 0 0 0 0 0 1 0  Down, Depressed, Hopeless 0 0 0 0 0 1 0  PHQ - 2 Score 0 0 0 0 0 2 0    Review of Systems  Musculoskeletal:  Positive for back pain and gait problem.  All other systems reviewed and are negative.      Objective:   Physical Exam        Assessment & Plan:  1.  On 05/17/2017 :C5C6C7 T1 Posterior Cervical Fusion with lateral mass fixation with AIRO Imaging, revision of  prior instrumentation. By Dr. Unice.  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 Continue current medication regimen with  Gabapentin  800 mg TID. Refilled: Oxycodone  15mg  one tablet 5 times a day  as needed for pain # 150 tablets We will continue the opioid monitoring program, this consists of regular clinic visits, examinations, urine drug screen, pill counts as well as use of [place]  Controlled Substance Reporting system. 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 Continue to Monitor. 05/30/2024 3. Anxiety/depression: PCP Following. Continue to monitor. 05/30/2024. 4. Muscle Spasms: Continue current medication regime with Flexeril . Continue to Monitor. 05/30/2024 5. Cervicalgia/ Cervical Radiculitis: Dr Unice Following. ,Continue current medication regime with Gabapentin :  S/P  C5C6C7 T1 Posterior Cervical Fusion with lateral mass fixation with AIRO Imaging, revision of prior instrumentation by  Dr. Unice on 05/17/2017. 05/30/2024 7. Bilateral Thoracic Back Pain: Continue HEP as Tolerated and Continue current medication regimen. Continue to Monitor. 05/30/2024  8. Left lower extremity DVT/ Phlebitis:  S/P endovenous laser ablation of left great saphenous vein on 05/09/2018 by Dr. Eliza:  Vascular  Following. 05/30/2024. 9. Insomnia: Continue Pamelor . Continue to Monitor. 05/30/2024. 10.Right Shoulder Pain / Right Shoulder Tendonitis: [facility] Following.S/P: on 02/01/2024  ARTHROSCOPY, SHOULDER, WITH ROTATOR CUFF REPAIR Right Choice  TENODESIS, BICEPS      Continue to monitor.  05/30/2024. 11. Bilateral Foot Drop: She will purchase Bilateral Foot Up Brace: She was instructed to F/U with Dr. Colon, she verbalizes understanding.    F/U in 1 month  "

## 2024-07-17 ENCOUNTER — Other Ambulatory Visit (HOSPITAL_COMMUNITY): Payer: Self-pay | Admitting: Neurological Surgery

## 2024-07-17 ENCOUNTER — Other Ambulatory Visit: Payer: Self-pay | Admitting: Neurological Surgery

## 2024-07-17 DIAGNOSIS — M5136 Other intervertebral disc degeneration, lumbar region with discogenic back pain only: Secondary | ICD-10-CM

## 2024-07-25 ENCOUNTER — Encounter: Attending: Physical Medicine & Rehabilitation | Admitting: Registered Nurse

## 2024-07-25 VITALS — BP 109/73 | HR 87

## 2024-07-25 DIAGNOSIS — M21371 Foot drop, right foot: Secondary | ICD-10-CM

## 2024-07-25 DIAGNOSIS — M5416 Radiculopathy, lumbar region: Secondary | ICD-10-CM

## 2024-07-25 DIAGNOSIS — Z5181 Encounter for therapeutic drug level monitoring: Secondary | ICD-10-CM

## 2024-07-25 DIAGNOSIS — G894 Chronic pain syndrome: Secondary | ICD-10-CM

## 2024-07-25 DIAGNOSIS — M5412 Radiculopathy, cervical region: Secondary | ICD-10-CM

## 2024-07-25 DIAGNOSIS — Z79891 Long term (current) use of opiate analgesic: Secondary | ICD-10-CM

## 2024-07-25 DIAGNOSIS — M542 Cervicalgia: Secondary | ICD-10-CM

## 2024-07-25 DIAGNOSIS — G8929 Other chronic pain: Secondary | ICD-10-CM

## 2024-07-25 DIAGNOSIS — M961 Postlaminectomy syndrome, not elsewhere classified: Secondary | ICD-10-CM

## 2024-07-25 MED ORDER — OXYCODONE HCL 15 MG PO TABS: 15.0000 mg | ORAL_TABLET | Freq: Every day | ORAL | 0 refills | Status: AC | PRN

## 2024-07-25 NOTE — Progress Notes (Unsigned)
 "  Subjective:    Patient ID: [name] [name], female    DOB: 02/16/1951, 74 y.o.   MRN: [id number]  HPI: [name] [name] is a 74 y.o. female who returns for follow up appointment for chronic pain and medication refill. states *** pain is located in  ***. rates pain ***. current exercise regime is walking and performing stretching exercises.  Ms. Bump Morphine  equivalent is *** MME.   Oral Swab was Performed today.     Pain Inventory Average Pain 9 Pain Right Now 9 My pain is sharp, burning, stabbing, tingling, and aching  In the last 24 hours, has pain interfered with the following? General activity 9 Relation with others 9 Enjoyment of life 9 What TIME of day is your pain at its worst? morning , daytime, evening, and night Sleep (in general) Fair  Pain is worse with: walking, bending, sitting, inactivity, standing, and some activites Pain improves with: rest, therapy/exercise, pacing activities, and medication Relief from Meds: 7  Family History  Problem Relation Age of Onset   Hypertension Mother    Heart disease Mother        Mitral valve replacement.    Diabetes Mother    Liver cancer Father 47       HCC   Diabetes Brother    Lung cancer Brother 35       mets   Melanoma Brother 27       neck and back   Cancer Paternal Uncle        unknown type; dx after 44   Breast cancer Paternal Grandmother        dx 60s   Ovarian cancer Cousin        paternal cousin; d. 30s   Cancer Cousin        GYN; d. 54s   Colon cancer Neg Hx    Colon polyps Neg Hx    Esophageal cancer Neg Hx    Rectal cancer Neg Hx    Stomach cancer Neg Hx    Social History   Socioeconomic History   Marital status: Legally Separated    Spouse name: Not on file   Number of children: Not on file   Years of education: Not on file   Highest education level: Not on file  Occupational History   Occupation: disable    Employer: DISABLED  Tobacco Use   Smoking status: Never   Smokeless tobacco: Never   Vaping Use   Vaping status: Never Used  Substance and Sexual Activity   Alcohol  use: No   Drug use: No   Sexual activity: Not on file    Comment: second hand smoke  Other Topics Concern   Not on file  Social History Narrative   Has one child lives at home with her.    Social Drivers of Health   Tobacco Use: Low Risk (06/26/2024)   Patient History    Smoking Tobacco Use: Never    Smokeless Tobacco Use: Never    Passive Exposure: Not on file  Financial Resource Strain: Not on file  Food Insecurity: Not on file  Transportation Needs: Not on file  Physical Activity: Not on file  Stress: Not on file  Social Connections: Not on file  Depression (PHQ2-9): Low Risk (06/26/2024)   Depression (PHQ2-9)    PHQ-2 Score: 0  Alcohol  Screen: Not on file  Housing: Not on file  Utilities: Not on file  Health Literacy: Not on file   Past Surgical History:  Procedure Laterality Date   ABDOMINAL HYSTERECTOMY  1987   W/ BSO   ANTERIOR CERVICAL DECOMP/DISCECTOMY FUSION N/A 08/09/2012   Procedure: ANTERIOR CERVICAL DECOMPRESSION/DISCECTOMY FUSION 1 LEVEL;  Surgeon: Catalina CHRISTELLA Stains, MD;  Location: [facility];  Service: Neurosurgery;  Laterality: N/A;  Cervical four-five  Anterior cervical decompression/diskectomy/fusion   ANTERIOR FUSION CERVICAL SPINE  2007   C5 -6   APPENDECTOMY     APPLICATION OF INTRAOPERATIVE CT SCAN N/A 05/17/2017   Procedure: APPLICATION OF INTRAOPERATIVE CT SCAN;  Surgeon: Unice Pac, MD;  Location: [facility];  Service: Neurosurgery;  Laterality: N/A;   BACK SURGERY     x 5   BICEPT TENODESIS Right 02/01/2024   Procedure: TENODESIS, BICEPS;  Surgeon: Sharl Selinda Dover, MD;  Location: [facility];  Service: Orthopedics;  Laterality: Right;  Right shoulder arthroscopy with rotator cuff repair. biceps tenodesis and subacromial decompression   BREAST EXCISIONAL BIOPSY Bilateral    No scar seen    BREAST SURGERY     x 2 biopsies   CARPAL TUNNEL RELEASE   RIGHT - 2001  & DEC 2011 W/ BACK SURG.   CERVICAL DISC SURGERY  2005   C5 - 6   CHOLECYSTECTOMY  1994   COLONOSCOPY WITH PROPOFOL  N/A 05/16/2022   Procedure: COLONOSCOPY WITH PROPOFOL ;  Surgeon: Eartha Angelia Sieving, MD;  [facility];  Service: Gastroenterology;  Laterality: N/A;  945 ASA 3   DORSAL COMPARTMENT RELEASE Right 04/07/2013   Procedure: RIGHT WRIST STS RELEASE;  Surgeon: Donnice DELENA Robinsons, MD;  Location: [facility];  Service: Orthopedics;  Laterality: Right;   ENDOVENOUS ABLATION SAPHENOUS VEIN W/ LASER Right 05/29/2019   endovenous laser ablation right greater saphenous vein by Medford Blade MD    EXPLORATION OF INCISION FOR CSF LEAK  DEC 2011  X2   POST LAMINECOTMY   FINGER ARTHRODESIS Right 04/07/2013   Procedure: RIGHT INDEX AND RIGHT LONG DISTAL INTERPHALANGEAL JOINT FUSIONS;  Surgeon: Donnice DELENA Robinsons, MD;  Location: [facility];  Service: Orthopedics;  Laterality: Right;   FINGER ARTHROPLASTY Right 04/07/2013   Procedure: RIGHT THUMB CMC ARTHROPLASTY;  Surgeon: Donnice DELENA Robinsons, MD;  Location: [facility];  Service: Orthopedics;  Laterality: Right;   GANGLION CYST EXCISION Right 04/07/2013   Procedure: RIGHT WRIST MASS EXCISION;  Surgeon: Donnice DELENA Robinsons, MD;  Location: [facility];  Service: Orthopedics;  Laterality: Right;   KNEE ARTHROSCOPY  LEFT X3 (LAST ONE 2005)   KNEE ARTHROSCOPY  05/17/2011   Procedure: ARTHROSCOPY KNEE;  Surgeon: Norleen LITTIE Hoard III;  Location: [facility];  Service: Orthopedics;  Laterality: Right;  WITH MEDIAL MENISECTOMY AND removal of suprapatella fat lump   LEFT WRIST TENOSYNECTOMY W/ LEFT THUMB JOINT REPAIR  02-24-10   LUMBAR FUSION  11-30-10   L4 - 5   LUMBAR FUSION  2000   L2 - 4   LUMBAR LAMINECTOMY  DEC 2011   L4 - 5   POSTERIOR CERVICAL FUSION/FORAMINOTOMY N/A 05/17/2017   Procedure: Cervical five  to Thorastic one  Posterior cervical  fusion with lateral mass screws/revision of prior instrumentation;  Surgeon: Unice Pac, MD;  Location: [facility];  Service: Neurosurgery;  Laterality: N/A;  C5 to T1 Posterior cervical fusion with lateral mass screws/revision of prior instrumentation   SHOULDER ARTHROSCOPY WITH ROTATOR CUFF REPAIR Right 02/01/2024   Procedure: ARTHROSCOPY, SHOULDER, WITH ROTATOR CUFF REPAIR;  Surgeon: Sharl Selinda Dover, MD;  Location: [facility];  Service: Orthopedics;  Laterality: Right;   TENDON REPAIR  JAN 2010   LEFT INDEX AND LONG FINGERS   THUMB SURGERY   04/07/2019   RIGHT THUMB   TUBAL LIGATION     Past Surgical History:  Procedure Laterality Date   ABDOMINAL HYSTERECTOMY  1987   W/ BSO   ANTERIOR CERVICAL DECOMP/DISCECTOMY FUSION N/A 08/09/2012   Procedure: ANTERIOR CERVICAL DECOMPRESSION/DISCECTOMY FUSION 1 LEVEL;  Surgeon: Catalina CHRISTELLA Stains, MD;  Location: [facility];  Service: Neurosurgery;  Laterality: N/A;  Cervical four-five  Anterior cervical decompression/diskectomy/fusion   ANTERIOR FUSION CERVICAL SPINE  2007   C5 -6   APPENDECTOMY     APPLICATION OF INTRAOPERATIVE CT SCAN N/A 05/17/2017   Procedure: APPLICATION OF INTRAOPERATIVE CT SCAN;  Surgeon: Unice Pac, MD;  Location: [facility];  Service: Neurosurgery;  Laterality: N/A;   BACK SURGERY     x 5   BICEPT TENODESIS Right 02/01/2024   Procedure: TENODESIS, BICEPS;  Surgeon: Sharl Selinda Dover, MD;  Location: [facility];  Service: Orthopedics;  Laterality: Right;  Right shoulder arthroscopy with rotator cuff repair. biceps tenodesis and subacromial decompression   BREAST EXCISIONAL BIOPSY Bilateral    No scar seen    BREAST SURGERY     x 2 biopsies   CARPAL TUNNEL RELEASE  RIGHT - 2001  & DEC 2011 W/ BACK SURG.   CERVICAL DISC SURGERY  2005   C5 - 6   CHOLECYSTECTOMY  1994   COLONOSCOPY WITH PROPOFOL  N/A 05/16/2022   Procedure: COLONOSCOPY WITH PROPOFOL ;  Surgeon: Eartha Angelia Sieving, MD;   [facility];  Service: Gastroenterology;  Laterality: N/A;  945 ASA 3   DORSAL COMPARTMENT RELEASE Right 04/07/2013   Procedure: RIGHT WRIST STS RELEASE;  Surgeon: Donnice DELENA Robinsons, MD;  Location: [facility];  Service: Orthopedics;  Laterality: Right;   ENDOVENOUS ABLATION SAPHENOUS VEIN W/ LASER Right 05/29/2019   endovenous laser ablation right greater saphenous vein by Medford Blade MD    EXPLORATION OF INCISION FOR CSF LEAK  DEC 2011  X2   POST LAMINECOTMY   FINGER ARTHRODESIS Right 04/07/2013   Procedure: RIGHT INDEX AND RIGHT LONG DISTAL INTERPHALANGEAL JOINT FUSIONS;  Surgeon: Donnice DELENA Robinsons, MD;  Location: [facility];  Service: Orthopedics;  Laterality: Right;   FINGER ARTHROPLASTY Right 04/07/2013   Procedure: RIGHT THUMB CMC ARTHROPLASTY;  Surgeon: Donnice DELENA Robinsons, MD;  Location: [facility];  Service: Orthopedics;  Laterality: Right;   GANGLION CYST EXCISION Right 04/07/2013   Procedure: RIGHT WRIST MASS EXCISION;  Surgeon: Donnice DELENA Robinsons, MD;  Location: [facility];  Service: Orthopedics;  Laterality: Right;   KNEE ARTHROSCOPY  LEFT X3 (LAST ONE 2005)   KNEE ARTHROSCOPY  05/17/2011   Procedure: ARTHROSCOPY KNEE;  Surgeon: Norleen LITTIE Hoard III;  Location: [facility];  Service: Orthopedics;  Laterality: Right;  WITH MEDIAL MENISECTOMY AND removal of suprapatella fat lump   LEFT WRIST TENOSYNECTOMY W/ LEFT THUMB JOINT REPAIR  02-24-10   LUMBAR FUSION  11-30-10   L4 - 5   LUMBAR FUSION  2000   L2 - 4   LUMBAR LAMINECTOMY  DEC 2011   L4 - 5   POSTERIOR CERVICAL FUSION/FORAMINOTOMY N/A 05/17/2017   Procedure: Cervical five  to Thorastic one  Posterior cervical fusion with lateral mass screws/revision of prior instrumentation;  Surgeon: Unice Pac, MD;  Location: [facility];  Service: Neurosurgery;  Laterality:  N/A;  C5 to T1 Posterior cervical fusion with lateral mass screws/revision of prior  instrumentation   SHOULDER ARTHROSCOPY WITH ROTATOR CUFF REPAIR Right 02/01/2024   Procedure: ARTHROSCOPY, SHOULDER, WITH ROTATOR CUFF REPAIR;  Surgeon: Sharl Selinda Dover, MD;  Location: [facility];  Service: Orthopedics;  Laterality: Right;   TENDON REPAIR  JAN 2010   LEFT INDEX AND LONG FINGERS   THUMB SURGERY   04/07/2019   RIGHT THUMB   TUBAL LIGATION     Past Medical History:  Diagnosis Date   Acute sinusitis, unspecified    Allergy     Anemia    quite a few times   Anxiety state, unspecified    Arachnoiditis BILATERAL LEGS   DUE TO MULTIPLE BACK SURG.'S   Arthritis    Asthma    last flare up was 03/2017 lasted over a month   Blood transfusion    Cancer (HCC)    skin cancers (in scalp)   Cardiomyopathy HX --06/2010   EF was 25% during acute illness (PHELONEPHRITIS) Repeat echo 12-06-10 60% showed normal EF.    Chronic back pain greater than 3 months duration    S/P BACK SURG'S   CSF leak    Diabetes mellitus without complication (HCC)    dx 2013 type 2   Dyslipidemia    Dysphagia    some post op cerv fusion 2/14   Dysrhythmia    Essential hypertension, benign    Family history of adverse reaction to anesthesia    mother gets n/v   Family history of breast cancer 02/06/2022   Family history of liver cancer 02/06/2022   Family history of lung cancer 02/06/2022   Family history of ovarian cancer 02/06/2022   GERD (gastroesophageal reflux disease) AND HIATIAL HERNIA   CONTROLLED W/ NEXIUM   Headache(784.0)    History of chronic bronchitis    Hx of bladder infections    Hyperlipidemia    TAKE CHLOSTEROL MEDICATION,04/23/19   Hypertension    Neuromuscular disorder (HCC)    numbness and tingling   Osteoporosis    Other malaise and fatigue    PONV (postoperative nausea and vomiting)    Shortness of breath    Spinal headache    Spinal stenosis, cervical region    Varicosities    venous   Weakness of both legs DUE TO ARACHNOIDITIS   OCCASIONAL  USES CANE   There were no vitals taken for this visit.  Opioid Risk Score:   Fall Risk Score:  `1  Depression screen [facility] 2/9     06/26/2024   12:47 PM 01/28/2024    1:01 PM 11/29/2023    1:30 PM 10/30/2023    1:38 PM 09/25/2023    1:54 PM 08/31/2023    1:44 PM 07/05/2023    2:51 PM  Depression screen PHQ 2/9  Decreased Interest 0 0 0 0 0 1 0  Down, Depressed, Hopeless 0 0 0 0 0 1 0  PHQ - 2 Score 0 0 0 0 0 2 0     Review of Systems  Musculoskeletal:  Positive for back pain.       Right shoulder pain B/L back of leg pain Right front of leg pain  All other systems reviewed and are negative.      Objective:   Physical Exam        Assessment & Plan:  1.  On 05/17/2017 :C5C6C7 T1 Posterior Cervical Fusion with lateral mass fixation with AIRO Imaging, revision of prior  instrumentation. By Dr. Unice.  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 Continue current medication regimen with  Gabapentin  800 mg TID. Refilled: Oxycodone  15mg  one tablet 5 times a day  as needed for pain # 150 tablets We will continue the opioid monitoring program, this consists of regular clinic visits, examinations, urine drug screen, pill counts as well as use of [place]  Controlled Substance Reporting system. 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 Continue to Monitor. 06/26/2024 3. Anxiety/depression: PCP Following. Continue to monitor. 06/26/2024. 4. Muscle Spasms: Continue current medication regime with Flexeril . Continue to Monitor. 06/26/2024 5. Cervicalgia/ Cervical Radiculitis: Dr Unice Following. ,Continue current medication regime with Gabapentin :  S/P  C5C6C7 T1 Posterior Cervical Fusion with lateral mass fixation with AIRO Imaging, revision of prior instrumentation by Dr.  Unice on 05/17/2017. 06/26/2024 7. Bilateral Thoracic Back Pain: Continue HEP as Tolerated and Continue current medication regimen. Continue to Monitor. 06/26/2024  8. Left lower extremity DVT/ Phlebitis:  S/P endovenous laser ablation of left great saphenous vein on 05/09/2018 by Dr. Eliza:  Vascular  Following. 06/26/2024. 9. Insomnia: Continue Pamelor . Continue to Monitor. 06/26/2024. 10.Right Shoulder Pain / Right Shoulder Tendonitis: [facility] Following.S/P: on 02/01/2024  ARTHROSCOPY, SHOULDER, WITH ROTATOR CUFF REPAIR Right Choice  TENODESIS, BICEPS      Continue to monitor.  06/26/2024. 11. Bilateral Foot Drop: She will purchase Bilateral Foot Up Brace: She was instructed to F/U with Dr. Colon, she verbalizes understanding. She has her scheduled appointment with Neurosurgery on 07/16/2024, she reports.    F/U in 1 month    "

## 2024-07-30 ENCOUNTER — Ambulatory Visit (HOSPITAL_COMMUNITY)

## 2024-07-30 ENCOUNTER — Other Ambulatory Visit (HOSPITAL_COMMUNITY)

## 2024-08-20 ENCOUNTER — Other Ambulatory Visit (HOSPITAL_COMMUNITY)

## 2024-08-20 ENCOUNTER — Ambulatory Visit (HOSPITAL_COMMUNITY)

## 2024-08-25 ENCOUNTER — Encounter: Admitting: Registered Nurse

## 2024-08-28 ENCOUNTER — Ambulatory Visit: Admitting: Internal Medicine

## 2024-10-02 ENCOUNTER — Ambulatory Visit: Admitting: Cardiology
